# Patient Record
Sex: Female | Born: 1985 | Race: White | Hispanic: No | Marital: Married | Smoking: Current every day smoker
Health system: Southern US, Community
[De-identification: ages and names within clinical notes are randomized; demographics above are authoritative.]

## PROBLEM LIST (undated history)

## (undated) ENCOUNTER — Ambulatory Visit (HOSPITAL_COMMUNITY): Admission: EM | Payer: Medicaid Other | Source: Home / Self Care

## (undated) DIAGNOSIS — M542 Cervicalgia: Secondary | ICD-10-CM

## (undated) DIAGNOSIS — F419 Anxiety disorder, unspecified: Secondary | ICD-10-CM

## (undated) DIAGNOSIS — F429 Obsessive-compulsive disorder, unspecified: Secondary | ICD-10-CM

## (undated) DIAGNOSIS — R443 Hallucinations, unspecified: Secondary | ICD-10-CM

## (undated) DIAGNOSIS — M797 Fibromyalgia: Secondary | ICD-10-CM

## (undated) DIAGNOSIS — F329 Major depressive disorder, single episode, unspecified: Secondary | ICD-10-CM

## (undated) DIAGNOSIS — F209 Schizophrenia, unspecified: Secondary | ICD-10-CM

## (undated) DIAGNOSIS — J189 Pneumonia, unspecified organism: Secondary | ICD-10-CM

## (undated) DIAGNOSIS — T43291A Poisoning by other antidepressants, accidental (unintentional), initial encounter: Secondary | ICD-10-CM

## (undated) DIAGNOSIS — G894 Chronic pain syndrome: Secondary | ICD-10-CM

## (undated) DIAGNOSIS — G43909 Migraine, unspecified, not intractable, without status migrainosus: Secondary | ICD-10-CM

## (undated) DIAGNOSIS — R110 Nausea: Secondary | ICD-10-CM

## (undated) DIAGNOSIS — M549 Dorsalgia, unspecified: Secondary | ICD-10-CM

## (undated) DIAGNOSIS — J309 Allergic rhinitis, unspecified: Secondary | ICD-10-CM

## (undated) DIAGNOSIS — J45909 Unspecified asthma, uncomplicated: Secondary | ICD-10-CM

## (undated) DIAGNOSIS — R569 Unspecified convulsions: Secondary | ICD-10-CM

## (undated) DIAGNOSIS — F319 Bipolar disorder, unspecified: Secondary | ICD-10-CM

## (undated) DIAGNOSIS — Z9889 Other specified postprocedural states: Secondary | ICD-10-CM

## (undated) DIAGNOSIS — Z8489 Family history of other specified conditions: Secondary | ICD-10-CM

## (undated) DIAGNOSIS — F32A Depression, unspecified: Secondary | ICD-10-CM

## (undated) DIAGNOSIS — J9601 Acute respiratory failure with hypoxia: Secondary | ICD-10-CM

## (undated) DIAGNOSIS — J339 Nasal polyp, unspecified: Secondary | ICD-10-CM

## (undated) DIAGNOSIS — N83209 Unspecified ovarian cyst, unspecified side: Secondary | ICD-10-CM

## (undated) DIAGNOSIS — F603 Borderline personality disorder: Secondary | ICD-10-CM

## (undated) DIAGNOSIS — G8929 Other chronic pain: Secondary | ICD-10-CM

## (undated) DIAGNOSIS — M1990 Unspecified osteoarthritis, unspecified site: Secondary | ICD-10-CM

## (undated) DIAGNOSIS — R112 Nausea with vomiting, unspecified: Secondary | ICD-10-CM

## (undated) HISTORY — DX: Allergic rhinitis, unspecified: J30.9

## (undated) HISTORY — DX: Migraine, unspecified, not intractable, without status migrainosus: G43.909

## (undated) HISTORY — PX: TUBAL LIGATION: SHX77

## (undated) HISTORY — DX: Poisoning by other antidepressants, accidental (unintentional), initial encounter: T43.291A

## (undated) HISTORY — DX: Acute respiratory failure with hypoxia: J96.01

## (undated) HISTORY — PX: ABDOMINAL HYSTERECTOMY: SHX81

## (undated) HISTORY — PX: WISDOM TOOTH EXTRACTION: SHX21

## (undated) HISTORY — DX: Nasal polyp, unspecified: J33.9

## (undated) HISTORY — DX: Dorsalgia, unspecified: M54.9

## (undated) HISTORY — PX: NASAL SINUS SURGERY: SHX719

## (undated) HISTORY — DX: Unspecified ovarian cyst, unspecified side: N83.209

## (undated) HISTORY — DX: Other chronic pain: G89.29

## (undated) HISTORY — DX: Cervicalgia: M54.2

## (undated) HISTORY — DX: Obsessive-compulsive disorder, unspecified: F42.9

## (undated) HISTORY — PX: OTHER SURGICAL HISTORY: SHX169

---
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
## 2004-11-11 ENCOUNTER — Emergency Department (HOSPITAL_COMMUNITY): Admission: EM | Admit: 2004-11-11 | Discharge: 2004-11-11 | Payer: Self-pay | Admitting: Family Medicine

## 2007-09-07 ENCOUNTER — Emergency Department (HOSPITAL_COMMUNITY): Admission: EM | Admit: 2007-09-07 | Discharge: 2007-09-07 | Payer: Self-pay | Admitting: Emergency Medicine

## 2007-09-07 ENCOUNTER — Ambulatory Visit: Payer: Self-pay | Admitting: *Deleted

## 2007-09-07 ENCOUNTER — Inpatient Hospital Stay (HOSPITAL_COMMUNITY): Admission: AD | Admit: 2007-09-07 | Discharge: 2007-09-08 | Payer: Self-pay | Admitting: *Deleted

## 2010-07-07 NOTE — Discharge Summary (Signed)
Carmen, Daniels NO.:  [id number]   MEDICAL RECORD NO.:  [id number]          PATIENT TYPE:  IPS   LOCATION:  0606                          FACILITY:  [facility]   PHYSICIAN:  Jasmine Pang, M.D. DATE OF BIRTH:  1985/09/17   DATE OF ADMISSION:  09/07/2007  DATE OF DISCHARGE:  09/08/2007                               DISCHARGE SUMMARY   IDENTIFYING INFORMATION:  A 25 year old Caucasian female, single.  This  is a voluntary admission.   HISTORY OF PRESENT ILLNESS:  First [facility] admission for Carmen Daniels, who  presented after cutting her left forearm with an Exacto knife deep  enough to require suturing in the emergency room.  She denies that there  was any particular triggering stressor causing her to cut at this time  and denies that she had suicidal intent, but she does endorse a history  of mood swings and says she is not exactly sure what triggered the  cutting this time.  She reports that she has been cutting on herself  about one to two times a month ever since she was about 25 years old.  Family was unaware of it because she usually wears long sleeves and only  cuts on her left forearm.  She reports a history of depression with mood  swings, a lot of low energy, and typically will sleep 12 hours per day.  If she gets less than 10 hours of sleep per day she says she feels  terrible.  Even with 10-12 hours per day she still has problems with  lethargy and low energy.  At the same time she experiences quite a bit  of anxiety, has difficulty going out into public, also difficulty  leaving the home, getting very anxious.  Has been working with Dr. Tiajuana Amass, MD, on medications and he wanted to start Lamictal and  Depakote but she was afraid of the side effects.  He has recently  prescribed Xanax 1 mg b.i.d. p.r.n. for anxiety, which she has been  taking.  She drank two beers prior to the laceration and says she  typically drinks one to two beers about twice a  month.  Denies any other  current or past history of substance abuse.  She denies any suicidal  intent.   PAST PSYCHIATRIC HISTORY:  Currently followed as an outpatient by Dr.  Tiajuana Amass and has been previously diagnosed with bipolar disorder  by Dr. Tomasa Rand along with social anxiety and OCD.  She does validate  that she has mood swings with occasional episodes of irritability that  come out of the blue but mostly a lot of lethargy, anhedonia, low  energy.  Was previously on Prozac for a 87-month trial but says that it  did not help her so it was discontinued and at that time she was  prescribed Xanax 1 mg b.i.d. p.r.n., which she is currently taking.  In  the past she has also had trials of Wellbutrin, which made her feel like  a zombie, Lexapro, which did nothing for her, and Zoloft, which made her  sleepy.  She denies any  prior suicide attempts.  Denies suicidal intent.  She has been in counseling in the distant past for several months but is  not currently in a counseling relationship.  She denies any history of  sexual or physical abuse.   SOCIAL HISTORY:  Made good grades in school.  No history of learning  disability.  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  Currently in a supportive relationship with her  boyfriend, who plans to enter the [employer].  She has a two siblings, a  younger sister who is a Consulting civil engineer at [employer] in an older sister who is  graduating from med school at [employer].  Her father is a  retired professor Licensed conveyancer.  Both parents live here in [place].   FAMILY HISTORY:  Father with history of severe mood swings.  Mother with  history of anxiety issues.   MEDICAL HISTORY:  No regular primary care [facility].   CURRENT MEDICAL PROBLEMS:  Laceration of the left arm.   CURRENT MEDICATIONS:  Xanax 1 mg b.i.d. p.r.n. for anxiety, which she  is  using typically once a day.   DRUG ALLERGIES:  CEPHALOSPORINS, which cause anaphylaxis.   PHYSICAL EXAMINATION:  PE done in the emergency room.  The patient had a  for a 4-1/2-inch laceration to her left arm closed with Prolene sutures.  Physical exam is generally normal, a healthy but pale-appearing  Caucasian female in no distress with flat affect.  Normal vital signs.   Diagnostic studies were remarkable for a urine drug screen positive for  benzodiazepines and an alcohol level of 26.   MENTAL STATUS EXAM:  Fully alert female, pleasant, cooperative.  Blunted  affect.  Normal motor.  Speech is soft in tone but otherwise normal.  Gives a coherent history.  Is articulate.  Insight is good.  Gives a  pretty clear description of her symptoms.  Mood is depressed.  Thought  process is logical, coherent.  Insight seems good.  She has been quite  concerned about looking at the side effects of the various medications  and has been talking to her sister, who is in med school, about this.  She denies she ever had suicidal intent before or after cutting and has  no suicidal intent today really to harm herself but admits that she has  had significant episodes of depressed mood.  Her cognition is fully  intact.  No delusional statements made, no signs of internal distraction  or evidence of psychosis.  Denying any substance abuse.   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  AXIS II:  Deferred.  AXIS III: Laceration, left arm, sutured.  AXIS IV: Severe issues with unemployment.  AXIS V:  Current 40, past year not known.   PLAN:  To discharge her today.  Our case manager has talked with her  parents and they are planning to take her home with them for the next  couple of weeks.  We are getting her a counseling appointment and she  will follow up with Dr. Tiajuana Amass also this week.  In addition to  that, after discussing the risks and benefits of the various medications   she has agreed to start Lamictal 25 mg today and she will get her first  dose here today and will take a prescription for 30 mg home.  She can  continue the Xanax but no  prescription was written today.  She will be  discharged today to go home with her parents.      Margaret A. Lorin Picket, N.P.      Jasmine Pang, M.D.  Electronically Signed    MAS/MEDQ  D:  09/08/2007  T:  09/08/2007  Job:  [id number]

## 2010-11-20 LAB — RAPID URINE DRUG SCREEN, HOSP PERFORMED
Amphetamines: NOT DETECTED
Barbiturates: NOT DETECTED
Benzodiazepines: POSITIVE — AB
Cocaine: NOT DETECTED
Opiates: NOT DETECTED
Tetrahydrocannabinol: NOT DETECTED

## 2010-11-20 LAB — URINALYSIS, ROUTINE W REFLEX MICROSCOPIC
Bilirubin Urine: NEGATIVE
Glucose, UA: NEGATIVE
Hgb urine dipstick: NEGATIVE
Ketones, ur: NEGATIVE
Nitrite: NEGATIVE
Protein, ur: NEGATIVE
Specific Gravity, Urine: 1.006
Urobilinogen, UA: 0.2
pH: 6.5

## 2010-11-20 LAB — POCT I-STAT, CHEM 8
BUN: 5 — ABNORMAL LOW
Calcium, Ion: 1.26
Chloride: 104
Creatinine, Ser: 0.8
Glucose, Bld: 69 — ABNORMAL LOW
HCT: 44
Hemoglobin: 15
Potassium: 3.6
Sodium: 142
TCO2: 27

## 2010-11-20 LAB — CBC
HCT: 42.3
Hemoglobin: 14.8
MCV: 89.6
RBC: 4.73
WBC: 6.4

## 2010-11-20 LAB — POCT PREGNANCY, URINE
Operator id: 24446
Preg Test, Ur: NEGATIVE

## 2010-11-20 LAB — DIFFERENTIAL
Eosinophils Absolute: 0.1
Eosinophils Relative: 2
Lymphs Abs: 1.2
Monocytes Relative: 4

## 2010-11-20 LAB — ETHANOL: Alcohol, Ethyl (B): 26 — ABNORMAL HIGH

## 2013-04-04 ENCOUNTER — Other Ambulatory Visit: Payer: Self-pay | Admitting: Neurology

## 2013-04-04 DIAGNOSIS — R519 Headache, unspecified: Secondary | ICD-10-CM

## 2013-04-04 DIAGNOSIS — R51 Headache: Principal | ICD-10-CM

## 2013-04-12 ENCOUNTER — Ambulatory Visit
Admission: RE | Admit: 2013-04-12 | Discharge: 2013-04-12 | Disposition: A | Discharge status: Home / Self Care | Payer: No Typology Code available for payment source | Source: Ambulatory Visit | Attending: Neurology | Admitting: Neurology

## 2013-04-12 ENCOUNTER — Other Ambulatory Visit: Payer: Self-pay | Admitting: Neurology

## 2013-04-12 ENCOUNTER — Other Ambulatory Visit: Payer: Self-pay

## 2013-04-12 DIAGNOSIS — R51 Headache: Principal | ICD-10-CM

## 2013-04-12 DIAGNOSIS — R519 Headache, unspecified: Secondary | ICD-10-CM

## 2013-05-01 ENCOUNTER — Encounter (HOSPITAL_BASED_OUTPATIENT_CLINIC_OR_DEPARTMENT_OTHER): Payer: Self-pay | Admitting: Emergency Medicine

## 2013-05-01 ENCOUNTER — Emergency Department (HOSPITAL_BASED_OUTPATIENT_CLINIC_OR_DEPARTMENT_OTHER)
Admission: EM | Admit: 2013-05-01 | Discharge: 2013-05-02 | Disposition: A | Discharge status: Home / Self Care | Payer: BC Managed Care – PPO | Attending: Emergency Medicine | Admitting: Emergency Medicine

## 2013-05-01 DIAGNOSIS — R197 Diarrhea, unspecified: Secondary | ICD-10-CM | POA: Insufficient documentation

## 2013-05-01 DIAGNOSIS — R109 Unspecified abdominal pain: Secondary | ICD-10-CM | POA: Insufficient documentation

## 2013-05-01 DIAGNOSIS — F411 Generalized anxiety disorder: Secondary | ICD-10-CM | POA: Insufficient documentation

## 2013-05-01 DIAGNOSIS — F319 Bipolar disorder, unspecified: Secondary | ICD-10-CM | POA: Insufficient documentation

## 2013-05-01 DIAGNOSIS — R112 Nausea with vomiting, unspecified: Secondary | ICD-10-CM | POA: Insufficient documentation

## 2013-05-01 DIAGNOSIS — F17200 Nicotine dependence, unspecified, uncomplicated: Secondary | ICD-10-CM | POA: Insufficient documentation

## 2013-05-01 DIAGNOSIS — R1110 Vomiting, unspecified: Secondary | ICD-10-CM

## 2013-05-01 DIAGNOSIS — Z3202 Encounter for pregnancy test, result negative: Secondary | ICD-10-CM | POA: Insufficient documentation

## 2013-05-01 HISTORY — DX: Bipolar disorder, unspecified: F31.9

## 2013-05-01 HISTORY — DX: Nausea: R11.0

## 2013-05-01 HISTORY — DX: Anxiety disorder, unspecified: F41.9

## 2013-05-01 LAB — BASIC METABOLIC PANEL
BUN: 13 mg/dL (ref 6–23)
CO2: 20 mEq/L (ref 19–32)
CREATININE: 0.8 mg/dL (ref 0.50–1.10)
Calcium: 9.2 mg/dL (ref 8.4–10.5)
Chloride: 99 mEq/L (ref 96–112)
Glucose, Bld: 135 mg/dL — ABNORMAL HIGH (ref 70–99)
POTASSIUM: 3.5 meq/L — AB (ref 3.7–5.3)
Sodium: 136 mEq/L — ABNORMAL LOW (ref 137–147)

## 2013-05-01 MED ORDER — DICYCLOMINE HCL 10 MG/ML IM SOLN
20.0000 mg | Freq: Once | INTRAMUSCULAR | Status: AC
  Administered 2013-05-02: 20 mg via INTRAMUSCULAR
  Filled 2013-05-01: qty 2

## 2013-05-01 MED ORDER — SODIUM CHLORIDE 0.9 % IV SOLN: Freq: Once | INTRAVENOUS | Status: DC

## 2013-05-01 MED ORDER — ONDANSETRON HCL 4 MG/2ML IJ SOLN
4.0000 mg | Freq: Once | INTRAMUSCULAR | Status: AC
  Administered 2013-05-01: 4 mg via INTRAVENOUS
  Filled 2013-05-01: qty 2

## 2013-05-01 MED ORDER — PROMETHAZINE HCL 25 MG/ML IJ SOLN
25.0000 mg | Freq: Once | INTRAMUSCULAR | Status: AC
  Administered 2013-05-01: 25 mg via INTRAVENOUS
  Filled 2013-05-01: qty 1

## 2013-05-01 MED ORDER — KETOROLAC TROMETHAMINE 30 MG/ML IJ SOLN
30.0000 mg | Freq: Once | INTRAMUSCULAR | Status: AC
  Administered 2013-05-02: 30 mg via INTRAVENOUS
  Filled 2013-05-01: qty 2

## 2013-05-01 MED ORDER — SODIUM CHLORIDE 0.9 % IV SOLN
Freq: Once | INTRAVENOUS | Status: AC
  Administered 2013-05-01: 23:00:00 via INTRAVENOUS

## 2013-05-01 MED ORDER — KETOROLAC TROMETHAMINE 15 MG/ML IJ SOLN
INTRAMUSCULAR | Status: AC
  Administered 2013-05-01: 30 mg
  Filled 2013-05-01: qty 1

## 2013-05-01 MED ORDER — SODIUM CHLORIDE 0.9 % IV SOLN
Freq: Once | INTRAVENOUS | Status: AC
  Administered 2013-05-01: 1000 mL via INTRAVENOUS

## 2013-05-01 NOTE — ED Notes (Addendum)
n/v/d/ since 6am-generalized pain-was taking flagyl thru last night for BV

## 2013-05-01 NOTE — ED Notes (Signed)
Pt went to bathroom to collect urine specimen but she was not able to void.  Is now having a "panic attack" and is requesting Xanax, which she sts is what she usually takes.

## 2013-05-01 NOTE — ED Notes (Signed)
Pt denies urge to void at this time-triage delayed due to pt in ED [facility]-states she had episode diarrhea

## 2013-05-01 NOTE — ED Provider Notes (Signed)
CSN: [id number]     Arrival date & time 05/01/13  2157 History  This chart was scribed for Murna Backer Alfonso Patten, MD by Maree Erie, ED Scribe. The patient was seen in room MH12/MH12. Patient's care was started at 11:33 PM.    Chief Complaint  Patient presents with  . Emesis     Patient is a 28 y.o. female presenting with vomiting. The history is provided by the patient. No language interpreter was used.  Emesis Duration:  1 day Timing:  Constant Number of daily episodes:  5+ Quality:  Stomach contents Progression:  Improving Chronicity:  New Relieved by:  Antiemetics Associated symptoms: abdominal pain and diarrhea   Diarrhea:    Quality:  Watery   Number of occurrences:  5+   Severity:  Severe   Timing:  Constant   Progression:  Unchanged Risk factors: no suspect food intake     HPI Comments: ARLIS EVERLY is a 28 y.o. female who presents to the Emergency Department complaining of constant emesis that began this morning. She has associated diarrhea and abdominal pain. She states the episodes of each were "too numerous to count" but reports it was at least over five for each. She reports relief from emesis with the Zofran given in the ED. She denies fever. She denies sick contacts at home. Patient is requesting medication for her anxiety.   Past Medical History  Diagnosis Date  . Bipolar disorder   . Anxiety   . Nausea    Past Surgical History  Procedure Laterality Date  . Nasal sinus surgery     No family history on file. History  Substance Use Topics  . Smoking status: Current Every Day Smoker  . Smokeless tobacco: Not on file  . Alcohol Use: Yes   OB History   Grav Para Term Preterm Abortions TAB SAB Ect Mult Living                 Review of Systems  Constitutional: Negative for fever.  Gastrointestinal: Positive for nausea, vomiting, abdominal pain and diarrhea.  Genitourinary: Negative for dysuria.  Psychiatric/Behavioral: The patient is  nervous/anxious.   All other systems reviewed and are negative.      Allergies  Flagyl and Keflex  Home Medications   Current Outpatient Rx  Name  Route  Sig  Dispense  Refill  . ALPRAZOLAM PO   Oral   Take by mouth.         . ARIPiprazole (ABILIFY PO)   Oral   Take by mouth.         Marland Kitchen HYDROcodone-acetaminophen (NORCO) 10-325 MG per tablet   Oral   Take 1 tablet by mouth every 6 (six) hours as needed.         Marland Kitchen HYDROXYZINE HCL PO   Oral   Take by mouth.         Marland Kitchen PROMETHAZINE HCL PO   Oral   Take by mouth.         . SUMATRIPTAN SUCCINATE PO   Oral   Take by mouth.         . Topiramate (TOPAMAX PO)   Oral   Take by mouth.          Triage Vitals: BP 114/61  Pulse 114  Temp(Src) 98.9 F (37.2 C) (Oral)  Resp 18  Ht 5\' 11"  (1.803 m)  Wt 160 lb (72.576 kg)  BMI 22.33 kg/m2  SpO2 100%  LMP 04/12/2013  Physical Exam  Nursing  note and vitals reviewed. Constitutional: She is oriented to person, place, and time. She appears well-developed and well-nourished. No distress.  HENT:  Head: Normocephalic and atraumatic.  Mouth/Throat: Oropharynx is clear and moist. No oropharyngeal exudate.  Eyes: EOM are normal. Pupils are equal, round, and reactive to light.  Neck: Normal range of motion. Neck supple.  Cardiovascular: Normal rate and regular rhythm.   Pulmonary/Chest: Effort normal and breath sounds normal. No respiratory distress. She has no wheezes. She has no rales.  Abdominal: Soft. She exhibits no distension. Bowel sounds are increased. There is no tenderness. There is no rebound and no guarding.  Musculoskeletal: Normal range of motion. She exhibits no edema.  Neurological: She is alert and oriented to person, place, and time.  Skin: Skin is warm and dry.  Psychiatric: Her mood appears anxious.    ED Course  Procedures (including critical care time)  DIAGNOSTIC STUDIES: Oxygen Saturation is 100% on room air, normal by my interpretation.     COORDINATION OF CARE: 11:36 PM - Patient verbalizes understanding and agrees with treatment plan.    Labs Review Labs Reviewed  URINALYSIS, ROUTINE W REFLEX MICROSCOPIC  PREGNANCY, URINE   Imaging Review No results found.   EKG Interpretation None      MDM   Final diagnoses:  None  Exam and vitals and labs consistent with viral n/v/d.  No indication for imaging at this time  Vomiting has stopped have advised diet for diarrhea.    I personally performed the services described in this documentation, which was scribed in my presence. The recorded information has been reviewed and is accurate.     Carlisle Beers, MD 05/02/13 [phone]

## 2013-05-01 NOTE — ED Notes (Signed)
N/v diarrhea onset this am,  abd pain, denies burning w urination,  Denies vag discharge,  Generalized body aches

## 2013-05-02 LAB — URINALYSIS, ROUTINE W REFLEX MICROSCOPIC
Bilirubin Urine: NEGATIVE
Glucose, UA: NEGATIVE mg/dL
Hgb urine dipstick: NEGATIVE
KETONES UR: 15 mg/dL — AB
NITRITE: NEGATIVE
PH: 6 (ref 5.0–8.0)
Protein, ur: NEGATIVE mg/dL
Specific Gravity, Urine: 1.022 (ref 1.005–1.030)
Urobilinogen, UA: 0.2 mg/dL (ref 0.0–1.0)

## 2013-05-02 LAB — URINE MICROSCOPIC-ADD ON

## 2013-05-02 LAB — PREGNANCY, URINE: PREG TEST UR: NEGATIVE

## 2013-05-02 MED ORDER — ONDANSETRON 8 MG PO TBDP: ORAL_TABLET | ORAL | Status: DC

## 2013-06-14 ENCOUNTER — Emergency Department (HOSPITAL_BASED_OUTPATIENT_CLINIC_OR_DEPARTMENT_OTHER)
Admission: EM | Admit: 2013-06-14 | Discharge: 2013-06-14 | Disposition: A | Discharge status: Home / Self Care | Payer: BC Managed Care – PPO | Attending: Emergency Medicine | Admitting: Emergency Medicine

## 2013-06-14 ENCOUNTER — Encounter (HOSPITAL_BASED_OUTPATIENT_CLINIC_OR_DEPARTMENT_OTHER): Payer: Self-pay | Admitting: Emergency Medicine

## 2013-06-14 DIAGNOSIS — R110 Nausea: Secondary | ICD-10-CM | POA: Insufficient documentation

## 2013-06-14 DIAGNOSIS — F319 Bipolar disorder, unspecified: Secondary | ICD-10-CM | POA: Insufficient documentation

## 2013-06-14 DIAGNOSIS — F17200 Nicotine dependence, unspecified, uncomplicated: Secondary | ICD-10-CM | POA: Insufficient documentation

## 2013-06-14 DIAGNOSIS — Z3202 Encounter for pregnancy test, result negative: Secondary | ICD-10-CM | POA: Insufficient documentation

## 2013-06-14 DIAGNOSIS — J029 Acute pharyngitis, unspecified: Secondary | ICD-10-CM | POA: Insufficient documentation

## 2013-06-14 DIAGNOSIS — F411 Generalized anxiety disorder: Secondary | ICD-10-CM | POA: Insufficient documentation

## 2013-06-14 LAB — CBC WITH DIFFERENTIAL/PLATELET
Basophils Absolute: 0 10*3/uL (ref 0.0–0.1)
Basophils Relative: 0 % (ref 0–1)
EOS PCT: 1 % (ref 0–5)
Eosinophils Absolute: 0.1 10*3/uL (ref 0.0–0.7)
HEMATOCRIT: 38 % (ref 36.0–46.0)
HEMOGLOBIN: 13.3 g/dL (ref 12.0–15.0)
LYMPHS ABS: 1.2 10*3/uL (ref 0.7–4.0)
LYMPHS PCT: 12 % (ref 12–46)
MCH: 31.8 pg (ref 26.0–34.0)
MCHC: 35 g/dL (ref 30.0–36.0)
MCV: 90.9 fL (ref 78.0–100.0)
MONO ABS: 0.6 10*3/uL (ref 0.1–1.0)
MONOS PCT: 6 % (ref 3–12)
Neutro Abs: 7.8 10*3/uL — ABNORMAL HIGH (ref 1.7–7.7)
Neutrophils Relative %: 81 % — ABNORMAL HIGH (ref 43–77)
Platelets: 227 10*3/uL (ref 150–400)
RBC: 4.18 MIL/uL (ref 3.87–5.11)
RDW: 12.6 % (ref 11.5–15.5)
WBC: 9.6 10*3/uL (ref 4.0–10.5)

## 2013-06-14 LAB — COMPREHENSIVE METABOLIC PANEL
ALT: 22 U/L (ref 0–35)
AST: 25 U/L (ref 0–37)
Albumin: 4.5 g/dL (ref 3.5–5.2)
Alkaline Phosphatase: 54 U/L (ref 39–117)
BUN: 9 mg/dL (ref 6–23)
CALCIUM: 9.4 mg/dL (ref 8.4–10.5)
CO2: 20 meq/L (ref 19–32)
CREATININE: 0.7 mg/dL (ref 0.50–1.10)
Chloride: 105 mEq/L (ref 96–112)
GLUCOSE: 75 mg/dL (ref 70–99)
Potassium: 4 mEq/L (ref 3.7–5.3)
SODIUM: 139 meq/L (ref 137–147)
Total Bilirubin: 1.2 mg/dL (ref 0.3–1.2)
Total Protein: 7.4 g/dL (ref 6.0–8.3)

## 2013-06-14 LAB — URINALYSIS, ROUTINE W REFLEX MICROSCOPIC
BILIRUBIN URINE: NEGATIVE
Glucose, UA: NEGATIVE mg/dL
HGB URINE DIPSTICK: NEGATIVE
Ketones, ur: NEGATIVE mg/dL
Leukocytes, UA: NEGATIVE
Nitrite: NEGATIVE
PH: 7.5 (ref 5.0–8.0)
Protein, ur: NEGATIVE mg/dL
SPECIFIC GRAVITY, URINE: 1.003 — AB (ref 1.005–1.030)
Urobilinogen, UA: 0.2 mg/dL (ref 0.0–1.0)

## 2013-06-14 LAB — PREGNANCY, URINE: Preg Test, Ur: NEGATIVE

## 2013-06-14 LAB — RAPID STREP SCREEN (MED CTR MEBANE ONLY): Streptococcus, Group A Screen (Direct): NEGATIVE

## 2013-06-14 LAB — ACETAMINOPHEN LEVEL: Acetaminophen (Tylenol), Serum: <15 ug/mL (ref 10–30)

## 2013-06-14 LAB — MONONUCLEOSIS SCREEN: Mono Screen: NEGATIVE

## 2013-06-14 MED ORDER — HYDROCODONE-HOMATROPINE 5-1.5 MG/5ML PO SYRP: 5.0000 mL | ORAL_SOLUTION | Freq: Four times a day (QID) | ORAL | Status: DC | PRN

## 2013-06-14 MED ORDER — SODIUM CHLORIDE 0.9 % IV BOLUS (SEPSIS)
1000.0000 mL | Freq: Once | INTRAVENOUS | Status: AC
  Administered 2013-06-14: 1000 mL via INTRAVENOUS

## 2013-06-14 MED ORDER — ONDANSETRON HCL 4 MG/2ML IJ SOLN
4.0000 mg | Freq: Once | INTRAMUSCULAR | Status: AC
  Administered 2013-06-14: 4 mg via INTRAVENOUS
  Filled 2013-06-14: qty 2

## 2013-06-14 NOTE — Discharge Instructions (Signed)
Pharyngitis °Pharyngitis is redness, pain, and swelling (inflammation) of your pharynx.  °CAUSES  °Pharyngitis is usually caused by infection. Most of the time, these infections are from viruses (viral) and are part of a cold. However, sometimes pharyngitis is caused by bacteria (bacterial). Pharyngitis can also be caused by allergies. Viral pharyngitis may be spread from person to person by coughing, sneezing, and personal items or utensils (cups, forks, spoons, toothbrushes). Bacterial pharyngitis may be spread from person to person by more intimate contact, such as kissing.  °SIGNS AND SYMPTOMS  °Symptoms of pharyngitis include:   °· Sore throat.   °· Tiredness (fatigue).   °· Low-grade fever.   °· Headache. °· Joint pain and muscle aches. °· Skin rashes. °· Swollen lymph nodes. °· Plaque-like film on throat or tonsils (often seen with bacterial pharyngitis). °DIAGNOSIS  °Your health care provider will ask you questions about your illness and your symptoms. Your medical history, along with a physical exam, is often all that is needed to diagnose pharyngitis. Sometimes, a rapid strep test is done. Other lab tests may also be done, depending on the suspected cause.  °TREATMENT  °Viral pharyngitis will usually get better in 3 4 days without the use of medicine. Bacterial pharyngitis is treated with medicines that kill germs (antibiotics).  °HOME CARE INSTRUCTIONS  °· Drink enough water and fluids to keep your urine clear or pale yellow.   °· Only take over-the-counter or prescription medicines as directed by your health care provider:   °· If you are prescribed antibiotics, make sure you finish them even if you start to feel better.   °· Do not take aspirin.   °· Get lots of rest.   °· Gargle with 8 oz of salt water (½ tsp of salt per 1 qt of water) as often as every 1 2 hours to soothe your throat.   °· Throat lozenges (if you are not at risk for choking) or sprays may be used to soothe your throat. °SEEK MEDICAL  CARE IF:  °· You have large, tender lumps in your neck. °· You have a rash. °· You cough up green, yellow-brown, or bloody spit. °SEEK IMMEDIATE MEDICAL CARE IF:  °· Your neck becomes stiff. °· You drool or are unable to swallow liquids. °· You vomit or are unable to keep medicines or liquids down. °· You have severe pain that does not go away with the use of recommended medicines. °· You have trouble breathing (not caused by a stuffy nose). °MAKE SURE YOU:  °· Understand these instructions. °· Will watch your condition. °· Will get help right away if you are not doing well or get worse. °Document Released: 02/08/2005 Document Revised: 11/29/2012 Document Reviewed: 10/16/2012 °[employer]® Patient Information ©2014 [employer]. ° °

## 2013-06-14 NOTE — ED Notes (Addendum)
Nausea and vomiting last night dizziness this am. Sore throat x 3 days. States she has been taking Tylenol x 4 po every 6-8 hours for the past 3 days.

## 2013-06-14 NOTE — ED Notes (Signed)
Pt concerned for tylenol poisoning due to taking 4 extra strength tylenol every 4-6 hours x 3 days.

## 2013-06-14 NOTE — ED Provider Notes (Signed)
CSN: [id number]     Arrival date & time 06/14/13  1503 History   First MD Initiated Contact with Patient 06/14/13 1518     Chief Complaint  Patient presents with  . Dizziness  . Nausea     (Consider location/radiation/quality/duration/timing/severity/associated sxs/prior Treatment) Patient is a 28 y.o. female presenting with dizziness.  Dizziness  Pt reports she has had a 'severe' sore throat for the last 3 days, she began to feel nauseated and dizzy yesterday which is not unusual for her. She vomited once last night but none today. She has been taking 2g APAP every 4-6 hours since the sore throat started and now she is concerned about APAP toxicity. She looked up symptoms on WebMD and she 'has all of them'.   Past Medical History  Diagnosis Date  . Bipolar disorder   . Anxiety   . Nausea    Past Surgical History  Procedure Laterality Date  . Nasal sinus surgery     No family history on file. History  Substance Use Topics  . Smoking status: Current Every Day Smoker  . Smokeless tobacco: Not on file  . Alcohol Use: Yes   OB History   Grav Para Term Preterm Abortions TAB SAB Ect Mult Living                 Review of Systems  Neurological: Positive for dizziness.   All other systems reviewed and are negative except as noted in HPI.     Allergies  Flagyl and Keflex  Home Medications   Prior to Admission medications   Medication Sig Start Date End Date Taking? Authorizing Provider  ALPRAZOLAM PO Take by mouth.    Historical Provider, MD  ARIPiprazole (ABILIFY PO) Take by mouth.    Historical Provider, MD  HYDROcodone-acetaminophen (NORCO) 10-325 MG per tablet Take 1 tablet by mouth every 6 (six) hours as needed.    Historical Provider, MD  HYDROXYZINE HCL PO Take by mouth.    Historical Provider, MD  ondansetron (ZOFRAN ODT) 8 MG disintegrating tablet 8mg  ODT q8 hours prn nausea 05/02/13   April K Palumbo-Rasch, MD  PROMETHAZINE HCL PO Take by mouth.    Historical  Provider, MD  SUMATRIPTAN SUCCINATE PO Take by mouth.    Historical Provider, MD  Topiramate (TOPAMAX PO) Take by mouth.    Historical Provider, MD   BP 118/69  Pulse 110  Temp(Src) 98.1 F (36.7 C) (Oral)  Resp 18  Ht 5\' 11"  (1.803 m)  Wt 155 lb (70.308 kg)  BMI 21.63 kg/m2  SpO2 96%  LMP 06/13/2013 Physical Exam  Nursing note and vitals reviewed. Constitutional: She is oriented to person, place, and time. She appears well-developed and well-nourished.  HENT:  Head: Normocephalic and atraumatic.  Mouth/Throat: Oropharyngeal exudate present.  Posterior pharynx red swollen  Eyes: EOM are normal. Pupils are equal, round, and reactive to light. No scleral icterus.  Neck: Normal range of motion. Neck supple.  Cardiovascular: Normal rate, normal heart sounds and intact distal pulses.   Pulmonary/Chest: Effort normal and breath sounds normal.  Abdominal: Bowel sounds are normal. She exhibits no distension. There is no tenderness.  Musculoskeletal: Normal range of motion. She exhibits no edema and no tenderness.  Lymphadenopathy:    She has no cervical adenopathy.  Neurological: She is alert and oriented to person, place, and time. She has normal strength. No cranial nerve deficit or sensory deficit.  Skin: Skin is warm and dry. No rash noted.  Psychiatric: She  has a normal mood and affect.    ED Course  Procedures (including critical care time) Labs Review Labs Reviewed  URINALYSIS, ROUTINE W REFLEX MICROSCOPIC - Abnormal; Notable for the following:    Specific Gravity, Urine 1.003 (*)    All other components within normal limits  CBC WITH DIFFERENTIAL - Abnormal; Notable for the following:    Neutrophils Relative % 81 (*)    Neutro Abs 7.8 (*)    All other components within normal limits  RAPID STREP SCREEN  CULTURE, GROUP A STREP  PREGNANCY, URINE  COMPREHENSIVE METABOLIC PANEL  ACETAMINOPHEN LEVEL  MONONUCLEOSIS SCREEN    Imaging Review No results found.   EKG  Interpretation None      MDM   Final diagnoses:  Pharyngitis    Labs reviewed and neg. No evidence of APAP toxicity. Advised to avoid taking more than recommended dosage. Will d/c with hydrocodone syrup for sore throat. PCP followup.    Charles B. Karle Starch, MD 06/14/13 1701

## 2013-06-16 LAB — CULTURE, GROUP A STREP

## 2014-09-26 ENCOUNTER — Encounter: Payer: Self-pay | Admitting: *Deleted

## 2014-09-26 ENCOUNTER — Encounter: Payer: Self-pay | Admitting: Neurology

## 2014-09-26 ENCOUNTER — Ambulatory Visit (INDEPENDENT_AMBULATORY_CARE_PROVIDER_SITE_OTHER): Payer: BLUE CROSS/BLUE SHIELD | Admitting: Neurology

## 2014-09-26 VITALS — BP 119/67 | HR 77 | Ht 71.0 in | Wt 187.0 lb

## 2014-09-26 DIAGNOSIS — E237 Disorder of pituitary gland, unspecified: Secondary | ICD-10-CM

## 2014-09-26 DIAGNOSIS — G43119 Migraine with aura, intractable, without status migrainosus: Secondary | ICD-10-CM | POA: Diagnosis not present

## 2014-09-26 MED ORDER — SUMATRIPTAN SUCCINATE 100 MG PO TABS: 100.0000 mg | ORAL_TABLET | Freq: Once | ORAL | Status: DC

## 2014-09-26 NOTE — Progress Notes (Signed)
PATIENT: Carmen Daniels DOB: November 09, 1985  Chief Complaint  Patient presents with  . Migraine    She is here with her mother, Manuela Schwartz, to discuss migraine treatment. Reports getting approximately 4 migraines per week.  Her migraines are accompanied by nausea, vomiting, blurred vision and dizziness.  She uses Topamax 175mg  qhs and sumatriptan prn.  The sumatriptan is helpful but she runs out of the 9 tablets per month.     HISTORICAL  Carmen Daniels is a 29 years old right-handed female, accompanied by her mother,, seen in refer by her pain management doctor Dr. Nelva Bush for evaluation of migraines  I have reviewed most recent office note in April 2016, she has past medical history of bipolar disorder, complains of frequent headaches, diffuse body achy pain UDS was negative,   She complains of migraine headaches since 2011, her typical migraine started with tunnel vision, lasting for 10-15 minutes, followed by severe pounding headache with associated light noise sensitivity, sumatriptan 100 mg as needed has been very helpful, but sometimes she require repeat dosage. She is now having 4-5 typical prolonged migraines each week, lasting for 1 day,  Trigger for her migraines are stress, sleep deprivation,  She has tried over-the-counter aspirin, Tylenol, NSAIDs Excedrin Migraine, without helping her headaches, she has seen different neurologists in the past, has tried different preventive medications, including beta blocker, calcium channel blocker without helping, currently she is taking Topamax 25 mg 7 to 8 tablets every night, which is adjusted by her psychologist Dr. 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 No white matter disease or large vessel/lacunar  infarc  REVIEW OF SYSTEMS: Full 14 system review of systems performed and notable only for fatigue, blurred vision, headaches, numbness, weakness, dizziness, insomnia, sleepiness, snoring,  ALLERGIES: Allergies  Allergen Reactions  . Keflex [Cephalexin] Anaphylaxis  . Flagyl [Metronidazole]     HOME MEDICATIONS: Current Outpatient Prescriptions  Medication Sig Dispense Refill  . ALPRAZolam (XANAX) 1 MG tablet Take 1 mg by mouth 4 (four) times daily.  2  . ARIPiprazole (ABILIFY) 30 MG tablet Take 30 mg by mouth daily.  3  . Eszopiclone 3 MG TABS Take 3 mg by mouth at bedtime.  3  . HYDROcodone-acetaminophen (NORCO) 10-325 MG per tablet Take 1 tablet by mouth 2 (two) times daily as needed. for pain  0  . LATUDA 40 MG TABS tablet TAKE 1 TABLET EVERY EVENING WITH FOOD  1  . promethazine (PHENERGAN) 25 MG tablet Take 25 mg by mouth 3 (three) times daily as needed.  0  . QUEtiapine (SEROQUEL) 25 MG tablet Take 25 mg by mouth at bedtime.  2  . SUMAtriptan (IMITREX) 100 MG tablet TAKE 1 TABLET BY MOUTH. MAY TAKE REPEAT WITH 1 TABLET IN TWO HOURS IF NEEDED  5  . topiramate (TOPAMAX) 25 MG tablet TAKE 7-8 TABLETS AT BEDTIME  0     PAST MEDICAL HISTORY: Past Medical History  Diagnosis Date  . Bipolar disorder   . Anxiety   . Nausea   . Neck pain   . Migraines   . Chronic pain   . Back pain   . OCD (obsessive compulsive disorder)     PAST SURGICAL HISTORY: Past Surgical History  Procedure Laterality Date  . Nasal sinus surgery    .  Wisdom tooth extraction      FAMILY HISTORY: Family History  Problem Relation Age of Onset  . Arthritis Maternal Grandmother   . Arthritis Maternal Grandfather   . Depression Paternal Grandmother   . Healthy Father   . Healthy Mother     SOCIAL HISTORY:  History   Social History  . Marital Status: Married    Spouse Name: N/A  . Number of Children: 0  . Years of Education: 14   Occupational History  . Unemployed    Social History  Main Topics  . Smoking status: Current Every Day Smoker -- 0.50 packs/day    Types: Cigarettes  . Smokeless tobacco: Not on file  . Alcohol Use: 0.0 oz/week    0 Standard drinks or equivalent per week     Comment: Occasional alcohol use  . Drug Use: No  . Sexual Activity: Not on file   Other Topics Concern  . Not on file   Social History Narrative   Lives at home alone.   Right-handed.   3 glasses of green tea per day.       PHYSICAL EXAM   Filed Vitals:   09/26/14 1404  BP: 119/67  Pulse: 77  Height: 5\' 11"  (1.803 m)  Weight: 187 lb (84.823 kg)    Not recorded      Body mass index is 26.09 kg/(m^2).  PHYSICAL EXAMNIATION:  Gen: NAD, conversant, well nourised, obese, well groomed                     Cardiovascular: Regular rate rhythm, no peripheral edema, warm, nontender. Eyes: Conjunctivae clear without exudates or hemorrhage Neck: Supple, no carotid bruise. Pulmonary: Clear to auscultation bilaterally   NEUROLOGICAL EXAM:  MENTAL STATUS: Speech:    Speech is normal; fluent and spontaneous with normal comprehension.  Cognition:     Orientation to time, place and person     Normal recent and remote memory     Normal Attention span and concentration     Normal Language, naming, repeating,spontaneous speech     Fund of knowledge   CRANIAL NERVES: CN II: Visual fields are full to confrontation. Fundoscopic exam is normal with sharp discs and no vascular changes. Pupils are round equal and briskly reactive to light. CN III, IV, VI: extraocular movement are normal. No ptosis. CN V: Facial sensation is intact to pinprick in all 3 divisions bilaterally. Corneal responses are intact.  CN VII: Face is symmetric with normal eye closure and smile. CN VIII: Hearing is normal to rubbing fingers CN IX, X: Palate elevates symmetrically. Phonation is normal. CN XI: Head turning and shoulder shrug are intact CN XII: Tongue is midline with normal movements and no  atrophy.  MOTOR: There is no pronator drift of out-stretched arms. Muscle bulk and tone are normal. Muscle strength is normal.  REFLEXES: Reflexes are 2+ and symmetric at the biceps, triceps, knees, and ankles. Plantar responses are flexor.  SENSORY: Intact to light touch, pinprick, position sense, and vibration sense are intact in fingers and toes.  COORDINATION: Rapid alternating movements and fine finger movements are intact. There is no dysmetria on finger-to-nose and heel-knee-shin.    GAIT/STANCE: Posture is normal. Gait is steady with normal steps, base, arm swing, and turning. Heel and toe walking are normal. Tandem gait is normal.  Romberg is absent.   DIAGNOSTIC DATA (LABS, IMAGING, TESTING) - I reviewed patient records, labs, notes, testing and imaging myself where available.   ASSESSMENT  AND PLAN  Carmen Daniels is a 29 y.o. female    Chronic migraine headaches with aura  She has tried and failed multiple different preventive medications in the past, currently is taking Topamax, previously has failed beta blocker, calcium channel blocker  She is on polypharmacy treatment  Preauthorization for Botox as migraine prevention  Sumatriptan as needed Bipolar disorder    Marcial Pacas, M.D. Ph.D.  [facility] [address], [place], [place] [address] Ph: [phone] Fax: [phone]  CC: To Dr. [name], Dr. Nelva Bush, Delfino Lovett

## 2014-10-14 ENCOUNTER — Telehealth: Payer: Self-pay | Admitting: Neurology

## 2014-10-14 NOTE — Telephone Encounter (Signed)
error 

## 2014-10-18 ENCOUNTER — Telehealth: Payer: Self-pay | Admitting: Neurology

## 2014-10-18 NOTE — Telephone Encounter (Signed)
Called patient and to set up botox injection. Left a VM asking her to return my call.

## 2014-11-22 NOTE — Telephone Encounter (Signed)
Called patient again to setup botox apt. Left VM asking her to return call if she would like to schedule.

## 2014-11-24 ENCOUNTER — Emergency Department (HOSPITAL_BASED_OUTPATIENT_CLINIC_OR_DEPARTMENT_OTHER): Payer: BLUE CROSS/BLUE SHIELD

## 2014-11-24 ENCOUNTER — Encounter (HOSPITAL_BASED_OUTPATIENT_CLINIC_OR_DEPARTMENT_OTHER): Payer: Self-pay | Admitting: *Deleted

## 2014-11-24 ENCOUNTER — Emergency Department (HOSPITAL_BASED_OUTPATIENT_CLINIC_OR_DEPARTMENT_OTHER)
Admission: EM | Admit: 2014-11-24 | Discharge: 2014-11-24 | Disposition: A | Discharge status: Home / Self Care | Payer: BLUE CROSS/BLUE SHIELD | Attending: Physician Assistant | Admitting: Physician Assistant

## 2014-11-24 DIAGNOSIS — B370 Candidal stomatitis: Secondary | ICD-10-CM | POA: Diagnosis not present

## 2014-11-24 DIAGNOSIS — G8929 Other chronic pain: Secondary | ICD-10-CM | POA: Insufficient documentation

## 2014-11-24 DIAGNOSIS — Z79899 Other long term (current) drug therapy: Secondary | ICD-10-CM | POA: Insufficient documentation

## 2014-11-24 DIAGNOSIS — G43909 Migraine, unspecified, not intractable, without status migrainosus: Secondary | ICD-10-CM | POA: Diagnosis not present

## 2014-11-24 DIAGNOSIS — R0602 Shortness of breath: Secondary | ICD-10-CM | POA: Diagnosis present

## 2014-11-24 DIAGNOSIS — F419 Anxiety disorder, unspecified: Secondary | ICD-10-CM | POA: Insufficient documentation

## 2014-11-24 DIAGNOSIS — R0789 Other chest pain: Secondary | ICD-10-CM | POA: Diagnosis not present

## 2014-11-24 DIAGNOSIS — J45909 Unspecified asthma, uncomplicated: Secondary | ICD-10-CM | POA: Insufficient documentation

## 2014-11-24 DIAGNOSIS — F319 Bipolar disorder, unspecified: Secondary | ICD-10-CM | POA: Diagnosis not present

## 2014-11-24 DIAGNOSIS — Z720 Tobacco use: Secondary | ICD-10-CM | POA: Diagnosis not present

## 2014-11-24 HISTORY — DX: Unspecified asthma, uncomplicated: J45.909

## 2014-11-24 MED ORDER — IBUPROFEN 800 MG PO TABS
800.0000 mg | ORAL_TABLET | Freq: Once | ORAL | Status: AC
  Administered 2014-11-24: 800 mg via ORAL
  Filled 2014-11-24: qty 1

## 2014-11-24 MED ORDER — NYSTATIN 100000 UNIT/ML MT SUSP: 5.0000 mL | Freq: Four times a day (QID) | OROMUCOSAL | Status: AC

## 2014-11-24 NOTE — ED Provider Notes (Signed)
CSN: [id number]     Arrival date & time 11/24/14  1744 History   First MD Initiated Contact with Patient 11/24/14 2017     Chief Complaint  Patient presents with  . Shortness of Breath  . Chest Pain     (Consider location/radiation/quality/duration/timing/severity/associated sxs/prior Treatment) HPI Comments: Pt is a 29 yo female who presents to the ED with complaint of SOB and chest tightness. Pt reports she was at a trampoline park 3 days ago and fell multiple times, denies LOC or head injury. She states she felt a little sore with some chest tightness the next day. She then reports going to a haunted house 2 days ago and notes breathing in lots of smoke from the fog machines which caused her chest tightness to worsen and she began to feel SOB. She notes seh was seen at [facility] P[facility] and was given a neb tx which she notes did not help at all. Denies fever, chills, headache, cough, sore throat, wheezing, palpitations, abdominal pain, N/V/D, numbness, tingling, weakness. She reports the chest tightness worsened with deep breaths which has resulted in her taking quick small breaths and making her feel SOB.    Past Medical History  Diagnosis Date  . Bipolar disorder ([place])   . Anxiety   . Nausea   . Neck pain   . Migraines   . Chronic pain   . Back pain   . OCD (obsessive compulsive disorder)   . Asthma    Past Surgical History  Procedure Laterality Date  . Nasal sinus surgery    . Wisdom tooth extraction     Family History  Problem Relation Age of Onset  . Arthritis Maternal Grandmother   . Arthritis Maternal Grandfather   . Depression Paternal Grandmother   . Healthy Father   . Healthy Mother    Social History  Substance Use Topics  . Smoking status: Current Every Day Smoker -- 0.50 packs/day    Types: Cigarettes  . Smokeless tobacco: None  . Alcohol Use: 0.0 oz/week    0 Standard drinks or equivalent per week     Comment: Occasional alcohol use   OB History    No data  available     Review of Systems  Constitutional: Positive for diaphoresis.  Respiratory: Positive for chest tightness and shortness of breath.   All other systems reviewed and are negative.     Allergies  Keflex and Flagyl  Home Medications   Prior to Admission medications   Medication Sig Start Date End Date Taking? Authorizing Provider  ALPRAZolam Duanne Moron) 1 MG tablet Take 1 mg by mouth 4 (four) times daily. 09/16/14  Yes Historical Provider, MD  ARIPiprazole (ABILIFY) 30 MG tablet Take 30 mg by mouth daily. 08/27/14  Yes Historical Provider, MD  Eszopiclone 3 MG TABS Take 3 mg by mouth at bedtime. 08/24/14  Yes Historical Provider, MD  HYDROcodone-acetaminophen (NORCO) 10-325 MG per tablet Take 1 tablet by mouth 2 (two) times daily as needed. for pain 09/16/14  Yes Historical Provider, MD  LATUDA 40 MG TABS tablet TAKE 1 TABLET EVERY EVENING WITH FOOD 09/12/14  Yes Historical Provider, MD  promethazine (PHENERGAN) 25 MG tablet Take 25 mg by mouth 3 (three) times daily as needed. 09/16/14  Yes Historical Provider, MD  QUEtiapine (SEROQUEL) 25 MG tablet Take 25 mg by mouth at bedtime. 09/12/14  Yes Historical Provider, MD  SUMAtriptan (IMITREX) 100 MG tablet Take 1 tablet (100 mg total) by mouth once. May repeat in  2 hours if headache persists or recurs. 09/26/14  Yes Marcial Pacas, MD  topiramate (TOPAMAX) 25 MG tablet TAKE 7-8 TABLETS AT BEDTIME 09/12/14  Yes Historical Provider, MD   BP 130/91 mmHg  Pulse 68  Temp(Src) 98.2 F (36.8 C) (Oral)  Resp 20  Ht 5\' 11"  (1.803 m)  Wt 175 lb (79.379 kg)  BMI 24.42 kg/m2  SpO2 100%  LMP 11/14/2014 (Within Days) Physical Exam  Constitutional: She is oriented to person, place, and time. She appears well-developed and well-nourished. No distress.  HENT:  Head: Normocephalic and atraumatic.  Mouth/Throat: Oropharynx is clear and moist.  Few small white lesions noted to soft palate.   Eyes: Conjunctivae and EOM are normal. Right eye exhibits no  discharge. Left eye exhibits no discharge. No scleral icterus.  Neck: Normal range of motion. Neck supple.  Cardiovascular: Normal rate, regular rhythm, normal heart sounds and intact distal pulses.   Pulmonary/Chest: Effort normal and breath sounds normal. No respiratory distress. She has no wheezes. She has no rales. She exhibits tenderness (left chest wall and sterum TTP).  Abdominal: Soft. Bowel sounds are normal. She exhibits no distension and no mass. There is no tenderness. There is no rebound and no guarding.  Musculoskeletal: Normal range of motion. She exhibits no edema or tenderness.  Lymphadenopathy:    She has no cervical adenopathy.  Neurological: She is alert and oriented to person, place, and time.  Skin: Skin is warm and dry. She is not diaphoretic.  Nursing note and vitals reviewed.   ED Course  Procedures (including critical care time) Labs Review Labs Reviewed - No data to display  Imaging Review No results found. I have personally reviewed and evaluated these images and lab results as part of my medical decision-making.   EKG Interpretation   Date/Time:  Sunday November 24 2014 17:56:44 EDT Ventricular Rate:  113 PR Interval:  116 QRS Duration: 76 QT Interval:  346 QTC Calculation: 474 R Axis:   52 Text Interpretation:  Sinus tachycardia Minimal voltage criteria for LVH,  may be normal variant Borderline ECG no acute ischemia Sinus tachycardia  Confirmed by Thomasene Lot, [place] ([id number]) on 11/24/2014 6:02:08 PM     Filed Vitals:   11/24/14 2228  BP: 131/78  Pulse: 78  Temp:   Resp: 18   Meds given in ED:  Medications  ibuprofen (ADVIL,MOTRIN) tablet 800 mg (800 mg Oral Given 11/24/14 2108)    Discharge Medication List as of 11/24/2014 10:27 PM    START taking these medications   Details  nystatin (MYCOSTATIN) 100000 UNIT/ML suspension Take 5 mLs (500,000 Units total) by mouth 4 (four) times daily., Starting 11/24/2014, Until Sun 12/08/14, Print         MDM   Final diagnoses:  Musculoskeletal chest pain  Thrush    Pt presents with chest tightness and SOB. Reports going to trampoline house 3 days ago, beginning to have chest tightness and then breathing in smoke from fog machine at haunted house yesterday which worsened her chest tightness and SOB. No associated sxs. No relief with [facility] tx at [facility]. VSS. Lungs CTAB, left chest wall TTP. White plaque lesions noted on soft palate. CXR negative. EKG showed sinus tachycardia. I suspect that sxs are likely due to musculoskeletal origin and I do not feel that further workup or imaging is warranted at this time. Plan to d/c pt home. Advised pt to follow up with PCP and to take ibuprofen for pain relief. Pt given nystatin for  oral thrush.   Evaluation does not show pathology requring ongoing emergent intervention or admission. Pt is hemodynamically stable and mentating appropriately. Discussed findings/results and plan with patient/guardian, who agrees with plan. All questions answered. Return precautions discussed and outpatient follow up given.      Chesley Noon Rogers, [place] 11/25/14 [place], MD 11/28/14 1110

## 2014-11-24 NOTE — ED Notes (Signed)
Pt reports SOB and chest pain starting after going to [employer] last night- Hx of astha and anxiety. Pt seen [facility] and given neb tx. Pt also reports she was at a trampoline park 3 days ago and ?pulled muscle

## 2014-11-24 NOTE — Discharge Instructions (Signed)
Please take your prescription as prescribed. You may also take Ibuprofen for your chest pain. Please follow up with your primary care provider in 3-4 days. Please return to the Emergency Department if symptoms worsen.

## 2014-12-05 DIAGNOSIS — Z0271 Encounter for disability determination: Secondary | ICD-10-CM

## 2014-12-16 ENCOUNTER — Ambulatory Visit: Payer: BLUE CROSS/BLUE SHIELD | Admitting: Pediatrics

## 2014-12-30 ENCOUNTER — Encounter: Payer: Self-pay | Admitting: Pediatrics

## 2014-12-30 ENCOUNTER — Ambulatory Visit (INDEPENDENT_AMBULATORY_CARE_PROVIDER_SITE_OTHER): Payer: BLUE CROSS/BLUE SHIELD | Admitting: Pediatrics

## 2014-12-30 ENCOUNTER — Encounter (INDEPENDENT_AMBULATORY_CARE_PROVIDER_SITE_OTHER): Payer: Self-pay

## 2014-12-30 VITALS — BP 114/76 | HR 76 | Resp 16

## 2014-12-30 DIAGNOSIS — J339 Nasal polyp, unspecified: Secondary | ICD-10-CM

## 2014-12-30 DIAGNOSIS — J310 Chronic rhinitis: Secondary | ICD-10-CM | POA: Diagnosis not present

## 2014-12-30 DIAGNOSIS — T485X5A Adverse effect of other anti-common-cold drugs, initial encounter: Secondary | ICD-10-CM

## 2014-12-30 DIAGNOSIS — T485X1A Poisoning by other anti-common-cold drugs, accidental (unintentional), initial encounter: Secondary | ICD-10-CM | POA: Diagnosis not present

## 2014-12-30 DIAGNOSIS — J301 Allergic rhinitis due to pollen: Secondary | ICD-10-CM

## 2014-12-30 DIAGNOSIS — J4530 Mild persistent asthma, uncomplicated: Secondary | ICD-10-CM | POA: Insufficient documentation

## 2014-12-30 NOTE — Patient Instructions (Addendum)
Add prednisone 10 mg twice a day for 4 days ,10 mg on Day 5 Stop Afrin nasal spray  Dymista 1 spray per nostril twice a day Qvar 40 one puff twice a day to prevent cough or wheeze Ventolin 2 puffs every 4 hours if needed for wheezing or coughing spells Call me if her asthma is not well controlled or if she cannot get off the Afrin nasal spray

## 2014-12-30 NOTE — Progress Notes (Signed)
  [address] [place] [address] Dept: [phone]  FOLLOW UP NOTE  Patient ID: Carmen Daniels, female    DOB: 02-08-1986  Age: 29 y.o. MRN: [id number] Date of Office Visit: 12/30/2014  Assessment Chief Complaint: Follow-up and Nasal Congestion  HPI [name] presents for follow-up of her nasal congestion. She has been using Afrin nasal spray for a few weeks. Her asthma is well controlled. She is not having headaches She has a history of nasal polyps which required surgery in 2002  She is on Qvar 40-1 puff twice a day, Dymista 1 spray per nostril twice a day, Ventolin 2 puffs every 4 hours if needed. Her other medications are outlined in the chart   Drug Allergies:  Allergies  Allergen Reactions  . Keflex [Cephalexin] Anaphylaxis  . Flagyl [Metronidazole]     Physical Exam: BP 114/76 mmHg  Pulse 76  Resp 16   Physical Exam  Constitutional: She is oriented to person, place, and time. She appears well-developed and well-nourished.  HENT:  Eyes normal. Ears normal. Nose normal. Pharynx normal.  Neck: Neck supple.  Cardiovascular:  S1 and S2 normal no murmurs  Pulmonary/Chest:  Clear to percussion and auscultation  Lymphadenopathy:    She has no cervical adenopathy.  Neurological: She is alert and oriented to person, place, and time.  Vitals reviewed.   Diagnostics:  FVC 4.99 L FEV1 3.60 L. Predicted FVC 4.26 L predicted FEV1 3.55 L-shows a mild reduction in the FEV1 percent  Assessment and Plan: 1. Mild persistent asthma, uncomplicated   2. Allergic rhinitis due to pollen   3. Rhinitis medicamentosa   4. Nasal polyposis     No orders of the defined types were placed in this encounter.    Patient Instructions  Add prednisone 10 mg twice a day for 4 days ,10 mg on Day 5 Stop Afrin nasal spray  Dymista 1 spray per nostril twice a day Qvar 40 one puff twice a day to prevent cough or wheeze Ventolin 2 puffs every 4 hours if needed for wheezing or  coughing spells Call me if her asthma is not well controlled or if she cannot get off the Afrin nasal spray    Return in about 6 months (around 06/29/2015).    Thank you for the opportunity to care for this patient.  Please do not hesitate to contact me with questions.  Penne Lash, M.D.  [facility] [address] [place], [place] [address] [phone]

## 2014-12-31 ENCOUNTER — Encounter: Payer: Self-pay | Admitting: Pediatrics

## 2015-01-09 ENCOUNTER — Other Ambulatory Visit: Payer: Self-pay | Admitting: Pediatrics

## 2015-03-06 ENCOUNTER — Telehealth: Payer: Self-pay | Admitting: Neurology

## 2015-03-06 NOTE — Telephone Encounter (Signed)
Spoke with the patient. She is going to stop by my office on the way out after her apt on 02/02 to give me her new insurance information and schedule botox apt.

## 2015-03-06 NOTE — Telephone Encounter (Signed)
I called the patient. She has had a migraine everyday for the past couple of weeks. She states the Imitrex helps but she has ran out of it. She would like to know if there is anything else she can have for the migraine. She would like a refill for Imitrex if Dr. Krista Blue is going to keep her on it. Dr. Krista Blue tried to initiate Botox for the patient. Danielle tried to call her several times in August, but the patient never returned her call to schedule. The patient stated that she was waiting for her Medicaid. She now has Medicaid and would like to speak to [facility] about Botox. I advised that I would let Andee Poles know and after speaking to Dr. Krista Blue about immediate treatment I would call her back.

## 2015-03-06 NOTE — Telephone Encounter (Signed)
Pt's mother called to schedule appt for pt . She is having a migraine everyday for the last 2 day. Please call and [phone]

## 2015-03-07 NOTE — Telephone Encounter (Signed)
Reviewed, agree with BOTOX as preventive medication

## 2015-03-27 ENCOUNTER — Ambulatory Visit (INDEPENDENT_AMBULATORY_CARE_PROVIDER_SITE_OTHER): Payer: BLUE CROSS/BLUE SHIELD | Admitting: Neurology

## 2015-03-27 ENCOUNTER — Encounter: Payer: Self-pay | Admitting: Neurology

## 2015-03-27 VITALS — BP 100/69 | HR 114 | Ht 71.0 in | Wt 166.0 lb

## 2015-03-27 DIAGNOSIS — G43709 Chronic migraine without aura, not intractable, without status migrainosus: Secondary | ICD-10-CM | POA: Insufficient documentation

## 2015-03-27 DIAGNOSIS — G43119 Migraine with aura, intractable, without status migrainosus: Secondary | ICD-10-CM

## 2015-03-27 MED ORDER — SUMATRIPTAN SUCCINATE 100 MG PO TABS: 100.0000 mg | ORAL_TABLET | Freq: Once | ORAL | Status: DC

## 2015-03-27 NOTE — Progress Notes (Signed)
Chief Complaint  Patient presents with  . Migraine    She is here with her mother, Manuela Schwartz to discuss her increase in migraines.  She is taking Topamax 200mg  at bedtime.  Sumatriptan works well as a rescue medication. She would like to proceed with Botox treatment.      PATIENT: Carmen Daniels DOB: 06-03-1985  Chief Complaint  Patient presents with  . Migraine    She is here with her mother, Manuela Schwartz to discuss her increase in migraines.  She is taking Topamax 200mg  at bedtime.  Sumatriptan works well as a rescue medication. She would like to proceed with Botox treatment.     HISTORICAL  Carmen Daniels is a 30 years old right-handed female, accompanied by her mother,, seen in refer by her pain management doctor Dr. Nelva Bush for evaluation of migraines  I have reviewed most recent office note in April 2016, she has past medical history of bipolar disorder, complains of frequent headaches, diffuse body achy pain UDS was negative,   She complains of migraine headaches since 2011, her typical migraine started with tunnel vision, lasting for 10-15 minutes, followed by severe pounding headache with associated light noise sensitivity, sumatriptan 100 mg as needed has been very helpful, but sometimes she require repeat dosage. She is now having 4-5 typical prolonged migraines each week, lasting for 1 day,  Trigger for her migraines are stress, sleep deprivation,  She has tried over-the-counter aspirin, Tylenol, NSAIDs Excedrin Migraine, without helping her headaches, she has seen different neurologists in the past, has tried different preventive medications, including beta blocker, calcium channel blocker without helping, currently she is taking Topamax 25 mg 7 to 8 tablets every night, which is adjusted by her psychologist Dr. 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 No white matter disease or large vessel/lacunar infarction.  UPDATE Mar 27 2015: She is now taking Topamax 25 mg tablets 8 tablets every night, still has frequent migraine headaches, also consider Botox injection as migraine prevention, she has variable frequency of migraine, few weeks ago, she had 3 weeks of daily headaches, Imitrex as needed is helpful in 60 minutes, she still has dull achy pain, but no longer has the intense headaches.  used up all 9 tablets of allowed Imitrex each month. She usually has visual aura proceeding each migraine headaches,  She is on polypharmacy for her poor disorder, currently taking Topamax, latuda, seroquel, Xanax, Abilify  REVIEW OF SYSTEMS: Full 14 system review of systems performed and notable only for unexpected weight change, hearing loss, ear pain, ringing ears, runny nose, cough, wheezing, shortness of breath, chest tightness, heat intolerance, diarrhea, nausea, vomiting, insomnia, snoring, frequent infection, joint pain, back pain, achy muscles, neck pain, neck stiffness, bruise easily, dizziness, headaches, numbness, agitations  ALLERGIES: Allergies  Allergen Reactions  . Keflex [Cephalexin] Anaphylaxis  . Flagyl [Metronidazole]     HOME MEDICATIONS: Current Outpatient Prescriptions  Medication Sig Dispense Refill  . ALPRAZolam (XANAX) 1 MG tablet Take 1 mg by mouth 4 (four) times daily.  2  . ARIPiprazole (ABILIFY) 30 MG tablet Take 30 mg by mouth daily.  3  . Eszopiclone 3 MG TABS Take 3 mg by mouth at bedtime.  3  . HYDROcodone-acetaminophen (NORCO) 10-325 MG per tablet Take 1 tablet by mouth 2 (two)  times daily as needed. for pain  0  . LATUDA 40 MG TABS tablet TAKE 1 TABLET EVERY EVENING WITH FOOD  1  . promethazine (PHENERGAN) 25 MG tablet Take 25 mg by mouth 3 (three) times daily as needed.  0  . QUEtiapine (SEROQUEL) 25 MG tablet Take  25 mg by mouth at bedtime.  2  . SUMAtriptan (IMITREX) 100 MG tablet TAKE 1 TABLET BY MOUTH. MAY TAKE REPEAT WITH 1 TABLET IN TWO HOURS IF NEEDED  5  . topiramate (TOPAMAX) 25 MG tablet TAKE 7-8 TABLETS AT BEDTIME  0     PAST MEDICAL HISTORY: Past Medical History  Diagnosis Date  . Bipolar disorder ([place])   . Anxiety   . Nausea   . Neck pain   . Migraines   . Chronic pain   . Back pain   . OCD (obsessive compulsive disorder)   . Asthma   . Nasal polyps   . Allergic rhinitis     PAST SURGICAL HISTORY: Past Surgical History  Procedure Laterality Date  . Nasal sinus surgery    . Wisdom tooth extraction      FAMILY HISTORY: Family History  Problem Relation Age of Onset  . Arthritis Maternal Grandmother   . Arthritis Maternal Grandfather   . Depression Paternal Grandmother   . Healthy Father   . Healthy Mother     SOCIAL HISTORY:  Social History   Social History  . Marital Status: Married    Spouse Name: N/A  . Number of Children: 0  . Years of Education: 14   Occupational History  . Unemployed    Social History Main Topics  . Smoking status: Current Every Day Smoker -- 0.50 packs/day    Types: Cigarettes  . Smokeless tobacco: Not on file  . Alcohol Use: 0.0 oz/week    0 Standard drinks or equivalent per week     Comment: Occasional alcohol use  . Drug Use: No  . Sexual Activity: Yes    Birth Control/ Protection: Condom   Other Topics Concern  . Not on file   Social History Narrative   Lives at home alone.   Right-handed.   3 glasses of green tea per day.       PHYSICAL EXAM   Filed Vitals:   03/27/15 0904  BP: 100/69  Pulse: 114  Height: 5\' 11"  (1.803 m)  Weight: 166 lb (75.297 kg)    Not recorded      Body mass index is 23.16 kg/(m^2).  PHYSICAL EXAMNIATION:  Gen: NAD, conversant, well nourised, obese, well groomed                     Cardiovascular: Regular rate rhythm, no peripheral edema, warm, nontender. Eyes: Conjunctivae  clear without exudates or hemorrhage Neck: Supple, no carotid bruise. Pulmonary: Clear to auscultation bilaterally   NEUROLOGICAL EXAM:  MENTAL STATUS: Speech:    Speech is normal; fluent and spontaneous with normal comprehension.  Cognition:     Orientation to time, place and person     Normal recent and remote memory     Normal Attention span and concentration     Normal Language, naming, repeating,spontaneous speech     Fund of knowledge   CRANIAL NERVES: CN II: Visual fields are full to confrontation. Fundoscopic exam is normal with sharp discs and no vascular changes. Pupils are round equal and briskly reactive to light. CN III, IV, VI: extraocular movement are normal. No ptosis. CN V:  Facial sensation is intact to pinprick in all 3 divisions bilaterally. Corneal responses are intact.  CN VII: Face is symmetric with normal eye closure and smile. CN VIII: Hearing is normal to rubbing fingers CN IX, X: Palate elevates symmetrically. Phonation is normal. CN XI: Head turning and shoulder shrug are intact CN XII: Tongue is midline with normal movements and no atrophy.  MOTOR: There is no pronator drift of out-stretched arms. Muscle bulk and tone are normal. Muscle strength is normal.  REFLEXES: Reflexes are 2+ and symmetric at the biceps, triceps, knees, and ankles. Plantar responses are flexor.  SENSORY: Intact to light touch, pinprick, position sense, and vibration sense are intact in fingers and toes.  COORDINATION: Rapid alternating movements and fine finger movements are intact. There is no dysmetria on finger-to-nose and heel-knee-shin.    GAIT/STANCE: Posture is normal. Gait is steady with normal steps, base, arm swing, and turning. Heel and toe walking are normal. Tandem gait is normal.  Romberg is absent.  DIAGNOSTIC DATA (LABS, IMAGING, TESTING) - I reviewed patient records, labs, notes, testing and imaging myself where available.   ASSESSMENT AND  PLAN  Carmen Daniels is a 30 y.o. female    Chronic migraine headaches with aura  She has tried and failed multiple different preventive medications in the past, currently is taking Topamax, previously has failed beta blocker, calcium channel blocker  She is on polypharmacy treatment latuda, and Abilify, Xanax, seroquel, escopiclone  Preauthorization for Botox as migraine prevention  Sumatriptan as needed Bipolar disorder Neck pain  Most consistent with musculoskeletal etiology, I have advised her neck stretching exercise when necessary and says, heating pad, Botox injection likely will help too.    Marcial Pacas, M.D. Ph.D.  [facility] [address] [place],  [address] Ph: [phone] Fax: [phone]  CC: To Dr. [name], Dr. Nelva Bush, Delfino Lovett

## 2015-04-01 ENCOUNTER — Telehealth: Payer: Self-pay | Admitting: Neurology

## 2015-04-03 NOTE — Telephone Encounter (Signed)
Error

## 2015-04-29 ENCOUNTER — Telehealth: Payer: Self-pay | Admitting: Neurology

## 2015-04-29 NOTE — Telephone Encounter (Signed)
Rx sent in 03/27/15 with 11 additional refills - spoke to patient - she will contact her pharmacy.

## 2015-04-29 NOTE — Telephone Encounter (Signed)
Patient called to request refill of SUMAtriptan (IMITREX) 100 MG tablet, [facility] states patient is out of refills and needs to be renewed.

## 2015-05-05 ENCOUNTER — Ambulatory Visit (INDEPENDENT_AMBULATORY_CARE_PROVIDER_SITE_OTHER): Payer: BLUE CROSS/BLUE SHIELD | Admitting: Neurology

## 2015-05-05 ENCOUNTER — Encounter: Payer: Self-pay | Admitting: Neurology

## 2015-05-05 ENCOUNTER — Other Ambulatory Visit: Payer: Self-pay | Admitting: *Deleted

## 2015-05-05 VITALS — BP 137/90 | HR 102 | Ht 71.0 in | Wt 166.0 lb

## 2015-05-05 DIAGNOSIS — G43719 Chronic migraine without aura, intractable, without status migrainosus: Secondary | ICD-10-CM | POA: Diagnosis not present

## 2015-05-05 MED ORDER — SUMATRIPTAN SUCCINATE 6 MG/0.5ML ~~LOC~~ SOLN: 6.0000 mg | SUBCUTANEOUS | Status: DC | PRN

## 2015-05-05 NOTE — Progress Notes (Signed)
Chief Complaint  Patient presents with  . Migraine    Botox 200 units - specialty pharmacy      PATIENT: Carmen Daniels DOB: 09-26-1985  Chief Complaint  Patient presents with  . Migraine    Botox 200 units - specialty pharmacy     HISTORICAL  Carmen Daniels is a 30 years old right-handed female, accompanied by her mother,, seen in refer by her pain management doctor Dr. Nelva Bush for evaluation of migraines  I have reviewed most recent office note in April 2016, she has past medical history of bipolar disorder, complains of frequent headaches, diffuse body achy pain UDS was negative,   She complains of migraine headaches since 2011, her typical migraine started with tunnel vision, lasting for 10-15 minutes, followed by severe pounding headache with associated light noise sensitivity, sumatriptan 100 mg as needed has been very helpful, but sometimes she require repeat dosage. She is now having 4-5 typical prolonged migraines each week, lasting for 1 day,  Trigger for her migraines are stress, sleep deprivation,  She has tried over-the-counter aspirin, Tylenol, NSAIDs Excedrin Migraine, without helping her headaches, she has seen different neurologists in the past, has tried different preventive medications, including beta blocker, calcium channel blocker without helping, currently she is taking Topamax 25 mg 7 to 8 tablets every night, which is adjusted by her psychologist Dr. 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 No white matter disease or large vessel/lacunar infarction.  UPDATE Mar 27 2015: She is now taking Topamax 25 mg tablets 8 tablets every night, still has frequent migraine headaches, also consider Botox injection as migraine prevention,  she has variable frequency of migraine, few weeks ago, she had 3 weeks of daily headaches, Imitrex as needed is helpful in 60 minutes, she still has dull achy pain, but no longer has the intense headaches.  used up all 9 tablets of allowed Imitrex each month. She usually has visual aura proceeding each migraine headaches,  She is on polypharmacy for her mood disorder, currently taking Topamax, latuda, seroquel, Xanax, Abilify  UPDATE May 05 2015:   REVIEW OF SYSTEMS: Full 14 system review of systems performed and notable only for unexpected weight change, hearing loss, ear pain, ringing ears, runny nose, cough, wheezing, shortness of breath, chest tightness, heat intolerance, diarrhea, nausea, vomiting, insomnia, snoring, frequent infection, joint pain, back pain, achy muscles, neck pain, neck stiffness, bruise easily, dizziness, headaches, numbness, agitations  ALLERGIES: Allergies  Allergen Reactions  . Keflex [Cephalexin] Anaphylaxis  . Flagyl [Metronidazole]     HOME MEDICATIONS: Current Outpatient Prescriptions  Medication Sig Dispense Refill  . ALPRAZolam (XANAX) 1 MG tablet Take 1 mg by mouth 4 (four) times daily.  2  . ARIPiprazole (ABILIFY) 30 MG tablet Take 30 mg by mouth daily.  3  . Eszopiclone 3 MG TABS Take 3 mg by mouth at bedtime.  3  . HYDROcodone-acetaminophen (NORCO) 10-325 MG per tablet Take 1 tablet by mouth 2 (two) times daily as needed. for pain  0  . LATUDA 40 MG TABS tablet TAKE 1 TABLET EVERY EVENING WITH FOOD  1  . promethazine (PHENERGAN) 25 MG tablet Take 25 mg by mouth 3 (three) times daily as needed.  0  . QUEtiapine (SEROQUEL) 25 MG tablet Take 25 mg by mouth  at bedtime.  2  . SUMAtriptan (IMITREX) 100 MG tablet TAKE 1 TABLET BY MOUTH. MAY TAKE REPEAT WITH 1 TABLET IN TWO HOURS IF NEEDED  5  . topiramate (TOPAMAX) 25 MG tablet TAKE 7-8 TABLETS AT BEDTIME  0     PAST MEDICAL HISTORY: Past Medical History  Diagnosis Date  . Bipolar disorder ([place])   .  Anxiety   . Nausea   . Neck pain   . Migraines   . Chronic pain   . Back pain   . OCD (obsessive compulsive disorder)   . Asthma   . Nasal polyps   . Allergic rhinitis     PAST SURGICAL HISTORY: Past Surgical History  Procedure Laterality Date  . Nasal sinus surgery    . Wisdom tooth extraction      FAMILY HISTORY: Family History  Problem Relation Age of Onset  . Arthritis Maternal Grandmother   . Arthritis Maternal Grandfather   . Depression Paternal Grandmother   . Healthy Father   . Healthy Mother     SOCIAL HISTORY:  Social History   Social History  . Marital Status: Married    Spouse Name: N/A  . Number of Children: 0  . Years of Education: 14   Occupational History  . Unemployed    Social History Main Topics  . Smoking status: Current Every Day Smoker -- 0.50 packs/day    Types: Cigarettes  . Smokeless tobacco: Not on file  . Alcohol Use: 0.0 oz/week    0 Standard drinks or equivalent per week     Comment: Occasional alcohol use  . Drug Use: No  . Sexual Activity: Yes    Birth Control/ Protection: Condom   Other Topics Concern  . Not on file   Social History Narrative   Lives at home alone.   Right-handed.   3 glasses of green tea per day.       PHYSICAL EXAM   Filed Vitals:   05/05/15 1205  BP: 137/90  Pulse: 102  Height: 5\' 11"  (1.803 m)  Weight: 166 lb (75.297 kg)    Not recorded      Body mass index is 23.16 kg/(m^2).  PHYSICAL EXAMNIATION:  Gen: NAD, conversant, well nourised, obese, well groomed                     Cardiovascular: Regular rate rhythm, no peripheral edema, warm, nontender. Eyes: Conjunctivae clear without exudates or hemorrhage Neck: Supple, no carotid bruise. Pulmonary: Clear to auscultation bilaterally   NEUROLOGICAL EXAM:  MENTAL STATUS: Speech:    Speech is normal; fluent and spontaneous with normal comprehension.  Cognition:     Orientation to time, place and person     Normal recent and  remote memory     Normal Attention span and concentration     Normal Language, naming, repeating,spontaneous speech     Fund of knowledge   CRANIAL NERVES: CN II: Visual fields are full to confrontation. Fundoscopic exam is normal with sharp discs and no vascular changes. Pupils are round equal and briskly reactive to light. CN III, IV, VI: extraocular movement are normal. No ptosis. CN V: Facial sensation is intact to pinprick in all 3 divisions bilaterally. Corneal responses are intact.  CN VII: Face is symmetric with normal eye closure and smile. CN VIII: Hearing is normal to rubbing fingers CN IX, X: Palate elevates symmetrically. Phonation is normal. CN XI: Head turning and shoulder shrug are intact CN XII: Tongue  is midline with normal movements and no atrophy.  MOTOR: There is no pronator drift of out-stretched arms. Muscle bulk and tone are normal. Muscle strength is normal.  REFLEXES: Reflexes are 2+ and symmetric at the biceps, triceps, knees, and ankles. Plantar responses are flexor.  SENSORY: Intact to light touch, pinprick, position sense, and vibration sense are intact in fingers and toes.  COORDINATION: Rapid alternating movements and fine finger movements are intact. There is no dysmetria on finger-to-nose and heel-knee-shin.    GAIT/STANCE: Posture is normal. Gait is steady with normal steps, base, arm swing, and turning. Heel and toe walking are normal. Tandem gait is normal.  Romberg is absent.  DIAGNOSTIC DATA (LABS, IMAGING, TESTING) - I reviewed patient records, labs, notes, testing and imaging myself where available.   ASSESSMENT AND PLAN  ANAISABEL CALLIES is a 30 y.o. female    Chronic migraine headaches with aura  She has tried and failed multiple different preventive medications in the past, currently is taking Topamax, previously has failed beta blocker, calcium channel blocker  She is on polypharmacy treatment latuda, and Abilify, Xanax, seroquel,  escopiclone  Imitrex tablet 100 mg as needed was helpful but often she has to take multiple dose  I wrote Imitrex injection today  This is her first Botox injection as migraine prevention, potential side effect and procedures procedure was explained, consent form was signed.  BOTOX injection was performed according to protocol by [facility].     Corrugator 2 sites, 10 units Procerus 1 site, 5 unit Frontalis 4 sites,  20 units, Temporalis 8 sites,  40 units  Occipitalis 6 sites, 30 units Cervical Paraspinal, 4 sites, 20 units Trapezius, 6 sites, 30 units Extra 45 units was injected along bilateral cervical paraspinal muscles  Patient tolerate the injection well. Will return for repeat injection in 3 months.  Marcial Pacas, M.D. Ph.D.  [facility] [address], [place], [place] [address] Ph: [phone] Fax: [phone]  CC: To Dr. [name], Dr. Nelva Bush, Delfino Lovett

## 2015-05-05 NOTE — Progress Notes (Signed)
**  Botox 100 units x 2 vials, Lot [id number], Exp 10/2017, [place] [id number], specialty pharmacy**mck,rn.

## 2015-05-08 ENCOUNTER — Other Ambulatory Visit: Payer: Self-pay | Admitting: *Deleted

## 2015-05-08 ENCOUNTER — Telehealth: Payer: Self-pay | Admitting: Neurology

## 2015-05-08 MED ORDER — SUMATRIPTAN SUCCINATE 6 MG/0.5ML ~~LOC~~ SOAJ: SUBCUTANEOUS | Status: DC

## 2015-05-08 NOTE — Telephone Encounter (Signed)
Rx at pharmacy changed to pre-filled auto injections at the pharmacy.  Patient aware.

## 2015-05-08 NOTE — Telephone Encounter (Signed)
Pt is requesting the rx for SUMAtriptan 6 MG/0.5ML SOAJ but needs the injection in prefilled syringes. Pt seemed a little confused about taking the medication and how much is to be in each cartridge. While on the phone she received [facility] from [facility] telling her the medication was ready for pick up. I asked her to call them to make sure it is for the prefilled syringes.

## 2015-05-12 ENCOUNTER — Encounter: Payer: Self-pay | Admitting: *Deleted

## 2015-05-12 ENCOUNTER — Telehealth: Payer: Self-pay | Admitting: Neurology

## 2015-05-12 NOTE — Telephone Encounter (Signed)
Pt called sts she had botox inj 05/05/15. Said she's had migraine everyday since having botox injection. She said Friday (3/17) and Saturday (3/18) night she took SUMAtriptan 6 MG/0.5ML SOAJ . The HA was dull after that.  On Sunday she had bad HA and took SUMAtriptan (IMITREX) 100 MG tablet she took 1 tab about noon but HA didn't get better. She also took 2 aspirin and laid down. Two hours later she took another sumatriptan tablet. She got a nosebleed from 1 nostril but doesn't remember how long after taking the medication. This morning she was making her bed and she had a severe sharp pain in her head, she laid on the floor for about 1-2 minutes, then she got up and her nose was bleeding from both nostrils. The HA is still severe. She has taken SUMAtriptan (IMITREX) 100 MG tablet 1 tab about 5:30am and promethazine for nausea. The HA is dull now but it has not stopped.

## 2015-05-12 NOTE — Telephone Encounter (Signed)
Per Dr. Krista Blue, offer her an infusion of VPA 1 gram.  Spoke to patient - her allergies have been confirmed in her chart and she states she is not pregnant.  She would like to discuss this medication with her mother and will call back if she would like to come in for treatment.

## 2015-05-13 NOTE — Telephone Encounter (Signed)
Spoke to her mother - she will need the appt for Wednesday (pt does not have car or a ride today) - will be here at 2pm on 05/14/14.  Tina ([facility]) aware.

## 2015-05-13 NOTE — Telephone Encounter (Signed)
Mom called to advise daughter would like to schedule VPA infusion. Please call [phone].

## 2015-05-13 NOTE — Telephone Encounter (Signed)
Called to check on her - left message - if she decides she would like the IV, the offer is there for her and is she is feeling better, she does not have to call me back.

## 2015-05-14 ENCOUNTER — Telehealth: Payer: Self-pay | Admitting: Neurology

## 2015-05-14 NOTE — Telephone Encounter (Signed)
Called patient to schedule next injection, left a VM asking her to return my call.

## 2015-05-15 ENCOUNTER — Encounter: Payer: Self-pay | Admitting: *Deleted

## 2015-05-15 ENCOUNTER — Ambulatory Visit (INDEPENDENT_AMBULATORY_CARE_PROVIDER_SITE_OTHER): Payer: BLUE CROSS/BLUE SHIELD | Admitting: *Deleted

## 2015-05-15 ENCOUNTER — Other Ambulatory Visit: Payer: Self-pay | Admitting: *Deleted

## 2015-05-15 ENCOUNTER — Telehealth (INDEPENDENT_AMBULATORY_CARE_PROVIDER_SITE_OTHER): Payer: BLUE CROSS/BLUE SHIELD | Admitting: Neurology

## 2015-05-15 DIAGNOSIS — G43719 Chronic migraine without aura, intractable, without status migrainosus: Secondary | ICD-10-CM | POA: Diagnosis not present

## 2015-05-15 DIAGNOSIS — G43809 Other migraine, not intractable, without status migrainosus: Secondary | ICD-10-CM | POA: Diagnosis not present

## 2015-05-15 MED ORDER — KETOROLAC TROMETHAMINE 60 MG/2ML IM SOLN
60.0000 mg | Freq: Once | INTRAMUSCULAR | Status: AC
  Administered 2015-05-15: 60 mg via INTRAMUSCULAR

## 2015-05-15 MED ORDER — PROMETHAZINE HCL 25 MG RE SUPP: 25.0000 mg | Freq: Every day | RECTAL | Status: DC

## 2015-05-15 NOTE — Telephone Encounter (Signed)
Patient has had a persistent headache since 05/12/15 that is not responding well to sumatriptan.  She came to our office to for a VPA 1gram infusion on 05/14/15 that resolved her migraine completely that day.  However, she woke up with another severe migraine this morning.  Again, it is not responding to sumatriptan.  She has promethazine tablets at home but is unable to keep them down (vomiting).  She has had some relief in the past with Toradol injections. Her confirmed allergies are Keflex and Flagyl.

## 2015-05-15 NOTE — Telephone Encounter (Signed)
Ok per vo by Dr. Erlinda Hong, give Toradol 60mg  IM.  Dr. Felecia Shelling has approved promethazine 25mg  suppositories, 1qd prn for nausea, #12 x 1.  If migraine does not resolve, pt's mother plans to take her the ED for a stronger IV infusion.

## 2015-05-15 NOTE — Telephone Encounter (Signed)
Pt called in crying with a migraine. She took her SUMAtriptan (IMITREX) 100 MG tablet about 30 min ago. She says before she called she threw up. She states her migraine is starting to work but is concerned about the infusion not working. She would like a call from the nurse to discuss. While talking with her was very up set crying. Please call and advise [phone]

## 2015-05-15 NOTE — Telephone Encounter (Signed)
Returned call to patient left message for a return call °

## 2015-06-17 ENCOUNTER — Telehealth: Payer: Self-pay | Admitting: Neurology

## 2015-06-17 ENCOUNTER — Encounter: Payer: Self-pay | Admitting: *Deleted

## 2015-06-17 NOTE — Telephone Encounter (Signed)
She is requesting a letter stating she needs to be out completely for the four month period of time noted below, due to migraines.  This is to excuse her from her required community service.  She is aware that Dr. Krista Blue is unable to provide this letter.  A letter stating her migraine frequency and treatment plan has been faxed to [employer], per her request.

## 2015-06-17 NOTE — Telephone Encounter (Signed)
Patient called to request that a note be faxed to [employer] [phone] regarding unable to work 11/15-3/15.

## 2015-08-04 ENCOUNTER — Telehealth: Payer: Self-pay | Admitting: Neurology

## 2015-08-04 NOTE — Telephone Encounter (Addendum)
Mother called to ask when next BOTOX appointment would be due, states no one has called to schedule. Is aware Dr. Krista Blue is out of the office until tomorrow. Mother also request copy of letter that was faxed to [employer] regarding community service that she was unable to do, due to migraine situation.

## 2015-08-04 NOTE — Telephone Encounter (Signed)
She would like a copy of the letter written for [employer] , on 06/17/15,mailed to her home address.  Andee Poles will check on her Botox appt.

## 2015-08-05 NOTE — Telephone Encounter (Signed)
Patient returned Carmen Daniels's call, would like to schedule BOTOX appointment. Also is checking status of letter to [employer]. Needs ASAP.

## 2015-08-05 NOTE — Telephone Encounter (Signed)
Called patient to schedule botox she did not answer so I left a VM asking her to return my call.

## 2015-08-11 NOTE — Telephone Encounter (Signed)
Called to schedule botox injection but did not receive an answer. Left the patient a VM.

## 2015-09-04 ENCOUNTER — Encounter: Payer: Self-pay | Admitting: Neurology

## 2015-09-04 ENCOUNTER — Ambulatory Visit (INDEPENDENT_AMBULATORY_CARE_PROVIDER_SITE_OTHER): Payer: BLUE CROSS/BLUE SHIELD | Admitting: Neurology

## 2015-09-04 VITALS — BP 123/75 | HR 75 | Ht 71.0 in | Wt 165.2 lb

## 2015-09-04 DIAGNOSIS — G43719 Chronic migraine without aura, intractable, without status migrainosus: Secondary | ICD-10-CM | POA: Diagnosis not present

## 2015-09-04 MED ORDER — ONDANSETRON 4 MG PO TBDP: 4.0000 mg | ORAL_TABLET | Freq: Three times a day (TID) | ORAL | Status: DC | PRN

## 2015-09-04 NOTE — Progress Notes (Signed)
**  Botox 100 units x 2 vials, Lot [id number], Exp 02/2018, [place] [id number], specialty pharmacy.**mck,rn.

## 2015-09-04 NOTE — Progress Notes (Signed)
Chief Complaint  Patient presents with  . Migraine    Botox 200 units - specialty pharmacy.      PATIENT: Carmen Daniels DOB: 07/25/1985  Chief Complaint  Patient presents with  . Migraine    Botox 200 units - specialty pharmacy.     HISTORICAL  Carmen Daniels is a 30 years old right-handed female, accompanied by her mother,, seen in refer by her pain management doctor Dr. Nelva Bush for evaluation of migraines  I have reviewed most recent office note in April 2016, she has past medical history of bipolar disorder, complains of frequent headaches, diffuse body achy pain UDS was negative,   She complains of migraine headaches since 2011, her typical migraine started with tunnel vision, lasting for 10-15 minutes, followed by severe pounding headache with associated light noise sensitivity, sumatriptan 100 mg as needed has been very helpful, but sometimes she require repeat dosage. She is now having 4-5 typical prolonged migraines each week, lasting for 1 day,  Trigger for her migraines are stress, sleep deprivation,  She has tried over-the-counter aspirin, Tylenol, NSAIDs Excedrin Migraine, without helping her headaches, she has seen different neurologists in the past, has tried different preventive medications, including beta blocker, calcium channel blocker without helping, currently she is taking Topamax 25 mg 7 to 8 tablets every night, which is adjusted by her psychologist Dr. 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 No white matter disease or large vessel/lacunar infarction.  UPDATE Mar 27 2015: She is now taking Topamax 25 mg tablets 8 tablets every night, still has frequent migraine headaches, also consider Botox injection as migraine prevention,  she has variable frequency of migraine, few weeks ago, she had 3 weeks of daily headaches, Imitrex as needed is helpful in 60 minutes, she still has dull achy pain, but no longer has the intense headaches.  used up all 9 tablets of allowed Imitrex each month. She usually has visual aura proceeding each migraine headaches,  She is on polypharmacy for her mood disorder, currently taking Topamax, latuda, seroquel, Xanax, Abilify  UPDATE September 04 2015: She had her first Botox injection as migraine prevention in May 05 2015, she had frequent headache postinjection for 2 weeks, eventually she noticed a benefit, she has much less, less severe migraine, but even with the injection she has 2-3 migraines each week, noticed wearing off at the end of 3 months.   REVIEW OF SYSTEMS: Full 14 system review of systems performed and notable only for unexpected weight change, hearing loss, ear pain, ringing ears, runny nose, cough, wheezing, shortness of breath, chest tightness, heat intolerance, diarrhea, nausea, vomiting, insomnia, snoring, frequent infection, joint pain, back pain, achy muscles, neck pain, neck stiffness, bruise easily, dizziness, headaches, numbness, agitations  ALLERGIES: Allergies  Allergen Reactions  . Keflex [Cephalexin] Anaphylaxis  . Flagyl [Metronidazole]     HOME MEDICATIONS: Current Outpatient Prescriptions  Medication Sig Dispense Refill  . ALPRAZolam (XANAX) 1 MG tablet Take 1 mg by mouth 4 (four) times daily.  2  . ARIPiprazole (ABILIFY) 30 MG tablet Take 30 mg by mouth daily.  3  . Eszopiclone 3 MG TABS Take 3 mg by mouth at bedtime.  3  . HYDROcodone-acetaminophen (NORCO) 10-325 MG per tablet Take 1 tablet by mouth 2 (two) times daily as  needed. for pain  0  . LATUDA 40 MG TABS tablet TAKE 1 TABLET EVERY EVENING WITH FOOD  1  . promethazine (PHENERGAN) 25 MG tablet Take 25 mg by mouth 3 (three) times daily as needed.  0  . QUEtiapine (SEROQUEL) 25 MG tablet Take 25 mg by  mouth at bedtime.  2  . SUMAtriptan (IMITREX) 100 MG tablet TAKE 1 TABLET BY MOUTH. MAY TAKE REPEAT WITH 1 TABLET IN TWO HOURS IF NEEDED  5  . topiramate (TOPAMAX) 25 MG tablet TAKE 7-8 TABLETS AT BEDTIME  0     PAST MEDICAL HISTORY: Past Medical History  Diagnosis Date  . Bipolar disorder ([place])   . Anxiety   . Nausea   . Neck pain   . Migraines   . Chronic pain   . Back pain   . OCD (obsessive compulsive disorder)   . Asthma   . Nasal polyps   . Allergic rhinitis     PAST SURGICAL HISTORY: Past Surgical History  Procedure Laterality Date  . Nasal sinus surgery    . Wisdom tooth extraction      FAMILY HISTORY: Family History  Problem Relation Age of Onset  . Arthritis Maternal Grandmother   . Arthritis Maternal Grandfather   . Depression Paternal Grandmother   . Healthy Father   . Healthy Mother     SOCIAL HISTORY:  Social History   Social History  . Marital Status: Married    Spouse Name: N/A  . Number of Children: 0  . Years of Education: 14   Occupational History  . Unemployed    Social History Main Topics  . Smoking status: Current Every Day Smoker -- 0.50 packs/day    Types: Cigarettes  . Smokeless tobacco: Not on file  . Alcohol Use: 0.0 oz/week    0 Standard drinks or equivalent per week     Comment: Occasional alcohol use  . Drug Use: No  . Sexual Activity: Yes    Birth Control/ Protection: Condom   Other Topics Concern  . Not on file   Social History Narrative   Lives at home alone.   Right-handed.   3 glasses of green tea per day.       PHYSICAL EXAM   Filed Vitals:   09/04/15 1310  BP: 123/75  Pulse: 75  Height: 5\' 11"  (1.803 m)  Weight: 165 lb 4 oz (74.957 kg)    Not recorded      Body mass index is 23.06 kg/(m^2).  PHYSICAL EXAMNIATION:  Gen: NAD, conversant, well nourised, obese, well groomed                     Cardiovascular: Regular rate rhythm, no peripheral edema, warm, nontender. Eyes: Conjunctivae clear  without exudates or hemorrhage Neck: Supple, no carotid bruise. Pulmonary: Clear to auscultation bilaterally   NEUROLOGICAL EXAM:  MENTAL STATUS: Speech:    Speech is normal; fluent and spontaneous with normal comprehension.  Cognition:     Orientation to time, place and person     Normal recent and remote memory     Normal Attention span and concentration     Normal Language, naming, repeating,spontaneous speech     Fund of knowledge   CRANIAL NERVES: CN II: Visual fields are full to confrontation. Fundoscopic exam is normal with sharp discs and no vascular changes. Pupils are round equal and briskly reactive to light. CN III, IV, VI: extraocular movement are normal. No ptosis. CN V: Facial  sensation is intact to pinprick in all 3 divisions bilaterally. Corneal responses are intact.  CN VII: Face is symmetric with normal eye closure and smile. CN VIII: Hearing is normal to rubbing fingers CN IX, X: Palate elevates symmetrically. Phonation is normal. CN XI: Head turning and shoulder shrug are intact CN XII: Tongue is midline with normal movements and no atrophy.  MOTOR: There is no pronator drift of out-stretched arms. Muscle bulk and tone are normal. Muscle strength is normal.  REFLEXES: Reflexes are 2+ and symmetric at the biceps, triceps, knees, and ankles. Plantar responses are flexor.  SENSORY: Intact to light touch, pinprick, position sense, and vibration sense are intact in fingers and toes.  COORDINATION: Rapid alternating movements and fine finger movements are intact. There is no dysmetria on finger-to-nose and heel-knee-shin.    GAIT/STANCE: Posture is normal. Gait is steady with normal steps, base, arm swing, and turning. Heel and toe walking are normal. Tandem gait is normal.  Romberg is absent.  DIAGNOSTIC DATA (LABS, IMAGING, TESTING) - I reviewed patient records, labs, notes, testing and imaging myself where available.   ASSESSMENT AND PLAN  SYMARIA SINDONI is a 30 y.o. female    Chronic migraine headaches with aura  She has tried and failed multiple different preventive medications in the past, currently is taking Topamax, previously has failed beta blocker, calcium channel blocker  She is on polypharmacy treatment latuda, and Abilify, Xanax, seroquel, escopiclone  Imitrex tablet 100 mg as needed was helpful but often she has to take multiple dose  I wrote Imitrex injection today  This is her first Botox injection as migraine prevention, potential side effect and procedures procedure was explained, consent form was signed.  BOTOX injection was performed according to protocol by [facility].     Corrugator 2 sites, 10 units Procerus 1 site, 5 unit Frontalis 4 sites,  20 units, Temporalis 8 sites,  40 units  Occipitalis 6 sites, 30 units Cervical Paraspinal, 4 sites, 20 units Trapezius, 6 sites, 30 units  Extra 45 units was injected along bilateral masseters 10 units each, 10 units at right parietal, 15 units at left parietal region,  Patient tolerate the injection well. Will return for repeat injection in 3 months.  Marcial Pacas, M.D. Ph.D.  [facility] [address] [place], [place] [address] Ph: [phone] Fax: [phone]  CC: To Dr. [name], Dr. Nelva Bush, Delfino Lovett

## 2015-11-24 ENCOUNTER — Encounter (HOSPITAL_COMMUNITY): Payer: Self-pay | Admitting: *Deleted

## 2015-11-24 ENCOUNTER — Telehealth: Payer: Self-pay | Admitting: Neurology

## 2015-11-24 ENCOUNTER — Emergency Department (HOSPITAL_COMMUNITY)
Admission: EM | Admit: 2015-11-24 | Discharge: 2015-11-25 | Disposition: A | Discharge status: Home / Self Care | Payer: BLUE CROSS/BLUE SHIELD | Attending: Emergency Medicine | Admitting: Emergency Medicine

## 2015-11-24 DIAGNOSIS — G43719 Chronic migraine without aura, intractable, without status migrainosus: Secondary | ICD-10-CM

## 2015-11-24 DIAGNOSIS — Z79899 Other long term (current) drug therapy: Secondary | ICD-10-CM | POA: Insufficient documentation

## 2015-11-24 DIAGNOSIS — T43211A Poisoning by selective serotonin and norepinephrine reuptake inhibitors, accidental (unintentional), initial encounter: Secondary | ICD-10-CM | POA: Insufficient documentation

## 2015-11-24 DIAGNOSIS — F17210 Nicotine dependence, cigarettes, uncomplicated: Secondary | ICD-10-CM | POA: Insufficient documentation

## 2015-11-24 DIAGNOSIS — T50995A Adverse effect of other drugs, medicaments and biological substances, initial encounter: Secondary | ICD-10-CM | POA: Diagnosis not present

## 2015-11-24 DIAGNOSIS — F1990 Other psychoactive substance use, unspecified, uncomplicated: Secondary | ICD-10-CM

## 2015-11-24 DIAGNOSIS — J45909 Unspecified asthma, uncomplicated: Secondary | ICD-10-CM | POA: Diagnosis not present

## 2015-11-24 DIAGNOSIS — R519 Headache, unspecified: Secondary | ICD-10-CM

## 2015-11-24 DIAGNOSIS — G8929 Other chronic pain: Secondary | ICD-10-CM

## 2015-11-24 DIAGNOSIS — Y829 Unspecified medical devices associated with adverse incidents: Secondary | ICD-10-CM | POA: Insufficient documentation

## 2015-11-24 DIAGNOSIS — T887XXA Unspecified adverse effect of drug or medicament, initial encounter: Secondary | ICD-10-CM | POA: Insufficient documentation

## 2015-11-24 DIAGNOSIS — R51 Headache: Secondary | ICD-10-CM

## 2015-11-24 DIAGNOSIS — F1910 Other psychoactive substance abuse, uncomplicated: Secondary | ICD-10-CM

## 2015-11-24 LAB — CBC
HEMATOCRIT: 39.7 % (ref 36.0–46.0)
HEMOGLOBIN: 13.2 g/dL (ref 12.0–15.0)
MCH: 30.1 pg (ref 26.0–34.0)
MCHC: 33.2 g/dL (ref 30.0–36.0)
MCV: 90.6 fL (ref 78.0–100.0)
Platelets: 185 10*3/uL (ref 150–400)
RBC: 4.38 MIL/uL (ref 3.87–5.11)
RDW: 12.5 % (ref 11.5–15.5)
WBC: 5.3 10*3/uL (ref 4.0–10.5)

## 2015-11-24 LAB — COMPREHENSIVE METABOLIC PANEL
ALBUMIN: 4.2 g/dL (ref 3.5–5.0)
ALT: 12 U/L — ABNORMAL LOW (ref 14–54)
AST: 18 U/L (ref 15–41)
Alkaline Phosphatase: 49 U/L (ref 38–126)
Anion gap: 5 (ref 5–15)
BILIRUBIN TOTAL: 1 mg/dL (ref 0.3–1.2)
BUN: 13 mg/dL (ref 6–20)
CHLORIDE: 104 mmol/L (ref 101–111)
CO2: 28 mmol/L (ref 22–32)
Calcium: 9.1 mg/dL (ref 8.9–10.3)
Creatinine, Ser: 0.73 mg/dL (ref 0.44–1.00)
GFR calc Af Amer: >60 mL/min (ref >60)
GFR calc non Af Amer: >60 mL/min (ref >60)
GLUCOSE: 100 mg/dL — AB (ref 65–99)
POTASSIUM: 3.8 mmol/L (ref 3.5–5.1)
SODIUM: 137 mmol/L (ref 135–145)
TOTAL PROTEIN: 7 g/dL (ref 6.5–8.1)

## 2015-11-24 LAB — ACETAMINOPHEN LEVEL

## 2015-11-24 LAB — CBG MONITORING, ED: GLUCOSE-CAPILLARY: 88 mg/dL (ref 65–99)

## 2015-11-24 LAB — RAPID URINE DRUG SCREEN, HOSP PERFORMED
AMPHETAMINES: POSITIVE — AB
BARBITURATES: NOT DETECTED
BENZODIAZEPINES: POSITIVE — AB
COCAINE: NOT DETECTED
Opiates: POSITIVE — AB
Tetrahydrocannabinol: NOT DETECTED

## 2015-11-24 LAB — SALICYLATE LEVEL: Salicylate Lvl: <4 mg/dL (ref 2.8–30.0)

## 2015-11-24 LAB — ETHANOL: Alcohol, Ethyl (B): <5 mg/dL (ref <5)

## 2015-11-24 MED ORDER — KETOROLAC TROMETHAMINE 30 MG/ML IJ SOLN
30.0000 mg | Freq: Once | INTRAMUSCULAR | Status: AC
  Administered 2015-11-24: 30 mg via INTRAVENOUS
  Filled 2015-11-24: qty 1

## 2015-11-24 MED ORDER — DIPHENHYDRAMINE HCL 50 MG/ML IJ SOLN
12.5000 mg | Freq: Once | INTRAMUSCULAR | Status: AC
  Administered 2015-11-24: 12.5 mg via INTRAVENOUS
  Filled 2015-11-24: qty 1

## 2015-11-24 MED ORDER — PROCHLORPERAZINE EDISYLATE 5 MG/ML IJ SOLN
10.0000 mg | Freq: Once | INTRAMUSCULAR | Status: AC
Start: 2015-11-24 — End: 2015-11-24
  Administered 2015-11-24: 10 mg via INTRAVENOUS
  Filled 2015-11-24: qty 2

## 2015-11-24 NOTE — ED Notes (Signed)
Bed: RESB Expected date:  Expected time:  Means of arrival:  Comments: EMS 30 yo female intention overdose

## 2015-11-24 NOTE — ED Notes (Signed)
TTS at bedside for bedside

## 2015-11-24 NOTE — BH Assessment (Signed)
Tele Assessment Note   Carmen Daniels is an 30 y.o. female who presents to the ED by [facility]. Pt reports to taking an OD on medication today however she states it was not a suicide attempt and she did it because she wanted to sleep. Per mother pt was sending text message to mother, friends, and ex husband stating, "She will see them in the after life and that she can't handle life any more." Per EMS pt states she has been depressed about divorce. Admitted to taking 4 Seroquel 25 mg, 4 Lunesta, 4 trazadone, 7 topamax 25 mg. Pt reports she has constant migraines and the doctors have not been able to figure out what is causing the migraines. Pt reports she becomes overwhelmed with pain and anxiety due to the migraines and sometimes she has nosebleeds, loses her vision, and passes out due to the migraines. Pt denies taking the pills to kill herself and states she has attempted suicide in the past in 2009 in which she slit her wrist however she reports this was not a suicide attempt. Pt reports she "just wanted to sleep."  Pt reports she has a history of depression, bipolar disorder, and anxiety. Pt reports she sees a psychiatrist every 3 months and last saw a therapist in 2016. Pt denies H/I and reports she is not experiencing A/V hallucinations. Pt reports recent stressors in her life include her constant migranes and her divorce that took place several months ago.  Pt reports she has anxiety attacks at least weekly and reports they are triggered by outside factors but also her migraines. Pt endorses prior self-harming behaviors including cutting her arms but states she has not done that in several years. Pt reports she has seen several doctors and they have been unable to assist with her migraines which causes a significant amount of pain and discomfort so she wanted to take  The pills "just to get some sleep."  Per Carmen Clan, PA pt meets inpt criteria. [facility] bed placement is pending per Carmen Daniels, Therapist, sports . Charge nurse  Terri, RN has been notified of the recommended disposition.   Diagnosis: Bipolar Disorder   Past Medical History:  Past Medical History:  Diagnosis Date   Allergic rhinitis    Anxiety    Asthma    Back pain    Bipolar disorder ([facility])    Chronic pain    Migraines    Nasal polyps    Nausea    Neck pain    OCD (obsessive compulsive disorder)     Past Surgical History:  Procedure Laterality Date   NASAL SINUS SURGERY     WISDOM TOOTH EXTRACTION      Family History:  Family History  Problem Relation Age of Onset   Healthy Father    Healthy Mother    Arthritis Maternal Grandmother    Arthritis Maternal Grandfather    Depression Paternal Grandmother     Social History:  reports that she has been smoking Cigarettes.  She has been smoking about 0.50 packs per day. She has never used smokeless tobacco. She reports that she drinks alcohol. She reports that she does not use drugs.  Additional Social History:  Alcohol / Drug Use Pain Medications: pt denies abuse  Prescriptions: pt admits to taking medication to help with sleep but states she was not attempting suicide  Over the Counter: pt admits to taking medication to help with sleep but states she was not attempting suicide  History of alcohol / drug  use?: No history of alcohol / drug abuse  CIWA: CIWA-Ar BP: 96/56 Pulse Rate: 77 COWS:    PATIENT STRENGTHS: (choose at least two) Average or above average intelligence Communication skills Motivation for treatment/growth Supportive family/friends  Allergies:  Allergies  Allergen Reactions   Keflex [Cephalexin] Anaphylaxis   Flagyl [Metronidazole]     Home Medications:  (Not in a hospital admission)  OB/GYN Status:  No LMP recorded.  General Assessment Data Location of Assessment: [facility] ED TTS Assessment: In system Is this a Tele or Face-to-Face Assessment?: Face-to-Face Is this an Initial Assessment or a Re-assessment for this encounter?:  Initial Assessment Marital status: Divorced Carmen Daniels name: Carmen Daniels Is patient pregnant?: No Pregnancy Status: No Living Arrangements: Alone Can pt return to current living arrangement?: Yes Admission Status: Voluntary Is patient capable of signing voluntary admission?: Yes Referral Source: Self/Family/Friend Insurance type: Building services engineer Care Plan Living Arrangements: Alone Name of Psychiatrist: Pollie Daniels Name of Therapist: Girard Cooter A. Daniels, Carmen Daniels  Education Status Is patient currently in school?: No Highest grade of school patient has completed: Associates  Risk to self with the past 6 months Suicidal Ideation: No Has patient been a risk to self within the past 6 months prior to admission? : No Suicidal Intent: No Has patient had any suicidal intent within the past 6 months prior to admission? : No Is patient at risk for suicide?: No Suicidal Plan?: No Has patient had any suicidal plan within the past 6 months prior to admission? : No Access to Means: No What has been your use of drugs/alcohol within the last 12 months?: denies Previous Attempts/Gestures: Yes How many times?: 1 Triggers for Past Attempts: Unknown Intentional Self Injurious Behavior: Cutting Comment - Self Injurious Behavior: pt reports she used to cut herself but has not done this in years Family Suicide History: No Recent stressful life event(s): Divorce, Other (Comment) (migranes, anxiety) Persecutory voices/beliefs?: No Depression: Yes Depression Symptoms: Fatigue, Loss of interest in usual pleasures, Isolating Substance abuse history and/or treatment for substance abuse?: No Suicide prevention information given to non-admitted patients: Not applicable  Risk to Others within the past 6 months Homicidal Ideation: No Does patient have any lifetime risk of violence toward others beyond the six months prior to admission? : No Thoughts of Harm to Others: No Current Homicidal Intent:  No Current Homicidal Plan: No Access to Homicidal Means: No History of harm to others?: No Assessment of Violence: None Noted Does patient have access to weapons?: No Criminal Charges Pending?: No Does patient have a court date: No Is patient on probation?: No  Psychosis Hallucinations: None noted Delusions: None noted  Mental Status Report Appearance/Hygiene: In scrubs, Unremarkable Eye Contact: Good Motor Activity: Freedom of movement Speech: Logical/coherent Level of Consciousness: Drowsy Mood: Anxious Affect: Anxious, Flat Anxiety Level: Panic Attacks Most recent panic attack: pt reports the past week Thought Processes: Coherent, Relevant Judgement: Partial Orientation: Place, Time, Person, Situation, Appropriate for developmental age Obsessive Compulsive Thoughts/Behaviors: None  Cognitive Functioning Concentration: Normal Memory: Recent Intact, Remote Impaired IQ: Average Insight: Fair Impulse Control: Poor Appetite: Good Sleep: Decreased Total Hours of Sleep: 4 Vegetative Symptoms: Staying in bed  ADLScreening [facility] Assessment Services) Patient's cognitive ability adequate to safely complete daily activities?: Yes Patient able to express need for assistance with ADLs?: Yes Independently performs ADLs?: Yes (appropriate for developmental age)  Prior Inpatient Therapy Prior Inpatient Therapy: Yes Prior Therapy Dates: 2009 Prior Therapy Facilty[facility]Provider[facility] Reason for Treatment: suicide attempt  Prior Outpatient Therapy Prior Outpatient Therapy: Yes Prior Therapy Dates: current Prior Therapy Facilty/Provider(s): [facility], Carmen Daniels Reason for Treatment: anxiety, bipolar, trauma Does patient have an ACCT team?: No Does patient have Intensive In-House Services?  : No Does patient have [facility] services? : No Does patient have P4CC services?: No  ADL Screening (condition at time of admission) Patient's cognitive ability adequate to safely complete daily  activities?: Yes Is the patient deaf or have difficulty hearing?: No Does the patient have difficulty seeing, even when wearing glasses/contacts?: No Does the patient have difficulty concentrating, remembering, or making decisions?: No Patient able to express need for assistance with ADLs?: Yes Does the patient have difficulty dressing or bathing?: No Independently performs ADLs?: Yes (appropriate for developmental age) Does the patient have difficulty walking or climbing stairs?: No Weakness of Legs: None Weakness of Arms/Hands: None  Home Assistive Devices/Equipment Home Assistive Devices/Equipment: None    Abuse/Neglect Assessment (Assessment to be complete while patient is alone) Physical Abuse: Denies Verbal Abuse: Denies Sexual Abuse: Denies Exploitation of patient/patient's resources: Denies Self-Neglect: Denies     Regulatory affairs officer (For Healthcare) Does patient have an advance directive?: No Would patient like information on creating an advanced directive?: No - patient declined information    Additional Information 1:1 In Past 12 Months?: No CIRT Risk: No Elopement Risk: No Does patient have medical clearance?: Yes     Disposition:  Disposition Initial Assessment Completed for this Encounter: Yes  Lyanne Co 11/24/2015 11:30 PM

## 2015-11-24 NOTE — ED Notes (Addendum)
Family at bedside: mother and father

## 2015-11-24 NOTE — Addendum Note (Signed)
Addended by: Marcial Pacas on: 11/24/2015 12:55 PM   Modules accepted: Orders

## 2015-11-24 NOTE — Telephone Encounter (Signed)
Please let patient know, I have reordered MRI of the brain with and without contrast,

## 2015-11-24 NOTE — ED Notes (Addendum)
Ebony Hail from poison control called for pt. Status and condition. Notified that no charcoal was administered. Recommend EKG.

## 2015-11-24 NOTE — ED Notes (Signed)
Spoke with Carmen Daniels from [employer]: The medications the patient took are not overdose: Medical clearance labs 12 lead to see if QTc is prolonged

## 2015-11-24 NOTE — ED Notes (Signed)
Clerance [id number] [phone] (father)

## 2015-11-24 NOTE — Telephone Encounter (Signed)
Mom called to advise, last night around 8 or 9pm patient had sudden blinding headache, both sides of nose started bleeding, fainted, hit head on bathroom floor, states this has happened 2 or 3 times over the past 4-5 months.

## 2015-11-24 NOTE — Telephone Encounter (Signed)
Left message for a return call

## 2015-11-24 NOTE — Telephone Encounter (Signed)
Patient aware to expect call for scheduling.

## 2015-11-24 NOTE — Telephone Encounter (Addendum)
Spoke to patient - headache has improved - she denies symptoms of confusion, memory loss, ringing ears, increased drowsiness, nausea, vomiting, or lack of coordination.  Educated her on concussion signs and symptoms.  She has a pending appt on 12/04/15 for Botox (migraines).  She would like to now proceed with MRI brain w/wo, ordered by Dr. Krista Blue last year.

## 2015-11-24 NOTE — ED Provider Notes (Signed)
[facility] DEPT Provider Note   CSN: [id number] Arrival date & time: 11/24/15  2005 By signing my name below, I, Dyke Brackett, attest that this documentation has been prepared under the direction and in the presence of non-physician practitioner, Antonietta Breach, PA-C Electronically Signed: Dyke Brackett, Scribe. 11/24/2015. 9:00 PM.    History   Chief Complaint Chief Complaint  Patient presents with  . Drug Overdose   HPI Carmen Daniels is a 30 y.o. female with hx of bipolar disorder, anxiety, OCD, migraines, and chronic pain brought in by ambulance who presents to the Emergency Department s/p drug overdose tonight PTA. Pt took 4 seroquel 25 mg, 4 Lunesta, 4 Trazadone, and 7 Topamax 25 mg tonight around 7 pm. She states she took these medications to alleviate her headache pain. 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 Pt states she has had a constant migraine today which she rates 8/10 in severity. She denies any drug or alcohol use. Pt denies any suicidal or homicidal ideation. She was hospitalized about 10 years ago for a suicide attempt.   The history is provided by the patient and medical records. No language interpreter was used.    Past Medical History:  Diagnosis Date  . Allergic rhinitis   . Anxiety   . Asthma   . Back pain   . Bipolar disorder ([place])   . Chronic pain   . Migraines   . Nasal polyps   . Nausea   . Neck pain   . OCD (obsessive compulsive disorder)     Patient Active Problem List   Diagnosis Date Noted  . Chronic migraine without aura 03/27/2015  . Mild persistent asthma 12/30/2014  . Allergic rhinitis due to pollen 12/30/2014  . 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 Nasal polyposis 12/30/2014    Past Surgical History:  Procedure Laterality Date  . NASAL SINUS SURGERY    . WISDOM TOOTH EXTRACTION      OB  History    No data available      Home Medications    Prior to Admission medications   Medication Sig Start Date End Date Taking? Authorizing Provider  ALPRAZolam Duanne Moron) 1 MG tablet Take 1 mg by mouth 4 (four) times daily. 09/16/14  Yes Historical Provider, MD  ARIPiprazole (ABILIFY) 30 MG tablet Take 30 mg by mouth daily. 08/27/14  Yes Historical Provider, MD  Azelastine-Fluticasone (DYMISTA) 137-50 MCG/ACT SUSP Place 1 spray into both nostrils 2 (two) times daily.   Yes Historical Provider, MD  beclomethasone (QVAR) 40 MCG/ACT inhaler Inhale 1 puff into the lungs 2 (two) times daily.   Yes Historical Provider, MD  Eszopiclone 3 MG TABS Take 3 mg by mouth at bedtime. 08/24/14  Yes Historical Provider, MD  lurasidone (LATUDA) 40 MG TABS tablet Take 40 mg by mouth every evening.   Yes Historical Provider, MD  ondansetron (ZOFRAN ODT) 4 MG disintegrating tablet Take 1 tablet (4 mg total) by mouth every 8 (eight) hours as needed for nausea or vomiting. 09/04/15  Yes Marcial Pacas, MD  QUEtiapine (SEROQUEL) 25 MG tablet Take 25 mg by mouth at bedtime. 09/12/14  Yes Historical Provider, MD  SUMAtriptan (IMITREX) 100 MG tablet Take 1 tablet (100 mg total) by mouth once. May repeat in 2 hours if headache persists or recurs. Patient taking differently: Take 100 mg by mouth  every 2 (two) hours as needed for migraine or headache. May repeat in 2 hours if headache persists or recurs. 03/27/15  Yes Marcial Pacas, MD  topiramate (TOPAMAX) 25 MG tablet TAKE 7-8 TABLETS AT BEDTIME 09/12/14  Yes Historical Provider, MD  VENTOLIN HFA 108 (90 BASE) MCG/ACT inhaler INHALE 2 PUFFS EVERY 4-6HRS AS NEEDED FOR COUGH/WHEEZE MAY USE 2PUFFS 10-20MINS PRIOR TO EXERCISE 01/09/15  Yes Charlies Silvers, MD  HYDROcodone-acetaminophen (NORCO) 10-325 MG per tablet Take 1 tablet by mouth 2 (two) times daily as needed. for pain 09/16/14   Historical Provider, MD  promethazine (PHENERGAN) 25 MG tablet Take 25 mg by mouth 3 (three) times daily as  needed. 09/16/14   Historical Provider, MD  SUMAtriptan 6 MG/0.5ML SOAJ Inject 6mg  at onset of migraine. May repeat in 2 hours, if needed.  Max of 2 injections in 24 hours. 05/08/15   Marcial Pacas, MD    Family History Family History  Problem Relation Age of Onset  . Healthy Father   . Healthy Mother   . Arthritis Maternal Grandmother   . Arthritis Maternal Grandfather   . Depression Paternal Grandmother     Social History Social History  Substance Use Topics  . Smoking status: Current Every Day Smoker    Packs/day: 0.50    Types: Cigarettes  . Smokeless tobacco: Never Used  . Alcohol use 0.0 oz/week     Comment: Occasional alcohol use     Allergies   Keflex [cephalexin] and Flagyl [metronidazole]   Review of Systems Review of Systems 10 systems reviewed and all are negative for acute change except as noted in the HPI.   Physical Exam Updated Vital Signs BP (!) 92/54   Pulse 63   Temp 97.7 F (36.5 C) (Oral)   Resp 13   Ht 5\' 11"  (1.803 m)   Wt 72.6 kg   SpO2 94%   BMI 22.32 kg/m   Physical Exam  Constitutional: She is oriented to person, place, and time. She appears well-developed and well-nourished. No distress.  Nontoxic appearing and in no distress.  HENT:  Head: Normocephalic and atraumatic.  Mouth/Throat: Oropharynx is clear and moist.  Eyes: Conjunctivae and EOM are normal. Pupils are equal, round, and reactive to light. No scleral icterus.  Neck: Normal range of motion.  No nuchal rigidity or meningismus  Cardiovascular: Normal rate, regular rhythm and intact distal pulses.   Pulmonary/Chest: Effort normal. No respiratory distress. She has no wheezes. She has no rales.  Musculoskeletal: Normal range of motion.  Neurological: She is alert and oriented to person, place, and time. No cranial nerve deficit. She exhibits normal muscle tone. Coordination normal.  GCS 15.  Speech is goal oriented.  No cranial nerve deficits appreciated; symmetric eyebrow  raise, no facial drooping, tongue midline.  Patient has equal grip strength bilaterally at 5/5 strength against resistance in all major muscle bilaterally.  Sensation to light touch intact.  Patient moves extremities without ataxia.  Skin: Skin is warm and dry. No rash noted. She is not diaphoretic. No erythema. No pallor.  Psychiatric: She has a normal mood and affect. Her behavior is normal.  Nursing note and vitals reviewed.    ED Treatments / Results  DIAGNOSTIC STUDIES:  Oxygen Saturation is 99% on RA, normal by my interpretation.    COORDINATION OF CARE:  8:53 PM Discussed treatment plan with pt at bedside and pt agreed to plan.   Labs (all labs ordered are listed, but only abnormal results are  displayed) Labs Reviewed  COMPREHENSIVE METABOLIC PANEL - Abnormal; Notable for the following:       Result Value   Glucose, Bld 100 (*)    ALT 12 (*)    All other components within normal limits  ACETAMINOPHEN LEVEL - Abnormal; Notable for the following:    Acetaminophen (Tylenol), Serum <10 (*)    All other components within normal limits  URINE RAPID DRUG SCREEN, HOSP PERFORMED - Abnormal; Notable for the following:    Opiates POSITIVE (*)    Benzodiazepines POSITIVE (*)    Amphetamines POSITIVE (*)    All other components within normal limits  ETHANOL  SALICYLATE LEVEL  CBC  CBG MONITORING, ED    EKG  EKG Interpretation  Date/Time:  Monday November 24 2015 20:19:53 EDT Ventricular Rate:  78 PR Interval:    QRS Duration: 89 QT Interval:  411 QTC Calculation: 469 R Axis:   47 Text Interpretation:  Sinus rhythm Borderline short PR interval Since last tracing rate slower Confirmed by Winfred Leeds  MD, SAM [id number]) on 11/24/2015 8:35:56 PM       Radiology No results found.  Procedures Procedures (including critical care time)  Medications Ordered in ED Medications  ketorolac (TORADOL) 30 MG/ML injection 30 mg (30 mg Intravenous Given 11/24/15 2131)  prochlorperazine  (COMPAZINE) injection 10 mg (10 mg Intravenous Given 11/24/15 2131)  diphenhydrAMINE (BENADRYL) injection 12.5 mg (12.5 mg Intravenous Given 11/24/15 2131)     Initial Impression / Assessment and Plan / ED Course  I have reviewed the triage vital signs and the nursing notes.  Pertinent labs & imaging results that were available during my care of the patient were reviewed by me and considered in my medical decision making (see chart for details).  Clinical Course    11:35 PM Labs reviewed. Patient medically cleared. She reports significant improvement in her headache. She is resting comfortably, in NAD. Pending TTS recommendations.  3:02 AM Patient seen by TTS. TTS initially recommending inpatient treatment; however, patient does not wish to stay inpatient. She continues to report that her improper use of medication tonight was to alleviate her pain; she continues to deny SI. She is calm and cooperative. From my encounters with the patient, I do not believe she is a harm to herself or others. I do not see indication for IVC and believe she would be appropriate for discharge following the signing of a no harm contract. This has been discussed with TTS and Patriciaann Clan, midlevel, as well who are agreeable to plan. Patient with stable vitals signs; discharged in satisfactory condition.   Final Clinical Impressions(s) / ED Diagnoses   Final diagnoses:  Chronic nonintractable headache, unspecified headache type  Misuse of prescription only drugs    New Prescriptions New Prescriptions   No medications on file    I personally performed the services described in this documentation, which was scribed in my presence. The recorded information has been reviewed and is accurate.      Antonietta Breach, PA-C 11/25/15 [id number]    Daleen Bo, MD 11/25/15 2123

## 2015-11-24 NOTE — ED Triage Notes (Addendum)
Pt was BIB EMS for OD. Per mother she was sending text message to mother, friends, and ex husband stating, "She will see them in the after life and that she can't handle life any more." She has chronic pain and migraines, h/o Suicide attempt, cutting, and depression. Per EMS pt states she has been depressed about divorce. Admitted to taking 4 Seroquel 25 mg, 4 Lunesta, 4 trazadone,  7 topamax 25 mg. She states her physician allows her to take up to 7 Topamax. She took these medications within 45 minutes of each other around 1900 In route, pt was given Zofran 4 mg for nausea.

## 2015-11-25 NOTE — BH Assessment (Signed)
Assessor spoke with Antonietta Breach, PA-C who advised she would d/c pt due to pt completing no-harm contract.  Lind Covert, MSW, Latanya Presser

## 2015-11-25 NOTE — Discharge Instructions (Signed)
Taking you medications as they are prescribed to you.  Do not take improper or increased doses of these medicines as it may be harmful to your body. Follow up with your neurologist. If you are having suicidal thoughts or thoughts of harming yourself, return to the ED immediately.

## 2015-11-25 NOTE — ED Notes (Signed)
Sleeping soundly, awakens easily to name called-no distress. MP SR with rate 62 and O2 sat 100% RA

## 2015-11-25 NOTE — ED Notes (Signed)
Patient given written and verbal discharge instructions, to call psychiatrist today to make follow-up appointment. Do not mix medications and take only as prescribed, patient has also signed a Theatre manager. Father to take patient home.

## 2015-11-25 NOTE — BH Assessment (Addendum)
Assessor spoke with pt who refused to sign the voluntary admission and consent for treatment and requested to be d/c. Pt reports she will be staying with her parents and does not feel she is a danger to herself. Spoke with Antonietta Breach, PA-C who was in agreement that the pt can be d/c with "no-harm contract" and follow up with her psychiatrist. Assessor spoke with Antonietta Breach, PA-C and provided the pt with no-harm contract for completion and review.   Lind Covert, MSW, Latanya Presser

## 2015-12-03 ENCOUNTER — Telehealth: Payer: Self-pay | Admitting: Neurology

## 2015-12-03 NOTE — Telephone Encounter (Signed)
Patient's Mother called to cancel appointment for Botox for the patient tomorrow. She states the patient tried to commit suicide the other day and is in an inpatient place at [facility]. She says when the patient is better will call back to reschedule. A returned call is not needed.

## 2015-12-04 ENCOUNTER — Ambulatory Visit: Payer: BLUE CROSS/BLUE SHIELD | Admitting: Neurology

## 2015-12-10 NOTE — Telephone Encounter (Signed)
Pt's mother called and r/s botox appt- 11/8 @ 3:30

## 2015-12-12 NOTE — Telephone Encounter (Signed)
Returned call and left VM.

## 2015-12-15 ENCOUNTER — Telehealth: Payer: Self-pay | Admitting: Neurology

## 2015-12-15 NOTE — Telephone Encounter (Signed)
[place]/[facility] [phone] called needing insurance information. She said there was a verbal but needs insurance. Please call asap so she can start a clm for botox

## 2015-12-16 ENCOUNTER — Telehealth: Payer: Self-pay | Admitting: Neurology

## 2015-12-16 NOTE — Telephone Encounter (Signed)
Pt returned Danielle's call. Confirmed 11/8 botox appt

## 2015-12-23 NOTE — Telephone Encounter (Signed)
Called [facility] and gave insurance information on Botox they needed. Process has started.

## 2015-12-26 NOTE — Telephone Encounter (Signed)
Patient called, was advised by [place] to contact our office to advise that PA for BOTOX has expired, new PA is needed. Patient advised, call was received earlier from [place], message was sent to [facility].

## 2015-12-26 NOTE — Telephone Encounter (Signed)
[place] [phone] called to request PA for BOTOX 100 units SDV, states previous PA expired August 11th. Please call [phone].

## 2015-12-28 ENCOUNTER — Inpatient Hospital Stay: Admission: RE | Admit: 2015-12-28 | Payer: No Typology Code available for payment source | Source: Ambulatory Visit

## 2015-12-29 ENCOUNTER — Telehealth: Payer: Self-pay | Admitting: Neurology

## 2015-12-29 NOTE — Telephone Encounter (Signed)
[place], and will confirm how she is taking Imitrex.  She should have r/f thru 03/2016/fim

## 2015-12-29 NOTE — Telephone Encounter (Signed)
Patient is returning your call.  

## 2015-12-29 NOTE — Telephone Encounter (Signed)
Patient called to request refill of SUMAtriptan 6 MG/0.5ML SOAJ, states she only has (1) shot left.

## 2015-12-29 NOTE — Telephone Encounter (Signed)
I have spoken with Carmen Daniels this afternoon.  She sts. she has frequent migraines, so is taking Imitrex tablets 3-4 times per week, and also taking Imitrex SQ inj.  3-4 times per week.  We have discussed safe use of triptans and reviewed cardiac implications/rebound h/a's associated with overuse.  She verbalized understanding of same.  Will check with YY  when she returns to the office tomorrow am, to see if it is ok to r/f triptans, or if there are other options available to pt./fim

## 2015-12-30 ENCOUNTER — Telehealth: Payer: Self-pay | Admitting: Neurology

## 2015-12-30 NOTE — Telephone Encounter (Signed)
Patient called to confirm, she got the message regarding CX of her Botox appt.

## 2015-12-30 NOTE — Telephone Encounter (Signed)
Noted Carmen Daniels  °

## 2015-12-30 NOTE — Telephone Encounter (Signed)
Called and left her a message relaying I would have to CX her Botox Apt. Because her medication approval was still Pending. Relayed on Message to call  Andee Poles / Hinton Dyer back and let us to no she received message.

## 2015-12-30 NOTE — Telephone Encounter (Signed)
PA has been submitted and is still pending.

## 2015-12-31 ENCOUNTER — Ambulatory Visit: Payer: Self-pay | Admitting: Neurology

## 2015-12-31 ENCOUNTER — Ambulatory Visit: Payer: BLUE CROSS/BLUE SHIELD | Admitting: Neurology

## 2016-01-05 NOTE — Telephone Encounter (Signed)
Carmen Daniels [id number] [phone] ( Mother) called to schedule botox appt.

## 2016-01-06 ENCOUNTER — Telehealth: Payer: Self-pay | Admitting: Neurology

## 2016-01-06 NOTE — Telephone Encounter (Signed)
This patient's medication will be here tomorrow, is there any where for her to go on the schedule this week since Dr. Krista Blue will be out next week?

## 2016-01-06 NOTE — Telephone Encounter (Signed)
Patient is calling to schedule Botox. She says she has called several times and would like a call back.

## 2016-01-07 NOTE — Telephone Encounter (Signed)
Pt returned Danielle's call. I place pt on hold to [place] and when I went to get pt back on the phone she was not there.

## 2016-01-07 NOTE — Telephone Encounter (Signed)
Carmen Daniels , Dr. Krista Blue Patient is wanting to come this Thursday sometime . Patient relayed her Parents are going out of town and she want's to come before Lunch. I relayed to patient Dr. Krista Blue Has a full schedule and Carmen Daniels and Dr. Krista Blue could not promise anything But I would relay message. Patient said she is having headaches daily.

## 2016-01-07 NOTE — Telephone Encounter (Signed)
Returned patients call, Carmen Daniels spoke with her this morning and scheduled her apt.

## 2016-01-07 NOTE — Telephone Encounter (Signed)
I offered her an appt at 10:30am on 01/08/16 and she declined.  She has been scheduled at 8:30am on 01/20/16.

## 2016-01-13 ENCOUNTER — Telehealth: Payer: Self-pay | Admitting: Neurology

## 2016-01-13 NOTE — Telephone Encounter (Signed)
Called and spoke with pharmacy who stated they were waiting on patients consent. They tried to call her while I was on the phone but she did not answer so they left a message. I called the patient and spoke with her. Gave her the number to the pharmacy.

## 2016-01-20 ENCOUNTER — Encounter: Payer: Self-pay | Admitting: Neurology

## 2016-01-20 ENCOUNTER — Telehealth: Payer: Self-pay | Admitting: Neurology

## 2016-01-20 ENCOUNTER — Encounter: Payer: Self-pay | Admitting: *Deleted

## 2016-01-20 ENCOUNTER — Ambulatory Visit (INDEPENDENT_AMBULATORY_CARE_PROVIDER_SITE_OTHER): Payer: BLUE CROSS/BLUE SHIELD | Admitting: Neurology

## 2016-01-20 VITALS — BP 132/82 | HR 94 | Ht 71.0 in | Wt 162.0 lb

## 2016-01-20 DIAGNOSIS — G43719 Chronic migraine without aura, intractable, without status migrainosus: Secondary | ICD-10-CM

## 2016-01-20 MED ORDER — SUMATRIPTAN SUCCINATE 6 MG/0.5ML ~~LOC~~ SOAJ: SUBCUTANEOUS | 11 refills | Status: DC

## 2016-01-20 MED ORDER — DILTIAZEM HCL ER COATED BEADS 120 MG PO CP24: 120.0000 mg | ORAL_CAPSULE | Freq: Every day | ORAL | 11 refills | Status: DC

## 2016-01-20 NOTE — Progress Notes (Signed)
**  Botox 100 units x 2 vials, Lot [id number], Exp 06/2018, [place] [id number], specialty pharmacy.//mck,rn**

## 2016-01-20 NOTE — Progress Notes (Signed)
Chief Complaint  Patient presents with  . Migraine    Botox 200 units - specialty pharmacy      PATIENT: Carmen Daniels DOB: [birth year]  Chief Complaint  Patient presents with  . Migraine    Botox 200 units - specialty pharmacy     HISTORICAL  Carmen Daniels is a 30 years old right-handed female, accompanied by her mother,, seen in refer by her pain management doctor Dr. Nelva Bush for evaluation of migraines  I have reviewed most recent office note in April 2016, she has past medical history of bipolar disorder, complains of frequent headaches, diffuse body achy pain UDS was negative,   She complains of migraine headaches since 2011, her typical migraine started with tunnel vision, lasting for 10-15 minutes, followed by severe pounding headache with associated light noise sensitivity, sumatriptan 100 mg as needed has been very helpful, but sometimes she require repeat dosage. She is now having 4-5 typical prolonged migraines each week, lasting for 1 day,  Trigger for her migraines are stress, sleep deprivation,  She has tried over-the-counter aspirin, Tylenol, NSAIDs Excedrin Migraine, without helping her headaches, she has seen different neurologists in the past, has tried different preventive medications, including beta blocker, calcium channel blocker without helping, currently she is taking Topamax 25 mg 7 to 8 tablets every night, which is adjusted by her psychologist Dr. 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 No white matter disease or large vessel/lacunar infarction.  UPDATE Mar 27 2015: She is now taking Topamax 25 mg tablets 8 tablets every night, still has frequent migraine headaches, also consider Botox injection as migraine prevention,  she has variable frequency of migraine, few weeks ago, she had 3 weeks of daily headaches, Imitrex as needed is helpful in 60 minutes, she still has dull achy pain, but no longer has the intense headaches.  used up all 9 tablets of allowed Imitrex each month. She usually has visual aura proceeding each migraine headaches,  She is on polypharmacy for her mood disorder, currently taking Topamax, latuda, seroquel, Xanax, Abilify  UPDATE September 04 2015: She had her first Botox injection as migraine prevention in May 05 2015, she had frequent headache postinjection for 2 weeks, eventually she noticed a benefit, she has much less, less severe migraine, but even with the injection she has 2-3 migraines each week, noticed wearing off at the end of 3 months.  UPDATE Nov 28th 2017: She is having frequent headaches, couple times a week, reported 50% improvement with Botox injection, has much less frequent and less severe headaches. Imitrex injection works well    She continued to suffer significant depression anxiety, under close supervision of psychologist,  REVIEW OF SYSTEMS: Full 14 system review of systems performed and notable only for as above  ALLERGIES: Allergies  Allergen Reactions  . Keflex [Cephalexin] Anaphylaxis  . Flagyl [Metronidazole]     HOME MEDICATIONS: Current Outpatient Prescriptions  Medication Sig Dispense Refill  . ALPRAZolam (XANAX) 1 MG tablet Take 1 mg by mouth 4 (four) times daily.  2  . ARIPiprazole (ABILIFY) 30 MG tablet Take 30 mg by mouth daily.  3  . Eszopiclone 3 MG TABS Take 3 mg by mouth at bedtime.  3  . HYDROcodone-acetaminophen (NORCO) 10-325 MG per tablet Take 1 tablet by mouth  2 (two) times daily as needed. for pain  0  . LATUDA 40 MG TABS tablet TAKE 1 TABLET EVERY EVENING WITH FOOD  1  . promethazine (PHENERGAN) 25 MG tablet Take 25 mg by mouth 3 (three) times daily as needed.  0  . QUEtiapine (SEROQUEL) 25 MG tablet Take 25 mg by mouth at bedtime.  2  .  SUMAtriptan (IMITREX) 100 MG tablet TAKE 1 TABLET BY MOUTH. MAY TAKE REPEAT WITH 1 TABLET IN TWO HOURS IF NEEDED  5  . topiramate (TOPAMAX) 25 MG tablet TAKE 7-8 TABLETS AT BEDTIME  0     PAST MEDICAL HISTORY: Past Medical History:  Diagnosis Date  . Allergic rhinitis   . Anxiety   . Asthma   . Back pain   . Bipolar disorder ([place])   . Chronic pain   . Migraines   . Nasal polyps   . Nausea   . Neck pain   . OCD (obsessive compulsive disorder)     PAST SURGICAL HISTORY: Past Surgical History:  Procedure Laterality Date  . NASAL SINUS SURGERY    . WISDOM TOOTH EXTRACTION      FAMILY HISTORY: Family History  Problem Relation Age of Onset  . Healthy Father   . Healthy Mother   . Arthritis Maternal Grandmother   . Arthritis Maternal Grandfather   . Depression Paternal Grandmother     SOCIAL HISTORY:  Social History   Social History  . Marital status: Married    Spouse name: N/A  . Number of children: 0  . Years of education: 14   Occupational History  . Unemployed    Social History Main Topics  . Smoking status: Current Every Day Smoker    Packs/day: 0.50    Types: Cigarettes  . Smokeless tobacco: Never Used  . Alcohol use 0.0 oz/week     Comment: Occasional alcohol use  . Drug use: No  . Sexual activity: Yes    Birth control/ protection: Condom   Other Topics Concern  . Not on file   Social History Narrative   Lives at home alone.   Right-handed.   3 glasses of green tea per day.       PHYSICAL EXAM   Vitals:   01/20/16 0830  BP: 132/82  Pulse: 94  Weight: 162 lb (73.5 kg)  Height: 5\' 11"  (1.803 m)    Not recorded      Body mass index is 22.59 kg/m.  PHYSICAL EXAMNIATION:  Gen: NAD, conversant, well nourised, obese, well groomed                     Cardiovascular: Regular rate rhythm, no peripheral edema, warm, nontender. Eyes: Conjunctivae clear without exudates or hemorrhage Neck: Supple, no carotid bruise. Pulmonary: Clear to  auscultation bilaterally   NEUROLOGICAL EXAM:  MENTAL STATUS: Speech:    Speech is normal; fluent and spontaneous with normal comprehension.  Cognition:     Orientation to time, place and person     Normal recent and remote memory     Normal Attention span and concentration     Normal Language, naming, repeating,spontaneous speech     Fund of knowledge   CRANIAL NERVES: CN II: Visual fields are full to confrontation. Fundoscopic exam is normal with sharp discs and no vascular changes. Pupils are round equal and briskly reactive to light. CN III, IV, VI: extraocular movement are normal. No ptosis. CN V: Facial sensation is intact to pinprick in all  3 divisions bilaterally. Corneal responses are intact.  CN VII: Face is symmetric with normal eye closure and smile. CN VIII: Hearing is normal to rubbing fingers CN IX, X: Palate elevates symmetrically. Phonation is normal. CN XI: Head turning and shoulder shrug are intact CN XII: Tongue is midline with normal movements and no atrophy.  MOTOR: There is no pronator drift of out-stretched arms. Muscle bulk and tone are normal. Muscle strength is normal.  REFLEXES: Reflexes are 2+ and symmetric at the biceps, triceps, knees, and ankles. Plantar responses are flexor.  SENSORY: Intact to light touch, pinprick, position sense, and vibration sense are intact in fingers and toes.  COORDINATION: Rapid alternating movements and fine finger movements are intact. There is no dysmetria on finger-to-nose and heel-knee-shin.    GAIT/STANCE: Posture is normal. Gait is steady with normal steps, base, arm swing, and turning. Heel and toe walking are normal. Tandem gait is normal.  Romberg is absent.  DIAGNOSTIC DATA (LABS, IMAGING, TESTING) - I reviewed patient records, labs, notes, testing and imaging myself where available.   ASSESSMENT AND PLAN  Carmen Daniels is a 30 y.o. female    Chronic migraine headaches with aura  She has tried and  failed multiple different preventive medications in the past, currently is taking Topamax, previously has failed beta blocker, calcium channel blocker  She is on polypharmacy treatment latuda, and Abilify, Xanax, seroquel, escopiclone  Imitrex tablet 100 mg as needed was helpful but often she has to take multiple dose  refilled Imitrex injection which works well for her   Also add on Cardizem CR 120 mg every night   BOTOX injection was performed according to protocol by [facility].     Corrugator 2 sites, 10 units Procerus 1 site, 5 unit Frontalis 4 sites,  20 units, Temporalis 8 sites,  40 units  Occipitalis 6 sites, 30 units Cervical Paraspinal, 4 sites, 20 units Trapezius, 6 sites, 30 units  Extra 45 units was injected along bilateral masseters 10 units each, 10 units at right parietal, 15 units at left parietal region,  Patient tolerate the injection well.  Will return for repeat injection in 3 months.  Marcial Pacas, M.D. Ph.D.  [facility] [address], [place], [place] [address] Ph: [phone] Fax: [phone]  CC: To Dr. [name], Dr. Nelva Bush, Delfino Lovett

## 2016-01-20 NOTE — Telephone Encounter (Signed)
Pt needs 90 day botox appt scheduled

## 2016-03-28 ENCOUNTER — Other Ambulatory Visit: Payer: Self-pay | Admitting: Neurology

## 2016-04-01 ENCOUNTER — Other Ambulatory Visit: Payer: Self-pay | Admitting: Pediatrics

## 2016-04-21 ENCOUNTER — Encounter: Payer: Self-pay | Admitting: Neurology

## 2016-04-21 ENCOUNTER — Ambulatory Visit (INDEPENDENT_AMBULATORY_CARE_PROVIDER_SITE_OTHER): Payer: Medicaid Other | Admitting: Neurology

## 2016-04-21 VITALS — BP 115/73 | HR 83 | Ht 71.0 in | Wt 176.0 lb

## 2016-04-21 DIAGNOSIS — R930 Abnormal findings on diagnostic imaging of skull and head, not elsewhere classified: Secondary | ICD-10-CM | POA: Diagnosis not present

## 2016-04-21 DIAGNOSIS — G43719 Chronic migraine without aura, intractable, without status migrainosus: Secondary | ICD-10-CM | POA: Diagnosis not present

## 2016-04-21 NOTE — Progress Notes (Signed)
**  Botox 100 units x 2 vials, NDC 0023-1145-01, Lot [id number], Exp 10/2018, specialty pharmacy.//mck,rn** 

## 2016-04-21 NOTE — Progress Notes (Signed)
Chief Complaint  Patient presents with  . Migraines    Botox 100 units x 2 vials - specialty pharmacy.      PATIENT: Carmen Daniels DOB: 1985/10/07  Chief Complaint  Patient presents with  . Migraines    Botox 100 units x 2 vials - specialty pharmacy.     HISTORICAL  Carmen Daniels is a 31 years old right-handed female, accompanied by her mother,, seen in refer by her pain management doctor Dr. Nelva Bush for evaluation of migraines  I have reviewed most recent office note in April 2016, she has past medical history of bipolar disorder, complains of frequent headaches, diffuse body achy pain UDS was negative,   She complains of migraine headaches since 2011, her typical migraine started with tunnel vision, lasting for 10-15 minutes, followed by severe pounding headache with associated light noise sensitivity, sumatriptan 100 mg as needed has been very helpful, but sometimes she require repeat dosage. She is now having 4-5 typical prolonged migraines each week, lasting for 1 day,  Trigger for her migraines are stress, sleep deprivation,  She has tried over-the-counter aspirin, Tylenol, NSAIDs Excedrin Migraine, without helping her headaches, she has seen different neurologists in the past, has tried different preventive medications, including beta blocker, calcium channel blocker without helping, currently she is taking Topamax 25 mg 7 to 8 tablets every night, which is adjusted by her psychologist Dr. 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 No white matter disease or large vessel/lacunar infarction.  UPDATE Mar 27 2015: She is now taking Topamax 25 mg tablets 8 tablets every night, still has frequent migraine headaches, also consider Botox injection as  migraine prevention, she has variable frequency of migraine, few weeks ago, she had 3 weeks of daily headaches, Imitrex as needed is helpful in 60 minutes, she still has dull achy pain, but no longer has the intense headaches.  used up all 9 tablets of allowed Imitrex each month. She usually has visual aura proceeding each migraine headaches,  She is on polypharmacy for her mood disorder, currently taking Topamax, latuda, seroquel, Xanax, Abilify  UPDATE September 04 2015: She had her first Botox injection as migraine prevention in May 05 2015, she had frequent headache postinjection for 2 weeks, eventually she noticed a benefit, she has much less, less severe migraine, but even with the injection she has 2-3 migraines each week, noticed wearing off at the end of 3 months.  UPDATE Nov 28th 2017: She is having frequent headaches, couple times a week, reported 50% improvement with Botox injection, has much less frequent and less severe headaches. Imitrex injection works well    She continued to suffer significant depression anxiety, under close supervision of psychologist,  Update April 21 2016: She did respond to previous injection on January 20 2016, on average she has migraines 3-4 headaches each week, especially at the end of the injection cycle, at the peak of the benefit she has less, also less intense,  We have personally reviewed previous MRI brain in 2015, there was evidence of bilateral temporal pole arachnoid cyst, right worse than left, also intermediate cyst at the pituitary stalk,  She complains of worsening depression  REVIEW OF SYSTEMS: Full 14 system review of systems performed and notable only for as above  ALLERGIES: Allergies  Allergen Reactions  . Keflex [Cephalexin] Anaphylaxis  . Flagyl [Metronidazole]     HOME MEDICATIONS: Current Outpatient Prescriptions  Medication Sig Dispense Refill  . ALPRAZolam (XANAX) 1 MG tablet Take 1 mg by mouth 4 (four) times daily.  2   . ARIPiprazole (ABILIFY) 30 MG tablet Take 30 mg by mouth daily.  3  . Eszopiclone 3 MG TABS Take 3 mg by mouth at bedtime.  3  . HYDROcodone-acetaminophen (NORCO) 10-325 MG per tablet Take 1 tablet by mouth 2 (two) times daily as needed. for pain  0  . LATUDA 40 MG TABS tablet TAKE 1 TABLET EVERY EVENING WITH FOOD  1  . promethazine (PHENERGAN) 25 MG tablet Take 25 mg by mouth 3 (three) times daily as needed.  0  . QUEtiapine (SEROQUEL) 25 MG tablet Take 25 mg by mouth at bedtime.  2  . SUMAtriptan (IMITREX) 100 MG tablet TAKE 1 TABLET BY MOUTH. MAY TAKE REPEAT WITH 1 TABLET IN TWO HOURS IF NEEDED  5  . topiramate (TOPAMAX) 25 MG tablet TAKE 7-8 TABLETS AT BEDTIME  0     PAST MEDICAL HISTORY: Past Medical History:  Diagnosis Date  . Allergic rhinitis   . Anxiety   . Asthma   . Back pain   . Bipolar disorder ([place])   . Chronic pain   . Migraines   . Nasal polyps   . Nausea   . Neck pain   . OCD (obsessive compulsive disorder)     PAST SURGICAL HISTORY: Past Surgical History:  Procedure Laterality Date  . NASAL SINUS SURGERY    . WISDOM TOOTH EXTRACTION      FAMILY HISTORY: Family History  Problem Relation Age of Onset  . Healthy Father   . Healthy Mother   . Arthritis Maternal Grandmother   . Arthritis Maternal Grandfather   . Depression Paternal Grandmother     SOCIAL HISTORY:  Social History   Social History  . Marital status: Married    Spouse name: N/A  . Number of children: 0  . Years of education: 14   Occupational History  . Unemployed    Social History Main Topics  . Smoking status: Current Every Day Smoker    Packs/day: 0.50    Types: Cigarettes  . Smokeless tobacco: Never Used  . Alcohol use 0.0 oz/week     Comment: Occasional alcohol use  . Drug use: No  . Sexual activity: Yes    Birth control/ protection: Condom   Other Topics Concern  . Not on file   Social History Narrative   Lives at home alone.   Right-handed.   3 glasses of  green tea per day.       PHYSICAL EXAM   Vitals:   04/21/16 1423  BP: 115/73  Pulse: 83  Weight: 176 lb (79.8 kg)  Height: 5\' 11"  (1.803 m)    Not recorded      Body mass index is 24.55 kg/m.  PHYSICAL EXAMNIATION:  Gen: NAD, conversant, well nourised, obese, well groomed                     Cardiovascular: Regular rate rhythm, no peripheral edema, warm, nontender. Eyes: Conjunctivae clear without exudates or hemorrhage Neck: Supple, no carotid bruise. Pulmonary: Clear to auscultation bilaterally   NEUROLOGICAL EXAM:  MENTAL STATUS: Speech:    Speech is normal; fluent and spontaneous with normal comprehension.  Cognition:     Orientation to time, place and person  Normal recent and remote memory     Normal Attention span and concentration     Normal Language, naming, repeating,spontaneous speech     Fund of knowledge   CRANIAL NERVES: CN II: Visual fields are full to confrontation. Fundoscopic exam is normal with sharp discs and no vascular changes. Pupils are round equal and briskly reactive to light. CN III, IV, VI: extraocular movement are normal. No ptosis. CN V: Facial sensation is intact to pinprick in all 3 divisions bilaterally. Corneal responses are intact.  CN VII: Face is symmetric with normal eye closure and smile. CN VIII: Hearing is normal to rubbing fingers CN IX, X: Palate elevates symmetrically. Phonation is normal. CN XI: Head turning and shoulder shrug are intact CN XII: Tongue is midline with normal movements and no atrophy.  MOTOR: There is no pronator drift of out-stretched arms. Muscle bulk and tone are normal. Muscle strength is normal.  REFLEXES: Reflexes are 2+ and symmetric at the biceps, triceps, knees, and ankles. Plantar responses are flexor.  SENSORY: Intact to light touch, pinprick, position sense, and vibration sense are intact in fingers and toes.  COORDINATION: Rapid alternating movements and fine finger movements are  intact. There is no dysmetria on finger-to-nose and heel-knee-shin.    GAIT/STANCE: Posture is normal. Gait is steady with normal steps, base, arm swing, and turning. Heel and toe walking are normal. Tandem gait is normal.  Romberg is absent.  DIAGNOSTIC DATA (LABS, IMAGING, TESTING) - I reviewed patient records, labs, notes, testing and imaging myself where available.   ASSESSMENT AND PLAN  AALEIGHA MERRELL is a 31 y.o. female    Chronic migraine headaches with aura  She has tried and failed multiple different preventive medications in the past, currently is taking Topamax, previously has failed beta blocker, calcium channel blocker  She is on polypharmacy treatment latuda, and Abilify, Xanax, seroquel, escopiclone  Imitrex tablet 100 mg as needed was helpful but often she has to take multiple dose  refilled Imitrex injection which works well for her   Also add on Cardizem CR 120 mg every night   BOTOX injection was performed according to protocol by [facility].     Corrugator 2 sites, 10 units Procerus 1 site, 5 unit Frontalis 4 sites,  20 units, Temporalis 8 sites,  40 units  Occipitalis 6 sites, 30 units Cervical Paraspinal, 4 sites, 20 units Trapezius, 6 sites, 30 units  Extra 45 units was injected along bilateral masseters 15 units each, 15 units at bilateral cervical paraspinal muscles  Will return for repeat injection in 3 months.  Abnormal MRI of the brain, Depression  We will repeat MRI of the brain with and without contrast with thin cuts of pituitary gland  Marcial Pacas, M.D. Ph.D.  [facility] [address] [place], [place] [address] Ph: [phone] Fax: [phone]  CC: To Dr. [name], Dr. Nelva Bush, Delfino Lovett

## 2016-05-18 ENCOUNTER — Telehealth: Payer: Self-pay | Admitting: Neurology

## 2016-05-18 NOTE — Telephone Encounter (Signed)
Patient called [facility] on [address] to get a refill for SUMAtriptan 6 MG/0.5ML SOAJ. She was told Medicaid will not cover. Patient says she has to have this medication for her migraines. Please call and discuss.

## 2016-05-18 NOTE — Telephone Encounter (Signed)
Sumatriptan does not require a PA through [facility] - they will cover one triptan prescription per month.  Spoke to [place] at the pharmacy who says it looks as if the patient only has family planning coverage now.  If this is the case, Medicaid will only cover birth control and no other medications.  Called the patient and she feels this is incorrect.  She is going to call Medicaid to get the problem resolved.

## 2016-05-20 NOTE — Telephone Encounter (Addendum)
Checked [email or web address] - she is will be able to get sumatriptan 100mg , #9 tablets for $11.09 at [facility].  Pt aware and appreciative of information.

## 2016-05-20 NOTE — Telephone Encounter (Signed)
Patient called office in reference to Sumatriptan.  Pharmacy advised patient medication will be over $100 for 9 pills.  Please call

## 2016-07-15 ENCOUNTER — Ambulatory Visit (INDEPENDENT_AMBULATORY_CARE_PROVIDER_SITE_OTHER): Payer: Medicaid Other | Admitting: *Deleted

## 2016-07-15 ENCOUNTER — Telehealth: Payer: Self-pay | Admitting: Neurology

## 2016-07-15 DIAGNOSIS — G43611 Persistent migraine aura with cerebral infarction, intractable, with status migrainosus: Secondary | ICD-10-CM

## 2016-07-15 DIAGNOSIS — I639 Cerebral infarction, unspecified: Principal | ICD-10-CM

## 2016-07-15 MED ORDER — KETOROLAC TROMETHAMINE 60 MG/2ML IM SOLN
60.0000 mg | Freq: Once | INTRAMUSCULAR | Status: AC
  Administered 2016-07-15: 60 mg via INTRAMUSCULAR

## 2016-07-15 NOTE — Telephone Encounter (Signed)
Spoke to patient - migraine has failed to respond to home medications. Per vo by Dr. Felecia Shelling, IV of VPA 1gram, Compazine 10mg , Toradol 30mg  offered to patient.   She declined offer for IV medications and stated she would rather just have an injection of Toradol.  Ok, per vo by Dr. Felecia Shelling, to proceed with IM injection of Toradol 60mg .

## 2016-07-15 NOTE — Telephone Encounter (Signed)
Patient called office in reference to having a migraine for 4 days in a row.  She took a sumatriptan with no relief.  Patient would like to know if she can come in today to have a shot to help with the migraine. Patient is very upset/crying due to being in pain.

## 2016-07-20 ENCOUNTER — Telehealth: Payer: Self-pay | Admitting: Neurology

## 2016-07-20 NOTE — Telephone Encounter (Signed)
Patient has a Botox appointment tomorrow with Dr. Krista Blue and she wants to be sure Medicaid will cover it. Please call because if she cancels she wants to give 24 hours notice.

## 2016-07-20 NOTE — Telephone Encounter (Signed)
Returned the patients call and spoke with her regarding the injection. We discussed that she would have a copay per Debbie in billing.

## 2016-07-21 ENCOUNTER — Ambulatory Visit (INDEPENDENT_AMBULATORY_CARE_PROVIDER_SITE_OTHER): Payer: Medicaid Other | Admitting: Neurology

## 2016-07-21 ENCOUNTER — Encounter (INDEPENDENT_AMBULATORY_CARE_PROVIDER_SITE_OTHER): Payer: Self-pay

## 2016-07-21 ENCOUNTER — Encounter: Payer: Self-pay | Admitting: Neurology

## 2016-07-21 VITALS — Ht 71.0 in

## 2016-07-21 DIAGNOSIS — G43719 Chronic migraine without aura, intractable, without status migrainosus: Secondary | ICD-10-CM | POA: Diagnosis not present

## 2016-07-21 MED ORDER — KETOROLAC TROMETHAMINE 10 MG PO TABS: 10.0000 mg | ORAL_TABLET | Freq: Four times a day (QID) | ORAL | 6 refills | Status: DC | PRN

## 2016-07-21 NOTE — Progress Notes (Signed)
**  Botox 100 units x 2 vials, NDC 8343-7357-89, Lot [id number], Exp 12/2018, specialty pharmacy.//mck,rn**

## 2016-07-21 NOTE — Progress Notes (Signed)
Chief Complaint  Patient presents with  . Migraine    Botox 200 units - specialty pharmacy      PATIENT: Carmen Daniels DOB: [birth year]  Chief Complaint  Patient presents with  . Migraine    Botox 200 units - specialty pharmacy     HISTORICAL  Carmen Daniels is a 31 years old right-handed female, accompanied by her mother,, seen in refer by her pain management doctor Dr. Nelva Bush for evaluation of migraines  I have reviewed most recent office note in April 2016, she has past medical history of bipolar disorder, complains of frequent headaches, diffuse body achy pain UDS was negative,   She complains of migraine headaches since 2011, her typical migraine started with tunnel vision, lasting for 10-15 minutes, followed by severe pounding headache with associated light noise sensitivity, sumatriptan 100 mg as needed has been very helpful, but sometimes she require repeat dosage. She is now having 4-5 typical prolonged migraines each week, lasting for 1 day,  Trigger for her migraines are stress, sleep deprivation,  She has tried over-the-counter aspirin, Tylenol, NSAIDs Excedrin Migraine, without helping her headaches, she has seen different neurologists in the past, has tried different preventive medications, including beta blocker, calcium channel blocker without helping, currently she is taking Topamax 25 mg 7 to 8 tablets every night, which is adjusted by her psychologist Dr. 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 No white matter disease or large vessel/lacunar infarction.  UPDATE Mar 27 2015: She is now taking Topamax 25 mg tablets 8 tablets every night, still has frequent migraine headaches, also consider Botox injection as migraine prevention,  she has variable frequency of migraine, few weeks ago, she had 3 weeks of daily headaches, Imitrex as needed is helpful in 60 minutes, she still has dull achy pain, but no longer has the intense headaches.  used up all 9 tablets of allowed Imitrex each month. She usually has visual aura proceeding each migraine headaches,  She is on polypharmacy for her mood disorder, currently taking Topamax, latuda, seroquel, Xanax, Abilify  UPDATE September 04 2015: She had her first Botox injection as migraine prevention in May 05 2015, she had frequent headache postinjection for 2 weeks, eventually she noticed a benefit, she has much less, less severe migraine, but even with the injection she has 2-3 migraines each week, noticed wearing off at the end of 3 months.  UPDATE Nov 28th 2017: She is having frequent headaches, couple times a week, reported 50% improvement with Botox injection, has much less frequent and less severe headaches. 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   UPDATE Jul 21 2016: She responded well to previous injection on April 21 2016, she did not notice significant side effect, she reported less headache, headache  has also become less intense,  She also tried Toradol IM injection for one prolonged headaches, which helps well for her, she used to her monthly supply of Imitrex tablets,  REVIEW OF SYSTEMS: Full 14 system review of systems performed and  notable only for as above  ALLERGIES: Allergies  Allergen Reactions  . Keflex [Cephalexin] Anaphylaxis  . Flagyl [Metronidazole]     HOME MEDICATIONS: Current Outpatient Prescriptions  Medication Sig Dispense Refill  . ALPRAZolam (XANAX) 1 MG tablet Take 1 mg by mouth 4 (four) times daily.  2  . ARIPiprazole (ABILIFY) 30 MG tablet Take 30 mg by mouth daily.  3  . Eszopiclone 3 MG TABS Take 3 mg by mouth at bedtime.  3  . HYDROcodone-acetaminophen (NORCO) 10-325 MG per tablet Take 1 tablet by mouth 2 (two) times daily as needed. for pain  0  . LATUDA 40 MG TABS tablet TAKE 1 TABLET EVERY EVENING WITH FOOD  1  . promethazine (PHENERGAN) 25 MG tablet Take 25 mg by mouth 3 (three) times daily as needed.  0  . QUEtiapine (SEROQUEL) 25 MG tablet Take 25 mg by mouth at bedtime.  2  . SUMAtriptan (IMITREX) 100 MG tablet TAKE 1 TABLET BY MOUTH. MAY TAKE REPEAT WITH 1 TABLET IN TWO HOURS IF NEEDED  5  . topiramate (TOPAMAX) 25 MG tablet TAKE 7-8 TABLETS AT BEDTIME  0     PAST MEDICAL HISTORY: Past Medical History:  Diagnosis Date  . Allergic rhinitis   . Anxiety   . Asthma   . Back pain   . Bipolar disorder ([place])   . Chronic pain   . Migraines   . Nasal polyps   . Nausea   . Neck pain   . OCD (obsessive compulsive disorder)     PAST SURGICAL HISTORY: Past Surgical History:  Procedure Laterality Date  . NASAL SINUS SURGERY    . WISDOM TOOTH EXTRACTION      FAMILY HISTORY: Family History  Problem Relation Age of Onset  . Healthy Father   . Healthy Mother   . Arthritis Maternal Grandmother   . Arthritis Maternal Grandfather   . Depression Paternal Grandmother     SOCIAL HISTORY:  Social History   Social History  . Marital status: Married    Spouse name: N/A  . Number of children: 0  . Years of education: 14   Occupational History  . Unemployed    Social History Main Topics  . Smoking status: Current Every Day Smoker    Packs/day: 0.50    Types: Cigarettes    . Smokeless tobacco: Never Used  . Alcohol use 0.0 oz/week     Comment: Occasional alcohol use  . Drug use: No  . Sexual activity: Yes    Birth control/ protection: Condom   Other Topics Concern  . Not on file   Social History Narrative   Lives at home alone.   Right-handed.   3 glasses of green tea per day.       PHYSICAL EXAM   Vitals:   07/21/16 1434  Height: 5\' 11"  (1.803 m)    Not recorded      There is no height or weight on file to calculate BMI.  PHYSICAL EXAMNIATION:  Gen: NAD, conversant, well nourised, obese, well groomed                     Cardiovascular: Regular rate rhythm, no peripheral edema, warm, nontender. Eyes: Conjunctivae clear without  exudates or hemorrhage Neck: Supple, no carotid bruise. Pulmonary: Clear to auscultation bilaterally   NEUROLOGICAL EXAM:  MENTAL STATUS: Speech:    Speech is normal; fluent and spontaneous with normal comprehension.  Cognition:     Orientation to time, place and person     Normal recent and remote memory     Normal Attention span and concentration     Normal Language, naming, repeating,spontaneous speech     Fund of knowledge   CRANIAL NERVES: CN II: Visual fields are full to confrontation. Fundoscopic exam is normal with sharp discs and no vascular changes. Pupils are round equal and briskly reactive to light. CN III, IV, VI: extraocular movement are normal. No ptosis. CN V: Facial sensation is intact to pinprick in all 3 divisions bilaterally. Corneal responses are intact.  CN VII: Face is symmetric with normal eye closure and smile. CN VIII: Hearing is normal to rubbing fingers CN IX, X: Palate elevates symmetrically. Phonation is normal. CN XI: Head turning and shoulder shrug are intact CN XII: Tongue is midline with normal movements and no atrophy.  MOTOR: There is no pronator drift of out-stretched arms. Muscle bulk and tone are normal. Muscle strength is normal.  REFLEXES: Reflexes are 2+  and symmetric at the biceps, triceps, knees, and ankles. Plantar responses are flexor.  SENSORY: Intact to light touch, pinprick, position sense, and vibration sense are intact in fingers and toes.  COORDINATION: Rapid alternating movements and fine finger movements are intact. There is no dysmetria on finger-to-nose and heel-knee-shin.    GAIT/STANCE: Posture is normal. Gait is steady with normal steps, base, arm swing, and turning. Heel and toe walking are normal. Tandem gait is normal.  Romberg is absent.  DIAGNOSTIC DATA (LABS, IMAGING, TESTING) - I reviewed patient records, labs, notes, testing and imaging myself where available.   ASSESSMENT AND PLAN  Carmen Daniels is a 31 y.o. female    Chronic migraine headaches with aura  She has tried and failed multiple different preventive medications in the past, currently is taking Topamax, previously has failed beta blocker, calcium channel blocker  She is on polypharmacy treatment latuda, and Abilify, Xanax, seroquel, escopiclone  Imitrex tablet 100 mg as needed was helpful but often she has to take multiple dose  refilled Imitrex injection which works well for her   Also add on Cardizem CR 120 mg every night   BOTOX injection was performed according to protocol by [facility].     Corrugator 2 sites, 10 units Procerus 1 site, 5 unit Frontalis 4 sites,  20 units, Temporalis 8 sites,  40 units  Occipitalis 6 sites, 30 units Cervical Paraspinal, 4 sites, 20 units Trapezius, 6 sites, 30 units  Extra 45 units was injected along bilateral masseters 15 units each, 15 units at bilateral cervical paraspinal muscles  Will return for repeat injection in 3 months for repeat injection.   Carmen Daniels, M.D. Ph.D.  [facility] [address] [place], [place] [address] Ph: [phone] Fax: [phone]  CC: To Dr. [name], Dr. Nelva Bush, Delfino Lovett

## 2016-07-23 ENCOUNTER — Telehealth: Payer: Self-pay | Admitting: *Deleted

## 2016-07-23 NOTE — Telephone Encounter (Signed)
Called and spoke with Butch Penny from [place]. Initiated PA sumatriptan inj. PA sent to pharmacy for review. KM#[id number]. Interaction ID number for call: [id number] Awaiting response.

## 2016-08-20 ENCOUNTER — Other Ambulatory Visit: Payer: Self-pay | Admitting: Pediatrics

## 2016-08-27 ENCOUNTER — Other Ambulatory Visit: Payer: Self-pay | Admitting: Pediatrics

## 2016-10-03 ENCOUNTER — Emergency Department (HOSPITAL_COMMUNITY): Payer: Medicaid Other

## 2016-10-03 ENCOUNTER — Emergency Department (HOSPITAL_COMMUNITY)
Admission: EM | Admit: 2016-10-03 | Discharge: 2016-10-03 | Disposition: A | Discharge status: Home / Self Care | Payer: Medicaid Other | Attending: Emergency Medicine | Admitting: Emergency Medicine

## 2016-10-03 ENCOUNTER — Encounter (HOSPITAL_COMMUNITY): Payer: Self-pay

## 2016-10-03 DIAGNOSIS — X58XXXA Exposure to other specified factors, initial encounter: Secondary | ICD-10-CM | POA: Insufficient documentation

## 2016-10-03 DIAGNOSIS — S060X9A Concussion with loss of consciousness of unspecified duration, initial encounter: Secondary | ICD-10-CM | POA: Diagnosis not present

## 2016-10-03 DIAGNOSIS — Y929 Unspecified place or not applicable: Secondary | ICD-10-CM | POA: Diagnosis not present

## 2016-10-03 DIAGNOSIS — Y999 Unspecified external cause status: Secondary | ICD-10-CM | POA: Diagnosis not present

## 2016-10-03 DIAGNOSIS — J45909 Unspecified asthma, uncomplicated: Secondary | ICD-10-CM | POA: Diagnosis not present

## 2016-10-03 DIAGNOSIS — Y9389 Activity, other specified: Secondary | ICD-10-CM | POA: Diagnosis not present

## 2016-10-03 DIAGNOSIS — Z79899 Other long term (current) drug therapy: Secondary | ICD-10-CM | POA: Diagnosis not present

## 2016-10-03 DIAGNOSIS — Z7983 Long term (current) use of bisphosphonates: Secondary | ICD-10-CM | POA: Diagnosis not present

## 2016-10-03 DIAGNOSIS — F17210 Nicotine dependence, cigarettes, uncomplicated: Secondary | ICD-10-CM | POA: Diagnosis not present

## 2016-10-03 DIAGNOSIS — S6010XA Contusion of unspecified finger with damage to nail, initial encounter: Secondary | ICD-10-CM

## 2016-10-03 DIAGNOSIS — W19XXXA Unspecified fall, initial encounter: Secondary | ICD-10-CM

## 2016-10-03 DIAGNOSIS — S0083XA Contusion of other part of head, initial encounter: Secondary | ICD-10-CM

## 2016-10-03 DIAGNOSIS — S99922A Unspecified injury of left foot, initial encounter: Secondary | ICD-10-CM | POA: Insufficient documentation

## 2016-10-03 DIAGNOSIS — S0990XA Unspecified injury of head, initial encounter: Secondary | ICD-10-CM | POA: Diagnosis present

## 2016-10-03 MED ORDER — ONDANSETRON 4 MG PO TBDP
4.0000 mg | ORAL_TABLET | Freq: Once | ORAL | Status: AC
  Administered 2016-10-03: 4 mg via ORAL
  Filled 2016-10-03: qty 1

## 2016-10-03 MED ORDER — NAPROXEN 500 MG PO TABS: 500.0000 mg | ORAL_TABLET | Freq: Two times a day (BID) | ORAL | 0 refills | Status: DC

## 2016-10-03 MED ORDER — OXYCODONE-ACETAMINOPHEN 5-325 MG PO TABS
1.0000 | ORAL_TABLET | Freq: Once | ORAL | Status: AC
  Administered 2016-10-03: 1 via ORAL
  Filled 2016-10-03: qty 1

## 2016-10-03 MED ORDER — KETOROLAC TROMETHAMINE 30 MG/ML IJ SOLN
30.0000 mg | Freq: Once | INTRAMUSCULAR | Status: AC
  Administered 2016-10-03: 30 mg via INTRAVENOUS
  Filled 2016-10-03: qty 1

## 2016-10-03 MED ORDER — ONDANSETRON 4 MG PO TBDP: 4.0000 mg | ORAL_TABLET | Freq: Three times a day (TID) | ORAL | 0 refills | Status: DC | PRN

## 2016-10-03 NOTE — ED Provider Notes (Signed)
[facility] DEPT Provider Note   CSN: [id number] Arrival date & time: 10/03/16  5361     History   Chief Complaint Chief Complaint  Patient presents with  . Fall    HPI Carmen Daniels is a 31 y.o. female.  HPI  This is a 31 year old female who presents following a fall. Patient reports slipping and falling forward feeding her chickens at 1 AM. She reports hitting her face. She is unsure about loss of consciousness. She reports 3 episodes of nausea and vomiting, nonbilious, nonbloody. She also reports blurry vision. She reports right-sided facial pain and left great toe pain. She has been ambulatory. She reports that she took a Vicodin at home with minimal relief. Currently her pain is 10 out of 10. She does not take blood thinners.    Past Medical History:  Diagnosis Date  . Allergic rhinitis   . Anxiety   . Asthma   . Back pain   . Bipolar disorder ([place])   . Chronic pain   . Migraines   . Nasal polyps   . Nausea   . Neck pain   . OCD (obsessive compulsive disorder)     Patient Active Problem List   Diagnosis Date Noted  . Abnormal MRI of head 04/21/2016  . Chronic migraine without aura 03/27/2015  . Mild persistent asthma 12/30/2014  . Allergic rhinitis due to pollen 12/30/2014  . 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 Nasal polyposis 12/30/2014    Past Surgical History:  Procedure Laterality Date  . NASAL SINUS SURGERY    . WISDOM TOOTH EXTRACTION      OB History    No data available       Home Medications    Prior to Admission medications   Medication Sig Start Date End Date Taking? Authorizing Provider  ALPRAZolam Duanne Moron) 1 MG tablet Take 1 mg by mouth 4 (four) times daily. 09/16/14   [provider]  Azelastine-Fluticasone (DYMISTA) 137-50 MCG/ACT SUSP Place 1 spray into both nostrils 2 (two) times daily.    [provider]  beclomethasone (QVAR) 40 MCG/ACT inhaler Inhale 1 puff into the lungs 2 (two) times daily.    [provider]  HYDROcodone-acetaminophen (NORCO) 10-325 MG per tablet Take 1 tablet by mouth 2 (two) times daily as needed. for pain 09/16/14   [provider]  ketorolac (TORADOL) 10 MG tablet Take 1 tablet (10 mg total) by mouth every 6 (six) hours as needed. 07/21/16   Marcial Pacas, MD  lurasidone (LATUDA) 40 MG TABS tablet Take 40 mg by mouth every evening.    [provider]  naproxen (NAPROSYN) 500 MG tablet Take 1 tablet (500 mg total) by mouth 2 (two) times daily. 10/03/16   Horton, Barbette Hair, MD  ondansetron (ZOFRAN ODT) 4 MG disintegrating tablet Take 1 tablet (4 mg total) by mouth every 8 (eight) hours as needed for nausea or vomiting. 10/03/16   Horton, Barbette Hair, MD  promethazine (PHENERGAN) 25 MG tablet Take 25 mg by mouth 3 (three) times daily as needed. 09/16/14   [provider]  QUEtiapine (SEROQUEL) 25 MG tablet Take 25 mg by mouth at bedtime. 09/12/14   [provider]  SUMAtriptan (IMITREX) 100 MG tablet TAKE 1 TABLET (100 MG TOTAL) BY MOUTH ONCE. MAY REPEAT IN 2 HOURS IF HEADACHE PERSISTS OR RECURS. 03/30/16 03/30/16  Marcial Pacas, MD  SUMAtriptan 6 MG/0.5ML SOAJ Inject 6mg  at onset of migraine. May repeat in 2 hours, if needed.  Max  of 2 injections in 24 hours. 01/20/16   Marcial Pacas, MD  VENTOLIN HFA 108 (90 BASE) MCG/ACT inhaler INHALE 2 PUFFS EVERY 4-6HRS AS NEEDED FOR COUGH/WHEEZE MAY USE 2PUFFS 10-20MINS PRIOR TO EXERCISE 01/09/15   Charlies Silvers, MD    Family History Family History  Problem Relation Age of Onset  . Healthy Father   . Healthy Mother   . Arthritis Maternal Grandmother   . Arthritis Maternal Grandfather   . Depression Paternal Grandmother     Social History Social History  Substance Use Topics  . Smoking status: Current Every Day Smoker    Packs/day: 0.50    Types: Cigarettes  . Smokeless tobacco: Never Used  . Alcohol use 0.0 oz/week     Comment: Occasional alcohol use     Allergies   Keflex [cephalexin] and  Flagyl [metronidazole]   Review of Systems Review of Systems  Constitutional: Negative for fever.  HENT: Positive for facial swelling.   Eyes: Positive for visual disturbance.  Respiratory: Negative for shortness of breath.   Cardiovascular: Negative for chest pain.  Gastrointestinal: Positive for nausea and vomiting. Negative for abdominal pain.  Musculoskeletal: Negative for neck pain.  Skin: Positive for wound.  Neurological: Positive for headaches. Negative for dizziness and seizures.  All other systems reviewed and are negative.    Physical Exam Updated Vital Signs BP 134/78 (BP Location: Right Arm)   Pulse 77   Temp 98.5 F (36.9 C) (Oral)   Resp 16   Ht 5\' 10"  (1.778 m)   Wt 68 kg (150 lb)   LMP 09/28/2016   SpO2 100%   BMI 21.52 kg/m   Physical Exam  Constitutional: She is oriented to person, place, and time. She appears well-developed and well-nourished. No distress.  HENT:  Head: Normocephalic and atraumatic.  Multiple facial piercings, no obvious midface instability, dentition appears intact, tenderness to palpation of the right lower mandible  Eyes: Pupils are equal, round, and reactive to light.  Neck: Normal range of motion. Neck supple.  Cardiovascular: Normal rate and regular rhythm.   Pulmonary/Chest: Effort normal. No respiratory distress.  Abdominal: Soft. There is no tenderness.  Musculoskeletal: Normal range of motion. She exhibits no edema or deformity.  Normal range of motion bilateral hips and knees, tenderness palpation of the lateral malleolus, subungual hematoma left great toe  Neurological: She is alert and oriented to person, place, and time.  Cranial nerves II through XII intact, 5 out of 5 strength in all 4 extremities  Skin: Skin is warm and dry.  Psychiatric: She has a normal mood and affect.  Nursing note and vitals reviewed.    ED Treatments / Results  Labs (all labs ordered are listed, but only abnormal results are  displayed) Labs Reviewed - No data to display  EKG  EKG Interpretation None       Radiology Dg Ankle Complete Left  Result Date: 10/03/2016 CLINICAL DATA:  31 y/o  F; fall with pain. EXAM: LEFT ANKLE COMPLETE - 3+ VIEW COMPARISON:  None. FINDINGS: No acute fracture or dislocation identified. Talar dome is intact. Ankle mortise is symmetric on these nonstress views. IMPRESSION: No acute fracture or dislocation identified. Electronically Signed   By: Kristine Garbe M.D.   On: 10/03/2016 05:48   Ct Head Wo Contrast  Result Date: 10/03/2016 CLINICAL DATA:  Status post fall. Hit face. Posterior neck pain and right cheek and jaw pain. Frontal headache. Initial encounter. EXAM: CT HEAD WITHOUT CONTRAST CT MAXILLOFACIAL WITHOUT  CONTRAST CT CERVICAL SPINE WITHOUT CONTRAST TECHNIQUE: Multidetector CT imaging of the head, cervical spine, and maxillofacial structures were performed using the standard protocol without intravenous contrast. Multiplanar CT image reconstructions of the cervical spine and maxillofacial structures were also generated. COMPARISON:  MRI of the brain performed 04/12/2013 FINDINGS: CT HEAD FINDINGS Brain: No evidence of acute infarction, hemorrhage, hydrocephalus, extra-axial collection or mass lesion/mass effect. The posterior fossa, including the cerebellum, brainstem and fourth ventricle, is within normal limits. The third and lateral ventricles, and basal ganglia are unremarkable in appearance. The cerebral hemispheres are symmetric in appearance, with normal gray-white differentiation. No mass effect or midline shift is seen. Vascular: No hyperdense vessel or unexpected calcification. Skull: There is no evidence of fracture; visualized osseous structures are unremarkable in appearance. Other: No significant soft tissue abnormalities are seen. CT MAXILLOFACIAL FINDINGS Osseous: There is no evidence of fracture or dislocation. The maxilla and mandible appear intact. The  nasal bone is unremarkable in appearance. The visualized dentition demonstrates no acute abnormality. Orbits: The orbits are intact bilaterally. Sinuses: Mucus retention cysts or polyps are noted at the maxillary sinuses bilaterally. The remaining visualized paranasal sinuses and mastoid air cells are well-aerated. Soft tissues: No significant soft tissue abnormalities are seen. Multiple metallic piercings are noted. The parapharyngeal fat planes are preserved. The nasopharynx, oropharynx and hypopharynx are unremarkable in appearance. The visualized portions of the valleculae and piriform sinuses are grossly unremarkable. The parotid and submandibular glands are within normal limits. No cervical lymphadenopathy is seen. CT CERVICAL SPINE FINDINGS Alignment: There is slight reversal of the normal lordotic curvature of the cervical spine. Skull base and vertebrae: No acute fracture. No primary bone lesion or focal pathologic process. Soft tissues and spinal canal: No prevertebral fluid or swelling. No visible canal hematoma. Disc levels: Intervertebral disc spaces are grossly preserved. Small anterior and posterior disc osteophyte complexes are noted at C3-C4. Upper chest: The visualized lung apices are clear. The thyroid gland is unremarkable. Other: No additional soft tissue abnormalities are seen. IMPRESSION: 1. No evidence of traumatic intracranial injury or fracture. 2. No evidence of fracture or dislocation with regard to the maxillofacial structures. 3. No evidence of fracture or subluxation along the cervical spine. 4. Mucus retention cysts or polyps at the maxillary sinuses bilaterally. Electronically Signed   By: Garald Balding M.D.   On: 10/03/2016 05:51   Ct Cervical Spine Wo Contrast  Result Date: 10/03/2016 CLINICAL DATA:  Status post fall. Hit face. Posterior neck pain and right cheek and jaw pain. Frontal headache. Initial encounter. EXAM: CT HEAD WITHOUT CONTRAST CT MAXILLOFACIAL WITHOUT CONTRAST  CT CERVICAL SPINE WITHOUT CONTRAST TECHNIQUE: Multidetector CT imaging of the head, cervical spine, and maxillofacial structures were performed using the standard protocol without intravenous contrast. Multiplanar CT image reconstructions of the cervical spine and maxillofacial structures were also generated. COMPARISON:  MRI of the brain performed 04/12/2013 FINDINGS: CT HEAD FINDINGS Brain: No evidence of acute infarction, hemorrhage, hydrocephalus, extra-axial collection or mass lesion/mass effect. The posterior fossa, including the cerebellum, brainstem and fourth ventricle, is within normal limits. The third and lateral ventricles, and basal ganglia are unremarkable in appearance. The cerebral hemispheres are symmetric in appearance, with normal gray-white differentiation. No mass effect or midline shift is seen. Vascular: No hyperdense vessel or unexpected calcification. Skull: There is no evidence of fracture; visualized osseous structures are unremarkable in appearance. Other: No significant soft tissue abnormalities are seen. CT MAXILLOFACIAL FINDINGS Osseous: There is no evidence of fracture or dislocation. The  maxilla and mandible appear intact. The nasal bone is unremarkable in appearance. The visualized dentition demonstrates no acute abnormality. Orbits: The orbits are intact bilaterally. Sinuses: Mucus retention cysts or polyps are noted at the maxillary sinuses bilaterally. The remaining visualized paranasal sinuses and mastoid air cells are well-aerated. Soft tissues: No significant soft tissue abnormalities are seen. Multiple metallic piercings are noted. The parapharyngeal fat planes are preserved. The nasopharynx, oropharynx and hypopharynx are unremarkable in appearance. The visualized portions of the valleculae and piriform sinuses are grossly unremarkable. The parotid and submandibular glands are within normal limits. No cervical lymphadenopathy is seen. CT CERVICAL SPINE FINDINGS Alignment:  There is slight reversal of the normal lordotic curvature of the cervical spine. Skull base and vertebrae: No acute fracture. No primary bone lesion or focal pathologic process. Soft tissues and spinal canal: No prevertebral fluid or swelling. No visible canal hematoma. Disc levels: Intervertebral disc spaces are grossly preserved. Small anterior and posterior disc osteophyte complexes are noted at C3-C4. Upper chest: The visualized lung apices are clear. The thyroid gland is unremarkable. Other: No additional soft tissue abnormalities are seen. IMPRESSION: 1. No evidence of traumatic intracranial injury or fracture. 2. No evidence of fracture or dislocation with regard to the maxillofacial structures. 3. No evidence of fracture or subluxation along the cervical spine. 4. Mucus retention cysts or polyps at the maxillary sinuses bilaterally. Electronically Signed   By: Garald Balding M.D.   On: 10/03/2016 05:51   Dg Toe Great Left  Result Date: 10/03/2016 CLINICAL DATA:  31 y/o  F; fall with left big toe pain. EXAM: LEFT GREAT TOE COMPARISON:  None. FINDINGS: There is no evidence of fracture or dislocation. There is no evidence of arthropathy or other focal bone abnormality. Soft tissues are unremarkable. IMPRESSION: Negative. Electronically Signed   By: Kristine Garbe M.D.   On: 10/03/2016 05:46   Ct Maxillofacial Wo Contrast  Result Date: 10/03/2016 CLINICAL DATA:  Status post fall. Hit face. Posterior neck pain and right cheek and jaw pain. Frontal headache. Initial encounter. EXAM: CT HEAD WITHOUT CONTRAST CT MAXILLOFACIAL WITHOUT CONTRAST CT CERVICAL SPINE WITHOUT CONTRAST TECHNIQUE: Multidetector CT imaging of the head, cervical spine, and maxillofacial structures were performed using the standard protocol without intravenous contrast. Multiplanar CT image reconstructions of the cervical spine and maxillofacial structures were also generated. COMPARISON:  MRI of the brain performed 04/12/2013  FINDINGS: CT HEAD FINDINGS Brain: No evidence of acute infarction, hemorrhage, hydrocephalus, extra-axial collection or mass lesion/mass effect. The posterior fossa, including the cerebellum, brainstem and fourth ventricle, is within normal limits. The third and lateral ventricles, and basal ganglia are unremarkable in appearance. The cerebral hemispheres are symmetric in appearance, with normal gray-white differentiation. No mass effect or midline shift is seen. Vascular: No hyperdense vessel or unexpected calcification. Skull: There is no evidence of fracture; visualized osseous structures are unremarkable in appearance. Other: No significant soft tissue abnormalities are seen. CT MAXILLOFACIAL FINDINGS Osseous: There is no evidence of fracture or dislocation. The maxilla and mandible appear intact. The nasal bone is unremarkable in appearance. The visualized dentition demonstrates no acute abnormality. Orbits: The orbits are intact bilaterally. Sinuses: Mucus retention cysts or polyps are noted at the maxillary sinuses bilaterally. The remaining visualized paranasal sinuses and mastoid air cells are well-aerated. Soft tissues: No significant soft tissue abnormalities are seen. Multiple metallic piercings are noted. The parapharyngeal fat planes are preserved. The nasopharynx, oropharynx and hypopharynx are unremarkable in appearance. The visualized portions of the valleculae and  piriform sinuses are grossly unremarkable. The parotid and submandibular glands are within normal limits. No cervical lymphadenopathy is seen. CT CERVICAL SPINE FINDINGS Alignment: There is slight reversal of the normal lordotic curvature of the cervical spine. Skull base and vertebrae: No acute fracture. No primary bone lesion or focal pathologic process. Soft tissues and spinal canal: No prevertebral fluid or swelling. No visible canal hematoma. Disc levels: Intervertebral disc spaces are grossly preserved. Small anterior and posterior  disc osteophyte complexes are noted at C3-C4. Upper chest: The visualized lung apices are clear. The thyroid gland is unremarkable. Other: No additional soft tissue abnormalities are seen. IMPRESSION: 1. No evidence of traumatic intracranial injury or fracture. 2. No evidence of fracture or dislocation with regard to the maxillofacial structures. 3. No evidence of fracture or subluxation along the cervical spine. 4. Mucus retention cysts or polyps at the maxillary sinuses bilaterally. Electronically Signed   By: Garald Balding M.D.   On: 10/03/2016 05:51    Procedures Procedures (including critical care time)  Medications Ordered in ED Medications  ondansetron (ZOFRAN-ODT) disintegrating tablet 4 mg (4 mg Oral Given 10/03/16 0620)  oxyCODONE-acetaminophen (PERCOCET/ROXICET) 5-325 MG per tablet 1 tablet (1 tablet Oral Given 10/03/16 0620)  ketorolac (TORADOL) 30 MG/ML injection 30 mg (30 mg Intravenous Given 10/03/16 0710)     Initial Impression / Assessment and Plan / ED Course  I have reviewed the triage vital signs and the nursing notes.  Pertinent labs & imaging results that were available during my care of the patient were reviewed by me and considered in my medical decision making (see chart for details).     Patient presents following a fall. She is nontoxic on exam. ABCs intact. She reports multiple episodes of nausea and vomiting. While no obvious traumatic injury to the head, she is high risk given [place] CT head rules. CT head, neck, max face ordered. Additionally x-rays of the left ankle and left great toe. Patient was given pain medication. Patient adamantly refused to remove facial piercings for imaging. I discussed with her that this would decrease the accuracy of imaging. Patient stated understanding but continues to refuse. Imaging is largely unremarkable. Feel patient is likely concussed. Patient is requesting pain medication as Vicodin did not help her home. I have reviewed in  [place] narcotic database. She has active prescriptions for hydrocodone, tramadol, and Xanax which she receives monthly. I discussed with the patient that she could add anti-inflammatories to include naproxen for pain. I have also offered to drain her subungual hematoma. However, patient states that she is deathly afraid of needles and has refused.  After history, exam, and medical workup I feel the patient has been appropriately medically screened and is safe for discharge home. Pertinent diagnoses were discussed with the patient. Patient was given return precautions.   Final Clinical Impressions(s) / ED Diagnoses   Final diagnoses:  Fall  Fall, initial encounter  Concussion with loss of consciousness, initial encounter  Contusion of face, initial encounter  Subungual hematoma of digit of hand, initial encounter    New Prescriptions New Prescriptions   NAPROXEN (NAPROSYN) 500 MG TABLET    Take 1 tablet (500 mg total) by mouth 2 (two) times daily.   ONDANSETRON (ZOFRAN ODT) 4 MG DISINTEGRATING TABLET    Take 1 tablet (4 mg total) by mouth every 8 (eight) hours as needed for nausea or vomiting.     Merryl Hacker, MD 10/03/16 [id number]

## 2016-10-03 NOTE — ED Triage Notes (Addendum)
Stated fell while taking care of chickens about 0100 am this morning and fell and hit face states pain and left great toe pain and vomited. Clear speech noted moves all extremities alert and oriented x 3.

## 2016-10-03 NOTE — ED Notes (Signed)
Pt stated she fell while cleaning a chicken coop wearing sandals, and c/o pain in her head (unable to remember where she hit her head), left big toe pain, and lip pain. Pt reports n/v x3 and blurred vision. Drowsy in triage.

## 2016-10-03 NOTE — Discharge Instructions (Signed)
You were seen today following a fall. You likely have suffered a concussion. Your imaging is reassuring. You need to make sure to rest and avoid stimulus. At at naproxen to pain regimen. Follow-up with her primary physician for recheck. No contact sports.  You have a subungual hematoma of your toe.  You elected not to have a strain. You can soak in warm water. If you develop any new or worsening symptoms she should be reevaluated.

## 2016-10-05 ENCOUNTER — Ambulatory Visit (INDEPENDENT_AMBULATORY_CARE_PROVIDER_SITE_OTHER): Payer: Medicaid Other | Admitting: Neurology

## 2016-10-05 ENCOUNTER — Encounter: Payer: Self-pay | Admitting: Neurology

## 2016-10-05 VITALS — BP 134/83 | HR 80 | Ht 70.0 in | Wt 174.0 lb

## 2016-10-05 DIAGNOSIS — G44329 Chronic post-traumatic headache, not intractable: Secondary | ICD-10-CM | POA: Diagnosis not present

## 2016-10-05 MED ORDER — MELOXICAM 15 MG PO TABS: 15.0000 mg | ORAL_TABLET | Freq: Every day | ORAL | 6 refills | Status: DC | PRN

## 2016-10-05 MED ORDER — KETOROLAC TROMETHAMINE 60 MG/2ML IM SOLN: 60.0000 mg | Freq: Once | INTRAMUSCULAR | Status: AC

## 2016-10-05 NOTE — Progress Notes (Addendum)
Chief Complaint  Patient presents with  . Post-concussion    From ED notes on 10/03/16: Patient reports slipping and falling forward while feeding her chickens at 1 AM. She reports hitting her face. She is unsure about loss of consciousness. She reports 3 episodes of nausea and vomiting.  She also reports blurry vision.   Marland Kitchen Post-concussion    MMSE 26/30 - animals. She has continued to have headaches, confusion, blurred vision, nausea, vomiting, dizziness, and light/noise sensitivity         PATIENT: Carmen Daniels DOB: January 27, 1986  Chief Complaint  Patient presents with  . Post-concussion    From ED notes on 10/03/16: Patient reports slipping and falling forward while feeding her chickens at 1 AM. She reports hitting her face. She is unsure about loss of consciousness. She reports 3 episodes of nausea and vomiting.  She also reports blurry vision.   Marland Kitchen Post-concussion    MMSE 26/30 - animals. She has continued to have headaches, confusion, blurred vision, nausea, vomiting, dizziness, and light/noise sensitivity        HISTORICAL  Carmen Daniels is a 31 years old right-handed female, accompanied by her mother,, seen in refer by her pain management doctor Dr. Nelva Bush for evaluation of migraines  I have reviewed most recent office note in April 2016, she has past medical history of bipolar disorder, complains of frequent headaches, diffuse body achy pain, UDS was negative,   She complains of migraine headaches since 2011, her typical migraine started with tunnel vision, lasting for 10-15 minutes, followed by severe pounding headache with associated light noise sensitivity, sumatriptan 100 mg as needed has been very helpful, but sometimes she require repeat dosage. She is now having 4-5 typical prolonged migraines each week, lasting for 1 day,  Trigger for her migraines are stress, sleep deprivation,  She has tried over-the-counter aspirin, Tylenol, NSAIDs Excedrin Migraine, without helping her  headaches, she has seen different neurologists in the past, has tried different preventive medications, including beta blocker, calcium channel blocker without helping, currently she is taking Topamax 25 mg 7 to 8 tablets every night, which is adjusted by her psychologist Dr. 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 No white matter disease or large vessel/lacunar infarction.  UPDATE Mar 27 2015: She is now taking Topamax 25 mg tablets 8 tablets every night, still has frequent migraine headaches, also consider Botox injection as migraine prevention, she has variable frequency of migraine, few weeks ago, she had 3 weeks of daily headaches, Imitrex as needed is helpful in 60 minutes, she still has dull achy pain, but no longer has the intense headaches.  used up all 9 tablets of allowed Imitrex each month. She usually has visual aura proceeding each migraine headaches,  She is on polypharmacy for her mood disorder, currently taking Topamax, latuda, seroquel, Xanax, Abilify  UPDATE September 04 2015: She had her first Botox injection as migraine prevention in May 05 2015, she had frequent headache postinjection for 2 weeks, eventually she noticed a benefit, she has much less, less severe migraine, but even with the injection she has 2-3 migraines each week, noticed wearing off at the end of 3 months.  UPDATE Nov 28th 2017: She is having frequent headaches, couple times a week, reported 50% improvement with Botox injection,  has much less frequent and less severe headaches. 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   UPDATE Jul 21 2016: She responded well to previous injection on April 21 2016, she did not notice significant side effect, she reported less headache, headache has also become less intense,  She also tried Toradol IM injection for one prolonged headaches, which helps well for her, she used to her monthly supply of Imitrex tablets,  UPDATE October 05 2016: I reviewed her [place] controlled substance registry, in July 2018, she got tramadol 60 tablets, hydrocodone 10/Tylenol 60 tablets, Xanax 1 mg 120 tablets, tramadol again 60 tablets,   In June 2018, hydrocodone/Tylenol 10/325 mg 60 tablets, Xanax 1 mg 120 tablets, tramadol 30 tablet hydrocodone syrup 112, tramadol 30 tablets  I also reviewed her ED presentation of August 12th 2018, she slipped and fell forward face down, mild transient loss of consciousness, she had persistent headache, neck pain,nausea vomiting, blurry vision since the injury,  She also hurt her left great toe, with a black and  bruise, complains of pain,  I personally reviewed CT head without contrast August twelfth 2018, which showed no significant abnormality, CT cervical and facial/sinus showed no evidence of fracture  REVIEW OF SYSTEMS: Full 14 system review of systems performed and notable only for as above  ALLERGIES: Allergies  Allergen Reactions  . Keflex [Cephalexin] Anaphylaxis  . Flagyl [Metronidazole]     HOME MEDICATIONS: Current Outpatient Prescriptions  Medication Sig Dispense Refill  . ALPRAZolam (XANAX) 1 MG tablet Take 1 mg by mouth 4 (four) times  daily.  2  . ARIPiprazole (ABILIFY) 30 MG tablet Take 30 mg by mouth daily.  3  . Eszopiclone 3 MG TABS Take 3 mg by mouth at bedtime.  3  . HYDROcodone-acetaminophen (NORCO) 10-325 MG per tablet Take 1 tablet by mouth 2 (two) times daily as needed. for pain  0  . LATUDA 40 MG TABS tablet TAKE 1 TABLET EVERY EVENING WITH FOOD  1  . promethazine (PHENERGAN) 25 MG tablet Take 25 mg by mouth 3 (three) times daily as needed.  0  . QUEtiapine (SEROQUEL) 25 MG tablet Take 25 mg by mouth at bedtime.  2  . SUMAtriptan (IMITREX) 100 MG tablet TAKE 1 TABLET BY MOUTH. MAY TAKE REPEAT WITH 1 TABLET IN TWO HOURS IF NEEDED  5  . topiramate (TOPAMAX) 25 MG tablet TAKE 7-8 TABLETS AT BEDTIME  0     PAST MEDICAL HISTORY: Past Medical History:  Diagnosis Date  . Allergic rhinitis   . Anxiety   . Asthma   . Back pain   . Bipolar disorder ([place])   . Chronic pain   . Migraines   . Nasal polyps   . Nausea   . Neck pain   . OCD (obsessive compulsive disorder)     PAST SURGICAL HISTORY: Past Surgical History:  Procedure Laterality Date  . NASAL SINUS SURGERY    . WISDOM TOOTH  EXTRACTION      FAMILY HISTORY: Family History  Problem Relation Age of Onset  . Healthy Father   . Healthy Mother   . Arthritis Maternal Grandmother   . Arthritis Maternal Grandfather   . Depression Paternal Grandmother     SOCIAL HISTORY:  Social History   Social History  . Marital status: Married    Spouse name: N/A  . Number of children: 0  . Years of education: 14   Occupational History  . Unemployed    Social History Main Topics  . Smoking status: Current Every Day Smoker    Packs/day: 0.50    Types: Cigarettes  . Smokeless tobacco: Never Used  . Alcohol use 0.0 oz/week     Comment: Occasional alcohol use  . Drug use: No  . Sexual activity: Yes    Birth control/ protection: Condom   Other Topics Concern  . Not on file   Social History Narrative   Lives at home alone.   Right-handed.   3  glasses of green tea per day.       PHYSICAL EXAM   Vitals:   10/05/16 1602  BP: 134/83  Pulse: 80  Weight: 174 lb (78.9 kg)  Height: 5\' 10"  (1.778 m)    Not recorded      Body mass index is 24.97 kg/m.  PHYSICAL EXAMNIATION:  Gen: NAD, conversant, well nourised, obese, well groomed                     Cardiovascular: Regular rate rhythm, no peripheral edema, warm, nontender. Eyes: Conjunctivae clear without exudates or hemorrhage Neck: Supple, no carotid bruise. Pulmonary: Clear to auscultation bilaterally   NEUROLOGICAL EXAM:  MMSE - Mini Mental State Exam 10/05/2016  Orientation to time 4  Orientation to Place 5  Registration 3  Attention/ Calculation 5  Recall 1  Language- name 2 objects 2  Language- repeat 1  Language- follow 3 step command 3  Language- read & follow direction 1  Write a sentence 1  Copy design 0  Total score 26  animal naming 6.   CRANIAL NERVES: CN II: Visual fields are full to confrontation. Fundoscopic exam is normal with sharp discs and no vascular changes. Pupils are round equal and briskly reactive to light. CN III, IV, VI: extraocular movement are normal. No ptosis. CN V: Facial sensation is intact to pinprick in all 3 divisions bilaterally. Corneal responses are intact.  CN VII: Face is symmetric with normal eye closure and smile. CN VIII: Hearing is normal to rubbing fingers CN IX, X: Palate elevates symmetrically. Phonation is normal. CN XI: Head turning and shoulder shrug are intact CN XII: Tongue is midline with normal movements and no atrophy.  MOTOR: There is no pronator drift of out-stretched arms. Muscle bulk and tone are normal. Muscle strength is normal.  REFLEXES: Reflexes are 2+ and symmetric at the biceps, triceps, knees, and ankles. Plantar responses are flexor.  SENSORY: Intact to light touch, pinprick, position sense, and vibration sense are intact in fingers and toes.  COORDINATION: Rapid alternating  movements and fine finger movements are intact. There is no dysmetria on finger-to-nose and heel-knee-shin.    GAIT/STANCE: Antalgic, left big toe subinguinal hematoma     DIAGNOSTIC DATA (LABS, IMAGING, TESTING) - I reviewed patient records, labs, notes, testing and imaging myself where available.   ASSESSMENT AND PLAN  Carmen Daniels is a 31 y.o. female    Chronic migraine headaches with aura,  s/p concussion on October 03 2016.   toradol injection today.  Mobic prn, keep her botox injection on Sept 5th 2018  Marcial Pacas, M.D. Ph.D.  [facility] [address], [place], [place] [address] Ph: [phone] Fax: [phone]  CC: To Dr. [name], Dr. Nelva Bush, Delfino Lovett

## 2016-10-20 ENCOUNTER — Other Ambulatory Visit: Payer: Self-pay | Admitting: Neurology

## 2016-10-27 ENCOUNTER — Encounter: Payer: Self-pay | Admitting: Neurology

## 2016-10-27 ENCOUNTER — Ambulatory Visit (INDEPENDENT_AMBULATORY_CARE_PROVIDER_SITE_OTHER): Payer: Medicaid Other | Admitting: Neurology

## 2016-10-27 VITALS — BP 133/95 | HR 104 | Ht 70.0 in | Wt 170.0 lb

## 2016-10-27 DIAGNOSIS — G43719 Chronic migraine without aura, intractable, without status migrainosus: Secondary | ICD-10-CM

## 2016-10-27 DIAGNOSIS — R930 Abnormal findings on diagnostic imaging of skull and head, not elsewhere classified: Secondary | ICD-10-CM

## 2016-10-27 MED ORDER — KETOROLAC TROMETHAMINE 10 MG PO TABS: 10.0000 mg | ORAL_TABLET | Freq: Four times a day (QID) | ORAL | 6 refills | Status: DC | PRN

## 2016-10-27 NOTE — Progress Notes (Signed)
**  Botox 100 units x 2 vials, NDC 6924-9324-19, Lot [id number], Exp 04/2019, specialty pharmacy.//mck,rn**

## 2016-10-27 NOTE — Progress Notes (Signed)
Chief Complaint  Patient presents with  . Migraine    Botox 100 units x 2 vials - specialty pharmacy.      PATIENT: Carmen Daniels DOB: 01/18/1986  Chief Complaint  Patient presents with  . Migraine    Botox 100 units x 2 vials - specialty pharmacy.     HISTORICAL  Carmen Daniels is a 31 years old right-handed female, accompanied by her mother,, seen in refer by her pain management doctor Dr. Nelva Bush for evaluation of migraines  I have reviewed most recent office note in April 2016, she has past medical history of bipolar disorder, complains of frequent headaches, diffuse body achy pain, UDS was negative,   She complains of migraine headaches since 2011, her typical migraine started with tunnel vision, lasting for 10-15 minutes, followed by severe pounding headache with associated light noise sensitivity, sumatriptan 100 mg as needed has been very helpful, but sometimes she require repeat dosage. She is now having 4-5 typical prolonged migraines each week, lasting for 1 day,  Trigger for her migraines are stress, sleep deprivation,  She has tried over-the-counter aspirin, Tylenol, NSAIDs Excedrin Migraine, without helping her headaches, she has seen different neurologists in the past, has tried different preventive medications, including beta blocker, calcium channel blocker without helping, currently she is taking Topamax 25 mg 7 to 8 tablets every night, which is adjusted by her psychologist Dr. 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 No white matter disease or large vessel/lacunar infarction.  UPDATE Mar 27 2015: She is now taking Topamax 25 mg tablets 8 tablets every night, still has frequent migraine headaches, also consider Botox injection as  migraine prevention, she has variable frequency of migraine, few weeks ago, she had 3 weeks of daily headaches, Imitrex as needed is helpful in 60 minutes, she still has dull achy pain, but no longer has the intense headaches.  used up all 9 tablets of allowed Imitrex each month. She usually has visual aura proceeding each migraine headaches,  She is on polypharmacy for her mood disorder, currently taking Topamax, latuda, seroquel, Xanax, Abilify  UPDATE September 04 2015: She had her first Botox injection as migraine prevention in May 05 2015, she had frequent headache postinjection for 2 weeks, eventually she noticed a benefit, she has much less, less severe migraine, but even with the injection she has 2-3 migraines each week, noticed wearing off at the end of 3 months.  UPDATE Nov 28th 2017: She is having frequent headaches, couple times a week, reported 50% improvement with Botox injection, has much less frequent and less severe headaches. 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   UPDATE Jul 21 2016: She responded well to previous injection on April 21 2016, she did not notice significant side  effect, she reported less headache, headache has also become less intense,  She also tried Toradol IM injection for one prolonged headaches, which helps well for her, she used to her monthly supply of Imitrex tablets,  UPDATE October 05 2016: I reviewed her [place] controlled substance registry, in July 2018, she got tramadol 60 tablets, hydrocodone 10/Tylenol 60 tablets, Xanax 1 mg 120 tablets, tramadol again 60 tablets,   In June 2018, hydrocodone/Tylenol 10/325 mg 60 tablets, Xanax 1 mg 120 tablets, tramadol 30 tablet hydrocodone syrup 112, tramadol 30 tablets  I also reviewed her ED presentation of August 12th 2018, she slipped and fell forward face down, mild transient loss of consciousness, she had persistent headache, neck pain,nausea vomiting, blurry vision since the injury,  She also hurt her left great toe, with a black and  bruise, complains of pain,  I personally reviewed CT head without contrast August twelfth 2018, which showed no significant abnormality, CT cervical and facial/sinus showed no evidence of fracture  UPDATE Sept 5 2018: She went well with previous Botox injection, did has frequent headaches during her concussion October 03 2016.  REVIEW OF SYSTEMS: Full 14 system review of systems performed and notable only for as above  ALLERGIES: Allergies  Allergen Reactions  . Keflex [Cephalexin] Anaphylaxis  . Flagyl [Metronidazole]     HOME MEDICATIONS: Current Outpatient Prescriptions  Medication Sig Dispense Refill  . ALPRAZolam (XANAX) 1 MG tablet Take 1 mg by mouth 4 (four) times daily.  2  . ARIPiprazole (ABILIFY) 30 MG tablet Take 30 mg by mouth daily.  3  . Eszopiclone 3 MG TABS Take 3 mg by mouth at bedtime.  3  . HYDROcodone-acetaminophen (NORCO) 10-325 MG per tablet Take 1 tablet by mouth 2 (two) times daily as needed. for pain  0  . LATUDA 40 MG TABS tablet TAKE 1 TABLET EVERY EVENING WITH FOOD  1  . promethazine (PHENERGAN) 25 MG tablet Take 25 mg by mouth 3 (three) times daily as needed.  0  . QUEtiapine (SEROQUEL) 25 MG tablet Take 25 mg by mouth at bedtime.  2  . SUMAtriptan (IMITREX) 100 MG tablet TAKE 1 TABLET BY MOUTH. MAY TAKE REPEAT WITH 1 TABLET IN TWO HOURS IF NEEDED  5  . topiramate (TOPAMAX) 25  MG tablet TAKE 7-8 TABLETS AT BEDTIME  0     PAST MEDICAL HISTORY: Past Medical History:  Diagnosis Date  . Allergic rhinitis   . Anxiety   . Asthma   . Back pain   . Bipolar disorder ([place])   . Chronic pain   . Migraines   . Nasal polyps   . Nausea   . Neck pain   . OCD (obsessive compulsive disorder)     PAST SURGICAL HISTORY: Past Surgical History:  Procedure Laterality Date  . NASAL SINUS SURGERY    . WISDOM TOOTH EXTRACTION      FAMILY HISTORY: Family History  Problem Relation Age of Onset  . Healthy Father   . Healthy Mother   . Arthritis Maternal Grandmother   . Arthritis Maternal Grandfather   . Depression Paternal Grandmother     SOCIAL HISTORY:  Social History   Social History  . Marital status: Married    Spouse name: N/A  . Number of children: 0  . Years of education: 14   Occupational History  . Unemployed    Social History Main Topics  . Smoking status: Current Every Day Smoker    Packs/day: 0.50  Types: Cigarettes  . Smokeless tobacco: Never Used  . Alcohol use 0.0 oz/week     Comment: Occasional alcohol use  . Drug use: No  . Sexual activity: Yes    Birth control/ protection: Condom   Other Topics Concern  . Not on file   Social History Narrative   Lives at home alone.   Right-handed.   3 glasses of green tea per day.       PHYSICAL EXAM   Vitals:   10/27/16 1436  BP: (!) 133/95  Pulse: (!) 104  Weight: 170 lb (77.1 kg)  Height: 5\' 10"  (1.778 m)    Not recorded      Body mass index is 24.39 kg/m.  PHYSICAL EXAMNIATION:  Gen: NAD, conversant, well nourised, obese, well groomed                     Cardiovascular: Regular rate rhythm, no peripheral edema, warm, nontender. Eyes: Conjunctivae clear without exudates or hemorrhage Neck: Supple, no carotid bruise. Pulmonary: Clear to auscultation bilaterally   NEUROLOGICAL EXAM:  MMSE - Mini Mental State Exam 10/05/2016  Orientation to time 4  Orientation to  Place 5  Registration 3  Attention/ Calculation 5  Recall 1  Language- name 2 objects 2  Language- repeat 1  Language- follow 3 step command 3  Language- read & follow direction 1  Write a sentence 1  Copy design 0  Total score 26  animal naming 6.   CRANIAL NERVES: CN II: Visual fields are full to confrontation. Fundoscopic exam is normal with sharp discs and no vascular changes. Pupils are round equal and briskly reactive to light. CN III, IV, VI: extraocular movement are normal. No ptosis. CN V: Facial sensation is intact to pinprick in all 3 divisions bilaterally. Corneal responses are intact.  CN VII: Face is symmetric with normal eye closure and smile. CN VIII: Hearing is normal to rubbing fingers CN IX, X: Palate elevates symmetrically. Phonation is normal. CN XI: Head turning and shoulder shrug are intact CN XII: Tongue is midline with normal movements and no atrophy.  MOTOR: There is no pronator drift of out-stretched arms. Muscle bulk and tone are normal. Muscle strength is normal.  REFLEXES: Reflexes are 2+ and symmetric at the biceps, triceps, knees, and ankles. Plantar responses are flexor.  SENSORY: Intact to light touch, pinprick, position sense, and vibration sense are intact in fingers and toes.  COORDINATION: Rapid alternating movements and fine finger movements are intact. There is no dysmetria on finger-to-nose and heel-knee-shin.    GAIT/STANCE: Antalgic, left big toe subinguinal hematoma     DIAGNOSTIC DATA (LABS, IMAGING, TESTING) - I reviewed patient records, labs, notes, testing and imaging myself where available.   ASSESSMENT AND PLAN  ANYI FELS is a 31 y.o. female    Chronic migraine headaches with aura, s/p concussion on October 03 2016.   BOTOX injection was performed according to protocol by [employer]. 100 units of BOTOX was dissolved into 2 cc NS.      Extra 45 units were injected into her bilateral masseters muscle   Patient  tolerate the injection well. Will return for repeat injection in 3 months.  Abnormal MRI brain in 2015  Evidence of bilateral anterior middle fossa arachnoid cyst,  Patient's and family wants to repeat MRI for comparison orders placed  Marcial Pacas, M.D. Ph.D.  [facility] [address], [place], [place] [address] Ph: [phone] Fax: [phone]  CC: To Dr. [name], Dr. Nelva Bush, Delfino Lovett

## 2016-11-07 ENCOUNTER — Other Ambulatory Visit: Payer: No Typology Code available for payment source

## 2016-11-10 ENCOUNTER — Telehealth: Payer: Self-pay | Admitting: Neurology

## 2016-11-10 NOTE — Telephone Encounter (Signed)
Patient is calling to see if Dr. Krista Blue will call in a Rx for Tussionex. She has a bad sinus infection, bronchitis and coughing real bad. She says her PCP is on vacation so she saw another physician who diagnosed her with bronchitis. I asked why she did not call  that physician since he is treating her but she wanted to ask Dr. Krista Blue. [facility] uses [facility] on [address]

## 2016-11-10 NOTE — Telephone Encounter (Signed)
Spoke to patient - she will contact the physician who has evaluated and diagnosed her infection.

## 2016-11-16 ENCOUNTER — Telehealth: Payer: Self-pay | Admitting: Neurology

## 2016-11-16 ENCOUNTER — Encounter: Payer: Self-pay | Admitting: *Deleted

## 2016-11-16 NOTE — Telephone Encounter (Signed)
[facility] [phone] called in concerned about over use with ketorolac (TORADOL) 10 MG tablet . She said this medication is to be used only for 5 days but the pt has been getting refills every 5 days this month, she did take a break last month but June and July she was getting refills every 5 days. She is concerned with liver function and risk of bleeding as she does take other ansaids. Please call to discuss

## 2016-11-16 NOTE — Telephone Encounter (Signed)
Returned call to pharmacy - the patient has filled her ketorolac on the following dates: 10/27/16, 11/01/16, 11/05/16 (each time for #30).  The refills remaining at the pharmacy have been voided.  The patient has been called to discuss appropriate usage.  She verbalized understanding that the prescription for 30 tablets should be used sparingly and last for one month.  States she has all the medication at home with the exception of a few tablets.  She is aware that her next refill will be around 02/04/17.  She will call our office to request the refill.  When the medication is refilled, a note will be added to the instructions stating it is a 30-day supply.

## 2017-01-10 ENCOUNTER — Other Ambulatory Visit: Payer: Self-pay | Admitting: Neurology

## 2017-01-23 ENCOUNTER — Other Ambulatory Visit: Payer: Self-pay | Admitting: Neurology

## 2017-02-02 ENCOUNTER — Ambulatory Visit: Payer: Medicaid Other | Admitting: Neurology

## 2017-02-03 ENCOUNTER — Ambulatory Visit: Payer: Self-pay | Admitting: Neurology

## 2017-02-09 ENCOUNTER — Other Ambulatory Visit: Payer: Self-pay | Admitting: Neurology

## 2017-02-10 ENCOUNTER — Ambulatory Visit (INDEPENDENT_AMBULATORY_CARE_PROVIDER_SITE_OTHER): Payer: Medicaid Other | Admitting: Neurology

## 2017-02-10 ENCOUNTER — Telehealth: Payer: Self-pay | Admitting: Neurology

## 2017-02-10 ENCOUNTER — Other Ambulatory Visit: Payer: Self-pay | Admitting: *Deleted

## 2017-02-10 ENCOUNTER — Encounter (INDEPENDENT_AMBULATORY_CARE_PROVIDER_SITE_OTHER): Payer: Self-pay

## 2017-02-10 ENCOUNTER — Encounter: Payer: Self-pay | Admitting: Neurology

## 2017-02-10 VITALS — BP 137/84 | HR 100 | Ht 70.0 in | Wt 175.5 lb

## 2017-02-10 DIAGNOSIS — G43719 Chronic migraine without aura, intractable, without status migrainosus: Secondary | ICD-10-CM | POA: Diagnosis not present

## 2017-02-10 MED ORDER — SUMATRIPTAN SUCCINATE 6 MG/0.5ML ~~LOC~~ SOAJ: SUBCUTANEOUS | 3 refills | Status: DC

## 2017-02-10 MED ORDER — SUMATRIPTAN SUCCINATE 100 MG PO TABS: ORAL_TABLET | ORAL | 3 refills | Status: DC

## 2017-02-10 NOTE — Progress Notes (Signed)
**  Botox 100 units x 2 vials, NDC 0122-2411-46, Lot [id number], Exp 04/2019, specialty pharmacy.//mck,rn**

## 2017-02-10 NOTE — Progress Notes (Signed)
Chief Complaint  Patient presents with  . Migraine    Botox 100 units x 2 vials - specialty pharmacy.      PATIENT: Carmen Daniels DOB: 01/18/1986  Chief Complaint  Patient presents with  . Migraine    Botox 100 units x 2 vials - specialty pharmacy.     HISTORICAL  Carmen Daniels is a 31 years old right-handed female, accompanied by her mother,, seen in refer by her pain management doctor Dr. Nelva Bush for evaluation of migraines  I have reviewed most recent office note in April 2016, she has past medical history of bipolar disorder, complains of frequent headaches, diffuse body achy pain, UDS was negative,   She complains of migraine headaches since 2011, her typical migraine started with tunnel vision, lasting for 10-15 minutes, followed by severe pounding headache with associated light noise sensitivity, sumatriptan 100 mg as needed has been very helpful, but sometimes she require repeat dosage. She is now having 4-5 typical prolonged migraines each week, lasting for 1 day,  Trigger for her migraines are stress, sleep deprivation,  She has tried over-the-counter aspirin, Tylenol, NSAIDs Excedrin Migraine, without helping her headaches, she has seen different neurologists in the past, has tried different preventive medications, including beta blocker, calcium channel blocker without helping, currently she is taking Topamax 25 mg 7 to 8 tablets every night, which is adjusted by her psychologist Dr. 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 No white matter disease or large vessel/lacunar infarction.  UPDATE Mar 27 2015: She is now taking Topamax 25 mg tablets 8 tablets every night, still has frequent migraine headaches, also consider Botox injection as  migraine prevention, she has variable frequency of migraine, few weeks ago, she had 3 weeks of daily headaches, Imitrex as needed is helpful in 60 minutes, she still has dull achy pain, but no longer has the intense headaches.  used up all 9 tablets of allowed Imitrex each month. She usually has visual aura proceeding each migraine headaches,  She is on polypharmacy for her mood disorder, currently taking Topamax, latuda, seroquel, Xanax, Abilify  UPDATE September 04 2015: She had her first Botox injection as migraine prevention in May 05 2015, she had frequent headache postinjection for 2 weeks, eventually she noticed a benefit, she has much less, less severe migraine, but even with the injection she has 2-3 migraines each week, noticed wearing off at the end of 3 months.  UPDATE Nov 28th 2017: She is having frequent headaches, couple times a week, reported 50% improvement with Botox injection, has much less frequent and less severe headaches. 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   UPDATE Jul 21 2016: She responded well to previous injection on April 21 2016, she did not notice significant side  effect, she reported less headache, headache has also become less intense,  She also tried Toradol IM injection for one prolonged headaches, which helps well for her, she used to her monthly supply of Imitrex tablets,  UPDATE October 05 2016: I reviewed her [place] controlled substance registry, in July 2018, she got tramadol 60 tablets, hydrocodone 10/Tylenol 60 tablets, Xanax 1 mg 120 tablets, tramadol again 60 tablets,   In June 2018, hydrocodone/Tylenol 10/325 mg 60 tablets, Xanax 1 mg 120 tablets, tramadol 30 tablet hydrocodone syrup 112, tramadol 30 tablets  I also reviewed her ED presentation of August 12th 2018, she slipped and fell forward face down, mild transient loss of consciousness, she had persistent headache, neck pain,nausea vomiting, blurry vision since the injury,  She also hurt her left great toe, with a black and  bruise, complains of pain,  I personally reviewed CT head without contrast August twelfth 2018, which showed no significant abnormality, CT cervical and facial/sinus showed no evidence of fracture  UPDATE Sept 5 2018: She went well with previous Botox injection, did has frequent headaches during her concussion October 03 2016.  UPDATE Feb 10 2017:  She responded very well to Botox injection as migraine prevention  REVIEW OF SYSTEMS: Full 14 system review of systems performed and notable only for as above  ALLERGIES: Allergies  Allergen Reactions  . Keflex [Cephalexin] Anaphylaxis  . Flagyl [Metronidazole]     HOME MEDICATIONS: Current Outpatient Prescriptions  Medication Sig Dispense Refill  . ALPRAZolam (XANAX) 1 MG tablet Take 1 mg by mouth 4 (four) times daily.  2  . ARIPiprazole (ABILIFY) 30 MG tablet Take 30 mg by mouth daily.  3  . Eszopiclone 3 MG TABS Take 3 mg by mouth at bedtime.  3  . HYDROcodone-acetaminophen (NORCO) 10-325 MG per tablet Take 1 tablet by mouth 2 (two) times daily as needed. for pain  0  . LATUDA 40 MG TABS tablet TAKE 1 TABLET EVERY EVENING WITH FOOD  1  . promethazine (PHENERGAN) 25 MG tablet Take 25 mg by mouth 3 (three) times daily as needed.  0  . QUEtiapine (SEROQUEL) 25 MG tablet Take 25 mg by mouth at bedtime.  2  . SUMAtriptan (IMITREX) 100 MG tablet TAKE 1 TABLET BY  MOUTH. MAY TAKE REPEAT WITH 1 TABLET IN TWO HOURS IF NEEDED  5  . topiramate (TOPAMAX) 25 MG tablet TAKE 7-8 TABLETS AT BEDTIME  0     PAST MEDICAL HISTORY: Past Medical History:  Diagnosis Date  . Allergic rhinitis   . Anxiety   . Asthma   . Back pain   . Bipolar disorder ([place])   . Chronic pain   . Migraines   . Nasal polyps   . Nausea   . Neck pain   . OCD (obsessive compulsive disorder)     PAST SURGICAL HISTORY: Past Surgical History:  Procedure Laterality Date  . NASAL SINUS SURGERY    . WISDOM TOOTH EXTRACTION      FAMILY HISTORY: Family History  Problem Relation Age of Onset  . Healthy Father   . Healthy Mother   . Arthritis Maternal Grandmother   . Arthritis Maternal Grandfather   . Depression Paternal Grandmother     SOCIAL HISTORY:  Social History   Socioeconomic History  . Marital status: Married    Spouse name: Not on file  . Number of children: 0  . Years of education: 25  . Highest education level: Not on file  Social Needs  . Financial resource strain: Not on file  . Food insecurity - worry: Not on file  . Food insecurity - inability: Not on file  . Transportation needs - medical: Not on file  . Transportation needs - non-medical: Not on file  Occupational History  . Occupation: Unemployed  Tobacco Use  . Smoking status: Current Every Day Smoker    Packs/day: 0.50    Types: Cigarettes  . Smokeless tobacco: Never Used  Substance and Sexual Activity  . Alcohol use: Yes    Alcohol/week: 0.0 oz    Comment: Occasional alcohol use  . Drug use: No  . Sexual activity: Yes    Birth control/protection: Condom  Other Topics Concern  . Not on file  Social History Narrative   Lives at home alone.   Right-handed.   3 glasses of green tea per day.    PHYSICAL EXAM   Vitals:   02/10/17 1550  BP: 137/84  Pulse: 100  Weight: 175 lb 8 oz (79.6 kg)  Height: 5\' 10"  (1.778 m)    Not recorded      Body mass index is 25.18  kg/m.  PHYSICAL EXAMNIATION:  Gen: NAD, conversant, well nourised, obese, well groomed                     Cardiovascular: Regular rate rhythm, no peripheral edema, warm, nontender. Eyes: Conjunctivae clear without exudates or hemorrhage Neck: Supple, no carotid bruise. Pulmonary: Clear to auscultation bilaterally   NEUROLOGICAL EXAM:  MMSE - Mini Mental State Exam 10/05/2016  Orientation to time 4  Orientation to Place 5  Registration 3  Attention/ Calculation 5  Recall 1  Language- name 2 objects 2  Language- repeat 1  Language- follow 3 step command 3  Language- read & follow direction 1  Write a sentence 1  Copy design 0  Total score 26  animal naming 6.   CRANIAL NERVES: CN II: Visual fields are full to confrontation. Fundoscopic exam is normal with sharp discs and no vascular changes. Pupils are round equal and briskly reactive to light. CN III, IV, VI: extraocular movement are normal. No ptosis. CN V: Facial sensation is intact to pinprick in all 3 divisions bilaterally. Corneal responses are intact.  CN VII: Face is symmetric with normal eye closure and smile. CN VIII: Hearing is normal to rubbing fingers CN IX, X: Palate elevates symmetrically. Phonation is normal. CN XI: Head turning and shoulder shrug are intact CN XII: Tongue is midline with normal movements and no atrophy.  MOTOR: There is no pronator drift of out-stretched arms. Muscle bulk and tone are normal. Muscle strength is normal.  REFLEXES: Reflexes are 2+ and symmetric at the biceps, triceps, knees, and ankles. Plantar responses are flexor.  SENSORY: Intact to light touch, pinprick, position sense, and vibration sense are intact in fingers and toes.  COORDINATION: Rapid alternating movements and fine finger movements are intact. There is no dysmetria on finger-to-nose and heel-knee-shin.    GAIT/STANCE: Antalgic, left big toe subinguinal hematoma     DIAGNOSTIC DATA (LABS, IMAGING,  TESTING) - I reviewed patient records, labs, notes, testing and imaging myself where available.   ASSESSMENT AND PLAN  Carmen Daniels is a 31 y.o. female    Chronic migraine headaches with aura, s/p concussion on October 03 2016.   BOTOX injection was performed according to protocol by [employer]. 100 units of BOTOX was dissolved into 2 cc NS.  BOTOX injection was performed  according to protocol by [employer]. 100 units of BOTOX was dissolved into 2 cc NS.  BOTOX injection was performed according to protocol by [employer]. 100 units of BOTOX was dissolved into 2 cc NS.       We have skipped the injection to bilateral frontalis. Exta 65 units were injected into bilateral platysmus, masseters  Patient tolerate the injection well. Will return for repeat injection in 3 months.    Abnormal MRI brain in 2015  Evidence of bilateral anterior middle fossa arachnoid cyst,  Patient's and family wants to repeat MRI for comparison orders placed  Marcial Pacas, M.D. Ph.D.  [facility] [address] [place], [place] [address] Ph: [phone] Fax: [phone]  CC: To Dr. [name], Dr. Nelva Bush, Delfino Lovett

## 2017-02-10 NOTE — Telephone Encounter (Signed)
Pt. Needs botox in 3 months.

## 2017-02-11 NOTE — Telephone Encounter (Signed)
I called to schedule the patient for her next injection. She did not answer so I left a VM asking her to call me back.

## 2017-02-13 ENCOUNTER — Other Ambulatory Visit: Payer: Self-pay | Admitting: Neurology

## 2017-04-09 ENCOUNTER — Emergency Department (HOSPITAL_COMMUNITY)
Admission: EM | Admit: 2017-04-09 | Discharge: 2017-04-10 | Disposition: A | Discharge status: Other Acute Inpatient Hospital | Payer: Medicaid Other | Attending: Emergency Medicine | Admitting: Emergency Medicine

## 2017-04-09 ENCOUNTER — Other Ambulatory Visit: Payer: Self-pay

## 2017-04-09 ENCOUNTER — Encounter (HOSPITAL_COMMUNITY): Payer: Self-pay | Admitting: Emergency Medicine

## 2017-04-09 DIAGNOSIS — F312 Bipolar disorder, current episode manic severe with psychotic features: Secondary | ICD-10-CM | POA: Insufficient documentation

## 2017-04-09 DIAGNOSIS — Z79899 Other long term (current) drug therapy: Secondary | ICD-10-CM | POA: Insufficient documentation

## 2017-04-09 DIAGNOSIS — F17210 Nicotine dependence, cigarettes, uncomplicated: Secondary | ICD-10-CM | POA: Insufficient documentation

## 2017-04-09 DIAGNOSIS — Z046 Encounter for general psychiatric examination, requested by authority: Secondary | ICD-10-CM | POA: Diagnosis present

## 2017-04-09 LAB — CBC
HCT: 37.3 % (ref 36.0–46.0)
Hemoglobin: 12.8 g/dL (ref 12.0–15.0)
MCH: 31.1 pg (ref 26.0–34.0)
MCHC: 34.3 g/dL (ref 30.0–36.0)
MCV: 90.5 fL (ref 78.0–100.0)
PLATELETS: 284 10*3/uL (ref 150–400)
RBC: 4.12 MIL/uL (ref 3.87–5.11)
RDW: 12.1 % (ref 11.5–15.5)
WBC: 8.4 10*3/uL (ref 4.0–10.5)

## 2017-04-09 LAB — I-STAT BETA HCG BLOOD, ED (MC, WL, AP ONLY)

## 2017-04-09 MED ORDER — NICOTINE 21 MG/24HR TD PT24
21.0000 mg | MEDICATED_PATCH | Freq: Every day | TRANSDERMAL | Status: DC
  Administered 2017-04-10: 21 mg via TRANSDERMAL
  Filled 2017-04-09: qty 1

## 2017-04-09 MED ORDER — BECLOMETHASONE DIPROPIONATE 40 MCG/ACT IN AERS: 1.0000 | INHALATION_SPRAY | Freq: Two times a day (BID) | RESPIRATORY_TRACT | Status: DC | PRN

## 2017-04-09 MED ORDER — ONDANSETRON HCL 4 MG PO TABS: 4.0000 mg | ORAL_TABLET | Freq: Three times a day (TID) | ORAL | Status: DC | PRN

## 2017-04-09 MED ORDER — ZOLPIDEM TARTRATE 5 MG PO TABS: 5.0000 mg | ORAL_TABLET | Freq: Every evening | ORAL | Status: DC | PRN

## 2017-04-09 MED ORDER — ALUM & MAG HYDROXIDE-SIMETH 200-200-20 MG/5ML PO SUSP: 30.0000 mL | Freq: Four times a day (QID) | ORAL | Status: DC | PRN

## 2017-04-09 MED ORDER — ALBUTEROL SULFATE HFA 108 (90 BASE) MCG/ACT IN AERS: 1.0000 | INHALATION_SPRAY | RESPIRATORY_TRACT | Status: DC | PRN

## 2017-04-09 MED ORDER — IBUPROFEN 200 MG PO TABS
600.0000 mg | ORAL_TABLET | Freq: Three times a day (TID) | ORAL | Status: DC | PRN
  Administered 2017-04-10: 600 mg via ORAL
  Filled 2017-04-09: qty 3

## 2017-04-09 MED ORDER — BUDESONIDE 0.25 MG/2ML IN SUSP
0.2500 mg | Freq: Two times a day (BID) | RESPIRATORY_TRACT | Status: DC
  Filled 2017-04-09: qty 2

## 2017-04-09 NOTE — ED Provider Notes (Signed)
[facility] DEPT Provider Note   CSN: [id number] Arrival date & time: 04/09/17  2132     History   Chief Complaint Chief Complaint  Patient presents with  . IVC  . Medical Clearance    HPI Carmen Daniels is a 32 y.o. female who presents emergency department under involuntary commitment.  She is a past medical history of borderline personality disorder chronic pain, chronic pain, previous hospitalizations for psychiatric disorder, suicide attempts and self-harm.  She was committed by her parents.  Paperwork states that the patient cut herself multiple times and was found in her bathroom full of blood.  She has not been taking her medications and has told her parents multiple times that she is better off dead.  They feel she is a harm to herself.  States that she is "in immense pain."  She appears very sleepy if not intoxicated.  She does admit to taking her bipolar medications and states that she took 1 Percocet which she was prescribed by an orthopedist for herself lacerations.  She states that she takes 1 a day.  However patient then backtracked and said she did not take Percocet today.  She denies suicidal ideation, homicide visual  HPI  Past Medical History:  Diagnosis Date  . Allergic rhinitis   . Anxiety   . Asthma   . Back pain   . Bipolar disorder ([place])   . Chronic pain   . Migraines   . Nasal polyps   . Nausea   . Neck pain   . OCD (obsessive compulsive disorder)     Patient Active Problem List   Diagnosis Date Noted  . Abnormal MRI of head 04/21/2016  . Chronic migraine without aura 03/27/2015  . Mild persistent asthma 12/30/2014  . Allergic rhinitis due to pollen 12/30/2014  . 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 Nasal polyposis 12/30/2014    Past Surgical History:  Procedure Laterality Date  . NASAL SINUS SURGERY    . WISDOM TOOTH EXTRACTION      OB History    No data available       Home Medications    Prior to  Admission medications   Medication Sig Start Date End Date Taking? Authorizing Provider  beclomethasone (QVAR) 40 MCG/ACT inhaler Inhale 1 puff into the lungs 2 (two) times daily as needed (asthma).    Yes [provider]  buPROPion (WELLBUTRIN SR) 150 MG 12 hr tablet Take 150 mg by mouth daily.    Yes [provider]  clonazePAM (KLONOPIN) 1 MG tablet Take 1 mg by mouth 4 (four) times daily as needed for anxiety.  03/28/17  Yes [provider]  cyclobenzaprine (FLEXERIL) 10 MG tablet Take 5 mg by mouth every 6 (six) hours as needed for muscle spasms.  03/21/17  Yes [provider]  gabapentin (NEURONTIN) 600 MG tablet Take 600 mg by mouth 3 (three) times daily.   Yes [provider]  HYDROcodone-acetaminophen (NORCO) 10-325 MG per tablet Take 1 tablet by mouth 2 (two) times daily as needed for moderate pain or severe pain.  09/16/14  Yes [provider]  metroNIDAZOLE (FLAGYL) 500 MG tablet Take 500 mg by mouth 2 (two) times daily. 04/05/17  Yes [provider]  promethazine (PHENERGAN) 25 MG tablet Take 25 mg by mouth 3 (three) times daily as needed for nausea or vomiting.  09/16/14  Yes [provider]  SUMAtriptan (IMITREX) 100 MG tablet Take 1 tab at onset of migraine.  May repeat in 2 hrs, if needed.  Max dose: 2 tabs/day. This is a 30 day prescription. 02/10/17  Yes Marcial Pacas, MD  traMADol (ULTRAM) 50 MG tablet Take 50 mg by mouth 2 (two) times daily as needed for moderate pain or severe pain.    Yes [provider]  VENTOLIN HFA 108 (90 BASE) MCG/ACT inhaler INHALE 2 PUFFS EVERY 4-6HRS AS NEEDED FOR COUGH/WHEEZE MAY USE 2PUFFS 10-20MINS PRIOR TO EXERCISE 01/09/15  Yes Bardelas, Jose A, MD  ketorolac (TORADOL) 10 MG tablet Take 1 tablet (10 mg total) by mouth every 6 (six) hours as needed. Patient not taking: Reported on 04/09/2017 10/27/16   Marcial Pacas, MD  naproxen (NAPROSYN) 500 MG tablet Take 1 tablet (500 mg total) by  mouth 2 (two) times daily. Patient not taking: Reported on 04/09/2017 10/03/16   Horton, Barbette Hair, MD  ondansetron (ZOFRAN ODT) 4 MG disintegrating tablet Take 1 tablet (4 mg total) by mouth every 8 (eight) hours as needed for nausea or vomiting. Patient not taking: Reported on 04/09/2017 10/03/16   Horton, Barbette Hair, MD  SUMAtriptan 6 MG/0.5ML SOAJ Inject 6mg  at onset of migraine. May repeat in 2 hours, if needed.  Max of 2 injections in 24 hours. Patient not taking: Reported on 04/09/2017 02/10/17   Marcial Pacas, MD    Family History Family History  Problem Relation Age of Onset  . Healthy Father   . Healthy Mother   . Arthritis Maternal Grandmother   . Arthritis Maternal Grandfather   . Depression Paternal Grandmother     Social History Social History   Tobacco Use  . Smoking status: Current Every Day Smoker    Packs/day: 0.50    Types: Cigarettes  . Smokeless tobacco: Never Used  Substance Use Topics  . Alcohol use: Yes    Alcohol/week: 0.0 oz    Comment: Occasional alcohol use  . Drug use: No     Allergies   Keflex [cephalexin] and Flagyl [metronidazole]   Review of Systems Review of Systems Ten systems reviewed and are negative for acute change, except as noted in the HPI.    Physical Exam Updated Vital Signs BP 115/67 (BP Location: Left Arm)   Pulse 79   Temp 97.9 F (36.6 C) (Oral)   Resp 18   Ht 5\' 11"  (1.803 m)   Wt 74.8 kg (165 lb)   LMP 03/19/2017   SpO2 100%   BMI 23.01 kg/m   Physical Exam  Constitutional: She is oriented to person, place, and time. She appears well-developed and well-nourished. No distress.  HENT:  Head: Normocephalic and atraumatic.  Eyes: Conjunctivae are normal. No scleral icterus.  Neck: Normal range of motion.  Cardiovascular: Normal rate, regular rhythm and normal heart sounds. Exam reveals no gallop and no friction rub.  No murmur heard. Pulmonary/Chest: Effort normal and breath sounds normal. No respiratory distress.   Abdominal: Soft. Bowel sounds are normal. She exhibits no distension and no mass. There is no tenderness. There is no guarding.  Neurological: She is alert and oriented to person, place, and time.  Skin: Skin is warm and dry. She is not diaphoretic.  Multiple well- healing self Inflicted lacerations of the left forearm;  Psychiatric:  Manipulative behavior Somnolent   Nursing note and vitals reviewed.    ED Treatments / Results  Labs (all labs ordered are listed, but only abnormal results are displayed) Labs Reviewed  ACETAMINOPHEN LEVEL - Abnormal; Notable for the following components:  Result Value   Acetaminophen (Tylenol), Serum 39 (*)    All other components within normal limits  RAPID URINE DRUG SCREEN, HOSP PERFORMED - Abnormal; Notable for the following components:   Opiates POSITIVE (*)    Benzodiazepines POSITIVE (*)    All other components within normal limits  COMPREHENSIVE METABOLIC PANEL  ETHANOL  SALICYLATE LEVEL  CBC  I-STAT BETA HCG BLOOD, ED ([facility], [facility], [facility] ONLY)    EKG  EKG Interpretation None       Radiology No results found.  Procedures Procedures (including critical care time)  Medications Ordered in ED Medications  zolpidem (AMBIEN) tablet 5 mg (not administered)  ondansetron (ZOFRAN) tablet 4 mg (not administered)  alum & mag hydroxide-simeth (MAALOX/MYLANTA) 200-200-20 MG/5ML suspension 30 mL (not administered)  nicotine (NICODERM CQ - dosed in mg/24 hours) patch 21 mg (not administered)  ibuprofen (ADVIL,MOTRIN) tablet 600 mg (600 mg Oral Given 04/10/17 0437)  albuterol (PROVENTIL HFA;VENTOLIN HFA) 108 (90 Base) MCG/ACT inhaler 1-2 puff (not administered)  budesonide (PULMICORT) nebulizer solution 0.25 mg (0.25 mg Nebulization Not Given 04/10/17 0047)  metroNIDAZOLE (FLAGYL) tablet 500 mg (500 mg Oral Given 04/10/17 0121)     Initial Impression / Assessment and Plan / ED Course  I have reviewed the triage vital signs and the nursing  notes.  Pertinent labs & imaging results that were available during my care of the patient were reviewed by me and considered in my medical decision making (see chart for details).       Final Clinical Impressions(s) / ED Diagnoses   Final diagnoses:  Involuntary commitment    ED Discharge Orders    None       Margarita Mail, PA-C 27/06/23 [id number]    Delora Fuel, MD 31/51/76 [phone]

## 2017-04-09 NOTE — ED Notes (Signed)
Bed: WTR7 Expected date:  Expected time:  Means of arrival:  Comments: 

## 2017-04-09 NOTE — ED Triage Notes (Signed)
Pt presents by [facility] Department under IVC by parents. Pt currently denies any SI at this time. Pt reports to being a cutter to left arm. Pt reports that she had agreed with her parents to follow up for outpatient care. IVC forms state that the pt had voiced SI ideation.

## 2017-04-09 NOTE — ED Notes (Signed)
Pt has one white pt belongings bag that is kept at the nurse's station in triage.

## 2017-04-09 NOTE — ED Notes (Signed)
Pt reports having a relapse of self-harming which caused her parents to IVC her. She denies SI.

## 2017-04-10 ENCOUNTER — Inpatient Hospital Stay (HOSPITAL_COMMUNITY)
Admission: AD | Admit: 2017-04-10 | Discharge: 2017-04-15 | DRG: 885 | Disposition: A | Discharge status: Home / Self Care | Payer: Medicaid Other | Source: Intra-hospital | Attending: Psychiatry | Admitting: Psychiatry

## 2017-04-10 ENCOUNTER — Encounter (HOSPITAL_COMMUNITY): Payer: Self-pay

## 2017-04-10 DIAGNOSIS — Z818 Family history of other mental and behavioral disorders: Secondary | ICD-10-CM | POA: Diagnosis not present

## 2017-04-10 DIAGNOSIS — F17210 Nicotine dependence, cigarettes, uncomplicated: Secondary | ICD-10-CM | POA: Diagnosis not present

## 2017-04-10 DIAGNOSIS — G894 Chronic pain syndrome: Secondary | ICD-10-CM | POA: Diagnosis not present

## 2017-04-10 DIAGNOSIS — X789XXA Intentional self-harm by unspecified sharp object, initial encounter: Secondary | ICD-10-CM | POA: Diagnosis not present

## 2017-04-10 DIAGNOSIS — F429 Obsessive-compulsive disorder, unspecified: Secondary | ICD-10-CM | POA: Diagnosis present

## 2017-04-10 DIAGNOSIS — F3189 Other bipolar disorder: Secondary | ICD-10-CM | POA: Diagnosis present

## 2017-04-10 DIAGNOSIS — Z630 Problems in relationship with spouse or partner: Secondary | ICD-10-CM | POA: Diagnosis not present

## 2017-04-10 DIAGNOSIS — R0981 Nasal congestion: Secondary | ICD-10-CM

## 2017-04-10 DIAGNOSIS — Z560 Unemployment, unspecified: Secondary | ICD-10-CM | POA: Diagnosis not present

## 2017-04-10 DIAGNOSIS — F419 Anxiety disorder, unspecified: Secondary | ICD-10-CM | POA: Diagnosis present

## 2017-04-10 DIAGNOSIS — G8929 Other chronic pain: Secondary | ICD-10-CM | POA: Diagnosis present

## 2017-04-10 DIAGNOSIS — R450 Nervousness: Secondary | ICD-10-CM | POA: Diagnosis not present

## 2017-04-10 DIAGNOSIS — Z915 Personal history of self-harm: Secondary | ICD-10-CM | POA: Diagnosis not present

## 2017-04-10 DIAGNOSIS — F39 Unspecified mood [affective] disorder: Secondary | ICD-10-CM | POA: Diagnosis not present

## 2017-04-10 DIAGNOSIS — F603 Borderline personality disorder: Secondary | ICD-10-CM

## 2017-04-10 DIAGNOSIS — F312 Bipolar disorder, current episode manic severe with psychotic features: Principal | ICD-10-CM | POA: Diagnosis present

## 2017-04-10 DIAGNOSIS — J45909 Unspecified asthma, uncomplicated: Secondary | ICD-10-CM | POA: Diagnosis present

## 2017-04-10 DIAGNOSIS — G43909 Migraine, unspecified, not intractable, without status migrainosus: Secondary | ICD-10-CM | POA: Diagnosis present

## 2017-04-10 DIAGNOSIS — G4700 Insomnia, unspecified: Secondary | ICD-10-CM | POA: Diagnosis not present

## 2017-04-10 DIAGNOSIS — Z23 Encounter for immunization: Secondary | ICD-10-CM

## 2017-04-10 DIAGNOSIS — F319 Bipolar disorder, unspecified: Secondary | ICD-10-CM | POA: Diagnosis present

## 2017-04-10 LAB — COMPREHENSIVE METABOLIC PANEL
ALT: 14 U/L (ref 14–54)
AST: 18 U/L (ref 15–41)
Albumin: 4 g/dL (ref 3.5–5.0)
Alkaline Phosphatase: 60 U/L (ref 38–126)
Anion gap: 9 (ref 5–15)
BILIRUBIN TOTAL: 0.6 mg/dL (ref 0.3–1.2)
BUN: 10 mg/dL (ref 6–20)
CO2: 24 mmol/L (ref 22–32)
Calcium: 8.9 mg/dL (ref 8.9–10.3)
Chloride: 104 mmol/L (ref 101–111)
Creatinine, Ser: 0.77 mg/dL (ref 0.44–1.00)
Glucose, Bld: 95 mg/dL (ref 65–99)
POTASSIUM: 3.8 mmol/L (ref 3.5–5.1)
Sodium: 137 mmol/L (ref 135–145)
TOTAL PROTEIN: 6.6 g/dL (ref 6.5–8.1)

## 2017-04-10 LAB — RAPID URINE DRUG SCREEN, HOSP PERFORMED
Amphetamines: NOT DETECTED
BARBITURATES: NOT DETECTED
Benzodiazepines: POSITIVE — AB
Cocaine: NOT DETECTED
OPIATES: POSITIVE — AB
TETRAHYDROCANNABINOL: NOT DETECTED

## 2017-04-10 LAB — ACETAMINOPHEN LEVEL: Acetaminophen (Tylenol), Serum: 39 ug/mL — ABNORMAL HIGH (ref 10–30)

## 2017-04-10 LAB — ETHANOL

## 2017-04-10 LAB — SALICYLATE LEVEL

## 2017-04-10 MED ORDER — TRAZODONE HCL 50 MG PO TABS: 150.0000 mg | ORAL_TABLET | Freq: Every evening | ORAL | Status: DC | PRN

## 2017-04-10 MED ORDER — ALUM & MAG HYDROXIDE-SIMETH 200-200-20 MG/5ML PO SUSP
30.0000 mL | Freq: Four times a day (QID) | ORAL | Status: DC | PRN
  Administered 2017-04-12: 30 mL via ORAL
  Filled 2017-04-10: qty 30

## 2017-04-10 MED ORDER — RISPERIDONE 1 MG PO TABS
1.0000 mg | ORAL_TABLET | Freq: Once | ORAL | Status: AC
  Administered 2017-04-10: 1 mg via ORAL
  Filled 2017-04-10 (×2): qty 1

## 2017-04-10 MED ORDER — METRONIDAZOLE 500 MG PO TABS
500.0000 mg | ORAL_TABLET | Freq: Two times a day (BID) | ORAL | Status: DC
  Administered 2017-04-10 (×2): 500 mg via ORAL
  Filled 2017-04-10 (×2): qty 1

## 2017-04-10 MED ORDER — NAPROXEN 500 MG PO TABS
500.0000 mg | ORAL_TABLET | Freq: Two times a day (BID) | ORAL | Status: DC
  Administered 2017-04-10 – 2017-04-15 (×9): 500 mg via ORAL
  Filled 2017-04-10 (×16): qty 1

## 2017-04-10 MED ORDER — BUPROPION HCL ER (SR) 150 MG PO TB12
150.0000 mg | ORAL_TABLET | Freq: Every day | ORAL | Status: DC
  Administered 2017-04-11 – 2017-04-13 (×3): 150 mg via ORAL
  Filled 2017-04-10 (×4): qty 1

## 2017-04-10 MED ORDER — PNEUMOCOCCAL VAC POLYVALENT 25 MCG/0.5ML IJ INJ
0.5000 mL | INJECTION | INTRAMUSCULAR | Status: AC
  Administered 2017-04-11: 0.5 mL via INTRAMUSCULAR

## 2017-04-10 MED ORDER — BECLOMETHASONE DIPROPIONATE 40 MCG/ACT IN AERS: 1.0000 | INHALATION_SPRAY | Freq: Two times a day (BID) | RESPIRATORY_TRACT | Status: DC | PRN

## 2017-04-10 MED ORDER — ONDANSETRON HCL 4 MG PO TABS: 4.0000 mg | ORAL_TABLET | Freq: Three times a day (TID) | ORAL | Status: DC | PRN

## 2017-04-10 MED ORDER — GABAPENTIN 600 MG PO TABS: 600.0000 mg | ORAL_TABLET | Freq: Three times a day (TID) | ORAL | Status: DC

## 2017-04-10 MED ORDER — TRAZODONE HCL 150 MG PO TABS: 150.0000 mg | ORAL_TABLET | Freq: Every evening | ORAL | Status: DC | PRN

## 2017-04-10 MED ORDER — GABAPENTIN 400 MG PO CAPS
800.0000 mg | ORAL_CAPSULE | Freq: Three times a day (TID) | ORAL | Status: DC
  Administered 2017-04-10: 800 mg via ORAL
  Filled 2017-04-10: qty 2

## 2017-04-10 MED ORDER — BECLOMETHASONE DIPROP HFA 40 MCG/ACT IN AERB: 1.0000 | INHALATION_SPRAY | Freq: Two times a day (BID) | RESPIRATORY_TRACT | Status: DC | PRN

## 2017-04-10 MED ORDER — ALBUTEROL SULFATE HFA 108 (90 BASE) MCG/ACT IN AERS
1.0000 | INHALATION_SPRAY | RESPIRATORY_TRACT | Status: DC | PRN
  Administered 2017-04-13: 2 via RESPIRATORY_TRACT
  Filled 2017-04-10: qty 6.7

## 2017-04-10 MED ORDER — NAPROXEN 500 MG PO TABS
500.0000 mg | ORAL_TABLET | Freq: Two times a day (BID) | ORAL | Status: DC
  Administered 2017-04-10: 500 mg via ORAL
  Filled 2017-04-10: qty 1

## 2017-04-10 MED ORDER — CYCLOBENZAPRINE HCL 10 MG PO TABS: 5.0000 mg | ORAL_TABLET | Freq: Four times a day (QID) | ORAL | Status: DC | PRN

## 2017-04-10 MED ORDER — TRAMADOL HCL 50 MG PO TABS
50.0000 mg | ORAL_TABLET | Freq: Once | ORAL | Status: AC
  Administered 2017-04-10: 50 mg via ORAL
  Filled 2017-04-10: qty 1

## 2017-04-10 MED ORDER — LORAZEPAM 1 MG PO TABS
1.0000 mg | ORAL_TABLET | Freq: Four times a day (QID) | ORAL | Status: DC | PRN
  Administered 2017-04-10: 1 mg via ORAL
  Filled 2017-04-10: qty 1

## 2017-04-10 MED ORDER — BUPROPION HCL ER (SR) 150 MG PO TB12
150.0000 mg | ORAL_TABLET | Freq: Every day | ORAL | Status: DC
  Administered 2017-04-10: 150 mg via ORAL
  Filled 2017-04-10: qty 1

## 2017-04-10 MED ORDER — NICOTINE 21 MG/24HR TD PT24
21.0000 mg | MEDICATED_PATCH | Freq: Every day | TRANSDERMAL | Status: DC
  Administered 2017-04-11: 21 mg via TRANSDERMAL
  Filled 2017-04-10 (×3): qty 1

## 2017-04-10 MED ORDER — HYDROXYZINE HCL 25 MG PO TABS
25.0000 mg | ORAL_TABLET | Freq: Three times a day (TID) | ORAL | Status: DC | PRN
  Administered 2017-04-10 – 2017-04-11 (×2): 25 mg via ORAL
  Filled 2017-04-10 (×2): qty 1

## 2017-04-10 MED ORDER — GABAPENTIN 400 MG PO CAPS
800.0000 mg | ORAL_CAPSULE | Freq: Three times a day (TID) | ORAL | Status: DC
  Administered 2017-04-10 – 2017-04-14 (×12): 800 mg via ORAL
  Filled 2017-04-10 (×15): qty 2

## 2017-04-10 MED ORDER — MAGNESIUM HYDROXIDE 400 MG/5ML PO SUSP: 30.0000 mL | Freq: Every day | ORAL | Status: DC | PRN

## 2017-04-10 MED ORDER — CYCLOBENZAPRINE HCL 10 MG PO TABS
5.0000 mg | ORAL_TABLET | Freq: Four times a day (QID) | ORAL | Status: DC | PRN
  Administered 2017-04-11 – 2017-04-13 (×2): 5 mg via ORAL
  Filled 2017-04-10 (×2): qty 1

## 2017-04-10 NOTE — ED Notes (Signed)
[facility] on unit to transfer pt to [facility] per MD order. Personal property given to [employer] for transfer. Pt ambulatory off unit in police custody.

## 2017-04-10 NOTE — BH Assessment (Signed)
[facility] Assessment Progress Note    Spoke with patient's father Clerance Lav) in the ED who indicated that he feels like his daughter's behavior is out of control and he states that she has been threatening suicide and he states that they are also concerned about her abuse of prescription medications.  He states that he and his wife has been enabling her by providing her with a home to live in and he states that they pay all of her bills because she is not working.  Father reports that he and his wife have decided that they can no longer enable her and plan to provide her with a contingency plan.  If she wants to continue to have their support, she is going to have to agree to get the help that she needs or they are going to take her home away from her and make her responsible for herself. Father states that his daughter is very manipulative and that she knows all the right things to say in order to get out of things and he is concerned that she will get herself out of this situation without getting the help that she needs. He states that he and his wife would like to be involved in her treatment process, if possible.

## 2017-04-10 NOTE — BH Assessment (Signed)
[facility] Assessment Progress Note      Dr. Darleene Cleaver recommends inpatient placement.

## 2017-04-10 NOTE — Progress Notes (Signed)
Adult Psychoeducational Group Note  Date:  04/10/2017 Time:  10:30 PM  Group Topic/Focus:  Wrap-Up Group:   The focus of this group is to help patients review their daily goal of treatment and discuss progress on daily workbooks.  Participation Level:  Active  Participation Quality:  Appropriate and Sharing  Affect:  Appropriate  Cognitive:  Alert, Appropriate and Oriented  Insight: Good  Engagement in Group:  Engaged  Modes of Intervention:  Discussion  Additional Comments:  Pt stated she did not have a goal, since she arrived earlier. Her goal is to get some rest and to come up with healthier coping methods, rather than cutting. Pt rated her day a 1/10 because she was forced to come here by her parents, who she is upset with.   [place] 04/10/2017, 10:30 PM

## 2017-04-10 NOTE — BH Assessment (Signed)
[facility] Assessment Progress Note     Patient has been accepted to [facility] pending bed availability once discharges have been completed and rooms cleaned.
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
## 2017-04-10 NOTE — Progress Notes (Signed)
Plan for patient to be accepted to [facility] today. Awaiting bed availability. Expected admission to unit later today.  Clayborne Dana, RN

## 2017-04-10 NOTE — ED Notes (Signed)
SBAR Report received from previous nurse. Pt received calm and visible on unit. Pt denies current SI/ HI, A/V H, depression, anxiety, or pain at this time, and appears otherwise stable and free of distress. Pt reminded of camera surveillance, q 15 min rounds, and rules of the milieu. Will continue to assess. 

## 2017-04-10 NOTE — Progress Notes (Signed)
Per Corene Cornea berry, NP pt is recommended for overnight observation and to be reassessed in the AM by psychiatry. EDP Margarita Mail, PA-C and pt's nurse Bethena Roys, RN have been advised of the disposition.   Lind Covert, MSW, LCSW Therapeutic Triage Specialist  [phone]
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
## 2017-04-10 NOTE — ED Notes (Addendum)
Pt anxious, defensive, tearful, argumentative. Pt becoming increasingly agitated. Hyper focused on "Xanax"  Encouragement and support provided. Special checks q 15 mins in place for safety,Video monitoring in place. Will continue to monitor.

## 2017-04-10 NOTE — ED Notes (Signed)
Pt c/o pain at left arm, demanding percocet, reports prn Motrin does not work. Informed pt percocet is not ordered at this time, pt reports she will wait  to see doctor this morning to express concerns.  Encouragement and support provided. Will continue to monitor.

## 2017-04-10 NOTE — Tx Team (Signed)
Initial Treatment Plan 04/10/2017 7:41 PM SRI CLEGG VPX:[id number]    PATIENT STRESSORS: Marital or family conflict Medication change or noncompliance   PATIENT STRENGTHS: Average or above average intelligence Capable of independent living Communication skills Motivation for treatment/growth Supportive family/friends   PATIENT IDENTIFIED PROBLEMS:   "I guess I need a healthier way to cope"                   DISCHARGE CRITERIA:  Improved stabilization in mood, thinking, and/or behavior Reduction of life-threatening or endangering symptoms to within safe limits Verbal commitment to aftercare and medication compliance  PRELIMINARY DISCHARGE PLAN: Attend aftercare/continuing care group Outpatient therapy Return to previous living arrangement  PATIENT/FAMILY INVOLVEMENT: This treatment plan has been presented to and reviewed with the patient, Carmen Daniels, [place] patient has been given the opportunity to ask questions and make suggestions.  Waymond Cera, RN 04/10/2017, 7:41 PM

## 2017-04-11 DIAGNOSIS — Z630 Problems in relationship with spouse or partner: Secondary | ICD-10-CM

## 2017-04-11 DIAGNOSIS — G43909 Migraine, unspecified, not intractable, without status migrainosus: Secondary | ICD-10-CM

## 2017-04-11 DIAGNOSIS — X789XXA Intentional self-harm by unspecified sharp object, initial encounter: Secondary | ICD-10-CM

## 2017-04-11 DIAGNOSIS — Z818 Family history of other mental and behavioral disorders: Secondary | ICD-10-CM

## 2017-04-11 DIAGNOSIS — F312 Bipolar disorder, current episode manic severe with psychotic features: Principal | ICD-10-CM

## 2017-04-11 DIAGNOSIS — G894 Chronic pain syndrome: Secondary | ICD-10-CM

## 2017-04-11 DIAGNOSIS — F603 Borderline personality disorder: Secondary | ICD-10-CM

## 2017-04-11 DIAGNOSIS — F17210 Nicotine dependence, cigarettes, uncomplicated: Secondary | ICD-10-CM

## 2017-04-11 MED ORDER — HYDROXYZINE HCL 50 MG PO TABS
50.0000 mg | ORAL_TABLET | Freq: Three times a day (TID) | ORAL | Status: DC | PRN
  Administered 2017-04-12 – 2017-04-14 (×4): 50 mg via ORAL
  Filled 2017-04-11 (×5): qty 1

## 2017-04-11 MED ORDER — TRAMADOL HCL 50 MG PO TABS
50.0000 mg | ORAL_TABLET | Freq: Once | ORAL | Status: AC
  Administered 2017-04-11: 50 mg via ORAL
  Filled 2017-04-11: qty 1

## 2017-04-11 MED ORDER — NICOTINE POLACRILEX 2 MG MT GUM
2.0000 mg | CHEWING_GUM | OROMUCOSAL | Status: DC | PRN
  Administered 2017-04-11 – 2017-04-12 (×2): 2 mg via ORAL
  Filled 2017-04-11: qty 1

## 2017-04-11 MED ORDER — METRONIDAZOLE 500 MG PO TABS
500.0000 mg | ORAL_TABLET | Freq: Three times a day (TID) | ORAL | Status: AC
  Administered 2017-04-11 – 2017-04-15 (×12): 500 mg via ORAL
  Filled 2017-04-11 (×7): qty 1
  Filled 2017-04-11: qty 2
  Filled 2017-04-11 (×5): qty 1

## 2017-04-11 MED ORDER — CLONAZEPAM 0.5 MG PO TABS
0.5000 mg | ORAL_TABLET | Freq: Two times a day (BID) | ORAL | Status: AC
  Administered 2017-04-11 – 2017-04-12 (×2): 0.5 mg via ORAL
  Filled 2017-04-11 (×2): qty 1

## 2017-04-11 MED ORDER — RISPERIDONE 1 MG PO TABS
1.0000 mg | ORAL_TABLET | Freq: Every day | ORAL | Status: DC
  Administered 2017-04-11 – 2017-04-12 (×2): 1 mg via ORAL
  Filled 2017-04-11 (×4): qty 1

## 2017-04-11 NOTE — [facility] Group Notes (Signed)
[facility] LCSW Group Therapy Note  Date/Time: 04/11/17, 1315  Type of Therapy and Topic:  Group Therapy:  Overcoming Obstacles  Participation Level:  active  Description of Group:    In this group patients will be encouraged to explore what they see as obstacles to their own wellness and recovery. They will be guided to discuss their thoughts, feelings, and behaviors related to these obstacles. The group will process together ways to cope with barriers, with attention given to specific choices patients can make. Each patient will be challenged to identify changes they are motivated to make in order to overcome their obstacles. This group will be process-oriented, with patients participating in exploration of their own experiences as well as giving and receiving support and challenge from other group members.  Therapeutic Goals: 1. Patient will identify personal and current obstacles as they relate to admission. 2. Patient will identify barriers that currently interfere with their wellness or overcoming obstacles.  3. Patient will identify feelings, thought process and behaviors related to these barriers. 4. Patient will identify two changes they are willing to make to overcome these obstacles:    Summary of Patient Progress: Pt identified her bipolar diagnosis and other mental health dx (borderline) as the main obstacles to her life currently.  Pt was active in group discussion regarding ways to overcome obstacles.      Therapeutic Modalities:   Cognitive Behavioral Therapy Solution Focused Therapy Motivational Interviewing Relapse Prevention Therapy  Lurline Idol, LCSW
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
## 2017-04-11 NOTE — Progress Notes (Signed)
Writer spoke with patient 1:1 she was requesting medication for pain. When writer informed her of what she had available she became even more concerned and focused on her medications. Writer informed her that her pain medications would have to be approved by her doctor. She received medications available but continued to request other medications. She has been observed up in the dayroom interacting with peers after reporting pain at a 25. She requested her arm be bandaged for the night. Support given and safety maintained on unit with 15 min checks.

## 2017-04-11 NOTE — Plan of Care (Signed)
Nurse discussed medications, anxiety and coping skills with patient.

## 2017-04-11 NOTE — Progress Notes (Signed)
D:  Patient's self inventory sheet, patient sleeps good, sleep medication helpful.  Good appetite, low-normal energy level, good concentration.  Rated depression 1, zero hopeless, anxiety 10.  Denied withdrawals.  Denied SI.  Physical problems, lightheaded, pain, dizziness, upset stomach!  Physical pain, arm, neck, back, general fibromyalgia.  Goal is get pain under control!  Plans to ask MD for prescribed medications.  Having diarrhea.  Need help to bathe, need pain medicine. A:  Medications administered per MD orders.  Emotional support and encouragement given patient. R:  Denied SI and HI, contracts for safety.  Denied A/V hallucinations.  Safety maintained with 15 minute checks. Patient did not want to take her morning meds today.  Stated those medicines would not help her.  That she needs her prescribed meds Xanax and Percocet.  Nurse encouraged patient to take scheduled meds and also talk to MD about home medications this morning.  Patient finally did take her scheduled meds and took nap.   Patient also stated that she takes flagyl for bacterial vaginal infection which she will also discuss with MD. Patient removed bandage on L arm.  No signs of redness, swelling, drainage. Patient stated she will take shower after recreation this morning.

## 2017-04-11 NOTE — Tx Team (Signed)
Interdisciplinary Treatment and Diagnostic Plan Update  04/11/2017 Time of Session: 0737 [name] MRN: [id number]  Principal Diagnosis: Borderline personality disorder [facility])  Secondary Diagnoses: Principal Problem:   Borderline personality disorder ([name]) Active Problems:   Bipolar disorder ([name])   Current Medications:  Current Facility-Administered Medications  Medication Dose Route Frequency Provider Last Rate Last Dose  . albuterol (PROVENTIL HFA;VENTOLIN HFA) 108 (90 Base) MCG/ACT inhaler 1-2 puff  1-2 puff Inhalation Q4H PRN [name], [name], NP      . alum & mag hydroxide-simeth (MAALOX/MYLANTA) 200-200-20 MG/5ML suspension 30 mL  30 mL Oral Q6H PRN [name], [name], NP      . beclomethasone (QVAR) 40 MCG/ACT inhaler 1 puff  1 puff Inhalation BID PRN [name], [name], NP      . buPROPion (WELLBUTRIN SR) 12 hr tablet 150 mg  150 mg Oral Daily [name], [name], NP   150 mg at 04/11/17 0811  . clonazePAM (KLONOPIN) tablet 0.5 mg  0.5 mg Oral BID [name], [name], MD      . cyclobenzaprine (FLEXERIL) tablet 5 mg  5 mg Oral Q6H PRN [name], [name], NP   5 mg at 04/11/17 1007  . gabapentin (NEURONTIN) capsule 800 mg  800 mg Oral TID Lu Duffel, [name], NP   800 mg at 04/11/17 1206  . hydrOXYzine (ATARAX/VISTARIL) tablet 50 mg  50 mg Oral TID PRN [name], [name], MD      . magnesium hydroxide (MILK OF MAGNESIA) suspension 30 mL  30 mL Oral Daily PRN [name], [name], NP      . metroNIDAZOLE (FLAGYL) tablet 500 mg  500 mg Oral Q8H [name], [name], MD   500 mg at 04/11/17 1414  . naproxen (NAPROSYN) tablet 500 mg  500 mg Oral BID [name], [name], NP   500 mg at 04/11/17 0812  . nicotine (NICODERM CQ - dosed in mg/24 hours) patch 21 mg  21 mg Transdermal Daily [name], [name], NP   21 mg at 04/11/17 4627  . ondansetron (ZOFRAN) tablet 4 mg  4 mg Oral Q8H PRN [name], [name], NP      . risperiDONE (RISPERDAL) tablet 1 mg  1 mg Oral QHS [name], [name], MD       PTA Medications: Medications Prior to Admission  Medication Sig Dispense Refill Last Dose  . beclomethasone (QVAR) 40 MCG/ACT inhaler Inhale 1 puff into the lungs 2 (two) times daily as needed (asthma).    unk  . buPROPion (WELLBUTRIN SR) 150 MG 12 hr tablet Take 150 mg by mouth daily.    Past Week at Unknown time  . clonazePAM (KLONOPIN) 1 MG tablet Take 1 mg by mouth 4 (four) times daily as needed for anxiety.   0 04/09/2017 at Unknown time  . cyclobenzaprine (FLEXERIL) 10 MG tablet Take 5 mg by mouth every 6 (six) hours as needed for muscle spasms.   2 04/09/2017 at Unknown time  . gabapentin (NEURONTIN) 600 MG tablet Take 600 mg by mouth 3 (three) times daily.   Past Week at Unknown time  . HYDROcodone-acetaminophen (NORCO) 10-325 MG per tablet Take 1 tablet by mouth 2 (two) times daily as needed for moderate pain or severe pain.   0 04/09/2017 at 1200  . ketorolac (TORADOL) 10 MG tablet Take 1 tablet (10 mg total) by mouth every 6 (six) hours as needed. (Patient not taking: Reported on 04/09/2017) 30 tablet 6 Not Taking at Unknown time  .  metroNIDAZOLE (FLAGYL) 500 MG tablet Take 500 mg by mouth 2 (two) times daily.  0 04/09/2017 at 1200  . naproxen (NAPROSYN) 500 MG tablet Take 1 tablet (500 mg total) by mouth 2 (two) times daily. (Patient not taking: Reported on 04/09/2017) 30 tablet 0 Not Taking at Unknown time  . ondansetron (ZOFRAN ODT) 4 MG disintegrating tablet Take 1 tablet (4 mg total) by mouth every 8 (eight) hours as needed for nausea or vomiting. (Patient not taking: Reported on 04/09/2017) 20 tablet 0 Completed Course at Unknown time  . promethazine (PHENERGAN) 25 MG tablet Take 25 mg by mouth 3 (three) times daily as needed for nausea or vomiting.   0 04/09/2017 at Unknown time  . SUMAtriptan (IMITREX) 100 MG tablet Take 1 tab at onset of migraine.  May repeat in 2 hrs, if needed.  Max dose: 2 tabs/day. This is a 30 day prescription. 36 tablet 3 04/09/2017 at Unknown time  .  SUMAtriptan 6 MG/0.5ML SOAJ Inject 60m at onset of migraine. May repeat in 2 hours, if needed.  Max of 2 injections in 24 hours. (Patient not taking: Reported on 04/09/2017) 36 Cartridge 3 Not Taking at Unknown time  . traMADol (ULTRAM) 50 MG tablet Take 50 mg by mouth 2 (two) times daily as needed for moderate pain or severe pain.    Past Week at Unknown time  . VENTOLIN HFA 108 (90 BASE) MCG/ACT inhaler INHALE 2 PUFFS EVERY 4-6HRS AS NEEDED FOR COUGH/WHEEZE MAY USE 2PUFFS 10-20MINS PRIOR TO EXERCISE 18 Inhaler 0 unk    Patient Stressors: Marital or family conflict Medication change or noncompliance  Patient Strengths: Average or above average intelligence Capable of independent living Communication skills Motivation for treatment/growth Supportive family/friends  Treatment Modalities: Medication Management, Group therapy, Case management,  1 to 1 session with clinician, Psychoeducation, Recreational therapy.   Physician Treatment Plan for Primary Diagnosis: Borderline personality disorder ([place] Long Term Goal(s): Improvement in symptoms so as ready for discharge Improvement in symptoms so as ready for discharge   Short Term Goals: Ability to identify changes in lifestyle to reduce recurrence of condition will improve Ability to verbalize feelings will improve Ability to disclose and discuss suicidal ideas Ability to demonstrate self-control will improve Ability to identify and develop effective coping behaviors will improve Ability to maintain clinical measurements within normal limits will improve Compliance with prescribed medications will improve Ability to identify triggers associated with substance abuse/mental health issues will improve Ability to identify changes in lifestyle to reduce recurrence of condition will improve Ability to verbalize feelings will improve Ability to disclose and discuss suicidal ideas Ability to demonstrate self-control will improve Ability to  identify and develop effective coping behaviors will improve Ability to maintain clinical measurements within normal limits will improve Compliance with prescribed medications will improve Ability to identify triggers associated with substance abuse/mental health issues will improve  Medication Management: Evaluate patient's response, side effects, and tolerance of medication regimen.  Therapeutic Interventions: 1 to 1 sessions, Unit Group sessions and Medication administration.  Evaluation of Outcomes: Not Met  Physician Treatment Plan for Secondary Diagnosis: Principal Problem:   Borderline personality disorder ([place] Active Problems:   Bipolar disorder ([place]  Long Term Goal(s): Improvement in symptoms so as ready for discharge Improvement in symptoms so as ready for discharge   Short Term Goals: Ability to identify changes in lifestyle to reduce recurrence of condition will improve Ability to verbalize feelings will improve Ability to disclose and discuss suicidal ideas Ability to  demonstrate self-control will improve Ability to identify and develop effective coping behaviors will improve Ability to maintain clinical measurements within normal limits will improve Compliance with prescribed medications will improve Ability to identify triggers associated with substance abuse/mental health issues will improve Ability to identify changes in lifestyle to reduce recurrence of condition will improve Ability to verbalize feelings will improve Ability to disclose and discuss suicidal ideas Ability to demonstrate self-control will improve Ability to identify and develop effective coping behaviors will improve Ability to maintain clinical measurements within normal limits will improve Compliance with prescribed medications will improve Ability to identify triggers associated with substance abuse/mental health issues will improve     Medication Management: Evaluate patient's response, side  effects, and tolerance of medication regimen.  Therapeutic Interventions: 1 to 1 sessions, Unit Group sessions and Medication administration.  Evaluation of Outcomes: Not Met   RN Treatment Plan for Primary Diagnosis: Borderline personality disorder ([place]) Long Term Goal(s): Knowledge of disease and therapeutic regimen to maintain health will improve  Short Term Goals: Ability to remain free from injury will improve, Ability to identify and develop effective coping behaviors will improve and Compliance with prescribed medications will improve  Medication Management: RN will administer medications as ordered by provider, will assess and evaluate patient's response and provide education to patient for prescribed medication. RN will report any adverse and/or side effects to prescribing provider.  Therapeutic Interventions: 1 on 1 counseling sessions, Psychoeducation, Medication administration, Evaluate responses to treatment, Monitor vital signs and CBGs as ordered, Perform/monitor CIWA, COWS, AIMS and Fall Risk screenings as ordered, Perform wound care treatments as ordered.  Evaluation of Outcomes: Not Met   LCSW Treatment Plan for Primary Diagnosis: Borderline personality disorder ([place]) Long Term Goal(s): Safe transition to appropriate next level of care at discharge, Engage patient in therapeutic group addressing interpersonal concerns.  Short Term Goals: Engage patient in aftercare planning with referrals and resources, Increase social support and Increase skills for wellness and recovery  Therapeutic Interventions: Assess for all discharge needs, 1 to 1 time with Social worker, Explore available resources and support systems, Assess for adequacy in community support network, Educate family and significant other(s) on suicide prevention, Complete Psychosocial Assessment, Interpersonal group therapy.  Evaluation of Outcomes: Not Met   Progress in Treatment: Attending groups:  Yes. Participating in groups: Yes. Taking medication as prescribed: Yes. Toleration medication: Yes. Family/Significant other contact made: No, will contact:  when given permission Patient understands diagnosis: Yes. Discussing patient identified problems/goals with staff: Yes. Medical problems stabilized or resolved: Yes. Denies suicidal/homicidal ideation: Yes. Issues/concerns per patient self-inventory: No. Other: none  New problem(s) identified: No, Describe:  none  New Short Term/Long Term Goal(s): Pt goal: to learn better coping skills for anxiety besides cutting.  Discharge Plan or Barriers:   Reason for Continuation of Hospitalization: Depression Medication stabilization  Estimated Length of Stay: 3-5 days.  Attendees: Patient:Carmen Daniels 04/11/2017   Physician: Dr Sanjuana Letters, MD 04/11/2017   Nursing: Mayra Neer, RN 04/11/2017   RN Care Manager: 04/11/2017   Social Worker: Lurline Idol, LCSW 04/11/2017   Recreational Therapist:  04/11/2017   Other:  04/11/2017   Other:  04/11/2017   Other: 04/11/2017        Scribe for Treatment Team: Joanne Chars, [place] 04/11/2017 2:54 PM

## 2017-04-11 NOTE — BHH Group Notes (Signed)
Group was facilitated and Probation officer discussed rules and regulations of the unit, explained the schedule for the rest of the evening(phone times, med times, vitals and breakfast in the am.) Writer ask each patient how their day was on a scale of 1-10. Pt stated her day was a 9. Pt stated she had a much better day but was still upset with parents for bringing her here.Pt stated she spoke with MD, meds adjusted and RN's were really nice. Writer spoke with patients about taking care of themselves and replacing negative thoughts with positive thoughts. Writer encourage patients to write down their accomplishments and celebrate them as they complete each one no matter how big or small. Writer spoke about setting boundaries and not feeling guilty for doing what is best for them.

## 2017-04-11 NOTE — Progress Notes (Signed)
Recreation Therapy Notes  Date: 04/10/17 Time: 0930 Location: [address] Dayroom  Group Topic: Stress Management  Goal Area(s) Addresses:  Patient will verbalize importance of using healthy stress management.  Patient will identify positive emotions associated with healthy stress management.   Intervention: Stress Management  Activity :  [employer].  LRT played a meditation on the power of mountains to be resilient in the face of change.  Patients were to follow along with the meditation as it played.  Education: Stress Management, Discharge Planning.   Education Outcome: Acknowledges edcuation/In group clarification offered/Needs additional education  Clinical Observations/Feedback: Pt did not attend group.     Victorino Sparrow, LRT/CTRS         Ria Comment, Ryen Heitmeyer A 04/11/2017 12:12 PM

## 2017-04-11 NOTE — BHH Suicide Risk Assessment (Signed)
[facility] Admission Suicide Risk Assessment   Nursing information obtained from:    Demographic factors:    Current Mental Status:    Loss Factors:    Historical Factors:    Risk Reduction Factors:     Total Time spent with patient: 45 minutes Principal Problem: Borderline personality disorder ([place]) Diagnosis:   Patient Active Problem List   Diagnosis Date Noted  . Borderline personality disorder ([place]) [F60.3] 04/11/2017  . Bipolar I disorder, current or most recent episode manic, with psychotic features ([place]) [F31.2] 04/10/2017  . Bipolar disorder ([place]) [F31.9] 04/10/2017  . Abnormal MRI of head [R93.0] 04/21/2016  . Chronic migraine without aura [G43.709] 03/27/2015  . Mild persistent asthma [J45.30] 12/30/2014  . Allergic rhinitis due to pollen [J30.1] 12/30/2014  . 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 Nasal polyposis [J33.9] 12/30/2014   Subjective Data:  32 y.o Caucasian female, divorced but still lives with her ex-husband, unemployed, on [place]. Background history of Bipolar Disorder Unspecified, [employer] and chronic pain. Presented to the ER via the police. Her family involuntarily committed her. She inflicted multiple lacerations to own forearm while at her house. She just had an argument with her ex-husband. She is reported to be having a lot of conflict with her ex-husband. Reported to be aggressive towards him. Patient has been off her medications since December. She reported seeing shadows. She reported hearing voices that make derogatory comments about her. Patient had 32 stitches.  Routine labs are WNL. Toxicology is negative. UDS is positive for opiates and benzo's. No alcohol. marked history of cutting and impulsivity. Patient is emotionally unstable. No past suicidal behavior, no family history of suicide, no evidence of psychosis. No evidence of mania. No cognitive impairment. No access to weapons. She is cooperative with care. She has agreed to treatment  recommendations. She has agreed to communicate suicidal thoughts to staff if the thoughts becomes overwhelming.       Continued Clinical Symptoms:    The "Alcohol Use Disorders Identification Test", Guidelines for Use in Primary Care, Second Edition.  World Pharmacologist [facility]). Score between 0-7:  no or low risk or alcohol related problems. Score between 8-15:  moderate risk of alcohol related problems. Score between 16-19:  high risk of alcohol related problems. Score 20 or above:  warrants further diagnostic evaluation for alcohol dependence and treatment.   CLINICAL FACTORS:   Personality Disorders:   Cluster B   Musculoskeletal: Strength & Muscle Tone: within normal limits Gait & Station: normal Patient leans: N/A  Psychiatric Specialty Exam: Physical Exam  ROS  Blood pressure 134/87, pulse 97, temperature 98.1 F (36.7 C), temperature source Oral, resp. rate 18, last menstrual period 03/19/2017, SpO2 100 %.There is no height or weight on file to calculate BMI.  General Appearance: As in H&P  Eye Contact:    Speech:    Volume:    Mood:    Affect:    Thought Process:    Orientation:    Thought Content:    Suicidal Thoughts:    Homicidal Thoughts:  As in H&P  Memory:    Judgement:    Insight:    Psychomotor Activity:    Concentration:    Recall:    Fund of Knowledge:    Language:    Akathisia:    Handed:    AIMS (if indicated):     Assets:    ADL's:    Cognition:  As in H&P  Sleep:  Number of Hours: 5.75  COGNITIVE FEATURES THAT CONTRIBUTE TO RISK:  Thought constriction (tunnel vision)    SUICIDE RISK:   Moderate:  Frequent suicidal ideation with limited intensity, and duration, some specificity in terms of plans, no associated intent, good self-control, limited dysphoria/symptomatology, some risk factors present, and identifiable protective factors, including available and accessible social support.  PLAN OF CARE:  As in H&P  I certify  that inpatient services furnished can reasonably be expected to improve the patient's condition.   [place], MD 04/11/2017, 11:32 AM

## 2017-04-12 DIAGNOSIS — F39 Unspecified mood [affective] disorder: Secondary | ICD-10-CM

## 2017-04-12 DIAGNOSIS — F419 Anxiety disorder, unspecified: Secondary | ICD-10-CM

## 2017-04-12 DIAGNOSIS — Z560 Unemployment, unspecified: Secondary | ICD-10-CM

## 2017-04-12 MED ORDER — TRAMADOL HCL 50 MG PO TABS
50.0000 mg | ORAL_TABLET | Freq: Once | ORAL | Status: AC
  Administered 2017-04-12: 50 mg via ORAL
  Filled 2017-04-12: qty 1

## 2017-04-12 MED ORDER — NICOTINE 21 MG/24HR TD PT24
21.0000 mg | MEDICATED_PATCH | Freq: Every day | TRANSDERMAL | Status: DC
  Administered 2017-04-12 – 2017-04-13 (×2): 21 mg via TRANSDERMAL
  Filled 2017-04-12 (×4): qty 1

## 2017-04-12 MED ORDER — CLONAZEPAM 0.5 MG PO TABS
0.5000 mg | ORAL_TABLET | Freq: Two times a day (BID) | ORAL | Status: DC | PRN
  Administered 2017-04-12 – 2017-04-13 (×4): 0.5 mg via ORAL
  Filled 2017-04-12 (×4): qty 1

## 2017-04-12 MED ORDER — TRAZODONE HCL 50 MG PO TABS
50.0000 mg | ORAL_TABLET | Freq: Every evening | ORAL | Status: DC | PRN
  Administered 2017-04-12 – 2017-04-14 (×5): 50 mg via ORAL
  Filled 2017-04-12 (×4): qty 1

## 2017-04-12 NOTE — Progress Notes (Signed)
Adult Psychoeducational Group Note  Date:  04/12/2017 Time:  1615 Group Topic/Focus:  Overcoming Stress:   The focus of this group is to define stress and help patients assess their triggers.  Participation Level:  Minimal  Participation Quality:  Inattentive  Affect:  Resistant  Cognitive:  Disorganized  Insight: Limited  Engagement in Group:  Engaged  Modes of Intervention:  Discussion, Education and Support  Additional Comments:  Group focus on grounding exercises  Carmen Daniels 04/12/2017, 5:05 PM
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
## 2017-04-12 NOTE — Progress Notes (Signed)
Adult Psychoeducational Group Note  Date:  04/12/2017 Time:  1515 Group Topic/Focus:  Relaxation activities.   The purpose of this group is to help patients identify activities for coping with anxiety and depression.   Participation Level:  Active  Participation Quality:  Appropriate  Affect:  Appropriate  Cognitive:  Appropriate  Insight: Appropriate  Engagement in Group:  Engaged  Modes of Intervention:  Activity  Additional Comments:    Ghina Bittinger L 04/12/2017

## 2017-04-12 NOTE — BHH Suicide Risk Assessment (Signed)
[place] INPATIENT:  Family/Significant Other Suicide Prevention Education  Suicide Prevention Education:  Education Completed; Arlie Solomons, mother, [phone], has been identified by the patient as the family member/significant other with whom the patient will be residing, and identified as the person(s) who will aid the patient in the event of a mental health crisis (suicidal ideations/suicide attempt).  With written consent from the patient, the family member/significant other has been provided the following suicide prevention education, prior to the and/or following the discharge of the patient.  The suicide prevention education provided includes the following:  Suicide risk factors  Suicide prevention and interventions  National Suicide Hotline telephone number  [facility] assessment telephone number  [place] Day Emergency Assistance [place] and/or Residential Mobile Crisis Unit telephone number  Request made of family/significant other to:  Remove weapons (e.g., guns, rifles, knives), all items previously/currently identified as safety concern.  Manuela Schwartz reports pt does not have any guns that she is aware of.  Remove drugs/medications (over-the-counter, prescriptions, illicit drugs), all items previously/currently identified as a safety concern.  The family member/significant other verbalizes understanding of the suicide prevention education information provided.  The family member/significant other agrees to remove the items of safety concern listed above.  Manuela Schwartz reports that she and her husband are very concerned about pt due to both mental health and substance use issues.  Pt has multiple MDs that she goes to and is able to get opiates, tramadol, gabapentin, which they believe she is abusing.  They are also concerned because she does have migraine headaches that cause problems regularly.  They would like to arrange some sort of residential program--CSW  talked with them about [facility].  She was at an IOP in [facility] before but she already contacted them and they cannot talk [facility].  Joanne Chars. LCSW 04/12/2017, 2:37 PM

## 2017-04-12 NOTE — Progress Notes (Signed)
Adult Psychoeducational Group Note  Date:  04/12/2017 Time:  10:09 PM  Group Topic/Focus:  Wrap-Up Group:   The focus of this group is to help patients review their daily goal of treatment and discuss progress on daily workbooks.  Participation Level:  Active  Participation Quality:  Appropriate  Affect:  Appropriate  Cognitive:  Alert  Insight: Appropriate  Engagement in Group:  Engaged  Modes of Intervention:  Discussion  Additional Comments:  Patient stated having a good day. Patient's goal for today was to try to think of coping skills for anxiety.   [name] 04/12/2017, 10:09 PM

## 2017-04-12 NOTE — Progress Notes (Signed)
D: Patient observed up and restless on the hall. Near constant complaints which are mostly somatic in nature. Frequently requesting tramadol (not on chart), klonopin, ice packs for L arm wound, dressing changes, new socks, that her temp be taken, etc. Patient staff splits and also states, "the doctor told me to tell you that you can give me the tramadol. I just talked to him. You probably saw me." Patient complaining of bilat swelling in hands and feet and R foot does look somewhat puffy however no pitting edema present. Patient states her pain is unbearable at a 10/10 however patient observed socializing at ease with peers in the dayroom. Asked for tramaol all day and close to dinner time came to NS and said, "my pain is so bad I just threw up" (not observed by staff. Patient's affect flat, mood preoccupied, somatically focused. Patient's speech slurred at times, memory and judgement limited-poor. Patient requires information be repeated several times. "I know I have trouble remembering. I've been that way for awhile." Per self inventory and discussions with writer, rates depression at a 0/10, hopelessness at a 0/10 and anxiety at an 8/10. Rates sleep as good, appetite as good, energy as normal and concentration as good.  States goal for today is to "get my personal belongings from my mom."   A: Medicated per orders, prn klonopin, maalox and one time tramadol given for complaints. Level III obs in place for safety. Emotional support offered and self inventory reviewed. Encouraged completion of Suicide Safety Plan and programming participation. Discussed POC with MD, SW.  Fall prevention plan in place and reviewed with patient as pt is a high fall risk due to hx of frequent falls prior to admit.   R: Patient verbalizes understanding of POC, falls prevention education. On reassess, patient reports decrease in anxiety and pain rated at a 7/10. Patient denies SI/HI/AVH and remains safe on level III obs. Will  continue to monitor closely and make verbal contact frequently.

## 2017-04-12 NOTE — Progress Notes (Signed)
Adult Psychoeducational Group Note  Date:  04/12/2017 Time:  0830 Group Topic/Focus:  Goals Group:   The focus of this group is to help patients establish daily goals to achieve during treatment and discuss how the patient can incorporate goal setting into their daily lives to aide in recovery.  Participation Level:  Active  Participation Quality:  Appropriate and Sharing  Affect:  Appropriate  Cognitive:  Appropriate  Insight: Appropriate  Engagement in Group:  Engaged  Modes of Intervention:  Discussion and Orientation  Additional Comments:    [name] 04/12/2017, 10:37 AM

## 2017-04-12 NOTE — BHH Group Notes (Signed)
[place] LCSW Group Therapy Note  Date/Time: 04/12/17, 1315  Type of Therapy/Topic:  Group Therapy:  Feelings about Diagnosis  Participation Level:  Active   Mood:pleasant   Description of Group:    This group will allow patients to explore their thoughts and feelings about diagnoses they have received. Patients will be guided to explore their level of understanding and acceptance of these diagnoses. Facilitator will encourage patients to process their thoughts and feelings about the reactions of others to their diagnosis, and will guide patients in identifying ways to discuss their diagnosis with significant others in their lives. This group will be process-oriented, with patients participating in exploration of their own experiences as well as giving and receiving support and challenge from other group members.   Therapeutic Goals: 1. Patient will demonstrate understanding of diagnosis as evidence by identifying two or more symptoms of the disorder:  2. Patient will be able to express two feelings regarding the diagnosis 3. Patient will demonstrate ability to communicate their needs through discussion and/or role plays  Summary of Patient Progress:Pt very active in group discussion today regarding diagnoses, symptoms, and mental health stigma.  Good participation.          Therapeutic Modalities:   Cognitive Behavioral Therapy Brief Therapy Feelings Identification   Lurline Idol, LCSW

## 2017-04-12 NOTE — Progress Notes (Signed)
Recreation Therapy Notes  Date: 2.19.19 Time: 2:45 pm  Location: [address]   AAA/T Program Assumption of Risk Form signed by Patient/ or Parent Legal Guardian Yes  Patient is free of allergies or sever asthma Yes  Patient reports no fear of animals Yes  Patient reports no history of cruelty to animals Yes  Patient understands his/her participation is voluntary Yes  Patient washes hands before animal contact Yes  Patient washes hands after animal contact Yes  Behavioral Response: Engaged   Education:Hand Washing, Appropriate Animal Interaction   Education Outcome: Acknowledges education.   Clinical Observations/Feedback: Patient attended session and interacted appropriately with therapy dog and peers. Patient asked appropriate questions about therapy dog and his training. Patient shared stories about their pets at home with group.   Ranell Patrick, Recreation Therapy Intern   Ranell Patrick 04/12/2017 3:07 PM

## 2017-04-12 NOTE — Plan of Care (Signed)
Patient displays limited judgement, mild confusion. Repeats conversations frequently.  Patient verbalizes understanding of information, education provided.

## 2017-04-12 NOTE — Progress Notes (Signed)
D: Pt denies SI/HI/AV hallucinations.  Pt goal for today is to work on staying positive and coping skills. A: Pt was offered support and encouragement. Pt was given scheduled medications. Pt was encourage to attend groups. Q 15 minute checks were done for safety.  R:Pt attends groups and interacts well with peers and staff. Pt is taking medication. Pt has no complaints.Pt receptive to treatment and safety maintained on unit.

## 2017-04-12 NOTE — Plan of Care (Signed)
Patient denies SI/HI. Patient remains free of self harm and injury. Monitoring continues.

## 2017-04-12 NOTE — Progress Notes (Signed)
  DATA ACTION RESPONSE  Objective- Pt. is visible in the dayroom, seen interacting with peers and playing cards.Presents with a labile affect and mood. Pt appears somatically focused with medication seeking behaviors. Pt had a plethora of c/o's this evening. Provider on call notified and new orders obtained. See MAR. Left forearm suture site cleanse and dressed per Pt request.  Subjective- Denies having any SI/HI/AVH/Pain at this time.Is cooperative and remains safe on the unit.  1:1 interaction in private to establish rapport. Encouragement, education, & support given from staff.  PRN vistaril, trazodone, and klonopin requested and will re-eval accordingly.   Safety maintained with Q 15 checks. Continue with POC.

## 2017-04-12 NOTE — Progress Notes (Signed)
[facility] MD Progress Note  04/12/2017 10:28 AM Carmen Daniels  MRN:  [id number] Subjective:  Patient reports she is feeling better . Denies suicidal or self injurious ideations at this time. Presents with somatic concerns, and reports history of chronic pain, migraines, back pain.  Currently does not endorse medication side effects. Objective: I have discussed case with treatment team and have met with patient . 32 year old female, lives with husband. Admitted under IVC following an episode of severe self cutting which required multiple staples . States this was impulsive, unplanned and not suicidal in nature , and triggered by an argument with her husband. Reports long history of anxiety, brief mood swings,mood instability, states she has been diagnosed with Bipolar Disorder in the past. Reports history of multiple prior instances of self cutting, but states she had not engaged in self injurious behaviors for a long period of time until recently. Visible on unit, going to groups. Denies medication side effects at this time. Denies suicidal ideations or self injurious ideations at this time. Reports she has a long history of being on BZDs for anxiety and opiates for pain. States she had stopped these and all other medications several days prior to admission. Admission UDS (+) for BZDs and Opiates ). Currently not presenting with any symptoms of WDL- no tremors, no diaphoresis, no psychomotor restlessness, vitals stable.  Principal Problem: Borderline personality disorder ([place]) Diagnosis:   Patient Active Problem List   Diagnosis Date Noted  . Borderline personality disorder ([place]) [F60.3] 04/11/2017  . Bipolar I disorder, current or most recent episode manic, with psychotic features ([place]) [F31.2] 04/10/2017  . Bipolar disorder ([place]) [F31.9] 04/10/2017  . Abnormal MRI of head [R93.0] 04/21/2016  . Chronic migraine without aura [G43.709] 03/27/2015  . Mild persistent asthma [J45.30] 12/30/2014  .  Allergic rhinitis due to pollen [J30.1] 12/30/2014  . 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 Nasal polyposis [J33.9] 12/30/2014   Total Time spent with patient: 20 minutes  Past Medical History:  Past Medical History:  Diagnosis Date  . Allergic rhinitis   . Anxiety   . Asthma   . Back pain   . Bipolar disorder ([place])   . Chronic pain   . Migraines   . Nasal polyps   . Nausea   . Neck pain   . OCD (obsessive compulsive disorder)     Past Surgical History:  Procedure Laterality Date  . NASAL SINUS SURGERY    . WISDOM TOOTH EXTRACTION     Family History:   Social History:  Social History   Substance and Sexual Activity  Alcohol Use Yes  . Alcohol/week: 0.0 oz   Comment: Occasional alcohol use     Social History   Substance and Sexual Activity  Drug Use No    Social History   Socioeconomic History  . Marital status: Married    Spouse name: None  . Number of children: 0  . Years of education: 23  . Highest education level: None  Social Needs  . Financial resource strain: None  . Food insecurity - worry: None  . Food insecurity - inability: None  . Transportation needs - medical: None  . Transportation needs - non-medical: None  Occupational History  . Occupation: Unemployed  Tobacco Use  . Smoking status: Current Every Day Smoker    Packs/day: 0.50    Types: Cigarettes  . Smokeless tobacco: Never Used  Substance and Sexual Activity  . Alcohol use: Yes    Alcohol/week:  0.0 oz    Comment: Occasional alcohol use  . Drug use: No  . Sexual activity: Yes    Birth control/protection: Condom  Other Topics Concern  . None  Social History Narrative   Lives at home alone.   Right-handed.   3 glasses of green tea per day.   Additional Social History:   Sleep: improving   Appetite:  Fair  Current Medications: Current Facility-Administered Medications  Medication Dose Route Frequency Provider Last Rate Last Dose  . albuterol (PROVENTIL  HFA;VENTOLIN HFA) 108 (90 Base) MCG/ACT inhaler 1-2 puff  1-2 puff Inhalation Q4H PRN [name], [name], NP      . alum & mag hydroxide-simeth (MAALOX/MYLANTA) 200-200-20 MG/5ML suspension 30 mL  30 mL Oral Q6H PRN [name], [name], NP   30 mL at 04/12/17 0953  . beclomethasone (QVAR) 40 MCG/ACT inhaler 1 puff  1 puff Inhalation BID PRN [name], [name], NP      . buPROPion (WELLBUTRIN SR) 12 hr tablet 150 mg  150 mg Oral Daily [name], [name], NP   150 mg at 04/12/17 0751  . cyclobenzaprine (FLEXERIL) tablet 5 mg  5 mg Oral Q6H PRN [name], [name], NP   5 mg at 04/11/17 1007  . gabapentin (NEURONTIN) capsule 800 mg  800 mg Oral TID Lu Duffel, [name], NP   800 mg at 04/12/17 0751  . hydrOXYzine (ATARAX/VISTARIL) tablet 50 mg  50 mg Oral TID PRN [name], [name], MD      . magnesium hydroxide (MILK OF MAGNESIA) suspension 30 mL  30 mL Oral Daily PRN [name], [name], NP      . metroNIDAZOLE (FLAGYL) tablet 500 mg  500 mg Oral Q8H [name], [name], MD   500 mg at 04/12/17 0643  . naproxen (NAPROSYN) tablet 500 mg  500 mg Oral BID [name], [name], NP   500 mg at 04/12/17 0751  . nicotine (NICODERM CQ - dosed in mg/24 hours) patch 21 mg  21 mg Transdermal Daily Yan Pankratz A, MD      . ondansetron (ZOFRAN) tablet 4 mg  4 mg Oral Q8H PRN [name], [name], NP      . risperiDONE (RISPERDAL) tablet 1 mg  1 mg Oral QHS [name], [name], MD   1 mg at 04/11/17 2137    Lab Results: No results found for this or any previous visit (from the past 48 hour(s)).  Blood Alcohol level:  Lab Results  Component Value Date   ETH <10 04/09/2017   ETH <5 84/69/6295    Metabolic Disorder Labs: No results found for: HGBA1C, MPG No results found for: PROLACTIN No results found for: CHOL, TRIG, HDL, CHOLHDL, VLDL, LDLCALC  Physical Findings: AIMS: Facial and Oral Movements Muscles of Facial Expression: None, normal Lips and Perioral Area: None, normal Jaw: None, normal Tongue:  None, normal,Extremity Movements Upper (arms, wrists, hands, fingers): None, normal Lower (legs, knees, ankles, toes): None, normal, Trunk Movements Neck, shoulders, hips: None, normal, Overall Severity Severity of abnormal movements (highest score from questions above): None, normal Incapacitation due to abnormal movements: None, normal Patient's awareness of abnormal movements (rate only patient's report): No Awareness, Dental Status Current problems with teeth and/or dentures?: No Does patient usually wear dentures?: No  CIWA:  CIWA-Ar Total: 1 COWS:  COWS Total Score: 2  Musculoskeletal: Strength & Muscle Tone: within normal limits no tremors, no diaphoresis, no psychomotor restlessness  Gait & Station: normal Patient leans: N/A  Psychiatric Specialty Exam:  Physical Exam  ROS reports history of headaches, chronic pain. No bleeding or exudate from self inflicted forearm wound . No fever, no chills   Blood pressure 132/70, pulse 68, temperature 98.1 F (36.7 C), temperature source Oral, resp. rate 18, last menstrual period 03/19/2017, SpO2 100 %.There is no height or weight on file to calculate BMI.  General Appearance: Fairly Groomed  Eye Contact:  Good  Speech:  Normal Rate  Volume:  Normal  Mood:  reports she is feeling better, minimizes depression at this time  Affect:  anxious, but tends to improve during session  Thought Process:  Linear and Descriptions of Associations: Intact  Orientation:  Other:  fully  alert and attentive   Thought Content:  denies hallucinations, no delusions, not internally preoccupied   Suicidal Thoughts:  No denies suicidal ideations, denies self injurious ideations, contracts for safety on unit, denies HI  Homicidal Thoughts:  No  Memory:  recent and remote grossly intact   Judgement:  Fair  Insight:  Fair  Psychomotor Activity:  Normal- no restlessness or psychomotor agitation   Concentration:  Concentration: Good and Attention Span: Good   Recall:  Good  Fund of Knowledge:  Good  Language:  Good  Akathisia:  Negative  Handed:  Right  AIMS (if indicated):     Assets:  Communication Skills Desire for Improvement Resilience  ADL's:  Intact  Cognition:  WNL  Sleep:  Number of Hours: 6   Assessment - 32 year old female, status post self inflicted cut to forearm, which required multiple staples. Reports history of self cutting, and states intention was not suicidal/ event triggered by martial argument. At this time minimizes depression, denies SI,  presents anxious, medication focused . No current opiate or BZD withdrawal symptoms noted or reported and vitals stable .   Treatment Plan Summary: Daily contact with patient to assess and evaluate symptoms and progress in treatment, Medication management, Plan inpatient treatment  and medications as below Encourage group and milieu participation to work on coping skills and symptom reduction Treatment team working on disposition planning options  Continue Risperidone 1 mgr QHS for mood disorder , psychosis Continue Wellbutrin SR 150 mgrs QAM for depression Continue Neurontin 800 mgrs TID for anxiety, pain   Jenne Campus, MD 04/12/2017, 10:28 AM

## 2017-04-13 DIAGNOSIS — R0981 Nasal congestion: Secondary | ICD-10-CM

## 2017-04-13 DIAGNOSIS — G4700 Insomnia, unspecified: Secondary | ICD-10-CM

## 2017-04-13 DIAGNOSIS — R450 Nervousness: Secondary | ICD-10-CM

## 2017-04-13 MED ORDER — BUPROPION HCL ER (XL) 150 MG PO TB24
150.0000 mg | ORAL_TABLET | Freq: Every day | ORAL | Status: DC
  Administered 2017-04-14 – 2017-04-15 (×2): 150 mg via ORAL
  Filled 2017-04-13 (×4): qty 1

## 2017-04-13 MED ORDER — QUETIAPINE FUMARATE 50 MG PO TABS
50.0000 mg | ORAL_TABLET | Freq: Every evening | ORAL | Status: DC | PRN
  Administered 2017-04-13 – 2017-04-14 (×2): 50 mg via ORAL
  Filled 2017-04-13 (×6): qty 1

## 2017-04-13 MED ORDER — NICOTINE POLACRILEX 2 MG MT GUM
2.0000 mg | CHEWING_GUM | OROMUCOSAL | Status: DC | PRN
  Administered 2017-04-13 – 2017-04-15 (×5): 2 mg via ORAL
  Filled 2017-04-13 (×2): qty 1

## 2017-04-13 MED ORDER — FLUTICASONE PROPIONATE 50 MCG/ACT NA SUSP
1.0000 | Freq: Every day | NASAL | Status: DC
  Administered 2017-04-13 – 2017-04-15 (×3): 1 via NASAL
  Filled 2017-04-13 (×2): qty 16

## 2017-04-13 MED ORDER — BUPROPION HCL ER (XL) 150 MG PO TB24: 150.0000 mg | ORAL_TABLET | Freq: Every day | ORAL | Status: DC

## 2017-04-13 MED ORDER — TRAMADOL HCL 50 MG PO TABS
50.0000 mg | ORAL_TABLET | Freq: Once | ORAL | Status: AC
  Administered 2017-04-13: 50 mg via ORAL
  Filled 2017-04-13: qty 1

## 2017-04-13 NOTE — Progress Notes (Addendum)
Patient ID: Carmen Daniels, female   DOB: 1985-04-26, 32 y.o.   MRN: [id number]  Pt currently presents with a flat affect and impulsive, hyperactive behavior. Speech is pressured and rapid. Pt main complaints are somatic, reports ongoing pain in her head, left arm and feet. Itching also reported in patients arm. Some swelling noted in her lower legs and feet bilaterally. Assesment finds non pitting edema, capillary refill less than 3 seconds. Pt reports pain, pt seen sitting on the floor in the hallway. Pt reports to Probation officer that their goal is to "go home after a phone conversation with my mom." Pt reports good sleep with current medication regimen.   Pt provided with scheduled and as needed medications per providers orders. Pt educated on pain management and medication alternatives, needs reinforcement. Pt encouraged to use ice to reduce symptoms in arm. Encouraged to seek council from PCP about swelling in lower extremities, elevate legs currently and lower salt intake with meals. Pt's labs and vitals were monitored throughout the night. Pt given a 1:1 about emotional and mental status. Pt supported and encouraged to express concerns and questions. Pt educated on medication Vistaril and Nicorette gum. Pt requests to take a bath and shave, pt allowed while MHT monitors. Writer applies dry bandage to wound on arm. No erythema or drainage noted.   Pt's safety ensured with 15 minute and environmental checks. Pt currently denies SI/HI and A/V hallucinations. Pt verbally agrees to seek staff if SI/HI or A/VH occurs and to consult with staff before acting on any harmful thoughts. Needs interventions often to help maintain control. Pt is currently verbally redirectable. Will continue POC.

## 2017-04-13 NOTE — Progress Notes (Signed)
D: Patient observed up and social in the dayroom. Patient states her L arm pain is excruciating and her anxiety is "through the roof" however behavior is incongruent with these statements. Patient continues to have very frequent needs of staff. Somatically focused. Also continues to ask for tramadol as she states the scheduled naproxen is "not touching the pain." Patient's affect animated, mood pleasant but becomes irritable if needs are not immediately met. Per self inventory and discussions with writer, rates depression at a 0/10, hopelessness at a 0/10 and anxiety at a 10/10 "always." Rates sleep as good, appetite as good, energy as normal and concentration as good.  States goal for today is to "not listen to my partner when he tells me I don't need my meds. Also work on better coping skills instead of cutting. Talk with my husband, Meditate. Breathing exercised."  Continues to state L arm pain is a 10/10 and states, "I don't know why I did it. I'm not suicidal and I never have been." Patient also complains of SOB and requests inhaler. At times complains of stomach discomfort. Somatic complaints are frequently vague and in contradiction as are her other statements.   A: Medicated per orders, prn IH, klonopin given for complaints. Level III obs in place for safety. Emotional support offered and self inventory reviewed. Encouraged completion of Suicide Safety Plan and programming participation. Discussed POC with MD, SW.  Fall prevention plan in place and reviewed with patient as pt is a high fall risk due to hx of falls PTA.   R: Patient verbalizes understanding of POC, falls prevention education. On reassess, patient states pain remains a 10/10 but anxiety and SOB did decrease. Patient denies SI/HI/AVH and remains safe on level III obs. Will continue to monitor closely and make verbal contact frequently.

## 2017-04-13 NOTE — B[facility] Group Notes (Signed)
[facility] Mental Health Association Group Therapy                                                             04/13/2017  2:55 PM  Type of Therapy: Mental Health Association Presentation  Participation Level: Active   Participation Quality: Attentive  Affect: Appropriate  Cognitive: Oriented  Insight: Developing/Improving  Engagement in Therapy: Engaged  Modes of Intervention: Discussion, Education and Socialization  Summary of Progress/Problems: Pateint was attentive and involved with the group  Verdis Frederickson, SW Intern

## 2017-04-13 NOTE — Progress Notes (Signed)
Recreation Therapy Notes  Date: 04/13/17 Time: 0930 Location: [address] Dayroom  Group Topic: Stress Management  Goal Area(s) Addresses:  Patient will verbalize importance of using healthy stress management.  Patient will identify positive emotions associated with healthy stress management.   Intervention: Stress Management  Activity :  [facility].  LRT introduced the stress management technique of guided imagery.  LRT read a script to allow patients to visualize the sights and sounds of being in the forest.  Patients were to follow along as the script was read to participate in activity.  Education: Stress Management, Discharge Planning.   Education Outcome: Acknowledges edcuation/In group clarification offered/Needs additional education  Clinical Observations/Feedback: Pt did not attend group.    Victorino Sparrow, LRT/CTRS         Ria Comment, Katherene Dinino A 04/13/2017 1:08 PM

## 2017-04-13 NOTE — Progress Notes (Signed)
Adult Psychoeducational Group Note  Date:  04/13/2017 Time:  0830 Group Topic/Focus:  Goals Group:   The focus of this group is to help patients establish daily goals to achieve during treatment and discuss how the patient can incorporate goal setting into their daily lives to aide in recovery.  Participation Level:  Active  Participation Quality:  Appropriate  Affect:  Appropriate  Cognitive:  Appropriate  Insight: Appropriate  Engagement in Group:  Engaged  Modes of Intervention:  Discussion and Orientation  Additional Comments:   Pt attended orientation/goal group Carmen Daniels 04/13/2017, 10:44 AM

## 2017-04-13 NOTE — Progress Notes (Signed)
Adult Psychoeducational Group Note  Date:  04/13/2017 Time:  10:30 PM  Group Topic/Focus:  Wrap-Up Group:   The focus of this group is to help patients review their daily goal of treatment and discuss progress on daily workbooks.  Participation Level:  Active  Participation Quality:  Appropriate  Affect:  Appropriate  Cognitive:  Alert  Insight: Appropriate  Engagement in Group:  Engaged  Modes of Intervention:  Discussion  Additional Comments:  Patient stated having a good day. Patient's goal for today was to think of a discharge plan. Patient stated she was having a parent meeting tomorrow with Dr. Parke Poisson.   Carmen Daniels [name] 04/13/2017, 10:30 PM

## 2017-04-13 NOTE — Progress Notes (Addendum)
[facility] MD Progress Note  04/13/2017 2:59 PM Carmen Daniels  MRN:  [id number]   Subjective:  Patient reports that she is doing good mentally. She only complains about the pain in her arm where she cut herself and she is requesting more pain medication , but states "I was already told no on the Percocet." She also reports that her anxiety is high and she wants to have more Klonopin or get her Xanax back. She reports that she plans to discharge to her house with her husband and they always have fights and she will kick him out and then he will come back. "Well my dad bought me the house, so it's not his house to stay in if I tell him to get out." She denies nay depression, or SI/HI/AVH and contracts for safety. She denies any medication side effects.   Objective: Patient's chart and findings reviewed and discussed with treatment team. Patient presents in the day room interacting with peers and staff appropriately. She has been attending groups.She has kept here wound on her left forearm covered. She will continue her current medications, except for the Wellbutrin and she will be changed to XL from SR. Patient appears to be psychiatrically cleared and should be discharged tomorrow, as she now uses her time here for medication seeking .   Principal Problem: Borderline personality disorder ([place]) Diagnosis:   Patient Active Problem List   Diagnosis Date Noted  . Borderline personality disorder ([place]) [F60.3] 04/11/2017  . Bipolar I disorder, current or most recent episode manic, with psychotic features ([place]) [F31.2] 04/10/2017  . Bipolar disorder ([place]) [F31.9] 04/10/2017  . Abnormal MRI of head [R93.0] 04/21/2016  . Chronic migraine without aura [G43.709] 03/27/2015  . Mild persistent asthma [J45.30] 12/30/2014  . Allergic rhinitis due to pollen [J30.1] 12/30/2014  . 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 Nasal polyposis [J33.9] 12/30/2014   Total Time spent with patient: 25  minutes  Past Psychiatric History: See H&P  Past Medical History:  Past Medical History:  Diagnosis Date  . Allergic rhinitis   . Anxiety   . Asthma   . Back pain   . Bipolar disorder ([place])   . Chronic pain   . Migraines   . Nasal polyps   . Nausea   . Neck pain   . OCD (obsessive compulsive disorder)     Past Surgical History:  Procedure Laterality Date  . NASAL SINUS SURGERY    . WISDOM TOOTH EXTRACTION     Family History:  Family History  Problem Relation Age of Onset  . Healthy Father   . Healthy Mother   . Arthritis Maternal Grandmother   . Arthritis Maternal Grandfather   . Depression Paternal Grandmother    Family Psychiatric  History: See H&P Social History:  Social History   Substance and Sexual Activity  Alcohol Use Yes  . Alcohol/week: 0.0 oz   Comment: Occasional alcohol use     Social History   Substance and Sexual Activity  Drug Use No    Social History   Socioeconomic History  . Marital status: Married    Spouse name: None  . Number of children: 0  . Years of education: 50  . Highest education level: None  Social Needs  . Financial resource strain: None  . Food insecurity - worry: None  . Food insecurity - inability: None  . Transportation needs - medical: None  . Transportation needs - non-medical: None  Occupational History  . Occupation:  Unemployed  Tobacco Use  . Smoking status: Current Every Day Smoker    Packs/day: 0.50    Types: Cigarettes  . Smokeless tobacco: Never Used  Substance and Sexual Activity  . Alcohol use: Yes    Alcohol/week: 0.0 oz    Comment: Occasional alcohol use  . Drug use: No  . Sexual activity: Yes    Birth control/protection: Condom  Other Topics Concern  . None  Social History Narrative   Lives at home alone.   Right-handed.   3 glasses of green tea per day.   Additional Social History:                         Sleep: Good  Appetite:  Good  Current Medications: Current  Facility-Administered Medications  Medication Dose Route Frequency Provider Last Rate Last Dose  . albuterol (PROVENTIL HFA;VENTOLIN HFA) 108 (90 Base) MCG/ACT inhaler 1-2 puff  1-2 puff Inhalation Q4H PRN Lu Duffel, [name], NP   2 puff at 04/13/17 0927  . alum & mag hydroxide-simeth (MAALOX/MYLANTA) 200-200-20 MG/5ML suspension 30 mL  30 mL Oral Q6H PRN [name], [name], NP   30 mL at 04/12/17 0953  . beclomethasone (QVAR) 40 MCG/ACT inhaler 1 puff  1 puff Inhalation BID PRN Lu Duffel, [name], NP      . [START ON 04/14/2017] buPROPion (WELLBUTRIN XL) 24 hr tablet 150 mg  150 mg Oral Daily Money, Lowry Ram, FNP      . clonazePAM (KLONOPIN) tablet 0.5 mg  0.5 mg Oral BID PRN Marigrace Mccole, Myer Peer, MD   0.5 mg at 04/13/17 0753  . cyclobenzaprine (FLEXERIL) tablet 5 mg  5 mg Oral Q6H PRN [name], [name], NP   5 mg at 04/13/17 1308  . fluticasone (FLONASE) 50 MCG/ACT nasal spray 1 spray  1 spray Each Nare Daily Money, Lowry Ram, FNP   1 spray at 04/13/17 1456  . gabapentin (NEURONTIN) capsule 800 mg  800 mg Oral TID Lu Duffel, [name], NP   800 mg at 04/13/17 1207  . hydrOXYzine (ATARAX/VISTARIL) tablet 50 mg  50 mg Oral TID PRN Artist Beach, MD   50 mg at 04/12/17 2205  . magnesium hydroxide (MILK OF MAGNESIA) suspension 30 mL  30 mL Oral Daily PRN [name], [name], NP      . metroNIDAZOLE (FLAGYL) tablet 500 mg  500 mg Oral Q8H [name], [name], MD   500 mg at 04/13/17 1456  . naproxen (NAPROSYN) tablet 500 mg  500 mg Oral BID [name], [name], NP   500 mg at 04/13/17 0753  . nicotine (NICODERM CQ - dosed in mg/24 hours) patch 21 mg  21 mg Transdermal Daily Aldair Rickel, Myer Peer, MD   21 mg at 04/13/17 0753  . ondansetron (ZOFRAN) tablet 4 mg  4 mg Oral Q8H PRN [name], [name], NP      . risperiDONE (RISPERDAL) tablet 1 mg  1 mg Oral QHS [name], [name], MD   1 mg at 04/12/17 2205  . traZODone (DESYREL) tablet 50 mg  50 mg Oral QHS PRN,MR X 1 Laverle Hobby, PA-C   50 mg at 04/12/17  2308    Lab Results: No results found for this or any previous visit (from the past 66 hour(s)).  Blood Alcohol level:  Lab Results  Component Value Date   ETH <10 04/09/2017   ETH <5 62/83/1517    Metabolic Disorder Labs: No results found for: HGBA1C, MPG  No results found for: PROLACTIN No results found for: CHOL, TRIG, HDL, CHOLHDL, VLDL, LDLCALC  Physical Findings: AIMS: Facial and Oral Movements Muscles of Facial Expression: None, normal Lips and Perioral Area: None, normal Jaw: None, normal Tongue: None, normal,Extremity Movements Upper (arms, wrists, hands, fingers): None, normal Lower (legs, knees, ankles, toes): None, normal, Trunk Movements Neck, shoulders, hips: None, normal, Overall Severity Severity of abnormal movements (highest score from questions above): None, normal Incapacitation due to abnormal movements: None, normal Patient's awareness of abnormal movements (rate only patient's report): No Awareness, Dental Status Current problems with teeth and/or dentures?: No Does patient usually wear dentures?: No  CIWA:  CIWA-Ar Total: 1 COWS:  COWS Total Score: 2  Musculoskeletal: Strength & Muscle Tone: within normal limits Gait & Station: normal Patient leans: N/A  Psychiatric Specialty Exam: Physical Exam  Nursing note and vitals reviewed. Constitutional: She is oriented to person, place, and time. She appears well-developed and well-nourished.  Respiratory: Effort normal.  Musculoskeletal: Normal range of motion.  Neurological: She is alert and oriented to person, place, and time.  Skin: Skin is warm.    Review of Systems  Constitutional: Negative.   HENT: Negative.   Eyes: Negative.   Respiratory: Negative.   Cardiovascular: Negative.   Gastrointestinal: Negative.   Genitourinary: Negative.   Musculoskeletal: Negative.   Skin: Negative.   Neurological: Negative.   Endo/Heme/Allergies: Negative.   Psychiatric/Behavioral: Negative for  depression, hallucinations and suicidal ideas. The patient is nervous/anxious.     Blood pressure 120/73, pulse (!) 105, temperature 97.8 F (36.6 C), temperature source Oral, resp. rate 16, last menstrual period 03/19/2017, SpO2 100 %.There is no height or weight on file to calculate BMI.  General Appearance: Casual  Eye Contact:  Good  Speech:  Clear and Coherent and Normal Rate  Volume:  Normal  Mood:  Euthymic  Affect:  Congruent  Thought Process:  Goal Directed and Descriptions of Associations: Intact  Orientation:  Full (Time, Place, and Person)  Thought Content:  WDL  Suicidal Thoughts:  No  Homicidal Thoughts:  No  Memory:  Immediate;   Good Recent;   Good Remote;   Good  Judgement:  Good  Insight:  Good  Psychomotor Activity:  Normal  Concentration:  Concentration: Good and Attention Span: Good  Recall:  Good  Fund of Knowledge:  Good  Language:  Good  Akathisia:  No  Handed:  Right  AIMS (if indicated):     Assets:  Communication Skills Desire for Improvement Financial Resources/Insurance Housing Physical Health Social Support Transportation  ADL's:  Intact  Cognition:  WNL  Sleep:  Number of Hours: 5.75   Problems Addressed: Bipolar Disorder Borderline Personality Disorder Sinus congestion  Treatment Plan Summary: Daily contact with patient to assess and evaluate symptoms and progress in treatment, Medication management and Plan is to:  -Stop Wellbutrin SR -Start Wellbutrin XL 150 mg PO Daily for mood stability -Continue Risperidone 1 mg PO QHS for mood stability -Continue Neurontin 800 mg PO TID for neuropathic pain -Continue Klonopin 0.5 mg PO BID PRN for anxiety -Start Flonase 1 spray each nostril daily for sinus congestion -Continue Trazodone 50 mh PO QHS for insomnia -Encourage group therapy participation  Lewis Shock, FNP 04/13/2017, 2:59 PM   Have reviewed NP Progress Note and have met with patient. She reports she is feeling better and  hoping for discharge soon. As discussed with CSW, mother has expressed concerns about disposition planning options. With patient's express consent and in  her presence , I contacted mother via phone . Mother states that she is worried patient may continue to cut and wants to insure that there is adequate disposition planning. Patient states she would be willing to go to an IOP setting, particularly one at [facility] in [place], which she attended before and which she states was very beneficial. Mother states she wants patient to go live with her after discharge, to which patient reports some ambivalence but tentative agreement . They both expressed interest in a family meeting prior to discharge, which we have scheduled for tomorrow at 2 PM. Gabriel Earing MD

## 2017-04-13 NOTE — Progress Notes (Addendum)
1600: Patient continues to split staff. Voices persistent requests and complaints. Asking for tramadol, increase in her klonopin. Continues to state, "the doctor told me he'd send in the tramadol for me. I mean he just told me. You can go ahead and give it to me." Upset that straps to her ice pack were not provided. "Well, I had them yesterday." Unit policy has been explained to patient and reminded patient of her hx of self harm. Patient complaining that her feet are swelling bilat. Assessed by writer and feet do not appear outside of baseline. Again encouraged patient to wear her Uggs (boots) for soft but firm support. Reminded patient to elevate feet if she is uncomfortable. Also reminded patient of earlier suggestion to write down list of concerns to present to provider when seen. Reminded patient that this writer is unable to fulfill many of her demands as this Probation officer does not prescribe or diagnose. Patient verbalizes minimal understanding and remains somatically focused and is medication seeking in her behavior. Will continue to set kind but enforceable limits and boundaries.  1700: Received call from patient's mother who states she will only attend family session tomorrow at 2pm if husband is present. "Deneise Lever, that's what we call her, likes to play [place] off of one another but her dad and I are 100% on the same page. I won't come unless he is there." Dr. Parke Poisson updated and VM left for [place], [place]. Verbal order given for tramadol 50mg  x 1. Patient was medicated and ice pack refilled. Patient then complained, "I'm having auras. I get these when I get migraines. I will start puking my guts up. I have to have my imitrex. The tramadol won't help my migraine."  Dr. Parke Poisson notified however given patient just received klonopin and tramadol, no new orders received. Patient found sitting in dayroom socializing with peers under the flurescent lighting. Informed patient that imitrex would not be ordered and suggested patient go  lie down in her room to decrease stimuli. Additional ice pack provided to patient for her head. "So if I throw up you'll give me my phenergan?" Informed patient zofran would be given if vomiting occurred and that she would need to leave emesis for staff to observe. Patient irritable, dismissive of Probation officer and resumed conversation with female peer about her physical complaints.    1800: Patient went to dinner despite advisement by staff to lie down given patient's "rapidly oncoming migraine." Patient observed laughing and joking with peers, consumed full meal and was last patient to leave the cafeteria.   1900: Patient splitting staff by asking nurse on hall to switch her back to nicorette from patch. Per chart this is the 4th time she has switched between patch and gum during her admit. States, "oh, I forgot you told me yesterday I couldn't switch back and forth. But the other patient said it was helping her." Staff continues to attempt boundary setting however patient continues to have near constant needs, complaints. Received call from Dr. Parke Poisson. Reviewed amount of medications patient is currently taking. Verbal order to discontinue flexeril prn and klonopin prn which was completed.

## 2017-04-13 NOTE — [facility] Group Notes (Signed)
[facility] Group Notes:  (Nursing/MHT/Case Management/Adjunct)  Date:  04/13/2017  Time:  2:53 PM  Type of Therapy:  Psychoeducational Skills  Participation Level:  Active  Participation Quality:  Appropriate  Affect:  Appropriate  Cognitive:  Appropriate  Insight:  Appropriate  Engagement in Group:  Engaged  Modes of Intervention:  Education  Summary of Progress/Problems:  Patient was attentive and involved with therapeutic relaxation group.   Cammy Copa 04/13/2017, 2:53 PM

## 2017-04-13 NOTE — Plan of Care (Signed)
Patient attending groups, observed to be social in the dayroom with peers.  Patient has not engaged in self harm. No outbursts and while she voices very frequent needs, she has maintained her self control.

## 2017-04-13 NOTE — Progress Notes (Signed)
Adult Psychoeducational Group Note  Date:  04/13/2017 Time: 1600 Group Topic/Focus:  Healthy Communication:   The focus of this group is to discuss communication, barriers to communication, as well as healthy ways to communicate with others. Identifying Needs:   The focus of this group is to help patients identify their personal needs that have been historically problematic and identify healthy behaviors to address their needs.  Participation Level:  Minimal  Participation Quality:  Appropriate  Affect:  Appropriate  Cognitive:  Appropriate  Insight: Appropriate  Engagement in Group:  Engaged  Modes of Intervention:  Discussion, Role-play and Support  Additional Comments:  Pt needed to be redirected during group.   Larkin Ina Patience 04/13/2017, 5:05 PM

## 2017-04-14 MED ORDER — CLONAZEPAM 0.5 MG PO TABS
0.5000 mg | ORAL_TABLET | Freq: Once | ORAL | Status: AC
  Administered 2017-04-14: 0.5 mg via ORAL

## 2017-04-14 MED ORDER — BACITRACIN-NEOMYCIN-POLYMYXIN 400-5-5000 EX OINT
TOPICAL_OINTMENT | CUTANEOUS | Status: DC | PRN
  Administered 2017-04-14: 19:00:00 via TOPICAL
  Administered 2017-04-15: 1 via TOPICAL
  Filled 2017-04-14 (×4): qty 1

## 2017-04-14 MED ORDER — GABAPENTIN 300 MG PO CAPS
300.0000 mg | ORAL_CAPSULE | Freq: Three times a day (TID) | ORAL | Status: DC
  Administered 2017-04-14 – 2017-04-15 (×3): 300 mg via ORAL
  Filled 2017-04-14 (×8): qty 1

## 2017-04-14 MED ORDER — ARIPIPRAZOLE 5 MG PO TABS
5.0000 mg | ORAL_TABLET | Freq: Every day | ORAL | Status: DC
  Administered 2017-04-14 – 2017-04-15 (×2): 5 mg via ORAL
  Filled 2017-04-14 (×4): qty 1

## 2017-04-14 MED ORDER — CLONAZEPAM 0.5 MG PO TABS
ORAL_TABLET | ORAL | Status: AC
  Filled 2017-04-14: qty 1

## 2017-04-14 NOTE — Progress Notes (Signed)
Adult Psychoeducational Group Note  Date:  04/14/2017 Time:  10:06 PM  Group Topic/Focus:  Wrap-Up Group:   The focus of this group is to help patients review their daily goal of treatment and discuss progress on daily workbooks.  Participation Level:  Active  Participation Quality:  Appropriate and Drowsy  Affect:  Appropriate  Cognitive:  Alert and Oriented  Insight: Good  Engagement in Group:  Engaged  Modes of Intervention:  Discussion  Additional Comments:  Pt stated her day was good until her family visited, which she stated she doesn't get along with her father. Pt rated her day a 9/10 and stated she has a plan and is ready for discharge tomorrow. Pt stated she is afraid of the new facility she will be going to after discharge because she doesn't know what it will be like and she will be away from her pets.  Summers 04/14/2017, 10:06 PM

## 2017-04-14 NOTE — Progress Notes (Signed)
Patient ID: ZAIAH CREDEUR, female   DOB: 12/23/1985, 32 y.o.   MRN: [id number] D: Patient irritable earlier in shift, and stated that she was anxious about her family session.  Pt denies SI/HI/AVH, states that she had cut herself at home because she "wanted to release the stress, but I cut too much and hit the fat".  Pt educated on better stress management skills, and verbalized understanding.  Left forearm with staples OTA, CDI, no redness/drainage/edema at site.  A: Q 15 minute checks being maintained for safety, pt given all meds as scheduled, and medicated with a one time dose of Clonazepam 0.5mg  prior to her family session due to worsening anxiety per orders from Dr. Parke Poisson.    R: Pt is visible in the milieu, and appears calmer.  Will continue to monitor.

## 2017-04-14 NOTE — Progress Notes (Signed)
Adult Psychoeducational Group Note  Date:  04/14/2017 Time:  0900 Group Topic/Focus:  Goals Group:   The focus of this group is to help patients establish daily goals to achieve during treatment and discuss how the patient can incorporate goal setting into their daily lives to aide in recovery.  Participation Level:  Minimal  Participation Quality:  Inattentive  Affect:  Appropriate  Cognitive:  Disorganized   Insight: Limited  Engagement in Group:  None  Modes of Intervention:  Discussion, Orientation and Support  Additional Comments:    [name] 04/14/2017, 4:43 PM

## 2017-04-14 NOTE — Progress Notes (Signed)
[facility] MD Progress Note  04/14/2017 12:57 PM Carmen Daniels  MRN:  [id number]   Subjective:  Patient reports significant anxiety related to scheduled family meeting with her parents  ( scheduled for later today). States she has a strained relationship with her father, but today states " I know they love me and they have my best interest at heart", so states she will allow father to participate in family therapy session. Remains somatically focused, reporting pain,mainly on self inflicted injury site, but also lower back , legs. However, less focused on pain and medication issues today as compared to earlier this week. Denies suicidal or self injurious ideations.  She states she is hopeful for discharge today or soon.  Objective: I have discussed case with treatment team and have met with patient. Patient reports ongoing anxiety/sense of apprehension, and today worries about upcoming family meeting with her parents . Parents are involved in her care and have expressed desire for patient to return to live with them after discharge rather than return to her home ( which reportedly is owned by parents ) . She expresses some ambivalence about this . Denies suicidal ideations, and states recent self inflicted laceration was not suicidal in intent. 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 We reviewed medication history- states she has been on several different medications in the past, and reports that Wellbutrin has been effective . She states Geodon was poorly tolerated. Has been on Seroquel but developed excessive appetite and expresses concern about medications often associated with weight gain. Reports history of short lived mood swings, usually lasting minutes or hours . States she remembers she was on  Abilify in the past without side effects and thinks it might have helped . We have also discussed rationale to minimize long term management with BZDs, due to abuse potential and other long term potential side effects. She states she does not think Neurontin ( which is new medication trial for her) is helping significantly, and it may be temporally related to lower extremity edema, which is mild but which she notices on feet bilaterally She is visible on unit, interacting appropriately with peers, noted to be animated, laughing with peers in day room  Of note, L forearm laceration appears clean, without significant inflammation, no active bleeding and no exudate .  Principal Problem: Borderline personality disorder ([place]) Diagnosis:   Patient Active Problem List   Diagnosis Date Noted  . Sinus congestion [R09.81] 04/13/2017  . Borderline personality disorder ([place]) [F60.3] 04/11/2017  . Bipolar I disorder, current or most recent episode manic, with psychotic features ([place]) [F31.2] 04/10/2017  . Bipolar disorder ([place]) [F31.9] 04/10/2017  . Abnormal MRI of head [R93.0] 04/21/2016  . Chronic migraine without aura [G43.709] 03/27/2015  . Mild persistent asthma [J45.30] 12/30/2014  . Allergic rhinitis due to pollen [J30.1] 12/30/2014  . 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 Nasal polyposis [J33.9] 12/30/2014   Total Time spent with patient: 20 minutes  Past Psychiatric History: See H&P  Past Medical History:  Past Medical History:  Diagnosis Date  . Allergic rhinitis   . Anxiety   . Asthma   . Back pain   . Bipolar disorder ([place])   . Chronic pain   . Migraines   . Nasal  polyps   . Nausea   . Neck pain   . OCD (obsessive compulsive disorder)     Past Surgical History:  Procedure Laterality Date  . NASAL SINUS SURGERY    . WISDOM TOOTH EXTRACTION     Family History:  Family History  Problem Relation Age of Onset  . Healthy Father   . Healthy Mother   .  Arthritis Maternal Grandmother   . Arthritis Maternal Grandfather   . Depression Paternal Grandmother    Family Psychiatric  History: See H&P Social History:  Social History   Substance and Sexual Activity  Alcohol Use Yes  . Alcohol/week: 0.0 oz   Comment: Occasional alcohol use     Social History   Substance and Sexual Activity  Drug Use No    Social History   Socioeconomic History  . Marital status: Married    Spouse name: None  . Number of children: 0  . Years of education: 6  . Highest education level: None  Social Needs  . Financial resource strain: None  . Food insecurity - worry: None  . Food insecurity - inability: None  . Transportation needs - medical: None  . Transportation needs - non-medical: None  Occupational History  . Occupation: Unemployed  Tobacco Use  . Smoking status: Current Every Day Smoker    Packs/day: 0.50    Types: Cigarettes  . Smokeless tobacco: Never Used  Substance and Sexual Activity  . Alcohol use: Yes    Alcohol/week: 0.0 oz    Comment: Occasional alcohol use  . Drug use: No  . Sexual activity: Yes    Birth control/protection: Condom  Other Topics Concern  . None  Social History Narrative   Lives at home alone.   Right-handed.   3 glasses of green tea per day.   Additional Social History:   Sleep: Fair  Appetite:  Good  Current Medications: Current Facility-Administered Medications  Medication Dose Route Frequency Provider Last Rate Last Dose  . albuterol (PROVENTIL HFA;VENTOLIN HFA) 108 (90 Base) MCG/ACT inhaler 1-2 puff  1-2 puff Inhalation Q4H PRN Lu Duffel, [name], NP   2 puff at 04/13/17 0927  . alum & mag hydroxide-simeth (MAALOX/MYLANTA) 200-200-20 MG/5ML suspension 30 mL  30 mL Oral Q6H PRN [name], [name], NP   30 mL at 04/12/17 0953  . beclomethasone (QVAR) 40 MCG/ACT inhaler 1 puff  1 puff Inhalation BID PRN [name], [name], NP      . buPROPion (WELLBUTRIN XL) 24 hr tablet 150 mg  150 mg Oral Daily  Money, Travis B, FNP   150 mg at 04/14/17 0740  . fluticasone (FLONASE) 50 MCG/ACT nasal spray 1 spray  1 spray Each Nare Daily Money, Lowry Ram, FNP   1 spray at 04/14/17 0741  . gabapentin (NEURONTIN) capsule 800 mg  800 mg Oral TID Lu Duffel, [name], NP   800 mg at 04/14/17 0740  . hydrOXYzine (ATARAX/VISTARIL) tablet 50 mg  50 mg Oral TID PRN Artist Beach, MD   50 mg at 04/13/17 2313  . magnesium hydroxide (MILK OF MAGNESIA) suspension 30 mL  30 mL Oral Daily PRN [name], [name], NP      . metroNIDAZOLE (FLAGYL) tablet 500 mg  500 mg Oral Q8H [name], [name], MD   500 mg at 04/14/17 0641  . naproxen (NAPROSYN) tablet 500 mg  500 mg Oral BID [name], [name], NP   500 mg at 04/14/17 0740  . nicotine polacrilex (NICORETTE) gum 2  mg  2 mg Oral PRN Zan Triska, Myer Peer, MD   2 mg at 04/14/17 0743  . ondansetron (ZOFRAN) tablet 4 mg  4 mg Oral Q8H PRN [name], [name], NP      . QUEtiapine (SEROQUEL) tablet 50 mg  50 mg Oral QHS,MR X 1 Simon, Spencer E, PA-C   50 mg at 04/14/17 0000  . traZODone (DESYREL) tablet 50 mg  50 mg Oral QHS PRN,MR X 1 Simon, Spencer E, PA-C   50 mg at 04/14/17 0001    Lab Results: No results found for this or any previous visit (from the past 29 hour(s)).  Blood Alcohol level:  Lab Results  Component Value Date   ETH <10 04/09/2017   ETH <5 42/39/5320    Metabolic Disorder Labs: No results found for: HGBA1C, MPG No results found for: PROLACTIN No results found for: CHOL, TRIG, HDL, CHOLHDL, VLDL, LDLCALC  Physical Findings: AIMS: Facial and Oral Movements Muscles of Facial Expression: None, normal Lips and Perioral Area: None, normal Jaw: None, normal Tongue: None, normal,Extremity Movements Upper (arms, wrists, hands, fingers): None, normal Lower (legs, knees, ankles, toes): None, normal, Trunk Movements Neck, shoulders, hips: None, normal, Overall Severity Severity of abnormal movements (highest score from questions above): None,  normal Incapacitation due to abnormal movements: None, normal Patient's awareness of abnormal movements (rate only patient's report): No Awareness, Dental Status Current problems with teeth and/or dentures?: No Does patient usually wear dentures?: No  CIWA:  CIWA-Ar Total: 1 COWS:  COWS Total Score: 2  Musculoskeletal: Strength & Muscle Tone: within normal limits Gait & Station: normal Patient leans: N/A  Psychiatric Specialty Exam: Physical Exam  Nursing note and vitals reviewed. Constitutional: She is oriented to person, place, and time. She appears well-developed and well-nourished.  Respiratory: Effort normal.  Musculoskeletal: Normal range of motion.  Neurological: She is alert and oriented to person, place, and time.  Skin: Skin is warm.    Review of Systems  Constitutional: Negative.   HENT: Negative.   Eyes: Negative.   Respiratory: Negative.   Cardiovascular: Negative.   Gastrointestinal: Negative.   Genitourinary: Negative.   Musculoskeletal: Negative.   Skin: Negative.   Neurological: Negative.   Endo/Heme/Allergies: Negative.   Psychiatric/Behavioral: Negative for depression, hallucinations and suicidal ideas. The patient is nervous/anxious.   no chest pain, no dyspnea, reports pain on legs and on wound site . Reports peripheral edema on feet bilaterally.  Blood pressure 107/90, pulse (!) 116, temperature (!) 97.4 F (36.3 C), temperature source Oral, resp. rate 18, last menstrual period 03/19/2017, SpO2 100 %.There is no height or weight on file to calculate BMI.  General Appearance: improved grooming   Eye Contact:  Good  Speech:  Normal Rate  Volume:  Normal  Mood:  minimizes depression, reports mood "OK" reports anxiety  Affect:  anxious, reactive, smiles at times appropriately   Thought Process:  Linear and Descriptions of Associations: Intact  Orientation:  Other:  fully alert and attentive   Thought Content:  no hallucinations, no delusions, not  internally preoccupied   Suicidal Thoughts:  No denies any suicidal or self injurious ideations, denies homicidal or violent ideations, contracts for safety on unit   Homicidal Thoughts:  No  Memory:  recent and remote grossly intact   Judgement:  Fair- improving   Insight:  Fair  Psychomotor Activity:  Normal  Concentration:  Concentration: Good and Attention Span: Good  Recall:  Good  Fund of Knowledge:  Good  Language:  Good  Akathisia:  No  Handed:  Right  AIMS (if indicated):     Assets:  Communication Skills Desire for Improvement Financial Resources/Insurance Housing Physical Health Social Support Transportation  ADL's:  Intact  Cognition:  WNL  Sleep:  Number of Hours: 5.75   Assessment - patient reports ongoing anxiety, and today is focused on family meeting with parents, which is scheduled for later today. She denies /minimizes depression, endorses anxiety as her major issue. Denies SI or self injurious ideations, and is hopeful for discharge soon. States her parents want her to go live with them , but she is ambivalent about this. Remains medication focused, but to lesser degree. Today reports Abilify has been helpful and well tolerated in the past . States she feels Wellbutrin has been a good medication for her as well . Does not feel Neurontin is helping and may be associated with peripheral edema . Treatment Plan Summary: Treatment Plan reviewed as below today 2/21 Daily contact with patient to assess and evaluate symptoms and progress in treatment, Medication management and Plan is to: -Continue to encourage group and milieu participation to work on coping skills and symptom reduction -Start Abilify 5 mgrs QDAY for mood disorder, augmentation -Continue  Wellbutrin XL 150 mg QDAY for mood disorder , depression  -Initiate taper- decrease  Neurontin 300  mg PO TID - see abov -Continue Trazodone 50 mgrs QHS PRN  for insomnia as needed  -Treatment team working on  disposition planning options as above   Jenne Campus, MD 04/14/2017, 12:57 PM   Patient ID: Carmen Daniels, female   DOB: 01-14-1986, 32 y.o.   MRN: [id number]

## 2017-04-14 NOTE — Progress Notes (Signed)
Patient ID: Carmen Daniels, female   DOB: 1985-06-16, 32 y.o.   MRN: [id number]  Pt currently presents with an appropriate affect and hyperactive behavior. Pt reports to writer that their goal is to "go to this treatment center in [place]." Pt reports ongoing 10/10 pain in multiple places including her feet, legs, abdomen, arm, neck and head. Some swelling still noted in extremities, pt wearing shoes on the unit tonight. Pt states "I'm just always in pain." Continues to ask for further pain relief medications. Pt reports good sleep with medication regimen last night.   Pt provided with medications per providers orders. Pt's labs and vitals were monitored throughout the night. Pt given a 1:1 about emotional and mental status. Pt supported and encouraged to express concerns and questions. Pt educated on medications, alternative pain relief measures and assertiveness techniques. Pt wound assessed, no drainage or erythema noted. Cover with clean, dry dressing.   Pt's safety ensured with 15 minute and environmental checks. Pt currently denies SI/HI and A/V hallucinations. Pt verbally agrees to seek staff if SI/HI or A/VH occurs and to consult with staff before acting on any harmful thoughts. Pt frequently asks for interventions and medications. Behavior is still slightly impulsive and intrusive. Asks multiple questions about her roommates POC. Pt mood is more stable and she is able to maintain appropriate boundaries. Will continue POC.

## 2017-04-14 NOTE — Progress Notes (Signed)
Adult Services Patient-Family Contact/Session  Attendees:  Dr Parke Poisson, Lurline Idol, parents: Randalyn Rhea, pt.  Goal(s):  Discuss plans for discharge.  Safety Concerns:    Narrative:  Parents discussed their desire for pt to go to [facility] residential program in [place].  Discussed concerns for pt cutting, volatile relationship with exhusband, drug seeking/benzo/opiate use.  Pt initially resistant but eventually agreed that the residential program would be beneficial.  Had some questions about her exhusband being allowed to continue living in the house, about being able to see her dog.  Possible admission early March.  Pt will stay with parents during the interim, will attend therapy with [facility].  Will not see Dr Toy Care as everyone would like pt to remain on currents meds prior to admission at [facility].    Barrier(s):    Interventions:    Recommendation(s):  Will discharge tomorrow.  Follow-up Required:  No  Explanation:    Joanne Chars, LCSW 04/14/2017, 3:27 PM

## 2017-04-14 NOTE — Progress Notes (Signed)
Psychoeducational Group Note  Date:  04/14/2017 Time1000 Group Topic/Focus:  Crisis Planning:   The purpose of this group is to help patients create a crisis plan for use upon discharge or in the future, as needed.  Participation Level: Did Not Attend  Participation Quality:  Not Applicable  Affect:  Not Applicable  Cognitive:  Not Applicable  Insight:  Not Applicable  Engagement in Group: Not Applicable  Additional Comments:    [name] 04/14/2017, 2:50 PM

## 2017-04-15 MED ORDER — ARIPIPRAZOLE 5 MG PO TABS: 5.0000 mg | ORAL_TABLET | Freq: Every day | ORAL | 0 refills | Status: DC

## 2017-04-15 MED ORDER — ALBUTEROL SULFATE HFA 108 (90 BASE) MCG/ACT IN AERS
1.0000 | INHALATION_SPRAY | RESPIRATORY_TRACT | 0 refills | Status: DC | PRN
Start: 2017-04-15 — End: 2019-07-31

## 2017-04-15 MED ORDER — HYDROXYZINE HCL 50 MG PO TABS: 50.0000 mg | ORAL_TABLET | Freq: Three times a day (TID) | ORAL | 0 refills | Status: DC | PRN

## 2017-04-15 MED ORDER — TRAZODONE HCL 50 MG PO TABS: 50.0000 mg | ORAL_TABLET | Freq: Every evening | ORAL | 0 refills | Status: DC | PRN

## 2017-04-15 MED ORDER — BUPROPION HCL ER (XL) 150 MG PO TB24: 150.0000 mg | ORAL_TABLET | Freq: Every day | ORAL | 0 refills | Status: DC

## 2017-04-15 MED ORDER — GABAPENTIN 300 MG PO CAPS: 300.0000 mg | ORAL_CAPSULE | Freq: Three times a day (TID) | ORAL | 0 refills | Status: DC

## 2017-04-15 NOTE — Progress Notes (Addendum)
D: Pt A & O 4. Denies SI, HI, AVh and pain at time. Presents animated but anxious. Cooperative with unit routines. Attended unit groups as scheduled and was engaged. Compliant with medications when offered. Pt D/C home as ordered. Picked up by her mother in the lobby.  A: D/C instructions reviewed with pt including prescriptions and follow up appointments. All belongings in locker 16 returned to pt at time of departure. Compliance with d/c instructions encouraged. Safety checks maintained till time of d/c without self harm gestures.  R: Pt receptive to care. Verbalized understanding related to d/c instructions. Signed belonging sheet in agreement with items received.  Ambulatory with a steady gait. Appears to be in no physical distress at time of departure from facility.

## 2017-04-15 NOTE — Progress Notes (Signed)
  [facility] Adult Case Management Discharge Plan :  Will you be returning to the same living situation after discharge:  No. Will be staying with mother. At discharge, do you have transportation home?: Yes,  mother Do you have the ability to pay for your medications: Yes,  medicaid  Release of information consent forms completed and in the chart;  Patient's signature needed at discharge.  Patient to Follow up at: Follow-up Information    [name], MD Follow up on 04/22/2017.   Specialty:  Internal Medicine Why:  Follow up appointment is at [facility] [place], [address] [place], [address].  The appointment is scheduled for March 1st, 2019 at 2:15 with Dr. Mikeal Hawthorne. Contact information: [place] [address] [phone]        Richard Ramos Follow up.   Contact information: [address]  [place], [place] [address] Phone: [phone] Fax: [phone]       [facility]ora Piccolo. Go on 04/21/2017.   Why:  Please attend your therapy appt on Thursday, 04/21/17, at 2:00pm. Contact information: [address] [place]  [place] , [place] [address] [phone] F: [phone]       [facility]. Call.   Why:  Please follow up with [facility] admissions regarding an admission date there. Contact information: [place], [place] [address] P: [phone] F: [phone]          Next level of care provider has access to [place] and Suicide Prevention discussed: Yes,  with mother  Have you used any form of tobacco in the last 30 days? (Cigarettes, Smokeless Tobacco, Cigars, and/or Pipes): Yes  Has patient been referred to the Quitline?: Patient refused referral  Patient has been referred for addiction treatment: Yes  Joanne Chars, [place] 04/15/2017, 11:50 AM

## 2017-04-15 NOTE — [facility] Group Notes (Signed)
[facility] LCSW Group Therapy Note  Date/Time: 04/14/17, 1315  Type of Therapy/Topic:  Group Therapy:  Balance in Life  Participation Level:  active  Description of Group:    This group will address the concept of balance and how it feels and looks when one is unbalanced. Patients will be encouraged to process areas in their lives that are out of balance, and identify reasons for remaining unbalanced. Facilitators will guide patients utilizing problem- solving interventions to address and correct the stressor making their life unbalanced. Understanding and applying boundaries will be explored and addressed for obtaining  and maintaining a balanced life. Patients will be encouraged to explore ways to assertively make their unbalanced needs known to significant others in their lives, using other group members and facilitator for support and feedback.  Therapeutic Goals: 1. Patient will identify two or more emotions or situations they have that consume much of in their lives. 2. Patient will identify signs/triggers that life has become out of balance:  3. Patient will identify two ways to set boundaries in order to achieve balance in their lives:  4. Patient will demonstrate ability to communicate their needs through discussion and/or role plays  Summary of Patient Progress:Pt was gone for part of group but did make several comments during group discussion regarding ways to address these areas in life.            Therapeutic Modalities:   Cognitive Behavioral Therapy Solution-Focused Therapy Assertiveness Training  Lurline Idol, [place]

## 2017-04-15 NOTE — [facility] Suicide Risk Assessment (Signed)
[facility] Discharge Suicide Risk Assessment   Principal Problem: Borderline personality disorder [facility]) Discharge Diagnoses:  Patient Active Problem List   Diagnosis Date Noted  . Sinus congestion [R09.81] 04/13/2017  . Borderline personality disorder ([place]) [F60.3] 04/11/2017  . Bipolar I disorder, current or most recent episode manic, with psychotic features ([place]) [F31.2] 04/10/2017  . Bipolar disorder ([place]) [F31.9] 04/10/2017  . Abnormal MRI of head [R93.0] 04/21/2016  . Chronic migraine without aura [G43.709] 03/27/2015  . Mild persistent asthma [J45.30] 12/30/2014  . Allergic rhinitis due to pollen [J30.1] 12/30/2014  . 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 Nasal polyposis [J33.9] 12/30/2014    Total Time spent with patient: 30 minutes  Musculoskeletal: Strength & Muscle Tone: within normal limits Gait & Station: normal Patient leans: N/A  Psychiatric Specialty Exam: ROS reports mild headache, no chest pain, no shortness of breath, no vomiting, no diarrhea, no rash, no fever, no chills   Blood pressure (!) 141/62, pulse 91, temperature (!) 97.4 F (36.3 C), temperature source Oral, resp. rate 18, last menstrual period 03/19/2017, SpO2 100 %.There is no height or weight on file to calculate BMI.  General Appearance: improved grooming  Eye Contact::  Good  Speech:  Normal Rate409  Volume:  Normal  Mood:  reports " my mood is a lot better", and denies depression, characterizes mood as 10/10 today  Affect:  Appropriate and more reactive , less anxious  Thought Process:  Linear and Descriptions of Associations: Intact  Orientation:  Other:  fully alert and attentive  Thought Content:  no hallucinations, no delusions, not internally preoccupied   Suicidal Thoughts:  No denies any suicidal or self injurious ideations, denies any homicidal or violent ideations, and is future oriented   Homicidal Thoughts:  No  Memory:  recent and remote grossly intact   Judgement:   Other:  fair- improving   Insight:  Fair and improving   Psychomotor Activity:  Normal  Concentration:  Good  Recall:  Good  Fund of Knowledge:Good  Language: Good  Akathisia:  No  Handed:  Right  AIMS (if indicated):   no abnormal or involuntary movements noted or reported   Assets:  Communication Skills Desire for Improvement Resilience  Sleep:  Number of Hours: 6.25  Cognition: WNL  ADL's:  Intact   Mental Status Per Nursing Assessment::   On Admission:     Demographic Factors:  32 year old female, no children,  unemployed, lives with husband   Loss Factors: Relationship/marital stressors , reports she was off medications prior to admission  Historical Factors: Prior diagnoses of Bipolar Disorder and of Borderline Personality Disorder, history of self cutting   Risk Reduction Factors:   Sense of responsibility to family, Living with another person, especially a relative, Positive social support and Positive coping skills or problem solving skills  Continued Clinical Symptoms:  At this time patient is alert, attentive, reports feeling significantly better compared to admission, and denies feeling depressed, affect is more reactive, less anxious, not irritable today, no thought disorder, no suicidal or self injurious ideations, denies any current thoughts of cutting or self injury, no hallucinations, no delusions, future oriented.  Reports she is sleeping well, appetite is good. States " my energy level feels normal". Denies medication side effects other than mild distal edema on feet bilaterally and which may be related to Gabapentin, due to which Gabapentin dose has been decreased .  We have reviewed side effect profiles, to include risk of movement or metabolic disorders  on Abilify. We had a family meeting with patient, her parents, CSW, Probation officer yesterday. Parents are supportive, and corroborated that she has improved. Meeting was focused on disposition plans , which patient  agreed to: at this time plans to return to live with her parents for added support, and then plans to go to [facility] ( bed availability pending) which is a residential setting of about one month duration. After discharge is planning to return to Dr. Toy Care and to outpatient therapy.   Cognitive Features That Contribute To Risk:  No gross cognitive deficits noted upon discharge. Is alert , attentive, and oriented x 3   Suicide Risk:  Mild:  Suicidal ideation of limited frequency, intensity, duration, and specificity.  There are no identifiable plans, no associated intent, mild dysphoria and related symptoms, good self-control (both objective and subjective assessment), few other risk factors, and identifiable protective factors, including available and accessible social support.  Follow-up Information    [name], MD Follow up on 04/22/2017.   Specialty:  Internal Medicine Why:  Follow up appointment is at [facility], [address], [place], [address].  The appointment is scheduled for March 1st, 2019 at 2:15 with Dr. Mikeal Hawthorne. Contact information: [place] [address] [phone]        Richard Ramos Follow up.   Contact information: [address]  [place], [place] [address] Phone: [phone] Fax: [phone]       Valora Piccolo. Go on 04/21/2017.   Why:  Please attend your therapy appt on Thursday, 04/21/17, at 2:00pm. Contact information: [address] [place]  [place] , [place] [address] [phone] F: [phone]          Plan Of Care/Follow-up recommendations:  Activity:  as tolerated  Diet:  regular Tests:  NA Other:  See below  Patient is leaving unit in good spirits  Plans to go live with her parents, mother to pick her up later today Plans to follow up as delineated above, and plans to go to [facility] residential program within the next few days depending on bed availability.   Jenne Campus,  MD 04/15/2017, 9:31 AM

## 2017-04-15 NOTE — Progress Notes (Signed)
Recreation Therapy Notes  Date: 04/15/17 Time: 0930 Location: 300 Hall Dayroom  Group Topic: Stress Management  Goal Area(s) Addresses:  Patient will verbalize importance of using healthy stress management.  Patient will identify positive emotions associated with healthy stress management.   Intervention: Stress Management  Activity :  Meditation.  LRT introduced the stress management technique of meditation.  LRT played a meditation from the Calm app the focused on gratitude.  Patients were to listen and follow along with the meditation as it played.  Education:  Stress Management, Discharge Planning.   Education Outcome: Acknowledges edcuation/In group clarification offered/Needs additional education  Clinical Observations/Feedback: Pt did not attend group.    Victorino Sparrow, LRT/CTRS         Ria Comment, Matias Thurman A 04/15/2017 11:26 AM

## 2017-04-15 NOTE — Discharge Summary (Addendum)
Physician Discharge Summary Note  Patient:  Carmen Daniels is an 32 y.o., female MRN:  [id number] DOB:  12-09-1985 Patient phone:  [phone] (home)  Patient address:   [place] [address],  Total Time spent with patient: 20 minutes  Date of Admission:  04/10/2017 Date of Discharge: 04/15/17   Reason for Admission:  Worsening depression with SI  Principal Problem: Borderline personality disorder [facility]) Discharge Diagnoses: Patient Active Problem List   Diagnosis Date Noted  . Sinus congestion [R09.81] 04/13/2017  . Borderline personality disorder ([place]) [F60.3] 04/11/2017  . Bipolar I disorder, current or most recent episode manic, with psychotic features ([place]) [F31.2] 04/10/2017  . Bipolar disorder ([place]) [F31.9] 04/10/2017  . Abnormal MRI of head [R93.0] 04/21/2016  . Chronic migraine without aura [G43.709] 03/27/2015  . Mild persistent asthma [J45.30] 12/30/2014  . Allergic rhinitis due to pollen [J30.1] 12/30/2014  . 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 Nasal polyposis [J33.9] 12/30/2014    Past Psychiatric History: Long history of self mutilation. She has scars in her upper and lower extremities. She did well after a DBT program. She currently has a psychiatrist and a therapist. She has been tried on multiple medications over the years. Her psychiatrist just started her on Risperidone and Bupropion. She has been on Benzodiazepines chronically.  She has been hospitalized twice on account of self harming behavior.  No past history of frank mania. Short lived psychosis while under stress. No past history of suicidal attempt. History of violent outburst   Past Medical History:  Past Medical History:  Diagnosis Date  . Allergic rhinitis   . Anxiety   . Asthma   . Back pain   . Bipolar disorder ([place])   . Chronic pain   . Migraines   . Nasal polyps   . Nausea   . Neck pain   . OCD (obsessive compulsive disorder)     Past Surgical  History:  Procedure Laterality Date  . NASAL SINUS SURGERY    . WISDOM TOOTH EXTRACTION     Family History:  Family History  Problem Relation Age of Onset  . Healthy Father   . Healthy Mother   . Arthritis Maternal Grandmother   . Arthritis Maternal Grandfather   . Depression Paternal Grandmother    Family Psychiatric  History: Reports afmily history of Bipolar Disorder in her paternal family. No family history of suicide  Social History:  Social History   Substance and Sexual Activity  Alcohol Use Yes  . Alcohol/week: 0.0 oz   Comment: Occasional alcohol use     Social History   Substance and Sexual Activity  Drug Use No    Social History   Socioeconomic History  . Marital status: Married    Spouse name: None  . Number of children: 0  . Years of education: 62  . Highest education level: None  Social Needs  . Financial resource strain: None  . Food insecurity - worry: None  . Food insecurity - inability: None  . Transportation needs - medical: None  . Transportation needs - non-medical: None  Occupational History  . Occupation: Unemployed  Tobacco Use  . Smoking status: Current Every Day Smoker    Packs/day: 0.50    Types: Cigarettes  . Smokeless tobacco: Never Used  Substance and Sexual Activity  . Alcohol use: Yes    Alcohol/week: 0.0 oz    Comment: Occasional alcohol use  . Drug use: No  . Sexual activity: Yes  Birth control/protection: Condom  Other Topics Concern  . None  Social History Narrative   Lives at home alone.   Right-handed.   3 glasses of green tea per day.    Hospital Course:   04/11/17 [facility] MD Assessment: 32 y.o Caucasian female, divorced but still lives with her ex-husband, unemployed, on [place]. Background history of Bipolar Disorder Unspecified, [employer] and chronic pain. Presented to the ER via the police. Her family involuntarily committed her. She inflicted multiple lacerations to own forearm while at her house. She just had an  argument with her ex-husband. She is reported to be having a lot of conflict with her ex-husband. Reported to be aggressive towards him. Patient has been off her medications since December. She reported seeing shadows. She reported hearing voices that make derogatory comments about her. Patient had 32 stitches.  Routine labs are WNL. Toxicology is negative. UDS is positive for opiates and benzo's. No alcohol.  Patient reports long history of cutting. She has multiple old scars at various stages of healing. She had been in a DBT program and that helped deal with cutting behavior. Patient says she does not cut to kill herself. Says she cuts to ease emotional pain. Her husband works night shift. Says he wanted to sleep and complained about the TV being loud. Patient says he then unplugged the cable from the input outside. Says when she went to reconnect it, he locked her outside. Patient says she finally came into the house and an argument ensured. Says he became upset and drove in is car. Patient says she became upset and cut self to ease emotional pain. Says she was alone then. She called her mother after cutting self. Says she was not suicidal. She is disappointed her family committed her. Conflicting report about when she stopped taking her medications.  Says she has not had reported hallucinations in the past month. Currently no hallucination in any modality. No other form of perceptual abnormality. No feelings of things being unreal. No feeling of impending doom. Not expressing any delusional statement. No suicidal thoughts. No homicidal thoughts. No thoughts of violence.  Denies any other stressors . No financial constraints. No legal issues.  Has good relationship with her therapist. No evidence of mania. No overwhelming anxiety. No evidence of PTSD.   No thoughts of harming others.  No access to weapons. She is very focused on controlled medications.  Patient remained on the [facility] unit for 4 days and  stabilized with medication and therapy. Patient was started on Abilify 5 mg Daily, continued on Wellbutrin XL 150 mg Daily, Neurontin 300 mg TID, and used Vistaril 50 mg TID PRN, and Trazodone 50 mg QHS PRN. Patient showed improvement with improved mood, affect, sleep, appetite, and interaction. Patient was seen in the day room interacting with peers and staff appropriately. Patient has been attending group sand participating. A family meeting was held and patient was allowed to return home with her mother. Patient denies any SI/HI/AVH and contracts for safety. Patient agrees to follow up with Suella Broad and Valora Piccolo. Patient is provided with prescriptions for her medications upon discharge.      Physical Findings: AIMS: Facial and Oral Movements Muscles of Facial Expression: None, normal Lips and Perioral Area: None, normal Jaw: None, normal Tongue: None, normal,Extremity Movements Upper (arms, wrists, hands, fingers): None, normal Lower (legs, knees, ankles, toes): None, normal, Trunk Movements Neck, shoulders, hips: None, normal, Overall Severity Severity of abnormal movements (highest score from questions above): None,  normal Incapacitation due to abnormal movements: None, normal Patient's awareness of abnormal movements (rate only patient's report): No Awareness, Dental Status Current problems with teeth and/or dentures?: No Does patient usually wear dentures?: No  CIWA:  CIWA-Ar Total: 1 COWS:  COWS Total Score: 2  Musculoskeletal: Strength & Muscle Tone: within normal limits Gait & Station: normal Patient leans: N/A  Psychiatric Specialty Exam: Physical Exam  Nursing note and vitals reviewed. Constitutional: She is oriented to person, place, and time. She appears well-developed and well-nourished.  Cardiovascular: Normal rate.  Respiratory: Effort normal.  Musculoskeletal: Normal range of motion.  Neurological: She is alert and oriented to person, place, and time.   Skin: Skin is warm.    Review of Systems  Constitutional: Negative.   HENT: Negative.   Eyes: Negative.   Respiratory: Negative.   Cardiovascular: Negative.   Gastrointestinal: Negative.   Genitourinary: Negative.   Musculoskeletal: Negative.   Skin: Negative.   Neurological: Negative.   Endo/Heme/Allergies: Negative.   Psychiatric/Behavioral: Negative.     Blood pressure (!) 141/62, pulse 91, temperature (!) 97.4 F (36.3 C), temperature source Oral, resp. rate 18, last menstrual period 03/19/2017, SpO2 100 %.There is no height or weight on file to calculate BMI.  General Appearance: Casual  Eye Contact:  Good  Speech:  Clear and Coherent and Normal Rate  Volume:  Normal  Mood:  Euthymic  Affect:  Congruent  Thought Process:  Goal Directed and Descriptions of Associations: Intact  Orientation:  Full (Time, Place, and Person)  Thought Content:  WDL  Suicidal Thoughts:  No  Homicidal Thoughts:  No  Memory:  Immediate;   Good Recent;   Good Remote;   Good  Judgement:  Good  Insight:  Good  Psychomotor Activity:  Normal  Concentration:  Concentration: Good and Attention Span: Good  Recall:  Good  Fund of Knowledge:  Good  Language:  Good  Akathisia:  No  Handed:  Right  AIMS (if indicated):     Assets:  Communication Skills Desire for Improvement Financial Resources/Insurance Housing Social Support  ADL's:  Intact  Cognition:  WNL  Sleep:  Number of Hours: 6.25     Have you used any form of tobacco in the last 30 days? (Cigarettes, Smokeless Tobacco, Cigars, and/or Pipes): Yes  Has this patient used any form of tobacco in the last 30 days? (Cigarettes, Smokeless Tobacco, Cigars, and/or Pipes) Yes, Yes, A prescription for an FDA-approved tobacco cessation medication was offered at discharge and the patient refused  Blood Alcohol level:  Lab Results  Component Value Date   ETH <10 04/09/2017   ETH <5 64/33/2951    Metabolic Disorder Labs:  No results found  for: HGBA1C, MPG No results found for: PROLACTIN No results found for: CHOL, TRIG, HDL, CHOLHDL, VLDL, LDLCALC  See Psychiatric Specialty Exam and Suicide Risk Assessment completed by Attending Physician prior to discharge.  Discharge destination:  Home  Is patient on multiple antipsychotic therapies at discharge:  No   Has Patient had three or more failed trials of antipsychotic monotherapy by history:  No  Recommended Plan for Multiple Antipsychotic Therapies: NA   Allergies as of 04/15/2017      Reactions   Keflex [cephalexin] Anaphylaxis   Flagyl [metronidazole] Diarrhea      Medication List    STOP taking these medications   buPROPion 150 MG 12 hr tablet Commonly known as:  WELLBUTRIN SR Replaced by:  buPROPion 150 MG 24 hr tablet   clonazePAM  1 MG tablet Commonly known as:  KLONOPIN   cyclobenzaprine 10 MG tablet Commonly known as:  FLEXERIL   gabapentin 600 MG tablet Commonly known as:  NEURONTIN Replaced by:  gabapentin 300 MG capsule   HYDROcodone-acetaminophen 10-325 MG tablet Commonly known as:  NORCO   ketorolac 10 MG tablet Commonly known as:  TORADOL   metroNIDAZOLE 500 MG tablet Commonly known as:  FLAGYL   naproxen 500 MG tablet Commonly known as:  NAPROSYN   ondansetron 4 MG disintegrating tablet Commonly known as:  ZOFRAN ODT   promethazine 25 MG tablet Commonly known as:  PHENERGAN   traMADol 50 MG tablet Commonly known as:  ULTRAM     TAKE these medications     Indication  ARIPiprazole 5 MG tablet Commonly known as:  ABILIFY Take 1 tablet (5 mg total) by mouth daily. For mood control Start taking on:  04/16/2017  Indication:  Schizophrenia, mood stability   beclomethasone 40 MCG/ACT inhaler Commonly known as:  QVAR Inhale 1 puff into the lungs 2 (two) times daily as needed (asthma).  Indication:  Asthma   buPROPion 150 MG 24 hr tablet Commonly known as:  WELLBUTRIN XL Take 1 tablet (150 mg total) by mouth daily. For mood  control Start taking on:  04/16/2017 Replaces:  buPROPion 150 MG 12 hr tablet  Indication:  mood control   gabapentin 300 MG capsule Commonly known as:  NEURONTIN Take 1 capsule (300 mg total) by mouth 3 (three) times daily. Replaces:  gabapentin 600 MG tablet  Indication:  Aggressive Behavior, Agitation, Social Anxiety Disorder   hydrOXYzine 50 MG tablet Commonly known as:  ATARAX/VISTARIL Take 1 tablet (50 mg total) by mouth 3 (three) times daily as needed for anxiety.  Indication:  Feeling Anxious   SUMAtriptan 100 MG tablet Commonly known as:  IMITREX Take 1 tab at onset of migraine.  May repeat in 2 hrs, if needed.  Max dose: 2 tabs/day. This is a 30 day prescription. What changed:  Another medication with the same name was removed. Continue taking this medication, and follow the directions you see here.  Indication:  Migraine Headache   traZODone 50 MG tablet Commonly known as:  DESYREL Take 1 tablet (50 mg total) by mouth at bedtime as needed for sleep.  Indication:  Trouble Sleeping   VENTOLIN HFA 108 (90 Base) MCG/ACT inhaler Generic drug:  albuterol INHALE 2 PUFFS EVERY 4-6HRS AS NEEDED FOR COUGH/WHEEZE MAY USE 2PUFFS 10-20MINS PRIOR TO EXERCISE What changed:  Another medication with the same name was added. Make sure you understand how and when to take each.  Indication:  Asthma   albuterol 108 (90 Base) MCG/ACT inhaler Commonly known as:  VENTOLIN HFA Inhale 1-2 puffs into the lungs every 4 (four) hours as needed for wheezing or shortness of breath. What changed:  You were already taking a medication with the same name, and this prescription was added. Make sure you understand how and when to take each.  Indication:  Asthma      Follow-up Information    [name], MD Follow up on 04/22/2017.   Specialty:  Internal Medicine Why:  Follow up appointment is at [facility], [address] [place], [address].  The appointment is scheduled for March  1st, 2019 at 2:15 with Dr. Mikeal Hawthorne. Contact information: [place] [address] [phone]        Richard Ramos Follow up.   Contact information: [address] [place], [place] [address] Phone: [phone] Fax: [phone]       Valora Piccolo. Go on 04/21/2017.   Why:  Please attend your therapy appt on Thursday, 04/21/17, at 2:00pm. Contact information: [address] [place]  [place] ,  [address] [phone] F: [phone]          Follow-up recommendations:  Continue activity as tolerated. Continue diet as recommended by your PCP. Ensure to keep all appointments with outpatient providers.  Comments:  Patient is instructed prior to discharge to: Take all medications as prescribed by his/her mental healthcare provider. Report any adverse effects and or reactions from the medicines to his/her outpatient provider promptly. Patient has been instructed & cautioned: To not engage in alcohol and or illegal drug use while on prescription medicines. In the event of worsening symptoms, patient is instructed to call the crisis hotline, 911 and or go to the nearest ED for appropriate evaluation and treatment of symptoms. To follow-up with his/her primary care provider for your other medical issues, concerns and or health care needs.    Signed: [place], FNP 04/15/2017, 8:53 AM   Patient seen, Suicide Assessment Completed.  Disposition Plan Reviewed

## 2017-04-18 ENCOUNTER — Other Ambulatory Visit: Payer: Self-pay | Admitting: *Deleted

## 2017-04-18 ENCOUNTER — Telehealth: Payer: Self-pay | Admitting: *Deleted

## 2017-04-18 MED ORDER — SUMATRIPTAN SUCCINATE 6 MG/0.5ML ~~LOC~~ SOAJ: SUBCUTANEOUS | 11 refills | Status: DC

## 2017-04-18 NOTE — Telephone Encounter (Signed)
Received refill request for sumatriptan injections.  Pt also has sumatriptan tablets.  Per vo by Dr. Krista Blue ok to fill both so she can alternate, if needed.  No more than #12 triptans per month in any combination (injections and tablets).  Rx sent to pharmacy.

## 2017-05-17 ENCOUNTER — Telehealth: Payer: Self-pay | Admitting: Neurology

## 2017-05-17 DIAGNOSIS — G4489 Other headache syndrome: Secondary | ICD-10-CM

## 2017-05-17 NOTE — Telephone Encounter (Signed)
Patient's mother is calling to discuss patient having an MRI.

## 2017-05-17 NOTE — Addendum Note (Signed)
Addended by: Noberto Retort C on: 05/17/2017 05:20 PM   Modules accepted: Orders

## 2017-05-17 NOTE — Telephone Encounter (Signed)
Spoke to patient's mother, Carmen Daniels on [place].  Reports patient had a recent manic episode and harmed herself.  She was admitted to [facility] then transferred to [facility] for 19 days.  She is now at home and is scheduled to start outpatient counseling on 05/18/17.  Her mother would like for her to complete her ordered MRI and the patient is agreeable.  Dr. Krista Blue has authorized brain w/wo and new orders have been placed in Epic.    Additionally, she needs to be scheduled for Botox injections.  Her last treatment was 02/10/17.  Since this is a new plan year, I will ask Andee Poles to research her insurance and call to schedule her, once approved.  All appointments should be scheduled through her mother: Carmen Daniels at [phone].

## 2017-05-18 ENCOUNTER — Telehealth: Payer: Self-pay | Admitting: Neurology

## 2017-05-18 NOTE — Telephone Encounter (Signed)
Noted, thank you

## 2017-05-18 NOTE — Telephone Encounter (Signed)
Medicaid order sent to they obtain the auth & will reach out to pt to schedule.

## 2017-05-19 NOTE — Telephone Encounter (Signed)
I called and spoke with Manuela Schwartz to schedule Botox injections.

## 2017-06-01 ENCOUNTER — Other Ambulatory Visit: Payer: Self-pay

## 2017-06-02 ENCOUNTER — Ambulatory Visit
Admission: RE | Admit: 2017-06-02 | Discharge: 2017-06-02 | Disposition: A | Discharge status: Home / Self Care | Payer: Medicaid Other | Source: Ambulatory Visit | Attending: Neurology | Admitting: Neurology

## 2017-06-02 DIAGNOSIS — G4489 Other headache syndrome: Secondary | ICD-10-CM | POA: Diagnosis not present

## 2017-06-02 MED ORDER — GADOBENATE DIMEGLUMINE 529 MG/ML IV SOLN
15.0000 mL | Freq: Once | INTRAVENOUS | Status: AC | PRN
  Administered 2017-06-02: 15 mL via INTRAVENOUS

## 2017-06-06 ENCOUNTER — Telehealth: Payer: Self-pay | Admitting: Neurology

## 2017-06-06 ENCOUNTER — Telehealth: Payer: Self-pay | Admitting: *Deleted

## 2017-06-06 NOTE — Telephone Encounter (Signed)
Telephone note opened in error

## 2017-06-06 NOTE — Telephone Encounter (Signed)
Spoke to patient's mother, Manuela Schwartz on HIPAA - she is aware of results and will discuss further at her 06/29/17 appt.

## 2017-06-06 NOTE — Telephone Encounter (Signed)
Please call patient, MRI brain showed small temporal arachnoid cyst, right greater than left, unchanged compare to 2015 MRI.  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   Will review MRI at her next visit   IMPRESSION: This MRI of the brain with and without contrast shows the following: 1.    Small temporal arachnoid cyst, right greater than left.  They appear unchanged when compared to the 2015 MRI.  They are unlikely to be symptomatic. 2..   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  It is unchanged when compared to the previous MRI.  This is unlikely to be symptomatic. 3.    There is a normal enhancement pattern and there are no acute findings.

## 2017-06-21 ENCOUNTER — Telehealth: Payer: Self-pay | Admitting: Neurology

## 2017-06-21 NOTE — Telephone Encounter (Signed)
Medication is here. DW  [id number] (01-22-18)

## 2017-06-29 ENCOUNTER — Ambulatory Visit: Payer: Medicaid Other | Admitting: Neurology

## 2017-06-29 ENCOUNTER — Encounter

## 2017-06-29 ENCOUNTER — Encounter: Payer: Self-pay | Admitting: Neurology

## 2017-06-29 VITALS — BP 121/76 | HR 77 | Ht 71.0 in | Wt 180.0 lb

## 2017-06-29 DIAGNOSIS — G43719 Chronic migraine without aura, intractable, without status migrainosus: Secondary | ICD-10-CM | POA: Diagnosis not present

## 2017-06-29 NOTE — Progress Notes (Signed)
Chief Complaint  Patient presents with  . Migraine    Botox 100 units x 2 vials - specialty pharmacy.        PATIENT: Carmen Daniels DOB: 08-01-1985  Chief Complaint  Patient presents with  . Migraine    Botox 100 units x 2 vials - specialty pharmacy.       HISTORICAL  Carmen Daniels is a 32 years old right-handed female, accompanied by her mother,, seen in refer by her pain management doctor Dr. Nelva Bush for evaluation of migraines  I have reviewed most recent office note in April 2016, she has past medical history of bipolar disorder, complains of frequent headaches, diffuse body achy pain, UDS was negative,   She complains of migraine headaches since 2011, her typical migraine started with tunnel vision, lasting for 10-15 minutes, followed by severe pounding headache with associated light noise sensitivity, sumatriptan 100 mg as needed has been very helpful, but sometimes she require repeat dosage. She is now having 4-5 typical prolonged migraines each week, lasting for 1 day,  Trigger for her migraines are stress, sleep deprivation,  She has tried over-the-counter aspirin, Tylenol, NSAIDs Excedrin Migraine, without helping her headaches, she has seen different neurologists in the past, has tried different preventive medications, including beta blocker, calcium channel blocker without helping, currently she is taking Topamax 25 mg 7 to 8 tablets every night, which is adjusted by her psychologist Dr. 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 No white matter disease or large vessel/lacunar infarction.  UPDATE Mar 27 2015: She is now taking Topamax 25 mg tablets 8 tablets every night, still has frequent migraine headaches, also consider Botox injection  as migraine prevention, she has variable frequency of migraine, few weeks ago, she had 3 weeks of daily headaches, Imitrex as needed is helpful in 60 minutes, she still has dull achy pain, but no longer has the intense headaches.  used up all 9 tablets of allowed Imitrex each month. She usually has visual aura proceeding each migraine headaches,  She is on polypharmacy for her mood disorder, currently taking Topamax, latuda, seroquel, Xanax, Abilify  UPDATE September 04 2015: She had her first Botox injection as migraine prevention in May 05 2015, she had frequent headache postinjection for 2 weeks, eventually she noticed a benefit, she has much less, less severe migraine, but even with the injection she has 2-3 migraines each week, noticed wearing off at the end of 3 months.  UPDATE Nov 28th 2017: She is having frequent headaches, couple times a week, reported 50% improvement with Botox injection, has much less frequent and less severe headaches. 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   UPDATE Jul 21 2016: She responded well to previous injection on April 21 2016, she did  not notice significant side effect, she reported less headache, headache has also become less intense,  She also tried Toradol IM injection for one prolonged headaches, which helps well for her, she used to her monthly supply of Imitrex tablets,  UPDATE October 05 2016: I reviewed her  [place] controlled substance registry, in July 2018, she got tramadol 60 tablets, hydrocodone 10/Tylenol 60 tablets, Xanax 1 mg 120 tablets, tramadol again 60 tablets,   In June 2018, hydrocodone/Tylenol 10/325 mg 60 tablets, Xanax 1 mg 120 tablets, tramadol 30 tablet hydrocodone syrup 112, tramadol 30 tablets  I also reviewed her ED presentation of August 12th 2018, she slipped and fell forward face down, mild transient loss of consciousness, she had persistent headache, neck pain,nausea vomiting, blurry vision since the injury,  She also hurt her left great toe, with a black and  bruise, complains of pain,  I personally reviewed CT head without contrast August twelfth 2018, which showed no significant abnormality, CT cervical and facial/sinus showed no evidence of fracture  UPDATE Sept 5 2018: She went well with previous Botox injection, did has frequent headaches during her concussion October 03 2016.  UPDATE Feb 10 2017: She responded very well to Botox injection as migraine prevention  UPDATE Jun 29 2017: She was admitted to the hospital for depression suicidal attempt, now with increased migraine headaches,  REVIEW OF SYSTEMS: Full 14 system review of systems performed and notable only for as above  ALLERGIES: Allergies  Allergen Reactions  . Keflex [Cephalexin] Anaphylaxis  . Flagyl [Metronidazole] Diarrhea    HOME MEDICATIONS: Current Outpatient Prescriptions  Medication Sig Dispense Refill  . ALPRAZolam (XANAX) 1 MG tablet Take 1 mg by mouth 4 (four) times daily.  2  . ARIPiprazole (ABILIFY) 30 MG tablet Take 30 mg by mouth daily.  3  . Eszopiclone 3 MG TABS Take 3 mg by mouth at bedtime.  3  . HYDROcodone-acetaminophen (NORCO) 10-325 MG per tablet Take 1 tablet by mouth 2 (two) times daily as needed. for pain  0  . LATUDA 40 MG TABS tablet TAKE 1 TABLET EVERY EVENING WITH FOOD  1  . promethazine (PHENERGAN) 25 MG tablet Take 25 mg by mouth 3 (three) times daily as  needed.  0  . QUEtiapine (SEROQUEL) 25 MG tablet Take 25 mg by mouth at bedtime.  2  . SUMAtriptan (IMITREX) 100 MG tablet TAKE 1 TABLET BY MOUTH. MAY TAKE REPEAT WITH 1 TABLET IN TWO HOURS IF NEEDED  5  . topiramate (TOPAMAX) 25 MG tablet TAKE 7-8 TABLETS AT BEDTIME  0     PAST MEDICAL HISTORY: Past Medical History:  Diagnosis Date  . Allergic rhinitis   . Anxiety   . Asthma   . Back pain   . Bipolar disorder ([place])   . Chronic pain   . Migraines   . Nasal polyps   . Nausea   . Neck pain   . OCD (obsessive compulsive disorder)     PAST SURGICAL HISTORY: Past Surgical History:  Procedure Laterality Date  . NASAL SINUS SURGERY    . WISDOM TOOTH EXTRACTION      FAMILY HISTORY: Family History  Problem Relation Age of Onset  . Healthy Father   . Healthy Mother   . Arthritis Maternal Grandmother   . Arthritis Maternal Grandfather   . Depression Paternal Grandmother     SOCIAL HISTORY:  Social History   Socioeconomic History  . Marital status: Married    Spouse name: Not  on file  . Number of children: 0  . Years of education: 26  . Highest education level: Not on file  Occupational History  . Occupation: Unemployed  Social Needs  . Financial resource strain: Not on file  . Food insecurity:    Worry: Not on file    Inability: Not on file  . Transportation needs:    Medical: Not on file    Non-medical: Not on file  Tobacco Use  . Smoking status: Current Every Day Smoker    Packs/day: 0.50    Types: Cigarettes  . Smokeless tobacco: Never Used  Substance and Sexual Activity  . Alcohol use: Yes    Alcohol/week: 0.0 oz    Comment: Occasional alcohol use  . Drug use: No  . Sexual activity: Yes    Birth control/protection: Condom  Lifestyle  . Physical activity:    Days per week: Not on file    Minutes per session: Not on file  . Stress: Not on file  Relationships  . Social connections:    Talks on phone: Not on file    Gets together: Not on file     Attends religious service: Not on file    Active member of club or organization: Not on file    Attends meetings of clubs or organizations: Not on file    Relationship status: Not on file  . Intimate partner violence:    Fear of current or ex partner: Not on file    Emotionally abused: Not on file    Physically abused: Not on file    Forced sexual activity: Not on file  Other Topics Concern  . Not on file  Social History Narrative   Lives at home alone.   Right-handed.   3 glasses of green tea per day.    PHYSICAL EXAM   Vitals:   06/29/17 1430  BP: 121/76  Pulse: 77  Weight: 180 lb (81.6 kg)  Height: 5\' 11"  (1.803 m)    Not recorded      Body mass index is 25.1 kg/m.  PHYSICAL EXAMNIATION:  Gen: NAD, conversant, well nourised, obese, well groomed                     Cardiovascular: Regular rate rhythm, no peripheral edema, warm, nontender. Eyes: Conjunctivae clear without exudates or hemorrhage Neck: Supple, no carotid bruise. Pulmonary: Clear to auscultation bilaterally   NEUROLOGICAL EXAM:  MMSE - Mini Mental State Exam 10/05/2016  Orientation to time 4  Orientation to Place 5  Registration 3  Attention/ Calculation 5  Recall 1  Language- name 2 objects 2  Language- repeat 1  Language- follow 3 step command 3  Language- read & follow direction 1  Write a sentence 1  Copy design 0  Total score 26  animal naming 6.   CRANIAL NERVES: CN II: Visual fields are full to confrontation. Fundoscopic exam is normal with sharp discs and no vascular changes. Pupils are round equal and briskly reactive to light. CN III, IV, VI: extraocular movement are normal. No ptosis. CN V: Facial sensation is intact to pinprick in all 3 divisions bilaterally. Corneal responses are intact.  CN VII: Face is symmetric with normal eye closure and smile. CN VIII: Hearing is normal to rubbing fingers CN IX, X: Palate elevates symmetrically. Phonation is normal. CN XI: Head turning  and shoulder shrug are intact CN XII: Tongue is midline with normal movements and no atrophy.  MOTOR: There is  no pronator drift of out-stretched arms. Muscle bulk and tone are normal. Muscle strength is normal.  REFLEXES: Reflexes are 2+ and symmetric at the biceps, triceps, knees, and ankles. Plantar responses are flexor.  SENSORY: Intact to light touch, pinprick, position sense, and vibration sense are intact in fingers and toes.  COORDINATION: Rapid alternating movements and fine finger movements are intact. There is no dysmetria on finger-to-nose and heel-knee-shin.    GAIT/STANCE: Antalgic, left big toe subinguinal hematoma     DIAGNOSTIC DATA (LABS, IMAGING, TESTING) - I reviewed patient records, labs, notes, testing and imaging myself where available.   ASSESSMENT AND PLAN  Carmen Daniels is a 32 y.o. female    Chronic migraine headaches with aura, s/p concussion on October 03 2016.   BOTOX injection was performed according to protocol by [employer]. 100 units of BOTOX was dissolved into 2 cc NS.  BOTOX injection was performed according to protocol by [employer]. 100 units of BOTOX was dissolved into 2 cc NS.  BOTOX injection was performed according to protocol by [employer]. 100 units of BOTOX was dissolved into 2 cc NS.        Exta 45 units were injected into bilateral platysmus, masseters  Patient tolerate the injection well. Will return for repeat injection in 3 months.    Abnormal MRI brain in 2015  Evidence of bilateral anterior middle fossa arachnoid cyst,  Patient's and family wants to repeat MRI for comparison orders placed  Marcial Pacas, M.D. Ph.D.  [facility] [address], [place],  [address] Ph: [phone] Fax: [phone]  CC: To Dr. [name], Dr. Nelva Bush, Delfino Lovett

## 2017-06-29 NOTE — Progress Notes (Signed)
**  Botox 100 units x 2 vials, NDC 2897-9150-41, Lot [id number], Exp 06/2019, specialty pharmacy.//mck,rn**

## 2017-08-30 ENCOUNTER — Other Ambulatory Visit: Payer: Self-pay | Admitting: Neurology

## 2017-09-07 ENCOUNTER — Other Ambulatory Visit: Payer: Self-pay | Admitting: Neurology

## 2017-09-16 ENCOUNTER — Telehealth: Payer: Self-pay | Admitting: Neurology

## 2017-09-16 DIAGNOSIS — Z79899 Other long term (current) drug therapy: Secondary | ICD-10-CM

## 2017-09-16 NOTE — Telephone Encounter (Signed)
Pt requesting refills for SUMAtriptan (IMITREX) 100 MG tablet (to [facility])stating she only has 1 tablet left. Pt unclear why the refills is too soon. Please call to advise

## 2017-09-16 NOTE — Telephone Encounter (Signed)
Spoke with Carmen Daniels and explained that she has r/f of Imitrex available, but each r/f needs to last 30 days.  This is for her safety.  It is not safe to take more Imitrex than rx'd.  She verbalized understanding of same./fim

## 2017-09-19 ENCOUNTER — Encounter: Payer: Self-pay | Admitting: *Deleted

## 2017-09-19 NOTE — Telephone Encounter (Signed)
Per [employer] - her coverage will only allow #12 triptan per month.  Sumatriptan injections now require prior authorization.  The PA has been initiated, via phone, with [place] [employer].  The case will be reviewed by the pharmacist and a decision will be given in 24 hours.

## 2017-09-19 NOTE — Telephone Encounter (Signed)
Pt has called re: her SUMAtriptan 6 MG/0.5ML SOAJ.  Pt states she is unable to control how may head aches she gets.  Pt is asking for a call to discuss whatever options are available to her for her heaaches

## 2017-09-19 NOTE — Addendum Note (Signed)
Addended by: Noberto Retort C on: 09/19/2017 05:15 PM   Modules accepted: Orders

## 2017-09-20 ENCOUNTER — Other Ambulatory Visit (INDEPENDENT_AMBULATORY_CARE_PROVIDER_SITE_OTHER): Payer: Self-pay

## 2017-09-20 DIAGNOSIS — Z79899 Other long term (current) drug therapy: Secondary | ICD-10-CM

## 2017-09-20 DIAGNOSIS — Z0289 Encounter for other administrative examinations: Secondary | ICD-10-CM

## 2017-09-20 MED ORDER — SUMATRIPTAN SUCCINATE 100 MG PO TABS: ORAL_TABLET | ORAL | 11 refills | Status: DC

## 2017-09-20 NOTE — Addendum Note (Signed)
Addended by: Desmond Lope on: 09/20/2017 08:29 AM   Modules accepted: Orders

## 2017-09-20 NOTE — Telephone Encounter (Signed)
I spoke with her mother, Carmen Daniels (on [place]) - states Carmen Daniels is sometimes taking 20+ doses of sumatriptan per month (combination of tablets and injections).  All prescriptions at both [facility] and [facility] have been voided with the exception of one:  [facility] - Sum[facility]riptan 100mg  tablets - Take 1 tab at onset of migraine.  May repeat in 2 hrs, if needed.  Max dose: 2 tabs/day. This is a 30 day prescription.  No early refills. #12/30.  Per vo by Dr. Krista Blue, start her on Ajovy 225mg , one injection per month.  She has to have a negative pregnancy test prior to [employer] Medicaid paying for the prescription.  The patient will come to our office today for the the test.  Once a negative result is received, we can send in the prescription and submit it to [facility] for approval.

## 2017-09-20 NOTE — Telephone Encounter (Signed)
New rx for sumatriptan printed, signed and faxed to [facility] at [phone] (instructions too long - causing rx to print).

## 2017-09-21 LAB — PREGNANCY, URINE: Preg Test, Ur: NEGATIVE

## 2017-09-21 NOTE — Telephone Encounter (Signed)
Patient had negative pregnancy test.  PA for Ajovy has been faxed to [facility] Medicaid for review.

## 2017-09-21 NOTE — Telephone Encounter (Signed)
PA approved - valid through 09/15/2018.  SQ#[id number] F.

## 2017-10-12 ENCOUNTER — Other Ambulatory Visit: Payer: Self-pay | Admitting: Neurology

## 2017-10-12 ENCOUNTER — Other Ambulatory Visit: Payer: Self-pay | Admitting: *Deleted

## 2017-10-12 ENCOUNTER — Encounter: Payer: Self-pay | Admitting: Neurology

## 2017-10-12 ENCOUNTER — Ambulatory Visit: Payer: Medicaid Other | Admitting: Neurology

## 2017-10-12 VITALS — BP 124/69 | HR 74 | Ht 71.0 in | Wt 188.0 lb

## 2017-10-12 DIAGNOSIS — G43719 Chronic migraine without aura, intractable, without status migrainosus: Secondary | ICD-10-CM

## 2017-10-12 MED ORDER — CYCLOBENZAPRINE HCL 5 MG PO TABS: 5.0000 mg | ORAL_TABLET | Freq: Three times a day (TID) | ORAL | 3 refills | Status: DC | PRN

## 2017-10-12 MED ORDER — ONDANSETRON 4 MG PO TBDP: 4.0000 mg | ORAL_TABLET | Freq: Three times a day (TID) | ORAL | 6 refills | Status: DC | PRN

## 2017-10-12 MED ORDER — FREMANEZUMAB-VFRM 225 MG/1.5ML ~~LOC~~ SOSY: 225.0000 mg | PREFILLED_SYRINGE | SUBCUTANEOUS | 11 refills | Status: DC

## 2017-10-12 NOTE — Progress Notes (Signed)
Chief Complaint  Patient presents with  . Migraine    Botox 100 units x 2 vials - specialty pharmacy.  She is here with her mother, Arlie Solomons.  She has not started the Ajovy yet.  She had a negative pregnancy test and it has been approved by [facility] Medicaid.  She will pick it up today.      PATIENT: Carmen Daniels DOB: Oct 24, 1985  Chief Complaint  Patient presents with  . Migraine    Botox 100 units x 2 vials - specialty pharmacy.  She is here with her mother, Arlie Solomons.  She has not started the Ajovy yet.  She had a negative pregnancy test and it has been approved by [facility] Medicaid.  She will pick it up today.     HISTORICAL  Carmen Daniels is a 32 years old right-handed female, accompanied by her mother,, seen in refer by her pain management doctor Dr. Nelva Bush for evaluation of migraines  I have reviewed most recent office note in April 2016, she has past medical history of bipolar disorder, complains of frequent headaches, diffuse body achy pain, UDS was negative,   She complains of migraine headaches since 2011, her typical migraine started with tunnel vision, lasting for 10-15 minutes, followed by severe pounding headache with associated light noise sensitivity, sumatriptan 100 mg as needed has been very helpful, but sometimes she require repeat dosage. She is now having 4-5 typical prolonged migraines each week, lasting for 1 day,  Trigger for her migraines are stress, sleep deprivation,  She has tried over-the-counter aspirin, Tylenol, NSAIDs Excedrin Migraine, without helping her headaches, she has seen different neurologists in the past, has tried different preventive medications, including beta blocker, calcium channel blocker without helping, currently she is taking Topamax 25 mg 7 to 8 tablets every night, which is adjusted by her psychologist Dr. 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 No white matter disease or large vessel/lacunar infarction.  UPDATE Mar 27 2015: She is now taking Topamax 25 mg tablets 8 tablets every night, still has frequent migraine headaches, also consider Botox injection as migraine prevention, she has variable frequency of migraine, few weeks ago, she had 3 weeks of daily headaches, Imitrex as needed is helpful in 60 minutes, she still has dull achy pain, but no longer has the intense headaches.  used up all 9 tablets of allowed Imitrex each month. She usually has visual aura proceeding each migraine headaches,  She is on polypharmacy for her mood disorder, currently taking Topamax, latuda, seroquel, Xanax, Abilify  UPDATE September 04 2015: She had her first Botox injection as migraine prevention in May 05 2015, she had frequent headache postinjection for 2 weeks, eventually she noticed a benefit, she has much less, less severe migraine, but even with the injection she has 2-3 migraines each week, noticed wearing off at the end of 3 months.  UPDATE Nov 28th 2017: She is having frequent headaches, couple times a week, reported 50% improvement with Botox injection, has much less frequent and less severe headaches. 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   UPDATE Jul 21 2016: She responded well to previous injection on April 21 2016, she did not notice significant side effect, she reported less headache, headache has also become less intense,  She also tried Toradol IM injection for one prolonged headaches, which helps well for her, she used to her monthly supply of Imitrex tablets,  UPDATE October 05 2016: I reviewed her [place] controlled substance registry, in July 2018, she got tramadol 60 tablets, hydrocodone 10/Tylenol 60 tablets, Xanax 1 mg 120 tablets, tramadol again 60 tablets,   In June 2018, hydrocodone/Tylenol 10/325 mg 60 tablets, Xanax 1 mg 120 tablets, tramadol 30 tablet hydrocodone syrup 112, tramadol 30 tablets  I also reviewed her ED presentation of August 12th 2018, she slipped and fell forward face down, mild transient loss of consciousness, she had persistent headache, neck pain,nausea vomiting, blurry vision since the injury,  She also hurt her left great toe, with a black and  bruise, complains of pain,  I personally reviewed CT head without contrast August twelfth 2018, which showed no significant abnormality, CT cervical and facial/sinus showed no evidence of fracture  UPDATE Sept 5 2018: She went well with previous Botox injection, did has frequent headaches during her concussion October 03 2016.  UPDATE Feb 10 2017: She responded very well to Botox injection as migraine prevention  UPDATE Jun 29 2017: She was admitted to the hospital for depression suicidal attempt, now with increased migraine headaches,  UPDATE October 12 2017: She is now back home with her parents, used up all her allowed imitrex each month.  Insurance company also approved Ajovy for her  REVIEW OF SYSTEMS: Full 14 system review of systems performed and notable only for as above  ALLERGIES: Allergies  Allergen Reactions  . Keflex [Cephalexin] Anaphylaxis  . Flagyl [Metronidazole] Diarrhea    HOME MEDICATIONS: Current Outpatient  Prescriptions  Medication Sig Dispense Refill  . ALPRAZolam (XANAX) 1 MG tablet Take 1 mg by mouth 4 (four) times daily.  2  . ARIPiprazole (ABILIFY) 30 MG tablet Take 30 mg by mouth daily.  3  . Eszopiclone 3 MG TABS Take 3 mg by mouth at bedtime.  3  . HYDROcodone-acetaminophen (NORCO) 10-325 MG per tablet Take 1 tablet by mouth 2 (two) times daily as needed. for pain  0  . LATUDA 40 MG TABS tablet TAKE 1 TABLET EVERY EVENING WITH FOOD  1  . promethazine (PHENERGAN) 25 MG tablet Take 25 mg by mouth 3 (three) times daily as needed.  0  . QUEtiapine (SEROQUEL) 25 MG tablet Take 25 mg by mouth at bedtime.  2  . SUMAtriptan (IMITREX) 100 MG tablet TAKE 1 TABLET BY MOUTH. MAY TAKE REPEAT WITH 1 TABLET IN TWO HOURS IF NEEDED  5  . topiramate (TOPAMAX) 25 MG tablet TAKE 7-8 TABLETS AT BEDTIME  0     PAST MEDICAL HISTORY: Past Medical History:  Diagnosis Date  . Allergic rhinitis   . Anxiety   . Asthma   . Back pain   . Bipolar disorder ([place])   . Chronic pain   . Migraines   . Nasal polyps   . Nausea   .  Neck pain   . OCD (obsessive compulsive disorder)     PAST SURGICAL HISTORY: Past Surgical History:  Procedure Laterality Date  . NASAL SINUS SURGERY    . WISDOM TOOTH EXTRACTION      FAMILY HISTORY: Family History  Problem Relation Age of Onset  . Healthy Father   . Healthy Mother   . Arthritis Maternal Grandmother   . Arthritis Maternal Grandfather   . Depression Paternal Grandmother     SOCIAL HISTORY:  Social History   Socioeconomic History  . Marital status: Married    Spouse name: Not on file  . Number of children: 0  . Years of education: 75  . Highest education level: Not on file  Occupational History  . Occupation: Unemployed  Social Needs  . Financial resource strain: Not on file  . Food insecurity:    Worry: Not on file    Inability: Not on file  . Transportation needs:    Medical: Not on file    Non-medical: Not on file  Tobacco Use  . Smoking  status: Current Every Day Smoker    Packs/day: 0.50    Types: Cigarettes  . Smokeless tobacco: Never Used  Substance and Sexual Activity  . Alcohol use: Yes    Alcohol/week: 0.0 standard drinks    Comment: Occasional alcohol use  . Drug use: No  . Sexual activity: Yes    Birth control/protection: Condom  Lifestyle  . Physical activity:    Days per week: Not on file    Minutes per session: Not on file  . Stress: Not on file  Relationships  . Social connections:    Talks on phone: Not on file    Gets together: Not on file    Attends religious service: Not on file    Active member of club or organization: Not on file    Attends meetings of clubs or organizations: Not on file    Relationship status: Not on file  . Intimate partner violence:    Fear of current or ex partner: Not on file    Emotionally abused: Not on file    Physically abused: Not on file    Forced sexual activity: Not on file  Other Topics Concern  . Not on file  Social History Narrative   Lives at home alone.   Right-handed.   3 glasses of green tea per day.    PHYSICAL EXAM   Vitals:   10/12/17 1415  BP: 124/69  Pulse: 74  Weight: 188 lb (85.3 kg)  Height: 5\' 11"  (1.803 m)    Not recorded      Body mass index is 26.22 kg/m.  PHYSICAL EXAMNIATION:  Gen: NAD, conversant, well nourised, obese, well groomed                     Cardiovascular: Regular rate rhythm, no peripheral edema, warm, nontender. Eyes: Conjunctivae clear without exudates or hemorrhage Neck: Supple, no carotid bruise. Pulmonary: Clear to auscultation bilaterally   NEUROLOGICAL EXAM:  MMSE - Mini Mental State Exam 10/05/2016  Orientation to time 4  Orientation to Place 5  Registration 3  Attention/ Calculation 5  Recall 1  Language- name 2 objects 2  Language- repeat 1  Language- follow 3 step command 3  Language- read & follow direction 1  Write a sentence 1  Copy design 0  Total score 26  animal naming 6.     CRANIAL NERVES: CN II: Visual fields are  full to confrontation. Fundoscopic exam is normal with sharp discs and no vascular changes. Pupils are round equal and briskly reactive to light. CN III, IV, VI: extraocular movement are normal. No ptosis. CN V: Facial sensation is intact to pinprick in all 3 divisions bilaterally. Corneal responses are intact.  CN VII: Face is symmetric with normal eye closure and smile. CN VIII: Hearing is normal to rubbing fingers CN IX, X: Palate elevates symmetrically. Phonation is normal. CN XI: Head turning and shoulder shrug are intact CN XII: Tongue is midline with normal movements and no atrophy.  MOTOR: There is no pronator drift of out-stretched arms. Muscle bulk and tone are normal. Muscle strength is normal.  REFLEXES: Reflexes are 2+ and symmetric at the biceps, triceps, knees, and ankles. Plantar responses are flexor.  SENSORY: Intact to light touch, pinprick, position sense, and vibration sense are intact in fingers and toes.  COORDINATION: Rapid alternating movements and fine finger movements are intact. There is no dysmetria on finger-to-nose and heel-knee-shin.    GAIT/STANCE: Antalgic, left big toe subinguinal hematoma     DIAGNOSTIC DATA (LABS, IMAGING, TESTING) - I reviewed patient records, labs, notes, testing and imaging myself where available.   ASSESSMENT AND PLAN  Carmen Daniels is a 32 y.o. female    Chronic migraine headaches with aura, s/p concussion on October 03 2016.   BOTOX injection was performed according to protocol by [employer]. 100 units of BOTOX was dissolved into 2 cc NS.  BOTOX injection was performed according to protocol by [employer]. 100 units of BOTOX was dissolved into 2 cc NS.  BOTOX injection was performed according to protocol by [employer]. 100 units of BOTOX was dissolved into 2 cc NS.       Skip injection at bilateral corrugate and procereus.  Exta 60 units were injected into bilateral upper  cervical region, bilateral parietal, and the masseters muscles,  Patient tolerate the injection well. Will return for repeat injection in 3 months.    Abnormal MRI brain in 2015  Evidence of bilateral anterior middle fossa arachnoid cyst,  Patient's and family wants to repeat MRI for comparison orders placed  Marcial Pacas, M.D. Ph.D.  [facility] [address] [place], [place] [address] Ph: [phone] Fax: [phone]  CC: To Dr. [name], Dr. Nelva Bush, Delfino Lovett

## 2017-10-12 NOTE — Progress Notes (Signed)
**  Botox 100 units x 2 vials, NDC 3594-0905-02, Lot [id number], Exp 03/2020, specialty pharmacy.//mck,rn**

## 2018-01-11 ENCOUNTER — Encounter: Payer: Self-pay | Admitting: Neurology

## 2018-01-11 ENCOUNTER — Ambulatory Visit (INDEPENDENT_AMBULATORY_CARE_PROVIDER_SITE_OTHER): Payer: Medicaid Other | Admitting: Neurology

## 2018-01-11 VITALS — BP 118/68 | HR 72 | Ht 71.0 in | Wt 186.0 lb

## 2018-01-11 DIAGNOSIS — G43719 Chronic migraine without aura, intractable, without status migrainosus: Secondary | ICD-10-CM

## 2018-01-11 NOTE — Progress Notes (Addendum)
Chief Complaint  Patient presents with  . Migraine    Botox 100 units x 2 vials - specialty pharmacy      PATIENT: Carmen Daniels DOB: [birth year]  Chief Complaint  Patient presents with  . Migraine    Botox 100 units x 2 vials - specialty pharmacy     HISTORICAL  Carmen Daniels is a 32 years old right-handed female, accompanied by her mother,, seen in refer by her pain management doctor Dr. Nelva Bush for evaluation of migraines  I have reviewed most recent office note in April 2016, she has past medical history of bipolar disorder, complains of frequent headaches, diffuse body achy pain, UDS was negative,   She complains of migraine headaches since 2011, her typical migraine started with tunnel vision, lasting for 10-15 minutes, followed by severe pounding headache with associated light noise sensitivity, sumatriptan 100 mg as needed has been very helpful, but sometimes she require repeat dosage. She is now having 4-5 typical prolonged migraines each week, lasting for 1 day,  Trigger for her migraines are stress, sleep deprivation,  She has tried over-the-counter aspirin, Tylenol, NSAIDs Excedrin Migraine, without helping her headaches, she has seen different neurologists in the past, has tried different preventive medications, including beta blocker, calcium channel blocker without helping, currently she is taking Topamax 25 mg 7 to 8 tablets every night, which is adjusted by her psychologist Dr. 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 No white matter disease or large vessel/lacunar infarction.  UPDATE Mar 27 2015: She is now taking Topamax 25 mg tablets 8 tablets every night, still has frequent migraine headaches, also consider Botox injection as  migraine prevention, she has variable frequency of migraine, few weeks ago, she had 3 weeks of daily headaches, Imitrex as needed is helpful in 60 minutes, she still has dull achy pain, but no longer has the intense headaches.  used up all 9 tablets of allowed Imitrex each month. She usually has visual aura proceeding each migraine headaches,  She is on polypharmacy for her mood disorder, currently taking Topamax, latuda, seroquel, Xanax, Abilify  UPDATE September 04 2015: She had her first Botox injection as migraine prevention in May 05 2015, she had frequent headache postinjection for 2 weeks, eventually she noticed a benefit, she has much less, less severe migraine, but even with the injection she has 2-3 migraines each week, noticed wearing off at the end of 3 months.  UPDATE Nov 28th 2017: She is having frequent headaches, couple times a week, reported 50% improvement with Botox injection, has much less frequent and less severe headaches. 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   UPDATE Jul 21 2016: She responded well to previous injection on April 21 2016, she did not notice significant side  effect, she reported less headache, headache has also become less intense,  She also tried Toradol IM injection for one prolonged headaches, which helps well for her, she used to her monthly supply of Imitrex tablets,  UPDATE October 05 2016: I reviewed her [place] controlled substance registry, in July 2018, she got tramadol 60 tablets, hydrocodone 10/Tylenol 60 tablets, Xanax 1 mg 120 tablets, tramadol again 60 tablets,   In June 2018, hydrocodone/Tylenol 10/325 mg 60 tablets, Xanax 1 mg 120 tablets, tramadol 30 tablet hydrocodone syrup 112, tramadol 30 tablets  I also reviewed her ED presentation of August 12th 2018, she slipped and fell forward face down, mild transient loss of consciousness, she had persistent headache, neck pain,nausea vomiting, blurry vision since the injury,  She also hurt her left great toe, with a black and  bruise, complains of pain,  I personally reviewed CT head without contrast August twelfth 2018, which showed no significant abnormality, CT cervical and facial/sinus showed no evidence of fracture  UPDATE Sept 5 2018: She went well with previous Botox injection, did has frequent headaches during her concussion October 03 2016.  UPDATE Feb 10 2017: She responded very well to Botox injection as migraine prevention  UPDATE Jun 29 2017: She was admitted to the hospital for depression suicidal attempt, now with increased migraine headaches,  UPDATE October 12 2017: She is now back home with her parents, used up all her allowed imitrex each month.  Insurance company also approved Ajovy for her  UPDATE Jan 11 2018: She is doing well with Ajovy and BOTOX, has migraine once a week, well controlled by imitrex  REVIEW OF SYSTEMS: Full 14 system review of systems performed and notable only for as above  ALLERGIES: Allergies  Allergen Reactions  . Keflex [Cephalexin] Anaphylaxis  . Flagyl [Metronidazole] Diarrhea    HOME MEDICATIONS: Current Outpatient Prescriptions  Medication Sig Dispense Refill  . ALPRAZolam (XANAX) 1 MG tablet Take 1 mg by mouth 4 (four) times daily.  2  . ARIPiprazole (ABILIFY) 30 MG tablet Take 30 mg by mouth daily.  3  . Eszopiclone 3 MG TABS Take 3 mg by mouth at bedtime.  3  .  HYDROcodone-acetaminophen (NORCO) 10-325 MG per tablet Take 1 tablet by mouth 2 (two) times daily as needed. for pain  0  . LATUDA 40 MG TABS tablet TAKE 1 TABLET EVERY EVENING WITH FOOD  1  . promethazine (PHENERGAN) 25 MG tablet Take 25 mg by mouth 3 (three) times daily as needed.  0  . QUEtiapine (SEROQUEL) 25 MG tablet Take 25 mg by mouth at bedtime.  2  . SUMAtriptan (IMITREX) 100 MG tablet TAKE 1 TABLET BY MOUTH. MAY TAKE REPEAT WITH 1 TABLET IN TWO HOURS IF NEEDED  5  . topiramate (TOPAMAX) 25 MG tablet TAKE 7-8 TABLETS AT BEDTIME  0     PAST MEDICAL HISTORY: Past Medical History:  Diagnosis Date  . Allergic rhinitis   . Anxiety   . Asthma   . Back pain   . Bipolar disorder ([place])   . Chronic pain   . Migraines   . Nasal polyps   . Nausea   . Neck pain   . OCD (obsessive compulsive disorder)     PAST SURGICAL HISTORY: Past Surgical History:  Procedure Laterality Date  . NASAL SINUS SURGERY    . WISDOM TOOTH EXTRACTION      FAMILY HISTORY: Family History  Problem Relation Age of Onset  . Healthy Father   .  Healthy Mother   . Arthritis Maternal Grandmother   . Arthritis Maternal Grandfather   . Depression Paternal Grandmother     SOCIAL HISTORY:  Social History   Socioeconomic History  . Marital status: Married    Spouse name: Not on file  . Number of children: 0  . Years of education: 71  . Highest education level: Not on file  Occupational History  . Occupation: Unemployed  Social Needs  . Financial resource strain: Not on file  . Food insecurity:    Worry: Not on file    Inability: Not on file  . Transportation needs:    Medical: Not on file    Non-medical: Not on file  Tobacco Use  . Smoking status: Current Every Day Smoker    Packs/day: 0.50    Types: Cigarettes  . Smokeless tobacco: Never Used  Substance and Sexual Activity  . Alcohol use: Yes    Alcohol/week: 0.0 standard drinks    Comment: Occasional alcohol use  . Drug use: No  .  Sexual activity: Yes    Birth control/protection: Condom  Lifestyle  . Physical activity:    Days per week: Not on file    Minutes per session: Not on file  . Stress: Not on file  Relationships  . Social connections:    Talks on phone: Not on file    Gets together: Not on file    Attends religious service: Not on file    Active member of club or organization: Not on file    Attends meetings of clubs or organizations: Not on file    Relationship status: Not on file  . Intimate partner violence:    Fear of current or ex partner: Not on file    Emotionally abused: Not on file    Physically abused: Not on file    Forced sexual activity: Not on file  Other Topics Concern  . Not on file  Social History Narrative   Lives at home alone.   Right-handed.   3 glasses of green tea per day.    PHYSICAL EXAM   Vitals:   01/11/18 1442  BP: 118/68  Pulse: 72  Weight: 186 lb (84.4 kg)  Height: 5\' 11"  (1.803 m)    Not recorded      Body mass index is 25.94 kg/m.  PHYSICAL EXAMNIATION:  Gen: NAD, conversant, well nourised, obese, well groomed                     Cardiovascular: Regular rate rhythm, no peripheral edema, warm, nontender. Eyes: Conjunctivae clear without exudates or hemorrhage Neck: Supple, no carotid bruise. Pulmonary: Clear to auscultation bilaterally   NEUROLOGICAL EXAM:  MMSE - Mini Mental State Exam 10/05/2016  Orientation to time 4  Orientation to Place 5  Registration 3  Attention/ Calculation 5  Recall 1  Language- name 2 objects 2  Language- repeat 1  Language- follow 3 step command 3  Language- read & follow direction 1  Write a sentence 1  Copy design 0  Total score 26  animal naming 6.   CRANIAL NERVES: CN II: Visual fields are full to confrontation. Fundoscopic exam is normal with sharp discs and no vascular changes. Pupils are round equal and briskly reactive to light. CN III, IV, VI: extraocular movement are normal. No ptosis. CN V:  Facial sensation is intact to pinprick in all 3 divisions bilaterally. Corneal responses are intact.  CN VII: Face is symmetric with normal  eye closure and smile. CN VIII: Hearing is normal to rubbing fingers CN IX, X: Palate elevates symmetrically. Phonation is normal. CN XI: Head turning and shoulder shrug are intact CN XII: Tongue is midline with normal movements and no atrophy.  MOTOR: There is no pronator drift of out-stretched arms. Muscle bulk and tone are normal. Muscle strength is normal.  REFLEXES: Reflexes are 2+ and symmetric at the biceps, triceps, knees, and ankles. Plantar responses are flexor.  SENSORY: Intact to light touch, pinprick, position sense, and vibration sense are intact in fingers and toes.  COORDINATION: Rapid alternating movements and fine finger movements are intact. There is no dysmetria on finger-to-nose and heel-knee-shin.    GAIT/STANCE: Antalgic, left big toe subinguinal hematoma     DIAGNOSTIC DATA (LABS, IMAGING, TESTING) - I reviewed patient records, labs, notes, testing and imaging myself where available.   ASSESSMENT AND PLAN  DONNETTE MACMULLEN is a 32 y.o. female    Chronic migraine headaches with aura, s/p concussion on October 03 2016.  Patient has been treated with Botox injection as migraine prevention for extended period of time, which definitely has helped her migraine headaches, without Botox injection, she would have prolonged, very frequent migraines, difficult to control with home medications,         Exta 45 units were injected into bilateral masseters muscles,  Patient tolerate the injection well. Will return for repeat injection in 3 months. Continue Ajovy SQ    Marcial Pacas, M.D. Ph.D.  [facility] [address] [place], [place] [address] Ph: [phone] Fax: [phone]  CC: To Dr. [name], Dr. Nelva Bush, Delfino Lovett

## 2018-01-11 NOTE — Progress Notes (Signed)
**  Botox 100 units x 2 vials, NDC 7654-6503-54, Lot [id number], Exp 05/2020, specialty pharmacy.//mck,rn**

## 2018-01-16 ENCOUNTER — Telehealth: Payer: Self-pay | Admitting: *Deleted

## 2018-01-16 NOTE — Telephone Encounter (Signed)
Faxed completed/signed PA Ajovy to [facility] at [phone]. Received fax confirmation. Waiting on determination.

## 2018-01-17 NOTE — Telephone Encounter (Signed)
Called [facility] earlier to check status of determination.  States they did not receive the fax.  The PA was resent with a confirmation received.  I waited several hours prior to calling back again and they told me they still had not received anything from our office.  I completed the PA over the phone and the decision is pending.  CH#[id number].

## 2018-01-18 NOTE — Telephone Encounter (Signed)
PA approved through 01/12/2019.

## 2018-01-25 ENCOUNTER — Ambulatory Visit: Payer: Self-pay | Admitting: Psychiatry

## 2018-01-25 ENCOUNTER — Encounter

## 2018-01-27 ENCOUNTER — Ambulatory Visit: Payer: Self-pay | Admitting: Psychiatry

## 2018-02-09 ENCOUNTER — Ambulatory Visit: Payer: Medicaid Other | Admitting: Psychiatry

## 2018-04-05 ENCOUNTER — Telehealth: Payer: Self-pay | Admitting: Neurology

## 2018-04-05 NOTE — Telephone Encounter (Signed)
I called [facility] at [phone] to request a refill on the patients Botox medication. I spoke with Joseph Art who stated it needs a PA. We went ahead and scheduled delivery for 04/11/18. It will ship out on Monday as long as the PA is completed before then. I called [facility] at [phone] to start a PA request. I could not complete the PA request but I did not have an evaluation of how the patient is responding to Botox injections. There is no clinical documentation in her previous injections notes that she has responded favorably. Please advise.

## 2018-04-05 NOTE — Telephone Encounter (Signed)
Dr. Krista Blue has added the following to the patient's last clinical note:  Patient has been treated with Botox injection as migraine prevention for extended period of time, which definitely has helped her migraine headaches, without Botox injection, she would have prolonged, very frequent migraines, difficult to control with home medications.  She would like to proceed with Botox approval.

## 2018-04-06 NOTE — Telephone Encounter (Signed)
[id number] pending authorization approval. DW

## 2018-04-12 ENCOUNTER — Encounter: Payer: Self-pay | Admitting: Neurology

## 2018-04-12 ENCOUNTER — Ambulatory Visit: Payer: Medicaid Other | Admitting: Neurology

## 2018-04-12 VITALS — BP 121/82 | HR 61 | Ht 71.0 in | Wt 189.5 lb

## 2018-04-12 DIAGNOSIS — G43719 Chronic migraine without aura, intractable, without status migrainosus: Secondary | ICD-10-CM | POA: Diagnosis not present

## 2018-04-12 NOTE — Progress Notes (Signed)
**  Botox 100 units x 2 vials, NDC 4099-2780-04, Lot [id number], Exp 07/2020, specialty pharmacy.//mck,rn**

## 2018-04-12 NOTE — Progress Notes (Signed)
Chief Complaint  Patient presents with  . Migraine    Botox 100 units x 2 vials - specialty pharmacy      PATIENT: Carmen Daniels DOB: [birth year]  Chief Complaint  Patient presents with  . Migraine    Botox 100 units x 2 vials - specialty pharmacy     HISTORICAL  Carmen Daniels is a 33 years old right-handed female, accompanied by her mother,, seen in refer by her pain management doctor Dr. Nelva Bush for evaluation of migraines  I have reviewed most recent office note in April 2016, she has past medical history of bipolar disorder, complains of frequent headaches, diffuse body achy pain, UDS was negative,   She complains of migraine headaches since 2011, her typical migraine started with tunnel vision, lasting for 10-15 minutes, followed by severe pounding headache with associated light noise sensitivity, sumatriptan 100 mg as needed has been very helpful, but sometimes she require repeat dosage. She is now having 4-5 typical prolonged migraines each week, lasting for 1 day,  Trigger for her migraines are stress, sleep deprivation,  She has tried over-the-counter aspirin, Tylenol, NSAIDs Excedrin Migraine, without helping her headaches, she has seen different neurologists in the past, has tried different preventive medications, including beta blocker, calcium channel blocker without helping, currently she is taking Topamax 25 mg 7 to 8 tablets every night, which is adjusted by her psychologist Dr. 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 No white matter disease or large vessel/lacunar infarction.  UPDATE Mar 27 2015: She is now taking Topamax 25 mg tablets 8 tablets every night, still has frequent migraine headaches, also consider Botox injection as  migraine prevention, she has variable frequency of migraine, few weeks ago, she had 3 weeks of daily headaches, Imitrex as needed is helpful in 60 minutes, she still has dull achy pain, but no longer has the intense headaches.  used up all 9 tablets of allowed Imitrex each month. She usually has visual aura proceeding each migraine headaches,  She is on polypharmacy for her mood disorder, currently taking Topamax, latuda, seroquel, Xanax, Abilify  UPDATE September 04 2015: She had her first Botox injection as migraine prevention in May 05 2015, she had frequent headache postinjection for 2 weeks, eventually she noticed a benefit, she has much less, less severe migraine, but even with the injection she has 2-3 migraines each week, noticed wearing off at the end of 3 months.  UPDATE Nov 28th 2017: She is having frequent headaches, couple times a week, reported 50% improvement with Botox injection, has much less frequent and less severe headaches. 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   UPDATE Jul 21 2016: She responded well to previous injection on April 21 2016, she did not notice significant side  effect, she reported less headache, headache has also become less intense,  She also tried Toradol IM injection for one prolonged headaches, which helps well for her, she used to her monthly supply of Imitrex tablets,  UPDATE October 05 2016: I reviewed her [place] controlled substance registry, in July 2018, she got tramadol 60 tablets, hydrocodone 10/Tylenol 60 tablets, Xanax 1 mg 120 tablets, tramadol again 60 tablets,   In June 2018, hydrocodone/Tylenol 10/325 mg 60 tablets, Xanax 1 mg 120 tablets, tramadol 30 tablet hydrocodone syrup 112, tramadol 30 tablets  I also reviewed her ED presentation of August 12th 2018, she slipped and fell forward face down, mild transient loss of consciousness, she had persistent headache, neck pain,nausea vomiting, blurry vision since the injury,  She also hurt her left great toe, with a black and  bruise, complains of pain,  I personally reviewed CT head without contrast August twelfth 2018, which showed no significant abnormality, CT cervical and facial/sinus showed no evidence of fracture  UPDATE Sept 5 2018: She went well with previous Botox injection, did has frequent headaches during her concussion October 03 2016.  UPDATE Feb 10 2017: She responded very well to Botox injection as migraine prevention  UPDATE Jun 29 2017: She was admitted to the hospital for depression suicidal attempt, now with increased migraine headaches,  UPDATE October 12 2017: She is now back home with her parents, used up all her allowed imitrex each month.  Insurance company also approved Ajovy for her  UPDATE Jan 11 2018: She is doing well with Ajovy and BOTOX, has migraine once a week, well controlled by imitrex  UPDATE Apr 12 2018: She responded well to the combination therapy of Ajovy and Botox as migraine prevention,  REVIEW OF SYSTEMS: Full 14 system review of systems performed and notable only for as above  ALLERGIES: Allergies  Allergen Reactions  . Keflex [Cephalexin] Anaphylaxis  . Flagyl [Metronidazole] Diarrhea    HOME MEDICATIONS: Current Outpatient Prescriptions  Medication Sig Dispense Refill  . ALPRAZolam (XANAX) 1 MG tablet Take 1 mg by mouth 4 (four) times daily.  2  . ARIPiprazole (ABILIFY) 30 MG  tablet Take 30 mg by mouth daily.  3  . Eszopiclone 3 MG TABS Take 3 mg by mouth at bedtime.  3  . HYDROcodone-acetaminophen (NORCO) 10-325 MG per tablet Take 1 tablet by mouth 2 (two) times daily as needed. for pain  0  . LATUDA 40 MG TABS tablet TAKE 1 TABLET EVERY EVENING WITH FOOD  1  . promethazine (PHENERGAN) 25 MG tablet Take 25 mg by mouth 3 (three) times daily as needed.  0  . QUEtiapine (SEROQUEL) 25 MG tablet Take 25 mg by mouth at bedtime.  2  . SUMAtriptan (IMITREX) 100 MG tablet TAKE 1 TABLET BY MOUTH. MAY TAKE REPEAT WITH 1 TABLET IN TWO HOURS IF NEEDED  5  . topiramate (TOPAMAX) 25 MG tablet TAKE 7-8 TABLETS AT BEDTIME  0     PAST MEDICAL HISTORY: Past Medical History:  Diagnosis Date  . Allergic rhinitis   . Anxiety   . Asthma   . Back pain   . Bipolar disorder ([place])   . Chronic pain   . Migraines   . Nasal polyps   . Nausea   . Neck pain   . OCD (obsessive compulsive disorder)     PAST SURGICAL HISTORY: Past Surgical History:  Procedure Laterality Date  . NASAL SINUS SURGERY    . WISDOM TOOTH EXTRACTION  FAMILY HISTORY: Family History  Problem Relation Age of Onset  . Healthy Father   . Healthy Mother   . Arthritis Maternal Grandmother   . Arthritis Maternal Grandfather   . Depression Paternal Grandmother     SOCIAL HISTORY:  Social History   Socioeconomic History  . Marital status: Married    Spouse name: Not on file  . Number of children: 0  . Years of education: 41  . Highest education level: Not on file  Occupational History  . Occupation: Unemployed  Social Needs  . Financial resource strain: Not on file  . Food insecurity:    Worry: Not on file    Inability: Not on file  . Transportation needs:    Medical: Not on file    Non-medical: Not on file  Tobacco Use  . Smoking status: Current Every Day Smoker    Packs/day: 0.50    Types: Cigarettes  . Smokeless tobacco: Never Used  Substance and Sexual Activity  . Alcohol use:  Yes    Alcohol/week: 0.0 standard drinks    Comment: Occasional alcohol use  . Drug use: No  . Sexual activity: Yes    Birth control/protection: Condom  Lifestyle  . Physical activity:    Days per week: Not on file    Minutes per session: Not on file  . Stress: Not on file  Relationships  . Social connections:    Talks on phone: Not on file    Gets together: Not on file    Attends religious service: Not on file    Active member of club or organization: Not on file    Attends meetings of clubs or organizations: Not on file    Relationship status: Not on file  . Intimate partner violence:    Fear of current or ex partner: Not on file    Emotionally abused: Not on file    Physically abused: Not on file    Forced sexual activity: Not on file  Other Topics Concern  . Not on file  Social History Narrative   Lives at home alone.   Right-handed.   3 glasses of green tea per day.    PHYSICAL EXAM   Vitals:   04/12/18 1451  BP: 121/82  Pulse: 61  Weight: 189 lb 8 oz (86 kg)  Height: 5\' 11"  (1.803 m)    Not recorded      Body mass index is 26.43 kg/m.  PHYSICAL EXAMNIATION: External tinnitus radiating to the NEUROLOGICAL EXAM:  MMSE - Mini Mental State Exam 10/05/2016  Orientation to time 4  Orientation to Place 5  Registration 3  Attention/ Calculation 5  Recall 1  Language- name 2 objects 2  Language- repeat 1  Language- follow 3 step command 3  Language- read & follow direction 1  Write a sentence 1  Copy design 0  Total score 26  animal naming 6.   CRANIAL NERVES: CN II: Visual fields are full to confrontation. During the conversation continue atenolol pupils are round equal and briskly reactive to light. CN III, IV, VI: extraocular movement are normal. No ptosis. CN V: Facial sensation is intact to pinprick in all 3 divisions bilaterally. Corneal responses are intact.  CN VII: Face is symmetric with normal eye closure and smile. CN VIII: Hearing is  normal to rubbing fingers CN IX, X: Palate elevates symmetrically. Phonation is normal. CN XI: Head turning and shoulder shrug are intact CN XII: Tongue is midline with normal movements and  no atrophy.  MOTOR: Moves 4 extremities without difficulty  COORDINATION: There is no dysmetria noted  GAIT/STANCE: Normal study    DIAGNOSTIC DATA (LABS, IMAGING, TESTING) - I reviewed patient records, labs, notes, testing and imaging myself where available.   ASSESSMENT AND PLAN  Carmen Daniels is a 33 y.o. female    Chronic migraine headaches with aura, s/p concussion on October 03 2016.  Patient has been treated with Botox injection as migraine prevention for extended period of time, which definitely has helped her migraine headaches, without Botox injection, she would have prolonged, very frequent migraines, difficult to control with home medications,         Exta 45 units: 10 units into eac masseters muscles, Extra 25 units were injected into bilateral cervical paraspinals.  Patient tolerate the injection well. Will return for repeat injection in 3 months. Continue Ajovy SQ    Marcial Pacas, M.D. Ph.D.  [facility] [address] [place], [place] [address] Ph: [phone] Fax: [phone]  CC: To Dr. [name], Dr. Nelva Bush, Delfino Lovett

## 2018-04-20 ENCOUNTER — Encounter: Payer: Self-pay | Admitting: Psychiatry

## 2018-04-20 ENCOUNTER — Ambulatory Visit (INDEPENDENT_AMBULATORY_CARE_PROVIDER_SITE_OTHER): Payer: Self-pay | Admitting: Psychiatry

## 2018-04-20 DIAGNOSIS — F422 Mixed obsessional thoughts and acts: Secondary | ICD-10-CM

## 2018-04-20 DIAGNOSIS — F319 Bipolar disorder, unspecified: Secondary | ICD-10-CM

## 2018-04-20 DIAGNOSIS — F4010 Social phobia, unspecified: Secondary | ICD-10-CM

## 2018-04-20 DIAGNOSIS — F4310 Post-traumatic stress disorder, unspecified: Secondary | ICD-10-CM
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
## 2018-04-21 ENCOUNTER — Other Ambulatory Visit: Payer: Self-pay | Admitting: Neurology

## 2018-06-26 ENCOUNTER — Ambulatory Visit: Payer: Medicaid Other | Admitting: Psychiatry

## 2018-06-26 ENCOUNTER — Other Ambulatory Visit: Payer: Self-pay

## 2018-07-02 ENCOUNTER — Other Ambulatory Visit: Payer: Self-pay

## 2018-07-02 ENCOUNTER — Observation Stay (HOSPITAL_COMMUNITY): Payer: Medicaid Other

## 2018-07-02 ENCOUNTER — Encounter (HOSPITAL_COMMUNITY): Payer: Self-pay | Admitting: Emergency Medicine

## 2018-07-02 ENCOUNTER — Emergency Department (HOSPITAL_COMMUNITY): Payer: Medicaid Other

## 2018-07-02 ENCOUNTER — Observation Stay (HOSPITAL_COMMUNITY)
Admission: EM | Admit: 2018-07-02 | Discharge: 2018-07-04 | Disposition: A | Discharge status: Home / Self Care | Payer: Medicaid Other | Attending: Internal Medicine | Admitting: Internal Medicine

## 2018-07-02 DIAGNOSIS — I491 Atrial premature depolarization: Secondary | ICD-10-CM | POA: Insufficient documentation

## 2018-07-02 DIAGNOSIS — F410 Panic disorder [episodic paroxysmal anxiety] without agoraphobia: Secondary | ICD-10-CM | POA: Diagnosis not present

## 2018-07-02 DIAGNOSIS — F17210 Nicotine dependence, cigarettes, uncomplicated: Secondary | ICD-10-CM | POA: Insufficient documentation

## 2018-07-02 DIAGNOSIS — R4182 Altered mental status, unspecified: Secondary | ICD-10-CM | POA: Diagnosis not present

## 2018-07-02 DIAGNOSIS — F319 Bipolar disorder, unspecified: Secondary | ICD-10-CM | POA: Diagnosis not present

## 2018-07-02 DIAGNOSIS — F429 Obsessive-compulsive disorder, unspecified: Secondary | ICD-10-CM | POA: Insufficient documentation

## 2018-07-02 DIAGNOSIS — R000 Tachycardia, unspecified: Secondary | ICD-10-CM | POA: Insufficient documentation

## 2018-07-02 DIAGNOSIS — J019 Acute sinusitis, unspecified: Secondary | ICD-10-CM | POA: Diagnosis not present

## 2018-07-02 DIAGNOSIS — R55 Syncope and collapse: Secondary | ICD-10-CM | POA: Diagnosis not present

## 2018-07-02 DIAGNOSIS — G8929 Other chronic pain: Secondary | ICD-10-CM | POA: Insufficient documentation

## 2018-07-02 DIAGNOSIS — R9431 Abnormal electrocardiogram [ECG] [EKG]: Secondary | ICD-10-CM | POA: Diagnosis present

## 2018-07-02 DIAGNOSIS — F603 Borderline personality disorder: Secondary | ICD-10-CM | POA: Insufficient documentation

## 2018-07-02 DIAGNOSIS — Z7951 Long term (current) use of inhaled steroids: Secondary | ICD-10-CM | POA: Insufficient documentation

## 2018-07-02 DIAGNOSIS — J4530 Mild persistent asthma, uncomplicated: Secondary | ICD-10-CM | POA: Insufficient documentation

## 2018-07-02 DIAGNOSIS — R17 Unspecified jaundice: Secondary | ICD-10-CM

## 2018-07-02 DIAGNOSIS — G43709 Chronic migraine without aura, not intractable, without status migrainosus: Secondary | ICD-10-CM | POA: Insufficient documentation

## 2018-07-02 DIAGNOSIS — Z1159 Encounter for screening for other viral diseases: Secondary | ICD-10-CM | POA: Insufficient documentation

## 2018-07-02 DIAGNOSIS — Z79899 Other long term (current) drug therapy: Secondary | ICD-10-CM | POA: Insufficient documentation

## 2018-07-02 DIAGNOSIS — F419 Anxiety disorder, unspecified: Secondary | ICD-10-CM | POA: Diagnosis not present

## 2018-07-02 DIAGNOSIS — B370 Candidal stomatitis: Secondary | ICD-10-CM | POA: Insufficient documentation

## 2018-07-02 LAB — URINALYSIS, ROUTINE W REFLEX MICROSCOPIC
Bilirubin Urine: NEGATIVE
Glucose, UA: NEGATIVE mg/dL
Hgb urine dipstick: NEGATIVE
Ketones, ur: 5 mg/dL — AB
Leukocytes,Ua: NEGATIVE
Nitrite: NEGATIVE
Protein, ur: NEGATIVE mg/dL
Specific Gravity, Urine: 1.004 — ABNORMAL LOW (ref 1.005–1.030)
pH: 7 (ref 5.0–8.0)

## 2018-07-02 LAB — CBC WITH DIFFERENTIAL/PLATELET
Abs Immature Granulocytes: 0.03 10*3/uL (ref 0.00–0.07)
Basophils Absolute: 0 10*3/uL (ref 0.0–0.1)
Basophils Relative: 0 %
Eosinophils Absolute: 0 10*3/uL (ref 0.0–0.5)
Eosinophils Relative: 0 %
HCT: 39.1 % (ref 36.0–46.0)
Hemoglobin: 13.3 g/dL (ref 12.0–15.0)
Immature Granulocytes: 0 %
Lymphocytes Relative: 8 %
Lymphs Abs: 0.6 10*3/uL — ABNORMAL LOW (ref 0.7–4.0)
MCH: 30 pg (ref 26.0–34.0)
MCHC: 34 g/dL (ref 30.0–36.0)
MCV: 88.1 fL (ref 80.0–100.0)
Monocytes Absolute: 0.3 10*3/uL (ref 0.1–1.0)
Monocytes Relative: 4 %
Neutro Abs: 6.6 10*3/uL (ref 1.7–7.7)
Neutrophils Relative %: 88 %
Platelets: 235 10*3/uL (ref 150–400)
RBC: 4.44 MIL/uL (ref 3.87–5.11)
RDW: 11.8 % (ref 11.5–15.5)
WBC: 7.6 10*3/uL (ref 4.0–10.5)
nRBC: 0 % (ref 0.0–0.2)

## 2018-07-02 LAB — COMPREHENSIVE METABOLIC PANEL
ALT: 29 U/L (ref 0–44)
AST: 19 U/L (ref 15–41)
Albumin: 4.5 g/dL (ref 3.5–5.0)
Alkaline Phosphatase: 53 U/L (ref 38–126)
Anion gap: 10 (ref 5–15)
BUN: 12 mg/dL (ref 6–20)
CO2: 24 mmol/L (ref 22–32)
Calcium: 9.7 mg/dL (ref 8.9–10.3)
Chloride: 105 mmol/L (ref 98–111)
Creatinine, Ser: 0.89 mg/dL (ref 0.44–1.00)
GFR calc Af Amer: >60 mL/min (ref >60)
GFR calc non Af Amer: >60 mL/min (ref >60)
Glucose, Bld: 115 mg/dL — ABNORMAL HIGH (ref 70–99)
Potassium: 4.1 mmol/L (ref 3.5–5.1)
Sodium: 139 mmol/L (ref 135–145)
Total Bilirubin: 3.3 mg/dL — ABNORMAL HIGH (ref 0.3–1.2)
Total Protein: 7.4 g/dL (ref 6.5–8.1)

## 2018-07-02 LAB — RAPID URINE DRUG SCREEN, HOSP PERFORMED
Amphetamines: NOT DETECTED
Barbiturates: NOT DETECTED
Benzodiazepines: NOT DETECTED
Cocaine: NOT DETECTED
Opiates: NOT DETECTED
Tetrahydrocannabinol: POSITIVE — AB

## 2018-07-02 LAB — SALICYLATE LEVEL: Salicylate Lvl: <7 mg/dL (ref 2.8–30.0)

## 2018-07-02 LAB — ACETAMINOPHEN LEVEL: Acetaminophen (Tylenol), Serum: 14 ug/mL (ref 10–30)

## 2018-07-02 LAB — ETHANOL: Alcohol, Ethyl (B): <10 mg/dL (ref <10)

## 2018-07-02 LAB — SARS CORONAVIRUS 2 BY RT PCR (HOSPITAL ORDER, PERFORMED IN ~~LOC~~ HOSPITAL LAB): SARS Coronavirus 2: NEGATIVE

## 2018-07-02 MED ORDER — SODIUM CHLORIDE 0.9 % IV BOLUS
1000.0000 mL | Freq: Once | INTRAVENOUS | Status: AC
  Administered 2018-07-02: 1000 mL via INTRAVENOUS

## 2018-07-02 NOTE — ED Triage Notes (Signed)
[facility]- pt here for anxiety attack. Pt states she has been stressed out due to ex-husband moving back in with her. Pt takes meds for anxiety. EMS reports possibly taking too much meds.  VSS stable

## 2018-07-02 NOTE — Progress Notes (Signed)
Attempted to call for pt to come down for ED. Unable to reach RN. @ 23:23

## 2018-07-02 NOTE — H&P (Addendum)
History and Physical    PATRICK SOHM XIP:[id number] DOB: 09/08/1985 DOA: 07/02/2018  PCP: [name], MD Patient coming from: Home  Chief Complaint: Anxiety, altered mental status  HPI: Carmen Daniels is a 33 y.o. female with medical history significant of anxiety, bipolar disorder, OCD, asthma, allergic rhinitis, chronic back pain, migraines presenting to the hospital complaining of anxiety and altered mental status.  When asked what brought patient to the hospital she replied " I don't know, I think I had a concussion."  States yesterday she passed out 3 times as she was feeling so lightheaded.  She is not sure if she was having any chest pain.  She thinks she may have been short of breath as her "breathing was a little funny."  She thinks she was having heart palpitations.  Denies family history of sudden cardiac death.  States she takes CBD oil for insomnia and chronic pain but does not smoke marijuana.  States yesterday she had a panic attack and took a much higher dose of CBD oil.  She also used her inhalers at that time.  States she is having pain and pressure in her sinuses and has a history of sinusitis and previous sinus surgery.  She is having green-colored nasal discharge.  States her psychiatrist Dr. Toy Care increased the dose of lithium a few weeks ago and since then she has been feeling lightheaded.  States she stopped taking Abilify and trazodone a year ago.  She takes Flexeril for chronic back and neck pain.  Patient seemed forgetful and a few times stated " I'm sorry what did you say a minute ago?" and "can you please repeat what you just said."  No additional history could be obtained at this time.  Per ED provider conversation with patient's sister: "I spoke with her sister, Dr. Emeline Darling who is an IM physician at [facility]. She states that he just spoke with her sister and this is very unusual behavior from her.  She states that she is incoherent and not making sense.  This is not  her baseline mental status. She is concerned because patient stated that she had four syncopal episodes today. She does report that patient has been having cough, sore throat for a few days now.  She is unsure if this is why she was using her inhaler or not.  She wanted to inform us that she was having URI symptoms in case there were certain precautions that needed to be taken given coronavirus pandemic.  She reports that her sister did have a fever a few days ago, but not yesterday or today.  She also notes that her sister has been using CBD oil.  She is unsure if maybe she took too much or if this is relevant, but wanted to let us know this information and further history."  ED Course: Afebrile.  Tachycardic on arrival but subsequent heart rate readings normal.  Not tachypneic.  Blood pressure stable.  Blood glucose 115.  No leukocytosis.  COVID-19 rapid test negative.  T bili 3.3, remainder of LFTs normal.  Blood ethanol level less than 10.  Salicylate level negative.  Acetaminophen level 14.  UDS pending.  UA not suggestive of infection.  Chest x-ray not suggestive of pneumonia.  Head CT negative for acute finding.  EKG showing QTc prolongation.  Review of Systems: As per HPI otherwise 10 point review of systems negative.  Past Medical History:  Diagnosis Date  . Allergic rhinitis   . Anxiety   .  Asthma   . Back pain   . Bipolar disorder ([place])   . Chronic pain   . Migraines   . Nasal polyps   . Nausea   . Neck pain   . OCD (obsessive compulsive disorder)     Past Surgical History:  Procedure Laterality Date  . NASAL SINUS SURGERY    . WISDOM TOOTH EXTRACTION       reports that she has been smoking cigarettes. She has been smoking about 0.50 packs per day. She has never used smokeless tobacco. She reports current alcohol use. She reports that she does not use drugs.  Allergies  Allergen Reactions  . Keflex [Cephalexin] Anaphylaxis  . Flagyl [Metronidazole] Diarrhea    Family  History  Problem Relation Age of Onset  . Healthy Father   . Healthy Mother   . Arthritis Maternal Grandmother   . Arthritis Maternal Grandfather   . Depression Paternal Grandmother     Prior to Admission medications   Medication Sig Start Date End Date Taking? Authorizing Provider  albuterol (VENTOLIN HFA) 108 (90 Base) MCG/ACT inhaler Inhale 1-2 puffs into the lungs every 4 (four) hours as needed for wheezing or shortness of breath. 04/15/17   Money, Lowry Ram, FNP  ARIPiprazole (ABILIFY) 5 MG tablet Take 1 tablet (5 mg total) by mouth daily. For mood control 04/16/17   Money, Lowry Ram, FNP  beclomethasone (QVAR) 40 MCG/ACT inhaler Inhale 1 puff into the lungs 2 (two) times daily as needed (asthma).     [provider]  BOTOX 100 units SOLR injection MD TO INJECT 155 UNITS INTRAMUSCULARLY INTO HEAD AND NECK MUSCLES EVERY 3 MONTHS BY PROVIDER 04/21/18   Marcial Pacas, MD  cyclobenzaprine (FLEXERIL) 5 MG tablet Take 1 tablet (5 mg total) by mouth every 8 (eight) hours as needed for muscle spasms. 10/12/17   Marcial Pacas, MD  Fremanezumab-vfrm (AJOVY) 225 MG/1.5ML SOSY Inject 225 mg into the skin every 30 (thirty) days. 10/12/17   Marcial Pacas, MD  gabapentin (NEURONTIN) 300 MG capsule Take 1 capsule (300 mg total) by mouth 3 (three) times daily. 04/15/17   Money, Lowry Ram, FNP  hydrOXYzine (ATARAX/VISTARIL) 50 MG tablet Take 1 tablet (50 mg total) by mouth 3 (three) times daily as needed for anxiety. 04/15/17   Money, Lowry Ram, FNP  ondansetron (ZOFRAN ODT) 4 MG disintegrating tablet Take 1 tablet (4 mg total) by mouth every 8 (eight) hours as needed. 10/12/17   Marcial Pacas, MD  SUMAtriptan (IMITREX) 100 MG tablet Take 1 tab at onset of migraine.  May repeat in 2 hrs, if needed.  Max dose: 2 tabs/day or 3 per week. This is a 30 day prescription. No early refills. 09/20/17   Marcial Pacas, MD  traZODone (DESYREL) 50 MG tablet Take 1 tablet (50 mg total) by mouth at bedtime as needed for sleep. 04/15/17    Money, Lowry Ram, FNP  VENTOLIN HFA 108 (90 BASE) MCG/ACT inhaler INHALE 2 PUFFS EVERY 4-6HRS AS NEEDED FOR COUGH/WHEEZE MAY USE 2PUFFS 10-20MINS PRIOR TO EXERCISE 01/09/15   Charlies Silvers, MD    Physical Exam: Vitals:   07/03/18 0051 07/03/18 0100 07/03/18 0130 07/03/18 0133  BP: 125/72 100/79    Pulse:  (!) 110 67   Resp:  18  18  Temp:    97.7 F (36.5 C)  TempSrc:    Oral  SpO2:  95% 100% 95%  Weight:    79.3 kg  Height:    5\' 11"  (1.803  m)    Physical Exam  Constitutional: She is oriented to person, place, and time. She appears well-developed and well-nourished. No distress.  HENT:  Head: Normocephalic.  Moist mucous membranes Sinuses mildly tender to palpation  Eyes: Right eye exhibits no discharge. Left eye exhibits no discharge.  Neck: Neck supple.  Cardiovascular: Normal rate, regular rhythm and intact distal pulses.  Pulmonary/Chest: Effort normal and breath sounds normal. No respiratory distress. She has no wheezes. She has no rales.  Abdominal: Soft. Bowel sounds are normal. She exhibits no distension. There is no abdominal tenderness. There is no guarding.  Musculoskeletal:        General: No edema.  Neurological: She is alert and oriented to person, place, and time. No cranial nerve deficit.  Strength 5 out of 5 in bilateral upper and lower extremities. Sensation to light touch intact throughout.  Skin: Skin is warm and dry. She is not diaphoretic.  Psychiatric:  Appears forgetful     Labs on Admission: I have personally reviewed following labs and imaging studies  CBC: Recent Labs  Lab 07/02/18 2110  WBC 7.6  NEUTROABS 6.6  HGB 13.3  HCT 39.1  MCV 88.1  PLT 268   Basic Metabolic Panel: Recent Labs  Lab 07/02/18 2110  NA 139  K 4.1  CL 105  CO2 24  GLUCOSE 115*  BUN 12  CREATININE 0.89  CALCIUM 9.7   GFR: Estimated Creatinine Clearance: 99.5 mL/min (by C-G formula based on SCr of 0.89 mg/dL). Liver Function Tests: Recent Labs  Lab  07/02/18 2110  AST 19  ALT 29  ALKPHOS 53  BILITOT 3.3*  PROT 7.4  ALBUMIN 4.5   No results for input(s): LIPASE, AMYLASE in the last 168 hours. No results for input(s): AMMONIA in the last 168 hours. Coagulation Profile: No results for input(s): INR, PROTIME in the last 168 hours. Cardiac Enzymes: No results for input(s): CKTOTAL, CKMB, CKMBINDEX, TROPONINI in the last 168 hours. BNP (last 3 results) No results for input(s): PROBNP in the last 8760 hours. HbA1C: No results for input(s): HGBA1C in the last 72 hours. CBG: No results for input(s): GLUCAP in the last 168 hours. Lipid Profile: No results for input(s): CHOL, HDL, LDLCALC, TRIG, CHOLHDL, LDLDIRECT in the last 72 hours. Thyroid Function Tests: No results for input(s): TSH, T4TOTAL, FREET4, T3FREE, THYROIDAB in the last 72 hours. Anemia Panel: No results for input(s): VITAMINB12, FOLATE, FERRITIN, TIBC, IRON, RETICCTPCT in the last 72 hours. Urine analysis:    Component Value Date/Time   COLORURINE STRAW (A) 07/02/2018 2302   APPEARANCEUR CLEAR 07/02/2018 2302   LABSPEC 1.004 (L) 07/02/2018 2302   PHURINE 7.0 07/02/2018 2302   GLUCOSEU NEGATIVE 07/02/2018 2302   HGBUR NEGATIVE 07/02/2018 2302   BILIRUBINUR NEGATIVE 07/02/2018 2302   KETONESUR 5 (A) 07/02/2018 2302   PROTEINUR NEGATIVE 07/02/2018 2302   UROBILINOGEN 0.2 06/14/2013 1513   NITRITE NEGATIVE 07/02/2018 2302   LEUKOCYTESUR NEGATIVE 07/02/2018 2302    Radiological Exams on Admission: Ct Head Wo Contrast  Result Date: 07/02/2018 CLINICAL DATA:  Confusion, anxiety EXAM: CT HEAD WITHOUT CONTRAST TECHNIQUE: Contiguous axial images were obtained from the base of the skull through the vertex without intravenous contrast. COMPARISON:  10/03/2016 FINDINGS: Brain: No evidence of acute infarction, hemorrhage, hydrocephalus, extra-axial collection or mass lesion/mass effect. Vascular: No hyperdense vessel or unexpected calcification. Skull: Normal. Negative for  fracture or focal lesion. Sinuses/Orbits: No acute finding. Other: None. IMPRESSION: No acute intracranial pathology. Electronically Signed   By:  Eddie Candle M.D.   On: 07/02/2018 20:05   Mr Brain Wo Contrast  Result Date: 07/03/2018 CLINICAL DATA:  Anxiety and confusion EXAM: MRI HEAD WITHOUT CONTRAST TECHNIQUE: Multiplanar, multiecho pulse sequences of the brain and surrounding structures were obtained without intravenous contrast. COMPARISON:  06/02/2017 brain MRI FINDINGS: BRAIN: There is no acute infarct, acute hemorrhage or extra-axial collection. The midline structures are normal. No midline shift or other mass effect. The white matter signal is normal for the patient's age. The cerebral and cerebellar volume are age-appropriate. No hydrocephalus. Susceptibility-sensitive sequences show no chronic microhemorrhage or superficial siderosis. No mass lesion. VASCULAR: The major intracranial arterial and venous sinus flow voids are normal. SKULL AND UPPER CERVICAL SPINE: Calvarial bone marrow signal is normal. There is no skull base mass. Visualized upper cervical spine and soft tissues are normal. SINUSES/ORBITS: No fluid levels or advanced mucosal thickening. No mastoid or middle ear effusion. The orbits are normal. IMPRESSION: Normal brain MRI. Electronically Signed   By: Ulyses Jarred M.D.   On: 07/03/2018 00:36   Dg Chest Portable 1 View  Result Date: 07/02/2018 CLINICAL DATA:  33 year old female with confusion. EXAM: PORTABLE CHEST 1 VIEW COMPARISON:  Chest radiograph dated 11/24/2014 FINDINGS: The heart size and mediastinal contours are within normal limits. Both lungs are clear. The visualized skeletal structures are unremarkable. IMPRESSION: No active disease. Electronically Signed   By: Anner Crete M.D.   On: 07/02/2018 22:36    EKG: Independently reviewed.  Sinus tachycardia (heart rate 116), QTc 605.  Assessment/Plan Principal Problem:   Syncope Active Problems:   QT prolongation    AMS (altered mental status)   Serum total bilirubin elevated   Acute rhinosinusitis   High risk syncope, prolonged QTc Patient had 4 episodes of syncope today.  EKG showing sinus rhythm and QTC 605.  Suspect QTC prolongation is due to lithium use.  Patient states her psychiatrist had increased the dose of lithium a few weeks ago.  No infectious signs or symptoms. -Cardiac monitoring -Echocardiogram  -Keep potassium greater than 4 and magnesium greater than 2 -Continue to monitor potassium and magnesium levels -Check orthostatics -Consider EEG if other work-up negative -Carotid stenosis less likely given patient's young age and lack of significant risk factors. -Hold QT prolonging drugs  Altered mental status Patient is not somnolent or disoriented.  Appears forgetful on exam.  Suspect secondary to high-dose CBD oil use.  Flexeril could also possibly be contributing.  UDS positive for THC.  Afebrile and no leukocytosis.  Not hypoglycemic. Blood ethanol level less than 10.  Salicylate level negative.  Acetaminophen level 14 (within normal range).  No focal neuro deficit on exam.  Head CT negative for acute finding.  Brain MRI normal. -Continue to monitor mental status  Elevated T bili T bili 3.3, remainder of LFTs normal.  T bili was normal on previous labs from a year ago.  Abdominal exam benign. -Fractionated bilirubin  Acute rhinosinusitis Patient is complaining of pain/pressure in her sinuses and purulent nasal discharge.  Sinuses mildly tender to palpation on exam.  Afebrile and no leukocytosis.  CT head and brain MRI showing no acute finding of sinuses.  -Symptomatic management: Flonase nasal spray, nasal saline irrigation, Tylenol PRN  Asthma Stable.  No bronchospasm. -Hold albuterol at this time given QT prolongation  Bipolar disorder Patient is currently on lithium and the dose was increased by her psychiatrist a few weeks ago.  Now has significant QT prolongation. -Hold  lithium -Please discuss with  her psychiatrist Dr. Toy Care in the morning.  DVT prophylaxis: Lovenox Code Status: Full code Disposition Plan: Anticipate discharge after clinical improvement. Consults called: None Admission status: Observation, telemetry  This chart was dictated using voice recognition software.  Despite best efforts to proofread, errors can occur which can change the documentation meaning.  Carmen Leff MD [facility] Pager [phone]  If 7PM-7AM, please contact night-coverage [email or web address] Password [facility]  07/03/2018, 2:34 AM

## 2018-07-02 NOTE — ED Notes (Signed)
Sister called and said that patient might be a Covid patient. Emeline Darling MD wants to speak to someone to make them aware. [phone]

## 2018-07-02 NOTE — ED Provider Notes (Signed)
[facility] EMERGENCY DEPARTMENT Provider Note   CSN: [id number] Arrival date & time: 07/02/18  1906    History   Chief Complaint No chief complaint on file.   HPI Carmen Daniels is a 33 y.o. female.     The history is provided by the patient, medical records and a relative. No language interpreter was used.   Carmen Daniels is a 33 y.o. female  with a PMH as listed below who presents to the Emergency Department for evaluation of anxiety and mental status change.  Patient is not the greatest historian.  When asked wh while she was in the emergency department today, she states "I took too much of my puffer.  I used for puffs of my inhaler instead of just 1" I asked her why she used her inhaler and she responded "I am sorry her words are not making sense to me.  I just cannot put them together.  I do not understand."  She will frequently begin to answer my questions and then stop, stating "what were you asking?"  or " what was a question?"  She states that she did feel like she was having an anxiety attack which is why she used her inhaler in the first place.  She had some nausea with this.  She states that her whole body "feels like Jell-O".  She reports falling 3 times today and believes that she hit her head.  Unsure about LOC.  Denies any illicit drug use.  Took her home medications as directed today other than increasing her inhaler amount.  I spoke with her sister, Dr. Emeline Darling who is an IM physician at [facility]. She states that he just spoke with her sister and this is very unusual behavior from her.  She states that she is incoherent and not making sense.  This is not her baseline mental status. She is concerned because patient stated that she had four syncopal episodes today. She does report that patient has been having cough, sore throat for a few days now.  She is unsure if this is why she was using her inhaler or not.  She wanted to inform us that she was  having URI symptoms in case there were certain precautions that needed to be taken given coronavirus pandemic.  She reports that her sister did have a fever a few days ago, but not yesterday or today.  She also notes that her sister has been using CBD oil.  She is unsure if maybe she took too much or if this is relevant, but wanted to let us know this information and further history.  Level V caveat applies 2/2 mental status change.    Past Medical History:  Diagnosis Date  . Allergic rhinitis   . Anxiety   . Asthma   . Back pain   . Bipolar disorder ([place])   . Chronic pain   . Migraines   . Nasal polyps   . Nausea   . Neck pain   . OCD (obsessive compulsive disorder)     Patient Active Problem List   Diagnosis Date Noted  . Syncope 07/02/2018  . Sinus congestion 04/13/2017  . Borderline personality disorder ([place]) 04/11/2017  . Bipolar I disorder, current or most recent episode manic, with psychotic features ([place]) 04/10/2017  . Bipolar disorder ([place]) 04/10/2017  . Abnormal MRI of head 04/21/2016  . Chronic migraine without aura 03/27/2015  . Mild persistent asthma 12/30/2014  . Allergic rhinitis  due to pollen 12/30/2014  . 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 Nasal polyposis 12/30/2014    Past Surgical History:  Procedure Laterality Date  . NASAL SINUS SURGERY    . WISDOM TOOTH EXTRACTION       OB History   No obstetric history on file.      Home Medications    Prior to Admission medications   Medication Sig Start Date End Date Taking? Authorizing Provider  albuterol (VENTOLIN HFA) 108 (90 Base) MCG/ACT inhaler Inhale 1-2 puffs into the lungs every 4 (four) hours as needed for wheezing or shortness of breath. 04/15/17   Money, Lowry Ram, FNP  ARIPiprazole (ABILIFY) 5 MG tablet Take 1 tablet (5 mg total) by mouth daily. For mood control 04/16/17   Money, Lowry Ram, FNP  beclomethasone (QVAR) 40 MCG/ACT inhaler Inhale 1 puff into the lungs 2 (two) times daily as needed  (asthma).     [provider]  BOTOX 100 units SOLR injection MD TO INJECT 155 UNITS INTRAMUSCULARLY INTO HEAD AND NECK MUSCLES EVERY 3 MONTHS BY PROVIDER 04/21/18   Marcial Pacas, MD  cyclobenzaprine (FLEXERIL) 5 MG tablet Take 1 tablet (5 mg total) by mouth every 8 (eight) hours as needed for muscle spasms. 10/12/17   Marcial Pacas, MD  Fremanezumab-vfrm (AJOVY) 225 MG/1.5ML SOSY Inject 225 mg into the skin every 30 (thirty) days. 10/12/17   Marcial Pacas, MD  gabapentin (NEURONTIN) 300 MG capsule Take 1 capsule (300 mg total) by mouth 3 (three) times daily. 04/15/17   Money, Lowry Ram, FNP  hydrOXYzine (ATARAX/VISTARIL) 50 MG tablet Take 1 tablet (50 mg total) by mouth 3 (three) times daily as needed for anxiety. 04/15/17   Money, Lowry Ram, FNP  ondansetron (ZOFRAN ODT) 4 MG disintegrating tablet Take 1 tablet (4 mg total) by mouth every 8 (eight) hours as needed. 10/12/17   Marcial Pacas, MD  SUMAtriptan (IMITREX) 100 MG tablet Take 1 tab at onset of migraine.  May repeat in 2 hrs, if needed.  Max dose: 2 tabs/day or 3 per week. This is a 30 day prescription. No early refills. 09/20/17   Marcial Pacas, MD  traZODone (DESYREL) 50 MG tablet Take 1 tablet (50 mg total) by mouth at bedtime as needed for sleep. 04/15/17   Money, Lowry Ram, FNP  VENTOLIN HFA 108 (90 BASE) MCG/ACT inhaler INHALE 2 PUFFS EVERY 4-6HRS AS NEEDED FOR COUGH/WHEEZE MAY USE 2PUFFS 10-20MINS PRIOR TO EXERCISE 01/09/15   Charlies Silvers, MD    Family History Family History  Problem Relation Age of Onset  . Healthy Father   . Healthy Mother   . Arthritis Maternal Grandmother   . Arthritis Maternal Grandfather   . Depression Paternal Grandmother     Social History Social History   Tobacco Use  . Smoking status: Current Every Day Smoker    Packs/day: 0.50    Types: Cigarettes  . Smokeless tobacco: Never Used  Substance Use Topics  . Alcohol use: Yes    Alcohol/week: 0.0 standard drinks    Comment: Occasional alcohol use  . Drug  use: No     Allergies   Keflex [cephalexin] and Flagyl [metronidazole]   Review of Systems Review of Systems  Unable to perform ROS: Mental status change  HENT: Positive for sore throat.   Respiratory: Positive for cough and shortness of breath.   Gastrointestinal: Positive for nausea.  Neurological: Positive for syncope and weakness.     Physical Exam Updated Vital Signs BP 109/73  Pulse 73   Temp 98.2 F (36.8 C) (Oral)   Resp 13   Ht 5\' 11"  (1.803 m)   Wt 77.1 kg   SpO2 100%   BMI 23.71 kg/m   Physical Exam Vitals signs and nursing note reviewed.  Constitutional:      General: She is not in acute distress.    Appearance: She is well-developed.  HENT:     Head: Normocephalic and atraumatic.  Neck:     Musculoskeletal: Neck supple.  Cardiovascular:     Heart sounds: Normal heart sounds. No murmur.     Comments: Tachycardic, but regular. Pulmonary:     Effort: Pulmonary effort is normal. No respiratory distress.     Breath sounds: Normal breath sounds.  Abdominal:     General: There is no distension.     Palpations: Abdomen is soft.     Tenderness: There is no abdominal tenderness.  Skin:    General: Skin is warm and dry.  Neurological:     Mental Status: She is alert and oriented to person, place, and time.     Comments: Alert, oriented. Unable to give me a coherent history of today's events. Frequently losses her train of thought and appears very confused. Will be in the middle of answering a question, then stop and ask what the question was.   Cranial Nerves:  II:  Peripheral visual fields grossly normal, pupils equal, round, reactive to light III, IV, VI: EOM intact bilaterally, ptosis not present V,VII: smile symmetric, eyes kept closed tightly against resistance, facial light touch sensation equal VIII: hearing grossly normal IX, X: symmetric soft palate movement, uvula elevates symmetrically  XI: bilateral shoulder shrug symmetric and strong XII:  midline tongue extension  5/5 muscle strength in upper and lower extremities bilaterally including strong and equal grip strength and dorsiflexion/plantar flexion  Sensory to light touch normal in all four extremities.       ED Treatments / Results  Labs (all labs ordered are listed, but only abnormal results are displayed) Labs Reviewed  COMPREHENSIVE METABOLIC PANEL - Abnormal; Notable for the following components:      Result Value   Glucose, Bld 115 (*)    Total Bilirubin 3.3 (*)    All other components within normal limits  CBC WITH DIFFERENTIAL/PLATELET - Abnormal; Notable for the following components:   Lymphs Abs 0.6 (*)    All other components within normal limits  RAPID URINE DRUG SCREEN, HOSP PERFORMED - Abnormal; Notable for the following components:   Tetrahydrocannabinol POSITIVE (*)    All other components within normal limits  URINALYSIS, ROUTINE W REFLEX MICROSCOPIC - Abnormal; Notable for the following components:   Color, Urine STRAW (*)    Specific Gravity, Urine 1.004 (*)    Ketones, ur 5 (*)    All other components within normal limits  SARS CORONAVIRUS 2 (HOSPITAL ORDER, [facility])  ETHANOL  SALICYLATE LEVEL  ACETAMINOPHEN LEVEL    EKG EKG Interpretation  Date/Time:  Sunday Jul 02 2018 19:21:19 EDT Ventricular Rate:  116 PR Interval:    QRS Duration: 73 QT Interval:  435 QTC Calculation: 605 R Axis:   45 Text Interpretation:  Sinus tachycardia Atrial premature complexes Probable left atrial enlargement Borderline repolarization abnormality Prolonged QT interval When compared to prior, prolonged QTc.  No STEMI Confirmed by Antony Blackbird [phone]) on 07/02/2018 8:45:05 PM   Radiology Ct Head Wo Contrast  Result Date: 07/02/2018 CLINICAL DATA:  Confusion, anxiety EXAM:  CT HEAD WITHOUT CONTRAST TECHNIQUE: Contiguous axial images were obtained from the base of the skull through the vertex without intravenous contrast.  COMPARISON:  10/03/2016 FINDINGS: Brain: No evidence of acute infarction, hemorrhage, hydrocephalus, extra-axial collection or mass lesion/mass effect. Vascular: No hyperdense vessel or unexpected calcification. Skull: Normal. Negative for fracture or focal lesion. Sinuses/Orbits: No acute finding. Other: None. IMPRESSION: No acute intracranial pathology. Electronically Signed   By: Eddie Candle M.D.   On: 07/02/2018 20:05   Dg Chest Portable 1 View  Result Date: 07/02/2018 CLINICAL DATA:  33 year old female with confusion. EXAM: PORTABLE CHEST 1 VIEW COMPARISON:  Chest radiograph dated 11/24/2014 FINDINGS: The heart size and mediastinal contours are within normal limits. Both lungs are clear. The visualized skeletal structures are unremarkable. IMPRESSION: No active disease. Electronically Signed   By: Anner Crete M.D.   On: 07/02/2018 22:36    Procedures Procedures (including critical care time)  Medications Ordered in ED Medications  sodium chloride 0.9 % bolus 1,000 mL (0 mLs Intravenous Stopped 07/02/18 2356)     Initial Impression / Assessment and Plan / ED Course  I have reviewed the triage vital signs and the nursing notes.  Pertinent labs & imaging results that were available during my care of the patient were reviewed by me and considered in my medical decision making (see chart for details).       Carmen Daniels is a 33 y.o. female who presents to ED for evaluation after four syncopal episodes today. Patient is very poor historian and it is difficult to gather today's events. Patient states that she continued to pass out several times. Does report head injury. At some point, she felt as if she was going to have a panic attach (short of breath and nauseous). She took 4 puffs of her inhaler which she states was way to much. She is concerned that could have made her feel poorly. Throughout several evaluations, I am concerned about her mentation. She frequently will stop mid  sentence and asked me to repeat question as she had forgotten.  At 1 point, she may be repeat my question 2 or 3 times, stating that she could hear me, but was not able to put the words together and did not understand the question.  She is following simple commands well.  I do not appreciate any cranial nerve deficits.  I spoke with her sister, Dr. Emeline Darling who also had spoken with her sister over the phone and felt like this was not her baseline mental status as well.  She describes her conversation by saying " she was incoherent and not making any sense".  Patient does have behavioral health history and is on multiple medications.  This could be a contributing factor.   Initially tachycardic, which did improve with fluids. CT head negative. Labs reviewed notable for T bili of 3.3. No complaints of abdominal pain. No abdominal tenderness on exam. Tylenol level of 14.  Patient denies taking any Tylenol or any other substances, specifically no over-the-counter cold medications.  Denies illicit drug use.  Per sister, however allergy symptoms throughout the last week.  Chest x-ray independently reviewed by me without acute findings.  No pneumonia.  COVID test negative.  I spoke with neurology, Dr. Rory Percy, in regards to patient's mental status.  He does recommend obtaining MRI brain without.  Reconsult neurology for abnormal findings.  Believes medication induced confusion given the number of sedating /psychiatric medications could be culprit.   EKG with a  QTC that is very prolonged at 605.  Given her multiple syncopal episodes today with prolonged QTC, feel that patient would benefit from admission.  Consulted hospitalist who will admit.    Patient discussed with Dr. Sherry Ruffing who agrees with treatment plan.    Patient's sister, Dr. Emeline Darling, can be reached at [phone].  She has been updated on patient condition and work up thus far. She would like to be contacted if any deterioration in  condition or in the morning after rounds.   Final Clinical Impressions(s) / ED Diagnoses   Final diagnoses:  Syncope, unspecified syncope type  QT prolongation    ED Discharge Orders    None       Keshan Reha, Ozella Almond, PA-C 07/03/18 0019    Tegeler, Gwenyth Allegra, MD 07/03/18 0020

## 2018-07-02 NOTE — ED Notes (Signed)
Patient transported to MRI 

## 2018-07-02 NOTE — ED Notes (Signed)
Nurse collected labs. 

## 2018-07-02 NOTE — ED Notes (Signed)
Pt will not keep anything on her as far as cuff or cardiac monitor. She takes gown off and puts clothes on.

## 2018-07-03 ENCOUNTER — Observation Stay (HOSPITAL_BASED_OUTPATIENT_CLINIC_OR_DEPARTMENT_OTHER): Payer: Medicaid Other

## 2018-07-03 DIAGNOSIS — R9431 Abnormal electrocardiogram [ECG] [EKG]: Secondary | ICD-10-CM

## 2018-07-03 DIAGNOSIS — R55 Syncope and collapse: Secondary | ICD-10-CM | POA: Diagnosis not present

## 2018-07-03 DIAGNOSIS — I491 Atrial premature depolarization: Secondary | ICD-10-CM | POA: Diagnosis not present

## 2018-07-03 DIAGNOSIS — J019 Acute sinusitis, unspecified: Secondary | ICD-10-CM

## 2018-07-03 DIAGNOSIS — R4182 Altered mental status, unspecified: Secondary | ICD-10-CM

## 2018-07-03 DIAGNOSIS — Z1159 Encounter for screening for other viral diseases: Secondary | ICD-10-CM | POA: Diagnosis not present

## 2018-07-03 DIAGNOSIS — R17 Unspecified jaundice: Secondary | ICD-10-CM

## 2018-07-03 DIAGNOSIS — F319 Bipolar disorder, unspecified: Secondary | ICD-10-CM | POA: Diagnosis not present

## 2018-07-03 HISTORY — DX: Acute sinusitis, unspecified: J01.90

## 2018-07-03 LAB — ECHOCARDIOGRAM COMPLETE
Height: 71 in
Weight: 2797.2 oz

## 2018-07-03 LAB — HEPATIC FUNCTION PANEL
ALT: 24 U/L (ref 0–44)
AST: 15 U/L (ref 15–41)
Albumin: 3.7 g/dL (ref 3.5–5.0)
Alkaline Phosphatase: 50 U/L (ref 38–126)
Bilirubin, Direct: 0.2 mg/dL (ref 0.0–0.2)
Indirect Bilirubin: 3.4 mg/dL — ABNORMAL HIGH (ref 0.3–0.9)
Total Bilirubin: 3.6 mg/dL — ABNORMAL HIGH (ref 0.3–1.2)
Total Protein: 6.5 g/dL (ref 6.5–8.1)

## 2018-07-03 LAB — BASIC METABOLIC PANEL
Anion gap: 8 (ref 5–15)
BUN: 11 mg/dL (ref 6–20)
CO2: 22 mmol/L (ref 22–32)
Calcium: 9 mg/dL (ref 8.9–10.3)
Chloride: 111 mmol/L (ref 98–111)
Creatinine, Ser: 0.78 mg/dL (ref 0.44–1.00)
GFR calc Af Amer: >60 mL/min (ref >60)
GFR calc non Af Amer: >60 mL/min (ref >60)
Glucose, Bld: 115 mg/dL — ABNORMAL HIGH (ref 70–99)
Potassium: 3.5 mmol/L (ref 3.5–5.1)
Sodium: 141 mmol/L (ref 135–145)

## 2018-07-03 LAB — HIV ANTIBODY (ROUTINE TESTING W REFLEX): HIV Screen 4th Generation wRfx: NONREACTIVE

## 2018-07-03 LAB — LITHIUM LEVEL: Lithium Lvl: <0.06 mmol/L — ABNORMAL LOW (ref 0.60–1.20)

## 2018-07-03 LAB — AMMONIA: Ammonia: 30 umol/L (ref 9–35)

## 2018-07-03 LAB — MAGNESIUM: Magnesium: 2 mg/dL (ref 1.7–2.4)

## 2018-07-03 LAB — PROTIME-INR
INR: 1.1 (ref 0.8–1.2)
Prothrombin Time: 13.7 seconds (ref 11.4–15.2)

## 2018-07-03 MED ORDER — ZOLPIDEM TARTRATE 5 MG PO TABS: 5.0000 mg | ORAL_TABLET | Freq: Every evening | ORAL | Status: DC | PRN

## 2018-07-03 MED ORDER — NICOTINE 7 MG/24HR TD PT24
7.0000 mg | MEDICATED_PATCH | Freq: Every day | TRANSDERMAL | Status: DC
  Administered 2018-07-03 – 2018-07-04 (×2): 7 mg via TRANSDERMAL
  Filled 2018-07-03 (×3): qty 1

## 2018-07-03 MED ORDER — LORAZEPAM 0.5 MG PO TABS
0.5000 mg | ORAL_TABLET | Freq: Three times a day (TID) | ORAL | Status: DC | PRN
  Administered 2018-07-03 – 2018-07-04 (×4): 0.5 mg via ORAL
  Filled 2018-07-03 (×4): qty 1

## 2018-07-03 MED ORDER — ENOXAPARIN SODIUM 40 MG/0.4ML ~~LOC~~ SOLN
40.0000 mg | Freq: Every day | SUBCUTANEOUS | Status: DC
  Administered 2018-07-03: 40 mg via SUBCUTANEOUS
  Filled 2018-07-03: qty 0.4

## 2018-07-03 MED ORDER — SUMATRIPTAN SUCCINATE 100 MG PO TABS
100.0000 mg | ORAL_TABLET | Freq: Once | ORAL | Status: AC
  Administered 2018-07-03: 100 mg via ORAL
  Filled 2018-07-03: qty 1

## 2018-07-03 MED ORDER — ALBUTEROL SULFATE (2.5 MG/3ML) 0.083% IN NEBU: 2.5000 mg | INHALATION_SOLUTION | Freq: Four times a day (QID) | RESPIRATORY_TRACT | Status: DC | PRN

## 2018-07-03 MED ORDER — IBUPROFEN 200 MG PO TABS
400.0000 mg | ORAL_TABLET | Freq: Four times a day (QID) | ORAL | Status: DC | PRN
  Administered 2018-07-03 – 2018-07-04 (×3): 400 mg via ORAL
  Filled 2018-07-03 (×3): qty 2

## 2018-07-03 MED ORDER — FLUTICASONE PROPIONATE 50 MCG/ACT NA SUSP
1.0000 | Freq: Every day | NASAL | Status: DC
  Filled 2018-07-03 (×3): qty 16

## 2018-07-03 MED ORDER — DOXYCYCLINE HYCLATE 100 MG PO TABS: 100.0000 mg | ORAL_TABLET | Freq: Two times a day (BID) | ORAL | Status: DC

## 2018-07-03 MED ORDER — SODIUM CHLORIDE 0.9 % IV SOLN
INTRAVENOUS | Status: DC
  Administered 2018-07-03: 13:00:00 via INTRAVENOUS

## 2018-07-03 MED ORDER — ACETAMINOPHEN 325 MG PO TABS
650.0000 mg | ORAL_TABLET | Freq: Four times a day (QID) | ORAL | Status: DC | PRN
  Administered 2018-07-03: 650 mg via ORAL
  Filled 2018-07-03: qty 2

## 2018-07-03 MED ORDER — ACETAMINOPHEN 650 MG RE SUPP: 650.0000 mg | Freq: Four times a day (QID) | RECTAL | Status: DC | PRN

## 2018-07-03 MED ORDER — NYSTATIN 100000 UNIT/ML MT SUSP
5.0000 mL | Freq: Four times a day (QID) | OROMUCOSAL | Status: DC
  Administered 2018-07-03 – 2018-07-04 (×3): 500000 [IU] via ORAL
  Filled 2018-07-03 (×3): qty 5

## 2018-07-03 NOTE — Progress Notes (Signed)
  Echocardiogram 2D Echocardiogram has been performed.  Carmen Daniels 07/03/2018, 9:17 AM

## 2018-07-03 NOTE — Progress Notes (Signed)
Progress Note    Carmen Daniels  FGH:[id number] DOB: 1985/10/04  DOA: 07/02/2018 PCP: [name], MD    Brief Narrative:     Medical records reviewed and are as summarized below:  Carmen Daniels is an 33 y.o. female with medical history significant of anxiety, bipolar disorder, OCD, asthma, allergic rhinitis, chronic back pain, migraines presenting to the hospital complaining of anxiety and altered mental status.  When asked what brought patient to the hospital she replied " I don't know, I think I had a concussion."  States yesterday she passed out 3 times as she was feeling so lightheaded.    Assessment/Plan:   Principal Problem:   Syncope Active Problems:   QT prolongation   AMS (altered mental status)   Serum total bilirubin elevated   Acute rhinosinusitis  High risk syncope, prolonged QTc and orthostatic positive Patient had 4 episodes of syncope today.  EKG showing sinus rhythm and QTC 605.  Suspect QTC prolongation is due to lithium use.  Patient states her psychiatrist had increased the dose of lithium a few weeks ago.  No infectious signs or symptoms. -IVF and recheck orthostatic vital signs -Echocardiogram: normal --Hold QT prolonging drugs-- on multiple   Altered mental status Patient is not somnolent or disoriented.  Appears forgetful on exam.  -spoke with sister-- not back to her baseline  Elevated indirect bili -check ammonia level -? Etiology -last check was normal in 2019   Acute rhinosinusitis Patient is complaining of pain/pressure in her sinuses and purulent nasal discharge.  Sinuses mildly tender to palpation on exam.  Afebrile and no leukocytosis.  CT head and brain MRI showing no acute finding of sinuses.  -Symptomatic management: Flonase nasal spray, nasal saline irrigation, Tylenol PRN  Asthma Stable.  No bronchospasm. -Hold albuterol at this time given QT prolongation  Bipolar disorder Patient is currently on lithium and the dose was  increased by her psychiatrist a few weeks ago.  Now has significant QT prolongation. -Hold lithium -check level  Home medications need to be reviewed by pharmacy  Family Communication/Anticipated D/C date and plan/Code Status   DVT prophylaxis: Lovenox ordered. Code Status: Full Code.  Family Communication: spoke with sister on phone (MD in [place]) Disposition Plan: home in AM vs PM pending orthostatics and lab results   Medical Consultants:    None.    Subjective:   " I feel better"  Objective:    Vitals:   07/03/18 0100 07/03/18 0130 07/03/18 0133 07/03/18 0545  BP: 100/79   108/66  Pulse: (!) 110 67  65  Resp: 18  18   Temp:   97.7 F (36.5 C) 99.3 F (37.4 C)  TempSrc:   Oral Oral  SpO2: 95% 100% 95% 100%  Weight:   79.3 kg   Height:   5\' 11"  (1.803 m)     Intake/Output Summary (Last 24 hours) at 07/03/2018 1305 Last data filed at 07/02/2018 2356 Gross per 24 hour  Intake 2081.25 ml  Output --  Net 2081.25 ml   Filed Weights   07/02/18 1913 07/03/18 0133  Weight: 77.1 kg 79.3 kg    Exam: In bed, NAD Pleasant but guarded rrr No increased work of breathing tattoos on neck with body modification  Data Reviewed:   I have personally reviewed following labs and imaging studies:  Labs: Labs show the following:   Basic Metabolic Panel: Recent Labs  Lab 07/02/18 2110 07/03/18 0338  NA 139 141  K  4.1 3.5  CL 105 111  CO2 24 22  GLUCOSE 115* 115*  BUN 12 11  CREATININE 0.89 0.78  CALCIUM 9.7 9.0  MG  --  2.0   GFR Estimated Creatinine Clearance: 110.7 mL/min (by C-G formula based on SCr of 0.78 mg/dL). Liver Function Tests: Recent Labs  Lab 07/02/18 2110 07/03/18 0338  AST 19 15  ALT 29 24  ALKPHOS 53 50  BILITOT 3.3* 3.6*  PROT 7.4 6.5  ALBUMIN 4.5 3.7   No results for input(s): LIPASE, AMYLASE in the last 168 hours. No results for input(s): AMMONIA in the last 168 hours. Coagulation profile No results for input(s): INR,  PROTIME in the last 168 hours.  CBC: Recent Labs  Lab 07/02/18 2110  WBC 7.6  NEUTROABS 6.6  HGB 13.3  HCT 39.1  MCV 88.1  PLT 235   Cardiac Enzymes: No results for input(s): CKTOTAL, CKMB, CKMBINDEX, TROPONINI in the last 168 hours. BNP (last 3 results) No results for input(s): PROBNP in the last 8760 hours. CBG: No results for input(s): GLUCAP in the last 168 hours. D-Dimer: No results for input(s): DDIMER in the last 72 hours. Hgb A1c: No results for input(s): HGBA1C in the last 72 hours. Lipid Profile: No results for input(s): CHOL, HDL, LDLCALC, TRIG, CHOLHDL, LDLDIRECT in the last 72 hours. Thyroid function studies: No results for input(s): TSH, T4TOTAL, T3FREE, THYROIDAB in the last 72 hours.  Invalid input(s): FREET3 Anemia work up: No results for input(s): VITAMINB12, FOLATE, FERRITIN, TIBC, IRON, RETICCTPCT in the last 72 hours. Sepsis Labs: Recent Labs  Lab 07/02/18 2110  WBC 7.6    Microbiology Recent Results (from the past 240 hour(s))  SARS Coronavirus 2 (CEPHEID- Performed in [place] hospital lab), Hosp Order     Status: None   Collection Time: 07/02/18  9:03 PM  Result Value Ref Range Status   SARS Coronavirus 2 NEGATIVE NEGATIVE Final    Comment: (NOTE) If result is NEGATIVE SARS-CoV-2 target nucleic acids are NOT DETECTED. The SARS-CoV-2 RNA is generally detectable in upper and lower  respiratory specimens during the acute phase of infection. The lowest  concentration of SARS-CoV-2 viral copies this assay can detect is 250  copies / mL. A negative result does not preclude SARS-CoV-2 infection  and should not be used as the sole basis for treatment or other  patient management decisions.  A negative result may occur with  improper specimen collection / handling, submission of specimen other  than nasopharyngeal swab, presence of viral mutation(s) within the  areas targeted by this assay, and inadequate number of viral copies  (<250 copies /  mL). A negative result must be combined with clinical  observations, patient history, and epidemiological information. If result is POSITIVE SARS-CoV-2 target nucleic acids are DETECTED. The SARS-CoV-2 RNA is generally detectable in upper and lower  respiratory specimens dur ing the acute phase of infection.  Positive  results are indicative of active infection with SARS-CoV-2.  Clinical  correlation with patient history and other diagnostic information is  necessary to determine patient infection status.  Positive results do  not rule out bacterial infection or co-infection with other viruses. If result is PRESUMPTIVE POSTIVE SARS-CoV-2 nucleic acids MAY BE PRESENT.   A presumptive positive result was obtained on the submitted specimen  and confirmed on repeat testing.  While 2019 novel coronavirus  (SARS-CoV-2) nucleic acids may be present in the submitted sample  additional confirmatory testing may be necessary for epidemiological  and /  or clinical management purposes  to differentiate between  SARS-CoV-2 and other Sarbecovirus currently known to infect humans.  If clinically indicated additional testing with an alternate test  methodology [id number]) is advised. The SARS-CoV-2 RNA is generally  detectable in upper and lower respiratory sp ecimens during the acute  phase of infection. The expected result is Negative. Fact Sheet for Patients:  StrictlyIdeas.no Fact Sheet for Healthcare Providers: BankingDealers.co.za This test is not yet approved or cleared by the [place] FDA and has been authorized for detection and/or diagnosis of SARS-CoV-2 by FDA under an Emergency Use Authorization (EUA).  This EUA will remain in effect (meaning this test can be used) for the duration of the COVID-19 declaration under Section 564(b)(1) of the Act, 21 U.S.C. section 360bbb-3(b)(1), unless the authorization is terminated or revoked  sooner. Performed at [facility], [place] [address] [place], [place] [address]     Procedures and diagnostic studies:  Ct Head Wo Contrast  Result Date: 07/02/2018 CLINICAL DATA:  Confusion, anxiety EXAM: CT HEAD WITHOUT CONTRAST TECHNIQUE: Contiguous axial images were obtained from the base of the skull through the vertex without intravenous contrast. COMPARISON:  10/03/2016 FINDINGS: Brain: No evidence of acute infarction, hemorrhage, hydrocephalus, extra-axial collection or mass lesion/mass effect. Vascular: No hyperdense vessel or unexpected calcification. Skull: Normal. Negative for fracture or focal lesion. Sinuses/Orbits: No acute finding. Other: None. IMPRESSION: No acute intracranial pathology. Electronically Signed   By: Eddie Candle M.D.   On: 07/02/2018 20:05   Mr Brain Wo Contrast  Result Date: 07/03/2018 CLINICAL DATA:  Anxiety and confusion EXAM: MRI HEAD WITHOUT CONTRAST TECHNIQUE: Multiplanar, multiecho pulse sequences of the brain and surrounding structures were obtained without intravenous contrast. COMPARISON:  06/02/2017 brain MRI FINDINGS: BRAIN: There is no acute infarct, acute hemorrhage or extra-axial collection. The midline structures are normal. No midline shift or other mass effect. The white matter signal is normal for the patient's age. The cerebral and cerebellar volume are age-appropriate. No hydrocephalus. Susceptibility-sensitive sequences show no chronic microhemorrhage or superficial siderosis. No mass lesion. VASCULAR: The major intracranial arterial and venous sinus flow voids are normal. SKULL AND UPPER CERVICAL SPINE: Calvarial bone marrow signal is normal. There is no skull base mass. Visualized upper cervical spine and soft tissues are normal. SINUSES/ORBITS: No fluid levels or advanced mucosal thickening. No mastoid or middle ear effusion. The orbits are normal. IMPRESSION: Normal brain MRI. Electronically Signed   By: Ulyses Jarred M.D.   On: 07/03/2018  00:36   Dg Chest Portable 1 View  Result Date: 07/02/2018 CLINICAL DATA:  33 year old female with confusion. EXAM: PORTABLE CHEST 1 VIEW COMPARISON:  Chest radiograph dated 11/24/2014 FINDINGS: The heart size and mediastinal contours are within normal limits. Both lungs are clear. The visualized skeletal structures are unremarkable. IMPRESSION: No active disease. Electronically Signed   By: Anner Crete M.D.   On: 07/02/2018 22:36    Medications:    enoxaparin (LOVENOX) injection  40 mg Subcutaneous Daily   fluticasone  1 spray Each Nare Daily   Continuous Infusions:  sodium chloride       LOS: 0 days   Geradine Girt  [facility]   How to contact the [facility] Attending or Consulting provider [place] or covering provider during after hours [place], for this patient?  1. Check the care team in [facility] and look for a) attending/consulting [facility] provider listed and b) the [facility] team listed 2. Log into [email or web address] and use [place]'s  universal password to access. If you do not have the password, please contact the hospital operator. 3. Locate the [facility] provider you are looking for under [facility] and page to a number that you can be directly reached. 4. If you still have difficulty reaching the provider, please page the [facility] (Director on Call) for the Hospitalists listed on amion for assistance.  07/03/2018, 1:05 PM

## 2018-07-03 NOTE — Progress Notes (Signed)
Patient is very challenging to care for.  Very anxious, at times doesn't seem rational.  Mood swings. Maturity that does not reflect chronological age.  Intervened throughout day in response to multiple patient needs.  Threatened to leave AMA at one point, removing electrodes and pulling out IV line from arm, because she "wasn't getting any information" and "you aren't doing anything for me or explaining what the plan is."  This despite ongoing dialog from myself, Dr. Eliseo Squires, and patient's sister, an MD, who had spoken to Dr. Eliseo Squires.

## 2018-07-03 NOTE — Progress Notes (Addendum)
Pt is requesting none of her family members (Mom, Dad, & 2 sisters) will be given any information about her treatment while she is in the hospital.

## 2018-07-03 NOTE — ED Notes (Signed)
ED TO INPATIENT HANDOFF REPORT  ED Nurse Name and Phone #:  Callie Fielding [phone]  S Name/Age/Gender Carmen Daniels 33 y.o. female Room/Bed: 026C/026C  Code Status   Code Status: Prior  Home/SNF/Other Home Patient oriented to: self, place, time and situation Is this baseline? Yes   Triage Complete: Triage complete  Chief Complaint Anxiety  Triage Note [facility]- pt here for anxiety attack. Pt states she has been stressed out due to ex-husband moving back in with her. Pt takes meds for anxiety. EMS reports possibly taking too much meds.  VSS stable   Allergies Allergies  Allergen Reactions  . Keflex [Cephalexin] Anaphylaxis  . Flagyl [Metronidazole] Diarrhea    Level of Care/Admitting Diagnosis ED Disposition    ED Disposition Condition [facility]: [place] [[id number]]  Level of Care: Telemetry Cardiac [103]  I expect the patient will be discharged within 24 hours: Yes  LOW acuity---Tx typically complete <24 hrs---ACUTE conditions typically can be evaluated <24 hours---LABS likely to return to acceptable levels <24 hours---IS near functional baseline---EXPECTED to return to current living arrangement---NOT newly hypoxic: Meets criteria for 5C-Observation unit  Covid Evaluation: N/A  Diagnosis: Syncope [206001]  Admitting Physician: Shela Leff [[id number]]  Attending Physician: Shela Leff [[id number]]  PT Class (Do Not Modify): Observation [104]  PT Acc Code (Do Not Modify): Observation [10022]       B Medical/Surgery History Past Medical History:  Diagnosis Date  . Allergic rhinitis   . Anxiety   . Asthma   . Back pain   . Bipolar disorder ([place])   . Chronic pain   . Migraines   . Nasal polyps   . Nausea   . Neck pain   . OCD (obsessive compulsive disorder)    Past Surgical History:  Procedure Laterality Date  . NASAL SINUS SURGERY    . WISDOM TOOTH EXTRACTION       A IV Location/Drains/Wounds Patient  Lines/Drains/Airways Status   Active Line/Drains/Airways    Name:   Placement date:   Placement time:   Site:   Days:   Peripheral IV 07/02/18 Left Hand   07/02/18    2150    Hand   1          Intake/Output Last 24 hours  Intake/Output Summary (Last 24 hours) at 07/03/2018 0043 Last data filed at 07/02/2018 2356 Gross per 24 hour  Intake 2081.25 ml  Output -  Net 2081.25 ml    Labs/Imaging Results for orders placed or performed during the hospital encounter of 07/02/18 (from the past 48 hour(s))  SARS Coronavirus 2 (CEPHEID- Performed in [facility] hospital lab), Hosp Order     Status: None   Collection Time: 07/02/18  9:03 PM  Result Value Ref Range   SARS Coronavirus 2 NEGATIVE NEGATIVE    Comment: (NOTE) If result is NEGATIVE SARS-CoV-2 target nucleic acids are NOT DETECTED. The SARS-CoV-2 RNA is generally detectable in upper and lower  respiratory specimens during the acute phase of infection. The lowest  concentration of SARS-CoV-2 viral copies this assay can detect is 250  copies / mL. A negative result does not preclude SARS-CoV-2 infection  and should not be used as the sole basis for treatment or other  patient management decisions.  A negative result may occur with  improper specimen collection / handling, submission of specimen other  than nasopharyngeal swab, presence of viral mutation(s) within the  areas targeted by this assay, and inadequate  number of viral copies  (<250 copies / mL). A negative result must be combined with clinical  observations, patient history, and epidemiological information. If result is POSITIVE SARS-CoV-2 target nucleic acids are DETECTED. The SARS-CoV-2 RNA is generally detectable in upper and lower  respiratory specimens dur ing the acute phase of infection.  Positive  results are indicative of active infection with SARS-CoV-2.  Clinical  correlation with patient history and other diagnostic information is  necessary to determine  patient infection status.  Positive results do  not rule out bacterial infection or co-infection with other viruses. If result is PRESUMPTIVE POSTIVE SARS-CoV-2 nucleic acids MAY BE PRESENT.   A presumptive positive result was obtained on the submitted specimen  and confirmed on repeat testing.  While 2019 novel coronavirus  (SARS-CoV-2) nucleic acids may be present in the submitted sample  additional confirmatory testing may be necessary for epidemiological  and / or clinical management purposes  to differentiate between  SARS-CoV-2 and other Sarbecovirus currently known to infect humans.  If clinically indicated additional testing with an alternate test  methodology [phone]) is advised. The SARS-CoV-2 RNA is generally  detectable in upper and lower respiratory sp ecimens during the acute  phase of infection. The expected result is Negative. Fact Sheet for Patients:  StrictlyIdeas.no Fact Sheet for Healthcare Providers: BankingDealers.co.za This test is not yet approved or cleared by the [place] FDA and has been authorized for detection and/or diagnosis of SARS-CoV-2 by FDA under an Emergency Use Authorization (EUA).  This EUA will remain in effect (meaning this test can be used) for the duration of the COVID-19 declaration under Section 564(b)(1) of the Act, 21 U.S.C. section 360bbb-3(b)(1), unless the authorization is terminated or revoked sooner. Performed at [facility], [place] [address], [place], [place] [address]   Comprehensive metabolic panel     Status: Abnormal   Collection Time: 07/02/18  9:10 PM  Result Value Ref Range   Sodium 139 135 - 145 mmol/L   Potassium 4.1 3.5 - 5.1 mmol/L   Chloride 105 98 - 111 mmol/L   CO2 24 22 - 32 mmol/L   Glucose, Bld 115 (H) 70 - 99 mg/dL   BUN 12 6 - 20 mg/dL   Creatinine, Ser 0.89 0.44 - 1.00 mg/dL   Calcium 9.7 8.9 - 10.3 mg/dL   Total Protein 7.4 6.5 - 8.1 g/dL    Albumin 4.5 3.5 - 5.0 g/dL   AST 19 15 - 41 U/L   ALT 29 0 - 44 U/L   Alkaline Phosphatase 53 38 - 126 U/L   Total Bilirubin 3.3 (H) 0.3 - 1.2 mg/dL   GFR calc non Af Amer >60 >60 mL/min   GFR calc Af Amer >60 >60 mL/min   Anion gap 10 5 - 15    Comment: Performed at [facility] [address], [place], [place] [address]  CBC with Differential     Status: Abnormal   Collection Time: 07/02/18  9:10 PM  Result Value Ref Range   WBC 7.6 4.0 - 10.5 K/uL   RBC 4.44 3.87 - 5.11 MIL/uL   Hemoglobin 13.3 12.0 - 15.0 g/dL   HCT 39.1 36.0 - 46.0 %   MCV 88.1 80.0 - 100.0 fL   MCH 30.0 26.0 - 34.0 pg   MCHC 34.0 30.0 - 36.0 g/dL   RDW 11.8 11.5 - 15.5 %   Platelets 235 150 - 400 K/uL   nRBC 0.0 0.0 - 0.2 %  Neutrophils Relative % 88 %   Neutro Abs 6.6 1.7 - 7.7 K/uL   Lymphocytes Relative 8 %   Lymphs Abs 0.6 (L) 0.7 - 4.0 K/uL   Monocytes Relative 4 %   Monocytes Absolute 0.3 0.1 - 1.0 K/uL   Eosinophils Relative 0 %   Eosinophils Absolute 0.0 0.0 - 0.5 K/uL   Basophils Relative 0 %   Basophils Absolute 0.0 0.0 - 0.1 K/uL   Immature Granulocytes 0 %   Abs Immature Granulocytes 0.03 0.00 - 0.07 K/uL    Comment: Performed at [facility] [address] [place], [place] [address]  Ethanol     Status: None   Collection Time: 07/02/18  9:12 PM  Result Value Ref Range   Alcohol, Ethyl (B) <10 <10 mg/dL    Comment: (NOTE) Lowest detectable limit for serum alcohol is 10 mg/dL. For medical purposes only. Performed at [facility], [place] [address], [place], [place] [address]   Salicylate level     Status: None   Collection Time: 07/02/18  9:12 PM  Result Value Ref Range   Salicylate Lvl <1.7 2.8 - 30.0 mg/dL    Comment: Performed at [facility] [address], [place], [place] [address]  Acetaminophen level     Status: None   Collection Time: 07/02/18  9:12 PM  Result Value Ref Range   Acetaminophen (Tylenol), Serum 14 10 - 30 ug/mL    Comment:  (NOTE) Therapeutic concentrations vary significantly. A range of 10-30 ug/mL  may be an effective concentration for many patients. However, some  are best treated at concentrations outside of this range. Acetaminophen concentrations >150 ug/mL at 4 hours after ingestion  and >50 ug/mL at 12 hours after ingestion are often associated with  toxic reactions. Performed at [facility], [place] [address] [place], [place] [address]   Urine rapid drug screen (hosp performed)     Status: Abnormal   Collection Time: 07/02/18 11:02 PM  Result Value Ref Range   Opiates NONE DETECTED NONE DETECTED   Cocaine NONE DETECTED NONE DETECTED   Benzodiazepines NONE DETECTED NONE DETECTED   Amphetamines NONE DETECTED NONE DETECTED   Tetrahydrocannabinol POSITIVE (A) NONE DETECTED   Barbiturates NONE DETECTED NONE DETECTED    Comment: (NOTE) DRUG SCREEN FOR MEDICAL PURPOSES ONLY.  IF CONFIRMATION IS NEEDED FOR ANY PURPOSE, NOTIFY LAB WITHIN 5 DAYS. LOWEST DETECTABLE LIMITS FOR URINE DRUG SCREEN Drug Class                     Cutoff (ng/mL) Amphetamine and metabolites    1000 Barbiturate and metabolites    200 Benzodiazepine                 824 Tricyclics and metabolites     300 Opiates and metabolites        300 Cocaine and metabolites        300 THC                            50 Performed at [facility], [place] [address], [place],  [address]   Urinalysis, Routine w reflex microscopic     Status: Abnormal   Collection Time: 07/02/18 11:02 PM  Result Value Ref Range   Color, Urine STRAW (A) YELLOW   APPearance CLEAR CLEAR   Specific Gravity, Urine 1.004 (L) 1.005 - 1.030   pH  7.0 5.0 - 8.0   Glucose, UA NEGATIVE NEGATIVE mg/dL   Hgb urine dipstick NEGATIVE NEGATIVE   Bilirubin Urine NEGATIVE NEGATIVE   Ketones, ur 5 (A) NEGATIVE mg/dL   Protein, ur NEGATIVE NEGATIVE mg/dL   Nitrite NEGATIVE NEGATIVE   Leukocytes,Ua NEGATIVE NEGATIVE    Comment: Performed at [facility] [address], [place],  [address]   Ct Head Wo Contrast  Result Date: 07/02/2018 CLINICAL DATA:  Confusion, anxiety EXAM: CT HEAD WITHOUT CONTRAST TECHNIQUE: Contiguous axial images were obtained from the base of the skull through the vertex without intravenous contrast. COMPARISON:  10/03/2016 FINDINGS: Brain: No evidence of acute infarction, hemorrhage, hydrocephalus, extra-axial collection or mass lesion/mass effect. Vascular: No hyperdense vessel or unexpected calcification. Skull: Normal. Negative for fracture or focal lesion. Sinuses/Orbits: No acute finding. Other: None. IMPRESSION: No acute intracranial pathology. Electronically Signed   By: Eddie Candle M.D.   On: 07/02/2018 20:05   Mr Brain Wo Contrast  Result Date: 07/03/2018 CLINICAL DATA:  Anxiety and confusion EXAM: MRI HEAD WITHOUT CONTRAST TECHNIQUE: Multiplanar, multiecho pulse sequences of the brain and surrounding structures were obtained without intravenous contrast. COMPARISON:  06/02/2017 brain MRI FINDINGS: BRAIN: There is no acute infarct, acute hemorrhage or extra-axial collection. The midline structures are normal. No midline shift or other mass effect. The white matter signal is normal for the patient's age. The cerebral and cerebellar volume are age-appropriate. No hydrocephalus. Susceptibility-sensitive sequences show no chronic microhemorrhage or superficial siderosis. No mass lesion. VASCULAR: The major intracranial arterial and venous sinus flow voids are normal. SKULL AND UPPER CERVICAL SPINE: Calvarial bone marrow signal is normal. There is no skull base mass. Visualized upper cervical spine and soft tissues are normal. SINUSES/ORBITS: No fluid levels or advanced mucosal thickening. No mastoid or middle ear effusion. The orbits are normal. IMPRESSION: Normal brain MRI. Electronically Signed   By: Ulyses Jarred M.D.   On: 07/03/2018 00:36   Dg Chest Portable 1 View  Result Date: 07/02/2018 CLINICAL DATA:   33 year old female with confusion. EXAM: PORTABLE CHEST 1 VIEW COMPARISON:  Chest radiograph dated 11/24/2014 FINDINGS: The heart size and mediastinal contours are within normal limits. Both lungs are clear. The visualized skeletal structures are unremarkable. IMPRESSION: No active disease. Electronically Signed   By: Anner Crete M.D.   On: 07/02/2018 22:36    Pending Labs Unresulted Labs (From admission, onward)   None      Vitals/Pain Today's Vitals   07/02/18 1918 07/02/18 2152 07/02/18 2200 07/02/18 2304  BP: (!) 116/101 112/68 118/71 109/73  Pulse: (!) 131 83 85 73  Resp: 18 17 16 13   Temp: 98.2 F (36.8 C)     TempSrc: Oral     SpO2: 100% 100% 100% 100%  Weight:      Height:      PainSc:        Isolation Precautions Droplet and Contact precautions  Medications Medications  sodium chloride 0.9 % bolus 1,000 mL (0 mLs Intravenous Stopped 07/02/18 2356)    Mobility walks Low fall risk   Focused Assessments Skin   R Recommendations: See Admitting Provider Note  Report given to:   Additional Notes:  Will not keep anything on her as far as cuff or cardiac leads.

## 2018-07-04 DIAGNOSIS — R9431 Abnormal electrocardiogram [ECG] [EKG]: Secondary | ICD-10-CM | POA: Diagnosis not present

## 2018-07-04 DIAGNOSIS — F319 Bipolar disorder, unspecified: Secondary | ICD-10-CM | POA: Diagnosis not present

## 2018-07-04 DIAGNOSIS — Z1159 Encounter for screening for other viral diseases: Secondary | ICD-10-CM | POA: Diagnosis not present

## 2018-07-04 DIAGNOSIS — R17 Unspecified jaundice: Secondary | ICD-10-CM | POA: Diagnosis not present

## 2018-07-04 DIAGNOSIS — R55 Syncope and collapse: Secondary | ICD-10-CM | POA: Diagnosis not present

## 2018-07-04 DIAGNOSIS — I491 Atrial premature depolarization: Secondary | ICD-10-CM | POA: Diagnosis not present

## 2018-07-04 LAB — COMPREHENSIVE METABOLIC PANEL
ALT: 19 U/L (ref 0–44)
AST: 15 U/L (ref 15–41)
Albumin: 3.6 g/dL (ref 3.5–5.0)
Alkaline Phosphatase: 47 U/L (ref 38–126)
Anion gap: 7 (ref 5–15)
BUN: 9 mg/dL (ref 6–20)
CO2: 21 mmol/L — ABNORMAL LOW (ref 22–32)
Calcium: 8.9 mg/dL (ref 8.9–10.3)
Chloride: 113 mmol/L — ABNORMAL HIGH (ref 98–111)
Creatinine, Ser: 0.83 mg/dL (ref 0.44–1.00)
GFR calc Af Amer: >60 mL/min (ref >60)
GFR calc non Af Amer: >60 mL/min (ref >60)
Glucose, Bld: 97 mg/dL (ref 70–99)
Potassium: 3.5 mmol/L (ref 3.5–5.1)
Sodium: 141 mmol/L (ref 135–145)
Total Bilirubin: 1.6 mg/dL — ABNORMAL HIGH (ref 0.3–1.2)
Total Protein: 6.1 g/dL — ABNORMAL LOW (ref 6.5–8.1)

## 2018-07-04 MED ORDER — LITHIUM CARBONATE ER 450 MG PO TBCR
900.0000 mg | EXTENDED_RELEASE_TABLET | Freq: Every day | ORAL | Status: DC
  Filled 2018-07-04: qty 2

## 2018-07-04 MED ORDER — ARIPIPRAZOLE 5 MG PO TABS: 20.0000 mg | ORAL_TABLET | Freq: Every day | ORAL | Status: DC

## 2018-07-04 MED ORDER — NYSTATIN 100000 UNIT/ML MT SUSP: 5.0000 mL | Freq: Four times a day (QID) | OROMUCOSAL | 0 refills | Status: DC

## 2018-07-04 MED ORDER — HYDROXYZINE HCL 50 MG PO TABS: 50.0000 mg | ORAL_TABLET | Freq: Two times a day (BID) | ORAL | 0 refills | Status: DC | PRN

## 2018-07-04 NOTE — Discharge Instructions (Signed)

## 2018-07-04 NOTE — Progress Notes (Signed)
Discharge order received.  Discharge instructions reviewed with and given to patient.  Patient's home medications returned to her from main pharmacy.

## 2018-07-04 NOTE — Discharge Summary (Signed)
Physician Discharge Summary  Carmen Daniels QMG:[id number] DOB: 1985/09/18 DOA: 07/02/2018  PCP: [name], MD  Admit date: 07/02/2018 Discharge date: 07/04/2018  Admitted From: home Discharge disposition: home   Recommendations for Outpatient Follow-Up:   1. EKG 1 week regarding Qtc 2. Patient with polypharmacy-- ativan and klonopin, d/c'd flexeril and propanolol d/c'd 3. Advised CBD oil d/c 4. Limit Qtc prolonging medications   Discharge Diagnosis:   Principal Problem:   Syncope Active Problems:   QT prolongation   AMS (altered mental status)   Serum total bilirubin elevated   Acute rhinosinusitis    Discharge Condition: Improved.  Diet recommendation:  Regular.  Wound care: None.  Code status: Full.   History of Present Illness:   Carmen Daniels is a 33 y.o. female with medical history significant of anxiety, bipolar disorder, OCD, asthma, allergic rhinitis, chronic back pain, migraines presenting to the hospital complaining of anxiety and altered mental status.  When asked what brought patient to the hospital she replied " I don't know, I think I had a concussion."  States yesterday she passed out 3 times as she was feeling so lightheaded.  She is not sure if she was having any chest pain.  She thinks she may have been short of breath as her "breathing was a little funny."  She thinks she was having heart palpitations.  Denies family history of sudden cardiac death.  States she takes CBD oil for insomnia and chronic pain but does not smoke marijuana.  States yesterday she had a panic attack and took a much higher dose of CBD oil.  She also used her inhalers at that time.  States she is having pain and pressure in her sinuses and has a history of sinusitis and previous sinus surgery.  She is having green-colored nasal discharge.  States her psychiatrist Dr. Toy Care increased the dose of lithium a few weeks ago and since then she has been feeling lightheaded.  States she  stopped taking Abilify and trazodone a year ago.  She takes Flexeril for chronic back and neck pain.  Patient seemed forgetful and a few times stated " I'm sorry what did you say a minute ago?" and "can you please repeat what you just said."  No additional history could be obtained at this time.   Hospital Course by Problem:   High risk syncope, prolonged QTc and orthostatic positive Patient had 4 episodes of syncope prior to admission.  -EKG showing sinus rhythm and QTC 605. Suspect QTC prolongation is due to medications-- no clearly lithium as was not taking consistently.  -No infectious signs or symptoms. -s/p IVF and improvement in symptoms -Echocardiogram: normal --limit QT prolonging drugs-- resolved here-- EKG as an outpatient in 1 week  Altered mental status -resolved  Elevated indirect bili -? Etiology -trending down  Acuterhinosinusitis Patient is complaining of pain/pressure in her sinuses and purulent nasal discharge. Sinuses mildly tender to palpation on exam. Afebrile and no leukocytosis. CT head and brain MRI showing no acute finding of sinuses. -Symptomatic management: Flonase nasal spray, nasal saline irrigation, Tylenol PRN  Asthma Stable. No bronchospasm. -encouraged smoking cessation  Tobacco abuse -encourage smoking cessation  Bipolar disorder Patient is currently on lithium and the dose was increased by her psychiatrist a few weeks ago.Now has significantQT prolongation. -lithium level low-- patient says she has not been taking  Thrush -nystatin swish and swallow   Medical Consultants:      Discharge Exam:   Vitals:  07/03/18 2130 07/04/18 0500  BP: 114/67 (!) 128/49  Pulse: 62 64  Resp: 18 18  Temp: 98.7 F (37.1 C) 98.2 F (36.8 C)  SpO2: 100% 100%   Vitals:   07/03/18 1528 07/03/18 2000 07/03/18 2130 07/04/18 0500  BP: 118/65  114/67 (!) 128/49  Pulse: 72 63 62 64  Resp: 18  18 18   Temp: 98.7 F (37.1 C)  98.7  F (37.1 C) 98.2 F (36.8 C)  TempSrc: Oral  Oral Oral  SpO2: 100% 99% 100% 100%  Weight:    76.8 kg  Height:        General exam: Appears calm and comfortable. Feels much better and would like to go home  The results of significant diagnostics from this hospitalization (including imaging, microbiology, ancillary and laboratory) are listed below for reference.     Procedures and Diagnostic Studies:   Ct Head Wo Contrast  Result Date: 07/02/2018 CLINICAL DATA:  Confusion, anxiety EXAM: CT HEAD WITHOUT CONTRAST TECHNIQUE: Contiguous axial images were obtained from the base of the skull through the vertex without intravenous contrast. COMPARISON:  10/03/2016 FINDINGS: Brain: No evidence of acute infarction, hemorrhage, hydrocephalus, extra-axial collection or mass lesion/mass effect. Vascular: No hyperdense vessel or unexpected calcification. Skull: Normal. Negative for fracture or focal lesion. Sinuses/Orbits: No acute finding. Other: None. IMPRESSION: No acute intracranial pathology. Electronically Signed   By: Eddie Candle M.D.   On: 07/02/2018 20:05   Mr Brain Wo Contrast  Result Date: 07/03/2018 CLINICAL DATA:  Anxiety and confusion EXAM: MRI HEAD WITHOUT CONTRAST TECHNIQUE: Multiplanar, multiecho pulse sequences of the brain and surrounding structures were obtained without intravenous contrast. COMPARISON:  06/02/2017 brain MRI FINDINGS: BRAIN: There is no acute infarct, acute hemorrhage or extra-axial collection. The midline structures are normal. No midline shift or other mass effect. The white matter signal is normal for the patient's age. The cerebral and cerebellar volume are age-appropriate. No hydrocephalus. Susceptibility-sensitive sequences show no chronic microhemorrhage or superficial siderosis. No mass lesion. VASCULAR: The major intracranial arterial and venous sinus flow voids are normal. SKULL AND UPPER CERVICAL SPINE: Calvarial bone marrow signal is normal. There is no skull  base mass. Visualized upper cervical spine and soft tissues are normal. SINUSES/ORBITS: No fluid levels or advanced mucosal thickening. No mastoid or middle ear effusion. The orbits are normal. IMPRESSION: Normal brain MRI. Electronically Signed   By: Ulyses Jarred M.D.   On: 07/03/2018 00:36   Dg Chest Portable 1 View  Result Date: 07/02/2018 CLINICAL DATA:  33 year old female with confusion. EXAM: PORTABLE CHEST 1 VIEW COMPARISON:  Chest radiograph dated 11/24/2014 FINDINGS: The heart size and mediastinal contours are within normal limits. Both lungs are clear. The visualized skeletal structures are unremarkable. IMPRESSION: No active disease. Electronically Signed   By: Anner Crete M.D.   On: 07/02/2018 22:36     Labs:   Basic Metabolic Panel: Recent Labs  Lab 07/02/18 2110 07/03/18 0338 07/04/18 0347  NA 139 141 141  K 4.1 3.5 3.5  CL 105 111 113*  CO2 24 22 21*  GLUCOSE 115* 115* 97  Daniels 12 11 9   CREATININE 0.89 0.78 0.83  CALCIUM 9.7 9.0 8.9  MG  --  2.0  --    GFR Estimated Creatinine Clearance: 106.7 mL/min (by C-G formula based on SCr of 0.83 mg/dL). Liver Function Tests: Recent Labs  Lab 07/02/18 2110 07/03/18 0338 07/04/18 0347  AST 19 15 15   ALT 29 24 19   ALKPHOS 53 50 47  BILITOT 3.3* 3.6* 1.6*  PROT 7.4 6.5 6.1*  ALBUMIN 4.5 3.7 3.6   No results for input(s): LIPASE, AMYLASE in the last 168 hours. Recent Labs  Lab 07/03/18 1300  AMMONIA 30   Coagulation profile Recent Labs  Lab 07/03/18 1300  INR 1.1    CBC: Recent Labs  Lab 07/02/18 2110  WBC 7.6  NEUTROABS 6.6  HGB 13.3  HCT 39.1  MCV 88.1  PLT 235   Cardiac Enzymes: No results for input(s): CKTOTAL, CKMB, CKMBINDEX, TROPONINI in the last 168 hours. BNP: Invalid input(s): POCBNP CBG: No results for input(s): GLUCAP in the last 168 hours. D-Dimer No results for input(s): DDIMER in the last 72 hours. Hgb A1c No results for input(s): HGBA1C in the last 72 hours. Lipid  Profile No results for input(s): CHOL, HDL, LDLCALC, TRIG, CHOLHDL, LDLDIRECT in the last 72 hours. Thyroid function studies No results for input(s): TSH, T4TOTAL, T3FREE, THYROIDAB in the last 72 hours.  Invalid input(s): FREET3 Anemia work up No results for input(s): VITAMINB12, FOLATE, FERRITIN, TIBC, IRON, RETICCTPCT in the last 72 hours. Microbiology Recent Results (from the past 240 hour(s))  SARS Coronavirus 2 (CEPHEID- Performed in [place] hospital lab), Hosp Order     Status: None   Collection Time: 07/02/18  9:03 PM  Result Value Ref Range Status   SARS Coronavirus 2 NEGATIVE NEGATIVE Final    Comment: (NOTE) If result is NEGATIVE SARS-CoV-2 target nucleic acids are NOT DETECTED. The SARS-CoV-2 RNA is generally detectable in upper and lower  respiratory specimens during the acute phase of infection. The lowest  concentration of SARS-CoV-2 viral copies this assay can detect is 250  copies / mL. A negative result does not preclude SARS-CoV-2 infection  and should not be used as the sole basis for treatment or other  patient management decisions.  A negative result may occur with  improper specimen collection / handling, submission of specimen other  than nasopharyngeal swab, presence of viral mutation(s) within the  areas targeted by this assay, and inadequate number of viral copies  (<250 copies / mL). A negative result must be combined with clinical  observations, patient history, and epidemiological information. If result is POSITIVE SARS-CoV-2 target nucleic acids are DETECTED. The SARS-CoV-2 RNA is generally detectable in upper and lower  respiratory specimens dur ing the acute phase of infection.  Positive  results are indicative of active infection with SARS-CoV-2.  Clinical  correlation with patient history and other diagnostic information is  necessary to determine patient infection status.  Positive results do  not rule out bacterial infection or co-infection  with other viruses. If result is PRESUMPTIVE POSTIVE SARS-CoV-2 nucleic acids MAY BE PRESENT.   A presumptive positive result was obtained on the submitted specimen  and confirmed on repeat testing.  While 2019 novel coronavirus  (SARS-CoV-2) nucleic acids may be present in the submitted sample  additional confirmatory testing may be necessary for epidemiological  and / or clinical management purposes  to differentiate between  SARS-CoV-2 and other Sarbecovirus currently known to infect humans.  If clinically indicated additional testing with an alternate test  methodology [id number]) is advised. The SARS-CoV-2 RNA is generally  detectable in upper and lower respiratory sp ecimens during the acute  phase of infection. The expected result is Negative. Fact Sheet for Patients:  StrictlyIdeas.no Fact Sheet for Healthcare Providers: BankingDealers.co.za This test is not yet approved or cleared by the [place] FDA and has been authorized for detection and/or diagnosis  of SARS-CoV-2 by FDA under an Emergency Use Authorization (EUA).  This EUA will remain in effect (meaning this test can be used) for the duration of the COVID-19 declaration under Section 564(b)(1) of the Act, 21 U.S.C. section 360bbb-3(b)(1), unless the authorization is terminated or revoked sooner. Performed at [facility], [place] [address], [place], [place] [address]      Discharge Instructions:   Discharge Instructions    Diet general   Complete by:  As directed    Increase activity slowly   Complete by:  As directed      Allergies as of 07/04/2018      Reactions   Keflex [cephalexin] Anaphylaxis   Flagyl [metronidazole] Diarrhea      Medication List    STOP taking these medications   cyclobenzaprine 5 MG tablet Commonly known as:  FLEXERIL   gabapentin 300 MG capsule Commonly known as:  NEURONTIN   promethazine 25 MG tablet Commonly known as:   PHENERGAN   propranolol 20 MG tablet Commonly known as:  INDERAL   traZODone 50 MG tablet Commonly known as:  DESYREL     TAKE these medications   albuterol 108 (90 Base) MCG/ACT inhaler Commonly known as:  Ventolin HFA Inhale 1-2 puffs into the lungs every 4 (four) hours as needed for wheezing or shortness of breath. What changed:  Another medication with the same name was removed. Continue taking this medication, and follow the directions you see here.   ARIPiprazole 5 MG tablet Commonly known as:  ABILIFY Take 4 tablets (20 mg total) by mouth at bedtime. For mood control   beclomethasone 40 MCG/ACT inhaler Commonly known as:  QVAR Inhale 1 puff into the lungs 2 (two) times daily as needed (asthma).   Botox 100 units Solr injection Generic drug:  botulinum toxin Type A MD TO INJECT 155 UNITS INTRAMUSCULARLY INTO HEAD AND NECK MUSCLES EVERY 3 MONTHS BY PROVIDER What changed:  See the new instructions.   Fremanezumab-vfrm 225 MG/1.5ML Sosy Commonly known as:  Ajovy Inject 225 mg into the skin every 30 (thirty) days.   HYDROcodone-acetaminophen 10-325 MG tablet Commonly known as:  NORCO Take 1 tablet by mouth 2 (two) times daily as needed for moderate pain.   hydrOXYzine 50 MG tablet Commonly known as:  ATARAX/VISTARIL Take 1 tablet (50 mg total) by mouth 2 (two) times daily as needed for anxiety. What changed:  when to take this   lithium carbonate 450 MG CR tablet Commonly known as:  ESKALITH Take 900 mg by mouth at bedtime.   LORazepam 0.5 MG tablet Commonly known as:  ATIVAN Take 0.5 mg by mouth every 8 (eight) hours.   nystatin 100000 UNIT/ML suspension Commonly known as:  MYCOSTATIN Take 5 mLs (500,000 Units total) by mouth 4 (four) times daily.   SUMAtriptan 100 MG tablet Commonly known as:  IMITREX Take 1 tab at onset of migraine.  May repeat in 2 hrs, if needed.  Max dose: 2 tabs/day or 3 per week. This is a 30 day prescription. No early refills. What  changed:    how much to take  how to take this  when to take this  reasons to take this  additional instructions   zolpidem 10 MG tablet Commonly known as:  AMBIEN Take 10 mg by mouth at bedtime.      Follow-up Information    [name], MD Follow up in 1 week(s).   Specialty:  Internal Medicine Why:  needs EKG Contact  information: [place] [address] [phone]            Time coordinating discharge: 25 min  Signed:  Geradine Girt DO  [facility] 07/04/2018, 11:34 AM

## 2018-07-11 ENCOUNTER — Telehealth: Payer: Self-pay | Admitting: Neurology

## 2018-07-11 NOTE — Telephone Encounter (Signed)
I called to rs left a VM asking her to call us back. If she calls back please make her aware Dr. Krista Blue is out of office Thursday.

## 2018-07-12 ENCOUNTER — Telehealth: Payer: Self-pay | Admitting: Neurology

## 2018-07-12 NOTE — Telephone Encounter (Signed)
Called patient and left her a message asking her to call back about Botox apt. I relayed on message that we would CX her apt for Thursday and when she calls back we will get her another injection.

## 2018-07-13 ENCOUNTER — Ambulatory Visit: Payer: Self-pay | Admitting: Neurology

## 2018-07-16 ENCOUNTER — Emergency Department (HOSPITAL_COMMUNITY): Payer: No Typology Code available for payment source

## 2018-07-16 ENCOUNTER — Encounter (HOSPITAL_COMMUNITY): Payer: Self-pay

## 2018-07-16 ENCOUNTER — Other Ambulatory Visit: Payer: Self-pay

## 2018-07-16 ENCOUNTER — Emergency Department (HOSPITAL_COMMUNITY)
Admission: EM | Admit: 2018-07-16 | Discharge: 2018-07-16 | Disposition: A | Discharge status: Home / Self Care | Payer: No Typology Code available for payment source | Attending: Emergency Medicine | Admitting: Emergency Medicine

## 2018-07-16 DIAGNOSIS — J45909 Unspecified asthma, uncomplicated: Secondary | ICD-10-CM | POA: Diagnosis not present

## 2018-07-16 DIAGNOSIS — Z79899 Other long term (current) drug therapy: Secondary | ICD-10-CM | POA: Diagnosis not present

## 2018-07-16 DIAGNOSIS — Y9389 Activity, other specified: Secondary | ICD-10-CM | POA: Insufficient documentation

## 2018-07-16 DIAGNOSIS — S0990XA Unspecified injury of head, initial encounter: Secondary | ICD-10-CM | POA: Diagnosis present

## 2018-07-16 DIAGNOSIS — F17210 Nicotine dependence, cigarettes, uncomplicated: Secondary | ICD-10-CM | POA: Insufficient documentation

## 2018-07-16 DIAGNOSIS — Y92410 Unspecified street and highway as the place of occurrence of the external cause: Secondary | ICD-10-CM | POA: Insufficient documentation

## 2018-07-16 DIAGNOSIS — Y999 Unspecified external cause status: Secondary | ICD-10-CM | POA: Insufficient documentation

## 2018-07-16 DIAGNOSIS — S161XXA Strain of muscle, fascia and tendon at neck level, initial encounter: Secondary | ICD-10-CM | POA: Diagnosis not present

## 2018-07-16 LAB — I-STAT BETA HCG BLOOD, ED (MC, WL, AP ONLY): I-stat hCG, quantitative: <5 m[IU]/mL (ref <5)

## 2018-07-16 MED ORDER — FENTANYL CITRATE (PF) 100 MCG/2ML IJ SOLN
50.0000 ug | Freq: Once | INTRAMUSCULAR | Status: AC
  Administered 2018-07-16: 50 ug via INTRAVENOUS
  Filled 2018-07-16: qty 2

## 2018-07-16 MED ORDER — ONDANSETRON HCL 4 MG/2ML IJ SOLN
4.0000 mg | Freq: Once | INTRAMUSCULAR | Status: AC
  Administered 2018-07-16: 17:00:00 4 mg via INTRAVENOUS
  Filled 2018-07-16: qty 2

## 2018-07-16 MED ORDER — KETOROLAC TROMETHAMINE 30 MG/ML IJ SOLN
30.0000 mg | Freq: Once | INTRAMUSCULAR | Status: AC
  Administered 2018-07-16: 30 mg via INTRAVENOUS
  Filled 2018-07-16: qty 1

## 2018-07-16 MED ORDER — SODIUM CHLORIDE 0.9 % IV BOLUS
1000.0000 mL | Freq: Once | INTRAVENOUS | Status: AC
  Administered 2018-07-16: 1000 mL via INTRAVENOUS
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
## 2018-07-18 NOTE — Telephone Encounter (Signed)
Noted, thank you. DW  °

## 2018-07-18 NOTE — Telephone Encounter (Signed)
I scheduled the patients apt and called to make her aware but she did not answer. If she calls back please confirm this apt works. DW

## 2018-07-31 ENCOUNTER — Encounter: Payer: Self-pay | Admitting: Neurology

## 2018-07-31 ENCOUNTER — Telehealth: Payer: Self-pay | Admitting: Neurology

## 2018-07-31 ENCOUNTER — Other Ambulatory Visit: Payer: Self-pay

## 2018-07-31 ENCOUNTER — Ambulatory Visit: Payer: Medicaid Other | Admitting: Neurology

## 2018-07-31 VITALS — BP 104/78 | HR 74 | Temp 96.6°F | Ht 71.0 in | Wt 175.3 lb

## 2018-07-31 DIAGNOSIS — G43719 Chronic migraine without aura, intractable, without status migrainosus: Secondary | ICD-10-CM | POA: Diagnosis not present

## 2018-07-31 NOTE — Telephone Encounter (Signed)
3 mo botox inj

## 2018-07-31 NOTE — Progress Notes (Signed)
Botox- 100 units x 2 vials Lot: [id number] Expiration: 01/2021 NDC: 9093-1121-62  Bacteriostatic 0.9% Sodium Chloride- 53mL total S/P

## 2018-07-31 NOTE — Progress Notes (Signed)
Chief Complaint  Patient presents with  . Botulinum Toxin Injection      PATIENT: Carmen Daniels DOB: Oct 15, 1985  Chief Complaint  Patient presents with  . Botulinum Toxin Injection     HISTORICAL  Carmen Daniels is a 33 years old right-handed female, accompanied by her mother,, seen in refer by her pain management doctor Dr. Nelva Bush for evaluation of migraines  I have reviewed most recent office note in April 2016, she has past medical history of bipolar disorder, complains of frequent headaches, diffuse body achy pain, UDS was negative,   She complains of migraine headaches since 2011, her typical migraine started with tunnel vision, lasting for 10-15 minutes, followed by severe pounding headache with associated light noise sensitivity, sumatriptan 100 mg as needed has been very helpful, but sometimes she require repeat dosage. She is now having 4-5 typical prolonged migraines each week, lasting for 1 day,  Trigger for her migraines are stress, sleep deprivation,  She has tried over-the-counter aspirin, Tylenol, NSAIDs Excedrin Migraine, without helping her headaches, she has seen different neurologists in the past, has tried different preventive medications, including beta blocker, calcium channel blocker without helping, currently she is taking Topamax 25 mg 7 to 8 tablets every night, which is adjusted by her psychologist Dr. 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 No white matter disease or large vessel/lacunar infarction.  UPDATE Mar 27 2015: She is now taking Topamax 25 mg tablets 8 tablets every night, still has frequent migraine headaches, also consider Botox injection as migraine prevention, she has variable frequency of migraine, few weeks  ago, she had 3 weeks of daily headaches, Imitrex as needed is helpful in 60 minutes, she still has dull achy pain, but no longer has the intense headaches.  used up all 9 tablets of allowed Imitrex each month. She usually has visual aura proceeding each migraine headaches,  She is on polypharmacy for her mood disorder, currently taking Topamax, latuda, seroquel, Xanax, Abilify  UPDATE September 04 2015: She had her first Botox injection as migraine prevention in May 05 2015, she had frequent headache postinjection for 2 weeks, eventually she noticed a benefit, she has much less, less severe migraine, but even with the injection she has 2-3 migraines each week, noticed wearing off at the end of 3 months.  UPDATE Nov 28th 2017: She is having frequent headaches, couple times a week, reported 50% improvement with Botox injection, has much less frequent and less severe headaches. 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   UPDATE Jul 21 2016: She responded well to previous injection on April 21 2016, she did not notice significant side effect, she reported less headache, headache has also become less intense,  She also tried Toradol IM injection for one  prolonged headaches, which helps well for her, she used to her monthly supply of Imitrex tablets,  UPDATE October 05 2016: I reviewed her [place] controlled substance registry, in July 2018, she got tramadol  60 tablets, hydrocodone 10/Tylenol 60 tablets, Xanax 1 mg 120 tablets, tramadol again 60 tablets,   In June 2018, hydrocodone/Tylenol 10/325 mg 60 tablets, Xanax 1 mg 120 tablets, tramadol 30 tablet hydrocodone syrup 112, tramadol 30 tablets  I also reviewed her ED presentation of August 12th 2018, she slipped and fell forward face down, mild transient loss of consciousness, she had persistent headache, neck pain,nausea vomiting, blurry vision since the injury,  She also hurt her left great toe, with a black and  bruise, complains of pain,  I personally reviewed CT head without contrast August twelfth 2018, which showed no significant abnormality, CT cervical and facial/sinus showed no evidence of fracture  UPDATE Sept 5 2018: She went well with previous Botox injection, did has frequent headaches during her concussion October 03 2016.  UPDATE Feb 10 2017: She responded very well to Botox injection as migraine prevention  UPDATE Jun 29 2017: She was admitted to the hospital for depression suicidal attempt, now with increased migraine headaches,  UPDATE October 12 2017: She is now back home with her parents, used up all her allowed imitrex each month.  Insurance company also approved Ajovy for her  UPDATE Jan 11 2018: She is doing well with Ajovy and BOTOX, has migraine once a week, well controlled by imitrex  UPDATE Apr 12 2018: She responded well to the combination therapy of Ajovy and Botox as migraine prevention,  UPDATE June 8th 2020: She responded very well to previous injection, there was no significant side effect noted  REVIEW OF SYSTEMS: Full 14 system review of systems performed and notable only for as above  ALLERGIES: Allergies  Allergen Reactions  . Keflex [Cephalexin] Anaphylaxis  . Flagyl [Metronidazole] Diarrhea    HOME MEDICATIONS: Current Outpatient Prescriptions  Medication Sig Dispense Refill  . ALPRAZolam (XANAX) 1 MG tablet Take 1 mg by mouth 4 (four)  times daily.  2  . ARIPiprazole (ABILIFY) 30 MG tablet Take 30 mg by mouth daily.  3  . Eszopiclone 3 MG TABS Take 3 mg by mouth at bedtime.  3  . HYDROcodone-acetaminophen (NORCO) 10-325 MG per tablet Take 1 tablet by mouth 2 (two) times daily as needed. for pain  0  . LATUDA 40 MG TABS tablet TAKE 1 TABLET EVERY EVENING WITH FOOD  1  . promethazine (PHENERGAN) 25 MG tablet Take 25 mg by mouth 3 (three) times daily as needed.  0  . QUEtiapine (SEROQUEL) 25 MG tablet Take 25 mg by mouth at bedtime.  2  . SUMAtriptan (IMITREX) 100 MG tablet TAKE 1 TABLET BY MOUTH. MAY TAKE REPEAT WITH 1 TABLET IN TWO HOURS IF NEEDED  5  . topiramate (TOPAMAX) 25 MG tablet TAKE 7-8 TABLETS AT BEDTIME  0     PAST MEDICAL HISTORY: Past Medical History:  Diagnosis Date  . Allergic rhinitis   . Anxiety   . Asthma   . Back pain   . Bipolar disorder ([place])   . Chronic pain   . Migraines   . Nasal polyps   . Nausea   . Neck pain   . OCD (obsessive compulsive disorder)     PAST SURGICAL HISTORY: Past Surgical History:  Procedure Laterality Date  . NASAL SINUS SURGERY    . WISDOM TOOTH EXTRACTION  FAMILY HISTORY: Family History  Problem Relation Age of Onset  . Healthy Father   . Healthy Mother   . Arthritis Maternal Grandmother   . Arthritis Maternal Grandfather   . Depression Paternal Grandmother     SOCIAL HISTORY:  Social History   Socioeconomic History  . Marital status: Married    Spouse name: Not on file  . Number of children: 0  . Years of education: 54  . Highest education level: Not on file  Occupational History  . Occupation: Unemployed  Social Needs  . Financial resource strain: Not on file  . Food insecurity:    Worry: Not on file    Inability: Not on file  . Transportation needs:    Medical: Not on file    Non-medical: Not on file  Tobacco Use  . Smoking status: Current Every Day Smoker    Packs/day: 0.50    Types: Cigarettes  . Smokeless tobacco: Never Used   Substance and Sexual Activity  . Alcohol use: Yes    Alcohol/week: 0.0 standard drinks    Comment: Occasional alcohol use  . Drug use: No  . Sexual activity: Yes    Birth control/protection: Condom  Lifestyle  . Physical activity:    Days per week: Not on file    Minutes per session: Not on file  . Stress: Not on file  Relationships  . Social connections:    Talks on phone: Not on file    Gets together: Not on file    Attends religious service: Not on file    Active member of club or organization: Not on file    Attends meetings of clubs or organizations: Not on file    Relationship status: Not on file  . Intimate partner violence:    Fear of current or ex partner: Not on file    Emotionally abused: Not on file    Physically abused: Not on file    Forced sexual activity: Not on file  Other Topics Concern  . Not on file  Social History Narrative   Lives at home alone.   Right-handed.   3 glasses of green tea per day.    PHYSICAL EXAM   There were no vitals filed for this visit.  Not recorded      There is no height or weight on file to calculate BMI.  PHYSICAL EXAMNIATION: External tinnitus radiating to the NEUROLOGICAL EXAM:  MMSE - Mini Mental State Exam 10/05/2016  Orientation to time 4  Orientation to Place 5  Registration 3  Attention/ Calculation 5  Recall 1  Language- name 2 objects 2  Language- repeat 1  Language- follow 3 step command 3  Language- read & follow direction 1  Write a sentence 1  Copy design 0  Total score 26  animal naming 6.   CRANIAL NERVES: CN II: Visual fields are full to confrontation. During the conversation continue atenolol pupils are round equal and briskly reactive to light. CN III, IV, VI: extraocular movement are normal. No ptosis. CN V: Facial sensation is intact to pinprick in all 3 divisions bilaterally. Corneal responses are intact.  CN VII: Face is symmetric with normal eye closure and smile. CN VIII: Hearing  is normal to rubbing fingers CN IX, X: Palate elevates symmetrically. Phonation is normal. CN XI: Head turning and shoulder shrug are intact CN XII: Tongue is midline with normal movements and no atrophy.  MOTOR: Moves 4 extremities without difficulty  COORDINATION: There is no  dysmetria noted  GAIT/STANCE: Normal study    DIAGNOSTIC DATA (LABS, IMAGING, TESTING) - I reviewed patient records, labs, notes, testing and imaging myself where available.   ASSESSMENT AND PLAN  AKEIA PEROT is a 33 y.o. female    Chronic migraine headaches with aura, s/p concussion on October 03 2016.  Patient has been treated with Botox injection as migraine prevention for extended period of time, which definitely has helped her migraine headaches, without Botox injection, she would have prolonged, very frequent migraines, difficult to control with home medications,         Exta 45 units: 10 units into eac masseters muscles, Extra 25 units were injected into bilateral cervical paraspinals.  Her migraine headache overall has much improved with combination of Ajovy, and Botox injection   Marcial Pacas, M.D. Ph.D.  [facility] [address], [place], [place] [address] Ph: [phone] Fax: [phone]  CC: To Dr. [name], Dr. Nelva Bush, Delfino Lovett

## 2018-08-01 NOTE — Telephone Encounter (Signed)
I called to schedule the patient for her 3 month injection after September 8th. She did not answer so I left a VM asking her to call back. DW

## 2018-09-19 ENCOUNTER — Telehealth: Payer: Self-pay | Admitting: *Deleted

## 2018-09-19 NOTE — Telephone Encounter (Signed)
PA for Ajovy 225mg  submitted through [facility]. Pt VO#[phone] R.  KB#[id number] W.  Decision pending.

## 2018-09-19 NOTE — Telephone Encounter (Signed)
Approved through 09/14/2019.

## 2018-10-22 ENCOUNTER — Other Ambulatory Visit: Payer: Self-pay | Admitting: Neurology

## 2018-11-08 ENCOUNTER — Emergency Department (HOSPITAL_COMMUNITY)
Admission: EM | Admit: 2018-11-08 | Discharge: 2018-11-09 | Disposition: A | Discharge status: Home / Self Care | Payer: Medicaid Other | Attending: Emergency Medicine | Admitting: Emergency Medicine

## 2018-11-08 ENCOUNTER — Other Ambulatory Visit: Payer: Self-pay

## 2018-11-08 DIAGNOSIS — J45909 Unspecified asthma, uncomplicated: Secondary | ICD-10-CM | POA: Diagnosis not present

## 2018-11-08 DIAGNOSIS — R441 Visual hallucinations: Secondary | ICD-10-CM | POA: Diagnosis not present

## 2018-11-08 DIAGNOSIS — Z79899 Other long term (current) drug therapy: Secondary | ICD-10-CM | POA: Insufficient documentation

## 2018-11-08 DIAGNOSIS — F17210 Nicotine dependence, cigarettes, uncomplicated: Secondary | ICD-10-CM | POA: Diagnosis not present

## 2018-11-08 DIAGNOSIS — F315 Bipolar disorder, current episode depressed, severe, with psychotic features: Secondary | ICD-10-CM | POA: Diagnosis not present

## 2018-11-08 DIAGNOSIS — R443 Hallucinations, unspecified: Secondary | ICD-10-CM

## 2018-11-08 DIAGNOSIS — Z046 Encounter for general psychiatric examination, requested by authority: Secondary | ICD-10-CM | POA: Diagnosis present

## 2018-11-08 LAB — I-STAT BETA HCG BLOOD, ED (MC, WL, AP ONLY): I-stat hCG, quantitative: <5 m[IU]/mL (ref <5)

## 2018-11-09 LAB — ETHANOL: Alcohol, Ethyl (B): <10 mg/dL (ref <10)

## 2018-11-09 LAB — CBC
HCT: 46.2 % — ABNORMAL HIGH (ref 36.0–46.0)
Hemoglobin: 15.4 g/dL — ABNORMAL HIGH (ref 12.0–15.0)
MCH: 31.2 pg (ref 26.0–34.0)
MCHC: 33.3 g/dL (ref 30.0–36.0)
MCV: 93.5 fL (ref 80.0–100.0)
Platelets: 337 10*3/uL (ref 150–400)
RBC: 4.94 MIL/uL (ref 3.87–5.11)
RDW: 12.4 % (ref 11.5–15.5)
WBC: 11.2 10*3/uL — ABNORMAL HIGH (ref 4.0–10.5)
nRBC: 0 % (ref 0.0–0.2)

## 2018-11-09 LAB — COMPREHENSIVE METABOLIC PANEL
ALT: 21 U/L (ref 0–44)
AST: 21 U/L (ref 15–41)
Albumin: 4.7 g/dL (ref 3.5–5.0)
Alkaline Phosphatase: 64 U/L (ref 38–126)
Anion gap: 12 (ref 5–15)
BUN: 16 mg/dL (ref 6–20)
CO2: 22 mmol/L (ref 22–32)
Calcium: 9.7 mg/dL (ref 8.9–10.3)
Chloride: 101 mmol/L (ref 98–111)
Creatinine, Ser: 0.75 mg/dL (ref 0.44–1.00)
GFR calc Af Amer: >60 mL/min (ref >60)
GFR calc non Af Amer: >60 mL/min (ref >60)
Glucose, Bld: 100 mg/dL — ABNORMAL HIGH (ref 70–99)
Potassium: 3.8 mmol/L (ref 3.5–5.1)
Sodium: 135 mmol/L (ref 135–145)
Total Bilirubin: 1.6 mg/dL — ABNORMAL HIGH (ref 0.3–1.2)
Total Protein: 8.4 g/dL — ABNORMAL HIGH (ref 6.5–8.1)

## 2018-11-09 LAB — RAPID URINE DRUG SCREEN, HOSP PERFORMED
Amphetamines: NOT DETECTED
Barbiturates: NOT DETECTED
Benzodiazepines: POSITIVE — AB
Cocaine: NOT DETECTED
Opiates: NOT DETECTED
Tetrahydrocannabinol: NOT DETECTED

## 2018-11-09 LAB — LITHIUM LEVEL: Lithium Lvl: 1.07 mmol/L (ref 0.60–1.20)

## 2018-11-09 NOTE — ED Provider Notes (Signed)
[facility] DEPT Provider Note   CSN: [id number] Arrival date & time: 11/08/18  2107     History   Chief Complaint Chief Complaint  Patient presents with  . Psychiatric Evaluation    HPI Carmen Daniels is a 33 y.o. female with history of bipolar disorder, OCD, migraines, chronic pain syndrome, borderline personality disorder presents for evaluation of intermittent hallucinations.  She reports they have been ongoing for years.  She describes seeing "shadow people" that occur intermittently, most recently experienced for a few hours earlier today but has since resolved.  She states that she is primarily in the ED to see if she may need to adjust any of her medications specifically her lithium.  She reports medication compliance with all of her medicines.  She denies suicidal ideation or homicidal ideation.  She appears quite calm and cooperative on my assessment.  She reports that she lives with her ex-husband but has a good relationship with him and lives across the street from her parents so that "when I have a bad day, I can go to them".  She reports feeling safe at home and has good social support.  She notes that she has very good access to her PCP who she sees regularly who prescribes her lithium.  She denies any medical complaints at this time including fever, cough, shortness of breath, chest pain, or abdominal pain.  She reports that she is chronically tachycardic "because I am always anxious ".     The history is provided by the patient.    Past Medical History:  Diagnosis Date  . Allergic rhinitis   . Anxiety   . Asthma   . Back pain   . Bipolar disorder ([place])   . Chronic pain   . Migraines   . Nasal polyps   . Nausea   . Neck pain   . OCD (obsessive compulsive disorder)     Patient Active Problem List   Diagnosis Date Noted  . QT prolongation 07/03/2018  . AMS (altered mental status) 07/03/2018  . Serum total bilirubin elevated  07/03/2018  . Acute rhinosinusitis 07/03/2018  . Syncope 07/02/2018  . Sinus congestion 04/13/2017  . Borderline personality disorder ([place]) 04/11/2017  . Bipolar I disorder, current or most recent episode manic, with psychotic features ([place]) 04/10/2017  . Bipolar disorder ([place]) 04/10/2017  . Abnormal MRI of head 04/21/2016  . Chronic migraine without aura 03/27/2015  . Mild persistent asthma 12/30/2014  . Allergic rhinitis due to pollen 12/30/2014  . 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 Nasal polyposis 12/30/2014    Past Surgical History:  Procedure Laterality Date  . NASAL SINUS SURGERY    . WISDOM TOOTH EXTRACTION       OB History   No obstetric history on file.      Home Medications    Prior to Admission medications   Medication Sig Start Date End Date Taking? Authorizing Provider  AJOVY 225 MG/1.5ML SOSY INJECT 225 MG INTO THE SKIN EVERY 30 (THIRTY) DAYS. Patient taking differently: Inject 225 mg into the skin every 30 (thirty) days.  10/23/18  Yes Marcial Pacas, MD  albuterol (VENTOLIN HFA) 108 (90 Base) MCG/ACT inhaler Inhale 1-2 puffs into the lungs every 4 (four) hours as needed for wheezing or shortness of breath. 04/15/17  Yes Money, Lowry Ram, FNP  beclomethasone (QVAR) 40 MCG/ACT inhaler Inhale 1 puff into the lungs 2 (two) times daily as needed (asthma).    Yes [provider]  BOTOX 100 units SOLR injection MD TO INJECT 155 UNITS INTRAMUSCULARLY INTO HEAD AND NECK MUSCLES EVERY 3 MONTHS BY PROVIDER Patient taking differently: Inject 155 Units into the muscle every 3 (three) months. Head and neck muscles for migraine prevention 04/21/18  Yes Marcial Pacas, MD  clonazePAM (KLONOPIN) 1 MG tablet Take 1 mg by mouth 4 (four) times daily.   Yes [provider]  HYDROcodone-acetaminophen (NORCO) 10-325 MG tablet Take 1 tablet by mouth 2 (two) times daily as needed for moderate pain.   Yes [provider]  lithium carbonate (ESKALITH) 450 MG CR tablet  Take 900 mg by mouth at bedtime.   Yes [provider]  SUMAtriptan (IMITREX) 100 MG tablet TAKE 1 TABLET AT ONSET MAY REPEAT IN 2 HOURS IF NEEDED MAY DOSE 2 PER DAY OR 3 Patient taking differently: Take 100 mg by mouth every 2 (two) hours as needed for migraine. MAY DOSE 2 PER DAY OR 3 10/23/18  Yes Marcial Pacas, MD  zolpidem (AMBIEN) 10 MG tablet Take 10 mg by mouth at bedtime as needed for sleep.    Yes [provider]  ARIPiprazole (ABILIFY) 5 MG tablet Take 4 tablets (20 mg total) by mouth at bedtime. For mood control Patient not taking: Reported on 11/08/2018 07/04/18   Geradine Girt, DO  hydrOXYzine (ATARAX/VISTARIL) 50 MG tablet Take 1 tablet (50 mg total) by mouth 2 (two) times daily as needed for anxiety. Patient not taking: Reported on 11/08/2018 07/04/18   Geradine Girt, DO  nystatin (MYCOSTATIN) 100000 UNIT/ML suspension Take 5 mLs (500,000 Units total) by mouth 4 (four) times daily. Patient not taking: Reported on 11/08/2018 07/04/18   Geradine Girt, DO    Family History Family History  Problem Relation Age of Onset  . Healthy Father   . Healthy Mother   . Arthritis Maternal Grandmother   . Arthritis Maternal Grandfather   . Depression Paternal Grandmother     Social History Social History   Tobacco Use  . Smoking status: Current Every Day Smoker    Packs/day: 0.50    Types: Cigarettes  . Smokeless tobacco: Never Used  Substance Use Topics  . Alcohol use: Yes    Alcohol/week: 0.0 standard drinks    Comment: Occasional alcohol use  . Drug use: No     Allergies   Keflex [cephalexin] and Flagyl [metronidazole]   Review of Systems Review of Systems  Constitutional: Negative for chills and fever.  Respiratory: Negative for shortness of breath.   Cardiovascular: Negative for chest pain.  Gastrointestinal: Negative for abdominal pain.  Psychiatric/Behavioral: Positive for hallucinations. Negative for suicidal ideas.  All other systems reviewed  and are negative.    Physical Exam Updated Vital Signs BP 116/76 (BP Location: Left Arm)   Pulse (!) 103   Temp 99.9 F (37.7 C) (Oral)   Resp 16   Ht 5\' 11"  (1.803 m)   Wt 81.6 kg   LMP 10/12/2018   SpO2 99%   BMI 25.10 kg/m   Physical Exam Vitals signs and nursing note reviewed.  Constitutional:      General: She is not in acute distress.    Appearance: She is well-developed.     Comments: Dressed in purple scrubs, hair and nails are clean and well groomed.  Resting comfortably in bed, pleasant affect, makes good eye contact  HENT:     Head: Normocephalic and atraumatic.  Eyes:     General:  Right eye: No discharge.        Left eye: No discharge.     Conjunctiva/sclera: Conjunctivae normal.  Neck:     Vascular: No JVD.     Trachea: No tracheal deviation.  Cardiovascular:     Rate and Rhythm: Tachycardia present.     Comments: Mildly tachycardic Pulmonary:     Effort: Pulmonary effort is normal.     Breath sounds: Normal breath sounds.  Abdominal:     General: Bowel sounds are normal. There is no distension.     Palpations: Abdomen is soft.     Tenderness: There is no abdominal tenderness. There is no guarding.  Skin:    General: Skin is warm and dry.     Findings: No erythema.  Neurological:     Mental Status: She is alert.  Psychiatric:        Attention and Perception: Attention and perception normal.        Mood and Affect: Mood normal.        Behavior: Behavior normal. Behavior is cooperative.        Thought Content: Thought content does not include homicidal or suicidal ideation. Thought content does not include homicidal or suicidal plan.        Cognition and Memory: Cognition normal.     Comments: Does not appear to be responding to internal stimuli at this time.      ED Treatments / Results  Labs (all labs ordered are listed, but only abnormal results are displayed) Labs Reviewed  COMPREHENSIVE METABOLIC PANEL - Abnormal; Notable for the  following components:      Result Value   Glucose, Bld 100 (*)    Total Protein 8.4 (*)    Total Bilirubin 1.6 (*)    All other components within normal limits  CBC - Abnormal; Notable for the following components:   WBC 11.2 (*)    Hemoglobin 15.4 (*)    HCT 46.2 (*)    All other components within normal limits  RAPID URINE DRUG SCREEN, HOSP PERFORMED - Abnormal; Notable for the following components:   Benzodiazepines POSITIVE (*)    All other components within normal limits  ETHANOL  LITHIUM LEVEL  I-STAT BETA HCG BLOOD, ED ([facility], [facility], [facility] ONLY)    EKG None  Radiology No results found.  Procedures Procedures (including critical care time)  Medications Ordered in ED Medications - No data to display   Initial Impression / Assessment and Plan / ED Course  I have reviewed the triage vital signs and the nursing notes.  Pertinent labs & imaging results that were available during my care of the patient were reviewed by me and considered in my medical decision making (see chart for details).        Patient presents requesting medication management related to her intermittent visual hallucinations.  She is afebrile, mildly tachycardic but reports this is baseline for her.  She is overall quite well-appearing, physical examination is reassuring.  She denies suicidal ideation or homicidal ideation or associated plans.  She appears to have good interoceptive awareness and does not appear to be acutely manic at this time.  In fact from a psychiatric perspective she is asymptomatic presently.  Labs reviewed by me show mild nonspecific leukocytosis but her hemoglobin hematocrit are also elevated so she could be somewhat dehydrated.  No renal insufficiency, no metabolic derangements.  UDS is positive for benzodiazepines but she is prescribed Klonopin.  Her lithium level is within the therapeutic  range.  She does not appear to be an imminent threat to herself or others at this time and I think  that she is stable for discharge home with close follow-up with her PCP she reports she can get in to be seen very quickly.  She has good social support and feels safe at home. She will call her PCP tomorrow for further recommendations.  Discussed strict ED return precautions. Patient verbalized understanding of and agreement with plan and is safe for discharge home at this time.   Final Clinical Impressions(s) / ED Diagnoses   Final diagnoses:  Hallucinations    ED Discharge Orders    None       Debroah Baller 11/09/18 0103    Palumbo, April, MD 11/09/18 0111

## 2018-11-09 NOTE — ED Triage Notes (Signed)
Pt reports intermittent hallucinations described as "Shadow People" pt states "I know they're not there but still they're not the type you want to be alone with." pt reports compliance with all meds. Pt denies any AH/VH at this time

## 2018-11-09 NOTE — BH Assessment (Signed)
Tele Assessment Note   Patient Name: Carmen Daniels MRN: [id number] Referring Physician: Rodell Perna, PA Location of Patient: [facility] Location of Provider: [facility] is an 33 y.o. female.  -Clinician reviewed note by Rodell Perna, PA.  Carmen Daniels is a 33 y.o. female with history of bipolar disorder, OCD, migraines, chronic pain syndrome, borderline personality disorder presents for evaluation of intermittent hallucinations.  She reports they have been ongoing for years.  She describes seeing "shadow people" that occur intermittently, most recently experienced for a few hours earlier today but has since resolved.  She states that she is primarily in the ED to see if she may need to adjust any of her medications specifically her lithium.  She reports medication compliance with all of her medicines.  She denies suicidal ideation or homicidal ideation.  She appears quite calm and cooperative on my assessment.  She reports that she lives with her ex-husband but has a good relationship with him and lives across the street from her parents so that "when I have a bad day, I can go to them".  She reports feeling safe at home and has good social support.  She notes that she has very good access to her PCP who she sees regularly who prescribes her lithium.    Patient reports that earlier in the evening she was seeing "shadow people."  This happens intermittently for her.  She said it has been awhile since she had this hallucination.  Patient knows that these visual hallucinations are not real.  She is not currently seeing things.  Patient had her mother to bring her to the ED.  Patient denies any SI, HI or auditory hallucinations.  Patient is pleasant, cooperative.  Eye contact is good.  She reports some ongoing anxiety which she says "I have all the time."  Patient mood is calm.  Her thought patterns are logical and coherent.  Patient lives with her ex-husband and her  parents live across the street.  Pt has a lot of support from parents and ex-husband.  Patient has her medications prescribed & managed by her PCP.  She is fine with talking to him tomorrow about adjustments to meds if needed.  Patient is seen for therapy by Josph Macho May.  She was at [facility] in 03/2017.    -Clinician discussed patient care with Anette Riedel, NP.  She said patient does not meet inpatient criteria.  Clinician informed PA Mia of disposition.  Pt will be discharged home with parent to pick her up.  Diagnosis: F31.5 Bipolar 1 d/o w/ psychotic features  Past Medical History:  Past Medical History:  Diagnosis Date  . Allergic rhinitis   . Anxiety   . Asthma   . Back pain   . Bipolar disorder ([place])   . Chronic pain   . Migraines   . Nasal polyps   . Nausea   . Neck pain   . OCD (obsessive compulsive disorder)     Past Surgical History:  Procedure Laterality Date  . NASAL SINUS SURGERY    . WISDOM TOOTH EXTRACTION      Family History:  Family History  Problem Relation Age of Onset  . Healthy Father   . Healthy Mother   . Arthritis Maternal Grandmother   . Arthritis Maternal Grandfather   . Depression Paternal Grandmother     Social History:  reports that she has been smoking cigarettes. She has been smoking about 0.50 packs per  day. She has never used smokeless tobacco. She reports current alcohol use. She reports that she does not use drugs.  Additional Social History:  Alcohol / Drug Use Pain Medications: See PTA medication list Prescriptions: See PTA medication list Over the Counter: See PTA medication list History of alcohol / drug use?: No history of alcohol / drug abuse(+ for benzos.  Prescribed clonopin.)  CIWA: CIWA-Ar BP: 116/76 Pulse Rate: (!) 103 COWS:    Allergies:  Allergies  Allergen Reactions  . Keflex [Cephalexin] Anaphylaxis  . Flagyl [Metronidazole] Diarrhea    Home Medications: (Not in a hospital admission)   OB/GYN Status:  Patient's  last menstrual period was 10/12/2018.  General Assessment Data Location of Assessment: [facility] TTS Assessment: In system Is this a Tele or Face-to-Face Assessment?: Tele Assessment Is this an Initial Assessment or a Re-assessment for this encounter?: Initial Assessment Patient Accompanied by:: N/A Language Other than English: No Living Arrangements: Other (Comment)(Living w/ ex-husband) What gender do you identify as?: Female Marital status: Divorced Maiden name: [name] Pregnancy Status: No Living Arrangements: Spouse/significant other Can pt return to current living arrangement?: Yes Admission Status: Voluntary Is patient capable of signing voluntary admission?: Yes Referral Source: Self/Family/Friend Insurance type: MCD     Crisis Care Plan Living Arrangements: Spouse/significant other Name of Psychiatrist: None Name of Therapist: Josph Macho May  Education Status Is patient currently in school?: No Is the patient employed, unemployed or receiving disability?: Receiving disability income  Risk to self with the past 6 months Suicidal Ideation: No Has patient been a risk to self within the past 6 months prior to admission? : No Suicidal Intent: No Has patient had any suicidal intent within the past 6 months prior to admission? : No Is patient at risk for suicide?: No Suicidal Plan?: No Has patient had any suicidal plan within the past 6 months prior to admission? : No Access to Means: No What has been your use of drugs/alcohol within the last 12 months?: None Previous Attempts/Gestures: No How many times?: 0 Other Self Harm Risks: None Triggers for Past Attempts: None known Intentional Self Injurious Behavior: None Family Suicide History: No Recent stressful life event(s): Other (Comment)(Migraine headache) Persecutory voices/beliefs?: No Depression: No Depression Symptoms: (No depressive symptoms) Substance abuse history and/or treatment for substance abuse?: No Suicide  prevention information given to non-admitted patients: Not applicable  Risk to Others within the past 6 months Homicidal Ideation: No Does patient have any lifetime risk of violence toward others beyond the six months prior to admission? : No Thoughts of Harm to Others: No Current Homicidal Intent: No Current Homicidal Plan: No Access to Homicidal Means: No Identified Victim: No one History of harm to others?: No Assessment of Violence: None Noted Violent Behavior Description: None reported Does patient have access to weapons?: No Criminal Charges Pending?: No Does patient have a court date: No Is patient on probation?: No  Psychosis Hallucinations: Visual(Will see shadow people) Delusions: None noted  Mental Status Report Appearance/Hygiene: In scrubs, Unremarkable Eye Contact: Good Motor Activity: Freedom of movement, Unremarkable Speech: Logical/coherent Level of Consciousness: Alert Mood: Pleasant Affect: Anxious Anxiety Level: Minimal Thought Processes: Coherent, Relevant Judgement: Unimpaired Orientation: Person, Place, Situation Obsessive Compulsive Thoughts/Behaviors: Minimal  Cognitive Functioning Concentration: Normal Memory: Recent Intact, Remote Intact Is patient IDD: No Insight: Good Impulse Control: Good Appetite: Good Have you had any weight changes? : No Change Sleep: No Change Total Hours of Sleep: 8 Vegetative Symptoms: None  ADLScreening [facility] Assessment Services) Patient's cognitive  ability adequate to safely complete daily activities?: Yes Patient able to express need for assistance with ADLs?: Yes Independently performs ADLs?: Yes (appropriate for developmental age)  Prior Inpatient Therapy Prior Inpatient Therapy: Yes Prior Therapy Dates: 03/2017 Prior Therapy Facilty/Provider(s): [facility] Reason for Treatment: self harm  Prior Outpatient Therapy Prior Outpatient Therapy: Yes Prior Therapy Dates: ongoing Prior Therapy Facilty/Provider(s):  [facility] Reason for Treatment: therapy Does patient have an ACCT team?: No Does patient have Intensive In-House Services?  : No Does patient have [facility] services? : No Does patient have P4CC services?: No  ADL Screening (condition at time of admission) Patient's cognitive ability adequate to safely complete daily activities?: Yes Is the patient deaf or have difficulty hearing?: No Does the patient have difficulty seeing, even when wearing glasses/contacts?: No Does the patient have difficulty concentrating, remembering, or making decisions?: No Patient able to express need for assistance with ADLs?: Yes Does the patient have difficulty dressing or bathing?: No Independently performs ADLs?: Yes (appropriate for developmental age) Does the patient have difficulty walking or climbing stairs?: No Weakness of Legs: None Weakness of Arms/Hands: None       Abuse/Neglect Assessment (Assessment to be complete while patient is alone) Abuse/Neglect Assessment Can Be Completed: Yes Physical Abuse: Denies Verbal Abuse: Denies Sexual Abuse: Denies Exploitation of patient/patient's resources: Denies Self-Neglect: Denies     Regulatory affairs officer (For Healthcare) Does Patient Have a Medical Advance Directive?: No Would patient like information on creating a medical advance directive?: No - Patient declined          Disposition:  Disposition Initial Assessment Completed for this Encounter: Yes Patient referred to: Other (Comment)(Pt psych cleared)  This service was provided via telemedicine using a 2-way, interactive audio and video technology.  Names of all persons participating in this telemedicine service and their role in this encounter. Name: Carmen Daniels Role: patient  Name: Curlene Dolphin, M.S. LCAS QP Role: clinician  Name:  Role:   Name:  Role:     Raymondo Band 11/09/2018 1:28 AM

## 2018-11-09 NOTE — Discharge Instructions (Signed)
Your labs today are reassuring.  They do suggest you may be mildly dehydrated to make sure to drink plenty of water and get plenty of rest.  Please call your PCP and/or your psychiatrist first thing in the morning to set up follow-up appointment for possible medication adjustments.  Your lithium levels today were normal but you may need different medications or other resources for management of your hallucinations.  Return to the emergency department immediately if any concerning signs or symptoms develop.

## 2018-11-30 ENCOUNTER — Emergency Department
Admission: EM | Admit: 2018-11-30 | Discharge: 2018-11-30 | Disposition: A | Discharge status: Home / Self Care | Payer: Medicaid Other | Attending: Emergency Medicine | Admitting: Emergency Medicine

## 2018-11-30 ENCOUNTER — Other Ambulatory Visit: Payer: Self-pay

## 2018-11-30 ENCOUNTER — Encounter: Payer: Self-pay | Admitting: Emergency Medicine

## 2018-11-30 ENCOUNTER — Emergency Department: Payer: Medicaid Other

## 2018-11-30 DIAGNOSIS — B9689 Other specified bacterial agents as the cause of diseases classified elsewhere: Secondary | ICD-10-CM | POA: Insufficient documentation

## 2018-11-30 DIAGNOSIS — Z79899 Other long term (current) drug therapy: Secondary | ICD-10-CM | POA: Insufficient documentation

## 2018-11-30 DIAGNOSIS — O99511 Diseases of the respiratory system complicating pregnancy, first trimester: Secondary | ICD-10-CM | POA: Insufficient documentation

## 2018-11-30 DIAGNOSIS — Z3491 Encounter for supervision of normal pregnancy, unspecified, first trimester: Secondary | ICD-10-CM

## 2018-11-30 DIAGNOSIS — O23591 Infection of other part of genital tract in pregnancy, first trimester: Secondary | ICD-10-CM | POA: Diagnosis not present

## 2018-11-30 DIAGNOSIS — O99331 Smoking (tobacco) complicating pregnancy, first trimester: Secondary | ICD-10-CM | POA: Insufficient documentation

## 2018-11-30 DIAGNOSIS — N760 Acute vaginitis: Secondary | ICD-10-CM

## 2018-11-30 DIAGNOSIS — Z113 Encounter for screening for infections with a predominantly sexual mode of transmission: Secondary | ICD-10-CM | POA: Insufficient documentation

## 2018-11-30 DIAGNOSIS — O209 Hemorrhage in early pregnancy, unspecified: Secondary | ICD-10-CM | POA: Diagnosis present

## 2018-11-30 DIAGNOSIS — F17210 Nicotine dependence, cigarettes, uncomplicated: Secondary | ICD-10-CM | POA: Diagnosis not present

## 2018-11-30 DIAGNOSIS — J45909 Unspecified asthma, uncomplicated: Secondary | ICD-10-CM | POA: Diagnosis not present

## 2018-11-30 DIAGNOSIS — R1030 Lower abdominal pain, unspecified: Secondary | ICD-10-CM

## 2018-11-30 DIAGNOSIS — Z3A01 Less than 8 weeks gestation of pregnancy: Secondary | ICD-10-CM | POA: Insufficient documentation

## 2018-11-30 DIAGNOSIS — O200 Threatened abortion: Secondary | ICD-10-CM | POA: Diagnosis not present

## 2018-11-30 LAB — WET PREP, GENITAL
Sperm: NONE SEEN
Trich, Wet Prep: NONE SEEN
Yeast Wet Prep HPF POC: NONE SEEN

## 2018-11-30 LAB — CBC
HCT: 36.4 % (ref 36.0–46.0)
Hemoglobin: 11.8 g/dL — ABNORMAL LOW (ref 12.0–15.0)
MCH: 30.4 pg (ref 26.0–34.0)
MCHC: 32.4 g/dL (ref 30.0–36.0)
MCV: 93.8 fL (ref 80.0–100.0)
Platelets: 305 10*3/uL (ref 150–400)
RBC: 3.88 MIL/uL (ref 3.87–5.11)
RDW: 12.4 % (ref 11.5–15.5)
WBC: 8.3 10*3/uL (ref 4.0–10.5)
nRBC: 0 % (ref 0.0–0.2)

## 2018-11-30 LAB — HCG, QUANTITATIVE, PREGNANCY: hCG, Beta Chain, Quant, S: 16862 m[IU]/mL — ABNORMAL HIGH (ref <5)

## 2018-11-30 LAB — URINALYSIS, COMPLETE (UACMP) WITH MICROSCOPIC
Bacteria, UA: NONE SEEN
Bilirubin Urine: NEGATIVE
Glucose, UA: NEGATIVE mg/dL
Hgb urine dipstick: NEGATIVE
Ketones, ur: NEGATIVE mg/dL
Leukocytes,Ua: NEGATIVE
Nitrite: NEGATIVE
Protein, ur: NEGATIVE mg/dL
Specific Gravity, Urine: 1.009 (ref 1.005–1.030)
pH: 5 (ref 5.0–8.0)

## 2018-11-30 LAB — BASIC METABOLIC PANEL
Anion gap: 8 (ref 5–15)
BUN: 11 mg/dL (ref 6–20)
CO2: 24 mmol/L (ref 22–32)
Calcium: 9.2 mg/dL (ref 8.9–10.3)
Chloride: 104 mmol/L (ref 98–111)
Creatinine, Ser: 0.69 mg/dL (ref 0.44–1.00)
GFR calc Af Amer: >60 mL/min (ref >60)
GFR calc non Af Amer: >60 mL/min (ref >60)
Glucose, Bld: 97 mg/dL (ref 70–99)
Potassium: 3.8 mmol/L (ref 3.5–5.1)
Sodium: 136 mmol/L (ref 135–145)

## 2018-11-30 LAB — ANTIBODY SCREEN: Antibody Screen: NEGATIVE

## 2018-11-30 LAB — ABO/RH: ABO/RH(D): B NEG

## 2018-11-30 MED ORDER — CLINDAMYCIN HCL 300 MG PO CAPS: 300.0000 mg | ORAL_CAPSULE | Freq: Two times a day (BID) | ORAL | 0 refills | Status: AC

## 2018-11-30 MED ORDER — ACETAMINOPHEN 500 MG PO TABS
ORAL_TABLET | ORAL | Status: AC
  Administered 2018-11-30: 1000 mg via ORAL
  Filled 2018-11-30: qty 2

## 2018-11-30 MED ORDER — ACETAMINOPHEN 500 MG PO TABS
1000.0000 mg | ORAL_TABLET | Freq: Once | ORAL | Status: AC
  Administered 2018-11-30: 18:00:00 1000 mg via ORAL

## 2018-11-30 MED ORDER — RHO D IMMUNE GLOBULIN 1500 UNIT/2ML IJ SOSY
300.0000 ug | PREFILLED_SYRINGE | Freq: Once | INTRAMUSCULAR | Status: AC
  Administered 2018-11-30: 300 ug via INTRAMUSCULAR
  Filled 2018-11-30: qty 2

## 2018-11-30 MED ORDER — CLINDAMYCIN HCL 300 MG PO CAPS: 300.0000 mg | ORAL_CAPSULE | Freq: Two times a day (BID) | ORAL | 0 refills | Status: DC

## 2018-11-30 NOTE — ED Notes (Signed)
Pt alert and oriented X 4, stable for discharge. RR even and unlabored, color WNL. Discussed discharge instructions and follow up when appropriate. Instructed to follow up with ER for any life threatening symptoms or concerns that patient or family of patient may have  

## 2018-11-30 NOTE — ED Provider Notes (Signed)
[facility] Emergency Department Provider Note  ____________________________________________   First MD Initiated Contact with Patient 11/30/18 1426     (approximate)  I have reviewed the triage vital signs and the nursing notes.   HISTORY  Chief Complaint Possible Pregnancy, Abdominal Cramping, and Vaginal Bleeding    HPI Carmen Daniels is a 33 y.o. female here with reported vaginal bleeding.  The patient states that over the last week, she has felt like she has had fevers.  She has had temperature up to 101.  She states she is had some mild nasal congestion and was recently seen and treated for possible sinus infection.  She attributes her symptoms to this.  However, she also began to have some lower abdominal pain and cramping.  This is intermittent and random.  It does not seem to be associated with anything.  She states it started approximately 2 days ago, and she has had intermittent episodes in which she has had bright red blood passed from her vagina.  She took a home pregnancy test which was read as positive.  She does not use birth control but is not currently interested in getting pregnant.  She states that she has had some moderate lower abdominal cramping associated with the bleeding.        Past Medical History:  Diagnosis Date   Allergic rhinitis    Anxiety    Asthma    Back pain    Bipolar disorder (HCC)    Chronic pain    Migraines    Nasal polyps    Nausea    Neck pain    OCD (obsessive compulsive disorder)     Patient Active Problem List   Diagnosis Date Noted   QT prolongation 07/03/2018   AMS (altered mental status) 07/03/2018   Serum total bilirubin elevated 07/03/2018   Acute rhinosinusitis 07/03/2018   Syncope 07/02/2018   Sinus congestion 04/13/2017   Borderline personality disorder ([place]) 04/11/2017   Bipolar I disorder, current or most recent episode manic, with psychotic features ([place]) 04/10/2017     Bipolar disorder ([place]) 04/10/2017   Abnormal MRI of head 04/21/2016   Chronic migraine without aura 03/27/2015   Mild persistent asthma 12/30/2014   Allergic rhinitis due to pollen 12/30/2014   Rhinitis medicamentosa 12/30/2014   Nasal polyposis 12/30/2014    Past Surgical History:  Procedure Laterality Date   NASAL SINUS SURGERY     WISDOM TOOTH EXTRACTION      Prior to Admission medications   Medication Sig Start Date End Date Taking? Authorizing Provider  AJOVY 225 MG/1.5ML SOSY INJECT 225 MG INTO THE SKIN EVERY 30 (THIRTY) DAYS. Patient taking differently: Inject 225 mg into the skin every 30 (thirty) days.  10/23/18   Marcial Pacas, MD  albuterol (VENTOLIN HFA) 108 (90 Base) MCG/ACT inhaler Inhale 1-2 puffs into the lungs every 4 (four) hours as needed for wheezing or shortness of breath. 04/15/17   Money, Lowry Ram, FNP  ARIPiprazole (ABILIFY) 5 MG tablet Take 4 tablets (20 mg total) by mouth at bedtime. For mood control Patient not taking: Reported on 11/08/2018 07/04/18   Geradine Girt, DO  beclomethasone (QVAR) 40 MCG/ACT inhaler Inhale 1 puff into the lungs 2 (two) times daily as needed (asthma).     [provider]  BOTOX 100 units SOLR injection MD TO INJECT 155 UNITS INTRAMUSCULARLY INTO HEAD AND NECK MUSCLES EVERY 3 MONTHS BY PROVIDER Patient taking differently: Inject 155 Units into the muscle  every 3 (three) months. Head and neck muscles for migraine prevention 04/21/18   Marcial Pacas, MD  clindamycin (CLEOCIN) 300 MG capsule Take 1 capsule (300 mg total) by mouth 2 (two) times daily for 7 days. 11/30/18 12/07/18  Duffy Bruce, MD  clonazePAM (KLONOPIN) 1 MG tablet Take 1 mg by mouth 4 (four) times daily.    [provider]  HYDROcodone-acetaminophen (NORCO) 10-325 MG tablet Take 1 tablet by mouth 2 (two) times daily as needed for moderate pain.    [provider]  hydrOXYzine (ATARAX/VISTARIL) 50 MG tablet Take 1 tablet (50 mg total) by  mouth 2 (two) times daily as needed for anxiety. Patient not taking: Reported on 11/08/2018 07/04/18   Geradine Girt, DO  lithium carbonate (ESKALITH) 450 MG CR tablet Take 900 mg by mouth at bedtime.    [provider]  nystatin (MYCOSTATIN) 100000 UNIT/ML suspension Take 5 mLs (500,000 Units total) by mouth 4 (four) times daily. Patient not taking: Reported on 11/08/2018 07/04/18   Geradine Girt, DO  SUMAtriptan (IMITREX) 100 MG tablet TAKE 1 TABLET AT ONSET MAY REPEAT IN 2 HOURS IF NEEDED MAY DOSE 2 PER DAY OR 3 Patient taking differently: Take 100 mg by mouth every 2 (two) hours as needed for migraine. MAY DOSE 2 PER DAY OR 3 10/23/18   Marcial Pacas, MD  zolpidem (AMBIEN) 10 MG tablet Take 10 mg by mouth at bedtime as needed for sleep.     [provider]    Allergies Keflex [cephalexin] and Flagyl [metronidazole]  Family History  Problem Relation Age of Onset   Healthy Father    Healthy Mother    Arthritis Maternal Grandmother    Arthritis Maternal Grandfather    Depression Paternal Grandmother     Social History Social History   Tobacco Use   Smoking status: Current Every Day Smoker    Packs/day: 0.50    Types: Cigarettes   Smokeless tobacco: Never Used  Substance Use Topics   Alcohol use: Yes    Alcohol/week: 0.0 standard drinks    Comment: Occasional alcohol use   Drug use: No    Review of Systems  Review of Systems  Constitutional: Positive for fatigue. Negative for fever.  HENT: Negative for congestion and sore throat.   Eyes: Negative for visual disturbance.  Respiratory: Negative for cough and shortness of breath.   Cardiovascular: Negative for chest pain.  Gastrointestinal: Positive for nausea. Negative for abdominal pain, diarrhea and vomiting.  Genitourinary: Positive for pelvic pain and vaginal bleeding. Negative for flank pain.  Musculoskeletal: Negative for back pain and neck pain.  Skin: Negative for rash and wound.    Neurological: Negative for weakness.  All other systems reviewed and are negative.    ____________________________________________  PHYSICAL EXAM:      VITAL SIGNS: ED Triage Vitals  Enc Vitals Group     BP 11/30/18 1242 (!) 125/57     Pulse Rate 11/30/18 1242 86     Resp 11/30/18 1242 20     Temp 11/30/18 1242 99.1 F (37.3 C)     Temp Source 11/30/18 1242 Oral     SpO2 11/30/18 1242 100 %     Weight 11/30/18 1243 170 lb (77.1 kg)     Height 11/30/18 1243 5\' 11"  (1.803 m)     Head Circumference --      Peak Flow --      Pain Score 11/30/18 1242 6     Pain Loc --  Pain Edu? --      Excl. in [place]? --      Physical Exam Vitals signs and nursing note reviewed.  Constitutional:      General: She is not in acute distress.    Appearance: She is well-developed.  HENT:     Head: Normocephalic and atraumatic.     Nose: Congestion present.     Mouth/Throat:     Pharynx: Posterior oropharyngeal erythema present.  Eyes:     Conjunctiva/sclera: Conjunctivae normal.  Neck:     Musculoskeletal: Neck supple.  Cardiovascular:     Rate and Rhythm: Normal rate and regular rhythm.     Heart sounds: Normal heart sounds. No murmur. No friction rub.  Pulmonary:     Effort: Pulmonary effort is normal. No respiratory distress.     Breath sounds: Normal breath sounds. No wheezing or rales.  Abdominal:     General: There is no distension.     Palpations: Abdomen is soft.     Tenderness: There is abdominal tenderness (minimal).  Genitourinary:    Comments: Moderate white vaginal discharge, os is closed. No CMT. No blood noted in vaginal vault. Uterus non-tender. No adnexal fullness or tenderness. Skin:    General: Skin is warm.     Capillary Refill: Capillary refill takes less than 2 seconds.  Neurological:     Mental Status: She is alert and oriented to person, place, and time.     Motor: No abnormal muscle tone.       ____________________________________________    LABS (all labs ordered are listed, but only abnormal results are displayed)  Labs Reviewed  WET PREP, GENITAL - Abnormal; Notable for the following components:      Result Value   Clue Cells Wet Prep HPF POC PRESENT (*)    WBC, Wet Prep HPF POC MODERATE (*)    All other components within normal limits  CBC - Abnormal; Notable for the following components:   Hemoglobin 11.8 (*)    All other components within normal limits  HCG, QUANTITATIVE, PREGNANCY - Abnormal; Notable for the following components:   hCG, Beta Chain, Quant, S 16,862 (*)    All other components within normal limits  URINALYSIS, COMPLETE (UACMP) WITH MICROSCOPIC - Abnormal; Notable for the following components:   Color, Urine YELLOW (*)    APPearance CLEAR (*)    All other components within normal limits  GC/CHLAMYDIA PROBE AMP  BASIC METABOLIC PANEL  ABO/RH  ANTIBODY SCREEN  RHOGAM INJECTION    ____________________________________________  EKG: None ________________________________________  RADIOLOGY All imaging, including plain films, CT scans, and ultrasounds, independently reviewed by me, and interpretations confirmed via formal radiology reads.  ED MD interpretation:   U/S: Intrauterine gestational sac with yolk sac, approximately 6 weeks, no signs of complication, no free fluid  Official radiology report(s): US Ob Less Than 14 Weeks With Ob Transvaginal  Result Date: 11/30/2018 CLINICAL DATA:  Pregnant with vaginal bleeding and pelvic cramping. EXAM: OBSTETRIC <14 WK [place] AND TRANSVAGINAL OB US TECHNIQUE: Both transabdominal and transvaginal ultrasound examinations were performed for complete evaluation of the gestation as well as the maternal uterus, adnexal regions, and pelvic cul-de-sac. Transvaginal technique was performed to assess early pregnancy. COMPARISON:  None. FINDINGS: Intrauterine gestational sac: Single Yolk sac:  Present Embryo:  Present Cardiac Activity: None Heart Rate: N/A MSD: 12.4 mm    6 w   0 d Possible small embryonic pole estimated at 1.7 mm. This is too small today. Subchorionic hemorrhage:  None visualized. Maternal uterus/adnexae: Both ovaries appear normal. There is a 3.2 cm para ovarian cyst on the left. IMPRESSION: Intrauterine gestational sac with yolk sac and possible small embryonic pole. Mean sac diameter suggest 6 weeks and 0 days gestation. No fetal heart tones. Too early to assess for viability. Recommend follow-up ultrasound in 10-14 days. Electronically Signed   By: Marijo Sanes M.D.   On: 11/30/2018 16:17    ____________________________________________  PROCEDURES   Procedure(s) performed (including Critical Care):  Procedures  ____________________________________________  INITIAL IMPRESSION / MDM / [place] / ED COURSE  As part of my medical decision making, I reviewed the following data within the [place] was evaluated in Emergency Department on 11/30/2018 for the symptoms described in the history of present illness. She was evaluated in the context of the global COVID-19 pandemic, which necessitated consideration that the patient might be at risk for infection with the SARS-CoV-2 virus that causes COVID-19. Institutional protocols and algorithms that pertain to the evaluation of patients at risk for COVID-19 are in a state of rapid change based on information released by regulatory bodies including the CDC and federal and state organizations. These policies and algorithms were followed during the patient's care in the ED.  Some ED evaluations and interventions may be delayed as a result of limited staffing during the pandemic.*      Medical Decision Making: 33 year old female here with reported vaginal bleeding in the setting of very early first trimester pregnancy.  Patient also reported subjective fevers.  On exam, patient is very well-appearing.  Regarding her bleeding, no significant bleeding  noted on exam.  Ultrasound confirms intrauterine pregnancy.  Hemoglobin is stable.  She is Rh-, so will give RhoGam.  Discussed possible threatened miscarriage with her, and she will follow-up with an OB.  Incidental BV noted and pt has h/o same, with GI upset from flagyl - clinda given. She does report some mild vaginal discharge/irritation. She states that she plans to have medical abortion.  Regarding her reported fevers, I suspect likely URI.  She was just tested for COVID.  She has no evidence to suggest septic abortion.  She has no fever, no tachycardia, no hypotension, or signs of sepsis.  No leukocytosis.  Urinalysis is without UTI.  Her exam is not consistent with PID.  Lung sounds are clear with no evidence of pneumonia.  Supportive care encouraged.  ____________________________________________  FINAL CLINICAL IMPRESSION(S) / ED DIAGNOSES  Final diagnoses:  BV (bacterial vaginosis)  Threatened miscarriage  First trimester pregnancy     MEDICATIONS GIVEN DURING THIS VISIT:  Medications  rho (d) immune globulin (RHIG/RHOPHYLAC) injection 300 mcg (has no administration in time range)     ED Discharge Orders         Ordered    clindamycin (CLEOCIN) 300 MG capsule  2 times daily     11/30/18 1634           Note:  This document was prepared using Dragon voice recognition software and may include unintentional dictation errors.   Duffy Bruce, MD 11/30/18 [phone]

## 2018-11-30 NOTE — ED Notes (Signed)
Mild vaginal bleeding over last few days. No NVD. States vaginal bleeding has stopped at this time.

## 2018-11-30 NOTE — ED Notes (Signed)
Patient transported to Ultrasound 

## 2018-11-30 NOTE — Discharge Instructions (Addendum)
As we discussed, your work-up today showed a very early, 6-week pregnancy.  There were no signs of complications.  Your bleeding could be due to normal pregnancy, but could also be an early miscarriage.  It is important that you follow-up in 48 hours for repeat checkup.  Call [facility].  If this is not a desired pregnancy, the earlier you seek treatment, the safer your treatment options will be.  You also had bacterial vaginosis, for which I have prescribed clindamycin.  I recommend taking a prenatal vitamin.

## 2018-11-30 NOTE — ED Notes (Signed)
Lab states that will call when Rhogam is ready for pickup.

## 2018-11-30 NOTE — ED Triage Notes (Signed)
Pt reports she took 2 pregnancy test at home 2 days ago and they were positive and she took another yesterday and it was positive. Pt reports after she started with vaginal bleeding and abdominal cramping. Pt reports the bleeding started as brownish discharge and then it was bright red drips and now she is not bleeding. Pt reports her abd cramps intermittently get worse.

## 2018-11-30 NOTE — ED Notes (Signed)
Called lab to add on sent urine.

## 2018-11-30 NOTE — ED Notes (Signed)
ED Provider at bedside. 

## 2018-12-01 LAB — RHOGAM INJECTION: Unit division: 0

## 2018-12-28 ENCOUNTER — Ambulatory Visit: Payer: Medicaid Other | Admitting: Obstetrics and Gynecology

## 2018-12-28 ENCOUNTER — Encounter: Payer: Self-pay | Admitting: Obstetrics and Gynecology

## 2018-12-28 ENCOUNTER — Other Ambulatory Visit: Payer: Self-pay

## 2018-12-28 DIAGNOSIS — Z3009 Encounter for other general counseling and advice on contraception: Secondary | ICD-10-CM | POA: Insufficient documentation

## 2018-12-28 DIAGNOSIS — Z9889 Other specified postprocedural states: Secondary | ICD-10-CM | POA: Insufficient documentation

## 2018-12-28 NOTE — Patient Instructions (Signed)
Postpartum Tubal Ligation, Care After This sheet gives you information about how to care for yourself after your procedure. Your health care provider may also give you more specific instructions. If you have problems or questions, contact your health care provider. What can I expect after the procedure? After the procedure, you may have:  A sore throat.  Bruising or pain in your back.  Nausea or vomiting.  Dizziness.  Mild abdominal discomfort or pain, such as cramping, gas pain, or feeling bloated.  Soreness around the incision area.  Tiredness.  Pain in your shoulders. Follow these instructions at home: Medicines  Ask your health care provider if the medicine prescribed to you: ? Requires you to avoid driving or using heavy machinery. ? Can cause constipation. You may need to take actions to prevent or treat constipation, such as:  Drink enough fluid to keep your urine pale yellow.  Take over-the-counter or prescription medicines.  Eat foods that are high in fiber, such as beans, whole grains, and fresh fruits and vegetables.  Limit foods that are high in fat and processed sugars, such as fried or sweet foods.  Do not take aspirin because it can cause bleeding. Activity  Rest for the remainder of the day.  Return to your normal activities as told by your health care provider. Ask your health care provider what activities are safe for you.  Do not have sex, douche, or put a tampon or anything else in your vagina for 6 weeks or as long as told by your health care provider.  Do not lift anything that is heavier than your baby for 2 weeks, or the limit that you are told, until your health care provider says that it is safe. Incision care      Follow instructions from your health care provider about how to take care of your incision. Make sure you: ? Wash your hands with soap and water before and after you change your bandage (dressing). If soap and water are not  available, use hand sanitizer. ? Change your dressing as told by your health care provider. ? Leave stitches (sutures), skin glue, or adhesive strips in place. These skin closures may need to stay in place for 2 weeks or longer. If adhesive strip edges start to loosen and curl up, you may trim the loose edges. Do not remove adhesive strips completely unless your health care provider tells you to do that.  Check your incision area every day for signs of infection. Check for: ? Redness, swelling, or pain. ? Fluid or blood. ? Warmth. ? Pus or a bad smell. Other Instructions  Do not take baths, swim, or use a hot tub until your health care provider approves. Ask your health care provider if you may take showers. You may only be allowed to take sponge baths.  Keep all follow-up visits as told by your health care provider. This is important. Contact a health care provider if:  You have redness, swelling, or pain around your incision.  Your incision feels warm to the touch.  Your pain does not improve after 2-3 days.  You have a rash.  You repeatedly become dizzy or lightheaded.  Your pain medicine is not helping.  You are constipated. Get help right away if you:  Have a fever.  Faint.  Have pain in your abdomen that gets worse.  Have fluid or blood coming from your incision.  You have pus or a bad smell coming from your incision.  The  edges of your incision break open after the sutures have been removed.  Have shortness of breath or trouble breathing.  Have chest pain or leg pain.  Have ongoing nausea or diarrhea. Summary  Mild abdominal discomfort is common after this procedure.  Contact your health care provider if you experience problems or have concerns.  Do not lift anything that is heavier than your baby for 2 weeks, or the limit that you are told, until your health care provider says that it is safe.  Keep all follow-up visits as told by your health care  provider. This is important. This information is not intended to replace advice given to you by your health care provider. Make sure you discuss any questions you have with your health care provider. Document Released: 08/10/2011 Document Revised: 12/29/2017 Document Reviewed: 12/29/2017 Elsevier Patient Education  2020 [employer].

## 2018-12-29 ENCOUNTER — Telehealth: Payer: Self-pay

## 2018-12-29 LAB — BETA HCG QUANT (REF LAB): hCG Quant: 36 m[IU]/mL

## 2018-12-29 NOTE — Telephone Encounter (Signed)
Attemoted to contact pt to notify her that she needs to come back to the office to refill out a tubal form.  Pt vm was full. -EH/RMA

## 2019-01-01 ENCOUNTER — Telehealth: Payer: Self-pay

## 2019-01-01 NOTE — Progress Notes (Signed)
Carmen Daniels presents with her mother with several complaints.  1) She is s/p a recently medically induced AB at [facility] on 12/07/18.' She continues to have some bleeding not not heavy. She desire tubal ligation due to not desire to have children and history of mental disorders requiring several meds. She can  Not take OCP's d/t H/O migraine HA with aura.  2) She also reports heavy cycles prior to pregnancy. Has cramps and clots. Cycles last 7 days. She is interested in endometrial ablation.  3) She also reports a h/o ovarian cyst and noted again on recent OB U/S.  Sexual active without contraception. Last pap earlier this yr. Reports as normal.  Medical problems and medications as listed in chart.'  PE  AF VSS Lungs clear Heart RRR Abd soft + BS GU deferred  A/P  Recent EAB         Heavy cycles         Ovarian cyst  Reviewed EAB with pt and mother. Will check BHCG today. Discussed need to follow to 0. Will have pt sign BTL papers today. Pt and mother aware of 30 day wait before tubal can be completed. Discussed Tx options for heavy cycles besides endometrial ablation. Pt is interested in IUD and +/- Lysteda. Will plan to repeat U/S in 6-8 weeks for follow up of ovarian cyst. F/U as per test results.

## 2019-01-01 NOTE — Telephone Encounter (Signed)
TC to pt regarding results pt states she has been made aware already  Pt aware of need for repeat HCG.

## 2019-01-08 ENCOUNTER — Encounter: Payer: Self-pay | Admitting: Neurology

## 2019-01-08 ENCOUNTER — Ambulatory Visit: Payer: Self-pay | Admitting: Neurology

## 2019-01-08 ENCOUNTER — Telehealth: Payer: Self-pay | Admitting: *Deleted

## 2019-01-08 NOTE — Telephone Encounter (Signed)
Cancelled appt same day due to sickness.

## 2019-01-09 ENCOUNTER — Ambulatory Visit: Payer: Medicaid Other | Admitting: Nurse Practitioner

## 2019-01-30 ENCOUNTER — Encounter: Payer: Self-pay | Admitting: Neurology

## 2019-01-30 ENCOUNTER — Other Ambulatory Visit: Payer: Self-pay

## 2019-01-30 ENCOUNTER — Ambulatory Visit: Payer: Medicaid Other | Admitting: Neurology

## 2019-01-30 VITALS — BP 136/82 | HR 78 | Temp 98.3°F | Ht 71.0 in | Wt 188.5 lb

## 2019-01-30 DIAGNOSIS — G43719 Chronic migraine without aura, intractable, without status migrainosus: Secondary | ICD-10-CM | POA: Diagnosis not present

## 2019-01-30 NOTE — Progress Notes (Signed)
**  Botox 100 units x 2 vials, NDC [id number], Lot [id number], Exp 09/2021, specialty pharmacy.//mck,rn**

## 2019-01-31 NOTE — Progress Notes (Signed)
Chief Complaint  Patient presents with  . Migraine    Botox 100 units x 2 vials - specialty pharmacy.      PATIENT: Carmen Daniels DOB: 01/18/1986  Chief Complaint  Patient presents with  . Migraine    Botox 100 units x 2 vials - specialty pharmacy.     HISTORICAL  Carmen Daniels is a 33 years old right-handed female, accompanied by her mother,, seen in refer by her pain management doctor Dr. Nelva Bush for evaluation of migraines  I have reviewed most recent office note in April 2016, she has past medical history of bipolar disorder, complains of frequent headaches, diffuse body achy pain, UDS was negative,   She complains of migraine headaches since 2011, her typical migraine started with tunnel vision, lasting for 10-15 minutes, followed by severe pounding headache with associated light noise sensitivity, sumatriptan 100 mg as needed has been very helpful, but sometimes she require repeat dosage. She is now having 4-5 typical prolonged migraines each week, lasting for 1 day,  Trigger for her migraines are stress, sleep deprivation,  She has tried over-the-counter aspirin, Tylenol, NSAIDs Excedrin Migraine, without helping her headaches, she has seen different neurologists in the past, has tried different preventive medications, including beta blocker, calcium channel blocker without helping, currently she is taking Topamax 25 mg 7 to 8 tablets every night, which is adjusted by her psychologist Dr. 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 No white matter disease or large vessel/lacunar infarction.  UPDATE Mar 27 2015: She is now taking Topamax 25 mg tablets 8 tablets every night, still has frequent migraine headaches, also consider Botox injection as  migraine prevention, she has variable frequency of migraine, few weeks ago, she had 3 weeks of daily headaches, Imitrex as needed is helpful in 60 minutes, she still has dull achy pain, but no longer has the intense headaches.  used up all 9 tablets of allowed Imitrex each month. She usually has visual aura proceeding each migraine headaches,  She is on polypharmacy for her mood disorder, currently taking Topamax, latuda, seroquel, Xanax, Abilify  UPDATE September 04 2015: She had her first Botox injection as migraine prevention in May 05 2015, she had frequent headache postinjection for 2 weeks, eventually she noticed a benefit, she has much less, less severe migraine, but even with the injection she has 2-3 migraines each week, noticed wearing off at the end of 3 months.  UPDATE Nov 28th 2017: She is having frequent headaches, couple times a week, reported 50% improvement with Botox injection, has much less frequent and less severe headaches. 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   UPDATE Jul 21 2016: She responded well to previous injection on April 21 2016, she did not notice significant side  effect, she reported less headache, headache has also become less intense,  She also tried Toradol IM injection for one prolonged headaches, which helps well for her, she used to her monthly supply of Imitrex tablets,  UPDATE October 05 2016: I reviewed her [place] controlled substance registry, in July 2018, she got tramadol 60 tablets, hydrocodone 10/Tylenol 60 tablets, Xanax 1 mg 120 tablets, tramadol again 60 tablets,   In June 2018, hydrocodone/Tylenol 10/325 mg 60 tablets, Xanax 1 mg 120 tablets, tramadol 30 tablet hydrocodone syrup 112, tramadol 30 tablets  I also reviewed her ED presentation of August 12th 2018, she slipped and fell forward face down, mild transient loss of consciousness, she had persistent headache, neck pain,nausea vomiting, blurry vision since the injury,  She also hurt her left great toe, with a black and  bruise, complains of pain,  I personally reviewed CT head without contrast August twelfth 2018, which showed no significant abnormality, CT cervical and facial/sinus showed no evidence of fracture  UPDATE Sept 5 2018: She went well with previous Botox injection, did has frequent headaches during her concussion October 03 2016.  UPDATE Feb 10 2017: She responded very well to Botox injection as migraine prevention  UPDATE Jun 29 2017: She was admitted to the hospital for depression suicidal attempt, now with increased migraine headaches,  UPDATE October 12 2017: She is now back home with her parents, used up all her allowed imitrex each month.  Insurance company also approved Ajovy for her  UPDATE Jan 11 2018: She is doing well with Ajovy and BOTOX, has migraine once a week, well controlled by imitrex  UPDATE Apr 12 2018: She responded well to the combination therapy of Ajovy and Botox as migraine prevention,  UPDATE June 8th 2020: She responded very well to previous injection, there was no significant side effect noted  UPDATE Jan 31 2019: She is tearful during today's visit. She has lost her dog  REVIEW OF SYSTEMS: Full 14 system review of systems performed and notable only for as above  ALLERGIES: Allergies  Allergen Reactions  . Keflex [Cephalexin] Anaphylaxis  . Flagyl [Metronidazole] Diarrhea     HOME MEDICATIONS: Current Outpatient Prescriptions  Medication Sig Dispense Refill  . ALPRAZolam (XANAX) 1 MG tablet Take 1 mg by mouth 4 (four) times daily.  2  . ARIPiprazole (ABILIFY) 30 MG tablet Take 30 mg by mouth daily.  3  . Eszopiclone 3 MG TABS Take 3 mg by mouth at bedtime.  3  . HYDROcodone-acetaminophen (NORCO) 10-325 MG per tablet Take 1 tablet by mouth 2 (two) times daily as needed. for pain  0  . LATUDA 40 MG TABS tablet TAKE 1 TABLET EVERY EVENING WITH FOOD  1  . promethazine (PHENERGAN) 25 MG tablet Take 25 mg by mouth 3 (three) times daily as needed.  0  . QUEtiapine (SEROQUEL) 25 MG tablet Take 25 mg by mouth at bedtime.  2  . SUMAtriptan (IMITREX) 100 MG tablet TAKE 1 TABLET BY MOUTH. MAY TAKE REPEAT WITH 1 TABLET IN TWO HOURS IF NEEDED  5  . topiramate (TOPAMAX) 25 MG tablet TAKE 7-8 TABLETS AT BEDTIME  0     PAST MEDICAL HISTORY: Past Medical History:  Diagnosis Date  . Allergic rhinitis   . Anxiety   . Asthma   . Back pain   . Bipolar disorder ([place])   . Chronic pain   . Migraines   . Nasal polyps   . Nausea   .  Neck pain   . OCD (obsessive compulsive disorder)   . Ovarian cyst    left    PAST SURGICAL HISTORY: Past Surgical History:  Procedure Laterality Date  . NASAL SINUS SURGERY    . WISDOM TOOTH EXTRACTION      FAMILY HISTORY: Family History  Problem Relation Age of Onset  . Healthy Father   . Healthy Mother   . Arthritis Maternal Grandmother   . Arthritis Maternal Grandfather   . Depression Paternal Grandmother     SOCIAL HISTORY:  Social History   Socioeconomic History  . Marital status: Married    Spouse name: Not on file  . Number of children: 0  . Years of education: 62  . Highest education level: Not on file  Occupational History  . Occupation: Unemployed  Social Needs  . Financial resource strain: Not on file  . Food insecurity    Worry: Not on file    Inability: Not on file  . Transportation needs     Medical: Not on file    Non-medical: Not on file  Tobacco Use  . Smoking status: Current Every Day Smoker    Packs/day: 0.50    Types: Cigarettes  . Smokeless tobacco: Never Used  Substance and Sexual Activity  . Alcohol use: Yes    Alcohol/week: 0.0 standard drinks    Comment: Occasional alcohol use  . Drug use: No  . Sexual activity: Yes    Birth control/protection: Condom  Lifestyle  . Physical activity    Days per week: Not on file    Minutes per session: Not on file  . Stress: Not on file  Relationships  . Social Herbalist on phone: Not on file    Gets together: Not on file    Attends religious service: Not on file    Active member of club or organization: Not on file    Attends meetings of clubs or organizations: Not on file    Relationship status: Not on file  . Intimate partner violence    Fear of current or ex partner: Not on file    Emotionally abused: Not on file    Physically abused: Not on file    Forced sexual activity: Not on file  Other Topics Concern  . Not on file  Social History Narrative   Lives at home alone.   Right-handed.   3 glasses of green tea per day.    PHYSICAL EXAM   Vitals:   01/30/19 1548  BP: 136/82  Pulse: 78  Temp: 98.3 F (36.8 C)  Weight: 188 lb 8 oz (85.5 kg)  Height: 5\' 11"  (1.803 m)    Not recorded      Body mass index is 26.29 kg/m.  PHYSICAL EXAMNIATION: External tinnitus radiating to the NEUROLOGICAL EXAM:  MMSE - Mini Mental State Exam 10/05/2016  Orientation to time 4  Orientation to Place 5  Registration 3  Attention/ Calculation 5  Recall 1  Language- name 2 objects 2  Language- repeat 1  Language- follow 3 step command 3  Language- read & follow direction 1  Write a sentence 1  Copy design 0  Total score 26  animal naming 6.   CRANIAL NERVES: CN II: Visual fields are full to confrontation. During the conversation continue atenolol pupils are round equal and briskly reactive to  light. CN III, IV, VI: extraocular movement are normal. No ptosis. CN V: Facial sensation is intact to pinprick in all  3 divisions bilaterally. Corneal responses are intact.  CN VII: Face is symmetric with normal eye closure and smile. CN VIII: Hearing is normal to rubbing fingers CN IX, X: Palate elevates symmetrically. Phonation is normal. CN XI: Head turning and shoulder shrug are intact CN XII: Tongue is midline with normal movements and no atrophy.  MOTOR: Moves 4 extremities without difficulty  COORDINATION: There is no dysmetria noted  GAIT/STANCE: Normal study    DIAGNOSTIC DATA (LABS, IMAGING, TESTING) - I reviewed patient records, labs, notes, testing and imaging myself where available.   ASSESSMENT AND PLAN  Carmen Daniels is a 33 y.o. female    Chronic migraine headaches with aura, s/p concussion on October 03 2016.  Patient has been treated with Botox injection as migraine prevention for extended period of time, which definitely has helped her migraine headaches, without Botox injection, she would have prolonged, very frequent migraines, difficult to control with home medications,         Exta 45 units: 10 units into eac masseters muscles, Extra 25 units were injected into bilateral cervical paraspinals.  Her migraine headache overall has much improved with combination of Ajovy, and Botox injection   Marcial Pacas, M.D. Ph.D.  [facility] [address], [place], [place] [address] Ph: [phone] Fax: [phone]  CC: To Dr. [name], Dr. Nelva Bush, Delfino Lovett

## 2019-02-08 DIAGNOSIS — G43719 Chronic migraine without aura, intractable, without status migrainosus: Secondary | ICD-10-CM | POA: Insufficient documentation

## 2019-02-13 ENCOUNTER — Other Ambulatory Visit: Payer: Self-pay

## 2019-02-13 ENCOUNTER — Encounter: Payer: Self-pay | Admitting: Advanced Practice Midwife

## 2019-02-13 ENCOUNTER — Ambulatory Visit: Payer: Medicaid Other | Admitting: Advanced Practice Midwife

## 2019-02-13 VITALS — BP 140/88 | HR 111 | Wt 181.0 lb

## 2019-02-13 DIAGNOSIS — Z30430 Encounter for insertion of intrauterine contraceptive device: Secondary | ICD-10-CM

## 2019-02-13 DIAGNOSIS — Z3009 Encounter for other general counseling and advice on contraception: Secondary | ICD-10-CM | POA: Diagnosis not present

## 2019-02-13 DIAGNOSIS — Z3202 Encounter for pregnancy test, result negative: Secondary | ICD-10-CM

## 2019-02-13 LAB — POCT URINE PREGNANCY: Preg Test, Ur: NEGATIVE

## 2019-02-13 NOTE — Patient Instructions (Signed)

## 2019-02-13 NOTE — Progress Notes (Signed)
   GYNECOLOGY PROGRESS NOTE  History:  33 y.o. presents to [place] office today for contraceptive visit. She reports unwanted fertility and heavy menstrual periods. She desires BTL but discussed options with Dr Rip Harbour at previous visit and will try IUD first.  She denies h/a, dizziness, shortness of breath, n/v, or fever/chills.    The following portions of the patient's history were reviewed and updated as appropriate: allergies, current medications, past family history, past medical history, past social history, past surgical history and problem list.   Review of Systems:  Pertinent items are noted in HPI.   Objective:  Physical Exam Blood pressure 140/88, pulse (!) 111, weight 181 lb (82.1 kg). VS reviewed, nursing note reviewed,  Constitutional: well developed, well nourished, no distress HEENT: normocephalic CV: normal rate Pulm/chest wall: normal effort Breast Exam: deferred Abdomen: soft Neuro: alert and oriented x 3 Skin: warm, dry Psych: affect normal Pelvic exam: Cervix pink, visually closed, without lesion, scant white creamy discharge, vaginal walls and external genitalia normal  IUD Procedure Note Patient identified, informed consent performed.  Discussed risks of irregular bleeding, cramping, infection, malpositioning or misplacement of the IUD outside the uterus which may require further procedures. Time out was performed.  Urine pregnancy test negative.  Speculum placed in the vagina.  Cervix visualized.  Cleaned with Betadine x 2.  Grasped anteriorly with a single tooth tenaculum.  Uterus sounded to 8 cm.  Mirena IUD placed per manufacturer's recommendations.  Strings trimmed to 3 cm. Tenaculum was removed, good hemostasis noted.  Patient tolerated procedure well.   Patient was given post-procedure instructions and the Mirena care card with expiration date.  Patient was also asked to check IUD strings periodically and follow up in 4-6 weeks for IUD  check.  Assessment & Plan:  1. Encounter for IUD insertion --IUD inserted without difficulty.  Pt tolerated well.  See procedure note. - POCT urine pregnancy. UPT negative.  Fatima Blank, CNM 3:43 PM

## 2019-02-18 ENCOUNTER — Emergency Department (HOSPITAL_COMMUNITY): Payer: Medicaid Other

## 2019-02-18 ENCOUNTER — Other Ambulatory Visit: Payer: Self-pay

## 2019-02-18 ENCOUNTER — Emergency Department (HOSPITAL_COMMUNITY)
Admission: EM | Admit: 2019-02-18 | Discharge: 2019-02-18 | Disposition: A | Discharge status: Home / Self Care | Payer: Medicaid Other | Attending: Emergency Medicine | Admitting: Emergency Medicine

## 2019-02-18 ENCOUNTER — Encounter (HOSPITAL_COMMUNITY): Payer: Self-pay | Admitting: Emergency Medicine

## 2019-02-18 DIAGNOSIS — R102 Pelvic and perineal pain: Secondary | ICD-10-CM | POA: Insufficient documentation

## 2019-02-18 DIAGNOSIS — F17210 Nicotine dependence, cigarettes, uncomplicated: Secondary | ICD-10-CM | POA: Insufficient documentation

## 2019-02-18 DIAGNOSIS — N939 Abnormal uterine and vaginal bleeding, unspecified: Secondary | ICD-10-CM | POA: Diagnosis present

## 2019-02-18 DIAGNOSIS — Z79899 Other long term (current) drug therapy: Secondary | ICD-10-CM | POA: Diagnosis not present

## 2019-02-18 DIAGNOSIS — Z30432 Encounter for removal of intrauterine contraceptive device: Secondary | ICD-10-CM | POA: Insufficient documentation

## 2019-02-18 DIAGNOSIS — J45909 Unspecified asthma, uncomplicated: Secondary | ICD-10-CM | POA: Insufficient documentation

## 2019-02-18 MED ORDER — KETOROLAC TROMETHAMINE 60 MG/2ML IM SOLN: 30.0000 mg | Freq: Once | INTRAMUSCULAR | Status: DC

## 2019-02-18 MED ORDER — MORPHINE SULFATE (PF) 4 MG/ML IV SOLN
4.0000 mg | Freq: Once | INTRAVENOUS | Status: AC
  Administered 2019-02-18: 4 mg via INTRAMUSCULAR
  Filled 2019-02-18: qty 1

## 2019-02-18 MED ORDER — OXYCODONE-ACETAMINOPHEN 5-325 MG PO TABS
1.0000 | ORAL_TABLET | Freq: Once | ORAL | Status: AC
  Administered 2019-02-18: 1 via ORAL
  Filled 2019-02-18: qty 1

## 2019-02-18 NOTE — Discharge Instructions (Signed)
Please follow up with [facility]

## 2019-02-18 NOTE — ED Notes (Signed)
The pt  Returned from [place]

## 2019-02-18 NOTE — ED Notes (Signed)
To ultrasound

## 2019-02-18 NOTE — ED Triage Notes (Addendum)
Pt had IUD placed 1 week ago.  Woke up today with vaginal bleeding, vomiting, and abd cramping. Pt refused labwork at triage.

## 2019-02-18 NOTE — ED Provider Notes (Addendum)
[place] EMERGENCY DEPARTMENT Provider Note   CSN: [id number] Arrival date & time: 02/18/19  1235     History Chief Complaint  Patient presents with  . Vaginal Bleeding  . Abdominal Pain    Carmen Daniels is a 33 y.o. female with history of bipolar disorder presents with pelvic cramping and vaginal bleeding. She states she had an IUD placed at [facility] on 12/22. She states that initially the provider put in a Liletta but "messed up" and then put in a Mirena instead. She was told that she would have cramping and spotting for several days which she did. This morning she started to have severe pelvic cramping and has been passing multiple clots. This prompted her to come to the ED and she is requesting removal of the IUD today.  HPI     Past Medical History:  Diagnosis Date  . Allergic rhinitis   . Anxiety   . Asthma   . Back pain   . Bipolar disorder ([place])   . Chronic pain   . Migraines   . Nasal polyps   . Nausea   . Neck pain   . OCD (obsessive compulsive disorder)   . Ovarian cyst    left    Patient Active Problem List   Diagnosis Date Noted  . Intractable chronic 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 status migrainosus 02/08/2019  . History of elective abortion 12/28/2018  . Unwanted fertility 12/28/2018  . QT prolongation 07/03/2018  . AMS (altered mental status) 07/03/2018  . Serum total bilirubin elevated 07/03/2018  . Acute rhinosinusitis 07/03/2018  . Syncope 07/02/2018  . Sinus congestion 04/13/2017  . Borderline personality disorder ([place]) 04/11/2017  . Bipolar I disorder, current or most recent episode manic, with psychotic features ([place]) 04/10/2017  . Bipolar disorder ([place]) 04/10/2017  . Abnormal MRI of head 04/21/2016  . Chronic migraine without aura 03/27/2015  . Mild persistent asthma 12/30/2014  . Allergic rhinitis due to pollen 12/30/2014  . 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 Nasal polyposis 12/30/2014    Past Surgical  History:  Procedure Laterality Date  . NASAL SINUS SURGERY    . WISDOM TOOTH EXTRACTION       OB History   No obstetric history on file.     Family History  Problem Relation Age of Onset  . Healthy Father   . Healthy Mother   . Arthritis Maternal Grandmother   . Arthritis Maternal Grandfather   . Depression Paternal Grandmother     Social History   Tobacco Use  . Smoking status: Current Every Day Smoker    Packs/day: 0.50    Types: Cigarettes  . Smokeless tobacco: Never Used  Substance Use Topics  . Alcohol use: Yes    Alcohol/week: 0.0 standard drinks    Comment: Occasional alcohol use  . Drug use: No    Home Medications Prior to Admission medications   Medication Sig Start Date End Date Taking? Authorizing Provider  AJOVY 225 MG/1.5ML SOSY INJECT 225 MG INTO THE SKIN EVERY 30 (THIRTY) DAYS. Patient taking differently: Inject 225 mg into the skin every 30 (thirty) days.  10/23/18   Marcial Pacas, MD  albuterol (VENTOLIN HFA) 108 (90 Base) MCG/ACT inhaler Inhale 1-2 puffs into the lungs every 4 (four) hours as needed for wheezing or shortness of breath. 04/15/17   Money, Lowry Ram, FNP  beclomethasone (QVAR) 40 MCG/ACT inhaler Inhale 1 puff into the lungs 2 (two) times daily as needed (asthma).  [provider]  BOTOX 100 units SOLR injection MD TO INJECT 155 UNITS INTRAMUSCULARLY INTO HEAD AND NECK MUSCLES EVERY 3 MONTHS BY PROVIDER Patient taking differently: Inject 155 Units into the muscle every 3 (three) months. Head and neck muscles for migraine prevention 04/21/18   Marcial Pacas, MD  buPROPion (WELLBUTRIN XL) 150 MG 24 hr tablet Take by mouth.    [provider]  clonazePAM (KLONOPIN) 1 MG tablet Take 1 mg by mouth 4 (four) times daily.    [provider]  HYDROcodone-acetaminophen (NORCO) 10-325 MG tablet Take 1 tablet by mouth 2 (two) times daily as needed for moderate pain.    [provider]  lithium carbonate (ESKALITH) 450 MG CR  tablet Take 900 mg by mouth at bedtime.    [provider]  SUMAtriptan (IMITREX) 100 MG tablet TAKE 1 TABLET AT ONSET MAY REPEAT IN 2 HOURS IF NEEDED MAY DOSE 2 PER DAY OR 3 Patient taking differently: Take 100 mg by mouth every 2 (two) hours as needed for migraine. MAY DOSE 2 PER DAY OR 3 10/23/18   Marcial Pacas, MD  zolpidem (AMBIEN) 10 MG tablet Take 10 mg by mouth at bedtime as needed for sleep.     [provider]    Allergies    Keflex [cephalexin] and Flagyl [metronidazole]  Review of Systems   Review of Systems  Constitutional: Negative for fever.  Genitourinary: Positive for pelvic pain and vaginal bleeding. Negative for vaginal discharge.  Neurological: Negative for light-headedness.    Physical Exam Updated Vital Signs BP 118/75 (BP Location: Left Arm)   Pulse 86   Temp 98.8 F (37.1 C) (Oral)   Resp 16   SpO2 100%   Physical Exam Vitals and nursing note reviewed.  Constitutional:      General: She is not in acute distress.    Appearance: She is well-developed. She is not ill-appearing.  HENT:     Head: Normocephalic and atraumatic.  Eyes:     General: No scleral icterus.       Right eye: No discharge.        Left eye: No discharge.     Conjunctiva/sclera: Conjunctivae normal.     Pupils: Pupils are equal, round, and reactive to light.  Cardiovascular:     Rate and Rhythm: Normal rate.  Pulmonary:     Effort: Pulmonary effort is normal. No respiratory distress.  Abdominal:     General: Abdomen is flat. Bowel sounds are normal. There is no distension.     Palpations: Abdomen is soft.     Tenderness: There is no abdominal tenderness.  Genitourinary:    Comments: Pelvic: There is a moderate amount of bleeding in the vagina and coming from the cervical os. No clots. IUD strings are visible. Chaperone present during exam.  Musculoskeletal:     Cervical back: Normal range of motion.  Skin:    General: Skin is warm and dry.  Neurological:      Mental Status: She is alert and oriented to person, place, and time.  Psychiatric:        Behavior: Behavior normal.     ED Results / Procedures / Treatments   Labs (all labs ordered are listed, but only abnormal results are displayed) Labs Reviewed - No data to display  EKG None  Radiology US PELVIC COMPLETE W TRANSVAGINAL AND TORSION R/O  Result Date: 02/18/2019 CLINICAL DATA:  Vaginal bleeding for 1 week, heavy bleeding with clots today, IUD placed  1 week ago EXAM: TRANSABDOMINAL AND TRANSVAGINAL ULTRASOUND OF PELVIS DOPPLER ULTRASOUND OF OVARIES TECHNIQUE: Both transabdominal and transvaginal ultrasound examinations of the pelvis were performed. Transabdominal technique was performed for global imaging of the pelvis including uterus, ovaries, adnexal regions, and pelvic cul-de-sac. It was necessary to proceed with endovaginal exam following the transabdominal exam to visualize the uterus, endometrium, ovaries, and IUD. Color and duplex Doppler ultrasound was utilized to evaluate blood flow to the ovaries. COMPARISON:  None FINDINGS: Uterus Measurements: 7.4 x 3.4 x 5.0 cm = volume: 66 mL. Anteverted. Nabothian cyst at cervix. Normal morphology without mass Endometrium Thickness: 10 mm. IUD located at the upper uterine segment endometrial canal. No definite endometrial fluid or mass. Right ovary Measurements: 3.4 x 1.6 x 1.9 cm = volume: 5.5 mL. Normal morphology without mass. Left ovary Measurements: 3.7 x 2.0 x 2.6 cm = volume: 9.9 mL. Normal morphology without mass Pulsed Doppler evaluation of both ovaries demonstrates low resistance arterial and venous waveforms in both ovaries. Other findings No free pelvic fluid or adnexal masses. Imaging the pelvis limited by bowel gas. IMPRESSION: IUD located at the upper uterine segment endometrial canal. Otherwise negative exam. Electronically Signed   By: Lavonia Dana M.D.   On: 02/18/2019 16:46    Procedures .Foreign Body Removal  Date/Time:  02/18/2019 5:53 PM Performed by: Recardo Evangelist, PA-C Authorized by: Recardo Evangelist, PA-C  Consent: Verbal consent obtained. Risks and benefits: risks, benefits and alternatives were discussed Consent given by: patient Patient understanding: patient states understanding of the procedure being performed Patient consent: the patient's understanding of the procedure matches consent given Patient identity confirmed: verbally with patient and arm band Body area: vagina  Sedation: Patient sedated: no  Patient restrained: no Patient cooperative: yes Localization method: visualized Removal mechanism: forceps Complexity: simple 1 objects recovered. Objects recovered: IUD Post-procedure assessment: foreign body removed Patient tolerance: patient tolerated the procedure well with no immediate complications   (including critical care time)  Medications Ordered in ED Medications  ketorolac (TORADOL) injection 30 mg (has no administration in time range)  morphine 4 MG/ML injection 4 mg (4 mg Intramuscular Given 02/18/19 1513)  oxyCODONE-acetaminophen (PERCOCET/ROXICET) 5-325 MG per tablet 1 tablet (1 tablet Oral Given 02/18/19 1548)    ED Course  I have reviewed the triage vital signs and the nursing notes.  Pertinent labs & imaging results that were available during my care of the patient were reviewed by me and considered in my medical decision making (see chart for details).  33 year old female presents with pelvic cramping and heavy vaginal bleeding with nausea since this morning after IUD placement last week. Vitals are normal. Abdomen is soft and non-tender. Will obtain US to eval position. She has taking NSAIDs and Tylenol at home with no relief. IM Morphine given.  US shows IUD located in the upper uterine segment endometrial canal. No perforation or significant malposition.   Pelvic performed and IUD removed without difficulty. Advised OTC meds for pain. Encouraged pt f/u  with [facility].  MDM Rules/Calculators/A&P                       Final Clinical Impression(s) / ED Diagnoses Final diagnoses:  Vaginal bleeding  Pelvic cramping    Rx / DC Orders ED Discharge Orders    None       Recardo Evangelist, PA-C 02/18/19 1752    Recardo Evangelist, PA-C 02/18/19 1754    Lucrezia Starch,  MD 02/19/19 1638

## 2019-02-20 MED ORDER — LEVONORGESTREL 20 MCG/24HR IU IUD: INTRAUTERINE_SYSTEM | Freq: Once | INTRAUTERINE | Status: DC

## 2019-02-20 NOTE — Addendum Note (Signed)
Addended by: Delrae Alfred on: 02/20/2019 04:55 PM   Modules accepted: Orders

## 2019-02-21 ENCOUNTER — Encounter (HOSPITAL_COMMUNITY): Payer: Self-pay | Admitting: Emergency Medicine

## 2019-02-21 ENCOUNTER — Emergency Department (HOSPITAL_COMMUNITY)
Admission: EM | Admit: 2019-02-21 | Discharge: 2019-02-21 | Disposition: A | Discharge status: Home / Self Care | Payer: Medicaid Other | Attending: Emergency Medicine | Admitting: Emergency Medicine

## 2019-02-21 ENCOUNTER — Other Ambulatory Visit: Payer: Self-pay

## 2019-02-21 DIAGNOSIS — Z79899 Other long term (current) drug therapy: Secondary | ICD-10-CM | POA: Diagnosis not present

## 2019-02-21 DIAGNOSIS — N939 Abnormal uterine and vaginal bleeding, unspecified: Secondary | ICD-10-CM | POA: Insufficient documentation

## 2019-02-21 DIAGNOSIS — F319 Bipolar disorder, unspecified: Secondary | ICD-10-CM | POA: Insufficient documentation

## 2019-02-21 DIAGNOSIS — R102 Pelvic and perineal pain: Secondary | ICD-10-CM | POA: Diagnosis not present

## 2019-02-21 DIAGNOSIS — F17210 Nicotine dependence, cigarettes, uncomplicated: Secondary | ICD-10-CM | POA: Diagnosis not present

## 2019-02-21 LAB — COMPREHENSIVE METABOLIC PANEL
ALT: 12 U/L (ref 0–44)
AST: 17 U/L (ref 15–41)
Albumin: 3.7 g/dL (ref 3.5–5.0)
Alkaline Phosphatase: 44 U/L (ref 38–126)
Anion gap: 8 (ref 5–15)
BUN: 8 mg/dL (ref 6–20)
CO2: 21 mmol/L — ABNORMAL LOW (ref 22–32)
Calcium: 9 mg/dL (ref 8.9–10.3)
Chloride: 111 mmol/L (ref 98–111)
Creatinine, Ser: 0.74 mg/dL (ref 0.44–1.00)
GFR calc Af Amer: >60 mL/min (ref >60)
GFR calc non Af Amer: >60 mL/min (ref >60)
Glucose, Bld: 105 mg/dL — ABNORMAL HIGH (ref 70–99)
Potassium: 4 mmol/L (ref 3.5–5.1)
Sodium: 140 mmol/L (ref 135–145)
Total Bilirubin: 0.7 mg/dL (ref 0.3–1.2)
Total Protein: 6.3 g/dL — ABNORMAL LOW (ref 6.5–8.1)

## 2019-02-21 LAB — CBC
HCT: 36.8 % (ref 36.0–46.0)
Hemoglobin: 12.1 g/dL (ref 12.0–15.0)
MCH: 30.2 pg (ref 26.0–34.0)
MCHC: 32.9 g/dL (ref 30.0–36.0)
MCV: 91.8 fL (ref 80.0–100.0)
Platelets: 252 10*3/uL (ref 150–400)
RBC: 4.01 MIL/uL (ref 3.87–5.11)
RDW: 12.1 % (ref 11.5–15.5)
WBC: 5.6 10*3/uL (ref 4.0–10.5)
nRBC: 0 % (ref 0.0–0.2)

## 2019-02-21 LAB — URINALYSIS, ROUTINE W REFLEX MICROSCOPIC
Bilirubin Urine: NEGATIVE
Glucose, UA: NEGATIVE mg/dL
Hgb urine dipstick: NEGATIVE
Ketones, ur: NEGATIVE mg/dL
Leukocytes,Ua: NEGATIVE
Nitrite: NEGATIVE
Protein, ur: NEGATIVE mg/dL
Specific Gravity, Urine: 1.027 (ref 1.005–1.030)
pH: 5 (ref 5.0–8.0)

## 2019-02-21 LAB — I-STAT BETA HCG BLOOD, ED (MC, WL, AP ONLY): I-stat hCG, quantitative: <5 m[IU]/mL (ref <5)

## 2019-02-21 LAB — LIPASE, BLOOD: Lipase: 42 U/L (ref 11–51)

## 2019-02-21 MED ORDER — KETOROLAC TROMETHAMINE 60 MG/2ML IM SOLN
60.0000 mg | Freq: Once | INTRAMUSCULAR | Status: AC
  Administered 2019-02-21: 60 mg via INTRAMUSCULAR
  Filled 2019-02-21: qty 2

## 2019-02-21 MED ORDER — NAPROXEN 500 MG PO TABS: 500.0000 mg | ORAL_TABLET | Freq: Two times a day (BID) | ORAL | 0 refills | Status: DC

## 2019-02-21 NOTE — Discharge Instructions (Signed)
You can take the naprosyn for pain.  You may also take your percocet OR acetaminophen with the naprosyn.  Eat a meal when you take the naprosyn.  Do not take ibuprofen, aleve, or goody powders.  You can apply heating pads to your abdomen for pain control as well.  Please call [facility] for close follow up.

## 2019-02-21 NOTE — ED Triage Notes (Signed)
Pt reports being seen here for IUD complications and had it removed. Told by MD to come here if bleeding gets worse. Endorses heavy bleeding and abd cramping

## 2019-02-21 NOTE — ED Notes (Signed)
Pt verbalized understanding of discharge instructions. Prescriptions and follow up care reviewed. Pt brought to lobby via wheelchair to wait for ride.

## 2019-02-21 NOTE — ED Provider Notes (Signed)
[place] EMERGENCY DEPARTMENT Provider Note   CSN: [id number] Arrival date & time: 02/21/19  1113     History Chief Complaint  Patient presents with  . Vaginal Bleeding  . Abdominal Pain    Carmen Daniels is a 33 y.o. female.  The history is provided by the patient and medical records.  Vaginal Bleeding Associated symptoms: abdominal pain   Abdominal Pain Associated symptoms: vaginal bleeding    SHADON Daniels is a 33 y.o. female who presents to the Emergency Department complaining of vaginal bleeding.  She had a mirena IUD placed a little over a week ago at her OBGYN office.  After the IUD was placed she exerpienced heavy vaginal bleeding, abdominal cramping, dizziness as well as syncopal episodes.  She sees Dr. Geralyn Flash at [facility].  She was seen in the ED on 12/27 for similar sxs and had the IUD removed.  She is using period panties and cannot quantity how much bleeding she is having.  She is passing clots about the size of a quarter. Denies fevers.  She is having diarrhea, vomiting, dizziness, dysuria.  Not currently sexually active.      Past Medical History:  Diagnosis Date  . Allergic rhinitis   . Anxiety   . Asthma   . Back pain   . Bipolar disorder ([place])   . Chronic pain   . Migraines   . Nasal polyps   . Nausea   . Neck pain   . OCD (obsessive compulsive disorder)   . Ovarian cyst    left    Patient Active Problem List   Diagnosis Date Noted  . Intractable chronic 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 status migrainosus 02/08/2019  . History of elective abortion 12/28/2018  . Unwanted fertility 12/28/2018  . QT prolongation 07/03/2018  . AMS (altered mental status) 07/03/2018  . Serum total bilirubin elevated 07/03/2018  . Acute rhinosinusitis 07/03/2018  . Syncope 07/02/2018  . Sinus congestion 04/13/2017  . Borderline personality disorder ([place]) 04/11/2017  . Bipolar I disorder, current or most recent episode manic, with psychotic  features ([place]) 04/10/2017  . Bipolar disorder ([place]) 04/10/2017  . Abnormal MRI of head 04/21/2016  . Chronic migraine without aura 03/27/2015  . Mild persistent asthma 12/30/2014  . Allergic rhinitis due to pollen 12/30/2014  . 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 Nasal polyposis 12/30/2014    Past Surgical History:  Procedure Laterality Date  . NASAL SINUS SURGERY    . WISDOM TOOTH EXTRACTION       OB History   No obstetric history on file.     Family History  Problem Relation Age of Onset  . Healthy Father   . Healthy Mother   . Arthritis Maternal Grandmother   . Arthritis Maternal Grandfather   . Depression Paternal Grandmother     Social History   Tobacco Use  . Smoking status: Current Every Day Smoker    Packs/day: 0.50    Types: Cigarettes  . Smokeless tobacco: Never Used  Substance Use Topics  . Alcohol use: Yes    Alcohol/week: 0.0 standard drinks    Comment: Occasional alcohol use  . Drug use: No    Home Medications Prior to Admission medications   Medication Sig Start Date End Date Taking? Authorizing Provider  AJOVY 225 MG/1.5ML SOSY INJECT 225 MG INTO THE SKIN EVERY 30 (THIRTY) DAYS. Patient taking differently: Inject 225 mg into the skin every 30 (thirty) days.  10/23/18   Marcial Pacas, MD  albuterol (VENTOLIN HFA) 108 (90 Base) MCG/ACT inhaler Inhale 1-2 puffs into the lungs every 4 (four) hours as needed for wheezing or shortness of breath. 04/15/17   Money, Lowry Ram, FNP  beclomethasone (QVAR) 40 MCG/ACT inhaler Inhale 1 puff into the lungs 2 (two) times daily as needed (asthma).     [provider]  BOTOX 100 units SOLR injection MD TO INJECT 155 UNITS INTRAMUSCULARLY INTO HEAD AND NECK MUSCLES EVERY 3 MONTHS BY PROVIDER Patient taking differently: Inject 155 Units into the muscle every 3 (three) months. Head and neck muscles for migraine prevention 04/21/18   Marcial Pacas, MD  buPROPion (WELLBUTRIN XL) 300 MG 24 hr tablet Take 300 mg by mouth  daily. 12/20/18   [provider]  clonazePAM (KLONOPIN) 1 MG tablet Take 1 mg by mouth 4 (four) times daily.    [provider]  cyclobenzaprine (FLEXERIL) 10 MG tablet Take 10 mg by mouth 3 (three) times daily as needed for muscle spasms.  01/17/19   [provider]  HYDROcodone-acetaminophen (NORCO) 10-325 MG tablet Take 1 tablet by mouth 2 (two) times daily as needed for moderate pain.    [provider]  lithium carbonate (ESKALITH) 450 MG CR tablet Take 900 mg by mouth at bedtime.    [provider]  naproxen (NAPROSYN) 500 MG tablet Take 1 tablet (500 mg total) by mouth 2 (two) times daily with a meal. 02/21/19   Quintella Reichert, MD  promethazine (PHENERGAN) 25 MG tablet Take 25 mg by mouth 3 (three) times daily as needed for nausea.  10/20/18   [provider]  propranolol (INDERAL) 10 MG tablet Take 10 mg by mouth 3 (three) times daily as needed (Anxiety).  11/05/18   [provider]  SUMAtriptan (IMITREX) 100 MG tablet TAKE 1 TABLET AT ONSET MAY REPEAT IN 2 HOURS IF NEEDED MAY DOSE 2 PER DAY OR 3 Patient taking differently: Take 100 mg by mouth every 2 (two) hours as needed for migraine. MAY DOSE 2 PER DAY OR 3 10/23/18   Marcial Pacas, MD  zolpidem (AMBIEN) 10 MG tablet Take 10 mg by mouth at bedtime.     [provider]    Allergies    Keflex [cephalexin] and Flagyl [metronidazole]  Review of Systems   Review of Systems  Gastrointestinal: Positive for abdominal pain.  Genitourinary: Positive for vaginal bleeding.  All other systems reviewed and are negative.   Physical Exam Updated Vital Signs BP 137/86   Pulse 87   Temp 98.9 F (37.2 C) (Oral)   Resp 16   SpO2 100%   Physical Exam Vitals and nursing note reviewed.  Constitutional:      Appearance: She is well-developed.  HENT:     Head: Normocephalic and atraumatic.  Cardiovascular:     Rate and Rhythm: Regular rhythm. Tachycardia present.     Heart  sounds: No murmur.  Pulmonary:     Effort: Pulmonary effort is normal. No respiratory distress.     Breath sounds: Normal breath sounds.  Abdominal:     Palpations: Abdomen is soft.     Tenderness: There is no guarding or rebound.     Comments: Mild lower abdominal tenderness  Genitourinary:    Comments: Moderate vaginal bleeding with dark red blood.  Os open.  No vaginal discharge.  No tissue in os.   Musculoskeletal:        General: No swelling or tenderness.  Skin:    General: Skin is warm  and dry.  Neurological:     Mental Status: She is alert and oriented to person, place, and time.  Psychiatric:     Comments: Anxious appearing     ED Results / Procedures / Treatments   Labs (all labs ordered are listed, but only abnormal results are displayed) Labs Reviewed  COMPREHENSIVE METABOLIC PANEL - Abnormal; Notable for the following components:      Result Value   CO2 21 (*)    Glucose, Bld 105 (*)    Total Protein 6.3 (*)    All other components within normal limits  LIPASE, BLOOD  CBC  URINALYSIS, ROUTINE W REFLEX MICROSCOPIC  I-STAT BETA HCG BLOOD, ED ([facility], [facility], [facility] ONLY)  TYPE AND SCREEN    EKG None  Radiology No results found.  Procedures Procedures (including critical care time)  Medications Ordered in ED Medications  ketorolac (TORADOL) injection 60 mg (60 mg Intramuscular Given 02/21/19 1643)    ED Course  I have reviewed the triage vital signs and the nursing notes.  Pertinent labs & imaging results that were available during my care of the patient were reviewed by me and considered in my medical decision making (see chart for details).    MDM Rules/Calculators/A&P                     Patient here for evaluation of abdominal pain, vaginally bleeding after IUD placement and removal. She does have bleeding on examination with no evidence of acute infection, perforation, hemorrhage. Labs with normal hemoglobin, UA not consistent with UTI. Treated with  Toradol for pain in the emergency department. Attempted to contact on-call physician with [facility], with no answer. Discussed with patient home care for vaginal bleeding and abdominal cramping. Discussed anti-inflammatory medication for pain control, will prescribe naproxen. Patient was just prescribed Percocet on December 28. Discussed that she may use the Percocet or over-the-counter Tylenol in addition to naproxen for pain management. Discussed application of heating pads to the abdominal wall as well. Discussed importance of OB/GYN follow-up for further evaluation and management of her symptoms.  Final Clinical Impression(s) / ED Diagnoses Final diagnoses:  Vaginal bleeding    Rx / DC Orders ED Discharge Orders         Ordered    naproxen (NAPROSYN) 500 MG tablet  2 times daily with meals     02/21/19 1617           Quintella Reichert, MD 02/21/19 Bosie Helper

## 2019-02-23 LAB — BPAM RBC
Blood Product Expiration Date: 202101162359
Blood Product Expiration Date: 202101182359
Unit Type and Rh: 1700
Unit Type and Rh: 1700

## 2019-02-23 LAB — TYPE AND SCREEN
ABO/RH(D): B NEG
Antibody Screen: POSITIVE
Unit division: 0
Unit division: 0

## 2019-02-27 ENCOUNTER — Institutional Professional Consult (permissible substitution): Payer: Medicaid Other | Admitting: Obstetrics & Gynecology

## 2019-02-28 ENCOUNTER — Ambulatory Visit: Payer: Medicaid Other | Admitting: Obstetrics and Gynecology

## 2019-02-28 ENCOUNTER — Other Ambulatory Visit: Payer: Self-pay

## 2019-02-28 ENCOUNTER — Encounter: Payer: Self-pay | Admitting: Obstetrics and Gynecology

## 2019-02-28 VITALS — BP 119/79 | HR 103 | Temp 98.7°F | Ht 71.0 in | Wt 191.6 lb

## 2019-02-28 DIAGNOSIS — N920 Excessive and frequent menstruation with regular cycle: Secondary | ICD-10-CM

## 2019-02-28 DIAGNOSIS — Z3009 Encounter for other general counseling and advice on contraception: Secondary | ICD-10-CM

## 2019-02-28 MED ORDER — TRANEXAMIC ACID 650 MG PO TABS: 1300.0000 mg | ORAL_TABLET | Freq: Three times a day (TID) | ORAL | 2 refills | Status: DC

## 2019-02-28 NOTE — Progress Notes (Signed)
GYN presents for Tubal Consult.

## 2019-02-28 NOTE — Progress Notes (Signed)
Patient ID: Carmen Daniels, female   DOB: [birth year], 34 y.o.   MRN: [id number] [name] returns with her mother to discuss BTL. She had an IUD placed on 02/13/19. However she presented to the ER on 02/18/19 with c/o painful cramps and bleeding. U/S was unremarkable. Pt elected to have IUD removed. This was accomplished while she was in the ER. She has been seen in the ER one more time and by her PCP for cramps and bleeding. Today she reports that her cramps are improving. Still some spotting.  PE AF VSS Lungs clear Heart RRR Abd soft + BS  A/P Menorrhagia        Dysmenorrhea        Unwanted fertility  Unable to located [place] papers from 12/28/18 visit. Resigned today. Pt reassured in regrads to the bleeding. Discussed Tx options for bleeding. Will try Lystedia. U/R/B reviewed. Pt desires endometrial ablation. I do not feel the pt is a good candidate for this due to her recent response to IUD placement.  Pt to consider options. Will proceed with scheduling BTL. For now. Information provided to pt. R/B/Failur rate and post op care reviewed with pt. She will follow up post op or PRN

## 2019-02-28 NOTE — Patient Instructions (Signed)
Laparoscopic Tubal Ligation, Care After This sheet gives you information about how to care for yourself after your procedure. Your health care provider may also give you more specific instructions. If you have problems or questions, contact your health care provider. What can I expect after the procedure? After the procedure, it is common to have:  A sore throat.  Discomfort in your shoulder.  Mild discomfort or cramping in your abdomen.  Gas pains.  Pain or soreness in the area where the surgical incision was made.  A bloated feeling.  Tiredness.  Nausea.  Vomiting. Follow these instructions at home: Medicines  Take over-the-counter and prescription medicines only as told by your health care provider.  Do not take aspirin because it can cause bleeding.  Ask your health care provider if the medicine prescribed to you: ? Requires you to avoid driving or using heavy machinery. ? Can cause constipation. You may need to take actions to prevent or treat constipation, such as:  Drink enough fluid to keep your urine pale yellow.  Take over-the-counter or prescription medicines.  Eat foods that are high in fiber, such as beans, whole grains, and fresh fruits and vegetables.  Limit foods that are high in fat and processed sugars, such as fried or sweet foods. Incision care      Follow instructions from your health care provider about how to take care of your incision. Make sure you: ? Wash your hands with soap and water before and after you change your bandage (dressing). If soap and water are not available, use hand sanitizer. ? Change your dressing as told by your health care provider. ? Leave stitches (sutures), skin glue, or adhesive strips in place. These skin closures may need to stay in place for 2 weeks or longer. If adhesive strip edges start to loosen and curl up, you may trim the loose edges. Do not remove adhesive strips completely unless your health care provider  tells you to do that.  Check your incision area every day for signs of infection. Check for: ? Redness, swelling, or pain. ? Fluid or blood. ? Warmth. ? Pus or a bad smell. Activity  Rest as told by your health care provider.  Avoid sitting for a long time without moving. Get up to take short walks every 1-2 hours. This is important to improve blood flow and breathing. Ask for help if you feel weak or unsteady.  Return to your normal activities as told by your health care provider. Ask your health care provider what activities are safe for you. General instructions  Do not take baths, swim, or use a hot tub until your health care provider approves. Ask your health care provider if you may take showers. You may only be allowed to take sponge baths.  Have someone help you with your daily household tasks for the first few days.  Keep all follow-up visits as told by your health care provider. This is important. Contact a health care provider if:  You have redness, swelling, or pain around your incision.  Your incision feels warm to the touch.  You have pus or a bad smell coming from your incision.  The edges of your incision break open after the sutures have been removed.  Your pain does not improve after 2-3 days.  You have a rash.  You repeatedly become dizzy or light-headed.  Your pain medicine is not helping. Get help right away if you:  Have a fever.  Faint.  Have increasing   pain in your abdomen.  Have severe pain in one or both of your shoulders.  Have fluid or blood coming from your sutures or from your vagina.  Have shortness of breath or difficulty breathing.  Have chest pain or leg pain.  Have ongoing nausea, vomiting, or diarrhea. Summary  After the procedure, it is common to have mild discomfort or cramping in your abdomen.  Take over-the-counter and prescription medicines only as told by your health care provider.  Watch for symptoms that should  prompt you to call your health care provider.  Keep all follow-up visits as told by your health care provider. This is important. This information is not intended to replace advice given to you by your health care provider. Make sure you discuss any questions you have with your health care provider. Document Revised: 07/18/2018 Document Reviewed: 01/03/2018 Elsevier Patient Education  2020 [employer]  

## 2019-03-13 ENCOUNTER — Ambulatory Visit: Payer: Medicaid Other | Admitting: Advanced Practice Midwife

## 2019-03-19 ENCOUNTER — Other Ambulatory Visit: Payer: Self-pay | Admitting: *Deleted

## 2019-03-19 ENCOUNTER — Telehealth: Payer: Self-pay | Admitting: Neurology

## 2019-03-19 MED ORDER — AJOVY 225 MG/1.5ML ~~LOC~~ SOAJ: 225.0000 mg | SUBCUTANEOUS | 11 refills | Status: DC

## 2019-03-19 NOTE — Telephone Encounter (Signed)
Pt is asking if she can get the auto injector for her AJOVY 225 MG/1.5ML SOSY, please call to discuss

## 2019-03-19 NOTE — Telephone Encounter (Addendum)
Per vo by Dr. Krista Blue, okay to provide this prescription. It has been sent to the pharmacy. I left the patient a message letting her know the new rx has been sent.

## 2019-03-28 ENCOUNTER — Inpatient Hospital Stay (HOSPITAL_COMMUNITY)
Admission: EM | Admit: 2019-03-28 | Discharge: 2019-04-10 | DRG: 917 | Disposition: A | Discharge status: Other Acute Inpatient Hospital | Payer: Medicaid Other | Attending: Internal Medicine | Admitting: Internal Medicine

## 2019-03-28 ENCOUNTER — Encounter (HOSPITAL_BASED_OUTPATIENT_CLINIC_OR_DEPARTMENT_OTHER): Payer: Self-pay | Admitting: Obstetrics and Gynecology

## 2019-03-28 ENCOUNTER — Encounter (HOSPITAL_COMMUNITY): Payer: Self-pay

## 2019-03-28 ENCOUNTER — Other Ambulatory Visit: Payer: Self-pay

## 2019-03-28 ENCOUNTER — Emergency Department (HOSPITAL_COMMUNITY): Payer: Medicaid Other

## 2019-03-28 DIAGNOSIS — R17 Unspecified jaundice: Secondary | ICD-10-CM | POA: Diagnosis not present

## 2019-03-28 DIAGNOSIS — L03113 Cellulitis of right upper limb: Secondary | ICD-10-CM | POA: Diagnosis present

## 2019-03-28 DIAGNOSIS — Z915 Personal history of self-harm: Secondary | ICD-10-CM

## 2019-03-28 DIAGNOSIS — Z8261 Family history of arthritis: Secondary | ICD-10-CM

## 2019-03-28 DIAGNOSIS — E876 Hypokalemia: Secondary | ICD-10-CM | POA: Diagnosis not present

## 2019-03-28 DIAGNOSIS — D649 Anemia, unspecified: Secondary | ICD-10-CM | POA: Diagnosis not present

## 2019-03-28 DIAGNOSIS — T43291A Poisoning by other antidepressants, accidental (unintentional), initial encounter: Secondary | ICD-10-CM

## 2019-03-28 DIAGNOSIS — Z818 Family history of other mental and behavioral disorders: Secondary | ICD-10-CM

## 2019-03-28 DIAGNOSIS — T50902A Poisoning by unspecified drugs, medicaments and biological substances, intentional self-harm, initial encounter: Secondary | ICD-10-CM

## 2019-03-28 DIAGNOSIS — F603 Borderline personality disorder: Secondary | ICD-10-CM | POA: Diagnosis present

## 2019-03-28 DIAGNOSIS — R0902 Hypoxemia: Secondary | ICD-10-CM

## 2019-03-28 DIAGNOSIS — Z888 Allergy status to other drugs, medicaments and biological substances status: Secondary | ICD-10-CM

## 2019-03-28 DIAGNOSIS — G43909 Migraine, unspecified, not intractable, without status migrainosus: Secondary | ICD-10-CM | POA: Diagnosis present

## 2019-03-28 DIAGNOSIS — G894 Chronic pain syndrome: Secondary | ICD-10-CM | POA: Diagnosis present

## 2019-03-28 DIAGNOSIS — R9431 Abnormal electrocardiogram [ECG] [EKG]: Secondary | ICD-10-CM

## 2019-03-28 DIAGNOSIS — I952 Hypotension due to drugs: Secondary | ICD-10-CM | POA: Diagnosis not present

## 2019-03-28 DIAGNOSIS — F429 Obsessive-compulsive disorder, unspecified: Secondary | ICD-10-CM | POA: Diagnosis present

## 2019-03-28 DIAGNOSIS — J9600 Acute respiratory failure, unspecified whether with hypoxia or hypercapnia: Secondary | ICD-10-CM

## 2019-03-28 DIAGNOSIS — G92 Toxic encephalopathy: Secondary | ICD-10-CM | POA: Diagnosis not present

## 2019-03-28 DIAGNOSIS — F17210 Nicotine dependence, cigarettes, uncomplicated: Secondary | ICD-10-CM | POA: Diagnosis present

## 2019-03-28 DIAGNOSIS — Z4659 Encounter for fitting and adjustment of other gastrointestinal appliance and device: Secondary | ICD-10-CM

## 2019-03-28 DIAGNOSIS — T424X2A Poisoning by benzodiazepines, intentional self-harm, initial encounter: Principal | ICD-10-CM | POA: Diagnosis present

## 2019-03-28 DIAGNOSIS — Z978 Presence of other specified devices: Secondary | ICD-10-CM

## 2019-03-28 DIAGNOSIS — I10 Essential (primary) hypertension: Secondary | ICD-10-CM | POA: Diagnosis present

## 2019-03-28 DIAGNOSIS — F319 Bipolar disorder, unspecified: Secondary | ICD-10-CM | POA: Diagnosis present

## 2019-03-28 DIAGNOSIS — J80 Acute respiratory distress syndrome: Secondary | ICD-10-CM | POA: Diagnosis not present

## 2019-03-28 DIAGNOSIS — T4275XA Adverse effect of unspecified antiepileptic and sedative-hypnotic drugs, initial encounter: Secondary | ICD-10-CM | POA: Diagnosis not present

## 2019-03-28 DIAGNOSIS — Z881 Allergy status to other antibiotic agents status: Secondary | ICD-10-CM

## 2019-03-28 DIAGNOSIS — B373 Candidiasis of vulva and vagina: Secondary | ICD-10-CM | POA: Diagnosis not present

## 2019-03-28 DIAGNOSIS — Z20822 Contact with and (suspected) exposure to covid-19: Secondary | ICD-10-CM | POA: Diagnosis present

## 2019-03-28 DIAGNOSIS — J690 Pneumonitis due to inhalation of food and vomit: Secondary | ICD-10-CM | POA: Diagnosis not present

## 2019-03-28 DIAGNOSIS — J9690 Respiratory failure, unspecified, unspecified whether with hypoxia or hypercapnia: Secondary | ICD-10-CM

## 2019-03-28 DIAGNOSIS — F419 Anxiety disorder, unspecified: Secondary | ICD-10-CM | POA: Diagnosis present

## 2019-03-28 DIAGNOSIS — G40901 Epilepsy, unspecified, not intractable, with status epilepticus: Secondary | ICD-10-CM | POA: Diagnosis not present

## 2019-03-28 DIAGNOSIS — J9601 Acute respiratory failure with hypoxia: Secondary | ICD-10-CM

## 2019-03-28 DIAGNOSIS — T82594A Other mechanical complication of infusion catheter, initial encounter: Secondary | ICD-10-CM

## 2019-03-28 LAB — COMPREHENSIVE METABOLIC PANEL
ALT: 15 U/L (ref 0–44)
AST: 25 U/L (ref 15–41)
Albumin: 4.1 g/dL (ref 3.5–5.0)
Alkaline Phosphatase: 55 U/L (ref 38–126)
Anion gap: 14 (ref 5–15)
BUN: 15 mg/dL (ref 6–20)
CO2: 24 mmol/L (ref 22–32)
Calcium: 9.8 mg/dL (ref 8.9–10.3)
Chloride: 102 mmol/L (ref 98–111)
Creatinine, Ser: 0.81 mg/dL (ref 0.44–1.00)
GFR calc Af Amer: >60 mL/min (ref >60)
GFR calc non Af Amer: >60 mL/min (ref >60)
Glucose, Bld: 111 mg/dL — ABNORMAL HIGH (ref 70–99)
Potassium: 4.1 mmol/L (ref 3.5–5.1)
Sodium: 140 mmol/L (ref 135–145)
Total Bilirubin: 1.1 mg/dL (ref 0.3–1.2)
Total Protein: 7.4 g/dL (ref 6.5–8.1)

## 2019-03-28 LAB — RAPID URINE DRUG SCREEN, HOSP PERFORMED
Amphetamines: NOT DETECTED
Barbiturates: NOT DETECTED
Benzodiazepines: POSITIVE — AB
Cocaine: NOT DETECTED
Opiates: NOT DETECTED
Tetrahydrocannabinol: NOT DETECTED

## 2019-03-28 LAB — CBC
HCT: 44.6 % (ref 36.0–46.0)
Hemoglobin: 14.3 g/dL (ref 12.0–15.0)
MCH: 30.1 pg (ref 26.0–34.0)
MCHC: 32.1 g/dL (ref 30.0–36.0)
MCV: 93.9 fL (ref 80.0–100.0)
Platelets: 305 10*3/uL (ref 150–400)
RBC: 4.75 MIL/uL (ref 3.87–5.11)
RDW: 12.2 % (ref 11.5–15.5)
WBC: 21 10*3/uL — ABNORMAL HIGH (ref 4.0–10.5)
nRBC: 0 % (ref 0.0–0.2)

## 2019-03-28 LAB — ETHANOL: Alcohol, Ethyl (B): <10 mg/dL (ref <10)

## 2019-03-28 LAB — CBG MONITORING, ED: Glucose-Capillary: 118 mg/dL — ABNORMAL HIGH (ref 70–99)

## 2019-03-28 LAB — PREGNANCY, URINE: Preg Test, Ur: NEGATIVE

## 2019-03-28 LAB — ACETAMINOPHEN LEVEL: Acetaminophen (Tylenol), Serum: <10 ug/mL — ABNORMAL LOW (ref 10–30)

## 2019-03-28 LAB — SALICYLATE LEVEL: Salicylate Lvl: <7 mg/dL — ABNORMAL LOW (ref 7.0–30.0)

## 2019-03-28 NOTE — ED Provider Notes (Signed)
[facility] DEPT Provider Note   CSN: [id number] Arrival date & time: 03/28/19  2105     History Chief Complaint  Patient presents with  . Drug Overdose    Carmen Daniels is a 34 y.o. female.  Patient arrives by ambulance after being called for altered mental status overdose. Level 5 caveat. Apparently there were prescriptions for lithium Ambien Flexeril Wellbutrin and pills were strewn about the room.  History of crack use.  History of suicidal attempts.  Per EMS she was conscious and able to answer with head shaking but was not speaking.  She was given Narcan for depressed respiratory rate.  She is not answering any questions here to me.  Eyes open, but not interacting.  The history is provided by the EMS personnel.  Drug Overdose This is a new problem. Episode onset: unknown. The problem has not changed since onset.Nothing aggravates the symptoms. Nothing relieves the symptoms. She has tried nothing for the symptoms. The treatment provided no relief.       Past Medical History:  Diagnosis Date  . Acute rhinosinusitis 07/03/2018  . Allergic rhinitis   . Anxiety   . Asthma   . Back pain   . Bipolar disorder ([place])   . Borderline personality disorder ([place])   . Chronic pain   . Chronic pain syndrome   . Depression   . Hallucinations   . Migraines   . Nasal polyps   . Nausea   . Neck pain   . OCD (obsessive compulsive disorder)   . OCD (obsessive compulsive disorder)   . Ovarian cyst    left    Patient Active Problem List   Diagnosis Date Noted  . Menorrhagia 02/28/2019  . Intractable chronic 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 status migrainosus 02/08/2019  . History of elective abortion 12/28/2018  . Unwanted fertility 12/28/2018  . QT prolongation 07/03/2018  . AMS (altered mental status) 07/03/2018  . Serum total bilirubin elevated 07/03/2018  . Syncope 07/02/2018  . Sinus congestion 04/13/2017  . Borderline personality disorder  ([place]) 04/11/2017  . Bipolar I disorder, current or most recent episode manic, with psychotic features ([place]) 04/10/2017  . Bipolar disorder ([place]) 04/10/2017  . Abnormal MRI of head 04/21/2016  . Chronic migraine without aura 03/27/2015  . Mild persistent asthma 12/30/2014  . Allergic rhinitis due to pollen 12/30/2014  . 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 Nasal polyposis 12/30/2014    Past Surgical History:  Procedure Laterality Date  . NASAL SINUS SURGERY    . WISDOM TOOTH EXTRACTION       OB History   No obstetric history on file.     Family History  Problem Relation Age of Onset  . Healthy Father   . Healthy Mother   . Arthritis Maternal Grandmother   . Arthritis Maternal Grandfather   . Depression Paternal Grandmother     Social History   Tobacco Use  . Smoking status: Current Every Day Smoker    Packs/day: 0.50    Types: Cigarettes  . Smokeless tobacco: Never Used  Substance Use Topics  . Alcohol use: Yes    Alcohol/week: 0.0 standard drinks    Comment: Occasional alcohol use  . Drug use: No    Home Medications Prior to Admission medications   Medication Sig Start Date End Date Taking? Authorizing Provider  albuterol (VENTOLIN HFA) 108 (90 Base) MCG/ACT inhaler Inhale 1-2 puffs into the lungs every 4 (four) hours as needed for wheezing or shortness  of breath. 04/15/17   Money, Lowry Ram, FNP  beclomethasone (QVAR) 40 MCG/ACT inhaler Inhale 1 puff into the lungs 2 (two) times daily as needed (asthma).     [provider]  BOTOX 100 units SOLR injection MD TO INJECT 155 UNITS INTRAMUSCULARLY INTO HEAD AND NECK MUSCLES EVERY 3 MONTHS BY PROVIDER Patient taking differently: Inject 155 Units into the muscle every 3 (three) months. Head and neck muscles for migraine prevention 04/21/18   Marcial Pacas, MD  buPROPion (WELLBUTRIN XL) 300 MG 24 hr tablet Take 300 mg by mouth daily. 12/20/18   [provider]  clonazePAM (KLONOPIN) 1 MG tablet Take 1 mg by  mouth 4 (four) times daily.    [provider]  cyclobenzaprine (FLEXERIL) 10 MG tablet Take 10 mg by mouth 3 (three) times daily as needed for muscle spasms.  01/17/19   [provider]  Fremanezumab-vfrm (AJOVY) 225 MG/1.5ML SOAJ Inject 225 mg into the skin every 30 (thirty) days. 03/19/19   Marcial Pacas, MD  HYDROcodone-acetaminophen (NORCO) 10-325 MG tablet Take 1 tablet by mouth 2 (two) times daily as needed for moderate pain.    [provider]  lithium carbonate (ESKALITH) 450 MG CR tablet Take 900 mg by mouth at bedtime.    [provider]  promethazine (PHENERGAN) 25 MG tablet Take 25 mg by mouth 3 (three) times daily as needed for nausea.  10/20/18   [provider]  propranolol (INDERAL) 10 MG tablet Take 10 mg by mouth 3 (three) times daily as needed (Anxiety).  11/05/18   [provider]  SUMAtriptan (IMITREX) 100 MG tablet TAKE 1 TABLET AT ONSET MAY REPEAT IN 2 HOURS IF NEEDED MAY DOSE 2 PER DAY OR 3 Patient taking differently: Take 100 mg by mouth every 2 (two) hours as needed for migraine. MAY DOSE 2 PER DAY OR 3 10/23/18   Marcial Pacas, MD  tranexamic acid (LYSTEDA) 650 MG TABS tablet Take 2 tablets (1,300 mg total) by mouth 3 (three) times daily. Take during menses for a maximum of five days 02/28/19   Chancy Milroy, MD  zolpidem (AMBIEN) 10 MG tablet Take 10 mg by mouth at bedtime.     [provider]    Allergies    Keflex [cephalexin] and Flagyl [metronidazole]  Review of Systems   Review of Systems  Unable to perform ROS: Mental status change    Physical Exam Updated Vital Signs BP 132/80   Pulse (!) 101   Temp (!) 97.5 F (36.4 C) (Oral)   Resp (!) 26   LMP  (LMP Unknown)   SpO2 100%   Physical Exam Vitals and nursing note reviewed.  Constitutional:      General: She is not in acute distress.    Appearance: She is well-developed.  HENT:     Head: Normocephalic and atraumatic.  Eyes:     Extraocular  Movements: Extraocular movements intact.     Conjunctiva/sclera: Conjunctivae normal.     Pupils: Pupils are equal, round, and reactive to light.     Comments: Pupils 8 mm bilaterally and reactive.  Cardiovascular:     Rate and Rhythm: Normal rate and regular rhythm.     Heart sounds: No murmur.  Pulmonary:     Effort: Pulmonary effort is normal. No respiratory distress.     Breath sounds: Normal breath sounds.  Abdominal:     Palpations: Abdomen is soft.     Tenderness: There is no abdominal tenderness.  Musculoskeletal:        General: No deformity or signs of injury. Normal range of motion.     Cervical back: Neck supple.  Skin:    General: Skin is warm and dry.     Capillary Refill: Capillary refill takes less than 2 seconds.  Neurological:     Mental Status: She is alert.     Comments: Patient with eyes open staring intermittently twitching but not seizure activity.  Not answering any questions.  No focal deficits.     ED Results / Procedures / Treatments   Labs (all labs ordered are listed, but only abnormal results are displayed) Labs Reviewed  COMPREHENSIVE METABOLIC PANEL - Abnormal; Notable for the following components:      Result Value   Glucose, Bld 111 (*)    All other components within normal limits  SALICYLATE LEVEL - Abnormal; Notable for the following components:   Salicylate Lvl [id number] (*)    All other components within normal limits  ACETAMINOPHEN LEVEL - Abnormal; Notable for the following components:   Acetaminophen (Tylenol), Serum <10 (*)    All other components within normal limits  CBC - Abnormal; Notable for the following components:   WBC 21.0 (*)    All other components within normal limits  RAPID URINE DRUG SCREEN, HOSP PERFORMED - Abnormal; Notable for the following components:   Benzodiazepines POSITIVE (*)    All other components within normal limits  URINALYSIS, ROUTINE W REFLEX MICROSCOPIC - Abnormal; Notable for the following components:     APPearance HAZY (*)    All other components within normal limits  LITHIUM LEVEL - Abnormal; Notable for the following components:   Lithium Lvl 0.11 (*)    All other components within normal limits  BLOOD GAS, ARTERIAL - Abnormal; Notable for the following components:   pO2, Arterial 116 (*)    All other components within normal limits  CBG MONITORING, ED - Abnormal; Notable for the following components:   Glucose-Capillary 118 (*)    All other components within normal limits  RESPIRATORY PANEL BY RT PCR (FLU A&B, COVID)  ETHANOL  PREGNANCY, URINE  CREATININE, SERUM    EKG EKG Interpretation  Date/Time:  Wednesday March 28 2019 21:47:47 EST Ventricular Rate:  102 PR Interval:    QRS Duration: 100 QT Interval:  388 QTC Calculation: 506 R Axis:   48 Text Interpretation: Sinus tachycardia Probable left atrial enlargement RSR' in V1 or V2, probably normal variant Prolonged QT interval new QTc prolong since prior 5/20 Confirmed by Aletta Edouard [phone]) on 03/28/2019 9:57:21 PM   Radiology CT Head Wo Contrast  Result Date: 03/28/2019 CLINICAL DATA:  Change in mental status EXAM: CT HEAD WITHOUT CONTRAST TECHNIQUE: Contiguous axial images were obtained from the base of the skull through the vertex without intravenous contrast. COMPARISON:  Jul 16, 2018 FINDINGS: Brain: No evidence of acute territorial infarction, hemorrhage, hydrocephalus,extra-axial collection or mass lesion/mass effect. Normal gray-white differentiation. Ventricles are normal in size and contour. Vascular: No hyperdense vessel or unexpected calcification. Skull: The skull is intact. No fracture or focal lesion identified. Sinuses/Orbits: The visualized paranasal sinuses and mastoid air cells are clear. The orbits and globes intact. Other: None IMPRESSION: No acute intracranial abnormality. Electronically Signed   By: Prudencio Pair M.D.   On: 03/28/2019 23:24   DG Chest Port 1 View  Result Date: 03/29/2019 CLINICAL  DATA:  Leukocytosis EXAM: PORTABLE CHEST 1 VIEW COMPARISON:  07/02/2018 FINDINGS: The heart size and mediastinal contours  are within normal limits. Both lungs are clear. The visualized skeletal structures are unremarkable. IMPRESSION: No active disease. Electronically Signed   By: Ulyses Jarred M.D.   On: 03/29/2019 00:36    Procedures .Critical Care Performed by: Hayden Rasmussen, MD Authorized by: Hayden Rasmussen, MD   Critical care provider statement:    Critical care time (minutes):  45   Critical care time was exclusive of:  Separately billable procedures and treating other patients   Critical care was necessary to treat or prevent imminent or life-threatening deterioration of the following conditions:  CNS failure or compromise and cardiac failure   Critical care was time spent personally by me on the following activities:  Evaluation of patient's response to treatment, examination of patient, ordering and performing treatments and interventions, ordering and review of laboratory studies, ordering and review of radiographic studies, pulse oximetry, re-evaluation of patient's condition, obtaining history from patient or surrogate and review of old charts   I assumed direction of critical care for this patient from another provider in my specialty: no     (including critical care time)  Medications Ordered in ED Medications  magnesium sulfate IVPB 2 g 50 mL (has no administration in time range)  sodium bicarbonate 150 mEq in dextrose 5% 1000 mL infusion (has no administration in time range)    ED Course  I have reviewed the triage vital signs and the nursing notes.  Pertinent labs & imaging results that were available during my care of the patient were reviewed by me and considered in my medical decision making (see chart for details).  Clinical Course as of Mar 29 47  Wed Mar 28, 2019  2302 Differential diagnosis includes polypharmacy overdose, psychiatric disorder, CNS bleed  or mass.   [MB]  2303 Patient's CBC shows an elevated white count of 21 although no objective evidence of any infectious symptoms.   [MB]  2322 Tried to reinterview patient.  She makes eye contact but will not speak or nod any answers.   [MB]  Thu Mar 29, 2019  0036 Patient placed on an IVC for possible suicide attempt.  Signed out to Dr. Leonette Monarch with lithium level urinalysis still pending.  At this point patient not responding and so would continue to observe to see if her mental status clears.   [MB]  [id number] QTC is gone from 506 to 524.  We will give magnesium bicarb and continue cardiac monitoring.   [MB]    Clinical Course User Index [MB] Hayden Rasmussen, MD   MDM Rules/Calculators/A&P                      Final Clinical Impression(s) / ED Diagnoses Final diagnoses:  Intentional drug overdose, initial encounter ([place])  QT prolongation    Rx / DC Orders ED Discharge Orders    None       Hayden Rasmussen, MD 03/29/19 [phone]

## 2019-03-28 NOTE — ED Triage Notes (Signed)
Pt BIBA from home. Pt was called out for AMS, secondary to OD. Pt has rx for lithium, ambien, flexeeril, wellbutrin, pills were around room. Pt has hx of crack use as well. Possible SI attempt, has hx of same in similar manner.  Upon fire arrival, pt was conscious and able to answer with head shakes and nods. 2 mg narcan given by PD for decreased RR.

## 2019-03-29 ENCOUNTER — Inpatient Hospital Stay (HOSPITAL_COMMUNITY): Payer: Medicaid Other

## 2019-03-29 ENCOUNTER — Emergency Department (HOSPITAL_COMMUNITY): Payer: Medicaid Other

## 2019-03-29 ENCOUNTER — Inpatient Hospital Stay (HOSPITAL_COMMUNITY)
Admit: 2019-03-29 | Discharge: 2019-03-29 | Disposition: A | Discharge status: Home / Self Care | Payer: Medicaid Other | Attending: Pulmonary Disease | Admitting: Pulmonary Disease

## 2019-03-29 DIAGNOSIS — F17210 Nicotine dependence, cigarettes, uncomplicated: Secondary | ICD-10-CM | POA: Diagnosis present

## 2019-03-29 DIAGNOSIS — L03113 Cellulitis of right upper limb: Secondary | ICD-10-CM | POA: Diagnosis not present

## 2019-03-29 DIAGNOSIS — T4275XA Adverse effect of unspecified antiepileptic and sedative-hypnotic drugs, initial encounter: Secondary | ICD-10-CM | POA: Diagnosis not present

## 2019-03-29 DIAGNOSIS — F419 Anxiety disorder, unspecified: Secondary | ICD-10-CM | POA: Diagnosis not present

## 2019-03-29 DIAGNOSIS — Z8261 Family history of arthritis: Secondary | ICD-10-CM | POA: Diagnosis not present

## 2019-03-29 DIAGNOSIS — J9601 Acute respiratory failure with hypoxia: Secondary | ICD-10-CM | POA: Diagnosis not present

## 2019-03-29 DIAGNOSIS — R578 Other shock: Secondary | ICD-10-CM | POA: Diagnosis not present

## 2019-03-29 DIAGNOSIS — J80 Acute respiratory distress syndrome: Secondary | ICD-10-CM | POA: Diagnosis not present

## 2019-03-29 DIAGNOSIS — R4182 Altered mental status, unspecified: Secondary | ICD-10-CM | POA: Diagnosis present

## 2019-03-29 DIAGNOSIS — J988 Other specified respiratory disorders: Secondary | ICD-10-CM | POA: Diagnosis not present

## 2019-03-29 DIAGNOSIS — T43292A Poisoning by other antidepressants, intentional self-harm, initial encounter: Secondary | ICD-10-CM | POA: Diagnosis not present

## 2019-03-29 DIAGNOSIS — Z888 Allergy status to other drugs, medicaments and biological substances status: Secondary | ICD-10-CM | POA: Diagnosis not present

## 2019-03-29 DIAGNOSIS — I952 Hypotension due to drugs: Secondary | ICD-10-CM | POA: Diagnosis not present

## 2019-03-29 DIAGNOSIS — Z915 Personal history of self-harm: Secondary | ICD-10-CM | POA: Diagnosis not present

## 2019-03-29 DIAGNOSIS — G894 Chronic pain syndrome: Secondary | ICD-10-CM | POA: Diagnosis not present

## 2019-03-29 DIAGNOSIS — G92 Toxic encephalopathy: Secondary | ICD-10-CM | POA: Diagnosis not present

## 2019-03-29 DIAGNOSIS — T424X2A Poisoning by benzodiazepines, intentional self-harm, initial encounter: Secondary | ICD-10-CM | POA: Diagnosis not present

## 2019-03-29 DIAGNOSIS — F429 Obsessive-compulsive disorder, unspecified: Secondary | ICD-10-CM | POA: Diagnosis present

## 2019-03-29 DIAGNOSIS — F603 Borderline personality disorder: Secondary | ICD-10-CM | POA: Diagnosis not present

## 2019-03-29 DIAGNOSIS — G40901 Epilepsy, unspecified, not intractable, with status epilepticus: Secondary | ICD-10-CM | POA: Diagnosis not present

## 2019-03-29 DIAGNOSIS — Z818 Family history of other mental and behavioral disorders: Secondary | ICD-10-CM | POA: Diagnosis not present

## 2019-03-29 DIAGNOSIS — I10 Essential (primary) hypertension: Secondary | ICD-10-CM | POA: Diagnosis present

## 2019-03-29 DIAGNOSIS — J690 Pneumonitis due to inhalation of food and vomit: Secondary | ICD-10-CM | POA: Diagnosis not present

## 2019-03-29 DIAGNOSIS — B373 Candidiasis of vulva and vagina: Secondary | ICD-10-CM | POA: Diagnosis not present

## 2019-03-29 DIAGNOSIS — G43909 Migraine, unspecified, not intractable, without status migrainosus: Secondary | ICD-10-CM | POA: Diagnosis present

## 2019-03-29 DIAGNOSIS — R17 Unspecified jaundice: Secondary | ICD-10-CM | POA: Diagnosis not present

## 2019-03-29 DIAGNOSIS — Z881 Allergy status to other antibiotic agents status: Secondary | ICD-10-CM | POA: Diagnosis not present

## 2019-03-29 DIAGNOSIS — Z20822 Contact with and (suspected) exposure to covid-19: Secondary | ICD-10-CM | POA: Diagnosis not present

## 2019-03-29 DIAGNOSIS — F319 Bipolar disorder, unspecified: Secondary | ICD-10-CM | POA: Diagnosis not present

## 2019-03-29 DIAGNOSIS — R569 Unspecified convulsions: Secondary | ICD-10-CM | POA: Diagnosis not present

## 2019-03-29 DIAGNOSIS — T43292D Poisoning by other antidepressants, intentional self-harm, subsequent encounter: Secondary | ICD-10-CM | POA: Diagnosis not present

## 2019-03-29 DIAGNOSIS — T424X2D Poisoning by benzodiazepines, intentional self-harm, subsequent encounter: Secondary | ICD-10-CM | POA: Diagnosis not present

## 2019-03-29 LAB — BLOOD GAS, ARTERIAL
Acid-Base Excess: 0.5 mmol/L (ref 0.0–2.0)
Bicarbonate: 25.2 mmol/L (ref 20.0–28.0)
Bicarbonate: 31.8 mmol/L — ABNORMAL HIGH (ref 20.0–28.0)
FIO2: 0.21
FIO2: 80
O2 Saturation: 97.6 %
Patient temperature: 98.6
Patient temperature: 98.6
pCO2 arterial: 43.2 mmHg (ref 32.0–48.0)
pCO2 arterial: 51.9 mmHg — ABNORMAL HIGH (ref 32.0–48.0)
pH, Arterial: 7.384 (ref 7.350–7.450)
pH, Arterial: 7.404 (ref 7.350–7.450)
pO2, Arterial: 116 mmHg — ABNORMAL HIGH (ref 83.0–108.0)
pO2, Arterial: 136 mmHg — ABNORMAL HIGH (ref 83.0–108.0)

## 2019-03-29 LAB — CBC WITH DIFFERENTIAL/PLATELET
Abs Immature Granulocytes: 0.03 10*3/uL (ref 0.00–0.07)
Basophils Absolute: 0 10*3/uL (ref 0.0–0.1)
Basophils Relative: 0 %
Eosinophils Absolute: 0 10*3/uL (ref 0.0–0.5)
Eosinophils Relative: 0 %
HCT: 37.1 % (ref 36.0–46.0)
Hemoglobin: 12.2 g/dL (ref 12.0–15.0)
Immature Granulocytes: 0 %
Lymphocytes Relative: 5 %
Lymphs Abs: 0.5 10*3/uL — ABNORMAL LOW (ref 0.7–4.0)
MCH: 30.3 pg (ref 26.0–34.0)
MCHC: 32.9 g/dL (ref 30.0–36.0)
MCV: 92.3 fL (ref 80.0–100.0)
Monocytes Absolute: 0.5 10*3/uL (ref 0.1–1.0)
Monocytes Relative: 5 %
Neutro Abs: 8.2 10*3/uL — ABNORMAL HIGH (ref 1.7–7.7)
Neutrophils Relative %: 90 %
Platelets: 228 10*3/uL (ref 150–400)
RBC: 4.02 MIL/uL (ref 3.87–5.11)
RDW: 12 % (ref 11.5–15.5)
WBC: 9.1 10*3/uL (ref 4.0–10.5)
nRBC: 0 % (ref 0.0–0.2)

## 2019-03-29 LAB — URINALYSIS, ROUTINE W REFLEX MICROSCOPIC
Bilirubin Urine: NEGATIVE
Glucose, UA: NEGATIVE mg/dL
Hgb urine dipstick: NEGATIVE
Ketones, ur: NEGATIVE mg/dL
Leukocytes,Ua: NEGATIVE
Nitrite: NEGATIVE
Protein, ur: NEGATIVE mg/dL
Specific Gravity, Urine: 1.023 (ref 1.005–1.030)
pH: 7 (ref 5.0–8.0)

## 2019-03-29 LAB — MRSA PCR SCREENING: MRSA by PCR: NEGATIVE

## 2019-03-29 LAB — LACTIC ACID, PLASMA
Lactic Acid, Venous: 4.1 mmol/L (ref 0.5–1.9)
Lactic Acid, Venous: 3.7 mmol/L (ref 0.5–1.9)

## 2019-03-29 LAB — POCT I-STAT 7, (LYTES, BLD GAS, ICA,H+H)
Acid-Base Excess: 7 mmol/L — ABNORMAL HIGH (ref 0.0–2.0)
Bicarbonate: 34 mmol/L — ABNORMAL HIGH (ref 20.0–28.0)
Calcium, Ion: 1.09 mmol/L — ABNORMAL LOW (ref 1.15–1.40)
HCT: 35 % — ABNORMAL LOW (ref 36.0–46.0)
Hemoglobin: 11.9 g/dL — ABNORMAL LOW (ref 12.0–15.0)
O2 Saturation: 100 %
Patient temperature: 103.2
Potassium: 3.6 mmol/L (ref 3.5–5.1)
Sodium: 152 mmol/L — ABNORMAL HIGH (ref 135–145)
TCO2: 36 mmol/L — ABNORMAL HIGH (ref 22–32)
pCO2 arterial: 69.5 mmHg (ref 32.0–48.0)
pH, Arterial: 7.309 — ABNORMAL LOW (ref 7.350–7.450)
pO2, Arterial: 203 mmHg — ABNORMAL HIGH (ref 83.0–108.0)

## 2019-03-29 LAB — BASIC METABOLIC PANEL
Anion gap: 8 (ref 5–15)
BUN: 11 mg/dL (ref 6–20)
CO2: 29 mmol/L (ref 22–32)
Calcium: 8.2 mg/dL — ABNORMAL LOW (ref 8.9–10.3)
Chloride: 105 mmol/L (ref 98–111)
Creatinine, Ser: 0.65 mg/dL (ref 0.44–1.00)
GFR calc Af Amer: >60 mL/min (ref >60)
GFR calc non Af Amer: >60 mL/min (ref >60)
Glucose, Bld: 110 mg/dL — ABNORMAL HIGH (ref 70–99)
Potassium: 3.1 mmol/L — ABNORMAL LOW (ref 3.5–5.1)
Sodium: 142 mmol/L (ref 135–145)

## 2019-03-29 LAB — LITHIUM LEVEL
Lithium Lvl: 0.11 mmol/L — ABNORMAL LOW (ref 0.60–1.20)
Lithium Lvl: <0.06 mmol/L — ABNORMAL LOW (ref 0.60–1.20)

## 2019-03-29 LAB — CREATININE, SERUM
Creatinine, Ser: 0.76 mg/dL (ref 0.44–1.00)
GFR calc Af Amer: >60 mL/min (ref >60)
GFR calc non Af Amer: >60 mL/min (ref >60)

## 2019-03-29 LAB — PHENYTOIN LEVEL, TOTAL: Phenytoin Lvl: 21.3 ug/mL — ABNORMAL HIGH (ref 10.0–20.0)

## 2019-03-29 LAB — RESPIRATORY PANEL BY RT PCR (FLU A&B, COVID)
Influenza A by PCR: NEGATIVE
Influenza B by PCR: NEGATIVE
SARS Coronavirus 2 by RT PCR: NEGATIVE

## 2019-03-29 LAB — AMMONIA: Ammonia: 31 umol/L (ref 9–35)

## 2019-03-29 LAB — TRIGLYCERIDES: Triglycerides: 144 mg/dL (ref <150)

## 2019-03-29 LAB — PROCALCITONIN: Procalcitonin: <0.1 ng/mL

## 2019-03-29 MED ORDER — ENOXAPARIN SODIUM 40 MG/0.4ML ~~LOC~~ SOLN
40.0000 mg | Freq: Every day | SUBCUTANEOUS | Status: DC
  Administered 2019-03-29 – 2019-04-09 (×12): 40 mg via SUBCUTANEOUS
  Filled 2019-03-29 (×12): qty 0.4

## 2019-03-29 MED ORDER — MIDAZOLAM HCL 2 MG/2ML IJ SOLN
INTRAMUSCULAR | Status: AC
  Administered 2019-03-29: 19:00:00 2 mg via INTRAVENOUS
  Filled 2019-03-29: qty 2

## 2019-03-29 MED ORDER — FAT EMULSION PLANT BASED 20 % IV EMUL
75.0000 mL | Freq: Once | INTRAVENOUS | Status: AC
  Administered 2019-03-29: 75 mL via INTRAVENOUS
  Filled 2019-03-29: qty 100

## 2019-03-29 MED ORDER — SODIUM CHLORIDE 0.9 % IV SOLN
250.0000 mL | INTRAVENOUS | Status: DC
  Administered 2019-04-03 – 2019-04-05 (×2): 250 mL via INTRAVENOUS

## 2019-03-29 MED ORDER — FAT EMULSION PLANT BASED 20 % IV EMUL
125.0000 mL | Freq: Once | INTRAVENOUS | Status: AC
  Administered 2019-03-29: 125 mL via INTRAVENOUS
  Filled 2019-03-29: qty 200

## 2019-03-29 MED ORDER — DEXMEDETOMIDINE HCL IN NACL 200 MCG/50ML IV SOLN: 0.4000 ug/kg/h | INTRAVENOUS | Status: DC

## 2019-03-29 MED ORDER — FENTANYL CITRATE (PF) 100 MCG/2ML IJ SOLN: 50.0000 ug | Freq: Once | INTRAMUSCULAR | Status: AC

## 2019-03-29 MED ORDER — MIDAZOLAM HCL 2 MG/2ML IJ SOLN
INTRAMUSCULAR | Status: AC
  Filled 2019-03-29: qty 14

## 2019-03-29 MED ORDER — MIDAZOLAM 50MG/50ML (1MG/ML) PREMIX INFUSION
30.0000 mg/h | INTRAVENOUS | Status: DC
  Filled 2019-03-29: qty 50

## 2019-03-29 MED ORDER — MIDAZOLAM HCL 2 MG/2ML IJ SOLN
2.0000 mg | INTRAMUSCULAR | Status: DC | PRN
  Administered 2019-03-29 – 2019-04-01 (×5): 2 mg via INTRAVENOUS
  Filled 2019-03-29 (×6): qty 2

## 2019-03-29 MED ORDER — NOREPINEPHRINE 4 MG/250ML-% IV SOLN
INTRAVENOUS | Status: AC
  Filled 2019-03-29: qty 250

## 2019-03-29 MED ORDER — CHLORHEXIDINE GLUCONATE CLOTH 2 % EX PADS
6.0000 | MEDICATED_PAD | Freq: Every day | CUTANEOUS | Status: DC
  Administered 2019-03-29 – 2019-04-06 (×6): 6 via TOPICAL

## 2019-03-29 MED ORDER — PHENYLEPHRINE 40 MCG/ML (10ML) SYRINGE FOR IV PUSH (FOR BLOOD PRESSURE SUPPORT)
100.0000 ug | PREFILLED_SYRINGE | Freq: Once | INTRAVENOUS | Status: AC
  Administered 2019-03-29: 100 ug via INTRAVENOUS

## 2019-03-29 MED ORDER — NOREPINEPHRINE 16 MG/250ML-% IV SOLN
0.0000 ug/min | INTRAVENOUS | Status: DC
  Administered 2019-03-29: 15 ug/min via INTRAVENOUS
  Filled 2019-03-29: qty 250

## 2019-03-29 MED ORDER — MIDAZOLAM HCL 2 MG/2ML IJ SOLN: 2.0000 mg | Freq: Once | INTRAMUSCULAR | Status: DC

## 2019-03-29 MED ORDER — DEXMEDETOMIDINE HCL IN NACL 200 MCG/50ML IV SOLN
0.4000 ug/kg/h | INTRAVENOUS | Status: DC
  Administered 2019-03-29: 0.6 ug/kg/h via INTRAVENOUS
  Filled 2019-03-29 (×2): qty 50

## 2019-03-29 MED ORDER — MIDAZOLAM BOLUS VIA INFUSION
15.0000 mg | Freq: Once | INTRAVENOUS | Status: AC
  Administered 2019-03-29: 15 mg via INTRAVENOUS
  Filled 2019-03-29: qty 15

## 2019-03-29 MED ORDER — SODIUM CHLORIDE 0.9 % IV BOLUS (SEPSIS)
1000.0000 mL | Freq: Once | INTRAVENOUS | Status: AC
  Administered 2019-03-29: 1000 mL via INTRAVENOUS

## 2019-03-29 MED ORDER — SODIUM CHLORIDE 0.9 % IV SOLN
2000.0000 mg | INTRAVENOUS | Status: AC
  Administered 2019-03-29: 2000 mg via INTRAVENOUS
  Filled 2019-03-29: qty 20

## 2019-03-29 MED ORDER — PEG 3350-KCL-NA BICARB-NACL 420 G PO SOLR
4000.0000 mL | Freq: Once | ORAL | Status: AC
  Administered 2019-03-29: 4000 mL via ORAL
  Filled 2019-03-29: qty 4000

## 2019-03-29 MED ORDER — LACTATED RINGERS IV BOLUS
1000.0000 mL | Freq: Once | INTRAVENOUS | Status: AC
  Administered 2019-03-29: 1000 mL via INTRAVENOUS

## 2019-03-29 MED ORDER — SODIUM CHLORIDE 0.9 % IV SOLN
15.0000 mg/kg | INTRAVENOUS | Status: AC
  Administered 2019-03-29: 1249.5 mg via INTRAVENOUS
  Filled 2019-03-29: qty 24.99

## 2019-03-29 MED ORDER — MIDAZOLAM HCL 2 MG/2ML IJ SOLN
10.0000 mg | Freq: Once | INTRAMUSCULAR | Status: DC
  Filled 2019-03-29: qty 10

## 2019-03-29 MED ORDER — MIDAZOLAM BOLUS VIA INFUSION
10.0000 mg | Freq: Once | INTRAVENOUS | Status: AC
  Administered 2019-03-29: 10 mg via INTRAVENOUS
  Filled 2019-03-29: qty 10

## 2019-03-29 MED ORDER — FENTANYL CITRATE (PF) 100 MCG/2ML IJ SOLN
INTRAMUSCULAR | Status: AC
  Administered 2019-03-29: 50 ug via INTRAVENOUS
  Filled 2019-03-29: qty 2

## 2019-03-29 MED ORDER — MIDAZOLAM 50MG/50ML (1MG/ML) PREMIX INFUSION
10.0000 mg/h | INTRAVENOUS | Status: DC
  Administered 2019-03-29 (×2): 10 mg/h via INTRAVENOUS
  Filled 2019-03-29 (×2): qty 50

## 2019-03-29 MED ORDER — SODIUM BICARBONATE-DEXTROSE 150-5 MEQ/L-% IV SOLN
150.0000 meq | INTRAVENOUS | Status: DC
  Administered 2019-03-29: 150 meq via INTRAVENOUS
  Filled 2019-03-29 (×3): qty 1000

## 2019-03-29 MED ORDER — PROPOFOL 1000 MG/100ML IV EMUL
50.0000 ug/kg/min | INTRAVENOUS | Status: DC
  Administered 2019-03-29: 30 ug/kg/min via INTRAVENOUS
  Administered 2019-03-30: 50 ug/kg/min via INTRAVENOUS
  Administered 2019-03-30: 40 ug/kg/min via INTRAVENOUS
  Administered 2019-03-30 (×4): 50 ug/kg/min via INTRAVENOUS
  Administered 2019-03-31: 45 ug/kg/min via INTRAVENOUS
  Administered 2019-03-31 (×2): 50 ug/kg/min via INTRAVENOUS
  Administered 2019-03-31: 17:00:00 30 ug/kg/min via INTRAVENOUS
  Administered 2019-04-01: 10 ug/kg/min via INTRAVENOUS
  Administered 2019-04-01: 40 ug/kg/min via INTRAVENOUS
  Filled 2019-03-29 (×2): qty 100
  Filled 2019-03-29: qty 200
  Filled 2019-03-29 (×8): qty 100

## 2019-03-29 MED ORDER — FLUCONAZOLE IN SODIUM CHLORIDE 200-0.9 MG/100ML-% IV SOLN
200.0000 mg | Freq: Once | INTRAVENOUS | Status: AC
  Administered 2019-03-29: 200 mg via INTRAVENOUS
  Filled 2019-03-29: qty 100

## 2019-03-29 MED ORDER — PHENYLEPHRINE HCL-NACL 10-0.9 MG/250ML-% IV SOLN
INTRAVENOUS | Status: AC
  Filled 2019-03-29: qty 250

## 2019-03-29 MED ORDER — CLINDAMYCIN PHOSPHATE 600 MG/50ML IV SOLN
600.0000 mg | Freq: Three times a day (TID) | INTRAVENOUS | Status: DC
  Administered 2019-03-29 – 2019-04-02 (×12): 600 mg via INTRAVENOUS
  Filled 2019-03-29 (×13): qty 50

## 2019-03-29 MED ORDER — NOREPINEPHRINE 4 MG/250ML-% IV SOLN
2.0000 ug/min | INTRAVENOUS | Status: DC
  Administered 2019-03-29: 20 ug/min via INTRAVENOUS

## 2019-03-29 MED ORDER — LEVETIRACETAM IN NACL 1000 MG/100ML IV SOLN
1000.0000 mg | Freq: Once | INTRAVENOUS | Status: AC
  Administered 2019-03-29: 1000 mg via INTRAVENOUS
  Filled 2019-03-29: qty 100

## 2019-03-29 MED ORDER — DEXMEDETOMIDINE HCL IN NACL 400 MCG/100ML IV SOLN: 0.4000 ug/kg/h | INTRAVENOUS | Status: DC

## 2019-03-29 MED ORDER — MAGNESIUM SULFATE 2 GM/50ML IV SOLN
2.0000 g | Freq: Once | INTRAVENOUS | Status: AC
  Administered 2019-03-29: 2 g via INTRAVENOUS
  Filled 2019-03-29: qty 50

## 2019-03-29 MED ORDER — FAT EMULSION PLANT BASED 20 % IV EMUL
250.0000 mL | INTRAVENOUS | Status: DC
  Administered 2019-03-30: 250 mL via INTRAVENOUS
  Filled 2019-03-29: qty 250

## 2019-03-29 MED ORDER — SODIUM CHLORIDE 0.9 % IV SOLN
10.0000 mg/h | INTRAVENOUS | Status: DC
  Filled 2019-03-29: qty 20

## 2019-03-29 MED ORDER — LORAZEPAM 2 MG/ML IJ SOLN
INTRAMUSCULAR | Status: AC
  Administered 2019-03-29: 19:00:00 1 mg via INTRAVENOUS
  Filled 2019-03-29: qty 1

## 2019-03-29 MED ORDER — PEG 3350-KCL-NA BICARB-NACL 420 G PO SOLR
4000.0000 mL | Freq: Once | ORAL | Status: DC
  Filled 2019-03-29: qty 4000

## 2019-03-29 MED ORDER — PROPOFOL 1000 MG/100ML IV EMUL
INTRAVENOUS | Status: AC
  Filled 2019-03-29: qty 100

## 2019-03-29 MED ORDER — SODIUM CHLORIDE 0.9 % IV SOLN
1000.0000 mL | INTRAVENOUS | Status: DC
  Administered 2019-03-29 – 2019-03-30 (×6): 1000 mL via INTRAVENOUS

## 2019-03-29 MED ORDER — LORAZEPAM 2 MG/ML IJ SOLN
1.0000 mg | Freq: Once | INTRAMUSCULAR | Status: AC
  Administered 2019-03-29: 03:00:00 1 mg via INTRAVENOUS
  Filled 2019-03-29: qty 1

## 2019-03-29 MED ORDER — LORAZEPAM 2 MG/ML IJ SOLN: 1.0000 mg | Freq: Once | INTRAMUSCULAR | Status: AC

## 2019-03-29 MED ORDER — LEVETIRACETAM IN NACL 1000 MG/100ML IV SOLN
1000.0000 mg | Freq: Once | INTRAVENOUS | Status: DC
  Administered 2019-03-29: 1000 mg via INTRAVENOUS

## 2019-03-29 MED ORDER — MIDAZOLAM HCL 2 MG/2ML IJ SOLN: 2.0000 mg | Freq: Once | INTRAMUSCULAR | Status: AC

## 2019-03-29 MED ORDER — MIDAZOLAM HCL 2 MG/2ML IJ SOLN
10.0000 mg | Freq: Once | INTRAMUSCULAR | Status: DC
  Administered 2019-03-29: 10 mg via INTRAVENOUS
  Filled 2019-03-29: qty 10

## 2019-03-29 MED ORDER — MIDAZOLAM HCL 2 MG/2ML IJ SOLN: 10.0000 mg | Freq: Once | INTRAMUSCULAR | Status: DC

## 2019-03-29 MED ORDER — MIDAZOLAM BOLUS VIA INFUSION
10.0000 mg | INTRAVENOUS | Status: DC
  Administered 2019-03-29: 10 mg via INTRAVENOUS
  Filled 2019-03-29: qty 10

## 2019-03-29 NOTE — Progress Notes (Signed)
Pharmacy Antibiotic Note  Carmen Daniels is a 34 y.o. female admitted on 03/28/2019 with drug overdose.  Pharmacy has been consulted for fluconzole dosing for vulvovaginal yeast infection.    Plan: Unable to take PO currently Fluconazole 200 mg IV x 1 dose Pharmacy to sign off   Height: 6' (182.9 cm) Weight: 183 lb 10.3 oz (83.3 kg) IBW/kg (Calculated) : 73.1  Temp (24hrs), Avg:97.5 F (36.4 C), Min:97.5 F (36.4 C), Max:97.5 F (36.4 C)  Recent Labs  Lab 03/28/19 2125 03/29/19 0257 03/29/19 1206  WBC 21.0*  --   --   CREATININE 0.81 0.76  --   LATICACIDVEN  --   --  4.1*    Estimated Creatinine Clearance: 114.3 mL/min (by C-G formula based on SCr of 0.76 mg/dL).    Allergies  Allergen Reactions  . Keflex [Cephalexin] Anaphylaxis  . Flagyl [Metronidazole] Diarrhea    Thank you for allowing pharmacy to be a part of this patient's care.  Eudelia Bunch, Pharm.D [phone] 03/29/2019 2:49 PM

## 2019-03-29 NOTE — Progress Notes (Signed)
EEG complete - results pending 

## 2019-03-29 NOTE — Progress Notes (Signed)
vLTM started  Neurology notified   Event button tested 

## 2019-03-29 NOTE — Progress Notes (Signed)
On initial assessment to the ICU, patient has old scarring from cutting wrists on both arms and legs. Patient also has new tattoos. One large tattoo on the right hand with redness surrounding it. This was marked on the patients skin, as the NP thinks she has cellulitis. There is also two new tattoos on two of her fingers on the left hand. Patient is still not alert, pupils are 8 and are not reactive to light. Patient will respond to pain and twitches her arms at times. EEG ongoing in room at this time.

## 2019-03-29 NOTE — ED Notes (Addendum)
Carmen Daniels (sister) [phone]

## 2019-03-29 NOTE — Progress Notes (Addendum)
Patient had 3 seizures  2 seizures prior to being intubated and 1 immediately afterwards  Intubated with a 7.5 endotracheal tube uneventfully  Dried caked secretions start the back of her throat , airway was very anterior   Updated patient's sister whose number is on the record-physician about the events  Left a message for mother to call back-mobile phone went to voicemail   Patient will be transferred to [facility] [address]. for neuro monitoring

## 2019-03-29 NOTE — Progress Notes (Addendum)
Patients mother reports that patient had her IUD removed 2-3  Weeks ago in the ED due to bleeding and discomfort. Mother also reports the patient had an abortion October/november. Patient is not currently on birth control.

## 2019-03-29 NOTE — Progress Notes (Signed)
eLink Physician-Brief Progress Note Patient Name: Carmen Daniels DOB: [birth year] MRN: [id number]   Date of Service  03/29/2019  HPI/Events of Note  Seizures - Patient has prolonged QTc, therefore, will avoid Propofol and Keppra.   eICU Interventions  Will order: 1. Fosphenytoin 15 mg/kg loading dose. 2. Versed 2 mg IV Q 1 hour PRN seizures.     Intervention Category Major Interventions: Seizures - evaluation and management  Arsal Tappan Cornelia Copa 03/29/2019, 8:19 PM

## 2019-03-29 NOTE — Progress Notes (Signed)
PULMONARY / CRITICAL CARE MEDICINE   NAME:  Carmen Daniels, MRN:  [id number], DOB:  1985/02/25, LOS: 0 ADMISSION DATE:  03/28/2019, CONSULTATION DATE:  03/29/2019  REFERRING MD:  Leonette Monarch, ED, CHIEF COMPLAINT: Altered mental status  BRIEF HISTORY:    34 year old woman with a history of bipolar disorder brought in by ambulance for altered mental status.  Possible overdose with numerous pills found around the room.  Mental status worsened in the ER with possible seizures, given Ativan UDS positive for benzodiazepines   HISTORY OF PRESENT ILLNESS   History is obtained after speaking to EDP, reviewing chart and speaking to mom Manuela Schwartz She was involuntarily committed earlier in the spring, has previous history of drug overdose and self-mutilation.  She apparently has an online friend to whom she said last week that she would be attempting suicide after her mother's birthday.  Mom called ambulance today, found with altered mental status, mom felt lithium bottle was empty other pills Ambien, Flexeril and Wellbutrin found around the room.  She may have a history of crack use.  On EMS arrival she was conscious, given 2 mg of Narcan brought to the ED, where her mental status got worse and became increasingly obtunded.  Head CT was negative.  Per nurse and EDP she had intermittent whole body tremors/shaking, was given Ativan just before my exam UDS positive for benzodiazepines   Since admission patient has had multiple seizures requiring intubation and progressed to status epilepticus. Transferred to [facility] where patient noted to still be in status. Thus has been bolused with versed x2 and has also been started on propofol.    SIGNIFICANT PAST MEDICAL HISTORY   Bipolar, anxiety, chronic pain syndrome, personality disorder, previous OD, was involuntarily  committed in the spring  SIGNIFICANT EVENTS:  Intubated on eve of 03/28/18 LTM notable for SE Bolused fosphenytoin for concern for prolonged QTC Versed  given as well  STUDIES:   Head CT 2/3 neg CULTURES:  Pending  ANTIBIOTICS:  Was on clindamycin  LINES/TUBES:  Central line placed on 2/4  CONSULTANTS:  Neurology consulted    SUBJECTIVE:    CONSTITUTIONAL: BP 93/72   Pulse 78   Temp 99.3 F (37.4 C) (Oral)   Resp 20   Ht 5\' 10"  (1.778 m) Comment: measured pt x3  Wt 83.3 kg   LMP  (LMP Unknown)   SpO2 100%   Breastfeeding No   BMI 26.35 kg/m   I/O last 3 completed shifts: In: 3198.7 [I.V.:3027.4; IV Piggyback:171.3] Out: 3200 [Urine:3200]     Vent Mode: PRVC FiO2 (%):  [80 %-100 %] 80 % Set Rate:  [12 bmp-22 bmp] 12 bmp Vt Set:  [540 mL-580 mL] 540 mL PEEP:  [5 cmH20] 5 cmH20 Plateau Pressure:  [13 cmH20-15 cmH20] 13 cmH20  PHYSICAL EXAM: General: Young woman, unresponsive, supine in bed Neuro: Unresponsive. Will intermittently have clonic jerks of upper extremities and head downgoing, no rigidity or hyperreflexia HEENT: No JVD, no pallor or icterus Cardiovascular: S1-S2 normal Lungs: Shallow respirations, decreased breath sounds bilateral Abdomen: Soft nontender Musculoskeletal: Normal bulk and tone, no rigidity Skin: Numerous tattoos  Chest x-ray personally reviewed-no infiltrates  RESOLVED PROBLEM LIST   ASSESSMENT AND PLAN   This is a 34 year old with history of suicide attempt who presents for overdose. Found to have lithium, wellbutrin, ambien and flexeril about the room. Patient was noted to progress from intermittent seizures to SE.   Status epilepticus-Noted at [facility]. Continued here despite 2 boluses  of 20 mg versed and pervious fosphenytoin load. The presumptove diagnosis is wellbutrin overdose given that she has a prolonged QTC (653) QRS (136) and status epilepticus.  -Intralipid infusion for now -Whole bowel irrigation with Go lytely -Propofol infusion and Keppra for seizures -CT head after. May warrant LP as well given seizures and limited exam -Less likely lithium given that  she has normal levels. However will repeat in AM. Also generous fluid hydration in case she has taken extended release lithium and levels have not peaked yet.  -EKG hourly -If cardiovascular collapse worsens (ie prolonging QTC and worsneing pressor requirements) will call ECMO team here for consideration.  -Echo in AM -Repeat tylenol level before given anything for fever    Leukocytosis -no evidence of aspiration or UTI, hold off antibiotics and follow   SUMMARY OF TODAY'S PLAN:  Monitor for signs of cardiovascular collapse and consider ecmo if needed,   Best Practice / Goals of Care / Disposition.   DVT PROPHYLAXIS:lovenox SUP:NA NUTRITION:npo MOBILITY:BR GOALS OF CARE:NA FAMILY DISCUSSIONS: None available DISPOSITION ICU  LABS  Glucose Recent Labs  Lab 03/28/19 2126  GLUCAP 118*    BMET Recent Labs  Lab 03/28/19 2125 03/29/19 0257 03/29/19 1816 03/29/19 2151  NA 140  --  142 152*  K 4.1  --  3.1* 3.6  CL 102  --  105  --   CO2 24  --  29  --   BUN 15  --  11  --   CREATININE 0.81 0.76 0.65  --   GLUCOSE 111*  --  110*  --     Liver Enzymes Recent Labs  Lab 03/28/19 2125  AST 25  ALT 15  ALKPHOS 55  BILITOT 1.1  ALBUMIN 4.1    Electrolytes Recent Labs  Lab 03/28/19 2125 03/29/19 1816  CALCIUM 9.8 8.2*    CBC Recent Labs  Lab 03/28/19 2125 03/29/19 1816 03/29/19 2151  WBC 21.0* 9.1  --   HGB 14.3 12.2 11.9*  HCT 44.6 37.1 35.0*  PLT 305 228  --     ABG Recent Labs  Lab 03/29/19 0500 03/29/19 2003 03/29/19 2151  PHART 7.384 7.404 7.309*  PCO2ART 43.2 51.9* 69.5*  PO2ART 116* 136* 203.0*    Coag's No results for input(s): APTT, INR in the last 168 hours.  Sepsis Markers Recent Labs  Lab 03/29/19 1206 03/29/19 1816  LATICACIDVEN 4.1* 3.7*  PROCALCITON <0.10  --     Cardiac Enzymes No results for input(s): TROPONINI, PROBNP in the last 168 hours.  PAST MEDICAL HISTORY :   She  has a past medical history of Acute  rhinosinusitis (07/03/2018), Allergic rhinitis, Anxiety, Asthma, Back pain, Bipolar disorder ([place]), Borderline personality disorder ([place]), Chronic pain, Chronic pain syndrome, Depression, Hallucinations, Migraines, Nasal polyps, Nausea, Neck pain, OCD (obsessive compulsive disorder), OCD (obsessive compulsive disorder), and Ovarian cyst.  PAST SURGICAL HISTORY:  She  has a past surgical history that includes Nasal sinus surgery and Wisdom tooth extraction.  Allergies  Allergen Reactions  . Keflex [Cephalexin] Anaphylaxis  . Flagyl [Metronidazole] Diarrhea    No current facility-administered medications on file prior to encounter.   Current Outpatient Medications on File Prior to Encounter  Medication Sig  . albuterol (VENTOLIN HFA) 108 (90 Base) MCG/ACT inhaler Inhale 1-2 puffs into the lungs every 4 (four) hours as needed for wheezing or shortness of breath.  . beclomethasone (QVAR) 40 MCG/ACT inhaler Inhale 1 puff into the lungs 2 (two) times daily as needed (  asthma).   . BOTOX 100 units SOLR injection MD TO INJECT 155 UNITS INTRAMUSCULARLY INTO HEAD AND NECK MUSCLES EVERY 3 MONTHS BY PROVIDER (Patient taking differently: Inject 155 Units into the muscle every 3 (three) months. Head and neck muscles for migraine prevention)  . buPROPion (WELLBUTRIN XL) 300 MG 24 hr tablet Take 300 mg by mouth daily.  . clonazePAM (KLONOPIN) 1 MG tablet Take 1 mg by mouth 4 (four) times daily.  . cyclobenzaprine (FLEXERIL) 10 MG tablet Take 10 mg by mouth 3 (three) times daily as needed for muscle spasms.   . Fremanezumab-vfrm (AJOVY) 225 MG/1.5ML SOAJ Inject 225 mg into the skin every 30 (thirty) days.  Marland Kitchen gabapentin (NEURONTIN) 100 MG capsule Take 100 mg by mouth 3 (three) times daily.  Marland Kitchen HYDROcodone-acetaminophen (NORCO) 10-325 MG tablet Take 1 tablet by mouth 2 (two) times daily as needed for moderate pain.  Marland Kitchen lithium carbonate (ESKALITH) 450 MG CR tablet Take 900 mg by mouth at bedtime.  . promethazine  (PHENERGAN) 25 MG tablet Take 25 mg by mouth 3 (three) times daily as needed for nausea.   . propranolol (INDERAL) 10 MG tablet Take 10 mg by mouth 3 (three) times daily as needed (Anxiety).   . SUMAtriptan (IMITREX) 100 MG tablet TAKE 1 TABLET AT ONSET MAY REPEAT IN 2 HOURS IF NEEDED MAY DOSE 2 PER DAY OR 3 (Patient taking differently: Take 100 mg by mouth every 2 (two) hours as needed for migraine. MAY DOSE 2 PER DAY OR 3)  . tranexamic acid (LYSTEDA) 650 MG TABS tablet Take 2 tablets (1,300 mg total) by mouth 3 (three) times daily. Take during menses for a maximum of five days  . zolpidem (AMBIEN) 10 MG tablet Take 10 mg by mouth at bedtime.   Marland Kitchen lithium carbonate 300 MG capsule Take 600 mg by mouth daily.    FAMILY HISTORY:   Her family history includes Arthritis in her maternal grandfather and maternal grandmother; Depression in her paternal grandmother; Healthy in her father and mother.  SOCIAL HISTORY:  She  reports that she has been smoking cigarettes. She has been smoking about 0.50 packs per day. She has never used smokeless tobacco. She reports current alcohol use. She reports that she does not use drugs.  REVIEW OF SYSTEMS:    Unable to obtain since unresponsive  The patient is critically ill with multiple organ systems failure and requires high complexity decision making for assessment and support, frequent evaluation and titration of therapies, application of advanced monitoring technologies and extensive interpretation of multiple databases. Critical Care Time devoted to patient care services described in this note independent of APP/resident  time is 45 minutes.      03/29/2019

## 2019-03-29 NOTE — Procedures (Signed)
Central Venous Catheter Insertion Procedure Note Carmen Daniels [id number] 07/02/1985  Procedure: Insertion of Central Venous Catheter Indications: Assessment of intravascular volume, Drug and/or fluid administration and Frequent blood sampling  Procedure Details Consent: Unable to obtain consent because of emergent medical necessity. Time Out: Verified patient identification, verified procedure, site/side was marked, verified correct patient position, special equipment/implants available, medications/allergies/relevent history reviewed, required imaging and test results available.  Performed  Maximum sterile technique was used including antiseptics, cap, gloves, gown, hand hygiene, mask and sheet. Skin prep: Chlorhexidine; local anesthetic administered A antimicrobial bonded/coated triple lumen catheter was placed in the high right internal jugular vein using the Seldinger technique to 19cm.  Line sutured, biopatch placed, and sterile dressing applied.   Evaluation Blood flow good Complications: No apparent complications Patient did tolerate procedure well. Chest X-ray ordered to verify placement.  CXR: pending.  Procedure performed with ultrasound guidance for real time vessel cannulation.      Kennieth Rad, MSN, AGACNP-BC [facility] 03/29/2019, 10:20 PM

## 2019-03-29 NOTE — Progress Notes (Signed)
PCCM F/u:  Pt arrived ICU. Protecting airway.  Moving spontaneously, withdraws from tactile stimuli. Pupils dilated, minimally reactive.   R hand with relatively new tattoo, tegaderm over same. Noted erythema and swelling to hand extending beyond tattoo. Afebrile. She does have leukocytosis which may be reactionary.  ABG WNL.  Will check procal, blood cx, lactic acid. Clindamycin for probable R hand cellulitis Close monitoring for respiratory decompensation and need for intubation.  Qtc monitoring.   Place additional PIV.   I personally spent 32 minutes providing critical care services including personally reviewing test results, discussing care with nursing staff/other physicians and completing orders pertaining to this patient.  Time was exclusive to the patient and does not include time spent teaching or in procedures.  Voice recognition software was used in the production of this record.  Errors in interpretation may have been inadvertently missed during review.  Francine Graven, MSN, AGACNP  [facility]

## 2019-03-29 NOTE — Progress Notes (Signed)
CRITICAL VALUE ALERT  Critical Value: Lactic Acid 4.1   Date & Time Notied:  03/29/19, 1248  Provider Notified: Ander Slade, MD   Orders Received/Actions taken: Awaiting further orders

## 2019-03-29 NOTE — Procedures (Signed)
Intubation Procedure Note Carmen Daniels [id number] [birth year]  Procedure: Intubation Indications: Respiratory failure  Patient received 1 mg Ativan, 50 mcg of fentanyl for sedation  Procedure Details Consent: Unable to obtain consent because of altered level of consciousness. Time Out: Verified patient identification, verified procedure, site/side was marked, verified correct patient position, special equipment/implants available, medications/allergies/relevent history reviewed, required imaging and test results available.  Performed  Maximum sterile technique was used including gloves, gown, hand hygiene and mask.  MAC and 4    Evaluation Hemodynamic Status: BP stable throughout; O2 sats: stable throughout Patient's Current Condition: stable Complications: No apparent complications Patient did tolerate procedure well. Chest X-ray ordered to verify placement.  CXR: pending.   Carmen Daniels A Kristoffer Bala 03/29/2019

## 2019-03-29 NOTE — ED Notes (Signed)
Manuela Schwartz (Mother) [id number]

## 2019-03-29 NOTE — Progress Notes (Signed)
Spoke with patient mother patient in [facility] with possible overmedication and awaiting icu bed now, mother Arlie Solomons to call dr Rip Harbour scheduler to cancel surgery.

## 2019-03-29 NOTE — Procedures (Signed)
Patient Name: Carmen Daniels  MRN: [id number]  Epilepsy Attending: Lora Havens  Referring Physician/Provider: Dr Kara Mead Date: 03/29/2019  Duration: 25.04 mins  Patient history: 34 yo F who presented with ams. Likely suicidal overdose with dilated pupils and intermittent myoclonus-like activity.  EEG to evaluate for seizures.  Level of alertness: Lethargic  AEDs during EEG study: Lorazepam  Technical aspects: This EEG study was done with scalp electrodes positioned according to the 10-20 International system of electrode placement. Electrical activity was acquired at a sampling rate of 500Hz  and reviewed with a high frequency filter of 70Hz  and a low frequency filter of 1Hz . EEG data were recorded continuously and digitally stored.   Description: No clear posterior dominant rhythm was seen.  EEG showed continuous generalized mixed frequencies including 3 to 6 Hz theta-delta slowing as well as 8 to 9 Hz generalized alpha activity and intermittent 13 to 15 Hz generalized beta activity, maximal frontocentral. EEG was reactive to noxious stimulation.  Hyperventilation photic summation were not performed due to AMS.  Abnormality -Continuous slow, generalized  IMPRESSION: This study is suggestive of severe diffuse encephalopathy, nonspecific to etiology. No seizures or epileptiform discharges were seen throughout the recording.    Carmen Daniels [name]

## 2019-03-29 NOTE — Progress Notes (Signed)
RT obtained post intubation ABG on pt with the following results and changes. CCM MD Orpah Melter aware of critical PCO2. RR increased from 12 to 16 per MD. RT will continue to monitor.   Results for Carmen Daniels, Carmen Daniels (MRN [id number]) as of 03/29/2019 22:18  Ref. Range 03/29/2019 21:51  pH, Arterial Latest Ref Range: 7.350 - 7.450  7.309 (L)  pCO2 arterial Latest Ref Range: 32.0 - 48.0 mmHg 69.5 (HH)  pO2, Arterial Latest Ref Range: 83.0 - 108.0 mmHg 203.0 (H)  TCO2 Latest Ref Range: 22 - 32 mmol/L 36 (H)  Acid-Base Excess Latest Ref Range: 0.0 - 2.0 mmol/L 7.0 (H)  Bicarbonate Latest Ref Range: 20.0 - 28.0 mmol/L 34.0 (H)  O2 Saturation Latest Units: % 100.0  Patient temperature Unknown 103.2 F  Collection site Unknown RADIAL, ALLEN'S TEST ACCEPTABLE

## 2019-03-29 NOTE — Procedures (Signed)
Arterial Catheter Insertion Procedure Note SHIVAUN AVRIL [id number] [birth year]  Procedure: Insertion of Arterial Catheter  Indications: Blood pressure monitoring and Frequent blood sampling  Procedure Details Consent: Unable to obtain consent because of emergent medical necessity. Time Out: Verified patient identification, verified procedure, site/side was marked, verified correct patient position, special equipment/implants available, medications/allergies/relevent history reviewed, required imaging and test results available.  Performed  Maximum sterile technique was used including antiseptics, cap, gloves, gown, hand hygiene, mask and sheet. Skin prep: Chlorhexidine; local anesthetic administered 20 gauge catheter was inserted into left radial artery using the Seldinger technique. ULTRASOUND GUIDANCE USED: YES Evaluation Blood flow good; BP tracing good. Complications: No apparent complications.    Kennieth Rad, MSN, AGACNP-BC [facility] 03/29/2019, 11:31 PM

## 2019-03-29 NOTE — ED Provider Notes (Signed)
I assumed care of this patient from Dr. Melina Copa.  Please see their note for further details of Hx, PE.  Briefly patient is a 34 y.o. female who presented after reported suicide attempt.    1:35 AM Repeat EKG with prolonging QTC at 548. Exam not consistent with serotonin syndrome (no hyperthermia, tachycardia, hyperreflexia, or clonus).   EKG Interpretation  Date/Time:  Thursday March 29 2019 01:31:57 EST Ventricular Rate:  79 PR Interval:    QRS Duration: 122 QT Interval:  478 QTC Calculation: 548 R Axis:   43 Text Interpretation: Sinus rhythm Probable left ventricular hypertrophy Nonspecific T abnrm, anterolateral leads ST elev, probable normal early repol pattern Prolonged QT interval Confirmed by Addison Lank [phone]) on 03/29/2019 5:17:04 AM       1:51 AM Called lab to follow-up on lithium level & had not been received. They will be running it now.  Still not responding. With brief intermittent seizure like activity. No dysrhythmias noted on tele. Renal function intact on labs. Still maintaining airway. Ativan for possible seizures.   3:12 AM QTc now 574. Will call CC for admission. Lithium level had to be redrawn.  EKG Interpretation  Date/Time:  Thursday March 29 2019 03:09:03 EST Ventricular Rate:  73 PR Interval:    QRS Duration: 126 QT Interval:  520 QTC Calculation: 574 R Axis:   40 Text Interpretation: Sinus rhythm Probable left ventricular hypertrophy Nonspecific T abnrm, anterolateral leads Prolonged QT interval Confirmed by Addison Lank [phone]) on 03/29/2019 5:17:37 AM       4:30 AM CC evaluated patient at bedside. Plan to admit. Would like to follow up lithium level and ABG. If lithium high or if hypercarbic, they request patient be intubated. I will follow this up while she is in the ED.  4:52 AM Lithium low.  5:16 AM ABG WNL.   .Critical Care Performed by: Fatima Blank, MD Authorized by: Fatima Blank, MD     CRITICAL  CARE Performed by: Grayce Sessions [place] Luppino Total critical care time: 75 minutes Critical care time was exclusive of separately billable procedures and treating other patients. Critical care was necessary to treat or prevent imminent or life-threatening deterioration. Critical care was time spent personally by me on the following activities: development of treatment plan with patient and/or surrogate as well as nursing, discussions with consultants, evaluation of patient's response to treatment, examination of patient, obtaining history from patient or surrogate, ordering and performing treatments and interventions, ordering and review of laboratory studies, ordering and review of radiographic studies, pulse oximetry and re-evaluation of patient's condition.     Fatima Blank, MD 03/29/19 [phone]

## 2019-03-29 NOTE — H&P (Addendum)
PULMONARY / CRITICAL CARE MEDICINE   NAME:  Carmen Daniels, MRN:  [id number], DOB:  1985/10/26, LOS: 0 ADMISSION DATE:  03/28/2019, CONSULTATION DATE:  03/29/2019  REFERRING MD:  Leonette Monarch, ED, CHIEF COMPLAINT: Altered mental status  BRIEF HISTORY:    34 year old woman with a history of bipolar disorder brought in by ambulance for altered mental status.  Possible overdose with numerous pills found around the room.  Mental status worsened in the ER with possible seizures, given Ativan UDS positive for benzodiazepines   HISTORY OF PRESENT ILLNESS   History is obtained after speaking to EDP, reviewing chart and speaking to mom Manuela Schwartz She was involuntarily committed earlier in the spring, has previous history of drug overdose and self-mutilation.  She apparently has an online friend to whom she said last week that she would be attempting suicide after her mother's birthday.  Mom called ambulance today, found with altered mental status, mom felt lithium bottle was empty other pills Ambien, Flexeril and Wellbutrin found around the room.  She may have a history of crack use.  On EMS arrival she was conscious, given 2 mg of Narcan brought to the ED, where her mental status got worse and became increasingly obtunded.  Head CT was negative.  Per nurse and EDP she had intermittent whole body tremors/shaking, was given Ativan just before my exam UDS positive for benzodiazepines   SIGNIFICANT PAST MEDICAL HISTORY   Bipolar, anxiety, chronic pain syndrome, personality disorder, previous OD, was involuntarily  committed in the spring  SIGNIFICANT EVENTS:   STUDIES:   Head CT 2/3 neg CULTURES:    ANTIBIOTICS:    LINES/TUBES:    CONSULTANTS:   SUBJECTIVE:    CONSTITUTIONAL: BP 99/72   Pulse 71   Temp (!) 97.5 F (36.4 C) (Oral)   Resp 20   LMP  (LMP Unknown)   SpO2 99%   No intake/output data recorded.        PHYSICAL EXAM: General: Young woman, unresponsive, supine in bed Neuro:  Unresponsive, Ativan just given, pupils fully dilated not reactive to light, doll's eye present, GCS 3, shallow respirations, plantars downgoing, no rigidity or hyperreflexia HEENT: No JVD, no pallor or icterus Cardiovascular: S1-S2 normal Lungs: Shallow respirations, decreased breath sounds bilateral Abdomen: Soft nontender Musculoskeletal: Normal bulk and tone, no rigidity Skin: Numerous tattoos  Chest x-ray personally reviewed-no infiltrates  RESOLVED PROBLEM LIST   ASSESSMENT AND PLAN   Appears to be suicidal overdose with poor mental status and dilated pupils and intermittent myoclonus-like activity Prolonged QT interval -this could be related to lithium or Flexeril overdose Other home medication such as Ambien, gabapentin and bupropion could also have similar effects, hence difficult to tease out clinically. She has just received Ativan so neuro exam is not helpful except for dilated pupils, she does not have any other evidence of serotonin syndrome such as hyperthermia hypertension (bupropion).  Head CT negative is reassuring Lithium level resulted as 0.1.  UDS positive for benzodiazepines  She will be admitted to ICU for close monitoring while awaiting lithium levels Acute metabolic encephalopathy -monitor, obtain EEG, avoid further doses of Ativan unless obvious seizures Agree with aggressive hydration and IV bicarbonate Follow Qtc q4h  Obtain ABG, if hypercarbic or remains obtunded then will need intubation for airway protection  Leukocytosis -no evidence of aspiration or UTI, hold off antibiotics and follow   SUMMARY OF TODAY'S PLAN:  Monitor closely and may need intubation for airway protection  Best Practice / Goals of Care /  Disposition.   DVT PROPHYLAXIS:lovenox SUP:NA NUTRITION:npo MOBILITY:BR GOALS OF CARE:NA FAMILY DISCUSSIONS: None available DISPOSITION ICU  LABS  Glucose Recent Labs  Lab 03/28/19 2126  GLUCAP 118*    BMET Recent Labs  Lab  03/28/19 2125  NA 140  K 4.1  CL 102  CO2 24  BUN 15  CREATININE 0.81  GLUCOSE 111*    Liver Enzymes Recent Labs  Lab 03/28/19 2125  AST 25  ALT 15  ALKPHOS 55  BILITOT 1.1  ALBUMIN 4.1    Electrolytes Recent Labs  Lab 03/28/19 2125  CALCIUM 9.8    CBC Recent Labs  Lab 03/28/19 2125  WBC 21.0*  HGB 14.3  HCT 44.6  PLT 305    ABG No results for input(s): PHART, PCO2ART, PO2ART in the last 168 hours.  Coag's No results for input(s): APTT, INR in the last 168 hours.  Sepsis Markers No results for input(s): LATICACIDVEN, PROCALCITON, O2SATVEN in the last 168 hours.  Cardiac Enzymes No results for input(s): TROPONINI, PROBNP in the last 168 hours.  PAST MEDICAL HISTORY :   She  has a past medical history of Acute rhinosinusitis (07/03/2018), Allergic rhinitis, Anxiety, Asthma, Back pain, Bipolar disorder ([place]), Borderline personality disorder ([place]), Chronic pain, Chronic pain syndrome, Depression, Hallucinations, Migraines, Nasal polyps, Nausea, Neck pain, OCD (obsessive compulsive disorder), OCD (obsessive compulsive disorder), and Ovarian cyst.  PAST SURGICAL HISTORY:  She  has a past surgical history that includes Nasal sinus surgery and Wisdom tooth extraction.  Allergies  Allergen Reactions  . Keflex [Cephalexin] Anaphylaxis  . Flagyl [Metronidazole] Diarrhea    No current facility-administered medications on file prior to encounter.   Current Outpatient Medications on File Prior to Encounter  Medication Sig  . albuterol (VENTOLIN HFA) 108 (90 Base) MCG/ACT inhaler Inhale 1-2 puffs into the lungs every 4 (four) hours as needed for wheezing or shortness of breath.  . beclomethasone (QVAR) 40 MCG/ACT inhaler Inhale 1 puff into the lungs 2 (two) times daily as needed (asthma).   . BOTOX 100 units SOLR injection MD TO INJECT 155 UNITS INTRAMUSCULARLY INTO HEAD AND NECK MUSCLES EVERY 3 MONTHS BY PROVIDER (Patient taking differently: Inject 155 Units into  the muscle every 3 (three) months. Head and neck muscles for migraine prevention)  . buPROPion (WELLBUTRIN XL) 300 MG 24 hr tablet Take 300 mg by mouth daily.  . clonazePAM (KLONOPIN) 1 MG tablet Take 1 mg by mouth 4 (four) times daily.  . cyclobenzaprine (FLEXERIL) 10 MG tablet Take 10 mg by mouth 3 (three) times daily as needed for muscle spasms.   . Fremanezumab-vfrm (AJOVY) 225 MG/1.5ML SOAJ Inject 225 mg into the skin every 30 (thirty) days.  Marland Kitchen gabapentin (NEURONTIN) 100 MG capsule Take 100 mg by mouth 3 (three) times daily.  Marland Kitchen HYDROcodone-acetaminophen (NORCO) 10-325 MG tablet Take 1 tablet by mouth 2 (two) times daily as needed for moderate pain.  Marland Kitchen lithium carbonate (ESKALITH) 450 MG CR tablet Take 900 mg by mouth at bedtime.  . promethazine (PHENERGAN) 25 MG tablet Take 25 mg by mouth 3 (three) times daily as needed for nausea.   . propranolol (INDERAL) 10 MG tablet Take 10 mg by mouth 3 (three) times daily as needed (Anxiety).   . SUMAtriptan (IMITREX) 100 MG tablet TAKE 1 TABLET AT ONSET MAY REPEAT IN 2 HOURS IF NEEDED MAY DOSE 2 PER DAY OR 3 (Patient taking differently: Take 100 mg by mouth every 2 (two) hours as needed for migraine. MAY DOSE 2  PER DAY OR 3)  . tranexamic acid (LYSTEDA) 650 MG TABS tablet Take 2 tablets (1,300 mg total) by mouth 3 (three) times daily. Take during menses for a maximum of five days  . zolpidem (AMBIEN) 10 MG tablet Take 10 mg by mouth at bedtime.   Marland Kitchen lithium carbonate 300 MG capsule Take 600 mg by mouth daily.    FAMILY HISTORY:   Her family history includes Arthritis in her maternal grandfather and maternal grandmother; Depression in her paternal grandmother; Healthy in her father and mother.  SOCIAL HISTORY:  She  reports that she has been smoking cigarettes. She has been smoking about 0.50 packs per day. She has never used smokeless tobacco. She reports current alcohol use. She reports that she does not use drugs.  REVIEW OF SYSTEMS:    Unable to  obtain since unresponsive  The patient is critically ill with multiple organ systems failure and requires high complexity decision making for assessment and support, frequent evaluation and titration of therapies, application of advanced monitoring technologies and extensive interpretation of multiple databases. Critical Care Time devoted to patient care services described in this note independent of APP/resident  time is 45 minutes.    Kara Mead MD. [facility]. [facility]  If no response to pager , please call [phone]   03/29/2019

## 2019-03-29 NOTE — Procedures (Signed)
Intubation Procedure Note Carmen Daniels [id number] 06/13/1985  Procedure: Intubation Indications: Airway protection and maintenance  Procedure Details Consent: Risks of procedure as well as the alternatives and risks of each were explained to the (patient/caregiver).  Consent for procedure obtained. Time Out: Verified patient identification, verified procedure, site/side was marked, verified correct patient position, special equipment/implants available, medications/allergies/relevent history reviewed, required imaging and test results available.  Performed  Maximum sterile technique was used including gloves, gown and hand hygiene.  MAC and 4    Evaluation Hemodynamic Status: BP stable throughout; O2 sats: stable throughout Patient's Current Condition: stable Complications: No apparent complications Patient did tolerate procedure well. Chest X-ray ordered to verify placement.  CXR: pending.   Carmen Daniels 03/29/2019

## 2019-03-29 NOTE — Progress Notes (Signed)
Updated patient's sister about current condition

## 2019-03-29 NOTE — TOC Progression Note (Addendum)
Transition of Care [facility]) - Progression Note    Patient Details  Name: Carmen Daniels MRN: [id number] Date of Birth: 10-Dec-1985  Transition of Care [facility]) CM/SW Contact  Purcell Mouton, RN Phone Number: 03/29/2019, 11:11 AM  Clinical Narrative:     TOC will continue to follow pt for discharge plan. Pt lives with her mother.        Expected Discharge Plan and Services                                                 Social Determinants of Health (SDOH) Interventions    Readmission Risk Interventions No flowsheet data found.

## 2019-03-29 NOTE — Progress Notes (Signed)
ABG drawn on room air. Sample sent to lab for analysis.

## 2019-03-29 NOTE — Consult Note (Signed)
Neurology Consultation Reason for Consult: Status Epilepticus Referring Physician: Levada Schilling  CC: Status epilepticus  History is obtained from: Chart, mother  HPI: Carmen Daniels is a 34 y.o. female with a history of bipolar disorder, borderline personality disorder, migraines who presented to the hospital on 2/3 of with altered mental status yesterday.   Speaking with her mother, she apparently bought some drugs presumed to be Klonopin, Xanax, hydrocodone recently with the intention of overdosing and killing herself.  She confided this to a friend.  She apparently has been acting very intoxicated over the past few days.  She then was found on 2/3 by her mother appearing to have overdosed.  She apparently was still able to respond to EMS somewhat.  Mother states that she found multiple medications, but Wellbutrin was clearly opened and she found some Wellbutrin tablets scattered around.  There is also Ambien and Flexeril, though it is unclear how much of this she took, also unclear what other substances she could have used.  Her mental status continued to worsen over the course of the day.  It was noted that she had open "intermittent whole body shaking/tremors" and an EEG was performed this afternoon which did not show any ongoing seizure activity.  She began having seizure-like episodes which progressively became more frequent.  For this reason she was intubated around 7 PM and transferred to [facility] for continuous EEG.  She was given Versed and loaded with Dilantin, as she was being transferred the seizures became much more frequent.  On arrival, she would have 10 to 15 seconds of seizure activity followed by 10 to 15 seconds of quiescence.  At this point, she had received less than 10 mg of midazolam as well as a fosphenytoin load.  She was loaded with 4 g of IV Keppra and was given 10 mg IV midazolam.  Unfortunately due to hypotension, dosing of midazolam could not be done as quickly as  desired.  CCM was notified of her state and they responded promptly and started a central line.  Including initial 10 mg, she was given a total of 65 mg of midazolam as initial bolus divided into 10 mg doses separated by at least 5 minutes.  She was also started on 30 mcg/kg/min of propofol right about the same time that cessation of clinical seizure activity was observed.  She then had a few more clinical seizures and this was increased to 40 mcg/kg/min with quiescence of both seizure activity and bi-PLEDs.   ROS: Unable to obtain due to altered mental status.   Past Medical History:  Diagnosis Date  . Acute rhinosinusitis 07/03/2018  . Allergic rhinitis   . Anxiety   . Asthma   . Back pain   . Bipolar disorder ([place])   . Borderline personality disorder ([place])   . Chronic pain   . Chronic pain syndrome   . Depression   . Hallucinations   . Migraines   . Nasal polyps   . Nausea   . Neck pain   . OCD (obsessive compulsive disorder)   . OCD (obsessive compulsive disorder)   . Ovarian cyst    left     Family History  Problem Relation Age of Onset  . Healthy Father   . Healthy Mother   . Arthritis Maternal Grandmother   . Arthritis Maternal Grandfather   . Depression Paternal Grandmother      Social History:  reports that she has been smoking cigarettes. She has been smoking  about 0.50 packs per day. She has never used smokeless tobacco. She reports current alcohol use. She reports that she does not use drugs.   Exam: Current vital signs: BP 93/72   Pulse 78   Temp 99.3 F (37.4 C) (Oral)   Resp 20   Ht 5\' 10"  (1.778 m) Comment: measured pt x3  Wt 83.3 kg   LMP  (LMP Unknown)   SpO2 100%   Breastfeeding No   BMI 26.35 kg/m  Vital signs in last 24 hours: Temp:  [89.9 F (32.2 C)-99.3 F (37.4 C)] 99.3 F (37.4 C) (02/04 1935) Pulse Rate:  [63-101] 78 (02/04 2125) Resp:  [11-28] 20 (02/04 2125) BP: (85-136)/(40-96) 93/72 (02/04 2125) SpO2:  [93 %-100 %] 100 %  (02/04 2125) FiO2 (%):  [80 %-100 %] 80 % (02/04 2122) Weight:  [83.3 kg] 83.3 kg (02/04 1300)   Physical Exam  Constitutional: Appears well-developed and well-nourished.  Psych: Affect appropriate to situation Eyes: No scleral injection HENT: Intubated MSK: no joint deformities.  Cardiovascular: Normal rate and regular rhythm.  Respiratory: Effort normal, non-labored breathing GI: Soft.  No distension. There is no tenderness.  Skin: WDI  Neuro: Mental Status: Patient is comatose, eyes open only associate with seizure activity Cranial Nerves: II: Does not blink threat pupils are 7 mm equal, round, and sluggish but reactive III,IV, VI: Doll's eye absent V: VII: Corneals absent X: Cough absent Motor: No response to noxious stimulation, intermittent clonic activity is present  sensory: Does not respond noxious stimulation  deep Tendon Reflexes: Reflexes are depressed throughout  Cerebellar: Does not perform  I have reviewed labs in epic and the results pertinent to this consultation are: Ammonia 31 Lithium level, 0.11 Procalcitonin-negative UDS positive for benzodiazepines only  I have reviewed the images obtained: Initial CT head-negative  Impression: 34 year old female with status epilepticus likely secondary to Wellbutrin overdose.  Flexeril can also cause seizures, but I think that given that her Wellbutrin bottle was open with pills being dropped, that this is the more likely culprit.  This would also account for her prolonged QTC and the XL formulation could account for the more prolonged time course.  At this time, I suspect that her fever is explained by her prolonged status epilepticus, given the clear intent, preparation, and situation that she was found, I think that drug overdose adequately explains her clinical situation.  Given that this is a provoked seizure, I do not think she needs to be on long-term antiepileptics.  If she does need a second antiepileptic  in addition to Keppra, I would favor using Depakote rather than Dilantin.   Though her exam could be explained by prolonged status epilepticus, given her dismal current exam I would favor repeating a head CT at this time.  Recommendations: 1) LTM EEG with titration of sedative medications for seizure suppression 2) continue Keppra 1.5 g twice daily 3) head CT  4) continue management of nonneurological complications of Wellbutrin overdose per CCM  This patient is critically ill and at significant risk of neurological worsening, death and care requires constant monitoring of vital signs, hemodynamics,respiratory and cardiac monitoring, neurological assessment, discussion with family, other specialists and medical decision making of high complexity. I spent 90 minutes of neurocritical care time  in the care of  this patient. This was time spent independent of any time provided by nurse practitioner or PA.  Roland Rack, MD [facility] [phone]  If 7pm- 7am, please page neurology on call as listed in  [facility]. 03/30/2019  2:14 AM

## 2019-03-30 ENCOUNTER — Inpatient Hospital Stay (HOSPITAL_COMMUNITY): Payer: Medicaid Other

## 2019-03-30 ENCOUNTER — Other Ambulatory Visit (HOSPITAL_COMMUNITY): Payer: Medicaid Other

## 2019-03-30 DIAGNOSIS — R578 Other shock: Secondary | ICD-10-CM

## 2019-03-30 DIAGNOSIS — G40901 Epilepsy, unspecified, not intractable, with status epilepticus: Secondary | ICD-10-CM

## 2019-03-30 DIAGNOSIS — J988 Other specified respiratory disorders: Secondary | ICD-10-CM

## 2019-03-30 LAB — PHOSPHORUS
Phosphorus: 1.9 mg/dL — ABNORMAL LOW (ref 2.5–4.6)
Phosphorus: 2.9 mg/dL (ref 2.5–4.6)

## 2019-03-30 LAB — COMPREHENSIVE METABOLIC PANEL
ALT: 136 U/L — ABNORMAL HIGH (ref 0–44)
AST: 197 U/L — ABNORMAL HIGH (ref 15–41)
Albumin: 2.7 g/dL — ABNORMAL LOW (ref 3.5–5.0)
Alkaline Phosphatase: 56 U/L (ref 38–126)
Anion gap: 7 (ref 5–15)
BUN: 8 mg/dL (ref 6–20)
CO2: 24 mmol/L (ref 22–32)
Calcium: 7.3 mg/dL — ABNORMAL LOW (ref 8.9–10.3)
Chloride: 112 mmol/L — ABNORMAL HIGH (ref 98–111)
Creatinine, Ser: 0.72 mg/dL (ref 0.44–1.00)
GFR calc Af Amer: >60 mL/min (ref >60)
GFR calc non Af Amer: >60 mL/min (ref >60)
Glucose, Bld: 110 mg/dL — ABNORMAL HIGH (ref 70–99)
Potassium: 4.2 mmol/L (ref 3.5–5.1)
Sodium: 143 mmol/L (ref 135–145)
Total Bilirubin: 1.1 mg/dL (ref 0.3–1.2)
Total Protein: 4.7 g/dL — ABNORMAL LOW (ref 6.5–8.1)

## 2019-03-30 LAB — ACETAMINOPHEN LEVEL
Acetaminophen (Tylenol), Serum: <10 ug/mL — ABNORMAL LOW (ref 10–30)
Acetaminophen (Tylenol), Serum: <10 ug/mL — ABNORMAL LOW (ref 10–30)

## 2019-03-30 LAB — LACTIC ACID, PLASMA: Lactic Acid, Venous: 4.2 mmol/L (ref 0.5–1.9)

## 2019-03-30 LAB — POCT I-STAT 7, (LYTES, BLD GAS, ICA,H+H)
Acid-Base Excess: 1 mmol/L (ref 0.0–2.0)
Bicarbonate: 25.5 mmol/L (ref 20.0–28.0)
Calcium, Ion: 1.05 mmol/L — ABNORMAL LOW (ref 1.15–1.40)
HCT: 32 % — ABNORMAL LOW (ref 36.0–46.0)
Hemoglobin: 10.9 g/dL — ABNORMAL LOW (ref 12.0–15.0)
O2 Saturation: 100 %
Patient temperature: 37
Potassium: 3.2 mmol/L — ABNORMAL LOW (ref 3.5–5.1)
Sodium: 147 mmol/L — ABNORMAL HIGH (ref 135–145)
TCO2: 27 mmol/L (ref 22–32)
pCO2 arterial: 39.7 mmHg (ref 32.0–48.0)
pH, Arterial: 7.416 (ref 7.350–7.450)
pO2, Arterial: 188 mmHg — ABNORMAL HIGH (ref 83.0–108.0)

## 2019-03-30 LAB — GLUCOSE, CAPILLARY
Glucose-Capillary: 92 mg/dL (ref 70–99)
Glucose-Capillary: 78 mg/dL (ref 70–99)
Glucose-Capillary: 68 mg/dL — ABNORMAL LOW (ref 70–99)
Glucose-Capillary: 84 mg/dL (ref 70–99)
Glucose-Capillary: 80 mg/dL (ref 70–99)

## 2019-03-30 LAB — HEPATIC FUNCTION PANEL
ALT: 150 U/L — ABNORMAL HIGH (ref 0–44)
AST: 248 U/L — ABNORMAL HIGH (ref 15–41)
Albumin: 2.8 g/dL — ABNORMAL LOW (ref 3.5–5.0)
Alkaline Phosphatase: 56 U/L (ref 38–126)
Bilirubin, Direct: 0.7 mg/dL — ABNORMAL HIGH (ref 0.0–0.2)
Indirect Bilirubin: 0.5 mg/dL (ref 0.3–0.9)
Total Bilirubin: 1.2 mg/dL (ref 0.3–1.2)
Total Protein: 5.1 g/dL — ABNORMAL LOW (ref 6.5–8.1)

## 2019-03-30 LAB — LITHIUM LEVEL
Lithium Lvl: 0.14 mmol/L — ABNORMAL LOW (ref 0.60–1.20)
Lithium Lvl: <0.06 mmol/L — ABNORMAL LOW (ref 0.60–1.20)

## 2019-03-30 LAB — MAGNESIUM
Magnesium: 1.6 mg/dL — ABNORMAL LOW (ref 1.7–2.4)
Magnesium: 2.1 mg/dL (ref 1.7–2.4)

## 2019-03-30 LAB — ECHOCARDIOGRAM COMPLETE
Height: 70 in
Weight: 2994.73 oz

## 2019-03-30 LAB — CBC
HCT: 32.7 % — ABNORMAL LOW (ref 36.0–46.0)
Hemoglobin: 11.9 g/dL — ABNORMAL LOW (ref 12.0–15.0)
MCH: 34.2 pg — ABNORMAL HIGH (ref 26.0–34.0)
MCHC: 36.4 g/dL — ABNORMAL HIGH (ref 30.0–36.0)
MCV: 94 fL (ref 80.0–100.0)
Platelets: 269 10*3/uL (ref 150–400)
RBC: 3.48 MIL/uL — ABNORMAL LOW (ref 3.87–5.11)
RDW: 12.9 % (ref 11.5–15.5)
WBC: 10.5 10*3/uL (ref 4.0–10.5)
nRBC: 0 % (ref 0.0–0.2)

## 2019-03-30 LAB — CK: Total CK: 413 U/L — ABNORMAL HIGH (ref 38–234)

## 2019-03-30 MED ORDER — PEG 3350-KCL-NA BICARB-NACL 420 G PO SOLR
4000.0000 mL | Freq: Once | ORAL | Status: AC
  Administered 2019-03-30: 4000 mL via ORAL
  Filled 2019-03-30: qty 4000

## 2019-03-30 MED ORDER — DEXTROSE 50 % IV SOLN
1.0000 | Freq: Once | INTRAVENOUS | Status: DC
  Filled 2019-03-30: qty 50

## 2019-03-30 MED ORDER — MAGNESIUM SULFATE 2 GM/50ML IV SOLN
2.0000 g | Freq: Once | INTRAVENOUS | Status: AC
  Administered 2019-03-30: 2 g via INTRAVENOUS
  Filled 2019-03-30: qty 50

## 2019-03-30 MED ORDER — DEXTROSE 50 % IV SOLN
INTRAVENOUS | Status: AC
  Administered 2019-03-30: 12.5 g via INTRAVENOUS
  Filled 2019-03-30: qty 50

## 2019-03-30 MED ORDER — VITAL HIGH PROTEIN PO LIQD: 1000.0000 mL | ORAL | Status: DC

## 2019-03-30 MED ORDER — POTASSIUM CHLORIDE 20 MEQ/15ML (10%) PO SOLN
40.0000 meq | ORAL | Status: AC
  Administered 2019-03-30: 40 meq
  Filled 2019-03-30: qty 30

## 2019-03-30 MED ORDER — CHLORHEXIDINE GLUCONATE 0.12% ORAL RINSE (MEDLINE KIT)
15.0000 mL | Freq: Two times a day (BID) | OROMUCOSAL | Status: DC
  Administered 2019-03-29 – 2019-04-03 (×11): 15 mL via OROMUCOSAL

## 2019-03-30 MED ORDER — PANTOPRAZOLE SODIUM 40 MG PO PACK
40.0000 mg | PACK | ORAL | Status: DC
  Administered 2019-03-30 – 2019-04-01 (×3): 40 mg
  Filled 2019-03-30 (×3): qty 20

## 2019-03-30 MED ORDER — VITAL AF 1.2 CAL PO LIQD
1000.0000 mL | ORAL | Status: DC
  Administered 2019-03-30 – 2019-03-31 (×2): 1000 mL

## 2019-03-30 MED ORDER — LEVETIRACETAM IN NACL 1000 MG/100ML IV SOLN: 1000.0000 mg | Freq: Two times a day (BID) | INTRAVENOUS | Status: DC

## 2019-03-30 MED ORDER — SODIUM CHLORIDE 0.9 % IV SOLN
30.0000 mg/h | INTRAVENOUS | Status: DC
  Administered 2019-03-30 – 2019-03-31 (×9): 30 mg/h via INTRAVENOUS
  Administered 2019-03-31: 15 mg/h via INTRAVENOUS
  Filled 2019-03-30 (×3): qty 20
  Filled 2019-03-30: qty 10
  Filled 2019-03-30 (×6): qty 20
  Filled 2019-03-30: qty 10
  Filled 2019-03-30 (×7): qty 20

## 2019-03-30 MED ORDER — PRO-STAT SUGAR FREE PO LIQD
30.0000 mL | Freq: Two times a day (BID) | ORAL | Status: DC
  Administered 2019-03-30 – 2019-04-01 (×5): 30 mL
  Filled 2019-03-30 (×5): qty 30

## 2019-03-30 MED ORDER — SODIUM PHOSPHATES 45 MMOLE/15ML IV SOLN
20.0000 mmol | Freq: Once | INTRAVENOUS | Status: AC
  Administered 2019-03-30: 20 mmol via INTRAVENOUS
  Filled 2019-03-30: qty 6.67

## 2019-03-30 MED ORDER — DEXTROSE 50 % IV SOLN: 12.5000 g | Freq: Once | INTRAVENOUS | Status: AC

## 2019-03-30 MED ORDER — FAT EMULSION PLANT BASED 20 % IV EMUL
250.0000 mL | INTRAVENOUS | Status: DC
  Administered 2019-03-30: 250 mL via INTRAVENOUS
  Filled 2019-03-30: qty 250

## 2019-03-30 MED ORDER — SODIUM CHLORIDE 0.9% FLUSH
10.0000 mL | Freq: Two times a day (BID) | INTRAVENOUS | Status: DC
  Administered 2019-03-30 – 2019-04-09 (×18): 10 mL

## 2019-03-30 MED ORDER — LEVETIRACETAM IN NACL 1500 MG/100ML IV SOLN
1500.0000 mg | Freq: Two times a day (BID) | INTRAVENOUS | Status: DC
  Administered 2019-03-30 – 2019-04-01 (×6): 1500 mg via INTRAVENOUS
  Filled 2019-03-30 (×7): qty 100

## 2019-03-30 MED ORDER — ORAL CARE MOUTH RINSE
15.0000 mL | OROMUCOSAL | Status: DC
  Administered 2019-03-30 – 2019-04-01 (×27): 15 mL via OROMUCOSAL

## 2019-03-30 MED ORDER — POTASSIUM CHLORIDE 20 MEQ/15ML (10%) PO SOLN
40.0000 meq | ORAL | Status: DC
  Administered 2019-03-30: 40 meq via ORAL
  Filled 2019-03-30: qty 30

## 2019-03-30 MED ORDER — SODIUM CHLORIDE 0.9% FLUSH
10.0000 mL | INTRAVENOUS | Status: DC | PRN
  Administered 2019-04-07: 10 mL

## 2019-03-30 NOTE — Progress Notes (Signed)
Transported patient to CT and back to 4N20 without event.

## 2019-03-30 NOTE — Procedures (Addendum)
Patient Name: Carmen Daniels  MRN: [id number]  Epilepsy Attending: Lora Havens  Referring Physician/Provider: Dr. Kathrynn Speed Duration: 03/29/2019 2315  To 03/30/2019 2315  Patient history: 34 yo F who presented with ams. Likely suicidal overdose with dilated pupils and intermittent myoclonus-like activity.  EEG to evaluate for seizures  Level of alertness: comatose/sedated  AEDs during EEG study: propofol, versed, keppra, dilantin  Technical aspects: This EEG study was done with scalp electrodes positioned according to the 10-20 International system of electrode placement. Electrical activity was acquired at a sampling rate of 500Hz  and reviewed with a high frequency filter of 70Hz  and a low frequency filter of 1Hz . EEG data were recorded continuously and digitally stored.   Description: EEG initially showed continuous generalized low amplitude 23 Hz delta slowing with intermittent (0.25 Hz) right anterior temporal sharp waves.  Gradually after midnight eeg showed continuous generalized 2 to 3 Hz delta activity with overriding 15 to 18 Hz, 2-3 uV beta activity with irregular morphology distributed symmetrically and diffusely.   Hyperventilation and photic stimulation were not performed.  Abnormality -Sharp wave,  Right anterior temporal region -Continuous slow, generalized -Excessive beta, generalized  IMPRESSION: This study initially showed evidence of potential epileptogenicity from the right anterior temporal region.  After around midnight, EEG showed profound diffuse encephalopathy, nonspecific to etiology but most likely secondary to sedation. The excessive beta activity seen in the background is most likely due to the effect of benzodiazepine and is a benign EEG pattern. No seizures were seen throughout the recording.  EEG appears significantly improved compared to routine EEG done prior to the study.    Aniello Christopoulos [name]

## 2019-03-30 NOTE — Progress Notes (Signed)
NAME:  Carmen Daniels, MRN:  [id number], DOB:  Jun 24, 1985, LOS: 1 ADMISSION DATE:  03/28/2019, CONSULTATION DATE: 03/29/2019 REFERRING MD:  Dr. Leonette Monarch, ED, CHIEF COMPLAINT:  AMS   Brief History   34 yo female smoker with hx of Bipolar and OCD found with altered mental status from presumed intentional overdose.  Empty bottles of lithium, ambien, flexeril and wellbutrin found. Developed status epilepticus.  Required intubation for airway protection and started on burst suppression.  Past Medical History  Bipolar, depression, OCD, Cocaine abuse  Significant Hospital Events   2/04 Admit 2/05 start burst suppression  Consults:  Neurology  Procedures:  ETT 2/04 >> Rt IJ CVL 2/04 >>   Significant Diagnostic Tests:  CT head 2/05 >> no acute findings Echo 2/05 >>   Micro Data:  SARS CoV2 PCR 2/03 >> negative Influenza PCR 2/03 >> negative Blood 2/04 >>  Antimicrobials:  Clindamycin 2/04 >>  Diflucan 2/04 >>   Interim history/subjective:    Objective   Blood pressure 136/76, pulse 74, temperature 98.1 F (36.7 C), resp. rate 17, height 5\' 10"  (1.778 m), weight 84.9 kg, SpO2 100 %, not currently breastfeeding.    Vent Mode: PRVC FiO2 (%):  [40 %-100 %] 40 % Set Rate:  [12 bmp-22 bmp] 16 bmp Vt Set:  [540 mL-580 mL] 540 mL PEEP:  [5 cmH20] 5 cmH20 Plateau Pressure:  [13 cmH20-17 cmH20] 17 cmH20   Intake/Output Summary (Last 24 hours) at 03/30/2019 0944 Last data filed at 03/30/2019 0900 Gross per 24 hour  Intake 16235.03 ml  Output 5450 ml  Net 10785.03 ml   Filed Weights   03/29/19 1300 03/30/19 0500  Weight: 83.3 kg 84.9 kg    Examination:  General - sedated Eyes - pupils dilated ENT - ETT in place Cardiac - regular rate/rhythm, no murmur Chest - equal breath sounds b/l, no wheezing or rales Abdomen - soft, non tender, + bowel sounds Extremities - no cyanosis, clubbing, or edema Skin - no rashes Neuro - RASS -4  Resolved Hospital Problem list     Assessment  & Plan:   Acute hypoxic respiratory failure with compromised airway. - full vent support  Acute metabolic encephalopathy in setting of intentional overdose. Status epilepticus. - burst suppression  Hypotension from sedation. - continue IV fluids - pressors to keep MAP > 65  Bipolar, depression, OCD. - will need psych assessment if she improves medically  Rt hand cellulitis. - day 2 of clindamycin  Best practice:  Diet: tube feeds DVT prophylaxis: lovenox GI prophylaxis: protonix Mobility: bed rest Code Status: full code Disposition: ICU  Labs    CMP Latest Ref Rng & Units 03/30/2019 03/29/2019 03/29/2019  Glucose 70 - 99 mg/dL - - 110(H)  BUN 6 - 20 mg/dL - - 11  Creatinine 0.44 - 1.00 mg/dL - - 0.65  Sodium 135 - 145 mmol/L 147(H) 152(H) 142  Potassium 3.5 - 5.1 mmol/L 3.2(L) 3.6 3.1(L)  Chloride 98 - 111 mmol/L - - 105  CO2 22 - 32 mmol/L - - 29  Calcium 8.9 - 10.3 mg/dL - - 8.2(L)  Total Protein 6.5 - 8.1 g/dL - - -  Total Bilirubin 0.3 - 1.2 mg/dL - - -  Alkaline Phos 38 - 126 U/L - - -  AST 15 - 41 U/L - - -  ALT 0 - 44 U/L - - -    CBC Latest Ref Rng & Units 03/30/2019 03/29/2019 03/29/2019  WBC 4.0 - 10.5 K/uL - -  9.1  Hemoglobin 12.0 - 15.0 g/dL 10.9(L) 11.9(L) 12.2  Hematocrit 36.0 - 46.0 % 32.0(L) 35.0(L) 37.1  Platelets 150 - 400 K/uL - - 228    ABG    Component Value Date/Time   PHART 7.416 03/30/2019 0625   PCO2ART 39.7 03/30/2019 0625   PO2ART 188.0 (H) 03/30/2019 0625   HCO3 25.5 03/30/2019 0625   TCO2 27 03/30/2019 0625   O2SAT 100.0 03/30/2019 0625    CBG (last 3)  Recent Labs    03/28/19 2126  GLUCAP 118*    CC time 39 minutes  Chesley Mires, MD [facility] 03/30/2019, 10:05 AM

## 2019-03-30 NOTE — Progress Notes (Signed)
LTM maint complete - no skin breakdown under:  Pz, o2, fp1

## 2019-03-30 NOTE — Progress Notes (Signed)
Spoke with pt's sister who is IR physician at [facility].  Updated about current status and treatment plan.  Explained main issue going ahead with be neuro status at what happens when sedation starts to be lightened.   Chesley Mires, MD [facility]/Critical Care 03/30/2019, 11:28 AM

## 2019-03-30 NOTE — Progress Notes (Signed)
Subjective: Patient was noted to be in status epilepticus overnight.  Was started on Versed and propofol, no further seizures since then.  [id number] to obtain due to poor mental status  Examination  Vital signs in last 24 hours: Temp:  [89.9 F (32.2 C)-103.4 F (39.7 C)] 97.9 F (36.6 C) (02/05 1000) Pulse Rate:  [63-102] 74 (02/05 1000) Resp:  [0-29] 21 (02/05 1000) BP: (84-136)/(31-101) 128/71 (02/05 1000) SpO2:  [93 %-100 %] 100 % (02/05 1000) Arterial Line BP: (90-134)/(55-88) 90/76 (02/05 1000) FiO2 (%):  [40 %-100 %] 40 % (02/05 0800) Weight:  [83.3 kg-84.9 kg] 84.9 kg (02/05 0500)  General: lying in bed, not in apparent distress CVS: pulse-normal rate and rhythm RS: breathing comfortably, intubated Extremities: normal, warm  Neuro: On Versed and propofol  MS: Comatose, does not open eyes to noxious stimuli CN: Bilateral pupils 4 mm dilated and not reacting to light, corneal reflex absent, gag reflex absent, unable to assess rest of the cranial nerves due to intubation. Motor: Does not withdraw to noxious stimuli in bilateral upper extremities, withdraws to noxious stimuli in bilateral lower extremities with 2/5 strength  Reflexes: 1+ bilaterally over patella, biceps  Basic Metabolic Panel: Recent Labs  Lab 03/28/19 2125 03/29/19 0257 03/29/19 1816 03/29/19 2151 03/30/19 0625  NA 140  --  142 152* 147*  K 4.1  --  3.1* 3.6 3.2*  CL 102  --  105  --   --   CO2 24  --  29  --   --   GLUCOSE 111*  --  110*  --   --   BUN 15  --  11  --   --   CREATININE 0.81 0.76 0.65  --   --   CALCIUM 9.8  --  8.2*  --   --     CBC: Recent Labs  Lab 03/28/19 2125 03/29/19 1816 03/29/19 2151 03/30/19 0625 03/30/19 1000  WBC 21.0* 9.1  --   --  10.5  NEUTROABS  --  8.2*  --   --   --   HGB 14.3 12.2 11.9* 10.9* 11.9*  HCT 44.6 37.1 35.0* 32.0* 32.7*  MCV 93.9 92.3  --   --  94.0  PLT 305 228  --   --  269     Coagulation Studies: No results for input(s): LABPROT,  INR in the last 72 hours.  Imaging CT head without contrast 03/30/2019: No acute abnormality.  ASSESSMENT AND PLAN: 34 year old female who presented after suicidal overdose on Wellbutrin.  Was noted to be in status epilepticus overnight and started on Versed, propofol.  Status epilepticus (resolved) with provoked seizures Suicidal overdose Acute toxic metabolic encephalopathy Acute respiratory failure Bipolar disorder Right hand cellulitis Hypertension Prolonged QT interval Anemia Hypoalbuminemia Transaminitis Hyperbilirubinemia Elevated CK -Status epilepticus in the setting of Wellbutrin overdose, no further seizures since starting Versed and propofol.  Recommendations -Continue current dose of Versed and propofol infusion for 24 hours.  Plan to wean sedation tomorrow if patient continues to be seizure-free today. - Continue Keppra 1500 mg twice daily.  If patient has any further seizures, consider adding valproic acid. -Continue LTM EEG to monitor for any seizure activity. -We will obtain MRI brain without contrast once EEG is disconnected, likely on Monday -Anticipate patient will not require long-term antiepileptics as these are likely provoked seizures in setting of overdose. -Continue seizure precautions -As needed IV midazolam 5 mg bolus for clinical seizure-like activity -Management of other comorbidities per primary  team  CRITICAL CARE Performed by: Lora Havens   Total critical care time: 37minutes  Critical care time was exclusive of separately billable procedures and treating other patients.  Critical care was necessary to treat or prevent imminent or life-threatening deterioration.  Critical care was time spent personally by me on the following activities: development of treatment plan with patient and/or surrogate as well as nursing, discussions with consultants, evaluation of patient's response to treatment, examination of patient, obtaining history from  patient or surrogate, ordering and performing treatments and interventions, ordering and review of laboratory studies, ordering and review of radiographic studies, pulse oximetry and re-evaluation of patient's condition.

## 2019-03-30 NOTE — Progress Notes (Signed)
Initial Nutrition Assessment  DOCUMENTATION CODES:   Not applicable  INTERVENTION:   Initiate Vital AF 1.2@ 45 ml/hr (1080 ml/day) via OG tube 30 ml Prostat BID  Provides: 1496 kcal, 111 grams of protein, and 875 ml free water.  TF regimen and propofol at current rate providing 2156 total kcal/day    NUTRITION DIAGNOSIS:   Inadequate oral intake related to inability to eat as evidenced by NPO status.  GOAL:   Patient will meet greater than or equal to 90% of their needs  MONITOR:   TF tolerance, Vent status  REASON FOR ASSESSMENT:   Consult, Ventilator Enteral/tube feeding initiation and management  ASSESSMENT:   Pt with PMH of bipolar, OCD, depression, and cocaine abuse admitted 2/4 after OD.    Pt in ICU, intubated on burst suppression.  Pt discussed during ICU rounds and with RN. Per RN pt received liters of Golytely which has been discontinued.    Patient is currently intubated on ventilator support MV: 8.3 L/min Temp (24hrs), Avg:98.6 F (37 C), Min:89.9 F (32.2 C), Max:103.4 F (39.7 C)  Propofol: 25 ml/hr (50 mcg) provides: 660 kcal  Medications reviewed and include:  Levo @ 4 mcg  Labs reviewed: AST: 248 (H), 150 (H)    NUTRITION - FOCUSED PHYSICAL EXAM:    Most Recent Value  Orbital Region  No depletion  Upper Arm Region  No depletion  Thoracic and Lumbar Region  No depletion  Buccal Region  Unable to assess  Temple Region  No depletion  Clavicle Bone Region  No depletion  Clavicle and Acromion Bone Region  No depletion  Scapular Bone Region  Unable to assess  Dorsal Hand  No depletion  Patellar Region  No depletion  Anterior Thigh Region  No depletion  Posterior Calf Region  No depletion  Edema (RD Assessment)  None  Hair  Reviewed  Eyes  Unable to assess  Mouth  Unable to assess  Skin  Reviewed  Nails  Reviewed       Diet Order:   Diet Order            Diet NPO time specified  Diet effective now              EDUCATION  NEEDS:   No education needs have been identified at this time  Skin:  Skin Assessment: (cellulitis R hand)  Last BM:  2 L via rectal tube  Height:   Ht Readings from Last 1 Encounters:  03/29/19 5\' 10"  (1.778 m)    Weight:   Wt Readings from Last 1 Encounters:  03/30/19 84.9 kg    Ideal Body Weight:  68.1 kg  BMI:  Body mass index is 26.86 kg/m.  Estimated Nutritional Needs:   Kcal:  2241  Protein:  110-130 grams  Fluid:  2 L/day  Lockie Pares., RD, LDN, CNSC See AMiON for contact information

## 2019-03-30 NOTE — Progress Notes (Signed)
  Echocardiogram 2D Echocardiogram has been performed.  Carmen Daniels 03/30/2019, 9:53 AM

## 2019-03-30 NOTE — Progress Notes (Signed)
CRITICAL VALUE ALERT  Critical Value:  Lactic acid 4.2   Date & Time Notied:  0630 03/30/2019   Provider Notified: Dr Orpah Melter  Orders Received/Actions taken: no new orders

## 2019-03-30 NOTE — Procedures (Signed)
Patient Name: Carmen Daniels  MRN: [id number]  EEG Attending: Roland Rack  Referring Physician/Provider: Roland Rack Date: 03/29/2019 Duration: 20 minutes  Patient history: 34 year old female with suspected Wellbutrin overdose.  She was having active clinical convulsions until just a few minutes prior to beginning of the recording.  Level of alertness: Sedated  AEDs during EEG study: Keppra, Dilantin, Versed at 30 mg/h, recently bolused as well, propofol 30 mcg/kg/min  Technical aspects: This EEG study was done with scalp electrodes positioned according to the 10-20 International system of electrode placement. Electrical activity was acquired at a sampling rate of 500Hz  and reviewed with a high frequency filter of 70Hz  and a low frequency filter of 1Hz . EEG data were recorded continuously and digitally stored.   BACKGROUND ACTIVITY: There is a burst suppression pattern with bursts lasting 2 to 3 seconds and consisting of nonepileptiform-appearing brief run of beta followed by admixed theta and delta.  The interburst interval is 4 to 5 seconds.  Following cessation of her seizures  EPILEPTIFORM ACTIVITY: There are bilateral independent periodic discharges which are maximal in the frontotemporal regions(Bi-PLEDs) with a frequency of 1 to 2 Hz, no evolution, though they do cease completely at times, and are less frequent over the course of the recording.  There is no associated fast activity or other concerning features to the periodic discharges.  Following the initial burst suppression appearance, the burst become more epileptiform appearing and then seizures are observed lasting 5 to 10 seconds consisting of high-voltage generalized polyspike involving the polyspike and wave discharges.  Though the video did not capture these events, there is EKG artifact towards the end of the event consistent with clinical seizure and clinical seizure was reported by the tech.  The last seizure  occurred at 22:59:49  Following the last seizure, by PLEDs continue to be seen without ictal activity.  OTHER EVENTS: None  SLEEP RECORDINGS:  No clear sleep structures were seen  ACTIVATION PROCEDURES:  Hyperventilation and photic stimulation were not performed.  IMPRESSION: This study recorded multiple brief generalized seizures with last seizure recorded just prior to 11 PM.  Given the lack of return to baseline interictally, there is consistent with status epilepticus with cessation over the course of this recording.  The periodic discharges which are subsequently seen are on the ictal-interictal continuum, but are likely postictal in this case.  Roland Rack, MD [facility] [phone]  If 7pm- 7am, please page neurology on call as listed in [place].

## 2019-03-31 ENCOUNTER — Inpatient Hospital Stay (HOSPITAL_COMMUNITY): Payer: Medicaid Other

## 2019-03-31 DIAGNOSIS — R569 Unspecified convulsions: Secondary | ICD-10-CM

## 2019-03-31 LAB — CBC
HCT: 31.6 % — ABNORMAL LOW (ref 36.0–46.0)
Hemoglobin: 10.2 g/dL — ABNORMAL LOW (ref 12.0–15.0)
MCH: 30 pg (ref 26.0–34.0)
MCHC: 32.3 g/dL (ref 30.0–36.0)
MCV: 92.9 fL (ref 80.0–100.0)
Platelets: 169 10*3/uL (ref 150–400)
RBC: 3.4 MIL/uL — ABNORMAL LOW (ref 3.87–5.11)
RDW: 13.1 % (ref 11.5–15.5)
WBC: 11.9 10*3/uL — ABNORMAL HIGH (ref 4.0–10.5)
nRBC: 0 % (ref 0.0–0.2)

## 2019-03-31 LAB — GLUCOSE, CAPILLARY
Glucose-Capillary: 81 mg/dL (ref 70–99)
Glucose-Capillary: 85 mg/dL (ref 70–99)
Glucose-Capillary: 105 mg/dL — ABNORMAL HIGH (ref 70–99)
Glucose-Capillary: 106 mg/dL — ABNORMAL HIGH (ref 70–99)
Glucose-Capillary: 99 mg/dL (ref 70–99)
Glucose-Capillary: 115 mg/dL — ABNORMAL HIGH (ref 70–99)

## 2019-03-31 LAB — BASIC METABOLIC PANEL
Anion gap: 8 (ref 5–15)
BUN: 10 mg/dL (ref 6–20)
CO2: 23 mmol/L (ref 22–32)
Calcium: 8.2 mg/dL — ABNORMAL LOW (ref 8.9–10.3)
Chloride: 110 mmol/L (ref 98–111)
Creatinine, Ser: 0.52 mg/dL (ref 0.44–1.00)
GFR calc Af Amer: >60 mL/min (ref >60)
GFR calc non Af Amer: >60 mL/min (ref >60)
Glucose, Bld: 105 mg/dL — ABNORMAL HIGH (ref 70–99)
Potassium: 3.8 mmol/L (ref 3.5–5.1)
Sodium: 141 mmol/L (ref 135–145)

## 2019-03-31 LAB — PHOSPHORUS
Phosphorus: 2.3 mg/dL — ABNORMAL LOW (ref 2.5–4.6)
Phosphorus: 1.9 mg/dL — ABNORMAL LOW (ref 2.5–4.6)

## 2019-03-31 LAB — LACTIC ACID, PLASMA: Lactic Acid, Venous: 1.4 mmol/L (ref 0.5–1.9)

## 2019-03-31 LAB — MAGNESIUM
Magnesium: 1.9 mg/dL (ref 1.7–2.4)
Magnesium: 1.9 mg/dL (ref 1.7–2.4)

## 2019-03-31 MED ORDER — DEXTROSE IN LACTATED RINGERS 5 % IV SOLN: INTRAVENOUS | Status: DC

## 2019-03-31 NOTE — Progress Notes (Addendum)
Subjective: Versed at a rate of 30 mg/hr, propofol at a rate of 50 mcg/kg/min  Objective: Current vital signs: BP 115/60   Pulse 84   Temp 98.6 F (37 C)   Resp (!) 21   Ht _0  (1.778 m) Comment: measured pt x3  Wt 84.7 kg   SpO2 100%   Breastfeeding No   BMI 26.79 kg/m  Vital signs in last 24 hours: Temp:  [97.3 F (36.3 C)-98.6 F (37 C)] 98.6 F (37 C) (02/06 0500) Pulse Rate:  [73-89] 84 (02/06 0700) Resp:  [0-37] 21 (02/06 0700) BP: (101-136)/(37-76) 115/60 (02/06 0700) SpO2:  [99 %-100 %] 100 % (02/06 0700) Arterial Line BP: (90-187)/(59-184) 114/75 (02/06 0700) FiO2 (%):  [40 %] 40 % (02/06 0103) Weight:  [84.7 kg] 84.7 kg (02/06 0500)  Intake/Output from previous day: 02/05 0701 - 02/06 0700 In: 5400.5 [I.V.:3150.8; NG/GT:1627; IV Piggyback:622.7] Out: 5350 [Urine:3450; Stool:1900] Intake/Output this shift: No intake/output data recorded. Nutritional status:  Diet Order            Diet NPO time specified  Diet effective now             HEENT: [place]/AT Lungs: Intubated Skin: Healed cut marks on lateral right thigh.   Ext: Noncyanotic  Neuro: Exam performed while sedated on Versed and propofol  MS: Deep sedation. Does not open eyes to loud clap or noxious stimuli. No response to voice. CN: Bilateral pupils 5 mm dilated and sluggishly reactive. Face flaccidly symmetric (on Versed and propofol)  Motor/Sensory: Flaccid x 4. No movement to any stimuli.  Reflexes: 0 in upper and lower extremities.   Lab Results: Results for orders placed or performed during the hospital encounter of 03/28/19 (from the past 48 hour(s))  MRSA PCR Screening     Status: None   Collection Time: 03/29/19 10:36 AM   Specimen: Nasal Mucosa; Nasopharyngeal  Result Value Ref Range   MRSA by PCR NEGATIVE NEGATIVE    Comment:        The GeneXpert MRSA Assay (FDA approved for NASAL specimens only), is one component of a comprehensive MRSA colonization surveillance program. It is  not intended to diagnose MRSA infection nor to guide or monitor treatment for MRSA infections. Performed at [facility], [place] [address], [place], [place] [address]   Culture, blood (single)     Status: None (Preliminary result)   Collection Time: 03/29/19 12:06 PM   Specimen: BLOOD  Result Value Ref Range   Specimen Description      BLOOD RIGHT ARM Performed at [facility] [address], [place], [place] [address]    Special Requests      BOTTLES DRAWN AEROBIC AND ANAEROBIC Blood Culture adequate volume Performed at [facility] [address], [place], [place] [address]    Culture      NO GROWTH 2 DAYS Performed at [facility], [place] [address], [place], [place] [address]    Report Status PENDING   Procalcitonin - Baseline     Status: None   Collection Time: 03/29/19 12:06 PM  Result Value Ref Range   Procalcitonin <0.10 ng/mL    Comment:        Interpretation: PCT (Procalcitonin) <= 0.5 ng/mL: Systemic infection (sepsis) is not likely. Local bacterial infection is possible. (NOTE)       Sepsis PCT Algorithm           Lower Respiratory Tract  Infection PCT Algorithm    ----------------------------     ----------------------------         PCT < 0.25 ng/mL                PCT < 0.10 ng/mL         Strongly encourage             Strongly discourage   discontinuation of antibiotics    initiation of antibiotics    ----------------------------     -----------------------------       PCT 0.25 - 0.50 ng/mL            PCT 0.10 - 0.25 ng/mL               OR       >80% decrease in PCT            Discourage initiation of                                            antibiotics      Encourage discontinuation           of antibiotics    ----------------------------     -----------------------------         PCT >= 0.50 ng/mL              PCT 0.26 - 0.50 ng/mL               AND         <80% decrease in PCT             Encourage initiation of                                             antibiotics       Encourage continuation           of antibiotics    ----------------------------     -----------------------------        PCT >= 0.50 ng/mL                  PCT > 0.50 ng/mL               AND         increase in PCT                  Strongly encourage                                      initiation of antibiotics    Strongly encourage escalation           of antibiotics                                     -----------------------------                                           PCT <= 0.25 ng/mL  OR                                        > 80% decrease in PCT                                     Discontinue / Do not initiate                                             antibiotics Performed at [facility] [address], [place], [place] [address]   Lactic acid, plasma     Status: Abnormal   Collection Time: 03/29/19 12:06 PM  Result Value Ref Range   Lactic Acid, Venous 4.1 (HH) 0.5 - 1.9 mmol/L    Comment: CRITICAL RESULT CALLED TO, READ BACK BY AND VERIFIED WITH: MATHAI,E. RN AT 1247 03/29/19 MULLINS,T Performed at [facility], [place] [address] [place], [place] [address]   Basic metabolic panel     Status: Abnormal   Collection Time: 03/29/19  6:16 PM  Result Value Ref Range   Sodium 142 135 - 145 mmol/L   Potassium 3.1 (L) 3.5 - 5.1 mmol/L    Comment: DELTA CHECK NOTED   Chloride 105 98 - 111 mmol/L   CO2 29 22 - 32 mmol/L   Glucose, Bld 110 (H) 70 - 99 mg/dL   BUN 11 6 - 20 mg/dL   Creatinine, Ser 0.65 0.44 - 1.00 mg/dL   Calcium 8.2 (L) 8.9 - 10.3 mg/dL   GFR calc non Af Amer >60 >60 mL/min   GFR calc Af Amer >60 >60 mL/min   Anion gap 8 5 - 15    Comment: Performed at [facility], [place] [address], [place], [place] [address]  CBC with  Differential/Platelet     Status: Abnormal   Collection Time: 03/29/19  6:16 PM  Result Value Ref Range   WBC 9.1 4.0 - 10.5 K/uL   RBC 4.02 3.87 - 5.11 MIL/uL   Hemoglobin 12.2 12.0 - 15.0 g/dL   HCT 37.1 36.0 - 46.0 %   MCV 92.3 80.0 - 100.0 fL   MCH 30.3 26.0 - 34.0 pg   MCHC 32.9 30.0 - 36.0 g/dL   RDW 12.0 11.5 - 15.5 %   Platelets 228 150 - 400 K/uL   nRBC 0.0 0.0 - 0.2 %   Neutrophils Relative % 90 %   Neutro Abs 8.2 (H) 1.7 - 7.7 K/uL   Lymphocytes Relative 5 %   Lymphs Abs 0.5 (L) 0.7 - 4.0 K/uL   Monocytes Relative 5 %   Monocytes Absolute 0.5 0.1 - 1.0 K/uL   Eosinophils Relative 0 %   Eosinophils Absolute 0.0 0.0 - 0.5 K/uL   Basophils Relative 0 %   Basophils Absolute 0.0 0.0 - 0.1 K/uL   Immature Granulocytes 0 %   Abs Immature Granulocytes 0.03 0.00 - 0.07 K/uL    Comment: Performed at [facility], [place] [address], [place], [place] [address]  Lactic acid, plasma     Status: Abnormal   Collection Time: 03/29/19  6:16 PM  Result Value Ref Range   Lactic Acid, Venous 3.7 (HH)  0.5 - 1.9 mmol/L    Comment: CRITICAL VALUE NOTED.  VALUE IS CONSISTENT WITH PREVIOUSLY REPORTED AND CALLED VALUE. Performed at [facility], [place] [address], [place], [place] [address]   Blood gas, arterial     Status: Abnormal   Collection Time: 03/29/19  8:03 PM  Result Value Ref Range   FIO2 80.00    pH, Arterial 7.404 7.350 - 7.450   pCO2 arterial 51.9 (H) 32.0 - 48.0 mmHg   pO2, Arterial 136 (H) 83.0 - 108.0 mmHg   Bicarbonate 31.8 (H) 20.0 - 28.0 mmol/L   Patient temperature 98.6    Allens test (pass/fail) PASS PASS    Comment: Performed at [facility], [place] [address] [place], [place] [address]  Dilantin (Phenytoin) level     Status: Abnormal   Collection Time: 03/29/19  9:45 PM  Result Value Ref Range   Phenytoin Lvl 21.3 (H) 10.0 - 20.0 ug/mL    Comment: Performed at [facility] [address] [place],  [place] [address]  Lithium level     Status: Abnormal   Collection Time: 03/29/19  9:45 PM  Result Value Ref Range   Lithium Lvl <0.06 (L) 0.60 - 1.20 mmol/L    Comment: Performed at [facility] [address] [place], [place] [address]  Ammonia     Status: None   Collection Time: 03/29/19  9:45 PM  Result Value Ref Range   Ammonia 31 9 - 35 umol/L    Comment: Performed at [facility], [place] [address], [place], [place] [address]  I-STAT 7, (LYTES, BLD GAS, ICA, H+H)     Status: Abnormal   Collection Time: 03/29/19  9:51 PM  Result Value Ref Range   pH, Arterial 7.309 (L) 7.350 - 7.450   pCO2 arterial 69.5 (HH) 32.0 - 48.0 mmHg   pO2, Arterial 203.0 (H) 83.0 - 108.0 mmHg   Bicarbonate 34.0 (H) 20.0 - 28.0 mmol/L   TCO2 36 (H) 22 - 32 mmol/L   O2 Saturation 100.0 %   Acid-Base Excess 7.0 (H) 0.0 - 2.0 mmol/L   Sodium 152 (H) 135 - 145 mmol/L   Potassium 3.6 3.5 - 5.1 mmol/L   Calcium, Ion 1.09 (L) 1.15 - 1.40 mmol/L   HCT 35.0 (L) 36.0 - 46.0 %   Hemoglobin 11.9 (L) 12.0 - 15.0 g/dL   Patient temperature 103.2 F    Collection site RADIAL, ALLEN'S TEST ACCEPTABLE    Drawn by RT    Sample type ARTERIAL    Comment NOTIFIED PHYSICIAN   Triglycerides     Status: None   Collection Time: 03/29/19  9:56 PM  Result Value Ref Range   Triglycerides 144 <150 mg/dL    Comment: Performed at [facility], [place] [address], [place], [place] [address]  Acetaminophen level     Status: Abnormal   Collection Time: 03/29/19 11:30 PM  Result Value Ref Range   Acetaminophen (Tylenol), Serum <10 (L) 10 - 30 ug/mL    Comment: (NOTE) Therapeutic concentrations vary significantly. A range of 10-30 ug/mL  may be an effective concentration for many patients. However, some  are best treated at concentrations outside of this range. Acetaminophen concentrations >150 ug/mL at 4 hours after ingestion  and >50 ug/mL at 12 hours after ingestion are often associated with  toxic reactions. Performed  at [facility], [place] [address] [place], [place] [address]   Lithium level  Status: Abnormal   Collection Time: 03/30/19  5:24 AM  Result Value Ref Range   Lithium Lvl 0.14 (L) 0.60 - 1.20 mmol/L    Comment: Performed at [facility] [address], [place], [place] [address]  Lactic acid, plasma     Status: Abnormal   Collection Time: 03/30/19  5:24 AM  Result Value Ref Range   Lactic Acid, Venous 4.2 (HH) 0.5 - 1.9 mmol/L    Comment: CRITICAL RESULT CALLED TO, READ BACK BY AND VERIFIED WITH: RN Sandrea Matte _0  03/30/19 BY S GEZAHEGN Performed at [facility], [place] [address] [place], [place] [address]   I-STAT 7, (LYTES, BLD GAS, ICA, H+H)     Status: Abnormal   Collection Time: 03/30/19  6:25 AM  Result Value Ref Range   pH, Arterial 7.416 7.350 - 7.450   pCO2 arterial 39.7 32.0 - 48.0 mmHg   pO2, Arterial 188.0 (H) 83.0 - 108.0 mmHg   Bicarbonate 25.5 20.0 - 28.0 mmol/L   TCO2 27 22 - 32 mmol/L   O2 Saturation 100.0 %   Acid-Base Excess 1.0 0.0 - 2.0 mmol/L   Sodium 147 (H) 135 - 145 mmol/L   Potassium 3.2 (L) 3.5 - 5.1 mmol/L   Calcium, Ion 1.05 (L) 1.15 - 1.40 mmol/L   HCT 32.0 (L) 36.0 - 46.0 %   Hemoglobin 10.9 (L) 12.0 - 15.0 g/dL   Patient temperature 37.0 C    Collection site ARTERIAL LINE    Drawn by Operator    Sample type ARTERIAL   CK     Status: Abnormal   Collection Time: 03/30/19  8:52 AM  Result Value Ref Range   Total CK 413 (H) 38 - 234 U/L    Comment: Performed at [facility], [address] [place], [place] [address]  Hepatic function panel     Status: Abnormal   Collection Time: 03/30/19  8:52 AM  Result Value Ref Range   Total Protein 5.1 (L) 6.5 - 8.1 g/dL    Comment: POST-ULTRACENTRIFUGATION   Albumin 2.8 (L) 3.5 - 5.0 g/dL    Comment: POST-ULTRACENTRIFUGATION   AST 248 (H) 15 - 41 U/L    Comment: POST-ULTRACENTRIFUGATION   ALT 150 (H) 0 - 44 U/L    Comment: POST-ULTRACENTRIFUGATION   Alkaline Phosphatase 56 38  - 126 U/L   Total Bilirubin 1.2 0.3 - 1.2 mg/dL    Comment: POST-ULTRACENTRIFUGATION   Bilirubin, Direct 0.7 (H) 0.0 - 0.2 mg/dL    Comment: POST-ULTRACENTRIFUGATION   Indirect Bilirubin 0.5 0.3 - 0.9 mg/dL    Comment: Performed at [facility] [address] [place], [place] [address]  Comprehensive metabolic panel     Status: Abnormal   Collection Time: 03/30/19 10:00 AM  Result Value Ref Range   Sodium 143 135 - 145 mmol/L    Comment: POST-ULTRACENTRIFUGATION   Potassium 4.2 3.5 - 5.1 mmol/L    Comment: HEMOLYSIS AT THIS LEVEL MAY AFFECT RESULT   Chloride 112 (H) 98 - 111 mmol/L   CO2 24 22 - 32 mmol/L   Glucose, Bld 110 (H) 70 - 99 mg/dL   BUN 8 6 - 20 mg/dL   Creatinine, Ser 0.72 0.44 - 1.00 mg/dL   Calcium 7.3 (L) 8.9 - 10.3 mg/dL   Total Protein 4.7 (L) 6.5 - 8.1 g/dL   Albumin 2.7 (L) 3.5 - 5.0 g/dL   AST 197 (H) 15 - 41 U/L   ALT  136 (H) 0 - 44 U/L   Alkaline Phosphatase 56 38 - 126 U/L   Total Bilirubin 1.1 0.3 - 1.2 mg/dL   GFR calc non Af Amer >60 >60 mL/min   GFR calc Af Amer >60 >60 mL/min   Anion gap 7 5 - 15    Comment: Performed at [facility] [address], [place], [place] [address]  CBC     Status: Abnormal   Collection Time: 03/30/19 10:00 AM  Result Value Ref Range   WBC 10.5 4.0 - 10.5 K/uL   RBC 3.48 (L) 3.87 - 5.11 MIL/uL   Hemoglobin 11.9 (L) 12.0 - 15.0 g/dL   HCT 32.7 (L) 36.0 - 46.0 %   MCV 94.0 80.0 - 100.0 fL   MCH 34.2 (H) 26.0 - 34.0 pg   MCHC 36.4 (H) 30.0 - 36.0 g/dL   RDW 12.9 11.5 - 15.5 %   Platelets 269 150 - 400 K/uL   nRBC 0.0 0.0 - 0.2 %    Comment: Performed at [facility] [address] [place], [place] [address]  Magnesium     Status: Abnormal   Collection Time: 03/30/19 10:04 AM  Result Value Ref Range   Magnesium 1.6 (L) 1.7 - 2.4 mg/dL    Comment: HEMOLYSIS AT THIS LEVEL MAY AFFECT RESULT Performed at [facility] [address] [place], [place] [address]   Phosphorus     Status:  Abnormal   Collection Time: 03/30/19 10:04 AM  Result Value Ref Range   Phosphorus 1.9 (L) 2.5 - 4.6 mg/dL    Comment: POST-ULTRACENTRIFUGATION Performed at [facility] [address] [place], [place] [address]   Lithium level     Status: Abnormal   Collection Time: 03/30/19  1:00 PM  Result Value Ref Range   Lithium Lvl <0.06 (L) 0.60 - 1.20 mmol/L    Comment: LIPEMIC SPECIMEN Performed at [facility] [address] [place], [place] [address]   Acetaminophen level     Status: Abnormal   Collection Time: 03/30/19  1:00 PM  Result Value Ref Range   Acetaminophen (Tylenol), Serum <10 (L) 10 - 30 ug/mL    Comment: (NOTE) Therapeutic concentrations vary significantly. A range of 10-30 ug/mL  may be an effective concentration for many patients. However, some  are best treated at concentrations outside of this range. Acetaminophen concentrations >150 ug/mL at 4 hours after ingestion  and >50 ug/mL at 12 hours after ingestion are often associated with  toxic reactions. Performed at [facility], [place] [address] [place], [place] [address]   Glucose, capillary     Status: None   Collection Time: 03/30/19  1:54 PM  Result Value Ref Range   Glucose-Capillary 92 70 - 99 mg/dL  Glucose, capillary     Status: None   Collection Time: 03/30/19  4:30 PM  Result Value Ref Range   Glucose-Capillary 78 70 - 99 mg/dL  Glucose, capillary     Status: Abnormal   Collection Time: 03/30/19  7:19 PM  Result Value Ref Range   Glucose-Capillary 68 (L) 70 - 99 mg/dL  Glucose, capillary     Status: None   Collection Time: 03/30/19  7:43 PM  Result Value Ref Range   Glucose-Capillary 84 70 - 99 mg/dL  Magnesium     Status: None   Collection Time: 03/30/19  9:20 PM  Result Value Ref Range   Magnesium 2.1 1.7 -  2.4 mg/dL    Comment: Performed at [facility], [place] [address], [place], [place] [address]  Phosphorus     Status: None   Collection Time: 03/30/19  9:20 PM   Result Value Ref Range   Phosphorus 2.9 2.5 - 4.6 mg/dL    Comment: Performed at [facility] [address] [place], [place] [address]  Glucose, capillary     Status: None   Collection Time: 03/30/19 11:10 PM  Result Value Ref Range   Glucose-Capillary 80 70 - 99 mg/dL  Glucose, capillary     Status: None   Collection Time: 03/31/19  3:28 AM  Result Value Ref Range   Glucose-Capillary 81 70 - 99 mg/dL  Basic metabolic panel     Status: Abnormal   Collection Time: 03/31/19  4:44 AM  Result Value Ref Range   Sodium 141 135 - 145 mmol/L   Potassium 3.8 3.5 - 5.1 mmol/L   Chloride 110 98 - 111 mmol/L   CO2 23 22 - 32 mmol/L   Glucose, Bld 105 (H) 70 - 99 mg/dL   BUN 10 6 - 20 mg/dL   Creatinine, Ser 0.52 0.44 - 1.00 mg/dL   Calcium 8.2 (L) 8.9 - 10.3 mg/dL   GFR calc non Af Amer >60 >60 mL/min   GFR calc Af Amer >60 >60 mL/min   Anion gap 8 5 - 15    Comment: Performed at [facility], [place] [address], [place], [place] [address]  CBC     Status: Abnormal   Collection Time: 03/31/19  4:44 AM  Result Value Ref Range   WBC 11.9 (H) 4.0 - 10.5 K/uL   RBC 3.40 (L) 3.87 - 5.11 MIL/uL   Hemoglobin 10.2 (L) 12.0 - 15.0 g/dL   HCT 31.6 (L) 36.0 - 46.0 %   MCV 92.9 80.0 - 100.0 fL   MCH 30.0 26.0 - 34.0 pg   MCHC 32.3 30.0 - 36.0 g/dL   RDW 13.1 11.5 - 15.5 %   Platelets 169 150 - 400 K/uL   nRBC 0.0 0.0 - 0.2 %    Comment: Performed at [facility], [place] [address] [place], [place] [address]  Magnesium     Status: None   Collection Time: 03/31/19  4:44 AM  Result Value Ref Range   Magnesium 1.9 1.7 - 2.4 mg/dL    Comment: Performed at [facility] [address], [place], [place] [address]  Phosphorus     Status: Abnormal   Collection Time: 03/31/19  4:44 AM  Result Value Ref Range   Phosphorus 2.3 (L) 2.5 - 4.6 mg/dL    Comment: Performed at [facility] [address] [place], [place] [address]    Recent Results (from the past 240 hour(s))   Respiratory Panel by RT PCR (Flu A&B, Covid) - Nasopharyngeal Swab     Status: None   Collection Time: 03/28/19 10:42 PM   Specimen: Nasopharyngeal Swab  Result Value Ref Range Status   SARS Coronavirus 2 by RT PCR NEGATIVE NEGATIVE Final    Comment: (NOTE) SARS-CoV-2 target nucleic acids are NOT DETECTED. The SARS-CoV-2 RNA is generally detectable in upper respiratoy specimens during the acute phase of infection. The lowest concentration of SARS-CoV-2 viral copies this assay can detect is 131 copies/mL. A negative result does not preclude SARS-Cov-2 infection and should not be used as the sole basis for treatment or other patient management decisions. A  negative result may occur with  improper specimen collection/handling, submission of specimen other than nasopharyngeal swab, presence of viral mutation(s) within the areas targeted by this assay, and inadequate number of viral copies (<131 copies/mL). A negative result must be combined with clinical observations, patient history, and epidemiological information. The expected result is Negative. Fact Sheet for Patients:  PinkCheek.be Fact Sheet for Healthcare Providers:  GravelBags.it This test is not yet ap proved or cleared by the [place] FDA and  has been authorized for detection and/or diagnosis of SARS-CoV-2 by FDA under an Emergency Use Authorization (EUA). This EUA will remain  in effect (meaning this test can be used) for the duration of the COVID-19 declaration under Section 564(b)(1) of the Act, 21 U.S.C. section 360bbb-3(b)(1), unless the authorization is terminated or revoked sooner.    Influenza A by PCR NEGATIVE NEGATIVE Final   Influenza B by PCR NEGATIVE NEGATIVE Final    Comment: (NOTE) The Xpert Xpress SARS-CoV-2/FLU/RSV assay is intended as an aid in  the diagnosis of influenza from Nasopharyngeal swab specimens and  should not be used as a sole  basis for treatment. Nasal washings and  aspirates are unacceptable for Xpert Xpress SARS-CoV-2/FLU/RSV  testing. Fact Sheet for Patients: PinkCheek.be Fact Sheet for Healthcare Providers: GravelBags.it This test is not yet approved or cleared by the [place] FDA and  has been authorized for detection and/or diagnosis of SARS-CoV-2 by  FDA under an Emergency Use Authorization (EUA). This EUA will remain  in effect (meaning this test can be used) for the duration of the  Covid-19 declaration under Section 564(b)(1) of the Act, 21  U.S.C. section 360bbb-3(b)(1), unless the authorization is  terminated or revoked. Performed at [facility], [place] [address], [place], [place] [address]   MRSA PCR Screening     Status: None   Collection Time: 03/29/19 10:36 AM   Specimen: Nasal Mucosa; Nasopharyngeal  Result Value Ref Range Status   MRSA by PCR NEGATIVE NEGATIVE Final    Comment:        The GeneXpert MRSA Assay (FDA approved for NASAL specimens only), is one component of a comprehensive MRSA colonization surveillance program. It is not intended to diagnose MRSA infection nor to guide or monitor treatment for MRSA infections. Performed at [facility], [place] [address], [place], [place] [address]   Culture, blood (single)     Status: None (Preliminary result)   Collection Time: 03/29/19 12:06 PM   Specimen: BLOOD  Result Value Ref Range Status   Specimen Description   Final    BLOOD RIGHT ARM Performed at [facility] [address], [place], [place] [address]    Special Requests   Final    BOTTLES DRAWN AEROBIC AND ANAEROBIC Blood Culture adequate volume Performed at [facility] [address] [place], [place] [address]    Culture   Final    NO GROWTH 2 DAYS Performed at [facility] [address], [place], [place] [address]     Report Status PENDING  Incomplete    Lipid Panel Recent Labs    03/29/19 2156  TRIG 144    Studies/Results: DG Abd 1 View  Result Date: 03/29/2019 CLINICAL DATA:  Evaluate enteric tube placement. EXAM: ABDOMEN - 1 VIEW COMPARISON:  None FINDINGS: Interval placement of enteric tube. The tip and side port of the tube are both well below the GE junction. Tip of the tube projects over the expected  location of the gastric body. IMPRESSION: 1. Satisfactory position of enteric tube. Electronically Signed   By: Kerby Moors M.D.   On: 03/29/2019 19:20   CT HEAD WO CONTRAST  Result Date: 03/30/2019 CLINICAL DATA:  Seizure EXAM: CT HEAD WITHOUT CONTRAST TECHNIQUE: Contiguous axial images were obtained from the base of the skull through the vertex without intravenous contrast. COMPARISON:  03/28/2019 FINDINGS: Brain: Extensive streak artifact from EEG electrodes. There is no mass, hemorrhage or extra-axial collection. The size and configuration of the ventricles and extra-axial CSF spaces are normal. The brain parenchyma is normal, without acute or chronic infarction. Vascular: No abnormal hyperdensity of the major intracranial arteries or dural venous sinuses. No intracranial atherosclerosis. Skull: The visualized skull base, calvarium and extracranial soft tissues are normal. Sinuses/Orbits: No fluid levels or advanced mucosal thickening of the visualized paranasal sinuses. No mastoid or middle ear effusion. The orbits are normal. IMPRESSION: Extensive streak artifact from EEG electrodes. No visible acute intracranial abnormality. Electronically Signed   By: Ulyses Jarred M.D.   On: 03/30/2019 02:04   DG CHEST PORT 1 VIEW  Result Date: 03/29/2019 CLINICAL DATA:  Clotted central line. EXAM: PORTABLE CHEST 1 VIEW COMPARISON:  March 29, 2019 FINDINGS: There is stable endotracheal tube and nasogastric tube positioning. Since the prior study there is been interval placement of a right internal jugular venous  catheter with its distal tip seen just beyond the junction of the superior vena cava and right atrium. Mild atelectasis and/or infiltrate is seen within the right lung base. This represents a new finding when compared to the prior study. There is no evidence of a pleural effusion or pneumothorax. The heart size and mediastinal contours are within normal limits. The visualized skeletal structures are unremarkable. IMPRESSION: 1. Interval right internal jugular venous catheter placement and positioning, as described above, when compared to the prior chest plain film dated March 29, 2019 (acquired at 6:35 p.m.). 2. Mild right basilar atelectasis and/or infiltrate which represents a new finding when compared to the prior exam. Electronically Signed   By: Virgina Norfolk M.D.   On: 03/29/2019 23:37   Portable Chest x-ray  Result Date: 03/29/2019 CLINICAL DATA:  Encounter for ET tube and OG tube placement EXAM: PORTABLE CHEST 1 VIEW COMPARISON:  Earlier today FINDINGS: Interval placement of ET tube with tip above carina. There is an enteric tube with tip and side port well below the level of the GE junction. The heart size appears within normal limits. No pleural effusion or edema. No airspace densities. IMPRESSION: 1. Satisfactory position of ET tube and enteric tube. 2. Lungs clear. Electronically Signed   By: Kerby Moors M.D.   On: 03/29/2019 19:18   EEG adult  Result Date: 03/29/2019 Lora Havens, MD     03/29/2019  1:28 PM Patient Name: Carmen Daniels MRN: [id number] Epilepsy Attending: Lora Havens Referring Physician/Provider: Dr Kara Mead Date: 03/29/2019 Duration: 25.04 mins Patient history: 34 yo F who presented with ams. Likely suicidal overdose with dilated pupils and intermittent myoclonus-like activity.  EEG to evaluate for seizures. Level of alertness: Lethargic AEDs during EEG study: Lorazepam Technical aspects: This EEG study was done with scalp electrodes positioned according to the  10-20 International system of electrode placement. Electrical activity was acquired at a sampling rate of _0  and reviewed with a high frequency filter of _1  and a low frequency filter of _2 . EEG data were recorded continuously and digitally stored. Description: No clear posterior dominant rhythm was  seen.  EEG showed continuous generalized mixed frequencies including 3 to 6 Hz theta-delta slowing as well as 8 to 9 Hz generalized alpha activity and intermittent 13 to 15 Hz generalized beta activity, maximal frontocentral. EEG was reactive to noxious stimulation.  Hyperventilation photic summation were not performed due to AMS. Abnormality -Continuous slow, generalized IMPRESSION: This study is suggestive of severe diffuse encephalopathy, nonspecific to etiology. No seizures or epileptiform discharges were seen throughout the recording. Priyanka [name]   Overnight EEG with video  Result Date: 03/30/2019 Lora Havens, MD     03/30/2019  9:04 AM Patient Name: Carmen Daniels MRN: [id number] Epilepsy Attending: Lora Havens Referring Physician/Provider: Dr. Kathrynn Speed Duration: 03/29/2019 2315  To 03/30/2019 0900 Patient history: 34 yo F who presented with ams. Likely suicidal overdose with dilated pupils and intermittent myoclonus-like activity.  EEG to evaluate for seizures Level of alertness: comatose/sedated AEDs during EEG study: propofol, versed, keppra, dilantin Technical aspects: This EEG study was done with scalp electrodes positioned according to the 10-20 International system of electrode placement. Electrical activity was acquired at a sampling rate of '500Hz'$  and reviewed with a high frequency filter of '70Hz'$  and a low frequency filter of '1Hz'$ . EEG data were recorded continuously and digitally stored. Description: EEG initially showed continuous generalized low amplitude 23 Hz delta slowing with intermittent (0.25 Hz) right anterior temporal sharp waves.  Gradually after midnight eeg showed  continuous generalized 2 to 3 Hz delta activity with overriding 15 to 18 Hz, 2-3 uV beta activity with irregular morphology distributed symmetrically and diffusely.   Hyperventilation and photic stimulation were not performed. Abnormality -Sharp wave,  Right anterior temporal region -Continuous slow, generalized -Excessive beta, generalized IMPRESSION: This study initially showed evidence of potential epileptogenicity from the right anterior temporal region.  After around midnight, EEG showed profound diffuse encephalopathy, nonspecific to etiology but most likely secondary to sedation. The excessive beta activity seen in the background is most likely due to the effect of benzodiazepine and is a benign EEG pattern. No seizures were seen throughout the recording. EEG appears significantly improved compared to routine EEG done prior to the study. Lora Havens   ECHOCARDIOGRAM COMPLETE  Result Date: 03/30/2019   ECHOCARDIOGRAM REPORT   Patient Name:   Carmen Daniels Date of Exam: 03/30/2019 Medical Rec #:  [id number]      Height:       70.0 in Accession #:    [id number]     Weight:       187.2 lb Date of Birth:  1986/01/08       BSA:          2.03 m Patient Age:    33 years       BP:           113/51 mmHg Patient Gender: F              HR:           74 bpm. Exam Location:  Inpatient Procedure: 2D Echo Indications:    shock  History:        Patient has prior history of Echocardiogram examinations, most                 recent 07/03/2018. Risk Factors:overdose.  Sonographer:    Johny Chess Referring Phys: [id number] MANUEL E IZQUIERDO  Sonographer Comments: Echo performed with patient supine and on artificial respirator. Image acquisition challenging due to respiratory motion. IMPRESSIONS  1. Left ventricular  ejection fraction, by visual estimation, is 55 to 60%. The left ventricle has normal function. There is no left ventricular hypertrophy. Normal diastolic function  2. Global right ventricle has normal systolic  function.The right ventricular size is normal.  3. Left atrial size was normal.  4. Right atrial size was normal.  5. The mitral valve is normal in structure. No evidence of mitral valve regurgitation.  6. The tricuspid valve is normal in structure. Tricuspid valve regurgitation is trivial.  7. The aortic valve is tricuspid. Aortic valve regurgitation is not visualized. No evidence of aortic valve sclerosis or stenosis.  8. The pulmonic valve was not well visualized. Pulmonic valve regurgitation is not visualized.  9. Normal pulmonary artery systolic pressure. FINDINGS  Left Ventricle: Left ventricular ejection fraction, by visual estimation, is 55 to 60%. The left ventricle has normal function. The left ventricle has no regional wall motion abnormalities. The left ventricular internal cavity size was the left ventricle is normal in size. There is no left ventricular hypertrophy. Left ventricular diastolic parameters were normal. Right Ventricle: The right ventricular size is normal. No increase in right ventricular wall thickness. Global RV systolic function is has normal systolic function. The tricuspid regurgitant velocity is 2.02 m/s, and with an assumed right atrial pressure  of 3 mmHg, the estimated right ventricular systolic pressure is normal at 19.3 mmHg. Left Atrium: Left atrial size was normal in size. Right Atrium: Right atrial size was normal in size Pericardium: There is no evidence of pericardial effusion. Mitral Valve: The mitral valve is normal in structure. No evidence of mitral valve regurgitation. Tricuspid Valve: The tricuspid valve is normal in structure. Tricuspid valve regurgitation is trivial. Aortic Valve: The aortic valve is tricuspid. Aortic valve regurgitation is not visualized. The aortic valve is structurally normal, with no evidence of sclerosis or stenosis. Pulmonic Valve: The pulmonic valve was not well visualized. Pulmonic valve regurgitation is not visualized. Pulmonic  regurgitation is not visualized. Aorta: The aortic root is normal in size and structure. Venous: IVC assessment for right atrial pressure unable to be performed due to mechanical ventilation. IAS/Shunts: No atrial level shunt detected by color flow Doppler.  LEFT VENTRICLE PLAX 2D LVIDd:         4.50 cm  Diastology LVIDs:         3.30 cm  LV e' lateral:   20.70 cm/s LV PW:         0.90 cm  LV E/e' lateral: 5.7 LV IVS:        0.80 cm  LV e' medial:    13.90 cm/s LVOT diam:     2.10 cm  LV E/e' medial:  8.6 LV SV:         48 ml LV SV Index:   23.43 LVOT Area:     3.46 cm  RIGHT VENTRICLE RV S prime:     15.70 cm/s TAPSE (M-mode): 1.6 cm LEFT ATRIUM             Index       RIGHT ATRIUM           Index LA diam:        2.50 cm 1.23 cm/m  RA Area:     11.60 cm LA Vol (A2C):   42.0 ml 20.70 ml/m RA Volume:   24.70 ml  12.17 ml/m LA Vol (A4C):   30.9 ml 15.23 ml/m LA Biplane Vol: 37.9 ml 18.68 ml/m  AORTIC VALVE LVOT Vmax:   105.00 cm/s  LVOT Vmean:  69.300 cm/s LVOT VTI:    0.207 m  AORTA Ao Root diam: 3.00 cm MITRAL VALVE                         TRICUSPID VALVE MV Area (PHT): 3.91 cm              TR Peak grad:   16.3 mmHg MV PHT:        56.26 msec            TR Vmax:        202.00 cm/s MV Decel Time: 194 msec MV E velocity: 119.00 cm/s 103 cm/s  SHUNTS MV A velocity: 61.50 cm/s  70.3 cm/s Systemic VTI:  0.21 m MV E/A ratio:  1.93        1.5       Systemic Diam: 2.10 cm  Oswaldo Milian MD Electronically signed by Oswaldo Milian MD Signature Date/Time: 03/30/2019/12:59:56 PM    Final     Medications:  Scheduled: . chlorhexidine gluconate (MEDLINE KIT)  15 mL Mouth Rinse BID  . Chlorhexidine Gluconate Cloth  6 each Topical Daily  . enoxaparin (LOVENOX) injection  40 mg Subcutaneous Daily  . feeding supplement (PRO-STAT SUGAR FREE 64)  30 mL Per Tube BID  . mouth rinse  15 mL Mouth Rinse 10 times per day  . pantoprazole sodium  40 mg Per Tube Q24H  . sodium chloride flush  10-40 mL Intracatheter  Q12H   Continuous: . sodium chloride    . clindamycin (CLEOCIN) IV 100 mL/hr at 03/31/19 0603  . dexmedetomidine (PRECEDEX) IV infusion    . dextrose 5% lactated ringers 100 mL/hr at 03/31/19 0603  . feeding supplement (VITAL AF 1.2 CAL) 45 mL/hr at 03/31/19 0500  . levETIRAcetam Stopped (03/30/19 2146)  . midazolam 30 mg/hr (03/31/19 0603)  . norepinephrine (LEVOPHED) Adult infusion 1 mcg/min (03/31/19 0603)  . propofol (DIPRIVAN) infusion 50 mcg/kg/min (03/31/19 0603)  . sodium bicarbonate 150 mEq in dextrose 5% 1000 mL Stopped (03/29/19 2155)    CT head without contrast 03/30/2019: No acute abnormality.  LTM EEG report for this morning: Findings: Continuous slow, generalized. Excessive beta, generalized Impression: This study showed profound diffuse encephalopathy, nonspecific to etiology but most likely secondary to sedation. The excessive beta activity seen in the background is most likely due to the effect of benzodiazepine and is a benign EEG pattern. No seizures were seen throughout the recording.  Assessment: 34 year old female who presented after suicidal overdose on Wellbutrin.  Was noted to be in status epilepticus overnight and started on Versed and propofol. Status epilepticus (resolved) with provoked seizures Suicidal overdose Acute toxic metabolic encephalopathy Acute respiratory failure Bipolar disorder Right hand cellulitis Hypertension Prolonged QT interval Anemia Hypoalbuminemia Transaminitis Hyperbilirubinemia Elevated CK -Status epilepticus in the setting of Wellbutrin overdose, no further seizures since starting Versed and propofol. -LTM EEG showed no seizures overnight, with findings consistent with sedation.   Recommendations -She has been on sedation for > 24 hours, with no electrographic seizures seen on LTM EEG. Midazolam has been running since 2147 on 2/4. Starting to wean Versed and propofol sedation today by 5 mg/hr and 5 mcg/kg/hr, respectively.  Neurology team will continue to monitor LTM EEG.    - Continue Keppra 1500 mg twice daily.  If patient has any further seizures, consider adding valproic acid. - Continue LTM EEG  -We will obtain MRI brain without contrast once EEG is disconnected, likely on  Monday -Anticipate patient will not require long-term antiepileptics as these are likely provoked seizures in setting of overdose. -Continue seizure precautions -As needed IV midazolam 5 mg bolus for clinical seizure-like activity -Management of other comorbidities per primary team  40 minutes spent in the neurological evaluation and management of this critically ill patient. Time spent included review of EEG and coordination of care.    LOS: 2 days   _0  signed: Dr. Kerney Elbe 03/31/2019  7:37 AM

## 2019-03-31 NOTE — Progress Notes (Signed)
[place] Progress Note Patient Name: Carmen Daniels DOB: 03-25-1985 MRN: [id number]   Date of Service  03/31/2019  HPI/Events of Note  RN requests restraints due to patient reaching for ETT in setting of weaning of sedation.   eICU Interventions  Restraints (soft b/l wrist) ordered to meet patient's safety goal.     Intervention Category Minor Interventions: Agitation / anxiety - evaluation and management  Marily Lente Leeum Sankey 03/31/2019, 8:26 PM

## 2019-03-31 NOTE — Progress Notes (Addendum)
NAME:  Carmen Daniels, MRN:  [id number], DOB:  05-16-1985, LOS: 2 ADMISSION DATE:  03/28/2019, CONSULTATION DATE: 03/29/2019 REFERRING MD:  Dr. Leonette Monarch, ED, CHIEF COMPLAINT:  AMS   Brief History   34 yo female smoker with hx of Bipolar and OCD found with altered mental status from presumed intentional overdose.  Empty bottles of lithium, ambien, flexeril and wellbutrin found. Developed status epilepticus.  Required intubation for airway protection and treatment of status.   Past Medical History  Bipolar, depression, OCD, Cocaine abuse  Significant Hospital Events   2/04 Admit 2/05 start burst suppression 2/6; 5.4/5.3  Consults:  Neurology  Procedures:  ETT 2/04 >> Rt IJ CVL 2/04 >>   Significant Diagnostic Tests:  CT head 2/05 >> no acute findings Echo 2/05 >>   Micro Data:  SARS CoV2 PCR 2/03 >> negative Influenza PCR 2/03 >> negative Blood 2/04 >>  Antimicrobials:  Clindamycin 2/04 >>  Diflucan 2/04 >> 2/4  Interim history/subjective:    Objective   Blood pressure 115/60, pulse 84, temperature 98.6 F (37 C), resp. rate (!) 21, height 5\' 10"  (1.778 m), weight 84.7 kg, SpO2 100 %, not currently breastfeeding.    Vent Mode: PRVC FiO2 (%):  [40 %] 40 % Set Rate:  [16 bmp] 16 bmp Vt Set:  [540 mL] 540 mL PEEP:  [5 cmH20] 5 cmH20 Plateau Pressure:  [16 cmH20-18 cmH20] 16 cmH20   Intake/Output Summary (Last 24 hours) at 03/31/2019 0734 Last data filed at 03/31/2019 [id number] Gross per 24 hour  Intake 5400.46 ml  Output 5350 ml  Net 50.46 ml   Filed Weights   03/29/19 1300 03/30/19 0500 03/31/19 0500  Weight: 83.3 kg 84.9 kg 84.7 kg    Examination:  General - sedated Eyes - pupils dilated, reactive and symmetric ENT - ETT in place Cardiac - regular rate/rhythm, no murmur Chest - equal breath sounds b/l, no wheezing or rales Abdomen - soft, non tender, + bowel sounds Extremities - no cyanosis, clubbing, or edema Skin - no rashes Neuro - RASS -4  Resolved  Hospital Problem list     Assessment & Plan:    Acute metabolic encephalopathy in setting of intentional overdose. Status epilepticus. Intubated for airway protection On Keppra BID Now starting to wean down propofol and versed for status epilepticus per neurology.   Hypotension from sedation. - continue IV fluids - pressors to keep MAP > 65 Currently on levophed 89mcg   Bipolar, depression, OCD. - will need psych assessment when she improves medically  Rt hand cellulitis. - day 3 of clindamycin Cellulitis looks resolved, will do short course Abtx.   Best practice:  Diet: tube feeds DVT prophylaxis: lovenox daily GI prophylaxis: protonix daily Mobility: bed rest Code Status: full code Disposition: ICU  Labs    CMP Latest Ref Rng & Units 03/31/2019 03/30/2019 03/30/2019  Glucose 70 - 99 mg/dL 105(H) 110(H) -  BUN 6 - 20 mg/dL 10 8 -  Creatinine 0.44 - 1.00 mg/dL 0.52 0.72 -  Sodium 135 - 145 mmol/L 141 143 -  Potassium 3.5 - 5.1 mmol/L 3.8 4.2 -  Chloride 98 - 111 mmol/L 110 112(H) -  CO2 22 - 32 mmol/L 23 24 -  Calcium 8.9 - 10.3 mg/dL 8.2(L) 7.3(L) -  Total Protein 6.5 - 8.1 g/dL - 4.7(L) 5.1(L)  Total Bilirubin 0.3 - 1.2 mg/dL - 1.1 1.2  Alkaline Phos 38 - 126 U/L - 56 56  AST 15 - 41 U/L - 197(H)  248(H)  ALT 0 - 44 U/L - 136(H) 150(H)    CBC Latest Ref Rng & Units 03/31/2019 03/30/2019 03/30/2019  WBC 4.0 - 10.5 K/uL 11.9(H) 10.5 -  Hemoglobin 12.0 - 15.0 g/dL 10.2(L) 11.9(L) 10.9(L)  Hematocrit 36.0 - 46.0 % 31.6(L) 32.7(L) 32.0(L)  Platelets 150 - 400 K/uL 169 269 -    ABG    Component Value Date/Time   PHART 7.416 03/30/2019 0625   PCO2ART 39.7 03/30/2019 0625   PO2ART 188.0 (H) 03/30/2019 0625   HCO3 25.5 03/30/2019 0625   TCO2 27 03/30/2019 0625   O2SAT 100.0 03/30/2019 0625    CBG (last 3)  Recent Labs    03/30/19 1943 03/30/19 2310 03/31/19 0328  GLUCAP 84 80 81    CC time 45 min

## 2019-03-31 NOTE — Progress Notes (Signed)
eLink Physician-Brief Progress Note Patient Name: Carmen Daniels DOB: 1985-04-22 MRN: [id number]   Date of Service  03/31/2019  HPI/Events of Note  Notified of Accuchecks in the 80s despite tube feeding  eICU Interventions  Shifted NS to D5LR     Intervention Category Intermediate Interventions: Other:  Judd Lien 03/31/2019, 1:14 AM

## 2019-03-31 NOTE — Progress Notes (Signed)
LTM maint complete - no skin breakdown under:  Fp1, Fp2, CZ, F7

## 2019-03-31 NOTE — Progress Notes (Signed)
2000 - Called Dr. Leonel Ramsay to let him know Patient is following commands and moving purposefully in all extremities while on 20 mcg/hr of propofol. Asked did he still want to wean propofol. He said to hold off until 0300 tomorrow morning then wean by 5 mcg every hour until it is completely off at 7. Will continue to monitor patient.

## 2019-03-31 NOTE — Procedures (Addendum)
Patient Name: Carmen Daniels  MRN: [id number]  Epilepsy Attending: Lora Havens  Referring Physician/Provider: Dr. Kathrynn Speed Duration: 03/30/2019 2315  To 03/31/2019 2315  Patient history: 35 yo F who presented with ams. Likelysuicidal overdose with dilated pupils and intermittent myoclonus-like activity.EEG to evaluate for seizures  Level of alertness: comatose/sedated  AEDs during EEG study: propofol, versed, keppra  Technical aspects: This EEG study was done with scalp electrodes positioned according to the 10-20 International system of electrode placement. Electrical activity was acquired at a sampling rate of 500Hz  and reviewed with a high frequency filter of 70Hz  and a low frequency filter of 1Hz . EEG data were recorded continuously and digitally stored.   Description: EEG showed continuous generalized 2 to 3 Hz delta activity with overriding 15 to 18 Hz, 2-3 uV beta activity with irregular morphology distributed symmetrically and diffusely.   Hyperventilation and photic stimulation were not performed.  Abnormality -Continuous slow, generalized -Excessive beta, generalized  IMPRESSION: This study showed profound diffuse encephalopathy, nonspecific to etiology but most likely secondary to sedation. The excessive beta activity seen in the background is most likely due to the effect of benzodiazepine and is a benign EEG pattern. No seizures were seen throughout the recording.  Darshawn Boateng [name]

## 2019-03-31 NOTE — Progress Notes (Signed)
Wean versed 5mg /hr and propofol  5 mcg/hr/kg per Dr.Lindzen

## 2019-04-01 ENCOUNTER — Inpatient Hospital Stay (HOSPITAL_COMMUNITY): Payer: Medicaid Other

## 2019-04-01 LAB — GLUCOSE, CAPILLARY
Glucose-Capillary: 109 mg/dL — ABNORMAL HIGH (ref 70–99)
Glucose-Capillary: 106 mg/dL — ABNORMAL HIGH (ref 70–99)
Glucose-Capillary: 149 mg/dL — ABNORMAL HIGH (ref 70–99)
Glucose-Capillary: 109 mg/dL — ABNORMAL HIGH (ref 70–99)
Glucose-Capillary: 122 mg/dL — ABNORMAL HIGH (ref 70–99)

## 2019-04-01 LAB — POCT I-STAT 7, (LYTES, BLD GAS, ICA,H+H)
Acid-base deficit: 2 mmol/L (ref 0.0–2.0)
Bicarbonate: 21.7 mmol/L (ref 20.0–28.0)
Calcium, Ion: 1.23 mmol/L (ref 1.15–1.40)
HCT: 30 % — ABNORMAL LOW (ref 36.0–46.0)
Hemoglobin: 10.2 g/dL — ABNORMAL LOW (ref 12.0–15.0)
O2 Saturation: 83 %
Patient temperature: 98.6
Potassium: 3.2 mmol/L — ABNORMAL LOW (ref 3.5–5.1)
Sodium: 148 mmol/L — ABNORMAL HIGH (ref 135–145)
TCO2: 23 mmol/L (ref 22–32)
pCO2 arterial: 31.3 mmHg — ABNORMAL LOW (ref 32.0–48.0)
pH, Arterial: 7.449 (ref 7.350–7.450)
pO2, Arterial: 45 mmHg — ABNORMAL LOW (ref 83.0–108.0)

## 2019-04-01 MED ORDER — DEXMEDETOMIDINE HCL IN NACL 400 MCG/100ML IV SOLN
0.4000 ug/kg/h | INTRAVENOUS | Status: DC
  Administered 2019-04-01: 0.6 ug/kg/h via INTRAVENOUS
  Administered 2019-04-01: 0.4 ug/kg/h via INTRAVENOUS
  Administered 2019-04-02: 0.6 ug/kg/h via INTRAVENOUS
  Filled 2019-04-01 (×3): qty 100

## 2019-04-01 MED ORDER — ONDANSETRON HCL 4 MG/2ML IJ SOLN
4.0000 mg | Freq: Four times a day (QID) | INTRAMUSCULAR | Status: DC
  Administered 2019-04-02 (×2): 4 mg via INTRAVENOUS
  Filled 2019-04-01 (×2): qty 2

## 2019-04-01 MED ORDER — ACETAMINOPHEN 325 MG PO TABS
650.0000 mg | ORAL_TABLET | Freq: Four times a day (QID) | ORAL | Status: DC | PRN
  Administered 2019-04-01 – 2019-04-06 (×3): 650 mg via ORAL
  Filled 2019-04-01 (×3): qty 2

## 2019-04-01 MED ORDER — IPRATROPIUM-ALBUTEROL 0.5-2.5 (3) MG/3ML IN SOLN
3.0000 mL | RESPIRATORY_TRACT | Status: DC | PRN
  Administered 2019-04-01: 23:00:00 3 mL via RESPIRATORY_TRACT
  Filled 2019-04-01: qty 3

## 2019-04-01 MED ORDER — MIDAZOLAM HCL 2 MG/2ML IJ SOLN: 2.0000 mg | INTRAMUSCULAR | Status: DC | PRN

## 2019-04-01 NOTE — Progress Notes (Addendum)
Subjective: Agitated off all sedation. Her Versed was tapered off fully by yesterday evening. Propofol taper continued overnight, with infusion stopped at 0800. She is now agitated, writhing in bed and biting ET tube, but opens eyes and follows simple commands.   Objective: Current vital signs: BP 95/80   Pulse (!) 107   Temp 99.9 F (37.7 C) (Oral)   Resp (!) 33   Ht 5' 10"  (1.778 m) Comment: measured pt x3  Wt 84.7 kg   SpO2 97%   Breastfeeding No   BMI 26.79 kg/m  Vital signs in last 24 hours: Temp:  [98.4 F (36.9 C)-100.5 F (38.1 C)] 99.9 F (37.7 C) (02/07 0400) Pulse Rate:  [83-107] 107 (02/07 0700) Resp:  [7-36] 33 (02/07 0700) BP: (95-145)/(47-80) 95/80 (02/07 0700) SpO2:  [97 %-100 %] 97 % (02/07 0751) Arterial Line BP: (87-142)/(63-94) 131/81 (02/07 0700) FiO2 (%):  [30 %-40 %] 30 % (02/07 0751)  Intake/Output from previous day: 02/06 0701 - 02/07 0700 In: 4694.9 [I.V.:3173.8; NG/GT:1125; IV Piggyback:396.2] Out: 3925 [Urine:3925] Intake/Output this shift: No intake/output data recorded. Nutritional status:  Diet Order            Diet NPO time specified  Diet effective now             Examination:  General: HEENT: [place]/AT Lungs: Intubated. Biting ET tube. Skin: Healed cut marks on lateral right thigh.   Ext: Noncyanotic  Neuro: GN:OIBBCWUG, writhing in bed. Opens eyes to voice and follows basic commands. Cannot answer complex questions with yes/no answers when asked to do so by lifting right/left arm.  QB:VQXIHWTUU pupils 5 mm and reactive. Opens eyes to verbal. Eyes conjugate with no forced deviation or nystagmus. Face symmetric.  Motor/Sensory:Moves all 4 extremities equally while agitated.    Lab Results: Results for orders placed or performed during the hospital encounter of 03/28/19 (from the past 48 hour(s))  CK     Status: Abnormal   Collection Time: 03/30/19  8:52 AM  Result Value Ref Range   Total CK 413 (H) 38 - 234 U/L     Comment: Performed at [name], [place] [address], [place], [place] [address]  Hepatic function panel     Status: Abnormal   Collection Time: 03/30/19  8:52 AM  Result Value Ref Range   Total Protein 5.1 (L) 6.5 - 8.1 g/dL    Comment: POST-ULTRACENTRIFUGATION   Albumin 2.8 (L) 3.5 - 5.0 g/dL    Comment: POST-ULTRACENTRIFUGATION   AST 248 (H) 15 - 41 U/L    Comment: POST-ULTRACENTRIFUGATION   ALT 150 (H) 0 - 44 U/L    Comment: POST-ULTRACENTRIFUGATION   Alkaline Phosphatase 56 38 - 126 U/L   Total Bilirubin 1.2 0.3 - 1.2 mg/dL    Comment: POST-ULTRACENTRIFUGATION   Bilirubin, Direct 0.7 (H) 0.0 - 0.2 mg/dL    Comment: POST-ULTRACENTRIFUGATION   Indirect Bilirubin 0.5 0.3 - 0.9 mg/dL    Comment: Performed at [facility] [address] [place], [place] [address]  Comprehensive metabolic panel     Status: Abnormal   Collection Time: 03/30/19 10:00 AM  Result Value Ref Range   Sodium 143 135 - 145 mmol/L    Comment: POST-ULTRACENTRIFUGATION   Potassium 4.2 3.5 - 5.1 mmol/L    Comment: HEMOLYSIS AT THIS LEVEL MAY AFFECT RESULT   Chloride 112 (H) 98 - 111 mmol/L   CO2 24 22 - 32 mmol/L   Glucose, Bld 110 (H) 70 - 99 mg/dL  BUN 8 6 - 20 mg/dL   Creatinine, Ser 0.72 0.44 - 1.00 mg/dL   Calcium 7.3 (L) 8.9 - 10.3 mg/dL   Total Protein 4.7 (L) 6.5 - 8.1 g/dL   Albumin 2.7 (L) 3.5 - 5.0 g/dL   AST 197 (H) 15 - 41 U/L   ALT 136 (H) 0 - 44 U/L   Alkaline Phosphatase 56 38 - 126 U/L   Total Bilirubin 1.1 0.3 - 1.2 mg/dL   GFR calc non Af Amer >60 >60 mL/min   GFR calc Af Amer >60 >60 mL/min   Anion gap 7 5 - 15    Comment: Performed at [facility] [address] [place], [place] [address]  CBC     Status: Abnormal   Collection Time: 03/30/19 10:00 AM  Result Value Ref Range   WBC 10.5 4.0 - 10.5 K/uL   RBC 3.48 (L) 3.87 - 5.11 MIL/uL   Hemoglobin 11.9 (L) 12.0 - 15.0 g/dL   HCT 32.7 (L) 36.0 - 46.0 %   MCV 94.0 80.0 - 100.0 fL   MCH 34.2 (H) 26.0 - 34.0 pg    MCHC 36.4 (H) 30.0 - 36.0 g/dL   RDW 12.9 11.5 - 15.5 %   Platelets 269 150 - 400 K/uL   nRBC 0.0 0.0 - 0.2 %    Comment: Performed at [facility] [address], [place], [place] [address]  Magnesium     Status: Abnormal   Collection Time: 03/30/19 10:04 AM  Result Value Ref Range   Magnesium 1.6 (L) 1.7 - 2.4 mg/dL    Comment: HEMOLYSIS AT THIS LEVEL MAY AFFECT RESULT Performed at [facility] [address], [place],  [address]   Phosphorus     Status: Abnormal   Collection Time: 03/30/19 10:04 AM  Result Value Ref Range   Phosphorus 1.9 (L) 2.5 - 4.6 mg/dL    Comment: POST-ULTRACENTRIFUGATION Performed at [facility] [address], [place],  [address]   Lithium level     Status: Abnormal   Collection Time: 03/30/19  1:00 PM  Result Value Ref Range   Lithium Lvl <0.06 (L) 0.60 - 1.20 mmol/L    Comment: LIPEMIC SPECIMEN Performed at [facility] [address] [place], [place] [address]   Acetaminophen level     Status: Abnormal   Collection Time: 03/30/19  1:00 PM  Result Value Ref Range   Acetaminophen (Tylenol), Serum <10 (L) 10 - 30 ug/mL    Comment: (NOTE) Therapeutic concentrations vary significantly. A range of 10-30 ug/mL  may be an effective concentration for many patients. However, some  are best treated at concentrations outside of this range. Acetaminophen concentrations >150 ug/mL at 4 hours after ingestion  and >50 ug/mL at 12 hours after ingestion are often associated with  toxic reactions. Performed at [facility], [place] [address], [place], [place] [address]   Glucose, capillary     Status: None   Collection Time: 03/30/19  1:54 PM  Result Value Ref Range   Glucose-Capillary 92 70 - 99 mg/dL  Glucose, capillary     Status: None   Collection Time: 03/30/19  4:30 PM  Result Value Ref Range   Glucose-Capillary 78 70 - 99 mg/dL  Glucose, capillary     Status: Abnormal   Collection Time: 03/30/19   7:19 PM  Result Value Ref Range   Glucose-Capillary 68 (L) 70 - 99 mg/dL  Glucose, capillary     Status: None   Collection Time: 03/30/19  7:43 PM  Result Value Ref Range   Glucose-Capillary 84 70 - 99 mg/dL  Magnesium     Status: None   Collection Time: 03/30/19  9:20 PM  Result Value Ref Range   Magnesium 2.1 1.7 - 2.4 mg/dL    Comment: Performed at [facility] [address] [place], [place] [address]  Phosphorus     Status: None   Collection Time: 03/30/19  9:20 PM  Result Value Ref Range   Phosphorus 2.9 2.5 - 4.6 mg/dL    Comment: Performed at [facility] [address], [place], [place] [address]  Glucose, capillary     Status: None   Collection Time: 03/30/19 11:10 PM  Result Value Ref Range   Glucose-Capillary 80 70 - 99 mg/dL  Glucose, capillary     Status: None   Collection Time: 03/31/19  3:28 AM  Result Value Ref Range   Glucose-Capillary 81 70 - 99 mg/dL  Basic metabolic panel     Status: Abnormal   Collection Time: 03/31/19  4:44 AM  Result Value Ref Range   Sodium 141 135 - 145 mmol/L   Potassium 3.8 3.5 - 5.1 mmol/L   Chloride 110 98 - 111 mmol/L   CO2 23 22 - 32 mmol/L   Glucose, Bld 105 (H) 70 - 99 mg/dL   BUN 10 6 - 20 mg/dL   Creatinine, Ser 0.52 0.44 - 1.00 mg/dL   Calcium 8.2 (L) 8.9 - 10.3 mg/dL   GFR calc non Af Amer >60 >60 mL/min   GFR calc Af Amer >60 >60 mL/min   Anion gap 8 5 - 15    Comment: Performed at [facility], [place] [address] [place], [place] [address]  CBC     Status: Abnormal   Collection Time: 03/31/19  4:44 AM  Result Value Ref Range   WBC 11.9 (H) 4.0 - 10.5 K/uL   RBC 3.40 (L) 3.87 - 5.11 MIL/uL   Hemoglobin 10.2 (L) 12.0 - 15.0 g/dL   HCT 31.6 (L) 36.0 - 46.0 %   MCV 92.9 80.0 - 100.0 fL   MCH 30.0 26.0 - 34.0 pg   MCHC 32.3 30.0 - 36.0 g/dL   RDW 13.1 11.5 - 15.5 %   Platelets 169 150 - 400 K/uL   nRBC 0.0 0.0 - 0.2 %    Comment: Performed at [facility], [place] [address], [place],  [place] [address]  Magnesium     Status: None   Collection Time: 03/31/19  4:44 AM  Result Value Ref Range   Magnesium 1.9 1.7 - 2.4 mg/dL    Comment: Performed at [facility] [address], [place], [place] [address]  Phosphorus     Status: Abnormal   Collection Time: 03/31/19  4:44 AM  Result Value Ref Range   Phosphorus 2.3 (L) 2.5 - 4.6 mg/dL    Comment: Performed at [facility] [address], [place], [place] [address]  Glucose, capillary     Status: None   Collection Time: 03/31/19  7:42 AM  Result Value Ref Range   Glucose-Capillary 85 70 - 99 mg/dL   Comment 1 Notify RN    Comment 2 Document in Chart   Lactic acid, plasma     Status: None   Collection Time: 03/31/19 10:27 AM  Result Value Ref Range  Lactic Acid, Venous 1.4 0.5 - 1.9 mmol/L    Comment: Performed at [facility], [place] [address] [place], [place] [address]  Glucose, capillary     Status: Abnormal   Collection Time: 03/31/19 11:52 AM  Result Value Ref Range   Glucose-Capillary 105 (H) 70 - 99 mg/dL   Comment 1 Notify RN    Comment 2 Document in Chart   Glucose, capillary     Status: Abnormal   Collection Time: 03/31/19  3:56 PM  Result Value Ref Range   Glucose-Capillary 106 (H) 70 - 99 mg/dL   Comment 1 Notify RN    Comment 2 Document in Chart   Magnesium     Status: None   Collection Time: 03/31/19  6:30 PM  Result Value Ref Range   Magnesium 1.9 1.7 - 2.4 mg/dL    Comment: Performed at [facility], [place] [address], [place], [place] [address]  Phosphorus     Status: Abnormal   Collection Time: 03/31/19  6:30 PM  Result Value Ref Range   Phosphorus 1.9 (L) 2.5 - 4.6 mg/dL    Comment: Performed at [facility] [address] [place], [place] [address]  Glucose, capillary     Status: None   Collection Time: 03/31/19  7:33 PM  Result Value Ref Range   Glucose-Capillary 99 70 - 99 mg/dL  Glucose, capillary     Status: Abnormal   Collection Time: 03/31/19 11:15 PM   Result Value Ref Range   Glucose-Capillary 115 (H) 70 - 99 mg/dL  Glucose, capillary     Status: Abnormal   Collection Time: 04/01/19  3:16 AM  Result Value Ref Range   Glucose-Capillary 109 (H) 70 - 99 mg/dL  Glucose, capillary     Status: Abnormal   Collection Time: 04/01/19  7:46 AM  Result Value Ref Range   Glucose-Capillary 106 (H) 70 - 99 mg/dL   Comment 1 Notify RN    Comment 2 Document in Chart     Recent Results (from the past 240 hour(s))  Respiratory Panel by RT PCR (Flu A&B, Covid) - Nasopharyngeal Swab     Status: None   Collection Time: 03/28/19 10:42 PM   Specimen: Nasopharyngeal Swab  Result Value Ref Range Status   SARS Coronavirus 2 by RT PCR NEGATIVE NEGATIVE Final    Comment: (NOTE) SARS-CoV-2 target nucleic acids are NOT DETECTED. The SARS-CoV-2 RNA is generally detectable in upper respiratoy specimens during the acute phase of infection. The lowest concentration of SARS-CoV-2 viral copies this assay can detect is 131 copies/mL. A negative result does not preclude SARS-Cov-2 infection and should not be used as the sole basis for treatment or other patient management decisions. A negative result may occur with  improper specimen collection/handling, submission of specimen other than nasopharyngeal swab, presence of viral mutation(s) within the areas targeted by this assay, and inadequate number of viral copies (<131 copies/mL). A negative result must be combined with clinical observations, patient history, and epidemiological information. The expected result is Negative. Fact Sheet for Patients:  PinkCheek.be Fact Sheet for Healthcare Providers:  GravelBags.it This test is not yet ap proved or cleared by the [place] FDA and  has been authorized for detection and/or diagnosis of SARS-CoV-2 by FDA under an Emergency Use Authorization (EUA). This EUA will remain  in effect (meaning this test can  be used) for the duration of the COVID-19 declaration under Section 564(b)(1) of the Act, 21 U.S.C.  section 360bbb-3(b)(1), unless the authorization is terminated or revoked sooner.    Influenza A by PCR NEGATIVE NEGATIVE Final   Influenza B by PCR NEGATIVE NEGATIVE Final    Comment: (NOTE) The Xpert Xpress SARS-CoV-2/FLU/RSV assay is intended as an aid in  the diagnosis of influenza from Nasopharyngeal swab specimens and  should not be used as a sole basis for treatment. Nasal washings and  aspirates are unacceptable for Xpert Xpress SARS-CoV-2/FLU/RSV  testing. Fact Sheet for Patients: PinkCheek.be Fact Sheet for Healthcare Providers: GravelBags.it This test is not yet approved or cleared by the [place] FDA and  has been authorized for detection and/or diagnosis of SARS-CoV-2 by  FDA under an Emergency Use Authorization (EUA). This EUA will remain  in effect (meaning this test can be used) for the duration of the  Covid-19 declaration under Section 564(b)(1) of the Act, 21  U.S.C. section 360bbb-3(b)(1), unless the authorization is  terminated or revoked. Performed at [facility], [place] [address], [place], [place] [address]   MRSA PCR Screening     Status: None   Collection Time: 03/29/19 10:36 AM   Specimen: Nasal Mucosa; Nasopharyngeal  Result Value Ref Range Status   MRSA by PCR NEGATIVE NEGATIVE Final    Comment:        The GeneXpert MRSA Assay (FDA approved for NASAL specimens only), is one component of a comprehensive MRSA colonization surveillance program. It is not intended to diagnose MRSA infection nor to guide or monitor treatment for MRSA infections. Performed at [facility], [place] [address] [place], [place] [address]   Culture, blood (single)     Status: None (Preliminary result)   Collection Time: 03/29/19 12:06 PM   Specimen: BLOOD  Result Value Ref  Range Status   Specimen Description   Final    BLOOD RIGHT ARM Performed at [facility] [address] [place], [place] [address]    Special Requests   Final    BOTTLES DRAWN AEROBIC AND ANAEROBIC Blood Culture adequate volume Performed at [facility] [address] [place], [place] [address]    Culture   Final    NO GROWTH 3 DAYS Performed at [facility], [place] [address] [place], [place] [address]    Report Status PENDING  Incomplete    Lipid Panel Recent Labs    03/29/19 2156  TRIG 144    Studies/Results: DG Chest Port 1 View  Result Date: 03/31/2019 CLINICAL DATA:  Ventilator dependent respiratory failure. Follow-up RIGHT basilar atelectasis and/or pneumonia. EXAM: PORTABLE CHEST 1 VIEW COMPARISON:  03/29/2019 and earlier. FINDINGS: Endotracheal tube tip in satisfactory position approximately 5 cm above the carina. RIGHT jugular central venous catheter tip projects over the LOWER SVC, unchanged. Nasogastric tube courses below the diaphragm into the stomach. Cardiac silhouette normal in size, unchanged. Worsening atelectasis involving the RIGHT MIDDLE LOBE and RIGHT LOWER LOBE since the examination 2 days ago. RIGHT UPPER LOBE and LEFT lung remain clear. IMPRESSION: 1. Support apparatus satisfactory. 2. Worsening atelectasis involving the RIGHT MIDDLE LOBE and RIGHT LOWER LOBE since the examination 2 days ago, query mucous plugging in the bronchus intermedius. 3. No new abnormalities. Electronically Signed   By: Evangeline Dakin M.D.   On: 03/31/2019 09:49   Overnight EEG with video  Result Date: 03/30/2019 Lora Havens, MD     03/31/2019  9:09 AM Patient Name: Carmen Daniels MRN: [id number] Epilepsy Attending: Lora Havens Referring Physician/Provider:  Dr. Kathrynn Speed Duration: 03/29/2019 2315  To 03/30/2019 2315 Patient history: 34 yo F who presented with ams. Likely suicidal overdose with dilated pupils and intermittent  myoclonus-like activity.  EEG to evaluate for seizures Level of alertness: comatose/sedated AEDs during EEG study: propofol, versed, keppra, dilantin Technical aspects: This EEG study was done with scalp electrodes positioned according to the 10-20 International system of electrode placement. Electrical activity was acquired at a sampling rate of 500Hz  and reviewed with a high frequency filter of 70Hz  and a low frequency filter of 1Hz . EEG data were recorded continuously and digitally stored. Description: EEG initially showed continuous generalized low amplitude 23 Hz delta slowing with intermittent (0.25 Hz) right anterior temporal sharp waves.  Gradually after midnight eeg showed continuous generalized 2 to 3 Hz delta activity with overriding 15 to 18 Hz, 2-3 uV beta activity with irregular morphology distributed symmetrically and diffusely.   Hyperventilation and photic stimulation were not performed. Abnormality -Sharp wave,  Right anterior temporal region -Continuous slow, generalized -Excessive beta, generalized IMPRESSION: This study initially showed evidence of potential epileptogenicity from the right anterior temporal region.  After around midnight, EEG showed profound diffuse encephalopathy, nonspecific to etiology but most likely secondary to sedation. The excessive beta activity seen in the background is most likely due to the effect of benzodiazepine and is a benign EEG pattern. No seizures were seen throughout the recording. EEG appears significantly improved compared to routine EEG done prior to the study. Lora Havens   ECHOCARDIOGRAM COMPLETE  Result Date: 03/30/2019   ECHOCARDIOGRAM REPORT   Patient Name:   Carmen Daniels Date of Exam: 03/30/2019 Medical Rec #:  [id number]      Height:       70.0 in Accession #:    [id number]     Weight:       187.2 lb Date of Birth:  26-Oct-1985       BSA:          2.03 m Patient Age:    33 years       BP:           113/51 mmHg Patient Gender: F               HR:           74 bpm. Exam Location:  Inpatient Procedure: 2D Echo Indications:    shock  History:        Patient has prior history of Echocardiogram examinations, most                 recent 07/03/2018. Risk Factors:overdose.  Sonographer:    Johny Chess Referring Phys: [id number] MANUEL E IZQUIERDO  Sonographer Comments: Echo performed with patient supine and on artificial respirator. Image acquisition challenging due to respiratory motion. IMPRESSIONS  1. Left ventricular ejection fraction, by visual estimation, is 55 to 60%. The left ventricle has normal function. There is no left ventricular hypertrophy. Normal diastolic function  2. Global right ventricle has normal systolic function.The right ventricular size is normal.  3. Left atrial size was normal.  4. Right atrial size was normal.  5. The mitral valve is normal in structure. No evidence of mitral valve regurgitation.  6. The tricuspid valve is normal in structure. Tricuspid valve regurgitation is trivial.  7. The aortic valve is tricuspid. Aortic valve regurgitation is not visualized. No evidence of aortic valve sclerosis or stenosis.  8. The pulmonic valve was not well visualized. Pulmonic valve regurgitation is not  visualized.  9. Normal pulmonary artery systolic pressure. FINDINGS  Left Ventricle: Left ventricular ejection fraction, by visual estimation, is 55 to 60%. The left ventricle has normal function. The left ventricle has no regional wall motion abnormalities. The left ventricular internal cavity size was the left ventricle is normal in size. There is no left ventricular hypertrophy. Left ventricular diastolic parameters were normal. Right Ventricle: The right ventricular size is normal. No increase in right ventricular wall thickness. Global RV systolic function is has normal systolic function. The tricuspid regurgitant velocity is 2.02 m/s, and with an assumed right atrial pressure  of 3 mmHg, the estimated right ventricular systolic  pressure is normal at 19.3 mmHg. Left Atrium: Left atrial size was normal in size. Right Atrium: Right atrial size was normal in size Pericardium: There is no evidence of pericardial effusion. Mitral Valve: The mitral valve is normal in structure. No evidence of mitral valve regurgitation. Tricuspid Valve: The tricuspid valve is normal in structure. Tricuspid valve regurgitation is trivial. Aortic Valve: The aortic valve is tricuspid. Aortic valve regurgitation is not visualized. The aortic valve is structurally normal, with no evidence of sclerosis or stenosis. Pulmonic Valve: The pulmonic valve was not well visualized. Pulmonic valve regurgitation is not visualized. Pulmonic regurgitation is not visualized. Aorta: The aortic root is normal in size and structure. Venous: IVC assessment for right atrial pressure unable to be performed due to mechanical ventilation. IAS/Shunts: No atrial level shunt detected by color flow Doppler.  LEFT VENTRICLE PLAX 2D LVIDd:         4.50 cm  Diastology LVIDs:         3.30 cm  LV e' lateral:   20.70 cm/s LV PW:         0.90 cm  LV E/e' lateral: 5.7 LV IVS:        0.80 cm  LV e' medial:    13.90 cm/s LVOT diam:     2.10 cm  LV E/e' medial:  8.6 LV SV:         48 ml LV SV Index:   23.43 LVOT Area:     3.46 cm  RIGHT VENTRICLE RV S prime:     15.70 cm/s TAPSE (M-mode): 1.6 cm LEFT ATRIUM             Index       RIGHT ATRIUM           Index LA diam:        2.50 cm 1.23 cm/m  RA Area:     11.60 cm LA Vol (A2C):   42.0 ml 20.70 ml/m RA Volume:   24.70 ml  12.17 ml/m LA Vol (A4C):   30.9 ml 15.23 ml/m LA Biplane Vol: 37.9 ml 18.68 ml/m  AORTIC VALVE LVOT Vmax:   105.00 cm/s LVOT Vmean:  69.300 cm/s LVOT VTI:    0.207 m  AORTA Ao Root diam: 3.00 cm MITRAL VALVE                         TRICUSPID VALVE MV Area (PHT): 3.91 cm              TR Peak grad:   16.3 mmHg MV PHT:        56.26 msec            TR Vmax:        202.00 cm/s MV Decel Time: 194 msec MV E velocity: 119.00 cm/s 103  cm/s  SHUNTS MV A velocity: 61.50 cm/s  70.3 cm/s Systemic VTI:  0.21 m MV E/A ratio:  1.93        1.5       Systemic Diam: 2.10 cm  Oswaldo Milian MD Electronically signed by Oswaldo Milian MD Signature Date/Time: 03/30/2019/12:59:56 PM    Final     Medications:  Scheduled: . chlorhexidine gluconate (MEDLINE KIT)  15 mL Mouth Rinse BID  . Chlorhexidine Gluconate Cloth  6 each Topical Daily  . enoxaparin (LOVENOX) injection  40 mg Subcutaneous Daily  . feeding supplement (PRO-STAT SUGAR FREE 64)  30 mL Per Tube BID  . mouth rinse  15 mL Mouth Rinse 10 times per day  . pantoprazole sodium  40 mg Per Tube Q24H  . sodium chloride flush  10-40 mL Intracatheter Q12H   Continuous: . sodium chloride    . clindamycin (CLEOCIN) IV 100 mL/hr at 04/01/19 0700  . dexmedetomidine (PRECEDEX) IV infusion    . dextrose 5% lactated ringers 100 mL/hr at 04/01/19 0700  . feeding supplement (VITAL AF 1.2 CAL) 45 mL/hr at 04/01/19 0700  . levETIRAcetam Stopped (03/31/19 2146)  . midazolam Stopped (03/31/19 1850)  . norepinephrine (LEVOPHED) Adult infusion Stopped (03/31/19 1813)  . propofol (DIPRIVAN) infusion 5 mcg/kg/min (04/01/19 0700)  . sodium bicarbonate 150 mEq in dextrose 5% 1000 mL Stopped (03/29/19 2155)    LTM EEG report for this morning:  This study showed severe diffuse encephalopathy, nonspecific to etiology but most likely secondary to sedation. The excessive beta activity seen in the background is most likely due to the effect of benzodiazepine and is a benign EEG pattern. No seizures were seen throughout the recording.  Assessment:34 year old female who presented after suicidal overdose on Wellbutrin. Was noted to be in status epilepticus overnight and started on Versed and propofol. Status epilepticus (resolved) with provoked seizures Suicidal overdose Acute toxic metabolic encephalopathy Acute respiratory failure Bipolar disorder Right hand  cellulitis Hypertension Prolonged QT interval Anemia Hypoalbuminemia Transaminitis Hyperbilirubinemia Elevated CK -Status epilepticus in the setting of Wellbutrin overdose, no further seizures since starting Versed and propofol. -LTM EEG for this AM: Consistent with severe diffuse encephalopathy. No seizures seen.   Recommendations - On sedation for > 24 hours as of Saturday AM, with no electrographic seizures seen on LTM EEG (midazolam had been running since 2147 on 2/4). Versed and propofol weaning were begun yesterday (Saturday) by 5 mg/hr and 5 mcg/kg/hr, respectively, starting in the AM. Versed was tapered off fully by yesterday evening. Propofol taper was continued overnight, with infusion stopped at 0800.  -- She is now agitated off IV sedation. Versed 4 mg IV q1h PRN agitation has been ordered. RN is calling ICU team for possible Precedex gtt to manage agitation while intubated. This medication is the best choice for agitation from a Neurological standpoint as it does not have an anticonvulsant effect. Best option is to rely currently only on her Keppra for seizure prevention. - Continue to monitor with LTM and move towards vent weaning with extubation.      -Continue Keppra 1500 mg twice daily. If patient has any further seizures, consider adding valproic acid. -We will obtain MRI brain without contrast once EEGisdisconnected,likely on Monday -Anticipate patient will not require long-term antiepileptics as these are likely provoked seizures in the setting of overdose. -Continue seizure precautions -Management of other comorbidities per primary team  40 minutes spent in the neurological evaluation and management of this critically ill patient.    LOS:  3 days   @Electronically  signed: Dr. Kerney Elbe 04/01/2019  8:04 AM

## 2019-04-01 NOTE — Progress Notes (Signed)
Patient extubated per MD's order placed on 3LNC, no stridor heard, SATS 95%, patient is able to vocalize, will continue to monitor patient.

## 2019-04-01 NOTE — Progress Notes (Signed)
[facility] Progress Note Patient Name: Carmen Daniels DOB: Sep 27, 1985 MRN: [id number]   Date of Service  04/01/2019  HPI/Events of Note  ABG: 7.45/31/45/21.7  Patient was changed from 6L [place] to [place] HFNC and is satting 90% though frequently coughing.    eICU Interventions  NPO. Ordered STAT CXR. [place] HFNC. Will ask bedside team to assess her as well.     Intervention Category Major Interventions: Respiratory failure - evaluation and management  Marily Lente Shemar Plemmons 04/01/2019, 11:41 PM

## 2019-04-01 NOTE — Progress Notes (Signed)
Placed patient on 5/5 30% to start weaning. MD aware.

## 2019-04-01 NOTE — Progress Notes (Signed)
LTM maint complete - no skin breakdown under:  F8, M2, C4

## 2019-04-01 NOTE — Procedures (Addendum)
Patient Name:Carmen Daniels [id number] Epilepsy Attending:[place]an[place] Fitting [name] Referring Physician/Provider:Dr. Kathrynn Speed Duration:03/31/2019 2315to2/08/2019 2315  Patient history:34 yo F who presented with ams. Likelysuicidal overdose with dilated pupils and intermittent myoclonus-like activity.EEG to evaluate for seizures  Level of alertness:awake, asleep  AEDs during EEG study: keppra  Technical aspects: This EEG study was done with scalp electrodes positioned according to the 10-20 International system of electrode placement. Electrical activity was acquired at a sampling rate of 500Hz  and reviewed with a high frequency filter of 70Hz  and a low frequency filter of 1Hz . EEG data were recorded continuously and digitally stored.  Description: During awake state, no clear posterior dominant rhythm was seen. Sleep was characterized by vertex waves, sleep spindles (12-14hz ), maximal frontocentral, REM sleep. EEG showed continuous generalized 2 to 3 Hz delta activity with overriding15 to 18 Hz, 2-3 uV beta activity with irregular morphology distributed symmetrically and diffusely. Hyperventilation and photic stimulation were not performed.  Abnormality - Continuous slow, generalized - Excessive beta, generalized  IMPRESSION: This study showed mild to moderate diffuse encephalopathy, nonspecific to etiology but most likely secondary to sedation. The excessive beta activity seen in the background is most likely due to the effect of benzodiazepine and is a benign EEG pattern. No seizures were seen throughout the recording.  Riki Gehring [name]

## 2019-04-01 NOTE — Progress Notes (Signed)
eLink Physician-Brief Progress Note Patient Name: Carmen Daniels DOB: [birth year] MRN: [id number]   Date of Service  04/01/2019  HPI/Events of Note  Patient was recently extubated. Checked in on her to ensure respiratory status remains stable.  She is in no distress, however she is still quite hypoxemic (satting 91% on 6L [place]).  CXR shows lobar collapse at right lung base. Suspect endobronchial plugging. Not currently on Carmen respiratory clearance therapy.   eICU Interventions  Ordered Q4H flutter valve while awake.     Intervention Category Major Interventions: Respiratory failure - evaluation and management  Marily Lente Elisha Cooksey 04/01/2019, 10:12 PM

## 2019-04-01 NOTE — Procedures (Signed)
Extubation Procedure Note  Patient Details:   Name: LABREESKA FINBERG DOB: 31-May-1985 MRN: [id number]   Airway Documentation:  Airway 7.5 mm (Active)  Secured at (cm) 24 cm 04/01/19 0751  Measured From Lips 04/01/19 [place] 04/01/19 0751  Secured By [employer] 04/01/19 0751  Tube Holder Repositioned Yes 04/01/19 0751  Cuff Pressure (cm H2O) 28 cm H2O 04/01/19 0751  Site Condition Dry 04/01/19 0751   Vent end date: (not recorded) Vent end time: (not recorded)   Evaluation  O2 sats: stable throughout Complications: No apparent complications Patient did tolerate procedure well. Bilateral Breath Sounds: Rhonchi   Yes  Janett Labella 04/01/2019, 10:30 AM

## 2019-04-01 NOTE — Progress Notes (Signed)
NAME:  Carmen Daniels, MRN:  [id number], DOB:  September 23, 1985, LOS: 3 ADMISSION DATE:  03/28/2019, CONSULTATION DATE: 03/29/2019 REFERRING MD:  Dr. Leonette Monarch, ED, CHIEF COMPLAINT:  AMS   Brief History   34 yo female smoker with hx of Bipolar and OCD found with altered mental status from presumed intentional overdose.  Empty bottles of lithium, ambien, flexeril and wellbutrin found. Developed status epilepticus.  Required intubation for airway protection and treatment of status.   Past Medical History  Bipolar, depression, OCD, Cocaine abuse  Significant Hospital Events   2/04 Admit 2/05 start burst suppression 2/6; stable, awake and alert today.   Consults:  Neurology  Procedures:  ETT 2/04 >> Rt IJ CVL 2/04 >>   Significant Diagnostic Tests:  CT head 2/05 >> no acute findings Echo 2/05 >>   Micro Data:  SARS CoV2 PCR 2/03 >> negative Influenza PCR 2/03 >> negative Blood 2/04 >>  Antimicrobials:  Clindamycin 2/04 >>  Diflucan 2/04 >> 2/4  Interim history/subjective:    Objective   Blood pressure 95/80, pulse (!) 107, temperature 99.9 F (37.7 C), temperature source Oral, resp. rate (!) 33, height 5\' 10"  (1.778 m), weight 84.7 kg, SpO2 98 %, not currently breastfeeding.    Vent Mode: PRVC FiO2 (%):  [40 %] 40 % Set Rate:  [16 bmp] 16 bmp Vt Set:  [540 mL] 540 mL PEEP:  [5 cmH20] 5 cmH20 Plateau Pressure:  [10 cmH20-15 cmH20] 13 cmH20   Intake/Output Summary (Last 24 hours) at 04/01/2019 0736 Last data filed at 04/01/2019 0700 Gross per 24 hour  Intake 4694.93 ml  Output 3925 ml  Net 769.93 ml   Filed Weights   03/29/19 1300 03/30/19 0500 03/31/19 0500  Weight: 83.3 kg 84.9 kg 84.7 kg    Examination:  General - awake following commands lifting head.  Eyes - pupils dilated, reactive and symmetric ENT - ETT in place Cardiac - regular rate/rhythm, no murmur Chest - equal breath sounds b/l, no wheezing or rales Abdomen - soft, non tender, + bowel sounds Extremities -  no cyanosis, clubbing, or edema Skin - no rashes Neuro - RASS -4  Resolved Hospital Problem list     Assessment & Plan:   Acute metabolic encephalopathy in setting of intentional overdose. Status epilepticus. Intubated for airway protection On Keppra BID Off versed and propofol.  Awake  Will extubate today.   Hypotension from sedation. Resolved  Bipolar, depression, OCD. - will need psych assessment when she improves medically  Rt hand cellulitis. - day 4  of clindamycin Cellulitis looks resolved, will do short course Abtx.   Best practice:  Diet: swallow eval when appropriate DVT prophylaxis: lovenox daily GI prophylaxis: protonix daily Mobility: bed rest Code Status: full code Disposition: ICU  Labs    CMP Latest Ref Rng & Units 03/31/2019 03/30/2019 03/30/2019  Glucose 70 - 99 mg/dL 105(H) 110(H) -  BUN 6 - 20 mg/dL 10 8 -  Creatinine 0.44 - 1.00 mg/dL 0.52 0.72 -  Sodium 135 - 145 mmol/L 141 143 -  Potassium 3.5 - 5.1 mmol/L 3.8 4.2 -  Chloride 98 - 111 mmol/L 110 112(H) -  CO2 22 - 32 mmol/L 23 24 -  Calcium 8.9 - 10.3 mg/dL 8.2(L) 7.3(L) -  Total Protein 6.5 - 8.1 g/dL - 4.7(L) 5.1(L)  Total Bilirubin 0.3 - 1.2 mg/dL - 1.1 1.2  Alkaline Phos 38 - 126 U/L - 56 56  AST 15 - 41 U/L - 197(H) 248(H)  ALT 0 - 44 U/L - 136(H) 150(H)    CBC Latest Ref Rng & Units 03/31/2019 03/30/2019 03/30/2019  WBC 4.0 - 10.5 K/uL 11.9(H) 10.5 -  Hemoglobin 12.0 - 15.0 g/dL 10.2(L) 11.9(L) 10.9(L)  Hematocrit 36.0 - 46.0 % 31.6(L) 32.7(L) 32.0(L)  Platelets 150 - 400 K/uL 169 269 -    ABG    Component Value Date/Time   PHART 7.416 03/30/2019 0625   PCO2ART 39.7 03/30/2019 0625   PO2ART 188.0 (H) 03/30/2019 0625   HCO3 25.5 03/30/2019 0625   TCO2 27 03/30/2019 0625   O2SAT 100.0 03/30/2019 0625    CBG (last 3)  Recent Labs    03/31/19 1933 03/31/19 2315 04/01/19 0316  GLUCAP 99 115* 109*   Collier Bullock   CC time 45 min

## 2019-04-02 ENCOUNTER — Inpatient Hospital Stay (HOSPITAL_COMMUNITY): Payer: Medicaid Other

## 2019-04-02 DIAGNOSIS — T43292A Poisoning by other antidepressants, intentional self-harm, initial encounter: Secondary | ICD-10-CM

## 2019-04-02 DIAGNOSIS — J9601 Acute respiratory failure with hypoxia: Secondary | ICD-10-CM

## 2019-04-02 DIAGNOSIS — T43291A Poisoning by other antidepressants, accidental (unintentional), initial encounter: Secondary | ICD-10-CM

## 2019-04-02 LAB — RENAL FUNCTION PANEL
Albumin: 2.5 g/dL — ABNORMAL LOW (ref 3.5–5.0)
Anion gap: 13 (ref 5–15)
BUN: 8 mg/dL (ref 6–20)
CO2: 29 mmol/L (ref 22–32)
Calcium: 8.4 mg/dL — ABNORMAL LOW (ref 8.9–10.3)
Chloride: 100 mmol/L (ref 98–111)
Creatinine, Ser: 0.74 mg/dL (ref 0.44–1.00)
GFR calc Af Amer: >60 mL/min (ref >60)
GFR calc non Af Amer: >60 mL/min (ref >60)
Glucose, Bld: 154 mg/dL — ABNORMAL HIGH (ref 70–99)
Phosphorus: 4.1 mg/dL (ref 2.5–4.6)
Potassium: 3.4 mmol/L — ABNORMAL LOW (ref 3.5–5.1)
Sodium: 142 mmol/L (ref 135–145)

## 2019-04-02 LAB — POCT I-STAT 7, (LYTES, BLD GAS, ICA,H+H)
Acid-Base Excess: 4 mmol/L — ABNORMAL HIGH (ref 0.0–2.0)
Acid-Base Excess: 7 mmol/L — ABNORMAL HIGH (ref 0.0–2.0)
Bicarbonate: 27.7 mmol/L (ref 20.0–28.0)
Bicarbonate: 32.2 mmol/L — ABNORMAL HIGH (ref 20.0–28.0)
Calcium, Ion: 1.13 mmol/L — ABNORMAL LOW (ref 1.15–1.40)
Calcium, Ion: 1.13 mmol/L — ABNORMAL LOW (ref 1.15–1.40)
HCT: 32 % — ABNORMAL LOW (ref 36.0–46.0)
HCT: 32 % — ABNORMAL LOW (ref 36.0–46.0)
Hemoglobin: 10.9 g/dL — ABNORMAL LOW (ref 12.0–15.0)
Hemoglobin: 10.9 g/dL — ABNORMAL LOW (ref 12.0–15.0)
O2 Saturation: 97 %
O2 Saturation: 100 %
Patient temperature: 100.4
Potassium: 3.2 mmol/L — ABNORMAL LOW (ref 3.5–5.1)
Potassium: 3.6 mmol/L (ref 3.5–5.1)
Sodium: 145 mmol/L (ref 135–145)
Sodium: 142 mmol/L (ref 135–145)
TCO2: 29 mmol/L (ref 22–32)
TCO2: 34 mmol/L — ABNORMAL HIGH (ref 22–32)
pCO2 arterial: 40.1 mmHg (ref 32.0–48.0)
pCO2 arterial: 46.2 mmHg (ref 32.0–48.0)
pH, Arterial: 7.451 — ABNORMAL HIGH (ref 7.350–7.450)
pH, Arterial: 7.451 — ABNORMAL HIGH (ref 7.350–7.450)
pO2, Arterial: 87 mmHg (ref 83.0–108.0)
pO2, Arterial: 324 mmHg — ABNORMAL HIGH (ref 83.0–108.0)

## 2019-04-02 LAB — GLUCOSE, CAPILLARY
Glucose-Capillary: 120 mg/dL — ABNORMAL HIGH (ref 70–99)
Glucose-Capillary: 160 mg/dL — ABNORMAL HIGH (ref 70–99)
Glucose-Capillary: 136 mg/dL — ABNORMAL HIGH (ref 70–99)
Glucose-Capillary: 122 mg/dL — ABNORMAL HIGH (ref 70–99)
Glucose-Capillary: 97 mg/dL (ref 70–99)
Glucose-Capillary: 118 mg/dL — ABNORMAL HIGH (ref 70–99)

## 2019-04-02 LAB — PHOSPHORUS: Phosphorus: 2.8 mg/dL (ref 2.5–4.6)

## 2019-04-02 LAB — CBC
HCT: 34.2 % — ABNORMAL LOW (ref 36.0–46.0)
Hemoglobin: 11 g/dL — ABNORMAL LOW (ref 12.0–15.0)
MCH: 29.5 pg (ref 26.0–34.0)
MCHC: 32.2 g/dL (ref 30.0–36.0)
MCV: 91.7 fL (ref 80.0–100.0)
Platelets: 205 10*3/uL (ref 150–400)
RBC: 3.73 MIL/uL — ABNORMAL LOW (ref 3.87–5.11)
RDW: 13.2 % (ref 11.5–15.5)
WBC: 18.8 10*3/uL — ABNORMAL HIGH (ref 4.0–10.5)
nRBC: 0 % (ref 0.0–0.2)

## 2019-04-02 MED ORDER — MIDAZOLAM HCL 2 MG/2ML IJ SOLN: 2.0000 mg | Freq: Once | INTRAMUSCULAR | Status: AC

## 2019-04-02 MED ORDER — POTASSIUM CHLORIDE 10 MEQ/100ML IV SOLN
10.0000 meq | INTRAVENOUS | Status: AC
  Administered 2019-04-02 (×4): 10 meq via INTRAVENOUS
  Filled 2019-04-02 (×4): qty 100

## 2019-04-02 MED ORDER — MIDAZOLAM HCL 2 MG/2ML IJ SOLN
INTRAMUSCULAR | Status: AC
  Filled 2019-04-02: qty 2

## 2019-04-02 MED ORDER — PANTOPRAZOLE SODIUM 40 MG IV SOLR
40.0000 mg | INTRAVENOUS | Status: DC
  Administered 2019-04-02 – 2019-04-04 (×3): 40 mg via INTRAVENOUS
  Filled 2019-04-02 (×3): qty 40

## 2019-04-02 MED ORDER — METHYLPREDNISOLONE SODIUM SUCC 125 MG IJ SOLR
125.0000 mg | Freq: Once | INTRAMUSCULAR | Status: AC
  Administered 2019-04-02: 125 mg via INTRAVENOUS
  Filled 2019-04-02: qty 2

## 2019-04-02 MED ORDER — FENTANYL 2500MCG IN NS 250ML (10MCG/ML) PREMIX INFUSION
0.0000 ug/h | INTRAVENOUS | Status: DC
  Administered 2019-04-02: 200 ug/h via INTRAVENOUS
  Administered 2019-04-02: 350 ug/h via INTRAVENOUS
  Administered 2019-04-03: 200 ug/h via INTRAVENOUS
  Administered 2019-04-03: 350 ug/h via INTRAVENOUS
  Administered 2019-04-03: 11:00:00 100 ug/h via INTRAVENOUS
  Filled 2019-04-02 (×5): qty 250

## 2019-04-02 MED ORDER — ORAL CARE MOUTH RINSE
15.0000 mL | OROMUCOSAL | Status: DC
  Administered 2019-04-02 – 2019-04-04 (×18): 15 mL via OROMUCOSAL

## 2019-04-02 MED ORDER — FENTANYL CITRATE (PF) 100 MCG/2ML IJ SOLN
INTRAMUSCULAR | Status: AC
  Filled 2019-04-02: qty 2

## 2019-04-02 MED ORDER — ETOMIDATE 2 MG/ML IV SOLN: 10.0000 mg | Freq: Once | INTRAVENOUS | Status: AC

## 2019-04-02 MED ORDER — LEVOFLOXACIN IN D5W 750 MG/150ML IV SOLN
750.0000 mg | INTRAVENOUS | Status: AC
  Administered 2019-04-02 – 2019-04-06 (×5): 750 mg via INTRAVENOUS
  Filled 2019-04-02 (×5): qty 150

## 2019-04-02 MED ORDER — FENTANYL CITRATE (PF) 100 MCG/2ML IJ SOLN
50.0000 ug | INTRAMUSCULAR | Status: DC | PRN
  Administered 2019-04-03: 100 ug via INTRAVENOUS
  Administered 2019-04-03: 200 ug via INTRAVENOUS
  Administered 2019-04-03 (×3): 100 ug via INTRAVENOUS
  Administered 2019-04-03: 200 ug via INTRAVENOUS

## 2019-04-02 MED ORDER — LORAZEPAM 2 MG/ML IJ SOLN: 2.0000 mg | INTRAMUSCULAR | Status: DC | PRN

## 2019-04-02 MED ORDER — FENTANYL CITRATE (PF) 100 MCG/2ML IJ SOLN
100.0000 ug | Freq: Once | INTRAMUSCULAR | Status: AC
  Administered 2019-04-02: 100 ug via INTRAVENOUS

## 2019-04-02 MED ORDER — PROPOFOL 1000 MG/100ML IV EMUL
0.0000 ug/kg/min | INTRAVENOUS | Status: DC
  Administered 2019-04-02: 45 ug/kg/min via INTRAVENOUS
  Administered 2019-04-02: 50 ug/kg/min via INTRAVENOUS
  Administered 2019-04-02: 20 ug/kg/min via INTRAVENOUS
  Administered 2019-04-03: 50 ug/kg/min via INTRAVENOUS
  Administered 2019-04-03: 45 ug/kg/min via INTRAVENOUS
  Administered 2019-04-03 (×3): 50 ug/kg/min via INTRAVENOUS
  Filled 2019-04-02 (×8): qty 100

## 2019-04-02 MED ORDER — FENTANYL CITRATE (PF) 100 MCG/2ML IJ SOLN: 100.0000 ug | Freq: Once | INTRAMUSCULAR | Status: AC

## 2019-04-02 MED ORDER — FUROSEMIDE 10 MG/ML IJ SOLN
40.0000 mg | Freq: Once | INTRAMUSCULAR | Status: AC
  Administered 2019-04-02: 40 mg via INTRAVENOUS
  Filled 2019-04-02: qty 4

## 2019-04-02 MED ORDER — ONDANSETRON HCL 4 MG/2ML IJ SOLN
4.0000 mg | Freq: Four times a day (QID) | INTRAMUSCULAR | Status: DC | PRN
  Administered 2019-04-04 – 2019-04-05 (×2): 4 mg via INTRAVENOUS
  Filled 2019-04-02 (×2): qty 2

## 2019-04-02 MED ORDER — VITAL HIGH PROTEIN PO LIQD
1000.0000 mL | ORAL | Status: DC
  Administered 2019-04-02: 1000 mL
  Filled 2019-04-02: qty 1000

## 2019-04-02 MED ORDER — POTASSIUM CHLORIDE 20 MEQ/15ML (10%) PO SOLN
40.0000 meq | Freq: Once | ORAL | Status: AC
  Administered 2019-04-02: 40 meq
  Filled 2019-04-02: qty 30

## 2019-04-02 MED ORDER — MIDAZOLAM HCL 2 MG/2ML IJ SOLN
1.0000 mg | INTRAMUSCULAR | Status: DC | PRN
  Administered 2019-04-02 – 2019-04-04 (×8): 2 mg via INTRAVENOUS
  Filled 2019-04-02 (×7): qty 2

## 2019-04-02 MED ORDER — ETOMIDATE 2 MG/ML IV SOLN
20.0000 mg | Freq: Once | INTRAVENOUS | Status: AC
  Administered 2019-04-02: 20 mg via INTRAVENOUS

## 2019-04-02 MED ORDER — MIDAZOLAM HCL 2 MG/2ML IJ SOLN
2.0000 mg | Freq: Once | INTRAMUSCULAR | Status: AC
  Administered 2019-04-02: 2 mg via INTRAVENOUS

## 2019-04-02 MED ORDER — ROCURONIUM BROMIDE 50 MG/5ML IV SOLN
50.0000 mg | Freq: Once | INTRAVENOUS | Status: AC
  Administered 2019-04-02: 50 mg via INTRAVENOUS

## 2019-04-02 MED ORDER — METHYLPREDNISOLONE SODIUM SUCC 40 MG IJ SOLR
40.0000 mg | Freq: Three times a day (TID) | INTRAMUSCULAR | Status: DC
  Administered 2019-04-02 – 2019-04-03 (×3): 40 mg via INTRAVENOUS
  Filled 2019-04-02 (×3): qty 1

## 2019-04-02 MED ORDER — METHYLPREDNISOLONE SODIUM SUCC 125 MG IJ SOLR
60.0000 mg | Freq: Four times a day (QID) | INTRAMUSCULAR | Status: DC
  Administered 2019-04-02: 60 mg via INTRAVENOUS
  Filled 2019-04-02: qty 2

## 2019-04-02 MED ORDER — LEVETIRACETAM IN NACL 1000 MG/100ML IV SOLN
1000.0000 mg | Freq: Two times a day (BID) | INTRAVENOUS | Status: DC
  Administered 2019-04-02 – 2019-04-06 (×8): 1000 mg via INTRAVENOUS
  Filled 2019-04-02 (×9): qty 100

## 2019-04-02 MED ORDER — ORAL CARE MOUTH RINSE
15.0000 mL | Freq: Two times a day (BID) | OROMUCOSAL | Status: DC
  Administered 2019-04-02: 15 mL via OROMUCOSAL

## 2019-04-02 MED ORDER — METRONIDAZOLE IN NACL 5-0.79 MG/ML-% IV SOLN
500.0000 mg | Freq: Three times a day (TID) | INTRAVENOUS | Status: DC
  Administered 2019-04-02 – 2019-04-05 (×9): 500 mg via INTRAVENOUS
  Filled 2019-04-02 (×9): qty 100

## 2019-04-02 MED ORDER — FENTANYL CITRATE (PF) 100 MCG/2ML IJ SOLN
50.0000 ug | INTRAMUSCULAR | Status: DC | PRN
  Administered 2019-04-02: 50 ug via INTRAVENOUS
  Filled 2019-04-02: qty 2

## 2019-04-02 MED ORDER — CLONAZEPAM 0.5 MG PO TBDP
1.0000 mg | ORAL_TABLET | Freq: Three times a day (TID) | ORAL | Status: DC
  Administered 2019-04-02 – 2019-04-04 (×6): 1 mg
  Filled 2019-04-02 (×6): qty 2

## 2019-04-02 NOTE — Progress Notes (Signed)
RT NOTE: RT placed patient on [place] per NP. Patient tolerating 20L 80% at this time. RT will continue to monitor.

## 2019-04-02 NOTE — Progress Notes (Signed)
RT obtained ABG on pt with the following results. No changes at this time. PT was on NRB 100% when ABG was obtained. RT will continue to monitor.   Results for Carmen Daniels, Carmen Daniels (MRN [id number]) as of 04/02/2019 05:54  Ref. Range 04/02/2019 05:37  Sample type Unknown ARTERIAL  pH, Arterial Latest Ref Range: 7.350 - 7.450  7.451 (H)  pCO2 arterial Latest Ref Range: 32.0 - 48.0 mmHg 40.1  pO2, Arterial Latest Ref Range: 83.0 - 108.0 mmHg 87.0  TCO2 Latest Ref Range: 22 - 32 mmol/L 29  Acid-Base Excess Latest Ref Range: 0.0 - 2.0 mmol/L 4.0 (H)  Bicarbonate Latest Ref Range: 20.0 - 28.0 mmol/L 27.7  O2 Saturation Latest Units: % 97.0  Patient temperature Unknown 100.4 F  Collection site Unknown RADIAL, ALLEN'S TEST ACCEPTABLE

## 2019-04-02 NOTE — Progress Notes (Signed)
Subjective: Patient was extubated yesterday.  No clinical seizure-like episodes overnight.  This morning patient denies any headache or any other concerns.  ROS: negative except above  Examination  Vital signs in last 24 hours: Temp:  [98.1 F (36.7 C)-99.6 F (37.6 C)] 99.6 F (37.6 C) (02/08 0800) Pulse Rate:  [81-115] 88 (02/08 1000) Resp:  [29-54] 51 (02/08 1000) BP: (79-153)/(59-93) 136/77 (02/08 1000) SpO2:  [86 %-99 %] 94 % (02/08 1000) Arterial Line BP: (96-148)/(76-109) 110/95 (02/08 0500) FiO2 (%):  [80 %-100 %] 80 % (02/08 0900)  General: lying in bed, not in apparent distress CVS: pulse-normal rate and rhythm RS: breathing comfortably Extremities: normal, warm  Neuro: MS: Alert, oriented to self only, follows commands CN: pupils equal and reactive,  EOMI, face symmetric, tongue midline, normal sensation over face, Motor: 5/5 strength in all 4 extremities  Basic Metabolic Panel: Recent Labs  Lab 03/28/19 2125 03/28/19 2125 03/29/19 0257 03/29/19 1816 03/29/19 2151 03/30/19 1000 03/30/19 1004 03/30/19 2120 03/31/19 0444 03/31/19 1830 04/01/19 2317 04/02/19 0537 04/02/19 0900  NA 140   < >  --  142   < > 143  --   --  141  --  148* 145 142  K 4.1   < >  --  3.1*   < > 4.2  --   --  3.8  --  3.2* 3.2* 3.4*  CL 102  --   --  105  --  112*  --   --  110  --   --   --  100  CO2 24  --   --  29  --  24  --   --  23  --   --   --  29  GLUCOSE 111*  --   --  110*  --  110*  --   --  105*  --   --   --  154*  BUN 15  --   --  11  --  8  --   --  10  --   --   --  8  CREATININE 0.81   < > 0.76 0.65  --  0.72  --   --  0.52  --   --   --  0.74  CALCIUM 9.8   < >  --  8.2*   < > 7.3*  --   --  8.2*  --   --   --  8.4*  MG  --   --   --   --   --   --  1.6* 2.1 1.9 1.9  --   --   --   PHOS  --   --   --   --   --   --  1.9* 2.9 2.3* 1.9*  --   --  4.1   < > = values in this interval not displayed.    CBC: Recent Labs  Lab 03/28/19 2125 03/28/19 2125  03/29/19 1816 03/29/19 2151 03/30/19 1000 03/31/19 0444 04/01/19 2317 04/02/19 0537 04/02/19 0900  WBC 21.0*  --  9.1  --  10.5 11.9*  --   --  18.8*  NEUTROABS  --   --  8.2*  --   --   --   --   --   --   HGB 14.3   < > 12.2   < > 11.9* 10.2* 10.2* 10.9* 11.0*  HCT 44.6   < > 37.1   < >  32.7* 31.6* 30.0* 32.0* 34.2*  MCV 93.9  --  92.3  --  94.0 92.9  --   --  91.7  PLT 305  --  228  --  269 169  --   --  205   < > = values in this interval not displayed.     Coagulation Studies: No results for input(s): LABPROT, INR in the last 72 hours.  Imaging CT head without contrast 03/30/2019: No acute abnormality.  ASSESSMENT AND PLAN: 34 year old female who presented after suicidal overdose on Wellbutrin.  Was noted to be in status epilepticus and started on Versed, propofol.  Status epilepticus has since resolved, patient currently on Keppra.  Status epilepticus (resolved) with provoked seizures Suicidal overdose Acute toxic metabolic encephalopathy (improving) Acute respiratory failure (resolved) Bipolar disorder -Status epilepticus in the setting of Wellbutrin overdose, no further seizures.  Of sedation.  Recommendations - Will reduce Keppra to 1000 mg twice daily.  Her seizures were likely provoked in the setting of Wellbutrin overdose, patient will most likely not require long-term antiepileptics. -We will discontinue LTM EEG as patient continues to be seizure-free -We will obtain MRI brain without contrast today to look for any intracranial abnormalities. -Continue seizure precautions.  Patient cannot drive for 6 months/until cleared by physician after discharge. -As needed IV Ativan 2 mg for generalized tonic-clonic seizure lasting more than 2 minutes, focal seizure lasting more than 5 minutes -Management of other comorbidities per primary team -Follow-up with neurology in 8 to 12 weeks, at that point can consider weaning off Ledbetter.   I have spent a total of  35 minutes  with the patient reviewing hospital notes,  test results, labs and examining the patient as well as establishing an assessment and plan that was discussed personally with the patient.  > 50% of time was spent in direct patient care.    Carmen Daniels [name]

## 2019-04-02 NOTE — Progress Notes (Signed)
EEG LTM complete. Skin redness located at T3 F7 F8

## 2019-04-02 NOTE — Progress Notes (Signed)
Nutrition Follow-up  DOCUMENTATION CODES:   Not applicable  INTERVENTION:   Monitor for diet advancement vs re-intubation and support as appropriate.    NUTRITION DIAGNOSIS:   Inadequate oral intake related to inability to eat as evidenced by NPO status. Ongoing  GOAL:   Patient will meet greater than or equal to 90% of their needs Progressing.   MONITOR:   TF tolerance, Vent status  REASON FOR ASSESSMENT:   Consult, Ventilator Enteral/tube feeding initiation and management  ASSESSMENT:   Pt with PMH of bipolar, OCD, depression, and cocaine abuse admitted 2/4 after OD.    Pt discussed during ICU rounds and with RN.   2/7 extubated 2/8 pt with worsening hypoxia overnight with worsening bilateral interstitial and airspace opacities in both lungs, likely from aspiration. Per CCM may still need re-intubation.  Remains NPO due to respiratory status.   Medications reviewed and include: solumedrol  Labs reviewed: K+ 3.4 (L)      Diet Order:   Diet Order            Diet NPO time specified  Diet effective now              EDUCATION NEEDS:   No education needs have been identified at this time  Skin:  Skin Assessment: (cellulitis R hand)  Last BM:  0 output via rectal tube last 48 hrs  Height:   Ht Readings from Last 1 Encounters:  03/29/19 5\' 10"  (1.778 m)    Weight:   Wt Readings from Last 1 Encounters:  03/31/19 84.7 kg    Ideal Body Weight:  68.1 kg  BMI:  Body mass index is 26.79 kg/m.  Estimated Nutritional Needs:   Kcal:  2000-2200  Protein:  110-130 grams  Fluid:  2 L/day  Lockie Pares., RD, LDN, CNSC See AMiON for contact information

## 2019-04-02 NOTE — Progress Notes (Signed)
PCCM interval progress note:  Called to the bedside with Dr. Gilford Raid to evaluate respiratory status.  Pt was extubated yesterday now with increasing oxygen demand worsening infiltrates on CXR.  On evaluation pt is awake and not in distress with decreased air movement in the bilateral bases but no wheezing.  Sats increased to mid-90%'s on 6L HF, no heated high flow units available per RT.  Pt is slightly agitated, though answering questions and following commands.      Plan (discussed with Dr. Gilford Raid) -no emergent need for re-intubation at this time -? 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 Volume overload vs. Possible DAH with rapid onset new infiltrates in the setting of crack use -RVP and Covid-19 negative on admission, covered with Clindamycin for aspiration -Make NPO and start aspiration precautions  -start Solumedrol 125mg  x1 then 60mg  q6hrs,  -Lasix 40mg  x1 -steroids may possibly exacerbate some of her underlying psych issues, however pt remains on precedex gtt for mitigation and 1:1 sitter.  Otilio Carpen Nethra Mehlberg, PA-C

## 2019-04-02 NOTE — Progress Notes (Signed)
[facility] Progress Note Patient Name: Carmen Daniels DOB: 1985/03/20 MRN: [id number]   Date of Service  04/02/2019  HPI/Events of Note  K 3.2  eICU Interventions  Replete with 40 mEq KCl IV.     Intervention Category Minor Interventions: Electrolytes abnormality - evaluation and management  Marily Lente Aracely Rickett 04/02/2019, 7:07 AM

## 2019-04-02 NOTE — Procedures (Signed)
Patient Name:Carmen Daniels [id number] Epilepsy Attending:[name] [name] Referring Physician/Provider:Dr. Kathrynn Speed Duration:04/01/2019 2315to2/09/2019 2315  Patient history:35 yo F who presented with ams. Likelysuicidal overdose with dilated pupils and intermittent myoclonus-like activity.EEG to evaluate for seizures  Level of alertness:awake, asleep  AEDs during EEG study: keppra  Technical aspects: This EEG study was done with scalp electrodes positioned according to the 10-20 International system of electrode placement. Electrical activity was acquired at a sampling rate of 500Hz  and reviewed with a high frequency filter of 70Hz  and a low frequency filter of 1Hz . EEG data were recorded continuously and digitally stored.  Description: During awake state, no clear posterior dominant rhythm was seen. Sleep was characterized by vertex waves, sleep spindles (12-14hz ), maximal frontocentral, REM sleep. EEG showed continuous generalized 2 to 3 Hz delta activity with overriding15 to 18 Hz, 2-3 uV beta activity with irregular morphology distributed symmetrically and diffusely. Hyperventilation and photic stimulation were not performed.  After around 3 AM, significant electrode artifact was seen making it difficult interpret the study.  Abnormality - Continuous slow, generalized - Excessive beta, generalized  IMPRESSION: This study showed mild to moderate diffuse encephalopathy, nonspecific to etiology but most likely secondary to sedation. The excessive beta activity seen in the background is most likely due to the effect of benzodiazepine and is a benign EEG pattern. No seizures were seen throughout the recording.  Rowen Hur [name]

## 2019-04-02 NOTE — Progress Notes (Signed)
Updated mom  & sister  Persistent high WOB on HFNC & bipap reintubated Vent & sedation orders give CXR & ABG pending  Addn cc time x 30 m independent of procedure  Zoee Heeney V. Elsworth Soho MD

## 2019-04-02 NOTE — Progress Notes (Signed)
PT Cancellation Note  Patient Details Name: JEYLI POPPA MRN: [id number] DOB: April 04, 1985   Cancelled Treatment:    Reason Eval/Treat Not Completed: Medical issues which prohibited therapy; RN reports on Bipap and may need re-intubation.  Will check back another day.    Reginia Naas 04/02/2019, 12:59 PM  Magda Kiel, [place] [phone] 04/02/2019

## 2019-04-02 NOTE — Progress Notes (Signed)
Pharmacy Antibiotic Note  Carmen Daniels is a 34 y.o. female admitted on 03/28/2019 with pneumonia.  Pharmacy has been consulted for Levaquin dosing. Also started on Flagyl per MD. Noted "anaphylaxis" allergy documented with Keflex and no history of beta lactam usage noted in chart. Flagyl allergy is "diarrhea." Patient previously on [place] this admit. SCr 0.74 stable. QTC 410.  Plan: Levaquin 750mg  IV q24h Flagyl 500mg  IV q8h per MD Monitor clinical progress, c/s, renal function F/u de-escalation plan/LOT  Height: 5\' 10"  (177.8 cm)(measured pt x3) Weight: 186 lb 11.7 oz (84.7 kg) IBW/kg (Calculated) : 68.5  Temp (24hrs), Avg:98.9 F (37.2 C), Min:98.1 F (36.7 C), Max:99.6 F (37.6 C)  Recent Labs  Lab 03/28/19 2125 03/28/19 2125 03/29/19 0257 03/29/19 1206 03/29/19 1816 03/30/19 0524 03/30/19 1000 03/31/19 0444 03/31/19 1027 04/02/19 0900  WBC 21.0*  --   --   --  9.1  --  10.5 11.9*  --  18.8*  CREATININE 0.81   < > 0.76  --  0.65  --  0.72 0.52  --  0.74  LATICACIDVEN  --   --   --  4.1* 3.7* 4.2*  --   --  1.4  --    < > = values in this interval not displayed.    Estimated Creatinine Clearance: 117.3 mL/min (by C-G formula based on SCr of 0.74 mg/dL).    Allergies  Allergen Reactions  . Keflex [Cephalexin] Anaphylaxis  . Flagyl [Metronidazole] Diarrhea    Elicia Lamp, PharmD, BCPS Please check AMION for all [facility] contact numbers Clinical Pharmacist 04/02/2019 11:30 AM

## 2019-04-02 NOTE — Progress Notes (Signed)
RT obtained ABG on pt with the following results. Pt placed on HFNC [place] 15 Lpm. PaO2 on ABG was 45. ABG drawn from pts [place] while on [place] 6 Lpm. RT will continue to monitor.    Results for Carmen Daniels, Carmen Daniels (MRN [id number]) as of 04/02/2019 00:08  Ref. Range 04/01/2019 23:17  Sample type Unknown ARTERIAL  pH, Arterial Latest Ref Range: 7.350 - 7.450  7.449  pCO2 arterial Latest Ref Range: 32.0 - 48.0 mmHg 31.3 (L)  pO2, Arterial Latest Ref Range: 83.0 - 108.0 mmHg 45.0 (L)  TCO2 Latest Ref Range: 22 - 32 mmol/L 23  Acid-base deficit Latest Ref Range: 0.0 - 2.0 mmol/L 2.0  Bicarbonate Latest Ref Range: 20.0 - 28.0 mmol/L 21.7  O2 Saturation Latest Units: % 83.0  Patient temperature Unknown 98.6 F  Collection site Unknown ARTERIAL LINE

## 2019-04-02 NOTE — Progress Notes (Addendum)
NAME:  Carmen Daniels, MRN:  [id number], DOB:  07-26-1985, LOS: 4 ADMISSION DATE:  03/28/2019, CONSULTATION DATE: 03/29/2019 REFERRING MD:  Dr. Leonette Monarch, ED, CHIEF COMPLAINT:  AMS   Brief History   34 yo female smoker with hx of Bipolar and OCD found with altered mental status from presumed intentional overdose.  Empty bottles of lithium, ambien, flexeril and wellbutrin found. Developed status epilepticus.  Required intubation for airway protection and treatment of status. Extubated 2/7.  Past Medical History  Bipolar, depression, OCD, SI, ? Cocaine abuse  Significant Hospital Events   2/04 Admit 2/05 start burst suppression 2/6; stable, awake and alert today 2/7 Extubated   Consults:  Neurology  Procedures:  ETT 2/04 >> 2/7 Rt IJ CVL 2/04 >>   Significant Diagnostic Tests:  CT head 2/05 >> no acute findings Echo 2/05 >>  1. Left ventricular ejection fraction, by visual estimation, is 55 to 60%. The left ventricle has normal function. There is no left ventricular hypertrophy. Normal diastolic function  2. Global right ventricle has normal systolic function.The right ventricular size is normal.  3. Left atrial size was normal.  4. Right atrial size was normal.  5. The mitral valve is normal in structure. No evidence of mitral valve regurgitation.  6. The tricuspid valve is normal in structure. Tricuspid valve regurgitation is trivial.  7. The aortic valve is tricuspid. Aortic valve regurgitation is not visualized. No evidence of aortic valve sclerosis or stenosis.  8. The pulmonic valve was not well visualized. Pulmonic valve regurgitation is not visualized.  9. Normal pulmonary artery systolic pressure.   Micro Data:  SARS CoV2 PCR 2/03 >> negative Influenza PCR 2/03 >> negative Blood 2/04 x1 >> Sputum 2/8 >>  Antimicrobials:  Clindamycin 2/04 >>  Diflucan 2/04 >> 2/4  Interim history/subjective:  Extubated yesterday, but worsening hypoxia overnight with tenuous  respiratory status, CXR with worsening bilateral opacities - started on solumedrol and diureses started  tmax 98.6 Remains on NRB Ongoing LTM Diuresing well  Barely slept last night Remains on precedex 0.6  Objective   Blood pressure (!) 153/85, pulse (!) 108, temperature 98.6 F (37 C), temperature source Oral, resp. rate (!) 42, height 5\' 10"  (1.778 m), weight 84.7 kg, SpO2 91 %, not currently breastfeeding.    Vent Mode: PSV FiO2 (%):  [30 %-100 %] 100 % PEEP:  [5 cmH20] 5 cmH20 Pressure Support:  [5 cmH20] 5 cmH20   Intake/Output Summary (Last 24 hours) at 04/02/2019 0750 Last data filed at 04/02/2019 0700 Gross per 24 hour  Intake 2313.5 ml  Output 8550 ml  Net -6236.5 ml   Filed Weights   03/29/19 1300 03/30/19 0500 03/31/19 0500  Weight: 83.3 kg 84.9 kg 84.7 kg    Examination: General:  Ill appearing young female lying in bed, mild distress HEENT: MM pink/moist, pupils 5/reactive Neuro: Easily awakens, oriented to self/ place, MAE CV: RR, no mumur PULM:  tachypneic in the 30-40's, shallow, lungs clear anteriorly, diminished in bases GI: soft, bs+, non tender, foley   Extremities: warm/dry, no LE edema, right hand without erythema/ warmth  Skin: no rashes, numerous tattoos   Resolved Hospital Problem list   Hypotension related to sedation Prolonged QTc Right hand cellulitis    Assessment & Plan:   Acute respiratory insufficiency in the setting of SE Hypoxic respiratory failure  - worsening bilateral infiltrates - possibly pulmonary edema vs infiltrates vs pneumonitis (likely aspiration), possibly developing ARDS  tenuous airway, high risk for re-intubation/  aspiration Trail of HFNC- but she is a mouth breather, changed to BiPAP  Goal FiO2 > 92% Prn ABG- reassuring thus far, resp alkalosis with hypoxia  CXR in am  S/p lasix 40 x 2 Strict I/Os Continue solumedrol 60mg  x 6 duonebs prn  Aspiration precautions  continue clindamycin  CBC/ renal panel/ mag  now  Status epilepticus in the setting of overdose- Wellbutrin Per Neurology  Ongoing LTM AEDs per neuro - Keppra Versed prn seizure  Suicidal overdose on Wellbutrin, also noted for possible  Toxic/ metabolic encephalopathy- improving Bipolar, depression, OCD Continue to wean precedex as able for ongoing agitation  Stop Bicarb gtt  Continue bedside safety sitter Will need psych assessment when medically stable   Rt hand cellulitis- improving  Started on clindamycin 2/4  Best practice:  Diet: NPO, will need SLP when stable  DVT prophylaxis: lovenox daily GI prophylaxis: protonix daily Mobility: bed rest for now Code Status: full code Disposition: ICU  Labs    CMP Latest Ref Rng & Units 04/02/2019 04/01/2019 03/31/2019  Glucose 70 - 99 mg/dL - - 105(H)  BUN 6 - 20 mg/dL - - 10  Creatinine 0.44 - 1.00 mg/dL - - 0.52  Sodium 135 - 145 mmol/L 145 148(H) 141  Potassium 3.5 - 5.1 mmol/L 3.2(L) 3.2(L) 3.8  Chloride 98 - 111 mmol/L - - 110  CO2 22 - 32 mmol/L - - 23  Calcium 8.9 - 10.3 mg/dL - - 8.2(L)  Total Protein 6.5 - 8.1 g/dL - - -  Total Bilirubin 0.3 - 1.2 mg/dL - - -  Alkaline Phos 38 - 126 U/L - - -  AST 15 - 41 U/L - - -  ALT 0 - 44 U/L - - -    CBC Latest Ref Rng & Units 04/02/2019 04/01/2019 03/31/2019  WBC 4.0 - 10.5 K/uL - - 11.9(H)  Hemoglobin 12.0 - 15.0 g/dL 10.9(L) 10.2(L) 10.2(L)  Hematocrit 36.0 - 46.0 % 32.0(L) 30.0(L) 31.6(L)  Platelets 150 - 400 K/uL - - 169    ABG    Component Value Date/Time   PHART 7.451 (H) 04/02/2019 0537   PCO2ART 40.1 04/02/2019 0537   PO2ART 87.0 04/02/2019 0537   HCO3 27.7 04/02/2019 0537   TCO2 29 04/02/2019 0537   ACIDBASEDEF 2.0 04/01/2019 2317   O2SAT 97.0 04/02/2019 0537    CBG (last 3)  Recent Labs    04/01/19 1558 04/01/19 2007 04/02/19 0320  GLUCAP 109* 122* 120*   CCT Gulf, MSN, AGACNP-BC [facility] 04/02/2019, 9:21 AM

## 2019-04-02 NOTE — Procedures (Signed)
Intubation Procedure Note TRINA ASCH [id number] 1985-07-01  Procedure: Intubation Indications: Respiratory insufficiency  Procedure Details Consent: Risks of procedure as well as the alternatives and risks of each were explained to the (patient/caregiver).  Consent for procedure obtained. Time Out: Verified patient identification, verified procedure, site/side was marked, verified correct patient position, special equipment/implants available, medications/allergies/relevent history reviewed, required imaging and test results available.  Performed  Pre-sedated with versed 77m, fentanyl 100 mcg, etomidate 233m rocuronium 50 mg  Maximum sterile technique was used including antiseptics, cap, gloves, hand hygiene and mask.  MAC and 3 glidescope Grade 1 view, airway anterior, no secretions ETT 7.5 placed to 24 at lip and secured.  OGT 1636faced with glidescope after intubation.    Evaluation Hemodynamic Status: BP stable throughout; O2 sats: transiently fell during during procedure back to normal  Patient's Current Condition: stable Complications: No apparent complications Patient did tolerate procedure well. Chest X-ray ordered to verify placement.  CXR: pending.  Procedure performed under direct supervision of Dr. AlvElsworth Soho bedside.   BroKennieth RadSN, AGACNP-BC [facility] 04/02/2019, 12:43 PM

## 2019-04-03 ENCOUNTER — Ambulatory Visit (HOSPITAL_BASED_OUTPATIENT_CLINIC_OR_DEPARTMENT_OTHER)
Admission: RE | Admit: 2019-04-03 | Payer: Medicaid Other | Source: Ambulatory Visit | Admitting: Obstetrics and Gynecology

## 2019-04-03 ENCOUNTER — Inpatient Hospital Stay (HOSPITAL_COMMUNITY): Payer: Medicaid Other

## 2019-04-03 HISTORY — DX: Borderline personality disorder: F60.3

## 2019-04-03 HISTORY — DX: Depression, unspecified: F32.A

## 2019-04-03 HISTORY — DX: Chronic pain syndrome: G89.4

## 2019-04-03 HISTORY — DX: Hallucinations, unspecified: R44.3

## 2019-04-03 LAB — COMPREHENSIVE METABOLIC PANEL
ALT: 38 U/L (ref 0–44)
AST: 18 U/L (ref 15–41)
Albumin: 2.1 g/dL — ABNORMAL LOW (ref 3.5–5.0)
Alkaline Phosphatase: 87 U/L (ref 38–126)
Anion gap: 7 (ref 5–15)
BUN: 21 mg/dL — ABNORMAL HIGH (ref 6–20)
CO2: 29 mmol/L (ref 22–32)
Calcium: 8.5 mg/dL — ABNORMAL LOW (ref 8.9–10.3)
Chloride: 102 mmol/L (ref 98–111)
Creatinine, Ser: 0.58 mg/dL (ref 0.44–1.00)
GFR calc Af Amer: >60 mL/min (ref >60)
GFR calc non Af Amer: >60 mL/min (ref >60)
Glucose, Bld: 147 mg/dL — ABNORMAL HIGH (ref 70–99)
Potassium: 4 mmol/L (ref 3.5–5.1)
Sodium: 138 mmol/L (ref 135–145)
Total Bilirubin: 0.4 mg/dL (ref 0.3–1.2)
Total Protein: 5.6 g/dL — ABNORMAL LOW (ref 6.5–8.1)

## 2019-04-03 LAB — PHOSPHORUS: Phosphorus: 2.1 mg/dL — ABNORMAL LOW (ref 2.5–4.6)

## 2019-04-03 LAB — CBC
HCT: 29.5 % — ABNORMAL LOW (ref 36.0–46.0)
Hemoglobin: 9.5 g/dL — ABNORMAL LOW (ref 12.0–15.0)
MCH: 29.9 pg (ref 26.0–34.0)
MCHC: 32.2 g/dL (ref 30.0–36.0)
MCV: 92.8 fL (ref 80.0–100.0)
Platelets: 186 10*3/uL (ref 150–400)
RBC: 3.18 MIL/uL — ABNORMAL LOW (ref 3.87–5.11)
RDW: 13.5 % (ref 11.5–15.5)
WBC: 13.6 10*3/uL — ABNORMAL HIGH (ref 4.0–10.5)
nRBC: 0 % (ref 0.0–0.2)

## 2019-04-03 LAB — CULTURE, BLOOD (SINGLE)
Culture: NO GROWTH
Special Requests: ADEQUATE

## 2019-04-03 LAB — GLUCOSE, CAPILLARY
Glucose-Capillary: 164 mg/dL — ABNORMAL HIGH (ref 70–99)
Glucose-Capillary: 127 mg/dL — ABNORMAL HIGH (ref 70–99)
Glucose-Capillary: 127 mg/dL — ABNORMAL HIGH (ref 70–99)
Glucose-Capillary: 100 mg/dL — ABNORMAL HIGH (ref 70–99)
Glucose-Capillary: 86 mg/dL (ref 70–99)
Glucose-Capillary: 87 mg/dL (ref 70–99)

## 2019-04-03 LAB — TRIGLYCERIDES: Triglycerides: 341 mg/dL — ABNORMAL HIGH (ref <150)

## 2019-04-03 LAB — MAGNESIUM: Magnesium: 2.3 mg/dL (ref 1.7–2.4)

## 2019-04-03 SURGERY — LIGATION, FALLOPIAN TUBE, LAPAROSCOPIC
Anesthesia: Choice | Laterality: Bilateral

## 2019-04-03 MED ORDER — FUROSEMIDE 10 MG/ML IJ SOLN
40.0000 mg | Freq: Once | INTRAMUSCULAR | Status: AC
  Administered 2019-04-03: 40 mg via INTRAVENOUS
  Filled 2019-04-03: qty 4

## 2019-04-03 MED ORDER — CALCIUM GLUCONATE-NACL 1-0.675 GM/50ML-% IV SOLN
1.0000 g | Freq: Once | INTRAVENOUS | Status: AC
  Administered 2019-04-03: 1000 mg via INTRAVENOUS
  Filled 2019-04-03: qty 50

## 2019-04-03 MED ORDER — METHYLPREDNISOLONE SODIUM SUCC 40 MG IJ SOLR
40.0000 mg | Freq: Every day | INTRAMUSCULAR | Status: DC
  Administered 2019-04-04: 40 mg via INTRAVENOUS
  Filled 2019-04-03: qty 1

## 2019-04-03 MED ORDER — POTASSIUM & SODIUM PHOSPHATES 280-160-250 MG PO PACK
2.0000 | PACK | Freq: Three times a day (TID) | ORAL | Status: DC
  Filled 2019-04-03 (×3): qty 2

## 2019-04-03 MED ORDER — POTASSIUM & SODIUM PHOSPHATES 280-160-250 MG PO PACK
2.0000 | PACK | Freq: Three times a day (TID) | ORAL | Status: AC
  Administered 2019-04-03 (×2): 2
  Filled 2019-04-03: qty 2

## 2019-04-03 NOTE — Progress Notes (Signed)
PT Cancellation Note  Patient Details Name: Carmen Daniels MRN: [id number] DOB: March 20, 1985   Cancelled Treatment:    Reason Eval/Treat Not Completed: Patient not medically ready.  Pt agitated and intubated.  Will see as able 2/10. 04/03/2019  Ginger Carne., PT Acute Rehabilitation Services [phone]  (pager) [phone]  (office)   Tessie Fass Adolf [place] 04/03/2019, 5:00 PM

## 2019-04-03 NOTE — Progress Notes (Signed)
OT Cancellation Note  Patient Details Name: DERIANNA GOLDSMITH MRN: [id number] DOB: 03-21-1985   Cancelled Treatment:    Reason Eval/Treat Not Completed: Patient not medically ready(Pt with agitation today and intubated.)  OT to continue to follow-up until medically appropriate.  Jefferey Pica, OTR/L Acute Rehabilitation Services Pager: [phone] Office: [place] 04/03/2019, 11:51 AM

## 2019-04-03 NOTE — Progress Notes (Signed)
NAME:  Carmen Daniels, MRN:  [id number], DOB:  [birth year], LOS: 5 ADMISSION DATE:  03/28/2019, CONSULTATION DATE: 03/29/2019 REFERRING MD:  Dr. Leonette Monarch, ED, CHIEF COMPLAINT:  AMS   Brief History   34 yo female smoker with hx of Bipolar and OCD found with altered mental status from presumed intentional overdose.  Empty bottles of lithium, ambien, flexeril and wellbutrin found. Developed status epilepticus with prolonged QTC, hence presumed culprit was Wellbutrin She required intubation for airway protection and treatment of status. Extubated 2/7.  Past Medical History  Bipolar, depression, OCD, SI, ? Cocaine abuse  Significant Hospital Events   2/04 Admit 2/05 start burst suppression 2/6; stable, awake and alert today 2/7 Extubated  2/8 reintubated for severe hypoxia  Consults:  Neurology  Procedures:  ETT 2/04 >> 2/7 , 2/8 >> Rt IJ CVL 2/04 >>   Significant Diagnostic Tests:  CT head 2/05 >> no acute findings Echo 2/05 >>  1. Left ventricular ejection fraction, by visual estimation, is 55 to 60%. The left ventricle has normal function. There is no left ventricular hypertrophy. Normal diastolic function  2. Global right ventricle has normal systolic function.The right ventricular size is normal.  3. Left atrial size was normal.  4. Right atrial size was normal.  5. The mitral valve is normal in structure. No evidence of mitral valve regurgitation.  6. The tricuspid valve is normal in structure. Tricuspid valve regurgitation is trivial.  7. The aortic valve is tricuspid. Aortic valve regurgitation is not visualized. No evidence of aortic valve sclerosis or stenosis.  8. The pulmonic valve was not well visualized. Pulmonic valve regurgitation is not visualized.  9. Normal pulmonary artery systolic pressure.   Micro Data:  SARS CoV2 PCR 2/03 >> negative Influenza PCR 2/03 >> negative Blood 2/04 x1 >>ng resp 2/8 >>  Antimicrobials:  Clindamycin 2/04 >>  Diflucan 2/04 >>  2/4  Interim history/subjective:   Intubated for hypoxia and work of breathing yesterday Required propofol and fentanyl for sedation and Versed for breakthrough Afebrile last 24 hours Good urine output with Lasix  Objective   Blood pressure 117/70, pulse 73, temperature 97.9 F (36.6 C), temperature source Axillary, resp. rate (!) 21, height 5\' 10"  (1.778 m), weight 84.7 kg, SpO2 99 %, not currently breastfeeding.    Vent Mode: PRVC FiO2 (%):  [40 %-100 %] 40 % Set Rate:  [18 bmp] 18 bmp Vt Set:  [510 mL-540 mL] 540 mL PEEP:  [5 cmH20-8 cmH20] 5 cmH20 Plateau Pressure:  [8 Y026551 cmH20] 19 cmH20   Intake/Output Summary (Last 24 hours) at 04/03/2019 0931 Last data filed at 04/03/2019 0600 Gross per 24 hour  Intake 1776.84 ml  Output 1050 ml  Net 726.84 ml   Filed Weights   03/29/19 1300 03/30/19 0500 03/31/19 0500  Weight: 83.3 kg 84.9 kg 84.7 kg    Examination: General:  Ill appearing young female lying in bed, intubated, no distress HEENT: MM pink/moist, mild pallor, no icterus, no JVD Neuro: Sedated, RASS -2 CV: RR, no mumur PULM: Bilateral ventilated breath sounds, no rhonchi GI: soft, bs+, non tender, foley   Extremities: warm/dry, no LE edema, right hand without erythema/ warmth  Skin: no rashes, numerous tattoos   Chest x-ray 2/9 personally reviewed which shows ET tube in position, bilateral interstitial infiltrates with some consolidation left lower lobe, slight worse than 2/8  Labs show mild hypophosphatemia, decreasing leukocytosis, drop in hemoglobin from 11-9.5  Resolved Hospital Problem list   Hypotension related to  sedation Prolonged QTc Right hand cellulitis    Assessment & Plan:   Acute Hypoxic respiratory failure  - worsening bilateral infiltrates - possibly pulmonary edema vs infiltrates vs pneumonitis (likely aspiration), mild ARDS   -Improved hypoxia although chest x-ray looks slightly worse, decrease PEEP to 5 -Start spontaneous breathing  trials  Status epilepticus in the setting of overdose- Wellbutrin Continue Keppra per neurology  MRI at some point to decide on duration of antiepileptics  Suicidal overdose on Wellbutrin, also noted for possible  Toxic/ metabolic encephalopathy- improved Bipolar, depression, OCD Propofol and fentanyl with goal RASS 0 to -1 Continue bedside safety sitter Will need psych assessment when medically stable   Aspiration pneumonia Rt hand cellulitis- improving  -Levaquin plus Flagyl since 2/8  Hypophosphatemia will be repleted  Best practice:  Diet: NPO, will need SLP when stable  DVT prophylaxis: lovenox daily GI prophylaxis: protonix daily Mobility: bed rest for now Code Status: full code Disposition: ICU Family-sister who is IR doc at [facility] and mom updated  The patient is critically ill with multiple organ systems failure and requires high complexity decision making for assessment and support, frequent evaluation and titration of therapies, application of advanced monitoring technologies and extensive interpretation of multiple databases. Critical Care Time devoted to patient care services described in this note independent of APP/resident  time is 35 minutes.   Kara Mead MD. Shade Flood. [facility]  If no response to pager , please call [phone]   04/03/2019

## 2019-04-04 ENCOUNTER — Inpatient Hospital Stay (HOSPITAL_COMMUNITY): Payer: Medicaid Other

## 2019-04-04 LAB — CBC
HCT: 30.3 % — ABNORMAL LOW (ref 36.0–46.0)
Hemoglobin: 9.7 g/dL — ABNORMAL LOW (ref 12.0–15.0)
MCH: 30.1 pg (ref 26.0–34.0)
MCHC: 32 g/dL (ref 30.0–36.0)
MCV: 94.1 fL (ref 80.0–100.0)
Platelets: 154 10*3/uL (ref 150–400)
RBC: 3.22 MIL/uL — ABNORMAL LOW (ref 3.87–5.11)
RDW: 13.5 % (ref 11.5–15.5)
WBC: 9.2 10*3/uL (ref 4.0–10.5)
nRBC: 0 % (ref 0.0–0.2)

## 2019-04-04 LAB — MAGNESIUM: Magnesium: 1.9 mg/dL (ref 1.7–2.4)

## 2019-04-04 LAB — GLUCOSE, CAPILLARY
Glucose-Capillary: 81 mg/dL (ref 70–99)
Glucose-Capillary: 94 mg/dL (ref 70–99)
Glucose-Capillary: 113 mg/dL — ABNORMAL HIGH (ref 70–99)
Glucose-Capillary: 101 mg/dL — ABNORMAL HIGH (ref 70–99)

## 2019-04-04 LAB — BASIC METABOLIC PANEL
Anion gap: 10 (ref 5–15)
BUN: 22 mg/dL — ABNORMAL HIGH (ref 6–20)
CO2: 28 mmol/L (ref 22–32)
Calcium: 8.3 mg/dL — ABNORMAL LOW (ref 8.9–10.3)
Chloride: 103 mmol/L (ref 98–111)
Creatinine, Ser: 0.63 mg/dL (ref 0.44–1.00)
GFR calc Af Amer: >60 mL/min (ref >60)
GFR calc non Af Amer: >60 mL/min (ref >60)
Glucose, Bld: 99 mg/dL (ref 70–99)
Potassium: 3.1 mmol/L — ABNORMAL LOW (ref 3.5–5.1)
Sodium: 141 mmol/L (ref 135–145)

## 2019-04-04 LAB — PHOSPHORUS: Phosphorus: 2.8 mg/dL (ref 2.5–4.6)

## 2019-04-04 MED ORDER — POTASSIUM CHLORIDE 10 MEQ/50ML IV SOLN
10.0000 meq | INTRAVENOUS | Status: AC
  Administered 2019-04-04 (×3): 10 meq via INTRAVENOUS
  Filled 2019-04-04 (×3): qty 50

## 2019-04-04 MED ORDER — CLONAZEPAM 0.25 MG PO TBDP
1.0000 mg | ORAL_TABLET | Freq: Three times a day (TID) | ORAL | Status: DC
  Administered 2019-04-04 – 2019-04-10 (×17): 1 mg via ORAL
  Filled 2019-04-04: qty 4
  Filled 2019-04-04: qty 2
  Filled 2019-04-04: qty 4
  Filled 2019-04-04: qty 2
  Filled 2019-04-04 (×4): qty 4
  Filled 2019-04-04: qty 2
  Filled 2019-04-04 (×3): qty 4
  Filled 2019-04-04 (×3): qty 2
  Filled 2019-04-04 (×2): qty 4

## 2019-04-04 MED ORDER — FUROSEMIDE 10 MG/ML IJ SOLN
20.0000 mg | Freq: Once | INTRAMUSCULAR | Status: AC
  Administered 2019-04-04: 20 mg via INTRAVENOUS
  Filled 2019-04-04: qty 2

## 2019-04-04 MED ORDER — DEXMEDETOMIDINE HCL IN NACL 400 MCG/100ML IV SOLN
0.4000 ug/kg/h | INTRAVENOUS | Status: DC
  Administered 2019-04-04: 1 ug/kg/h via INTRAVENOUS
  Administered 2019-04-04: 0.6 ug/kg/h via INTRAVENOUS
  Filled 2019-04-04 (×2): qty 100

## 2019-04-04 MED ORDER — POTASSIUM CHLORIDE 20 MEQ/15ML (10%) PO SOLN
40.0000 meq | Freq: Once | ORAL | Status: AC
  Administered 2019-04-04: 40 meq
  Filled 2019-04-04: qty 30

## 2019-04-04 NOTE — Evaluation (Signed)
Occupational Therapy Evaluation Patient Details Name: Carmen Daniels MRN: [id number] DOB: 1986-01-09 Today's Date: 04/04/2019    History of Present Illness Pt is a 34 yo female with  PMHx of Bipolar and OCD found with altered mental status from presumed intentional overdose.   Clinical Impression   Pt PTA: Pt living with family, no longer with spouse. Pt was mostly independent with ADL and mobility.Pt currently  limited by decreased awareness of deficits and safety, decreased activity tolerance, decreased mobility and decreased cognitive ability for tasks. Pt with delayed processing and decreased initiation requiring multimodal cues to follow commands. Pt very weak and unsteady in standing. BP dropped to 83/53 (64), 100% O2 on 3L [place] when standing and returned to sitting and lying down. Pt maxA +2 for bed mobility and modA to maxA+2 for sit to stand with no steps taken. Pt requiring maxA overall for ADL tasks. Pt would greatly benefit from continued OT skilled services. OT following acutely.     Follow Up Recommendations  CIR;Supervision/Assistance - 24 hour    Equipment Recommendations  Other (comment)(TBD)    Recommendations for Other Services Rehab consult     Precautions / Restrictions Precautions Precautions: Fall;Other (comment) Precaution Comments: watch O2, watch BP Restrictions Weight Bearing Restrictions: No      Mobility Bed Mobility Overal bed mobility: Needs Assistance Bed Mobility: Rolling;Supine to Sit;Sit to Supine Rolling: Max assist   Supine to sit: Max assist;+2 for physical assistance Sit to supine: Max assist;+2 for physical assistance   General bed mobility comments: Pt requiring assist for stability and initiating movement  Transfers Overall transfer level: Needs assistance Equipment used: 2 person hand held assist Transfers: Sit to/from Stand Sit to Stand: Max assist;+2 physical assistance         General transfer comment: requires assist for  stability and pt unable to take steps. No safe steps taken. Pt immediately putting head down on IV pole to rest so pt returned to supine.    Balance Overall balance assessment: Needs assistance   Sitting balance-Leahy Scale: Poor     Standing balance support: Bilateral upper extremity supported Standing balance-Leahy Scale: Poor                             ADL either performed or assessed with clinical judgement   ADL Overall ADL's : Needs assistance/impaired Eating/Feeding: NPO   Grooming: Maximal assistance;Sitting;Bed level   Upper Body Bathing: Maximal assistance;Sitting;Bed level   Lower Body Bathing: Maximal assistance;+2 for physical assistance;+2 for safety/equipment;Cueing for safety;Cueing for sequencing;Sitting/lateral leans;Sit to/from stand;Bed level   Upper Body Dressing : Maximal assistance;Sitting;Bed level;Cueing for safety;Cueing for sequencing   Lower Body Dressing: Maximal assistance;+2 for physical assistance;+2 for safety/equipment;Cueing for sequencing;Cueing for safety;Sitting/lateral leans;Sit to/from stand   Toilet Transfer: Maximal assistance;+2 for physical assistance;+2 for safety/equipment;Stand-pivot;BSC   Toileting- Clothing Manipulation and Hygiene: Maximal assistance;+2 for physical assistance;+2 for safety/equipment;Cueing for safety;Sitting/lateral lean;Sit to/from stand       Functional mobility during ADLs: Maximal assistance;+2 for physical assistance;+2 for safety/equipment;Cueing for safety;Cueing for sequencing General ADL Comments: Pt limited by decreased awareness of deficits and safety, decreased activity tolerance, decreased mobility and decreased cognitive tasks.      Vision Baseline Vision/History: No visual deficits Patient Visual Report: No change from baseline Vision Assessment?: No apparent visual deficits     Perception     Praxis      Pertinent Vitals/Pain Pain Assessment: No/denies pain  Hand  Dominance Right   Extremity/Trunk Assessment Upper Extremity Assessment Upper Extremity Assessment: Generalized weakness;RUE deficits/detail;LUE deficits/detail RUE Deficits / Details: 2/5 MM grade weakness RUE Coordination: decreased fine motor;decreased gross motor LUE Deficits / Details: 2/5 MM grade  LUE Coordination: decreased fine motor;decreased gross motor   Lower Extremity Assessment Lower Extremity Assessment: Defer to PT evaluation;Generalized weakness   Cervical / Trunk Assessment Cervical / Trunk Assessment: Normal   Communication Communication Communication: Expressive difficulties;Other (comment)(slow to respond and coughing frequently)   Cognition Arousal/Alertness: Lethargic Behavior During Therapy: WFL for tasks assessed/performed;Flat affect Overall Cognitive Status: Impaired/Different from baseline Area of Impairment: Safety/judgement;Awareness;Attention;Problem solving                   Current Attention Level: Sustained     Safety/Judgement: Decreased awareness of safety;Decreased awareness of deficits Awareness: Intellectual Problem Solving: Slow processing;Decreased initiation;Difficulty sequencing General Comments: Pt unable to sequence to stand    General Comments  BP dropped to 83/53 (64), 100% O2 on 3L [place]. Pt on 3L O2 and >85% O2 with varying wave forms.    Exercises     Shoulder Instructions      Home Living Family/patient expects to be discharged to:: Private residence Living Arrangements: Parent Available Help at Discharge: Family;Available 24 hours/day Type of Home: House Home Access: Stairs to enter [employer] of Steps: "a few"   Home Layout: One level     Bathroom Shower/Tub: Occupational psychologist: Handicapped height                Prior Functioning/Environment Level of Independence: Independent        Comments: not driving        OT Problem List: Decreased strength;Decreased activity  tolerance;Impaired balance (sitting and/or standing);Decreased safety awareness;Impaired UE functional use;Increased edema;Decreased coordination      OT Treatment/Interventions: Self-care/ADL training;Therapeutic exercise;Energy conservation;DME and/or AE instruction;Therapeutic activities;Patient/family education;Balance training;Cognitive remediation/compensation;Visual/perceptual remediation/compensation    OT Goals(Current goals can be found in the care plan section) Acute Rehab OT Goals Patient Stated Goal: to go home OT Goal Formulation: With patient Time For Goal Achievement: 04/18/19 Potential to Achieve Goals: Good ADL Goals Pt Will Perform Lower Body Dressing: with min guard assist;sit to/from stand Pt Will Transfer to Toilet: with min guard assist;ambulating;bedside commode Pt/caregiver will Perform Home Exercise Program: Increased strength;Both right and left upper extremity Additional ADL Goal #1: Pt will perform multi-step commands in 3/5 trials with 100% accuracy.  OT Frequency: Min 2X/week   Barriers to D/C:            Co-evaluation              AM-PAC OT "6 Clicks" Daily Activity     Outcome Measure Help from another person eating meals?: Total Help from another person taking care of personal grooming?: A Lot Help from another person toileting, which includes using toliet, bedpan, or urinal?: Total Help from another person bathing (including washing, rinsing, drying)?: A Lot Help from another person to put on and taking off regular upper body clothing?: A Lot Help from another person to put on and taking off regular lower body clothing?: A Lot 6 Click Score: 10   End of Session Nurse Communication: Mobility status  Activity Tolerance: Patient tolerated treatment well;Patient limited by lethargy Patient left: in bed;with call bell/phone within reach;with bed alarm set  OT Visit Diagnosis: Unsteadiness on feet (R26.81);Muscle weakness (generalized) (M62.81)  Time: [id number] OT Time Calculation (min): 37 min Charges:  OT General Charges $OT Visit: 1 Visit OT Evaluation $OT Eval Moderate Complexity: 1 Mod  Jefferey Pica, OTR/L Acute Rehabilitation Services Pager: [phone] Office: [phone]   Audie Pinto 04/04/2019, 5:00 PM

## 2019-04-04 NOTE — Progress Notes (Signed)
Nutrition Follow-up  RD working remotely.   DOCUMENTATION CODES:   Not applicable  INTERVENTION:  Monitor for diet advancement and order supplements as appropriate.    NUTRITION DIAGNOSIS:   Inadequate oral intake related to inability to eat as evidenced by NPO status.  Ongoing.  GOAL:   Patient will meet greater than or equal to 90% of their needs  Progressing.  MONITOR:   TF tolerance, Vent status  REASON FOR ASSESSMENT:   Consult, Ventilator Enteral/tube feeding initiation and management  ASSESSMENT:   Pt with PMH of bipolar, OCD, depression, and cocaine abuse admitted 2/4 after OD.  2/7 extubated 2/8 re-intubated for severe hypoxia  Discussed pt with RN. Pt was switched to intermittent suction last night and plan is to extubate pt today and to have a speech evaluation.  Medications reviewed and include: lasix, solu-medrol  Labs reviewed: K+ 3.1 (L)  UOP: 2,49ml x24 hours I/O: +3,295.38ml since admit  Patient is currently intubated on ventilator support MV: 6 L/min Temp (24hrs), Avg:98.6 F (37 C), Min:98.1 F (36.7 C), Max:100 F (37.8 C)  Propofol: 25.4 ml/hr, providing 670 kcals.   Diet Order:   Diet Order            Diet NPO time specified  Diet effective now              EDUCATION NEEDS:   No education needs have been identified at this time  Skin:  Skin Assessment: (cellulitis R hand)  Last BM:  04/02/19  Height:   Ht Readings from Last 1 Encounters:  03/29/19 5\' 10"  (1.778 m)    Weight:   Wt Readings from Last 1 Encounters:  03/31/19 84.7 kg    BMI:  Body mass index is 26.79 kg/m.  Estimated Nutritional Needs:   Kcal:  2000-2200  Protein:  110-130 grams  Fluid:  2 L/day   Larkin Ina, MS, RD, LDN RD pager number and weekend/on-call pager number located in [place].

## 2019-04-04 NOTE — Progress Notes (Signed)
NAME:  Carmen Daniels, MRN:  [id number], DOB:  1985/06/08, LOS: 6 ADMISSION DATE:  03/28/2019, CONSULTATION DATE: 03/29/2019 REFERRING MD:  Dr. Leonette Monarch, ED, CHIEF COMPLAINT:  AMS   Brief History   34 yo female smoker with hx of Bipolar and OCD found with altered mental status from presumed intentional overdose.  Empty bottles of lithium, ambien, flexeril and wellbutrin found. Developed status epilepticus with prolonged QTC, hence presumed culprit was Wellbutrin She required intubation for airway protection and treatment of status. Extubated 2/7.  Past Medical History  Bipolar, depression, OCD, SI, ? Cocaine abuse  Significant Hospital Events   2/04 Admit 2/05 start burst suppression 2/6; stable, awake and alert today 2/7 Extubated  2/8 reintubated for severe hypoxia  Consults:  Neurology  Procedures:  ETT 2/04 >> 2/7 , 2/8 >> Rt IJ CVL 2/04 >>   Significant Diagnostic Tests:  CT head 2/05 >> no acute findings Echo 2/05 >>  1. Left ventricular ejection fraction, by visual estimation, is 55 to 60%. The left ventricle has normal function. There is no left ventricular hypertrophy. Normal diastolic function  2. Global right ventricle has normal systolic function.The right ventricular size is normal.  3. Left atrial size was normal.  4. Right atrial size was normal.  5. The mitral valve is normal in structure. No evidence of mitral valve regurgitation.  6. The tricuspid valve is normal in structure. Tricuspid valve regurgitation is trivial.  7. The aortic valve is tricuspid. Aortic valve regurgitation is not visualized. No evidence of aortic valve sclerosis or stenosis.  8. The pulmonic valve was not well visualized. Pulmonic valve regurgitation is not visualized.  9. Normal pulmonary artery systolic pressure.   Micro Data:  SARS CoV2 PCR 2/03 >> negative Influenza PCR 2/03 >> negative Blood 2/04 x1 >>ng resp 2/8 >>  Antimicrobials:  Clindamycin 2/04 >>  Diflucan 2/04 >>  2/4 levaquin 2/8 >> Flagyl 2/8 >>  Interim history/subjective:   Critically ill, intubated, wide-awake on propofol and fentanyl Good urine output with Lasix Low-grade fevers Minimal secretions  Objective   Blood pressure 120/62, pulse 75, temperature 100 F (37.8 C), temperature source Axillary, resp. rate 10, height 5\' 10"  (1.778 m), weight 84.7 kg, SpO2 100 %, not currently breastfeeding.    Vent Mode: PSV;CPAP FiO2 (%):  [40 %] 40 % Set Rate:  [18 bmp] 18 bmp Vt Set:  [540 mL] 540 mL PEEP:  [5 cmH20] 5 cmH20 Pressure Support:  [10 cmH20] 10 cmH20 Plateau Pressure:  [17 cmH20-20 cmH20] 20 cmH20   Intake/Output Summary (Last 24 hours) at 04/04/2019 0859 Last data filed at 04/04/2019 0800 Gross per 24 hour  Intake 2736.81 ml  Output 2725 ml  Net 11.81 ml   Filed Weights   03/29/19 1300 03/30/19 0500 03/31/19 0500  Weight: 83.3 kg 84.9 kg 84.7 kg    Examination: General: Acutely ill appearing young female lying in bed, intubated, no distress HEENT: MM pink/moist, mild pallor, no icterus, no JVD Neuro: Sedated, RASS 0 on fentanyl and propofol CV: RR, no mumur PULM: Bilateral ventilated breath sounds, no rhonchi GI: soft, bs+, non tender, foley   Extremities: warm/dry, no LE edema, right hand erythema has resolved Skin: no rashes, numerous tattoos   Chest x-ray 2/10 personally reviewed which shows much improved bilateral interstitial infiltrates  Labs show hypokalemia, no leukocytosis, stable anemia  Resolved Hospital Problem list   Hypotension related to sedation Prolonged QTc Right hand cellulitis    Assessment & Plan:  Acute Hypoxic respiratory failure  -Improved bilateral infiltrates -suggestive of pneumonitis (likely aspiration), less likely pulmonary edema  -Improved chest x-ray -Start spontaneous breathing trials with goal extubation  Status epilepticus in the setting of overdose- Wellbutrin Continue Keppra per neurology  MRI at some point to decide  on duration of antiepileptics  Suicidal overdose on Wellbutrin, also noted for possible  Toxic/ metabolic encephalopathy- improved Bipolar, depression, OCD Precedex and fentanyl with goal RASS 0 to -1 -can DC once extubated Continue bedside safety sitter Will need psych assessment when medically stable   Aspiration pneumonia Rt hand cellulitis- improving  -Levaquin plus Flagyl x 5 days  Hypokalemia will be repleted aggressively, repeat dose of 20 mg Lasix  Best practice:  Diet: NPO, will need SLP when stable  DVT prophylaxis: lovenox daily GI prophylaxis: protonix daily Mobility: bed rest for now Code Status: full code Disposition: ICU Family-sister who is IR doc at [facility] and mom updated  The patient is critically ill with multiple organ systems failure and requires high complexity decision making for assessment and support, frequent evaluation and titration of therapies, application of advanced monitoring technologies and extensive interpretation of multiple databases. Critical Care Time devoted to patient care services described in this note independent of APP/resident  time is 35 minutes.    Kara Mead MD. Shade Flood. [facility]  If no response to pager , please call [phone]   04/04/2019

## 2019-04-04 NOTE — Procedures (Signed)
Extubation Procedure Note  Patient Details:   Name: Carmen Daniels DOB: [birth year] MRN: [id number]   Airway Documentation:    Vent end date: 04/04/19 Vent end time: 1155   Evaluation  O2 sats: stable throughout Complications: No apparent complications Patient did tolerate procedure well. Bilateral Breath Sounds: Clear   Yes   Patient extubated to 6L nasal cannula per MD order.  Positive cuff leak noted.  No evidence of stridor.  Patient able to speak post extubation.  Sats and vitals currently stable.  No complications noted at this time.  Will continue to monitor.   Judith Part 04/04/2019, 12:29 PM

## 2019-04-04 NOTE — Evaluation (Signed)
Physical Therapy Evaluation Patient Details Name: Carmen Daniels MRN: [id number] DOB: 01/25/1986 Today's Date: 04/04/2019   History of Present Illness  Pt is a 34 yo female with  PMHx of Bipolar and OCD found with altered mental status from presumed intentional overdose.  Clinical Impression  Pt admitted with/for overdose.  Pt suffered seizure on admission.  Today, pt needed max assist of 1-2 persons for basic mobility, but hopefully will do better when BP's are better managed..  Pt currently limited functionally due to the problems listed. ( See problems list.)   Pt will benefit from PT to maximize function and safety in order to get ready for next venue listed below.     Follow Up Recommendations CIR;Supervision/Assistance - 24 hour    Equipment Recommendations  Other (comment)(TBA)    Recommendations for Other Services Rehab consult     Precautions / Restrictions Precautions Precautions: Fall;Other (comment) Precaution Comments: watch O2, watch BP Restrictions Weight Bearing Restrictions: No      Mobility  Bed Mobility Overal bed mobility: Needs Assistance Bed Mobility: Rolling;Supine to Sit;Sit to Supine Rolling: Max assist   Supine to sit: Max assist;+2 for physical assistance Sit to supine: Max assist;+2 for physical assistance   General bed mobility comments: Pt requiring assist for stability and initiating movement  Transfers Overall transfer level: Needs assistance Equipment used: 2 person hand held assist Transfers: Sit to/from Stand Sit to Stand: Max assist;+2 physical assistance         General transfer comment: requires assist for stability and pt unable to take steps. No safe steps taken. Pt immediately putting head down on IV pole to rest so pt returned to supine.  Ambulation/Gait             General Gait Details: pt unable to take steps, due to general weakness and plunging BP's  Stairs            Wheelchair Mobility    Modified  Rankin (Stroke Patients Only)       Balance Overall balance assessment: Needs assistance Sitting-balance support: (needing UE's) Sitting balance-Leahy Scale: Poor     Standing balance support: Bilateral upper extremity supported Standing balance-Leahy Scale: Poor                               Pertinent Vitals/Pain Pain Assessment: No/denies pain    Home Living Family/patient expects to be discharged to:: Private residence Living Arrangements: Parent Available Help at Discharge: Family;Available 24 hours/day Type of Home: House Home Access: Stairs to enter   [employer] of Steps: "a few" Home Layout: One level        Prior Function Level of Independence: Independent         Comments: not driving     Hand Dominance   Dominant Hand: Right    Extremity/Trunk Assessment   Upper Extremity Assessment Upper Extremity Assessment: Generalized weakness RUE Deficits / Details: 2/5 MM grade weakness RUE Coordination: decreased fine motor;decreased gross motor LUE Deficits / Details: 2/5 MM grade  LUE Coordination: decreased fine motor;decreased gross motor    Lower Extremity Assessment Lower Extremity Assessment: Generalized weakness;RLE deficits/detail;LLE deficits/detail RLE Deficits / Details: grossly >3+/5, able to stand, but weak kneed LLE Deficits / Details: same as R LE    Cervical / Trunk Assessment Cervical / Trunk Assessment: Normal  Communication   Communication: Expressive difficulties;Other (comment)(slow to respond and coughing frequently)  Cognition Arousal/Alertness: Lethargic Behavior During  Therapy: WFL for tasks assessed/performed;Flat affect Overall Cognitive Status: Impaired/Different from baseline Area of Impairment: Safety/judgement;Awareness;Attention;Problem solving                   Current Attention Level: Sustained     Safety/Judgement: Decreased awareness of safety;Decreased awareness of  deficits Awareness: Intellectual Problem Solving: Slow processing;Decreased initiation;Difficulty sequencing General Comments: Pt unable to sequence to stand       General Comments General comments (skin integrity, edema, etc.): BP dropped to 83/53 (64), 100% O2 on 3L [place]. Pt on 3L O2 and >85% O2 with varying wave forms.    Exercises     Assessment/Plan    PT Assessment Patient needs continued PT services  PT Problem List Decreased strength;Decreased activity tolerance;Decreased balance;Decreased mobility;Decreased coordination;Decreased knowledge of use of DME       PT Treatment Interventions DME instruction;Gait training;Stair training;Functional mobility training;Therapeutic activities;Balance training;Patient/family education    PT Goals (Current goals can be found in the Care Plan section)  Acute Rehab PT Goals Patient Stated Goal: to go home PT Goal Formulation: With patient Time For Goal Achievement: 04/18/19 Potential to Achieve Goals: Good    Frequency Min 3X/week   Barriers to discharge        Co-evaluation               AM-PAC PT "6 Clicks" Mobility  Outcome Measure Help needed turning from your back to your side while in a flat bed without using bedrails?: A Lot Help needed moving from lying on your back to sitting on the side of a flat bed without using bedrails?: Total Help needed moving to and from a bed to a chair (including a wheelchair)?: Total Help needed standing up from a chair using your arms (e.g., wheelchair or bedside chair)?: Total Help needed to walk in hospital room?: Total Help needed climbing 3-5 steps with a railing? : Total 6 Click Score: 7    End of Session   Activity Tolerance: Patient tolerated treatment well Patient left: in bed;with call bell/phone within reach;with family/visitor present Nurse Communication: Mobility status PT Visit Diagnosis: Unsteadiness on feet (R26.81);Other abnormalities of gait and mobility  (R26.89);Muscle weakness (generalized) (M62.81)    Time: [id number] PT Time Calculation (min) (ACUTE ONLY): 36 min   Charges:   PT Evaluation $PT Eval Moderate Complexity: 1 Mod          04/04/2019  Ginger Carne., PT Acute Rehabilitation Services [phone]  (pager) [phone]  (office)  Tessie Fass Santanna Whitford 04/04/2019, 5:26 PM

## 2019-04-04 NOTE — Progress Notes (Signed)
Report given to [place], [place]. Patient in MRI. Will assess upon return to unit. Thayer Ohm D

## 2019-04-04 NOTE — Progress Notes (Signed)
eLink Physician-Brief Progress Note Patient Name: Carmen Daniels DOB: Jul 10, 1985 MRN: [id number]   Date of Service  04/04/2019  HPI/Events of Note  Inadequate sedation on Propofol and Fentanyl because of concerns about interference with planned a.m. extubation. PT recently re-intubated.  eICU Interventions  Discontinue Propofol, Precedex infusion as overnight bridge to planned extubation.        Kerry Kass Asmara Backs 04/04/2019, 1:09 AM

## 2019-04-04 NOTE — Consult Note (Signed)
  Spoke with patients' [place], RN informed that patient back from procedure not feeling well, woozy, sore throat, and doesn't want to do tele psych at this time.  Informed that would attempt to do tele psych tomorrow.

## 2019-04-04 NOTE — Progress Notes (Addendum)
Rehab Admissions Coordinator Note:  Per OT recommendation, this patient was screened by Raechel Ache for appropriateness for an Inpatient Acute Rehab Consult.  At this time, we are recommending an Inpatient Rehab consult. AC will contact MD to request consult order.   Raechel Ache 04/04/2019, 5:03 PM  I can be reached at [phone].  Addendum 5:17PM: Per Dr. Elsworth Soho, pt is awaiting a psych evaluation which may determine her dispo.   Will follow at a distance to determine if CIR consult needed.   Raechel Ache, OTR/L  Rehab Admissions Coordinator  [phone] 04/04/2019 5:17 PM

## 2019-04-04 NOTE — Progress Notes (Signed)
RT note: patient placed on CPAP/PSV of 10/5 at 0820.  Currently tolerating well.  Will continue to monitor.

## 2019-04-05 ENCOUNTER — Inpatient Hospital Stay (HOSPITAL_COMMUNITY): Payer: Medicaid Other

## 2019-04-05 LAB — BASIC METABOLIC PANEL
Anion gap: 13 (ref 5–15)
BUN: 20 mg/dL (ref 6–20)
CO2: 27 mmol/L (ref 22–32)
Calcium: 8.8 mg/dL — ABNORMAL LOW (ref 8.9–10.3)
Chloride: 97 mmol/L — ABNORMAL LOW (ref 98–111)
Creatinine, Ser: 0.63 mg/dL (ref 0.44–1.00)
GFR calc Af Amer: >60 mL/min (ref >60)
GFR calc non Af Amer: >60 mL/min (ref >60)
Glucose, Bld: 72 mg/dL (ref 70–99)
Potassium: 3.4 mmol/L — ABNORMAL LOW (ref 3.5–5.1)
Sodium: 137 mmol/L (ref 135–145)

## 2019-04-05 LAB — CBC
HCT: 32.1 % — ABNORMAL LOW (ref 36.0–46.0)
Hemoglobin: 10.4 g/dL — ABNORMAL LOW (ref 12.0–15.0)
MCH: 30 pg (ref 26.0–34.0)
MCHC: 32.4 g/dL (ref 30.0–36.0)
MCV: 92.5 fL (ref 80.0–100.0)
Platelets: 173 10*3/uL (ref 150–400)
RBC: 3.47 MIL/uL — ABNORMAL LOW (ref 3.87–5.11)
RDW: 13.1 % (ref 11.5–15.5)
WBC: 11.7 10*3/uL — ABNORMAL HIGH (ref 4.0–10.5)
nRBC: 0 % (ref 0.0–0.2)

## 2019-04-05 LAB — MAGNESIUM: Magnesium: 2 mg/dL (ref 1.7–2.4)

## 2019-04-05 LAB — PHOSPHORUS: Phosphorus: 4.1 mg/dL (ref 2.5–4.6)

## 2019-04-05 MED ORDER — SUMATRIPTAN SUCCINATE 100 MG PO TABS
100.0000 mg | ORAL_TABLET | ORAL | Status: DC | PRN
  Administered 2019-04-05 – 2019-04-09 (×4): 100 mg via ORAL
  Filled 2019-04-05 (×6): qty 1

## 2019-04-05 MED ORDER — POTASSIUM CHLORIDE CRYS ER 20 MEQ PO TBCR
20.0000 meq | EXTENDED_RELEASE_TABLET | Freq: Once | ORAL | Status: AC
  Administered 2019-04-05: 20 meq via ORAL
  Filled 2019-04-05: qty 1

## 2019-04-05 NOTE — Progress Notes (Signed)
NAME:  Carmen Daniels, MRN:  [id number], DOB:  04-19-1985, LOS: 7 ADMISSION DATE:  03/28/2019, CONSULTATION DATE: 03/29/2019 REFERRING MD:  Dr. Leonette Monarch, ED, CHIEF COMPLAINT:  AMS   Brief History   34 yo female smoker with hx of Bipolar and OCD found with altered mental status from presumed intentional overdose.  Empty bottles of lithium, ambien, flexeril and wellbutrin found. Developed status epilepticus with prolonged QTC, hence presumed culprit was Wellbutrin She required intubation for airway protection and treatment of status. Extubated 2/7 but required re-intubation 2/8 for increased WOB.  Extubated 2/10 and tolerated well.  Past Medical History  Bipolar, depression, OCD, SI, ? Cocaine abuse  Significant Hospital Events   2/04 Admit 2/05 start burst suppression 2/6; stable, awake and alert today 2/7 Extubated  2/8 reintubated for severe hypoxia 2/10 extubated  Consults:  Neurology  Procedures:  ETT 2/04 >> 2/7 , 2/8 >> 2/10 Rt IJ CVL 2/04 >>   Significant Diagnostic Tests:  CT head 2/05 >> no acute findings Echo 2/05 >> EF 55-60%. MRI brain 2/10 > no acute abnormalities.  Chronic arachnoid cyst at the anterior middle cranial fossa on the right.  Micro Data:  SARS CoV2 PCR 2/03 >> negative Influenza PCR 2/03 >> negative Blood 2/04 x1 >>ng resp 2/8 >>neg  Antimicrobials:  Clindamycin 2/04 >> 2/7 Diflucan 2/04 >> 2/4 levaquin 2/8 >> Flagyl 2/8 >>  Interim history/subjective:  Tolerated extubation well.  OT / PT recommending CIR but reportedly pt doesn't qualify and [facility] has been recommended.  Objective   Blood pressure 115/64, pulse (!) 106, temperature 98.4 F (36.9 C), resp. rate (!) 28, height 5\' 10"  (1.778 m), weight 84.7 kg, SpO2 96 %, not currently breastfeeding.    Vent Mode: PSV;CPAP FiO2 (%):  [40 %] 40 % PEEP:  [5 cmH20] 5 cmH20 Pressure Support:  [5 cmH20] 5 cmH20   Intake/Output Summary (Last 24 hours) at 04/05/2019 0909 Last data filed at 04/05/2019  0800 Gross per 24 hour  Intake 1010.18 ml  Output 2800 ml  Net -1789.82 ml   Filed Weights   03/29/19 1300 03/30/19 0500 03/31/19 0500  Weight: 83.3 kg 84.9 kg 84.7 kg    Examination: General: Adult female, resting in bed, in NAD. Neuro: A&O x 3, no deficits. HEENT: [place]/AT. Sclerae anicteric. EOMI. Cardiovascular: RRR, no M/R/G.  Lungs: Respirations even and unlabored.  CTA bilaterally, No W/R/R. Abdomen: BS x 4, soft, NT/ND.  Musculoskeletal: No gross deformities, no edema.  Skin: Numerous tattoos.  Intact, warm, no rashes.  Resolved Hospital Problem list   Hypotension related to sedation Prolonged QTc Right hand cellulitis    Assessment & Plan:   Acute hypoxic respiratory failure - 2/2 aspiration + AMS 2/2 status.  Required intubation x 2 (initially extubated 2/7 and re-intubated 2/8, then extubated 2/10 and has tolerated well). - Continue abx as below.  - No further interventions required.  Status epilepticus in the setting of wellbutrin overdose. - Continue Keppra per neurology.  - No driving for at least 6 months and until cleared by neuro after discharge. - F/u with neuro as outpatient.  Suicidal overdose (Wellbutrin). Toxic/ metabolic encephalopathy - gradually resolving. Bipolar, depression, OCD. - Continue bedside sitter / suicide precautions. - Behavioral health assessment completed today, awaiting recs and bed placement.  Aspiration pneumonia - improving. Rt hand cellulitis - improving.  - Continue Levaquin plus Flagyl through 2/12.  Hypokalemia. - 40 mEq K now. - Follow BMP.  Best practice:  Diet: Regular DVT  prophylaxis: lovenox daily GI prophylaxis: protonix daily Mobility: bed rest for now Code Status: full code Disposition: Transfer to med surg today.  Have asked [facility] to assume care in AM 2/12 and PCCM off at that time. Family updates:  Will call mom.   Montey Hora, [facility] 04/05/2019, 9:31  AM

## 2019-04-05 NOTE — Progress Notes (Signed)
[facility] hospitalist brief update note  34 y/o F with PMH bipolar disorder presented with altered mental status found to be in status epilepticus secondary to presumed intentional wellbutrin overdose. Required intubation for airway protection on admission, extubated 2/7; re-intubated 2/8 due to hypoxia thought to be due to aspiration event. Subsequently re-extubated 2/10 and tolerating well. Neurology placed on burst suppression, now on keppra. On levoquin and flagyl for aspiration pneumonia and right hand cellulitis. Per primary team evaluated by psychiatry and will need behavioral health admission. PT recommended CIR who is following but final disposition pending psych evaluation.   [facility] will assume care on 04/06/2019 AM.   Budd Palmer, MD Internal Medicine  Hospitalist

## 2019-04-05 NOTE — Progress Notes (Signed)
Subjective: No acute events overnight.  Patient was extubated.  Denies any concerns at this point.  ROS: negative except above  Examination  Vital signs in last 24 hours: Temp:  [98.1 F (36.7 C)-98.9 F (37.2 C)] 98.8 F (37.1 C) (02/11 1518) Pulse Rate:  [98-120] 99 (02/11 1518) Resp:  [25-36] 28 (02/11 1518) BP: (102-134)/(56-83) 130/80 (02/11 1518) SpO2:  [88 %-100 %] 100 % (02/11 1518)  General: lying in bed, not in apparent distress  CVS: pulse-normal rate and rhythm RS: breathing comfortably, CTAB Extremities: normal, warm  Neuro: MS: Alert, oriented, follows commands CN: pupils equal and reactive,  EOMI, face symmetric, tongue midline, normal sensation over face, Motor: 5/5 strength in all 4 extremities Reflexes: 2+ bilaterally over patella, biceps, plantars: flexor  Basic Metabolic Panel: Recent Labs  Lab 03/31/19 0444 03/31/19 0444 03/31/19 1830 04/01/19 2317 04/02/19 0900 04/02/19 0900 04/02/19 1536 04/02/19 1644 04/03/19 0433 04/04/19 0500 04/05/19 0404  NA 141  --   --    < > 142  --  142  --  138 141 137  K 3.8  --   --    < > 3.4*  --  3.6  --  4.0 3.1* 3.4*  CL 110  --   --   --  100  --   --   --  102 103 97*  CO2 23  --   --   --  29  --   --   --  29 28 27   GLUCOSE 105*  --   --   --  154*  --   --   --  147* 99 72  BUN 10  --   --   --  8  --   --   --  21* 22* 20  CREATININE 0.52  --   --   --  0.74  --   --   --  0.58 0.63 0.63  CALCIUM 8.2*   < >  --   --  8.4*   < >  --   --  8.5* 8.3* 8.8*  MG 1.9  --  1.9  --   --   --   --   --  2.3 1.9 2.0  PHOS 2.3*   < > 1.9*  --  4.1  --   --  2.8 2.1* 2.8 4.1   < > = values in this interval not displayed.    CBC: Recent Labs  Lab 03/29/19 1816 03/29/19 2151 03/31/19 0444 04/01/19 2317 04/02/19 0900 04/02/19 1536 04/03/19 0433 04/04/19 0500 04/05/19 0404  WBC 9.1   < > 11.9*  --  18.8*  --  13.6* 9.2 11.7*  NEUTROABS 8.2*  --   --   --   --   --   --   --   --   HGB 12.2   < > 10.2*    < > 11.0* 10.9* 9.5* 9.7* 10.4*  HCT 37.1   < > 31.6*   < > 34.2* 32.0* 29.5* 30.3* 32.1*  MCV 92.3   < > 92.9  --  91.7  --  92.8 94.1 92.5  PLT 228   < > 169  --  205  --  186 154 173   < > = values in this interval not displayed.     Coagulation Studies: No results for input(s): LABPROT, INR in the last 72 hours.  Imaging MRI brain without contrast 04/04/2019: No abnormality of the  brain parenchyma identified to explain the clinical presentation.  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 I do not see evidence an encephalocele.   ASSESSMENT AND PLAN: 34 year old female who presented after suicidal overdose on Wellbutrin. Was noted to be in status epilepticus and started on Versed, propofol.  Status epilepticus has since resolved, patient currently on Keppra.  Status epilepticus (resolved) with provoked seizures Suicidal overdose Acute toxic metabolic encephalopathy (resolved) Acute respiratory failure (resolved) Bipolar disorder -Status epilepticus in the setting of Wellbutrin overdose, no further seizures off sedation.  Recommendations -  Her seizures were likely provoked in the setting of Wellbutrin overdose, patient will most likely not require long-term antiepileptics.  MRI brain did not show any acute abnormality.  Arachnoid cyst is chronic and is most likely not contributing to seizures at this point. -Recommend weaning off Keppra as follows   Date Keppra AM Keppra PM  04/06/2019- 04/12/2019 750mg   750mg    04/13/2019- 04/19/2019 500mg   500mg    04/20/2019- 04/26/2019 500mg   STOP  04/27/2019 STOP STOP   -Discussed diagnosis, management and weaning off of Keppra with patient's mother -Continue seizure precautions.  Patient cannot drive for 6 months/until cleared by physician after discharge. -As needed IV Ativan 2 mg for generalized tonic-clonic seizure lasting more than 2 minutes, focal seizure lasting  more than 5 minutes while inpatient -Management of other comorbidities per primary team  Thank you for allowing [place] to participate in the care of this patient.  Neurology will sign off.  Please page neuro hospitalist for any further questions after 5 PM.  I have spent a total of 35 minuteswith the patient reviewing hospitalnotes,  test results, labs and examining the patient as well as establishing an assessment and plan that was discussed personally with the patient, her mother and her rest of the critical care team.>50% of time was spent in direct patient care.   Carmen Daniels

## 2019-04-05 NOTE — Progress Notes (Signed)
Assisted tele visit to patient with provider.  Hercules Hasler R, RN  

## 2019-04-05 NOTE — Consult Note (Signed)
Telepsych Consultation   Reason for Consult: Overdose Referring Physician:  Internal Medicine  Location of Patient:  [facility]  Location of Provider: [facility]  Patient Identification: Carmen Daniels MRN:  [id number] Principal Diagnosis: <principal problem not specified> Diagnosis:  Active Problems:   Intentional benzodiazepine overdose ([place])   Bupropion overdose   Acute respiratory failure with hypoxia ([place])   Total Time spent with patient: 15 minutes  Subjective:   Carmen Daniels is a 34 y.o. female was evaluated via teleassessment.  She is awake, alert and oriented x3. " I do not remember what happened."  Currently denying suicidal or homicidal ideations.  Denies auditory or visual hallucinations.  Reports she was followed by a psychiatrist in the past.  Reports a history of self injurious behaviors by cutting.  Reports previous inpatient admissions for suicide attempts.  Patient reports she resides with her parents.  States she has been off her Wellbutrin " I dont remember how long it been".  Patient appears guarded, flat and depressed during this assessment.  Cites mental health history with bipolar and schizoaffective disorder.  NP will recommend inpatient admission once medically cleared. Reported history with substance abuse however did not elaborate.  Case staffed with attending psychiatrist MD Dwyane Dee.  Support encouragement reassurance was provided.  HPI: per admission assessment note: Carmen Daniels is a 33 y.o. female.  Patient arrives by ambulance after being called for altered mental status overdose. Level 5 caveat. Apparently there were prescriptions for lithium Ambien Flexeril Wellbutrin and pills were strewn about the room.  History of crack use.  History of suicidal attempts.  Per EMS she was conscious and able to answer with head shaking but was not speaking.  She was given Narcan for depressed respiratory rate.  She is not answering any questions here to me.   Eyes open, but not interacting  Past Psychiatric History:   Risk to Self:   Risk to Others:   Prior Inpatient Therapy:   Prior Outpatient Therapy:    Past Medical History:  Past Medical History:  Diagnosis Date  . Acute rhinosinusitis 07/03/2018  . Allergic rhinitis   . Anxiety   . Asthma   . Back pain   . Bipolar disorder ([place])   . Borderline personality disorder ([place])   . Chronic pain   . Chronic pain syndrome   . Depression   . Hallucinations   . Migraines   . Nasal polyps   . Nausea   . Neck pain   . OCD (obsessive compulsive disorder)   . OCD (obsessive compulsive disorder)   . Ovarian cyst    left    Past Surgical History:  Procedure Laterality Date  . NASAL SINUS SURGERY    . WISDOM TOOTH EXTRACTION     Family History:  Family History  Problem Relation Age of Onset  . Healthy Father   . Healthy Mother   . Arthritis Maternal Grandmother   . Arthritis Maternal Grandfather   . Depression Paternal Grandmother    Family Psychiatric  History:  Social History:  Social History   Substance and Sexual Activity  Alcohol Use Yes  . Alcohol/week: 0.0 standard drinks   Comment: Occasional alcohol use     Social History   Substance and Sexual Activity  Drug Use No    Social History   Socioeconomic History  . Marital status: Married    Spouse name: Not on file  . Number of children: 0  . Years of  education: 14  . Highest education level: Not on file  Occupational History  . Occupation: Unemployed  Tobacco Use  . Smoking status: Current Every Day Smoker    Packs/day: 0.50    Types: Cigarettes  . Smokeless tobacco: Never Used  Substance and Sexual Activity  . Alcohol use: Yes    Alcohol/week: 0.0 standard drinks    Comment: Occasional alcohol use  . Drug use: No  . Sexual activity: Yes    Birth control/protection: Condom  Other Topics Concern  . Not on file  Social History Narrative   Lives at home alone.   Right-handed.   3 glasses of green  tea per day.   Social Determinants of Health   Financial Resource Strain:   . Difficulty of Paying Living Expenses: Not on file  Food Insecurity:   . Worried About Charity fundraiser in the Last Year: Not on file  . Ran Out of Food in the Last Year: Not on file  Transportation Needs:   . Lack of Transportation (Medical): Not on file  . Lack of Transportation (Non-Medical): Not on file  Physical Activity:   . Days of Exercise per Week: Not on file  . Minutes of Exercise per Session: Not on file  Stress:   . Feeling of Stress : Not on file  Social Connections:   . Frequency of Communication with Friends and Family: Not on file  . Frequency of Social Gatherings with Friends and Family: Not on file  . Attends Religious Services: Not on file  . Active Member of Clubs or Organizations: Not on file  . Attends Archivist Meetings: Not on file  . Marital Status: Not on file   Additional Social History:    Allergies:   Allergies  Allergen Reactions  . Keflex [Cephalexin] Anaphylaxis  . Flagyl [Metronidazole] Diarrhea    Labs:  Results for orders placed or performed during the hospital encounter of 03/28/19 (from the past 48 hour(s))  Glucose, capillary     Status: Abnormal   Collection Time: 04/03/19 12:07 PM  Result Value Ref Range   Glucose-Capillary 127 (H) 70 - 99 mg/dL   Comment 1 Notify RN    Comment 2 Document in Chart   Glucose, capillary     Status: Abnormal   Collection Time: 04/03/19  3:48 PM  Result Value Ref Range   Glucose-Capillary 100 (H) 70 - 99 mg/dL   Comment 1 Notify RN    Comment 2 Document in Chart   Glucose, capillary     Status: None   Collection Time: 04/03/19  7:37 PM  Result Value Ref Range   Glucose-Capillary 86 70 - 99 mg/dL  Glucose, capillary     Status: None   Collection Time: 04/03/19 11:18 PM  Result Value Ref Range   Glucose-Capillary 87 70 - 99 mg/dL  Glucose, capillary     Status: None   Collection Time: 04/04/19  3:11 AM   Result Value Ref Range   Glucose-Capillary 81 70 - 99 mg/dL  Phosphorus     Status: None   Collection Time: 04/04/19  5:00 AM  Result Value Ref Range   Phosphorus 2.8 2.5 - 4.6 mg/dL    Comment: Performed at [facility], [place] [address] [place], [place] [address]  CBC     Status: Abnormal   Collection Time: 04/04/19  5:00 AM  Result Value Ref Range   WBC 9.2 4.0 - 10.5 K/uL   RBC 3.22 (  L) 3.87 - 5.11 MIL/uL   Hemoglobin 9.7 (L) 12.0 - 15.0 g/dL   HCT 30.3 (L) 36.0 - 46.0 %   MCV 94.1 80.0 - 100.0 fL   MCH 30.1 26.0 - 34.0 pg   MCHC 32.0 30.0 - 36.0 g/dL   RDW 13.5 11.5 - 15.5 %   Platelets 154 150 - 400 K/uL   nRBC 0.0 0.0 - 0.2 %    Comment: Performed at [facility] [address] [place], [place] [id number]  Basic metabolic panel     Status: Abnormal   Collection Time: 04/04/19  5:00 AM  Result Value Ref Range   Sodium 141 135 - 145 mmol/L   Potassium 3.1 (L) 3.5 - 5.1 mmol/L   Chloride 103 98 - 111 mmol/L   CO2 28 22 - 32 mmol/L   Glucose, Bld 99 70 - 99 mg/dL   BUN 22 (H) 6 - 20 mg/dL   Creatinine, Ser 0.63 0.44 - 1.00 mg/dL   Calcium 8.3 (L) 8.9 - 10.3 mg/dL   GFR calc non Af Amer >60 >60 mL/min   GFR calc Af Amer >60 >60 mL/min   Anion gap 10 5 - 15    Comment: Performed at [facility], [place] [address], [place], [place] [address]  Magnesium     Status: None   Collection Time: 04/04/19  5:00 AM  Result Value Ref Range   Magnesium 1.9 1.7 - 2.4 mg/dL    Comment: Performed at [facility], [place] [address], [place], [place] [address]  Glucose, capillary     Status: None   Collection Time: 04/04/19  7:50 AM  Result Value Ref Range   Glucose-Capillary 94 70 - 99 mg/dL   Comment 1 Notify RN    Comment 2 Document in Chart   Glucose, capillary     Status: Abnormal   Collection Time: 04/04/19 11:56 AM  Result Value Ref Range   Glucose-Capillary 113 (H) 70 - 99 mg/dL   Comment 1 Notify RN    Comment 2 Document in Chart   Glucose, capillary      Status: Abnormal   Collection Time: 04/04/19  3:19 PM  Result Value Ref Range   Glucose-Capillary 101 (H) 70 - 99 mg/dL   Comment 1 Notify RN    Comment 2 Document in Chart   Phosphorus     Status: None   Collection Time: 04/05/19  4:04 AM  Result Value Ref Range   Phosphorus 4.1 2.5 - 4.6 mg/dL    Comment: Performed at [facility], [place] [address] [place], [place] [id number]  Basic metabolic panel     Status: Abnormal   Collection Time: 04/05/19  4:04 AM  Result Value Ref Range   Sodium 137 135 - 145 mmol/L   Potassium 3.4 (L) 3.5 - 5.1 mmol/L   Chloride 97 (L) 98 - 111 mmol/L   CO2 27 22 - 32 mmol/L   Glucose, Bld 72 70 - 99 mg/dL   BUN 20 6 - 20 mg/dL   Creatinine, Ser 0.63 0.44 - 1.00 mg/dL   Calcium 8.8 (L) 8.9 - 10.3 mg/dL   GFR calc non Af Amer >60 >60 mL/min   GFR calc Af Amer >60 >60 mL/min   Anion gap 13 5 - 15    Comment: Performed at [facility], [place] [address] [place], [place] [address]  Magnesium     Status: None   Collection  Time: 04/05/19  4:04 AM  Result Value Ref Range   Magnesium 2.0 1.7 - 2.4 mg/dL    Comment: Performed at [facility] [address], [place], [place] [address]  CBC     Status: Abnormal   Collection Time: 04/05/19  4:04 AM  Result Value Ref Range   WBC 11.7 (H) 4.0 - 10.5 K/uL   RBC 3.47 (L) 3.87 - 5.11 MIL/uL   Hemoglobin 10.4 (L) 12.0 - 15.0 g/dL   HCT 32.1 (L) 36.0 - 46.0 %   MCV 92.5 80.0 - 100.0 fL   MCH 30.0 26.0 - 34.0 pg   MCHC 32.4 30.0 - 36.0 g/dL   RDW 13.1 11.5 - 15.5 %   Platelets 173 150 - 400 K/uL   nRBC 0.0 0.0 - 0.2 %    Comment: Performed at [facility], [place] [address], [place], [place] [address]    Medications:  Current Facility-Administered Medications  Medication Dose Route Frequency Provider Last Rate Last Admin  . 0.9 %  sodium chloride infusion  250 mL Intravenous Continuous Anders Simmonds, MD   Stopped at 04/04/19 1528  . acetaminophen (TYLENOL) tablet 650 mg  650 mg Oral  Q6H PRN Collier Bullock, MD   650 mg at 04/01/19 1557  . Chlorhexidine Gluconate Cloth 2 % PADS 6 each  6 each Topical Daily Rigoberto Noel, MD   6 each at 04/03/19 0215  . clonazePAM (KLONOPIN) disintegrating tablet 1 mg  1 mg Oral TID Rigoberto Noel, MD   1 mg at 04/04/19 2243  . enoxaparin (LOVENOX) injection 40 mg  40 mg Subcutaneous Daily Rigoberto Noel, MD   40 mg at 04/04/19 0912  . ipratropium-albuterol (DUONEB) 0.5-2.5 (3) MG/3ML nebulizer solution 3 mL  3 mL Nebulization Q4H PRN Chesley Mires, MD   3 mL at 04/01/19 2327  . levETIRAcetam (KEPPRA) IVPB 1000 mg/100 mL premix  1,000 mg Intravenous Q12H Lora Havens, MD   Stopped at 04/04/19 2259  . levofloxacin (LEVAQUIN) IVPB 750 mg  750 mg Intravenous Q24H Rigoberto Noel, MD   Stopped at 04/04/19 1310  . LORazepam (ATIVAN) injection 2 mg  2 mg Intravenous PRN Lora Havens, MD      . metroNIDAZOLE (FLAGYL) IVPB 500 mg  500 mg Intravenous Q8H Rigoberto Noel, MD   Stopped at 04/05/19 [phone]  . ondansetron (ZOFRAN) injection 4 mg  4 mg Intravenous Q6H PRN Jennelle Human B, NP   4 mg at 04/04/19 0006  . pantoprazole (PROTONIX) injection 40 mg  40 mg Intravenous Q24H Jennelle Human B, NP   40 mg at 04/04/19 1042  . sodium chloride flush (NS) 0.9 % injection 10-40 mL  10-40 mL Intracatheter Q12H Jennelle Human B, NP   10 mL at 04/04/19 2023  . sodium chloride flush (NS) 0.9 % injection 10-40 mL  10-40 mL Intracatheter PRN Arnell Asal, NP        Musculoskeletal:   Psychiatric Specialty Exam: Physical Exam  Nursing note and vitals reviewed. Psychiatric: She has a normal mood and affect. Her behavior is normal.    Review of Systems  Blood pressure 122/73, pulse (!) 104, temperature 98.4 F (36.9 C), resp. rate (!) 28, height 5\' 10"  (1.778 m), weight 84.7 kg, SpO2 97 %, not currently breastfeeding.Body mass index is 26.79 kg/m.  General Appearance: Casual  Eye Contact:  Fair  Speech:  Clear and Coherent  Volume:  Normal   Mood:  Anxious and Depressed  Affect:  Congruent  Thought Process:  Coherent  Orientation:  Full (Time, Place, and Person)  Thought Content:  Logical  Suicidal Thoughts:  No  Homicidal Thoughts:  No  Memory:  Immediate;   Fair Recent;   Fair  Judgement:  Fair  Insight:  Fair  Psychomotor Activity:  Normal  Concentration:  Concentration: Fair  Recall:  [employer] of Knowledge:  Fair  Language:  Fair  Akathisia:  No  Handed:  Right  AIMS (if indicated):     Assets:  Communication Skills Desire for Improvement Resilience Social Support  ADL's:  Intact  Cognition:  WNL  Sleep:        Disposition: Recommend psychiatric Inpatient admission when medically cleared.  This service was provided via telemedicine using a 2-way, interactive audio and video technology.  Names of all persons participating in this telemedicine service and their role in this encounter. Name: Jayden Wiggers Role: Patient   Name: T.Sadi Arave Role: NP          Derrill [place], NP 04/05/2019 9:32 AM

## 2019-04-05 NOTE — Progress Notes (Signed)
Physical Therapy Treatment Patient Details Name: Carmen Daniels MRN: [id number] DOB: 1985-11-19 Today's Date: 04/05/2019    History of Present Illness Pt is a 34 yo female with  PMHx of Bipolar and OCD found with altered mental status from presumed intentional overdose.    PT Comments    Pt flat with low interaction, but not resistive to mobility.  Emphasis on transition, sit to stand, standing balance and gait stability.  Pt needed more assist or and AD for improved safety today.    Follow Up Recommendations  CIR;Supervision/Assistance - 24 hour     Equipment Recommendations  Other (comment)(TBA)    Recommendations for Other Services Rehab consult     Precautions / Restrictions Precautions Precautions: Fall;Other (comment) Precaution Comments: watch O2, watch BP Restrictions Weight Bearing Restrictions: No    Mobility  Bed Mobility Overal bed mobility: Needs Assistance Bed Mobility: Supine to Sit;Sit to Sidelying     Supine to sit: Min assist Sit to supine: Min guard   General bed mobility comments: Extra time to move.  Transfers Overall transfer level: Needs assistance   Transfers: Sit to/from Stand Sit to Stand: Mod assist         General transfer comment: cues for hand placement, direction and both stability and boost assist  Ambulation/Gait Ambulation/Gait assistance: Mod assist;+2 safety/equipment(2 person assist or AD needed for safety next session) Gait Distance (Feet): 80 Feet Assistive device: 1 person hand held assist Gait Pattern/deviations: Step-through pattern Gait velocity: slower Gait velocity interpretation: 1.31 - 2.62 ft/sec, indicative of limited community ambulator General Gait Details: pt's gait with uncoordinated, scissoring and mildly ataxic.  Pt's trunk was lower tone and pt needed some assist to stay upright.  Next session will have 2 person assist and or AD.   Stairs             Wheelchair Mobility    Modified Rankin  (Stroke Patients Only)       Balance Overall balance assessment: Needs assistance Sitting-balance support: No upper extremity supported(needing UE's) Sitting balance-Leahy Scale: Fair Sitting balance - Comments: slumped but steady   Standing balance support: Bilateral upper extremity supported;Single extremity supported Standing balance-Leahy Scale: Poor                              Cognition Arousal/Alertness: Lethargic Behavior During Therapy: WFL for tasks assessed/performed;Flat affect Overall Cognitive Status: Impaired/Different from baseline Area of Impairment: Safety/judgement;Awareness;Attention;Problem solving                   Current Attention Level: Sustained     Safety/Judgement: Decreased awareness of safety;Decreased awareness of deficits Awareness: Intellectual Problem Solving: Slow processing;Decreased initiation;Difficulty sequencing General Comments: pt very flat, apathetic and with low interaction      Exercises      General Comments General comments (skin integrity, edema, etc.): BP sitting after ambulation 112/73, sats on RA mid 90's      Pertinent Vitals/Pain Pain Assessment: No/denies pain    Home Living                      Prior Function            PT Goals (current goals can now be found in the care plan section) Acute Rehab PT Goals Patient Stated Goal: to go home PT Goal Formulation: With patient Time For Goal Achievement: 04/18/19 Potential to Achieve Goals: Good Progress towards PT goals: Progressing  toward goals    Frequency    Min 3X/week      PT Plan Current plan remains appropriate    Co-evaluation              AM-PAC PT "6 Clicks" Mobility   Outcome Measure  Help needed turning from your back to your side while in a flat bed without using bedrails?: A Lot Help needed moving from lying on your back to sitting on the side of a flat bed without using bedrails?: A Little Help  needed moving to and from a bed to a chair (including a wheelchair)?: A Lot Help needed standing up from a chair using your arms (e.g., wheelchair or bedside chair)?: A Lot Help needed to walk in hospital room?: A Lot Help needed climbing 3-5 steps with a railing? : Total 6 Click Score: 12    End of Session   Activity Tolerance: Patient tolerated treatment well Patient left: in bed;with call bell/phone within reach;with family/visitor present Nurse Communication: Mobility status PT Visit Diagnosis: Unsteadiness on feet (R26.81);Other abnormalities of gait and mobility (R26.89);Muscle weakness (generalized) (M62.81)     Time: [id number] PT Time Calculation (min) (ACUTE ONLY): 12 min  Charges:  $Gait Training: 8-22 mins                     04/05/2019  Ginger Carne., PT Acute Rehabilitation Services [phone]  (pager) [phone]  (office)   Carmen Daniels 04/05/2019, 5:26 PM

## 2019-04-05 NOTE — Progress Notes (Signed)
Nutrition Follow-up  DOCUMENTATION CODES:   Not applicable  INTERVENTION:   Magic cup TID with meals, each supplement provides 290 kcal and 9 grams of protein  Encourage PO intake.   NUTRITION DIAGNOSIS:   Inadequate oral intake related to decreased appetite as evidenced by meal completion < 50%.  Ongoing.  GOAL:   Patient will meet greater than or equal to 90% of their needs  Progressing.  MONITOR:   PO intake, Supplement acceptance  REASON FOR ASSESSMENT:   Consult, Ventilator Enteral/tube feeding initiation and management  ASSESSMENT:   Pt with PMH of bipolar, OCD, depression, and cocaine abuse admitted 2/4 after OD.  Psych eval pending to help determine d/c plan. CIR following.   2/7 extubated 2/8 re-intubated for severe hypoxia 2/10 extubated, started on Regular diet   Diet Order:   Diet Order            Diet regular Room service appropriate? Yes; Fluid consistency: Thin  Diet effective now              EDUCATION NEEDS:   No education needs have been identified at this time  Skin:  Skin Assessment: (cellulitis R hand)  Last BM:  04/02/19  Height:   Ht Readings from Last 1 Encounters:  03/29/19 5\' 10"  (1.778 m)    Weight:   Wt Readings from Last 1 Encounters:  03/31/19 84.7 kg    BMI:  Body mass index is 26.79 kg/m.  Estimated Nutritional Needs:   Kcal:  2000-2200  Protein:  110-130 grams  Fluid:  2 L/day  Lockie Pares., RD, LDN, CNSC See AMiON for contact information

## 2019-04-06 DIAGNOSIS — T43292D Poisoning by other antidepressants, intentional self-harm, subsequent encounter: Secondary | ICD-10-CM

## 2019-04-06 LAB — BASIC METABOLIC PANEL
Anion gap: 11 (ref 5–15)
BUN: 17 mg/dL (ref 6–20)
CO2: 28 mmol/L (ref 22–32)
Calcium: 8.8 mg/dL — ABNORMAL LOW (ref 8.9–10.3)
Chloride: 100 mmol/L (ref 98–111)
Creatinine, Ser: 0.68 mg/dL (ref 0.44–1.00)
GFR calc Af Amer: >60 mL/min (ref >60)
GFR calc non Af Amer: >60 mL/min (ref >60)
Glucose, Bld: 117 mg/dL — ABNORMAL HIGH (ref 70–99)
Potassium: 3.5 mmol/L (ref 3.5–5.1)
Sodium: 139 mmol/L (ref 135–145)

## 2019-04-06 LAB — CBC
HCT: 34.4 % — ABNORMAL LOW (ref 36.0–46.0)
Hemoglobin: 11.3 g/dL — ABNORMAL LOW (ref 12.0–15.0)
MCH: 29.4 pg (ref 26.0–34.0)
MCHC: 32.8 g/dL (ref 30.0–36.0)
MCV: 89.4 fL (ref 80.0–100.0)
Platelets: 201 10*3/uL (ref 150–400)
RBC: 3.85 MIL/uL — ABNORMAL LOW (ref 3.87–5.11)
RDW: 12.7 % (ref 11.5–15.5)
WBC: 8.4 10*3/uL (ref 4.0–10.5)
nRBC: 0 % (ref 0.0–0.2)

## 2019-04-06 LAB — MAGNESIUM: Magnesium: 1.9 mg/dL (ref 1.7–2.4)

## 2019-04-06 LAB — PHOSPHORUS: Phosphorus: 3 mg/dL (ref 2.5–4.6)

## 2019-04-06 MED ORDER — LEVETIRACETAM 750 MG PO TABS
750.0000 mg | ORAL_TABLET | Freq: Two times a day (BID) | ORAL | Status: DC
  Administered 2019-04-06 – 2019-04-10 (×8): 750 mg via ORAL
  Filled 2019-04-06 (×8): qty 1

## 2019-04-06 MED ORDER — LEVETIRACETAM 500 MG PO TABS: 500.0000 mg | ORAL_TABLET | Freq: Two times a day (BID) | ORAL | Status: DC

## 2019-04-06 MED ORDER — GUAIFENESIN-DM 100-10 MG/5ML PO SYRP
5.0000 mL | ORAL_SOLUTION | ORAL | Status: DC | PRN
  Administered 2019-04-06 – 2019-04-08 (×4): 5 mL via ORAL
  Filled 2019-04-06 (×4): qty 10

## 2019-04-06 MED ORDER — LEVETIRACETAM 500 MG PO TABS: 500.0000 mg | ORAL_TABLET | Freq: Every day | ORAL | Status: DC

## 2019-04-06 NOTE — Progress Notes (Signed)
Physical Therapy Treatment Patient Details Name: Carmen Daniels MRN: [id number] DOB: April 03, 1985 Today's Date: 04/06/2019    History of Present Illness Pt is a 34 yo female with  PMHx of Bipolar and OCD found with altered mental status from presumed intentional overdose.    PT Comments    Much improved today.  Flat, but interactive and verbal.  Gait not steady, but pt able to react to deviation to self correct most of the LOB.     Follow Up Recommendations  Other (comment);CIR(may be able to make it straight to a behavioral health ctr)     Equipment Recommendations  Other (comment)(TBA)    Recommendations for Other Services       Precautions / Restrictions Precautions Precautions: Fall Restrictions Weight Bearing Restrictions: No    Mobility  Bed Mobility Overal bed mobility: Needs Assistance Bed Mobility: Supine to Sit;Sit to Supine     Supine to sit: Supervision Sit to supine: Supervision   General bed mobility comments: S for safety, no physical assist  Transfers Overall transfer level: Needs assistance Equipment used: 1 person hand held assist Transfers: Sit to/from Stand Sit to Stand: Supervision         General transfer comment: overall supervision, occasional periods of min guard/min A to maintain balance  Ambulation/Gait Ambulation/Gait assistance: Min assist;Min guard Gait Distance (Feet): 150 Feet(then additional 120) Assistive device: None Gait Pattern/deviations: Step-through pattern Gait velocity: slower to moderate Gait velocity interpretation: 1.31 - 2.62 ft/sec, indicative of limited community ambulator General Gait Details: pt still with mildly uncoordinated, scossored, wandering gait, but significantly improved to a point where she can ambulate without physical assist for periods of time, needing min for deviation due to loss of attention or an intentional scan of her environment or an abrupt directional change.   Stairs              Wheelchair Mobility    Modified Rankin (Stroke Patients Only)       Balance Overall balance assessment: Needs assistance Sitting-balance support: Feet supported Sitting balance-Leahy Scale: Good     Standing balance support: Single extremity supported;No upper extremity supported Standing balance-Leahy Scale: Fair Standing balance comment: some dynamic LOB with increased distances                            Cognition Arousal/Alertness: Awake/alert Behavior During Therapy: Flat affect Overall Cognitive Status: Impaired/Different from baseline Area of Impairment: Safety/judgement                         Safety/Judgement: Decreased awareness of deficits     General Comments: some decreased awareness of deficits, but suspect majority of presentation to be result of mental health diagnoses      Exercises      General Comments        Pertinent Vitals/Pain Pain Assessment: No/denies pain    Home Living                      Prior Function            PT Goals (current goals can now be found in the care plan section) Acute Rehab PT Goals Patient Stated Goal: to go home PT Goal Formulation: With patient Time For Goal Achievement: 04/18/19 Potential to Achieve Goals: Good Progress towards PT goals: Progressing toward goals    Frequency    Min 3X/week  PT Plan Current plan remains appropriate    Co-evaluation PT/OT/SLP Co-Evaluation/Treatment: Yes Reason for Co-Treatment: To address functional/ADL transfers PT goals addressed during session: Mobility/safety with mobility        AM-PAC PT "6 Clicks" Mobility   Outcome Measure  Help needed turning from your back to your side while in a flat bed without using bedrails?: None Help needed moving from lying on your back to sitting on the side of a flat bed without using bedrails?: None Help needed moving to and from a bed to a chair (including a wheelchair)?: A  Little Help needed standing up from a chair using your arms (e.g., wheelchair or bedside chair)?: None Help needed to walk in hospital room?: A Little Help needed climbing 3-5 steps with a railing? : A Little 6 Click Score: 21    End of Session   Activity Tolerance: Patient tolerated treatment well Patient left: in bed;with call bell/phone within reach;with family/visitor present Nurse Communication: Mobility status PT Visit Diagnosis: Unsteadiness on feet (R26.81);Other abnormalities of gait and mobility (R26.89);Muscle weakness (generalized) (M62.81)     Time: [id number] PT Time Calculation (min) (ACUTE ONLY): 23 min  Charges:  $Gait Training: 8-22 mins                     04/06/2019  Ginger Carne., PT Acute Rehabilitation Services [phone]  (pager) [phone]  (office)   Tessie Fass Marquitta Persichetti 04/06/2019, 5:15 PM

## 2019-04-06 NOTE — Progress Notes (Signed)
PROGRESS NOTE   Carmen Daniels  [id number]    DOB: 07-Jan-1986    DOA: 03/28/2019  PCP: [name], MD   I have briefly reviewed patients previous medical records in [facility].  Chief Complaint:   Chief Complaint  Patient presents with  . Drug Overdose    Brief Narrative:  PCCM > TRH transfer 34/60: 34 year old female, lives with her "family" and independent, PMH of bipolar disorder and OCD was found with altered mental status from presumed intentional polysubstance overdose.  Empty bottles of lithium, Ambien, Flexeril and Wellbutrin were found.  She developed status epilepticus with prolonged QTC hence culprit was presumed to be her Wellbutrin.  She required intubation for airway protection and treatment of status epilepticus.  Extubated 2/7 but required reintubation 2/8 for increased work of breathing.  Extubated 2/10, stabilized and transferred to [facility] 2/12.  Neurology signed off 2/11.  Psychiatry consulted and recommend inpatient [facility] when medically cleared.  Patient is medically stable for discharge to [facility] pending acceptance and bed availability.  TOC team involved.   Assessment & Plan:  Active Problems:   Intentional benzodiazepine overdose (HCC)   Bupropion overdose   Acute respiratory failure with hypoxia (HCC)   Acute respiratory failure with hypoxia: Secondary to aspiration pneumonia and acute metabolic encephalopathy.  S/p intubation and extubation x2, last extubated 2/11.  Resolved. Saturating in the 90s on room air this morning.  Encouraged aggressive incentive spirometry.  Mobilize.  Aspiration pneumonia: Secondary to mental status changes.  Complete course of levofloxacin and Flagyl through 2/12.  Recommend follow-up chest x-ray in 4 weeks to ensure resolution of pneumonia findings.  Acute toxic metabolic encephalopathy: Secondary to polysubstance overdose.  Appears to have almost resolved.  Coherent this morning.  Status epilepticus: Neurology signed off 2/11  appreciated.  Status felt to be provoked in the setting of Wellbutrin overdose.  Neurology indicated that she most likely does not require long-term AEDs.  MRI did not show any acute abnormality.  Arachnoid cyst is chronic and most likely not contributing to seizures at this point.  Please see Dr. Hortense Ramal note from 2/11 regarding Keppra dose tapering.  Patient has been counseled that she cannot drive for 6 months or until cleared by physician after discharge and she verbalizes understanding.  As needed IV Ativan for seizures in the hospital.  Outpatient follow-up with neurology.  Suicide attempt/bipolar disorder/depression/OCD: Psychiatry has evaluated and recommend inpatient [facility] admission when medically cleared.  Patient is medically cleared for discharge when bed available at [facility].  As per discussion with clinical social work, no bed available today.  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  Patient willing to voluntarily go to [facility] and hence no IVC required.  Patient reports that she follows up with Dr. Toy Care, Psychiatry as outpatient.  Right hand cellulitis: Appears to have resolved.  Complete course of levofloxacin and Flagyl.  Hypokalemia: Resolved.  Anemia: Stable.    Body mass index is 24.42 kg/m.  Nutritional Status Nutrition Problem: Inadequate oral intake Etiology: decreased appetite Signs/Symptoms: meal completion < 50% Interventions: Magic cup  DVT prophylaxis: Lovenox Code Status: Full Family Communication: None at bedside. Disposition:  . Patient came from: Home           . Anticipated d/c place: [facility] when bed available . Barriers to d/c: Patient is medically cleared for discharge to [facility] when a bed is available.   Consultants:   PCCM/neurology signed off. Psychiatry  Procedures:   Extubated 2/10.  Antimicrobials:  Levofloxacin 2/8-2/12 Flagyl 2/8-2/12 Clindamycin 2/4-2/8 Fluconazole x1 dose on 2/4.   Subjective:  Patient interviewed and examined along  with her female nurse and female safety sitter at bedside.  Alert and oriented.  States that she has no recollection of overdose or suicide attempt.  Has intermittent dry cough and some throat irritation attributed to recent intubation.  Denies dyspnea or pain.  Objective:   Vitals:   04/06/19 0800 04/06/19 0819 04/06/19 1205 04/06/19 1223  BP:  107/72  124/83  Pulse: (!) 105  92   Resp: (!) 32  18   Temp:  98.7 F (37.1 C)  98.8 F (37.1 C)  TempSrc:  Axillary  Oral  SpO2: 93%  91%   Weight:      Height:        General exam: Pleasant young female, moderately built and nourished lying comfortably propped up in bed without distress.  Oral mucosa moist. Respiratory system: Occasional basal crackles but otherwise clear to auscultation without wheezing, rhonchi or crackles.  Saturating in the 90s on room air. Cardiovascular system: S1 & S2 heard, RRR. No JVD, murmurs, rubs, gallops or clicks. No pedal edema.  Telemetry personally reviewed: Sinus rhythm.  Occasional mild sinus tachycardia in the 100s.  QTc 0.44. Gastrointestinal system: Abdomen is nondistended, soft and nontender. No organomegaly or masses felt. Normal bowel sounds heard. Central nervous system: Alert and oriented to person, place, able to look at the wall calendar and tell the date. No focal neurological deficits. Extremities: Symmetric 5 x 5 power. Skin: No rashes, lesions or ulcers.  Multiple tattoos. Psychiatry: Judgement and insight, unable to assess. Mood & affect pleasant & appropriate.     Data Reviewed:   I have personally reviewed following labs and imaging studies   CBC: Recent Labs  Lab 04/04/19 0500 04/05/19 0404 04/06/19 0238  WBC 9.2 11.7* 8.4  HGB 9.7* 10.4* 11.3*  HCT 30.3* 32.1* 34.4*  MCV 94.1 92.5 89.4  PLT 154 173 [id number]    Basic Metabolic Panel: Recent Labs  Lab 04/04/19 0500 04/05/19 0404 04/06/19 0238  NA 141 137 139  K 3.1* 3.4* 3.5  CL 103 97* 100  CO2 28 27 28   GLUCOSE 99 72  117*  BUN 22* 20 17  CREATININE 0.63 0.63 0.68  CALCIUM 8.3* 8.8* 8.8*  MG 1.9 2.0 1.9  PHOS 2.8 4.1 3.0    Liver Function Tests: Recent Labs  Lab 04/02/19 0900 04/03/19 0433  AST  --  18  ALT  --  38  ALKPHOS  --  87  BILITOT  --  0.4  PROT  --  5.6*  ALBUMIN 2.5* 2.1*    CBG: Recent Labs  Lab 04/04/19 0750 04/04/19 1156 04/04/19 1519  GLUCAP 94 113* 101*    Microbiology Studies:   Recent Results (from the past 240 hour(s))  Respiratory Panel by RT PCR (Flu A&B, Covid) - Nasopharyngeal Swab     Status: None   Collection Time: 03/28/19 10:42 PM   Specimen: Nasopharyngeal Swab  Result Value Ref Range Status   SARS Coronavirus 2 by RT PCR NEGATIVE NEGATIVE Final    Comment: (NOTE) SARS-CoV-2 target nucleic acids are NOT DETECTED. The SARS-CoV-2 RNA is generally detectable in upper respiratoy specimens during the acute phase of infection. The lowest concentration of SARS-CoV-2 viral copies this assay can detect is 131 copies/mL. A negative result does not preclude SARS-Cov-2 infection and should not be used as the sole basis for treatment or other patient  management decisions. A negative result may occur with  improper specimen collection/handling, submission of specimen other than nasopharyngeal swab, presence of viral mutation(s) within the areas targeted by this assay, and inadequate number of viral copies (<131 copies/mL). A negative result must be combined with clinical observations, patient history, and epidemiological information. The expected result is Negative. Fact Sheet for Patients:  PinkCheek.be Fact Sheet for Healthcare Providers:  GravelBags.it This test is not yet ap proved or cleared by the [place] FDA and  has been authorized for detection and/or diagnosis of SARS-CoV-2 by FDA under an Emergency Use Authorization (EUA). This EUA will remain  in effect (meaning this test can be  used) for the duration of the COVID-19 declaration under Section 564(b)(1) of the Act, 21 U.S.C. section 360bbb-3(b)(1), unless the authorization is terminated or revoked sooner.    Influenza A by PCR NEGATIVE NEGATIVE Final   Influenza B by PCR NEGATIVE NEGATIVE Final    Comment: (NOTE) The Xpert Xpress SARS-CoV-2/FLU/RSV assay is intended as an aid in  the diagnosis of influenza from Nasopharyngeal swab specimens and  should not be used as a sole basis for treatment. Nasal washings and  aspirates are unacceptable for Xpert Xpress SARS-CoV-2/FLU/RSV  testing. Fact Sheet for Patients: PinkCheek.be Fact Sheet for Healthcare Providers: GravelBags.it This test is not yet approved or cleared by the [place] FDA and  has been authorized for detection and/or diagnosis of SARS-CoV-2 by  FDA under an Emergency Use Authorization (EUA). This EUA will remain  in effect (meaning this test can be used) for the duration of the  Covid-19 declaration under Section 564(b)(1) of the Act, 21  U.S.C. section 360bbb-3(b)(1), unless the authorization is  terminated or revoked. Performed at [facility], [place] [address], [place], [place] [address]   MRSA PCR Screening     Status: None   Collection Time: 03/29/19 10:36 AM   Specimen: Nasal Mucosa; Nasopharyngeal  Result Value Ref Range Status   MRSA by PCR NEGATIVE NEGATIVE Final    Comment:        The GeneXpert MRSA Assay (FDA approved for NASAL specimens only), is one component of a comprehensive MRSA colonization surveillance program. It is not intended to diagnose MRSA infection nor to guide or monitor treatment for MRSA infections. Performed at [facility], [place] [address] [place], [place] [address]   Culture, blood (single)     Status: None   Collection Time: 03/29/19 12:06 PM   Specimen: BLOOD  Result Value Ref Range Status   Specimen  Description   Final    BLOOD RIGHT ARM Performed at [place] [address], [place], [place] [address]    Special Requests   Final    BOTTLES DRAWN AEROBIC AND ANAEROBIC Blood Culture adequate volume Performed at [place] [address], [place], [place] [address]    Culture   Final    NO GROWTH 5 DAYS Performed at [facility], [place] [address] [place], [place] [address]    Report Status 04/03/2019 FINAL  Final     Radiology Studies:  No results found.   Scheduled Meds:   . Chlorhexidine Gluconate Cloth  6 each Topical Daily  . clonazepam  1 mg Oral TID  . enoxaparin (LOVENOX) injection  40 mg Subcutaneous Daily  . sodium chloride flush  10-40 mL Intracatheter Q12H    Continuous Infusions:   . sodium chloride 10 mL/hr at 04/05/19 1300  .  levETIRAcetam 1,000 mg (04/06/19 0947)     LOS: 8 days     Vernell Leep, MD, [place], [facility]. [facility]    To contact the attending provider between 7A-7P or the covering provider during after hours 7P-7A, please log into the web site [email or web address] and access using universal [place] password for that web site. If you do not have the password, please call the hospital operator.  04/06/2019, 1:23 PM

## 2019-04-06 NOTE — Progress Notes (Signed)
1730 Received pt from 4N via bed. Pt is sleepy. 1:1 suicide sitter present. Pt has no complaints. 1900 Pt is more awake now, had dinner, pleasant and cooperative.

## 2019-04-06 NOTE — Plan of Care (Signed)

## 2019-04-06 NOTE — TOC Progression Note (Signed)
Transition of Care [facility]) - Progression Note    Patient Details  Name: Carmen Daniels MRN: [id number] Date of Birth: 06-11-1985  Transition of Care [facility]) CM/SW [place], [place] Phone Number: 04/06/2019, 3:55 PM  Clinical Narrative:     CSW made referral to [facility] CSW was informed they will review her information but they have no availability at this time.    CSW will continue to follow and assist with discharge planning.   Thurmond Butts, MSW, [place] Clinical Social Worker   Expected Discharge Plan: Psychiatric Hospital Barriers to Discharge: Continued Medical Work up  Expected Discharge Plan and Services Expected Discharge Plan: [facility] In-house Referral: Clinical Social Work                                             Social Determinants of Health (SDOH) Interventions    Readmission Risk Interventions No flowsheet data found.

## 2019-04-06 NOTE — Progress Notes (Signed)
Occupational Therapy Treatment Patient Details Name: Carmen Daniels MRN: [id number] DOB: 15-Mar-1985 Today's Date: 04/06/2019    History of present illness Pt is a 34 yo female with  PMHx of Bipolar and OCD found with altered mental status from presumed intentional overdose.   OT comments  Pt making excellent progress toward OT goals this date. Session focused on OOB BADL and cognition. Pt completed bed mobility at supervision level of assist and transfers at supervision level. She required min guard to min A for standing balance with functional mobility. Pt able to complete functional mobility beyond household distance without AD. She accurately completed trail making task. Suspect majority of her presentation is attributed to her psychiatric diagnoses and result of body coming off of overdose. Recommend pt d/c to psych facility with continued acute OT services there. Will continue to follow.   Follow Up Recommendations  Other (comment)([facility] d/c with continued OT acutely there if necessary)    Equipment Recommendations  None recommended by OT    Recommendations for Other Services      Precautions / Restrictions Precautions Precautions: Fall;Other (comment) Restrictions Weight Bearing Restrictions: No       Mobility Bed Mobility Overal bed mobility: Needs Assistance Bed Mobility: Supine to Sit       Sit to supine: Supervision   General bed mobility comments: S for safety, no physical assist  Transfers Overall transfer level: Needs assistance Equipment used: 1 person hand held assist Transfers: Sit to/from Stand Sit to Stand: Supervision         General transfer comment: overall supervision, occasional periods of min guard/min A to maintain balance    Balance Overall balance assessment: Needs assistance Sitting-balance support: Feet supported Sitting balance-Leahy Scale: Good     Standing balance support: Single extremity supported;During functional  activity Standing balance-Leahy Scale: Fair Standing balance comment: some dynamic LOB with increased distances                           ADL either performed or assessed with clinical judgement   ADL Overall ADL's : Needs assistance/impaired     Grooming: Min guard;Standing   Upper Body Bathing: Minimal assistance;Sitting;Standing   Lower Body Bathing: Minimal assistance;Sit to/from stand   Upper Body Dressing : Minimal assistance;Sitting;Standing   Lower Body Dressing: Minimal assistance;Sit to/from stand;Sitting/lateral leans   Toilet Transfer: Min guard           Functional mobility during ADLs: Min guard;Minimal assistance;Cueing for safety General ADL Comments: pt with improvement from eval date, able to complete functional mobility beyond household distance     Vision       Perception     Praxis      Cognition Arousal/Alertness: Awake/alert Behavior During Therapy: Flat affect Overall Cognitive Status: Impaired/Different from baseline Area of Impairment: Safety/judgement                         Safety/Judgement: Decreased awareness of deficits     General Comments: some decreased awareness of deficits, but suspect majority of presentation to be result of mental health diagnoses        Exercises     Shoulder Instructions       General Comments      Pertinent Vitals/ Pain       Pain Assessment: No/denies pain  Home Living  Prior Functioning/Environment              Frequency  Min 2X/week        Progress Toward Goals  OT Goals(current goals can now be found in the care plan section)  Progress towards OT goals: Progressing toward goals  Acute Rehab OT Goals Patient Stated Goal: to go home OT Goal Formulation: With patient Time For Goal Achievement: 04/18/19 Potential to Achieve Goals: Good  Plan Discharge plan needs to be updated     Co-evaluation                 AM-PAC OT "6 Clicks" Daily Activity     Outcome Measure   Help from another person eating meals?: None Help from another person taking care of personal grooming?: None Help from another person toileting, which includes using toliet, bedpan, or urinal?: A Little Help from another person bathing (including washing, rinsing, drying)?: A Little Help from another person to put on and taking off regular upper body clothing?: None Help from another person to put on and taking off regular lower body clothing?: None 6 Click Score: 22    End of Session    OT Visit Diagnosis: Unsteadiness on feet (R26.81);Muscle weakness (generalized) (M62.81)   Activity Tolerance Patient tolerated treatment well   Patient Left in bed;with call bell/phone within reach;with bed alarm set;with family/visitor present   Nurse Communication Mobility status        Time: [id number] OT Time Calculation (min): 21 min  Charges: OT General Charges $OT Visit: 1 Visit OT Treatments $Self Care/Home Management : 8-22 mins  Zenovia Jarred, MSOT, OTR/L Acute Rehabilitation Services [facility] Office Number: [phone]   Zenovia Jarred 04/06/2019, 5:01 PM

## 2019-04-06 NOTE — Progress Notes (Signed)
Pharmacy Antibiotic Note  Carmen Daniels is a 34 y.o. female admitted on 03/28/2019 with pneumonia.  Pharmacy has been consulted for Levaquin dosing. Also started on Flagyl per MD. Noted "anaphylaxis" allergy documented with Keflex and no history of beta lactam usage noted in chart. Flagyl allergy is "diarrhea." Patient previously on Clinda this admit. SCr 0.74 stable. QTC 410.  WBC wnl.   Plan: Levaquin 750mg  IV q24h ending today   Height: 5\' 10"  (177.8 cm)(measured pt x3) Weight: 170 lb 3.1 oz (77.2 kg) IBW/kg (Calculated) : 68.5  Temp (24hrs), Avg:98.9 F (37.2 C), Min:98.7 F (37.1 C), Max:99 F (37.2 C)  Recent Labs  Lab 03/31/19 0444 03/31/19 1027 04/02/19 0900 04/03/19 0433 04/04/19 0500 04/05/19 0404 04/06/19 0238  WBC   < >  --  18.8* 13.6* 9.2 11.7* 8.4  CREATININE   < >  --  0.74 0.58 0.63 0.63 0.68  LATICACIDVEN  --  1.4  --   --   --   --   --    < > = values in this interval not displayed.    Estimated Creatinine Clearance: 107.2 mL/min (by C-G formula based on SCr of 0.68 mg/dL).    Allergies  Allergen Reactions  . Keflex [Cephalexin] Anaphylaxis  . Flagyl [Metronidazole] Diarrhea   Antibiotics: 2/8 Levoflox >>2/12 2/8 Flagyl >> 2/11 2/4 clinda>>2/8 2/4 fluconazole 200 IV x 1 dose for vaginal infxn  Microbiology:  2/3 Flu A/B neg, Covid neg 2/4 MRSA PCR neg 2/4 BCx2: ngtd  Benetta Spar, PharmD, BCPS, BCCP Clinical Pharmacist  Please check AMION for all [place] phone numbers After 10:00 PM, call [place] [phone]

## 2019-04-06 NOTE — TOC Initial Note (Signed)
Transition of Care [facility]) - Initial/Assessment Note    Patient Details  Name: Carmen Daniels MRN: [id number] Date of Birth: 1986-01-12  [facility] CM/SW Contact:    Vinie Sill, [place] Phone Number: 04/06/2019, 12:44 PM  Clinical Narrative:                  CSW visit with the patient and her mother at bedside. CSW introduced self and explained role. CSW discussed psychiatry's evaluation of inpatient [facility] once medically stable. Patient states she understands and is willing to go on her own. Patient signed voluntary admissions and consent for treatment form (copy placed in chart).  CSW will continue to follow and assist with discharge planning.  Thurmond Butts, MSW, [place] Clinical Social Worker   Expected Discharge Plan: Psychiatric Hospital Barriers to Discharge: Continued Medical Work up   Patient Goals and CMS Choice        Expected Discharge Plan and Services Expected Discharge Plan: [facility] In-house Referral: Clinical Social Work                                            Prior Living Arrangements/Services              Need for Family Participation in Patient Care: Yes (Comment) Care giver support system in place?: Yes (comment)      Activities of Daily Living Home Assistive Devices/Equipment: None ADL Screening (condition at time of admission) Patient's cognitive ability adequate to safely complete daily activities?: No(patient very lethargic and non verbal) Is the patient deaf or have difficulty hearing?: No Does the patient have difficulty seeing, even when wearing glasses/contacts?: No Does the patient have difficulty concentrating, remembering, or making decisions?: Yes(patient is lethargic and non verbal) Patient able to express need for assistance with ADLs?: No Does the patient have difficulty dressing or bathing?: Yes Independently performs ADLs?: No Communication: Needs assistance(patient lethargic and non  verbal) Is this a change from baseline?: Change from baseline, expected to last <3 days Dressing (OT): Needs assistance Is this a change from baseline?: Change from baseline, expected to last >3 days Grooming: Needs assistance Is this a change from baseline?: Change from baseline, expected to last >3 days Feeding: Needs assistance Is this a change from baseline?: Change from baseline, expected to last >3 days Bathing: Needs assistance Is this a change from baseline?: Change from baseline, expected to last >3 days Toileting: Needs assistance Is this a change from baseline?: Change from baseline, expected to last >3days In/Out Bed: Needs assistance Is this a change from baseline?: Change from baseline, expected to last >3 days Walks in Home: Needs assistance Is this a change from baseline?: Change from baseline, expected to last >3 days Does the patient have difficulty walking or climbing stairs?: Yes(secondary to lethargy) Weakness of Legs: None Weakness of Arms/Hands: None  Permission Sought/Granted Permission sought to share information with : Family Supports Permission granted to share information with : Yes, Verbal Permission Granted  Share Information with NAME: Arlie Solomons     Permission granted to share info w Relationship: mother  Permission granted to share info w Contact Information: [phone]  Emotional Assessment Appearance:: Appears stated age     Orientation: : Oriented to Self, Oriented to Place, Oriented to  Time, Oriented to Situation Alcohol / Substance Use: Not Applicable Psych Involvement: Yes (comment)  Admission diagnosis:  QT prolongation [R94.31] Intentional drug overdose, initial encounter ([place]) [T50.902A] Intentional benzodiazepine overdose, initial encounter [facility]) [T42.4X2A] Patient Active Problem List   Diagnosis Date Noted  . Bupropion overdose   . Acute respiratory failure with hypoxia ([place])   . Intentional benzodiazepine overdose ([place])  03/29/2019  . Menorrhagia 02/28/2019  . Intractable chronic 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 status migrainosus 02/08/2019  . History of elective abortion 12/28/2018  . Unwanted fertility 12/28/2018  . QT prolongation 07/03/2018  . AMS (altered mental status) 07/03/2018  . Serum total bilirubin elevated 07/03/2018  . Syncope 07/02/2018  . Sinus congestion 04/13/2017  . Borderline personality disorder ([place]) 04/11/2017  . Bipolar I disorder, current or most recent episode manic, with psychotic features ([place]) 04/10/2017  . Bipolar disorder ([place]) 04/10/2017  . Abnormal MRI of head 04/21/2016  . Chronic migraine without aura 03/27/2015  . Mild persistent asthma 12/30/2014  . Allergic rhinitis due to pollen 12/30/2014  . 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 Nasal polyposis 12/30/2014   PCP:  [name], MD Pharmacy:   [id number] - [place], [place] [address] Phone: [phone] Fax: [phone]     Social Determinants of Health (SDOH) Interventions    Readmission Risk Interventions No flowsheet data found.

## 2019-04-07 NOTE — Progress Notes (Signed)
Patient meets criteria for inpatient treatment per Ricky Ala, NP. No appropriate beds at [facility] currently. CSW faxed referrals to the following facilities for review:  [facility]  [facility]   [place] MSW, [place] Worker Disposition  [facility] Ph: [phone] Fax: [phone] 04/07/2019 12:54 PM

## 2019-04-07 NOTE — Progress Notes (Signed)
OT Cancellation Note  Patient Details Name: Carmen Daniels MRN: [id number] DOB: [birth year]   Cancelled Treatment:    Reason Eval/Treat Not Completed: Fatigue/lethargy limiting ability to participate. Pt sleeping heavily. Will try again next available date/time  Britt Bottom 04/07/2019, 1:20 PM

## 2019-04-07 NOTE — Progress Notes (Signed)
PROGRESS NOTE   Carmen Daniels  [id number]    DOB: 10-Dec-1985    DOA: 03/28/2019  PCP: [name], MD   I have briefly reviewed patients previous medical records in [place].  Chief Complaint:   Chief Complaint  Patient presents with  . Drug Overdose    Brief Narrative:  PCCM > TRH transfer 57/56: 34 year old female, lives with her "family" and independent, PMH of bipolar disorder and OCD was found with altered mental status from presumed intentional polysubstance overdose.  Empty bottles of lithium, Ambien, Flexeril and Wellbutrin were found.  She developed status epilepticus with prolonged QTC hence culprit was presumed to be her Wellbutrin.  She required intubation for airway protection and treatment of status epilepticus.  Extubated 2/7 but required reintubation 2/8 for increased work of breathing.  Extubated 2/10, stabilized and transferred to [facility] 2/12.  Neurology signed off 2/11.  Psychiatry consulted and recommend inpatient [facility] when medically cleared.  Patient remains medically stable for DC to [facility] pending bed availability.  TOC team to follow-up.   Assessment & Plan:  Active Problems:   Intentional benzodiazepine overdose (HCC)   Bupropion overdose   Acute respiratory failure with hypoxia (HCC)   Acute respiratory failure with hypoxia: Secondary to aspiration pneumonia and acute metabolic encephalopathy.  S/p intubation and extubation x2, last extubated 2/11.  Resolved.  Saturating in the mid 90s on room air.  Encouraged incentive spirometry use.  Aspiration pneumonia: Secondary to mental status changes.  Completed course of levofloxacin and Flagyl through 2/12.  Recommend follow-up chest x-ray in 4 weeks to ensure resolution of pneumonia findings.  Acute toxic metabolic encephalopathy: Secondary to polysubstance overdose.  Resolved.  Status epilepticus: Neurology sign off 2/11 appreciated.  Status epilepticus felt to be provoked in the setting of Wellbutrin overdose.   Neurology indicated that she most likely does not require long-term AEDs.  MRI did not show any acute abnormality.  Arachnoid cyst is chronic and most likely not contributing to seizures at this point.  Please see Dr. Hortense Ramal note from 2/11 regarding Keppra dose tapering.  Patient has been counseled that she cannot drive for 6 months or until cleared by physician after discharge and she verbalizes understanding.  As needed IV Ativan for seizures in the hospital.  Outpatient follow-up with Neurology.  No further seizures reported.  Suicide attempt/bipolar disorder/depression/OCD: Psychiatry has evaluated and recommend inpatient [facility] admission when medically cleared.  Patient is medically cleared for discharge when bed available at [facility].  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  Patient willing to voluntarily go to [facility] and hence no IVC required.  Patient reports that she follows up with Dr. Toy Care, Psychiatry as outpatient.  Awaiting follow-up from [facility] team regarding discharge.  Right hand cellulitis: Appears to have resolved.  Completed course of levofloxacin and Flagyl.  Hypokalemia: Resolved.  Anemia: Stable.    Body mass index is 24.42 kg/m.  Nutritional Status Nutrition Problem: Inadequate oral intake Etiology: decreased appetite Signs/Symptoms: meal completion < 50% Interventions: Magic cup  DVT prophylaxis: Lovenox Code Status: Full Family Communication: None at bedside. Disposition:  . Patient came from: Home           . Anticipated d/c place: [facility] when bed available . Barriers to d/c: Patient is medically cleared for discharge to [facility] when a bed is available.   Consultants:   PCCM/neurology signed off. Psychiatry  Procedures:   Extubated 2/10.  Antimicrobials:   Levofloxacin 2/8-2/12 Flagyl 2/8-2/12 Clindamycin 2/4-2/8 Fluconazole x1 dose  on 2/4.   Subjective:  Patient interviewed and examined along with female safety sitter at bedside.  Reports feeling well.   Cough has improved.  States that she has ambulated in the room to the bathroom without difficulty.  Confirmed with safety sitter and patient's RN that patient is ambulating steadily.  No other acute issues reported.  Patient denies suicidal ideations  Objective:   Vitals:   04/06/19 1737 04/06/19 1936 04/06/19 2008 04/07/19 0624  BP: 133/84 (!) 145/83  129/81  Pulse: 82 74  92  Resp: 18 18  16   Temp: (!) 100.6 F (38.1 C)  100.3 F (37.9 C) 98.9 F (37.2 C)  TempSrc: Oral  Oral Oral  SpO2:   99% 95%  Weight:      Height:        General exam: Pleasant young female, moderately built and nourished lying comfortably propped up in bed without distress.  Oral mucosa moist. Respiratory system: Clear to auscultation.  No increased work of breathing. Cardiovascular system: S1 and S2 heard, RRR.  No JVD, murmurs or pedal edema. Gastrointestinal system: Abdomen is nondistended, soft and nontender. No organomegaly or masses felt. Normal bowel sounds heard. Central nervous system: Alert and oriented x3. No focal neurological deficits. Extremities: Symmetric 5 x 5 power. Skin: No rashes, lesions or ulcers.  Multiple tattoos. Psychiatry: Judgement and insight appear to be intact mood & affect pleasant & appropriate.     Data Reviewed:   I have personally reviewed following labs and imaging studies   CBC: Recent Labs  Lab 04/04/19 0500 04/05/19 0404 04/06/19 0238  WBC 9.2 11.7* 8.4  HGB 9.7* 10.4* 11.3*  HCT 30.3* 32.1* 34.4*  MCV 94.1 92.5 89.4  PLT 154 173 [id number]    Basic Metabolic Panel: Recent Labs  Lab 04/04/19 0500 04/05/19 0404 04/06/19 0238  NA 141 137 139  K 3.1* 3.4* 3.5  CL 103 97* 100  CO2 28 27 28   GLUCOSE 99 72 117*  BUN 22* 20 17  CREATININE 0.63 0.63 0.68  CALCIUM 8.3* 8.8* 8.8*  MG 1.9 2.0 1.9  PHOS 2.8 4.1 3.0    Liver Function Tests: Recent Labs  Lab 04/02/19 0900 04/03/19 0433  AST  --  18  ALT  --  38  ALKPHOS  --  87  BILITOT  --  0.4  PROT   --  5.6*  ALBUMIN 2.5* 2.1*    CBG: Recent Labs  Lab 04/04/19 0750 04/04/19 1156 04/04/19 1519  GLUCAP 94 113* 101*    Microbiology Studies:   Recent Results (from the past 240 hour(s))  Respiratory Panel by RT PCR (Flu A&B, Covid) - Nasopharyngeal Swab     Status: None   Collection Time: 03/28/19 10:42 PM   Specimen: Nasopharyngeal Swab  Result Value Ref Range Status   SARS Coronavirus 2 by RT PCR NEGATIVE NEGATIVE Final    Comment: (NOTE) SARS-CoV-2 target nucleic acids are NOT DETECTED. The SARS-CoV-2 RNA is generally detectable in upper respiratoy specimens during the acute phase of infection. The lowest concentration of SARS-CoV-2 viral copies this assay can detect is 131 copies/mL. A negative result does not preclude SARS-Cov-2 infection and should not be used as the sole basis for treatment or other patient management decisions. A negative result may occur with  improper specimen collection/handling, submission of specimen other than nasopharyngeal swab, presence of viral mutation(s) within the areas targeted by this assay, and inadequate number of viral copies (<131 copies/mL). A negative result must  be combined with clinical observations, patient history, and epidemiological information. The expected result is Negative. Fact Sheet for Patients:  PinkCheek.be Fact Sheet for Healthcare Providers:  GravelBags.it This test is not yet ap proved or cleared by the [place] FDA and  has been authorized for detection and/or diagnosis of SARS-CoV-2 by FDA under an Emergency Use Authorization (EUA). This EUA will remain  in effect (meaning this test can be used) for the duration of the COVID-19 declaration under Section 564(b)(1) of the Act, 21 U.S.C. section 360bbb-3(b)(1), unless the authorization is terminated or revoked sooner.    Influenza A by PCR NEGATIVE NEGATIVE Final   Influenza B by PCR NEGATIVE  NEGATIVE Final    Comment: (NOTE) The Xpert Xpress SARS-CoV-2/FLU/RSV assay is intended as an aid in  the diagnosis of influenza from Nasopharyngeal swab specimens and  should not be used as a sole basis for treatment. Nasal washings and  aspirates are unacceptable for Xpert Xpress SARS-CoV-2/FLU/RSV  testing. Fact Sheet for Patients: PinkCheek.be Fact Sheet for Healthcare Providers: GravelBags.it This test is not yet approved or cleared by the [place] FDA and  has been authorized for detection and/or diagnosis of SARS-CoV-2 by  FDA under an Emergency Use Authorization (EUA). This EUA will remain  in effect (meaning this test can be used) for the duration of the  Covid-19 declaration under Section 564(b)(1) of the Act, 21  U.S.C. section 360bbb-3(b)(1), unless the authorization is  terminated or revoked. Performed at [facility], [place] [address], [place], [place] [address]   MRSA PCR Screening     Status: None   Collection Time: 03/29/19 10:36 AM   Specimen: Nasal Mucosa; Nasopharyngeal  Result Value Ref Range Status   MRSA by PCR NEGATIVE NEGATIVE Final    Comment:        The GeneXpert MRSA Assay (FDA approved for NASAL specimens only), is one component of a comprehensive MRSA colonization surveillance program. It is not intended to diagnose MRSA infection nor to guide or monitor treatment for MRSA infections. Performed at [facility], [place] [address], [place], [place] [address]   Culture, blood (single)     Status: None   Collection Time: 03/29/19 12:06 PM   Specimen: BLOOD  Result Value Ref Range Status   Specimen Description   Final    BLOOD RIGHT ARM Performed at [place] [address] [place], [place] [address]    Special Requests   Final    BOTTLES DRAWN AEROBIC AND ANAEROBIC Blood Culture adequate volume Performed at [place] [address], [place], [place] [address]    Culture   Final    NO GROWTH 5 DAYS Performed at [facility], [place] [address] [place], [place] [address]    Report Status 04/03/2019 FINAL  Final     Radiology Studies:  No results found.   Scheduled Meds:   . Chlorhexidine Gluconate Cloth  6 each Topical Daily  . clonazepam  1 mg Oral TID  . enoxaparin (LOVENOX) injection  40 mg Subcutaneous Daily  . levETIRAcetam  750 mg Oral BID   Followed by  . [START ON 04/13/2019] levETIRAcetam  500 mg Oral BID   Followed by  . [START ON 04/20/2019] levETIRAcetam  500 mg Oral Daily  . sodium chloride flush  10-40 mL Intracatheter Q12H    Continuous Infusions:   . sodium chloride 10 mL/hr at 04/05/19 1300     LOS:  9 days     Vernell Leep, MD, [place], [facility]. [facility]    To contact the attending provider between 7A-7P or the covering provider during after hours 7P-7A, please log into the web site [email or web address] and access using universal [place] password for that web site. If you do not have the password, please call the hospital operator.  04/07/2019, 11:58 AM

## 2019-04-08 MED ORDER — PHENOL 1.4 % MT LIQD
1.0000 | OROMUCOSAL | Status: DC | PRN
  Administered 2019-04-08: 1 via OROMUCOSAL
  Filled 2019-04-08: qty 177

## 2019-04-08 NOTE — Progress Notes (Signed)
PROGRESS NOTE   Carmen Daniels  [id number]    DOB: Apr 04, 1985    DOA: 03/28/2019  PCP: [name], MD   I have briefly reviewed patients previous medical records in [facility]  Chief Complaint:   Chief Complaint  Patient presents with  . Drug Overdose    Brief Narrative:  PCCM > TRH transfer 48/35: 34 year old female, lives with her "family" and independent, PMH of bipolar disorder and OCD was found with altered mental status from presumed intentional polysubstance overdose.  Empty bottles of lithium, Ambien, Flexeril and Wellbutrin were found.  She developed status epilepticus with prolonged QTC hence culprit was presumed to be her Wellbutrin.  She required intubation for airway protection and treatment of status epilepticus.  Extubated 2/7 but required reintubation 2/8 for increased work of breathing.  Extubated 2/10, stabilized and transferred to [facility] 2/12.  Neurology signed off 2/11.  Psychiatry consulted and recommend inpatient [facility] when medically cleared.  Patient remains medically stable for DC to [facility] pending bed availability.  TOC team to follow-up.   Assessment & Plan:  Active Problems:   Intentional benzodiazepine overdose (HCC)   Bupropion overdose   Acute respiratory failure with hypoxia (HCC)   Acute respiratory failure with hypoxia: Secondary to aspiration pneumonia and acute metabolic encephalopathy.  S/p intubation and extubation x2, last extubated 2/11.  Resolved.  Saturating in the mid 90s on room air.  Encouraged incentive spirometry use.  Aspiration pneumonia: Secondary to mental status changes.  Completed course of levofloxacin and Flagyl through 2/12.  Recommend follow-up chest x-ray in 4 weeks to ensure resolution of pneumonia findings.  Acute toxic metabolic encephalopathy: Secondary to polysubstance overdose.  Resolved.  Status epilepticus: Neurology sign off 2/11 appreciated.  Status epilepticus felt to be provoked in the setting of Wellbutrin overdose.   Neurology indicated that she most likely does not require long-term AEDs.  MRI did not show any acute abnormality.  Arachnoid cyst is chronic and most likely not contributing to seizures at this point.  Please see Dr. Hortense Ramal note from 2/11 regarding Keppra dose tapering.  Patient has been counseled that she cannot drive for 6 months or until cleared by physician after discharge and she verbalizes understanding.  As needed IV Ativan for seizures in the hospital.  Outpatient follow-up with Neurology.  No further seizures reported.  Suicide attempt/bipolar disorder/depression/OCD: Psychiatry has evaluated and recommend inpatient [facility] admission when medically cleared.  Patient is medically cleared for discharge when bed available at [facility].  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  Patient willing to voluntarily go to [facility] and hence no IVC required.  Patient reports that she follows up with Dr. Toy Care, Psychiatry as outpatient.  Awaiting follow-up from [facility] team regarding discharge.  As per update, no beds available at [facility] or other behavioral health facilities.  Right hand cellulitis: Resolved.  Patient cannot tell where she had the cellulitis.  It may have been in the left forearm.  Completed course of levofloxacin and Flagyl.  Hypokalemia: Resolved.  Anemia: Stable.    Body mass index is 24.42 kg/m.  Nutritional Status Nutrition Problem: Inadequate oral intake Etiology: decreased appetite Signs/Symptoms: meal completion < 50% Interventions: Magic cup  DVT prophylaxis: Lovenox Code Status: Full Family Communication: None at bedside. Disposition:  . Patient came from: Home           . Anticipated d/c place: [facility] when bed available . Barriers to d/c: Patient is medically cleared for discharge to [facility] when a bed is  available.   Consultants:   PCCM/neurology signed off. Psychiatry  Procedures:   Extubated 2/10.  Antimicrobials:   Levofloxacin 2/8-2/12 Flagyl 2/8-2/12 Clindamycin  2/4-2/8 Fluconazole x1 dose on 2/4.   Subjective:  Patient interviewed and examined along with her female RN and female safety sitter at bedside.  Was sleeping, aroused.  Denies complaints.  Cough improved.  Unable to say where she had cellulitis.  Has some old self-inflicted wound scars on left ventral forearm which may have been the site of cellulitis that has now resolved.  No suicidal ideations reported.  Objective:   Vitals:   04/07/19 0624 04/07/19 1401 04/07/19 2012 04/08/19 0421  BP: 129/81 131/86 138/79 (!) 106/96  Pulse: 92 88 92 88  Resp: 16 18 17 18   Temp: 98.9 F (37.2 C) 99.6 F (37.6 C) 98.7 F (37.1 C) 99.2 F (37.3 C)  TempSrc: Oral Oral Oral Oral  SpO2: 95% 98% 100% 98%  Weight:      Height:        General exam: Pleasant young female, moderately built and nourished lying comfortably propped up in bed without distress.  Oral mucosa moist. Respiratory system: Clear to auscultation.  No increased work of breathing. Cardiovascular system: S1 and S2 heard, RRR.  No JVD, murmurs or pedal edema. Gastrointestinal system: Abdomen is nondistended, soft and nontender. No organomegaly or masses felt. Normal bowel sounds heard. Central nervous system: Alert and oriented x3. No focal neurological deficits. Extremities: Symmetric 5 x 5 power. Skin: Multiple tattoos.  Old self-inflicted wound scars on left ventral forearm.  No features of acute cellulitis Psychiatry: Judgement and insight appear to be intact mood & affect pleasant & appropriate.     Data Reviewed:   I have personally reviewed following labs and imaging studies   CBC: Recent Labs  Lab 04/04/19 0500 04/05/19 0404 04/06/19 0238  WBC 9.2 11.7* 8.4  HGB 9.7* 10.4* 11.3*  HCT 30.3* 32.1* 34.4*  MCV 94.1 92.5 89.4  PLT 154 173 [id number]    Basic Metabolic Panel: Recent Labs  Lab 04/04/19 0500 04/05/19 0404 04/06/19 0238  NA 141 137 139  K 3.1* 3.4* 3.5  CL 103 97* 100  CO2 28 27 28   GLUCOSE 99 72  117*  BUN 22* 20 17  CREATININE 0.63 0.63 0.68  CALCIUM 8.3* 8.8* 8.8*  MG 1.9 2.0 1.9  PHOS 2.8 4.1 3.0    Liver Function Tests: Recent Labs  Lab 04/02/19 0900 04/03/19 0433  AST  --  18  ALT  --  38  ALKPHOS  --  87  BILITOT  --  0.4  PROT  --  5.6*  ALBUMIN 2.5* 2.1*    CBG: Recent Labs  Lab 04/04/19 0750 04/04/19 1156 04/04/19 1519  GLUCAP 94 113* 101*    Microbiology Studies:   No results found for this or any previous visit (from the past 240 hour(s)).   Radiology Studies:  No results found.   Scheduled Meds:   . Chlorhexidine Gluconate Cloth  6 each Topical Daily  . clonazepam  1 mg Oral TID  . enoxaparin (LOVENOX) injection  40 mg Subcutaneous Daily  . levETIRAcetam  750 mg Oral BID   Followed by  . [START ON 04/13/2019] levETIRAcetam  500 mg Oral BID   Followed by  . [START ON 04/20/2019] levETIRAcetam  500 mg Oral Daily  . sodium chloride flush  10-40 mL Intracatheter Q12H    Continuous Infusions:   . sodium chloride 10 mL/hr at  04/05/19 1300     LOS: 10 days     Vernell Leep, MD, [place], [facility]. [facility]    To contact the attending provider between 7A-7P or the covering provider during after hours 7P-7A, please log into the web site [email or web address] and access using universal [place] password for that web site. If you do not have the password, please call the hospital operator.  04/08/2019, 1:03 PM

## 2019-04-08 NOTE — Progress Notes (Addendum)
CSW received an update form [facility] and they still do not have a bed for patient and follow up will be completed with the referrals that have been sent out.

## 2019-04-08 NOTE — Plan of Care (Signed)

## 2019-04-09 MED ORDER — LORAZEPAM 0.5 MG PO TABS
0.5000 mg | ORAL_TABLET | Freq: Three times a day (TID) | ORAL | Status: DC | PRN
  Administered 2019-04-09: 0.5 mg via ORAL
  Filled 2019-04-09: qty 1

## 2019-04-09 NOTE — Progress Notes (Signed)
PROGRESS NOTE   Carmen Daniels  [id number]    DOB: Jun 25, 1985    DOA: 03/28/2019  PCP: [name], MD   I have briefly reviewed patients previous medical records in [facility].  Chief Complaint:   Chief Complaint  Patient presents with  . Drug Overdose    Brief Narrative:  PCCM > TRH transfer 31/31: 34 year old female, lives with her "family" and independent, PMH of bipolar disorder and OCD was found with altered mental status from presumed intentional polysubstance overdose.  Empty bottles of lithium, Ambien, Flexeril and Wellbutrin were found.  She developed status epilepticus with prolonged QTC hence culprit was presumed to be her Wellbutrin.  She required intubation for airway protection and treatment of status epilepticus.  Extubated 2/7 but required reintubation 2/8 for increased work of breathing.  Extubated 2/10, stabilized and transferred to [facility] 2/12.  Neurology signed off 2/11.  Psychiatry consulted and recommend inpatient [facility] when medically cleared.  Patient remains medically stable for DC to [facility] pending bed availability.  Was advised late this evening that patient will be accepted to [facility] tomorrow.  DC summary to be completed tomorrow.   Assessment & Plan:  Active Problems:   Intentional benzodiazepine overdose (HCC)   Bupropion overdose   Acute respiratory failure with hypoxia (HCC)   Acute respiratory failure with hypoxia: Secondary to aspiration pneumonia and acute metabolic encephalopathy.  S/p intubation and extubation x2, last extubated 2/11.  Resolved.  Saturating in the mid 90s on room air.  Encouraged incentive spirometry use.  Aspiration pneumonia: Secondary to mental status changes.  Completed course of levofloxacin and Flagyl through 2/12.  Recommend follow-up chest x-ray in 4 weeks to ensure resolution of pneumonia findings.  Acute toxic metabolic encephalopathy: Secondary to polysubstance overdose.  Resolved.  Status epilepticus: Neurology sign off 2/11  appreciated.  Status epilepticus felt to be provoked in the setting of Wellbutrin overdose.  Neurology indicated that she most likely does not require long-term AEDs.  MRI did not show any acute abnormality.  Arachnoid cyst is chronic and most likely not contributing to seizures at this point.  Please see Dr. Hortense Ramal note from 2/11 regarding Keppra dose tapering.  Patient has been counseled that she cannot drive for 6 months or until cleared by physician after discharge and she verbalizes understanding.  As needed IV Ativan for seizures in the hospital.  Outpatient follow-up with Neurology.  No further seizures reported.  Suicide attempt/bipolar disorder/depression/OCD: Psychiatry has evaluated and recommend inpatient [facility] admission when medically cleared.  Patient is medically cleared for discharge when bed available at [facility]  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  Patient willing to voluntarily go to [facility] and hence no IVC required.  Patient reports that she follows up with Dr. Toy Care, Psychiatry as outpatient.  Awaiting follow-up from [facility] team regarding discharge.  As per update by [facility] team late this evening, patient will be accepted to [facility] tomorrow.  Patient reportedly got anxious after hearing the news.  Placed on as needed Ativan for anxiety.  IV line removed due to pain and no need to replace IV line.  Right hand cellulitis: Resolved.  Patient cannot tell where she had the cellulitis.  It may have been in the left forearm.  Completed course of levofloxacin and Flagyl.  Hypokalemia: Resolved.  Anemia: Stable.    Body mass index is 24.42 kg/m.  Nutritional Status Nutrition Problem: Inadequate oral intake Etiology: decreased appetite Signs/Symptoms: meal completion < 50% Interventions: Magic cup  DVT prophylaxis:  Lovenox Code Status: Full Family Communication: None at bedside. Disposition:  . Patient came from: Home           . Anticipated d/c place: [facility] possibly 2/16. . Barriers to  d/c: Patient is medically cleared for discharge to [facility] 2/16   Consultants:   PCCM/neurology signed off. Psychiatry  Procedures:   Extubated 2/10.  Antimicrobials:   Levofloxacin 2/8-2/12 Flagyl 2/8-2/12 Clindamycin 2/4-2/8 Fluconazole x1 dose on 2/4.   Subjective:  Patient seen this morning along with safety sitter at bedside.  Complained of pain at left cubital fossa IV site.  At that time denied any complaints including anxiety.  Was aware that we were waiting for a bed at [facility].  Objective:   Vitals:   04/08/19 1909 04/09/19 0640 04/09/19 0901 04/09/19 1515  BP: 127/62 (P) 113/76 (!) 98/33 127/83  Pulse: 93 (P) 83 98 77  Resp: 16 (P) 15 19 18   Temp: 98.8 F (37.1 C) (P) 98.7 F (37.1 C) 99 F (37.2 C) 99.5 F (37.5 C)  TempSrc: Oral (P) Oral Oral Oral  SpO2: 100% (P) 98% 100% 100%  Weight:      Height:        General exam: Pleasant young female, moderately built and nourished lying comfortably propped up in bed without distress.  Oral mucosa moist. Respiratory system: Clear to auscultation.  No increased work of breathing. Cardiovascular system: S1 and S2 heard, RRR.  No JVD, murmurs or pedal edema. Gastrointestinal system: Abdomen is nondistended, soft and nontender. No organomegaly or masses felt. Normal bowel sounds heard. Central nervous system: Alert and oriented x3. No focal neurological deficits. Extremities: Symmetric 5 x 5 power. Skin: Multiple tattoos.  Old self-inflicted wound scars on left ventral forearm.  No features of acute cellulitis.  Left cubital fossa PIV site without acute findings. Psychiatry: Judgement and insight appear to be intact mood & affect pleasant & appropriate and not anxious appearing.     Data Reviewed:   I have personally reviewed following labs and imaging studies   CBC: Recent Labs  Lab 04/04/19 0500 04/05/19 0404 04/06/19 0238  WBC 9.2 11.7* 8.4  HGB 9.7* 10.4* 11.3*  HCT 30.3* 32.1* 34.4*  MCV 94.1 92.5 89.4  PLT  154 173 [id number]    Basic Metabolic Panel: Recent Labs  Lab 04/04/19 0500 04/05/19 0404 04/06/19 0238  NA 141 137 139  K 3.1* 3.4* 3.5  CL 103 97* 100  CO2 28 27 28   GLUCOSE 99 72 117*  BUN 22* 20 17  CREATININE 0.63 0.63 0.68  CALCIUM 8.3* 8.8* 8.8*  MG 1.9 2.0 1.9  PHOS 2.8 4.1 3.0    Liver Function Tests: Recent Labs  Lab 04/03/19 0433  AST 18  ALT 38  ALKPHOS 87  BILITOT 0.4  PROT 5.6*  ALBUMIN 2.1*    CBG: Recent Labs  Lab 04/04/19 0750 04/04/19 1156 04/04/19 1519  GLUCAP 94 113* 101*    Microbiology Studies:   No results found for this or any previous visit (from the past 240 hour(s)).   Radiology Studies:  No results found.   Scheduled Meds:   . Chlorhexidine Gluconate Cloth  6 each Topical Daily  . clonazepam  1 mg Oral TID  . enoxaparin (LOVENOX) injection  40 mg Subcutaneous Daily  . levETIRAcetam  750 mg Oral BID   Followed by  . [START ON 04/13/2019] levETIRAcetam  500 mg Oral BID   Followed by  . [START ON 04/20/2019] levETIRAcetam  500  mg Oral Daily  . sodium chloride flush  10-40 mL Intracatheter Q12H    Continuous Infusions:   . sodium chloride 10 mL/hr at 04/05/19 1300     LOS: 11 days     Vernell Leep, MD, [place], [facility]. [facility]    To contact the attending provider between 7A-7P or the covering provider during after hours 7P-7A, please log into the web site [email or web address] and access using universal [place] password for that web site. If you do not have the password, please call the hospital operator.  04/09/2019, 6:58 PM

## 2019-04-09 NOTE — Progress Notes (Signed)
Physical Therapy Treatment Patient Details Name: Carmen Daniels MRN: [id number] DOB: 14-Nov-1985 Today's Date: 04/09/2019    History of Present Illness Pt is a 34 yo female with  PMHx of Bipolar and OCD found with altered mental status from presumed intentional overdose.    PT Comments    Continuing work on functional mobility and activity tolerance;  Significant improvements in gait distance and activity tolerance, as well as overall balance;  Goals met and updated  Follow Up Recommendations  Other (comment)(Noted plans for [facility])     Equipment Recommendations  None recommended by PT    Recommendations for Other Services       Precautions / Restrictions Precautions Precautions: Fall Precaution Comments: Fall risk is improving    Mobility  Bed Mobility Overal bed mobility: Independent       Supine to sit: Independent     General bed mobility comments: No difficulty  Transfers Overall transfer level: Modified independent   Transfers: Sit to/from Stand Sit to Stand: Modified independent (Device/Increase time)         General transfer comment: Overall no difficulty; noted tends to hold to foot of bed at initial stand  Ambulation/Gait Ambulation/Gait assistance: Min guard;Supervision Gait Distance (Feet): 300 Feet Assistive device: None Gait Pattern/deviations: Step-through pattern     General Gait Details: Mildly uncorrodinated, with variable step length; no overt loss of balance; no scissoring noted   Stairs             Wheelchair Mobility    Modified Rankin (Stroke Patients Only)       Balance     Sitting balance-Leahy Scale: Good       Standing balance-Leahy Scale: Fair(approaching Good)                              Cognition Arousal/Alertness: Awake/alert Behavior During Therapy: WFL for tasks assessed/performed Overall Cognitive Status: (for simple mobiltiy tasks)                                 General  Comments: some decreased awareness of deficits, but suspect majority of presentation to be result of mental health diagnoses      Exercises      General Comments        Pertinent Vitals/Pain Pain Assessment: Faces Faces Pain Scale: Hurts a little bit Pain Location: IV site at elbow Pain Descriptors / Indicators: Discomfort Pain Intervention(s): Monitored during session    Home Living                      Prior Function            PT Goals (current goals can now be found in the care plan section) Acute Rehab PT Goals Patient Stated Goal: to go home PT Goal Formulation: With patient Time For Goal Achievement: 04/18/19 Potential to Achieve Goals: Good Additional Goals Additional Goal #1: Pt will score greater than 20/24 on the DGI to demonstrate decr fall risk Progress towards PT goals: Goals met and updated - see care plan    Frequency    Min 3X/week      PT Plan Current plan remains appropriate    Co-evaluation              AM-PAC PT "6 Clicks" Mobility   Outcome Measure  Help needed turning from your back to  your side while in a flat bed without using bedrails?: None Help needed moving from lying on your back to sitting on the side of a flat bed without using bedrails?: None Help needed moving to and from a bed to a chair (including a wheelchair)?: None Help needed standing up from a chair using your arms (e.g., wheelchair or bedside chair)?: None Help needed to walk in hospital room?: None Help needed climbing 3-5 steps with a railing? : A Little 6 Click Score: 23    End of Session Equipment Utilized During Treatment: Gait belt Activity Tolerance: Patient tolerated treatment well Patient left: Other (comment)(Managing with Supervision in her room) Nurse Communication: Mobility status PT Visit Diagnosis: Unsteadiness on feet (R26.81);Other abnormalities of gait and mobility (R26.89);Muscle weakness (generalized) (M62.81)     Time:  0165-5374 PT Time Calculation (min) (ACUTE ONLY): 11 min  Charges:  $Gait Training: 8-22 mins                     Roney Marion, [place]  Acute Rehabilitation Services Pager [phone] Office [phone]    Carmen Daniels 04/09/2019, 12:03 PM

## 2019-04-09 NOTE — TOC Progression Note (Addendum)
Transition of Care [facility]) - Progression Note    Patient Details  Name: Carmen Daniels MRN: [id number] Date of Birth: [birth year]  Transition of Care [facility]) CM/SW Contact  Bartholomew Crews, RN Phone Number: [phone] 04/09/2019, 5:24 PM  Clinical Narrative:    Received call from [facility] in response to faxed referral. [facility] can accept patient after 8am tomorrow. No covid is needed.   The accepting MD is Dr. Jonelle Sports. The nurse may call report to [phone].  Patient to Horticulturist, commercial.   Spoke with patient at the bedside to advise of bed availability at [facility] tomorrow morning. Patient pleasant and agreeable to transition.   TOC following for transition needs.    Expected Discharge Plan: Psychiatric Hospital Barriers to Discharge: Continued Medical Work up  Expected Discharge Plan and Services Expected Discharge Plan: [facility] In-house Referral: Clinical Social Work                                             Social Determinants of Health (SDOH) Interventions    Readmission Risk Interventions No flowsheet data found.

## 2019-04-09 NOTE — Progress Notes (Signed)
Occupational Therapy Treatment Patient Details Name: Carmen Daniels MRN: [id number] DOB: 1985-09-10 Today's Date: 04/09/2019    History of present illness Pt is a 34 yo female with  PMHx of Bipolar and OCD found with altered mental status from presumed intentional overdose.   OT comments  Pt has met all OT goals at this time. Pt could benefit from continued outpatient OT at behavioral health. All goals met at this time.    Follow Up Recommendations  Other (comment)([facility] d/c with continued OT acutely there if necessary)    Equipment Recommendations  None recommended by OT    Recommendations for Other Services      Precautions / Restrictions Precautions Precautions: Fall Restrictions Weight Bearing Restrictions: No       Mobility Bed Mobility Overal bed mobility: Independent                Transfers Overall transfer level: Modified independent                    Balance   Sitting-balance support: Feet supported Sitting balance-Leahy Scale: Good     Standing balance support: No upper extremity supported;During functional activity Standing balance-Leahy Scale: Good                             ADL either performed or assessed with clinical judgement   ADL Overall ADL's : Needs assistance/impaired Eating/Feeding: Modified independent   Grooming: Standing;Wash/dry face;Modified independent   Upper Body Bathing: Standing;Modified independent   Lower Body Bathing: Sit to/from stand;Modified independent   Upper Body Dressing : Standing;Modified independent   Lower Body Dressing: Sit to/from stand;Sitting/lateral leans;Modified independent(able to don doff socks)   Toilet Transfer: Modified Independent           Functional mobility during ADLs: Modified independent General ADL Comments: pt demonstrates ability to close eyes and static standing reaching outside base of support, pt able to complete grooming task at sink reaching over a  chair so increasing BOS . Pt close to baseline     Vision Baseline Vision/History: No visual deficits Patient Visual Report: No change from baseline Vision Assessment?: No apparent visual deficits   Perception     Praxis      Cognition Arousal/Alertness: Awake/alert Behavior During Therapy: WFL for tasks assessed/performed Overall Cognitive Status: Within Functional Limits for tasks assessed(for simple mobiltiy tasks)                                 General Comments: pt watching TV and able to recap the program in detail         Exercises     Shoulder Instructions       General Comments      Pertinent Vitals/ Pain       Pain Assessment: No/denies pain  Home Living Family/patient expects to be discharged to:: Private residence Living Arrangements: Parent Available Help at Discharge: Family;Available 24 hours/day Type of Home: House Home Access: Stairs to enter [employer] of Steps: "a few"   Home Layout: One level     Bathroom Shower/Tub: Occupational psychologist: Handicapped height         Additional Comments: reports mom brings her snacks daily      Prior Functioning/Environment Level of Independence: Independent        Comments: not driving   Frequency  Min 2X/week  Progress Toward Goals  OT Goals(current goals can now be found in the care plan section)  Progress towards OT goals: Goals met/education completed, patient discharged from OT  Acute Rehab OT Goals Patient Stated Goal: to go home OT Goal Formulation: With patient Time For Goal Achievement: 04/18/19 Potential to Achieve Goals: Good ADL Goals Pt Will Perform Lower Body Dressing: with min guard assist;sit to/from stand Pt Will Transfer to Toilet: with min guard assist;ambulating;bedside commode Pt/caregiver will Perform Home Exercise Program: Increased strength;Both right and left upper extremity Additional ADL Goal #1: Pt will perform  multi-step commands in 3/5 trials with 100% accuracy.  Plan Discharge plan remains appropriate    Co-evaluation                 AM-PAC OT "6 Clicks" Daily Activity     Outcome Measure   Help from another person eating meals?: None Help from another person taking care of personal grooming?: None Help from another person toileting, which includes using toliet, bedpan, or urinal?: None Help from another person bathing (including washing, rinsing, drying)?: None Help from another person to put on and taking off regular upper body clothing?: None Help from another person to put on and taking off regular lower body clothing?: None 6 Click Score: 24    End of Session    OT Visit Diagnosis: Unsteadiness on feet (R26.81);Muscle weakness (generalized) (M62.81)   Activity Tolerance Patient tolerated treatment well   Patient Left in bed;with call bell/phone within reach;with bed alarm set;with nursing/sitter in room   Nurse Communication Mobility status        Time: 1429-1440 OT Time Calculation (min): 11 min  Charges: OT General Charges $OT Visit: 1 Visit OT Treatments $Self Care/Home Management : 8-22 mins   Brynn, OTR/L  Acute Rehabilitation Services Pager: [phone] Office: [phone] .    Jeri Modena 04/09/2019, 3:28 PM

## 2019-04-09 NOTE — BH Assessment (Signed)
Per Jonelle Sidle, patient accepted to [facility] for acceptance 04/10/19 (anytime after 8am). The accepting provider is Dr. Jonelle Sports. Nurse report [phone]. Patient's nurse Louie Casa) made aware of the updated disposition. Manya Silvas (case manager) [phone] also made aware of the updated disposition.

## 2019-04-09 NOTE — TOC Progression Note (Addendum)
Transition of Care [facility]) - Progression Note    Patient Details  Name: Carmen Daniels MRN: [id number] Date of Birth: Aug 15, 1985  Transition of Care [facility]) CM/SW Contact  Bartholomew Crews, RN Phone Number: [phone] 04/09/2019, 3:50 PM  Clinical Narrative:    NCM working on finding inpatient behavior health bed. Voicemail left for [facility]. Spoke with intake at [facility] - currently reviewing case and will advise of bed availability. Refaxed referral to [facility].   Update: Spoke with [facility]. Requesting new psych consult to reassess continued need for inpatient psych. MD paged.   TOC following for transition needs.   Expected Discharge Plan: Psychiatric Hospital Barriers to Discharge: Continued Medical Work up  Expected Discharge Plan and Services Expected Discharge Plan: [facility] In-house Referral: Clinical Social Work                                             Social Determinants of Health (SDOH) Interventions    Readmission Risk Interventions No flowsheet data found.

## 2019-04-10 DIAGNOSIS — T424X2D Poisoning by benzodiazepines, intentional self-harm, subsequent encounter: Secondary | ICD-10-CM

## 2019-04-10 MED ORDER — SACCHAROMYCES BOULARDII 250 MG PO CAPS
250.0000 mg | ORAL_CAPSULE | Freq: Two times a day (BID) | ORAL | Status: DC
  Administered 2019-04-10 (×2): 250 mg via ORAL
  Filled 2019-04-10 (×2): qty 1

## 2019-04-10 MED ORDER — SACCHAROMYCES BOULARDII 250 MG PO CAPS: 250.0000 mg | ORAL_CAPSULE | Freq: Two times a day (BID) | ORAL | 0 refills | Status: AC

## 2019-04-10 MED ORDER — LEVETIRACETAM 250 MG PO TABS: ORAL_TABLET | ORAL | 0 refills | Status: DC

## 2019-04-10 NOTE — Discharge Instructions (Signed)
I discussed the underwritten in detail with him.  He verbalized complete understanding.  Per [facility] statutes, patients with seizures are not allowed to drive until they have been seizure-free for six months.   Use caution when using heavy equipment or power tools. Avoid working on ladders or at heights. Take showers instead of baths. Ensure the water temperature is not too high on the home water heater. Do not go swimming alone. Do not lock yourself in a room alone (i.e. bathroom). When caring for infants or small children, sit down when holding, feeding, or changing them to minimize risk of injury to the child in the event you have a seizure. Maintain good sleep hygiene. Avoid alcohol.   If patienthas another seizure, call 911 and bring them back to the ED if: A. The seizure lasts longer than 5 minutes.  B. The patient doesn't wake shortly after the seizure or has new problems such as difficulty seeing, speaking or moving following the seizure C. The patient was injured during the seizure D. The patient has a temperature over 102 F (39C) E. The patient vomited during the seizure and now is having trouble breathing     Seizure, Adult A seizure is a sudden burst of abnormal electrical activity in the brain. Seizures usually last from 30 seconds to 2 minutes. The abnormal activity temporarily interrupts normal brain function. A seizure can cause many different symptoms depending on where in the brain it starts. What are the causes? Common causes of this condition include:  Fever or infection.  Brain abnormality, injury, bleeding, or tumor.  Low blood sugar.  Metabolic disorders or other conditions that are passed from parent to child (are inherited).  Reaction to a substance, such as a drug or a medicine, or suddenly stopping the use of a substance (withdrawal).  Stroke.  Developmental disorders such as autism or cerebral palsy. In some cases, the cause of  this condition may not be known. Some people who have a seizure never have another one. Seizures usually do not cause brain damage or permanent problems unless they are prolonged. A person who has repeated seizures over time without a clear cause has a condition called epilepsy. What increases the risk? You are more likely to develop this condition if you have:  A family history of epilepsy.  Had a tonic-clonic seizure in the past. This is a type of seizure that involves whole-body contraction of muscles and a loss of consciousness.  Autism, cerebral palsy, or other brain disorders.  A history of head trauma, lack of oxygen at birth, or strokes. What are the signs or symptoms? There are many different types of seizures. The symptoms of a seizure vary depending on the type of seizure you have. Examples of symptoms during a seizure include:  Uncontrollable shaking (convulsions).  Stiffening of the body.  Loss of consciousness.  Head nodding.  Staring.  Not responding to sound or touch.  Loss of bladder or bowel control. Some people have symptoms right before a seizure happens (aura) and right after a seizure happens (postictal). Symptoms before a seizure may include:  Fear or anxiety.  Nausea.  Feeling like the room is spinning (vertigo).  A feeling of having seen or heard something before (dj vu).  Odd tastes or smells.  Changes in vision, such as seeing flashing lights or spots. Symptoms after a seizure may include:  Confusion.  Sleepiness.  Headache.  Weakness on one side of the body. How is this diagnosed?  This condition may be diagnosed based on:  A description of your symptoms. Video of your seizures can be helpful.  Your medical history.  A physical exam. You may also have tests, including:  Blood tests.  CT scan.  MRI.  Electroencephalogram (EEG). This test measures electrical activity in the brain. An EEG can predict whether seizures will  return (recur).  A spinal tap (also called a lumbar puncture). This is the removal and testing of fluid that surrounds the brain and spinal cord. How is this treated? Most seizures will stop on their own in under 5 minutes, and no treatment is needed. Seizures that last longer than 5 minutes will usually need treatment. Treatment can include:  Medicines given through an IV.  Avoiding known triggers, such as medicines that you take for another condition.  Medicines to treat epilepsy (antiepileptics), if epilepsy caused your seizures.  Surgery to stop seizures, if you have epilepsy that does not respond to medicines. Follow these instructions at home: Medicines  Take over-the-counter and prescription medicines only as told by your health care provider.  Avoid any substances that may prevent your medicine from working properly, such as alcohol. Activity  Do not drive, swim, or do any other activities that would be dangerous if you had another seizure. Wait until your health care provider says it is safe to do them.  If you live in the [place] check with your local DMV (department of motor vehicles) to find out about local driving laws. Each state has specific rules about when you can legally return to driving.  Get enough rest. Lack of sleep can make seizures more likely to occur. Educating others Teach friends and family what to do if you have a seizure. They should:  Lay you on the ground to prevent a fall.  Cushion your head and body.  Loosen any tight clothing around your neck.  Turn you on your side. If vomiting occurs, this helps keep your airway clear.  Not hold you down. Holding you down will not stop the seizure.  Not put anything into your mouth.  Know whether or not you need emergency care. For example, they should get help right away if you have a seizure that lasts longer than 5 minutes or have several seizures in a row.  Stay with you until you recover.  General  instructions  Contact your health care provider each time you have a seizure.  Avoid anything that has ever triggered a seizure for you.  Keep a seizure diary. Record what you remember about each seizure, especially anything that might have triggered the seizure.  Keep all follow-up visits as told by your health care provider. This is important. Contact a health care provider if:  You have another seizure.  You have seizures more often.  Your seizure symptoms change.  You continue to have seizures with treatment.  You have symptoms of an infection or illness. This might increase your risk of having a seizure. Get help right away if:  You have a seizure that: ? Lasts longer than 5 minutes. ? Is different than previous seizures. ? Leaves you unable to speak or use a part of your body. ? Makes it harder to breathe.  You have: ? A seizure after a head injury. ? Multiple seizures in a row. ? Confusion or a severe headache right after a seizure.  You do not wake up immediately after a seizure.  You injure yourself during a seizure. These symptoms may represent  a serious problem that is an emergency. Do not wait to see if the symptoms will go away. Get medical help right away. Call your local emergency services (911 in the [place] Do not drive yourself to the hospital. Summary  Seizures are caused by abnormal electrical activity in the brain. The activity disrupts normal brain function and can cause various symptoms, such as convulsions, abnormal movements, or a change in consciousness.  There are many causes of seizures, including illnesses, medicines, genetic conditions, head injuries, strokes, tumors, substance abuse, or substance withdrawal.  Most seizures will stop on their own in under 5 minutes. Seizures that last longer than 5 minutes are a medical emergency and require immediate treatment.  Many medicines are used to treat seizures. Take over-the-counter and prescription  medicines only as told by your health care provider. This information is not intended to replace advice given to you by your health care provider. Make sure you discuss any questions you have with your health care provider. Document Revised: 04/28/2018 Document Reviewed: 04/28/2018 Elsevier Patient Education  [place].

## 2019-04-10 NOTE — Progress Notes (Signed)
Pt is A&O x4. Calm and cooperative. Discharged pt to [facility] via [facility]. Report was given to [employer] at [facility]

## 2019-04-10 NOTE — TOC Transition Note (Signed)
Transition of Care [facility]) - CM/SW Discharge Note   Patient Details  Name: Carmen Daniels MRN: [id number] Date of Birth: [birth year]  Transition [facility]) CM/SW Contact:  Bartholomew Crews, RN Phone Number: [phone] 04/10/2019, 9:12 AM   Clinical Narrative:    Patient to transition to [facility] today. Discharge order in. Notified MD of preparing transportation. RN made aware and advised to call report. [facility] notified. Mom contacted by phone to advise of transition. No further TOC needs identified.    [facility] Barriers to Discharge: No Barriers Identified   Patient Goals and CMS Choice        Discharge Placement                  Name of family member notified: Manuela Schwartz (mom) Patient and family notified of of transfer: 04/10/19  Discharge Plan and Services In-house Referral: Clinical Social Work                                   Social Determinants of Health (SDOH) Interventions     Readmission Risk Interventions No flowsheet data found.

## 2019-04-10 NOTE — Discharge Summary (Signed)
Physician Discharge Summary  [name] [id number] DOB: 1986-01-19 DOA: 03/28/2019  PCP: [name], MD  Admit date: 03/28/2019 Discharge date: 04/10/2019  Admitted From: Home Disposition: Behavioral health  Recommendations for Outpatient Follow-up:  1. Follow up with PCP in 1-2 weeks 2. Please obtain BMP/CBC in one week your next doctors visit.  3. Discontinue Wellbutrin.  Keppra prescribed with taper dose over next several days.  Refer to discharge medications for this. 4. No driving for at least 6 months. 5. Psychiatry medication she is currently [facility] per behavioral health team.  Discharge Condition: Stable CODE STATUS: Full Diet recommendation: Regular  Brief/Interim Summary: 34 year old with history of bipolar disorder/OCD presented with change in mental status concerns of having seizures secondary to polysubstance overdose including lithium, Ambien, Flexeril and Wellbutrin.  She was in status epilepticus with prolonged QTC.  Suspected culprit was Wellbutrin.  Initially intubated for airway protection in the setting of status epilepticus, eventually extubated 2/10.  Seen by neurology team, MRI brain was negative.  Neurology recommended taper course of Keppra.  Seen by behavioral health due to concerns for overdose/suicide attempt.  She was transitioned to behavioral health.  Hospital course was also complicated by aspiration pneumonia and possible right hand cellulitis.  Completed course of Levaquin and Flagyl. Medically stable to be discharged.  Rest of the details for her care is in the chart.   Discharge Diagnoses:  Active Problems:   Intentional benzodiazepine overdose (HCC)   Bupropion overdose   Acute respiratory failure with hypoxia (HCC)  Acute hypoxic respiratory failure requiring intubation secondary to metabolic encephalopathy and aspiration pneumonia -Patient was finally extubated on 2/11.  Hospital course was complicated by reintubation due to hypoxic  respiratory failure but doing well now.  Bronchodilators as needed.  Incentive spirometer and flutter valve. -Completed course of Levaquin and Flagyl.  Seizures/status epilepticus -Secondary to polypharmacy/drug overdose.  Likely culprit is Wellbutrin.  Currently has been stopped.  MRI brain is negative.  Seen by neurology recommending taper course of Keppra which has been prescribed.  Follow-up outpatient neurology in about 4 weeks.  In the meantime would recommend against driving for at least 6 months or until cleared by neurology team.  History of OCD/bipolar disorder Suicide attempt -Seen by psychiatry, transition to behavioral health.  Further medication adjustment per psychiatry team  Right upper extremity cellulitis -This is per history, appears to have resolved.  Completed course of Levaquin and Flagyl.  Consultations:  Neurology  Subjective: No acute events overnight.  Discharge Exam: Vitals:   04/09/19 1515 04/09/19 1945  BP: 127/83 116/70  Pulse: 77 83  Resp: 18 17  Temp: 99.5 F (37.5 C) 99.2 F (37.3 C)  SpO2: 100% 100%   Vitals:   04/09/19 0640 04/09/19 0901 04/09/19 1515 04/09/19 1945  BP: 113/76 (!) 98/33 127/83 116/70  Pulse: 83 98 77 83  Resp: 15 19 18 17   Temp: 98.7 F (37.1 C) 99 F (37.2 C) 99.5 F (37.5 C) 99.2 F (37.3 C)  TempSrc: Oral Oral Oral Oral  SpO2: 98% 100% 100% 100%  Weight:      Height:        General: Pt is alert, awake, not in acute distress Cardiovascular: RRR, S1/S2 +, no rubs, no gallops Respiratory: CTA bilaterally, no wheezing, no rhonchi Abdominal: Soft, NT, ND, bowel sounds + Extremities: no edema, no cyanosis  Discharge Instructions  Discharge Instructions    Diet - low sodium heart healthy   Complete by: As directed  Discharge instructions   Complete by: As directed    You were cared for by a hospitalist during your hospital stay. If you have any questions about your discharge medications or the care you  received while you were in the hospital after you are discharged, you can call the unit and asked to speak with the hospitalist on call if the hospitalist that took care of you is not available. Once you are discharged, your primary care physician will handle any further medical issues. Please note that NO REFILLS for any discharge medications will be authorized once you are discharged, as it is imperative that you return to your primary care physician (or establish a relationship with a primary care physician if you do not have one) for your aftercare needs so that they can reassess your need for medications and monitor your lab values.  Please request your Prim.MD to go over all Hospital Tests and Procedure/Radiological results at the follow up, please get all Hospital records sent to your Prim MD by signing hospital release before you go home.  Get CBC, CMP, 2 view Chest X ray checked  by Primary MD during your next visit or SNF MD in 5-7 days ( we routinely change or add medications that can affect your baseline labs and fluid status, therefore we recommend that you get the mentioned basic workup next visit with your PCP, your PCP may decide not to get them or add new tests based on their clinical decision)  On your next visit with your primary care physician please Get Medicines reviewed and adjusted.  If you experience worsening of your admission symptoms, develop shortness of breath, life threatening emergency, suicidal or homicidal thoughts you must seek medical attention immediately by calling 911 or calling your MD immediately  if symptoms less severe.  You Must read complete instructions/literature along with all the possible adverse reactions/side effects for all the Medicines you take and that have been prescribed to you. Take any new Medicines after you have completely understood and accpet all the possible adverse reactions/side effects.   Do not drive, operate heavy machinery, perform  activities at heights, swimming or participation in water activities or provide baby sitting services if your were admitted for syncope or siezures until you have seen by Primary MD or a Neurologist and advised to do so again.  Do not drive when taking Pain medications.   Driving Restrictions   Complete by: As directed    No driving for atleast 6 months otherwise cleared by Neurology.   Increase activity slowly   Complete by: As directed      Allergies as of 04/10/2019      Reactions   Keflex [cephalexin] Anaphylaxis   Flagyl [metronidazole] Diarrhea      Medication List    STOP taking these medications   buPROPion 300 MG 24 hr tablet Commonly known as: WELLBUTRIN XL     TAKE these medications   Ajovy 225 MG/1.5ML Soaj Generic drug: Fremanezumab-vfrm Inject 225 mg into the skin every 30 (thirty) days.   albuterol 108 (90 Base) MCG/ACT inhaler Commonly known as: Ventolin HFA Inhale 1-2 puffs into the lungs every 4 (four) hours as needed for wheezing or shortness of breath.   beclomethasone 40 MCG/ACT inhaler Commonly known as: QVAR Inhale 1 puff into the lungs 2 (two) times daily as needed (asthma).   Botox 100 units Solr injection Generic drug: botulinum toxin Type A MD TO INJECT 155 UNITS INTRAMUSCULARLY INTO HEAD AND NECK MUSCLES  EVERY 3 MONTHS BY PROVIDER What changed: See the new instructions.   clonazePAM 1 MG tablet Commonly known as: KLONOPIN Take 1 mg by mouth 4 (four) times daily.   cyclobenzaprine 10 MG tablet Commonly known as: FLEXERIL Take 10 mg by mouth 3 (three) times daily as needed for muscle spasms.   gabapentin 100 MG capsule Commonly known as: NEURONTIN Take 100 mg by mouth 3 (three) times daily.   HYDROcodone-acetaminophen 10-325 MG tablet Commonly known as: NORCO Take 1 tablet by mouth 2 (two) times daily as needed for moderate pain.   levETIRAcetam 250 MG tablet Commonly known as: Keppra Take 3 tablets (750 mg total) by mouth 2 (two)  times daily for 3 days, THEN 2 tablets (500 mg total) 2 (two) times daily for 7 days, THEN 2 tablets (500 mg total) daily for 7 days. Start taking on: April 10, 2019   lithium carbonate 450 MG CR tablet Commonly known as: ESKALITH Take 900 mg by mouth at bedtime.   lithium carbonate 300 MG capsule Take 600 mg by mouth daily.   promethazine 25 MG tablet Commonly known as: PHENERGAN Take 25 mg by mouth 3 (three) times daily as needed for nausea.   propranolol 10 MG tablet Commonly known as: INDERAL Take 10 mg by mouth 3 (three) times daily as needed (Anxiety).   saccharomyces boulardii 250 MG capsule Commonly known as: FLORASTOR Take 1 capsule (250 mg total) by mouth 2 (two) times daily for 7 days.   SUMAtriptan 100 MG tablet Commonly known as: IMITREX TAKE 1 TABLET AT ONSET MAY REPEAT IN 2 HOURS IF NEEDED MAY DOSE 2 PER DAY OR 3 What changed:   how much to take  how to take this  when to take this  reasons to take this  additional instructions   tranexamic acid 650 MG Tabs tablet Commonly known as: LYSTEDA Take 2 tablets (1,300 mg total) by mouth 3 (three) times daily. Take during menses for a maximum of five days   zolpidem 10 MG tablet Commonly known as: AMBIEN Take 10 mg by mouth at bedtime.      Follow-up Information    [name], MD. Schedule an appointment as soon as possible for a visit in 2 week(s).   Specialty: Internal Medicine Contact information: [address] [place] [address] [phone]          Allergies  Allergen Reactions  . Keflex [Cephalexin] Anaphylaxis  . Flagyl [Metronidazole] Diarrhea    You were cared for by a hospitalist during your hospital stay. If you have any questions about your discharge medications or the care you received while you were in the hospital after you are discharged, you can call the unit and asked to speak with the hospitalist on call if the hospitalist that took care of you is not available. Once  you are discharged, your primary care physician will handle any further medical issues. Please note that no refills for any discharge medications will be authorized once you are discharged, as it is imperative that you return to your primary care physician (or establish a relationship with a primary care physician if you do not have one) for your aftercare needs so that they can reassess your need for medications and monitor your lab values.   Procedures/Studies: DG Abd 1 View  Result Date: 03/29/2019 CLINICAL DATA:  Evaluate enteric tube placement. EXAM: ABDOMEN - 1 VIEW COMPARISON:  None FINDINGS: Interval placement of enteric tube. The tip and side  port of the tube are both well below the GE junction. Tip of the tube projects over the expected location of the gastric body. IMPRESSION: 1. Satisfactory position of enteric tube. Electronically Signed   By: Kerby Moors M.D.   On: 03/29/2019 19:20   CT HEAD WO CONTRAST  Result Date: 03/30/2019 CLINICAL DATA:  Seizure EXAM: CT HEAD WITHOUT CONTRAST TECHNIQUE: Contiguous axial images were obtained from the base of the skull through the vertex without intravenous contrast. COMPARISON:  03/28/2019 FINDINGS: Brain: Extensive streak artifact from EEG electrodes. There is no mass, hemorrhage or extra-axial collection. The size and configuration of the ventricles and extra-axial CSF spaces are normal. The brain parenchyma is normal, without acute or chronic infarction. Vascular: No abnormal hyperdensity of the major intracranial arteries or dural venous sinuses. No intracranial atherosclerosis. Skull: The visualized skull base, calvarium and extracranial soft tissues are normal. Sinuses/Orbits: No fluid levels or advanced mucosal thickening of the visualized paranasal sinuses. No mastoid or middle ear effusion. The orbits are normal. IMPRESSION: Extensive streak artifact from EEG electrodes. No visible acute intracranial abnormality. Electronically Signed   By:  Ulyses Jarred M.D.   On: 03/30/2019 02:04   CT Head Wo Contrast  Result Date: 03/28/2019 CLINICAL DATA:  Change in mental status EXAM: CT HEAD WITHOUT CONTRAST TECHNIQUE: Contiguous axial images were obtained from the base of the skull through the vertex without intravenous contrast. COMPARISON:  Jul 16, 2018 FINDINGS: Brain: No evidence of acute territorial infarction, hemorrhage, hydrocephalus,extra-axial collection or mass lesion/mass effect. Normal gray-white differentiation. Ventricles are normal in size and contour. Vascular: No hyperdense vessel or unexpected calcification. Skull: The skull is intact. No fracture or focal lesion identified. Sinuses/Orbits: The visualized paranasal sinuses and mastoid air cells are clear. The orbits and globes intact. Other: None IMPRESSION: No acute intracranial abnormality. Electronically Signed   By: Prudencio Pair M.D.   On: 03/28/2019 23:24   MR BRAIN WO CONTRAST  Result Date: 04/04/2019 CLINICAL DATA:  Encephalopathy.  Seizure. EXAM: MRI HEAD WITHOUT CONTRAST TECHNIQUE: Multiplanar, multiecho pulse sequences of the brain and surrounding structures were obtained without intravenous contrast. COMPARISON:  Head CT 03/30/2019.  MRI 07/03/2018. FINDINGS: Brain: The brain has a normal appearance without evidence of malformation, atrophy, old or acute small or large vessel infarction, mass lesion, hemorrhage, hydrocephalus or extra-axial collection. Mesial temporal lobes appear symmetric and normal. Small arachnoid cyst/developmental thinning of the sphenoid bone on the right as seen previously. Vascular: Major vessels at the base of the brain show flow. Venous sinuses appear patent. Skull and upper cervical spine: Normal. Sinuses/Orbits: Clear/normal. Other: None significant. IMPRESSION: No abnormality of the brain parenchyma identified to explain the clinical presentation. 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 I do not see evidence an encephalocele. Electronically Signed   By: Nelson Chimes M.D.   On: 04/04/2019 17:35   DG Chest Port 1 View  Result Date: 04/05/2019 CLINICAL DATA:  Acute respiratory failure EXAM: PORTABLE CHEST 1 VIEW COMPARISON:  Radiograph 04/04/2019 FINDINGS: Right IJ catheter tip terminates at the right atrium. Telemetry leads overlie the chest. Lung volumes are diminished from 1 day prior with more streaky bandlike areas of opacity favoring subsegmental atelectasis. Some persistent patchy opacity is noted in the right suprahilar lung and left base. Cardiomediastinal contours are similar to prior counting for differences in inflation. No pneumothorax. No effusion. No acute osseous or soft tissue abnormality. IMPRESSION: Diminished lung volumes  with more streaky areas of opacity favoring subsegmental atelectasis. Persistent patchy opacity in the right suprahilar lung and left base, worsened in the left lung base although this may be accentuated by volume loss. Electronically Signed   By: Lovena Le M.D.   On: 04/05/2019 06:24   DG Chest Port 1 View  Result Date: 04/04/2019 CLINICAL DATA:  Update status. Intubated patient with hypoxia. EXAM: PORTABLE CHEST 1 VIEW COMPARISON:  Prior radiographs 04/02/2019 and 04/03/2019. FINDINGS: 0444 hours. The endotracheal tube, enteric tube and right IJ central venous catheter are unchanged. The heart size and mediastinal contours are stable. There are patchy bibasilar airspace opacities with interval improvement in the components at the right apex and left lung base. Overall significant improvement over the last 3 days. No pneumothorax or large pleural effusion. The bones appear unchanged. IMPRESSION: Interval improvement in bilateral airspace opacities. Stable support system. Electronically Signed   By: Richardean Sale M.D.   On: 04/04/2019 08:05   Portable Chest xray  Result Date: 04/03/2019 CLINICAL DATA:  Hypoxia EXAM: PORTABLE  CHEST 1 VIEW COMPARISON:  April 02, 2019 FINDINGS: Endotracheal tube tip is 4.5 cm above the carina. Nasogastric tube tip and side port are below the diaphragm. Central catheter tip is in the superior vena cava near the cavoatrial junction. No pneumothorax. There is airspace opacity in the right upper lobe, medial right base, and left mid to lower lung regions. There is overall increase in opacity compared to 1 day prior. Heart size and pulmonary vascularity are normal. No adenopathy. No bone lesions. IMPRESSION: Tube and catheter positions as described without pneumothorax. Multifocal infiltrate, progressed from 1 day prior. Suspect pneumonia, although a degree of aspiration could present similarly. Both entities may be present concurrently. Stable cardiac silhouette. Electronically Signed   By: Lowella Grip III M.D.   On: 04/03/2019 08:46   Portable Chest x-ray  Result Date: 04/02/2019 CLINICAL DATA:  Reintubated. Verify tube placement. EXAM: PORTABLE CHEST 1 VIEW COMPARISON:  04/01/2019 FINDINGS: Endotracheal tube has been placed, tip 6 centimeters above the carina. RIGHT IJ central line tip overlies the superior vena cava. Nasogastric tube is in place, tip overlying the level of the stomach. Stomach is now decompressed. Heart size is normal. There are patchy parenchymal opacities throughout the lungs, slightly improved compared with most recent exam. IMPRESSION: Interval placement of endotracheal tube, tip 6 centimeters above the carina. Electronically Signed   By: Nolon Nations M.D.   On: 04/02/2019 13:12   DG CHEST PORT 1 VIEW  Result Date: 04/02/2019 CLINICAL DATA:  Hypoxemia EXAM: PORTABLE CHEST 1 VIEW COMPARISON:  Hypoxemia FINDINGS: Right IJ central venous catheter tip terminates at the level of the right atrium. Telemetry leads and additional monitoring devices overlie the chest wall. There are rapidly developing bilateral patchy interstitial and airspace opacities throughout both lungs  which are new from 1 day prior. Improving aeration of the right middle and lower lobes of with dense opacity in the medial right lung base. No visible pneumothorax. Suspect small right effusion with obscuration of the hemidiaphragm. There is gaseous distention of the stomach. Portions of the cardiomediastinal contours are obscured by overlying opacity. Visualized portions are unremarkable. IMPRESSION: 1. Rapidly developing bilateral patchy interstitial and airspace opacities throughout both lungs. Could reflect edema, ARDS or infection. 2. Improving aeration of the right middle and lower lobes out although with dense residual opacity in the medial right lung base. Electronically Signed   By: Lovena Le M.D.   On: 04/02/2019 00:12  DG Chest Port 1 View  Result Date: 03/31/2019 CLINICAL DATA:  Ventilator dependent respiratory failure. Follow-up RIGHT basilar atelectasis and/or pneumonia. EXAM: PORTABLE CHEST 1 VIEW COMPARISON:  03/29/2019 and earlier. FINDINGS: Endotracheal tube tip in satisfactory position approximately 5 cm above the carina. RIGHT jugular central venous catheter tip projects over the LOWER SVC, unchanged. Nasogastric tube courses below the diaphragm into the stomach. Cardiac silhouette normal in size, unchanged. Worsening atelectasis involving the RIGHT MIDDLE LOBE and RIGHT LOWER LOBE since the examination 2 days ago. RIGHT UPPER LOBE and LEFT lung remain clear. IMPRESSION: 1. Support apparatus satisfactory. 2. Worsening atelectasis involving the RIGHT MIDDLE LOBE and RIGHT LOWER LOBE since the examination 2 days ago, query mucous plugging in the bronchus intermedius. 3. No new abnormalities. Electronically Signed   By: Evangeline Dakin M.D.   On: 03/31/2019 09:49   DG CHEST PORT 1 VIEW  Result Date: 03/29/2019 CLINICAL DATA:  Clotted central line. EXAM: PORTABLE CHEST 1 VIEW COMPARISON:  March 29, 2019 FINDINGS: There is stable endotracheal tube and nasogastric tube positioning. Since  the prior study there is been interval placement of a right internal jugular venous catheter with its distal tip seen just beyond the junction of the superior vena cava and right atrium. Mild atelectasis and/or infiltrate is seen within the right lung base. This represents a new finding when compared to the prior study. There is no evidence of a pleural effusion or pneumothorax. The heart size and mediastinal contours are within normal limits. The visualized skeletal structures are unremarkable. IMPRESSION: 1. Interval right internal jugular venous catheter placement and positioning, as described above, when compared to the prior chest plain film dated March 29, 2019 (acquired at 6:35 p.m.). 2. Mild right basilar atelectasis and/or infiltrate which represents a new finding when compared to the prior exam. Electronically Signed   By: Virgina Norfolk M.D.   On: 03/29/2019 23:37   Portable Chest x-ray  Result Date: 03/29/2019 CLINICAL DATA:  Encounter for ET tube and OG tube placement EXAM: PORTABLE CHEST 1 VIEW COMPARISON:  Earlier today FINDINGS: Interval placement of ET tube with tip above carina. There is an enteric tube with tip and side port well below the level of the GE junction. The heart size appears within normal limits. No pleural effusion or edema. No airspace densities. IMPRESSION: 1. Satisfactory position of ET tube and enteric tube. 2. Lungs clear. Electronically Signed   By: Kerby Moors M.D.   On: 03/29/2019 19:18   DG Chest Port 1 View  Result Date: 03/29/2019 CLINICAL DATA:  Leukocytosis EXAM: PORTABLE CHEST 1 VIEW COMPARISON:  07/02/2018 FINDINGS: The heart size and mediastinal contours are within normal limits. Both lungs are clear. The visualized skeletal structures are unremarkable. IMPRESSION: No active disease. Electronically Signed   By: Ulyses Jarred M.D.   On: 03/29/2019 00:36   EEG adult  Result Date: 03/29/2019 Lora Havens, MD     03/29/2019  1:28 PM Patient Name:  Carmen Daniels MRN: [id number] Epilepsy Attending: Lora Havens Referring Physician/Provider: Dr Kara Mead Date: 03/29/2019 Duration: 25.04 mins Patient history: 34 yo F who presented with ams. Likely suicidal overdose with dilated pupils and intermittent myoclonus-like activity.  EEG to evaluate for seizures. Level of alertness: Lethargic AEDs during EEG study: Lorazepam Technical aspects: This EEG study was done with scalp electrodes positioned according to the 10-20 International system of electrode placement. Electrical activity was acquired at a sampling rate of 500Hz  and reviewed with a high frequency  filter of 70Hz  and a low frequency filter of 1Hz . EEG data were recorded continuously and digitally stored. Description: No clear posterior dominant rhythm was seen.  EEG showed continuous generalized mixed frequencies including 3 to 6 Hz theta-delta slowing as well as 8 to 9 Hz generalized alpha activity and intermittent 13 to 15 Hz generalized beta activity, maximal frontocentral. EEG was reactive to noxious stimulation.  Hyperventilation photic summation were not performed due to AMS. Abnormality -Continuous slow, generalized IMPRESSION: This study is suggestive of severe diffuse encephalopathy, nonspecific to etiology. No seizures or epileptiform discharges were seen throughout the recording. Priyanka [name]   Overnight EEG with video  Result Date: 03/30/2019 Lora Havens, MD     03/31/2019  9:09 AM Patient Name: AZELIN DEVAULT MRN: [id number] Epilepsy Attending: Lora Havens Referring Physician/Provider: Dr. Kathrynn Speed Duration: 03/29/2019 2315  To 03/30/2019 2315 Patient history: 34 yo F who presented with ams. Likely suicidal overdose with dilated pupils and intermittent myoclonus-like activity.  EEG to evaluate for seizures Level of alertness: comatose/sedated AEDs during EEG study: propofol, versed, keppra, dilantin Technical aspects: This EEG study was done with scalp electrodes  positioned according to the 10-20 International system of electrode placement. Electrical activity was acquired at a sampling rate of 500Hz  and reviewed with a high frequency filter of 70Hz  and a low frequency filter of 1Hz . EEG data were recorded continuously and digitally stored. Description: EEG initially showed continuous generalized low amplitude 23 Hz delta slowing with intermittent (0.25 Hz) right anterior temporal sharp waves.  Gradually after midnight eeg showed continuous generalized 2 to 3 Hz delta activity with overriding 15 to 18 Hz, 2-3 uV beta activity with irregular morphology distributed symmetrically and diffusely.   Hyperventilation and photic stimulation were not performed. Abnormality -Sharp wave,  Right anterior temporal region -Continuous slow, generalized -Excessive beta, generalized IMPRESSION: This study initially showed evidence of potential epileptogenicity from the right anterior temporal region.  After around midnight, EEG showed profound diffuse encephalopathy, nonspecific to etiology but most likely secondary to sedation. The excessive beta activity seen in the background is most likely due to the effect of benzodiazepine and is a benign EEG pattern. No seizures were seen throughout the recording. EEG appears significantly improved compared to routine EEG done prior to the study. Lora Havens   ECHOCARDIOGRAM COMPLETE  Result Date: 03/30/2019   ECHOCARDIOGRAM REPORT   Patient Name:   Carmen Daniels Date of Exam: 03/30/2019 Medical Rec #:  [id number]      Height:       70.0 in Accession #:    [id number]     Weight:       187.2 lb Date of Birth:  September 11, 1985       BSA:          2.03 m Patient Age:    33 years       BP:           113/51 mmHg Patient Gender: F              HR:           74 bpm. Exam Location:  Inpatient Procedure: 2D Echo Indications:    shock  History:        Patient has prior history of Echocardiogram examinations, most                 recent 07/03/2018. Risk  Factors:overdose.  Sonographer:    Johny Chess Referring Phys: [id number]  E IZQUIERDO  Sonographer Comments: Echo performed with patient supine and on artificial respirator. Image acquisition challenging due to respiratory motion. IMPRESSIONS  1. Left ventricular ejection fraction, by visual estimation, is 55 to 60%. The left ventricle has normal function. There is no left ventricular hypertrophy. Normal diastolic function  2. Global right ventricle has normal systolic function.The right ventricular size is normal.  3. Left atrial size was normal.  4. Right atrial size was normal.  5. The mitral valve is normal in structure. No evidence of mitral valve regurgitation.  6. The tricuspid valve is normal in structure. Tricuspid valve regurgitation is trivial.  7. The aortic valve is tricuspid. Aortic valve regurgitation is not visualized. No evidence of aortic valve sclerosis or stenosis.  8. The pulmonic valve was not well visualized. Pulmonic valve regurgitation is not visualized.  9. Normal pulmonary artery systolic pressure. FINDINGS  Left Ventricle: Left ventricular ejection fraction, by visual estimation, is 55 to 60%. The left ventricle has normal function. The left ventricle has no regional wall motion abnormalities. The left ventricular internal cavity size was the left ventricle is normal in size. There is no left ventricular hypertrophy. Left ventricular diastolic parameters were normal. Right Ventricle: The right ventricular size is normal. No increase in right ventricular wall thickness. Global RV systolic function is has normal systolic function. The tricuspid regurgitant velocity is 2.02 m/s, and with an assumed right atrial pressure  of 3 mmHg, the estimated right ventricular systolic pressure is normal at 19.3 mmHg. Left Atrium: Left atrial size was normal in size. Right Atrium: Right atrial size was normal in size Pericardium: There is no evidence of pericardial effusion. Mitral Valve: The  mitral valve is normal in structure. No evidence of mitral valve regurgitation. Tricuspid Valve: The tricuspid valve is normal in structure. Tricuspid valve regurgitation is trivial. Aortic Valve: The aortic valve is tricuspid. Aortic valve regurgitation is not visualized. The aortic valve is structurally normal, with no evidence of sclerosis or stenosis. Pulmonic Valve: The pulmonic valve was not well visualized. Pulmonic valve regurgitation is not visualized. Pulmonic regurgitation is not visualized. Aorta: The aortic root is normal in size and structure. Venous: IVC assessment for right atrial pressure unable to be performed due to mechanical ventilation. IAS/Shunts: No atrial level shunt detected by color flow Doppler.  LEFT VENTRICLE PLAX 2D LVIDd:         4.50 cm  Diastology LVIDs:         3.30 cm  LV e' lateral:   20.70 cm/s LV PW:         0.90 cm  LV E/e' lateral: 5.7 LV IVS:        0.80 cm  LV e' medial:    13.90 cm/s LVOT diam:     2.10 cm  LV E/e' medial:  8.6 LV SV:         48 ml LV SV Index:   23.43 LVOT Area:     3.46 cm  RIGHT VENTRICLE RV S prime:     15.70 cm/s TAPSE (M-mode): 1.6 cm LEFT ATRIUM             Index       RIGHT ATRIUM           Index LA diam:        2.50 cm 1.23 cm/m  RA Area:     11.60 cm LA Vol (A2C):   42.0 ml 20.70 ml/m RA Volume:   24.70 ml  12.17 ml/m  LA Vol (A4C):   30.9 ml 15.23 ml/m LA Biplane Vol: 37.9 ml 18.68 ml/m  AORTIC VALVE LVOT Vmax:   105.00 cm/s LVOT Vmean:  69.300 cm/s LVOT VTI:    0.207 m  AORTA Ao Root diam: 3.00 cm MITRAL VALVE                         TRICUSPID VALVE MV Area (PHT): 3.91 cm              TR Peak grad:   16.3 mmHg MV PHT:        56.26 msec            TR Vmax:        202.00 cm/s MV Decel Time: 194 msec MV E velocity: 119.00 cm/s 103 cm/s  SHUNTS MV A velocity: 61.50 cm/s  70.3 cm/s Systemic VTI:  0.21 m MV E/A ratio:  1.93        1.5       Systemic Diam: 2.10 cm  Oswaldo Milian MD Electronically signed by Oswaldo Milian MD  Signature Date/Time: 03/30/2019/12:59:56 PM    Final       The results of significant diagnostics from this hospitalization (including imaging, microbiology, ancillary and laboratory) are listed below for reference.     Microbiology: No results found for this or any previous visit (from the past 240 hour(s)).   Labs: BNP (last 3 results) No results for input(s): BNP in the last 8760 hours. Basic Metabolic Panel: Recent Labs  Lab 04/04/19 0500 04/05/19 0404 04/06/19 0238  NA 141 137 139  K 3.1* 3.4* 3.5  CL 103 97* 100  CO2 28 27 28   GLUCOSE 99 72 117*  BUN 22* 20 17  CREATININE 0.63 0.63 0.68  CALCIUM 8.3* 8.8* 8.8*  MG 1.9 2.0 1.9  PHOS 2.8 4.1 3.0   Liver Function Tests: No results for input(s): AST, ALT, ALKPHOS, BILITOT, PROT, ALBUMIN in the last 168 hours. No results for input(s): LIPASE, AMYLASE in the last 168 hours. No results for input(s): AMMONIA in the last 168 hours. CBC: Recent Labs  Lab 04/04/19 0500 04/05/19 0404 04/06/19 0238  WBC 9.2 11.7* 8.4  HGB 9.7* 10.4* 11.3*  HCT 30.3* 32.1* 34.4*  MCV 94.1 92.5 89.4  PLT 154 173 201   Cardiac Enzymes: No results for input(s): CKTOTAL, CKMB, CKMBINDEX, TROPONINI in the last 168 hours. BNP: Invalid input(s): POCBNP CBG: Recent Labs  Lab 04/03/19 2318 04/04/19 0311 04/04/19 0750 04/04/19 1156 04/04/19 1519  GLUCAP 87 81 94 113* 101*   D-Dimer No results for input(s): DDIMER in the last 72 hours. Hgb A1c No results for input(s): HGBA1C in the last 72 hours. Lipid Profile No results for input(s): CHOL, HDL, LDLCALC, TRIG, CHOLHDL, LDLDIRECT in the last 72 hours. Thyroid function studies No results for input(s): TSH, T4TOTAL, T3FREE, THYROIDAB in the last 72 hours.  Invalid input(s): FREET3 Anemia work up No results for input(s): VITAMINB12, FOLATE, FERRITIN, TIBC, IRON, RETICCTPCT in the last 72 hours. Urinalysis    Component Value Date/Time   COLORURINE YELLOW 03/28/2019 2125    APPEARANCEUR HAZY (A) 03/28/2019 2125   LABSPEC 1.023 03/28/2019 2125   PHURINE 7.0 03/28/2019 2125   GLUCOSEU NEGATIVE 03/28/2019 2125   HGBUR NEGATIVE 03/28/2019 2125   BILIRUBINUR NEGATIVE 03/28/2019 2125   KETONESUR NEGATIVE 03/28/2019 2125   PROTEINUR NEGATIVE 03/28/2019 2125   UROBILINOGEN 0.2 06/14/2013 1513   NITRITE NEGATIVE 03/28/2019 2125  LEUKOCYTESUR NEGATIVE 03/28/2019 2125   Sepsis Labs Invalid input(s): PROCALCITONIN,  WBC,  LACTICIDVEN Microbiology No results found for this or any previous visit (from the past 240 hour(s)).   Time coordinating discharge:  I have spent 35 minutes face to face with the patient and on the ward discussing the patients care, assessment, plan and disposition with other care givers. >50% of the time was devoted counseling the patient about the risks and benefits of treatment/Discharge disposition and coordinating care.   SIGNED:   Damita Lack, MD  [facility] 04/10/2019, 8:07 AM   If 7PM-7AM, please contact night-coverage

## 2019-04-10 NOTE — Plan of Care (Signed)
  Problem: Nutrition: Goal: Adequate nutrition will be maintained Outcome: Progressing   Problem: Coping: Goal: Level of anxiety will decrease Outcome: Progressing   Problem: Elimination: Goal: Will not experience complications related to bowel motility Outcome: Progressing   Problem: Safety: Goal: Ability to remain free from injury will improve Outcome: Progressing   

## 2019-04-10 NOTE — Progress Notes (Signed)
Notified Sharlet Salina of [facility] that patient reqest med for diarrhea. States she had had 3 episodes per day for the past 3 days. RN will continue to monitor.

## 2019-04-11 LAB — GC/CHLAMYDIA PROBE AMP
Chlamydia trachomatis, NAA: NEGATIVE
Neisseria Gonorrhoeae by PCR: NEGATIVE

## 2019-04-17 ENCOUNTER — Telehealth: Payer: Self-pay | Admitting: Neurology

## 2019-04-17 MED ORDER — ONDANSETRON HCL 4 MG PO TABS: ORAL_TABLET | ORAL | 5 refills | Status: DC

## 2019-04-17 NOTE — Addendum Note (Signed)
Addended by: Desmond Lope on: 04/17/2019 05:25 PM   Modules accepted: Orders

## 2019-04-17 NOTE — Telephone Encounter (Signed)
I returned the call to her mother, Manuela Schwartz (on [place]). She is aware that a prescription the Ajovy auto-injector was sent 03/19/19 with 11 refills. [facility] is [facility].  Her mother reports shortly after the patient's dog passed away, she attempted suicide by overdosing with her medications. States she was in ICU then eventually transferred to an inpatient behavioral health unit in [place]. She was discharged yesterday.  They are in the process of establishing new psychiatric care. They are also attempting to locate a suboxone clinic who can help her get off her long-time use of opioids.  She is having an increase in migraines and has sumatriptan which helps. She is having a lot of nausea and her mother is asking if a prescription for ondansetron can be sent in for her. Says they gave her this medication while she was hospitalized and it was beneficial.

## 2019-04-17 NOTE — Telephone Encounter (Signed)
Patient mother called to inform patient prefers the auto injector for the ajovy she has trouble with the needle states she takes her ajovy on the first of the month and would need it for this upcoming week.

## 2019-04-17 NOTE — Telephone Encounter (Signed)
Per vo by Dr. Krista Blue, she will allow ondansetron 4mg , one tablet every 8 hrs prn for nausea, #10 per month, no early refills. The patient's mother is aware a prescription is being sent into the pharmacy.

## 2019-04-30 ENCOUNTER — Telehealth: Payer: Self-pay

## 2019-04-30 MED ORDER — BOTOX 100 UNITS IJ SOLR: 200.0000 [IU] | INTRAMUSCULAR | 3 refills | Status: DC

## 2019-04-30 NOTE — Telephone Encounter (Signed)
I have called and scheduled delivery for this patients Botox, would you send a script over to [facility] [place] for this patients Botox?

## 2019-04-30 NOTE — Telephone Encounter (Signed)
Refill sent to requested pharmacy.

## 2019-05-02 NOTE — Telephone Encounter (Signed)
Noted, thank you. DWD

## 2019-05-03 DIAGNOSIS — Z0289 Encounter for other administrative examinations: Secondary | ICD-10-CM

## 2019-05-08 ENCOUNTER — Telehealth: Payer: Self-pay | Admitting: *Deleted

## 2019-05-08 NOTE — Telephone Encounter (Signed)
I called patient to reschedule her Botox appt from 05/09/2019.  Message left that we are still waiting on the PA for the visit and to please call back so we can get the appointment rescheduled.  Appointment was canceled.

## 2019-05-09 ENCOUNTER — Telehealth: Payer: Self-pay | Admitting: Neurology

## 2019-05-09 ENCOUNTER — Ambulatory Visit: Payer: Self-pay | Admitting: Neurology

## 2019-05-09 NOTE — Telephone Encounter (Signed)
I returned the call to her mother. The patient had a recent suicide attempt and was hospitalized. She had new onset seizures due to overdose. She was placed on Keppra 750mg  BID. They need an appt to discuss this new medical concern. She was scheduled on 05/17/19.  She is waiting approval for Botox and will scheduled once the medication arrives.

## 2019-05-09 NOTE — Telephone Encounter (Signed)
Pt's mother Manuela Schwartz is wanting to discuss with RN pt's medication. Please advise.

## 2019-05-16 ENCOUNTER — Telehealth: Payer: Self-pay | Admitting: *Deleted

## 2019-05-16 NOTE — Telephone Encounter (Signed)
PA for Ajovy 225mg  submitted through [facility]. Pt [id number] R.  LS:[id number].  Approved through 05/15/2020.

## 2019-05-17 ENCOUNTER — Encounter: Payer: Self-pay | Admitting: Neurology

## 2019-05-17 ENCOUNTER — Ambulatory Visit: Payer: Medicaid Other | Admitting: Neurology

## 2019-05-17 ENCOUNTER — Other Ambulatory Visit: Payer: Self-pay

## 2019-05-17 VITALS — BP 123/78 | HR 97 | Temp 97.9°F | Ht 70.0 in | Wt 191.5 lb

## 2019-05-17 DIAGNOSIS — G43719 Chronic migraine without aura, intractable, without status migrainosus: Secondary | ICD-10-CM | POA: Diagnosis not present

## 2019-05-17 DIAGNOSIS — F319 Bipolar disorder, unspecified: Secondary | ICD-10-CM | POA: Insufficient documentation

## 2019-05-17 DIAGNOSIS — G40901 Epilepsy, unspecified, not intractable, with status epilepticus: Secondary | ICD-10-CM | POA: Diagnosis not present

## 2019-05-17 MED ORDER — LEVETIRACETAM 250 MG PO TABS: 250.0000 mg | ORAL_TABLET | Freq: Two times a day (BID) | ORAL | 1 refills | Status: DC

## 2019-05-17 NOTE — Progress Notes (Signed)
PATIENT: Carmen Daniels DOB: March 04, 1985  Chief Complaint  Patient presents with  . New seizure    EMG room 4. She is here with her mother, Carmen Daniels (temp: 97.6). New consult for seizure following overdose. She was placed on Keppra in hospital. She is currently on 750mg  po BID. She would like to discuss weaning off medication  . Migraine    Botox was cx last week. She is waiting on insurnace approval. I spoke with Carmen Daniels who states its still pending approval. She will contact pt once approved to get this rescheduled.      HISTORICAL  Carmen Daniels has of being a patient of our office for many years for chronic migraine headaches, she also has history of bipolar disorder, today she came in with her mother to follow-up her hospital discharge in February 2021 for polypharmacy overdose, status epilepticus  She was admitted to the hospital on March 28, 2019 with intentional overdose, this including Wellbutrin, Ambien, Flexeril, then she was noted to have intermittent whole body shaking, progressed to have seizure-like activity, was intubated on March 29, 2019, was loaded with Versed, Dilantin, she was noted to have 10 to 15 seconds of seizure activity followed by quiescence.  Then she was loaded with IV Keppra, midazolam, followed by propofol drip, which stopped the clinical seizure activity, later continuous EEG showed diffuse continuous slowing, system with severe encephalopathy, she was seen by epileptologist Dr. Hortense Daniels on April 05, 2019, her seizure were likely provoked in the setting of Wellbutrin overdose, most likely she will not require long-term antiemetic medications, continue seizure precaution, gradually wean off Keppra,  Her hospital course was also complicated by aspiration pneumonia, right hand cellulitis, was treated with Levaquin, Flagyl.  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,  Continuous EEG monitoring showed severe diffuse encephalopathy, continuous generalized slowing,  Laboratory evaluation on March 28, 2019 showed positive for benzodiazepine, lithium level was 0.11, plasma lactic acid was 4.1,  Most recent laboratory evaluation in February 2021, CBC showed mild anemia hemoglobin of 11.3, BMP showed no significant abnormalities  She now complains of short-term memory loss, had no recurrent seizure-like activity, has moved in with her parents, also complains of increased migraine headaches,  She began to receive Botox injection as migraine prevention since March 2017, most recent injection was in December 2020  She previously on polypharmacy for her mood disorder, now only on Zyprexa 50 mg at bedtime  REVIEW OF SYSTEMS: Full 14 system review of systems performed and notable only for as above All other review of systems were negative.  ALLERGIES: Allergies  Allergen Reactions  . Keflex [Cephalexin] Anaphylaxis  . Flagyl [Metronidazole] Diarrhea    HOME MEDICATIONS: Current Outpatient Medications  Medication Sig Dispense Refill  . albuterol (VENTOLIN HFA) 108 (90 Base) MCG/ACT inhaler Inhale 1-2 puffs into the lungs every 4 (four) hours as needed for wheezing or shortness of breath. 1 Inhaler 0  . beclomethasone (QVAR) 40 MCG/ACT inhaler Inhale 1 puff into the lungs 2 (two) times daily as needed (asthma).     . botulinum toxin Type A (BOTOX) 100 units SOLR injection Inject 200 Units into the muscle every 3 (three) months. Inject into head and neck muscles 2 each 3  . Fremanezumab-vfrm (AJOVY) 225 MG/1.5ML SOAJ Inject 225 mg into the  skin every 30 (thirty) days. 1.5 mL 11  . levETIRAcetam (KEPPRA) 750 MG tablet Take 750 mg by mouth 2 (two) times daily.    Marland Kitchen lithium 300 MG tablet Take 300 mg by mouth in the morning and at bedtime.    Marland Kitchen OLANZapine (ZYPREXA) 15 MG tablet Take 15  mg by mouth at bedtime.    . SUMAtriptan (IMITREX) 100 MG tablet TAKE 1 TABLET AT ONSET MAY REPEAT IN 2 HOURS IF NEEDED MAY DOSE 2 PER DAY OR 3 (Patient taking differently: Take 100 mg by mouth every 2 (two) hours as needed for migraine. MAY DOSE 2 PER DAY OR 3) 12 tablet 11   No current facility-administered medications for this visit.    PAST MEDICAL HISTORY: Past Medical History:  Diagnosis Date  . Acute rhinosinusitis 07/03/2018  . Allergic rhinitis   . Anxiety   . Asthma   . Back pain   . Bipolar disorder ([place])   . Borderline personality disorder ([place])   . Chronic pain   . Chronic pain syndrome   . Depression   . Hallucinations   . Migraines   . Nasal polyps   . Nausea   . Neck pain   . OCD (obsessive compulsive disorder)   . OCD (obsessive compulsive disorder)   . Ovarian cyst    left    PAST SURGICAL HISTORY: Past Surgical History:  Procedure Laterality Date  . NASAL SINUS SURGERY    . WISDOM TOOTH EXTRACTION      FAMILY HISTORY: Family History  Problem Relation Age of Onset  . Healthy Father   . Healthy Mother   . Arthritis Maternal Grandmother   . Arthritis Maternal Grandfather   . Depression Paternal Grandmother     SOCIAL HISTORY: Social History   Socioeconomic History  . Marital status: Married    Spouse name: Not on file  . Number of children: 0  . Years of education: 25  . Highest education level: Not on file  Occupational History  . Occupation: Unemployed  Tobacco Use  . Smoking status: Current Every Day Smoker    Packs/day: 0.50    Types: Cigarettes  . Smokeless tobacco: Never Used  Substance and Sexual Activity  . Alcohol use: Yes    Alcohol/week: 0.0 standard drinks    Comment: Occasional alcohol use  . Drug use: No  . Sexual activity: Yes    Birth control/protection: Condom  Other Topics Concern  . Not on file  Social History Narrative   Lives at home alone.   Right-handed.   3 glasses of green tea per day.   Social  Determinants of Health   Financial Resource Strain:   . Difficulty of Paying Living Expenses:   Food Insecurity:   . Worried About Charity fundraiser in the Last Year:   . Arboriculturist in the Last Year:   Transportation Needs:   . Film/video editor (Medical):   Marland Kitchen Lack of Transportation (Non-Medical):   Physical Activity:   . Days of Exercise per Week:   . Minutes of Exercise per Session:   Stress:   . Feeling of Stress :   Social Connections:   . Frequency of Communication with Friends and Family:   . Frequency of Social Gatherings with Friends and Family:   . Attends Religious Services:   . Active Member of Clubs or Organizations:   . Attends Archivist Meetings:   Marland Kitchen Marital Status:   Intimate Production manager  Violence:   . Fear of Current or Ex-Partner:   . Emotionally Abused:   Marland Kitchen Physically Abused:   . Sexually Abused:      PHYSICAL EXAM   Vitals:   05/17/19 1109  BP: 123/78  Pulse: 97  Temp: 97.9 F (36.6 C)  Weight: 191 lb 8 oz (86.9 kg)  Height: 5\' 10"  (1.778 m)    Not recorded      Body mass index is 27.48 kg/m.  PHYSICAL EXAMNIATION:  Gen: NAD, conversant, well nourised, well groomed                     Cardiovascular: Regular rate rhythm, no peripheral edema, warm, nontender. Eyes: Conjunctivae clear without exudates or hemorrhage Neck: Supple, no carotid bruits. Pulmonary: Clear to auscultation bilaterally   NEUROLOGICAL EXAM:  MENTAL STATUS: Speech:    Speech is normal; fluent and spontaneous with normal comprehension.  Cognition:     Orientation to time, place and person     Normal recent and remote memory     Normal Attention span and concentration     Normal Language, naming, repeating,spontaneous speech     Fund of knowledge   CRANIAL NERVES: CN II: Visual fields are full to confrontation. Pupils are round equal and briskly reactive to light. CN III, IV, VI: extraocular movement are normal. No ptosis. CN V: Facial  sensation is intact to light touch CN VII: Face is symmetric with normal eye closure  CN VIII: Hearing is normal to causal conversation. CN IX, X: Phonation is normal. CN XI: Head turning and shoulder shrug are intact  MOTOR: There is no pronator drift of out-stretched arms. Muscle bulk and tone are normal. Muscle strength is normal.  REFLEXES: Reflexes are 2+ and symmetric at the biceps, triceps, knees, and ankles. Plantar responses are flexor.  SENSORY: Intact to light touch, pinprick and vibratory sensation are intact in fingers and toes.  COORDINATION: There is no trunk or limb dysmetria noted.  GAIT/STANCE: Posture is normal. Gait is steady with normal steps, base, arm swing, and turning.    DIAGNOSTIC DATA (LABS, IMAGING, TESTING) - I reviewed patient records, labs, notes, testing and imaging myself where available.   ASSESSMENT AND PLAN  NOLA MOHS is a 34 y.o. 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 Ph.D.  [facility] [address] [place], [place] [address] Ph: [phone] Fax: [phone]  CC: Referring Provider

## 2019-05-17 NOTE — Patient Instructions (Signed)
Keppra 250mg    2 tablets twice a day for one week One table twice a day for one week,   Then stop

## 2019-05-24 ENCOUNTER — Telehealth: Payer: Self-pay | Admitting: *Deleted

## 2019-05-24 NOTE — Telephone Encounter (Signed)
I returned patients call and scheduled her appointment.

## 2019-05-24 NOTE — Telephone Encounter (Signed)
Pt 's mother Stewart,Susan(on [place]) is asking for a call from [facility] re: when pt can be scheduled for Botox

## 2019-05-24 NOTE — Telephone Encounter (Signed)
I called Medicaid to check the status of the PA for pateints Botox.  I spoke to [place] who states the request has been approved.  The PA# is [id number] Valid from 05-22-2019-05/16/2020 for 2 visits.  Ref # for this call is [id number]

## 2019-05-31 ENCOUNTER — Other Ambulatory Visit: Payer: Self-pay

## 2019-05-31 ENCOUNTER — Ambulatory Visit: Payer: Medicaid Other | Admitting: Neurology

## 2019-05-31 DIAGNOSIS — G40901 Epilepsy, unspecified, not intractable, with status epilepticus: Secondary | ICD-10-CM

## 2019-05-31 DIAGNOSIS — F319 Bipolar disorder, unspecified: Secondary | ICD-10-CM

## 2019-05-31 DIAGNOSIS — G43719 Chronic migraine without aura, intractable, without status migrainosus: Secondary | ICD-10-CM

## 2019-06-05 ENCOUNTER — Other Ambulatory Visit: Payer: Self-pay

## 2019-06-05 ENCOUNTER — Encounter: Payer: Self-pay | Admitting: Neurology

## 2019-06-05 ENCOUNTER — Ambulatory Visit: Payer: Medicaid Other | Admitting: Neurology

## 2019-06-05 VITALS — BP 123/77 | HR 93 | Temp 97.5°F | Ht 70.0 in | Wt 204.0 lb

## 2019-06-05 DIAGNOSIS — R569 Unspecified convulsions: Secondary | ICD-10-CM

## 2019-06-05 DIAGNOSIS — G43719 Chronic migraine without aura, intractable, without status migrainosus: Secondary | ICD-10-CM

## 2019-06-05 DIAGNOSIS — F319 Bipolar disorder, unspecified: Secondary | ICD-10-CM | POA: Diagnosis not present

## 2019-06-05 MED ORDER — SUMATRIPTAN SUCCINATE 100 MG PO TABS: ORAL_TABLET | ORAL | 0 refills | Status: DC

## 2019-06-05 MED ORDER — LEVETIRACETAM 250 MG PO TABS: 500.0000 mg | ORAL_TABLET | Freq: Two times a day (BID) | ORAL | 5 refills | Status: DC

## 2019-06-05 NOTE — Patient Instructions (Signed)
Keppra 250 mg  2 tablets twice a day until August 05 2019. Then one tab twice a day from June 14-October 05 2019.  Then stop

## 2019-06-05 NOTE — Progress Notes (Signed)
**  Botox 100 units x 2 vials, NDC [id number], Lot [id number], Exp 12/2021, specialty pharmacy.//mck,rn**

## 2019-06-05 NOTE — Progress Notes (Signed)
PATIENT: Carmen Daniels DOB: 12-May-1985  Chief Complaint  Patient presents with  . Migraine    Botox 100 units x 2 vials - specialty pharmacy     HISTORICAL  Carmen Daniels has of being a patient of our office for many years for chronic migraine headaches, she also has history of bipolar disorder, today she came in with her mother to follow-up her hospital discharge in February 2021 for polypharmacy overdose, status epilepticus  She was admitted to the hospital on March 28, 2019 with intentional overdose, this including Wellbutrin, Ambien, Flexeril, then she was noted to have intermittent whole body shaking, progressed to have seizure-like activity, was intubated on March 29, 2019, was loaded with Versed, Dilantin, she was noted to have 10 to 15 seconds of seizure activity followed by quiescence.  Then she was loaded with IV Keppra, midazolam, followed by propofol drip, which stopped the clinical seizure activity, later continuous EEG showed diffuse continuous slowing, system with severe encephalopathy, she was seen by epileptologist Dr. Hortense Ramal on April 05, 2019, her seizure were likely provoked in the setting of Wellbutrin overdose, most likely she will not require long-term antiemetic medications, continue seizure precaution, gradually wean off Keppra,  Her hospital course was also complicated by aspiration pneumonia, right hand cellulitis, was treated with Levaquin, Flagyl.  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,  Continuous EEG monitoring showed severe diffuse encephalopathy, continuous generalized slowing,  Laboratory evaluation on March 28, 2019 showed positive for benzodiazepine, lithium level was 0.11, plasma lactic acid was 4.1,  Most recent laboratory evaluation in February 2021, CBC showed  mild anemia hemoglobin of 11.3, BMP showed no significant abnormalities  She now complains of short-term memory loss, had no recurrent seizure-like activity, has moved in with her parents, also complains of increased migraine headaches,  She began to receive Botox injection as migraine prevention since March 2017, most recent injection was in December 2020  She previously on polypharmacy for her mood disorder, now only on Zyprexa 50 mg at bedtime  UPDATE June 08 2019: Her EEG was normal on May 31, 2019.  She does not have recurrent seizure, she did have status epilepticus during her hospital admission on March 28, 2019 due to polypharmacy overdose, she is not taking Keppra 750 mg twice a day, I have suggested her for total treatment of 6 months, stepping down to Keppra 500 mg for 2 months, then 250 mg for 2 months, then stop,  She complains of frequent headaches, used up all her monthly Imitrex supply, came in for Botox injection today  REVIEW OF SYSTEMS: Full 14 system review of systems performed and notable only for as above All other review of systems were negative.  ALLERGIES: Allergies  Allergen Reactions  . Keflex [Cephalexin] Anaphylaxis  . Flagyl [Metronidazole] Diarrhea    HOME MEDICATIONS: Current Outpatient Medications  Medication Sig Dispense Refill  . albuterol (VENTOLIN HFA) 108 (90 Base) MCG/ACT inhaler Inhale 1-2 puffs into the lungs every 4 (four) hours as needed for wheezing or shortness of breath. 1 Inhaler 0  . beclomethasone (QVAR) 40 MCG/ACT inhaler Inhale 1 puff into the lungs 2 (two) times daily as needed (asthma).     . botulinum toxin Type A (BOTOX) 100 units SOLR injection Inject 200 Units into the muscle every 3 (three)  months. Inject into head and neck muscles 2 each 3  . Fremanezumab-vfrm (AJOVY) 225 MG/1.5ML SOAJ Inject 225 mg into the skin every 30 (thirty) days. 1.5 mL 11  . levETIRAcetam (KEPPRA) 250 MG tablet Take 1 tablet (250 mg total) by mouth 2  (two) times daily. 30 tablet 1  . levETIRAcetam (KEPPRA) 750 MG tablet Take 750 mg by mouth 2 (two) times daily.    Marland Kitchen lithium 300 MG tablet Take 300 mg by mouth in the morning and at bedtime.    Marland Kitchen OLANZapine (ZYPREXA) 20 MG tablet Take 20 mg by mouth at bedtime.    . SUMAtriptan (IMITREX) 100 MG tablet TAKE 1 TABLET AT ONSET MAY REPEAT IN 2 HOURS IF NEEDED MAY DOSE 2 PER DAY OR 3 (Patient taking differently: Take 100 mg by mouth every 2 (two) hours as needed for migraine. MAY DOSE 2 PER DAY OR 3) 12 tablet 11   No current facility-administered medications for this visit.    PAST MEDICAL HISTORY: Past Medical History:  Diagnosis Date  . Acute rhinosinusitis 07/03/2018  . Allergic rhinitis   . Anxiety   . Asthma   . Back pain   . Bipolar disorder ([place])   . Borderline personality disorder ([employer])   . Chronic pain   . Chronic pain syndrome   . Depression   . Hallucinations   . Migraines   . Nasal polyps   . Nausea   . Neck pain   . OCD (obsessive compulsive disorder)   . OCD (obsessive compulsive disorder)   . Ovarian cyst    left    PAST SURGICAL HISTORY: Past Surgical History:  Procedure Laterality Date  . NASAL SINUS SURGERY    . WISDOM TOOTH EXTRACTION      FAMILY HISTORY: Family History  Problem Relation Age of Onset  . Healthy Father   . Healthy Mother   . Arthritis Maternal Grandmother   . Arthritis Maternal Grandfather   . Depression Paternal Grandmother     SOCIAL HISTORY: Social History   Socioeconomic History  . Marital status: Married    Spouse name: Not on file  . Number of children: 0  . Years of education: 51  . Highest education level: Not on file  Occupational History  . Occupation: Unemployed  Tobacco Use  . Smoking status: Current Every Day Smoker    Packs/day: 0.50    Types: Cigarettes  . Smokeless tobacco: Never Used  Substance and Sexual Activity  . Alcohol use: Yes    Alcohol/week: 0.0 standard drinks    Comment: Occasional alcohol  use  . Drug use: No  . Sexual activity: Yes    Birth control/protection: Condom  Other Topics Concern  . Not on file  Social History Narrative   Lives at home alone.   Right-handed.   3 glasses of green tea per day.   Social Determinants of Health   Financial Resource Strain:   . Difficulty of Paying Living Expenses:   Food Insecurity:   . Worried About Charity fundraiser in the Last Year:   . Arboriculturist in the Last Year:   Transportation Needs:   . Film/video editor (Medical):   Marland Kitchen Lack of Transportation (Non-Medical):   Physical Activity:   . Days of Exercise per Week:   . Minutes of Exercise per Session:   Stress:   . Feeling of Stress :   Social Connections:   . Frequency of Communication with Friends and  Family:   . Frequency of Social Gatherings with Friends and Family:   . Attends Religious Services:   . Active Member of Clubs or Organizations:   . Attends Archivist Meetings:   Marland Kitchen Marital Status:   Intimate Partner Violence:   . Fear of Current or Ex-Partner:   . Emotionally Abused:   Marland Kitchen Physically Abused:   . Sexually Abused:      PHYSICAL EXAM   Vitals:   06/05/19 1536  BP: 123/77  Pulse: 93  Temp: (!) 97.5 F (36.4 C)  Weight: 204 lb (92.5 kg)  Height: 5\' 10"  (1.778 m)    Not recorded      Body mass index is 29.27 kg/m.  PHYSICAL EXAMNIATION:  NEUROLOGICAL EXAM:  MENTAL STATUS: Speech/cognition Awake alert oriented to history taking care of conversation  CRANIAL NERVES: CN II:  Pupils are round equal and briskly reactive to light. CN III, IV, VI: extraocular movement are normal. No ptosis. CN V: Facial sensation is intact to light touch CN VII: Face is symmetric with normal eye closure  CN VIII: Hearing is normal to causal conversation. CN IX, X: Phonation is normal. CN XI: Head turning and shoulder shrug are intact  MOTOR: There is no pronator drift of out-stretched arms. Muscle bulk and tone are normal. Muscle  strength is normal.    COORDINATION: There is no trunk or limb dysmetria noted.  GAIT/STANCE: Posture is normal. Gait is steady with normal steps, base, arm swing, and turning.    DIAGNOSTIC DATA (LABS, IMAGING, TESTING) - I reviewed patient records, labs, notes, testing and imaging myself where available.   ASSESSMENT AND PLAN  MUNTAHA MARRANO is a 34 y.o. 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  Call clinic for recurrent event  Chronic migraine headaches  Imitrex 100 mg as needed, limit the use to less than 3 times each week, may combine with NSAIDs, Zofran  Botox injection for chronic migraine prevention, injection was performed according to [place] protocol,  5 units of Botox was injected into each side, for 31 injection sites, total of 155 units  Bilateral frontalis 4 injection sites Bilateral corrugate 2 injection sites Procerus 1 injection sites. Bilateral temporalis 8 injection sites Bilateral occipitalis 6 injection sites Bilateral cervical paraspinals 4 injection sites Bilateral upper trapezius 6 injection sites  Extra 45 unites were injected into bilateral temporal, masseters  Marcial Pacas, M.D. Ph.D.  [facility] [address], [place], [place] [address] Ph: [phone] Fax: [phone]  CC: Referring Provider

## 2019-06-06 ENCOUNTER — Encounter (HOSPITAL_BASED_OUTPATIENT_CLINIC_OR_DEPARTMENT_OTHER): Payer: Self-pay | Admitting: Obstetrics and Gynecology

## 2019-06-08 DIAGNOSIS — R569 Unspecified convulsions: Secondary | ICD-10-CM | POA: Insufficient documentation

## 2019-06-08 NOTE — Procedures (Signed)
   HISTORY: 34 year old female, with history of bipolar disorder, her hospital admission in February 2021 due to intentional overdose, was in status epilepticus requiring intubation, currently taking Keppra 750 mg twice a day.  TECHNIQUE:  This is a routine 16 channel EEG recording with one channel devoted to a limited EKG recording.  It was performed during wakefulness, drowsiness and asleep.  Hyperventilation and photic stimulation were performed as activating procedures.  There are minimum muscle and movement artifact noted.  Upon maximum arousal, posterior dominant waking rhythm consistent of rhythmic alpha range activity, with frequency of 9 hz. Activities are symmetric over the bilateral posterior derivations and attenuated with eye opening.  Hyperventilation produced mild/moderate buildup with higher amplitude and the slower activities noted.  Photic stimulation did not alter the tracing.  During EEG recording, patient developed drowsiness and no deeper stage of sleep was achieved During EEG recording, there was no epileptiform discharge noted.  EKG demonstrate sinus rhythm, with heart rate of 72 bpm  CONCLUSION: This is a  normal awake EEG.  There is no electrodiagnostic evidence of epileptiform discharge.  Marcial Pacas, M.D. Ph.D.  [facility] [place],  [address] Phone: [phone] Fax:      [phone]

## 2019-06-11 ENCOUNTER — Other Ambulatory Visit (HOSPITAL_COMMUNITY)
Admission: RE | Admit: 2019-06-11 | Discharge: 2019-06-11 | Disposition: A | Discharge status: Home / Self Care | Payer: Medicaid Other | Source: Ambulatory Visit | Attending: Obstetrics and Gynecology | Admitting: Obstetrics and Gynecology

## 2019-06-11 DIAGNOSIS — Z01812 Encounter for preprocedural laboratory examination: Secondary | ICD-10-CM | POA: Diagnosis present

## 2019-06-11 DIAGNOSIS — Z20822 Contact with and (suspected) exposure to covid-19: Secondary | ICD-10-CM | POA: Insufficient documentation

## 2019-06-11 LAB — SARS CORONAVIRUS 2 (TAT 6-24 HRS): SARS Coronavirus 2: NEGATIVE

## 2019-06-11 NOTE — H&P (Signed)
Carmen Daniels is an 34 y.o. female who desires permanent sterilization. BTL papers have been signed.  H/O Bipolar Disorder    Menstrual History: Menarche age: 56 Patient's last menstrual period was 05/30/2019 (exact date).    Past Medical History:  Diagnosis Date  . Acute rhinosinusitis 07/03/2018  . Allergic rhinitis   . Anxiety   . Asthma   . Back pain   . Bipolar disorder ([place])   . Borderline personality disorder ([place])   . Chronic pain   . Chronic pain syndrome   . Depression   . Hallucinations   . Migraines   . Nasal polyps   . Nausea   . Neck pain   . OCD (obsessive compulsive disorder)   . OCD (obsessive compulsive disorder)   . Ovarian cyst    left    Past Surgical History:  Procedure Laterality Date  . NASAL SINUS SURGERY    . WISDOM TOOTH EXTRACTION      Family History  Problem Relation Age of Onset  . Healthy Father   . Healthy Mother   . Arthritis Maternal Grandmother   . Arthritis Maternal Grandfather   . Depression Paternal Grandmother     Social History:  reports that she has been smoking cigarettes. She has been smoking about 0.50 packs per day. She has never used smokeless tobacco. She reports current alcohol use. She reports that she does not use drugs.  Allergies:  Allergies  Allergen Reactions  . Keflex [Cephalexin] Anaphylaxis  . Flagyl [Metronidazole] Diarrhea    No medications prior to admission.    Review of Systems  Constitutional: Negative.   Respiratory: Negative.   Cardiovascular: Negative.   Gastrointestinal: Negative.   Genitourinary: Negative.     Height 5\' 10"  (1.778 m), weight 92 kg, last menstrual period 05/30/2019. Physical Exam  Constitutional: She appears well-developed and well-nourished.  Cardiovascular: Normal rate and regular rhythm.  Respiratory: Effort normal and breath sounds normal.  GI: Soft. Bowel sounds are normal.  Genitourinary:    Genitourinary Comments: Deferred to OR     No results found  for this or any previous visit (from the past 24 hour(s)).  No results found.  Assessment/Plan: Unwanted Fertility  Pt is being admitted for Lap BTL. Pt has tried IUD in the past but was unable to tolerate. Other options of contraception have been reviewed and pt declines. R/B/Failure rate and post op care reviewed with pt. Pt desires to proceed.   Chancy Milroy 06/11/2019, 9:34 AM

## 2019-06-11 NOTE — Progress Notes (Signed)
POCT unable to obtain today,will do day of surgery. Anesthesia consult per Dr. Doroteo Glassman, will proceed with surgery as scheduled.

## 2019-06-11 NOTE — Anesthesia Preprocedure Evaluation (Addendum)
Anesthesia Evaluation  Patient identified by MRN, date of birth, ID band Patient awake    Reviewed: Allergy & Precautions, NPO status , Patient's Chart, lab work & pertinent test results  Airway Mallampati: I  TM Distance: >3 FB Neck ROM: Full    Dental  (+) Teeth Intact, Dental Advisory Given   Pulmonary asthma , Current Smoker and Patient abstained from smoking.,    Pulmonary exam normal breath sounds clear to auscultation       Cardiovascular negative cardio ROS Normal cardiovascular exam Rhythm:Regular Rate:Normal     Neuro/Psych  Headaches, Seizures -, Well Controlled,  Anxiety Depression Bipolar Disorder negative psych ROS   GI/Hepatic negative GI ROS, Neg liver ROS,   Endo/Other  negative endocrine ROS  Renal/GU negative Renal ROS  negative genitourinary   Musculoskeletal negative musculoskeletal ROS (+)   Abdominal Normal abdominal exam  (+) + obese,   Peds negative pediatric ROS (+)  Hematology negative hematology ROS (+)   Anesthesia Other Findings   Reproductive/Obstetrics negative OB ROS                            Anesthesia Physical Anesthesia Plan  ASA: III  Anesthesia Plan: General   Post-op Pain Management:    Induction: Intravenous  PONV Risk Score and Plan: 2 and Ondansetron, Midazolam and Treatment may vary due to age or medical condition  Airway Management Planned: Oral ETT  Additional Equipment:   Intra-op Plan:   Post-operative Plan: Extubation in OR  Informed Consent: I have reviewed the patients History and Physical, chart, labs and discussed the procedure including the risks, benefits and alternatives for the proposed anesthesia with the patient or authorized representative who has indicated his/her understanding and acceptance.     Dental advisory given  Plan Discussed with: CRNA  Anesthesia Plan Comments: (Recent overdose and prolonged hospital  stay at [facility]/transferred to [facility]. Intubated twice for prolonged periods of time, had an episode of what sounds like aspiration pneumonia which has resolved since then. Pt is very unhappy to be having general anesthesia with an endotracheal tube and needed a lot of reassurance about being completely asleep for the procedure. Assured the patient and mother that BIS monitor will be placed pre-induction for monitoring depth of anesthetic. She was also flagged for difficult airway during that hospital stay at [facility] long, but patient and family state they were not provided with any information regarding this flag until she was scheduled for surgery and the scheduler informed them of it. Reassuring airway, Mallampati 1 but somewhat small/receding mandible. Assured the patient and mother that we have special equipment to aid [place] in intubating in the operating room.   Intubation note from 03/29/19 does not mention number of attempts or any difficulty. States only one blade was used: Tax adviser)       Anesthesia Quick Evaluation

## 2019-06-13 ENCOUNTER — Other Ambulatory Visit: Payer: Self-pay

## 2019-06-13 ENCOUNTER — Encounter (HOSPITAL_BASED_OUTPATIENT_CLINIC_OR_DEPARTMENT_OTHER): Payer: Self-pay | Admitting: Obstetrics and Gynecology

## 2019-06-13 ENCOUNTER — Ambulatory Visit (HOSPITAL_BASED_OUTPATIENT_CLINIC_OR_DEPARTMENT_OTHER): Payer: Medicaid Other | Admitting: Anesthesiology

## 2019-06-13 ENCOUNTER — Ambulatory Visit (HOSPITAL_BASED_OUTPATIENT_CLINIC_OR_DEPARTMENT_OTHER)
Admission: RE | Admit: 2019-06-13 | Discharge: 2019-06-13 | Disposition: A | Discharge status: Home / Self Care | Payer: Medicaid Other | Source: Ambulatory Visit | Attending: Obstetrics and Gynecology | Admitting: Obstetrics and Gynecology

## 2019-06-13 ENCOUNTER — Telehealth: Payer: Self-pay

## 2019-06-13 ENCOUNTER — Encounter (HOSPITAL_BASED_OUTPATIENT_CLINIC_OR_DEPARTMENT_OTHER)
Admission: RE | Disposition: A | Discharge status: Home / Self Care | Payer: Self-pay | Source: Ambulatory Visit | Attending: Obstetrics and Gynecology

## 2019-06-13 DIAGNOSIS — F429 Obsessive-compulsive disorder, unspecified: Secondary | ICD-10-CM | POA: Diagnosis not present

## 2019-06-13 DIAGNOSIS — G43909 Migraine, unspecified, not intractable, without status migrainosus: Secondary | ICD-10-CM | POA: Insufficient documentation

## 2019-06-13 DIAGNOSIS — Z818 Family history of other mental and behavioral disorders: Secondary | ICD-10-CM | POA: Insufficient documentation

## 2019-06-13 DIAGNOSIS — Z302 Encounter for sterilization: Secondary | ICD-10-CM

## 2019-06-13 DIAGNOSIS — F419 Anxiety disorder, unspecified: Secondary | ICD-10-CM | POA: Diagnosis not present

## 2019-06-13 DIAGNOSIS — Z6830 Body mass index (BMI) 30.0-30.9, adult: Secondary | ICD-10-CM | POA: Diagnosis not present

## 2019-06-13 DIAGNOSIS — Z881 Allergy status to other antibiotic agents status: Secondary | ICD-10-CM | POA: Insufficient documentation

## 2019-06-13 DIAGNOSIS — J45909 Unspecified asthma, uncomplicated: Secondary | ICD-10-CM | POA: Diagnosis not present

## 2019-06-13 DIAGNOSIS — Z888 Allergy status to other drugs, medicaments and biological substances status: Secondary | ICD-10-CM | POA: Diagnosis not present

## 2019-06-13 DIAGNOSIS — G894 Chronic pain syndrome: Secondary | ICD-10-CM | POA: Diagnosis not present

## 2019-06-13 DIAGNOSIS — R569 Unspecified convulsions: Secondary | ICD-10-CM | POA: Diagnosis not present

## 2019-06-13 DIAGNOSIS — E669 Obesity, unspecified: Secondary | ICD-10-CM | POA: Insufficient documentation

## 2019-06-13 DIAGNOSIS — F17210 Nicotine dependence, cigarettes, uncomplicated: Secondary | ICD-10-CM | POA: Insufficient documentation

## 2019-06-13 DIAGNOSIS — Z8261 Family history of arthritis: Secondary | ICD-10-CM | POA: Diagnosis not present

## 2019-06-13 DIAGNOSIS — F17200 Nicotine dependence, unspecified, uncomplicated: Secondary | ICD-10-CM | POA: Insufficient documentation

## 2019-06-13 DIAGNOSIS — F319 Bipolar disorder, unspecified: Secondary | ICD-10-CM | POA: Insufficient documentation

## 2019-06-13 HISTORY — PX: LAPAROSCOPIC TUBAL LIGATION: SHX1937

## 2019-06-13 LAB — POCT PREGNANCY, URINE: Preg Test, Ur: NEGATIVE

## 2019-06-13 SURGERY — LIGATION, FALLOPIAN TUBE, LAPAROSCOPIC
Anesthesia: General | Site: Vagina | Laterality: Bilateral

## 2019-06-13 MED ORDER — MIDAZOLAM HCL 2 MG/2ML IJ SOLN
INTRAMUSCULAR | Status: AC
  Filled 2019-06-13: qty 2

## 2019-06-13 MED ORDER — FENTANYL CITRATE (PF) 100 MCG/2ML IJ SOLN
INTRAMUSCULAR | Status: DC | PRN
  Administered 2019-06-13 (×3): 100 ug via INTRAVENOUS

## 2019-06-13 MED ORDER — BUPIVACAINE HCL (PF) 0.5 % IJ SOLN
INTRAMUSCULAR | Status: AC
  Filled 2019-06-13: qty 30

## 2019-06-13 MED ORDER — FENTANYL CITRATE (PF) 100 MCG/2ML IJ SOLN
INTRAMUSCULAR | Status: AC
  Filled 2019-06-13: qty 2

## 2019-06-13 MED ORDER — HYDROMORPHONE HCL 1 MG/ML IJ SOLN
0.2500 mg | INTRAMUSCULAR | Status: DC | PRN
  Administered 2019-06-13: 0.5 mg via INTRAVENOUS

## 2019-06-13 MED ORDER — DEXAMETHASONE SODIUM PHOSPHATE 10 MG/ML IJ SOLN
INTRAMUSCULAR | Status: AC
  Filled 2019-06-13: qty 1

## 2019-06-13 MED ORDER — SILVER NITRATE-POT NITRATE 75-25 % EX MISC
CUTANEOUS | Status: AC
  Filled 2019-06-13: qty 10

## 2019-06-13 MED ORDER — KETOROLAC TROMETHAMINE 15 MG/ML IJ SOLN
15.0000 mg | INTRAMUSCULAR | Status: AC
  Administered 2019-06-13: 15 mg via INTRAVENOUS

## 2019-06-13 MED ORDER — ONDANSETRON HCL 4 MG/2ML IJ SOLN
INTRAMUSCULAR | Status: DC | PRN
  Administered 2019-06-13: 4 mg via INTRAVENOUS

## 2019-06-13 MED ORDER — KETOROLAC TROMETHAMINE 15 MG/ML IJ SOLN
INTRAMUSCULAR | Status: AC
  Filled 2019-06-13: qty 1

## 2019-06-13 MED ORDER — PROPOFOL 10 MG/ML IV BOLUS
INTRAVENOUS | Status: AC
  Filled 2019-06-13: qty 40

## 2019-06-13 MED ORDER — KETOROLAC TROMETHAMINE 30 MG/ML IJ SOLN
INTRAMUSCULAR | Status: DC | PRN
  Administered 2019-06-13: 15 mg via INTRAVENOUS

## 2019-06-13 MED ORDER — BUPIVACAINE HCL 0.5 % IJ SOLN
INTRAMUSCULAR | Status: DC | PRN
  Administered 2019-06-13: 30 mL

## 2019-06-13 MED ORDER — SOD CITRATE-CITRIC ACID 500-334 MG/5ML PO SOLN
30.0000 mL | ORAL | Status: AC
  Administered 2019-06-13: 30 mL via ORAL

## 2019-06-13 MED ORDER — LIDOCAINE 2% (20 MG/ML) 5 ML SYRINGE
INTRAMUSCULAR | Status: AC
  Filled 2019-06-13: qty 5

## 2019-06-13 MED ORDER — ACETAMINOPHEN 500 MG PO TABS
1000.0000 mg | ORAL_TABLET | ORAL | Status: AC
  Administered 2019-06-13: 1000 mg via ORAL

## 2019-06-13 MED ORDER — LIDOCAINE HCL (CARDIAC) PF 100 MG/5ML IV SOSY
PREFILLED_SYRINGE | INTRAVENOUS | Status: DC | PRN
  Administered 2019-06-13: 100 mg via INTRAVENOUS

## 2019-06-13 MED ORDER — LACTATED RINGERS IV SOLN: INTRAVENOUS | Status: DC

## 2019-06-13 MED ORDER — SUGAMMADEX SODIUM 500 MG/5ML IV SOLN
INTRAVENOUS | Status: AC
  Filled 2019-06-13: qty 5

## 2019-06-13 MED ORDER — IBUPROFEN 800 MG PO TABS: 800.0000 mg | ORAL_TABLET | Freq: Three times a day (TID) | ORAL | 0 refills | Status: DC | PRN

## 2019-06-13 MED ORDER — OXYCODONE HCL 5 MG/5ML PO SOLN: 5.0000 mg | Freq: Once | ORAL | Status: AC | PRN

## 2019-06-13 MED ORDER — SUGAMMADEX SODIUM 200 MG/2ML IV SOLN
INTRAVENOUS | Status: DC | PRN
  Administered 2019-06-13: 200 mg via INTRAVENOUS

## 2019-06-13 MED ORDER — HYDROMORPHONE HCL 1 MG/ML IJ SOLN
INTRAMUSCULAR | Status: AC
  Filled 2019-06-13: qty 0.5

## 2019-06-13 MED ORDER — OXYCODONE HCL 5 MG PO TABS: 5.0000 mg | ORAL_TABLET | Freq: Four times a day (QID) | ORAL | 0 refills | Status: DC | PRN

## 2019-06-13 MED ORDER — DEXAMETHASONE SODIUM PHOSPHATE 10 MG/ML IJ SOLN
INTRAMUSCULAR | Status: DC | PRN
  Administered 2019-06-13: 6 mg via INTRAVENOUS

## 2019-06-13 MED ORDER — ACETAMINOPHEN 500 MG PO TABS
ORAL_TABLET | ORAL | Status: AC
  Filled 2019-06-13: qty 2

## 2019-06-13 MED ORDER — ROCURONIUM BROMIDE 10 MG/ML (PF) SYRINGE
PREFILLED_SYRINGE | INTRAVENOUS | Status: AC
  Filled 2019-06-13: qty 10

## 2019-06-13 MED ORDER — MIDAZOLAM HCL 2 MG/2ML IJ SOLN
INTRAMUSCULAR | Status: DC | PRN
  Administered 2019-06-13 (×2): 2 mg via INTRAVENOUS

## 2019-06-13 MED ORDER — OXYCODONE HCL 5 MG PO TABS
ORAL_TABLET | ORAL | Status: AC
  Filled 2019-06-13: qty 1

## 2019-06-13 MED ORDER — OXYCODONE HCL 5 MG PO TABS
5.0000 mg | ORAL_TABLET | Freq: Once | ORAL | Status: AC | PRN
  Administered 2019-06-13: 5 mg via ORAL

## 2019-06-13 MED ORDER — ONDANSETRON HCL 4 MG/2ML IJ SOLN
INTRAMUSCULAR | Status: AC
  Filled 2019-06-13: qty 2

## 2019-06-13 MED ORDER — KETOROLAC TROMETHAMINE 30 MG/ML IJ SOLN
INTRAMUSCULAR | Status: AC
  Filled 2019-06-13: qty 1

## 2019-06-13 MED ORDER — ROCURONIUM BROMIDE 10 MG/ML (PF) SYRINGE
PREFILLED_SYRINGE | INTRAVENOUS | Status: DC | PRN
  Administered 2019-06-13: 50 mg via INTRAVENOUS

## 2019-06-13 MED ORDER — SOD CITRATE-CITRIC ACID 500-334 MG/5ML PO SOLN
ORAL | Status: AC
  Filled 2019-06-13: qty 30

## 2019-06-13 MED ORDER — MEPERIDINE HCL 25 MG/ML IJ SOLN: 6.2500 mg | INTRAMUSCULAR | Status: DC | PRN

## 2019-06-13 MED ORDER — PROPOFOL 10 MG/ML IV BOLUS
INTRAVENOUS | Status: DC | PRN
  Administered 2019-06-13: 200 mg via INTRAVENOUS

## 2019-06-13 SURGICAL SUPPLY — 32 items
ADH SKN CLS APL DERMABOND .7 (GAUZE/BANDAGES/DRESSINGS) ×1
CATH ROBINSON RED A/P 16FR (CATHETERS) IMPLANT
CLIP FILSHIE TUBAL LIGA STRL (Clip) ×2 IMPLANT
DERMABOND ADVANCED (GAUZE/BANDAGES/DRESSINGS) ×1
DERMABOND ADVANCED .7 DNX12 (GAUZE/BANDAGES/DRESSINGS) IMPLANT
DRSG OPSITE POSTOP 3X4 (GAUZE/BANDAGES/DRESSINGS) ×1 IMPLANT
DURAPREP 26ML APPLICATOR (WOUND CARE) ×2 IMPLANT
GAUZE 4X4 16PLY RFD (DISPOSABLE) ×2 IMPLANT
GLOVE BIO SURGEON STRL SZ7.5 (GLOVE) ×2 IMPLANT
GLOVE BIOGEL PI IND STRL 6.5 (GLOVE) IMPLANT
GLOVE BIOGEL PI IND STRL 7.0 (GLOVE) ×2 IMPLANT
GLOVE BIOGEL PI IND STRL 8 (GLOVE) ×1 IMPLANT
GLOVE BIOGEL PI INDICATOR 6.5 (GLOVE) ×2
GLOVE BIOGEL PI INDICATOR 7.0 (GLOVE) ×2
GLOVE BIOGEL PI INDICATOR 8 (GLOVE) ×1
GLOVE ECLIPSE 6.5 STRL STRAW (GLOVE) ×1 IMPLANT
GLOVE ECLIPSE 7.0 STRL STRAW (GLOVE) ×1 IMPLANT
GOWN STRL REUS W/TWL LRG LVL3 (GOWN DISPOSABLE) ×2 IMPLANT
GOWN STRL REUS W/TWL XL LVL3 (GOWN DISPOSABLE) ×2 IMPLANT
NS IRRIG 1000ML POUR BTL (IV SOLUTION) ×2 IMPLANT
PACK LAPAROSCOPY BASIN (CUSTOM PROCEDURE TRAY) ×2 IMPLANT
PACK TRENDGUARD 450 HYBRID PRO (MISCELLANEOUS) IMPLANT
PAD OB MATERNITY 4.3X12.25 (PERSONAL CARE ITEMS) ×2 IMPLANT
PAD PREP 24X48 CUFFED NSTRL (MISCELLANEOUS) ×2 IMPLANT
SET TUBE SMOKE EVAC HIGH FLOW (TUBING) ×2 IMPLANT
SLEEVE SCD COMPRESS KNEE MED (MISCELLANEOUS) ×2 IMPLANT
SUT MNCRL AB 4-0 PS2 18 (SUTURE) ×2 IMPLANT
SUT VICRYL 0 UR6 27IN ABS (SUTURE) ×4 IMPLANT
TOWEL GREEN STERILE FF (TOWEL DISPOSABLE) ×4 IMPLANT
TRENDGUARD 450 HYBRID PRO PACK (MISCELLANEOUS) ×2
TROCAR BALLN 12MMX100 BLUNT (TROCAR) ×2 IMPLANT
WARMER LAPAROSCOPE (MISCELLANEOUS) ×2 IMPLANT

## 2019-06-13 NOTE — Transfer of Care (Signed)
Immediate Anesthesia Transfer of Care Note  Patient: Carmen Daniels  Procedure(s) Performed: LAPAROSCOPIC TUBAL LIGATION (Bilateral Vagina )  Patient Location: PACU  Anesthesia Type:General  Level of Consciousness: awake, alert , oriented, drowsy and patient cooperative  Airway & Oxygen Therapy: Patient Spontanous Breathing and Patient connected to face mask oxygen  Post-op Assessment: Report given to RN and Post -op Vital signs reviewed and stable  Post vital signs: Reviewed and stable  Last Vitals:  Vitals Value Taken Time  BP 124/69 06/13/19 1041  Temp    Pulse 103 06/13/19 1045  Resp 11 06/13/19 1045  SpO2 100 % 06/13/19 1045  Vitals shown include unvalidated device data.  Last Pain:  Vitals:   06/13/19 0845  TempSrc: Oral  PainSc: 0-No pain         Complications: No apparent anesthesia complications

## 2019-06-13 NOTE — Telephone Encounter (Signed)
Can move appt to fit their schedule. Legrand Como

## 2019-06-13 NOTE — Telephone Encounter (Signed)
TC from pt mother with question for provider regarding pt  Pt had surgery today and family is planning [place] Trip  2 wks from today.  Pt has post op 07/10/19   Please advise if pt can move post op appt sooner or wait until scheduled appt per pt mother.

## 2019-06-13 NOTE — Discharge Instructions (Signed)
Laparoscopic Tubal Ligation, Care After This sheet gives you information about how to care for yourself after your procedure. Your health care provider may also give you more specific instructions. If you have problems or questions, contact your health care provider. What can I expect after the procedure? After the procedure, it is common to have:  A sore throat.  Discomfort in your shoulder.  Mild discomfort or cramping in your abdomen.  Gas pains.  Pain or soreness in the area where the surgical incision was made.  A bloated feeling.  Tiredness.  Nausea.  Vomiting. Follow these instructions at home: Medicines  Take over-the-counter and prescription medicines only as told by your health care provider.  Do not take aspirin because it can cause bleeding.  Ask your health care provider if the medicine prescribed to you: ? Requires you to avoid driving or using heavy machinery. ? Can cause constipation. You may need to take actions to prevent or treat constipation, such as:  Drink enough fluid to keep your urine pale yellow.  Take over-the-counter or prescription medicines.  Eat foods that are high in fiber, such as beans, whole grains, and fresh fruits and vegetables.  Limit foods that are high in fat and processed sugars, such as fried or sweet foods. Incision care      Follow instructions from your health care provider about how to take care of your incision. Make sure you: ? Wash your hands with soap and water before and after you change your bandage (dressing). If soap and water are not available, use hand sanitizer. ? Change your dressing as told by your health care provider. ? Leave stitches (sutures), skin glue, or adhesive strips in place. These skin closures may need to stay in place for 2 weeks or longer. If adhesive strip edges start to loosen and curl up, you may trim the loose edges. Do not remove adhesive strips completely unless your health care provider  tells you to do that.  Check your incision area every day for signs of infection. Check for: ? Redness, swelling, or pain. ? Fluid or blood. ? Warmth. ? Pus or a bad smell. Activity  Rest as told by your health care provider.  Avoid sitting for a long time without moving. Get up to take short walks every 1-2 hours. This is important to improve blood flow and breathing. Ask for help if you feel weak or unsteady.  Return to your normal activities as told by your health care provider. Ask your health care provider what activities are safe for you. General instructions  Do not take baths, swim, or use a hot tub until your health care provider approves. Ask your health care provider if you may take showers. You may only be allowed to take sponge baths.  Have someone help you with your daily household tasks for the first few days.  Keep all follow-up visits as told by your health care provider. This is important. Contact a health care provider if:  You have redness, swelling, or pain around your incision.  Your incision feels warm to the touch.  You have pus or a bad smell coming from your incision.  The edges of your incision break open after the sutures have been removed.  Your pain does not improve after 2-3 days.  You have a rash.  You repeatedly become dizzy or light-headed.  Your pain medicine is not helping. Get help right away if you:  Have a fever.  Faint.  Have increasing   pain in your abdomen.  Have severe pain in one or both of your shoulders.  Have fluid or blood coming from your sutures or from your vagina.  Have shortness of breath or difficulty breathing.  Have chest pain or leg pain.  Have ongoing nausea, vomiting, or diarrhea. Summary  After the procedure, it is common to have mild discomfort or cramping in your abdomen.  Take over-the-counter and prescription medicines only as told by your health care provider.  Watch for symptoms that should  prompt you to call your health care provider.  Keep all follow-up visits as told by your health care provider. This is important. This information is not intended to replace advice given to you by your health care provider. Make sure you discuss any questions you have with your health care provider. Document Revised: 07/18/2018 Document Reviewed: 01/03/2018 Elsevier Patient Education  [place] Instructions  Activity: Get plenty of rest for the remainder of the day. A responsible individual must stay with you for 24 hours following the procedure.  For the next 24 hours, DO NOT: -Drive a car -Paediatric nurse -Drink alcoholic beverages -Take any medication unless instructed by your physician -Make any legal decisions or sign important papers.  Meals: Start with liquid foods such as gelatin or soup. Progress to regular foods as tolerated. Avoid greasy, spicy, heavy foods. If nausea and/or vomiting occur, drink only clear liquids until the nausea and/or vomiting subsides. Call your physician if vomiting continues.  Special Instructions/Symptoms: Your throat may feel dry or sore from the anesthesia or the breathing tube placed in your throat during surgery. If this causes discomfort, gargle with warm salt water. The discomfort should disappear within 24 hours.  Call your surgeon if you experience:   1.  Fever over 101.0. 2.  Inability to urinate. 3.  Nausea and/or vomiting. 4.  Extreme swelling or bruising at the surgical site. 5.  Continued bleeding from the incision. 6.  Increased pain, redness or drainage from the incision. 7.  Problems related to your pain medication. 8.  Any problems and/or concerns  May have Tylenol at 3pm May have Ibuprofen at 6:30pm  May take oxycodone again at 5:30pm

## 2019-06-13 NOTE — Op Note (Signed)
Carmen Daniels 06/13/2019  PREOPERATIVE DIAGNOSIS:  Undesired fertility  POSTOPERATIVE DIAGNOSIS:  Undesired fertility  PROCEDURE:  Laparoscopic Bilateral Tubal Sterilization using Flische clips   ANESTHESIA:  General endotracheal  SURGEON: Anessia Oakland L. Rip Harbour, MD  COMPLICATIONS:  None immediate.  ESTIMATED BLOOD LOSS:  Less than 20 ml.  FLUIDS: As recorded  URINE OUTPUT:  As recorded  INDICATIONS: 34 y.o. No obstetric history on file.  with undesired fertility, desires permanent sterilization. Other reversible forms of contraception were discussed with patient; she declines all other modalities.  Risks of procedure discussed with patient including permanence of method, bleeding, infection, injury to surrounding organs and need for additional procedures including laparotomy, risk of regret.  Failure risk of 0.5-1% with increased risk of ectopic gestation if pregnancy occurs was also discussed with patient.      FINDINGS:  Normal uterus, tubes, and ovaries.  TECHNIQUE:  The patient was taken to the operating room where general anesthesia was obtained without difficulty.  She was then placed in the dorsal lithotomy position and prepared and draped in sterile fashion.  After an adequate timeout was performed, a bivalved speculum was then placed in the patient's vagina, and the anterior lip of cervix grasped with the single-tooth tenaculum.  The uterine manipulator was then advanced into the uterus.  The speculum was removed from the vagina.  Attention was then turned to the patient's abdomen where a 11-mm skin incision was made in the umbilical fold. The fascia was identified and grasped and cut. Fascial edges were secured with 2/0 Vicryl. A Hassan trocar was then easily placed and secured.   The abdomen was then insufflated with carbon dioxide gas and adequate pneumoperitoneum was obtained.  A survey of the patient's pelvis and abdomen revealed entirely normal anatomy.   The fallopian tubes were  observed and found to be normal in appearance. Flische clips were then placed bilaterally on each tube. The instruments were then removed from the patient's abdomen and the fascial incision was closed and the skin was closed with 4/0 Monocryl and Dermabond. 30 cc of 0.5% Marcaine was injected at the incision site.  Dressing was applied.  The uterine manipulator and the tenaculum were removed from the vagina without complications. The patient tolerated the procedure well.  Sponge, lap, and needle counts were correct times two.  The patient was then taken to the recovery room awake, extubated and in stable condition.

## 2019-06-13 NOTE — Anesthesia Procedure Notes (Signed)
Procedure Name: Intubation Date/Time: 06/13/2019 9:57 AM Performed by: Raenette Rover, CRNA Pre-anesthesia Checklist: Patient identified, Emergency Drugs available, Suction available and Patient being monitored Patient Re-evaluated:Patient Re-evaluated prior to induction Oxygen Delivery Method: Circle system utilized Preoxygenation: Pre-oxygenation with 100% oxygen Induction Type: IV induction Ventilation: Mask ventilation without difficulty Laryngoscope Size: Mac and 3 Grade View: Grade I Tube type: Oral Tube size: 7.0 mm Number of attempts: 1 Airway Equipment and Method: Stylet Placement Confirmation: ETT inserted through vocal cords under direct vision,  positive ETCO2 and breath sounds checked- equal and bilateral Secured at: 21 cm Tube secured with: Tape Dental Injury: Teeth and Oropharynx as per pre-operative assessment

## 2019-06-13 NOTE — Anesthesia Postprocedure Evaluation (Signed)
Anesthesia Post Note  Patient: CAMILLA HENDRIXSON  Procedure(s) Performed: LAPAROSCOPIC TUBAL LIGATION (Bilateral Vagina )     Patient location during evaluation: PACU Anesthesia Type: General Level of consciousness: awake and alert Pain management: pain level controlled Vital Signs Assessment: post-procedure vital signs reviewed and stable Respiratory status: spontaneous breathing, nonlabored ventilation, respiratory function stable and patient connected to nasal cannula oxygen Cardiovascular status: blood pressure returned to baseline and stable Postop Assessment: no apparent nausea or vomiting Anesthetic complications: no    Last Vitals:  Vitals:   06/13/19 1115 06/13/19 1150  BP: 112/78 132/80  Pulse: 99 99  Resp: 19 18  Temp:  36.7 C  SpO2: 100% 100%    Last Pain:  Vitals:   06/13/19 1150  TempSrc: Oral  PainSc: 8                  Shylah Dossantos

## 2019-06-13 NOTE — Interval H&P Note (Signed)
History and Physical Interval Note:  06/13/2019 9:54 AM  Carmen Daniels  has presented today for surgery, with the diagnosis of Undesired Fertility.  The various methods of treatment have been discussed with the patient and family. After consideration of risks, benefits and other options for treatment, the patient has consented to  Procedure(s): LAPAROSCOPIC TUBAL LIGATION (Bilateral) as a surgical intervention.  The patient's history has been reviewed, patient examined, no change in status, stable for surgery.  I have reviewed the patient's chart and labs.  Questions were answered to the patient's satisfaction.     Chancy Milroy

## 2019-06-14 ENCOUNTER — Encounter: Payer: Self-pay | Admitting: *Deleted

## 2019-06-20 ENCOUNTER — Emergency Department (HOSPITAL_COMMUNITY)
Admission: EM | Admit: 2019-06-20 | Discharge: 2019-06-20 | Disposition: A | Discharge status: Home / Self Care | Payer: Medicaid Other | Attending: Emergency Medicine | Admitting: Emergency Medicine

## 2019-06-20 ENCOUNTER — Ambulatory Visit: Payer: Self-pay | Admitting: Psychiatry

## 2019-06-20 ENCOUNTER — Encounter (HOSPITAL_COMMUNITY): Payer: Self-pay | Admitting: *Deleted

## 2019-06-20 DIAGNOSIS — Z5321 Procedure and treatment not carried out due to patient leaving prior to being seen by health care provider: Secondary | ICD-10-CM | POA: Diagnosis not present

## 2019-06-20 DIAGNOSIS — R109 Unspecified abdominal pain: Secondary | ICD-10-CM | POA: Diagnosis present

## 2019-06-20 LAB — URINALYSIS, ROUTINE W REFLEX MICROSCOPIC
Bilirubin Urine: NEGATIVE
Glucose, UA: NEGATIVE mg/dL
Hgb urine dipstick: NEGATIVE
Ketones, ur: NEGATIVE mg/dL
Leukocytes,Ua: NEGATIVE
Nitrite: NEGATIVE
Protein, ur: NEGATIVE mg/dL
Specific Gravity, Urine: 1.019 (ref 1.005–1.030)
pH: 7 (ref 5.0–8.0)

## 2019-06-20 LAB — COMPREHENSIVE METABOLIC PANEL
ALT: 42 U/L (ref 0–44)
AST: 32 U/L (ref 15–41)
Albumin: 4.2 g/dL (ref 3.5–5.0)
Alkaline Phosphatase: 63 U/L (ref 38–126)
Anion gap: 11 (ref 5–15)
BUN: 20 mg/dL (ref 6–20)
CO2: 23 mmol/L (ref 22–32)
Calcium: 9.9 mg/dL (ref 8.9–10.3)
Chloride: 101 mmol/L (ref 98–111)
Creatinine, Ser: 0.73 mg/dL (ref 0.44–1.00)
GFR calc Af Amer: >60 mL/min (ref >60)
GFR calc non Af Amer: >60 mL/min (ref >60)
Glucose, Bld: 94 mg/dL (ref 70–99)
Potassium: 4.3 mmol/L (ref 3.5–5.1)
Sodium: 135 mmol/L (ref 135–145)
Total Bilirubin: 1.2 mg/dL (ref 0.3–1.2)
Total Protein: 7.6 g/dL (ref 6.5–8.1)

## 2019-06-20 LAB — CBC
HCT: 42.5 % (ref 36.0–46.0)
Hemoglobin: 14.3 g/dL (ref 12.0–15.0)
MCH: 30.4 pg (ref 26.0–34.0)
MCHC: 33.6 g/dL (ref 30.0–36.0)
MCV: 90.4 fL (ref 80.0–100.0)
Platelets: 312 10*3/uL (ref 150–400)
RBC: 4.7 MIL/uL (ref 3.87–5.11)
RDW: 12.4 % (ref 11.5–15.5)
WBC: 7.4 10*3/uL (ref 4.0–10.5)
nRBC: 0 % (ref 0.0–0.2)

## 2019-06-20 LAB — I-STAT BETA HCG BLOOD, ED (MC, WL, AP ONLY): I-stat hCG, quantitative: <5 m[IU]/mL (ref <5)

## 2019-06-20 MED ORDER — SODIUM CHLORIDE 0.9% FLUSH: 3.0000 mL | Freq: Once | INTRAVENOUS | Status: DC

## 2019-06-20 NOTE — ED Notes (Signed)
Called pt x 3 no answer 

## 2019-06-20 NOTE — ED Triage Notes (Signed)
To ED for eval of abd pain at surgical site - pt tubes tied a week ago. States she started have stabbing pain this am. States she had some cramping yesterday. Dressing intact still. No swelling noted to site. Nausea due to pain - no vomiting. Pt states she has had BMs since since surgery. States she has hesitancy with urination. Appears in nad. Pt moves slowly due to pain.

## 2019-06-28 ENCOUNTER — Other Ambulatory Visit: Payer: Self-pay | Admitting: Neurology

## 2019-07-03 ENCOUNTER — Telehealth: Payer: Self-pay

## 2019-07-03 NOTE — Telephone Encounter (Signed)
Pt called and reports that she had a BTL on 06/13/19. Pt reports that beginning this morning she has noticed bleeding coming from her incision site and pain. Pt reports the bleeding has gotten worse. I advised pt go be evaluated at the hospital, pt voices understanding.

## 2019-07-10 ENCOUNTER — Encounter: Payer: Medicaid Other | Admitting: Obstetrics and Gynecology

## 2019-07-19 ENCOUNTER — Other Ambulatory Visit: Payer: Self-pay

## 2019-07-19 ENCOUNTER — Ambulatory Visit (HOSPITAL_BASED_OUTPATIENT_CLINIC_OR_DEPARTMENT_OTHER): Payer: Medicaid Other | Admitting: Obstetrics and Gynecology

## 2019-07-19 ENCOUNTER — Encounter: Payer: Self-pay | Admitting: Obstetrics and Gynecology

## 2019-07-19 ENCOUNTER — Ambulatory Visit: Payer: Self-pay | Admitting: Psychiatry

## 2019-07-19 VITALS — BP 135/82 | HR 98 | Wt 216.0 lb

## 2019-07-19 DIAGNOSIS — Z3009 Encounter for other general counseling and advice on contraception: Secondary | ICD-10-CM

## 2019-07-19 DIAGNOSIS — N920 Excessive and frequent menstruation with regular cycle: Secondary | ICD-10-CM

## 2019-07-19 DIAGNOSIS — Z9889 Other specified postprocedural states: Secondary | ICD-10-CM | POA: Insufficient documentation

## 2019-07-19 DIAGNOSIS — Z9851 Tubal ligation status: Secondary | ICD-10-CM

## 2019-07-19 DIAGNOSIS — Z48816 Encounter for surgical aftercare following surgery on the genitourinary system: Secondary | ICD-10-CM

## 2019-07-19 MED ORDER — TRANEXAMIC ACID 650 MG PO TABS: 1300.0000 mg | ORAL_TABLET | Freq: Three times a day (TID) | ORAL | 11 refills | Status: DC

## 2019-07-19 NOTE — Patient Instructions (Signed)
Health Maintenance, Female Adopting a healthy lifestyle and getting preventive care are important in promoting health and wellness. Ask your health care provider about:  The right schedule for you to have regular tests and exams.  Things you can do on your own to prevent diseases and keep yourself healthy. What should I know about diet, weight, and exercise? Eat a healthy diet   Eat a diet that includes plenty of vegetables, fruits, low-fat dairy products, and lean protein.  Do not eat a lot of foods that are high in solid fats, added sugars, or sodium. Maintain a healthy weight Body mass index (BMI) is used to identify weight problems. It estimates body fat based on height and weight. Your health care provider can help determine your BMI and help you achieve or maintain a healthy weight. Get regular exercise Get regular exercise. This is one of the most important things you can do for your health. Most adults should:  Exercise for at least 150 minutes each week. The exercise should increase your heart rate and make you sweat (moderate-intensity exercise).  Do strengthening exercises at least twice a week. This is in addition to the moderate-intensity exercise.  Spend less time sitting. Even light physical activity can be beneficial. Watch cholesterol and blood lipids Have your blood tested for lipids and cholesterol at 35 years of age, then have this test every 5 years. Have your cholesterol levels checked more often if:  Your lipid or cholesterol levels are high.  You are older than 35 years of age.  You are at high risk for heart disease. What should I know about cancer screening? Depending on your health history and family history, you may need to have cancer screening at various ages. This may include screening for:  Breast cancer.  Cervical cancer.  Colorectal cancer.  Skin cancer.  Lung cancer. What should I know about heart disease, diabetes, and high blood  pressure? Blood pressure and heart disease  High blood pressure causes heart disease and increases the risk of stroke. This is more likely to develop in people who have high blood pressure readings, are of African descent, or are overweight.  Have your blood pressure checked: ? Every 3-5 years if you are 18-39 years of age. ? Every year if you are 40 years old or older. Diabetes Have regular diabetes screenings. This checks your fasting blood sugar level. Have the screening done:  Once every three years after age 40 if you are at a normal weight and have a low risk for diabetes.  More often and at a younger age if you are overweight or have a high risk for diabetes. What should I know about preventing infection? Hepatitis B If you have a higher risk for hepatitis B, you should be screened for this virus. Talk with your health care provider to find out if you are at risk for hepatitis B infection. Hepatitis C Testing is recommended for:  Everyone born from 1945 through 1965.  Anyone with known risk factors for hepatitis C. Sexually transmitted infections (STIs)  Get screened for STIs, including gonorrhea and chlamydia, if: ? You are sexually active and are younger than 35 years of age. ? You are older than 35 years of age and your health care provider tells you that you are at risk for this type of infection. ? Your sexual activity has changed since you were last screened, and you are at increased risk for chlamydia or gonorrhea. Ask your health care provider if   you are at risk.  Ask your health care provider about whether you are at high risk for HIV. Your health care provider may recommend a prescription medicine to help prevent HIV infection. If you choose to take medicine to prevent HIV, you should first get tested for HIV. You should then be tested every 3 months for as long as you are taking the medicine. Pregnancy  If you are about to stop having your period (premenopausal) and  you may become pregnant, seek counseling before you get pregnant.  Take 400 to 800 micrograms (mcg) of folic acid every day if you become pregnant.  Ask for birth control (contraception) if you want to prevent pregnancy. Osteoporosis and menopause Osteoporosis is a disease in which the bones lose minerals and strength with aging. This can result in bone fractures. If you are 65 years old or older, or if you are at risk for osteoporosis and fractures, ask your health care provider if you should:  Be screened for bone loss.  Take a calcium or vitamin D supplement to lower your risk of fractures.  Be given hormone replacement therapy (HRT) to treat symptoms of menopause. Follow these instructions at home: Lifestyle  Do not use any products that contain nicotine or tobacco, such as cigarettes, e-cigarettes, and chewing tobacco. If you need help quitting, ask your health care provider.  Do not use street drugs.  Do not share needles.  Ask your health care provider for help if you need support or information about quitting drugs. Alcohol use  Do not drink alcohol if: ? Your health care provider tells you not to drink. ? You are pregnant, may be pregnant, or are planning to become pregnant.  If you drink alcohol: ? Limit how much you use to 0-1 drink a day. ? Limit intake if you are breastfeeding.  Be aware of how much alcohol is in your drink. In the [place] one drink equals one 12 oz bottle of beer (355 mL), one 5 oz glass of wine (148 mL), or one 1 oz glass of hard liquor (44 mL). General instructions  Schedule regular health, dental, and eye exams.  Stay current with your vaccines.  Tell your health care provider if: ? You often feel depressed. ? You have ever been abused or do not feel safe at home. Summary  Adopting a healthy lifestyle and getting preventive care are important in promoting health and wellness.  Follow your health care provider's instructions about healthy  diet, exercising, and getting tested or screened for diseases.  Follow your health care provider's instructions on monitoring your cholesterol and blood pressure. This information is not intended to replace advice given to you by your health care provider. Make sure you discuss any questions you have with your health care provider. Document Revised: 02/01/2018 Document Reviewed: 02/01/2018 Elsevier Patient Education  2020 Elsevier Inc.  

## 2019-07-19 NOTE — Progress Notes (Signed)
Carmen Daniels presents for post op appt from [facility] 06/13/19. She reports no problems today. Denies bowel or bladder dysfunction Has had a cycle since surgery. TXA is helping with cycles  PE AF VSS Lungs clear Heart RRR Abd soft + BS incision well healed  A/P Post OP visit s/p BTL        Menorrhagia  Return to normal ADL's. Continue with TXA. F/U in 1 yr or PRN

## 2019-07-24 ENCOUNTER — Telehealth: Payer: Self-pay | Admitting: Neurology

## 2019-07-24 NOTE — Telephone Encounter (Signed)
Notes from appt on 06/05/19:  Instructions  Keppra 250 mg  2 tablets twice a day until August 05 2019. Then one tab twice a day from June 14-October 05 2019.  Then stop       After Visit Summary (Printed 06/05/2019)  I have reviewed this information with the patient's mother. She wrote the tapering schedule down and read it back to me correctly.

## 2019-07-24 NOTE — Telephone Encounter (Signed)
Pt mother called to confirm the pts dosage for keppra states they received to medication dosages from pharmacy and wanting to confirm correct dosage.

## 2019-07-30 ENCOUNTER — Emergency Department (HOSPITAL_COMMUNITY)
Admission: EM | Admit: 2019-07-30 | Discharge: 2019-07-31 | Disposition: A | Discharge status: Home / Self Care | Payer: Medicaid Other | Attending: Emergency Medicine | Admitting: Emergency Medicine

## 2019-07-30 ENCOUNTER — Encounter (HOSPITAL_COMMUNITY): Payer: Self-pay

## 2019-07-30 ENCOUNTER — Other Ambulatory Visit: Payer: Self-pay

## 2019-07-30 DIAGNOSIS — R45851 Suicidal ideations: Secondary | ICD-10-CM | POA: Insufficient documentation

## 2019-07-30 DIAGNOSIS — F17210 Nicotine dependence, cigarettes, uncomplicated: Secondary | ICD-10-CM | POA: Diagnosis not present

## 2019-07-30 DIAGNOSIS — J45909 Unspecified asthma, uncomplicated: Secondary | ICD-10-CM | POA: Diagnosis not present

## 2019-07-30 DIAGNOSIS — F603 Borderline personality disorder: Secondary | ICD-10-CM | POA: Diagnosis not present

## 2019-07-30 DIAGNOSIS — F3189 Other bipolar disorder: Secondary | ICD-10-CM | POA: Diagnosis present

## 2019-07-30 DIAGNOSIS — Z79899 Other long term (current) drug therapy: Secondary | ICD-10-CM | POA: Diagnosis not present

## 2019-07-30 DIAGNOSIS — F419 Anxiety disorder, unspecified: Secondary | ICD-10-CM | POA: Diagnosis present

## 2019-07-30 DIAGNOSIS — F319 Bipolar disorder, unspecified: Secondary | ICD-10-CM | POA: Insufficient documentation

## 2019-07-30 DIAGNOSIS — Z881 Allergy status to other antibiotic agents status: Secondary | ICD-10-CM | POA: Insufficient documentation

## 2019-07-30 DIAGNOSIS — F4323 Adjustment disorder with mixed anxiety and depressed mood: Secondary | ICD-10-CM | POA: Insufficient documentation

## 2019-07-30 LAB — LITHIUM LEVEL: Lithium Lvl: <0.06 mmol/L — ABNORMAL LOW (ref 0.60–1.20)

## 2019-07-30 LAB — COMPREHENSIVE METABOLIC PANEL
ALT: 17 U/L (ref 0–44)
AST: 19 U/L (ref 15–41)
Albumin: 4.9 g/dL (ref 3.5–5.0)
Alkaline Phosphatase: 62 U/L (ref 38–126)
Anion gap: 10 (ref 5–15)
BUN: 9 mg/dL (ref 6–20)
CO2: 24 mmol/L (ref 22–32)
Calcium: 9.8 mg/dL (ref 8.9–10.3)
Chloride: 106 mmol/L (ref 98–111)
Creatinine, Ser: 0.79 mg/dL (ref 0.44–1.00)
GFR calc Af Amer: >60 mL/min (ref >60)
GFR calc non Af Amer: >60 mL/min (ref >60)
Glucose, Bld: 88 mg/dL (ref 70–99)
Potassium: 4.1 mmol/L (ref 3.5–5.1)
Sodium: 140 mmol/L (ref 135–145)
Total Bilirubin: 0.9 mg/dL (ref 0.3–1.2)
Total Protein: 8.3 g/dL — ABNORMAL HIGH (ref 6.5–8.1)

## 2019-07-30 LAB — I-STAT BETA HCG BLOOD, ED (MC, WL, AP ONLY): I-stat hCG, quantitative: <5 m[IU]/mL (ref <5)

## 2019-07-30 LAB — CBC
HCT: 44.9 % (ref 36.0–46.0)
Hemoglobin: 15.1 g/dL — ABNORMAL HIGH (ref 12.0–15.0)
MCH: 29.6 pg (ref 26.0–34.0)
MCHC: 33.6 g/dL (ref 30.0–36.0)
MCV: 88 fL (ref 80.0–100.0)
Platelets: 309 10*3/uL (ref 150–400)
RBC: 5.1 MIL/uL (ref 3.87–5.11)
RDW: 11.2 % — ABNORMAL LOW (ref 11.5–15.5)
WBC: 6.9 10*3/uL (ref 4.0–10.5)
nRBC: 0 % (ref 0.0–0.2)

## 2019-07-30 LAB — RAPID URINE DRUG SCREEN, HOSP PERFORMED
Amphetamines: NOT DETECTED
Barbiturates: NOT DETECTED
Benzodiazepines: POSITIVE — AB
Cocaine: NOT DETECTED
Opiates: NOT DETECTED
Tetrahydrocannabinol: NOT DETECTED

## 2019-07-30 LAB — SALICYLATE LEVEL: Salicylate Lvl: <7 mg/dL — ABNORMAL LOW (ref 7.0–30.0)

## 2019-07-30 LAB — ETHANOL: Alcohol, Ethyl (B): <10 mg/dL (ref <10)

## 2019-07-30 LAB — ACETAMINOPHEN LEVEL: Acetaminophen (Tylenol), Serum: <10 ug/mL — ABNORMAL LOW (ref 10–30)

## 2019-07-30 MED ORDER — LORAZEPAM 2 MG/ML IJ SOLN
1.0000 mg | Freq: Once | INTRAMUSCULAR | Status: AC
  Administered 2019-07-30: 1 mg via INTRAVENOUS
  Filled 2019-07-30: qty 1

## 2019-07-30 MED ORDER — NICOTINE 21 MG/24HR TD PT24
21.0000 mg | MEDICATED_PATCH | Freq: Every day | TRANSDERMAL | Status: DC
  Administered 2019-07-30: 21 mg via TRANSDERMAL
  Filled 2019-07-30: qty 1

## 2019-07-30 MED ORDER — ZOLPIDEM TARTRATE 5 MG PO TABS: 10.0000 mg | ORAL_TABLET | Freq: Every evening | ORAL | Status: DC | PRN

## 2019-07-30 MED ORDER — LEVETIRACETAM 500 MG PO TABS
500.0000 mg | ORAL_TABLET | Freq: Two times a day (BID) | ORAL | Status: DC
  Administered 2019-07-30 – 2019-07-31 (×2): 500 mg via ORAL
  Filled 2019-07-30 (×2): qty 1

## 2019-07-30 MED ORDER — DIPHENHYDRAMINE HCL 50 MG/ML IJ SOLN: 25.0000 mg | Freq: Once | INTRAMUSCULAR | Status: DC

## 2019-07-30 MED ORDER — LORAZEPAM 1 MG PO TABS
2.0000 mg | ORAL_TABLET | Freq: Four times a day (QID) | ORAL | Status: DC | PRN
  Administered 2019-07-31: 2 mg via ORAL
  Filled 2019-07-30: qty 2

## 2019-07-30 MED ORDER — OLANZAPINE 10 MG PO TABS
20.0000 mg | ORAL_TABLET | Freq: Every day | ORAL | Status: DC
  Filled 2019-07-30: qty 4

## 2019-07-30 NOTE — ED Notes (Addendum)
Provided pt w/burgandy scrubs to undress and replace personal clothing. Items collected and placed in labeled bag, including dress, coat/hoodie, sandals. Pt's phone and purse given to Mom in ED lobby. Belongings bag currently at triage nursing station. RN advised. [employer]

## 2019-07-30 NOTE — ED Notes (Signed)
Patient refused lab draw. States 'I will have to be sedated." RN made aware.

## 2019-07-30 NOTE — ED Provider Notes (Signed)
[facility] DEPT Provider Note   CSN: [id number] Arrival date & time: 07/30/19  1628     History Chief Complaint  Patient presents with  . Suicidal    Carmen Daniels is a 34 y.o. female with pertinent past medical history of bipolar disorder, borderline personality disorder, anxiety, depression that presents to the emergency department today for suicidal ideation.  Per chart review patient was seen in February of this year for overdose on lithium, Ambien, Flexeril and Wellbutrin she was in status epilepticus and was initially intubated for airway protection.  Patient states that she had some type of trigger today that caused her to have suicidal ideations.  Did not want to discuss what trigger was.  States that she only wants to talk to psychiatrist.  Patient is very anxious and agitated this time.  States that she is not having pain anywhere.  Patient states that she does not have a plan, states that it does not really matter if she does.  Patient states that she almost jumped out of her car and wants to jump out of her skin today due to said trigger.  No HI or hallucinations.  Denies any physical complaints. Has had not tried to hurt herself today, states she has taken her normal medications, did not overdose today.   HPI     Past Medical History:  Diagnosis Date  . Acute respiratory failure with hypoxia ([place])   . Acute rhinosinusitis 07/03/2018  . Allergic rhinitis   . Anxiety   . Asthma   . Back pain   . Bipolar disorder ([place])   . Borderline personality disorder ([place])   . Bupropion overdose   . Chronic pain   . Chronic pain syndrome   . Depression   . Hallucinations   . Migraines   . Nasal polyps   . Nausea   . Neck pain   . OCD (obsessive compulsive disorder)   . OCD (obsessive compulsive disorder)   . Ovarian cyst    left    Patient Active Problem List   Diagnosis Date Noted  . Post-operative state 07/19/2019  . Seizures ([place])  06/08/2019  . Bipolar 1 disorder ([place]) 05/17/2019  . Status epilepticus ([place]) 05/17/2019  . Intentional benzodiazepine overdose ([place]) 03/29/2019  . Menorrhagia 02/28/2019  . Intractable chronic 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 status migrainosus 02/08/2019  . History of elective abortion 12/28/2018  . Unwanted fertility 12/28/2018  . QT prolongation 07/03/2018  . AMS (altered mental status) 07/03/2018  . Serum total bilirubin elevated 07/03/2018  . Syncope 07/02/2018  . Sinus congestion 04/13/2017  . Borderline personality disorder ([place]) 04/11/2017  . Bipolar I disorder, current or most recent episode manic, with psychotic features ([place]) 04/10/2017  . Bipolar disorder ([place]) 04/10/2017  . Abnormal MRI of head 04/21/2016  . Chronic migraine without aura 03/27/2015  . Mild persistent asthma 12/30/2014  . Allergic rhinitis due to pollen 12/30/2014  . 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 Nasal polyposis 12/30/2014    Past Surgical History:  Procedure Laterality Date  . LAPAROSCOPIC TUBAL LIGATION Bilateral 06/13/2019   Procedure: LAPAROSCOPIC TUBAL LIGATION;  Surgeon: Chancy Milroy, MD;  Location: [facility];  Service: Gynecology;  Laterality: Bilateral;  FILSHIE CLIPS  . NASAL SINUS SURGERY    . WISDOM TOOTH EXTRACTION       OB History   No obstetric history on file.     Family History  Problem Relation Age of Onset  . Healthy  Father   . Healthy Mother   . Arthritis Maternal Grandmother   . Arthritis Maternal Grandfather   . Depression Paternal Grandmother     Social History   Tobacco Use  . Smoking status: Current Every Day Smoker    Packs/day: 0.50    Types: Cigarettes  . Smokeless tobacco: Never Used  Substance Use Topics  . Alcohol use: Never    Alcohol/week: 0.0 standard drinks  . Drug use: No    Home Medications Prior to Admission medications   Medication Sig Start Date End Date Taking? Authorizing Provider  albuterol (VENTOLIN HFA)  108 (90 Base) MCG/ACT inhaler Inhale 1-2 puffs into the lungs every 4 (four) hours as needed for wheezing or shortness of breath. 04/15/17   Money, Lowry Ram, FNP  beclomethasone (QVAR) 40 MCG/ACT inhaler Inhale 1 puff into the lungs 2 (two) times daily as needed (asthma).     [provider]  botulinum toxin Type A (BOTOX) 100 units SOLR injection Inject 200 Units into the muscle every 3 (three) months. Inject into head and neck muscles 04/30/19   Marcial Pacas, MD  Fremanezumab-vfrm (AJOVY) 225 MG/1.5ML SOAJ Inject 225 mg into the skin every 30 (thirty) days. 03/19/19   Marcial Pacas, MD  ibuprofen (ADVIL) 800 MG tablet Take 1 tablet (800 mg total) by mouth every 8 (eight) hours as needed. 06/13/19   Chancy Milroy, MD  levETIRAcetam (KEPPRA) 250 MG tablet Take 2 tablets (500 mg total) by mouth 2 (two) times daily. 06/05/19   Marcial Pacas, MD  lithium 300 MG tablet Take 300 mg by mouth in the morning and at bedtime.    [provider]  LORazepam (ATIVAN) 2 MG tablet Take 2 mg by mouth every 6 (six) hours as needed for anxiety.    [provider]  OLANZapine (ZYPREXA) 20 MG tablet Take 20 mg by mouth at bedtime.    [provider]  SUMAtriptan (IMITREX) 100 MG tablet TAKE 1 TABLET AT ONSET MAY REPEAT IN 2 HOURS IF NEEDED MAY DOSE 2 PER DAY OR 3 06/28/19   Marcial Pacas, MD  tranexamic acid (LYSTEDA) 650 MG TABS tablet Take 2 tablets (1,300 mg total) by mouth 3 (three) times daily. 07/19/19   Chancy Milroy, MD  zolpidem (AMBIEN) 5 MG tablet Take 10 mg by mouth at bedtime as needed for sleep.    [provider]    Allergies    Keflex [cephalexin] and Flagyl [metronidazole]  Review of Systems   Review of Systems  Constitutional: Negative for chills, diaphoresis, fatigue and fever.  HENT: Negative for congestion, sore throat and trouble swallowing.   Eyes: Negative for pain and visual disturbance.  Respiratory: Negative for cough, shortness of breath and wheezing.     Cardiovascular: Negative for chest pain, palpitations and leg swelling.  Gastrointestinal: Negative for abdominal distention, abdominal pain, diarrhea, nausea and vomiting.  Genitourinary: Negative for difficulty urinating.  Musculoskeletal: Negative for back pain, neck pain and neck stiffness.  Skin: Negative for pallor.  Neurological: Negative for dizziness, speech difficulty, weakness and headaches.  Psychiatric/Behavioral: Positive for agitation, behavioral problems, self-injury and suicidal ideas. Negative for confusion, decreased concentration, dysphoric mood, hallucinations and sleep disturbance. The patient is nervous/anxious. The patient is not hyperactive.     Physical Exam Updated Vital Signs BP 124/81 (BP Location: Left Arm)   Pulse (!) 58   Temp 99.1 F (37.3 C) (Oral)   Resp (!) 91   Ht 5\' 11"  (1.803 m)  Wt 98 kg   LMP 07/30/2019   SpO2 100%   BMI 30.13 kg/m   Physical Exam Constitutional:      General: She is not in acute distress.    Appearance: Normal appearance. She is not ill-appearing, toxic-appearing or diaphoretic.  HENT:     Mouth/Throat:     Mouth: Mucous membranes are moist.     Pharynx: Oropharynx is clear.  Eyes:     General: No scleral icterus.    Extraocular Movements: Extraocular movements intact.     Pupils: Pupils are equal, round, and reactive to light.  Cardiovascular:     Rate and Rhythm: Normal rate and regular rhythm.     Pulses: Normal pulses.     Heart sounds: Normal heart sounds.  Pulmonary:     Effort: Pulmonary effort is normal. No respiratory distress.     Breath sounds: Normal breath sounds. No stridor. No wheezing, rhonchi or rales.  Chest:     Chest wall: No tenderness.  Abdominal:     General: Abdomen is flat. There is no distension.     Palpations: Abdomen is soft.     Tenderness: There is no abdominal tenderness. There is no guarding or rebound.  Musculoskeletal:        General: No swelling or tenderness. Normal  range of motion.     Cervical back: Normal range of motion and neck supple. No rigidity.  Skin:    General: Skin is warm and dry.     Capillary Refill: Capillary refill takes less than 2 seconds.     Coloration: Skin is not pale.  Neurological:     General: No focal deficit present.     Mental Status: She is alert and oriented to person, place, and time. Mental status is at baseline.     Gait: Gait normal.  Psychiatric:        Mood and Affect: Mood normal.        Behavior: Behavior normal.     ED Results / Procedures / Treatments   Labs (all labs ordered are listed, but only abnormal results are displayed) Labs Reviewed  RAPID URINE DRUG SCREEN, HOSP PERFORMED - Abnormal; Notable for the following components:      Result Value   Benzodiazepines POSITIVE (*)    All other components within normal limits  COMPREHENSIVE METABOLIC PANEL  ETHANOL  SALICYLATE LEVEL  ACETAMINOPHEN LEVEL  CBC  LITHIUM LEVEL  I-STAT BETA HCG BLOOD, ED ([facility], [facility], [facility] ONLY)    EKG None  Radiology No results found.  Procedures Procedures (including critical care time)  Medications Ordered in ED Medications  LORazepam (ATIVAN) injection 1 mg (1 mg Intravenous Given 07/30/19 1822)    ED Course  I have reviewed the triage vital signs and the nursing notes.  Pertinent labs & imaging results that were available during my care of the patient were reviewed by me and considered in my medical decision making (see chart for details).    MDM Rules/Calculators/A&P                     Carmen Daniels is a 34 y.o. female with pertinent past medical history of bipolar disorder, borderline personality disorder, anxiety, depression that presents to the emergency department today for suicidal ideation.  Patient does not want blood drawn until her agitation is under control.  Patient states that she does not want to be stuck with needles and just wants to talk to psychiatrist at this  time.  We will give Benadryl and  Ativan at this time and then discuss blood.  Patient without any physical complaints.  Will IVC at this time since patient is threat to herself.  Pt care was handed off to Dr. Darl Householder at 6:23.  Complete history and physical and current plan have been communicated.  Please refer to their note for the remainder of ED care and ultimate disposition. Awaiting blood work.Not medically cleared yet.   Final Clinical Impression(s) / ED Diagnoses Final diagnoses:  Suicidal ideation    Rx / DC Orders ED Discharge Orders    None       Alfredia Client, PA-C 07/30/19 1825    Drenda Freeze, MD 07/30/19 2107

## 2019-07-30 NOTE — ED Notes (Signed)
Safety sitter outside pt room at this time for monitoring

## 2019-07-30 NOTE — BH Assessment (Addendum)
Tele Assessment Note   Patient Name: Carmen Daniels MRN: [id number] Referring Physician: Alfredia Client, PA-C Location of Patient: [facility] Location of Provider: [place] is an 34 y.o. female presenting with SI with no plan. Patient reported onset of SI was earlier today when she became upset, then patients mother called 97. Patient shared that she was no longer suicidal. Patient was not forthcoming with details from incident, stating "its private". Patient refused collateral contact for additional information. Patient reported 1x past suicide attempt in 03/2019 where she was placed inpatient. Patient refused to tell how she attempted suicide. Per chart review patient was seen in February of this year for overdose on lithium, Ambien, Flexeril and Wellbutrin she was in status epilepticus and was initially intubated for airway protection. Patient denied self-harming behaviors. When asked about sleep and appetite, patient stated "it varies", with prompts of additional information the patient refused to give additional details. Patient reported anxiety since childhood. Patient was not forthcoming with additional information.   Patient is currently seeing Dr. Stanton Kidney at [facility] for medication management. Patient is also receiving outpatient therapy from [facility]. Patient denied having access to guns.   Diagnosis: Bipolar  Past Medical History:  Past Medical History:  Diagnosis Date  . Acute respiratory failure with hypoxia ([place])   . Acute rhinosinusitis 07/03/2018  . Allergic rhinitis   . Anxiety   . Asthma   . Back pain   . Bipolar disorder ([place])   . Borderline personality disorder ([place])   . Bupropion overdose   . Chronic pain   . Chronic pain syndrome   . Depression   . Hallucinations   . Migraines   . Nasal polyps   . Nausea   . Neck pain   . OCD (obsessive compulsive disorder)   . OCD (obsessive compulsive disorder)   .  Ovarian cyst    left    Past Surgical History:  Procedure Laterality Date  . LAPAROSCOPIC TUBAL LIGATION Bilateral 06/13/2019   Procedure: LAPAROSCOPIC TUBAL LIGATION;  Surgeon: Chancy Milroy, MD;  Location: [place];  Service: Gynecology;  Laterality: Bilateral;  FILSHIE CLIPS  . NASAL SINUS SURGERY    . WISDOM TOOTH EXTRACTION      Family History:  Family History  Problem Relation Age of Onset  . Healthy Father   . Healthy Mother   . Arthritis Maternal Grandmother   . Arthritis Maternal Grandfather   . Depression Paternal Grandmother     Social History:  reports that she has been smoking cigarettes. She has been smoking about 0.50 packs per day. She has never used smokeless tobacco. She reports that she does not drink alcohol or use drugs.  Additional Social History:  Alcohol / Drug Use Pain Medications: see MAR Prescriptions: see MAR Over the Counter: see MAR  CIWA: CIWA-Ar BP: 114/63 Pulse Rate: (!) 54 COWS:    Allergies:  Allergies  Allergen Reactions  . Keflex [Cephalexin] Anaphylaxis  . Flagyl [Metronidazole] Diarrhea    Home Medications: (Not in a hospital admission)   OB/GYN Status:  Patient's last menstrual period was 07/30/2019.  General Assessment Data Location of Assessment: [facility] TTS Assessment: In system Is this a Tele or Face-to-Face Assessment?: Tele Assessment Is this an Initial Assessment or a Re-assessment for this encounter?: Initial Assessment Patient Accompanied by:: N/A Language Other than English: No Living Arrangements: (alone) What gender do you identify as?: Female Marital status: Single Pregnancy  Status: Unknown Living Arrangements: Alone Can pt return to current living arrangement?: Yes Admission Status: Voluntary Is patient capable of signing voluntary admission?: Yes Referral Source: Self/Family/Friend  Crisis Care Plan Living Arrangements: Alone Legal Guardian: (self) Name of Psychiatrist: (Dr. Stanton Kidney  ? at [facility]) Name of Therapist: ([facility]  Education Status Is patient currently in school?: No Is the patient employed, unemployed or receiving disability?: Unemployed  Risk to self with the past 6 months Suicidal Ideation: Yes-Currently Present Has patient been a risk to self within the past 6 months prior to admission? : Yes Suicidal Intent: No-Not Currently/Within Last 6 Months Has patient had any suicidal intent within the past 6 months prior to admission? : Yes Is patient at risk for suicide?: Yes Suicidal Plan?: No-Not Currently/Within Last 6 Months Has patient had any suicidal plan within the past 6 months prior to admission? : Yes Access to Means: No What has been your use of drugs/alcohol within the last 12 months?: (denied) Previous Attempts/Gestures: Yes How many times?: (1) Other Self Harm Risks: (denied) Triggers for Past Attempts: Unknown Intentional Self Injurious Behavior: None Family Suicide History: No Recent stressful life event(s): (unknown) Persecutory voices/beliefs?: No Depression: Yes Depression Symptoms: (anxiety) Substance abuse history and/or treatment for substance abuse?: No Suicide prevention information given to non-admitted patients: Not applicable  Risk to Others within the past 6 months Homicidal Ideation: No Does patient have any lifetime risk of violence toward others beyond the six months prior to admission? : No Thoughts of Harm to Others: No Current Homicidal Intent: No Current Homicidal Plan: No Access to Homicidal Means: No Identified Victim: (n/a) History of harm to others?: No Assessment of Violence: None Noted Violent Behavior Description: (none reported) Does patient have access to weapons?: No Criminal Charges Pending?: No Does patient have a court date: No Is patient on probation?: No  Psychosis Hallucinations: None noted Delusions: None noted  Mental Status Report Appearance/Hygiene:  Unremarkable Eye Contact: Fair Motor Activity: Freedom of movement Speech: Logical/coherent Level of Consciousness: Alert Mood: Anxious Affect: Anxious, Appropriate to circumstance Anxiety Level: Moderate Thought Processes: Coherent, Relevant Judgement: Partial Orientation: Person, Place, Time, Situation Obsessive Compulsive Thoughts/Behaviors: None  Cognitive Functioning Concentration: Fair Memory: Unable to Assess Is patient IDD: No Insight: Unable to Assess Impulse Control: Unable to Assess Appetite: ("varies") Have you had any weight changes? : (UTA) Sleep: ("varies") Total Hours of Sleep: ("varies") Vegetative Symptoms: Unable to Assess  ADLScreening [facility] Assessment Services) Patient's cognitive ability adequate to safely complete daily activities?: Yes Patient able to express need for assistance with ADLs?: Yes Independently performs ADLs?: Yes (appropriate for developmental age)  Prior Inpatient Therapy Prior Inpatient Therapy: Yes Prior Therapy Dates: (03/2019) Prior Therapy Facilty/Provider(s): ([facility] ) Reason for Treatment: (suicide attempt)  Prior Outpatient Therapy Prior Outpatient Therapy: Yes Prior Therapy Dates: (present) Prior Therapy Facilty/Provider(s): (Dr. Stanton Kidney ? at [facility]) Reason for Treatment: (mental illness) Does patient have an ACCT team?: No Does patient have Intensive In-House Services?  : No Does patient have [facility] services? : No Does patient have P4CC services?: No  ADL Screening (condition at time of admission) Patient's cognitive ability adequate to safely complete daily activities?: Yes Patient able to express need for assistance with ADLs?: Yes Independently performs ADLs?: Yes (appropriate for developmental age)  Advance Directives (For Healthcare) Does Patient Have a Medical Advance Directive?: No Would patient like information on creating a medical advance directive?: No - Patient declined   Disposition:   Disposition Initial  Assessment Completed for this Encounter: Yes  Lindon Romp, NP, recommends overnight observation for safety and stabilization with psych reassessment in the AM.   This service was provided via telemedicine using a 2-way, interactive audio and video technology.  Names of all persons participating in this telemedicine service and their role in this encounter. Name: Deashia Soule Role: Patient  Name: Kirtland Bouchard Role: TTS Clinician  Name: * Role:   Name:  Role:     Venora Maples 07/30/2019 10:09 PM

## 2019-07-30 NOTE — ED Triage Notes (Signed)
Patient states she is suicidal. When asked about a plan, patient stated, "It does not matter." Patient denies any HI, visual or auditory hallucinations. Patient denies any alcohol or drug use.

## 2019-07-30 NOTE — ED Notes (Signed)
Pt refused her dose of olanzapine. Pt states that she has not taken this medication in months and was switched to another med, pt states she is not sure of the pronunciation. Pt states new  medication has been causing muscle spasms but she has not been able to to make her psychiatrist aware of this side effect yet.

## 2019-07-30 NOTE — ED Notes (Signed)
Labeled pt belongings bag placed at nursing station cabinets for ED rooms 9-12. [employer]

## 2019-07-30 NOTE — ED Notes (Signed)
Patient's personal belongings, Phone, purse given to the patient's mother

## 2019-07-30 NOTE — ED Notes (Signed)
TTS machine at bedside. 

## 2019-07-30 NOTE — ED Notes (Signed)
Assessment complete.  Lindon Romp, NP, recommends overnight observation for safety and stabilization with psych reassessment in the AM.

## 2019-07-31 ENCOUNTER — Encounter (HOSPITAL_COMMUNITY): Payer: Self-pay | Admitting: Registered Nurse

## 2019-07-31 DIAGNOSIS — R45851 Suicidal ideations: Secondary | ICD-10-CM | POA: Insufficient documentation

## 2019-07-31 DIAGNOSIS — F4323 Adjustment disorder with mixed anxiety and depressed mood: Secondary | ICD-10-CM | POA: Diagnosis present

## 2019-07-31 DIAGNOSIS — F603 Borderline personality disorder: Secondary | ICD-10-CM

## 2019-07-31 MED ORDER — SUMATRIPTAN SUCCINATE 50 MG PO TABS
100.0000 mg | ORAL_TABLET | ORAL | Status: DC | PRN
  Administered 2019-07-31: 100 mg via ORAL
  Filled 2019-07-31 (×2): qty 2

## 2019-07-31 MED ORDER — OLANZAPINE 20 MG PO TABS: 20.0000 mg | ORAL_TABLET | Freq: Every day | ORAL | 0 refills | Status: DC

## 2019-07-31 NOTE — Consult Note (Signed)
[facility] Psych ED Discharge  07/31/2019 3:02 PM Carmen Daniels  MRN:  [id number] Principal Problem: Adjustment disorder with mixed anxiety and depressed mood Discharge Diagnoses: Principal Problem:   Adjustment disorder with mixed anxiety and depressed mood Active Problems:   Bipolar disorder ([place])   Borderline personality disorder ([place])   Subjective: "I had and argument with my ex-husband; it's really personal, just between me and him." Carmen Daniels, 34 y.o., female patient seen via tele psych by this provider, Dr. Dwyane Dee; and chart reviewed on 07/31/19.  On evaluation Carmen Daniels reports after an argument with her ex-husband she was feeling bad and made statements about wanting to kill herself.  States that she is feeling better since she has calmed down and that it was a statement that she did not want to kill herself.  States that she has outpatient psychiatric services with [facility].  States that she and her therapist have a good relationship.  "If I was able to talk it out yesterday none of this would have happened.  I had just had an appointment with therapist before this happened; but if would have talked things out none of this would have happened."  Patient states that she lives next door to her parents who are supportive.  Gave permission to speak to her mother for collateral information During evaluation Carmen Daniels is alert/oriented x 4; calm/cooperative; and mood is congruent with affect.  She does not appear to be responding to internal/external stimuli or delusional thoughts.  Patient denies suicidal/self-harm/homicidal ideation, psychosis, and paranoia.  Patient answered question appropriately.    Collateral Information:  Spoke to patients mother Carmen Daniels at [phone]).  Patient mother states that she would like to visit patient to assure that patient is feeling better and is in a state to come home.  States she is on her way to the hospital  now. Patients mother reported that patient is safe to come home and that she and patient would like referrals to psychiatric services for medication management don't want to go to [facility].  Informed to stay with current until appointment with new psychiatric provider given.   Total Time spent with patient: 45 minutes  Past Psychiatric History: See above  Past Medical History:  Past Medical History:  Diagnosis Date  . Acute respiratory failure with hypoxia ([place])   . Acute rhinosinusitis 07/03/2018  . Allergic rhinitis   . Anxiety   . Asthma   . Back pain   . Bipolar disorder ([place])   . Borderline personality disorder ([place])   . Bupropion overdose   . Chronic pain   . Chronic pain syndrome   . Depression   . Hallucinations   . Migraines   . Nasal polyps   . Nausea   . Neck pain   . OCD (obsessive compulsive disorder)   . OCD (obsessive compulsive disorder)   . Ovarian cyst    left    Past Surgical History:  Procedure Laterality Date  . LAPAROSCOPIC TUBAL LIGATION Bilateral 06/13/2019   Procedure: LAPAROSCOPIC TUBAL LIGATION;  Surgeon: Chancy Milroy, MD;  Location: [place];  Service: Gynecology;  Laterality: Bilateral;  FILSHIE CLIPS  . NASAL SINUS SURGERY    . WISDOM TOOTH EXTRACTION     Family History:  Family History  Problem Relation Age of Onset  . Healthy Father   . Healthy Mother   . Arthritis Maternal Grandmother   . Arthritis Maternal Grandfather   .  Depression Paternal Grandmother    Family Psychiatric  History: See above Social History:  Social History   Substance and Sexual Activity  Alcohol Use Never  . Alcohol/week: 0.0 standard drinks     Social History   Substance and Sexual Activity  Drug Use No    Social History   Socioeconomic History  . Marital status: Married    Spouse name: Not on file  . Number of children: 0  . Years of education: 8  . Highest education level: Not on file  Occupational History  .  Occupation: Unemployed  Tobacco Use  . Smoking status: Current Every Day Smoker    Packs/day: 0.50    Types: Cigarettes  . Smokeless tobacco: Never Used  Substance and Sexual Activity  . Alcohol use: Never    Alcohol/week: 0.0 standard drinks  . Drug use: No  . Sexual activity: Yes    Birth control/protection: Condom  Other Topics Concern  . Not on file  Social History Narrative   Lives at home alone.   Right-handed.   3 glasses of green tea per day.   Social Determinants of Health   Financial Resource Strain:   . Difficulty of Paying Living Expenses:   Food Insecurity:   . Worried About Charity fundraiser in the Last Year:   . Arboriculturist in the Last Year:   Transportation Needs:   . Film/video editor (Medical):   Marland Kitchen Lack of Transportation (Non-Medical):   Physical Activity:   . Days of Exercise per Week:   . Minutes of Exercise per Session:   Stress:   . Feeling of Stress :   Social Connections:   . Frequency of Communication with Friends and Family:   . Frequency of Social Gatherings with Friends and Family:   . Attends Religious Services:   . Active Member of Clubs or Organizations:   . Attends Archivist Meetings:   Marland Kitchen Marital Status:     Has this patient used any form of tobacco in the last 30 days? (Cigarettes, Smokeless Tobacco, Cigars, and/or Pipes) A prescription for an FDA-approved tobacco cessation medication was offered at discharge and the patient refused  Current Medications: Current Facility-Administered Medications  Medication Dose Route Frequency Provider Last Rate Last Admin  . levETIRAcetam (KEPPRA) tablet 500 mg  500 mg Oral BID Drenda Freeze, MD   500 mg at 07/31/19 0935  . LORazepam (ATIVAN) tablet 2 mg  2 mg Oral Q6H PRN Drenda Freeze, MD   2 mg at 07/31/19 0935  . nicotine (NICODERM CQ - dosed in mg/24 hours) patch 21 mg  21 mg Transdermal Daily Drenda Freeze, MD   21 mg at 07/30/19 2046  . OLANZapine  (ZYPREXA) tablet 20 mg  20 mg Oral QHS Drenda Freeze, MD      . SUMAtriptan [facility]) tablet 100 mg  100 mg Oral Q2H PRN Lacretia Leigh, MD   100 mg at 07/31/19 1048  . zolpidem (AMBIEN) tablet 10 mg  10 mg Oral QHS PRN Drenda Freeze, MD       Current Outpatient Medications  Medication Sig Dispense Refill  . beclomethasone (QVAR) 40 MCG/ACT inhaler Inhale 1 puff into the lungs 2 (two) times daily as needed (asthma).     . botulinum toxin Type A (BOTOX) 100 units SOLR injection Inject 200 Units into the muscle every 3 (three) months. Inject into head and neck muscles 2 each 3  . clonazePAM (  KLONOPIN) 1 MG tablet Take 1 mg by mouth 3 (three) times daily as needed for anxiety.     . diazepam (VALIUM) 5 MG tablet Take 5 mg by mouth daily.     . Fremanezumab-vfrm (AJOVY) 225 MG/1.5ML SOAJ Inject 225 mg into the skin every 30 (thirty) days. 1.5 mL 11  . ibuprofen (ADVIL) 800 MG tablet Take 1 tablet (800 mg total) by mouth every 8 (eight) hours as needed. 30 tablet 0  . levETIRAcetam (KEPPRA) 250 MG tablet Take 2 tablets (500 mg total) by mouth 2 (two) times daily. 120 tablet 5  . SUMAtriptan (IMITREX) 100 MG tablet TAKE 1 TABLET AT ONSET MAY REPEAT IN 2 HOURS IF NEEDED MAY DOSE 2 PER DAY OR 3 (Patient taking differently: Take 100 mg by mouth every 2 (two) hours as needed for migraine. MAY DOSE 2 PER DAY OR 3) 12 tablet 11  . tranexamic acid (LYSTEDA) 650 MG TABS tablet Take 2 tablets (1,300 mg total) by mouth 3 (three) times daily. 30 tablet 11  . zolpidem (AMBIEN) 5 MG tablet Take 10 mg by mouth at bedtime as needed for sleep.    Marland Kitchen OLANZapine (ZYPREXA) 20 MG tablet Take 1 tablet (20 mg total) by mouth at bedtime. 30 tablet 0   PTA Medications: (Not in a hospital admission)   Musculoskeletal: Strength & Muscle Tone: within normal limits Gait & Station: normal Patient leans: N/A  Psychiatric Specialty Exam: Physical Exam  Review of Systems  Blood pressure (!) 120/56, pulse 74,  temperature 98.3 F (36.8 C), temperature source Oral, resp. rate 20, height 5\' 11"  (1.803 m), weight 98 kg, last menstrual period 07/30/2019, SpO2 100 %.Body mass index is 30.13 kg/m.  General Appearance: Casual  Eye Contact:  Good  Speech:  Clear and Coherent and Normal Rate  Volume:  Normal  Mood:  Anxious and Depressed Stable  Affect:  Appropriate and Congruent  Thought Process:  Coherent, Goal Directed and Descriptions of Associations: Intact  Orientation:  Full (Time, Place, and Person)  Thought Content:  WDL  Suicidal Thoughts:  No  Homicidal Thoughts:  No  Memory:  Immediate;   Good Recent;   Good  Judgement:  Intact  Insight:  Present  Psychomotor Activity:  Normal  Concentration:  Concentration: Good and Attention Span: Good  Recall:  Good  Fund of Knowledge:  Good  Language:  Good  Akathisia:  No  Handed:  Right  AIMS (if indicated):     Assets:  Communication Skills Desire for Improvement Housing Resilience Social Support Transportation  ADL's:  Intact  Cognition:  WNL  Sleep:        Demographic Factors:  Caucasian  Loss Factors: NA  Historical Factors: Impulsivity  Risk Reduction Factors:   Sense of responsibility to family, Religious beliefs about death, Living with another person, especially a relative, Positive social support and Positive therapeutic relationship  Continued Clinical Symptoms:  Previous Psychiatric Diagnoses and Treatments  Cognitive Features That Contribute To Risk:  None    Suicide Risk:  Minimal: No identifiable suicidal ideation.  Patients presenting with no risk factors but with morbid ruminations; may be classified as minimal risk based on the severity of the depressive symptoms    Plan Of Care/Follow-up recommendations:  Activity:  As tolerated Diet:  Heart healthy    Discharge Instructions          [facility]      [place], [place]  [address]      [phone]  [phone]    Disposition:  Psychiatrically cleared No evidence of imminent risk to self or others at present.   Patient does not meet criteria for psychiatric inpatient admission. Supportive therapy provided about ongoing stressors. Discussed crisis plan, support from social network, calling 911, coming to the Emergency Department, and calling Suicide Hotline.    Carmen Frees, NP 07/31/2019, 3:02 PM

## 2019-07-31 NOTE — Progress Notes (Addendum)
Patient transferred from room 10 to room 31 after being wanded by security and belongings placed in locker 31.   Called patient's Mom to let her know of her move per patient's request.

## 2019-07-31 NOTE — BH Assessment (Signed)
[facility] Assessment Progress Note  Per Shuvon Rankin, FNP, this pt does not require psychiatric hospitalization at this time.  Pt presents under IVC initiated by EDP Shirlyn Goltz, MD, that has been rescinded by Hampton Abbot, MD.  Pt is to be discharged from [facility] with referrals for [facility]  At 14:55 I called them and spoke to [place].  She has scheduled pt for intake with Gara Kroner, LCSW on Thursday, 08/09/2019 at 13:00 and for psychiatry with Dr. Ronne Binning on Wednesday, 08/22/2019 at 13:00.  This information has been included in pt's discharge instructions.  Pt's nurse, Nena Jordan, has been notified.  Jalene Mullet, [place] Triage Specialist [phone]

## 2019-07-31 NOTE — Discharge Instructions (Signed)
For your behavioral health needs, you are advised to follow up with [facility].  You have an intake appointment scheduled with Gara Kroner, LCSW on Thursday, August 09, 2019 at 1:00 pm.  You also have a psychiatry appointment with Dr Ronne Binning scheduled for Wednesday, August 22, 2019 at 1:00 pm:       [facility]      [place], [place] [address]      [phone]

## 2019-08-09 ENCOUNTER — Ambulatory Visit (HOSPITAL_COMMUNITY): Payer: Medicaid Other | Admitting: Clinical

## 2019-08-15 ENCOUNTER — Ambulatory Visit: Payer: Medicaid Other | Admitting: Neurology

## 2019-08-21 ENCOUNTER — Telehealth: Payer: Self-pay | Admitting: Neurology

## 2019-08-21 NOTE — Telephone Encounter (Signed)
Patient has a Botox appointment on 7/14. She previously used [facility] as her specialty pharmacy, but they no longer fill Botox. Patient has Medicaid. I called [facility] [phone]) to see about enrolling patient in their pharmacy. I spoke with Jody who helped me set up patient's profile. I spoke with Nira Conn in the pharmacy to give them a verbal prescription. Inject 155U into head and neck muscles every 3 months. 2 refills. Heather advised me they would call patient to get consent and then call us after that to schedule delivery. I called the patient to let her know about this change and that the pharmacy would be calling her. I gave her the number to [facility]. She verbalized understanding.

## 2019-08-22 ENCOUNTER — Other Ambulatory Visit: Payer: Self-pay

## 2019-08-22 ENCOUNTER — Ambulatory Visit (INDEPENDENT_AMBULATORY_CARE_PROVIDER_SITE_OTHER): Payer: Medicaid Other | Admitting: Psychiatry

## 2019-08-22 ENCOUNTER — Encounter (HOSPITAL_COMMUNITY): Payer: Self-pay | Admitting: Psychiatry

## 2019-08-22 DIAGNOSIS — F319 Bipolar disorder, unspecified: Secondary | ICD-10-CM | POA: Diagnosis not present

## 2019-08-22 DIAGNOSIS — F4010 Social phobia, unspecified: Secondary | ICD-10-CM

## 2019-08-22 MED ORDER — OXCARBAZEPINE 600 MG PO TABS: 600.0000 mg | ORAL_TABLET | Freq: Two times a day (BID) | ORAL | 1 refills | Status: DC

## 2019-08-22 MED ORDER — ASENAPINE MALEATE 10 MG SL SUBL: 10.0000 mg | SUBLINGUAL_TABLET | Freq: Every evening | SUBLINGUAL | 1 refills | Status: DC

## 2019-08-22 MED ORDER — HYDROXYZINE HCL 10 MG PO TABS: 10.0000 mg | ORAL_TABLET | Freq: Three times a day (TID) | ORAL | 0 refills | Status: DC | PRN

## 2019-08-22 NOTE — Progress Notes (Signed)
Psychiatric Initial Adult Assessment   Patient Identification: Carmen Daniels MRN:  [id number] Date of Evaluation:  08/22/2019 Referral Source: Elvina Sidle Chief Complaint: Per Patient "Im not doing good"         Per mother "Shes anxious"    Visit Diagnosis:    ICD-10-CM   1. Bipolar 1 disorder (HCC)  F31.9 oxcarbazepine (TRILEPTAL) 600 MG tablet    asenapine (SAPHRIS) 5 MG SUBL 24 hr tablet  2. Social anxiety disorder  F40.10 hydrOXYzine (ATARAX/VISTARIL) 10 MG tablet    History of Present Illness:  34 year old female seen today for initial psychiatric evaluation. She was referred to outpatient psychiatry by Elvina Sidle for outpatient follow up. She was seen at the hospital on 07/30/2019 for SI. She has a psychiatric history of bipolar disorder, borderline personality disorder, anxiety, depression, and SI/SA. In February of 2021 patient was admitted for overdosing on lithium, Ambien, Flexeril and Wellbutrin.  She is currently being managed on Zyprexa 20 mg. She notes that she dislikes this medication and would like to stop it due to its ineffectiveness and weight gain side effect.  Today patient endorses symptoms of depression such as psychomotor agitation, feelings of guilt, impaired memory, roblems concentrating, passive SI, anxiety, compulsive behavior (counting/ doing things in threes and checking lights), fatigue, weight gain, and increased appetite.Patient informed Probation officer that she will be visiting a friend in [place] this week and is anxious about interaction in the airport. She notes that she avoids social settings however is trying to step out of her comfort zone.  She also reports that at times she is distractible, has racing thoughts, elevated and elevated moods. She endorse VAH and notes that she sees shadow people and hear negative voices which  tells her that her mother is trying to poison her as well as telling her to kill herself. Today she denies SI/HI and contracts for safety. She  was seen with her mother today. She reports that she has converted her garage into an apartment for her daughter. She also informed Probation officer that she keeps her medications locked away and monitors when she takes them.   Writer recommended patient try Arman Filter however she reported that she tried it in the past and it caused akathisia and spasms. Patient agreeable to starting Trileptal 600 mg BID to help stabilize her mood. She is also agreeable to starting Saphris 10 mg HS to help [facility] and start hydroxyzine 10 mg TID to help manage anxiety. Potential side effects of medication and risks vs benefits of treatment vs non-treatment were explained and discussed. All questions were answered.  She is currently seeing a counselor for therapy and plans to follow up soon. No other concerns noted at this time   Associated Signs/Symptoms: Depression Symptoms:  psychomotor agitation, feelings of worthlessness/guilt, difficulty concentrating, impaired memory, suicidal thoughts without plan, anxiety, panic attacks, loss of energy/fatigue, weight gain, increased appetite, (Hypo) Manic Symptoms:  Distractibility, Elevated Mood, Flight of Ideas, Hallucinations, Anxiety Symptoms:  Excessive Worry, Panic Symptoms, Obsessive Compulsive Symptoms:   Counting,, Psychotic Symptoms:  Hallucinations: Auditory Visual PTSD Symptoms: NA  Past Psychiatric History: bipolar disorder, borderline personality disorder, anxiety, depression, and SI/SA  Previous Psychotropic Medications: Trialed Vrylar (caused restlessness and spasms), zyprexa (disliked weight gain), ambien, lamictal, and wellbutrin  Substance Abuse History in the last 12 months:  No.  Consequences of Substance Abuse: NA  Past Medical History:  Past Medical History:  Diagnosis Date  . Acute respiratory failure with hypoxia ([place])   .  Acute rhinosinusitis 07/03/2018  . Allergic rhinitis   . Anxiety   . Asthma   . Back pain   . Bipolar disorder ([place])    . Borderline personality disorder ([place])   . Bupropion overdose   . Chronic pain   . Chronic pain syndrome   . Depression   . Hallucinations   . Migraines   . Nasal polyps   . Nausea   . Neck pain   . OCD (obsessive compulsive disorder)   . OCD (obsessive compulsive disorder)   . Ovarian cyst    left    Past Surgical History:  Procedure Laterality Date  . LAPAROSCOPIC TUBAL LIGATION Bilateral 06/13/2019   Procedure: LAPAROSCOPIC TUBAL LIGATION;  Surgeon: Chancy Milroy, MD;  Location: [place];  Service: Gynecology;  Laterality: Bilateral;  FILSHIE CLIPS  . NASAL SINUS SURGERY    . WISDOM TOOTH EXTRACTION      Family Psychiatric History: Paternal grandmother depression  Family History:  Family History  Problem Relation Age of Onset  . Healthy Father   . Healthy Mother   . Arthritis Maternal Grandmother   . Arthritis Maternal Grandfather   . Depression Paternal Grandmother     Social History:   Social History   Socioeconomic History  . Marital status: Married    Spouse name: Not on file  . Number of children: 0  . Years of education: 56  . Highest education level: Not on file  Occupational History  . Occupation: Unemployed  Tobacco Use  . Smoking status: Current Every Day Smoker    Packs/day: 0.50    Types: Cigarettes  . Smokeless tobacco: Never Used  Vaping Use  . Vaping Use: Every day  . Substances: Nicotine  Substance and Sexual Activity  . Alcohol use: Never    Alcohol/week: 0.0 standard drinks  . Drug use: No  . Sexual activity: Yes    Birth control/protection: Condom  Other Topics Concern  . Not on file  Social History Narrative   Lives at home alone.   Right-handed.   3 glasses of green tea per day.   Social Determinants of Health   Financial Resource Strain:   . Difficulty of Paying Living Expenses:   Food Insecurity:   . Worried About Charity fundraiser in the Last Year:   . Arboriculturist in the Last Year:    Transportation Needs:   . Film/video editor (Medical):   Marland Kitchen Lack of Transportation (Non-Medical):   Physical Activity:   . Days of Exercise per Week:   . Minutes of Exercise per Session:   Stress:   . Feeling of Stress :   Social Connections:   . Frequency of Communication with Friends and Family:   . Frequency of Social Gatherings with Friends and Family:   . Attends Religious Services:   . Active Member of Clubs or Organizations:   . Attends Archivist Meetings:   Marland Kitchen Marital Status:     Additional Social History: Patient resides with her mother. She is in a relationship however she notes that it is on and off. She is currently unemployed. She denies alcohol, tobacco, or illicit drug use.   Allergies:   Allergies  Allergen Reactions  . Keflex [Cephalexin] Anaphylaxis  . Flagyl [Metronidazole] Diarrhea    Metabolic Disorder Labs: No results found for: HGBA1C, MPG No results found for: PROLACTIN Lab Results  Component Value Date   TRIG 341 (H) 04/03/2019  No results found for: TSH  Therapeutic Level Labs: Lab Results  Component Value Date   LITHIUM <0.06 (L) 07/30/2019   No results found for: CBMZ No results found for: VALPROATE  Current Medications: Current Outpatient Medications  Medication Sig Dispense Refill  . beclomethasone (QVAR) 40 MCG/ACT inhaler Inhale 1 puff into the lungs 2 (two) times daily as needed (asthma).     . botulinum toxin Type A (BOTOX) 100 units SOLR injection Inject 200 Units into the muscle every 3 (three) months. Inject into head and neck muscles 2 each 3  . clonazePAM (KLONOPIN) 1 MG tablet Take 1 mg by mouth 3 (three) times daily as needed for anxiety.     . diazepam (VALIUM) 5 MG tablet Take 5 mg by mouth daily.     . Fremanezumab-vfrm (AJOVY) 225 MG/1.5ML SOAJ Inject 225 mg into the skin every 30 (thirty) days. 1.5 mL 11  . ibuprofen (ADVIL) 800 MG tablet Take 1 tablet (800 mg total) by mouth every 8 (eight) hours as  needed. 30 tablet 0  . levETIRAcetam (KEPPRA) 250 MG tablet Take 2 tablets (500 mg total) by mouth 2 (two) times daily. 120 tablet 5  . OLANZapine (ZYPREXA) 20 MG tablet Take 1 tablet (20 mg total) by mouth at bedtime. 30 tablet 0  . SUMAtriptan (IMITREX) 100 MG tablet TAKE 1 TABLET AT ONSET MAY REPEAT IN 2 HOURS IF NEEDED MAY DOSE 2 PER DAY OR 3 (Patient taking differently: Take 100 mg by mouth every 2 (two) hours as needed for migraine. MAY DOSE 2 PER DAY OR 3) 12 tablet 11  . tranexamic acid (LYSTEDA) 650 MG TABS tablet Take 2 tablets (1,300 mg total) by mouth 3 (three) times daily. 30 tablet 11  . zolpidem (AMBIEN) 5 MG tablet Take 10 mg by mouth at bedtime as needed for sleep.     No current facility-administered medications for this visit.    Musculoskeletal: Strength & Muscle Tone: within normal limits Gait & Station: normal Patient leans: N/A  Psychiatric Specialty Exam: Review of Systems  Last menstrual period 07/30/2019.There is no height or weight on file to calculate BMI.  General Appearance: Well Groomed  Eye Contact:  Good  Speech:  Clear and Coherent and Normal Rate  Volume:  Normal  Mood:  Anxious and Depressed  Affect:  Congruent  Thought Process:  Coherent, Goal Directed and Linear  Orientation:  Full (Time, Place, and Person)  Thought Content:  Hallucinations: Auditory Command:  Telling her to kill herself Visual  Suicidal Thoughts:  Yes.  without intent/plan  Homicidal Thoughts:  No  Memory:  Immediate;   Fair Recent;   Fair Remote;   Fair  Judgement:  Fair  Insight:  Fair  Psychomotor Activity:  Normal  Concentration:  Concentration: Good and Attention Span: Good  Recall:  [place] of Knowledge:Good  Language: Good  Akathisia:  No  Handed:  Right  AIMS (if indicated):  Not done  Assets:  Communication Skills Desire for Improvement Housing Social Support  ADL's:  Intact  Cognition: WNL  Sleep:  Good   Screenings: AIMS     Admission  (Discharged) from 04/10/2017 in [facility]  AIMS Total Score 0    Mini-Mental     Office Visit from 10/05/2016 in [facility]  Total Score (max 30 points ) 26      Assessment and Plan: Patient endorses symptoms of depression, anxiety, hypomania, and psychosis. She is agreeable to  discontinuing Zyprexa due to noted side effects and ineffectiveness. She is also agreeable to starting Saphris  10 mg HS to help symptoms of psychosis, start Trileptal 600 mg BID to help manage mood, and Hydroxyzine to help manage anxiety  1. Bipolar 1 disorder (HCC)  Start- Asenapine Maleate (SAPHRIS) 10 MG SUBL; Place 1 tablet (10 mg total) under the tongue at bedtime.  Dispense: 30 tablet; Refill: 1 Start- oxcarbazepine (TRILEPTAL) 600 MG tablet; Take 1 tablet (600 mg total) by mouth 2 (two) times daily.  Dispense: 60 tablet; Refill: 1  2. Social anxiety disorder  Start- hydrOXYzine (ATARAX/VISTARIL) 10 MG tablet; Take 1 tablet (10 mg total) by mouth 3 (three) times daily as needed.  Dispense: 30 tablet; Refill: 0  Follow up in 1 month.  Follow up with therapy.     Salley Slaughter, NP 6/30/20211:57 PM

## 2019-08-28 NOTE — Telephone Encounter (Signed)
I called [facility] [phone]) and spoke to Celine to check status. She states that [facility]'s system is telling them patient needs to use [facility]. I advised Celine that patient previously used that pharmacy, but they no longer fill Botox for most insurance plans. She states that she was not aware of this, and she states she let the [facility] benefits team know. She states that all they need to do is reach out to [facility] to confirm this.

## 2019-09-03 ENCOUNTER — Other Ambulatory Visit (HOSPITAL_COMMUNITY): Payer: Self-pay | Admitting: Psychiatry

## 2019-09-03 ENCOUNTER — Other Ambulatory Visit: Payer: Self-pay

## 2019-09-03 DIAGNOSIS — N939 Abnormal uterine and vaginal bleeding, unspecified: Secondary | ICD-10-CM

## 2019-09-03 DIAGNOSIS — F4010 Social phobia, unspecified: Secondary | ICD-10-CM

## 2019-09-03 MED ORDER — MEGESTROL ACETATE 20 MG PO TABS: 20.0000 mg | ORAL_TABLET | Freq: Every day | ORAL | 0 refills | Status: DC

## 2019-09-03 NOTE — Progress Notes (Signed)
Rx sent per Dr.Ervin to stop pt irregular bleeding Pt made aware to f/u in 4-6 wks.

## 2019-09-04 NOTE — Telephone Encounter (Signed)
On 7/8, I called Medicaid [id number]) to clarify which specialty pharmacy patient needs to use. I spoke with September who told me that patient has a managed care plan with [facility]. She gave me the number [phone] to call. Reference [id number]. I called [employer] at the number provided and I spoke with Lattie Haw who states there is no record of patient with them. Diane with [facility] called today to schedule delivery of patient's Botox. [facility] confirmed that [facility] can not fill [facility] for member's plan. I told Diane that patient's Botox appointment is tomorrow, and asked if it could be delivered by 1:00. She states that she can not have it delivered by that time. She states Botox will be here on 7/15. Patient can come in for her 7/14 Botox appointment and we will use office stock. We will replace office stock when shipment arrives on 7/15.

## 2019-09-05 ENCOUNTER — Ambulatory Visit: Payer: Medicaid Other | Admitting: Neurology

## 2019-09-05 ENCOUNTER — Encounter: Payer: Self-pay | Admitting: Neurology

## 2019-09-05 VITALS — BP 115/77 | HR 66 | Ht 71.0 in | Wt 222.0 lb

## 2019-09-05 DIAGNOSIS — G43719 Chronic migraine without aura, intractable, without status migrainosus: Secondary | ICD-10-CM | POA: Diagnosis not present

## 2019-09-05 NOTE — Progress Notes (Signed)
**  Botox 100 units x 2 vials, NDC 0023-1145-01, Lot [id number], Exp 11/2021, specialty pharmacy.//mck,rn** 

## 2019-09-05 NOTE — Progress Notes (Signed)
PATIENT: Carmen Daniels DOB: [birth year]  Chief Complaint  Patient presents with  . Migraine    Botox     HISTORICAL  ANDREAH Daniels has of being a patient of our office for many years for chronic migraine headaches, she also has history of bipolar disorder, today she came in with her mother to follow-up her hospital discharge in February 2021 for polypharmacy overdose, status epilepticus  She was admitted to the hospital on March 28, 2019 with intentional overdose, this including Wellbutrin, Ambien, Flexeril, then she was noted to have intermittent whole body shaking, progressed to have seizure-like activity, was intubated on March 29, 2019, was loaded with Versed, Dilantin, she was noted to have 10 to 15 seconds of seizure activity followed by quiescence.  Then she was loaded with IV Keppra, midazolam, followed by propofol drip, which stopped the clinical seizure activity, later continuous EEG showed diffuse continuous slowing, system with severe encephalopathy, she was seen by epileptologist Dr. Hortense Ramal on April 05, 2019, her seizure were likely provoked in the setting of Wellbutrin overdose, most likely she will not require long-term antiemetic medications, continue seizure precaution, gradually wean off Keppra,  Her hospital course was also complicated by aspiration pneumonia, right hand cellulitis, was treated with Levaquin, Flagyl.  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,  Continuous EEG monitoring showed severe diffuse encephalopathy, continuous generalized slowing,  Laboratory evaluation on March 28, 2019 showed positive for benzodiazepine, lithium level was 0.11, plasma lactic acid was 4.1,  Most recent laboratory evaluation in February 2021, CBC showed mild anemia hemoglobin of 11.3, BMP showed  no significant abnormalities  She now complains of short-term memory loss, had no recurrent seizure-like activity, has moved in with her parents, also complains of increased migraine headaches,  She began to receive Botox injection as migraine prevention since March 2017, most recent injection was in December 2020  She previously on polypharmacy for her mood disorder, now only on Zyprexa 50 mg at bedtime  UPDATE June 08 2019: Her EEG was normal on May 31, 2019.  She does not have recurrent seizure, she did have status epilepticus during her hospital admission on March 28, 2019 due to polypharmacy overdose, she is not taking Keppra 750 mg twice a day, I have suggested her for total treatment of 6 months, stepping down to Keppra 500 mg for 2 months, then 250 mg for 2 months, then stop,  She complains of frequent headaches, used up all her monthly Imitrex supply, came in for Botox injection today  UPDATE September 05 2019: She responded well to previous injection, noticed increased headache at the end of the injection cycle  REVIEW OF SYSTEMS: Full 14 system review of systems performed and notable only for as above All other review of systems were negative.  ALLERGIES: Allergies  Allergen Reactions  . Keflex [Cephalexin] Anaphylaxis  . Flagyl [Metronidazole] Diarrhea    HOME MEDICATIONS: Current Outpatient Medications  Medication Sig Dispense Refill  . Asenapine Maleate (SAPHRIS) 10 MG SUBL Place 1 tablet (10 mg total) under the tongue at bedtime. 30 tablet 1  . beclomethasone (QVAR) 40 MCG/ACT inhaler Inhale 1 puff into the lungs 2 (two) times daily as needed (asthma).     . botulinum toxin Type A (BOTOX) 100 units SOLR injection Inject 200 Units into the  muscle every 3 (three) months. Inject into head and neck muscles 2 each 3  . clonazePAM (KLONOPIN) 1 MG tablet Take 1 mg by mouth 3 (three) times daily as needed for anxiety.     . Fremanezumab-vfrm (AJOVY) 225 MG/1.5ML SOAJ Inject 225  mg into the skin every 30 (thirty) days. 1.5 mL 11  . hydrOXYzine (ATARAX/VISTARIL) 10 MG tablet TAKE 1 TABLET BY MOUTH 3 TIMES DAILY AS NEEDED. 30 tablet 0  . ibuprofen (ADVIL) 800 MG tablet Take 1 tablet (800 mg total) by mouth every 8 (eight) hours as needed. 30 tablet 0  . levETIRAcetam (KEPPRA) 250 MG tablet Take 2 tablets (500 mg total) by mouth 2 (two) times daily. 120 tablet 5  . megestrol (MEGACE) 20 MG tablet Take 1 tablet (20 mg total) by mouth daily. Take 20 mg BID x 7 days. Then 20 mg qd 30 tablet 0  . oxcarbazepine (TRILEPTAL) 600 MG tablet Take 1 tablet (600 mg total) by mouth 2 (two) times daily. 60 tablet 1  . SUMAtriptan (IMITREX) 100 MG tablet TAKE 1 TABLET AT ONSET MAY REPEAT IN 2 HOURS IF NEEDED MAY DOSE 2 PER DAY OR 3 (Patient taking differently: Take 100 mg by mouth every 2 (two) hours as needed for migraine. MAY DOSE 2 PER DAY OR 3) 12 tablet 11  . tranexamic acid (LYSTEDA) 650 MG TABS tablet Take 2 tablets (1,300 mg total) by mouth 3 (three) times daily. 30 tablet 11   No current facility-administered medications for this visit.    PAST MEDICAL HISTORY: Past Medical History:  Diagnosis Date  . Acute respiratory failure with hypoxia ([place])   . Acute rhinosinusitis 07/03/2018  . Allergic rhinitis   . Anxiety   . Asthma   . Back pain   . Bipolar disorder ([place])   . Borderline personality disorder ([name])   . Bupropion overdose   . Chronic pain   . Chronic pain syndrome   . Depression   . Hallucinations   . Migraines   . Nasal polyps   . Nausea   . Neck pain   . OCD (obsessive compulsive disorder)   . OCD (obsessive compulsive disorder)   . Ovarian cyst    left    PAST SURGICAL HISTORY: Past Surgical History:  Procedure Laterality Date  . LAPAROSCOPIC TUBAL LIGATION Bilateral 06/13/2019   Procedure: LAPAROSCOPIC TUBAL LIGATION;  Surgeon: Chancy Milroy, MD;  Location: [place];  Service: Gynecology;  Laterality: Bilateral;  FILSHIE CLIPS  .  NASAL SINUS SURGERY    . WISDOM TOOTH EXTRACTION      FAMILY HISTORY: Family History  Problem Relation Age of Onset  . Healthy Father   . Healthy Mother   . Arthritis Maternal Grandmother   . Arthritis Maternal Grandfather   . Depression Paternal Grandmother     SOCIAL HISTORY: Social History   Socioeconomic History  . Marital status: Married    Spouse name: Not on file  . Number of children: 0  . Years of education: 48  . Highest education level: Not on file  Occupational History  . Occupation: Unemployed  Tobacco Use  . Smoking status: Current Every Day Smoker    Packs/day: 0.50    Types: Cigarettes  . Smokeless tobacco: Never Used  Vaping Use  . Vaping Use: Every day  . Substances: Nicotine  Substance and Sexual Activity  . Alcohol use: Never    Alcohol/week: 0.0 standard drinks  . Drug use: No  .  Sexual activity: Yes    Birth control/protection: Condom  Other Topics Concern  . Not on file  Social History Narrative   Lives at home alone.   Right-handed.   3 glasses of green tea per day.   Social Determinants of Health   Financial Resource Strain:   . Difficulty of Paying Living Expenses:   Food Insecurity:   . Worried About Charity fundraiser in the Last Year:   . Arboriculturist in the Last Year:   Transportation Needs:   . Film/video editor (Medical):   Marland Kitchen Lack of Transportation (Non-Medical):   Physical Activity:   . Days of Exercise per Week:   . Minutes of Exercise per Session:   Stress:   . Feeling of Stress :   Social Connections:   . Frequency of Communication with Friends and Family:   . Frequency of Social Gatherings with Friends and Family:   . Attends Religious Services:   . Active Member of Clubs or Organizations:   . Attends Archivist Meetings:   Marland Kitchen Marital Status:   Intimate Partner Violence:   . Fear of Current or Ex-Partner:   . Emotionally Abused:   Marland Kitchen Physically Abused:   . Sexually Abused:      PHYSICAL  EXAM   Vitals:   09/05/19 1319  Height: 5\' 11"  (1.803 m)   Not recorded     Body mass index is 30.13 kg/m.  PHYSICAL EXAMNIATION:  Depressed looking young female, alert oriented, to history taking and care of conversation,   DIAGNOSTIC DATA (LABS, IMAGING, TESTING) - I reviewed patient records, labs, notes, testing and imaging myself where available.   ASSESSMENT AND PLAN  ALICEN DONALSON is a 33 y.o. 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  Call clinic for recurrent event  Chronic migraine headaches  Imitrex 100 mg as needed, limit the use to less than 3 times each week, may combine with NSAIDs, Zofran  Botox injection for chronic migraine prevention, injection was performed according to [place] protocol,  5 units of Botox was injected into each side, for 31 injection sites, total of 155 units  Bilateral frontalis 4 injection sites Bilateral corrugate 2 injection sites Procerus 1 injection sites. Bilateral temporalis 8 injection sites Bilateral occipitalis 6 injection sites Bilateral cervical paraspinals 4 injection sites Bilateral upper trapezius 6 injection sites  Extra 45 unites were injected into bilateral temporal, masseters  Marcial Pacas, M.D. Ph.D.  [facility] [address] [place], [place] [address] Ph: [phone] Fax: [phone]  CC: Referring Provider

## 2019-09-06 ENCOUNTER — Other Ambulatory Visit: Payer: Self-pay

## 2019-09-06 ENCOUNTER — Ambulatory Visit (HOSPITAL_COMMUNITY)
Admission: EM | Admit: 2019-09-06 | Discharge: 2019-09-06 | Disposition: A | Discharge status: Home / Self Care | Payer: Medicaid Other | Attending: Internal Medicine | Admitting: Internal Medicine

## 2019-09-06 ENCOUNTER — Ambulatory Visit (HOSPITAL_COMMUNITY)
Admission: EM | Admit: 2019-09-06 | Discharge: 2019-09-06 | Disposition: A | Discharge status: Home / Self Care | Payer: Medicaid Other | Attending: Behavioral Health | Admitting: Behavioral Health

## 2019-09-06 ENCOUNTER — Encounter (HOSPITAL_COMMUNITY): Payer: Self-pay

## 2019-09-06 DIAGNOSIS — R45851 Suicidal ideations: Secondary | ICD-10-CM | POA: Diagnosis not present

## 2019-09-06 DIAGNOSIS — F603 Borderline personality disorder: Secondary | ICD-10-CM | POA: Diagnosis not present

## 2019-09-06 DIAGNOSIS — F319 Bipolar disorder, unspecified: Secondary | ICD-10-CM | POA: Insufficient documentation

## 2019-09-06 DIAGNOSIS — X781XXA Intentional self-harm by knife, initial encounter: Secondary | ICD-10-CM | POA: Insufficient documentation

## 2019-09-06 DIAGNOSIS — Z79899 Other long term (current) drug therapy: Secondary | ICD-10-CM | POA: Insufficient documentation

## 2019-09-06 DIAGNOSIS — S51812A Laceration without foreign body of left forearm, initial encounter: Secondary | ICD-10-CM | POA: Insufficient documentation

## 2019-09-06 DIAGNOSIS — Z915 Personal history of self-harm: Secondary | ICD-10-CM | POA: Insufficient documentation

## 2019-09-06 DIAGNOSIS — F419 Anxiety disorder, unspecified: Secondary | ICD-10-CM | POA: Diagnosis not present

## 2019-09-06 DIAGNOSIS — F4320 Adjustment disorder, unspecified: Secondary | ICD-10-CM | POA: Insufficient documentation

## 2019-09-06 NOTE — ED Notes (Signed)
Pt. Items placed in locker 11

## 2019-09-06 NOTE — ED Provider Notes (Signed)
[place]    CSN: [id number] Arrival date & time: 09/06/19  [place]      History   Chief Complaint Chief Complaint  Patient presents with  . Laceration    HPI Carmen Daniels is a 34 y.o. female with a history of bipolar disorder comes to urgent care today with 4 self-inflicted laceration wounds to the left forearm.  Patient cut himself around 4 PM.  According to patient she bled quite a bit.  She has been depressed lately and wanted to get some of the pain out of his system.  She denies any suicide intent.  Patient recently connected with Dr. Roanna Epley at the behavioral [facility] on [address] and she will leave from the urgent care after laceration repair to go to the behavioral health urgent care to be evaluated.  No homicidal ideation.  Patient has a history of self-inflicted lacerations.  Patient is accompanied by mother who will drive her to the behavioral health urgent care.  Currently she denies any suicidal or homicidal ideation. HPI  Past Medical History:  Diagnosis Date  . Acute respiratory failure with hypoxia ([place])   . Acute rhinosinusitis 07/03/2018  . Allergic rhinitis   . Anxiety   . Asthma   . Back pain   . Bipolar disorder ([place])   . Borderline personality disorder ([place])   . Bupropion overdose   . Chronic pain   . Chronic pain syndrome   . Depression   . Hallucinations   . Migraines   . Nasal polyps   . Nausea   . Neck pain   . OCD (obsessive compulsive disorder)   . OCD (obsessive compulsive disorder)   . Ovarian cyst    left    Patient Active Problem List   Diagnosis Date Noted  . Adjustment disorder with mixed anxiety and depressed mood 07/31/2019  . Suicidal ideation   . Post-operative state 07/19/2019  . Seizures ([place]) 06/08/2019  . Bipolar 1 disorder ([place]) 05/17/2019  . Status epilepticus ([place]) 05/17/2019  . Intentional benzodiazepine overdose ([place]) 03/29/2019  . Menorrhagia 02/28/2019  . Intractable chronic migraine  without aura and without status migrainosus 02/08/2019  . History of elective abortion 12/28/2018  . Unwanted fertility 12/28/2018  . QT prolongation 07/03/2018  . AMS (altered mental status) 07/03/2018  . Serum total bilirubin elevated 07/03/2018  . Syncope 07/02/2018  . Sinus congestion 04/13/2017  . Borderline personality disorder ([place]) 04/11/2017  . Bipolar I disorder, current or most recent episode manic, with psychotic features ([place]) 04/10/2017  . Bipolar disorder ([place]) 04/10/2017  . Abnormal MRI of head 04/21/2016  . Chronic migraine without aura 03/27/2015  . Mild persistent asthma 12/30/2014  . Allergic rhinitis due to pollen 12/30/2014  . 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 Nasal polyposis 12/30/2014    Past Surgical History:  Procedure Laterality Date  . LAPAROSCOPIC TUBAL LIGATION Bilateral 06/13/2019   Procedure: LAPAROSCOPIC TUBAL LIGATION;  Surgeon: Chancy Milroy, MD;  Location: [facility];  Service: Gynecology;  Laterality: Bilateral;  FILSHIE CLIPS  . NASAL SINUS SURGERY    . WISDOM TOOTH EXTRACTION      OB History   No obstetric history on file.      Home Medications    Prior to Admission medications   Medication Sig Start Date End Date Taking? Authorizing Provider  AJOVY 225 MG/1.5ML SOSY SMARTSIG:1 Syringe(s) SUB-Q 08/22/19   [provider]  Asenapine Maleate (SAPHRIS) 10 MG SUBL Place 1 tablet (10  mg total) under the tongue at bedtime. 08/22/19   Salley Slaughter, NP  beclomethasone (QVAR) 40 MCG/ACT inhaler Inhale 1 puff into the lungs 2 (two) times daily as needed (asthma).     [provider]  botulinum toxin Type A (BOTOX) 100 units SOLR injection Inject 200 Units into the muscle every 3 (three) months. Inject into head and neck muscles 04/30/19   Marcial Pacas, MD  clonazePAM (KLONOPIN) 1 MG tablet Take 1 mg by mouth 4 (four) times daily.  07/14/19   [provider]  docusate sodium (COLACE) 100 MG capsule  Take 100 mg by mouth every 12 (twelve) hours. 06/21/19   [provider]  Eszopiclone (ESZOPICLONE) 3 MG TABS Take 3 mg by mouth at bedtime. Take immediately before bedtime    [provider]  Fremanezumab-vfrm (AJOVY) 225 MG/1.5ML SOAJ Inject 225 mg into the skin every 30 (thirty) days. 03/19/19   Marcial Pacas, MD  gabapentin (NEURONTIN) 300 MG capsule Take 300 mg by mouth 3 (three) times daily as needed. 08/16/19   [provider]  hydrOXYzine (ATARAX/VISTARIL) 10 MG tablet TAKE 1 TABLET BY MOUTH 3 TIMES DAILY AS NEEDED. 09/03/19   Eulis Canner E, NP  ibuprofen (ADVIL) 800 MG tablet Take 1 tablet (800 mg total) by mouth every 8 (eight) hours as needed. 06/13/19   Chancy Milroy, MD  levETIRAcetam (KEPPRA) 250 MG tablet Take 2 tablets (500 mg total) by mouth 2 (two) times daily. 06/05/19   Marcial Pacas, MD  megestrol (MEGACE) 20 MG tablet Take 1 tablet (20 mg total) by mouth daily. Take 20 mg BID x 7 days. Then 20 mg qd 09/03/19   Chancy Milroy, MD  metroNIDAZOLE (FLAGYL) 500 MG tablet Take 500 mg by mouth 2 (two) times daily. 08/16/19   [provider]  oxcarbazepine (TRILEPTAL) 600 MG tablet Take 1 tablet (600 mg total) by mouth 2 (two) times daily. 08/22/19   Salley Slaughter, NP  SUMAtriptan (IMITREX) 100 MG tablet TAKE 1 TABLET AT ONSET MAY REPEAT IN 2 HOURS IF NEEDED MAY DOSE 2 PER DAY OR 3 Patient taking differently: Take 100 mg by mouth every 2 (two) hours as needed for migraine. MAY DOSE 2 PER DAY OR 3 06/28/19   Marcial Pacas, MD  tranexamic acid (LYSTEDA) 650 MG TABS tablet Take 2 tablets (1,300 mg total) by mouth 3 (three) times daily. 07/19/19   Chancy Milroy, MD    Family History Family History  Problem Relation Age of Onset  . Healthy Father   . Healthy Mother   . Arthritis Maternal Grandmother   . Arthritis Maternal Grandfather   . Depression Paternal Grandmother     Social History Social History   Tobacco Use  . Smoking status: Current  Every Day Smoker    Packs/day: 0.50    Types: Cigarettes  . Smokeless tobacco: Never Used  Vaping Use  . Vaping Use: Every day  . Substances: Nicotine  Substance Use Topics  . Alcohol use: Never    Alcohol/week: 0.0 standard drinks  . Drug use: No     Allergies   Keflex [cephalexin] and Flagyl [metronidazole]   Review of Systems Review of Systems  Constitutional: Negative.   Respiratory: Negative.   Genitourinary: Negative.   Musculoskeletal: Negative.   Skin: Positive for color change and wound. Negative for pallor and rash.  Neurological: Negative.   Psychiatric/Behavioral: Positive for suicidal ideas. Negative for confusion, decreased concentration, hallucinations, self-injury and sleep disturbance.  Physical Exam Triage Vital Signs ED Triage Vitals  Enc Vitals Group     BP 09/06/19 1855 111/72     Pulse Rate 09/06/19 1855 91     Resp 09/06/19 1855 18     Temp 09/06/19 1855 99.1 F (37.3 C)     Temp Source 09/06/19 1855 Oral     SpO2 09/06/19 1855 100 %     Weight 09/06/19 1853 200 lb (90.7 kg)     Height --      Head Circumference --      Peak Flow --      Pain Score 09/06/19 1853 9     Pain Loc --      Pain Edu? --      Excl. in [place]? --    No data found.  Updated Vital Signs BP 111/72 (BP Location: Right Arm)   Pulse 91   Temp 99.1 F (37.3 C) (Oral)   Resp 18   Wt 90.7 kg   LMP  (LMP Unknown)   SpO2 100%   BMI 27.89 kg/m   Visual Acuity Right Eye Distance:   Left Eye Distance:   Bilateral Distance:    Right Eye Near:   Left Eye Near:    Bilateral Near:     Physical Exam Vitals and nursing note reviewed.  Constitutional:      General: She is not in acute distress.    Appearance: She is not ill-appearing.  Skin:    Comments: Multiple superficial lacerations over the left forearm.  The longest and deepest laceration is about 4 inches long.  Bleeding is controlled.  Neurological:     General: No focal deficit present.     Mental  Status: She is alert and oriented to person, place, and time.  Psychiatric:     Comments: Flat affect and depressed mood.      UC Treatments / Results  Labs (all labs ordered are listed, but only abnormal results are displayed) Labs Reviewed - No data to display  EKG   Radiology No results found.  Procedures Laceration Repair  Date/Time: 09/06/2019 7:36 PM Performed by: Chase Picket, MD Authorized by: Chase Picket, MD   Consent:    Consent obtained:  Verbal   Risks discussed:  Need for additional repair, poor cosmetic result and poor wound healing   Alternatives discussed:  No treatment and delayed treatment Anesthesia (see MAR for exact dosages):    Anesthesia method:  Local infiltration   Local anesthetic:  Lidocaine 2% WITH epi Laceration details:    Location:  Shoulder/arm   Shoulder/arm location:  L lower arm   Length (cm):  5   Depth (mm):  5 Repair type:    Repair type:  Simple Pre-procedure details:    Preparation:  Patient was prepped and draped in usual sterile fashion Exploration:    Hemostasis achieved with:  Direct pressure   Wound exploration: wound explored through full range of motion     Contaminated: no   Treatment:    Area cleansed with:  Betadine   Amount of cleaning:  Standard   Irrigation solution:  Sterile saline Skin repair:    Repair method:  Sutures   Suture size:  4-0   Suture material:  Prolene   Suture technique:  Simple interrupted   Number of sutures:  5 Approximation:    Approximation:  Close Post-procedure details:    Dressing:  Antibiotic ointment   Patient tolerance of procedure:  Tolerated well,  no immediate complications   (including critical care time)  Medications Ordered in UC Medications - No data to display  Initial Impression / Assessment and Plan / UC Course  I have reviewed the triage vital signs and the nursing notes.  Pertinent labs & imaging results that were available during my care of the  patient were reviewed by me and considered in my medical decision making (see chart for details).     1.  Laceration of the left forearm-self-inflicted: Laceration repair completed Discharge instructions given  2.  Bipolar disorder-uncontrolled Patient is going to the behavioral health urgent care to be evaluated Patient had no suicidal or homicidal ideation at the time of being seen here in the medical urgent care. Final Clinical Impressions(s) / UC Diagnoses   Final diagnoses:  Laceration of left forearm, initial encounter     Discharge Instructions     Daily wound dressing changes Keep the wound dry for today After 48 hours it is okay for mild soap and water to get on the wound Return to the urgent care in 7 to 10 days for suture removal If you notice increased redness, discharge, swelling and/or pain please return to urgent care to be reevaluated.   ED Prescriptions    None     PDMP not reviewed this encounter.   Chase Picket, MD 09/06/19 [phone]

## 2019-09-06 NOTE — ED Triage Notes (Addendum)
Pt is here with 4 self inflicted lacerations on her left inner forearm, she starting cutting herself around 4pm. Her mom found her in the bed in her apartment, mom states she lost a lot of blood and that she has been very depressed lately. Pt goes to a new psych doctor Burt Ek, DO at behavioral health on [address], she started seeing her last month. Pt is requesting help from [place].

## 2019-09-06 NOTE — Telephone Encounter (Signed)
(  1) 200U vial of Botox delivered today from [facility]. Placed in office stock.

## 2019-09-06 NOTE — BH Assessment (Signed)
Comprehensive Clinical Assessment (CCA) Note  09/06/2019 Carmen Daniels [id number]    Carmen Daniels is an 34 y.o. female presenting with SI with no plan. Patient reported onset of SI was earlier today per doctor EDP,female with a history of bipolar disorder comes to urgent care today with 4 self-inflicted laceration wounds to the left forearm.  Patient cut himself around 4 PM.  According to patient she bled quite a bit.  She has been depressed lately and wanted to get some of the pain out of his system.  She denies any suicide intent.  Patient recently connected with Dr. Roanna Epley at the behavioral [facility] on [address] [place] and she will leave from the urgent care after laceration repair to go to the behavioral health urgent care to be evaluated.  No homicidal ideation.  Patient has a history of self-inflicted lacerations.  Patient is accompanied by mother who will drive her to the behavioral health urgent care.  Currently she denies any suicidal or homicidal ideation."  Pt currently denies HI, AVH buy admits to self inflicted cuts on her arm, she states that she was indeed feeling suicidal earlier when she cut herself to relieve her stress and pain. Pt states she now feels better and is no longer SI. Pt reports history of cutting and SI attempt back in February 2021 by overdosing. Pt currently has provider through [facility] sees as well as therapy weekly through [facility] online platform. Pt reports she currently on medications denies any drug abuse. Pt reports getting only 4 hours of sleep but has a good appetite, and has gained weight recently due to medications. Pt admits to depressive symptoms: Tearfulness, guilt, feelings of worthlessness, hopelessness, isolation, irritability and anxiety. Pt denies any trauma/abuse history. Pt reports mother does have depression  No family SI attempts. Pt reports most of her stress comes from losing a pet a few months ago and her current pet is  severally sick, whom she has had for over 20 years. Pt reports no access to weapons currently, no violence or charges currently. Pt reports she can keep herslelf safe at this time and currently not SI, will go home with her mother tonight who accompanied her here.  Diagnosis: Bipolar Disorder, current episode depressed, severe Disposition: Talbot Grumbling, FNP recommends pt is psych cleared.    Visit Diagnosis:   No diagnosis found.    CCA Screening, Triage and Referral (STR)  Patient Reported Information How did you hear about [place]? Hospital Discharge  Referral name: Zacarias Pontes  Referral phone number: No data recorded  Whom do you see for routine medical problems? Primary Care  Practice/Facility Name: [facility])  Practice/Facility Phone Number: No data recorded Name of Contact: No data recorded Contact Number: No data recorded Contact Fax Number: No data recorded Prescriber Name: No data recorded Prescriber Address (if known): No data recorded  What Is the Reason for Your Visit/Call Today? SI attempt (Self Inflicted Wound/ SI attempt buy cutting)  How Long Has This Been Causing You Problems? <Week  What Do You Feel Would Help You the Most Today? Medication;Therapy;Assessment Only   Have You Recently Been in Any Inpatient Treatment (Hospital/Detox/Crisis Center/28-Day Program)? No  Name/Location of Program/Hospital:No data recorded How Long Were You There? No data recorded When Were You Discharged? No data recorded  Have You Ever Received Services From [facility] Before? Yes  Who Do You See at [facility]? No data recorded  Have You Recently Had Any Thoughts  About Hurting Yourself? Yes  Are You Planning to Commit Suicide/Harm Yourself At This time? Yes   Have you Recently Had Thoughts About Hurting Someone [place] Dawn? Yes  Explanation: No data recorded  Have You Used Any Alcohol or Drugs in the Past 24 Hours? No data recorded How Long Ago Did You Use  Drugs or Alcohol? No data recorded What Did You Use and How Much? No data recorded  Do You Currently Have a Therapist/Psychiatrist? Yes  Name of Therapist/Psychiatrist: [facility] [place])   Have You Been Recently Discharged From Any Office Practice or Programs? No  Explanation of Discharge From Practice/Program: No data recorded    CCA Screening Triage Referral Assessment Type of Contact: Face-to-Face  Is this Initial or Reassessment? No data recorded Date Telepsych consult ordered in [facility]:  No data recorded Time Telepsych consult ordered in [facility]:  No data recorded  Patient Reported Information Reviewed? Yes  Patient Left Without Being Seen? No data recorded Reason for Not Completing Assessment: No data recorded  Collateral Involvement: No data recorded  Does Patient Have a [place]? No data recorded Name and Contact of Legal Guardian: No data recorded If Minor and Not Living with Parent(s), Who has Custody? No data recorded Is CPS involved or ever been involved? Never  Is APS involved or ever been involved? Never   Patient Determined To Be At Risk for Harm To Self or Others Based on Review of Patient Reported Information or Presenting Complaint? Yes, for Self-Harm  Method: No data recorded Availability of Means: No data recorded Intent: No data recorded Notification Required: No data recorded Additional Information for Danger to Others Potential: No data recorded Additional Comments for Danger to Others Potential: No data recorded Are There Guns or Other Weapons in Your Home? No data recorded Types of Guns/Weapons: No data recorded Are These Weapons Safely Secured?                            No data recorded Who Could Verify You Are Able To Have These Secured: No data recorded Do You Have any Outstanding Charges, Pending Court Dates, Parole/Probation? No data recorded Contacted To Inform of Risk of Harm To Self or  Others: Other: Comment   Location of Assessment: [facility]   Does Patient Present under Involuntary Commitment? No  IVC Papers Initial File Date: No data recorded  [place] of Residence: [place]   Patient Currently Receiving the Following Services: Medication Management (Outpatient)   Determination of Need: No data recorded  Options For Referral: Outpatient Therapy;Medication Management   Donato Heinz, LCSWA  CCA Biopsychosocial  Intake/Chief Complaint:  CCA Intake With Chief Complaint CCA Part Two Date: 09/06/19 CCA Part Two Time: 2123 Chief Complaint/Presenting Problem:  (self inflicted wound)  Mental Health Symptoms Depression:  Depression: Hopelessness, Irritability, Sleep (too much or little), Tearfulness, Weight gain/loss, Duration of symptoms greater than two weeks, Worthlessness, Fatigue  Mania:  Mania: Irritability, Change in energy/activity, Increased Energy, Euphoria, Racing thoughts  Anxiety:   Anxiety: Irritability, Difficulty concentrating, Worrying, Sleep  Psychosis:  Psychosis: Hallucinations, Duration of symptoms greater than six months (pt has had AVH last month not presently)  Trauma:  Trauma: None  Obsessions:  Obsessions: Cause anxiety, Disrupts routine/functioning, Recurrent & persistent thoughts/impulses/images  Compulsions:  Compulsions: None, Disrupts with routine/functioning, Repeated behaviors/mental acts, "Driven" to perform behaviors/acts, Intended to reduce stress or prevent another outcome  Inattention:  Inattention:  None  Hyperactivity/Impulsivity:  Hyperactivity/Impulsivity: N/A  Oppositional/Defiant Behaviors:  Oppositional/Defiant Behaviors: None  Emotional Irregularity:  Emotional Irregularity: None  Other Mood/Personality Symptoms:      Mental Status Exam Appearance and self-care  Stature:  Stature: Average  Weight:  Weight: Average weight  Clothing:  Clothing: Casual  Grooming:  Grooming: Normal  Cosmetic use:   Cosmetic Use: Age appropriate  Posture/gait:  Posture/Gait: Normal  Motor activity:  Motor Activity:  (Normal)  Sensorium  Attention:  Attention: Normal  Concentration:     Orientation:  Orientation: Situation, Person, Object, Time  Recall/memory:  Recall/Memory: Normal  Affect and Mood  Affect:  Affect: Blunted, Depressed  Mood:  Mood: Depressed  Relating  Eye contact:  Eye Contact: Normal  Facial expression:     Attitude toward examiner:  Attitude Toward Examiner: Cooperative  Thought and Language  Speech flow: Speech Flow: Clear and Coherent  Thought content:  Thought Content: Appropriate to Mood and Circumstances  Preoccupation:  Preoccupations: None  Hallucinations:  Hallucinations:  (AVH a month ago stable on meds not right now)  Organization:     Transport planner of Knowledge:  Fund of Knowledge: Good  Intelligence:  Intelligence: Average  Abstraction:  Abstraction: Concrete  Judgement:  Judgement: Good  Reality Testing:  Reality Testing: Adequate  Insight:  Insight: Good  Decision Making:  Decision Making: Normal  Social Functioning  Social Maturity:  Social Maturity: Responsible  Social Judgement:  Social Judgement: Normal  Stress  Stressors:  Stressors: Other (Comment)  Coping Ability:     Skill Deficits:     Supports:  Supports: Family     Religion: Religion/Spirituality Are You A Religious Person?: No How Might This Affect Treatment?:  (NA)  Leisure/Recreation: Leisure / Recreation Do You Have Hobbies?: Yes (knit and swim) Leisure and Hobbies:  (swim)  Exercise/Diet: Exercise/Diet Do You Exercise?: Yes (swim) What Type of Exercise Do You Do?: Other (Comment) How Many Times a Week Do You Exercise?: Daily Have You Gained or Lost A Significant Amount of Weight in the Past Six Months?: Yes-Gained Number of Pounds Gained:  (unknown) Do You Follow a Special Diet?: Yes Type of Diet:  (Vegetarian) Do You Have Any Trouble Sleeping?:  Yes Explanation of Sleeping Difficulties:  (stress)   CCA Employment/Education  Employment/Work Situation: Employment / Work Situation Employment situation: On disability Why is patient on disability:  (Mental disability) How long has patient been on disability:  (10 years +) What is the longest time patient has a held a job?:  (NA) Where was the patient employed at that time?:  (NA) Has patient ever been in the [employer]?: No  Education: Education Is Patient Currently Attending School?: No Last Grade Completed: 12 Name of High School:  ([employer]) Did Teacher, adult education From [employer]?: Yes Did Physicist, medical?: Yes What Type of College Degree Do you Have?:  (ECPI/ Medical Adminstration) Did You Attend Graduate School?: No Did You Have An Individualized Education Program (IIEP): No Did You Have Any Difficulty At School?: No Patient's Education Has Been Impacted by Current Illness: No   CCA Family/Childhood History  Family and Relationship History: Family history Marital status: Divorced Divorced, when?:  Psychologist, forensic) What types of issues is patient dealing with in the relationship?:  Myer Haff) Additional relationship information:  ([employer]) Are you sexually active?: No What is your sexual orientation?:  (Bisexual) Has your sexual activity been affected by drugs, alcohol, medication, or emotional stress?:  (none) Does patient have children?: No  Childhood History:  Childhood History By whom was/is the patient raised?:  (NA) Additional childhood history information:  (NA) Description of patient's relationship with caregiver when they were a child:  (NA) Patient's description of current relationship with people who raised him/her:  (NA) How were you disciplined when you got in trouble as a child/adolescent?:  (NA) Does patient have siblings?: Yes Number of Siblings: 2 Description of patient's current relationship with siblings:  (Fair relationship) Did patient suffer any  verbal/emotional/physical/sexual abuse as a child?: No Did patient suffer from severe childhood neglect?: No Has patient ever been sexually abused/assaulted/raped as an adolescent or adult?: No Was the patient ever a victim of a crime or a disaster?: No Witnessed domestic violence?: No Has patient been affected by domestic violence as an adult?: No  Child/Adolescent Assessment:     CCA Substance Use  Alcohol/Drug Use: Alcohol / Drug Use History of alcohol / drug use?: No history of alcohol / drug abuse              DSM5 Diagnoses: Patient Active Problem List   Diagnosis Date Noted  . Adjustment disorder with mixed anxiety and depressed mood 07/31/2019  . Suicidal ideation   . Post-operative state 07/19/2019  . Seizures ([place]) 06/08/2019  . Bipolar 1 disorder ([place]) 05/17/2019  . Status epilepticus ([place]) 05/17/2019  . Intentional benzodiazepine overdose ([place]) 03/29/2019  . Menorrhagia 02/28/2019  . Intractable chronic 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 status migrainosus 02/08/2019  . History of elective abortion 12/28/2018  . Unwanted fertility 12/28/2018  . QT prolongation 07/03/2018  . AMS (altered mental status) 07/03/2018  . Serum total bilirubin elevated 07/03/2018  . Syncope 07/02/2018  . Sinus congestion 04/13/2017  . Borderline personality disorder ([place]) 04/11/2017  . Bipolar I disorder, current or most recent episode manic, with psychotic features ([place]) 04/10/2017  . Bipolar disorder ([place]) 04/10/2017  . Abnormal MRI of head 04/21/2016  . Chronic migraine without aura 03/27/2015  . Mild persistent asthma 12/30/2014  . Allergic rhinitis due to pollen 12/30/2014  . 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 Nasal polyposis 12/30/2014     Donato Heinz, LCSWA

## 2019-09-06 NOTE — Discharge Instructions (Addendum)
Pt will follow up with scheduled outpatient resources

## 2019-09-06 NOTE — ED Notes (Signed)
Pt not seen by nursing. TTS gave AVS to pt & walked her out.

## 2019-09-06 NOTE — ED Provider Notes (Signed)
Behavioral Health Medical Screening Exam  Carmen Daniels is a 34 y.o. female with a history of bipolar, adjustment disorder and borderline personality disorder presents to [facility] voluntarily as a walk-in accompanied by her mother. Pt reports that she has been having worsening depression for a week now due to her 4 y.o. cat Carmen Daniels being sick. Pt also reports she lost her dog in December. Today, pt reports feeling suicidal earlier with no plans but she states she cut her forearm with a knife and had to get some stitches at [facility] urgent care. Pt states she was not trying to hurt herself but was trying to relieve some pressure. Pt reports she has a history of self harm by cutting but has not cut in 6 months. Pt denies current SI, HI, and paranoia. Pt reports a history of AVH but states her medication has been effective. Pt reports a history of prior SA sometime this year by overdose. Pt denies access to guns. She denies any drug use or alcohol. She denies any history of abuse or trauma. Pt sees a therapist Carmen Daniels through through [facility] and a psychiatrist Carmen Daniels through [facility]. Pt states she sleeps 4 hours and has a good appetite. Pt states she can contract for safety and can follow up with her outpatient resources.  Total Time spent with patient: 30 minutes  Psychiatric Specialty Exam  Presentation  General Appearance:Appropriate for Environment  Eye Contact:Good  Speech:Normal Rate  Speech Volume:Normal  Handedness:Right   Mood and Affect  Mood:Anxious;Depressed  Affect:Congruent;Depressed   Thought Process  Thought Processes:Coherent  Descriptions of Associations:Intact  Orientation:Full (Time, Place and Person)  Thought Content:Logical  Hallucinations:None (Pt states her meds have been effective with her AVH)  Ideas of Reference:None  Suicidal Thoughts:No  Homicidal Thoughts:No   Sensorium  Memory:Immediate Good;Recent  Good  Judgment:Fair  Insight:Good   Executive Functions  Concentration:Good  Attention Span:Good  [place]  Language:Good   Psychomotor Activity  Psychomotor Activity:Normal   Assets  Assets:Communication Skills;Desire for Improvement;Financial Resources/Insurance;Housing;Leisure Time;Physical Health;Resilience;Social Support   Sleep  Sleep:Fair  Number of hours: 4   Physical Exam: Physical Exam ROS Blood pressure 125/77, pulse 88, temperature 98.4 F (36.9 C), temperature source Oral, resp. rate 16, SpO2 100 %. There is no height or weight on file to calculate BMI.  Musculoskeletal: Strength & Muscle Tone: within normal limits Gait & Station: normal Patient leans: N/A   Recommendations:  Based on my evaluation the patient does not appear to have an emergency medical condition.   Disposition: No evidence of imminent risk to self or others at present.   Patient does not meet criteria for psychiatric inpatient admission. Supportive therapy provided about ongoing stressors. Discussed crisis plan, support from social network, calling 911, coming to the Emergency Department, and calling Suicide Hotline.  Mliss Fritz, NP 09/06/2019, 10:20 PM

## 2019-09-06 NOTE — Discharge Instructions (Signed)
Daily wound dressing changes Keep the wound dry for today After 48 hours it is okay for mild soap and water to get on the wound Return to the urgent care in 7 to 10 days for suture removal If you notice increased redness, discharge, swelling and/or pain please return to urgent care to be reevaluated.

## 2019-09-10 ENCOUNTER — Other Ambulatory Visit (HOSPITAL_COMMUNITY): Payer: Self-pay | Admitting: Psychiatry

## 2019-09-10 DIAGNOSIS — F4010 Social phobia, unspecified: Secondary | ICD-10-CM

## 2019-09-13 ENCOUNTER — Encounter (HOSPITAL_COMMUNITY): Payer: Self-pay | Admitting: Psychiatry

## 2019-09-13 ENCOUNTER — Ambulatory Visit (INDEPENDENT_AMBULATORY_CARE_PROVIDER_SITE_OTHER): Payer: Medicaid Other | Admitting: Psychiatry

## 2019-09-13 ENCOUNTER — Other Ambulatory Visit: Payer: Self-pay

## 2019-09-13 DIAGNOSIS — F4010 Social phobia, unspecified: Secondary | ICD-10-CM | POA: Diagnosis not present

## 2019-09-13 DIAGNOSIS — F3130 Bipolar disorder, current episode depressed, mild or moderate severity, unspecified: Secondary | ICD-10-CM | POA: Diagnosis not present

## 2019-09-13 MED ORDER — HYDROXYZINE HCL 25 MG PO TABS: 25.0000 mg | ORAL_TABLET | Freq: Three times a day (TID) | ORAL | 1 refills | Status: DC | PRN

## 2019-09-13 MED ORDER — ARIPIPRAZOLE 5 MG PO TABS: 5.0000 mg | ORAL_TABLET | Freq: Every day | ORAL | 1 refills | Status: DC

## 2019-09-13 NOTE — Progress Notes (Signed)
BH MD/PA/NP OP Progress Note  09/13/2019 3:45 PM Carmen Daniels  MRN:  [id number]  Chief Complaint: "I started having dizziness, double vision, depression, and suicidal thoughts on the new medications".  HPI: 34 year old female seen today for followup psychiatric evaluation. She has a psychiatric history of bipolar disorder, borderline personality disorder, anxiety, depression, and SI/SA.  She is currently being managed on Trileptal 600 mg twice daily, hydroxyzine 10 mg 3 times daily, Saphris 10 mg at bedtime, Klonopin 1mg  four times a day (perscribed by PCP) and Gabapentin 300 mg three times daily (perscribed by PCP) .  She notes that since being on the medication her depressive symptoms has worsened, she has suicidal thoughts, has experienced double vision, nausea, and dizziness.  She informed Probation officer that she discontinued her medication 2 days ago and would like to try new medications.    Today patient endorses symptoms of depression such as psychomotor agitation, feelings of guilt, impaired memory, problems concentrating, passive SI, anxiety, compulsive behavior, and fatigue.  She informed Probation officer that she went to the ED on 09/06/2019 after she had cut her arm to relieve stress due to increasing depression and anxiety.  At that time medications were not adjusted and patient was told to follow-up with provider.  She endorse VAH and denies active SI/HI. At this time she contracts for safety. She denies any illegal substance use. She was seen with her mother today. She reports that her appetite has improved and since being off of Zyprexa and she notes that her daughter has lost weight. She notes that she is concerned about her mental state and is grateful that she was able to be seen today.   She is agreeable to discontinue Saphris and trileptal. She is also agreeable to starting Abilify 5 mg daily and vybrid 10 mg for one weeks and then increase the dose to 20 mg.  She was given a sample of Vybrid.  Patients  Hydroxyzine was increased to 25 mg TID to help manage anxiety. Potential side effects of medication and risks vs benefits of treatment vs non-treatment were explained and discussed. All questions were answered.  She will follow up with outpatient therapy for counseling. No other concerns noted at this time Visit Diagnosis:    ICD-10-CM   1. Bipolar I disorder, most recent episode depressed ([place])  F31.30 ARIPiprazole (ABILIFY) 5 MG tablet    Ambulatory referral to Social Work  2. Social anxiety disorder  F40.10 hydrOXYzine (ATARAX/VISTARIL) 25 MG tablet    Ambulatory referral to Social Work    Past Psychiatric History: bipolar disorder, borderline personality disorder, anxiety, depression, and SI/SA Past Medical History:  Past Medical History:  Diagnosis Date  . Acute respiratory failure with hypoxia ([place])   . Acute rhinosinusitis 07/03/2018  . Allergic rhinitis   . Anxiety   . Asthma   . Back pain   . Bipolar disorder ([place])   . Borderline personality disorder ([place])   . Bupropion overdose   . Chronic pain   . Chronic pain syndrome   . Depression   . Hallucinations   . Migraines   . Nasal polyps   . Nausea   . Neck pain   . OCD (obsessive compulsive disorder)   . OCD (obsessive compulsive disorder)   . Ovarian cyst    left    Past Surgical History:  Procedure Laterality Date  . LAPAROSCOPIC TUBAL LIGATION Bilateral 06/13/2019   Procedure: LAPAROSCOPIC TUBAL LIGATION;  Surgeon: Chancy Milroy, MD;  Location:  [facility];  Service: Gynecology;  Laterality: Bilateral;  FILSHIE CLIPS  . NASAL SINUS SURGERY    . WISDOM TOOTH EXTRACTION      Family Psychiatric History: Paternal grandmother depression Family History:  Family History  Problem Relation Age of Onset  . Healthy Father   . Healthy Mother   . Arthritis Maternal Grandmother   . Arthritis Maternal Grandfather   . Depression Paternal Grandmother     Social History:  Social History    Socioeconomic History  . Marital status: Married    Spouse name: Not on file  . Number of children: 0  . Years of education: 23  . Highest education level: Not on file  Occupational History  . Occupation: Unemployed  Tobacco Use  . Smoking status: Current Every Day Smoker    Packs/day: 0.50    Types: Cigarettes  . Smokeless tobacco: Never Used  Vaping Use  . Vaping Use: Every day  . Substances: Nicotine  Substance and Sexual Activity  . Alcohol use: Never    Alcohol/week: 0.0 standard drinks  . Drug use: No  . Sexual activity: Yes    Birth control/protection: Condom  Other Topics Concern  . Not on file  Social History Narrative   Lives at home alone.   Right-handed.   3 glasses of green tea per day.   Social Determinants of Health   Financial Resource Strain:   . Difficulty of Paying Living Expenses:   Food Insecurity:   . Worried About Charity fundraiser in the Last Year:   . Arboriculturist in the Last Year:   Transportation Needs:   . Film/video editor (Medical):   Marland Kitchen Lack of Transportation (Non-Medical):   Physical Activity:   . Days of Exercise per Week:   . Minutes of Exercise per Session:   Stress:   . Feeling of Stress :   Social Connections:   . Frequency of Communication with Friends and Family:   . Frequency of Social Gatherings with Friends and Family:   . Attends Religious Services:   . Active Member of Clubs or Organizations:   . Attends Archivist Meetings:   Marland Kitchen Marital Status:     Allergies:  Allergies  Allergen Reactions  . Keflex [Cephalexin] Anaphylaxis  . Flagyl [Metronidazole] Diarrhea    Metabolic Disorder Labs: No results found for: HGBA1C, MPG No results found for: PROLACTIN Lab Results  Component Value Date   TRIG 341 (H) 04/03/2019   No results found for: TSH  Therapeutic Level Labs: Lab Results  Component Value Date   LITHIUM <0.06 (L) 07/30/2019   LITHIUM <0.06 (L) 03/30/2019   No results found  for: VALPROATE No components found for:  CBMZ  Current Medications: Current Outpatient Medications  Medication Sig Dispense Refill  . AJOVY 225 MG/1.5ML SOSY SMARTSIG:1 Syringe(s) SUB-Q    . ARIPiprazole (ABILIFY) 5 MG tablet Take 1 tablet (5 mg total) by mouth daily. 30 tablet 1  . beclomethasone (QVAR) 40 MCG/ACT inhaler Inhale 1 puff into the lungs 2 (two) times daily as needed (asthma).     . botulinum toxin Type A (BOTOX) 100 units SOLR injection Inject 200 Units into the muscle every 3 (three) months. Inject into head and neck muscles 2 each 3  . clonazePAM (KLONOPIN) 1 MG tablet Take 1 mg by mouth 4 (four) times daily.     Marland Kitchen docusate sodium (COLACE) 100 MG capsule Take 100 mg by mouth every 12 (  twelve) hours.    . Eszopiclone (ESZOPICLONE) 3 MG TABS Take 3 mg by mouth at bedtime. Take immediately before bedtime    . Fremanezumab-vfrm (AJOVY) 225 MG/1.5ML SOAJ Inject 225 mg into the skin every 30 (thirty) days. 1.5 mL 11  . gabapentin (NEURONTIN) 300 MG capsule Take 300 mg by mouth 3 (three) times daily as needed.    . hydrOXYzine (ATARAX/VISTARIL) 25 MG tablet Take 1 tablet (25 mg total) by mouth 3 (three) times daily as needed. 90 tablet 1  . ibuprofen (ADVIL) 800 MG tablet Take 1 tablet (800 mg total) by mouth every 8 (eight) hours as needed. 30 tablet 0  . levETIRAcetam (KEPPRA) 250 MG tablet Take 2 tablets (500 mg total) by mouth 2 (two) times daily. 120 tablet 5  . megestrol (MEGACE) 20 MG tablet Take 1 tablet (20 mg total) by mouth daily. Take 20 mg BID x 7 days. Then 20 mg qd 30 tablet 0  . metroNIDAZOLE (FLAGYL) 500 MG tablet Take 500 mg by mouth 2 (two) times daily.    . SUMAtriptan (IMITREX) 100 MG tablet TAKE 1 TABLET AT ONSET MAY REPEAT IN 2 HOURS IF NEEDED MAY DOSE 2 PER DAY OR 3 (Patient taking differently: Take 100 mg by mouth every 2 (two) hours as needed for migraine. MAY DOSE 2 PER DAY OR 3) 12 tablet 11  . tranexamic acid (LYSTEDA) 650 MG TABS tablet Take 2 tablets  (1,300 mg total) by mouth 3 (three) times daily. 30 tablet 11   No current facility-administered medications for this visit.     Musculoskeletal: Strength & Muscle Tone: within normal limits Gait & Station: normal Patient leans: N/A  Psychiatric Specialty Exam: Review of Systems  There were no vitals taken for this visit.There is no height or weight on file to calculate BMI.  General Appearance: Well Groomed  Eye Contact:  Good  Speech:  Clear and Coherent and Normal Rate  Volume:  Normal  Mood:  Anxious and Depressed  Affect:  Congruent  Thought Process:  Coherent, Goal Directed and Linear  Orientation:  Full (Time, Place, and Person)  Thought Content: Logical and Hallucinations: Auditory Visual   Suicidal Thoughts:  No  Homicidal Thoughts:  No  Memory:  Immediate;   Good Recent;   Good Remote;   Good  Judgement:  Good  Insight:  Good  Psychomotor Activity:  Normal  Concentration:  Concentration: Good and Attention Span: Good  Recall:  Good  Fund of Knowledge: Good  Language: Good  Akathisia:  No  Handed:  Right  AIMS (if indicated):Not done  Assets:  Communication Skills Desire for Improvement Financial Resources/Insurance Housing Social Support  ADL's:  Intact  Cognition: WNL  Sleep:  Fair   Screenings: AIMS     Admission (Discharged) from 04/10/2017 in [facility]  AIMS Total Score 0    Mini-Mental     Office Visit from 10/05/2016 in [facility]  Total Score (max 30 points ) 26    PHQ2-9     ED [facility]/06/[facility] in [facility]  PHQ-2 Total Score 1       Assessment and Plan: Patient notes that she is experiencing increased depression  She also endorses visual and auditory hallucinations.  She notes that Saphris and Trileptal were ineffective in managing her psychiatric condition.  She notes that she discontinued the medications 2 days ago.  She is agreeable to starting  Abilify 5 mg daily to  help improve symptoms of psychosis.  She is also agreeable to starting Viibryd 10 mg for a week and then increasing to 20 mg following week.  Patient was given a sample of Viibryd and will follow up with provider in 2 weeks help improve depressive symptoms.  She is also agreeable to increasing hydroxyzine 25 mg 3 times a day to help with symptoms of anxiety.  1. Social anxiety disorder  Increased- hydrOXYzine (ATARAX/VISTARIL) 25 MG tablet; Take 1 tablet (25 mg total) by mouth 3 (three) times daily as needed.  Dispense: 90 tablet; Refill: 1 - Ambulatory referral to Social Work  2. Bipolar I disorder, most recent episode depressed ([place])  Start- ARIPiprazole (ABILIFY) 5 MG tablet; Take 1 tablet (5 mg total) by mouth daily.  Dispense: 30 tablet; Refill: 1 - Ambulatory referral to Social Work    Salley Slaughter, NP 09/13/2019, 3:45 PM

## 2019-09-25 ENCOUNTER — Ambulatory Visit (INDEPENDENT_AMBULATORY_CARE_PROVIDER_SITE_OTHER): Payer: Medicaid Other | Admitting: Psychiatry

## 2019-09-25 ENCOUNTER — Telehealth (HOSPITAL_COMMUNITY): Payer: Self-pay | Admitting: *Deleted

## 2019-09-25 ENCOUNTER — Encounter (HOSPITAL_COMMUNITY): Payer: Self-pay | Admitting: Psychiatry

## 2019-09-25 ENCOUNTER — Other Ambulatory Visit: Payer: Self-pay

## 2019-09-25 DIAGNOSIS — F3130 Bipolar disorder, current episode depressed, mild or moderate severity, unspecified: Secondary | ICD-10-CM

## 2019-09-25 MED ORDER — VIIBRYD 20 MG PO TABS: 20.0000 mg | ORAL_TABLET | Freq: Every day | ORAL | 2 refills | Status: DC

## 2019-09-25 MED ORDER — INGREZZA 40 MG PO CAPS: 40.0000 mg | ORAL_CAPSULE | Freq: Every day | ORAL | 2 refills | Status: DC

## 2019-09-25 MED ORDER — GABAPENTIN 300 MG PO CAPS: 300.0000 mg | ORAL_CAPSULE | Freq: Three times a day (TID) | ORAL | 2 refills | Status: DC | PRN

## 2019-09-25 MED ORDER — ARIPIPRAZOLE 5 MG PO TABS: 5.0000 mg | ORAL_TABLET | Freq: Every day | ORAL | 2 refills | Status: DC

## 2019-09-25 MED ORDER — TRAZODONE HCL 50 MG PO TABS: 50.0000 mg | ORAL_TABLET | Freq: Every evening | ORAL | 2 refills | Status: DC | PRN

## 2019-09-25 NOTE — Telephone Encounter (Signed)
Opened in error, no additional information required.

## 2019-09-25 NOTE — Progress Notes (Signed)
BH MD/PA/NP OP Progress Note  09/25/2019 1:21 PM Carmen Daniels  MRN:  [id number]  Chief Complaint: "I've been having painful spasms in my right leg."  HPI: 34 year old female seen today for follow-up psychiatric evaluation and medication management. She has a psychiatric history of bipolar disorder, borderline personality disorder, anxiety, depression, and SI/SA. She is currently managed on starting Abilify 5 mg daily, vybrid 20, and hydroxyzine 25 mg TID. She notes these medications are effective in managing her psychiatric conditions. She however notes that she is concerned about leg/arm spasms and jerking movements.    Today she reports that she has recently been experiencing painful right leg spasms and jerky movements.  She mentioned that she is concerned about signs of tardive dyskinesia noting she saw a commercial for the medication Ingrezza.  She reports that she is interested in trying to see if this Ingrezza medication could help her with her leg movements and spasms.  Provider informed patient that therapeutic dose of Ingrezza may interfere with her Abilify. Provider suggested trying Austedo. Patient endorsed understanding and agreed.  Patient also encouraged to start taking a daily multivitamin to ensure that she is receiving all of her daily nutrients as recommended.  Patient endorses depressive symptoms including a depressed mood, poor sleep, and feelings of guilt.  She made the provider aware that she had to put down her sick cat this past Monday and believes that this has contributed to her depressive symptoms.  She reports that she has racing thoughts about whether she made the right decision or not to put down her cat and she feels guilty about having to make that decision.  She reports that she hasn't slept and has only taken short naps since Monday. Her mother notes that her sleep has been poor for weeks. Patient reports that she is prescribed Lunesta 3mg  medication from her PCP, but  reports that this has not been effective in helping her to sleep.  Patient offered to start Trazodone medication at 25-50 mg as needed at bedtime to improve sleep.  Patient is agreeable to start Trazodone medication at this time.  In addition, patient and her mother mentioned interest in ECT and wanted to know if she could possibly be a candidate for this type of therapy.  Provider discussed in depth with patient about how the procedure is performed and the risk versus benefits of ECT.  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  However, patient reports that she has been taking Keppra medication as prescribed by her PCP or specialist to prevent seizures.  Provider discussed with the patient and her mother that prior to referring her to ECT more evaluation should be conducted and the option of ECT can be re-evaluated in the future based on her symptom improvement with medication use.  Patient and her mother report understanding and are agreeable with this recommendation at this time.   She reports that she is scheduled to follow-up with outpatient therapy for counseling tomorrow 09/26/19.  She is hopeful that therapy will be able to help her through processing her grief surrounding the loss of her cat.  Patient denies any recent hallucinations since starting Abilify medication at her last visit.  Patient denies any SI/HI at this time.  Patient is agreeable to continue all other medications as prescribed. No other concerns noted at this time   Visit Diagnosis:    ICD-10-CM   1.  Bipolar I disorder, most recent episode depressed ([place])  F31.30 traZODone (DESYREL) 50 MG tablet    Vilazodone HCl (VIIBRYD) 20 MG TABS    gabapentin (NEURONTIN) 300 MG capsule    ARIPiprazole (ABILIFY) 5 MG tablet    DISCONTINUED: Valbenazine Tosylate (INGREZZA) 40 MG CAPS    DISCONTINUED: Valbenazine Tosylate (INGREZZA) 40 MG CAPS    Past  Psychiatric History: bipolar disorder, borderline personality disorder, anxiety, depression, and SI/SA Past Medical History:  Past Medical History:  Diagnosis Date  . Acute respiratory failure with hypoxia ([place])   . Acute rhinosinusitis 07/03/2018  . Allergic rhinitis   . Anxiety   . Asthma   . Back pain   . Bipolar disorder ([place])   . Borderline personality disorder ([place])   . Bupropion overdose   . Chronic pain   . Chronic pain syndrome   . Depression   . Hallucinations   . Migraines   . Nasal polyps   . Nausea   . Neck pain   . OCD (obsessive compulsive disorder)   . OCD (obsessive compulsive disorder)   . Ovarian cyst    left    Past Surgical History:  Procedure Laterality Date  . LAPAROSCOPIC TUBAL LIGATION Bilateral 06/13/2019   Procedure: LAPAROSCOPIC TUBAL LIGATION;  Surgeon: Chancy Milroy, MD;  Location: [facility];  Service: Gynecology;  Laterality: Bilateral;  FILSHIE CLIPS  . NASAL SINUS SURGERY    . WISDOM TOOTH EXTRACTION      Family Psychiatric History: Paternal grandmother depression Family History:  Family History  Problem Relation Age of Onset  . Healthy Father   . Healthy Mother   . Arthritis Maternal Grandmother   . Arthritis Maternal Grandfather   . Depression Paternal Grandmother     Social History:  Social History   Socioeconomic History  . Marital status: Married    Spouse name: Not on file  . Number of children: 0  . Years of education: 34  . Highest education level: Not on file  Occupational History  . Occupation: Unemployed  Tobacco Use  . Smoking status: Current Every Day Smoker    Packs/day: 0.50    Types: Cigarettes  . Smokeless tobacco: Never Used  Vaping Use  . Vaping Use: Every day  . Substances: Nicotine  Substance and Sexual Activity  . Alcohol use: Never    Alcohol/week: 0.0 standard drinks  . Drug use: No  . Sexual activity: Yes    Birth control/protection: Condom  Other Topics Concern  . Not on  file  Social History Narrative   Lives at home alone.   Right-handed.   3 glasses of green tea per day.   Social Determinants of Health   Financial Resource Strain:   . Difficulty of Paying Living Expenses:   Food Insecurity:   . Worried About Charity fundraiser in the Last Year:   . Arboriculturist in the Last Year:   Transportation Needs:   . Film/video editor (Medical):   Marland Kitchen Lack of Transportation (Non-Medical):   Physical Activity:   . Days of Exercise per Week:   . Minutes of Exercise per Session:   Stress:   . Feeling of Stress :   Social Connections:   . Frequency of Communication with Friends and Family:   . Frequency of Social Gatherings with Friends and Family:   . Attends Religious Services:   . Active Member of Clubs or Organizations:   . Attends Archivist  Meetings:   Marland Kitchen Marital Status:     Allergies:  Allergies  Allergen Reactions  . Keflex [Cephalexin] Anaphylaxis  . Flagyl [Metronidazole] Diarrhea    Metabolic Disorder Labs: No results found for: HGBA1C, MPG No results found for: PROLACTIN Lab Results  Component Value Date   TRIG 341 (H) 04/03/2019   No results found for: TSH  Therapeutic Level Labs: Lab Results  Component Value Date   LITHIUM <0.06 (L) 07/30/2019   LITHIUM <0.06 (L) 03/30/2019   No results found for: VALPROATE No components found for:  CBMZ  Current Medications: Current Outpatient Medications  Medication Sig Dispense Refill  . AJOVY 225 MG/1.5ML SOSY SMARTSIG:1 Syringe(s) SUB-Q    . ARIPiprazole (ABILIFY) 5 MG tablet Take 1 tablet (5 mg total) by mouth daily. 30 tablet 2  . beclomethasone (QVAR) 40 MCG/ACT inhaler Inhale 1 puff into the lungs 2 (two) times daily as needed (asthma).     . botulinum toxin Type A (BOTOX) 100 units SOLR injection Inject 200 Units into the muscle every 3 (three) months. Inject into head and neck muscles 2 each 3  . clonazePAM (KLONOPIN) 1 MG tablet Take 1 mg by mouth 4 (four)  times daily.     Marland Kitchen docusate sodium (COLACE) 100 MG capsule Take 100 mg by mouth every 12 (twelve) hours.    . Eszopiclone (ESZOPICLONE) 3 MG TABS Take 3 mg by mouth at bedtime. Take immediately before bedtime    . Fremanezumab-vfrm (AJOVY) 225 MG/1.5ML SOAJ Inject 225 mg into the skin every 30 (thirty) days. 1.5 mL 11  . gabapentin (NEURONTIN) 300 MG capsule Take 1 capsule (300 mg total) by mouth 3 (three) times daily as needed. 120 capsule 2  . hydrOXYzine (ATARAX/VISTARIL) 25 MG tablet Take 1 tablet (25 mg total) by mouth 3 (three) times daily as needed. 90 tablet 1  . ibuprofen (ADVIL) 800 MG tablet Take 1 tablet (800 mg total) by mouth every 8 (eight) hours as needed. 30 tablet 0  . levETIRAcetam (KEPPRA) 250 MG tablet Take 2 tablets (500 mg total) by mouth 2 (two) times daily. 120 tablet 5  . megestrol (MEGACE) 20 MG tablet Take 1 tablet (20 mg total) by mouth daily. Take 20 mg BID x 7 days. Then 20 mg qd 30 tablet 0  . metroNIDAZOLE (FLAGYL) 500 MG tablet Take 500 mg by mouth 2 (two) times daily.    . SUMAtriptan (IMITREX) 100 MG tablet TAKE 1 TABLET AT ONSET MAY REPEAT IN 2 HOURS IF NEEDED MAY DOSE 2 PER DAY OR 3 (Patient taking differently: Take 100 mg by mouth every 2 (two) hours as needed for migraine. MAY DOSE 2 PER DAY OR 3) 12 tablet 11  . tranexamic acid (LYSTEDA) 650 MG TABS tablet Take 2 tablets (1,300 mg total) by mouth 3 (three) times daily. 30 tablet 11  . traZODone (DESYREL) 50 MG tablet Take 1 tablet (50 mg total) by mouth at bedtime as needed for sleep. 30 tablet 2  . Vilazodone HCl (VIIBRYD) 20 MG TABS Take 1 tablet (20 mg total) by mouth daily. 30 tablet 2   No current facility-administered medications for this visit.     Musculoskeletal: Strength & Muscle Tone: within normal limits Gait & Station: normal Patient leans: N/A  Psychiatric Specialty Exam: Review of Systems  There were no vitals taken for this visit.There is no height or weight on file to calculate BMI.   General Appearance: Well Groomed  Eye Contact:  Good  Speech:  Clear and Coherent and Normal Rate  Volume:  Normal  Mood:  Anxious and Depressed  Affect:  Congruent  Thought Process:  Coherent, Goal Directed and Linear  Orientation:  Full (Time, Place, and Person)  Thought Content: WDL and Logical   Suicidal Thoughts:  No  Homicidal Thoughts:  No  Memory:  Immediate;   Good Recent;   Good Remote;   Good  Judgement:  Good  Insight:  Good  Psychomotor Activity:  Normal  Concentration:  Concentration: Good and Attention Span: Good  Recall:  Good  Fund of Knowledge: Good  Language: Good  Akathisia:  No  Handed:  Right  AIMS (if indicated):Not done  Assets:  Communication Skills Desire for Improvement Financial Resources/Insurance Housing Social Support  ADL's:  Intact  Cognition: WNL  Sleep:  Fair   Screenings: AIMS     Admission (Discharged) from 04/10/2017 in [facility]  AIMS Total Score 0    Mini-Mental     Office Visit from 10/05/2016 in [facility]  Total Score (max 30 points ) 26    PHQ2-9     ED [facility] 09/06/2019 in [facility]  PHQ-2 Total Score 1       Assessment and Plan: Patient reports that she has recently been experiencing painful right leg spasms and is concerned about signs of tardive dyskinesia.  Patient is agreeable to Austedo 6 mg BID and taper dose up to 15 mg BID over the next month. Sample pack given to patient. On follow-up if tolerated the dose will be adjusted to 18 mg BID (36 mg-threaputic dose).  Patient also reports that she has been feeling depressed because she had to put down her sick cat this past Monday.  As a result, she reports that she has been experiencing poor sleep, increased guilt, and racing thoughts about her cat.  Patient is agreeable to start Trazodone medication 25-50 mg PRN at bedtime to improve sleep at this time.  Patient denies any recent  hallucinations since starting Abilify medication at her last visit.  Patient is agreeable to continue all other medications as prescribed.  1. Bipolar I disorder, most recent episode depressed ([place])  Start- traZODone (DESYREL) 50 MG tablet; Take 1 tablet (50 mg total) by mouth at bedtime as needed for sleep.  Dispense: 30 tablet; Refill: 2 Continue- Vilazodone HCl (VIIBRYD) 20 MG TABS; Take 1 tablet (20 mg total) by mouth daily.  Dispense: 30 tablet; Refill: 2 Continue- gabapentin (NEURONTIN) 300 MG capsule; Take 1 capsule (300 mg total) by mouth 3 (three) times daily as needed.  Dispense: 120 capsule; Refill: 2 Continue- ARIPiprazole (ABILIFY) 5 MG tablet; Take 1 tablet (5 mg total) by mouth daily.  Dispense: 30 tablet; Refill: 2 -Start Austedo- Starter pack given to patient   Follow-up in 2 months. Follow-up with outpatient therapy as scheduled tomorrow 09/26/19.  Salley Slaughter, NP 09/25/2019, 1:21 PM

## 2019-09-25 NOTE — Telephone Encounter (Signed)
Colletta Maryland with [facility] called to state she was not able to access Ingrezza, that it has to come from a specialty pharmacy. Writer spoke with Brittney NP re this and she is willing to provide her samples initially to see if she will respond well to it and if she does then write a RX and send her to a different pharmacy to fill it, ie [facility]. Will call Aaylah to have her return to pick up samples.

## 2019-09-26 ENCOUNTER — Ambulatory Visit (INDEPENDENT_AMBULATORY_CARE_PROVIDER_SITE_OTHER): Payer: Medicaid Other | Admitting: Clinical

## 2019-09-26 DIAGNOSIS — F3130 Bipolar disorder, current episode depressed, mild or moderate severity, unspecified: Secondary | ICD-10-CM

## 2019-09-26 NOTE — Progress Notes (Signed)
   THERAPIST PROGRESS NOTE  Session Time: 53 minutes  Participation Level: Active  Behavioral Response: CasualAlertDepressed  Type of Therapy: Individual Therapy  Treatment Goals addressed: Coping  Interventions: Supportive  Summary:  Carmen Daniels is a 34 y.o. female who presents in person for the scheduled session. Client presented oriented times five, appropriately dressed, and friendly. Client denied hallucinations and delusions. Client met with the therapist for initial session today. Client reported on today she was feeling depressed because she has to make the choice to put her cat to sleep last week. Client reported she is feeling guilt from the view of "I mudered my cat". Client reported she has not been able to sleep or eat because of it.  Client reported previous hospitalizations for suicidal ideation with intent and plan. Clients most recent due to depression and self harm in July 2021. 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 Client reported a history of self harm by cutting on her arm and leg since the age of 32. Client reported it was not meant for intent to kill herself but as a way to release anxiety. Client reported following her hospitalization July 2021 they removed all knives from her home. Client reported her current stressors are conflictual relationships with her husband and her younger sister. Client reported  She and her husband are currently separated and reports her younger sister does not like her because of the stress she allegedly puts on their parents due to her mental health. Client stated, "keep to myself its hard to do simple things by myself".  Client reported her goal for therapy is to work on impulse control, work on grieving the loss of her pets, and work on improving interpersonal relationships.        Suicidal/Homicidal:  Nowithout intent/plan  Therapist Response:  Therapist initiated the first session with the client by making introductions and discussing confidentiality. Therapist asked open ended questions about how she has been feeling since the last session.  Therapist collaborated with the client to discuss her mental history and current symptoms. Therapist asked the client to identify therapeutic goals she'd like to work on. Therapist completed the treatment plans pertaining to depression, anxiety, and impulse control. Therapist addressed questions and concerns.     Plan: Return again in 3 weeks for individual therapy.  Diagnosis: Bipolar 1 disorder, current or most recent episode, depressed    [place], LCSW 09/26/2019

## 2019-09-27 ENCOUNTER — Encounter (HOSPITAL_COMMUNITY): Payer: Medicaid Other | Admitting: Psychiatry

## 2019-10-01 ENCOUNTER — Other Ambulatory Visit: Payer: Self-pay | Admitting: Obstetrics and Gynecology

## 2019-10-01 DIAGNOSIS — N939 Abnormal uterine and vaginal bleeding, unspecified: Secondary | ICD-10-CM

## 2019-10-08 ENCOUNTER — Other Ambulatory Visit: Payer: Self-pay

## 2019-10-08 ENCOUNTER — Telehealth: Payer: Self-pay

## 2019-10-08 DIAGNOSIS — N939 Abnormal uterine and vaginal bleeding, unspecified: Secondary | ICD-10-CM

## 2019-10-08 MED ORDER — MEGESTROL ACETATE 20 MG PO TABS: 20.0000 mg | ORAL_TABLET | Freq: Every day | ORAL | 3 refills | Status: DC

## 2019-10-08 NOTE — Progress Notes (Signed)
Rx refilled as advised per Dr.Ervin.

## 2019-10-08 NOTE — Telephone Encounter (Signed)
Called patient to let her know Megace #[id number] with 3 refills has been sent to her pharmacy, she voices understanding.

## 2019-10-17 ENCOUNTER — Ambulatory Visit (INDEPENDENT_AMBULATORY_CARE_PROVIDER_SITE_OTHER): Payer: Medicaid Other | Admitting: Obstetrics and Gynecology

## 2019-10-17 ENCOUNTER — Encounter: Payer: Self-pay | Admitting: Obstetrics and Gynecology

## 2019-10-17 ENCOUNTER — Other Ambulatory Visit: Payer: Self-pay

## 2019-10-17 VITALS — BP 124/83 | HR 69 | Wt 228.8 lb

## 2019-10-17 DIAGNOSIS — N939 Abnormal uterine and vaginal bleeding, unspecified: Secondary | ICD-10-CM | POA: Diagnosis not present

## 2019-10-17 NOTE — Patient Instructions (Signed)
Vaginal Hysterectomy, Care After Refer to this sheet in the next few weeks. These instructions provide you with information about caring for yourself after your procedure. Your health care provider may also give you more specific instructions. Your treatment has been planned according to current medical practices, but problems sometimes occur. Call your health care provider if you have any problems or questions after your procedure. What can I expect after the procedure? After the procedure, it is common to have:  Pain.  Soreness and numbness in your incision areas.  Vaginal bleeding and discharge.  Constipation.  Temporary problems emptying the bladder.  Feelings of sadness or other emotions. Follow these instructions at home: Medicines  Take over-the-counter and prescription medicines only as told by your health care provider.  If you were prescribed an antibiotic medicine, take it as told by your health care provider. Do not stop taking the antibiotic even if you start to feel better.  Do not drive or operate heavy machinery while taking prescription pain medicine. Activity  Return to your normal activities as told by your health care provider. Ask your health care provider what activities are safe for you.  Get regular exercise as told by your health care provider. You may be told to take short walks every day and go farther each time.  Do not lift anything that is heavier than 10 lb (4.5 kg). General instructions   Do not put anything in your vagina for 6 weeks after your surgery or as told by your health care provider. This includes tampons and douches.  Do not have sex until your health care provider says you can.  Do not take baths, swim, or use a hot tub until your health care provider approves.  Drink enough fluid to keep your urine clear or pale yellow.  Do not drive for 24 hours if you were given a sedative.  Keep all follow-up visits as told by your health  care provider. This is important. Contact a health care provider if:  Your pain medicine is not helping.  You have a fever.  You have redness, swelling, or pain at your incision site.  You have blood, pus, or a bad-smelling discharge from your vagina.  You continue to have difficulty urinating. Get help right away if:  You have severe abdominal or back pain.  You have heavy bleeding from your vagina.  You have chest pain or shortness of breath. This information is not intended to replace advice given to you by your health care provider. Make sure you discuss any questions you have with your health care provider. Document Revised: 10/02/2015 Document Reviewed: 02/23/2015 Elsevier Patient Education  2020 [employer]. Vaginal Hysterectomy  A vaginal hysterectomy is a procedure to remove all or part of the uterus through a small incision in the vagina. In this procedure, your health care provider may remove your entire uterus, including the lower end (cervix). You may need a vaginal hysterectomy to treat:  Uterine fibroids.  A condition that causes the lining of the uterus to grow in other areas (endometriosis).  Problems with pelvic support.  Cancer of the cervix, ovaries, uterus, or tissue that lines the uterus (endometrium).  Excessive (dysfunctional) uterine bleeding. When removing your uterus, your health care provider may also remove the organs that produce eggs (ovaries) and the tubes that carry eggs to your uterus (fallopian tubes). After a vaginal hysterectomy, you will no longer be able to have a baby. You will also no longer get your   menstrual period. Tell a health care provider about:  Any allergies you have.  All medicines you are taking, including vitamins, herbs, eye drops, creams, and over-the-counter medicines.  Any problems you or family members have had with anesthetic medicines.  Any blood disorders you have.  Any surgeries you have had.  Any medical  conditions you have.  Whether you are pregnant or may be pregnant. What are the risks? Generally, this is a safe procedure. However, problems may occur, including:  Bleeding.  Infection.  A blood clot that forms in your leg and travels to your lungs (pulmonary embolism).  Damage to surrounding organs.  Pain during sex. What happens before the procedure?  Ask your health care provider what organs will be removed during surgery.  Ask your health care provider about: ? Changing or stopping your regular medicines. This is especially important if you are taking diabetes medicines or blood thinners. ? Taking medicines such as aspirin and ibuprofen. These medicines can thin your blood. Do not take these medicines before your procedure if your health care provider instructs you not to.  Follow instructions from your health care provider about eating or drinking restrictions.  Do not use any tobacco products, such as cigarettes, chewing tobacco, and e-cigarettes. If you need help quitting, ask your health care provider.  Plan to have someone take you home after discharge from the hospital. What happens during the procedure?  To reduce your risk of infection: ? Your health care team will wash or sanitize their hands. ? Your skin will be washed with soap.  An IV tube will be inserted into one of your veins.  You may be given antibiotic medicine to help prevent infection.  You will be given one or more of the following: ? A medicine to help you relax (sedative). ? A medicine to numb the area (local anesthetic). ? A medicine to make you fall asleep (general anesthetic). ? A medicine that is injected into an area of your body to numb everything beyond the injection site (regional anesthetic).  Your surgeon will make an incision in your vagina.  Your surgeon will locate and remove all or part of your uterus.  Your ovaries and fallopian tubes may be removed at the same time.  The  incision will be closed with stitches (sutures) that dissolve over time. The procedure may vary among health care providers and hospitals. What happens after the procedure?  Your blood pressure, heart rate, breathing rate, and blood oxygen level will be monitored often until the medicines you were given have worn off.  You will be encouraged to get up and walk around after a few hours to help prevent complications.  You may have IV tubes in place for a few days.  You will be given pain medicine as needed.  Do not drive for 24 hours if you were given a sedative. This information is not intended to replace advice given to you by your health care provider. Make sure you discuss any questions you have with your health care provider. Document Revised: 10/02/2015 Document Reviewed: 02/23/2015 Elsevier Patient Education  2020 Elsevier Inc.  

## 2019-10-17 NOTE — Progress Notes (Signed)
Pt is here for follow up for AUB since BTL 05/2019. Pt reports she is still bleeding unless taking Megace.

## 2019-10-17 NOTE — Progress Notes (Signed)
Patient ID: Carmen Daniels, female   DOB: 1985/07/22, 34 y.o.   MRN: [id number] [name] presents with her mom to discuss her DUB Pt has noted more problems with her DUB since her tubal dispite use of TXA and Megace. She has tried an IUD in the past without help as well. She had a normal GYN U/S 12/20 She desires definite therapy  SAB x 1  PE AF VSS Lungs clear Heart RRR Abd soft + BS  GU nl EGBUS uterus small mobile non tender no masses  A/P DUB refactory to medication  Endometrial ablation and hysterectomy reviewed with pt. Pt desires hyst TVH with possible salpingectomy discussed. Information provided. Hyst papers signed today. R/B/Post care discussed Will f/u with post op appt. Continue with Megace til surgery

## 2019-10-28 NOTE — H&P (Signed)
Carmen Daniels is an 34 [id number] female with DUB refactory to medical therapy. H/O BTL. Normal GYN U/S. Pt desires definite therpay.  SAB x 1  Menstrual History: Menarche age: 34 No LMP recorded. (Menstrual status: Irregular Periods).    Past Medical History:  Diagnosis Date  . Acute respiratory failure with hypoxia ([place])   . Acute rhinosinusitis 07/03/2018  . Allergic rhinitis   . Anxiety   . Asthma   . Back pain   . Bipolar disorder ([place])   . Borderline personality disorder ([place])   . Bupropion overdose   . Chronic pain   . Chronic pain syndrome   . Depression   . Hallucinations   . Migraines   . Nasal polyps   . Nausea   . Neck pain   . OCD (obsessive compulsive disorder)   . OCD (obsessive compulsive disorder)   . Ovarian cyst    left    Past Surgical History:  Procedure Laterality Date  . LAPAROSCOPIC TUBAL LIGATION Bilateral 06/13/2019   Procedure: LAPAROSCOPIC TUBAL LIGATION;  Surgeon: Chancy Milroy, MD;  Location: [facility];  Service: Gynecology;  Laterality: Bilateral;  FILSHIE CLIPS  . NASAL SINUS SURGERY    . WISDOM TOOTH EXTRACTION      Family History  Problem Relation Age of Onset  . Healthy Father   . Healthy Mother   . Arthritis Maternal Grandmother   . Arthritis Maternal Grandfather   . Depression Paternal Grandmother     Social History:  reports that she has been smoking cigarettes. She has been smoking about 0.50 packs per day. She has never used smokeless tobacco. She reports that she does not drink alcohol and does not use drugs.  Allergies:  Allergies  Allergen Reactions  . Keflex [Cephalexin] Anaphylaxis  . Flagyl [Metronidazole] Diarrhea    No medications prior to admission.    Review of Systems  Constitutional: Negative.   Respiratory: Negative.   Cardiovascular: Negative.   Gastrointestinal: Negative.   Genitourinary: Positive for menstrual problem.    There were no vitals taken for this  visit. Physical Exam Constitutional:      Appearance: Normal appearance.  Cardiovascular:     Rate and Rhythm: Normal rate and regular rhythm.     Heart sounds: Normal heart sounds.  Pulmonary:     Effort: Pulmonary effort is normal.     Breath sounds: Normal breath sounds.  Abdominal:     General: Bowel sounds are normal.     Palpations: Abdomen is soft.  Genitourinary:    Comments: Nl EGBUS, uterus < 8 weeks, mobile, no adnexal masses Musculoskeletal:     Cervical back: Normal range of motion and neck supple.  Neurological:     Mental Status: She is alert.     No results found for this or any previous visit (from the past 24 hour(s)).  No results found.  Assessment/Plan: DUB refactory to medical therapy  Pt desires definite therapy. TVH with possible salpingectomy reviewed with pt. R/B/Post op care discussed. Pt verbalized understanding and agrees to proceed.     Chancy Milroy 10/28/2019, 7:57 AM

## 2019-11-02 ENCOUNTER — Inpatient Hospital Stay (HOSPITAL_COMMUNITY): Admission: RE | Admit: 2019-11-02 | Payer: Medicaid Other | Source: Ambulatory Visit

## 2019-11-02 ENCOUNTER — Ambulatory Visit (HOSPITAL_COMMUNITY): Payer: Medicaid Other | Admitting: Clinical

## 2019-11-07 ENCOUNTER — Other Ambulatory Visit (HOSPITAL_COMMUNITY): Payer: Self-pay | Admitting: Psychiatry

## 2019-11-07 DIAGNOSIS — F4010 Social phobia, unspecified: Secondary | ICD-10-CM

## 2019-11-14 ENCOUNTER — Telehealth (HOSPITAL_COMMUNITY): Payer: Self-pay | Admitting: *Deleted

## 2019-11-14 ENCOUNTER — Other Ambulatory Visit (HOSPITAL_COMMUNITY): Payer: Self-pay | Admitting: Psychiatry

## 2019-11-14 DIAGNOSIS — F3130 Bipolar disorder, current episode depressed, mild or moderate severity, unspecified: Secondary | ICD-10-CM

## 2019-11-14 DIAGNOSIS — G2401 Drug induced subacute dyskinesia: Secondary | ICD-10-CM | POA: Insufficient documentation

## 2019-11-14 MED ORDER — AUSTEDO 9 MG PO TABS: 18.0000 mg | ORAL_TABLET | Freq: Two times a day (BID) | ORAL | 2 refills | Status: DC

## 2019-11-14 NOTE — Telephone Encounter (Signed)
Call from patients mom, states she has completed the sample pack of Austedo and now unsure what is next. No apparent RX. Will call her back after I have consulted with Ms Ronne Binning NP re concern.

## 2019-11-15 ENCOUNTER — Telehealth (HOSPITAL_COMMUNITY): Payer: Self-pay | Admitting: *Deleted

## 2019-11-15 NOTE — Telephone Encounter (Signed)
Completed request for Austedo for a prior authorization. Waiting now for an outcome from [facility].

## 2019-11-16 ENCOUNTER — Ambulatory Visit (HOSPITAL_COMMUNITY): Payer: Self-pay | Admitting: Clinical

## 2019-11-16 ENCOUNTER — Other Ambulatory Visit: Payer: Self-pay | Admitting: Neurology

## 2019-11-19 ENCOUNTER — Telehealth (HOSPITAL_COMMUNITY): Payer: Self-pay | Admitting: *Deleted

## 2019-11-19 NOTE — Telephone Encounter (Signed)
Trying to complete prior authorization for Austedo. Denied first time, needing the [employer] form to resubmit it

## 2019-11-21 ENCOUNTER — Telehealth (HOSPITAL_COMMUNITY): Payer: Self-pay | Admitting: *Deleted

## 2019-11-21 NOTE — Telephone Encounter (Signed)
Austedo approved, no further documentation needed.

## 2019-11-21 NOTE — Telephone Encounter (Signed)
Thank you :)

## 2019-11-21 NOTE — Telephone Encounter (Signed)
Approved for [place], [place] # [id number]. This med will require a specialty pharmacy. Will make Dr aware of drugs approval.

## 2019-11-26 ENCOUNTER — Other Ambulatory Visit: Payer: Self-pay

## 2019-11-26 ENCOUNTER — Encounter (HOSPITAL_COMMUNITY): Payer: Self-pay | Admitting: Psychiatry

## 2019-11-26 ENCOUNTER — Ambulatory Visit (INDEPENDENT_AMBULATORY_CARE_PROVIDER_SITE_OTHER): Payer: Medicaid Other | Admitting: Psychiatry

## 2019-11-26 DIAGNOSIS — F3130 Bipolar disorder, current episode depressed, mild or moderate severity, unspecified: Secondary | ICD-10-CM

## 2019-11-26 DIAGNOSIS — G2401 Drug induced subacute dyskinesia: Secondary | ICD-10-CM | POA: Diagnosis not present

## 2019-11-26 DIAGNOSIS — F4010 Social phobia, unspecified: Secondary | ICD-10-CM | POA: Diagnosis not present

## 2019-11-26 MED ORDER — HYDROXYZINE HCL 25 MG PO TABS: 25.0000 mg | ORAL_TABLET | Freq: Three times a day (TID) | ORAL | 2 refills | Status: DC | PRN

## 2019-11-26 MED ORDER — GABAPENTIN 300 MG PO CAPS: 300.0000 mg | ORAL_CAPSULE | Freq: Three times a day (TID) | ORAL | 2 refills | Status: DC | PRN

## 2019-11-26 MED ORDER — ARIPIPRAZOLE 5 MG PO TABS: 5.0000 mg | ORAL_TABLET | Freq: Every day | ORAL | 2 refills | Status: DC

## 2019-11-26 MED ORDER — AUSTEDO 9 MG PO TABS: 18.0000 mg | ORAL_TABLET | Freq: Two times a day (BID) | ORAL | 2 refills | Status: DC

## 2019-11-26 MED ORDER — VIIBRYD 20 MG PO TABS: 20.0000 mg | ORAL_TABLET | Freq: Every day | ORAL | 2 refills | Status: DC

## 2019-11-26 NOTE — Progress Notes (Signed)
BH MD/PA/NP OP Progress Note  11/26/2019 4:10 PM KIMBERLE STANFILL  MRN:  [id number]  Chief Complaint: "I'm doing good" Chief Complaint    Medication Management      HPI: 34 year old female seen today for follow-up psychiatric evaluation and medication management. She has a psychiatric history of bipolar disorder, borderline personality disorder, anxiety, depression, and SI/SA. She is currently managed on starting Abilify 5 mg daily, vybrid 20, trazodone 25 mg-50 mg as needed,and hydroxyzine 25 mg three times daily as needed. She notes these medications are effective in managing her psychiatric conditions.   Today she is well-groomed, pleasant, cooperative, engaged in conversation, and maintained eye contact.  She denies symptoms of anxiety and depression.  She notes that she still grieves her cat's [name] however reports that she is coping with her death well.  She notes that she will be moving out of her mother's home and into her own apartment.  She states that this should be beneficial for her because she will not be in the area where her cat died or she almost died.  She denies SI/HI/AVH or paranoia.  Patient informed provider that since starting Austedo her tardive dyskinesia has improved.  She notes that she still has some abnormal movements in her legs however reports that it is manageable.  Provider conducted an aims assessment and patient scored a 5.  No medication adjustments made today.  Patient is agreeable to continue medications as prescribed.  She will follow up with provider in 3 months and with outpatient therapist.  No other concerns noted at this time.  Visit Diagnosis:    ICD-10-CM   1. Bipolar I disorder, most recent episode depressed ([place])  F31.30   2. Tardive dyskinesia  G24.01   3. Social anxiety disorder  F40.10     Past Psychiatric History: bipolar disorder, borderline personality disorder, anxiety, depression, and SI/SA Past Medical History:  Past Medical History:   Diagnosis Date  . Acute respiratory failure with hypoxia ([place])   . Acute rhinosinusitis 07/03/2018  . Allergic rhinitis   . Anxiety   . Asthma   . Back pain   . Bipolar disorder ([place])   . Borderline personality disorder ([place])   . Bupropion overdose   . Chronic pain   . Chronic pain syndrome   . Depression   . Hallucinations   . Migraines   . Nasal polyps   . Nausea   . Neck pain   . OCD (obsessive compulsive disorder)   . OCD (obsessive compulsive disorder)   . Ovarian cyst    left    Past Surgical History:  Procedure Laterality Date  . LAPAROSCOPIC TUBAL LIGATION Bilateral 06/13/2019   Procedure: LAPAROSCOPIC TUBAL LIGATION;  Surgeon: Chancy Milroy, MD;  Location: [place] [facility];  Service: Gynecology;  Laterality: Bilateral;  FILSHIE CLIPS  . NASAL SINUS SURGERY    . WISDOM TOOTH EXTRACTION      Family Psychiatric History: Paternal grandmother depression Family History:  Family History  Problem Relation Age of Onset  . Healthy Father   . Healthy Mother   . Arthritis Maternal Grandmother   . Arthritis Maternal Grandfather   . Depression Paternal Grandmother     Social History:  Social History   Socioeconomic History  . Marital status: Married    Spouse name: Not on file  . Number of children: 0  . Years of education: 20  . Highest education level: Not on file  Occupational History  . Occupation: Unemployed  Tobacco Use  . Smoking status: Current Every Day Smoker    Packs/day: 0.50    Types: Cigarettes  . Smokeless tobacco: Never Used  Vaping Use  . Vaping Use: Every day  . Substances: Nicotine  Substance and Sexual Activity  . Alcohol use: Never    Alcohol/week: 0.0 standard drinks  . Drug use: No  . Sexual activity: Yes    Birth control/protection: Condom  Other Topics Concern  . Not on file  Social History Narrative   Lives at home alone.   Right-handed.   3 glasses of green tea per day.   Social Determinants of Health    Financial Resource Strain:   . Difficulty of Paying Living Expenses: Not on file  Food Insecurity:   . Worried About Charity fundraiser in the Last Year: Not on file  . Ran Out of Food in the Last Year: Not on file  Transportation Needs:   . Lack of Transportation (Medical): Not on file  . Lack of Transportation (Non-Medical): Not on file  Physical Activity:   . Days of Exercise per Week: Not on file  . Minutes of Exercise per Session: Not on file  Stress:   . Feeling of Stress : Not on file  Social Connections:   . Frequency of Communication with Friends and Family: Not on file  . Frequency of Social Gatherings with Friends and Family: Not on file  . Attends Religious Services: Not on file  . Active Member of Clubs or Organizations: Not on file  . Attends Archivist Meetings: Not on file  . Marital Status: Not on file    Allergies:  Allergies  Allergen Reactions  . Keflex [Cephalexin] Anaphylaxis  . Flagyl [Metronidazole] Diarrhea    Metabolic Disorder Labs: No results found for: HGBA1C, MPG No results found for: PROLACTIN Lab Results  Component Value Date   TRIG 341 (H) 04/03/2019   No results found for: TSH  Therapeutic Level Labs: Lab Results  Component Value Date   LITHIUM <0.06 (L) 07/30/2019   LITHIUM <0.06 (L) 03/30/2019   No results found for: VALPROATE No components found for:  CBMZ  Current Medications: Current Outpatient Medications  Medication Sig Dispense Refill  . AJOVY 225 MG/1.5ML SOSY INJECT THE CONTENTS OF 1 SYRINGE ONCE EVERY 30 DAYS 1.5 mL 11  . ARIPiprazole (ABILIFY) 5 MG tablet Take 1 tablet (5 mg total) by mouth daily. 30 tablet 2  . beclomethasone (QVAR) 40 MCG/ACT inhaler Inhale 1 puff into the lungs 2 (two) times daily as needed (asthma).     . botulinum toxin Type A (BOTOX) 100 units SOLR injection Inject 200 Units into the muscle every 3 (three) months. Inject into head and neck muscles 2 each 3  . clonazePAM (KLONOPIN)  1 MG tablet Take 1 mg by mouth 4 (four) times daily.     . Deutetrabenazine (AUSTEDO) 9 MG TABS Take 18 mg by mouth 2 (two) times daily. 60 tablet 2  . docusate sodium (COLACE) 100 MG capsule Take 100 mg by mouth every 12 (twelve) hours.    . Eszopiclone (ESZOPICLONE) 3 MG TABS Take 3 mg by mouth at bedtime. Take immediately before bedtime    . Fremanezumab-vfrm (AJOVY) 225 MG/1.5ML SOAJ Inject 225 mg into the skin every 30 (thirty) days. 1.5 mL 11  . gabapentin (NEURONTIN) 300 MG capsule Take 1 capsule (300 mg total) by mouth 3 (three) times daily as needed. 120 capsule 2  . hydrOXYzine (ATARAX/VISTARIL) 25  MG tablet TAKE 1 TABLET BY MOUTH 3 TIMES DAILY AS NEEDED. 90 tablet 1  . ibuprofen (ADVIL) 800 MG tablet Take 1 tablet (800 mg total) by mouth every 8 (eight) hours as needed. 30 tablet 0  . levETIRAcetam (KEPPRA) 250 MG tablet Take 2 tablets (500 mg total) by mouth 2 (two) times daily. (Patient not taking: Reported on 10/17/2019) 120 tablet 5  . megestrol (MEGACE) 20 MG tablet Take 1 tablet (20 mg total) by mouth daily. Take 20 mg BID x 7 days. Then 20 mg qd 60 tablet 3  . metroNIDAZOLE (FLAGYL) 500 MG tablet Take 500 mg by mouth 2 (two) times daily. (Patient not taking: Reported on 10/17/2019)    . SUMAtriptan (IMITREX) 100 MG tablet TAKE 1 TABLET AT ONSET MAY REPEAT IN 2 HOURS IF NEEDED MAY DOSE 2 PER DAY OR 3 (Patient taking differently: Take 100 mg by mouth every 2 (two) hours as needed for migraine. MAY DOSE 2 PER DAY OR 3) 12 tablet 11  . tranexamic acid (LYSTEDA) 650 MG TABS tablet Take 2 tablets (1,300 mg total) by mouth 3 (three) times daily. (Patient not taking: Reported on 10/17/2019) 30 tablet 11  . traZODone (DESYREL) 50 MG tablet Take 1 tablet (50 mg total) by mouth at bedtime as needed for sleep. 30 tablet 2  . Vilazodone HCl (VIIBRYD) 20 MG TABS Take 1 tablet (20 mg total) by mouth daily. 30 tablet 2   No current facility-administered medications for this visit.      Musculoskeletal: Strength & Muscle Tone: within normal limits Gait & Station: normal Patient leans: N/A  Psychiatric Specialty Exam: Review of Systems  Blood pressure (!) 145/83, pulse 83, temperature 98.5 F (36.9 C), temperature source Oral, height 5\' 11"  (1.803 m), weight 231 lb (104.8 kg), SpO2 100 %.Body mass index is 32.22 kg/m.  General Appearance: Well Groomed  Eye Contact:  Good  Speech:  Clear and Coherent and Normal Rate  Volume:  Normal  Mood:  Euthymic  Affect:  Congruent  Thought Process:  Coherent, Goal Directed and Linear  Orientation:  Full (Time, Place, and Person)  Thought Content: WDL and Logical   Suicidal Thoughts:  No  Homicidal Thoughts:  No  Memory:  Immediate;   Good Recent;   Good Remote;   Good  Judgement:  Good  Insight:  Good  Psychomotor Activity:  Normal  Concentration:  Concentration: Good and Attention Span: Good  Recall:  Good  Fund of Knowledge: Good  Language: Good  Akathisia:  No  Handed:  Right  AIMS (if indicated):Done  Assets:  Communication Skills Desire for Improvement Financial Resources/Insurance Housing Social Support  ADL's:  Intact  Cognition: WNL  Sleep:  Good   Screenings: AIMS     Admission (Discharged) from 04/10/2017 in [facility]  AIMS Total Score 0    Mini-Mental     Office Visit from 10/05/2016 in [facility]  Total Score (max 30 points ) 26    PHQ2-9     ED from 09/06/2019 in [facility]  PHQ-2 Total Score 1       Assessment and Plan: Patient reports that she is doing well on her current medication regimen.  No medication adjustments made today.  Patient is agreeable to continue medications as prescribed.  1. Bipolar I disorder, most recent episode depressed ([place])  - Ambulatory referral to Social Work Continue- ARIPiprazole (ABILIFY) 5 MG tablet; Take 1 tablet (5 mg  total) by mouth daily.  Dispense: 30 tablet;  Refill: 2 Continue- gabapentin (NEURONTIN) 300 MG capsule; Take 1 capsule (300 mg total) by mouth 3 (three) times daily as needed.  Dispense: 120 capsule; Refill: 2  2. Tardive dyskinesia  Continue- Deutetrabenazine (AUSTEDO) 9 MG TABS; Take 18 mg by mouth 2 (two) times daily.  Dispense: 60 tablet; Refill: 2  3. Social anxiety disorder  Continue- hydrOXYzine (ATARAX/VISTARIL) 25 MG tablet; Take 1 tablet (25 mg total) by mouth 3 (three) times daily as needed.  Dispense: 90 tablet; Refill: 2    Follow-up in 3 months. Follow-up with outpatient therapy   Salley Slaughter, NP 11/26/2019, 4:10 PM

## 2019-11-29 ENCOUNTER — Telehealth: Payer: Self-pay | Admitting: Neurology

## 2019-11-29 NOTE — Telephone Encounter (Signed)
I called [facility] to set up Botox delivery for 10/20 appointment. I spoke with Celine who was able to schedule delivery for 10/12.

## 2019-11-30 ENCOUNTER — Ambulatory Visit (HOSPITAL_COMMUNITY): Payer: Self-pay | Admitting: Clinical

## 2019-12-04 NOTE — Telephone Encounter (Signed)
(  1) 200U vial of Botox delivered today from [place].

## 2019-12-06 ENCOUNTER — Other Ambulatory Visit: Payer: Self-pay

## 2019-12-06 ENCOUNTER — Telehealth: Payer: Self-pay

## 2019-12-06 DIAGNOSIS — N939 Abnormal uterine and vaginal bleeding, unspecified: Secondary | ICD-10-CM

## 2019-12-06 MED ORDER — NORETHINDRONE ACETATE 5 MG PO TABS: 5.0000 mg | ORAL_TABLET | Freq: Every day | ORAL | 2 refills | Status: DC

## 2019-12-06 NOTE — Progress Notes (Signed)
Rx for aygestin sent to pt's pharmacy, pt made aware.

## 2019-12-06 NOTE — Telephone Encounter (Signed)
Pt's mother called and reports that the patient has been taking Megace as prescribed, however her vaginal bleeding is still occurring. Pt would like to know what to do about the bleeding until her surgery on 11/23, please advise.

## 2019-12-06 NOTE — Telephone Encounter (Signed)
Discontinue Megace and start Aygestin 5 mg po qd until surgery.  Thanks Legrand Como

## 2019-12-06 NOTE — Telephone Encounter (Signed)
S/w patient and advised her of recommendations and rx sent to pharmacy, she voices understanding.

## 2019-12-12 ENCOUNTER — Other Ambulatory Visit: Payer: Self-pay

## 2019-12-12 ENCOUNTER — Other Ambulatory Visit (HOSPITAL_COMMUNITY): Payer: Self-pay | Admitting: Psychiatry

## 2019-12-12 ENCOUNTER — Ambulatory Visit: Payer: Medicaid Other | Admitting: Neurology

## 2019-12-12 ENCOUNTER — Encounter: Payer: Self-pay | Admitting: Neurology

## 2019-12-12 VITALS — BP 128/83 | HR 91 | Ht 71.0 in | Wt 234.0 lb

## 2019-12-12 DIAGNOSIS — F3130 Bipolar disorder, current episode depressed, mild or moderate severity, unspecified: Secondary | ICD-10-CM

## 2019-12-12 DIAGNOSIS — G43719 Chronic migraine without aura, intractable, without status migrainosus: Secondary | ICD-10-CM

## 2019-12-12 MED ORDER — AJOVY 225 MG/1.5ML ~~LOC~~ SOAJ: 225.0000 mg | SUBCUTANEOUS | 11 refills | Status: DC

## 2019-12-12 MED ORDER — SUMATRIPTAN SUCCINATE 100 MG PO TABS: 100.0000 mg | ORAL_TABLET | ORAL | 11 refills | Status: DC | PRN

## 2019-12-12 NOTE — Progress Notes (Signed)
**  Botox 200 unit vial x 1, [place] 2919-1660-60, Lot [id number], Exp 05/2022, specialty pharmacy.//mck,rn**

## 2019-12-12 NOTE — Progress Notes (Signed)
PATIENT: Carmen Daniels DOB: February 14, 1986  Chief Complaint  Patient presents with  . Migraine    Botox  . Seizures    She stopped Keppra mid-August. No further seizure-like activity.     HISTORICAL  Carmen Daniels has of being a patient of our office for many years for chronic migraine headaches, she also has history of bipolar disorder, today she came in with her mother to follow-up her hospital discharge in February 2021 for polypharmacy overdose, status epilepticus  She was admitted to the hospital on March 28, 2019 with intentional overdose, this including Wellbutrin, Ambien, Flexeril, then she was noted to have intermittent whole body shaking, progressed to have seizure-like activity, was intubated on March 29, 2019, was loaded with Versed, Dilantin, she was noted to have 10 to 15 seconds of seizure activity followed by quiescence.  Then she was loaded with IV Keppra, midazolam, followed by propofol drip, which stopped the clinical seizure activity, later continuous EEG showed diffuse continuous slowing, system with severe encephalopathy, she was seen by epileptologist Dr. Hortense Ramal on April 05, 2019, her seizure were likely provoked in the setting of Wellbutrin overdose, most likely she will not require long-term antiemetic medications, continue seizure precaution, gradually wean off Keppra,  Her hospital course was also complicated by aspiration pneumonia, right hand cellulitis, was treated with Levaquin, Flagyl.  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,  Continuous EEG monitoring showed severe diffuse encephalopathy, continuous generalized slowing,  Laboratory evaluation on March 28, 2019 showed positive for benzodiazepine, lithium level was 0.11, plasma lactic acid was 4.1,  Most recent laboratory  evaluation in February 2021, CBC showed mild anemia hemoglobin of 11.3, BMP showed no significant abnormalities  She now complains of short-term memory loss, had no recurrent seizure-like activity, has moved in with her parents, also complains of increased migraine headaches,  She began to receive Botox injection as migraine prevention since March 2017, most recent injection was in December 2020  She previously on polypharmacy for her mood disorder, now only on Zyprexa 50 mg at bedtime  UPDATE June 08 2019: Her EEG was normal on May 31, 2019.  She does not have recurrent seizure, she did have status epilepticus during her hospital admission on March 28, 2019 due to polypharmacy overdose, she is not taking Keppra 750 mg twice a day, I have suggested her for total treatment of 6 months, stepping down to Keppra 500 mg for 2 months, then 250 mg for 2 months, then stop,  She complains of frequent headaches, used up all her monthly Imitrex supply, came in for Botox injection today  UPDATE September 05 2019: She responded well to previous injection, noticed increased headache at the end of the injection cycle  UPDATE Dec 12 2019:  She had no headache at the end of the injection cycle REVIEW OF SYSTEMS: Full 14 system review of systems performed and notable only for as above All other review of systems were negative.  ALLERGIES: Allergies  Allergen Reactions  . Keflex [Cephalexin] Anaphylaxis  . Flagyl [Metronidazole] Diarrhea    HOME MEDICATIONS: Current Outpatient Medications  Medication Sig Dispense Refill  . ARIPiprazole (ABILIFY) 5 MG tablet Take 1 tablet (5 mg total) by mouth daily. 30 tablet 2  . beclomethasone (QVAR) 40 MCG/ACT inhaler Inhale 1 puff into the  lungs 2 (two) times daily as needed (asthma).     . botulinum toxin Type A (BOTOX) 100 units SOLR injection Inject 200 Units into the muscle every 3 (three) months. Inject into head and neck muscles 2 each 3  . clonazePAM  (KLONOPIN) 1 MG tablet Take 1 mg by mouth 4 (four) times daily.     . Deutetrabenazine (AUSTEDO) 9 MG TABS Take 18 mg by mouth 2 (two) times daily. 60 tablet 2  . docusate sodium (COLACE) 100 MG capsule Take 100 mg by mouth every 12 (twelve) hours.    . Eszopiclone (ESZOPICLONE) 3 MG TABS Take 3 mg by mouth at bedtime. Take immediately before bedtime    . Fremanezumab-vfrm (AJOVY) 225 MG/1.5ML SOAJ Inject 225 mg into the skin every 30 (thirty) days. 1.5 mL 11  . gabapentin (NEURONTIN) 300 MG capsule Take 1 capsule (300 mg total) by mouth 3 (three) times daily as needed. 120 capsule 2  . hydrOXYzine (ATARAX/VISTARIL) 25 MG tablet Take 1 tablet (25 mg total) by mouth 3 (three) times daily as needed. 90 tablet 2  . ibuprofen (ADVIL) 800 MG tablet Take 1 tablet (800 mg total) by mouth every 8 (eight) hours as needed. 30 tablet 0  . norethindrone (AYGESTIN) 5 MG tablet Take 1 tablet (5 mg total) by mouth daily. 30 tablet 2  . SUMAtriptan (IMITREX) 100 MG tablet TAKE 1 TABLET AT ONSET MAY REPEAT IN 2 HOURS IF NEEDED MAY DOSE 2 PER DAY OR 3 (Patient taking differently: Take 100 mg by mouth every 2 (two) hours as needed for migraine. MAY DOSE 2 PER DAY OR 3) 12 tablet 11  . traZODone (DESYREL) 50 MG tablet TAKE 1 TABLET BY MOUTH AT BEDTIME AS NEEDED FOR SLEEP. 30 tablet 2  . Vilazodone HCl (VIIBRYD) 20 MG TABS Take 1 tablet (20 mg total) by mouth daily. 30 tablet 2   No current facility-administered medications for this visit.    PAST MEDICAL HISTORY: Past Medical History:  Diagnosis Date  . Acute respiratory failure with hypoxia ([place])   . Acute rhinosinusitis 07/03/2018  . Allergic rhinitis   . Anxiety   . Asthma   . Back pain   . Bipolar disorder ([place])   . Borderline personality disorder ([place])   . Bupropion overdose   . Chronic pain   . Chronic pain syndrome   . Depression   . Hallucinations   . Migraines   . Nasal polyps   . Nausea   . Neck pain   . OCD (obsessive compulsive disorder)    . OCD (obsessive compulsive disorder)   . Ovarian cyst    left    PAST SURGICAL HISTORY: Past Surgical History:  Procedure Laterality Date  . LAPAROSCOPIC TUBAL LIGATION Bilateral 06/13/2019   Procedure: LAPAROSCOPIC TUBAL LIGATION;  Surgeon: Chancy Milroy, MD;  Location: [place];  Service: Gynecology;  Laterality: Bilateral;  FILSHIE CLIPS  . NASAL SINUS SURGERY    . WISDOM TOOTH EXTRACTION      FAMILY HISTORY: Family History  Problem Relation Age of Onset  . Healthy Father   . Healthy Mother   . Arthritis Maternal Grandmother   . Arthritis Maternal Grandfather   . Depression Paternal Grandmother     SOCIAL HISTORY: Social History   Socioeconomic History  . Marital status: Married    Spouse name: Not on file  . Number of children: 0  . Years of education: 80  . Highest education level: Not on file  Occupational History  . Occupation: Unemployed  Tobacco Use  . Smoking status: Current Every Day Smoker    Packs/day: 0.50    Types: Cigarettes  . Smokeless tobacco: Never Used  Vaping Use  . Vaping Use: Every day  . Substances: Nicotine  Substance and Sexual Activity  . Alcohol use: Never    Alcohol/week: 0.0 standard drinks  . Drug use: No  . Sexual activity: Yes    Birth control/protection: Condom  Other Topics Concern  . Not on file  Social History Narrative   Lives at home alone.   Right-handed.   3 glasses of green tea per day.   Social Determinants of Health   Financial Resource Strain:   . Difficulty of Paying Living Expenses: Not on file  Food Insecurity:   . Worried About Charity fundraiser in the Last Year: Not on file  . Ran Out of Food in the Last Year: Not on file  Transportation Needs:   . Lack of Transportation (Medical): Not on file  . Lack of Transportation (Non-Medical): Not on file  Physical Activity:   . Days of Exercise per Week: Not on file  . Minutes of Exercise per Session: Not on file  Stress:   .  Feeling of Stress : Not on file  Social Connections:   . Frequency of Communication with Friends and Family: Not on file  . Frequency of Social Gatherings with Friends and Family: Not on file  . Attends Religious Services: Not on file  . Active Member of Clubs or Organizations: Not on file  . Attends Archivist Meetings: Not on file  . Marital Status: Not on file  Intimate Partner Violence:   . Fear of Current or Ex-Partner: Not on file  . Emotionally Abused: Not on file  . Physically Abused: Not on file  . Sexually Abused: Not on file     PHYSICAL EXAM   Vitals:   12/12/19 1425  BP: 128/83  Pulse: 91  Weight: 234 lb (106.1 kg)  Height: 5\' 11"  (1.803 m)   Not recorded     Body mass index is 32.64 kg/m.  PHYSICAL EXAMNIATION:  Depressed looking young female, alert oriented, to history taking and care of conversation,   DIAGNOSTIC DATA (LABS, IMAGING, TESTING) - I reviewed patient records, labs, notes, testing and imaging myself where available.   ASSESSMENT AND PLAN  Carmen Daniels is a 35 y.o. 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  Call clinic for recurrent event  Chronic migraine headaches  Imitrex 100 mg as needed, limit the use to less than 3 times each week, may combine with NSAIDs, Zofran  Botox injection for chronic migraine prevention, injection was performed according to [place] protocol,  5 units of Botox was injected into each side, for 31 injection sites, total of 155 units  Bilateral frontalis 4 injection sites Bilateral corrugate 2 injection sites Procerus 1 injection  sites. Bilateral temporalis 8 injection sites Bilateral occipitalis 6 injection sites Bilateral cervical paraspinals 4 injection sites Bilateral upper trapezius 6 injection sites  Extra 45 unites  were injected into bilateral upper cervical paraspinals, masseters  Marcial Pacas, M.D. Ph.D.  [facility] [address], [place], [place] [address] Ph: [phone] Fax: [phone]  CC: Referring Provider

## 2019-12-25 ENCOUNTER — Telehealth: Payer: Self-pay

## 2019-12-25 NOTE — Telephone Encounter (Signed)
TC from pt mother stating Rx Aygestin 5 mg is not working. Pt still bleeding everyday w/clots Pt mother states they were told before surgery is done it was recommended vaginal bleeding be stopped  Pt mother would like advice on what to do .

## 2019-12-26 ENCOUNTER — Other Ambulatory Visit: Payer: Self-pay

## 2019-12-26 ENCOUNTER — Telehealth: Payer: Self-pay

## 2019-12-26 DIAGNOSIS — N939 Abnormal uterine and vaginal bleeding, unspecified: Secondary | ICD-10-CM

## 2019-12-26 MED ORDER — NORETHINDRONE ACETATE 5 MG PO TABS: 10.0000 mg | ORAL_TABLET | Freq: Every day | ORAL | 1 refills | Status: DC

## 2019-12-26 NOTE — Telephone Encounter (Signed)
Return call to pt following triage vm left by pt mother CC: vaginal bleeding still even with current medication management. Pt made aware I consulted with Dr.Ervin provider advice below:  "Increase Aygestin to 10 mg daily Thanks" Legrand Como   Rx sent pt made aware and  voiced understanding.

## 2019-12-26 NOTE — Progress Notes (Signed)
Rx sent as advised with dosage increase For vaginal bleeding  per Dr.Ervin Pt made aware and voiced understanding.

## 2019-12-26 NOTE — Telephone Encounter (Signed)
Increase Aygestin to 10 mg daily Thanks Legrand Como

## 2020-01-01 NOTE — H&P (Signed)
Carmen Daniels is an 34 y.o. female G1P0010 with DUB refractory to medication. GYN U/S 12/20 normal. She desires definite therapy.    Menstrual History: Menarche age: 72 No LMP recorded. (Menstrual status: Irregular Periods).    Past Medical History:  Diagnosis Date  . Acute respiratory failure with hypoxia ([place])   . Acute rhinosinusitis 07/03/2018  . Allergic rhinitis   . Anxiety   . Asthma   . Back pain   . Bipolar disorder ([place])   . Borderline personality disorder ([place])   . Bupropion overdose   . Chronic pain   . Chronic pain syndrome   . Depression   . Hallucinations   . Migraines   . Nasal polyps   . Nausea   . Neck pain   . OCD (obsessive compulsive disorder)   . OCD (obsessive compulsive disorder)   . Ovarian cyst    left    Past Surgical History:  Procedure Laterality Date  . LAPAROSCOPIC TUBAL LIGATION Bilateral 06/13/2019   Procedure: LAPAROSCOPIC TUBAL LIGATION;  Surgeon: Chancy Milroy, MD;  Location: [place];  Service: Gynecology;  Laterality: Bilateral;  FILSHIE CLIPS  . NASAL SINUS SURGERY    . WISDOM TOOTH EXTRACTION      Family History  Problem Relation Age of Onset  . Healthy Father   . Healthy Mother   . Arthritis Maternal Grandmother   . Arthritis Maternal Grandfather   . Depression Paternal Grandmother     Social History:  reports that she has been smoking cigarettes. She has been smoking about 0.50 packs per day. She has never used smokeless tobacco. She reports that she does not drink alcohol and does not use drugs.  Allergies:  Allergies  Allergen Reactions  . Keflex [Cephalexin] Anaphylaxis  . Flagyl [Metronidazole] Diarrhea    No medications prior to admission.    Review of Systems  Constitutional: Negative.   Respiratory: Negative.   Cardiovascular: Negative.   Gastrointestinal: Negative.   Genitourinary: Positive for vaginal bleeding.    There were no vitals taken for this visit. Physical  Exam Constitutional:      Appearance: Normal appearance.  Cardiovascular:     Rate and Rhythm: Normal rate and regular rhythm.  Pulmonary:     Effort: Pulmonary effort is normal.     Breath sounds: Normal breath sounds.  Abdominal:     General: Bowel sounds are normal.     Palpations: Abdomen is soft.  Genitourinary:    Comments: Nl EGBUS, cervix no lesions, uterus small < 8 weeks, mobile, non tender, no masses Neurological:     Mental Status: She is alert.     No results found for this or any previous visit (from the past 24 hour(s)).  No results found.  Assessment/Plan: DUB refactory to medication  TVH with possible BS reviewed with pt. R/B/Post op care reviewed with pt. Pt desires to proceed.  Chancy Milroy 01/01/2020, 11:29 AM

## 2020-01-03 ENCOUNTER — Other Ambulatory Visit (HOSPITAL_COMMUNITY): Payer: Self-pay | Admitting: Psychiatry

## 2020-01-03 DIAGNOSIS — F4010 Social phobia, unspecified: Secondary | ICD-10-CM

## 2020-01-09 ENCOUNTER — Ambulatory Visit (HOSPITAL_COMMUNITY): Payer: Medicaid Other | Admitting: Licensed Clinical Social Worker

## 2020-01-11 ENCOUNTER — Encounter (HOSPITAL_COMMUNITY)
Admission: RE | Admit: 2020-01-11 | Discharge: 2020-01-11 | Disposition: A | Discharge status: Home / Self Care | Payer: Medicaid Other | Source: Ambulatory Visit | Attending: Obstetrics and Gynecology | Admitting: Obstetrics and Gynecology

## 2020-01-11 ENCOUNTER — Other Ambulatory Visit (HOSPITAL_COMMUNITY)
Admission: RE | Admit: 2020-01-11 | Discharge: 2020-01-11 | Disposition: A | Discharge status: Home / Self Care | Payer: Medicaid Other | Source: Ambulatory Visit | Attending: Obstetrics and Gynecology | Admitting: Obstetrics and Gynecology

## 2020-01-11 ENCOUNTER — Other Ambulatory Visit: Payer: Self-pay

## 2020-01-11 ENCOUNTER — Encounter (HOSPITAL_COMMUNITY): Payer: Self-pay

## 2020-01-11 DIAGNOSIS — Z01812 Encounter for preprocedural laboratory examination: Secondary | ICD-10-CM | POA: Insufficient documentation

## 2020-01-11 DIAGNOSIS — R42 Dizziness and giddiness: Secondary | ICD-10-CM | POA: Diagnosis not present

## 2020-01-11 DIAGNOSIS — Z20822 Contact with and (suspected) exposure to covid-19: Secondary | ICD-10-CM | POA: Insufficient documentation

## 2020-01-11 DIAGNOSIS — Z01818 Encounter for other preprocedural examination: Secondary | ICD-10-CM | POA: Insufficient documentation

## 2020-01-11 HISTORY — DX: Schizophrenia, unspecified: F20.9

## 2020-01-11 HISTORY — DX: Other specified postprocedural states: Z98.890

## 2020-01-11 HISTORY — DX: Other specified postprocedural states: R11.2

## 2020-01-11 HISTORY — DX: Pneumonia, unspecified organism: J18.9

## 2020-01-11 HISTORY — DX: Fibromyalgia: M79.7

## 2020-01-11 LAB — CBC
HCT: 46.9 % — ABNORMAL HIGH (ref 36.0–46.0)
Hemoglobin: 15.3 g/dL — ABNORMAL HIGH (ref 12.0–15.0)
MCH: 29.1 pg (ref 26.0–34.0)
MCHC: 32.6 g/dL (ref 30.0–36.0)
MCV: 89.3 fL (ref 80.0–100.0)
Platelets: 305 10*3/uL (ref 150–400)
RBC: 5.25 MIL/uL — ABNORMAL HIGH (ref 3.87–5.11)
RDW: 11.9 % (ref 11.5–15.5)
WBC: 5.1 10*3/uL (ref 4.0–10.5)
nRBC: 0 % (ref 0.0–0.2)

## 2020-01-11 LAB — BASIC METABOLIC PANEL
Anion gap: 9 (ref 5–15)
BUN: 9 mg/dL (ref 6–20)
CO2: 22 mmol/L (ref 22–32)
Calcium: 9.6 mg/dL (ref 8.9–10.3)
Chloride: 106 mmol/L (ref 98–111)
Creatinine, Ser: 0.99 mg/dL (ref 0.44–1.00)
GFR, Estimated: >60 mL/min (ref >60)
Glucose, Bld: 101 mg/dL — ABNORMAL HIGH (ref 70–99)
Potassium: 4 mmol/L (ref 3.5–5.1)
Sodium: 137 mmol/L (ref 135–145)

## 2020-01-11 LAB — POCT I-STAT, CHEM 8
BUN: 9 mg/dL (ref 6–20)
Calcium, Ion: 1.26 mmol/L (ref 1.15–1.40)
Chloride: 106 mmol/L (ref 98–111)
Creatinine, Ser: 0.9 mg/dL (ref 0.44–1.00)
Glucose, Bld: 100 mg/dL — ABNORMAL HIGH (ref 70–99)
HCT: 46 % (ref 36.0–46.0)
Hemoglobin: 15.6 g/dL — ABNORMAL HIGH (ref 12.0–15.0)
Potassium: 4.1 mmol/L (ref 3.5–5.1)
Sodium: 142 mmol/L (ref 135–145)
TCO2: 22 mmol/L (ref 22–32)

## 2020-01-11 LAB — SARS CORONAVIRUS 2 (TAT 6-24 HRS): SARS Coronavirus 2: NEGATIVE

## 2020-01-11 LAB — TYPE AND SCREEN
ABO/RH(D): B NEG
Antibody Screen: NEGATIVE

## 2020-01-11 NOTE — Progress Notes (Addendum)
PCP - [name] Cardiologist - denies  PPM/ICD - denies   Chest x-ray - n/a EKG - 01/11/2020 Stress Test - denies ECHO - 04/01/2019 Cardiac Cath - denies  Sleep Study - denies    Patient instructed to hold all Aspirin, NSAID's, herbal medications, fish oil and vitamins 7 days prior to surgery. Also instructed to remove jewelry and abstain from tobacco and vaping for 24 hours prior to surgery.    ERAS Protcol -yes PRE-SURGERY Ensure or G2- no  COVID TEST- 01/11/2020 1225  During appointment, pt was dizzy and felt like she was going to pass out. Snacks and water given to patient and Toni Arthurs was notified. Istat obtained before patient left the appointment.    Anesthesia review: yes, EKG, dizziness during PAT appointment, VSS. Records requested from [facility] with Dr. [name].  Hospital admission in February 2021 in which she was intubated.   Patient denies shortness of breath, fever, cough and chest pain at PAT appointment   All instructions explained to the patient, with a verbal understanding of the material. Patient agrees to go over the instructions while at home for a better understanding. Patient also instructed to self quarantine after being tested for COVID-19. The opportunity to ask questions was provided.

## 2020-01-11 NOTE — Anesthesia Preprocedure Evaluation (Addendum)
Anesthesia Evaluation  Patient identified by MRN, date of birth, ID band Patient awake    Reviewed: Allergy & Precautions, NPO status , Patient's Chart, lab work & pertinent test results  History of Anesthesia Complications (+) PONV  Airway Mallampati: II  TM Distance: >3 FB Neck ROM: Full    Dental no notable dental hx. (+) Teeth Intact, Dental Advisory Given   Pulmonary asthma , Current Smoker,    Pulmonary exam normal breath sounds clear to auscultation       Cardiovascular negative cardio ROS Normal cardiovascular exam Rhythm:Regular Rate:Normal  TTE 2021 1. Left ventricular ejection fraction, by visual estimation, is 55 to 60%. The left ventricle has normal function. There is no left ventricular hypertrophy. Normal diastolic function  2. Global right ventricle has normal systolic function.The right ventricular size is normal.  3. Left atrial size was normal.  4. Right atrial size was normal.  5. The mitral valve is normal in structure. No evidence of mitral valve regurgitation.  6. The tricuspid valve is normal in structure. Tricuspid valve  regurgitation is trivial.  7. The aortic valve is tricuspid. Aortic valve regurgitation is not visualized. No evidence of aortic valve sclerosis or stenosis.  8. The pulmonic valve was not well visualized. Pulmonic valve regurgitation is not visualized.  9. Normal pulmonary artery systolic pressure.   Neuro/Psych  Headaches, Seizures - (in setting of wellbutrin OD), Well Controlled,  PSYCHIATRIC DISORDERS Anxiety Depression Bipolar Disorder Schizophrenia    GI/Hepatic negative GI ROS, Neg liver ROS,   Endo/Other  negative endocrine ROS  Renal/GU negative Renal ROS  negative genitourinary   Musculoskeletal  (+) Fibromyalgia -  Abdominal   Peds  Hematology negative hematology ROS (+)   Anesthesia Other Findings   Reproductive/Obstetrics                            Anesthesia Physical Anesthesia Plan  ASA: III  Anesthesia Plan: General   Post-op Pain Management:    Induction: Intravenous  PONV Risk Score and Plan: Midazolam, Dexamethasone, Ondansetron and Scopolamine patch - Pre-op  Airway Management Planned: Oral ETT  Additional Equipment:   Intra-op Plan:   Post-operative Plan: Extubation in OR  Informed Consent: I have reviewed the patients History and Physical, chart, labs and discussed the procedure including the risks, benefits and alternatives for the proposed anesthesia with the patient or authorized representative who has indicated his/her understanding and acceptance.     Dental advisory given  Plan Discussed with: CRNA  Anesthesia Plan Comments: (PAT note written 01/11/2020 by Myra Gianotti, PA-C. )       Anesthesia Quick Evaluation

## 2020-01-11 NOTE — Progress Notes (Addendum)
Anesthesia Chart Review:  Case: [id number] Date/Time: 01/15/20 0715   Procedure: HYSTERECTOMY VAGINAL WITH POSSIBLE SALPINGECTOMY (Bilateral )   Anesthesia type: Choice   Pre-op diagnosis: DUB   Location: [facility] OR ROOM 07 / [facility]   Surgeons: Chancy Milroy, MD      DISCUSSION: Carmen Daniels is a 34 year old female scheduled for the above procedure.  History includes smoking, postoperative N/V, chronic pain syndrome, OCD, anxiety, hallucinations, schizoaffective, bipolar disorder, borderline personality disorder, fibromyalgia, intentional bupropion overdose (03/2019, critical prolonged QT, status epilepticus requiring intubation and Keppra taper), migraines (gets Botox injections), asthma, syncope (with prolonged QTc > 600 ms in the setting of polypharmacy, CBD, lithium). BMI is consistent with obesity.  - Hospitalization 03/28/19-04/10/19 for altered mental status in setting of intentional OD including lithium, Ambien, Flexeril, and Wellbutrin. 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 Suspected culprit was Wellbutrin. She was intubated 03/29/19 for airway protection and extubated 04/04/19. Brain MRI negative. Neurology recommended a tapered course of Keppra. Psychiatry consulted due to concerns of OD/suicide attempt. She was transitioned to [employer].  Hospital course was also complicated by aspiration pneumonia and possible right hand cellulitis, s/p Levaquin and Flagyl. - S/p laparoscopic bilateral tubal sterilization 06/13/19.  I evaluated Carmen Daniels during her 01/11/20 PAT visit because she reported feeling a lightheaded/dizzy on arrival to PAT. Some improvement with water and a snack. ISTAT8 done with labs to get a glucose and quicker HGB given history of UDB/menorrhagia. By ISTAT H/H 15.6/46.0 and glucose 100. EKG showed HR of 49 bpm, but upon recheck HR 60-70's. QT 432 ms, QTc 390 ms. O2 sat 100%. BP 102/67. Carmen Daniels without chest pain or SOB. Upon further questioning, she reported it is  her norm to feel a little lightheaded when she is anxious. She reported she took her usual meds, but denied any illicit drug use. By the time, I evaluated her she denied feeling like she might pass out. No nausea or diaphoresis. Her mother is with her and will be driving. She denied any known heart issues or recent syncope. No additional seizures since her OD 03/2019. She is still followed by neurology. She feels that from a mental health standpoint, she is doing well with her current regimen, denied SI. She reports her vaginal bleeding has improved on the increased Aygestin.   01/11/20 presurgical COVID-19 test is in process. Anesthesia team to evaluate on the day of surgery. She will need a urine pregnancy test.   VS: BP 102/67   Pulse 85   Temp 36.7 C   Resp 18   Ht 5\' 11"  (1.803 m)   Wt 105.8 kg   SpO2 100%   BMI 32.54 kg/m  Provider wore N95, universal face mask, face shield. Carmen Daniels wore face mask. Heart RRR, no murmur noted. Lungs clear. She did not appear in any acute distress during my evaluation. She was A&O x4 and able to walk unassisted.    PROVIDERS: [name], MD is PCP  Marcial Pacas, MD is neurologist   LABS: Labs reviewed: Acceptable for surgery. (all labs ordered are listed, but only abnormal results are displayed)  Labs Reviewed  BASIC METABOLIC PANEL - Abnormal; Notable for the following components:      Result Value   Glucose, Bld 101 (*)    All other components within normal limits  CBC - Abnormal; Notable for the following components:   RBC 5.25 (*)    Hemoglobin 15.3 (*)    HCT 46.9 (*)    All other  components within normal limits  POCT I-STAT, CHEM 8 - Abnormal; Notable for the following components:   Glucose, Bld 100 (*)    Hemoglobin 15.6 (*)    All other components within normal limits  TYPE AND SCREEN    OTHER: EEG 05/31/19: CONCLUSION: This is a  normal awake EEG.  There is no electrodiagnostic evidence of epileptiform discharge.   IMAGES: 1V  CXR 04/05/19 (in setting of OD hospitalization): IMPRESSION: - Diminished lung volumes with more streaky areas of opacity favoring subsegmental atelectasis. - Persistent patchy opacity in the right suprahilar lung and left base, worsened in the left lung base although this may be accentuated by volume loss.  MRI Brain 04/04/19: IMPRESSION: - No abnormality of the brain parenchyma identified to explain the clinical presentation. 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 I do not see evidence an encephalocele.   EKG: 01/11/20 11:05:33: Sinus bradycardia at 49 bpm Otherwise normal ECG Rate slower since 01 Apr 2019 tracing Confirmed by Dixie Dials ([id number]) on 01/11/2020 3:35:30 PM - HR rechecked after EKG and 60-70's bpm   CV: Echo 03/30/19: IMPRESSIONS  1. Left ventricular ejection fraction, by visual estimation, is 55 to  60%. The left ventricle has normal function. There is no left ventricular  hypertrophy. Normal diastolic function  2. Global right ventricle has normal systolic function.The right  ventricular size is normal.  3. Left atrial size was normal.  4. Right atrial size was normal.  5. The mitral valve is normal in structure. No evidence of mitral valve  regurgitation.  6. The tricuspid valve is normal in structure. Tricuspid valve  regurgitation is trivial.  7. The aortic valve is tricuspid. Aortic valve regurgitation is not  visualized. No evidence of aortic valve sclerosis or stenosis.  8. The pulmonic valve was not well visualized. Pulmonic valve  regurgitation is not visualized.  9. Normal pulmonary artery systolic pressure.    Past Medical History:  Diagnosis Date  . Acute respiratory failure with hypoxia ([place])   . Acute rhinosinusitis 07/03/2018  . Allergic rhinitis   . Anxiety   . Asthma   . Back pain   . Bipolar disorder ([place])   . Borderline personality disorder  ([place])   . Bupropion overdose   . Chronic pain   . Chronic pain syndrome   . Depression   . Fibromyalgia    generalized  . Hallucinations   . Migraines   . Nasal polyps   . Nausea   . Neck pain   . OCD (obsessive compulsive disorder)   . OCD (obsessive compulsive disorder)   . Ovarian cyst    left  . Pneumonia    2021  . PONV (postoperative nausea and vomiting)   . Schizophrenia ([place])    schizoaffective    Past Surgical History:  Procedure Laterality Date  . LAPAROSCOPIC TUBAL LIGATION Bilateral 06/13/2019   Procedure: LAPAROSCOPIC TUBAL LIGATION;  Surgeon: Chancy Milroy, MD;  Location: [facility];  Service: Gynecology;  Laterality: Bilateral;  FILSHIE CLIPS  . NASAL SINUS SURGERY    . TUBAL LIGATION    . tubes in ears    . WISDOM TOOTH EXTRACTION      MEDICATIONS: . ARIPiprazole (ABILIFY) 5 MG tablet  . beclomethasone (QVAR) 40 MCG/ACT inhaler  . botulinum toxin Type A (BOTOX) 100 units SOLR injection  . clonazePAM (KLONOPIN) 1 MG tablet  . Deutetrabenazine (AUSTEDO) 9 MG  TABS  . Fremanezumab-vfrm (AJOVY) 225 MG/1.5ML SOAJ  . gabapentin (NEURONTIN) 300 MG capsule  . hydrOXYzine (ATARAX/VISTARIL) 25 MG tablet  . metroNIDAZOLE (FLAGYL) 500 MG tablet  . norethindrone (AYGESTIN) 5 MG tablet  . ondansetron (ZOFRAN) 4 MG tablet  . SUMAtriptan (IMITREX) 100 MG tablet  . traMADol (ULTRAM) 50 MG tablet  . traZODone (DESYREL) 50 MG tablet  . Vilazodone HCl (VIIBRYD) 20 MG TABS  . zolpidem (AMBIEN) 10 MG tablet  . ibuprofen (ADVIL) 800 MG tablet  . norethindrone (AYGESTIN) 5 MG tablet   No current facility-administered medications for this encounter.    Myra Gianotti, PA-C Surgical Short Stay/Anesthesiology [facility] Phone [phone] [facility] Phone [phone] 01/11/2020 5:05 PM

## 2020-01-11 NOTE — Pre-Procedure Instructions (Signed)
[place], [place] [address] Phone: [phone] Fax: [phone]     Your procedure is scheduled on Tuesday November 23 at 07:30 AM.  Report to [facility] Main Entrance "A" at 05:30 A.M., and check in at the Admitting office.  Call this number if you have problems the morning of surgery:  [phone]  Call [phone] if you have any questions prior to your surgery date Monday-Friday 8am-4pm.    Remember:  Do not eat after midnight the night before your surgery.  You may drink clear liquids until 04:30 AM the morning of your surgery.   Clear liquids allowed are: Water, Non-Citrus Juices (without pulp), Carbonated Beverages, Clear Tea, Black Coffee Only, and Gatorade.    Take these medicines the morning of surgery with A SIP OF WATER:  ARIPiprazole (ABILIFY) clonazePAM (KLONOPIN) Deutetrabenazine (AUSTEDO) norethindrone (AYGESTIN) Vilazodone HCl (VIIBRYD)   IF NEEDED: beclomethasone (QVAR) inhaler gabapentin (NEURONTIN) hydrOXYzine (ATARAX/VISTARIL) SUMAtriptan (IMITREX)   As of today, STOP taking any Aspirin (unless otherwise instructed by your surgeon) Aleve, Naproxen, Ibuprofen, Motrin, Advil, Goody's, BC's, all herbal medications, fish oil, and all vitamins.        The Morning of Surgery:               Do not wear jewelry, make up, or nail polish.            Do not wear lotions, powders, perfumes, or deodorant.            Do not shave 48 hours prior to surgery.              Do not bring valuables to the hospital.            [facility] is not responsible for any belongings or valuables.  Do NOT Smoke (Tobacco/Vaping) or drink Alcohol 24 hours prior to your procedure.  If you use a CPAP at night, you may bring all equipment for your overnight stay.   Contacts, glasses, dentures or bridgework may not be worn into surgery.      For patients admitted to the hospital, discharge time will  be determined by your treatment team.   Patients discharged the day of surgery will not be allowed to drive home, and someone needs to stay with them for 24 hours.    Special instructions:   [place]- Preparing For Surgery  Before surgery, you can play an important role. Because skin is not sterile, your skin needs to be as free of germs as possible. You can reduce the number of germs on your skin by washing with CHG (chlorahexidine gluconate) Soap before surgery.  CHG is an antiseptic cleaner which kills germs and bonds with the skin to continue killing germs even after washing.    Oral Hygiene is also important to reduce your risk of infection.  Remember - BRUSH YOUR TEETH THE MORNING OF SURGERY WITH YOUR REGULAR TOOTHPASTE  Please do not use if you have an allergy to CHG or antibacterial soaps. If your skin becomes reddened/irritated stop using the CHG.  Do not shave (including legs and underarms) for at least 48 hours prior to first CHG shower. It is OK to shave your face.  Please follow these instructions carefully.   1. Shower the NIGHT BEFORE SURGERY and the MORNING OF SURGERY with CHG Soap.   2. If you chose to wash your hair, wash your hair first as usual with  your normal shampoo.  3. After you shampoo, rinse your hair and body thoroughly to remove the shampoo.  4. Use CHG as you would any other liquid soap. You can apply CHG directly to the skin and wash gently with a scrungie or a clean washcloth.   5. Apply the CHG Soap to your body ONLY FROM THE NECK DOWN.  Do not use on open wounds or open sores. Avoid contact with your eyes, ears, mouth and genitals (private parts). Wash Face and genitals (private parts)  with your normal soap.   6. Wash thoroughly, paying special attention to the area where your surgery will be performed.  7. Thoroughly rinse your body with warm water from the neck down.  8. DO NOT shower/wash with your normal soap after using and rinsing off the  CHG Soap.  9. Pat yourself dry with a CLEAN TOWEL.  10. Wear CLEAN PAJAMAS to bed the night before surgery  11. Place CLEAN SHEETS on your bed the night of your first shower and DO NOT SLEEP WITH PETS.   Day of Surgery: SHOWER Wear Clean/Comfortable clothing the morning of surgery Do not apply any deodorants/lotions.   Remember to brush your teeth WITH YOUR REGULAR TOOTHPASTE.   Please read over the following fact sheets that you were given.

## 2020-01-15 ENCOUNTER — Observation Stay (HOSPITAL_COMMUNITY): Payer: Medicaid Other | Admitting: Vascular Surgery

## 2020-01-15 ENCOUNTER — Observation Stay (HOSPITAL_COMMUNITY)
Admission: RE | Admit: 2020-01-15 | Discharge: 2020-01-16 | Disposition: A | Discharge status: Home / Self Care | Payer: Medicaid Other | Source: Ambulatory Visit | Attending: Obstetrics and Gynecology | Admitting: Obstetrics and Gynecology

## 2020-01-15 ENCOUNTER — Other Ambulatory Visit: Payer: Self-pay

## 2020-01-15 ENCOUNTER — Encounter (HOSPITAL_COMMUNITY): Payer: Self-pay | Admitting: Obstetrics and Gynecology

## 2020-01-15 ENCOUNTER — Encounter (HOSPITAL_COMMUNITY)
Admission: RE | Disposition: A | Discharge status: Home / Self Care | Payer: Self-pay | Source: Ambulatory Visit | Attending: Obstetrics and Gynecology

## 2020-01-15 DIAGNOSIS — D252 Subserosal leiomyoma of uterus: Secondary | ICD-10-CM | POA: Diagnosis not present

## 2020-01-15 DIAGNOSIS — J45909 Unspecified asthma, uncomplicated: Secondary | ICD-10-CM | POA: Insufficient documentation

## 2020-01-15 DIAGNOSIS — Z9889 Other specified postprocedural states: Secondary | ICD-10-CM

## 2020-01-15 DIAGNOSIS — N938 Other specified abnormal uterine and vaginal bleeding: Principal | ICD-10-CM

## 2020-01-15 DIAGNOSIS — F17210 Nicotine dependence, cigarettes, uncomplicated: Secondary | ICD-10-CM | POA: Insufficient documentation

## 2020-01-15 DIAGNOSIS — N939 Abnormal uterine and vaginal bleeding, unspecified: Secondary | ICD-10-CM

## 2020-01-15 HISTORY — PX: VAGINAL HYSTERECTOMY: SHX2639

## 2020-01-15 LAB — POCT PREGNANCY, URINE: Preg Test, Ur: NEGATIVE

## 2020-01-15 SURGERY — HYSTERECTOMY, VAGINAL
Anesthesia: General | Site: Vagina | Laterality: Bilateral

## 2020-01-15 MED ORDER — OXYCODONE-ACETAMINOPHEN 5-325 MG PO TABS
ORAL_TABLET | ORAL | Status: AC
  Filled 2020-01-15: qty 2

## 2020-01-15 MED ORDER — HYDROXYZINE HCL 25 MG PO TABS: 25.0000 mg | ORAL_TABLET | Freq: Three times a day (TID) | ORAL | Status: DC | PRN

## 2020-01-15 MED ORDER — DEXAMETHASONE SODIUM PHOSPHATE 10 MG/ML IJ SOLN
INTRAMUSCULAR | Status: AC
  Filled 2020-01-15: qty 1

## 2020-01-15 MED ORDER — SIMETHICONE 80 MG PO CHEW: 80.0000 mg | CHEWABLE_TABLET | Freq: Four times a day (QID) | ORAL | Status: DC | PRN

## 2020-01-15 MED ORDER — HYDROMORPHONE HCL 1 MG/ML IJ SOLN
1.0000 mg | INTRAMUSCULAR | Status: DC | PRN
  Administered 2020-01-15 – 2020-01-16 (×8): 1 mg via INTRAVENOUS
  Filled 2020-01-15 (×8): qty 1

## 2020-01-15 MED ORDER — FENTANYL CITRATE (PF) 100 MCG/2ML IJ SOLN
INTRAMUSCULAR | Status: AC
  Filled 2020-01-15: qty 2

## 2020-01-15 MED ORDER — PROPOFOL 10 MG/ML IV BOLUS
INTRAVENOUS | Status: DC | PRN
  Administered 2020-01-15: 150 mg via INTRAVENOUS

## 2020-01-15 MED ORDER — AMISULPRIDE (ANTIEMETIC) 5 MG/2ML IV SOLN
INTRAVENOUS | Status: AC
  Filled 2020-01-15: qty 4

## 2020-01-15 MED ORDER — LIDOCAINE 2% (20 MG/ML) 5 ML SYRINGE
INTRAMUSCULAR | Status: DC | PRN
  Administered 2020-01-15: 60 mg via INTRAVENOUS

## 2020-01-15 MED ORDER — EPHEDRINE SULFATE 50 MG/ML IJ SOLN
INTRAMUSCULAR | Status: DC | PRN
  Administered 2020-01-15 (×2): 5 mg via INTRAVENOUS

## 2020-01-15 MED ORDER — ORAL CARE MOUTH RINSE: 15.0000 mL | Freq: Once | OROMUCOSAL | Status: AC

## 2020-01-15 MED ORDER — ARIPIPRAZOLE 10 MG PO TABS
5.0000 mg | ORAL_TABLET | Freq: Every day | ORAL | Status: DC
  Administered 2020-01-16: 5 mg via ORAL
  Filled 2020-01-15 (×2): qty 1

## 2020-01-15 MED ORDER — FENTANYL CITRATE (PF) 100 MCG/2ML IJ SOLN
25.0000 ug | INTRAMUSCULAR | Status: DC | PRN
  Administered 2020-01-15 (×3): 50 ug via INTRAVENOUS

## 2020-01-15 MED ORDER — POVIDONE-IODINE 10 % EX SWAB: 2.0000 "application " | Freq: Once | CUTANEOUS | Status: DC

## 2020-01-15 MED ORDER — SOD CITRATE-CITRIC ACID 500-334 MG/5ML PO SOLN
30.0000 mL | ORAL | Status: AC
  Administered 2020-01-15: 30 mL via ORAL
  Filled 2020-01-15: qty 15

## 2020-01-15 MED ORDER — SENNA 8.6 MG PO TABS
1.0000 | ORAL_TABLET | Freq: Two times a day (BID) | ORAL | Status: DC
  Filled 2020-01-15: qty 1

## 2020-01-15 MED ORDER — CLINDAMYCIN PHOSPHATE 900 MG/50ML IV SOLN
900.0000 mg | INTRAVENOUS | Status: AC
  Administered 2020-01-15: 900 mg via INTRAVENOUS
  Filled 2020-01-15: qty 50

## 2020-01-15 MED ORDER — FENTANYL CITRATE (PF) 250 MCG/5ML IJ SOLN
INTRAMUSCULAR | Status: DC | PRN
  Administered 2020-01-15: 100 ug via INTRAVENOUS

## 2020-01-15 MED ORDER — MIDAZOLAM HCL 2 MG/2ML IJ SOLN
INTRAMUSCULAR | Status: AC
  Filled 2020-01-15: qty 2

## 2020-01-15 MED ORDER — DEXAMETHASONE SODIUM PHOSPHATE 10 MG/ML IJ SOLN
INTRAMUSCULAR | Status: DC | PRN
  Administered 2020-01-15: 10 mg via INTRAVENOUS

## 2020-01-15 MED ORDER — ONDANSETRON HCL 4 MG/2ML IJ SOLN
INTRAMUSCULAR | Status: DC | PRN
  Administered 2020-01-15: 4 mg via INTRAVENOUS

## 2020-01-15 MED ORDER — CLONAZEPAM 1 MG PO TABS
1.0000 mg | ORAL_TABLET | Freq: Three times a day (TID) | ORAL | Status: DC | PRN
  Administered 2020-01-15 – 2020-01-16 (×2): 1 mg via ORAL
  Filled 2020-01-15 (×2): qty 1

## 2020-01-15 MED ORDER — LACTATED RINGERS IV SOLN: INTRAVENOUS | Status: DC

## 2020-01-15 MED ORDER — MIDAZOLAM HCL 2 MG/2ML IJ SOLN
INTRAMUSCULAR | Status: DC | PRN
  Administered 2020-01-15: 2 mg via INTRAVENOUS

## 2020-01-15 MED ORDER — GABAPENTIN 600 MG PO TABS
300.0000 mg | ORAL_TABLET | Freq: Three times a day (TID) | ORAL | Status: DC | PRN
  Filled 2020-01-15: qty 1

## 2020-01-15 MED ORDER — KETOROLAC TROMETHAMINE 30 MG/ML IJ SOLN
30.0000 mg | Freq: Four times a day (QID) | INTRAMUSCULAR | Status: AC
  Administered 2020-01-15 – 2020-01-16 (×4): 30 mg via INTRAVENOUS
  Filled 2020-01-15 (×4): qty 1

## 2020-01-15 MED ORDER — ACETAMINOPHEN 500 MG PO TABS
1000.0000 mg | ORAL_TABLET | Freq: Once | ORAL | Status: DC
  Filled 2020-01-15: qty 2

## 2020-01-15 MED ORDER — FENTANYL CITRATE (PF) 250 MCG/5ML IJ SOLN
INTRAMUSCULAR | Status: AC
  Filled 2020-01-15: qty 5

## 2020-01-15 MED ORDER — NICOTINE 14 MG/24HR TD PT24
14.0000 mg | MEDICATED_PATCH | Freq: Every day | TRANSDERMAL | Status: DC
  Administered 2020-01-15: 14 mg via TRANSDERMAL
  Filled 2020-01-15: qty 1

## 2020-01-15 MED ORDER — SUGAMMADEX SODIUM 200 MG/2ML IV SOLN
INTRAVENOUS | Status: DC | PRN
  Administered 2020-01-15: 420 mg via INTRAVENOUS

## 2020-01-15 MED ORDER — IBUPROFEN 100 MG PO CHEW
800.0000 mg | CHEWABLE_TABLET | Freq: Three times a day (TID) | ORAL | Status: DC
  Filled 2020-01-15: qty 8

## 2020-01-15 MED ORDER — OXYCODONE-ACETAMINOPHEN 5-325 MG PO TABS
2.0000 | ORAL_TABLET | ORAL | Status: DC | PRN
  Administered 2020-01-15 – 2020-01-16 (×4): 2 via ORAL
  Filled 2020-01-15 (×3): qty 2

## 2020-01-15 MED ORDER — ZOLPIDEM TARTRATE 5 MG PO TABS
5.0000 mg | ORAL_TABLET | Freq: Every evening | ORAL | Status: DC | PRN
  Administered 2020-01-15: 5 mg via ORAL
  Filled 2020-01-15: qty 1

## 2020-01-15 MED ORDER — ROCURONIUM BROMIDE 10 MG/ML (PF) SYRINGE
PREFILLED_SYRINGE | INTRAVENOUS | Status: DC | PRN
  Administered 2020-01-15: 100 mg via INTRAVENOUS

## 2020-01-15 MED ORDER — ACETAMINOPHEN 500 MG PO TABS
1000.0000 mg | ORAL_TABLET | ORAL | Status: AC
  Administered 2020-01-15: 1000 mg via ORAL

## 2020-01-15 MED ORDER — TRAZODONE HCL 50 MG PO TABS
50.0000 mg | ORAL_TABLET | Freq: Every evening | ORAL | Status: DC | PRN
  Administered 2020-01-15: 50 mg via ORAL
  Filled 2020-01-15: qty 1

## 2020-01-15 MED ORDER — ONDANSETRON HCL 4 MG/2ML IJ SOLN
INTRAMUSCULAR | Status: AC
  Filled 2020-01-15: qty 2

## 2020-01-15 MED ORDER — OXYCODONE-ACETAMINOPHEN 5-325 MG PO TABS: 1.0000 | ORAL_TABLET | ORAL | Status: DC | PRN

## 2020-01-15 MED ORDER — PROPOFOL 10 MG/ML IV BOLUS
INTRAVENOUS | Status: AC
  Filled 2020-01-15: qty 20

## 2020-01-15 MED ORDER — CHLORHEXIDINE GLUCONATE 0.12 % MT SOLN
15.0000 mL | Freq: Once | OROMUCOSAL | Status: AC
  Administered 2020-01-15: 15 mL via OROMUCOSAL
  Filled 2020-01-15: qty 15

## 2020-01-15 MED ORDER — KETOROLAC TROMETHAMINE 15 MG/ML IJ SOLN
15.0000 mg | INTRAMUSCULAR | Status: AC
  Administered 2020-01-15 (×2): 15 mg via INTRAVENOUS
  Filled 2020-01-15: qty 1

## 2020-01-15 MED ORDER — GENTAMICIN SULFATE 40 MG/ML IJ SOLN
480.0000 mg | INTRAVENOUS | Status: AC
  Administered 2020-01-15: 480 mg via INTRAVENOUS
  Filled 2020-01-15: qty 12

## 2020-01-15 MED ORDER — AMISULPRIDE (ANTIEMETIC) 5 MG/2ML IV SOLN
10.0000 mg | Freq: Once | INTRAVENOUS | Status: AC
  Administered 2020-01-15: 10 mg via INTRAVENOUS

## 2020-01-15 SURGICAL SUPPLY — 27 items
BLADE SURG 10 STRL SS (BLADE) ×3 IMPLANT
CANISTER SUCT 3000ML PPV (MISCELLANEOUS) ×3 IMPLANT
COVER WAND RF STERILE (DRAPES) ×3 IMPLANT
GAUZE 4X4 16PLY RFD (DISPOSABLE) ×3 IMPLANT
GLOVE BIO SURGEON STRL SZ7.5 (GLOVE) ×3 IMPLANT
GLOVE BIOGEL PI IND STRL 6.5 (GLOVE) ×2 IMPLANT
GLOVE BIOGEL PI IND STRL 7.0 (GLOVE) ×4 IMPLANT
GLOVE BIOGEL PI IND STRL 8 (GLOVE) ×2 IMPLANT
GLOVE BIOGEL PI INDICATOR 6.5 (GLOVE) ×1
GLOVE BIOGEL PI INDICATOR 7.0 (GLOVE) ×2
GLOVE BIOGEL PI INDICATOR 8 (GLOVE) ×1
GOWN STRL REUS W/ TWL LRG LVL3 (GOWN DISPOSABLE) ×6 IMPLANT
GOWN STRL REUS W/ TWL XL LVL3 (GOWN DISPOSABLE) ×2 IMPLANT
GOWN STRL REUS W/TWL LRG LVL3 (GOWN DISPOSABLE) ×6
GOWN STRL REUS W/TWL XL LVL3 (GOWN DISPOSABLE) ×2
KIT TURNOVER KIT B (KITS) ×3 IMPLANT
NS IRRIG 1000ML POUR BTL (IV SOLUTION) ×3 IMPLANT
PACK VAGINAL WOMENS (CUSTOM PROCEDURE TRAY) ×3 IMPLANT
PAD OB MATERNITY 4.3X12.25 (PERSONAL CARE ITEMS) ×3 IMPLANT
SUT VIC AB 2-0 CT1 18 (SUTURE) ×3 IMPLANT
SUT VIC AB 2-0 CT1 27 (SUTURE) ×2
SUT VIC AB 2-0 CT1 TAPERPNT 27 (SUTURE) ×2 IMPLANT
SUT VIC AB PLUS 45CM 1-MO-4 (SUTURE) ×6 IMPLANT
SUT VICRYL 1 TIES 12X18 (SUTURE) ×3 IMPLANT
TOWEL GREEN STERILE FF (TOWEL DISPOSABLE) ×6 IMPLANT
TRAY FOLEY W/BAG SLVR 14FR (SET/KITS/TRAYS/PACK) ×3 IMPLANT
UNDERPAD 30X36 HEAVY ABSORB (UNDERPADS AND DIAPERS) ×3 IMPLANT

## 2020-01-15 NOTE — Progress Notes (Signed)
Pt admitted to 6N32 from PACU in bed.  Warm blankets to abd for comfort. Pt is alert, oriented. Foley catheter in place to be DC'd at 1500.

## 2020-01-15 NOTE — Anesthesia Procedure Notes (Signed)
Procedure Name: Intubation Date/Time: 01/15/2020 7:56 AM Performed by: Bryson Corona, CRNA Pre-anesthesia Checklist: Patient identified, Emergency Drugs available, Suction available and Patient being monitored Patient Re-evaluated:Patient Re-evaluated prior to induction Oxygen Delivery Method: Circle System Utilized Preoxygenation: Pre-oxygenation with 100% oxygen Induction Type: IV induction Ventilation: Mask ventilation without difficulty Laryngoscope Size: Mac and 3 Grade View: Grade I Tube type: Oral Tube size: 7.0 mm Number of attempts: 1 Airway Equipment and Method: Stylet and Oral airway Placement Confirmation: ETT inserted through vocal cords under direct vision,  positive ETCO2 and breath sounds checked- equal and bilateral Secured at: 22 cm Tube secured with: Tape Dental Injury: Teeth and Oropharynx as per pre-operative assessment

## 2020-01-15 NOTE — Interval H&P Note (Signed)
History and Physical Interval Note:  01/15/2020 7:14 AM  Carmen Daniels  has presented today for surgery, with the diagnosis of DUB.  The various methods of treatment have been discussed with the patient and family. After consideration of risks, benefits and other options for treatment, the patient has consented to  Procedure(s): HYSTERECTOMY VAGINAL WITH POSSIBLE SALPINGECTOMY (Bilateral) as a surgical intervention.  The patient's history has been reviewed, patient examined, no change in status, stable for surgery.  I have reviewed the patient's chart and labs.  Questions were answered to the patient's satisfaction.     Chancy Milroy

## 2020-01-15 NOTE — Op Note (Signed)
Carmen Daniels PROCEDURE DATE: 01/15/2020  PREOPERATIVE DIAGNOSIS: DUB POSTOPERATIVE DIAGNOSIS:  DUB SURGEON:   Arlina Robes, M.D. ASSISTANT: Veneda Melter, M.D.  An experienced assistant was required given the standard of surgical care given the complexity of the case.  This assistant was needed for exposure, dissection, suctioning, retraction, and for overall help during the procedure.   OPERATION:  Total Vaginal hysterectomy ANESTHESIA:  General endotracheal.  INDICATIONS: The patient is a 34 y.o. No obstetric history on file. with history of symptomatic uterine fibroids/menorrhagia. The patient made a decision to undergo definite surgical treatment. On the preoperative visit, the risks, benefits, indications, and alternatives of the procedure were reviewed with the patient.  On the day of surgery, the risks of surgery were again discussed with the patient including but not limited to: bleeding which may require transfusion or reoperation; infection which may require antibiotics; injury to bowel, bladder, ureters or other surrounding organs; need for additional procedures; thromboembolic phenomenon, incisional problems and other postoperative/anesthesia complications. Written informed consent was obtained.    OPERATIVE FINDINGS: A 6 week size uterus with normal tubes and ovaries bilaterally.  ESTIMATED BLOOD LOSS: 75 ml FLUIDS:  As recorded URINE OUTPUT: As recorded SPECIMENS:  Uterus and cervix sent to pathology COMPLICATIONS:  None immediate.  DESCRIPTION OF PROCEDURE:  The patient received intravenous antibiotics and had sequential compression devices applied to her lower extremities while in the preoperative area.  She was then taken to the operating room where general anesthesia was administered and was found to be adequate.  She was placed in the dorsal lithotomy position, and was prepped and draped in a sterile manner.  A Foley catheter was inserted into her bladder and attached to  constant drainage. After an adequate timeout was performed, attention was turned to her pelvis.  A weighted speculum was then placed in the vagina. The posterior lip of the cervix was grasped and the posterior cul-de sac was entered sharply. Long bill speculum was placed. The anterior lip of the cervix was grasped.   The anterior cul-de-sac was then entered sharply without difficulty and a retractor was placed.  The Zeplin clamp was then used to clamp the uterosacral ligaments on either side.  They were then cut and sutured ligated with 0 Vicryl.  Of note, all sutures used in this case were 0 Vicryl unless otherwise noted.   The cardinal ligaments were then clamped, cut and ligated. The uterine vessels and broad ligaments were then serially clamped with the Zeplin clamps, cut, and suture ligated on both sides.  Excellent hemostasis was noted at this point. The final pedical involving the IP was clamped, tie in a free tie fashion and then in a Blairs fashion. The specimen was then passed off for pathology. I was unable to visualized the tubes bilateral, thus they were not removed.   After completion of the hysterectomy, all pedicles from the uterosacral ligament to the cornua were examined hemostasis was confirmed. The peritoneum was then closed with 2/0 Vicryl in a purse string fashion.  The vaginal cuff was then closed with a in a running locked fashion with care given to incorporate the uterosacral pedicles bilaterally.  All instruments were then removed from the pelvis and a vaginal packing saturated with estrogen cream was placed.  The patient tolerated the procedure well.  All instruments, needles, and sponge counts were correct x 2. The patient was taken to the recovery room in stable condition.    Arlina Robes, MD, [facility] Attending Obstetrician &  Scientist, product/process development, [place]

## 2020-01-15 NOTE — Discharge Instructions (Signed)
Vaginal Hysterectomy, Care After Refer to this sheet in the next few weeks. These instructions provide you with information about caring for yourself after your procedure. Your health care provider may also give you more specific instructions. Your treatment has been planned according to current medical practices, but problems sometimes occur. Call your health care provider if you have any problems or questions after your procedure. What can I expect after the procedure? After the procedure, it is common to have:  Pain.  Soreness and numbness in your incision areas.  Vaginal bleeding and discharge.  Constipation.  Temporary problems emptying the bladder.  Feelings of sadness or other emotions. Follow these instructions at home: Medicines  Take over-the-counter and prescription medicines only as told by your health care provider.  If you were prescribed an antibiotic medicine, take it as told by your health care provider. Do not stop taking the antibiotic even if you start to feel better.  Do not drive or operate heavy machinery while taking prescription pain medicine. Activity  Return to your normal activities as told by your health care provider. Ask your health care provider what activities are safe for you.  Get regular exercise as told by your health care provider. You may be told to take short walks every day and go farther each time.  Do not lift anything that is heavier than 10 lb (4.5 kg). General instructions   Do not put anything in your vagina for 6 weeks after your surgery or as told by your health care provider. This includes tampons and douches.  Do not have sex until your health care provider says you can.  Do not take baths, swim, or use a hot tub until your health care provider approves.  Drink enough fluid to keep your urine clear or pale yellow.  Do not drive for 24 hours if you were given a sedative.  Keep all follow-up visits as told by your health  care provider. This is important. Contact a health care provider if:  Your pain medicine is not helping.  You have a fever.  You have redness, swelling, or pain at your incision site.  You have blood, pus, or a bad-smelling discharge from your vagina.  You continue to have difficulty urinating. Get help right away if:  You have severe abdominal or back pain.  You have heavy bleeding from your vagina.  You have chest pain or shortness of breath. This information is not intended to replace advice given to you by your health care provider. Make sure you discuss any questions you have with your health care provider. Document Revised: 10/02/2015 Document Reviewed: 02/23/2015 Elsevier Patient Education  2020 [employer]  

## 2020-01-15 NOTE — Plan of Care (Signed)
°  Problem: Education: Goal: Knowledge of the prescribed therapeutic regimen will improve Outcome: Progressing Goal: Understanding of sexual limitations or changes related to disease process or condition will improve Outcome: Progressing

## 2020-01-15 NOTE — Transfer of Care (Signed)
Immediate Anesthesia Transfer of Care Note  Patient: Carmen Daniels  Procedure(s) Performed: HYSTERECTOMY VAGINAL WITH POSSIBLE SALPINGECTOMY (Bilateral Vagina )  Patient Location: PACU  Anesthesia Type:General  Level of Consciousness: drowsy and patient cooperative  Airway & Oxygen Therapy: Patient Spontanous Breathing and Patient connected to face mask oxygen  Post-op Assessment: Report given to RN and Post -op Vital signs reviewed and stable  Post vital signs: Reviewed and stable  Last Vitals:  Vitals Value Taken Time  BP 125/51 01/15/20 0907  Temp 36.7 C 01/15/20 0907  Pulse 61 01/15/20 0917  Resp 16 01/15/20 0917  SpO2 100 % 01/15/20 0917  Vitals shown include unvalidated device data.  Last Pain:  Vitals:   01/15/20 0907  PainSc: 9       Patients Stated Pain Goal: 4 (59/29/24 4628)  Complications: No complications documented.

## 2020-01-16 ENCOUNTER — Encounter (HOSPITAL_COMMUNITY): Payer: Self-pay | Admitting: Obstetrics and Gynecology

## 2020-01-16 DIAGNOSIS — N938 Other specified abnormal uterine and vaginal bleeding: Secondary | ICD-10-CM | POA: Diagnosis not present

## 2020-01-16 LAB — CBC
HCT: 38.2 % (ref 36.0–46.0)
Hemoglobin: 12.9 g/dL (ref 12.0–15.0)
MCH: 29.6 pg (ref 26.0–34.0)
MCHC: 33.8 g/dL (ref 30.0–36.0)
MCV: 87.6 fL (ref 80.0–100.0)
Platelets: 274 10*3/uL (ref 150–400)
RBC: 4.36 MIL/uL (ref 3.87–5.11)
RDW: 11.9 % (ref 11.5–15.5)
WBC: 10.4 10*3/uL (ref 4.0–10.5)
nRBC: 0 % (ref 0.0–0.2)

## 2020-01-16 LAB — BASIC METABOLIC PANEL
Anion gap: 8 (ref 5–15)
BUN: 9 mg/dL (ref 6–20)
CO2: 23 mmol/L (ref 22–32)
Calcium: 8.9 mg/dL (ref 8.9–10.3)
Chloride: 105 mmol/L (ref 98–111)
Creatinine, Ser: 0.78 mg/dL (ref 0.44–1.00)
GFR, Estimated: >60 mL/min (ref >60)
Glucose, Bld: 155 mg/dL — ABNORMAL HIGH (ref 70–99)
Potassium: 4.4 mmol/L (ref 3.5–5.1)
Sodium: 136 mmol/L (ref 135–145)

## 2020-01-16 LAB — SURGICAL PATHOLOGY

## 2020-01-16 MED ORDER — IBUPROFEN 100 MG PO CHEW
800.0000 mg | CHEWABLE_TABLET | Freq: Three times a day (TID) | ORAL | 1 refills | Status: DC
Start: 2020-01-16 — End: 2020-01-26

## 2020-01-16 MED ORDER — OXYCODONE-ACETAMINOPHEN 5-325 MG PO TABS: 1.0000 | ORAL_TABLET | ORAL | 0 refills | Status: DC | PRN

## 2020-01-16 NOTE — Anesthesia Postprocedure Evaluation (Signed)
Anesthesia Post Note  Patient: Carmen Daniels  Procedure(s) Performed: HYSTERECTOMY VAGINAL (Bilateral Vagina )     Patient location during evaluation: PACU Anesthesia Type: General Level of consciousness: awake and alert Pain management: pain level controlled Vital Signs Assessment: post-procedure vital signs reviewed and stable Respiratory status: spontaneous breathing, nonlabored ventilation, respiratory function stable and patient connected to nasal cannula oxygen Cardiovascular status: blood pressure returned to baseline and stable Postop Assessment: no apparent nausea or vomiting Anesthetic complications: no   No complications documented.  Last Vitals:  Vitals:   01/15/20 1955 01/16/20 0432  BP: 127/62 (!) 115/57  Pulse: 63 72  Resp: 16 18  Temp: 36.7 C 36.6 C  SpO2: 100% 100%    Last Pain:  Vitals:   01/16/20 0855  TempSrc:   PainSc: 6                  Kelsei Defino L Tajuan Dufault

## 2020-01-16 NOTE — Progress Notes (Signed)
Education and AVS papers given to the patient. All questions are answered. IV removed and intact. VS stable.  Pt transferred off the unit

## 2020-01-16 NOTE — Discharge Summary (Signed)
Physician Discharge Summary  Patient ID: Carmen Daniels MRN: [id number] DOB/AGE: [birth year] 35 y.o.  Admit date: 01/15/2020 Discharge date: 01/16/2020  Admission Diagnoses:DUB  Discharge Diagnoses:  Active Problems:   Post-operative state   DUB (dysfunctional uterine bleeding)   Discharged Condition: good  Hospital Course: Carmen Daniels was admitted with above Dx. She underwent TVH. See OP note for additional information. Her post op course was unremarkable. She progressed to ambulating, voiding, tolerating diet and good oral pain control. Felt amendable for discharge home. Discharge instructions, medications and follow up were reviewed with pt. She verbalized understanding.   Consults: NA  Significant Diagnostic Studies: labs  Treatments: surgery: TVH  Discharge Exam: Blood pressure (!) 115/57, pulse 72, temperature 97.9 F (36.6 C), temperature source Oral, resp. rate 18, height 5\' 11"  (1.803 m), weight 105.8 kg, SpO2 100 %. Lungs clear Heart RRR Abd soft + BS GU scant bleeding Ext non tender  Disposition: Discharge disposition: 01-Home or Self Care       Discharge Instructions    Call MD for:  difficulty breathing, headache or visual disturbances   Complete by: As directed    Call MD for:  extreme fatigue   Complete by: As directed    Call MD for:  hives   Complete by: As directed    Call MD for:  persistant dizziness or light-headedness   Complete by: As directed    Call MD for:  persistant nausea and vomiting   Complete by: As directed    Call MD for:  redness, tenderness, or signs of infection (pain, swelling, redness, odor or green/yellow discharge around incision site)   Complete by: As directed    Call MD for:  severe uncontrolled pain   Complete by: As directed    Call MD for:  temperature >100.4   Complete by: As directed    Diet - low sodium heart healthy   Complete by: As directed    Increase activity slowly   Complete by: As directed    Sexual  Activity Restrictions   Complete by: As directed    Pelvic rest x 4 weeks     Allergies as of 01/16/2020      Reactions   Keflex [cephalexin] Anaphylaxis   Flagyl [metronidazole] Diarrhea      Medication List    STOP taking these medications   ibuprofen 800 MG tablet Commonly known as: ADVIL Replaced by: ibuprofen 100 MG chewable tablet   megestrol 20 MG tablet Commonly known as: MEGACE   metroNIDAZOLE 500 MG tablet Commonly known as: FLAGYL   norethindrone 5 MG tablet Commonly known as: Aygestin   ondansetron 4 MG tablet Commonly known as: ZOFRAN   traMADol 50 MG tablet Commonly known as: ULTRAM     TAKE these medications   Ajovy 225 MG/1.5ML Soaj Generic drug: Fremanezumab-vfrm Inject 225 mg into the skin every 30 (thirty) days.   ARIPiprazole 5 MG tablet Commonly known as: ABILIFY Take 1 tablet (5 mg total) by mouth daily.   Austedo 9 MG Tabs Generic drug: Deutetrabenazine Take 18 mg by mouth 2 (two) times daily.   beclomethasone 40 MCG/ACT inhaler Commonly known as: QVAR Inhale 1 puff into the lungs 2 (two) times daily as needed (asthma).   Botox 100 units Solr injection Generic drug: botulinum toxin Type A Inject 200 Units into the muscle every 3 (three) months. Inject into head and neck muscles   clonazePAM 1 MG tablet Commonly known as: KLONOPIN Take 1 mg by mouth  4 (four) times daily.   gabapentin 300 MG capsule Commonly known as: NEURONTIN Take 1 capsule (300 mg total) by mouth 3 (three) times daily as needed. What changed: reasons to take this   hydrOXYzine 25 MG tablet Commonly known as: ATARAX/VISTARIL TAKE 1 TABLET BY MOUTH 3 TIMES DAILY AS NEEDED. What changed: reasons to take this   ibuprofen 100 MG chewable tablet Commonly known as: ADVIL Chew 8 tablets (800 mg total) by mouth 3 (three) times daily. Replaces: ibuprofen 800 MG tablet   oxyCODONE-acetaminophen 5-325 MG tablet Commonly known as: PERCOCET/ROXICET Take 1 tablet by  mouth every 4 (four) hours as needed for moderate pain.   SUMAtriptan 100 MG tablet Commonly known as: IMITREX Take 1 tablet (100 mg total) by mouth every 2 (two) hours as needed for migraine. MAY DOSE 2 PER DAY OR 3   traZODone 50 MG tablet Commonly known as: DESYREL TAKE 1 TABLET BY MOUTH AT BEDTIME AS NEEDED FOR SLEEP. What changed:   reasons to take this  additional instructions   Viibryd 20 MG Tabs Generic drug: Vilazodone HCl Take 1 tablet (20 mg total) by mouth daily.   zolpidem 10 MG tablet Commonly known as: AMBIEN Take 10 mg by mouth at bedtime.       Follow-up Information    [facility]. Schedule an appointment as soon as possible for a visit in 1 month.   Why: Post op appt with Dr. Gardiner Fanti Contact information: [address] [address] [phone]              Signed: Chancy Milroy 01/16/2020, 7:42 AM

## 2020-01-20 ENCOUNTER — Emergency Department (HOSPITAL_COMMUNITY): Payer: Medicaid Other

## 2020-01-20 ENCOUNTER — Emergency Department (HOSPITAL_COMMUNITY)
Admission: EM | Admit: 2020-01-20 | Discharge: 2020-01-20 | Disposition: A | Discharge status: Home / Self Care | Payer: Medicaid Other | Attending: Emergency Medicine | Admitting: Emergency Medicine

## 2020-01-20 ENCOUNTER — Other Ambulatory Visit: Payer: Self-pay

## 2020-01-20 ENCOUNTER — Encounter (HOSPITAL_COMMUNITY): Payer: Self-pay | Admitting: Emergency Medicine

## 2020-01-20 DIAGNOSIS — R0789 Other chest pain: Secondary | ICD-10-CM | POA: Diagnosis not present

## 2020-01-20 DIAGNOSIS — F17210 Nicotine dependence, cigarettes, uncomplicated: Secondary | ICD-10-CM | POA: Insufficient documentation

## 2020-01-20 DIAGNOSIS — R001 Bradycardia, unspecified: Secondary | ICD-10-CM | POA: Insufficient documentation

## 2020-01-20 DIAGNOSIS — J45909 Unspecified asthma, uncomplicated: Secondary | ICD-10-CM | POA: Insufficient documentation

## 2020-01-20 DIAGNOSIS — R1030 Lower abdominal pain, unspecified: Secondary | ICD-10-CM | POA: Diagnosis not present

## 2020-01-20 DIAGNOSIS — R079 Chest pain, unspecified: Secondary | ICD-10-CM | POA: Diagnosis present

## 2020-01-20 LAB — HEPATIC FUNCTION PANEL
ALT: 42 U/L (ref 0–44)
AST: 26 U/L (ref 15–41)
Albumin: 3.5 g/dL (ref 3.5–5.0)
Alkaline Phosphatase: 68 U/L (ref 38–126)
Bilirubin, Direct: <0.1 mg/dL (ref 0.0–0.2)
Total Bilirubin: 0.9 mg/dL (ref 0.3–1.2)
Total Protein: 6.2 g/dL — ABNORMAL LOW (ref 6.5–8.1)

## 2020-01-20 LAB — CBC
HCT: 47.3 % — ABNORMAL HIGH (ref 36.0–46.0)
Hemoglobin: 15.8 g/dL — ABNORMAL HIGH (ref 12.0–15.0)
MCH: 29 pg (ref 26.0–34.0)
MCHC: 33.4 g/dL (ref 30.0–36.0)
MCV: 86.9 fL (ref 80.0–100.0)
Platelets: 308 10*3/uL (ref 150–400)
RBC: 5.44 MIL/uL — ABNORMAL HIGH (ref 3.87–5.11)
RDW: 11.9 % (ref 11.5–15.5)
WBC: 6.6 10*3/uL (ref 4.0–10.5)
nRBC: 0 % (ref 0.0–0.2)

## 2020-01-20 LAB — URINALYSIS, ROUTINE W REFLEX MICROSCOPIC
Bacteria, UA: NONE SEEN
Bilirubin Urine: NEGATIVE
Glucose, UA: NEGATIVE mg/dL
Ketones, ur: NEGATIVE mg/dL
Leukocytes,Ua: NEGATIVE
Nitrite: NEGATIVE
Protein, ur: NEGATIVE mg/dL
Specific Gravity, Urine: >1.046 — ABNORMAL HIGH (ref 1.005–1.030)
pH: 5 (ref 5.0–8.0)

## 2020-01-20 LAB — BASIC METABOLIC PANEL
Anion gap: 14 (ref 5–15)
BUN: 8 mg/dL (ref 6–20)
CO2: 21 mmol/L — ABNORMAL LOW (ref 22–32)
Calcium: 9.5 mg/dL (ref 8.9–10.3)
Chloride: 106 mmol/L (ref 98–111)
Creatinine, Ser: 0.79 mg/dL (ref 0.44–1.00)
GFR, Estimated: >60 mL/min (ref >60)
Glucose, Bld: 113 mg/dL — ABNORMAL HIGH (ref 70–99)
Potassium: 4.9 mmol/L (ref 3.5–5.1)
Sodium: 141 mmol/L (ref 135–145)

## 2020-01-20 LAB — LIPASE, BLOOD: Lipase: 29 U/L (ref 11–51)

## 2020-01-20 LAB — TROPONIN I (HIGH SENSITIVITY)
Troponin I (High Sensitivity): 3 ng/L (ref <18)
Troponin I (High Sensitivity): 3 ng/L (ref <18)

## 2020-01-20 MED ORDER — SODIUM CHLORIDE 0.9 % IV BOLUS
1000.0000 mL | Freq: Once | INTRAVENOUS | Status: AC
  Administered 2020-01-20: 1000 mL via INTRAVENOUS

## 2020-01-20 MED ORDER — OXYCODONE-ACETAMINOPHEN 5-325 MG PO TABS
2.0000 | ORAL_TABLET | Freq: Once | ORAL | Status: AC
  Administered 2020-01-20: 2 via ORAL
  Filled 2020-01-20: qty 2

## 2020-01-20 MED ORDER — ALBUTEROL SULFATE HFA 108 (90 BASE) MCG/ACT IN AERS
2.0000 | INHALATION_SPRAY | Freq: Once | RESPIRATORY_TRACT | Status: AC
  Administered 2020-01-20: 2 via RESPIRATORY_TRACT
  Filled 2020-01-20: qty 6.7

## 2020-01-20 MED ORDER — ONDANSETRON 4 MG PO TBDP: ORAL_TABLET | ORAL | 0 refills | Status: DC

## 2020-01-20 MED ORDER — IOHEXOL 350 MG/ML SOLN
100.0000 mL | Freq: Once | INTRAVENOUS | Status: AC | PRN
  Administered 2020-01-20: 100 mL via INTRAVENOUS

## 2020-01-20 MED ORDER — ONDANSETRON HCL 4 MG/2ML IJ SOLN
4.0000 mg | Freq: Once | INTRAMUSCULAR | Status: AC
  Administered 2020-01-20: 4 mg via INTRAVENOUS
  Filled 2020-01-20: qty 2

## 2020-01-20 MED ORDER — MORPHINE SULFATE (PF) 4 MG/ML IV SOLN
4.0000 mg | Freq: Once | INTRAVENOUS | Status: AC
  Administered 2020-01-20: 4 mg via INTRAVENOUS
  Filled 2020-01-20: qty 1

## 2020-01-20 MED ORDER — OXYCODONE-ACETAMINOPHEN 5-325 MG PO TABS
1.0000 | ORAL_TABLET | Freq: Four times a day (QID) | ORAL | 0 refills | Status: DC | PRN
Start: 2020-01-20 — End: 2020-01-21

## 2020-01-20 NOTE — ED Provider Notes (Signed)
[facility] EMERGENCY DEPARTMENT Provider Note   CSN: [id number] Arrival date & time: 01/20/20  1308     History Chief Complaint  Patient presents with  . Chest Pain  . Abdominal Pain    Carmen Daniels is a 34 y.o. female.  Carmen Daniels is a 34 y.o. female with history of fibromyalgia, chronic pain syndrome, migraines, OCD, schizophrenia, bipolar disorder, borderline personality disorder, who presents to the emergency department for evaluation of chest pain, shortness of breath and lower abdominal pain.  Patient is 5 days postop after a vaginal hysterectomy done by Dr. Laurena Bering.  Patient did not have any immediate complications after the surgery, and was having some lower abdominal pain as to be expected, but she reports that this pain has been getting worse over the past 2 days and has not been well controlled with her home 5 mg Percocets she has been taking these every 4 hours and has also been taking ibuprofen but states that it has not been improving her pain.  Yesterday she also began experiencing some central chest pains that she describes as a pressure-like sensation and also reports feeling like her chest is tight.  She also reports some shortness of breath.  She does think this sometimes gets worse with deep breath.  She does report some associated nausea but no vomiting.  No lightheadedness or syncope.  No diaphoresis.  She has no history of cardiac or lung problems aside from asthma, but states this feels somewhat different from her asthma.  Patient has not tried to use her albuterol inhaler because she states that it was expired.  She has not on any blood thinners, no history of PE or DVT, she states prior to the surgery she was on combination OCPs but those have been stopped after her hysterectomy.  She has been urinating and moving her bowels regularly.  She states that she still having some vaginal spotting that she describes as light pink, no increasing or heavy  bleeding or passing of clots.  No fevers or chills.  No low back pain.  No difficulty ambulating.  No other aggravating or alleviating factors.  Patient given aspirin by EMS prior to arrival.  She has not called her surgeon regarding worsening pain.        Past Medical History:  Diagnosis Date  . Acute respiratory failure with hypoxia ([place])   . Acute rhinosinusitis 07/03/2018  . Allergic rhinitis   . Anxiety   . Asthma   . Back pain   . Bipolar disorder ([place])   . Borderline personality disorder ([place])   . Bupropion overdose   . Chronic pain   . Chronic pain syndrome   . Depression   . Fibromyalgia    generalized  . Hallucinations   . Migraines   . Nasal polyps   . Nausea   . Neck pain   . OCD (obsessive compulsive disorder)   . OCD (obsessive compulsive disorder)   . Ovarian cyst    left  . Pneumonia    2021  . PONV (postoperative nausea and vomiting)   . Schizophrenia ([place])    schizoaffective    Patient Active Problem List   Diagnosis Date Noted  . DUB (dysfunctional uterine bleeding)   . Tardive dyskinesia 11/14/2019  . Bipolar I disorder, most recent episode depressed ([place]) 09/13/2019  . Social anxiety disorder 09/13/2019  . Adjustment disorder with mixed anxiety and depressed mood 07/31/2019  . Suicidal ideation   .  Post-operative state 07/19/2019  . Seizures ([place]) 06/08/2019  . Bipolar 1 disorder ([place]) 05/17/2019  . Status epilepticus ([place]) 05/17/2019  . Intentional benzodiazepine overdose ([place]) 03/29/2019  . Menorrhagia 02/28/2019  . Intractable chronic 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 status migrainosus 02/08/2019  . History of elective abortion 12/28/2018  . Unwanted fertility 12/28/2018  . QT prolongation 07/03/2018  . AMS (altered mental status) 07/03/2018  . Serum total bilirubin elevated 07/03/2018  . Syncope 07/02/2018  . Sinus congestion 04/13/2017  . Borderline personality disorder ([place]) 04/11/2017  . Bipolar I disorder, current or most  recent episode manic, with psychotic features ([place]) 04/10/2017  . Bipolar disorder ([place]) 04/10/2017  . Abnormal MRI of head 04/21/2016  . Chronic migraine without aura 03/27/2015  . Mild persistent asthma 12/30/2014  . Allergic rhinitis due to pollen 12/30/2014  . 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 Nasal polyposis 12/30/2014    Past Surgical History:  Procedure Laterality Date  . LAPAROSCOPIC TUBAL LIGATION Bilateral 06/13/2019   Procedure: LAPAROSCOPIC TUBAL LIGATION;  Surgeon: Chancy Milroy, MD;  Location: [facility];  Service: Gynecology;  Laterality: Bilateral;  FILSHIE CLIPS  . NASAL SINUS SURGERY    . TUBAL LIGATION    . tubes in ears    . VAGINAL HYSTERECTOMY Bilateral 01/15/2020   Procedure: HYSTERECTOMY VAGINAL;  Surgeon: Chancy Milroy, MD;  Location: [facility];  Service: Gynecology;  Laterality: Bilateral;  . WISDOM TOOTH EXTRACTION       OB History   No obstetric history on file.     Family History  Problem Relation Age of Onset  . Healthy Father   . Healthy Mother   . Arthritis Maternal Grandmother   . Arthritis Maternal Grandfather   . Depression Paternal Grandmother     Social History   Tobacco Use  . Smoking status: Current Every Day Smoker    Packs/day: 0.50    Years: 15.00    Pack years: 7.50    Types: Cigarettes  . Smokeless tobacco: Never Used  Vaping Use  . Vaping Use: Every day  . Substances: Nicotine  Substance Use Topics  . Alcohol use: Never    Alcohol/week: 0.0 standard drinks  . Drug use: No    Home Medications Prior to Admission medications   Medication Sig Start Date End Date Taking? Authorizing Provider  ARIPiprazole (ABILIFY) 5 MG tablet Take 1 tablet (5 mg total) by mouth daily. 11/26/19  Yes Eulis Canner E, NP  beclomethasone (QVAR) 40 MCG/ACT inhaler Inhale 1 puff into the lungs 2 (two) times daily as needed (asthma).    Yes [provider]  botulinum toxin Type A (BOTOX) 100 units SOLR  injection Inject 200 Units into the muscle every 3 (three) months. Inject into head and neck muscles 04/30/19  Yes Marcial Pacas, MD  clonazePAM (KLONOPIN) 1 MG tablet Take 1 mg by mouth 4 (four) times daily.  07/14/19  Yes [provider]  Deutetrabenazine (AUSTEDO) 9 MG TABS Take 18 mg by mouth 2 (two) times daily. 11/26/19  Yes Eulis Canner E, NP  Fremanezumab-vfrm (AJOVY) 225 MG/1.5ML SOAJ Inject 225 mg into the skin every 30 (thirty) days. 12/12/19  Yes Marcial Pacas, MD  gabapentin (NEURONTIN) 300 MG capsule Take 1 capsule (300 mg total) by mouth 3 (three) times daily as needed. Patient taking differently: Take 300 mg by mouth 3 (three) times daily as needed (pain).  11/26/19  Yes Eulis Canner E, NP  hydrOXYzine (ATARAX/VISTARIL) 25 MG tablet TAKE 1 TABLET  BY MOUTH 3 TIMES DAILY AS NEEDED. Patient taking differently: Take 25 mg by mouth 3 (three) times daily as needed for anxiety.  01/09/20  Yes Eulis Canner E, NP  ibuprofen (ADVIL) 100 MG chewable tablet Chew 8 tablets (800 mg total) by mouth 3 (three) times daily. 01/16/20  Yes Chancy Milroy, MD  oxyCODONE-acetaminophen (PERCOCET/ROXICET) 5-325 MG tablet Take 1 tablet by mouth every 4 (four) hours as needed for moderate pain. 01/16/20  Yes Chancy Milroy, MD  SUMAtriptan (IMITREX) 100 MG tablet Take 1 tablet (100 mg total) by mouth every 2 (two) hours as needed for migraine. MAY DOSE 2 PER DAY OR 3 12/12/19  Yes Marcial Pacas, MD  traZODone (DESYREL) 50 MG tablet TAKE 1 TABLET BY MOUTH AT BEDTIME AS NEEDED FOR SLEEP. Patient taking differently: Take 50 mg by mouth at bedtime as needed for sleep.  12/12/19  Yes Eulis Canner E, NP  Vilazodone HCl (VIIBRYD) 20 MG TABS Take 1 tablet (20 mg total) by mouth daily. 11/26/19  Yes Eulis Canner E, NP  zolpidem (AMBIEN) 10 MG tablet Take 10 mg by mouth at bedtime. 12/31/19  Yes [provider]    Allergies    Keflex [cephalexin] and Flagyl [metronidazole]  Review of  Systems   Review of Systems  Constitutional: Negative for chills and fever.  HENT: Negative.   Respiratory: Positive for chest tightness and shortness of breath. Negative for cough and wheezing.   Cardiovascular: Positive for chest pain. Negative for palpitations and leg swelling.  Gastrointestinal: Positive for abdominal pain and nausea. Negative for blood in stool, constipation, diarrhea and vomiting.  Genitourinary: Positive for pelvic pain and vaginal bleeding. Negative for dysuria, flank pain, frequency, hematuria and vaginal discharge.  Musculoskeletal: Negative for arthralgias and myalgias.  Skin: Negative for color change and rash.  Neurological: Negative for dizziness, syncope, weakness, light-headedness and numbness.  All other systems reviewed and are negative.   Physical Exam Updated Vital Signs BP 129/84   Pulse (!) 42   Temp 98.1 F (36.7 C) (Oral)   Resp 15   LMP  (LMP Unknown)   SpO2 100%   Physical Exam Vitals and nursing note reviewed.  Constitutional:      General: She is not in acute distress.    Appearance: She is well-developed. She is not diaphoretic.     Comments: Patient is alert, she appears uncomfortable but is nontoxic appearing and in no acute distress  HENT:     Head: Normocephalic and atraumatic.  Eyes:     General:        Right eye: No discharge.        Left eye: No discharge.  Cardiovascular:     Rate and Rhythm: Regular rhythm. Bradycardia present.     Pulses:          Radial pulses are 2+ on the right side and 2+ on the left side.       Dorsalis pedis pulses are 2+ on the right side and 2+ on the left side.     Heart sounds: Normal heart sounds. No murmur heard.  No friction rub. No gallop.      Comments: Bradycardia with regular rhythm Pulmonary:     Effort: Pulmonary effort is normal. No respiratory distress.     Breath sounds: Normal breath sounds. No wheezing or rales.     Comments: Respirations equal and unlabored, patient able to  speak in full sentences, lungs clear to auscultation bilaterally Chest:  Chest wall: Tenderness present.     Comments: There is some anterior chest wall tenderness noted, no overlying skin changes or palpable deformity Abdominal:     General: Bowel sounds are normal. There is no distension.     Palpations: Abdomen is soft. There is no mass.     Tenderness: There is abdominal tenderness. There is no guarding.     Comments: Abdomen is soft, nondistended, bowel sounds present throughout, patient has some generalized tenderness in all quadrants but pain most notable across the lower abdomen, she states it is worse in the suprapubic region, some voluntary guarding noted, no CVA tenderness bilaterally.  Musculoskeletal:        General: No deformity.     Cervical back: Neck supple.     Right lower leg: No tenderness. No edema.     Left lower leg: No tenderness. No edema.  Skin:    General: Skin is warm and dry.     Capillary Refill: Capillary refill takes less than 2 seconds.  Neurological:     Mental Status: She is alert.     Coordination: Coordination normal.     Comments: Speech is clear, able to follow commands Moves extremities without ataxia, coordination intact  Psychiatric:        Mood and Affect: Mood normal.        Behavior: Behavior normal.     ED Results / Procedures / Treatments   Labs (all labs ordered are listed, but only abnormal results are displayed) Labs Reviewed  BASIC METABOLIC PANEL - Abnormal; Notable for the following components:      Result Value   CO2 21 (*)    Glucose, Bld 113 (*)    All other components within normal limits  CBC - Abnormal; Notable for the following components:   RBC 5.44 (*)    Hemoglobin 15.8 (*)    HCT 47.3 (*)    All other components within normal limits  URINALYSIS, ROUTINE W REFLEX MICROSCOPIC  HEPATIC FUNCTION PANEL  LIPASE, BLOOD  TROPONIN I (HIGH SENSITIVITY)  TROPONIN I (HIGH SENSITIVITY)    EKG None  Radiology DG  Chest 2 View  Result Date: 01/20/2020 CLINICAL DATA:  Chest pain and shortness of breath. Hysterectomy 5 days ago. EXAM: CHEST - 2 VIEW COMPARISON:  04/05/2019 FINDINGS: The heart size and mediastinal contours are within normal limits. Both lungs are clear. The visualized skeletal structures are unremarkable. IMPRESSION: No active cardiopulmonary disease. Electronically Signed   By: Kerby Moors M.D.   On: 01/20/2020 14:03   CT Angio Chest PE W and/or Wo Contrast  Result Date: 01/20/2020 CLINICAL DATA:  Lower abdominal pain, chest pain/pressure for 2 days, and shortness of breath. The patient is postoperative day 5 status post hysterectomy. EXAM: CT ANGIOGRAPHY CHEST CT ABDOMEN AND PELVIS WITH CONTRAST TECHNIQUE: Multidetector CT imaging of the chest was performed using the standard protocol during bolus administration of intravenous contrast. Multiplanar CT image reconstructions and MIPs were obtained to evaluate the vascular anatomy. Multidetector CT imaging of the abdomen and pelvis was performed using the standard protocol during bolus administration of intravenous contrast. CONTRAST:  147mL OMNIPAQUE IOHEXOL 350 MG/ML SOLN COMPARISON:  Chest radiograph performed the same day, CT abdomen pelvis dated 06/21/2019. FINDINGS: CTA CHEST FINDINGS Cardiovascular: Satisfactory opacification of the pulmonary arteries to the segmental level. No evidence of pulmonary embolism. Normal heart size. No pericardial effusion. Mediastinum/Nodes: No enlarged mediastinal, hilar, or axillary lymph nodes. Thyroid gland, trachea, and esophagus demonstrate no significant findings.  Lungs/Pleura: There are small bilateral pleural effusions with associated atelectasis. There is no pneumothorax. Musculoskeletal: No chest wall abnormality. No acute or significant osseous findings. Review of the MIP images confirms the above findings. CT ABDOMEN and PELVIS FINDINGS Hepatobiliary: No focal liver abnormality is seen. No gallstones,  gallbladder wall thickening, or biliary dilatation. Pancreas: Unremarkable. No pancreatic ductal dilatation or surrounding inflammatory changes. Spleen: Normal in size without focal abnormality. Adrenals/Urinary Tract: Adrenal glands are unremarkable. Kidneys are normal, without renal calculi, focal lesion, or hydronephrosis. Bladder is unremarkable. Stomach/Bowel: Stomach is within normal limits. Appendix appears normal. A few scattered colonic diverticula are noted. No evidence of bowel wall thickening, distention, or inflammatory changes. Vascular/Lymphatic: No significant vascular findings are present. No enlarged abdominal or pelvic lymph nodes. Reproductive: The patient is status post hysterectomy. No suspicious adnexal masses are identified. A small amount of gas is seen in the vagina. No fluid collection to suggest abscess is identified in the operative bed. Other: No abdominal wall hernia or abnormality. No abdominopelvic ascites. Musculoskeletal: No acute or significant osseous findings. Review of the MIP images confirms the above findings. IMPRESSION: 1. No evidence of pulmonary embolism. 2. Small bilateral pleural effusions with associated atelectasis. 3. No acute process in the abdomen or pelvis. No findings to explain abdominal pain. Electronically Signed   By: Zerita Boers M.D.   On: 01/20/2020 19:54   CT ABDOMEN PELVIS W CONTRAST  Result Date: 01/20/2020 CLINICAL DATA:  Lower abdominal pain, chest pain/pressure for 2 days, and shortness of breath. The patient is postoperative day 5 status post hysterectomy. EXAM: CT ANGIOGRAPHY CHEST CT ABDOMEN AND PELVIS WITH CONTRAST TECHNIQUE: Multidetector CT imaging of the chest was performed using the standard protocol during bolus administration of intravenous contrast. Multiplanar CT image reconstructions and MIPs were obtained to evaluate the vascular anatomy. Multidetector CT imaging of the abdomen and pelvis was performed using the standard protocol  during bolus administration of intravenous contrast. CONTRAST:  149mL OMNIPAQUE IOHEXOL 350 MG/ML SOLN COMPARISON:  Chest radiograph performed the same day, CT abdomen pelvis dated 06/21/2019. FINDINGS: CTA CHEST FINDINGS Cardiovascular: Satisfactory opacification of the pulmonary arteries to the segmental level. No evidence of pulmonary embolism. Normal heart size. No pericardial effusion. Mediastinum/Nodes: No enlarged mediastinal, hilar, or axillary lymph nodes. Thyroid gland, trachea, and esophagus demonstrate no significant findings. Lungs/Pleura: There are small bilateral pleural effusions with associated atelectasis. There is no pneumothorax. Musculoskeletal: No chest wall abnormality. No acute or significant osseous findings. Review of the MIP images confirms the above findings. CT ABDOMEN and PELVIS FINDINGS Hepatobiliary: No focal liver abnormality is seen. No gallstones, gallbladder wall thickening, or biliary dilatation. Pancreas: Unremarkable. No pancreatic ductal dilatation or surrounding inflammatory changes. Spleen: Normal in size without focal abnormality. Adrenals/Urinary Tract: Adrenal glands are unremarkable. Kidneys are normal, without renal calculi, focal lesion, or hydronephrosis. Bladder is unremarkable. Stomach/Bowel: Stomach is within normal limits. Appendix appears normal. A few scattered colonic diverticula are noted. No evidence of bowel wall thickening, distention, or inflammatory changes. Vascular/Lymphatic: No significant vascular findings are present. No enlarged abdominal or pelvic lymph nodes. Reproductive: The patient is status post hysterectomy. No suspicious adnexal masses are identified. A small amount of gas is seen in the vagina. No fluid collection to suggest abscess is identified in the operative bed. Other: No abdominal wall hernia or abnormality. No abdominopelvic ascites. Musculoskeletal: No acute or significant osseous findings. Review of the MIP images confirms the  above findings. IMPRESSION: 1. No evidence of pulmonary embolism. 2. Small  bilateral pleural effusions with associated atelectasis. 3. No acute process in the abdomen or pelvis. No findings to explain abdominal pain. Electronically Signed   By: Zerita Boers M.D.   On: 01/20/2020 19:54    Procedures Procedures (including critical care time)  Medications Ordered in ED Medications  ondansetron (ZOFRAN) injection 4 mg (4 mg Intravenous Given 01/20/20 1752)  morphine 4 MG/ML injection 4 mg (4 mg Intravenous Given 01/20/20 1753)  sodium chloride 0.9 % bolus 1,000 mL (1,000 mLs Intravenous New Bag/Given 01/20/20 1759)    ED Course  I have reviewed the triage vital signs and the nursing notes.  Pertinent labs & imaging results that were available during my care of the patient were reviewed by me and considered in my medical decision making (see chart for details).    MDM Rules/Calculators/A&P                         34 year old female with recent vaginal hysterectomy presents with chest pain and shortness of breath as well as worsening lower abdominal pain.  On arrival vitals are normal and patient appears uncomfortable but is in no acute distress.  She is not having any increased vaginal bleeding, no fevers, no urinary symptoms.  Does not have history of heart or lung disease aside from asthma.  Given recent surgery concern for PE, will also check cardiac enzymes as well as basic labs, and patient will need CT abdomen pelvis to rule out postoperative complication such as abscess, fluid collection, vaginal cuff dehiscence.  Will treat pain while awaiting work-up.  I have independently ordered, reviewed and interpreted all labs and imaging: CBC: No leukocytosis, normal hemoglobin BMP: CO2 of 21 and glucose of 113 but no other electrolyte derangements, normal renal function LFTs: Unremarkable Lipase: WNL Urinalysis: Without signs of infection Troponin: Negative x2  EKG, sinus bradycardia, no  concerning ischemic changes  Chest x-ray: No active cardiopulmonary disease  CTA of the chest without any evidence of pulmonary embolism, there is evidence of a small bilateral pleural effusion with some associated atelectasis, but no pneumonia no other acute cardiopulmonary disease noted on CT  CT of the abdomen and pelvis without evidence of abscess or other postoperative complication.  Patient's pain has been better controlled here in the ED.  Discussed with her that she can take 1 to 2 tablets of home pain medication as needed, given additional supply of pain medication and encouraged to call her surgeon first thing for close follow-up.  Work-up overall today has been reassuring.  She was also given a new albuterol inhaler since hers at home has been expired.  Discharged home in good condition.  Final Clinical Impression(s) / ED Diagnoses Final diagnoses:  Central chest pain  Lower abdominal pain    Rx / DC Orders ED Discharge Orders         Ordered    ondansetron (ZOFRAN ODT) 4 MG disintegrating tablet        01/20/20 2222    oxyCODONE-acetaminophen (PERCOCET) 5-325 MG tablet  Every 6 hours PRN,   Status:  Discontinued        01/20/20 2222           Jacqlyn Larsen, PA-C 01/22/20 0314    Little, Wenda Overland, MD 02/10/20 [phone]

## 2020-01-20 NOTE — ED Triage Notes (Addendum)
Pt to triage via [facility] from home.  Reports 5 days post-op from hysterectomy.  C/o lower abd pain.  Also reports chest pain/pressure x 2 days that is worse with inspiration.  Also reports SOB.  ASA 324mg  given PTA by EMS.

## 2020-01-20 NOTE — ED Notes (Signed)
Discharge instructions discussed with pt and mother at bedside. Pt was able to verbalized understanding with no questions at this time. Pt to go home with parent at bedside

## 2020-01-20 NOTE — ED Notes (Signed)
Per lab d-dimer need to be redrawn d/t hemolyze. Primary Rn informed

## 2020-01-20 NOTE — Discharge Instructions (Addendum)
Your evaluation today was reassuring, we do not see signs of a blood clot or problem with your heart, we do see some atelectasis on your chest CT which is not uncommon after a surgery and recovery.  You can use albuterol inhaler as needed for chest tightness or pain.  Your abdominal imaging did not show any signs of postop complication after your hysterectomy and your lab work overall today looks good.  You can try using 10 mg instead of 5 mg of oxycodone as needed for pain at home since your pain has not been well controlled, but please call first thing tomorrow morning to discuss this pain with your surgeon for close follow-up.  Return if you develop fevers, worsening pain, difficulty breathing or any other new or concerning symptoms.

## 2020-01-21 ENCOUNTER — Other Ambulatory Visit: Payer: Self-pay | Admitting: Obstetrics and Gynecology

## 2020-01-21 MED ORDER — OXYCODONE-ACETAMINOPHEN 5-325 MG PO TABS: 1.0000 | ORAL_TABLET | ORAL | 0 refills | Status: DC | PRN

## 2020-01-21 NOTE — Progress Notes (Signed)
Rx for pain medication sent to Pharmacy. Pt is post op 6 days from [facility] with pain.

## 2020-01-25 ENCOUNTER — Other Ambulatory Visit: Payer: Self-pay

## 2020-01-25 ENCOUNTER — Emergency Department (HOSPITAL_COMMUNITY)
Admission: EM | Admit: 2020-01-25 | Discharge: 2020-01-26 | Disposition: A | Discharge status: Home / Self Care | Payer: Medicaid Other | Attending: Emergency Medicine | Admitting: Emergency Medicine

## 2020-01-25 ENCOUNTER — Emergency Department (HOSPITAL_COMMUNITY): Payer: Medicaid Other

## 2020-01-25 DIAGNOSIS — R079 Chest pain, unspecified: Secondary | ICD-10-CM | POA: Insufficient documentation

## 2020-01-25 DIAGNOSIS — J4530 Mild persistent asthma, uncomplicated: Secondary | ICD-10-CM | POA: Insufficient documentation

## 2020-01-25 DIAGNOSIS — R0602 Shortness of breath: Secondary | ICD-10-CM | POA: Diagnosis present

## 2020-01-25 DIAGNOSIS — Z7951 Long term (current) use of inhaled steroids: Secondary | ICD-10-CM | POA: Diagnosis not present

## 2020-01-25 DIAGNOSIS — F17210 Nicotine dependence, cigarettes, uncomplicated: Secondary | ICD-10-CM | POA: Insufficient documentation

## 2020-01-25 DIAGNOSIS — R1030 Lower abdominal pain, unspecified: Secondary | ICD-10-CM | POA: Diagnosis not present

## 2020-01-25 LAB — CBC
HCT: 45.1 % (ref 36.0–46.0)
Hemoglobin: 15.9 g/dL — ABNORMAL HIGH (ref 12.0–15.0)
MCH: 30.3 pg (ref 26.0–34.0)
MCHC: 35.3 g/dL (ref 30.0–36.0)
MCV: 86.1 fL (ref 80.0–100.0)
Platelets: 340 10*3/uL (ref 150–400)
RBC: 5.24 MIL/uL — ABNORMAL HIGH (ref 3.87–5.11)
RDW: 11.9 % (ref 11.5–15.5)
WBC: 8.5 10*3/uL (ref 4.0–10.5)
nRBC: 0 % (ref 0.0–0.2)

## 2020-01-25 LAB — BASIC METABOLIC PANEL
Anion gap: 12 (ref 5–15)
BUN: 7 mg/dL (ref 6–20)
CO2: 21 mmol/L — ABNORMAL LOW (ref 22–32)
Calcium: 9.5 mg/dL (ref 8.9–10.3)
Chloride: 103 mmol/L (ref 98–111)
Creatinine, Ser: 0.8 mg/dL (ref 0.44–1.00)
GFR, Estimated: >60 mL/min (ref >60)
Glucose, Bld: 102 mg/dL — ABNORMAL HIGH (ref 70–99)
Potassium: 3.8 mmol/L (ref 3.5–5.1)
Sodium: 136 mmol/L (ref 135–145)

## 2020-01-25 LAB — TROPONIN I (HIGH SENSITIVITY)
Troponin I (High Sensitivity): 3 ng/L (ref <18)
Troponin I (High Sensitivity): 3 ng/L (ref <18)

## 2020-01-25 NOTE — ED Triage Notes (Signed)
Pt reports having hysterectomy one week ago, shortly thereafter developed shob and chest pain. Was told she had a "partially collapsed lung" and was told to come back if worsening.

## 2020-01-26 ENCOUNTER — Encounter (HOSPITAL_BASED_OUTPATIENT_CLINIC_OR_DEPARTMENT_OTHER): Payer: Self-pay | Admitting: Emergency Medicine

## 2020-01-26 ENCOUNTER — Other Ambulatory Visit: Payer: Self-pay

## 2020-01-26 ENCOUNTER — Emergency Department (HOSPITAL_BASED_OUTPATIENT_CLINIC_OR_DEPARTMENT_OTHER)
Admission: EM | Admit: 2020-01-26 | Discharge: 2020-01-26 | Disposition: A | Discharge status: Home / Self Care | Payer: Medicaid Other | Source: Home / Self Care | Attending: Emergency Medicine | Admitting: Emergency Medicine

## 2020-01-26 DIAGNOSIS — F17210 Nicotine dependence, cigarettes, uncomplicated: Secondary | ICD-10-CM | POA: Insufficient documentation

## 2020-01-26 DIAGNOSIS — Z7951 Long term (current) use of inhaled steroids: Secondary | ICD-10-CM | POA: Insufficient documentation

## 2020-01-26 DIAGNOSIS — J4530 Mild persistent asthma, uncomplicated: Secondary | ICD-10-CM | POA: Insufficient documentation

## 2020-01-26 DIAGNOSIS — R1030 Lower abdominal pain, unspecified: Secondary | ICD-10-CM | POA: Insufficient documentation

## 2020-01-26 DIAGNOSIS — R109 Unspecified abdominal pain: Secondary | ICD-10-CM

## 2020-01-26 DIAGNOSIS — R079 Chest pain, unspecified: Secondary | ICD-10-CM | POA: Insufficient documentation

## 2020-01-26 DIAGNOSIS — R0602 Shortness of breath: Secondary | ICD-10-CM | POA: Insufficient documentation

## 2020-01-26 MED ORDER — HYDROMORPHONE HCL 1 MG/ML IJ SOLN
1.0000 mg | Freq: Once | INTRAMUSCULAR | Status: AC
  Administered 2020-01-26: 1 mg via INTRAMUSCULAR
  Filled 2020-01-26: qty 1

## 2020-01-26 NOTE — ED Provider Notes (Signed)
[facility] EMERGENCY DEPARTMENT Provider Note   CSN: [id number] Arrival date & time: 01/26/20  0454     History Chief Complaint  Patient presents with  . Abdominal Pain  . Chest Pain  . Shortness of Breath    Carmen Daniels is a 34 y.o. female.   Abdominal Pain Pain location:  Suprapubic Pain radiates to:  Does not radiate Pain severity:  No pain Onset quality:  Gradual Timing:  Constant Progression:  Worsening Chronicity:  New Associated symptoms: chest pain and shortness of breath   Chest Pain Associated symptoms: abdominal pain and shortness of breath   Shortness of Breath Associated symptoms: abdominal pain and chest pain        Past Medical History:  Diagnosis Date  . Acute respiratory failure with hypoxia ([place])   . Acute rhinosinusitis 07/03/2018  . Allergic rhinitis   . Anxiety   . Asthma   . Back pain   . Bipolar disorder ([place])   . Borderline personality disorder ([place])   . Bupropion overdose   . Chronic pain   . Chronic pain syndrome   . Depression   . Fibromyalgia    generalized  . Hallucinations   . Migraines   . Nasal polyps   . Nausea   . Neck pain   . OCD (obsessive compulsive disorder)   . OCD (obsessive compulsive disorder)   . Ovarian cyst    left  . Pneumonia    2021  . PONV (postoperative nausea and vomiting)   . Schizophrenia ([place])    schizoaffective    Patient Active Problem List   Diagnosis Date Noted  . DUB (dysfunctional uterine bleeding)   . Tardive dyskinesia 11/14/2019  . Bipolar I disorder, most recent episode depressed ([place]) 09/13/2019  . Social anxiety disorder 09/13/2019  . Adjustment disorder with mixed anxiety and depressed mood 07/31/2019  . Suicidal ideation   . Post-operative state 07/19/2019  . Seizures ([place]) 06/08/2019  . Bipolar 1 disorder ([place]) 05/17/2019  . Status epilepticus ([place]) 05/17/2019  . Intentional benzodiazepine overdose ([place]) 03/29/2019  . Menorrhagia 02/28/2019  .  Intractable chronic 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 status migrainosus 02/08/2019  . History of elective abortion 12/28/2018  . Unwanted fertility 12/28/2018  . QT prolongation 07/03/2018  . AMS (altered mental status) 07/03/2018  . Serum total bilirubin elevated 07/03/2018  . Syncope 07/02/2018  . Sinus congestion 04/13/2017  . Borderline personality disorder ([place]) 04/11/2017  . Bipolar I disorder, current or most recent episode manic, with psychotic features ([place]) 04/10/2017  . Bipolar disorder ([place]) 04/10/2017  . Abnormal MRI of head 04/21/2016  . Chronic migraine without aura 03/27/2015  . Mild persistent asthma 12/30/2014  . Allergic rhinitis due to pollen 12/30/2014  . 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 Nasal polyposis 12/30/2014    Past Surgical History:  Procedure Laterality Date  . LAPAROSCOPIC TUBAL LIGATION Bilateral 06/13/2019   Procedure: LAPAROSCOPIC TUBAL LIGATION;  Surgeon: Chancy Milroy, MD;  Location: [facility];  Service: Gynecology;  Laterality: Bilateral;  FILSHIE CLIPS  . NASAL SINUS SURGERY    . TUBAL LIGATION    . tubes in ears    . VAGINAL HYSTERECTOMY Bilateral 01/15/2020   Procedure: HYSTERECTOMY VAGINAL;  Surgeon: Chancy Milroy, MD;  Location: [facility];  Service: Gynecology;  Laterality: Bilateral;  . WISDOM TOOTH EXTRACTION       OB History   No obstetric history on file.     Family History  Problem  Relation Age of Onset  . Healthy Father   . Healthy Mother   . Arthritis Maternal Grandmother   . Arthritis Maternal Grandfather   . Depression Paternal Grandmother     Social History   Tobacco Use  . Smoking status: Current Every Day Smoker    Packs/day: 0.50    Years: 15.00    Pack years: 7.50    Types: Cigarettes  . Smokeless tobacco: Never Used  Vaping Use  . Vaping Use: Every day  . Substances: Nicotine  Substance Use Topics  . Alcohol use: Never    Alcohol/week: 0.0 standard drinks  . Drug use: No     Home Medications Prior to Admission medications   Medication Sig Start Date End Date Taking? Authorizing Provider  ARIPiprazole (ABILIFY) 5 MG tablet Take 1 tablet (5 mg total) by mouth daily. 11/26/19   Salley Slaughter, NP  beclomethasone (QVAR) 40 MCG/ACT inhaler Inhale 1 puff into the lungs 2 (two) times daily as needed (asthma).     [provider]  botulinum toxin Type A (BOTOX) 100 units SOLR injection Inject 200 Units into the muscle every 3 (three) months. Inject into head and neck muscles 04/30/19   Marcial Pacas, MD  clonazePAM (KLONOPIN) 1 MG tablet Take 1 mg by mouth 4 (four) times daily.  07/14/19   [provider]  Deutetrabenazine (AUSTEDO) 9 MG TABS Take 18 mg by mouth 2 (two) times daily. 11/26/19   Salley Slaughter, NP  Fremanezumab-vfrm (AJOVY) 225 MG/1.5ML SOAJ Inject 225 mg into the skin every 30 (thirty) days. 12/12/19   Marcial Pacas, MD  gabapentin (NEURONTIN) 300 MG capsule Take 1 capsule (300 mg total) by mouth 3 (three) times daily as needed. Patient taking differently: Take 300 mg by mouth 3 (three) times daily as needed (pain).  11/26/19   Eulis Canner E, NP  hydrOXYzine (ATARAX/VISTARIL) 25 MG tablet TAKE 1 TABLET BY MOUTH 3 TIMES DAILY AS NEEDED. Patient taking differently: Take 25 mg by mouth 3 (three) times daily as needed for anxiety.  01/09/20   Salley Slaughter, NP  ibuprofen (ADVIL) 200 MG tablet Take 800 mg by mouth every 6 (six) hours as needed for fever, headache or moderate pain.    [provider]  Multiple Vitamins-Calcium (ONE-A-DAY WOMENS FORMULA PO) Take 1 tablet by mouth daily.    [provider]  ondansetron (ZOFRAN ODT) 4 MG disintegrating tablet 4mg  ODT q4 hours prn nausea/vomit 01/20/20   Jacqlyn Larsen, PA-C  oxyCODONE-acetaminophen (PERCOCET/ROXICET) 5-325 MG tablet Take 1 tablet by mouth every 4 (four) hours as needed for moderate pain. 01/21/20   Chancy Milroy, MD  SUMAtriptan (IMITREX) 100 MG  tablet Take 1 tablet (100 mg total) by mouth every 2 (two) hours as needed for migraine. MAY DOSE 2 PER DAY OR 3 12/12/19   Marcial Pacas, MD  traZODone (DESYREL) 50 MG tablet TAKE 1 TABLET BY MOUTH AT BEDTIME AS NEEDED FOR SLEEP. Patient taking differently: Take 50 mg by mouth at bedtime as needed for sleep.  12/12/19   Salley Slaughter, NP  Vilazodone HCl (VIIBRYD) 20 MG TABS Take 1 tablet (20 mg total) by mouth daily. 11/26/19   Salley Slaughter, NP  zolpidem (AMBIEN) 10 MG tablet Take 10 mg by mouth at bedtime. 12/31/19   [provider]    Allergies    Keflex [cephalexin] and Flagyl [metronidazole]  Review of Systems   Review of Systems  Respiratory: Positive for shortness of  breath.   Cardiovascular: Positive for chest pain.  Gastrointestinal: Positive for abdominal pain.  All other systems reviewed and are negative.   Physical Exam Updated Vital Signs BP 127/83 (BP Location: Left Arm)   Pulse 71   Temp 98.4 F (36.9 C) (Oral)   Resp 18   Ht 5\' 11"  (1.803 m)   Wt 90.7 kg   LMP  (LMP Unknown)   SpO2 100%   BMI 27.89 kg/m   Physical Exam Vitals and nursing note reviewed.  Constitutional:      Appearance: She is well-developed.  HENT:     Head: Normocephalic and atraumatic.     Nose: No congestion or rhinorrhea.     Mouth/Throat:     Mouth: Mucous membranes are moist.  Eyes:     Pupils: Pupils are equal, round, and reactive to light.  Cardiovascular:     Rate and Rhythm: Normal rate and regular rhythm.  Pulmonary:     Effort: No respiratory distress.     Breath sounds: No stridor.  Abdominal:     General: There is no distension.  Musculoskeletal:        General: Normal range of motion.     Cervical back: Normal range of motion.  Skin:    General: Skin is warm and dry.  Neurological:     General: No focal deficit present.     Mental Status: She is alert.     ED Results / Procedures / Treatments   Labs (all labs ordered are listed, but only  abnormal results are displayed) Labs Reviewed - No data to display  EKG None  Radiology DG Chest 2 View  Result Date: 01/25/2020 CLINICAL DATA:  Chest pain, short of breath, hysterectomy EXAM: CHEST - 2 VIEW COMPARISON:  01/20/2020 FINDINGS: The heart size and mediastinal contours are within normal limits. Both lungs are clear. The visualized skeletal structures are unremarkable. IMPRESSION: No active cardiopulmonary disease. Electronically Signed   By: Randa Ngo M.D.   On: 01/25/2020 20:16    Procedures Procedures (including critical care time)  Medications Ordered in ED Medications  HYDROmorphone (DILAUDID) injection 1 mg (1 mg Intramuscular Given 01/26/20 6063)    ED Course  I have reviewed the triage vital signs and the nursing notes.  Pertinent labs & imaging results that were available during my care of the patient were reviewed by me and considered in my medical decision making (see chart for details).    MDM Rules/Calculators/A&P                          Patient was seen at [facility] earlier tonight and had 2 troponins, EKG, chest x-ray and other basic labs done.  These were reviewed with her I felt this is just of worsening of her postop pain.  I think it is low likelihood that she has a pulmonary embolus, ACS, pneumothorax, pneumonia or any other emergent condition at this time.  I cannot say exactly what it is however based on her exam I do not feel like she needs to be admitted to the hospital or have any other further work-up done at this time.  Long conversation at bedside with her and her mother.  Discussed proper use of opiates, anti-inflammatories, exercise, incentive spirometer to try to help her symptoms.  Also discussed the addictiveness of opioids and the difficulty and managing those.  Discussed with her that there may be something going on that is causing  her pain however she need to follow-up with her primary doctors to further elucidate that.  Felt the  chances of an emergent medical condition are low at this time as her surgery was a couple weeks ago and she has had multiple work-ups since that time.  Final Clinical Impression(s) / ED Diagnoses Final diagnoses:  Abdominal pain, unspecified abdominal location  Chest pain, unspecified type    Rx / DC Orders ED Discharge Orders    None       Laron Angelini, Corene Cornea, MD 01/26/20 2317

## 2020-01-26 NOTE — ED Notes (Signed)
Pt leaving AMA, advised to return if symptoms worsen. 

## 2020-01-26 NOTE — ED Triage Notes (Signed)
Pt states abdominal pain since hysterectomy on 01/15/2020. Chest pain started 01/18/2020.  C/O shortness of breath.

## 2020-01-29 ENCOUNTER — Telehealth (HOSPITAL_COMMUNITY): Payer: Self-pay | Admitting: *Deleted

## 2020-01-29 NOTE — Telephone Encounter (Signed)
Call from patients mom stating she is unable to get [facility] and wanting to know if Ms Ronne Binning NP can prescribe something else. States she got it filled initally with her Medicaid at [facility] but now pharmacy says they dont have it and not sure they will be getting it and [facility] so mom thinks this is something new and is calling for something else. [facility] called [facility], they have it and she has mcd so she can use their pharmacy. Will ask provider to move Rx there and patient also has to have an AIMS scale. Will let provider know.

## 2020-01-30 ENCOUNTER — Encounter: Payer: Self-pay | Admitting: General Practice

## 2020-01-30 ENCOUNTER — Telehealth (HOSPITAL_COMMUNITY): Payer: Self-pay | Admitting: *Deleted

## 2020-01-30 ENCOUNTER — Other Ambulatory Visit (HOSPITAL_COMMUNITY): Payer: Self-pay | Admitting: Psychiatry

## 2020-01-30 ENCOUNTER — Other Ambulatory Visit (HOSPITAL_COMMUNITY): Payer: Self-pay | Admitting: *Deleted

## 2020-01-30 DIAGNOSIS — G2401 Drug induced subacute dyskinesia: Secondary | ICD-10-CM

## 2020-01-30 MED ORDER — AUSTEDO 9 MG PO TABS: 18.0000 mg | ORAL_TABLET | Freq: Two times a day (BID) | ORAL | 2 refills | Status: DC

## 2020-01-30 NOTE — Telephone Encounter (Signed)
Medications refilled and sent to [facility].

## 2020-01-30 NOTE — Telephone Encounter (Signed)
Medications refilled and sent to [facility].

## 2020-01-30 NOTE — Telephone Encounter (Signed)
Called mom as a follow up to her call yesterday re her inability to locate [facility] to fullfill patients Rx. [facility] it, presented the idea to mom and she is good with going there to fill the [facility] Rx. Will inform Ms Ronne Binning NP to call it in to [facility] and mom will be picking it up.

## 2020-01-31 ENCOUNTER — Encounter: Payer: Medicaid Other | Admitting: Obstetrics and Gynecology

## 2020-02-01 ENCOUNTER — Other Ambulatory Visit: Payer: Self-pay

## 2020-02-01 ENCOUNTER — Encounter: Payer: Self-pay | Admitting: Obstetrics and Gynecology

## 2020-02-01 ENCOUNTER — Ambulatory Visit (INDEPENDENT_AMBULATORY_CARE_PROVIDER_SITE_OTHER): Payer: Medicaid Other | Admitting: Obstetrics and Gynecology

## 2020-02-01 VITALS — BP 119/82 | HR 94 | Wt 230.2 lb

## 2020-02-01 DIAGNOSIS — Z9889 Other specified postprocedural states: Secondary | ICD-10-CM

## 2020-02-01 NOTE — Progress Notes (Signed)
Patient ID: Carmen Daniels, female   DOB: Oct 17, 1985, 34 y.o.   MRN: [id number] [name] presents for post op visit. S/P TVH on 01/15/20. Had some problems with pain but this has resolved. She is ambulating, voiding, tolerating diet without problems.  Pathology reviewed with pt.  PE AF VSS Lungs clear  Heart  RRR Abd soft + BS GU deferred  A/P Post op TVH Doing well. Continue with pelvic rest. Increase ADL's as tolerates. F/U in 1 month

## 2020-02-01 NOTE — Progress Notes (Signed)
Pt is in the office for post op appointment, vaginal hysterectomy on 01-15-20. Pt states that pain has improved, she cannot rate it but states that it is on the lower end.

## 2020-02-12 ENCOUNTER — Telehealth: Payer: Self-pay | Admitting: Neurology

## 2020-02-12 DIAGNOSIS — G43719 Chronic migraine without aura, intractable, without status migrainosus: Secondary | ICD-10-CM

## 2020-02-12 NOTE — Telephone Encounter (Signed)
I spoke to Manuela Schwartz (mother on [place]). States Carmen Daniels has noticed an increase in migraines, especially in the last month. #12 sumatriptan tablets is not lasting the month. However, #12 is all [place] Medicaid will cover. Her mother is asking if the prescription can be increased to #15 per 30 days. She is happy to pay out-of-pocket for the 3 additional tablets, if Dr. Krista Blue approves.

## 2020-02-12 NOTE — Telephone Encounter (Signed)
Pt's mother called wanting to discuss the pt's SUMAtriptan (IMITREX) 100 MG tablet with RN. Please advise.

## 2020-02-13 MED ORDER — ONDANSETRON 4 MG PO TBDP: 4.0000 mg | ORAL_TABLET | Freq: Three times a day (TID) | ORAL | 6 refills | Status: DC | PRN

## 2020-02-13 MED ORDER — TIZANIDINE HCL 4 MG PO TABS: 4.0000 mg | ORAL_TABLET | Freq: Four times a day (QID) | ORAL | 6 refills | Status: DC | PRN

## 2020-02-13 NOTE — Telephone Encounter (Signed)
I have tried to contact her mother twice this morning. No answer and mailbox is full. I will try again this afternoon.

## 2020-02-13 NOTE — Telephone Encounter (Signed)
I spoke to the patient's mother. She verbalized understanding of the combination of medication that can be used for migraines.

## 2020-02-13 NOTE — Telephone Encounter (Signed)
Please let her mother know,  frequent Imitrex intake will induce medicine rebound headache, which is more difficult to treat, especially in Carmen Daniels's case, she is already on polypharmacy treatment.  We will keep Imitrex 12 tablet each month.  I have called in Zofran 4 mg as needed, tizanidine 4 mg as needed,  She may combine Imitrex with Aleve, Zofran, tizanidine, even Benadryl and sleep for better control of prolonged headache,

## 2020-02-13 NOTE — Addendum Note (Signed)
Addended by: Marcial Pacas on: 02/13/2020 08:52 AM   Modules accepted: Orders

## 2020-02-26 ENCOUNTER — Encounter (HOSPITAL_COMMUNITY): Payer: Medicaid Other | Admitting: Psychiatry

## 2020-03-04 ENCOUNTER — Encounter: Payer: Medicaid Other | Admitting: Obstetrics and Gynecology

## 2020-03-04 ENCOUNTER — Ambulatory Visit: Payer: Medicaid Other | Admitting: Obstetrics and Gynecology

## 2020-03-07 ENCOUNTER — Other Ambulatory Visit (HOSPITAL_COMMUNITY): Payer: Self-pay | Admitting: Psychiatry

## 2020-03-07 DIAGNOSIS — F3130 Bipolar disorder, current episode depressed, mild or moderate severity, unspecified: Secondary | ICD-10-CM

## 2020-03-11 ENCOUNTER — Encounter (HOSPITAL_COMMUNITY): Payer: Medicaid Other | Admitting: Psychiatry

## 2020-03-17 ENCOUNTER — Telehealth: Payer: Self-pay | Admitting: Neurology

## 2020-03-17 NOTE — Telephone Encounter (Signed)
Patient has a Botox appointment 2/2. I called [facility] and spoke with Tonya to check status of Botox order. She states [facility] has tried to reach the patient for consent unsuccessfully. I ended our call and called the patient. I provided her with the phone number to [facility] and advised her to call to give consent. She states she will do so.

## 2020-03-19 ENCOUNTER — Ambulatory Visit: Payer: Medicaid Other | Admitting: Obstetrics and Gynecology

## 2020-03-24 NOTE — Telephone Encounter (Signed)
Received call from [facility] to schedule Botox delivery. Botox TBD 2/1.

## 2020-03-25 NOTE — Telephone Encounter (Signed)
Received (1) 200U vial of Botox today from [facility].

## 2020-03-26 ENCOUNTER — Ambulatory Visit: Payer: Medicaid Other | Admitting: Neurology

## 2020-03-31 ENCOUNTER — Encounter (HOSPITAL_COMMUNITY): Payer: Self-pay | Admitting: Psychiatry

## 2020-03-31 ENCOUNTER — Ambulatory Visit (INDEPENDENT_AMBULATORY_CARE_PROVIDER_SITE_OTHER): Payer: Medicaid Other | Admitting: Psychiatry

## 2020-03-31 ENCOUNTER — Other Ambulatory Visit: Payer: Self-pay

## 2020-03-31 DIAGNOSIS — F3130 Bipolar disorder, current episode depressed, mild or moderate severity, unspecified: Secondary | ICD-10-CM | POA: Diagnosis not present

## 2020-03-31 MED ORDER — ARIPIPRAZOLE 5 MG PO TABS: 5.0000 mg | ORAL_TABLET | Freq: Every day | ORAL | 2 refills | Status: DC

## 2020-03-31 NOTE — Progress Notes (Signed)
[facility] MD/PA/NP OP Progress Note  03/31/2020 11:51 AM Carmen Daniels  MRN:  [id number]  Chief Complaint: "I went off all my meds to loose wait but I feel awful" Chief Complaint    Medication Management      HPI: 35 year old female seen today for follow-up psychiatric evaluation and medication management. She has a psychiatric history of bipolar disorder, borderline personality disorder, anxiety, depression, and SI/SA. She is currently managed on starting Abilify 5 mg daily, vybrid 20, trazodone 25 mg-50 mg as needed,and hydroxyzine 25 mg three times daily as needed. She notes that she doscontinued her medication because she felt like they were causing her to gain weight.  Today she is well-groomed, pleasant, cooperative, engaged in conversation, and maintained eye contact.  She informed provider that since she discontinued her medications she feels like her mood is unstable.  She endorses increased anxiety and depression.  Provider conducted a GAD-7 and patient scored an 18, provider also conducted a PHQ-9 and patient scored a 19.  Today she denies SI/HI/VAH.  He does endorse paranoia noting that with her car.  Patient endorses anhedonia noting that she stays in the bed most days.  She notes that she is unaware of how many hours a night that she gets to sleep.  Since discontinuing her medications she notes that her appetite is poor and reports that she has lost 10 pounds.    Patient informed provider that she is skeptical about restarting her medications as she believes they cause weight gain.  Provider discussed side effects of each medication and informed patient that the regimen she is currently on generally does not cause increased weight gain.  She endorsed understanding however notes that she would like to start slow with restarting medication.  She is agreeable to restarting Abilify 5 mg daily to help manage anxiety, depression, and psychosis.  She will follow-up with provider in 2 months for further  evaluation.  She will also follow-up with outpatient counseling for therapy.  No other concerns noted at this time.  Visit Diagnosis:    ICD-10-CM   1. Bipolar I disorder, most recent episode depressed (HCC)  F31.30 ARIPiprazole (ABILIFY) 5 MG tablet    Past Psychiatric History: bipolar disorder, borderline personality disorder, anxiety, depression, and SI/SA Past Medical History:  Past Medical History:  Diagnosis Date  . Acute respiratory failure with hypoxia ([place])   . Acute rhinosinusitis 07/03/2018  . Allergic rhinitis   . Anxiety   . Asthma   . Back pain   . Bipolar disorder ([place])   . Borderline personality disorder ([place])   . Bupropion overdose   . Chronic pain   . Chronic pain syndrome   . Depression   . Fibromyalgia    generalized  . Hallucinations   . Migraines   . Nasal polyps   . Nausea   . Neck pain   . OCD (obsessive compulsive disorder)   . OCD (obsessive compulsive disorder)   . Ovarian cyst    left  . Pneumonia    2021  . PONV (postoperative nausea and vomiting)   . Schizophrenia ([place])    schizoaffective    Past Surgical History:  Procedure Laterality Date  . LAPAROSCOPIC TUBAL LIGATION Bilateral 06/13/2019   Procedure: LAPAROSCOPIC TUBAL LIGATION;  Surgeon: Chancy Milroy, MD;  Location: [place] [facility];  Service: Gynecology;  Laterality: Bilateral;  FILSHIE CLIPS  . NASAL SINUS SURGERY    . TUBAL LIGATION    . tubes in ears    .  VAGINAL HYSTERECTOMY Bilateral 01/15/2020   Procedure: HYSTERECTOMY VAGINAL;  Surgeon: Chancy Milroy, MD;  Location: [place];  Service: Gynecology;  Laterality: Bilateral;  . WISDOM TOOTH EXTRACTION      Family Psychiatric History: Paternal grandmother depression Family History:  Family History  Problem Relation Age of Onset  . Healthy Father   . Healthy Mother   . Arthritis Maternal Grandmother   . Arthritis Maternal Grandfather   . Depression Paternal Grandmother     Social History:  Social History    Socioeconomic History  . Marital status: Married    Spouse name: Not on file  . Number of children: 0  . Years of education: 53  . Highest education level: Not on file  Occupational History  . Occupation: Unemployed  Tobacco Use  . Smoking status: Current Every Day Smoker    Packs/day: 0.50    Years: 15.00    Pack years: 7.50    Types: Cigarettes  . Smokeless tobacco: Never Used  Vaping Use  . Vaping Use: Every day  . Substances: Nicotine  Substance and Sexual Activity  . Alcohol use: Never    Alcohol/week: 0.0 standard drinks  . Drug use: No  . Sexual activity: Yes    Birth control/protection: Condom  Other Topics Concern  . Not on file  Social History Narrative   Lives at home alone.   Right-handed.   3 glasses of green tea per day.   Social Determinants of Health   Financial Resource Strain: Not on file  Food Insecurity: Not on file  Transportation Needs: Not on file  Physical Activity: Not on file  Stress: Not on file  Social Connections: Not on file    Allergies:  Allergies  Allergen Reactions  . Keflex [Cephalexin] Anaphylaxis    Metabolic Disorder Labs: No results found for: HGBA1C, MPG No results found for: PROLACTIN Lab Results  Component Value Date   TRIG 341 (H) 04/03/2019   No results found for: TSH  Therapeutic Level Labs: Lab Results  Component Value Date   LITHIUM <0.06 (L) 07/30/2019   LITHIUM <0.06 (L) 03/30/2019   No results found for: VALPROATE No components found for:  CBMZ  Current Medications: Current Outpatient Medications  Medication Sig Dispense Refill  . ARIPiprazole (ABILIFY) 5 MG tablet Take 1 tablet (5 mg total) by mouth daily. 30 tablet 2  . beclomethasone (QVAR) 40 MCG/ACT inhaler Inhale 1 puff into the lungs 2 (two) times daily as needed (asthma).     . botulinum toxin Type A (BOTOX) 100 units SOLR injection Inject 200 Units into the muscle every 3 (three) months. Inject into head and neck muscles 2 each 3  .  clonazePAM (KLONOPIN) 1 MG tablet Take 1 mg by mouth 4 (four) times daily.    . Deutetrabenazine (AUSTEDO) 9 MG TABS Take 18 mg by mouth 2 (two) times daily. 120 tablet 2  . Fremanezumab-vfrm (AJOVY) 225 MG/1.5ML SOAJ Inject 225 mg into the skin every 30 (thirty) days. 1.5 mL 11  . gabapentin (NEURONTIN) 300 MG capsule Take 1 capsule (300 mg total) by mouth 3 (three) times daily as needed. (Patient taking differently: Take 300 mg by mouth 3 (three) times daily as needed (pain).) 120 capsule 2  . hydrOXYzine (ATARAX/VISTARIL) 25 MG tablet TAKE 1 TABLET BY MOUTH 3 TIMES DAILY AS NEEDED. (Patient taking differently: Take 25 mg by mouth 3 (three) times daily as needed for anxiety.) 90 tablet 1  . ibuprofen (ADVIL) 200 MG tablet Take  800 mg by mouth every 6 (six) hours as needed for fever, headache or moderate pain.    . Multiple Vitamins-Calcium (ONE-A-DAY WOMENS FORMULA PO) Take 1 tablet by mouth daily.    . ondansetron (ZOFRAN ODT) 4 MG disintegrating tablet 4mg  ODT q4 hours prn nausea/vomit 10 tablet 0  . ondansetron (ZOFRAN ODT) 4 MG disintegrating tablet Take 1 tablet (4 mg total) by mouth every 8 (eight) hours as needed. 20 tablet 6  . oxyCODONE-acetaminophen (PERCOCET/ROXICET) 5-325 MG tablet Take 1 tablet by mouth every 4 (four) hours as needed for moderate pain. 30 tablet 0  . SUMAtriptan (IMITREX) 100 MG tablet Take 1 tablet (100 mg total) by mouth every 2 (two) hours as needed for migraine. MAY DOSE 2 PER DAY OR 3 12 tablet 11  . tiZANidine (ZANAFLEX) 4 MG tablet Take 1 tablet (4 mg total) by mouth every 6 (six) hours as needed. 30 tablet 6  . traZODone (DESYREL) 50 MG tablet TAKE 1 TABLET BY MOUTH AT BEDTIME AS NEEDED FOR SLEEP. 30 tablet 2  . VIIBRYD 20 MG TABS TAKE 1 TABLET (20 MG TOTAL) BY MOUTH DAILY. 30 tablet 2  . zolpidem (AMBIEN) 10 MG tablet Take 10 mg by mouth at bedtime.     No current facility-administered medications for this visit.     Musculoskeletal: Strength & Muscle  Tone: within normal limits Gait & Station: normal Patient leans: N/A  Psychiatric Specialty Exam: Review of Systems  Blood pressure 123/85, pulse 90, height 5\' 11"  (1.803 m), weight 220 lb (99.8 kg), SpO2 100 %.Body mass index is 30.68 kg/m.  General Appearance: Well Groomed  Eye Contact:  Good  Speech:  Clear and Coherent and Normal Rate  Volume:  Normal  Mood:  Anxious and Depressed  Affect:  Congruent  Thought Process:  Coherent, Goal Directed and Linear  Orientation:  Full (Time, Place, and Person)  Thought Content: WDL and Logical   Suicidal Thoughts:  No  Homicidal Thoughts:  No  Memory:  Immediate;   Good Recent;   Good Remote;   Good  Judgement:  Good  Insight:  Good  Psychomotor Activity:  Normal  Concentration:  Concentration: Good and Attention Span: Good  Recall:  Good  Fund of Knowledge: Good  Language: Good  Akathisia:  No  Handed:  Right  AIMS (if indicated):Done  Assets:  Communication Skills Desire for Improvement Financial Resources/Insurance Housing Social Support  ADL's:  Intact  Cognition: WNL  Sleep:  Good   Screenings: AIMS   Flowsheet Row Clinical Support from 11/26/2019 in [facility] Admission (Discharged) from 04/10/2017 in [facility]  AIMS Total Score 5 0    GAD-7   Flowsheet Row Clinical Support from 03/31/2020 in [facility]  Total GAD-7 Score [facility] Visit from 10/05/2016 in [facility]  Total Score (max 30 points ) 26    PHQ2-9   Flowsheet Row Clinical Support from 03/31/2020 in [facility] ED from 09/06/2019 in [facility]  PHQ-2 Total Score 6 1  PHQ-9 Total Score 19 --    [place] ED from 07/30/2019 in [facility] DEPT  C-SSRS RISK CATEGORY High Risk       Assessment and Plan: Patient reports she  discontinued her medications and is now experiencing anxiety, depression, and paranoia. She notes that she does not want to restart  all of her medications. She is agree to restarting Abilify 5 mg daily to help manage anxiety, depression, and paranoia. Patient agreeable to restarting her other medications at her next visit.    1. Bipolar I disorder, most recent episode depressed (HCC)   Continue- ARIPiprazole (ABILIFY) 5 MG tablet; Take 1 tablet (5 mg total) by mouth daily.  Dispense: 30 tablet; Refill: 2 Continue- gabapentin (NEURONTIN) 300 MG capsule; Take 1 capsule (300 mg total) by mouth 3 (three) times daily as needed.  Dispense: 120 capsule; Refill: 2     Follow-up in 2 months. Follow-up with outpatient therapy   Salley Slaughter, NP 03/31/2020, 11:51 AM

## 2020-03-31 NOTE — Telephone Encounter (Signed)
Patient missed her 2/2 appointment. I called the patient to offer tomorrow at 1:30, but patient did not answer and her VM is full. I then called patient's mother, Carmen Daniels, and she states she can bring patient for tomorrow's appointment.

## 2020-04-01 ENCOUNTER — Ambulatory Visit: Payer: Medicaid Other | Admitting: Neurology

## 2020-04-01 ENCOUNTER — Other Ambulatory Visit: Payer: Self-pay

## 2020-04-01 ENCOUNTER — Encounter: Payer: Self-pay | Admitting: Neurology

## 2020-04-01 VITALS — Ht 71.0 in

## 2020-04-01 DIAGNOSIS — G43719 Chronic migraine without aura, intractable, without status migrainosus: Secondary | ICD-10-CM | POA: Diagnosis not present

## 2020-04-01 NOTE — Progress Notes (Unsigned)
**  Botox 200 units x 1 vial, [place] 9692-4932-41, Lot [id number], Exp 11/2022, specialty pharmacy.//mck,rn**

## 2020-04-01 NOTE — Progress Notes (Signed)
PATIENT: Carmen Daniels DOB: 1985-06-18  Chief Complaint  Patient presents with  . Procedure    Migraines - Botox     HISTORICAL  Carmen Daniels has of being a patient of our office for many years for chronic migraine headaches, she also has history of bipolar disorder, today she came in with her mother to follow-up her hospital discharge in February 2021 for polypharmacy overdose, status epilepticus  She was admitted to the hospital on March 28, 2019 with intentional overdose, this including Wellbutrin, Ambien, Flexeril, then she was noted to have intermittent whole body shaking, progressed to have seizure-like activity, was intubated on March 29, 2019, was loaded with Versed, Dilantin, she was noted to have 10 to 15 seconds of seizure activity followed by quiescence.  Then she was loaded with IV Keppra, midazolam, followed by propofol drip, which stopped the clinical seizure activity, later continuous EEG showed diffuse continuous slowing, system with severe encephalopathy, she was seen by epileptologist Dr. Melynda Ripple on April 05, 2019, her seizure were likely provoked in the setting of Wellbutrin overdose, most likely she will not require long-term antiemetic medications, continue seizure precaution, gradually wean off Keppra,  Her hospital course was also complicated by aspiration pneumonia, right hand cellulitis, was treated with Levaquin, Flagyl.  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,  Continuous EEG monitoring showed severe diffuse encephalopathy, continuous generalized slowing,  Laboratory evaluation on March 28, 2019 showed positive for benzodiazepine, lithium level was 0.11, plasma lactic acid was 4.1,  Most recent laboratory evaluation in February 2021, CBC showed mild anemia hemoglobin of 11.3,  BMP showed no significant abnormalities  She now complains of short-term memory loss, had no recurrent seizure-like activity, has moved in with her parents, also complains of increased migraine headaches,  She began to receive Botox injection as migraine prevention since March 2017, most recent injection was in December 2020  She previously on polypharmacy for her mood disorder, now only on Zyprexa 50 mg at bedtime  UPDATE June 08 2019: Her EEG was normal on May 31, 2019.  She does not have recurrent seizure, she did have status epilepticus during her hospital admission on March 28, 2019 due to polypharmacy overdose, she is not taking Keppra 750 mg twice a day, I have suggested her for total treatment of 6 months, stepping down to Keppra 500 mg for 2 months, then 250 mg for 2 months, then stop,  She complains of frequent headaches, used up all her monthly Imitrex supply, came in for Botox injection today  UPDATE September 05 2019: She responded well to previous injection, noticed increased headache at the end of the injection cycle  UPDATE 04/01/2020 Her headache has been under good control over the past few months,  REVIEW OF SYSTEMS: Full 14 system review of systems performed and notable only for as above All other review of systems were negative.  ALLERGIES: Allergies  Allergen Reactions  . Keflex [Cephalexin] Anaphylaxis    HOME MEDICATIONS: Current Outpatient Medications  Medication Sig Dispense Refill  . ARIPiprazole (ABILIFY) 5 MG tablet Take 1 tablet (5 mg total) by mouth daily. 30 tablet 2  . beclomethasone (QVAR) 40 MCG/ACT inhaler Inhale 1 puff into the lungs 2 (two) times daily as needed (asthma).     . botulinum toxin Type A (BOTOX)  100 units SOLR injection Inject 200 Units into the muscle every 3 (three) months. Inject into head and neck muscles 2 each 3  . clonazePAM (KLONOPIN) 1 MG tablet Take 1 mg by mouth 4 (four) times daily.    . Fremanezumab-vfrm (AJOVY) 225  MG/1.5ML SOAJ Inject 225 mg into the skin every 30 (thirty) days. 1.5 mL 11  . Multiple Vitamins-Calcium (ONE-A-DAY WOMENS FORMULA PO) Take 1 tablet by mouth daily.    . SUMAtriptan (IMITREX) 100 MG tablet Take 1 tablet (100 mg total) by mouth every 2 (two) hours as needed for migraine. MAY DOSE 2 PER DAY OR 3 12 tablet 11  . zolpidem (AMBIEN) 10 MG tablet Take 10 mg by mouth at bedtime.     No current facility-administered medications for this visit.    PAST MEDICAL HISTORY: Past Medical History:  Diagnosis Date  . Acute respiratory failure with hypoxia (HCC)   . Acute rhinosinusitis 07/03/2018  . Allergic rhinitis   . Anxiety   . Asthma   . Back pain   . Bipolar disorder (HCC)   . Borderline personality disorder (HCC)   . Bupropion overdose   . Chronic pain   . Chronic pain syndrome   . Depression   . Fibromyalgia    generalized  . Hallucinations   . Migraines   . Nasal polyps   . Nausea   . Neck pain   . OCD (obsessive compulsive disorder)   . OCD (obsessive compulsive disorder)   . Ovarian cyst    left  . Pneumonia    2021  . PONV (postoperative nausea and vomiting)   . Schizophrenia (HCC)    schizoaffective    PAST SURGICAL HISTORY: Past Surgical History:  Procedure Laterality Date  . LAPAROSCOPIC TUBAL LIGATION Bilateral 06/13/2019   Procedure: LAPAROSCOPIC TUBAL LIGATION;  Surgeon: Hermina Staggers, MD;  Location: [facility];  Service: Gynecology;  Laterality: Bilateral;  FILSHIE CLIPS  . NASAL SINUS SURGERY    . TUBAL LIGATION    . tubes in ears    . VAGINAL HYSTERECTOMY Bilateral 01/15/2020   Procedure: HYSTERECTOMY VAGINAL;  Surgeon: Hermina Staggers, MD;  Location: [facility];  Service: Gynecology;  Laterality: Bilateral;  . WISDOM TOOTH EXTRACTION      FAMILY HISTORY: Family History  Problem Relation Age of Onset  . Healthy Father   . Healthy Mother   . Arthritis Maternal Grandmother   . Arthritis Maternal Grandfather   . Depression  Paternal Grandmother     SOCIAL HISTORY: Social History   Socioeconomic History  . Marital status: Married    Spouse name: Not on file  . Number of children: 0  . Years of education: 83  . Highest education level: Not on file  Occupational History  . Occupation: Unemployed  Tobacco Use  . Smoking status: Current Every Day Smoker    Packs/day: 0.50    Years: 15.00    Pack years: 7.50    Types: Cigarettes  . Smokeless tobacco: Never Used  Vaping Use  . Vaping Use: Every day  . Substances: Nicotine  Substance and Sexual Activity  . Alcohol use: Never    Alcohol/week: 0.0 standard drinks  . Drug use: No  . Sexual activity: Yes    Birth control/protection: Condom  Other Topics Concern  . Not on file  Social History Narrative   Lives at home alone.   Right-handed.   3 glasses of green tea per day.   Social Determinants  of Health   Financial Resource Strain: Not on file  Food Insecurity: Not on file  Transportation Needs: Not on file  Physical Activity: Not on file  Stress: Not on file  Social Connections: Not on file  Intimate Partner Violence: Not on file     PHYSICAL EXAM   Vitals:   04/01/20 1311  Height: 5\' 11"  (1.803 m)   Not recorded     Body mass index is 30.68 kg/m.  PHYSICAL EXAMNIATION:  Depressed looking young female, alert oriented, to history taking and care of conversation,   DIAGNOSTIC DATA (LABS, IMAGING, TESTING) - I reviewed patient records, labs, notes, testing and imaging myself where available.   ASSESSMENT AND PLAN  Carmen Daniels is a 35 y.o. 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  Call clinic for recurrent event  Chronic migraine headaches  Imitrex 100 mg as needed, limit the use to less than 3 times each week, may combine with NSAIDs, Zofran  Botox injection for chronic migraine prevention, injection was performed according to [place] protocol,  5 units of Botox was injected into each side, for 31 injection sites, total of 155 units  Bilateral frontalis 4 injection sites Bilateral corrugate 2 injection sites Procerus 1 injection sites. Bilateral temporalis 8 injection sites Bilateral occipitalis 6 injection sites Bilateral cervical paraspinals 4 injection sites Bilateral upper trapezius 6 injection sites  Extra 45 unites were injected into bilateral upper cervical paraspinals, masseters  Levert Feinstein, M.D. Ph.D.  [facility] [address], [address] [place], [place] [address] Ph: [phone] Fax: [phone]  CC: Referring Provider

## 2020-04-09 ENCOUNTER — Other Ambulatory Visit: Payer: Self-pay

## 2020-04-09 ENCOUNTER — Ambulatory Visit (INDEPENDENT_AMBULATORY_CARE_PROVIDER_SITE_OTHER): Payer: Medicaid Other | Admitting: Licensed Clinical Social Worker

## 2020-04-09 DIAGNOSIS — F312 Bipolar disorder, current episode manic severe with psychotic features: Secondary | ICD-10-CM | POA: Diagnosis not present

## 2020-04-09 DIAGNOSIS — F603 Borderline personality disorder: Secondary | ICD-10-CM

## 2020-04-09 DIAGNOSIS — F4010 Social phobia, unspecified: Secondary | ICD-10-CM

## 2020-04-09 NOTE — Progress Notes (Signed)
   THERAPIST PROGRESS NOTE  Session Time: 44  Therapist Response:    Subjective/Objective:  Pt was alert and oriented x 5. She was dressed casually, disheveled, and groomed. Pt came in with sweatpants and flip flops in winter. She presented with anxious and flat mood/affect.   Pt and LCSW started out session by talking about her goals and objectives of treatment. Carmen Daniels states that her mother wants her to come in for therapy. Carmen Daniels has Hx at [facility] with another LCSW, but she reports that the therapy style did not fit her needs. Carmen Daniels states that she did not really want to be here. LCSW spoke to pt about barriers to Tx and what would make her feel more comfortable moving forward, as she drove here by herself without guidance from someone else. She asked LCSW about what got him into therapy, and other professional questions.   After questions asked to LCSW pt start to open about her personal life. She has 2 sisters and a father that are all doctors. The only people that are not doctors in her immediate family are pt and her mother. Carmen Daniels reports her income comes from disability for back and neck problems. Pain and nutrition assessment was completed. No intervention needed for nutritional. For pain pt is seeing Chiropractor for neck and back adjustments.  Carmen Daniels states that she has a Hx for mental health for BPD, MDD, GAD, PTSD, and bipolar disorder. She also has Hx of 1 suicide attempt about 1 year ago where she took all her prescription medications. Her father found her on the floor of the guest house. Pt was in a coma for several days and had to rehab to learn how to walk again. Carmen Daniels reports minor brain damage from the incident causing her to be forgetful on dates/times. Carmen Daniels reports that she has an ex-spouse that she currently still talks to as a friend. She states that he is a Occupational psychologist, but he has indicated to her before that he wants to get back together. Carmen Daniels states this is not  what she wants as he has cheated and currently has a newborn with another girl.    Assessment: Pt endorses symptoms for depression and anxiety for tension, worry, irritability, mood swings, worthlessness, hopelessness, flat/mood/affect. Currently pt does meet criteria for bipolar 1 disorder. She is taking her medications as prescribed and is agreeable to see LCSW 2 x monthly.    Plan: Be able to work through the story of her pet's death from start to finish while experiencing emotion in session, Pt to walk 2 x weekly, Pt to write a letter to her dog about what she wishes she could have said before he died, Pt to write down 5 positive things about self-weekly, Pt to attempt to journal 1 x weekly.  Participation Level: Active  Behavioral Response: Casual, Disheveled and Fairly GroomedAlertAnxious and flat   Type of Therapy: Individual Therapy  Treatment Goals addressed: Diagnosis: bipolar 1 depressed  Interventions: CBT and Supportive  Summary: Carmen Daniels is a 35 y.o. female who presents with Bipolar 1 disorder .   Suicidal/Homicidal: Nowithout intent/plan    Plan: Return again in 4 weeks.     Dory Horn, LCSW 04/09/2020

## 2020-04-10 ENCOUNTER — Other Ambulatory Visit (HOSPITAL_COMMUNITY): Payer: Self-pay | Admitting: Psychiatry

## 2020-04-10 DIAGNOSIS — G2401 Drug induced subacute dyskinesia: Secondary | ICD-10-CM

## 2020-04-15 ENCOUNTER — Telehealth: Payer: Self-pay | Admitting: Neurology

## 2020-04-15 NOTE — Telephone Encounter (Signed)
Patient's next Botox appointment is 5/18. Her PA with Medicaid will expire 05/16/20. I filled out new PA form and faxed to Medicaid with notes.

## 2020-04-16 NOTE — Telephone Encounter (Signed)
Received approval. PA #[id number] (04/15/20- 04/10/21).

## 2020-05-16 ENCOUNTER — Ambulatory Visit (HOSPITAL_COMMUNITY): Payer: Medicaid Other | Admitting: Licensed Clinical Social Worker

## 2020-05-20 ENCOUNTER — Telehealth: Payer: Self-pay | Admitting: *Deleted

## 2020-05-20 NOTE — Telephone Encounter (Signed)
PA for Ajovy 225mg  started through [facility]. [place] Medicaid ID: [id number] R. Decision pending.

## 2020-05-21 NOTE — Telephone Encounter (Signed)
[facility]#[id number] approved through 05/15/2021.

## 2020-05-28 ENCOUNTER — Other Ambulatory Visit: Payer: Self-pay

## 2020-05-28 ENCOUNTER — Emergency Department (HOSPITAL_BASED_OUTPATIENT_CLINIC_OR_DEPARTMENT_OTHER)
Admission: EM | Admit: 2020-05-28 | Discharge: 2020-05-28 | Disposition: A | Discharge status: Home / Self Care | Payer: Medicaid Other | Attending: Emergency Medicine | Admitting: Emergency Medicine

## 2020-05-28 ENCOUNTER — Encounter (HOSPITAL_BASED_OUTPATIENT_CLINIC_OR_DEPARTMENT_OTHER): Payer: Self-pay

## 2020-05-28 DIAGNOSIS — F17210 Nicotine dependence, cigarettes, uncomplicated: Secondary | ICD-10-CM | POA: Diagnosis not present

## 2020-05-28 DIAGNOSIS — K0889 Other specified disorders of teeth and supporting structures: Secondary | ICD-10-CM

## 2020-05-28 DIAGNOSIS — J45909 Unspecified asthma, uncomplicated: Secondary | ICD-10-CM | POA: Diagnosis not present

## 2020-05-28 MED ORDER — KETOROLAC TROMETHAMINE 30 MG/ML IJ SOLN
30.0000 mg | Freq: Once | INTRAMUSCULAR | Status: AC
  Administered 2020-05-28: 30 mg via INTRAMUSCULAR
  Filled 2020-05-28: qty 1

## 2020-05-28 NOTE — Discharge Instructions (Addendum)
You were given Toradol while here in the ER for your pain.  Please call your PCP or dentist tomorrow for further pain medication.  You may take over-the-counter ibuprofen or Tylenol as needed for pain.  Return to the ER for any worsening symptoms.

## 2020-05-28 NOTE — ED Provider Notes (Signed)
[facility] EMERGENCY DEPARTMENT Provider Note   CSN: [id number] Arrival date & time: 05/28/20  2031     History Chief Complaint  Patient presents with  . Dental Pain    Carmen Daniels is a 35 y.o. female with a past medical history as noted below who presents to the ED due to left-sided dental pain.  Patient states she had a dental procedure done earlier today where her gums were scraped.  She had a similar procedure last week on the right side and was given oxycodone by her PCP with resolution in pain.  Patient has been taken Tylenol with no relief.  Denies changes to phonation, difficulties swallowing, shortness of breath, and trismus.  No fever or chills.  History obtained from patient and past medical records. No interpreter used during encounter.      Past Medical History:  Diagnosis Date  . Acute respiratory failure with hypoxia ([place])   . Acute rhinosinusitis 07/03/2018  . Allergic rhinitis   . Anxiety   . Asthma   . Back pain   . Bipolar disorder ([place])   . Borderline personality disorder ([place])   . Bupropion overdose   . Chronic pain   . Chronic pain syndrome   . Depression   . Fibromyalgia    generalized  . Hallucinations   . Migraines   . Nasal polyps   . Nausea   . Neck pain   . OCD (obsessive compulsive disorder)   . OCD (obsessive compulsive disorder)   . Ovarian cyst    left  . Pneumonia    2021  . PONV (postoperative nausea and vomiting)   . Schizophrenia ([place])    schizoaffective    Patient Active Problem List   Diagnosis Date Noted  . Tardive dyskinesia 11/14/2019  . Bipolar I disorder, most recent episode depressed ([place]) 09/13/2019  . Social anxiety disorder 09/13/2019  . Adjustment disorder with mixed anxiety and depressed mood 07/31/2019  . Suicidal ideation   . Post-operative state 07/19/2019  . Seizures ([place]) 06/08/2019  . Bipolar 1 disorder ([place]) 05/17/2019  . Status epilepticus ([place]) 05/17/2019  . Intentional benzodiazepine  overdose ([place]) 03/29/2019  . Intractable chronic 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 status migrainosus 02/08/2019  . History of elective abortion 12/28/2018  . QT prolongation 07/03/2018  . AMS (altered mental status) 07/03/2018  . Serum total bilirubin elevated 07/03/2018  . Syncope 07/02/2018  . Borderline personality disorder ([place]) 04/11/2017  . Bipolar I disorder, current or most recent episode manic, with psychotic features ([place]) 04/10/2017  . Bipolar disorder ([place]) 04/10/2017  . Abnormal MRI of head 04/21/2016  . Chronic migraine without aura 03/27/2015  . Mild persistent asthma 12/30/2014  . Allergic rhinitis due to pollen 12/30/2014  . 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 Nasal polyposis 12/30/2014    Past Surgical History:  Procedure Laterality Date  . ABDOMINAL HYSTERECTOMY    . LAPAROSCOPIC TUBAL LIGATION Bilateral 06/13/2019   Procedure: LAPAROSCOPIC TUBAL LIGATION;  Surgeon: Chancy Milroy, MD;  Location: [facility];  Service: Gynecology;  Laterality: Bilateral;  FILSHIE CLIPS  . NASAL SINUS SURGERY    . TUBAL LIGATION    . tubes in ears    . VAGINAL HYSTERECTOMY Bilateral 01/15/2020   Procedure: HYSTERECTOMY VAGINAL;  Surgeon: Chancy Milroy, MD;  Location: [facility];  Service: Gynecology;  Laterality: Bilateral;  . WISDOM TOOTH EXTRACTION       OB History   No obstetric history on file.  Family History  Problem Relation Age of Onset  . Healthy Father   . Healthy Mother   . Arthritis Maternal Grandmother   . Arthritis Maternal Grandfather   . Depression Paternal Grandmother     Social History   Tobacco Use  . Smoking status: Current Every Day Smoker    Packs/day: 0.50    Years: 15.00    Pack years: 7.50    Types: Cigarettes  . Smokeless tobacco: Never Used  Vaping Use  . Vaping Use: Every day  . Substances: Nicotine  Substance Use Topics  . Alcohol use: Never  . Drug use: No    Home Medications Prior to Admission medications    Medication Sig Start Date End Date Taking? Authorizing Provider  ARIPiprazole (ABILIFY) 5 MG tablet Take 1 tablet (5 mg total) by mouth daily. 03/31/20   Salley Slaughter, NP  AUSTEDO 9 MG TABS TAKE 2 TABLETS BY MOUTH TWICE A DAY 04/14/20   Eulis Canner E, NP  beclomethasone (QVAR) 40 MCG/ACT inhaler Inhale 1 puff into the lungs 2 (two) times daily as needed (asthma).     [provider]  botulinum toxin Type A (BOTOX) 100 units SOLR injection Inject 200 Units into the muscle every 3 (three) months. Inject into head and neck muscles 04/30/19   Marcial Pacas, MD  clonazePAM (KLONOPIN) 1 MG tablet Take 1 mg by mouth 4 (four) times daily. 07/14/19   [provider]  Fremanezumab-vfrm (AJOVY) 225 MG/1.5ML SOAJ Inject 225 mg into the skin every 30 (thirty) days. 12/12/19   Marcial Pacas, MD  Multiple Vitamins-Calcium (ONE-A-DAY WOMENS FORMULA PO) Take 1 tablet by mouth daily.    [provider]  SUMAtriptan (IMITREX) 100 MG tablet Take 1 tablet (100 mg total) by mouth every 2 (two) hours as needed for migraine. MAY DOSE 2 PER DAY OR 3 12/12/19   Marcial Pacas, MD  zolpidem (AMBIEN) 10 MG tablet Take 10 mg by mouth at bedtime. 12/31/19   [provider]    Allergies    Keflex [cephalexin]  Review of Systems   Review of Systems  Constitutional: Negative for chills and fever.  HENT: Positive for dental problem. Negative for trouble swallowing.     Physical Exam Updated Vital Signs BP 122/72 (BP Location: Left Arm)   Pulse 76   Temp 98.5 F (36.9 C) (Oral)   Resp 20   LMP  (LMP Unknown)   SpO2 100%   Physical Exam Vitals and nursing note reviewed.  Constitutional:      General: She is not in acute distress.    Appearance: She is not ill-appearing.  HENT:     Head: Normocephalic.     Mouth/Throat:     Comments: Good dentition throughout.  Normal phonation.  No trismus.  Patient tolerating oral secretions without difficulty.  Tongue in normal position  without protrusion.  No tenderness below tongue.  No abscess. No cheek edema.  Eyes:     Pupils: Pupils are equal, round, and reactive to light.  Cardiovascular:     Rate and Rhythm: Normal rate and regular rhythm.     Pulses: Normal pulses.     Heart sounds: Normal heart sounds. No murmur heard. No friction rub. No gallop.   Pulmonary:     Effort: Pulmonary effort is normal.     Breath sounds: Normal breath sounds.  Abdominal:     General: Abdomen is flat. There is no distension.     Palpations: Abdomen is soft.  Tenderness: There is no abdominal tenderness. There is no guarding or rebound.  Musculoskeletal:        General: Normal range of motion.     Cervical back: Neck supple.  Skin:    General: Skin is warm and dry.  Neurological:     General: No focal deficit present.     Mental Status: She is alert.  Psychiatric:        Mood and Affect: Mood normal.        Behavior: Behavior normal.     ED Results / Procedures / Treatments   Labs (all labs ordered are listed, but only abnormal results are displayed) Labs Reviewed - No data to display  EKG None  Radiology No results found.  Procedures Procedures   Medications Ordered in ED Medications  ketorolac (TORADOL) 30 MG/ML injection 30 mg (has no administration in time range)    ED Course  I have reviewed the triage vital signs and the nursing notes.  Pertinent labs & imaging results that were available during my care of the patient were reviewed by me and considered in my medical decision making (see chart for details).    MDM Rules/Calculators/A&P                         35 year old female presents to the ED due to left-sided dental pain after a procedure that was performed earlier today.  History of same last week.  Patient requesting oxycodone or Dilaudid for pain. Upon arrival, vitals all within normal limits.  Patient in no acute distress and nontoxic-appearing.  Physical exam reassuring.  Good dentition  throughout.  No abscess.  Normal phonation.  No trismus.  Patient tolerating oral secretions without difficulty. Low suspicion for Ludwig's or deep space infection. Toradol given. Advised patient to follow-up with dentist for further pain medication. Strict ED precautions discussed with patient. Patient states understanding and agrees to plan. Patient discharged home in no acute distress and stable vitals.  Final Clinical Impression(s) / ED Diagnoses Final diagnoses:  Pain, dental    Rx / DC Orders ED Discharge Orders    None       Karie Kirks 05/28/20 2209    Gareth Morgan, MD 05/29/20 1216

## 2020-05-28 NOTE — ED Notes (Signed)
Patient states descaling of left upper and lower teeth earlier today. Patient states 10/10 pain at this time.

## 2020-05-28 NOTE — ED Provider Notes (Signed)
MSE was initiated and I personally evaluated the patient and placed orders (if any) at  9:13 PM on May 28, 2020.  The patient appears stable so that the remainder of the MSE may be completed by another provider.   Today "de-scaling" of the gum for peridontal disease and large cavity. Went to PCP and given oxycodone last week which helped with pain. Left sided pain from procedure. Tylenol for pain.     Karie Kirks 05/28/20 2114    Gareth Morgan, MD 05/29/20 1216

## 2020-05-28 NOTE — ED Triage Notes (Signed)
Pt c/o pain after dental procedure today-NAD-steady gait

## 2020-05-29 ENCOUNTER — Encounter (HOSPITAL_COMMUNITY): Payer: Medicaid Other | Admitting: Psychiatry

## 2020-05-30 ENCOUNTER — Ambulatory Visit (HOSPITAL_COMMUNITY): Payer: Self-pay | Admitting: Licensed Clinical Social Worker

## 2020-06-18 NOTE — Telephone Encounter (Signed)
I called [facility] and spoke with Judson Roch to schedule Botox delivery for 5/3.

## 2020-06-19 ENCOUNTER — Other Ambulatory Visit (HOSPITAL_COMMUNITY): Payer: Self-pay | Admitting: Psychiatry

## 2020-06-19 DIAGNOSIS — F3130 Bipolar disorder, current episode depressed, mild or moderate severity, unspecified: Secondary | ICD-10-CM

## 2020-06-24 NOTE — Telephone Encounter (Signed)
Received (1) 200 unit vial of Botox today from [employer] [facility].

## 2020-06-29 ENCOUNTER — Other Ambulatory Visit: Payer: Self-pay

## 2020-06-29 ENCOUNTER — Encounter (HOSPITAL_COMMUNITY): Payer: Self-pay | Admitting: Emergency Medicine

## 2020-06-29 ENCOUNTER — Inpatient Hospital Stay (HOSPITAL_COMMUNITY)
Admission: EM | Admit: 2020-06-29 | Discharge: 2020-07-11 | DRG: 683 | Disposition: A | Discharge status: Home Health | Payer: Medicaid Other | Attending: Infectious Diseases | Admitting: Infectious Diseases

## 2020-06-29 ENCOUNTER — Emergency Department (HOSPITAL_COMMUNITY): Payer: Medicaid Other

## 2020-06-29 DIAGNOSIS — Z888 Allergy status to other drugs, medicaments and biological substances status: Secondary | ICD-10-CM

## 2020-06-29 DIAGNOSIS — G43909 Migraine, unspecified, not intractable, without status migrainosus: Secondary | ICD-10-CM | POA: Diagnosis present

## 2020-06-29 DIAGNOSIS — F429 Obsessive-compulsive disorder, unspecified: Secondary | ICD-10-CM | POA: Diagnosis present

## 2020-06-29 DIAGNOSIS — M79604 Pain in right leg: Secondary | ICD-10-CM | POA: Diagnosis present

## 2020-06-29 DIAGNOSIS — G894 Chronic pain syndrome: Secondary | ICD-10-CM | POA: Diagnosis present

## 2020-06-29 DIAGNOSIS — M797 Fibromyalgia: Secondary | ICD-10-CM | POA: Diagnosis present

## 2020-06-29 DIAGNOSIS — E860 Dehydration: Secondary | ICD-10-CM | POA: Diagnosis present

## 2020-06-29 DIAGNOSIS — Z6831 Body mass index (BMI) 31.0-31.9, adult: Secondary | ICD-10-CM

## 2020-06-29 DIAGNOSIS — Z20822 Contact with and (suspected) exposure to covid-19: Secondary | ICD-10-CM | POA: Diagnosis present

## 2020-06-29 DIAGNOSIS — F17210 Nicotine dependence, cigarettes, uncomplicated: Secondary | ICD-10-CM | POA: Diagnosis present

## 2020-06-29 DIAGNOSIS — K5900 Constipation, unspecified: Secondary | ICD-10-CM | POA: Diagnosis not present

## 2020-06-29 DIAGNOSIS — F603 Borderline personality disorder: Secondary | ICD-10-CM | POA: Diagnosis present

## 2020-06-29 DIAGNOSIS — M21371 Foot drop, right foot: Secondary | ICD-10-CM | POA: Diagnosis present

## 2020-06-29 DIAGNOSIS — Z818 Family history of other mental and behavioral disorders: Secondary | ICD-10-CM

## 2020-06-29 DIAGNOSIS — G629 Polyneuropathy, unspecified: Secondary | ICD-10-CM | POA: Diagnosis present

## 2020-06-29 DIAGNOSIS — D649 Anemia, unspecified: Secondary | ICD-10-CM | POA: Diagnosis present

## 2020-06-29 DIAGNOSIS — E440 Moderate protein-calorie malnutrition: Secondary | ICD-10-CM | POA: Insufficient documentation

## 2020-06-29 DIAGNOSIS — M79605 Pain in left leg: Secondary | ICD-10-CM

## 2020-06-29 DIAGNOSIS — J45909 Unspecified asthma, uncomplicated: Secondary | ICD-10-CM | POA: Diagnosis present

## 2020-06-29 DIAGNOSIS — N179 Acute kidney failure, unspecified: Secondary | ICD-10-CM

## 2020-06-29 DIAGNOSIS — M6282 Rhabdomyolysis: Secondary | ICD-10-CM

## 2020-06-29 DIAGNOSIS — M21372 Foot drop, left foot: Secondary | ICD-10-CM | POA: Diagnosis present

## 2020-06-29 DIAGNOSIS — I10 Essential (primary) hypertension: Secondary | ICD-10-CM | POA: Diagnosis present

## 2020-06-29 DIAGNOSIS — Z90710 Acquired absence of both cervix and uterus: Secondary | ICD-10-CM

## 2020-06-29 DIAGNOSIS — Z9151 Personal history of suicidal behavior: Secondary | ICD-10-CM

## 2020-06-29 DIAGNOSIS — R7989 Other specified abnormal findings of blood chemistry: Secondary | ICD-10-CM | POA: Diagnosis not present

## 2020-06-29 DIAGNOSIS — N170 Acute kidney failure with tubular necrosis: Principal | ICD-10-CM | POA: Diagnosis present

## 2020-06-29 DIAGNOSIS — Z79899 Other long term (current) drug therapy: Secondary | ICD-10-CM

## 2020-06-29 DIAGNOSIS — G8929 Other chronic pain: Secondary | ICD-10-CM | POA: Diagnosis present

## 2020-06-29 DIAGNOSIS — G4700 Insomnia, unspecified: Secondary | ICD-10-CM | POA: Diagnosis not present

## 2020-06-29 DIAGNOSIS — E8770 Fluid overload, unspecified: Secondary | ICD-10-CM | POA: Diagnosis present

## 2020-06-29 DIAGNOSIS — F4010 Social phobia, unspecified: Secondary | ICD-10-CM

## 2020-06-29 DIAGNOSIS — D696 Thrombocytopenia, unspecified: Secondary | ICD-10-CM | POA: Diagnosis not present

## 2020-06-29 DIAGNOSIS — Z8261 Family history of arthritis: Secondary | ICD-10-CM

## 2020-06-29 DIAGNOSIS — E871 Hypo-osmolality and hyponatremia: Secondary | ICD-10-CM | POA: Diagnosis present

## 2020-06-29 DIAGNOSIS — S36119A Unspecified injury of liver, initial encounter: Secondary | ICD-10-CM | POA: Diagnosis present

## 2020-06-29 DIAGNOSIS — F4323 Adjustment disorder with mixed anxiety and depressed mood: Secondary | ICD-10-CM | POA: Diagnosis present

## 2020-06-29 DIAGNOSIS — R5381 Other malaise: Secondary | ICD-10-CM | POA: Diagnosis present

## 2020-06-29 DIAGNOSIS — S0081XA Abrasion of other part of head, initial encounter: Secondary | ICD-10-CM | POA: Diagnosis present

## 2020-06-29 DIAGNOSIS — Z7951 Long term (current) use of inhaled steroids: Secondary | ICD-10-CM

## 2020-06-29 DIAGNOSIS — F259 Schizoaffective disorder, unspecified: Secondary | ICD-10-CM | POA: Diagnosis present

## 2020-06-29 DIAGNOSIS — E874 Mixed disorder of acid-base balance: Secondary | ICD-10-CM | POA: Diagnosis present

## 2020-06-29 DIAGNOSIS — E876 Hypokalemia: Secondary | ICD-10-CM | POA: Diagnosis present

## 2020-06-29 DIAGNOSIS — F3130 Bipolar disorder, current episode depressed, mild or moderate severity, unspecified: Secondary | ICD-10-CM | POA: Diagnosis present

## 2020-06-29 DIAGNOSIS — Z7689 Persons encountering health services in other specified circumstances: Secondary | ICD-10-CM

## 2020-06-29 DIAGNOSIS — W19XXXA Unspecified fall, initial encounter: Secondary | ICD-10-CM | POA: Diagnosis present

## 2020-06-29 DIAGNOSIS — T730XXA Starvation, initial encounter: Secondary | ICD-10-CM | POA: Diagnosis present

## 2020-06-29 LAB — CBC
HCT: 47.7 % — ABNORMAL HIGH (ref 36.0–46.0)
Hemoglobin: 15.7 g/dL — ABNORMAL HIGH (ref 12.0–15.0)
MCH: 29.2 pg (ref 26.0–34.0)
MCHC: 32.9 g/dL (ref 30.0–36.0)
MCV: 88.8 fL (ref 80.0–100.0)
Platelets: 190 10*3/uL (ref 150–400)
RBC: 5.37 MIL/uL — ABNORMAL HIGH (ref 3.87–5.11)
RDW: 13.1 % (ref 11.5–15.5)
WBC: 9.1 10*3/uL (ref 4.0–10.5)
nRBC: 0 % (ref 0.0–0.2)

## 2020-06-29 LAB — COMPREHENSIVE METABOLIC PANEL
ALT: 1510 U/L — ABNORMAL HIGH (ref 0–44)
AST: 1362 U/L — ABNORMAL HIGH (ref 15–41)
Albumin: 3.6 g/dL (ref 3.5–5.0)
Alkaline Phosphatase: 83 U/L (ref 38–126)
Anion gap: 13 (ref 5–15)
BUN: 35 mg/dL — ABNORMAL HIGH (ref 6–20)
CO2: 20 mmol/L — ABNORMAL LOW (ref 22–32)
Calcium: 9 mg/dL (ref 8.9–10.3)
Chloride: 98 mmol/L (ref 98–111)
Creatinine, Ser: 4.52 mg/dL — ABNORMAL HIGH (ref 0.44–1.00)
GFR, Estimated: 12 mL/min — ABNORMAL LOW (ref >60)
Glucose, Bld: 78 mg/dL (ref 70–99)
Potassium: 4.1 mmol/L (ref 3.5–5.1)
Sodium: 131 mmol/L — ABNORMAL LOW (ref 135–145)
Total Bilirubin: 2.4 mg/dL — ABNORMAL HIGH (ref 0.3–1.2)
Total Protein: 6.6 g/dL (ref 6.5–8.1)

## 2020-06-29 LAB — RESP PANEL BY RT-PCR (FLU A&B, COVID) ARPGX2
Influenza A by PCR: NEGATIVE
Influenza B by PCR: NEGATIVE
SARS Coronavirus 2 by RT PCR: NEGATIVE

## 2020-06-29 LAB — CK: Total CK: 26755 U/L — ABNORMAL HIGH (ref 38–234)

## 2020-06-29 LAB — I-STAT BETA HCG BLOOD, ED (MC, WL, AP ONLY): I-stat hCG, quantitative: 33.4 m[IU]/mL — ABNORMAL HIGH (ref <5)

## 2020-06-29 LAB — HCG, QUANTITATIVE, PREGNANCY: hCG, Beta Chain, Quant, S: 1 m[IU]/mL (ref <5)

## 2020-06-29 LAB — MAGNESIUM: Magnesium: 2.4 mg/dL (ref 1.7–2.4)

## 2020-06-29 LAB — CBG MONITORING, ED: Glucose-Capillary: 76 mg/dL (ref 70–99)

## 2020-06-29 MED ORDER — FENTANYL CITRATE (PF) 100 MCG/2ML IJ SOLN
50.0000 ug | Freq: Once | INTRAMUSCULAR | Status: AC
Start: 2020-06-29 — End: 2020-06-29
  Administered 2020-06-29: 50 ug via INTRAVENOUS
  Filled 2020-06-29: qty 2

## 2020-06-29 MED ORDER — LACTATED RINGERS IV BOLUS
1000.0000 mL | Freq: Once | INTRAVENOUS | Status: AC
  Administered 2020-06-29: 1000 mL via INTRAVENOUS

## 2020-06-29 MED ORDER — SODIUM CHLORIDE 0.9 % IV BOLUS
1000.0000 mL | Freq: Once | INTRAVENOUS | Status: AC
  Administered 2020-06-29: 1000 mL via INTRAVENOUS

## 2020-06-29 MED ORDER — SODIUM CHLORIDE 0.9% FLUSH
3.0000 mL | Freq: Once | INTRAVENOUS | Status: AC
  Administered 2020-06-29: 3 mL via INTRAVENOUS

## 2020-06-29 MED ORDER — METOCLOPRAMIDE HCL 5 MG/ML IJ SOLN
10.0000 mg | Freq: Once | INTRAMUSCULAR | Status: AC
  Administered 2020-06-29: 10 mg via INTRAVENOUS
  Filled 2020-06-29: qty 2

## 2020-06-29 NOTE — ED Notes (Signed)
Pt refuses blood work at this time.  States she doesn't feel well and doesn't want to have blood drawn at this time.  Explained importance of lab work and she declined at this time.

## 2020-06-29 NOTE — H&P (Incomplete)
Date: 06/29/2020               Patient Name:  Carmen Daniels MRN: [id number]  DOB: Jan 01, 1986 Age / Sex: 35 y.o., female   PCP: [name], MD         Medical Service: Internal Medicine Teaching Service         Attending Physician: Dr. Gilles Chiquito, MD    First Contact: Dr. Marland Kitchen Pager: [phone]  Second Contact: Dr. Harvie Heck, MD Pager: [phone]       After Hours (After 5p/  First Contact Pager: [phone]  weekends / holidays): Second Contact Pager: [phone]   Chief Complaint: Syncope  History of Present Illness: Carmen Daniels is a 35 year old woman with past medical history significant for anxiety, bipolar 1 disorder, borderline personality disorder, history of intentional overdoses, fibromyalgia, migraines, obsessive compulsive disorder, and schizoaffective disorder who presented to [facility] for evaluation of syncopal episode on Friday.   *** She states she hasn't been eating for a long time, she has been trying to lose weight so she hasn't eaten in about a week. She has had about of bottle of water per day during this time. Whenever she's been taking her medications lately she gets really lightheaded. She's fainted in the past before but nothing this bad. She blacked out and hit her face on something. She was about to shower and then everything went dark. She thinks she was out for a few hours because she had to crawl to the door to let her friend in when he showed up. This was two nights ago.  Her legs feel very weak, especially her right leg which is tingling and numb. She has been working them slowly to try to increase the strength. Her whole body hurts, but her right arm has been hurting a lot.  She has not urinated much since her fall. She thinks the last time she urinated was yesterday evening. She does feel like she kind of has to urinate currently. She is swallowing ok.   She used to be rx adderall. Her friend told her after she hit her head that she shouldn't fall asleep so she has  been taking that but doesn't usually take it.   She has been taking her medications except for her abilify which makes her feel like a zombie.     Passed out on Friday. Thinks she was out for hours. Progressive body aches and difficulty walking. Tingling in both legs, right greater than left. Large abrasion to right forehead. Probably rhabdo? LFTs in 1000s. History of overdose. Nothing to self harm.   She has been taking  Klonipin  ambien  Prenatal vitamin   ED Course: On arrival to the ED, patient afebrile, hemodynamically stable, saturating well on room air. Initial EKG with normal sinus rhythm. Labs revealing CMP with sodium 131, CO2 20, BUN 35, Creatinine 4.52, AST 1362, ALT 1510, Total bilirubin 2.4; CBC and Mg unremarkable; I-stat beta hCG blood elevated to 33.4; CK, quantitative hCG, and respiratory panel collected. CT Head and CT Cervical Spine unremarkable; US Abdomen Complete ordered. Patient received 10mg  IV metoclopramide, 2L boluses of fluids, and 27mcg injection of fentanyl. Patient admitted to IMTS.   Medications:  *** Aripiprazole 5mg  daily [place] (duetetrabenazine) 9mg  twice daily Beclomethasone inhaler 1 puff twice daily as needed Botulinum toxin type A, inject 200 units into head and neck muscles every three months Clonazepam 1mg  four times daily Fremanezumab-vgfm ([place]) 225mg  inject into skin every thirty  days Multivitamin daily Sumatriptan 100mg  every two hours as needed for migraine Zolpidem 10mg  nightly  Allergies: Allergies as of 06/29/2020 - Review Complete 05/28/2020  Allergen Reaction Noted  . Keflex [cephalexin] Anaphylaxis 05/01/2013   Past Medical History:  Diagnosis Date  . Acute respiratory failure with hypoxia ([place])   . Acute rhinosinusitis 07/03/2018  . Allergic rhinitis   . Anxiety   . Asthma   . Back pain   . Bipolar disorder ([place])   . Borderline personality disorder ([place])   . Bupropion overdose   . Chronic pain   . Chronic pain  syndrome   . Depression   . Fibromyalgia    generalized  . Hallucinations   . Migraines   . Nasal polyps   . Nausea   . Neck pain   . OCD (obsessive compulsive disorder)   . OCD (obsessive compulsive disorder)   . Ovarian cyst    left  . Pneumonia    2021  . PONV (postoperative nausea and vomiting)   . Schizophrenia ([place])    schizoaffective   Family History: ***  Social History: ***  She lives at an apartment at home by herself.  She only smokes a cigarette every now and then, she was vaping but it gave her gum disease.  She does not use recreational drugs.  She does not drink alcohol.   Review of Systems: A complete ROS was negative except as per HPI. ***  Physical Exam: Blood pressure 123/73, pulse 71, temperature 98.6 F (37 C), resp. rate 10, SpO2 100 %. ***  EKG: personally reviewed my interpretation is***  CXR: personally reviewed my interpretation is***  Assessment & Plan by Problem: Active Problems:   * No active hospital problems. *   Dispo: Admit patient to {STATUS:3044014::"Observation with expected length of stay less than 2 midnights.","Inpatient with expected length of stay greater than 2 midnights."}  Signed: Cato Mulligan, MD 06/29/2020, 10:56 PM  Pager: @[phone] @ After 5pm on weekdays and 1pm on weekends: On Call pager: [phone]

## 2020-06-29 NOTE — ED Provider Notes (Signed)
[facility] EMERGENCY DEPARTMENT Provider Note   CSN: [id number] Arrival date & time: 06/29/20  1650     History Chief Complaint  Patient presents with  . Loss of Consciousness    Carmen Daniels is a 35 y.o. female.  The history is provided by the patient and medical records.   Carmen Daniels is a 35 y.o. female who presents to the Emergency Department complaining of syncope. She states that on Friday night she passed out because she was dizzy. She states that she hit the floor, was unconscious for an unknown amount of time. She states that since that time that she has been having difficulty with ambulation and feeling numbness in bilateral feet. She states that she has not been eating well for the last week. She feels that she has been feeling depressed but is not suicidal, homicidal. She does have body aches, thinks it may be related to passing out. No fevers, chest pain, shortness of breath, nausea, vomiting.     Past Medical History:  Diagnosis Date  . Acute respiratory failure with hypoxia ([place])   . Acute rhinosinusitis 07/03/2018  . Allergic rhinitis   . Anxiety   . Asthma   . Back pain   . Bipolar disorder ([place])   . Borderline personality disorder ([place])   . Bupropion overdose   . Chronic pain   . Chronic pain syndrome   . Depression   . Fibromyalgia    generalized  . Hallucinations   . Migraines   . Nasal polyps   . Nausea   . Neck pain   . OCD (obsessive compulsive disorder)   . OCD (obsessive compulsive disorder)   . Ovarian cyst    left  . Pneumonia    2021  . PONV (postoperative nausea and vomiting)   . Schizophrenia ([place])    schizoaffective    Patient Active Problem List   Diagnosis Date Noted  . Tardive dyskinesia 11/14/2019  . Bipolar I disorder, most recent episode depressed ([place]) 09/13/2019  . Social anxiety disorder 09/13/2019  . Adjustment disorder with mixed anxiety and depressed mood 07/31/2019  . Suicidal ideation   .  Post-operative state 07/19/2019  . Seizures ([place]) 06/08/2019  . Bipolar 1 disorder ([place]) 05/17/2019  . Status epilepticus ([place]) 05/17/2019  . Intentional benzodiazepine overdose ([place]) 03/29/2019  . Intractable chronic 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 status migrainosus 02/08/2019  . History of elective abortion 12/28/2018  . QT prolongation 07/03/2018  . AMS (altered mental status) 07/03/2018  . Serum total bilirubin elevated 07/03/2018  . Syncope 07/02/2018  . Borderline personality disorder ([place]) 04/11/2017  . Bipolar I disorder, current or most recent episode manic, with psychotic features ([place]) 04/10/2017  . Bipolar disorder ([place]) 04/10/2017  . Abnormal MRI of head 04/21/2016  . Chronic migraine without aura 03/27/2015  . Mild persistent asthma 12/30/2014  . Allergic rhinitis due to pollen 12/30/2014  . 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 Nasal polyposis 12/30/2014    Past Surgical History:  Procedure Laterality Date  . ABDOMINAL HYSTERECTOMY    . LAPAROSCOPIC TUBAL LIGATION Bilateral 06/13/2019   Procedure: LAPAROSCOPIC TUBAL LIGATION;  Surgeon: Chancy Milroy, MD;  Location: [facility];  Service: Gynecology;  Laterality: Bilateral;  FILSHIE CLIPS  . NASAL SINUS SURGERY    . TUBAL LIGATION    . tubes in ears    . VAGINAL HYSTERECTOMY Bilateral 01/15/2020   Procedure: HYSTERECTOMY VAGINAL;  Surgeon: Chancy Milroy, MD;  Location:  [facility];  Service: Gynecology;  Laterality: Bilateral;  . WISDOM TOOTH EXTRACTION       OB History   No obstetric history on file.     Family History  Problem Relation Age of Onset  . Healthy Father   . Healthy Mother   . Arthritis Maternal Grandmother   . Arthritis Maternal Grandfather   . Depression Paternal Grandmother     Social History   Tobacco Use  . Smoking status: Current Every Day Smoker    Packs/day: 0.50    Years: 15.00    Pack years: 7.50    Types: Cigarettes  . Smokeless tobacco: Never Used  Vaping  Use  . Vaping Use: Every day  . Substances: Nicotine  Substance Use Topics  . Alcohol use: Never  . Drug use: No    Home Medications Prior to Admission medications   Medication Sig Start Date End Date Taking? Authorizing Provider  ARIPiprazole (ABILIFY) 5 MG tablet TAKE 1 TABLET (5 MG TOTAL) BY MOUTH DAILY. 06/19/20   Salley Slaughter, NP  AUSTEDO 9 MG TABS TAKE 2 TABLETS BY MOUTH TWICE A DAY 04/14/20   Eulis Canner E, NP  beclomethasone (QVAR) 40 MCG/ACT inhaler Inhale 1 puff into the lungs 2 (two) times daily as needed (asthma).     [provider]  botulinum toxin Type A (BOTOX) 100 units SOLR injection Inject 200 Units into the muscle every 3 (three) months. Inject into head and neck muscles 04/30/19   Marcial Pacas, MD  clonazePAM (KLONOPIN) 1 MG tablet Take 1 mg by mouth 4 (four) times daily. 07/14/19   [provider]  Fremanezumab-vfrm (AJOVY) 225 MG/1.5ML SOAJ Inject 225 mg into the skin every 30 (thirty) days. 12/12/19   Marcial Pacas, MD  Multiple Vitamins-Calcium (ONE-A-DAY WOMENS FORMULA PO) Take 1 tablet by mouth daily.    [provider]  SUMAtriptan (IMITREX) 100 MG tablet Take 1 tablet (100 mg total) by mouth every 2 (two) hours as needed for migraine. MAY DOSE 2 PER DAY OR 3 12/12/19   Marcial Pacas, MD  zolpidem (AMBIEN) 10 MG tablet Take 10 mg by mouth at bedtime. 12/31/19   [provider]    Allergies    Keflex [cephalexin]  Review of Systems   Review of Systems  All other systems reviewed and are negative.   Physical Exam Updated Vital Signs BP 132/79   Pulse 72   Temp 98.6 F (37 C)   Resp 10   LMP  (LMP Unknown)   SpO2 97%   Physical Exam Vitals and nursing note reviewed.  Constitutional:      Appearance: She is well-developed.  HENT:     Head: Normocephalic.     Comments: Abrasion to the right temple, right cheek Cardiovascular:     Rate and Rhythm: Normal rate and regular rhythm.  Pulmonary:     Effort:  Pulmonary effort is normal. No respiratory distress.  Abdominal:     Palpations: Abdomen is soft.     Tenderness: There is no abdominal tenderness. There is no guarding or rebound.  Musculoskeletal:        General: No tenderness.     Comments: 2+ DP pulses bilaterally  Skin:    General: Skin is warm and dry.  Neurological:     Mental Status: She is alert and oriented to person, place, and time.     Comments: Altered sensation to light tough in the right lower extremity compared to left lower extremity  for light touch. 4/5 strength in BLE, right slightly weaker than left. Five out of five strength and bilateral upper extremities.  Psychiatric:        Behavior: Behavior normal.     ED Results / Procedures / Treatments   Labs (all labs ordered are listed, but only abnormal results are displayed) Labs Reviewed  CBC - Abnormal; Notable for the following components:      Result Value   RBC 5.37 (*)    Hemoglobin 15.7 (*)    HCT 47.7 (*)    All other components within normal limits  COMPREHENSIVE METABOLIC PANEL - Abnormal; Notable for the following components:   Sodium 131 (*)    CO2 20 (*)    BUN 35 (*)    Creatinine, Ser 4.52 (*)    AST 1,362 (*)    ALT 1,510 (*)    Total Bilirubin 2.4 (*)    GFR, Estimated 12 (*)    All other components within normal limits  CK - Abnormal; Notable for the following components:   Total CK 26,755 (*)    All other components within normal limits  I-STAT BETA HCG BLOOD, ED ([facility], [facility], [facility] ONLY) - Abnormal; Notable for the following components:   I-stat hCG, quantitative 33.4 (*)    All other components within normal limits  RESP PANEL BY RT-PCR (FLU A&B, COVID) ARPGX2  MAGNESIUM  HCG, QUANTITATIVE, PREGNANCY  URINALYSIS, ROUTINE W REFLEX MICROSCOPIC  PROTIME-INR  ACETAMINOPHEN LEVEL  HCV AB W REFLEX TO QUANT PCR  HEPATITIS B SURFACE ANTIGEN  HEPATITIS B SURFACE ANTIBODY, QUANTITATIVE  HEPATITIS C ANTIBODY  SALICYLATE LEVEL  TSH  CBG  MONITORING, ED    EKG EKG Interpretation  Date/Time:  Sunday Jun 29 2020 17:13:53 EDT Ventricular Rate:  95 PR Interval:  126 QRS Duration: 78 QT Interval:  366 QTC Calculation: 459 R Axis:   52 Text Interpretation: Normal sinus rhythm Normal ECG Confirmed by Quintella Reichert [id number]) on 06/29/2020 6:46:19 PM   Radiology CT Head Wo Contrast  Result Date: 06/29/2020 CLINICAL DATA:  Recent fall 2 days ago with persistent headaches and neck pain, initial encounter EXAM: CT HEAD WITHOUT CONTRAST CT CERVICAL SPINE WITHOUT CONTRAST TECHNIQUE: Multidetector CT imaging of the head and cervical spine was performed following the standard protocol without intravenous contrast. Multiplanar CT image reconstructions of the cervical spine were also generated. COMPARISON:  CT of the head from 03/30/2019 FINDINGS: CT HEAD FINDINGS Brain: No evidence of acute infarction, hemorrhage, hydrocephalus, extra-axial collection or mass lesion/mass effect. Vascular: No hyperdense vessel or unexpected calcification. Skull: Normal. Negative for fracture or focal lesion. Sinuses/Orbits: No acute finding. Other: None. CT CERVICAL SPINE FINDINGS Alignment: Mild straightening of the normal cervical lordosis is noted which may be related to muscular spasm. Skull base and vertebrae: 7 cervical segments are well visualized. Vertebral body height is well maintained. Mild osteophytic changes are noted most prominent at C3-4 and C6-7. Mild facet hypertrophic changes are noted. No acute fracture or acute facet abnormality is seen. Soft tissues and spinal canal: Surrounding soft tissue structures are within normal limits. Upper chest: Visualized lung apices are unremarkable. Other: None IMPRESSION: CT of the head: No acute intracranial abnormality noted. CT of the cervical spine: No acute bony abnormality is noted. Mild degenerative changes are seen as well as findings suggestive of muscular spasm. Electronically Signed   By: Inez Catalina  M.D.   On: 06/29/2020 19:53   CT Cervical Spine Wo Contrast  Result Date: 06/29/2020  CLINICAL DATA:  Recent fall 2 days ago with persistent headaches and neck pain, initial encounter EXAM: CT HEAD WITHOUT CONTRAST CT CERVICAL SPINE WITHOUT CONTRAST TECHNIQUE: Multidetector CT imaging of the head and cervical spine was performed following the standard protocol without intravenous contrast. Multiplanar CT image reconstructions of the cervical spine were also generated. COMPARISON:  CT of the head from 03/30/2019 FINDINGS: CT HEAD FINDINGS Brain: No evidence of acute infarction, hemorrhage, hydrocephalus, extra-axial collection or mass lesion/mass effect. Vascular: No hyperdense vessel or unexpected calcification. Skull: Normal. Negative for fracture or focal lesion. Sinuses/Orbits: No acute finding. Other: None. CT CERVICAL SPINE FINDINGS Alignment: Mild straightening of the normal cervical lordosis is noted which may be related to muscular spasm. Skull base and vertebrae: 7 cervical segments are well visualized. Vertebral body height is well maintained. Mild osteophytic changes are noted most prominent at C3-4 and C6-7. Mild facet hypertrophic changes are noted. No acute fracture or acute facet abnormality is seen. Soft tissues and spinal canal: Surrounding soft tissue structures are within normal limits. Upper chest: Visualized lung apices are unremarkable. Other: None IMPRESSION: CT of the head: No acute intracranial abnormality noted. CT of the cervical spine: No acute bony abnormality is noted. Mild degenerative changes are seen as well as findings suggestive of muscular spasm. Electronically Signed   By: Inez Catalina M.D.   On: 06/29/2020 19:53   US Abdomen Complete  Result Date: 06/29/2020 CLINICAL DATA:  35 year old female with elevated LFTs. EXAM: ABDOMEN ULTRASOUND COMPLETE COMPARISON:  None. FINDINGS: Evaluation is limited due to overlying bowel gas and inability of the patient to cooperate with exam.  Gallbladder: No gallstones or wall thickening visualized. No sonographic Murphy sign noted by sonographer. Common bile duct: Diameter: 2 mm Liver: No focal lesion identified. Within normal limits in parenchymal echogenicity. Portal vein is patent on color Doppler imaging with normal direction of blood flow towards the liver. IVC: No abnormality visualized. Pancreas: Visualized portion unremarkable. Spleen: Size and appearance within normal limits. Right Kidney: Length: 12.1 cm. There is increased renal parenchymal echogenicity. No hydronephrosis or shadowing stone. Left Kidney: Length: 11.4 cm. There is increased renal parenchyma echogenicity. No hydronephrosis or shadowing stone. Abdominal aorta: No aneurysm visualized. Other findings: None. IMPRESSION: Echogenic kidneys may represent chronic kidney disease. No hydronephrosis or shadowing stone. Electronically Signed   By: Anner Crete M.D.   On: 06/29/2020 23:48    Procedures Procedures  CRITICAL CARE Performed by: Quintella Reichert   Total critical care time: 35 minutes  Critical care time was exclusive of separately billable procedures and treating other patients.  Critical care was necessary to treat or prevent imminent or life-threatening deterioration.  Critical care was time spent personally by me on the following activities: development of treatment plan with patient and/or surrogate as well as nursing, discussions with consultants, evaluation of patient's response to treatment, examination of patient, obtaining history from patient or surrogate, ordering and performing treatments and interventions, ordering and review of laboratory studies, ordering and review of radiographic studies, pulse oximetry and re-evaluation of patient's condition.  Medications Ordered in ED Medications  sodium chloride flush (NS) 0.9 % injection 3 mL (3 mLs Intravenous Given 06/29/20 2102)  lactated ringers bolus 1,000 mL (0 mLs Intravenous Stopped 06/29/20 2314)   metoCLOPramide (REGLAN) injection 10 mg (10 mg Intravenous Given 06/29/20 2101)  sodium chloride 0.9 % bolus 1,000 mL (1,000 mLs Intravenous New Bag/Given 06/29/20 2302)  fentaNYL (SUBLIMAZE) injection 50 mcg (50 mcg Intravenous Given 06/29/20 2304)  ED Course  I have reviewed the triage vital signs and the nursing notes.  Pertinent labs & imaging results that were available during my care of the patient were reviewed by me and considered in my medical decision making (see chart for details).    MDM Rules/Calculators/A&P                         patient here for evaluation following a syncopal event with head injury on Friday. She does have a healing abrasion on examination with generalized weakness, slightly greatest in the right lower extremity. Labs with acute renal failure, concerning for rhabdomyolysis in setting of symptoms. She was treated with aggressive fluid hydration. Patient updated findings of studies, plan to admit for ongoing treatment.  Final Clinical Impression(s) / ED Diagnoses Final diagnoses:  Acute renal failure, unspecified acute renal failure type [facility])  Non-traumatic rhabdomyolysis    Rx / DC Orders ED Discharge Orders    None       Quintella Reichert, MD 06/29/20 2357

## 2020-06-29 NOTE — ED Triage Notes (Signed)
Pt reports syncopal episode on Friday night.  States she fell and hit R side of face on concrete.  Unsure how long she was unresponsive.  Also states she is unable to walk unassisted.  Numbness/tingling to bilateral feet.  When asked about neck and back pain pt states her entire body hurts.

## 2020-06-29 NOTE — H&P (Signed)
Date: 06/30/2020               Patient Name:  Carmen Daniels MRN: [id number]  DOB: 1985/05/30 Age / Sex: 35 y.o., female   PCP: [name], MD         Medical Service: Internal Medicine Teaching Service         Attending Physician: Dr. Gilles Chiquito, MD    First Contact: Dr. [name], MD Pager: [phone]  Second Contact: Dr. Harvie Heck, MD Pager: [phone]       After Hours (After 5p/  First Contact Pager: [phone]  weekends / holidays): Second Contact Pager: [phone]   Chief Complaint: Syncope  History of Present Illness: Carmen Daniels is a 35 year old female with a documented history of anxiety, bipolar 1 disorder, borderline personality disorder, history of intentional overdose, fibromyalgia, migraines, obsessive compulsive disorder, and schizoaffective disorder who presented to [facility] for evaluation of syncopal episode on Friday.  Patient reports that approximately one week ago she discontinued her prescribed aripiprazole due to the medication making her "feel like a zombie." At the same time, she states that she completely stopped eating food in an attempt to lose weight and has not consumed any food in the past week. She has been drinking approximately a half bottle of water a day over the past week. She reports taking her clonazepam four times daily, tramadol three times daily and zolpidem nightly throughout the week without consumption of additional doses. She denies any suicidal ideation surrounding the past week. On Friday, she states that prior to taking a shower, she began to feel very dizzy, "everything went black" and she passed out. Later that evening, she woke up on the kitchen floor while her friend visiting from out of town was knocking on her front door. She was unable to get up off the ground due to bilateral lower extremity weakness and tingling, so she crawled to the front door to open it. Her friend noticed a large abrasion to her right forehead and recommended that  she take one of her old Adderall pills and helped her into her bed. She spent the remainder of Friday evening and all day Saturday laying in bed. She reports that since awakening, she has had substantial generalized weakness, worse in her bilateral lower extremities, as well as diffuse aching of her arms, abdomen and lower extremities. She reports that her weakness has been improving mildly since arriving in the emergency department.  ED Course: On arrival to the ED, patient noted to be afebrile, hemodynamically stable, saturating well on room air. Initial EKG with normal sinus rhythm. Labs revealing CK of 26,755; CMP with sodium 131, CO2 20, BUN 35, Creatinine 4.52, AST 1362, ALT 1510, Total bilirubin 2.4; CBC and Mg unremarkable; quantitative hCG and respiratory panel unremarkable. CT Head and CT Cervical Spine unremarkable; US Abdomen Complete demonstrated echogenic kidneys. Patient received 10mg  IV metoclopramide, 2L boluses of IV fluids, and 25mcg injection of fentanyl. Patient admitted to IMTS.   Medications:  Aripiprazole 5mg  daily (self discontinued approximately one week ago) Austedo (duetetrabenazine) 9mg  twice daily (not taking) Beclomethasone inhaler 1 puff twice daily as needed Botulinum toxin type A, inject 200 units into head and neck muscles every three months (for migraines) Clonazepam 1mg  four times daily Fremanezumab-vgfm ([place]) 225mg  inject into skin every thirty days (for migraines) Multivitamin daily Sumatriptan 100mg  every two hours as needed for migraine (not taking) Tramadol 50mg  three times daily as needed Zolpidem 10mg  nightly  Allergies:  Allergies as of 06/29/2020 - Review Complete 05/28/2020  Allergen Reaction Noted  . Keflex [cephalexin] Anaphylaxis 05/01/2013   Past Medical History:  Diagnosis Date  . Acute respiratory failure with hypoxia ([place])   . Acute rhinosinusitis 07/03/2018  . Allergic rhinitis   . Anxiety   . Asthma   . Back pain   . Bipolar  disorder ([place])   . Borderline personality disorder ([place])   . Bupropion overdose   . Chronic pain   . Chronic pain syndrome   . Depression   . Fibromyalgia    generalized  . Hallucinations   . Migraines   . Nasal polyps   . Nausea   . Neck pain   . OCD (obsessive compulsive disorder)   . OCD (obsessive compulsive disorder)   . Ovarian cyst    left  . Pneumonia    2021  . PONV (postoperative nausea and vomiting)   . Schizophrenia ([place])    schizoaffective   Family History:  Family History  Problem Relation Age of Onset  . Healthy Father   . Healthy Mother   . Arthritis Maternal Grandmother   . Arthritis Maternal Grandfather   . Depression Paternal Grandmother    Social History:  Patient lives in a first-floor apartment by herself. She reports that her family lives close and comes to visit her. She only smokes a cigarette every now and then, she was vaping but it gave her gum disease. She does not use recreational drugs. She does not drink alcohol.   Review of Systems: A complete ROS was negative except as per HPI.  Physical Exam: Blood pressure 132/79, pulse 72, temperature 98.6 F (37 C), resp. rate 10, SpO2 97 %. Physical Exam Vitals reviewed. Exam conducted with a chaperone present.  Constitutional:      General: She is not in acute distress.    Appearance: She is normal weight. She is ill-appearing.     Comments: Tired-appearing woman, laying supine in ED bed with minimal movement.  HENT:     Mouth/Throat:     Mouth: Mucous membranes are dry.     Pharynx: Oropharynx is clear.  Eyes:     Extraocular Movements: Extraocular movements intact.     Conjunctiva/sclera: Conjunctivae normal.     Pupils: Pupils are equal, round, and reactive to light.     Comments: Injected conjunctivae bilaterally  Cardiovascular:     Rate and Rhythm: Normal rate and regular rhythm.     Heart sounds: Normal heart sounds.     Comments: 1+ dorsalis pedis pulses bilaterally Pulmonary:      Effort: Pulmonary effort is normal. No respiratory distress.     Breath sounds: Normal breath sounds.  Abdominal:     General: Abdomen is flat. Bowel sounds are normal.     Palpations: Abdomen is soft.     Tenderness: There is abdominal tenderness.     Comments: Diffuse superficial abdominal tenderness  Musculoskeletal:     Cervical back: Normal range of motion and neck supple. No rigidity.     Right lower leg: No edema.     Left lower leg: No edema.     Comments: Diffuse tenderness of her bilateral upper and lower extremities. No tenseness of extremities. Plantar flexion of bilateral feet  Skin:    Comments: Cold feet. No discoloration of the extremities  Neurological:     Comments: Alert and oriented x3. Cranial nerves intact. 5/5 strength of bilateral upper extremities. 4/5 strength of proximal lower extremities.  2/5 strength dorsiflexion of right foot, 3/5 dorsiflexion of left foot. 4/5 plantar flexion bilaterally. Sensation to light touch intact except for below the ankles bilaterally.  Psychiatric:        Mood and Affect: Mood normal.        Behavior: Behavior normal.        Thought Content: Thought content normal.        Judgment: Judgment normal.   EKG: personally reviewed my interpretation is normal sinus rhythm with rate of 95  Assessment & Plan by Problem: Active Problems:   AKI (acute kidney injury) ([place])  Lois Slagel is a 35 year old female with a documented history of anxiety, bipolar 1 disorder, borderline personality disorder, history of intentional overdose, fibromyalgia, migraines, obsessive compulsive disorder, and schizoaffective disorder who presented to [facility] for evaluation of syncopal episode on Friday.  #Rhabdomyolysis, active Patient presenting following fasting for approximately one week resulting in significant dehydration and a syncopal episode with an unknown period of downtime. Patient endorsing significant diffuse weakness and muscle pain most  prominent in the lower extremities (right worse than left). Neurologic examination most concerning for lack of sensation of the bilateral feet and weakness with dorsiflexion. Patient does not exhibit evidence of compartment syndrome on examination. CK level resulted 26,755. Patient has associated acute kidney injury and acute liver injury. Patient would benefit from aggressive intravenous fluids. -Status post two liters of IVF in the ED -Start LR at 258mL/hr  -Adjust to maintain urine output of approximately 244mL/hr  -Strict intake and output -CK now then every eight hours -Urinalysis pending -PT eval and treat -OT eval and treat  #Acute kidney injury, active Creatinine elevated to 4.52 from baseline of 0.8. Likely secondary to significant dehydration as well as injury from rhabdomyolysis. Anticipate that this will improve with management of rhabdomyolysis as described above. -Start LR at 219mL/hr  -Adjust to maintain urine output of approximately 2109mL/hr  -Strict intake and output -Monitor renal function on repeat CMP -Urinalysis pending  #Acute hepatocellular liver injury, active Patient presenting with elevations of AST and ALT to 1362 and 1510, respectively, with normal alkaline phosphatase. Patient's acute liver injury secondary to dehydration and rhabdomyolysis. Abdominal ultrasound was unremarkable.  -Monitor AST and ALT on repeat CMP -INR pending -Acetaminophen level pending -Hepatitis panel pending  #Mental health conditions, chronic #History of intentional overdose Patient is living with multiple mental health diagnoses including anxiety, bipolar 1 disorder, borderline personality disorder, obsessive compulsive disorder and schizoaffective disorder. She reports that she discontinued her prescribed aripiprazole prior to her episode of fasting due to adverse effect of "feeling like a zombie." Patient will need to continue following closely with psychiatry to optimize her  medication regimen. -Plan to restart clonazepam 1mg  four times daily in morning -Holding home aripiprazole 5mg  daily at this time -Holding home zolpidem 10mg  nightly  #Fibromyalgia, chronic Patient reports that she takes tramadol 50mg  every three hours for fibromyalgia. Review of PDMP consistent with her description. -Restart home tramadol 50mg  at reduced frequency of twice daily  #Code status: Full code #IVF: LR at 250cc/hr #VTE ppx: Heparin 5,000 units every eight hours #Diet: Regular  Dispo: Admit patient to Inpatient with expected length of stay greater than 2 midnights.  Signed: Cato Mulligan, MD 06/30/2020, 12:48 AM  Pager: [phone] After 5pm on weekdays and 1pm on weekends: On Call pager: [phone]

## 2020-06-30 ENCOUNTER — Inpatient Hospital Stay: Payer: Self-pay

## 2020-06-30 DIAGNOSIS — Z818 Family history of other mental and behavioral disorders: Secondary | ICD-10-CM | POA: Diagnosis not present

## 2020-06-30 DIAGNOSIS — J45909 Unspecified asthma, uncomplicated: Secondary | ICD-10-CM | POA: Diagnosis present

## 2020-06-30 DIAGNOSIS — E876 Hypokalemia: Secondary | ICD-10-CM | POA: Diagnosis not present

## 2020-06-30 DIAGNOSIS — M6282 Rhabdomyolysis: Secondary | ICD-10-CM | POA: Diagnosis present

## 2020-06-30 DIAGNOSIS — G4700 Insomnia, unspecified: Secondary | ICD-10-CM | POA: Diagnosis not present

## 2020-06-30 DIAGNOSIS — F603 Borderline personality disorder: Secondary | ICD-10-CM | POA: Diagnosis not present

## 2020-06-30 DIAGNOSIS — G43909 Migraine, unspecified, not intractable, without status migrainosus: Secondary | ICD-10-CM | POA: Diagnosis present

## 2020-06-30 DIAGNOSIS — E874 Mixed disorder of acid-base balance: Secondary | ICD-10-CM | POA: Diagnosis present

## 2020-06-30 DIAGNOSIS — Z7951 Long term (current) use of inhaled steroids: Secondary | ICD-10-CM | POA: Diagnosis not present

## 2020-06-30 DIAGNOSIS — Z90710 Acquired absence of both cervix and uterus: Secondary | ICD-10-CM | POA: Diagnosis not present

## 2020-06-30 DIAGNOSIS — F17210 Nicotine dependence, cigarettes, uncomplicated: Secondary | ICD-10-CM | POA: Diagnosis present

## 2020-06-30 DIAGNOSIS — Z79899 Other long term (current) drug therapy: Secondary | ICD-10-CM | POA: Diagnosis not present

## 2020-06-30 DIAGNOSIS — E871 Hypo-osmolality and hyponatremia: Secondary | ICD-10-CM | POA: Diagnosis present

## 2020-06-30 DIAGNOSIS — Z8261 Family history of arthritis: Secondary | ICD-10-CM | POA: Diagnosis not present

## 2020-06-30 DIAGNOSIS — N179 Acute kidney failure, unspecified: Secondary | ICD-10-CM

## 2020-06-30 DIAGNOSIS — K5900 Constipation, unspecified: Secondary | ICD-10-CM | POA: Diagnosis not present

## 2020-06-30 DIAGNOSIS — E440 Moderate protein-calorie malnutrition: Secondary | ICD-10-CM | POA: Diagnosis present

## 2020-06-30 DIAGNOSIS — Z888 Allergy status to other drugs, medicaments and biological substances status: Secondary | ICD-10-CM | POA: Diagnosis not present

## 2020-06-30 DIAGNOSIS — F259 Schizoaffective disorder, unspecified: Secondary | ICD-10-CM | POA: Diagnosis present

## 2020-06-30 DIAGNOSIS — N170 Acute kidney failure with tubular necrosis: Secondary | ICD-10-CM | POA: Diagnosis present

## 2020-06-30 DIAGNOSIS — W19XXXA Unspecified fall, initial encounter: Secondary | ICD-10-CM | POA: Diagnosis present

## 2020-06-30 DIAGNOSIS — M797 Fibromyalgia: Secondary | ICD-10-CM | POA: Diagnosis present

## 2020-06-30 DIAGNOSIS — Z20822 Contact with and (suspected) exposure to covid-19: Secondary | ICD-10-CM | POA: Diagnosis present

## 2020-06-30 DIAGNOSIS — G894 Chronic pain syndrome: Secondary | ICD-10-CM | POA: Diagnosis present

## 2020-06-30 DIAGNOSIS — F3130 Bipolar disorder, current episode depressed, mild or moderate severity, unspecified: Secondary | ICD-10-CM | POA: Diagnosis present

## 2020-06-30 DIAGNOSIS — F429 Obsessive-compulsive disorder, unspecified: Secondary | ICD-10-CM | POA: Diagnosis present

## 2020-06-30 DIAGNOSIS — S36119A Unspecified injury of liver, initial encounter: Secondary | ICD-10-CM | POA: Diagnosis present

## 2020-06-30 DIAGNOSIS — R7989 Other specified abnormal findings of blood chemistry: Secondary | ICD-10-CM | POA: Insufficient documentation

## 2020-06-30 DIAGNOSIS — Z6831 Body mass index (BMI) 31.0-31.9, adult: Secondary | ICD-10-CM | POA: Diagnosis not present

## 2020-06-30 DIAGNOSIS — Z9151 Personal history of suicidal behavior: Secondary | ICD-10-CM | POA: Diagnosis not present

## 2020-06-30 DIAGNOSIS — S0081XA Abrasion of other part of head, initial encounter: Secondary | ICD-10-CM | POA: Diagnosis present

## 2020-06-30 LAB — COMPREHENSIVE METABOLIC PANEL
ALT: 987 U/L — ABNORMAL HIGH (ref 0–44)
ALT: 873 U/L — ABNORMAL HIGH (ref 0–44)
AST: 719 U/L — ABNORMAL HIGH (ref 15–41)
AST: 632 U/L — ABNORMAL HIGH (ref 15–41)
Albumin: 2.5 g/dL — ABNORMAL LOW (ref 3.5–5.0)
Albumin: 2.4 g/dL — ABNORMAL LOW (ref 3.5–5.0)
Alkaline Phosphatase: 69 U/L (ref 38–126)
Alkaline Phosphatase: 69 U/L (ref 38–126)
Anion gap: 7 (ref 5–15)
Anion gap: 9 (ref 5–15)
BUN: 35 mg/dL — ABNORMAL HIGH (ref 6–20)
BUN: 35 mg/dL — ABNORMAL HIGH (ref 6–20)
CO2: 23 mmol/L (ref 22–32)
CO2: 21 mmol/L — ABNORMAL LOW (ref 22–32)
Calcium: 8.1 mg/dL — ABNORMAL LOW (ref 8.9–10.3)
Calcium: 8 mg/dL — ABNORMAL LOW (ref 8.9–10.3)
Chloride: 102 mmol/L (ref 98–111)
Chloride: 100 mmol/L (ref 98–111)
Creatinine, Ser: 5.08 mg/dL — ABNORMAL HIGH (ref 0.44–1.00)
Creatinine, Ser: 5.36 mg/dL — ABNORMAL HIGH (ref 0.44–1.00)
GFR, Estimated: 11 mL/min — ABNORMAL LOW (ref >60)
GFR, Estimated: 10 mL/min — ABNORMAL LOW (ref >60)
Glucose, Bld: 74 mg/dL (ref 70–99)
Glucose, Bld: 63 mg/dL — ABNORMAL LOW (ref 70–99)
Potassium: 3.9 mmol/L (ref 3.5–5.1)
Potassium: 4 mmol/L (ref 3.5–5.1)
Sodium: 132 mmol/L — ABNORMAL LOW (ref 135–145)
Sodium: 130 mmol/L — ABNORMAL LOW (ref 135–145)
Total Bilirubin: 1.3 mg/dL — ABNORMAL HIGH (ref 0.3–1.2)
Total Bilirubin: 1.3 mg/dL — ABNORMAL HIGH (ref 0.3–1.2)
Total Protein: 4.8 g/dL — ABNORMAL LOW (ref 6.5–8.1)
Total Protein: 4.7 g/dL — ABNORMAL LOW (ref 6.5–8.1)

## 2020-06-30 LAB — CBC WITH DIFFERENTIAL/PLATELET
Abs Immature Granulocytes: 0.02 10*3/uL (ref 0.00–0.07)
Basophils Absolute: 0.1 10*3/uL (ref 0.0–0.1)
Basophils Relative: 1 %
Eosinophils Absolute: 0.4 10*3/uL (ref 0.0–0.5)
Eosinophils Relative: 5 %
HCT: 35.4 % — ABNORMAL LOW (ref 36.0–46.0)
Hemoglobin: 12.3 g/dL (ref 12.0–15.0)
Immature Granulocytes: 0 %
Lymphocytes Relative: 18 %
Lymphs Abs: 1.3 10*3/uL (ref 0.7–4.0)
MCH: 30 pg (ref 26.0–34.0)
MCHC: 34.7 g/dL (ref 30.0–36.0)
MCV: 86.3 fL (ref 80.0–100.0)
Monocytes Absolute: 0.8 10*3/uL (ref 0.1–1.0)
Monocytes Relative: 11 %
Neutro Abs: 4.6 10*3/uL (ref 1.7–7.7)
Neutrophils Relative %: 65 %
Platelets: 156 10*3/uL (ref 150–400)
RBC: 4.1 MIL/uL (ref 3.87–5.11)
RDW: 13 % (ref 11.5–15.5)
WBC: 7 10*3/uL (ref 4.0–10.5)
nRBC: 0 % (ref 0.0–0.2)

## 2020-06-30 LAB — HIV ANTIBODY (ROUTINE TESTING W REFLEX): HIV Screen 4th Generation wRfx: NONREACTIVE

## 2020-06-30 LAB — TSH: TSH: 4.809 u[IU]/mL — ABNORMAL HIGH (ref 0.350–4.500)

## 2020-06-30 LAB — URINALYSIS, COMPLETE (UACMP) WITH MICROSCOPIC
Bilirubin Urine: NEGATIVE
Glucose, UA: NEGATIVE mg/dL
Ketones, ur: 5 mg/dL — AB
Leukocytes,Ua: NEGATIVE
Nitrite: NEGATIVE
Protein, ur: 30 mg/dL — AB
Specific Gravity, Urine: 1.005 (ref 1.005–1.030)
pH: 6 (ref 5.0–8.0)

## 2020-06-30 LAB — CK
Total CK: 16114 U/L — ABNORMAL HIGH (ref 38–234)
Total CK: 11103 U/L — ABNORMAL HIGH (ref 38–234)
Total CK: 8114 U/L — ABNORMAL HIGH (ref 38–234)

## 2020-06-30 LAB — MAGNESIUM
Magnesium: 2.3 mg/dL (ref 1.7–2.4)
Magnesium: 2.1 mg/dL (ref 1.7–2.4)

## 2020-06-30 LAB — HEPATITIS C ANTIBODY: HCV Ab: NONREACTIVE

## 2020-06-30 LAB — MRSA PCR SCREENING: MRSA by PCR: NEGATIVE

## 2020-06-30 LAB — PROTIME-INR
INR: 1.2 (ref 0.8–1.2)
Prothrombin Time: 15.6 seconds — ABNORMAL HIGH (ref 11.4–15.2)

## 2020-06-30 LAB — FOLATE: Folate: 38.8 ng/mL (ref >5.9)

## 2020-06-30 LAB — RAPID URINE DRUG SCREEN, HOSP PERFORMED
Amphetamines: NOT DETECTED
Barbiturates: NOT DETECTED
Benzodiazepines: NOT DETECTED
Cocaine: NOT DETECTED
Opiates: NOT DETECTED
Tetrahydrocannabinol: NOT DETECTED

## 2020-06-30 LAB — ACETAMINOPHEN LEVEL: Acetaminophen (Tylenol), Serum: <10 ug/mL — ABNORMAL LOW (ref 10–30)

## 2020-06-30 LAB — PHOSPHORUS
Phosphorus: 4.6 mg/dL (ref 2.5–4.6)
Phosphorus: 5 mg/dL — ABNORMAL HIGH (ref 2.5–4.6)

## 2020-06-30 LAB — HEPATITIS B SURFACE ANTIGEN: Hepatitis B Surface Ag: NONREACTIVE

## 2020-06-30 LAB — VITAMIN B12: Vitamin B-12: 1813 pg/mL — ABNORMAL HIGH (ref 180–914)

## 2020-06-30 LAB — SALICYLATE LEVEL: Salicylate Lvl: <7 mg/dL — ABNORMAL LOW (ref 7.0–30.0)

## 2020-06-30 MED ORDER — NICOTINE 14 MG/24HR TD PT24
14.0000 mg | MEDICATED_PATCH | Freq: Every day | TRANSDERMAL | Status: DC
  Administered 2020-06-30 – 2020-07-11 (×12): 14 mg via TRANSDERMAL
  Filled 2020-06-30 (×12): qty 1

## 2020-06-30 MED ORDER — ONDANSETRON HCL 4 MG/2ML IJ SOLN
4.0000 mg | Freq: Four times a day (QID) | INTRAMUSCULAR | Status: DC | PRN
  Administered 2020-06-30 – 2020-07-02 (×2): 4 mg via INTRAVENOUS
  Filled 2020-06-30 (×3): qty 2

## 2020-06-30 MED ORDER — CLONAZEPAM 0.5 MG PO TABS
1.0000 mg | ORAL_TABLET | Freq: Four times a day (QID) | ORAL | Status: DC
  Administered 2020-06-30 – 2020-07-11 (×44): 1 mg via ORAL
  Filled 2020-06-30 (×44): qty 2

## 2020-06-30 MED ORDER — SODIUM CHLORIDE 0.9% FLUSH
10.0000 mL | INTRAVENOUS | Status: DC | PRN
  Administered 2020-07-04: 10 mL

## 2020-06-30 MED ORDER — ONDANSETRON HCL 4 MG PO TABS
4.0000 mg | ORAL_TABLET | Freq: Four times a day (QID) | ORAL | Status: DC | PRN
  Administered 2020-07-10: 4 mg via ORAL
  Filled 2020-06-30 (×2): qty 1

## 2020-06-30 MED ORDER — SODIUM CHLORIDE 0.9% FLUSH
10.0000 mL | Freq: Two times a day (BID) | INTRAVENOUS | Status: DC
  Administered 2020-06-30 – 2020-07-10 (×11): 10 mL

## 2020-06-30 MED ORDER — TRAMADOL HCL 50 MG PO TABS
50.0000 mg | ORAL_TABLET | Freq: Two times a day (BID) | ORAL | Status: DC
  Administered 2020-06-30 (×2): 50 mg via ORAL
  Filled 2020-06-30 (×2): qty 1

## 2020-06-30 MED ORDER — HEPARIN SODIUM (PORCINE) 5000 UNIT/ML IJ SOLN
5000.0000 [IU] | Freq: Three times a day (TID) | INTRAMUSCULAR | Status: DC
  Administered 2020-06-30 – 2020-07-11 (×34): 5000 [IU] via SUBCUTANEOUS
  Filled 2020-06-30 (×33): qty 1

## 2020-06-30 MED ORDER — LACTATED RINGERS IV SOLN: INTRAVENOUS | Status: DC

## 2020-06-30 MED ORDER — HYDROMORPHONE HCL 1 MG/ML IJ SOLN
1.0000 mg | INTRAMUSCULAR | Status: DC | PRN
Start: 2020-06-30 — End: 2020-07-02
  Administered 2020-06-30 – 2020-07-02 (×13): 1 mg via INTRAVENOUS
  Filled 2020-06-30 (×13): qty 1

## 2020-06-30 MED ORDER — CHLORHEXIDINE GLUCONATE CLOTH 2 % EX PADS
6.0000 | MEDICATED_PAD | Freq: Every day | CUTANEOUS | Status: DC
  Administered 2020-07-02 – 2020-07-10 (×9): 6 via TOPICAL

## 2020-06-30 NOTE — Progress Notes (Signed)
Pt continues to endorse pain of 8/10 on a 0 - 10 scale. Urine output over last two hours of barely 60cc. Pt requests her maintenance klonopine. MDs Aslam and [place] notified.

## 2020-06-30 NOTE — Progress Notes (Signed)
Peripherally Inserted Central Catheter Placement  The IV Nurse has discussed with the patient and/or persons authorized to consent for the patient, the purpose of this procedure and the potential benefits and risks involved with this procedure.  The benefits include less needle sticks, lab draws from the catheter, and the patient may be discharged home with the catheter. Risks include, but not limited to, infection, bleeding, blood clot (thrombus formation), and puncture of an artery; nerve damage and irregular heartbeat and possibility to perform a PICC exchange if needed/ordered by physician.  Alternatives to this procedure were also discussed.  Bard Power PICC patient education guide, fact sheet on infection prevention and patient information card has been provided to patient /or left at bedside.    PICC Placement Documentation  PICC Double Lumen 74/82/70 Right Basilic 39 cm 1 cm (Active)  Indication for Insertion or Continuance of Line Prolonged intravenous therapies 06/30/20 1129  Exposed Catheter (cm) 1 cm 06/30/20 1129  Site Assessment Clean;Dry;Intact 06/30/20 1129  Lumen #1 Status Flushed;Saline locked;Blood return noted 06/30/20 1129  Lumen #2 Status Flushed;Saline locked;Blood return noted 06/30/20 1129  Dressing Type Transparent;Securing device 06/30/20 1129  Dressing Status Clean;Dry;Intact 06/30/20 1129  Antimicrobial disc in place? Yes 06/30/20 1129  Dressing Intervention New dressing 06/30/20 1129  Dressing Change Due 07/07/20 06/30/20 [place], Carmen Daniels 06/30/2020, 11:31 AM

## 2020-06-30 NOTE — Progress Notes (Signed)
Subjective:  HD1  Patient evaluated at bedside this AM. Reports continued diffuse muscle aches. Slightly improved overnight with tramadol; however, persistent this morning. Notes her toes feeling "tingly/numb". No chest pain/dyspnea. Mentions she is vegetarian. No bowel movements yet. She says she has to urinate but not able to use bed pan.   Orders:  Vitamin b12/folate  Dilaudid q4h prn  Objective:  Vital signs in last 24 hours: Vitals:   06/30/20 0108 06/30/20 0115 06/30/20 0145 06/30/20 0246  BP:  137/79 135/83 134/80  Pulse:  75 72 71  Resp:  13 16 11   Temp:    97.8 F (36.6 C)  TempSrc:    Oral  SpO2:  100% 100% 100%  Weight: 88.5 kg   93.5 kg  Height: 5\' 11"  (1.803 m)   5\' 11"  (1.803 m)   Physical Exam: General: Laying in bed, no acute distress CV: Regular rate, rhythm. No m/r/g appreciated. Distal pulses 2+ bilaterally. Pulm: Normal work of breathing on room air Abdomen: Soft, mildly diffusely tender, non-distended MSK: Normal bulk, tone. No pitting edema bilaterally. Bilateral foot drop. Neuro: Awake, alert, answering questions appropriately. 3/5 dorsiflexion on L foot, 2/5 on R foot. Bilateral upper extremities 5/5.   CBC Latest Ref Rng & Units 06/30/2020 06/29/2020 01/25/2020  WBC 4.0 - 10.5 K/uL 7.0 9.1 8.5  Hemoglobin 12.0 - 15.0 g/dL 12.3 15.7(H) 15.9(H)  Hematocrit 36.0 - 46.0 % 35.4(L) 47.7(H) 45.1  Platelets 150 - 400 K/uL 156 190 340   BMP Latest Ref Rng & Units 06/30/2020 06/29/2020 01/25/2020  Glucose 70 - 99 mg/dL 74 78 102(H)  BUN 6 - 20 mg/dL 35(H) 35(H) 7  Creatinine 0.44 - 1.00 mg/dL 5.08(H) 4.52(H) 0.80  Sodium 135 - 145 mmol/L 132(L) 131(L) 136  Potassium 3.5 - 5.1 mmol/L 3.9 4.1 3.8  Chloride 98 - 111 mmol/L 102 98 103  CO2 22 - 32 mmol/L 23 20(L) 21(L)  Calcium 8.9 - 10.3 mg/dL 8.1(L) 9.0 9.5   Assessment/Plan: Carmen Daniels is 35yo person living with bipolar 1 disorder, anxiety, depression, previous overdose, fibromyalgia, migraines admitted 5/8  with rhabdomyolysis with acute kidney and liver injuries.  Active Problems:   Acute renal failure [facility])  #Rhabdomyolysis Patient arrived to ED yesterday with dehydration, syncopal episode, and at least 48 hours of inactivity. CK on arrival 26k>16k with aggressive IVF. Thus far patient has not had urine output per RN, will increase IVF to 300cc/hr. PICC placed today w/ DL given will need close laboratory monitoring and patient is a hard stick. Will continue to monitor for signs of compartment syndrome. She continues to have significant diffuse pain, will start dilaudid as needed. - LR 300cc/hr, can up titrate as needed given urine output - Goal UOP 200cc/hr - IV dilaudid 1mg  q4h PRN - F/u CK q8h  - Strict I/O - PT/OT  #Acute renal failure sCr 4.52>5.08 with IVF. Patient has no recorded UOP, no output on the floor per RN today. Likely renal function due to dehydration, possibly component of injury 2/2 rhabdo. Renal ultrasound on arrival without hydronephrosis. Bladder scan earlier this AM 200. Given will need close monitoring of urine output with aggressive IVF, will place Foley. - Foley placement - C/w aggressive IVF - F/u repeat BMP this afternoon - Daily CMP - Strict I/O - Avoid nephrotoxins  #Acute liver injury AST/ALT 1300/1500 > 700/1000 with continued IVF. Work-up thus far negative for other causes of liver injury, including hepatitis A, tylenol, salicylate. INR within normal limits. Most likely  liver injury due to significant dehydration with rhabdomyolysis. Expect continued improvement of liver function with aggressive intravenous fluids. - Daily CMP - F/u hepatitis B/C antibodies  #Bilateral foot drop Patient has bilateral foot drop with decreased sensation on exam. Unclear etiology, no signs of compartment syndrome thus far. Possible patient has peripheral nerve palsy due to external compression during the period of inactivity. Likely will benefit from PT/OT. - PT/OT    #Malnourishment  Patient reports history of decreased po intake over the last week. Reportedly she stopped eating food in an attempt to lose weight. Appears component of psychiatric illness playing a role. Will consult dietician for assistance with nutritional needs. This morning also discussed with patient easing into eating this morning as she is at high risk for refeeding syndrome.  - RD consult, appreciate assistance and recommendations - F/u Mg, phos BID - F/u folate, vitamin B12  #Psychiatric disorders There are numerous psychiatric diagnoses listed under patient's past medical history, including bipolar 1 disorder, anxiety, depression, borderline personality disorder, schizoaffective disorder, obsessive compulsive disorder. Patient would benefit from close follow-up with psychiatry for medications, as she recently stopped taking aripiprazole. Will continue home Klonipin, can re-start home Ambien if patient feels like it is needed. - C/w home clonazepam 1mg  QID - Hold home Abilify, Ambien - F/u outpatient psychiatry  #Fibromyalgia Patient reports tramadol not controlling current pain very well, will add dilaudid to regimen during acute rhabdomyolysis. - C/w home tramadol  - IV dilaudid q4h PRN  DIET: Regular IVF: LR 300cc/hr DVT PPX: heparin BOWEL: n/a CODE: FULL FAM COM: Will call patient's sister this afternoon  Prior to Admission Living Arrangement: Home Anticipated Discharge Location: pending PT/OT eval Barriers to Discharge: medical management Dispo: Anticipated discharge in approximately >2 day(s).   [name], MD 06/30/2020, 6:40 AM Pager: [phone] After 5pm on weekdays and 1pm on weekends: On Call pager [phone]

## 2020-06-30 NOTE — Evaluation (Addendum)
Occupational Therapy Evaluation Patient Details Name: Carmen Daniels MRN: [id number] DOB: [birth year] Today's Date: 06/30/2020    History of Present Illness 35 yo admitted 5/8 with reports of weakness and syncope on 5/6 with unknown period of time on the floor. Pt with acute renal failure and rhabdomyolysis. PMhx: asthma, bipolar, fibromyalgia, OCD, Schizophrenia   Clinical Impression   PTA, pt was living alone and performing ADLs and IADLs; not working or driving. Pt currently requiring Min A for UB ADLs, Max A for LB ADLs, and Mod-Max A for functional mobility with RW. Pt presenting with decreased strength, balance, and activity tolerance. Pt showing increased engagement to participate in BUE exercises and then short distance mobility despite fear of falling.  Pt would benefit from further acute OT to facilitate safe dc. Due to age, PLOF, and change in functional status, recommend dc to CIR for further OT to optimize safety, independence with ADLs, and return to PLOF.     Follow Up Recommendations  CIR (Pending tolerance; may need SNF)    Equipment Recommendations  3 in 1 bedside commode    Recommendations for Other Services PT consult     Precautions / Restrictions Precautions Precautions: Fall      Mobility Bed Mobility Overal bed mobility: Needs Assistance Bed Mobility: Supine to Sit;Sit to Supine     Supine to sit: Supervision Sit to supine: Supervision   General bed mobility comments: Supervision for safety. Increased time    Transfers Overall transfer level: Needs assistance Equipment used: Rolling walker (2 wheeled) Transfers: Sit to/from Stand Sit to Stand: Min assist         General transfer comment: Min A for power up into standing    Balance Overall balance assessment: Needs assistance Sitting-balance support: No upper extremity supported;Feet supported Sitting balance-Leahy Scale: Fair     Standing balance support: Bilateral upper extremity  supported;During functional activity Standing balance-Leahy Scale: Poor Standing balance comment: reliant on UE support                           ADL either performed or assessed with clinical judgement   ADL Overall ADL's : Needs assistance/impaired Eating/Feeding: Set up;Sitting   Grooming: Set up;Sitting   Upper Body Bathing: Minimal assistance;Sitting   Lower Body Bathing: Maximal assistance;Sit to/from stand   Upper Body Dressing : Minimal assistance;Sitting   Lower Body Dressing: Maximal assistance;Sit to/from stand   Toilet Transfer: Minimal assistance;Ambulation;RW Toilet Transfer Details (indicate cue type and reason): Min A for power up and gaining balance         Functional mobility during ADLs: Moderate assistance;Rolling walker (Mod A for maintaining balance with three steps forward and back. Max A for LOB when steping backwards.) General ADL Comments: Pt presenting with decreased cognition, balance, and safety     Vision Baseline Vision/History: Wears glasses Wears Glasses: At all times Patient Visual Report: No change from baseline       Perception     Praxis      Pertinent Vitals/Pain Pain Assessment: Faces Faces Pain Scale: Hurts a little bit Pain Location: generalized Pain Descriptors / Indicators: Discomfort Pain Intervention(s): Monitored during session     Hand Dominance Right   Extremity/Trunk Assessment Upper Extremity Assessment Upper Extremity Assessment: Overall WFL for tasks assessed   Lower Extremity Assessment Lower Extremity Assessment: Generalized weakness (Noting pt standing on the lateral sides of her feet (L>R))   Cervical / Trunk Assessment Cervical / Trunk  Assessment: Normal   Communication Communication Communication: No difficulties (Soft spoken)   Cognition Arousal/Alertness: Awake/alert;Lethargic Behavior During Therapy: Flat affect Overall Cognitive Status: Impaired/Different from baseline Area of  Impairment: Following commands;Problem solving;Awareness;Safety/judgement;Attention                   Current Attention Level: Sustained   Following Commands: Follows one step commands with increased time Safety/Judgement: Decreased awareness of safety;Decreased awareness of deficits Awareness: Intellectual Problem Solving: Slow processing;Requires verbal cues General Comments: Pt requiring increased time throughout. Also presenting with "anxious" thoughts and stating "I can't walk yet", "I dont want to fall," and "my ankles wont hold me." Needing a lot of reassurance. Poor awareness and safety. needing cues throughout   General Comments  Mother present. VSS on RA    Exercises Exercises: General Lower Extremity General Exercises - Lower Extremity Ankle Circles/Pumps: AROM;Both;10 reps;Supine Gluteal Sets: AROM;Both;5 reps;Supine Long Arc Quad: AROM;Both;10 reps;Seated Hip Flexion/Marching: AROM;Both;10 reps;Seated;Standing   Shoulder Instructions      Home Living Family/patient expects to be discharged to:: Private residence Living Arrangements: Alone Available Help at Discharge: Family;Available PRN/intermittently Type of Home: Apartment (first floor) Home Access: Level entry     Home Layout: One level     Bathroom Shower/Tub: Teacher, early years/pre: Standard     Home Equipment: None          Prior Functioning/Environment Level of Independence: Independent        Comments: ADL and IADLs. not driving and working. "I like puppies, swimming, and hanging out with friends"        OT Problem List: Decreased strength;Decreased range of motion;Decreased activity tolerance;Impaired balance (sitting and/or standing);Decreased knowledge of use of DME or AE;Decreased knowledge of precautions;Decreased safety awareness;Decreased cognition      OT Treatment/Interventions: Self-care/ADL training;Therapeutic exercise;Energy conservation;DME and/or AE  instruction;Therapeutic activities;Patient/family education    OT Goals(Current goals can be found in the care plan section) Acute Rehab OT Goals Patient Stated Goal: Get stronger and walk again OT Goal Formulation: With patient Time For Goal Achievement: 07/14/20 Potential to Achieve Goals: Good  OT Frequency: Min 2X/week   Barriers to D/C:            Co-evaluation              AM-PAC OT "6 Clicks" Daily Activity     Outcome Measure Help from another person eating meals?: None Help from another person taking care of personal grooming?: A Little Help from another person toileting, which includes using toliet, bedpan, or urinal?: A Lot Help from another person bathing (including washing, rinsing, drying)?: A Lot Help from another person to put on and taking off regular upper body clothing?: A Little Help from another person to put on and taking off regular lower body clothing?: A Lot 6 Click Score: 16   End of Session Equipment Utilized During Treatment: Rolling walker;Gait belt Nurse Communication: Mobility status  Activity Tolerance: Patient tolerated treatment well Patient left: in bed;with call bell/phone within reach;with family/visitor present  OT Visit Diagnosis: Unsteadiness on feet (R26.81);Other abnormalities of gait and mobility (R26.89);Muscle weakness (generalized) (M62.81)                Time: 7619-5093 OT Time Calculation (min): 30 min Charges:  OT General Charges $OT Visit: 1 Visit OT Evaluation $OT Eval Moderate Complexity: 1 Mod OT Treatments $Therapeutic Exercise: 8-22 mins  Gianina Olinde MSOT, OTR/L Acute Rehab Pager: [phone] Office: [place] 06/30/2020, 4:27  PM

## 2020-06-30 NOTE — Progress Notes (Signed)
PT Cancellation Note  Patient Details Name: Carmen Daniels MRN: [id number] DOB: 03-Dec-1985   Cancelled Treatment:    Reason Eval/Treat Not Completed: Pain limiting ability to participate. Pt declined as patient reports "I am in so much pain right now." RN will be giving pt pain medicine. PT to attempt to return s/p pain medicine as able.  Kittie Plater, PT, DPT Acute Rehabilitation Services Pager #: [phone] Office #: [phone]    Berline Lopes 06/30/2020, 12:04 PM

## 2020-06-30 NOTE — Progress Notes (Signed)
Creatinine clearance of 19, as per Harvie Heck MD okay to proceed in placing PICC.

## 2020-06-30 NOTE — Plan of Care (Signed)
  Problem: Clinical Measurements: Goal: Will remain free from infection Outcome: Progressing Goal: Respiratory complications will improve Outcome: Progressing Goal: Cardiovascular complication will be avoided Outcome: Progressing   Problem: Elimination: Goal: Will not experience complications related to bowel motility Outcome: Progressing Goal: Will not experience complications related to urinary retention Outcome: Progressing   Problem: Safety: Goal: Ability to remain free from injury will improve Outcome: Progressing   Problem: Skin Integrity: Goal: Risk for impaired skin integrity will decrease Outcome: Progressing   Problem: Education: Goal: Knowledge of General Education information will improve Description: Including pain rating scale, medication(s)/side effects and non-pharmacologic comfort measures Outcome: Not Progressing   Problem: Health Behavior/Discharge Planning: Goal: Ability to manage health-related needs will improve Outcome: Not Progressing   Problem: Clinical Measurements: Goal: Ability to maintain clinical measurements within normal limits will improve Outcome: Not Progressing Goal: Diagnostic test results will improve Outcome: Not Progressing   Problem: Activity: Goal: Risk for activity intolerance will decrease Outcome: Not Progressing   Problem: Nutrition: Goal: Adequate nutrition will be maintained Outcome: Not Progressing   Problem: Coping: Goal: Level of anxiety will decrease Outcome: Not Progressing   Problem: Pain Managment: Goal: General experience of comfort will improve Outcome: Not Progressing   

## 2020-06-30 NOTE — Progress Notes (Signed)
Patient is eating and drinking by mouth.  Patient has a foley due to retention and was out puting only 30 ml/hr.  350 ml output after flushing foley with 10 ml.  Will continue to monitor hourly tonight and obtain order for nicotine patch.

## 2020-06-30 NOTE — Progress Notes (Signed)
Paged by RN regarding rate of IVF and need for nicotine patch.  Evaluated patient at bedside. She reports that she is doing well, just tired. She denies any new complaints or concerns including shortness of breath, cough, or swelling of extremities. Objectively, patient's vitals are BP of 148/98, HR 79, RR 13, SpO2 99%. Slight trace pitting edema of bilateral lower extremities. Lungs clear to auscultation bilaterally. Patient is tolerating rate of 400cc/hr at this time with no signs of volume overload at this time. Will need to monitor closely as patient is having minimal urine output although RN was able to flush foley catheter and have approximately 350cc of urine output during her shift thus far. -Continue current rate of 400cc/hr -Monitor urine output -Discussed case with RN who will continueto monitor patient closely and notify team of any acute change -Nicotine patch applied

## 2020-07-01 DIAGNOSIS — E440 Moderate protein-calorie malnutrition: Secondary | ICD-10-CM | POA: Insufficient documentation

## 2020-07-01 LAB — COMPREHENSIVE METABOLIC PANEL
ALT: 615 U/L — ABNORMAL HIGH (ref 0–44)
AST: 339 U/L — ABNORMAL HIGH (ref 15–41)
Albumin: 2.2 g/dL — ABNORMAL LOW (ref 3.5–5.0)
Alkaline Phosphatase: 66 U/L (ref 38–126)
Anion gap: 7 (ref 5–15)
BUN: 33 mg/dL — ABNORMAL HIGH (ref 6–20)
CO2: 23 mmol/L (ref 22–32)
Calcium: 8 mg/dL — ABNORMAL LOW (ref 8.9–10.3)
Chloride: 102 mmol/L (ref 98–111)
Creatinine, Ser: 5.67 mg/dL — ABNORMAL HIGH (ref 0.44–1.00)
GFR, Estimated: 9 mL/min — ABNORMAL LOW (ref >60)
Glucose, Bld: 64 mg/dL — ABNORMAL LOW (ref 70–99)
Potassium: 4.4 mmol/L (ref 3.5–5.1)
Sodium: 132 mmol/L — ABNORMAL LOW (ref 135–145)
Total Bilirubin: 1.2 mg/dL (ref 0.3–1.2)
Total Protein: 4.3 g/dL — ABNORMAL LOW (ref 6.5–8.1)

## 2020-07-01 LAB — CBC
HCT: 33.7 % — ABNORMAL LOW (ref 36.0–46.0)
Hemoglobin: 11.5 g/dL — ABNORMAL LOW (ref 12.0–15.0)
MCH: 29.8 pg (ref 26.0–34.0)
MCHC: 34.1 g/dL (ref 30.0–36.0)
MCV: 87.3 fL (ref 80.0–100.0)
Platelets: 141 10*3/uL — ABNORMAL LOW (ref 150–400)
RBC: 3.86 MIL/uL — ABNORMAL LOW (ref 3.87–5.11)
RDW: 13.1 % (ref 11.5–15.5)
WBC: 5.9 10*3/uL (ref 4.0–10.5)
nRBC: 0 % (ref 0.0–0.2)

## 2020-07-01 LAB — PHOSPHORUS
Phosphorus: 4.9 mg/dL — ABNORMAL HIGH (ref 2.5–4.6)
Phosphorus: 4.8 mg/dL — ABNORMAL HIGH (ref 2.5–4.6)

## 2020-07-01 LAB — CK
Total CK: 6277 U/L — ABNORMAL HIGH (ref 38–234)
Total CK: 4925 U/L — ABNORMAL HIGH (ref 38–234)

## 2020-07-01 LAB — HCV INTERPRETATION

## 2020-07-01 LAB — HCV AB W REFLEX TO QUANT PCR: HCV Ab: <0.1 s/co ratio (ref 0.0–0.9)

## 2020-07-01 LAB — MAGNESIUM
Magnesium: 1.8 mg/dL (ref 1.7–2.4)
Magnesium: 1.7 mg/dL (ref 1.7–2.4)

## 2020-07-01 LAB — HEPATITIS B SURFACE ANTIBODY, QUANTITATIVE: Hep B S AB Quant (Post): >1000 m[IU]/mL (ref >9.9)

## 2020-07-01 MED ORDER — FUROSEMIDE 10 MG/ML IJ SOLN
40.0000 mg | Freq: Two times a day (BID) | INTRAMUSCULAR | Status: DC
  Administered 2020-07-01 – 2020-07-05 (×8): 40 mg via INTRAVENOUS
  Filled 2020-07-01 (×8): qty 4

## 2020-07-01 MED ORDER — STERILE WATER FOR INJECTION IV SOLN
INTRAVENOUS | Status: DC
  Filled 2020-07-01 (×15): qty 1000

## 2020-07-01 MED ORDER — DEXTROSE 50 % IV SOLN: 12.5000 g | INTRAVENOUS | Status: AC

## 2020-07-01 MED ORDER — ADULT MULTIVITAMIN W/MINERALS CH
1.0000 | ORAL_TABLET | Freq: Every day | ORAL | Status: DC
  Administered 2020-07-01 – 2020-07-11 (×11): 1 via ORAL
  Filled 2020-07-01 (×11): qty 1

## 2020-07-01 NOTE — Discharge Instructions (Signed)
Tips to Support Weight Loss You will have a better chance of succeeding at weight loss by changing your eating habits. Changing habits can be difficult, so this handout offers tips to help you achieve your goal.  Set Yourself up for Success Keeping a food diary to record everything you eat and drink can help you be more mindful of your food choices. Your diary is your tool, so use whatever approach you like best: smartphone app, website, or paper/pencil. Record what you eat or drink immediately after you finish eating. It might seem overwhelming to write down everything. Start with the meal or time of day which you find the greatest challenge to manage.  Additional strategies that can set you up for success:  Measure your foods to be sure they match the portions on your daily plan. Keep your measuring cups and food scale on the kitchen counter to help remember to measure.  Find a friend to support your change in habits. Be sure to tell your friend what kind of help will meet your needs. For example, do you need a walking partner, or do you want a sounding board for when the eating plan is challenging?  Believe in yourself. Changing behavior is not just about willpower. It is also about believing you can do it. Your registered dietitian nutritionist (RDN) can recommend resources to help you succeed.  Consider adopting the following strategies to help with weight loss:  Keep fresh fruit on your kitchen counter or at eye-level in the refrigerator so you are reminded it's available for a snack.  Consider using a meal delivery service for a few weeks to decrease trips to the grocery store, cut down on meal preparation effort, and to get recipe ideas. To balance the cost of this service, cut back on dining out.  Add your own vegetables to frozen meals designed for weight-loss to get extra nutrients.  Enjoy liquid meal replacements with a side dish of salad or fruit.  Cooking to Lose Weight If  you never cook for yourself, now is a great time to start! Cooking for yourself is one of the best ways to control your diet and know exactly what ingredients are in the food.  Think about how you prepare foods to figure out where you can cut some calories and fat without losing flavor. You may not even realize how many calories are adding up with certain cooking methods. Here are some ideas:  Oil   Use a nonstick skillet and use just a splash of oil (1-2 tablespoons) instead of deep frying.  Seasoning   Adding herbs and spices can help you boost flavor while cutting down on fats like butter and oil. Parsley, chive, garlic, onion powder, paprika, pepper, and ginger will help make meals more flavorful.  Dairy   Use lower-fat dairy products in place of full-fat versions. For example, use low-fat or skim [place] yogurt instead of sour cream, or skim or 1% milk instead of 2% or whole milk, or light margarine instead of solid margarine or butter.  Vegetables   Vegetables taste better when roasted or browned. Try it with your favorite vegetables. Find ways to add vegetables to your meals, like scrambled eggs with green peppers, roasted broccoli in macaroni and cheese, or finely diced mushrooms and zucchini in pasta sauce, or vegetables instead of meat on pizza.  Fruit   Find interesting ways to prepare fruits. For example, eat apple wedges with a dab of peanut butter and sprinkle of cinnamon  or add a dollop of light whipped cream and a teaspoon of granola to fresh or canned peaches.  Copyright Academy of [employer] Dietetics

## 2020-07-01 NOTE — Plan of Care (Signed)
Discussed with patient plan of care for the evening, pain management and potential need for nicotine patch with some teach back displayed.  Problem: Education: Goal: Knowledge of General Education information will improve Description: Including pain rating scale, medication(s)/side effects and non-pharmacologic comfort measures 07/01/2020 0036 by Jannette Fogo, RN Outcome: Progressing  Problem: Pain Managment: Goal: General experience of comfort will improve Outcome: Progressing

## 2020-07-01 NOTE — Progress Notes (Signed)
Orthopedic Tech Progress Note Patient Details:  Carmen Daniels 1985-10-03 [id number]  Ortho Devices Type of Ortho Device: Prafo boot/shoe Ortho Device/Splint Location: BLE Ortho Device/Splint Interventions: Ordered,Application,Adjustment   Post Interventions Patient Tolerated: Well Instructions Provided: Care of [place] 07/01/2020, 11:50 AM

## 2020-07-01 NOTE — Progress Notes (Signed)
Initial Nutrition Assessment  DOCUMENTATION CODES:  Non-severe (moderate) malnutrition in context of chronic illness  INTERVENTION:  Continue current diet order.  Add Magic cup TID with snacks, each supplement provides 290 kcal and 9 grams of protein.  Add snacks TID - RD to order.  Add MVI with minerals daily.  NUTRITION DIAGNOSIS:  Moderate Malnutrition related to chronic illness as evidenced by per patient/family report,percent weight loss,energy intake < or equal to 50% for > or equal to 5 days,mild muscle depletion,energy intake < or equal to 75% for > or equal to 1 month.  GOAL:  Patient will meet greater than or equal to 90% of their needs  MONITOR:  PO intake,Supplement acceptance,Labs,Weight trends,Skin,I & O's  REASON FOR ASSESSMENT:  Consult Assessment of nutrition requirement/status,Diet education  ASSESSMENT:  35 yo female with a PMH of anxiety, bipolar 1 disorder, fibromyalgia, migraines, and OCD who presents with Rhabdomyolysis, AKI, and elevated LFTs.  Spoke with pt at bedside. Pt reports not liking hospital food, but she has been eating food that her mother brings from outside the hospital and in-hospital [employer]. She reports that she has been trying to lose weight, but reports it is the "unhealthy way" by not eating. She reports poor PO intake the last 5 weeks and has been eating next to nothing the last week PTA.  She reports a 40-50 lb weight loss, but is unsure over how long it has been going on for. On admission, pt weighed 88.5 kg and in November 2021, she weighed 104.5 kg. This is a 35 lb weight loss and 15% loss of body weight, which is significant and severe.  On exam, pt did not have many true depletions, but RD could feel where pt had lost weight. Mild edema present in BLE on exam, this may be masking loss.  Given this, pt is moderately malnourished, at risk for severe. Also concerned regarding her weight loss method. RD explained the danger of long-term  starvation and the way to lose weight is to eat consistently. Pt understanding, but unsure if agreeable.  Recommend adding Magic Cup (orange) TID with meals and snacks TID - RD to order. In discharge instructions, will refer pt regarding weight management counseling through outpatient RD, as well as information for healthy weight loss and management.  Medications: reviewed; LR @ 400 ml/hr, Dilaudid PRN (given 3 times today) Labs: reviewed  NUTRITION - FOCUSED PHYSICAL EXAM: Flowsheet Row Most Recent Value  Orbital Region No depletion  Upper Arm Region No depletion  Thoracic and Lumbar Region No depletion  Buccal Region No depletion  Temple Region Mild depletion  Clavicle Bone Region No depletion  Clavicle and Acromion Bone Region No depletion  Scapular Bone Region No depletion  Dorsal Hand Mild depletion  Patellar Region No depletion  Anterior Thigh Region No depletion  Posterior Calf Region No depletion  Edema (RD Assessment) Mild  [BLE]  Hair Reviewed  Eyes Reviewed  Mouth Reviewed  Skin Reviewed  Nails Reviewed     Diet Order:   Diet Order            Diet regular Room service appropriate? Yes; Fluid consistency: Thin  Diet effective now                EDUCATION NEEDS:  Education needs have been addressed  Skin:  Skin Assessment: Reviewed RN Assessment (Abrasion on face, dry skin)  Last BM:  PTA/unknown  Height:  Ht Readings from Last 1 Encounters:  06/30/20 5\' 11"  (1.803  m)   Weight:  Wt Readings from Last 1 Encounters:  07/01/20 101.1 kg   Ideal Body Weight:  70.5 kg  BMI:  Body mass index is 31.09 kg/m.  Estimated Nutritional Needs: based on IBW Kcal:  1950-2150 Protein:  95-110 grams Fluid:  >2 L  Derrel Nip, RD, LDN Registered Dietitian After Hours/Weekend Pager # in [place]

## 2020-07-01 NOTE — Consult Note (Signed)
[facility] Renal Consultation Note  Requesting MD: Daryll Drown Indication for Consultation: AKI, rhabdomyolysis  HPI:  Carmen Daniels is a 35 y.o. female with past medical history significant for anxiety, bipolar disorder, OCD, migraines and fibromyalgia.  According to the chart, she self discontinued her aripiprazole.  She then in an attempt to lose weight restricted her food and liquid intake-  She became dehydrated, passed out and stayed down for 8 hours- then crawled to her bed, stayed another 2 days before presenting to hospital on 5/8 with weakness.  She had AKI and an elevated CK, crt 4.5.  Crt normal in December of 2021. She has been admitted since- given over 9 liters of volume-  1200 out-  crt has increased to 5.6 today.  CK has decreased from 8000 to 4900 today. She is not currently getting any IVF-  She is laying flat.  She complains of leg pain but working with PT and they say she has made progress.  No nausea and no overwhelming edema   Creatinine, Ser  Date/Time Value Ref Range Status  07/01/2020 05:00 AM 5.67 (H) 0.44 - 1.00 mg/dL Final  06/30/2020 02:45 PM 5.36 (H) 0.44 - 1.00 mg/dL Final  06/30/2020 08:21 AM 5.08 (H) 0.44 - 1.00 mg/dL Final  06/29/2020 08:14 PM 4.52 (H) 0.44 - 1.00 mg/dL Final  01/25/2020 07:08 PM 0.80 0.44 - 1.00 mg/dL Final  01/20/2020 01:35 PM 0.79 0.44 - 1.00 mg/dL Final  01/16/2020 12:35 AM 0.78 0.44 - 1.00 mg/dL Final  01/11/2020 11:08 AM 0.90 0.44 - 1.00 mg/dL Final  01/11/2020 10:48 AM 0.99 0.44 - 1.00 mg/dL Final  07/30/2019 06:15 PM 0.79 0.44 - 1.00 mg/dL Final  06/20/2019 12:17 PM 0.73 0.44 - 1.00 mg/dL Final  04/06/2019 02:38 AM 0.68 0.44 - 1.00 mg/dL Final  04/05/2019 04:04 AM 0.63 0.44 - 1.00 mg/dL Final  04/04/2019 05:00 AM 0.63 0.44 - 1.00 mg/dL Final  04/03/2019 04:33 AM 0.58 0.44 - 1.00 mg/dL Final  04/02/2019 09:00 AM 0.74 0.44 - 1.00 mg/dL Final  03/31/2019 04:44 AM 0.52 0.44 - 1.00 mg/dL Final  03/30/2019 10:00 AM 0.72  0.44 - 1.00 mg/dL Final  03/29/2019 06:16 PM 0.65 0.44 - 1.00 mg/dL Final  03/29/2019 02:57 AM 0.76 0.44 - 1.00 mg/dL Final  03/28/2019 09:25 PM 0.81 0.44 - 1.00 mg/dL Final  02/21/2019 11:28 AM 0.74 0.44 - 1.00 mg/dL Final  11/30/2018 12:45 PM 0.69 0.44 - 1.00 mg/dL Final  11/08/2018 11:08 PM 0.75 0.44 - 1.00 mg/dL Final  07/04/2018 03:47 AM 0.83 0.44 - 1.00 mg/dL Final  07/03/2018 03:38 AM 0.78 0.44 - 1.00 mg/dL Final  07/02/2018 09:10 PM 0.89 0.44 - 1.00 mg/dL Final  04/09/2017 11:28 PM 0.77 0.44 - 1.00 mg/dL Final  11/24/2015 08:25 PM 0.73 0.44 - 1.00 mg/dL Final  06/14/2013 03:42 PM 0.70 0.50 - 1.10 mg/dL Final  05/01/2013 10:55 PM 0.80 0.50 - 1.10 mg/dL Final  09/07/2007 02:10 AM 0.8  Final     PMHx:   Past Medical History:  Diagnosis Date  . Acute respiratory failure with hypoxia ([place])   . Acute rhinosinusitis 07/03/2018  . Allergic rhinitis   . Anxiety   . Asthma   . Back pain   . Bipolar disorder ([place])   . Borderline personality disorder ([place])   . Bupropion overdose   . Chronic pain   . Chronic pain syndrome   . Depression   . Fibromyalgia    generalized  . Hallucinations   . Migraines   .  Nasal polyps   . Nausea   . Neck pain   . OCD (obsessive compulsive disorder)   . OCD (obsessive compulsive disorder)   . Ovarian cyst    left  . Pneumonia    2021  . PONV (postoperative nausea and vomiting)   . Schizophrenia ([place])    schizoaffective    Past Surgical History:  Procedure Laterality Date  . ABDOMINAL HYSTERECTOMY    . LAPAROSCOPIC TUBAL LIGATION Bilateral 06/13/2019   Procedure: LAPAROSCOPIC TUBAL LIGATION;  Surgeon: Chancy Milroy, MD;  Location: [place];  Service: Gynecology;  Laterality: Bilateral;  FILSHIE CLIPS  . NASAL SINUS SURGERY    . TUBAL LIGATION    . tubes in ears    . VAGINAL HYSTERECTOMY Bilateral 01/15/2020   Procedure: HYSTERECTOMY VAGINAL;  Surgeon: Chancy Milroy, MD;  Location: [place];  Service: Gynecology;   Laterality: Bilateral;  . WISDOM TOOTH EXTRACTION      Family Hx:  Family History  Problem Relation Age of Onset  . Healthy Father   . Healthy Mother   . Arthritis Maternal Grandmother   . Arthritis Maternal Grandfather   . Depression Paternal Grandmother     Social History:  reports that she has been smoking cigarettes. She has a 7.50 pack-year smoking history. She has never used smokeless tobacco. She reports that she does not drink alcohol and does not use drugs.  Allergies:  Allergies  Allergen Reactions  . Keflex [Cephalexin] Anaphylaxis    Medications: Prior to Admission medications   Medication Sig Start Date End Date Taking? Authorizing Provider  ARIPiprazole (ABILIFY) 5 MG tablet TAKE 1 TABLET (5 MG TOTAL) BY MOUTH DAILY. 06/19/20  Yes Eulis Canner E, NP  beclomethasone (QVAR) 40 MCG/ACT inhaler Inhale 1 puff into the lungs 2 (two) times daily as needed (asthma).    Yes [provider]  botulinum toxin Type A (BOTOX) 100 units SOLR injection Inject 200 Units into the muscle every 3 (three) months. Inject into head and neck muscles 04/30/19  Yes Marcial Pacas, MD  clonazePAM (KLONOPIN) 1 MG tablet Take 1 mg by mouth 4 (four) times daily. 07/14/19  Yes [provider]  Fremanezumab-vfrm (AJOVY) 225 MG/1.5ML SOAJ Inject 225 mg into the skin every 30 (thirty) days. 12/12/19  Yes Marcial Pacas, MD  gabapentin (NEURONTIN) 300 MG capsule Take 1 capsule by mouth 3 (three) times daily as needed for pain. 04/18/20  Yes [provider]  ibuprofen (ADVIL) 200 MG tablet Take 400 mg by mouth every 6 (six) hours as needed for mild pain.   Yes [provider]  Multiple Vitamins-Calcium (ONE-A-DAY WOMENS FORMULA PO) Take 1 tablet by mouth daily.   Yes [provider]  SUMAtriptan (IMITREX) 100 MG tablet Take 1 tablet (100 mg total) by mouth every 2 (two) hours as needed for migraine. MAY DOSE 2 PER DAY OR 3 12/12/19  Yes Marcial Pacas, MD  traMADol  (ULTRAM) 50 MG tablet Take 50 mg by mouth 4 (four) times daily as needed for pain. 02/28/20  Yes [provider]  zolpidem (AMBIEN) 10 MG tablet Take 10 mg by mouth at bedtime. 12/31/19  Yes [provider]  AUSTEDO 9 MG TABS TAKE 2 TABLETS BY MOUTH TWICE A DAY Patient not taking: Reported on 06/30/2020 04/14/20   Salley Slaughter, NP    I have reviewed the patient's current medications.  Labs:  Results for orders placed or performed during the hospital encounter of 06/29/20 (from the  past 48 hour(s))  CBG monitoring, ED     Status: None   Collection Time: 06/29/20  7:14 PM  Result Value Ref Range   Glucose-Capillary 76 70 - 99 mg/dL    Comment: Glucose reference range applies only to samples taken after fasting for at least 8 hours.  CBC     Status: Abnormal   Collection Time: 06/29/20  8:14 PM  Result Value Ref Range   WBC 9.1 4.0 - 10.5 K/uL   RBC 5.37 (H) 3.87 - 5.11 MIL/uL   Hemoglobin 15.7 (H) 12.0 - 15.0 g/dL   HCT 51.8 (H) 84.1 - 66.0 %   MCV 88.8 80.0 - 100.0 fL   MCH 29.2 26.0 - 34.0 pg   MCHC 32.9 30.0 - 36.0 g/dL   RDW 63.0 16.0 - 10.9 %   Platelets 190 150 - 400 K/uL   nRBC 0.0 0.0 - 0.2 %    Comment: Performed at [facility], [address] [place], [place] [address]  Comprehensive metabolic panel     Status: Abnormal   Collection Time: 06/29/20  8:14 PM  Result Value Ref Range   Sodium 131 (L) 135 - 145 mmol/L   Potassium 4.1 3.5 - 5.1 mmol/L   Chloride 98 98 - 111 mmol/L   CO2 20 (L) 22 - 32 mmol/L   Glucose, Bld 78 70 - 99 mg/dL    Comment: Glucose reference range applies only to samples taken after fasting for at least 8 hours.   BUN 35 (H) 6 - 20 mg/dL   Creatinine, Ser 7.32 (H) 0.44 - 1.00 mg/dL   Calcium 9.0 8.9 - 20.2 mg/dL   Total Protein 6.6 6.5 - 8.1 g/dL   Albumin 3.6 3.5 - 5.0 g/dL   AST 5,427 (H) 15 - 41 U/L   ALT 1,510 (H) 0 - 44 U/L   Alkaline Phosphatase 83 38 - 126 U/L   Total Bilirubin 2.4 (H) 0.3 - 1.2 mg/dL   GFR,  Estimated 12 (L) >60 mL/min    Comment: (NOTE) Calculated using the CKD-EPI Creatinine Equation (2021)    Anion gap 13 5 - 15    Comment: Performed at [facility], [address], [place], [place] [address]  Magnesium     Status: None   Collection Time: 06/29/20  8:14 PM  Result Value Ref Range   Magnesium 2.4 1.7 - 2.4 mg/dL    Comment: Performed at [facility], [address] [place], [place] [address]  CK     Status: Abnormal   Collection Time: 06/29/20  8:14 PM  Result Value Ref Range   Total CK 26,755 (H) 38 - 234 U/L    Comment: RESULTS CONFIRMED BY MANUAL DILUTION Performed at [facility], [address], [place], [place] [address]   hCG, quantitative, pregnancy     Status: None   Collection Time: 06/29/20  9:00 PM  Result Value Ref Range   hCG, Beta Chain, Quant, S 1 <5 mIU/mL    Comment:          GEST. AGE      CONC.  (mIU/mL)   <=1 WEEK        5 - 50     2 WEEKS       50 - 500     3 WEEKS       100 - 10,000     4 WEEKS     1,000 - 30,000  5 WEEKS     3,500 - 115,000   6-8 WEEKS     12,000 - 270,000    12 WEEKS     15,000 - 220,000        FEMALE AND NON-PREGNANT FEMALE:     LESS THAN 5 mIU/mL Performed at [facility], [place] [address], [place], [place] [address]   I-Stat beta hCG blood, ED     Status: Abnormal   Collection Time: 06/29/20  9:09 PM  Result Value Ref Range   I-stat hCG, quantitative 33.4 (H) <5 mIU/mL   Comment 3            Comment:   GEST. AGE      CONC.  (mIU/mL)   <=1 WEEK        5 - 50     2 WEEKS       50 - 500     3 WEEKS       100 - 10,000     4 WEEKS     1,000 - 30,000        FEMALE AND NON-PREGNANT FEMALE:     LESS THAN 5 mIU/mL   Resp Panel by RT-PCR (Flu A&B, Covid) Nasopharyngeal Swab     Status: None   Collection Time: 06/29/20 10:45 PM   Specimen: Nasopharyngeal Swab; Nasopharyngeal(NP) swabs in vial transport medium  Result Value Ref Range   SARS Coronavirus 2 by RT PCR NEGATIVE NEGATIVE    Comment:  (NOTE) SARS-CoV-2 target nucleic acids are NOT DETECTED.  The SARS-CoV-2 RNA is generally detectable in upper respiratory specimens during the acute phase of infection. The lowest concentration of SARS-CoV-2 viral copies this assay can detect is 138 copies/mL. A negative result does not preclude SARS-Cov-2 infection and should not be used as the sole basis for treatment or other patient management decisions. A negative result may occur with  improper specimen collection/handling, submission of specimen other than nasopharyngeal swab, presence of viral mutation(s) within the areas targeted by this assay, and inadequate number of viral copies(<138 copies/mL). A negative result must be combined with clinical observations, patient history, and epidemiological information. The expected result is Negative.  Fact Sheet for Patients:  EntrepreneurPulse.com.au  Fact Sheet for Healthcare Providers:  IncredibleEmployment.be  This test is no t yet approved or cleared by the [place] FDA and  has been authorized for detection and/or diagnosis of SARS-CoV-2 by FDA under an Emergency Use Authorization (EUA). This EUA will remain  in effect (meaning this test can be used) for the duration of the COVID-19 declaration under Section 564(b)(1) of the Act, 21 U.S.C.section 360bbb-3(b)(1), unless the authorization is terminated  or revoked sooner.       Influenza A by PCR NEGATIVE NEGATIVE   Influenza B by PCR NEGATIVE NEGATIVE    Comment: (NOTE) The Xpert Xpress SARS-CoV-2/FLU/RSV plus assay is intended as an aid in the diagnosis of influenza from Nasopharyngeal swab specimens and should not be used as a sole basis for treatment. Nasal washings and aspirates are unacceptable for Xpert Xpress SARS-CoV-2/FLU/RSV testing.  Fact Sheet for Patients: EntrepreneurPulse.com.au  Fact Sheet for Healthcare  Providers: IncredibleEmployment.be  This test is not yet approved or cleared by the [place] FDA and has been authorized for detection and/or diagnosis of SARS-CoV-2 by FDA under an Emergency Use Authorization (EUA). This EUA will remain in effect (meaning this test can be used) for the duration of the COVID-19 declaration under Section 564(b)(1) of the Act,  21 U.S.C. section 360bbb-3(b)(1), unless the authorization is terminated or revoked.  Performed at [facility], [place] [address], [place], [place] [address]   Urine rapid drug screen (hosp performed)     Status: None   Collection Time: 06/30/20 12:05 AM  Result Value Ref Range   Opiates NONE DETECTED NONE DETECTED   Cocaine NONE DETECTED NONE DETECTED   Benzodiazepines NONE DETECTED NONE DETECTED   Amphetamines NONE DETECTED NONE DETECTED   Tetrahydrocannabinol NONE DETECTED NONE DETECTED   Barbiturates NONE DETECTED NONE DETECTED    Comment: (NOTE) DRUG SCREEN FOR MEDICAL PURPOSES ONLY.  IF CONFIRMATION IS NEEDED FOR ANY PURPOSE, NOTIFY LAB WITHIN 5 DAYS.  LOWEST DETECTABLE LIMITS FOR URINE DRUG SCREEN Drug Class                     Cutoff (ng/mL) Amphetamine and metabolites    1000 Barbiturate and metabolites    200 Benzodiazepine                 009 Tricyclics and metabolites     300 Opiates and metabolites        300 Cocaine and metabolites        300 THC                            50 Performed at [facility], [place] [address], [place], [place] [address]   Protime-INR     Status: Abnormal   Collection Time: 06/30/20 12:32 AM  Result Value Ref Range   Prothrombin Time 15.6 (H) 11.4 - 15.2 seconds   INR 1.2 0.8 - 1.2    Comment: (NOTE) INR goal varies based on device and disease states. Performed at [facility], [place] [address] [place], [place] [address]   Acetaminophen level     Status: Abnormal   Collection Time: 06/30/20 12:32 AM  Result Value Ref Range    Acetaminophen (Tylenol), Serum <10 (L) 10 - 30 ug/mL    Comment: (NOTE) Therapeutic concentrations vary significantly. A range of 10-30 ug/mL  may be an effective concentration for many patients. However, some  are best treated at concentrations outside of this range. Acetaminophen concentrations >150 ug/mL at 4 hours after ingestion  and >50 ug/mL at 12 hours after ingestion are often associated with  toxic reactions.  Performed at [facility], [place] [address], [place], [place] [address]   HCV Ab Reflex to Quant PCR     Status: None   Collection Time: 06/30/20 12:32 AM  Result Value Ref Range   HCV Ab <0.1 0.0 - 0.9 s/co ratio    Comment: (NOTE) Performed At: [facility] [place], [place] [id number] Rush Farmer MD [id number]   Hepatitis B surface antigen     Status: None   Collection Time: 06/30/20 12:32 AM  Result Value Ref Range   Hepatitis B Surface Ag NON REACTIVE NON REACTIVE    Comment: Performed at [facility], [address], [place], [place] [address]  Hepatitis B surface antibody     Status: None   Collection Time: 06/30/20 12:32 AM  Result Value Ref Range   Hepatitis B-Post >1,000.0 Immunity>9.9 mIU/mL    Comment: (NOTE)  Status of Immunity                     Anti-HBs Level  ------------------                     --------------  Inconsistent with Immunity                   0.0 - 9.9 Consistent with Immunity                          >9.9 Performed At: [facility] [place], [place] [id number] Rush Farmer MD [id number]   Hepatitis C antibody     Status: None   Collection Time: 06/30/20 12:32 AM  Result Value Ref Range   HCV Ab NON REACTIVE NON REACTIVE    Comment: (NOTE) Nonreactive HCV antibody screen is consistent with no HCV infections,  unless recent infection is suspected or other evidence exists to indicate HCV infection.  Performed at [facility], [place] [address]  [place], [place] [id number]   Salicylate level     Status: Abnormal   Collection Time: 06/30/20 12:32 AM  Result Value Ref Range   Salicylate Lvl [id number] (L) 7.0 - 30.0 mg/dL    Comment: Performed at [facility] [address], [place], [place] [address]  TSH     Status: Abnormal   Collection Time: 06/30/20 12:32 AM  Result Value Ref Range   TSH 4.809 (H) 0.350 - 4.500 uIU/mL    Comment: Performed by a 3rd Generation assay with a functional sensitivity of <=0.01 uIU/mL. Performed at [facility], [place] [address] [place], [place] [address]   HIV Antibody (routine testing w rflx)     Status: None   Collection Time: 06/30/20 12:32 AM  Result Value Ref Range   HIV Screen 4th Generation wRfx Non Reactive Non Reactive    Comment: Performed at [facility], [place] [address] [place], [place] [address]  Magnesium     Status: None   Collection Time: 06/30/20 12:32 AM  Result Value Ref Range   Magnesium 2.3 1.7 - 2.4 mg/dL    Comment: Performed at [facility], [place] [address] [place], [place] [address]  Phosphorus     Status: None   Collection Time: 06/30/20 12:32 AM  Result Value Ref Range   Phosphorus 4.6 2.5 - 4.6 mg/dL    Comment: Performed at [facility], [place] [address], [place], [place] [address]  CK     Status: Abnormal   Collection Time: 06/30/20 12:32 AM  Result Value Ref Range   Total CK 16,114 (H) 38 - 234 U/L    Comment: RESULTS CONFIRMED BY MANUAL DILUTION Performed at [facility], [place] [address], [place], [place] [address]   Interpretation:     Status: None   Collection Time: 06/30/20 12:32 AM  Result Value Ref Range   HCV Interp 1: Comment     Comment: (NOTE) Negative Not infected with HCV, unless recent infection is suspected or other evidence exists to indicate HCV infection. Performed At: [facility] [place], [place] [id number] Rush Farmer MD [id number]   MRSA PCR Screening     Status: None    Collection Time: 06/30/20  2:48 AM   Specimen: Nasopharyngeal  Result Value Ref Range   MRSA by PCR NEGATIVE NEGATIVE    Comment:        The GeneXpert MRSA Assay (FDA approved for NASAL specimens only), is one component of a comprehensive MRSA colonization surveillance program. It is not intended to diagnose MRSA infection nor to guide or monitor treatment for MRSA infections. Performed at [facility], [place] [address], [place], [place] [address]   CK     Status: Abnormal   Collection Time: 06/30/20  8:21 AM  Result Value Ref Range   Total CK 11,103 (H) 38 - 234 U/L    Comment: RESULTS CONFIRMED BY MANUAL DILUTION Performed at [facility], [place] [address] [place], [place] [address]   Comprehensive metabolic panel     Status: Abnormal   Collection Time: 06/30/20  8:21 AM  Result Value Ref Range   Sodium 132 (L) 135 - 145 mmol/L   Potassium 3.9 3.5 - 5.1 mmol/L   Chloride 102 98 - 111 mmol/L   CO2 23 22 - 32 mmol/L   Glucose, Bld 74 70 - 99 mg/dL    Comment: Glucose reference range applies only to samples taken after fasting for at least 8 hours.   BUN 35 (H) 6 - 20 mg/dL   Creatinine, Ser 5.08 (H) 0.44 - 1.00 mg/dL   Calcium 8.1 (L) 8.9 - 10.3 mg/dL   Total Protein 4.8 (L) 6.5 - 8.1 g/dL   Albumin 2.5 (L) 3.5 - 5.0 g/dL   AST 719 (H) 15 - 41 U/L   ALT 987 (H) 0 - 44 U/L   Alkaline Phosphatase 69 38 - 126 U/L   Total Bilirubin 1.3 (H) 0.3 - 1.2 mg/dL   GFR, Estimated 11 (L) >60 mL/min    Comment: (NOTE) Calculated using the CKD-EPI Creatinine Equation (2021)    Anion gap 7 5 - 15    Comment: Performed at [facility] [address], [place], [place] [address]  CBC WITH DIFFERENTIAL     Status: Abnormal   Collection Time: 06/30/20  8:21 AM  Result Value Ref Range   WBC 7.0 4.0 - 10.5 K/uL   RBC 4.10 3.87 - 5.11 MIL/uL   Hemoglobin 12.3 12.0 - 15.0 g/dL   HCT 35.4 (L) 36.0 - 46.0 %   MCV 86.3 80.0 - 100.0 fL   MCH 30.0 26.0 - 34.0 pg   MCHC 34.7  30.0 - 36.0 g/dL   RDW 13.0 11.5 - 15.5 %   Platelets 156 150 - 400 K/uL   nRBC 0.0 0.0 - 0.2 %   Neutrophils Relative % 65 %   Neutro Abs 4.6 1.7 - 7.7 K/uL   Lymphocytes Relative 18 %   Lymphs Abs 1.3 0.7 - 4.0 K/uL   Monocytes Relative 11 %   Monocytes Absolute 0.8 0.1 - 1.0 K/uL   Eosinophils Relative 5 %   Eosinophils Absolute 0.4 0.0 - 0.5 K/uL   Basophils Relative 1 %   Basophils Absolute 0.1 0.0 - 0.1 K/uL   Immature Granulocytes 0 %   Abs Immature Granulocytes 0.02 0.00 - 0.07 K/uL    Comment: Performed at [facility] [address], [place], [place] [address]  Vitamin B12     Status: Abnormal   Collection Time: 06/30/20  8:21 AM  Result Value Ref Range   Vitamin B-12 1,813 (H) 180 - 914 pg/mL    Comment: (NOTE) This assay is not validated for testing neonatal or myeloproliferative syndrome specimens for Vitamin B12 levels. Performed at [facility], [place] [address], [place], [place] [address]   Folate, serum, performed at [facility] lab     Status: None   Collection Time: 06/30/20  8:21 AM  Result Value Ref Range   Folate 38.8 >5.9 ng/mL    Comment: Performed at [facility], [place] [address], [place], [place] [address]  CK     Status: Abnormal   Collection Time: 06/30/20  2:45 PM  Result Value Ref Range   Total CK 8,114 (H) 38 - 234 U/L    Comment: RESULTS CONFIRMED BY MANUAL DILUTION Performed at [facility], [place] [address], [place], [place] [address]   Phosphorus     Status: Abnormal   Collection Time: 06/30/20  2:45 PM  Result Value Ref Range   Phosphorus 5.0 (H) 2.5 - 4.6 mg/dL    Comment: Performed at [facility] [address] [place], [place] [address]  Magnesium     Status: None   Collection Time: 06/30/20  2:45 PM  Result Value Ref Range   Magnesium 2.1 1.7 - 2.4 mg/dL    Comment: Performed at [facility], [place] [address], [place], [place] [address]  Urinalysis, Complete w Microscopic Urine, Catheterized      Status: Abnormal   Collection Time: 06/30/20  2:45 PM  Result Value Ref Range   Color, Urine STRAW (A) YELLOW   APPearance CLEAR CLEAR   Specific Gravity, Urine 1.005 1.005 - 1.030   pH 6.0 5.0 - 8.0   Glucose, UA NEGATIVE NEGATIVE mg/dL   Hgb urine dipstick LARGE (A) NEGATIVE   Bilirubin Urine NEGATIVE NEGATIVE   Ketones, ur 5 (A) NEGATIVE mg/dL   Protein, ur 30 (A) NEGATIVE mg/dL   Nitrite NEGATIVE NEGATIVE   Leukocytes,Ua NEGATIVE NEGATIVE   RBC / HPF 11-20 0 - 5 RBC/hpf   WBC, UA 0-5 0 - 5 WBC/hpf   Bacteria, UA RARE (A) NONE SEEN   Squamous Epithelial / LPF 0-5 0 - 5    Comment: Performed at [facility], [place] [address], , [place] [address]  Comprehensive metabolic panel     Status: Abnormal   Collection Time: 06/30/20  2:45 PM  Result Value Ref Range   Sodium 130 (L) 135 - 145 mmol/L   Potassium 4.0 3.5 - 5.1 mmol/L   Chloride 100 98 - 111 mmol/L   CO2 21 (L) 22 - 32 mmol/L   Glucose, Bld 63 (L) 70 - 99 mg/dL    Comment: Glucose reference range applies only to samples taken after fasting for at least 8 hours.   BUN 35 (H) 6 - 20 mg/dL   Creatinine, Ser 5.36 (H) 0.44 - 1.00 mg/dL   Calcium 8.0 (L) 8.9 - 10.3 mg/dL   Total Protein 4.7 (L) 6.5 - 8.1 g/dL   Albumin 2.4 (L) 3.5 - 5.0 g/dL   AST 632 (H) 15 - 41 U/L   ALT 873 (H) 0 - 44 U/L   Alkaline Phosphatase 69 38 - 126 U/L   Total Bilirubin 1.3 (H) 0.3 - 1.2 mg/dL   GFR, Estimated 10 (L) >60 mL/min    Comment: (NOTE) Calculated using the CKD-EPI Creatinine Equation (2021)    Anion gap 9 5 - 15    Comment: Performed at [facility] [address] [place], [place] [address]  CK     Status: Abnormal   Collection Time: 06/30/20 11:00 PM  Result Value Ref Range   Total CK 6,277 (H) 38 - 234 U/L    Comment: RESULTS CONFIRMED BY MANUAL DILUTION Performed at [facility], [place] [address], [place], [place] [address]   CBC     Status: Abnormal   Collection Time: 07/01/20  5:00 AM  Result  Value  Ref Range   WBC 5.9 4.0 - 10.5 K/uL   RBC 3.86 (L) 3.87 - 5.11 MIL/uL   Hemoglobin 11.5 (L) 12.0 - 15.0 g/dL   HCT 33.7 (L) 36.0 - 46.0 %   MCV 87.3 80.0 - 100.0 fL   MCH 29.8 26.0 - 34.0 pg   MCHC 34.1 30.0 - 36.0 g/dL   RDW 13.1 11.5 - 15.5 %   Platelets 141 (L) 150 - 400 K/uL   nRBC 0.0 0.0 - 0.2 %    Comment: Performed at [facility], [place] [address] [place], [place] [address]  Comprehensive metabolic panel     Status: Abnormal   Collection Time: 07/01/20  5:00 AM  Result Value Ref Range   Sodium 132 (L) 135 - 145 mmol/L   Potassium 4.4 3.5 - 5.1 mmol/L   Chloride 102 98 - 111 mmol/L   CO2 23 22 - 32 mmol/L   Glucose, Bld 64 (L) 70 - 99 mg/dL    Comment: Glucose reference range applies only to samples taken after fasting for at least 8 hours.   BUN 33 (H) 6 - 20 mg/dL   Creatinine, Ser 5.67 (H) 0.44 - 1.00 mg/dL   Calcium 8.0 (L) 8.9 - 10.3 mg/dL   Total Protein 4.3 (L) 6.5 - 8.1 g/dL   Albumin 2.2 (L) 3.5 - 5.0 g/dL   AST 339 (H) 15 - 41 U/L   ALT 615 (H) 0 - 44 U/L   Alkaline Phosphatase 66 38 - 126 U/L   Total Bilirubin 1.2 0.3 - 1.2 mg/dL   GFR, Estimated 9 (L) >60 mL/min    Comment: (NOTE) Calculated using the CKD-EPI Creatinine Equation (2021)    Anion gap 7 5 - 15    Comment: Performed at [facility] [address] [place], [place] [address]  Magnesium     Status: None   Collection Time: 07/01/20  5:00 AM  Result Value Ref Range   Magnesium 1.8 1.7 - 2.4 mg/dL    Comment: Performed at [facility], [place] [address] [place], [place] [address]  Phosphorus     Status: Abnormal   Collection Time: 07/01/20  5:00 AM  Result Value Ref Range   Phosphorus 4.9 (H) 2.5 - 4.6 mg/dL    Comment: Performed at [facility] [address], [place], [place] [address]  CK     Status: Abnormal   Collection Time: 07/01/20  7:00 AM  Result Value Ref Range   Total CK 4,925 (H) 38 - 234 U/L    Comment: RESULTS CONFIRMED BY MANUAL DILUTION Performed at  [facility], [place] [address], [place], [place] [address]      ROS:  A comprehensive review of systems was negative except for: Musculoskeletal: positive for myalgias, muscle weakness  Physical Exam: Vitals:   07/01/20 0600 07/01/20 0751  BP: (!) 151/95 (!) 115/104  Pulse:  93  Resp: 10 12  Temp:  97.6 F (36.4 C)  SpO2: 94% 98%     General: alert, multiple piercings and tatoos, NAD- c/o pain in legs HEENT: PERRLA, EOMI, mucous membranes moist Neck: no JVD Heart: RRR Lungs: mostly clear Abdomen: soft, non tender Extremities:only peripheral LE edema at this time Skin: warm and dry Neuro: alert, some leg weakness  Assessment/Plan: 35 year old WF with AKi due to rhabdomyolysis 1.Renal- AKI in the setting of rhabdomyolysis after fall and inactivity when volume depleted.  Is non  oliguric.   CK has not decreased significantly since here but has decreased. Needs continued aggressive bicarb based IVF and will also add lasix to assist with the glomerular flushing.  There are no indications for dialysis.  Hopefully since non oliguric we will be able to continue the flushing and achieve improvement of AKI without getting to a point where volume overload limits [place] 2. Hypertension/volume  - BP up due to volume overload.  May need to deal with it in the short term 3. Anemia  - not a major issue    Louis Meckel 07/01/2020, 11:36 AM

## 2020-07-01 NOTE — Progress Notes (Signed)
Subjective:  HD2  Patient evaluated at bedside this AM. Ms. Nagorski states she is doing okay this morning but continues to be bothered by bilateral leg pain. She says her nurse told her she has been urinating a good amount. She says she spoke with SW and came up with a plan that she would go to her parent's house on discharge.   Objective:  Vital signs in last 24 hours: Vitals:   07/01/20 0350 07/01/20 0400 07/01/20 0500 07/01/20 0600  BP:  (!) 151/96 138/89 (!) 151/95  Pulse:      Resp: 16 14 13 10   Temp:      TempSrc:      SpO2: 95% 96% 96% 94%  Weight:      Height:       Physical Exam: General: Sitting in chair, no acute distress CV: Regular rate, rhythm. No m/r/g. Distal pulses 2+ bilaterally. Pulm: Normal work of breathing. Clear to auscultation bilaterally. MSK: Non-pitting edema bilateral lower extremities. Bilateral foot drop present. Neuro: Awake, alert, answering questions appropriately. Plantarflexion 4/5 bilateral feet, 3/5 dorsiflexion on L foot, 2/5 on R.    CBC Latest Ref Rng & Units 07/01/2020 06/30/2020 06/29/2020  WBC 4.0 - 10.5 K/uL 5.9 7.0 9.1  Hemoglobin 12.0 - 15.0 g/dL 11.5(L) 12.3 15.7(H)  Hematocrit 36.0 - 46.0 % 33.7(L) 35.4(L) 47.7(H)  Platelets 150 - 400 K/uL 141(L) 156 190   BMP Latest Ref Rng & Units 07/01/2020 06/30/2020 06/30/2020  Glucose 70 - 99 mg/dL 64(L) 63(L) 74  BUN 6 - 20 mg/dL 33(H) 35(H) 35(H)  Creatinine 0.44 - 1.00 mg/dL 5.67(H) 5.36(H) 5.08(H)  Sodium 135 - 145 mmol/L 132(L) 130(L) 132(L)  Potassium 3.5 - 5.1 mmol/L 4.4 4.0 3.9  Chloride 98 - 111 mmol/L 102 100 102  CO2 22 - 32 mmol/L 23 21(L) 23  Calcium 8.9 - 10.3 mg/dL 8.0(L) 8.0(L) 8.1(L)   Assessment/Plan: Sherl Yzaguirre is 35yo person living with fibromyalgia, migraines, and multiple psychiatric disorders including bipolar 1, anxiety, depression admitted 5/8 with rhabdomyolysis with acute kidney and liver injuries with worsening renal function despite IVF.  Active Problems:    Acute renal failure (HCC)  #Rhabdomyolysis #Acute renal failure CK improving, down to 4.9k this morning (26k on arrival). Overnight she received aggressive IVF at 400cc/hr. However, as of this morning had only 1200cc urine output despite >9L IVF. Although patient was dehydrated on arrival would expect increased UOP. sCr continues to worsen, 5.6<5.3. On exam today patient appears improved, states pain has been well controlled with dilaudid. Given urine output is less than expected and renal function continues to worsen consulted nephrology earlier today. Per chart review, will give IV lasix and switch to bicarb based IVF. Appreciate nephrology assistance and recommendations. - Bicarb IVF 250cc/hr per nephro - IV lasix 40mg  BID - IV dilaudid 1mg  q4h PRN - CK qh - Daily CMP - Strict I/O - PT/OT   #Acute liver injury LFT's continue to appropriately decrease with IVF, down to AST 340 / ALT 600. Work-up negative for hepatitis panels. Will continue to follow, expect continued improvement with IVF> - Daily CMP  #Bilateral foot drop Patient reports improvement with walking with PT. Appears to be slightly improved this morning on exam. Possibly nerve palsy from external compression. Will continue to monitor, consider other diagnoses of nerve involement. - PT/OT  #Malnourishment Per RD, moderate malnourishment with risk for severe. Plan to refer patient to weight management counseling through outpatient RD. Appreciate RD assistance and recommendations. - Add  Magic Cup w/ meals - Outpatient weight management counseling  #Psychiatric disorder Will need follow-up for better control of patient's multiple psychiatric disorders. Will continue with home Klonipin. - Home clonazepam 1mg  QID - Hold home aripiprazole, Ambien - Outpatient psychiatry  DIET: Regular IVF: Bicarb 250cc/hr DVT PPX: heparin BOWEL: n/a CODE: FULL FAM COM: Discussed with patient's sister, [name], yesterday. Plan to call her  again this afternoon.  Prior to Admission Living Arrangement: Home Anticipated Discharge Location: Home Barriers to Discharge: medical management Dispo: Anticipated discharge in approximately >2 day(s).   [name], MD 07/01/2020, 7:13 AM Pager: [phone] After 5pm on weekdays and 1pm on weekends: On Call pager [phone]

## 2020-07-01 NOTE — Evaluation (Signed)
Physical Therapy Evaluation Patient Details Name: Carmen Daniels MRN: [id number] DOB: 06/15/1985 Today's Date: 07/01/2020   History of Present Illness  Pt is a 35 y.o. female admitted 06/29/20 with c/o weakness and syncope; pt reports dizziness and collapse on 5/6, was down for ~8 hrs then able to crawl to bedroom and has been in bed for 2 days prior to ED admit. Workup for ARF, rhabdomyolysis. PMH includes fibromyalgia, bipolar disorder, OCD, schizophrenia, asthma.    Clinical Impression  Pt presents with an overall decrease in functional mobility secondary to above. PTA, pt independent, lives alone. Today, pt able to initiate transfer and gait training with RW, requiring up to minA for stability; pt reports limited by BLE pain. Noted BLE deficits, including generalized weakness and R foot drop (ortho tech to deliver PRAFOs). Increased time discussing d/c planning; pt plans to d/c to parents' guest home with mother available to assist as needed. Pt would benefit from continued acute PT services to maximize functional mobility and independence prior to d/c with HHPT services (may progress to OP PT).   HR 97 SpO2 96% on RA    Follow Up Recommendations Home health PT;Supervision for mobility/OOB (vs. OP PT pending progress)    Equipment Recommendations  Rolling walker with 5" wheels    Recommendations for Other Services       Precautions / Restrictions Precautions Precautions: Fall;Other (comment) Precaution Comments: R foot drop (pt reports bilateral foot in-toeing is baseline) - RN to order PRAFOs Restrictions Weight Bearing Restrictions: No      Mobility  Bed Mobility Overal bed mobility: Modified Independent Bed Mobility: Supine to Sit           General bed mobility comments: Increased time    Transfers Overall transfer level: Needs assistance Equipment used: Rolling walker (2 wheeled) Transfers: Sit to/from Stand Sit to Stand: Min assist;Min guard         General  transfer comment: cues for hand placement with RW, pt requiring intial light minA for stability upon standing from low bed height; additional sit<>stands from recliner to RW with min guard, reliant on BUE support of armrests; cues for improved eccentric control  Ambulation/Gait Ambulation/Gait assistance: Min guard;Min assist Gait Distance (Feet): 20 Feet Assistive device: Rolling walker (2 wheeled) Gait Pattern/deviations: Step-through pattern;Decreased stride length;Decreased dorsiflexion - right;Decreased dorsiflexion - left;Antalgic;Narrow base of support Gait velocity: Decreased   General Gait Details: Initial steps to recliner with RW and min guard, additional ambulation forwards/backwards with RW and min guard, 1x minA to maintain balance with walking backwards; pt with in-toeing and foot drop noted (R foot > L foot); pt self-correcting narrow BOS and able to widen stance; heavy reliance on BUE support of RW; limited by fatigue and BLE pain  Stairs            Wheelchair Mobility    Modified Rankin (Stroke Patients Only)       Balance Overall balance assessment: Needs assistance Sitting-balance support: No upper extremity supported;Feet supported Sitting balance-Leahy Scale: Fair     Standing balance support: Bilateral upper extremity supported;During functional activity;Single extremity supported Standing balance-Leahy Scale: Poor Standing balance comment: reliant on UE support                             Pertinent Vitals/Pain Pain Assessment: Faces Faces Pain Scale: Hurts little more Pain Location: Generalized, especially BLEs Pain Descriptors / Indicators: Discomfort;Burning;Tingling Pain Intervention(s): Monitored during session;Premedicated before session  Home Living Family/patient expects to be discharged to:: Private residence Living Arrangements: Alone Available Help at Discharge: Family;Available 24 hours/day Type of Home: House Home  Access: Level entry     Home Layout: One level Home Equipment: None Additional Comments: Pt lives alone in apartment; info for parents' home where pt plans to d/c to their guest house    Prior Function Level of Independence: Independent         Comments: Indep with ambulation and ADLs/iADLs, does not work. "I like puppies, swimming, and hanging out with friends"     Hand Dominance   Dominant Hand: Right    Extremity/Trunk Assessment   Upper Extremity Assessment Upper Extremity Assessment: Overall WFL for tasks assessed    Lower Extremity Assessment Lower Extremity Assessment: Generalized weakness;RLE deficits/detail;LLE deficits/detail RLE Deficits / Details: DF strength 3/5 (limited range, may be baseline?), pt endorses baseline "in-toeing" LLE Deficits / Details: DF strength 0-1/5 (pt reports not baseline), pt endorses baseline "in-toeing"    Cervical / Trunk Assessment Cervical / Trunk Assessment: Normal  Communication   Communication: No difficulties  Cognition Arousal/Alertness: Awake/alert Behavior During Therapy: Flat affect Overall Cognitive Status: Within Functional Limits for tasks assessed                                 General Comments: Cognition WFL for simple tasks, not formally assessed. Noted improved anxiety this session, although pt still endorses fearful of falling and seeking verbal encouragement with mobility. Following simple commands appropriately; did not require increased time to process this session      General Comments General comments (skin integrity, edema, etc.): HR 97, SpO2 96% on RA. Increased time discussing d/c planning - pt able to d/c to parents' home where she will have necessary assist from mother (pt recently had to do this post C-section(?) and mother able to help with all aspects of care if needed). Pt in agreement she does not require post-acute rehab at [facility], prefers to return home; interested in [place] services to  start with progression to OP PT. Ortho tech to deliver PRAFOs for foot drop    Exercises     Assessment/Plan    PT Assessment Patient needs continued PT services  PT Problem List Decreased strength;Decreased activity tolerance;Decreased balance;Decreased mobility;Decreased knowledge of use of DME;Cardiopulmonary status limiting activity;Impaired sensation;Pain       PT Treatment Interventions DME instruction;Gait training;Stair training;Functional mobility training;Therapeutic activities;Therapeutic exercise;Balance training;Patient/family education    PT Goals (Current goals can be found in the Care Plan section)  Acute Rehab PT Goals Patient Stated Goal: "Get back to walking without a walker" PT Goal Formulation: With patient Time For Goal Achievement: 07/15/20 Potential to Achieve Goals: Good    Frequency Min 3X/week   Barriers to discharge        Co-evaluation               AM-PAC PT "6 Clicks" Mobility  Outcome Measure Help needed turning from your back to your side while in a flat bed without using bedrails?: None Help needed moving from lying on your back to sitting on the side of a flat bed without using bedrails?: None Help needed moving to and from a bed to a chair (including a wheelchair)?: A Little Help needed standing up from a chair using your arms (e.g., wheelchair or bedside chair)?: A Little Help needed to walk in hospital room?: A Little Help needed climbing  3-5 steps with a railing? : A Lot 6 Click Score: 19    End of Session Equipment Utilized During Treatment: Gait belt Activity Tolerance: Patient tolerated treatment well Patient left: in chair;with call bell/phone within reach;with chair alarm set Nurse Communication: Mobility status;Other (comment) (order PRAFOs) PT Visit Diagnosis: Other abnormalities of gait and mobility (R26.89);Muscle weakness (generalized) (M62.81)    Time: 3267-1245 PT Time Calculation (min) (ACUTE ONLY): 35  min   Charges:   PT Evaluation $PT Eval Moderate Complexity: 1 Mod PT Treatments $Gait Training: 8-22 mins   Mabeline Caras, PT, DPT Acute Rehabilitation Services  Pager [phone] Office [place] 07/01/2020, 11:22 AM

## 2020-07-02 ENCOUNTER — Ambulatory Visit: Payer: Medicaid Other | Admitting: Neurology

## 2020-07-02 LAB — CBC
HCT: 34.1 % — ABNORMAL LOW (ref 36.0–46.0)
Hemoglobin: 12 g/dL (ref 12.0–15.0)
MCH: 30.2 pg (ref 26.0–34.0)
MCHC: 35.2 g/dL (ref 30.0–36.0)
MCV: 85.7 fL (ref 80.0–100.0)
Platelets: 149 10*3/uL — ABNORMAL LOW (ref 150–400)
RBC: 3.98 MIL/uL (ref 3.87–5.11)
RDW: 12.8 % (ref 11.5–15.5)
WBC: 6.9 10*3/uL (ref 4.0–10.5)
nRBC: 0 % (ref 0.0–0.2)

## 2020-07-02 LAB — MAGNESIUM: Magnesium: 1.7 mg/dL (ref 1.7–2.4)

## 2020-07-02 LAB — COMPREHENSIVE METABOLIC PANEL
ALT: 427 U/L — ABNORMAL HIGH (ref 0–44)
AST: 214 U/L — ABNORMAL HIGH (ref 15–41)
Albumin: 2.1 g/dL — ABNORMAL LOW (ref 3.5–5.0)
Alkaline Phosphatase: 67 U/L (ref 38–126)
Anion gap: 7 (ref 5–15)
BUN: 33 mg/dL — ABNORMAL HIGH (ref 6–20)
CO2: 29 mmol/L (ref 22–32)
Calcium: 7.7 mg/dL — ABNORMAL LOW (ref 8.9–10.3)
Chloride: 93 mmol/L — ABNORMAL LOW (ref 98–111)
Creatinine, Ser: 6.37 mg/dL — ABNORMAL HIGH (ref 0.44–1.00)
GFR, Estimated: 8 mL/min — ABNORMAL LOW (ref >60)
Glucose, Bld: 73 mg/dL (ref 70–99)
Potassium: 3.9 mmol/L (ref 3.5–5.1)
Sodium: 129 mmol/L — ABNORMAL LOW (ref 135–145)
Total Bilirubin: 1.4 mg/dL — ABNORMAL HIGH (ref 0.3–1.2)
Total Protein: 4.2 g/dL — ABNORMAL LOW (ref 6.5–8.1)

## 2020-07-02 LAB — CK: Total CK: 3386 U/L — ABNORMAL HIGH (ref 38–234)

## 2020-07-02 LAB — PHOSPHORUS
Phosphorus: 4.9 mg/dL — ABNORMAL HIGH (ref 2.5–4.6)
Phosphorus: 4.8 mg/dL — ABNORMAL HIGH (ref 2.5–4.6)

## 2020-07-02 LAB — SEDIMENTATION RATE: Sed Rate: 4 mm/hr (ref 0–22)

## 2020-07-02 LAB — C-REACTIVE PROTEIN: CRP: 4 mg/dL — ABNORMAL HIGH (ref <1.0)

## 2020-07-02 MED ORDER — HYDROMORPHONE HCL 2 MG PO TABS
1.0000 mg | ORAL_TABLET | Freq: Three times a day (TID) | ORAL | Status: DC
  Administered 2020-07-02 – 2020-07-03 (×3): 1 mg via ORAL
  Filled 2020-07-02 (×3): qty 1

## 2020-07-02 MED ORDER — HYDROCORTISONE 1 % EX CREA
1.0000 "application " | TOPICAL_CREAM | Freq: Three times a day (TID) | CUTANEOUS | Status: DC | PRN
  Administered 2020-07-02: 1 via TOPICAL
  Filled 2020-07-02: qty 28

## 2020-07-02 MED ORDER — HYDROMORPHONE HCL 1 MG/ML IJ SOLN
1.0000 mg | INTRAMUSCULAR | Status: DC | PRN
  Administered 2020-07-02 – 2020-07-03 (×4): 1 mg via INTRAVENOUS
  Filled 2020-07-02 (×4): qty 1

## 2020-07-02 MED ORDER — HYDROMORPHONE HCL 1 MG/ML IJ SOLN: 1.0000 mg | Freq: Four times a day (QID) | INTRAMUSCULAR | Status: DC | PRN

## 2020-07-02 NOTE — Progress Notes (Signed)
Md on call paged that sister wants MD to call tonight with updates

## 2020-07-02 NOTE — Progress Notes (Signed)
Subjective:  Just reporting pain this AM, minimal nausea and no SOB-  Had 6 liters in but 3300 out  Objective Vital signs in last 24 hours: Vitals:   07/01/20 2316 07/02/20 0300 07/02/20 0307 07/02/20 0718  BP: (!) 138/100  (!) 142/100 136/86  Pulse:    93  Resp: 12 16 14 11   Temp: 98.6 F (37 C)  98.7 F (37.1 C) 98.6 F (37 C)  TempSrc: Oral  Oral Oral  SpO2: 98% 96% 93% 95%  Weight:    104.8 kg  Height:       Weight change:   Intake/Output Summary (Last 24 hours) at 07/02/2020 0810 Last data filed at 07/02/2020 4098 Gross per 24 hour  Intake 5687.9 ml  Output 3305 ml  Net 2382.9 ml    Assessment/Plan: 35 year old WF with AKi due to rhabdomyolysis 1.Renal- AKI in the setting of rhabdomyolysis after fall and inactivity when volume depleted.  Is non oliguric.   Needs continued aggressive bicarb based IVF and also lasix to assist with the glomerular flushing.  There are no indications for dialysis.  Hopefully since non oliguric we will be able to continue the flushing and achieve improvement of AKI without getting to a point where volume overload limits [place] or that dialysis is needed.  CK continuing to come down.  crt up but no indications for HD 2. Hypertension/volume  - BP up due to volume overload.  May need to deal with it in the short term 3. Anemia  - not a major issue    Louis Meckel    Labs: Basic Metabolic Panel: Recent Labs  Lab 06/30/20 1445 07/01/20 0500 07/01/20 2055 07/02/20 0446  NA 130* 132*  --  129*  K 4.0 4.4  --  3.9  CL 100 102  --  93*  CO2 21* 23  --  29  GLUCOSE 63* 64*  --  73  BUN 35* 33*  --  33*  CREATININE 5.36* 5.67*  --  6.37*  CALCIUM 8.0* 8.0*  --  7.7*  PHOS 5.0* 4.9* 4.8* 4.9*   Liver Function Tests: Recent Labs  Lab 06/30/20 1445 07/01/20 0500 07/02/20 0446  AST 632* 339* 214*  ALT 873* 615* 427*  ALKPHOS 69 66 67  BILITOT 1.3* 1.2 1.4*  PROT 4.7* 4.3* 4.2*  ALBUMIN 2.4* 2.2* 2.1*   No results for input(s):  LIPASE, AMYLASE in the last 168 hours. No results for input(s): AMMONIA in the last 168 hours. CBC: Recent Labs  Lab 06/29/20 2014 06/30/20 0821 07/01/20 0500 07/02/20 0446  WBC 9.1 7.0 5.9 6.9  NEUTROABS  --  4.6  --   --   HGB 15.7* 12.3 11.5* 12.0  HCT 47.7* 35.4* 33.7* 34.1*  MCV 88.8 86.3 87.3 85.7  PLT 190 156 141* 149*   Cardiac Enzymes: Recent Labs  Lab 06/30/20 0821 06/30/20 1445 06/30/20 2300 07/01/20 0700 07/02/20 0446  CKTOTAL 11,103* 8,114* 6,277* 4,925* 3,386*   CBG: Recent Labs  Lab 06/29/20 1914  GLUCAP 76    Iron Studies: No results for input(s): IRON, TIBC, TRANSFERRIN, FERRITIN in the last 72 hours. Studies/Results: No results found. Medications: Infusions: .  sodium bicarbonate (isotonic) infusion in sterile water 250 mL/hr at 07/02/20 0129    Scheduled Medications: . Chlorhexidine Gluconate Cloth  6 each Topical Daily  . clonazePAM  1 mg Oral QID  . dextrose  12.5 g Intravenous STAT  . furosemide  40 mg Intravenous Q12H  .  heparin  5,000 Units Subcutaneous Q8H  . multivitamin with minerals  1 tablet Oral Daily  . nicotine  14 mg Transdermal Daily  . sodium chloride flush  10-40 mL Intracatheter Q12H    have reviewed scheduled and prn medications.  Physical Exam: General: NAD Heart: RRR Lungs: mostly clear Abdomen: soft, non tender Extremities: some edema    07/02/2020,8:10 AM  LOS: 2 days

## 2020-07-02 NOTE — Progress Notes (Signed)
Subjective:  HD3  Carmen Daniels states she is doing "alright" this morning. She says she has been drinking well but "cannot eat hospital food". She was nauseas last night although this improved with Zofran. She attributes this to her anxiety. She says her legs continue to be painful and swollen and feel heavy. She feels her left leg is more swollen than the right. She says despite the pain medications she is on, she continues to "feel everything". She says nephrology told her yesterday that she will not likely be needing dialysis. She says PT said she was making a lot of progress yesterday and she is excited that she can move her feet much better today. She says that PTA she had not been taking any medications for weight loss, solely restrictive eating.   Objective:  Vital signs in last 24 hours: Vitals:   07/01/20 2300 07/01/20 2316 07/02/20 0300 07/02/20 0307  BP:  (!) 138/100  (!) 142/100  Pulse:      Resp: _0 Temp:  98.6 F (37 C)  98.7 F (37.1 C)  TempSrc:  Oral  Oral  SpO2: 98% 98% 96% 93%  Weight:      Height:       Physical Exam: General: Laying in bed, no acute distress CV: Regular rate, rhythm. No m/r/g. Distal pulses 2+ bilaterally Pulm: Normal work of breathing, clear to auscultation bilaterally MSK: Non-pitting edema bilaterally, L>R Neuro: Awake, alert, answering questions appropriately. Plantar flexion 4/5 bilaterally, dorsiflexion 3/5 on dorsiflexion bilaterally  CBC Latest Ref Rng & Units 07/02/2020 07/01/2020 06/30/2020  WBC 4.0 - 10.5 K/uL 6.9 5.9 7.0  Hemoglobin 12.0 - 15.0 g/dL 12.0 11.5(L) 12.3  Hematocrit 36.0 - 46.0 % 34.1(L) 33.7(L) 35.4(L)  Platelets 150 - 400 K/uL 149(L) 141(L) 156   BMP Latest Ref Rng & Units 07/02/2020 07/01/2020 06/30/2020  Glucose 70 - 99 mg/dL 73 64(L) 63(L)  BUN 6 - 20 mg/dL 33(H) 33(H) 35(H)  Creatinine 0.44 - 1.00 mg/dL 6.37(H) 5.67(H) 5.36(H)  Sodium 135 - 145 mmol/L 129(L) 132(L) 130(L)  Potassium 3.5 - 5.1 mmol/L 3.9 4.4  4.0  Chloride 98 - 111 mmol/L 93(L) 102 100  CO2 22 - 32 mmol/L 29 23 21(L)  Calcium 8.9 - 10.3 mg/dL 7.7(L) 8.0(L) 8.0(L)   Assessment/Plan: [name] is 35yo person living with fibromyalgia, migraines, and multiple psychiatric disorders including bipolar 1 disorder, anxiety, depression admitted 5/8 with rhabdomyolysis with acute kidney and liver injuries with improved urine output over the last 24h.  Active Problems:   Acute renal failure (HCC)   Malnutrition of moderate degree  #Rhabdomyolysis #Acute renal failure UOP improved over last 24h, up to 3.6L. sCr increased 6.37<5.67 with IVF. Overall patient appears clinically improved, although still having some pain. Per nephrology, will continue with alkaline diuresis. Will obtain r/p UA to further evaluate for casts. Since she also has bilateral foot drop, will obtain ANA, ANCA, inflammatory markers for evaluation for vasculitis, although less likely. Appreciate nephrology recommendations. Limited pain options given renal and hepatic impairment. - C/w bicarb IVF 250cc/hr - Lasix 22m BID - F/u UA, ANA, ANCA, CRP, ESR - po dilaudid 12mq8h - IV dilaudid 16m26m4h PRN breakthrough - Daily CMP - Strict I/O - PT/OT   #Acute liver injury LFT's continue to improve. No abdominal pain on exam. Expect continued normalization with fluids. - Daily CMP  #Bilateral foot drop Appears to be improving daily with physical therapy. Still unclear etiology, will work-up for vasculitis  given acute renal failure and polyneuropathy. Most likely 2/2 nerve palsy from external compression. - PT/OT  #Moderate malnourishment Appreciate RD assistance. Patient to follow-up with outpatient weight management counseling. - Magic Cup TID per RD - Outpatient weight management counseling  #Psychiatric disorder Will continue home Klonipin, will also need outpatient psychiatry follow-up. - Home clonazepam 8m QID - Outpatient psychiatry  DIET: Regular IVF:  Bicarb 250cc/hr DVT PPX: heparin BOWEL: n/a CODE: FULL FAM COM: Discussed with patient's sister, JEvelena Peatyesterday. Will call and update her again today.  Prior to Admission Living Arrangement: Home Anticipated Discharge Location: Home per PT/OT Barriers to Discharge: medical management Dispo: Anticipated discharge in approximately >2 day(s).   BSanjuan Dame MD 07/02/2020, 5:52 AM Pager: [phone] 5pm on weekdays and 1pm on weekends: On Call pager [phone]

## 2020-07-02 NOTE — Progress Notes (Signed)
Physical Therapy Treatment Patient Details Name: Carmen Daniels MRN: [id number] DOB: 1985/06/12 Today's Date: 07/02/2020    History of Present Illness Pt is a 35 y.o. female admitted 06/29/20 with c/o weakness and syncope; pt reports dizziness and collapse on 5/6, was down for ~8 hrs then able to crawl to bedroom and has been in bed for 2 days prior to ED admit. Workup for ARF, rhabdomyolysis. PMH includes fibromyalgia, bipolar disorder, OCD, schizophrenia, asthma.    PT Comments    Pt progressing towards goals, and able to increase ambulation distance this session. Continues to be limited secondary to BLE pain. Trialed using rollator this session as pt fatigues easily, however, pt reports feeling more comfortable with RW. Required min guard to min A for steadying. Would benefit from chair follow during next session to progress mobility. Current recommendations appropriate. Will continue to follow acutely.    Follow Up Recommendations  Home health PT;Supervision for mobility/OOB     Equipment Recommendations  Rolling walker with 5" wheels    Recommendations for Other Services       Precautions / Restrictions Precautions Precautions: Fall;Other (comment) Precaution Comments: bilateral foot drop (pt reports bilateral foot in-toeing is baseline) Restrictions Weight Bearing Restrictions: No    Mobility  Bed Mobility Overal bed mobility: Modified Independent                  Transfers Overall transfer level: Needs assistance Equipment used: 4-wheeled walker Transfers: Sit to/from Stand Sit to Stand: Min guard         General transfer comment: Min guard for safety.  Ambulation/Gait Ambulation/Gait assistance: Min guard;Min assist Gait Distance (Feet): 35 Feet Assistive device: 4-wheeled walker Gait Pattern/deviations: Step-through pattern;Decreased stride length;Decreased dorsiflexion - right;Decreased dorsiflexion - left;Antalgic;Narrow base of support Gait velocity:  Decreased   General Gait Details: Practiced using rollator this session as pt requiring seated rest, but pt reports feeling more comfortable with RW. Min guard to min A for steadying and did not require rest breaks this session. Continued to present with bilateral foot drop, but had improved from previous session. Limited secondary to BLE pain.   Stairs             Wheelchair Mobility    Modified Rankin (Stroke Patients Only)       Balance Overall balance assessment: Needs assistance Sitting-balance support: No upper extremity supported;Feet supported Sitting balance-Leahy Scale: Fair     Standing balance support: Bilateral upper extremity supported;During functional activity;Single extremity supported Standing balance-Leahy Scale: Poor Standing balance comment: reliant on UE support                            Cognition Arousal/Alertness: Awake/alert Behavior During Therapy: Flat affect Overall Cognitive Status: No family/caregiver present to determine baseline cognitive functioning                                 General Comments: Pt with less anxiety this session. Continues to benefit from verbal encouragement during mobility.      Exercises      General Comments General comments (skin integrity, edema, etc.): Pt reporting increased ankle DF bilaterally from previous session.      Pertinent Vitals/Pain Pain Assessment: Faces Faces Pain Scale: Hurts even more Pain Location: BLE Pain Descriptors / Indicators: Cramping;Grimacing;Guarding Pain Intervention(s): Limited activity within patient's tolerance;Monitored during session;Repositioned    Home Living  Prior Function            PT Goals (current goals can now be found in the care plan section) Acute Rehab PT Goals Patient Stated Goal: for pain to decrease PT Goal Formulation: With patient Time For Goal Achievement: 07/15/20 Potential to Achieve  Goals: Good Progress towards PT goals: Progressing toward goals    Frequency    Min 3X/week      PT Plan Current plan remains appropriate    Co-evaluation              AM-PAC PT "6 Clicks" Mobility   Outcome Measure  Help needed turning from your back to your side while in a flat bed without using bedrails?: None Help needed moving from lying on your back to sitting on the side of a flat bed without using bedrails?: None Help needed moving to and from a bed to a chair (including a wheelchair)?: A Little Help needed standing up from a chair using your arms (e.g., wheelchair or bedside chair)?: A Little Help needed to walk in hospital room?: A Little Help needed climbing 3-5 steps with a railing? : A Lot 6 Click Score: 19    End of Session Equipment Utilized During Treatment: Gait belt Activity Tolerance: Patient limited by pain Patient left: in bed;with call bell/phone within reach Nurse Communication: Mobility status PT Visit Diagnosis: Other abnormalities of gait and mobility (R26.89);Muscle weakness (generalized) (M62.81)     Time: 5284-1324 PT Time Calculation (min) (ACUTE ONLY): 18 min  Charges:  $Gait Training: 8-22 mins                     Carmen Daniels, PT, DPT  Acute Rehabilitation Services  Pager: [phone] Office: [phone]    Carmen Daniels 07/02/2020, 5:07 PM

## 2020-07-03 ENCOUNTER — Inpatient Hospital Stay (HOSPITAL_COMMUNITY): Payer: Medicaid Other

## 2020-07-03 DIAGNOSIS — R609 Edema, unspecified: Secondary | ICD-10-CM

## 2020-07-03 LAB — URINALYSIS, COMPLETE (UACMP) WITH MICROSCOPIC
Bilirubin Urine: NEGATIVE
Glucose, UA: NEGATIVE mg/dL
Ketones, ur: 5 mg/dL — AB
Leukocytes,Ua: NEGATIVE
Nitrite: NEGATIVE
Protein, ur: NEGATIVE mg/dL
Specific Gravity, Urine: 1.004 — ABNORMAL LOW (ref 1.005–1.030)
pH: 9 — ABNORMAL HIGH (ref 5.0–8.0)

## 2020-07-03 LAB — COMPREHENSIVE METABOLIC PANEL
ALT: 285 U/L — ABNORMAL HIGH (ref 0–44)
AST: 140 U/L — ABNORMAL HIGH (ref 15–41)
Albumin: 2.1 g/dL — ABNORMAL LOW (ref 3.5–5.0)
Alkaline Phosphatase: 58 U/L (ref 38–126)
Anion gap: 10 (ref 5–15)
BUN: 34 mg/dL — ABNORMAL HIGH (ref 6–20)
CO2: 37 mmol/L — ABNORMAL HIGH (ref 22–32)
Calcium: 7.5 mg/dL — ABNORMAL LOW (ref 8.9–10.3)
Chloride: 85 mmol/L — ABNORMAL LOW (ref 98–111)
Creatinine, Ser: 7.44 mg/dL — ABNORMAL HIGH (ref 0.44–1.00)
GFR, Estimated: 7 mL/min — ABNORMAL LOW (ref >60)
Glucose, Bld: 78 mg/dL (ref 70–99)
Potassium: 3.6 mmol/L (ref 3.5–5.1)
Sodium: 132 mmol/L — ABNORMAL LOW (ref 135–145)
Total Bilirubin: 1.4 mg/dL — ABNORMAL HIGH (ref 0.3–1.2)
Total Protein: 4.1 g/dL — ABNORMAL LOW (ref 6.5–8.1)

## 2020-07-03 LAB — CBC
HCT: 34.5 % — ABNORMAL LOW (ref 36.0–46.0)
Hemoglobin: 12 g/dL (ref 12.0–15.0)
MCH: 29.5 pg (ref 26.0–34.0)
MCHC: 34.8 g/dL (ref 30.0–36.0)
MCV: 84.8 fL (ref 80.0–100.0)
Platelets: 137 10*3/uL — ABNORMAL LOW (ref 150–400)
RBC: 4.07 MIL/uL (ref 3.87–5.11)
RDW: 12.9 % (ref 11.5–15.5)
WBC: 6.5 10*3/uL (ref 4.0–10.5)
nRBC: 0 % (ref 0.0–0.2)

## 2020-07-03 LAB — PHOSPHORUS: Phosphorus: 5.4 mg/dL — ABNORMAL HIGH (ref 2.5–4.6)

## 2020-07-03 LAB — CK: Total CK: 2203 U/L — ABNORMAL HIGH (ref 38–234)

## 2020-07-03 LAB — MAGNESIUM: Magnesium: 1.7 mg/dL (ref 1.7–2.4)

## 2020-07-03 MED ORDER — HYDROMORPHONE HCL 2 MG PO TABS
2.0000 mg | ORAL_TABLET | Freq: Three times a day (TID) | ORAL | Status: DC
  Administered 2020-07-03 – 2020-07-08 (×15): 2 mg via ORAL
  Filled 2020-07-03 (×15): qty 1

## 2020-07-03 MED ORDER — SUMATRIPTAN SUCCINATE 100 MG PO TABS
100.0000 mg | ORAL_TABLET | ORAL | Status: DC | PRN
  Administered 2020-07-03 – 2020-07-05 (×3): 100 mg via ORAL
  Filled 2020-07-03 (×4): qty 1

## 2020-07-03 MED ORDER — HYDROMORPHONE HCL 1 MG/ML IJ SOLN
0.5000 mg | Freq: Once | INTRAMUSCULAR | Status: AC
  Administered 2020-07-03: 0.5 mg via INTRAVENOUS
  Filled 2020-07-03: qty 1

## 2020-07-03 MED ORDER — HYDROMORPHONE HCL 1 MG/ML IJ SOLN
1.0000 mg | INTRAMUSCULAR | Status: DC | PRN
Start: 2020-07-03 — End: 2020-07-06
  Administered 2020-07-03 – 2020-07-06 (×18): 1 mg via INTRAVENOUS
  Filled 2020-07-03 (×18): qty 1

## 2020-07-03 MED ORDER — CYCLOBENZAPRINE HCL 5 MG PO TABS
5.0000 mg | ORAL_TABLET | Freq: Three times a day (TID) | ORAL | Status: DC | PRN
  Administered 2020-07-03 – 2020-07-07 (×3): 5 mg via ORAL
  Filled 2020-07-03 (×4): qty 1

## 2020-07-03 NOTE — Progress Notes (Signed)
Call to lab regarding consult for DBIV. Requested Mg be completed for CMP tube drawn earlier today.

## 2020-07-03 NOTE — Plan of Care (Signed)

## 2020-07-03 NOTE — Progress Notes (Signed)
Patient's sister Milas Hock has requested to speak with the MD regarding the plan of care, MD has been notified and RN will continue to monitor this patient.

## 2020-07-03 NOTE — Progress Notes (Signed)
Occupational Therapy Treatment Patient Details Name: Carmen Daniels MRN: [id number] DOB: [birth year] Today's Date: 07/03/2020    History of present illness 35 y.o. female admitted 06/29/20 with c/o weakness and syncope; pt reports dizziness and collapse on 5/6, was down for ~8 hrs then able to crawl to bedroom and has been in bed for 2 days prior to ED admit. Workup for ARF, rhabdomyolysis. PMH includes fibromyalgia, bipolar disorder, OCD, schizophrenia, asthma.   OT comments  Pt continues to present with decreased balance, strength, and activity tolerance. Pt performing sit<>stand from EOB x2 and then side steps towards HOB with Min A. Pt reporting increased pain and dizziness. BP: sitting at EOB after first sit<>stand 140/87 (103) and supine at end 153/95 (108). Pt wanting to dc to home with parents and will need HHOT. Continue to feel with pt's activity tolerance, balance, and strength, pt would benefit from post-acute rehab. Will continue to follow acutely as admitted.     Follow Up Recommendations  Home health OT (Pt wanting to go home with parents; however, continue to feel post-acute rehab would be benefiticial.)    Equipment Recommendations  3 in 1 bedside commode    Recommendations for Other Services PT consult    Precautions / Restrictions Precautions Precautions: Fall;Other (comment) Precaution Comments: bilateral foot drop (pt reports bilateral foot in-toeing is baseline) Required Braces or Orthoses: Other Brace Other Brace: PRAFO bilateral       Mobility Bed Mobility Overal bed mobility: Modified Independent;Needs Assistance;Independent Bed Mobility: Supine to Sit;Sit to Supine     Supine to sit: Supervision Sit to supine: Supervision   General bed mobility comments: Increased time    Transfers Overall transfer level: Needs assistance Equipment used: 4-wheeled walker Transfers: Sit to/from Stand Sit to Stand: Min guard         General transfer comment: Min  guard for safety.    Balance Overall balance assessment: Needs assistance Sitting-balance support: No upper extremity supported;Feet supported Sitting balance-Leahy Scale: Fair     Standing balance support: Bilateral upper extremity supported;During functional activity;Single extremity supported Standing balance-Leahy Scale: Poor Standing balance comment: reliant on UE support                           ADL either performed or assessed with clinical judgement   ADL Overall ADL's : Needs assistance/impaired                     Lower Body Dressing: Maximal assistance;Sit to/from stand Lower Body Dressing Details (indicate cue type and reason): max a to don prafos Toilet Transfer: Min guard;RW;Minimal assistance Toilet Transfer Details (indicate cue type and reason): Sit<>stand at EOB. Min guard A for safety. Poor balance and requiring Min A for gaining static standing balance         Functional mobility during ADLs: Minimal assistance;Rolling walker (side steps towards hob) General ADL Comments: Decreased balance, strength, and cognition. More painful this session     Vision       Perception     Praxis      Cognition Arousal/Alertness: Awake/alert Behavior During Therapy: Flat affect Overall Cognitive Status: No family/caregiver present to determine baseline cognitive functioning Area of Impairment: Following commands;Problem solving;Awareness;Safety/judgement;Attention                   Current Attention Level: Sustained   Following Commands: Follows one step commands with increased time Safety/Judgement: Decreased awareness of safety;Decreased awareness of  deficits Awareness: Intellectual Problem Solving: Slow processing;Requires verbal cues General Comments: More fatigued and focused on pain this session. Requiring increased encouragment. decreased processing, problem solving, and awareness.        Exercises     Shoulder Instructions        General Comments Mom and dad present. Pt reporting dizziness: BP sitting at EOB after first sit<>stand 140/87 (103) and supine at end 153/95 (108)    Pertinent Vitals/ Pain       Pain Assessment: Faces Faces Pain Scale: Hurts even more Pain Location: migraine Pain Descriptors / Indicators: Grimacing;Guarding Pain Intervention(s): Monitored during session  Home Living                                          Prior Functioning/Environment              Frequency  Min 2X/week        Progress Toward Goals  OT Goals(current goals can now be found in the care plan section)  Progress towards OT goals: Progressing toward goals  Acute Rehab OT Goals Patient Stated Goal: for pain to decrease OT Goal Formulation: With patient Time For Goal Achievement: 07/14/20 Potential to Achieve Goals: Good ADL Goals Pt Will Perform Grooming: with min assist;standing Pt Will Perform Lower Body Dressing: with min guard assist;sit to/from stand Pt Will Transfer to Toilet: with min guard assist;ambulating;bedside commode Pt Will Perform Toileting - Clothing Manipulation and hygiene: with supervision;sit to/from stand;sitting/lateral leans  Plan Discharge plan needs to be updated    Co-evaluation                 AM-PAC OT "6 Clicks" Daily Activity     Outcome Measure   Help from another person eating meals?: None Help from another person taking care of personal grooming?: A Little Help from another person toileting, which includes using toliet, bedpan, or urinal?: A Lot Help from another person bathing (including washing, rinsing, drying)?: A Lot Help from another person to put on and taking off regular upper body clothing?: A Little Help from another person to put on and taking off regular lower body clothing?: A Lot 6 Click Score: 16    End of Session Equipment Utilized During Treatment: Gait belt;Rolling walker;Other (comment) (PRAFOs)  OT Visit  Diagnosis: Unsteadiness on feet (R26.81);Other abnormalities of gait and mobility (R26.89);Muscle weakness (generalized) (M62.81)   Activity Tolerance Patient tolerated treatment well   Patient Left in bed;with call bell/phone within reach;with family/visitor present   Nurse Communication Mobility status        Time: 1610-9604 OT Time Calculation (min): 25 min  Charges: OT General Charges $OT Visit: 1 Visit OT Treatments $Therapeutic Activity: 23-37 mins  [place], OTR/L Acute Rehab Pager: [phone] Office: [place] 07/03/2020, 4:53 PM

## 2020-07-03 NOTE — Progress Notes (Signed)
Patient wore her boots from 2200 to about 0400 she stated that her legs began to hurt so they were removed by her request. Arthor Captain LPN

## 2020-07-03 NOTE — Progress Notes (Signed)
Subjective:  HD4  Carmen Daniels states that her leg pain is significantly worse today than yesterday. Her legs feel tight. She feels that the side of her face is numb, has not had this before. She says the kidney doctor had not talked to her. She is tearful and says she is in a lot of pain.   Objective:  Vital signs in last 24 hours: Vitals:   07/02/20 0718 07/02/20 1144 07/02/20 2015 07/03/20 0552  BP: 136/86 (!) 132/99 (!) 134/100 (!) 142/96  Pulse: 93 92 100 94  Resp: 11 18 18 18   Temp: 98.6 F (37 C) 98.4 F (36.9 C) 99 F (37.2 C) 99.3 F (37.4 C)  TempSrc: Oral Oral Oral Oral  SpO2: 95% 99% 98% 96%  Weight: 104.8 kg     Height:       Physical Exam: General: Laying in bed, tearful CV: Regular rate, rhythm. No m/r/g appreciated. Pulm: Normal work of breathing, clear to auscultation bilaterally MSK: Non-pitting edema bilaterally, L>R. L leg swollen, more tense than yesterday. Neuro: Awake, alert, answering questions appropriately.  CBC Latest Ref Rng & Units 07/03/2020 07/02/2020 07/01/2020  WBC 4.0 - 10.5 K/uL 6.5 6.9 5.9  Hemoglobin 12.0 - 15.0 g/dL 12.0 12.0 11.5(L)  Hematocrit 36.0 - 46.0 % 34.5(L) 34.1(L) 33.7(L)  Platelets 150 - 400 K/uL 137(L) 149(L) 141(L)   BMP Latest Ref Rng & Units 07/03/2020 07/02/2020 07/01/2020  Glucose 70 - 99 mg/dL 78 73 64(L)  BUN 6 - 20 mg/dL 34(H) 33(H) 33(H)  Creatinine 0.44 - 1.00 mg/dL 7.44(H) 6.37(H) 5.67(H)  Sodium 135 - 145 mmol/L 132(L) 129(L) 132(L)  Potassium 3.5 - 5.1 mmol/L 3.6 3.9 4.4  Chloride 98 - 111 mmol/L 85(L) 93(L) 102  CO2 22 - 32 mmol/L 37(H) 29 23  Calcium 8.9 - 10.3 mg/dL 7.5(L) 7.7(L) 8.0(L)   Assessment/Plan: Carmen Daniels is 35yo person living with fibromyalgia, migraines, and multiple psychiatric disorders including bipolar 1 disorder, anxiety, depression admitted 5/8 with rhabdomyolysis with acute kidney and liver injuries, continues to have worsening renal function although non-oliguric.   Active Problems:    Acute renal failure (HCC)   Malnutrition of moderate degree  #Rhabdomyolysis #Acute renal failure sCr 6.3>7.4, GFR 8>7, UOP similar to previous day, 3.3L. Overall looks hypervolemic on exam, having extreme pain in her bilateral legs. L leg is more tense, larger circumference than R. Will plan on LLE ultrasound to ensure no DVT, although likely fluid. R/p UA yesterday without casts. Still pending ANA, ANCA. Concern if kidneys do not improve soon will be limited by volume status given she is markedly net positive fluids since admission. Appreciate nephrology recommendations. Will discuss with them regarding switching to isotonic fluids given increase in bicarbonate on lab work this morning. Plan to increase pain medications and start flexiril for muscle cramps.  - Fluids per nephrology - Lasix 40mg  BID - po dilaudid 2mg  q8h - IV dilaudid 1mg  q2h PRN breakthrough - Flexiril 5mg  TID PRN for cramps - F/u ANA, ANCA - F/u LLE ultrasound - Daily CMP - Strict I/O - PT/OT   #Acute liver injury LFT's continue to improve with fluids. Expect normalization with continued fluids. - Daily CMP  #Bilateral foot drop Patient continues to work with physical therapy, appears to be improving daily. Will continue with vasculitis work-up to ensure no other etiology. Most likely due to nerve palsy from external compression. - PT/OT  #Moderate malnourishment Appreciate RD assistance. Will follow-up with outpatient weight management counseling. - Outpatient weight  management counseling  #Psychiatric disorder Will continue home clonazepam and needs outpatient psychiatry follow-up. - Home Klonipin 1mg  QID - Outpatient psychiatry  DIET: Regular IVF: Bicarb DVT PPX: heparin BOWEL: n/a CODE: FULL FAM COM: O/N team discussed with patient's sister, will call her this afternoon.  Prior to Admission Living Arrangement: Home Anticipated Discharge Location: Home Barriers to Discharge: medical  management Dispo: Anticipated discharge in approximately >2 day(s).   [name], MD 07/03/2020, 7:41 AM Pager: [phone] After 5pm on weekdays and 1pm on weekends: On Call pager [phone]

## 2020-07-03 NOTE — Progress Notes (Signed)
LLE venous duplex has been completed.  Preliminary results given to Joseph Art, RN at [id number].  Results can be found under chart review under CV PROC. 07/03/2020 12:21 PM Nastashia Gallo RVT, RDMS

## 2020-07-03 NOTE — Progress Notes (Addendum)
PT Cancellation Note  Patient Details Name: Carmen Daniels MRN: [id number] DOB: 08/09/1985   Cancelled Treatment:    Reason Eval/Treat Not Completed: Patient at procedure or test/unavailable. Pt at vascular lab. Will check back later time permitting  Attempted 2 additional times. Pt with migraine and sleeping refusing PT  Lyanne Co, DPT Acute Rehabilitation Services [phone]    [place] 07/03/2020, 12:05 PM   Lyanne Co, DPT Acute Rehabilitation Services [phone]

## 2020-07-03 NOTE — Progress Notes (Addendum)
Subjective: Patient reports normal respiratory status. Reports bilateral leg pain and expecting ultrasound of LLE. Also reports nausea with migraine this AM.   Objective Vital signs in last 24 hours: Vitals:   07/02/20 1144 07/02/20 2015 07/03/20 0552 07/03/20 1020  BP: (!) 132/99 (!) 134/100 (!) 142/96 127/88  Pulse: 92 100 94 99  Resp: 18 18 18 17   Temp: 98.4 F (36.9 C) 99 F (37.2 C) 99.3 F (37.4 C) 98.8 F (37.1 C)  TempSrc: Oral Oral Oral Oral  SpO2: 99% 98% 96% 100%  Weight:      Height:       Weight change:   Intake/Output Summary (Last 24 hours) at 07/03/2020 1152 Last data filed at 07/03/2020 0900 Gross per 24 hour  Intake 120 ml  Output 3350 ml  Net -3230 ml    Assessment/Plan: 35 year old WF with AKi due to rhabdomyolysis  1.Renal- AKI in the setting of rhabdomyolysis after fall and inactivity when volume depleted.  Is continuing to have adequate diuresis.  -continue  bicarb based IVF, monitoring volume status (some LE edema noted, normal pulmonary exam) and also lasix to assist with the glomerular flushing.   -remains with no indications for dialysis at this time.  - CK continuing to improve on current therapy, down from >3K to 2300 today.   -creatinine continues to trend upward, expect this will improve within next week, will continue to trend, IVFs and lasix   2. Hypertension/volume  - BP remains elevated, will monitor volume status,  3. Anemia  - Hgb stable at 12, will monitor    Carmen Daniels   Patient seen and examined, agree with above note with above modifications. CK continues to decrease.  We are accomplishing what we want to accomplish-  Glomerular flushing to abate injury caused by the heme pigment with rhabdo.  No acute indications for dialysis   Hoping to see plateau of renal function soon -  Continue current regimen Corliss Parish, MD 07/03/2020      Labs: Basic Metabolic Panel: Recent Labs  Lab 07/01/20 0500  07/01/20 2055 07/02/20 0446 07/02/20 1700 07/03/20 0340  NA 132*  --  129*  --  132*  K 4.4  --  3.9  --  3.6  CL 102  --  93*  --  85*  CO2 23  --  29  --  37*  GLUCOSE 64*  --  73  --  78  BUN 33*  --  33*  --  34*  CREATININE 5.67*  --  6.37*  --  7.44*  CALCIUM 8.0*  --  7.7*  --  7.5*  PHOS 4.9*   < > 4.9* 4.8* 5.4*   < > = values in this interval not displayed.   Liver Function Tests: Recent Labs  Lab 07/01/20 0500 07/02/20 0446 07/03/20 0340  AST 339* 214* 140*  ALT 615* 427* 285*  ALKPHOS 66 67 58  BILITOT 1.2 1.4* 1.4*  PROT 4.3* 4.2* 4.1*  ALBUMIN 2.2* 2.1* 2.1*   No results for input(s): LIPASE, AMYLASE in the last 168 hours. No results for input(s): AMMONIA in the last 168 hours. CBC: Recent Labs  Lab 06/29/20 2014 06/30/20 0821 07/01/20 0500 07/02/20 0446 07/03/20 0340  WBC 9.1 7.0 5.9 6.9 6.5  NEUTROABS  --  4.6  --   --   --   HGB 15.7* 12.3 11.5* 12.0 12.0  HCT 47.7* 35.4* 33.7* 34.1* 34.5*  MCV 88.8 86.3 87.3 85.7 84.8  PLT 190 156 141* 149* 137*   Cardiac Enzymes: Recent Labs  Lab 06/30/20 1445 06/30/20 2300 07/01/20 0700 07/02/20 0446 07/03/20 0340  CKTOTAL 8,114* 6,277* 4,925* 3,386* 2,203*   CBG: Recent Labs  Lab 06/29/20 1914  GLUCAP 76    Iron Studies: No results for input(s): IRON, TIBC, TRANSFERRIN, FERRITIN in the last 72 hours. Studies/Results: No results found. Medications: Infusions: .  sodium bicarbonate (isotonic) infusion in sterile water 250 mL/hr at 07/03/20 0347    Scheduled Medications: . Chlorhexidine Gluconate Cloth  6 each Topical Daily  . clonazePAM  1 mg Oral QID  . furosemide  40 mg Intravenous Q12H  . heparin  5,000 Units Subcutaneous Q8H  .  HYDROmorphone (DILAUDID) injection  0.5 mg Intravenous Once  . HYDROmorphone  2 mg Oral Q8H  . multivitamin with minerals  1 tablet Oral Daily  . nicotine  14 mg Transdermal Daily  . sodium chloride flush  10-40 mL Intracatheter Q12H    have reviewed  scheduled and prn medications.  Physical Exam: General: NAD, lying in darkened room with eyes closed, reporting HA  Heart: RRR Lungs: CTAB, no wheezing or crackles noted, patient with normal WOB  Abdomen: soft, non tender, nondistended  Extremities: LE edema, tender to palpation of bilateral LEs     07/03/2020,11:52 AM  LOS: 3 days

## 2020-07-04 DIAGNOSIS — E876 Hypokalemia: Secondary | ICD-10-CM

## 2020-07-04 DIAGNOSIS — M21372 Foot drop, left foot: Secondary | ICD-10-CM

## 2020-07-04 DIAGNOSIS — M21371 Foot drop, right foot: Secondary | ICD-10-CM

## 2020-07-04 DIAGNOSIS — K5900 Constipation, unspecified: Secondary | ICD-10-CM

## 2020-07-04 LAB — CBC
HCT: 33.5 % — ABNORMAL LOW (ref 36.0–46.0)
Hemoglobin: 11.5 g/dL — ABNORMAL LOW (ref 12.0–15.0)
MCH: 29.5 pg (ref 26.0–34.0)
MCHC: 34.3 g/dL (ref 30.0–36.0)
MCV: 85.9 fL (ref 80.0–100.0)
Platelets: 125 10*3/uL — ABNORMAL LOW (ref 150–400)
RBC: 3.9 MIL/uL (ref 3.87–5.11)
RDW: 12.9 % (ref 11.5–15.5)
WBC: 6.8 10*3/uL (ref 4.0–10.5)
nRBC: 0 % (ref 0.0–0.2)

## 2020-07-04 LAB — COMPREHENSIVE METABOLIC PANEL
ALT: 198 U/L — ABNORMAL HIGH (ref 0–44)
AST: 100 U/L — ABNORMAL HIGH (ref 15–41)
Albumin: 2 g/dL — ABNORMAL LOW (ref 3.5–5.0)
Alkaline Phosphatase: 51 U/L (ref 38–126)
Anion gap: 13 (ref 5–15)
BUN: 37 mg/dL — ABNORMAL HIGH (ref 6–20)
CO2: 43 mmol/L — ABNORMAL HIGH (ref 22–32)
Calcium: 7.2 mg/dL — ABNORMAL LOW (ref 8.9–10.3)
Chloride: 75 mmol/L — ABNORMAL LOW (ref 98–111)
Creatinine, Ser: 8.09 mg/dL — ABNORMAL HIGH (ref 0.44–1.00)
GFR, Estimated: 6 mL/min — ABNORMAL LOW (ref >60)
Glucose, Bld: 76 mg/dL (ref 70–99)
Potassium: 3.1 mmol/L — ABNORMAL LOW (ref 3.5–5.1)
Sodium: 131 mmol/L — ABNORMAL LOW (ref 135–145)
Total Bilirubin: 2 mg/dL — ABNORMAL HIGH (ref 0.3–1.2)
Total Protein: 4.1 g/dL — ABNORMAL LOW (ref 6.5–8.1)

## 2020-07-04 LAB — CK: Total CK: 1859 U/L — ABNORMAL HIGH (ref 38–234)

## 2020-07-04 LAB — BILIRUBIN, FRACTIONATED(TOT/DIR/INDIR)
Bilirubin, Direct: 0.2 mg/dL (ref 0.0–0.2)
Indirect Bilirubin: 1.5 mg/dL — ABNORMAL HIGH (ref 0.3–0.9)
Total Bilirubin: 1.7 mg/dL — ABNORMAL HIGH (ref 0.3–1.2)

## 2020-07-04 LAB — ANCA TITERS
Atypical P-ANCA titer: <1:20 {titer}
C-ANCA: <1:20 {titer}
P-ANCA: <1:20 {titer}

## 2020-07-04 LAB — ANA W/REFLEX IF POSITIVE: Anti Nuclear Antibody (ANA): NEGATIVE

## 2020-07-04 LAB — PHOSPHORUS: Phosphorus: 5.8 mg/dL — ABNORMAL HIGH (ref 2.5–4.6)

## 2020-07-04 LAB — MAGNESIUM: Magnesium: 1.6 mg/dL — ABNORMAL LOW (ref 1.7–2.4)

## 2020-07-04 MED ORDER — SENNOSIDES-DOCUSATE SODIUM 8.6-50 MG PO TABS
1.0000 | ORAL_TABLET | Freq: Every day | ORAL | Status: DC
  Administered 2020-07-05: 1 via ORAL
  Filled 2020-07-04 (×3): qty 1

## 2020-07-04 MED ORDER — SODIUM CHLORIDE 0.9 % IV SOLN: INTRAVENOUS | Status: DC

## 2020-07-04 MED ORDER — MAGNESIUM SULFATE IN D5W 1-5 GM/100ML-% IV SOLN
1.0000 g | Freq: Once | INTRAVENOUS | Status: AC
  Administered 2020-07-04: 1 g via INTRAVENOUS
  Filled 2020-07-04: qty 100

## 2020-07-04 MED ORDER — POTASSIUM CHLORIDE 20 MEQ PO PACK
40.0000 meq | PACK | Freq: Two times a day (BID) | ORAL | Status: DC
  Administered 2020-07-04 – 2020-07-05 (×4): 40 meq via ORAL
  Filled 2020-07-04 (×5): qty 2

## 2020-07-04 NOTE — Progress Notes (Signed)
Physical Therapy Treatment Patient Details Name: Carmen Daniels MRN: [id number] DOB: 1985/10/08 Today's Date: 07/04/2020    History of Present Illness 35 y.o. female admitted 06/29/20 with c/o weakness and syncope; pt reports dizziness and collapse on 5/6, was down for ~8 hrs then able to crawl to bedroom and has been in bed for 2 days prior to ED admit. Workup for ARF, rhabdomyolysis. PMH includes fibromyalgia, bipolar disorder, OCD, schizophrenia, asthma.    PT Comments    Pt reports migraine on entry and requests PT to return later. Assured pt that we would stop therapy if pain gets worse but educated pt she needs to move despite the pain. Pt reluctantly agrees. Pt is supervision with bed mobility, min guard for transfers and heavy min A for 2 bouts of ambulation with RW. PT recommends SNF level rehab at discharge due to decreased mobility and decreased physical assist available from family. PT will continue to follow acutely.     Follow Up Recommendations  SNF     Equipment Recommendations  Rolling walker with 5" wheels       Precautions / Restrictions Precautions Precautions: Fall;Other (comment) Precaution Comments: bilateral foot drop (pt reports bilateral foot in-toeing is baseline) Required Braces or Orthoses: Other Brace Other Brace: PRAFO bilateral Restrictions Weight Bearing Restrictions: No    Mobility  Bed Mobility Overal bed mobility: Modified Independent;Needs Assistance;Independent Bed Mobility: Supine to Sit;Sit to Supine     Supine to sit: Supervision Sit to supine: Supervision   General bed mobility comments: Increased time    Transfers Overall transfer level: Needs assistance Equipment used: Rolling walker (2 wheeled) Transfers: Sit to/from Stand Sit to Stand: Min guard         General transfer comment: Min guard for safety initially from elevated bed and then from recliner. cuing for hand and hip placement for safe power  up.  Ambulation/Gait Ambulation/Gait assistance: Min guard;Min assist Gait Distance (Feet): 25 Feet Assistive device: Rolling walker (2 wheeled) Gait Pattern/deviations: Step-through pattern Gait velocity: decreased Gait velocity interpretation: <1.31 ft/sec, indicative of household ambulator General Gait Details: 2 bouts of ambulation with min A to min guard for safety and close chair follow, cues for foot placement both widening base and improving foot placement due to decreased dorsiflexion and ankle eversion, self limited       Balance Overall balance assessment: Needs assistance Sitting-balance support: No upper extremity supported;Feet supported Sitting balance-Leahy Scale: Fair     Standing balance support: Bilateral upper extremity supported;During functional activity;Single extremity supported Standing balance-Leahy Scale: Poor Standing balance comment: reliant on UE support                            Cognition Arousal/Alertness: Awake/alert Behavior During Therapy: Flat affect Overall Cognitive Status: No family/caregiver present to determine baseline cognitive functioning Area of Impairment: Following commands;Problem solving;Awareness;Safety/judgement;Attention                   Current Attention Level: Sustained   Following Commands: Follows one step commands with increased time Safety/Judgement: Decreased awareness of safety;Decreased awareness of deficits Awareness: Intellectual Problem Solving: Slow processing;Requires verbal cues General Comments: pt continues to require increased encouragement for participation         General Comments General comments (skin integrity, edema, etc.): VSS on RA      Pertinent Vitals/Pain Pain Assessment: Faces Faces Pain Scale: Hurts even more Pain Location: migraine Pain Descriptors / Indicators: Grimacing;Guarding Pain Intervention(s):  Limited activity within patient's tolerance;Monitored during  session;Premedicated before session;Repositioned           PT Goals (current goals can now be found in the care plan section) Acute Rehab PT Goals Patient Stated Goal: for pain to decrease PT Goal Formulation: With patient Time For Goal Achievement: 07/15/20 Potential to Achieve Goals: Fair Progress towards PT goals: Progressing toward goals    Frequency    Min 3X/week      PT Plan Discharge plan needs to be updated       AM-PAC PT "6 Clicks" Mobility   Outcome Measure  Help needed turning from your back to your side while in a flat bed without using bedrails?: None Help needed moving from lying on your back to sitting on the side of a flat bed without using bedrails?: None Help needed moving to and from a bed to a chair (including a wheelchair)?: A Little Help needed standing up from a chair using your arms (e.g., wheelchair or bedside chair)?: A Little Help needed to walk in hospital room?: A Little Help needed climbing 3-5 steps with a railing? : A Lot 6 Click Score: 19    End of Session Equipment Utilized During Treatment: Gait belt Activity Tolerance: Patient limited by pain Patient left: with call bell/phone within reach;in chair Nurse Communication: Mobility status PT Visit Diagnosis: Other abnormalities of gait and mobility (R26.89);Muscle weakness (generalized) (M62.81)     Time: 7893-8101 PT Time Calculation (min) (ACUTE ONLY): 30 min  Charges:  $Gait Training: 23-37 mins                     Aybree Lanyon B. Migdalia Dk PT, DPT Acute Rehabilitation Services Pager [phone] Office [phone]    [place] 07/04/2020, 2:00 PM

## 2020-07-04 NOTE — Plan of Care (Signed)
°  Problem: Coping: °Goal: Level of anxiety will decrease °Outcome: Progressing °  °

## 2020-07-04 NOTE — Progress Notes (Addendum)
Subjective:  Carmen Daniels  Patient evaluated at bedside this AM. States her leg pain has improved some since yesterday, not feeling as tight. Otherwise denies chest pain, dyspnea. Discussed improvement with urine output and hopeful renal function improves over the next few days.  Objective:  Vital signs in last 24 hours: Vitals:   07/03/20 1020 07/03/20 1806 07/03/20 2030 07/04/20 0559  BP: 127/88 (!) 146/93 (!) 166/91 (!) 150/95  Pulse: 99 79 61 96  Resp: 17 17 18 18   Temp: 98.8 F (37.1 C) 98 F (36.7 C) 98.3 F (36.8 C) 98.5 F (36.9 C)  TempSrc: Oral Oral Oral Oral  SpO2: 100% 95% 98% 96%  Weight:      Height:       Physical Exam: General: Laying in bed in no acute distress CV: Regular rate, rhythm. No m/r/g appreciated. Pulm: Normal work of breathing without use accessory muscles MSK: Non-pitting edema bilaterally, L>R, improved from yesterday. Neuro: Awake, answering questions appropriately, strength in BLE improved.  CBC Latest Ref Rng & Units 07/04/2020 07/03/2020 07/02/2020  WBC 4.0 - 10.5 K/uL 6.8 6.5 6.9  Hemoglobin 12.0 - 15.0 g/dL 11.5(L) 12.0 12.0  Hematocrit 36.0 - 46.0 % 33.5(L) 34.5(L) 34.1(L)  Platelets 150 - 400 K/uL 125(L) 137(L) 149(L)   CMP Latest Ref Rng & Units 07/04/2020 07/03/2020 07/02/2020  Glucose 70 - 99 mg/dL 76 78 73  BUN 6 - 20 mg/dL 37(H) 34(H) 33(H)  Creatinine 0.44 - 1.00 mg/dL 8.09(H) 7.44(H) 6.37(H)  Sodium 135 - 145 mmol/L 131(L) 132(L) 129(L)  Potassium 3.5 - 5.1 mmol/L 3.1(L) 3.6 3.9  Chloride 98 - 111 mmol/L 75(L) 85(L) 93(L)  CO2 22 - 32 mmol/L 43(H) 37(H) 29  Calcium 8.9 - 10.3 mg/dL 7.2(L) 7.5(L) 7.7(L)  Total Protein 6.5 - 8.1 g/dL 4.1(L) 4.1(L) 4.2(L)  Total Bilirubin 0.3 - 1.2 mg/dL 2.0(H) 1.4(H) 1.4(H)  Alkaline Phos 38 - 126 U/L 51 58 67  AST 15 - 41 U/L 100(H) 140(H) 214(H)  ALT 0 - 44 U/L 198(H) 285(H) 427(H)   Assessment/Plan: Despina Boan is 35yo person living with fibromyalgia, migraines, and multiple psychiatric  disorders including bipolar 1 disorder, anxiety, depression admitted 5/8 with rhabdomyolysis with acute renal failure and liver injury, now with increased urine output despite continued worsening of renal function.  Active Problems:   Acute renal failure (HCC)   Malnutrition of moderate degree  #Rhabdomyolysis #Acute renal failure GFR 7>6, sCr 8.1<7.4. UOP improved over last 24h, 6L yesterday. On exam this morning appears less hypervolemic than yesterday. LLE ultrasound yesterday negative for DVT, most likely edematous 2/2 fluid overload. Appreciate nephrology recommendations. Will switch over to isotonic fluids, as bicarb has increased today 43<37. Pain also appears to have improved over the last 24 hours, likely due to increased urine output. Hopeful that this trend continues and we see improvement of renal function. Will continue with current pain regimen, expect to be able to de-escalate with continued diuresis. - Isotonic fluids per nephrology - Lasix 40mg  BID - po dilaudid 2mg  q8h - IV dilaudid 1mg  q2h PRN - Flexiril 5mg  TID PRN - Daily CMP - Strict I/O - PT/OT  #Acute liver injury LFT's continue to improve with IVF. This AM TBili elevated at 2.0. Will obtain fractionated bilirubin to further evaluate. - Daily CMP - F/u fractionated bilirubin  #Constipation Patient reportedly has not had BM over last 6 days, although she has not eaten anything in well over a week. Will start on scheduled laxative.  - Senokot-S daily  #  Hypokalemia #Hypomagensemia K 3.1, Mg 1.6 this AM. Likely from poor po intake. Will carefully replace Mg given AKI. - IV Mg 1g - Klor-Con 42mEq BID - F/u Mg, CMP in AM  #Bilateral foot drop Appears strength continues to improve. Expect as we continue to diurese that she will be able to work more with PT/OT to continue gaining strength. Patient prefers going home rather than SNF, will need HH PT/OT orders. - PT/OT  #Moderate malnourishment Patient states she  cannot eat hospital food as she is vegetarian. She is also having significant pain limiting her from eating as well. Appreciate RD assistance. Will need to follow-up with outpatient weight management counseling.  - Daily multivitamin - Outpatient weight management counseling  #Psychiatric disorder Continue with current regimen, will need outpatient psychiatry follow-up. - Home Klonipin 1mg  QID - Outpatient psychiatry  DIET: Regular IVF: NS DVT PPX: heparin BOWEL: n/a CODE: FULL FAM COM: Discussed with patient's mother and sister yesterday. Will call patient's sister later this afternoon.  Prior to Admission Living Arrangement: Home Anticipated Discharge Location: Home Barriers to Discharge: medical management Dispo: Anticipated discharge in approximately 3-4 day(s).   [name], MD 07/04/2020, 6:17 AM Pager: [phone] After 5pm on weekdays and 1pm on weekends: On Call pager [phone]

## 2020-07-04 NOTE — Progress Notes (Addendum)
Subjective: Patient reports she continues to have pain, decreased appetite secondary to pain. Spoke to sister who is an interventional radiologist  Objective Vital signs in last 24 hours: Vitals:   07/03/20 1806 07/03/20 2030 07/04/20 0559 07/04/20 0900  BP: (!) 146/93 (!) 166/91 (!) 150/95 (!) 146/95  Pulse: 79 61 96 97  Resp: 17 18 18 18   Temp: 98 F (36.7 C) 98.3 F (36.8 C) 98.5 F (36.9 C) 98.6 F (37 C)  TempSrc: Oral Oral Oral Oral  SpO2: 95% 98% 96% 98%  Weight:      Height:       Weight change:   Intake/Output Summary (Last 24 hours) at 07/04/2020 1107 Last data filed at 07/04/2020 0600 Gross per 24 hour  Intake 3450 ml  Output 6050 ml  Net -2600 ml    Assessment/Plan: 35 year old WF with AKi due to rhabdomyolysis, improving CK, worsening creatinine, adequate urine output on lasix and IVFs   1.Renal- AKI in the setting of rhabdomyolysis after fall and inactivity when volume depleted.  Is continuing to have adequate diuresis.  -discontinue bicarb based IVF and transition to NS IVF to help with hyponatremia and decrease elevated bicarbonate -remains with no indications for dialysis at this time.  -CK continuing to improve on current therapy, down from 2203 to 1859   -creatinine continues to trend upward, up from 7.4 to 8.09 today, with increased UOP, suspect that patient's renal function is reaching a pivotal point and will see plateau of creatinine with subsequent improvement   Plan today is to change IVF from bicarb to NS since she is sufficiently alkalotic-  According to family pt is really miserable with swelling so will dec rate of IVF-  Her UOP basically doubled over the last 24 hours so I think the combo of decreasing the IVF and her naturally increasing UOP will lead to improvement.  I hope we are really close here to turning the corner.  No indications for HD    2. Hypertension/volume  - BP remains elevated, will monitor volume status 3. Anemia  - Hgb drop from  12 to 11.5 4. Hypokalemia: Patient to receive 40 mill equivalents of Kdur, 2 doses 5. Hypomagnesemia: IV magnesium replacement ordered 6. Hypocalcemia: Corrects to 8.8 from 7.2 when corrected for low albumin, continue to monitor   Makiera Simmons-Robinson   Patient seen and examined, agree with above note with above modifications. Plan is to change IVF to NS and decrease rate.  Since her UOP doubled on the same dose of diuretics am really hoping that we are close to seeing this turn around  Corliss Parish, MD 07/04/2020         Labs: Basic Metabolic Panel: Recent Labs  Lab 07/02/20 0446 07/02/20 1700 07/03/20 0340 07/04/20 0425  NA 129*  --  132* 131*  K 3.9  --  3.6 3.1*  CL 93*  --  85* 75*  CO2 29  --  37* 43*  GLUCOSE 73  --  78 76  BUN 33*  --  34* 37*  CREATININE 6.37*  --  7.44* 8.09*  CALCIUM 7.7*  --  7.5* 7.2*  PHOS 4.9* 4.8* 5.4* 5.8*   Liver Function Tests: Recent Labs  Lab 07/02/20 0446 07/03/20 0340 07/04/20 0425  AST 214* 140* 100*  ALT 427* 285* 198*  ALKPHOS 67 58 51  BILITOT 1.4* 1.4* 2.0*  PROT 4.2* 4.1* 4.1*  ALBUMIN 2.1* 2.1* 2.0*   No results for input(s): LIPASE, AMYLASE  in the last 168 hours. No results for input(s): AMMONIA in the last 168 hours. CBC: Recent Labs  Lab 06/30/20 0821 07/01/20 0500 07/02/20 0446 07/03/20 0340 07/04/20 0425  WBC 7.0 5.9 6.9 6.5 6.8  NEUTROABS 4.6  --   --   --   --   HGB 12.3 11.5* 12.0 12.0 11.5*  HCT 35.4* 33.7* 34.1* 34.5* 33.5*  MCV 86.3 87.3 85.7 84.8 85.9  PLT 156 141* 149* 137* 125*   Cardiac Enzymes: Recent Labs  Lab 06/30/20 2300 07/01/20 0700 07/02/20 0446 07/03/20 0340 07/04/20 0425  CKTOTAL 6,277* 4,925* 3,386* 2,203* 1,859*   CBG: Recent Labs  Lab 06/29/20 1914  GLUCAP 76    Iron Studies: No results for input(s): IRON, TIBC, TRANSFERRIN, FERRITIN in the last 72 hours. Studies/Results: VAS [place] LOWER EXTREMITY VENOUS (DVT)  Result Date: 07/03/2020  Lower Venous  DVT Study Patient Name:  KALENE CUTLER  Date of Exam:   07/03/2020 Medical Rec #: [id number]       Accession #:    [id number] Date of Birth: 05/15/1985        Patient Gender: F Patient Age:   35Y Exam Location:  [facility] Procedure:      VAS [place] LOWER EXTREMITY VENOUS (DVT) Referring Phys: [id number] EMILY B MULLEN --------------------------------------------------------------------------------  Indications: Edema. Other Indications: Rhabdo. concerned for compartment syndrome. Comparison Study: No previous exams Performing Technologist: Rogelia Rohrer  Examination Guidelines: A complete evaluation includes B-mode imaging, spectral Doppler, color Doppler, and power Doppler as needed of all accessible portions of each vessel. Bilateral testing is considered an integral part of a complete examination. Limited examinations for reoccurring indications may be performed as noted. The reflux portion of the exam is performed with the patient in reverse Trendelenburg.  +-----+---------------+---------+-----------+----------+--------------+ RIGHTCompressibilityPhasicitySpontaneityPropertiesThrombus Aging +-----+---------------+---------+-----------+----------+--------------+ CFV  Full           Yes      Yes                                 +-----+---------------+---------+-----------+----------+--------------+   +---------+---------------+---------+-----------+----------+-------------------+ LEFT     CompressibilityPhasicitySpontaneityPropertiesThrombus Aging      +---------+---------------+---------+-----------+----------+-------------------+ CFV      Full           Yes      Yes                                      +---------+---------------+---------+-----------+----------+-------------------+ SFJ      Full                                                             +---------+---------------+---------+-----------+----------+-------------------+ FV Prox  Full           Yes      Yes                                       +---------+---------------+---------+-----------+----------+-------------------+ FV Mid   Full           Yes      Yes                                      +---------+---------------+---------+-----------+----------+-------------------+  FV DistalFull           Yes      Yes                                      +---------+---------------+---------+-----------+----------+-------------------+ PFV      Full                                                             +---------+---------------+---------+-----------+----------+-------------------+ POP      Full           Yes      Yes                                      +---------+---------------+---------+-----------+----------+-------------------+ PTV      Full                                                             +---------+---------------+---------+-----------+----------+-------------------+ PERO     Full                                         Not well visualized +---------+---------------+---------+-----------+----------+-------------------+    Summary: RIGHT: - No evidence of common femoral vein obstruction.  LEFT: - There is no evidence of deep vein thrombosis in the lower extremity. - There is no evidence of superficial venous thrombosis.  - No cystic structure found in the popliteal fossa. Subcutaneous edema from area of popliteal to ankle.  *See table(s) above for measurements and observations. Electronically signed by Jamelle Haring on 07/03/2020 at 4:27:00 PM.    Final    Medications: Infusions: .  sodium bicarbonate (isotonic) infusion in sterile water 250 mL/hr at 07/04/20 0932    Scheduled Medications: . Chlorhexidine Gluconate Cloth  6 each Topical Daily  . clonazePAM  1 mg Oral QID  . furosemide  40 mg Intravenous Q12H  . heparin  5,000 Units Subcutaneous Q8H  . HYDROmorphone  2 mg Oral Q8H  . multivitamin with minerals  1 tablet Oral Daily  . nicotine  14 mg  Transdermal Daily  . potassium chloride  40 mEq Oral BID  . sodium chloride flush  10-40 mL Intracatheter Q12H    Physical Exam: General: Female, uncomfortable appearing, lying supine in bed Heart: Regular rate and rhythm Lungs: Normal work of breathing, room air Abdomen: Soft, tenderness with light palpation, nondistended Extremities: Minimally edematous lower extremities, tender to palpation, no skin changes noted   07/04/2020,11:07 AM  LOS: 4 days

## 2020-07-05 DIAGNOSIS — D696 Thrombocytopenia, unspecified: Secondary | ICD-10-CM

## 2020-07-05 LAB — COMPREHENSIVE METABOLIC PANEL
ALT: 160 U/L — ABNORMAL HIGH (ref 0–44)
AST: 85 U/L — ABNORMAL HIGH (ref 15–41)
Albumin: 2.3 g/dL — ABNORMAL LOW (ref 3.5–5.0)
Alkaline Phosphatase: 47 U/L (ref 38–126)
Anion gap: 16 — ABNORMAL HIGH (ref 5–15)
BUN: 38 mg/dL — ABNORMAL HIGH (ref 6–20)
CO2: 42 mmol/L — ABNORMAL HIGH (ref 22–32)
Calcium: 7.6 mg/dL — ABNORMAL LOW (ref 8.9–10.3)
Chloride: 77 mmol/L — ABNORMAL LOW (ref 98–111)
Creatinine, Ser: 8.32 mg/dL — ABNORMAL HIGH (ref 0.44–1.00)
GFR, Estimated: 6 mL/min — ABNORMAL LOW (ref >60)
Glucose, Bld: 79 mg/dL (ref 70–99)
Potassium: 3.2 mmol/L — ABNORMAL LOW (ref 3.5–5.1)
Sodium: 135 mmol/L (ref 135–145)
Total Bilirubin: 2.3 mg/dL — ABNORMAL HIGH (ref 0.3–1.2)
Total Protein: 4.8 g/dL — ABNORMAL LOW (ref 6.5–8.1)

## 2020-07-05 LAB — CK: Total CK: 1362 U/L — ABNORMAL HIGH (ref 38–234)

## 2020-07-05 LAB — CBC
HCT: 36 % (ref 36.0–46.0)
Hemoglobin: 12.4 g/dL (ref 12.0–15.0)
MCH: 30 pg (ref 26.0–34.0)
MCHC: 34.4 g/dL (ref 30.0–36.0)
MCV: 87 fL (ref 80.0–100.0)
Platelets: 123 10*3/uL — ABNORMAL LOW (ref 150–400)
RBC: 4.14 MIL/uL (ref 3.87–5.11)
RDW: 13.1 % (ref 11.5–15.5)
WBC: 6.7 10*3/uL (ref 4.0–10.5)
nRBC: 0 % (ref 0.0–0.2)

## 2020-07-05 LAB — TECHNOLOGIST SMEAR REVIEW

## 2020-07-05 LAB — MAGNESIUM: Magnesium: 2 mg/dL (ref 1.7–2.4)

## 2020-07-05 LAB — SAVE SMEAR(SSMR), FOR PROVIDER SLIDE REVIEW

## 2020-07-05 LAB — PHOSPHORUS: Phosphorus: 6.7 mg/dL — ABNORMAL HIGH (ref 2.5–4.6)

## 2020-07-05 LAB — LACTATE DEHYDROGENASE: LDH: 369 U/L — ABNORMAL HIGH (ref 98–192)

## 2020-07-05 NOTE — Plan of Care (Signed)
  Problem: Education: Goal: Knowledge of General Education information will improve Description: Including pain rating scale, medication(s)/side effects and non-pharmacologic comfort measures Outcome: Progressing   Problem: Activity: Goal: Risk for activity intolerance will decrease Outcome: Progressing   Problem: Nutrition: Goal: Adequate nutrition will be maintained Outcome: Progressing   Problem: Coping: Goal: Level of anxiety will decrease Outcome: Progressing   Problem: Pain Managment: Goal: General experience of comfort will improve Outcome: Progressing   Problem: Elimination: Goal: Will not experience complications related to bowel motility Outcome: Progressing

## 2020-07-05 NOTE — Progress Notes (Signed)
Subjective:   Patient evaluated at bedside this AM. Overnight, notes her pain kept waking her up and she is endorsing generalized pain this morning as well. She does note that she was able to work with physical therapy more yesterday and her legs fel better over all. Decreased appetite because of the pain.   Objective:  Vital signs in last 24 hours: Vitals:   07/04/20 0559 07/04/20 0900 07/04/20 1712 07/05/20 0452  BP: (!) 150/95 (!) 146/95 (!) 140/92 132/84  Pulse: 96 97 93 (!) 101  Resp: 18 18 18 17   Temp: 98.5 F (36.9 C) 98.6 F (37 C) 98.6 F (37 C) 99.2 F (37.3 C)  TempSrc: Oral Oral Oral Oral  SpO2: 96% 98% 98% 94%  Weight:      Height:       Physical Exam: General: Laying in bed, no acute distress CV: Regular rate, rhythm. No m/r/g appreciated Pulm: No use of accessory muscles, normal work of breathing on room air  MSK: Non pitting edema bilaterally, L>R, improved from previous days Skin: No rashes or lesions present.  CBC Latest Ref Rng & Units 07/05/2020 07/04/2020 07/03/2020  WBC 4.0 - 10.5 K/uL 6.7 6.8 6.5  Hemoglobin 12.0 - 15.0 g/dL 12.4 11.5(L) 12.0  Hematocrit 36.0 - 46.0 % 36.0 33.5(L) 34.5(L)  Platelets 150 - 400 K/uL 123(L) 125(L) 137(L)   BMP Latest Ref Rng & Units 07/05/2020 07/04/2020 07/03/2020  Glucose 70 - 99 mg/dL 79 76 78  BUN 6 - 20 mg/dL 38(H) 37(H) 34(H)  Creatinine 0.44 - 1.00 mg/dL 8.32(H) 8.09(H) 7.44(H)  Sodium 135 - 145 mmol/L 135 131(L) 132(L)  Potassium 3.5 - 5.1 mmol/L 3.2(L) 3.1(L) 3.6  Chloride 98 - 111 mmol/L 77(L) 75(L) 85(L)  CO2 22 - 32 mmol/L 42(H) 43(H) 37(H)  Calcium 8.9 - 10.3 mg/dL 7.6(L) 7.2(L) 7.5(L)   Assessment/Plan: Carmen Daniels is 35yo person living with fibromyalgia, migraines, and multiple psychiatric disorders including bipolar 1 disorder, anxiety, depression admitted 5/8 with rhabdomyolysis with acute renal failure and live injury, continues to have good urine output and mild improvement of symptoms.  Active  Problems:   Acute renal failure (HCC)   Malnutrition of moderate degree  #Rhabdomyolysis #Acute renal failure Renal function overall stable from yesterday, sCr 8.3<8.1, GFR 6<6. Over last 24 hours has had 6.5L UOP, >12L UOP over last 48 hours. Since admission starting to become more euvolemic and not as hypervolemic, as noted on her exam this morning as well. Lab work also revealed anion gap in setting of elevated bicarbonate. Most likely explained by multiple days of bicarbonate infusions along with degree of ketoacidosis from decreased po intake. Phosphate this AM 6.7 likely 2/2 renal function. Will defer to nephrology if phosphate binder is needed. ANA and ANCA negative, less likely autoimmune or vasculitis picture. Renal function could be ATN 2/2 rhabdomyolysis causing acute renal failure. Hopeful we will start to see improvement of renal function tomorrow with continued diuresis. Also expect leg pain to continue improving so we can decrease opioid burden. - NS 75cc/hr - IV lasix 40mg  BID - Flexeril 5mg  TID PRN - IV dilaudid q2h PRN - po dilaudid q8h - Daily CMP, phos - Strict I/O - PT/OT  #Thrombocytopenia #Hyperbilirubinemia Over the last few days platelet count has steadily dropped while other cell lines have been stable with IVF. In addition, fractionated bilirubin revealed increased indirect bilirubin. Further work-up revealed elevated LDH, still pending haptoglobin. Peripheral smear without schistocytes per tech read. Patient is not anemic  this AM with Hgb 12.3. Most likely lab abnormalities due to combination of starvation and dilutional from fluids. Will continue to monitor, will further work-up if no normalization after improvement of symptoms and kidney function. - Daily CBC, CMP - Will review smear  #Hypokalemia K 3.2 this AM, Mg improved after repletion yesterday. Likely this is 2/2 decreased po intake. Will continue with oral potassium supplementation. - Klor-Con 39mEq BID -  Daily Mg, CMP  #Acute liver injury LFT's continue to decrease with IVF, low suspicion for other process occurring. Will continue to monitor. - Daily LFT's  #Moderate malnourishment Patient continues to endorse she is not having an appetite, saying it is difficult to eat due to the pain she is having. Hopeful that the pain will soon improve so she will be able to eat. Appreciate RD assistance, planning on outpatient weight management counseling. - Daily multivitamin - Outpatient weight management counseling  #Bilateral foot drop This morning patient overall feeling like her symptoms have improved, having a little more strength. Expect this continue to improve as we continue diuresis and renal function improves. Per PT/OT notes, patient has agreed to SNF placement now. Believe this would be beneficial, although she could defer to home PT/OT once she is feeling better. - PT/OT  #Constipation No BM, although she has not been able to eat much in almost two weeks now. - Daily Senokot-S  #Psychiatric disorders No changes, she is currently stable on home medication. - Home clonazepam 1mg  QID - Outpatient psychiatry follow-up  DIET: Regular IVF: NS DVT PPX: heparin BOWEL: n/a CODE: FULL FAM COM: Will call patient's sister, Carmen Daniels, this afternoon.  Prior to Admission Living Arrangement: Home Anticipated Discharge Location: SNF per PT/OT Barriers to Discharge: medical management Dispo: Anticipated discharge in approximately 3-4 day(s).   [name], MD 07/05/2020, 6:14 AM Pager: [phone] After 5pm on weekdays and 1pm on weekends: On Call pager [phone]

## 2020-07-05 NOTE — Progress Notes (Signed)
Subjective:  Made 7 liters of urine- and IVF rate was decreased so ended up 5 liters negative -  crt up but only 8.0 to 8.3.   Still in pain-  Seems more muscle pain from injury-  Now that volume is better can move legs around a little more effectively    Objective Vital signs in last 24 hours: Vitals:   07/05/20 0452 07/05/20 0813 07/05/20 0921 07/05/20 0928  BP: 132/84 (!) 128/96 (!) 141/92   Pulse: (!) 101 100 94 90  Resp: 17 20 18    Temp: 99.2 F (37.3 C) 98.8 F (37.1 C) 98.4 F (36.9 C)   TempSrc: Oral Oral Oral   SpO2: 94% (!) 85% (!) 84% 95%  Weight:      Height:       Weight change:   Intake/Output Summary (Last 24 hours) at 07/05/2020 1107 Last data filed at 07/05/2020 0272 Gross per 24 hour  Intake 1888.75 ml  Output 8550 ml  Net -6661.25 ml    Assessment/Plan: 35 year old WF with AKi due to rhabdomyolysis, improving CK, worsening creatinine, adequate urine output on lasix and IVFs   1.Renal- AKI in the setting of rhabdomyolysis after fall and inactivity when volume depleted.  Prolonged course.  I am seeing positive signs with increased UOP with same lasix dosing in addition to a slower rate of rise with that crt.  I am actually going to stop the lasix-  Feel that she will continue to aurodiurese  2. Hypertension/volume  - BP remains elevated, but with 5 liters negative is much better 3. Anemia  - Has not required any treatment 4. Hypokalemia: on 40 BID, ok to continue 5. Hypomagnesemia: better with repletion 6. Hypocalcemia: Corrects to 8.8 from 7.2 when corrected for low albumin, continue to monitor   [place]: Basic Metabolic Panel: Recent Labs  Lab 07/03/20 0340 07/04/20 0425 07/05/20 0421  NA 132* 131* 135  K 3.6 3.1* 3.2*  CL 85* 75* 77*  CO2 37* 43* 42*  GLUCOSE 78 76 79  BUN 34* 37* 38*  CREATININE 7.44* 8.09* 8.32*  CALCIUM 7.5* 7.2* 7.6*  PHOS 5.4* 5.8* 6.7*   Liver Function Tests: Recent Labs  Lab  07/03/20 0340 07/04/20 0425 07/05/20 0421  AST 140* 100* 85*  ALT 285* 198* 160*  ALKPHOS 58 51 47  BILITOT 1.4* 1.7*  2.0* 2.3*  PROT 4.1* 4.1* 4.8*  ALBUMIN 2.1* 2.0* 2.3*   No results for input(s): LIPASE, AMYLASE in the last 168 hours. No results for input(s): AMMONIA in the last 168 hours. CBC: Recent Labs  Lab 06/30/20 0821 07/01/20 0500 07/02/20 0446 07/03/20 0340 07/04/20 0425 07/05/20 0421  WBC 7.0 5.9 6.9 6.5 6.8 6.7  NEUTROABS 4.6  --   --   --   --   --   HGB 12.3 11.5* 12.0 12.0 11.5* 12.4  HCT 35.4* 33.7* 34.1* 34.5* 33.5* 36.0  MCV 86.3 87.3 85.7 84.8 85.9 87.0  PLT 156 141* 149* 137* 125* 123*   Cardiac Enzymes: Recent Labs  Lab 07/01/20 0700 07/02/20 0446 07/03/20 0340 07/04/20 0425 07/05/20 0421  CKTOTAL 4,925* 3,386* 2,203* 1,859* 1,362*   CBG: Recent Labs  Lab 06/29/20 1914  GLUCAP 76    Iron Studies: No results for input(s): IRON, TIBC, TRANSFERRIN, FERRITIN in the last 72 hours. Studies/Results: VAS [place] LOWER EXTREMITY VENOUS (DVT)  Result Date: 07/03/2020  Lower Venous DVT Study Patient  Name:  Carmen Daniels  Date of Exam:   07/03/2020 Medical Rec #: [id number]       Accession #:    [id number] Date of Birth: [birth year]        Patient Gender: F Patient Age:   27Y Exam Location:  [facility] Procedure:      VAS [place] LOWER EXTREMITY VENOUS (DVT) Referring Phys: [id number] EMILY B MULLEN --------------------------------------------------------------------------------  Indications: Edema. Other Indications: Rhabdo. concerned for compartment syndrome. Comparison Study: No previous exams Performing Technologist: Rogelia Rohrer  Examination Guidelines: A complete evaluation includes B-mode imaging, spectral Doppler, color Doppler, and power Doppler as needed of all accessible portions of each vessel. Bilateral testing is considered an integral part of a complete examination. Limited examinations for reoccurring indications may be performed as noted. The reflux  portion of the exam is performed with the patient in reverse Trendelenburg.  +-----+---------------+---------+-----------+----------+--------------+ RIGHTCompressibilityPhasicitySpontaneityPropertiesThrombus Aging +-----+---------------+---------+-----------+----------+--------------+ CFV  Full           Yes      Yes                                 +-----+---------------+---------+-----------+----------+--------------+   +---------+---------------+---------+-----------+----------+-------------------+ LEFT     CompressibilityPhasicitySpontaneityPropertiesThrombus Aging      +---------+---------------+---------+-----------+----------+-------------------+ CFV      Full           Yes      Yes                                      +---------+---------------+---------+-----------+----------+-------------------+ SFJ      Full                                                             +---------+---------------+---------+-----------+----------+-------------------+ FV Prox  Full           Yes      Yes                                      +---------+---------------+---------+-----------+----------+-------------------+ FV Mid   Full           Yes      Yes                                      +---------+---------------+---------+-----------+----------+-------------------+ FV DistalFull           Yes      Yes                                      +---------+---------------+---------+-----------+----------+-------------------+ PFV      Full                                                             +---------+---------------+---------+-----------+----------+-------------------+  POP      Full           Yes      Yes                                      +---------+---------------+---------+-----------+----------+-------------------+ PTV      Full                                                              +---------+---------------+---------+-----------+----------+-------------------+ PERO     Full                                         Not well visualized +---------+---------------+---------+-----------+----------+-------------------+    Summary: RIGHT: - No evidence of common femoral vein obstruction.  LEFT: - There is no evidence of deep vein thrombosis in the lower extremity. - There is no evidence of superficial venous thrombosis.  - No cystic structure found in the popliteal fossa. Subcutaneous edema from area of popliteal to ankle.  *See table(s) above for measurements and observations. Electronically signed by Jamelle Haring on 07/03/2020 at 4:27:00 PM.    Final    Medications: Infusions: . sodium chloride 75 mL/hr at 07/04/20 2252    Scheduled Medications: . Chlorhexidine Gluconate Cloth  6 each Topical Daily  . clonazePAM  1 mg Oral QID  . furosemide  40 mg Intravenous Q12H  . heparin  5,000 Units Subcutaneous Q8H  . HYDROmorphone  2 mg Oral Q8H  . multivitamin with minerals  1 tablet Oral Daily  . nicotine  14 mg Transdermal Daily  . potassium chloride  40 mEq Oral BID  . senna-docusate  1 tablet Oral QHS  . sodium chloride flush  10-40 mL Intracatheter Q12H    Physical Exam: General: Female, uncomfortable appearing, lying supine in bed Heart: Regular rate and rhythm Lungs: Normal work of breathing, room air Abdomen: Soft, tenderness with light palpation, nondistended Extremities: Minimally edematous lower extremities, tender to palpation, no skin changes noted   07/05/2020,11:07 AM  LOS: 5 days

## 2020-07-06 LAB — COMPREHENSIVE METABOLIC PANEL
ALT: 142 U/L — ABNORMAL HIGH (ref 0–44)
AST: 72 U/L — ABNORMAL HIGH (ref 15–41)
Albumin: 2.5 g/dL — ABNORMAL LOW (ref 3.5–5.0)
Alkaline Phosphatase: 51 U/L (ref 38–126)
Anion gap: 11 (ref 5–15)
BUN: 36 mg/dL — ABNORMAL HIGH (ref 6–20)
CO2: 44 mmol/L — ABNORMAL HIGH (ref 22–32)
Calcium: 8.5 mg/dL — ABNORMAL LOW (ref 8.9–10.3)
Chloride: 84 mmol/L — ABNORMAL LOW (ref 98–111)
Creatinine, Ser: 7.1 mg/dL — ABNORMAL HIGH (ref 0.44–1.00)
GFR, Estimated: 7 mL/min — ABNORMAL LOW (ref >60)
Glucose, Bld: 98 mg/dL (ref 70–99)
Potassium: 2.9 mmol/L — ABNORMAL LOW (ref 3.5–5.1)
Sodium: 139 mmol/L (ref 135–145)
Total Bilirubin: 1 mg/dL (ref 0.3–1.2)
Total Protein: 5.2 g/dL — ABNORMAL LOW (ref 6.5–8.1)

## 2020-07-06 LAB — CBC
HCT: 38.5 % (ref 36.0–46.0)
Hemoglobin: 12.8 g/dL (ref 12.0–15.0)
MCH: 29.6 pg (ref 26.0–34.0)
MCHC: 33.2 g/dL (ref 30.0–36.0)
MCV: 89.1 fL (ref 80.0–100.0)
Platelets: 141 10*3/uL — ABNORMAL LOW (ref 150–400)
RBC: 4.32 MIL/uL (ref 3.87–5.11)
RDW: 13.3 % (ref 11.5–15.5)
WBC: 5.8 10*3/uL (ref 4.0–10.5)
nRBC: 0 % (ref 0.0–0.2)

## 2020-07-06 LAB — PHOSPHORUS: Phosphorus: 6.5 mg/dL — ABNORMAL HIGH (ref 2.5–4.6)

## 2020-07-06 LAB — HAPTOGLOBIN: Haptoglobin: 90 mg/dL (ref 33–278)

## 2020-07-06 LAB — MAGNESIUM: Magnesium: 1.9 mg/dL (ref 1.7–2.4)

## 2020-07-06 MED ORDER — POTASSIUM CHLORIDE CRYS ER 20 MEQ PO TBCR
40.0000 meq | EXTENDED_RELEASE_TABLET | Freq: Two times a day (BID) | ORAL | Status: DC
  Administered 2020-07-06 (×2): 40 meq via ORAL
  Filled 2020-07-06 (×2): qty 2

## 2020-07-06 MED ORDER — MAGNESIUM SULFATE 2 GM/50ML IV SOLN
2.0000 g | Freq: Once | INTRAVENOUS | Status: AC
  Administered 2020-07-06: 2 g via INTRAVENOUS
  Filled 2020-07-06: qty 50

## 2020-07-06 MED ORDER — POTASSIUM CHLORIDE 20 MEQ PO PACK
60.0000 meq | PACK | Freq: Two times a day (BID) | ORAL | Status: DC
  Filled 2020-07-06: qty 3

## 2020-07-06 MED ORDER — HYDROMORPHONE HCL 1 MG/ML IJ SOLN
0.0500 mg | INTRAMUSCULAR | Status: DC | PRN
  Administered 2020-07-06 (×2): 1 mg via INTRAVENOUS
  Administered 2020-07-06: 0.5 mg via INTRAVENOUS
  Administered 2020-07-07: 1 mg via INTRAVENOUS
  Filled 2020-07-06 (×4): qty 1

## 2020-07-06 NOTE — Progress Notes (Signed)
Subjective:  Made another 7.7  liters of urine even with lasix stopped-  ended up 5 liters negative again-  crt DOWN !!.   Still in pain-  Seems more muscle pain from injury-  Now that volume is better can move legs around a little more effectively    Objective Vital signs in last 24 hours: Vitals:   07/05/20 1736 07/05/20 2155 07/06/20 0247 07/06/20 0733  BP: (!) 130/100 (!) 150/96 135/89 138/84  Pulse: 90 97 95 95  Resp: 17 16 18    Temp: 97.9 F (36.6 C) 98.3 F (36.8 C) 98.4 F (36.9 C)   TempSrc: Oral Oral Oral   SpO2: 95% 94% 95% 91%  Weight:      Height:       Weight change:   Intake/Output Summary (Last 24 hours) at 07/06/2020 1036 Last data filed at 07/06/2020 0900 Gross per 24 hour  Intake 2135.78 ml  Output 7125 ml  Net -4989.22 ml    Assessment/Plan: 35 year old WF with AKi due to rhabdomyolysis, improving CK, worsening creatinine, adequate urine output on lasix and IVFs   1.Renal- AKI in the setting of rhabdomyolysis after fall and inactivity when volume depleted.  Prolonged course. Today was the first day that the creatinine decreased !!   Feel that she will continue to aurodiurese - I think need to continue IVF to keep from getting volume depleted or hypotensive for now  2. Hypertension/volume  - BP better with 10 liters negative  3. Anemia  - Has not required any treatment 4. Hypokalemia: on 40 BID, ok to continue-  Also encouraged to eat 5. Hypomagnesemia: fine with repletion 6. Hypocalcemia: Corrects to normal 7. Dispo-  Does not need to stay in house until crt normal-  just continuing to trend right.  Depending on what her crt is at discharge  She may need one follow up appt with [place] upon discharge   [place]: Basic Metabolic Panel: Recent Labs  Lab 07/04/20 0425 07/05/20 0421 07/06/20 0338  NA 131* 135 139  K 3.1* 3.2* 2.9*  CL 75* 77* 84*  CO2 43* 42* 44*  GLUCOSE 76 79 98  BUN 37* 38* 36*  CREATININE 8.09*  8.32* 7.10*  CALCIUM 7.2* 7.6* 8.5*  PHOS 5.8* 6.7* 6.5*   Liver Function Tests: Recent Labs  Lab 07/04/20 0425 07/05/20 0421 07/06/20 0338  AST 100* 85* 72*  ALT 198* 160* 142*  ALKPHOS 51 47 51  BILITOT 1.7*  2.0* 2.3* 1.0  PROT 4.1* 4.8* 5.2*  ALBUMIN 2.0* 2.3* 2.5*   No results for input(s): LIPASE, AMYLASE in the last 168 hours. No results for input(s): AMMONIA in the last 168 hours. CBC: Recent Labs  Lab 06/30/20 0821 07/01/20 0500 07/02/20 0446 07/03/20 0340 07/04/20 0425 07/05/20 0421 07/06/20 0338  WBC 7.0   < > 6.9 6.5 6.8 6.7 5.8  NEUTROABS 4.6  --   --   --   --   --   --   HGB 12.3   < > 12.0 12.0 11.5* 12.4 12.8  HCT 35.4*   < > 34.1* 34.5* 33.5* 36.0 38.5  MCV 86.3   < > 85.7 84.8 85.9 87.0 89.1  PLT 156   < > 149* 137* 125* 123* 141*   < > = values in this interval not displayed.   Cardiac Enzymes: Recent Labs  Lab 07/01/20 0700 07/02/20 0446 07/03/20 0340 07/04/20 0425  07/05/20 0421  CKTOTAL 4,925* 3,386* 2,203* 1,859* 1,362*   CBG: Recent Labs  Lab 06/29/20 1914  GLUCAP 76    Iron Studies: No results for input(s): IRON, TIBC, TRANSFERRIN, FERRITIN in the last 72 hours. Studies/Results: No results found. Medications: Infusions: . sodium chloride 75 mL/hr at 07/06/20 0351    Scheduled Medications: . Chlorhexidine Gluconate Cloth  6 each Topical Daily  . clonazePAM  1 mg Oral QID  . heparin  5,000 Units Subcutaneous Q8H  . HYDROmorphone  2 mg Oral Q8H  . multivitamin with minerals  1 tablet Oral Daily  . nicotine  14 mg Transdermal Daily  . potassium chloride  40 mEq Oral BID  . senna-docusate  1 tablet Oral QHS  . sodium chloride flush  10-40 mL Intracatheter Q12H    Physical Exam: General: Female, uncomfortable appearing, lying supine in bed-  Is perking up a bit Heart: Regular rate and rhythm Lungs: Normal work of breathing, room air Abdomen: Soft, tenderness with light palpation, nondistended Extremities: Minimally  edematous lower extremities, tender to palpation, no skin changes noted   07/06/2020,10:36 AM  LOS: 6 days

## 2020-07-06 NOTE — Progress Notes (Signed)
Subjective: HD #6 Overnight, no acute events reported.  This morning, [name] is evaluated at bedside. She is resting comfortably in bed but does report ongoing bilateral lower extremity pain and continued low appetite.   Objective:  Vital signs in last 24 hours: Vitals:   07/05/20 2155 07/06/20 0247 07/06/20 0733 07/06/20 1206  BP: (!) 150/96 135/89 138/84 125/85  Pulse: 97 95 95 95  Resp: 16 18  19   Temp: 98.3 F (36.8 C) 98.4 F (36.9 C)    TempSrc: Oral Oral    SpO2: 94% 95% 91% 94%  Weight:      Height:       CBC Latest Ref Rng & Units 07/06/2020 07/05/2020 07/04/2020  WBC 4.0 - 10.5 K/uL 5.8 6.7 6.8  Hemoglobin 12.0 - 15.0 g/dL 12.8 12.4 11.5(L)  Hematocrit 36.0 - 46.0 % 38.5 36.0 33.5(L)  Platelets 150 - 400 K/uL 141(L) 123(L) 125(L)   CMP Latest Ref Rng & Units 07/06/2020 07/05/2020 07/04/2020  Glucose 70 - 99 mg/dL 98 79 -  BUN 6 - 20 mg/dL 36(H) 38(H) -  Creatinine 0.44 - 1.00 mg/dL 7.10(H) 8.32(H) -  Sodium 135 - 145 mmol/L 139 135 -  Potassium 3.5 - 5.1 mmol/L 2.9(L) 3.2(L) -  Chloride 98 - 111 mmol/L 84(L) 77(L) -  CO2 22 - 32 mmol/L 44(H) 42(H) -  Calcium 8.9 - 10.3 mg/dL 8.5(L) 7.6(L) -  Total Protein 6.5 - 8.1 g/dL 5.2(L) 4.8(L) -  Total Bilirubin 0.3 - 1.2 mg/dL 1.0 2.3(H) 1.7(H)  Alkaline Phos 38 - 126 U/L 51 47 -  AST 15 - 41 U/L 72(H) 85(H) -  ALT 0 - 44 U/L 142(H) 160(H) -    Intake/Output Summary (Last 24 hours) at 07/06/2020 1437 Last data filed at 07/06/2020 1423 Gross per 24 hour  Intake 2355.78 ml  Output 7075 ml  Net -4719.22 ml   Physical Exam  Constitutional: Appears well-developed and well-nourished. No distress.  Cardiovascular: Normal rate, regular rhythm, S1 and S2 present, no murmurs, rubs, gallops.  Distal pulses intact Respiratory: Effort normal on room air. Lungs are clear to auscultation bilaterally. Musculoskeletal: Normal bulk and tone.  Improved non-pitting edema of bilateral extremities; bilateral calf tenderness   Neurological: Is alert and oriented x4, no apparent focal deficits noted. Skin: Warm and dry.  No rash, erythema, lesions noted.  Assessment/Plan:  Active Problems:   Acute renal failure (HCC)   Malnutrition of moderate degree [name] is a 35 yr old female with PMHx of migraines, fibromyalgia, bipolar 1 disorder, depression/anxiety and OCD admitted with rhabdomyolysis with acute renal failure and liver injury.   Acute renal failure 2/2 rhabdomyolysis  Hypokalemia  Renal function improved today with sCr 7.1<8.3, UOP 7.7L over past 24 hours. Patient is net -5.3L from admission. She appears euvolemic on exam. Anion gap also seems to have improved and bicarb is slowly downtrending. Her phosphorous is also slightly improved, suspect this was in setting of acute renal failure and will continue to normalize as her renal function improves. She does, however, have significant hypokalemia with K 2.9 this AM. She admits to not taking her potassium packets due to distastefulness. She is agreeable to taking pills instead.  She does, however, endorse ongoing bilateral lower extremity pain and is requesting more pain medication. Discussed with RN who noted concerns for excessive drowsiness and respiratory depression yesterday. Will hold off on further increase in opiate pain medication at this time. Can consider adding tylenol as her hepatic  function improves.  - Continue IV NS 75 cc/hr - Strict I&Os - Monitor renal function - Monitor and replete electrolytes  - Avoid nephrotoxic agents as able  - Continue PT/OT eval  - Continue oral dilaudid 2mg  q8h + IV dilaudid 0.5-1mg  q2h prn  Acute liver injury  Hyperbilirubinemia  Patient's transaminitis continues to improve with fluids and hyperbilirubinemia is also resolved at this time. Initial concerns for possible hemolysis given thrombocytopenia, elevated LDH and thrombocytopenia. However, smear review without schistocytes. Haptoglobin pending. Lab  abnormalities likely from starvation and fluid dilution.  - Daily CBC and CMP - F/u pathologist review   Moderate malnourishment  Continues to endorse decreased appetite as she does not like the hospital food. She endorses that her mother is bringing her favorite vegetable soup from home. Appreciate RD assistance, plan for outpatient weight management. Would encourage against future restrictive diets.  - Daily multivitamin - Outpatient weight management   Psychiatric disorders: Continuing home clonopin 1mg  qid and recommend for outpatient psychiatry follow up.    Diet: Regular Fluids: NS  DVT Prophylaxis: Heparin  Bowel regimen: Senokot Code status: FULL  Family communication: Sister, [name] updated via telephone   Prior to Admission Living Arrangement: Home Anticipated Discharge Location: Home w/HH Barriers to Discharge: Continued medical management  Dispo: Anticipated discharge in approximately 2-3 day(s).   Harvie Heck, MD  [facility] PGY2 07/06/2020, 2:37 PM Pager: [phone] After 5pm on weekdays and 1pm on weekends: On Call pager [phone]

## 2020-07-07 LAB — COMPREHENSIVE METABOLIC PANEL
ALT: 114 U/L — ABNORMAL HIGH (ref 0–44)
AST: 53 U/L — ABNORMAL HIGH (ref 15–41)
Albumin: 2.7 g/dL — ABNORMAL LOW (ref 3.5–5.0)
Alkaline Phosphatase: 46 U/L (ref 38–126)
Anion gap: 8 (ref 5–15)
BUN: 30 mg/dL — ABNORMAL HIGH (ref 6–20)
CO2: 37 mmol/L — ABNORMAL HIGH (ref 22–32)
Calcium: 8.9 mg/dL (ref 8.9–10.3)
Chloride: 97 mmol/L — ABNORMAL LOW (ref 98–111)
Creatinine, Ser: 5.35 mg/dL — ABNORMAL HIGH (ref 0.44–1.00)
GFR, Estimated: 10 mL/min — ABNORMAL LOW (ref >60)
Glucose, Bld: 94 mg/dL (ref 70–99)
Potassium: 4 mmol/L (ref 3.5–5.1)
Sodium: 142 mmol/L (ref 135–145)
Total Bilirubin: 0.8 mg/dL (ref 0.3–1.2)
Total Protein: 5.5 g/dL — ABNORMAL LOW (ref 6.5–8.1)

## 2020-07-07 LAB — MAGNESIUM: Magnesium: 2.1 mg/dL (ref 1.7–2.4)

## 2020-07-07 LAB — PHOSPHORUS: Phosphorus: 5.5 mg/dL — ABNORMAL HIGH (ref 2.5–4.6)

## 2020-07-07 LAB — CBC
HCT: 40.9 % (ref 36.0–46.0)
Hemoglobin: 13.5 g/dL (ref 12.0–15.0)
MCH: 29.8 pg (ref 26.0–34.0)
MCHC: 33 g/dL (ref 30.0–36.0)
MCV: 90.3 fL (ref 80.0–100.0)
Platelets: 172 10*3/uL (ref 150–400)
RBC: 4.53 MIL/uL (ref 3.87–5.11)
RDW: 13.6 % (ref 11.5–15.5)
WBC: 8.3 10*3/uL (ref 4.0–10.5)
nRBC: 0 % (ref 0.0–0.2)

## 2020-07-07 MED ORDER — HYDROMORPHONE HCL 1 MG/ML IJ SOLN
0.5000 mg | INTRAMUSCULAR | Status: DC | PRN
  Administered 2020-07-07 (×2): 1 mg via INTRAVENOUS
  Filled 2020-07-07 (×2): qty 1

## 2020-07-07 MED ORDER — SENNOSIDES-DOCUSATE SODIUM 8.6-50 MG PO TABS
2.0000 | ORAL_TABLET | Freq: Every day | ORAL | Status: DC
  Administered 2020-07-07 – 2020-07-09 (×2): 2 via ORAL
  Filled 2020-07-07 (×3): qty 2

## 2020-07-07 MED ORDER — HYDROMORPHONE HCL 1 MG/ML IJ SOLN
0.5000 mg | INTRAMUSCULAR | Status: DC | PRN
Start: 2020-07-07 — End: 2020-07-08
  Administered 2020-07-07 – 2020-07-08 (×4): 0.5 mg via INTRAVENOUS
  Filled 2020-07-07 (×4): qty 1

## 2020-07-07 NOTE — Plan of Care (Signed)
  Problem: Education: Goal: Knowledge of General Education information will improve Description Including pain rating scale, medication(s)/side effects and non-pharmacologic comfort measures Outcome: Progressing   

## 2020-07-07 NOTE — Progress Notes (Signed)
Physical Therapy Treatment Patient Details Name: Carmen Daniels MRN: [id number] DOB: 05/21/1985 Today's Date: 07/07/2020    History of Present Illness 35 y.o. female admitted 06/29/20 with c/o weakness and syncope; pt reports dizziness and collapse on 5/6, was down for ~8 hrs then able to crawl to bedroom and has been in bed for 2 days prior to ED admit. Workup for ARF, rhabdomyolysis. PMH includes fibromyalgia, bipolar disorder, OCD, schizophrenia, asthma.    PT Comments    Pt is asleep on entry, wakes easily and requests pain medication. RN reports she has just had pain medication in last 30 minutes. Pt reports she has trouble keeping up when she sleeps. Pt reports she has not been out of bed all weekend. Advised pt she needs to advocate for herself for mobility. Pt with increased calf pain today due to increased plantarflexion and no attempt at exercise over weekend. Pt continues to be self limiting, and safe mobility is also limited by pain, decreased LE ROM and increasing weakness. Pt is supervision for bed mobility min guard for transfers and min A for steadying with 2 x12 feet ambulation with RW. Despite pt wishes to return to bed had her sit up in recliner and educated on calf AAROM stretching of calves with sheet. PT continues to recommend SNF level rehab to progress mobility before going home. PT will continue to follow acutely.    Follow Up Recommendations  SNF     Equipment Recommendations  Rolling walker with 5" wheels       Precautions / Restrictions Precautions Precautions: Fall;Other (comment) Precaution Comments: bilateral foot drop (pt reports bilateral foot in-toeing is baseline) Required Braces or Orthoses: Other Brace Other Brace: PRAFO bilateral Restrictions Weight Bearing Restrictions: No    Mobility  Bed Mobility Overal bed mobility: Modified Independent;Needs Assistance;Independent Bed Mobility: Supine to Sit;Sit to Supine     Supine to sit: Supervision      General bed mobility comments: Increased time and grimace    Transfers Overall transfer level: Needs assistance Equipment used: Rolling walker (2 wheeled) Transfers: Sit to/from Stand Sit to Stand: Min guard         General transfer comment: Min guard for safety initially from elevated bed, increased calf pain with standing due to achilles tightness, able to transfer to and from [facility]  Ambulation/Gait Ambulation/Gait assistance: Min guard;Min assist Gait Distance (Feet): 12 Feet (2x12) Assistive device: Rolling walker (2 wheeled) Gait Pattern/deviations: Step-through pattern Gait velocity: decreased Gait velocity interpretation: <1.31 ft/sec, indicative of household ambulator General Gait Details: 2 bouts of ambulation with min A to min guard for safety continuous cuing for sequencing and awareness of dorsiflexion and ankle eversion, continues to be self limiting         Balance Overall balance assessment: Needs assistance Sitting-balance support: No upper extremity supported;Feet supported Sitting balance-Leahy Scale: Fair     Standing balance support: Bilateral upper extremity supported;During functional activity;Single extremity supported Standing balance-Leahy Scale: Poor Standing balance comment: reliant on UE support                            Cognition Arousal/Alertness: Awake/alert Behavior During Therapy: Flat affect Overall Cognitive Status: No family/caregiver present to determine baseline cognitive functioning Area of Impairment: Following commands;Problem solving;Awareness;Safety/judgement;Attention                   Current Attention Level: Sustained   Following Commands: Follows one step commands with increased time  Safety/Judgement: Decreased awareness of safety;Decreased awareness of deficits Awareness: Intellectual Problem Solving: Slow processing;Requires verbal cues General Comments: pt did not get out of bed all weekend and has  difficulty understanding the lack of movement is detering her recovery      Exercises Other Exercises Other Exercises: sheet assisted calf stretches    General Comments General comments (skin integrity, edema, etc.): VSS on RA      Pertinent Vitals/Pain Pain Assessment: Faces Faces Pain Scale: Hurts a little bit Pain Location: bilateral calves Pain Descriptors / Indicators: Grimacing;Guarding Pain Intervention(s): Limited activity within patient's tolerance;Monitored during session;Premedicated before session;Repositioned           PT Goals (current goals can now be found in the care plan section) Acute Rehab PT Goals Patient Stated Goal: for pain to decrease PT Goal Formulation: With patient Time For Goal Achievement: 07/15/20 Potential to Achieve Goals: Fair Progress towards PT goals: Progressing toward goals    Frequency    Min 3X/week      PT Plan Discharge plan needs to be updated       AM-PAC PT "6 Clicks" Mobility   Outcome Measure  Help needed turning from your back to your side while in a flat bed without using bedrails?: None Help needed moving from lying on your back to sitting on the side of a flat bed without using bedrails?: None Help needed moving to and from a bed to a chair (including a wheelchair)?: A Little Help needed standing up from a chair using your arms (e.g., wheelchair or bedside chair)?: A Little Help needed to walk in hospital room?: A Little Help needed climbing 3-5 steps with a railing? : A Lot 6 Click Score: 19    End of Session Equipment Utilized During Treatment: Gait belt Activity Tolerance: Patient limited by pain Patient left: with call bell/phone within reach;in chair Nurse Communication: Mobility status PT Visit Diagnosis: Other abnormalities of gait and mobility (R26.89);Muscle weakness (generalized) (M62.81)     Time: 7517-0017 PT Time Calculation (min) (ACUTE ONLY): 29 min  Charges:  $Gait Training: 8-22  mins $Therapeutic Exercise: 8-22 mins                     Taleyah Hillman B. Migdalia Dk PT, DPT Acute Rehabilitation Services Pager [phone] Office [phone]    [place] 07/07/2020, 3:31 PM

## 2020-07-07 NOTE — Progress Notes (Signed)
Subjective:  HD7  Patient evaluated at bedside this AM. Reports she is doing okay, but still having significant bilateral lower extremity pain. Otherwise was able to eat some food that her mother brought her. Denies nausea, vomiting, abdominal pain. Does mention she still hasn't had a bowel movement. Discussed improved renal function, hopeful for continued diuresis.   Objective:  Vital signs in last 24 hours: Vitals:   07/06/20 2046 07/06/20 2350 07/07/20 0346 07/07/20 0500  BP: 108/85 (!) 127/92 (!) 132/98   Pulse: 96 97 96   Resp: 15 15 16    Temp: 98.1 F (36.7 C) 98.7 F (37.1 C) 98 F (36.7 C)   TempSrc: Oral Oral Oral   SpO2: 95% 97% 97%   Weight:    92.9 kg  Height:       Physical Exam: General: Laying in bed, no acute distress CV: Regular rate, rhythm. No m/r/g appreciated Pulm: Normal work of breathing on room air Abdomen: Soft, non-tender, non-distended. Decreased bowel sounds. MSK: Non-pitting edema and tenderness bilateral lower extremities.  CBC Latest Ref Rng & Units 07/07/2020 07/06/2020 07/05/2020  WBC 4.0 - 10.5 K/uL 8.3 5.8 6.7  Hemoglobin 12.0 - 15.0 g/dL 13.5 12.8 12.4  Hematocrit 36.0 - 46.0 % 40.9 38.5 36.0  Platelets 150 - 400 K/uL 172 141(L) 123(L)   BMP Latest Ref Rng & Units 07/07/2020 07/06/2020 07/05/2020  Glucose 70 - 99 mg/dL 94 98 79  BUN 6 - 20 mg/dL 30(H) 36(H) 38(H)  Creatinine 0.44 - 1.00 mg/dL 5.35(H) 7.10(H) 8.32(H)  Sodium 135 - 145 mmol/L 142 139 135  Potassium 3.5 - 5.1 mmol/L 4.0 2.9(L) 3.2(L)  Chloride 98 - 111 mmol/L 97(L) 84(L) 77(L)  CO2 22 - 32 mmol/L 37(H) 44(H) 42(H)  Calcium 8.9 - 10.3 mg/dL 8.9 8.5(L) 7.6(L)   Assessment/Plan: Carmen Daniels is 35yo person living with fibromyalgia, migraines, and multiple psychiatric disorders including bipolar 1 disorder, anxiety, depression admitted 5/8 with rhabdomyolysis with acute renal failure and liver injury, now with improved renal function.  Active Problems:   Acute renal failure  (HCC)   Malnutrition of moderate degree  #Rhabdomyolysis #Acute renal failure Renal function continues to improve with adequate UOP (4.6L over last 24h). sCr 5.35<7.1. Overall lab work improving with improved renal function. Potassium normalized this morning after significant hypokalemia previously noted yesterday. She continues to endorse bilateral lower extremity pain. Given her liver function will hold off Tylenol for now. Will need to gradually decrease opiate medication. There was a concern yesterday regarding respiratory depression and overall drowsiness d/t dilaudid, will decrease IV dosage today. - NS 75cc/hr - Flexeril 5mg  TID PRN - PO dilaudid 2mg  q8h + IV dilaudid 0.5mg  q2h PRN - Daily CMP, phos - Strict I/O - PT/OT  #Acute liver injury #Hyperbilirubinemia LFT's continue to improve with IVF. Most likely hyperbilirubinemia 2/2 starvation. Previously had thrombocytopenia, likely dilutional. No signs of hemolysis, previous lab work benign. Smear without schistocytes. Will continue to monitor LFT's. - Daily CMP   #Moderate malnourishment  Patient was able to eat some of the food brought in by her mother yesterday. Reports she has minimal appetite due to the significant amount of pain she is in. Appreciate RD assistance. Will continue to encourage po intake. Plan on consulting psychiatry as well for any additional input. - Daily multivitamin - Outpatient weight management  #Psychiatric disorders Patient previously on Abilify, but stopped this medication by herself. She then had a very restricted diet of 1/2 cup water for a restrictive diet.  While hospitalized states she has no appetite due to pain despite improvement of renal function. While she likely is still having pain, wonder if there is a psychogenic component to her decreased appetite. Will continue with home clonazepam, consult psychiatry for any additional recommendations.  - Psychiatry consult - Klonipin 1mg   QID  #Constipation Patient has not had BM since hospitalized, although has barely had any po intake. Will plan on increasing scheduled Senokot-S. - Senokot-S 2 tablets QHS  DIET: Regular IVF: NS 75/hr DVT PPX: heparin BOWEL: Senokot-S CODE: FULL FAM COM: Will discuss with sister [name]  Prior to Admission Living Arrangement:  Home Anticipated Discharge Location: SNF per PT/OT Barriers to Discharge: medical management Dispo: Anticipated discharge in approximately 3-4 day(s).   [name], MD 07/07/2020, 7:05 AM Pager: [phone] After 5pm on weekdays and 1pm on weekends: On Call pager [phone]

## 2020-07-07 NOTE — Progress Notes (Signed)
Subjective: Patient feels fine today.  Continues to have good urine output.  Encouraged her to drink fluids.   Objective Vital signs in last 24 hours: Vitals:   07/06/20 2350 07/07/20 0346 07/07/20 0500 07/07/20 0738  BP: (!) 127/92 (!) 132/98  (!) 129/96  Pulse: 97 96  96  Resp: 15 16  16   Temp: 98.7 F (37.1 C) 98 F (36.7 C)  98.9 F (37.2 C)  TempSrc: Oral Oral  Oral  SpO2: 97% 97%  96%  Weight:   92.9 kg   Height:       Weight change:   Intake/Output Summary (Last 24 hours) at 07/07/2020 1503 Last data filed at 07/07/2020 1414 Gross per 24 hour  Intake 2388.03 ml  Output 3200 ml  Net -811.97 ml    Assessment/Plan: 35 year old WF with AKi due to rhabdomyolysis, improving CK, worsening creatinine, adequate urine output on lasix and IVFs   1.Renal- AKI secondary to rhabdomyolysis.  Creatinine significantly improving down to 5.3 today.  Urine output very good.  Continue fluids for now and likely stop tomorrow.  Likely will sign off if kidney function continues to improve  2. Hypertension/volume  -blood pressure and volume both improving with auto diuresis 3.  Hyperphosphatemia: Associated with renal failure and high cellular turnover.  Improving 4. Hypokalemia: Resolved with supplementation 5. Hypomagnesemia: Resolved with supplementation 6. Hypocalcemia: Resolved with improving clinical disease 7. Dispo-follow-up creatinine tomorrow and likely able to discharge from our standpoint if still improving.  We will consider arranging follow-up based on labs tomorrow   Reesa Chew          Labs: Basic Metabolic Panel: Recent Labs  Lab 07/05/20 0421 07/06/20 0338 07/07/20 0330  NA 135 139 142  K 3.2* 2.9* 4.0  CL 77* 84* 97*  CO2 42* 44* 37*  GLUCOSE 79 98 94  BUN 38* 36* 30*  CREATININE 8.32* 7.10* 5.35*  CALCIUM 7.6* 8.5* 8.9  PHOS 6.7* 6.5* 5.5*   Liver Function Tests: Recent Labs  Lab 07/05/20 0421 07/06/20 0338 07/07/20 0330  AST 85* 72* 53*   ALT 160* 142* 114*  ALKPHOS 47 51 46  BILITOT 2.3* 1.0 0.8  PROT 4.8* 5.2* 5.5*  ALBUMIN 2.3* 2.5* 2.7*   No results for input(s): LIPASE, AMYLASE in the last 168 hours. No results for input(s): AMMONIA in the last 168 hours. CBC: Recent Labs  Lab 07/03/20 0340 07/04/20 0425 07/05/20 0421 07/06/20 0338 07/07/20 0330  WBC 6.5 6.8 6.7 5.8 8.3  HGB 12.0 11.5* 12.4 12.8 13.5  HCT 34.5* 33.5* 36.0 38.5 40.9  MCV 84.8 85.9 87.0 89.1 90.3  PLT 137* 125* 123* 141* 172   Cardiac Enzymes: Recent Labs  Lab 07/01/20 0700 07/02/20 0446 07/03/20 0340 07/04/20 0425 07/05/20 0421  CKTOTAL 4,925* 3,386* 2,203* 1,859* 1,362*   CBG: No results for input(s): GLUCAP in the last 168 hours.  Iron Studies: No results for input(s): IRON, TIBC, TRANSFERRIN, FERRITIN in the last 72 hours. Studies/Results: No results found. Medications: Infusions: . sodium chloride 75 mL/hr at 07/07/20 0542    Scheduled Medications: . Chlorhexidine Gluconate Cloth  6 each Topical Daily  . clonazePAM  1 mg Oral QID  . heparin  5,000 Units Subcutaneous Q8H  . HYDROmorphone  2 mg Oral Q8H  . multivitamin with minerals  1 tablet Oral Daily  . nicotine  14 mg Transdermal Daily  . senna-docusate  2 tablet Oral QHS  . sodium chloride flush  10-40 mL Intracatheter  Q12H    Physical Exam: General: Female, lying in bed in no distress Heart: Regular rate and rhythm Lungs: Normal work of breathing, room air Abdomen: Soft, tenderness with light palpation, nondistended Extremities: Minimally edematous lower extremities, tender to palpation, no skin changes noted   07/07/2020,3:03 PM  LOS: 7 days

## 2020-07-07 NOTE — Progress Notes (Signed)
Attempted to complete psych evaluation. Patient is working with physical therapy at this time. Will attempt to reassess tomorrow.

## 2020-07-08 LAB — COMPREHENSIVE METABOLIC PANEL
ALT: 85 U/L — ABNORMAL HIGH (ref 0–44)
AST: 36 U/L (ref 15–41)
Albumin: 2.7 g/dL — ABNORMAL LOW (ref 3.5–5.0)
Alkaline Phosphatase: 43 U/L (ref 38–126)
Anion gap: 7 (ref 5–15)
BUN: 27 mg/dL — ABNORMAL HIGH (ref 6–20)
CO2: 29 mmol/L (ref 22–32)
Calcium: 8.5 mg/dL — ABNORMAL LOW (ref 8.9–10.3)
Chloride: 101 mmol/L (ref 98–111)
Creatinine, Ser: 3.9 mg/dL — ABNORMAL HIGH (ref 0.44–1.00)
GFR, Estimated: 15 mL/min — ABNORMAL LOW (ref >60)
Glucose, Bld: 95 mg/dL (ref 70–99)
Potassium: 4.2 mmol/L (ref 3.5–5.1)
Sodium: 137 mmol/L (ref 135–145)
Total Bilirubin: 1.1 mg/dL (ref 0.3–1.2)
Total Protein: 5.6 g/dL — ABNORMAL LOW (ref 6.5–8.1)

## 2020-07-08 LAB — MAGNESIUM: Magnesium: 1.9 mg/dL (ref 1.7–2.4)

## 2020-07-08 LAB — PHOSPHORUS: Phosphorus: 4.3 mg/dL (ref 2.5–4.6)

## 2020-07-08 MED ORDER — HYDROMORPHONE HCL 2 MG PO TABS
1.0000 mg | ORAL_TABLET | Freq: Three times a day (TID) | ORAL | Status: DC
  Administered 2020-07-08 – 2020-07-09 (×3): 1 mg via ORAL
  Filled 2020-07-08 (×3): qty 1

## 2020-07-08 MED ORDER — HYDROMORPHONE HCL 1 MG/ML IJ SOLN
0.5000 mg | INTRAMUSCULAR | Status: DC | PRN
  Administered 2020-07-08: 0.5 mg via INTRAVENOUS
  Filled 2020-07-08: qty 1

## 2020-07-08 MED ORDER — HYDROMORPHONE HCL 2 MG PO TABS
1.0000 mg | ORAL_TABLET | Freq: Once | ORAL | Status: AC
Start: 2020-07-09 — End: 2020-07-08
  Administered 2020-07-08: 1 mg via ORAL
  Filled 2020-07-08: qty 1

## 2020-07-08 MED ORDER — GABAPENTIN 600 MG PO TABS
300.0000 mg | ORAL_TABLET | Freq: Three times a day (TID) | ORAL | Status: DC
  Administered 2020-07-08 – 2020-07-09 (×6): 300 mg via ORAL
  Filled 2020-07-08 (×6): qty 1

## 2020-07-08 NOTE — Progress Notes (Signed)
Overnight Note:  Paged by bedside RN for pt requesting to go back to her pain regimen from earlier today, including IV dilaudid. Explained that my day colleagues discontinued the IV dilaudid and is working on managing her pain with oral dilaudid. RN relayed this to the patient but she requested to speak to a provider.   I reviewed her medications prior to admission based on the PDMP. It appears that she has been receiving tramadol #90 per month from her provider at [facility]. Due to her kidney injury in the setting of rhabdomyolysis, this admission, tramadol was discontinued.   I evaluated the patient at bedside. On walking into the room, she was resting comfortably in bed, in no acute distress. She reports ongoing 10/10 pain in her lower extremities. It is unchanged from earlier today. Sensation is bilateral and greater in her feet than legs. When I asked her to describe the pain, she says it feels as though her legs and feet have fallen asleep and are numb. I inquired into this further, as this did not sound like a pain, but rather numbness. She was unable to characterize the pain but explicitly said that it is painful.   On exam, her she has mild non-pitting edema bilaterally. Calves are soft. LEs are warm bilaterally with intact peripheral pulses. Sensation to touch is intact as well.   Low suspicion for compartment syndrome at this time.  I suspect that, in light of her chronic pain and psychiatric illnesses, tramadol may be more effective for her. I explained to her that I will give her an extra 1mg  of dilaudid tonight.   Based on up to date recommendations, it does look like she could get started on a lower dose of IR tramadol with q12h dosing and maximum 200mg  in 24 hours. Will relay this to the day team in the morning.  Mitzi Hansen, MD Internal Medicine Resident PGY-2 [facility] Pager: [phone] 07/08/2020 10:30 PM

## 2020-07-08 NOTE — Telephone Encounter (Signed)
Patient's mother called to cancel patient's 5/18 Botox appt as she is currently in the hospital. Patient's mother advised she will call back when they are ready to r/s.

## 2020-07-08 NOTE — Plan of Care (Signed)
  Problem: Education: Goal: Knowledge of General Education information will improve Description Including pain rating scale, medication(s)/side effects and non-pharmacologic comfort measures Outcome: Progressing   

## 2020-07-08 NOTE — Progress Notes (Signed)
Subjective:  HD8  This morning reports sharp "knife-like" pains in bilateral legs from knee to feet.  Decreased appetite due to pain but is eating whatever her mother brings her. Wants to do [facility] PT/OT rather than SNF.   Objective:  Vital signs in last 24 hours: Vitals:   07/07/20 0500 07/07/20 0738 07/07/20 1700 07/07/20 2018  BP:  (!) 129/96 (!) 130/96 122/90  Pulse:  96 97 94  Resp:  16 16 16   Temp:  98.9 F (37.2 C) 98.9 F (37.2 C) 99.1 F (37.3 C)  TempSrc:  Oral Oral Oral  SpO2:  96% 97% 97%  Weight: 92.9 kg     Height:       Physical Exam: General: Laying in bed, no acute distress CV: Regular rate, rhythm. No m/r/g appreciated. Pulm: Normal work of breathing, clear to auscultation bilaterally. Abdomen: Soft, non-tender, non-distended. Normoactive bowel sounds MSK: Non-pitting edema bilaterally, improved from yesterday. Diffusely painful in BLE.   CBC Latest Ref Rng & Units 07/07/2020 07/06/2020 07/05/2020  WBC 4.0 - 10.5 K/uL 8.3 5.8 6.7  Hemoglobin 12.0 - 15.0 g/dL 13.5 12.8 12.4  Hematocrit 36.0 - 46.0 % 40.9 38.5 36.0  Platelets 150 - 400 K/uL 172 141(L) 123(L)   BMP Latest Ref Rng & Units 07/08/2020 07/07/2020 07/06/2020  Glucose 70 - 99 mg/dL 95 94 98  BUN 6 - 20 mg/dL 27(H) 30(H) 36(H)  Creatinine 0.44 - 1.00 mg/dL 3.90(H) 5.35(H) 7.10(H)  Sodium 135 - 145 mmol/L 137 142 139  Potassium 3.5 - 5.1 mmol/L 4.2 4.0 2.9(L)  Chloride 98 - 111 mmol/L 101 97(L) 84(L)  CO2 22 - 32 mmol/L 29 37(H) 44(H)  Calcium 8.9 - 10.3 mg/dL 8.5(L) 8.9 8.5(L)   Assessment/Plan: Carmen Daniels is 35yo person living with fibromyalgia, migraines, and multiple psychiatric disorders including bipolar 1 disorder, anxiety, depression admitted 5/8 with rhabdomyolysis with acute renal failure and liver injury, renal function improving.  Active Problems:   Acute renal failure (HCC)   Malnutrition of moderate degree  #Rhabdomyolysis #Acute renal failure Renal function continues to improve,  sCr 3.9<5.3. Will stop fluids today, continue to encourage po intake. Patient did have 3L UOP, looks markedly improved from a few days ago. Expect to continue to down trend as she recovers from likely ATN. Will plan for home health PT/OT. - D/c fluids, encourage po intake - D/c Foley - Daily CMP, phos - Strict I/O - PT/OT  #Neuropathic BLE pain Patient this morning describing shooting, sharp pains down her legs bilaterally. As this appears more neuropathic pain, will plan d/c IV opiate medications and start gabapentin. Will decrease po dilaudid today, hopeful to be able to stop tomorrow. - Gabapentin 300mg  TID - PO dilaudid 1mg  q8h - Flexeril 5mg  TID PRN  #Acute liver injury #Hyperbilirubinemia LFT's continuing to downtrend with IVF. Likely hyperbilirubinemia 2/2 starvation. Will continue to trend to ensure normalization as we stop fluids and encourage po intake.  - Daily CMP  #Moderate malnourishment Continue to encourage po intake. Patient does report she is a vegetarian and does not like the hospital food. Will continue to work with her regarding po intake. Likely will need further outpatient weight management counseling. - Daily multivitamin - Outpatient weight management  #Psychiatric disorders Seen by psychiatry this AM, appreciate recommendations. At this time it is not felt as the patient is exhibiting signs of intentional caloric restriction.  - F/u outpatient psychiatry - Klonipin 1mg  QID  DIET: Regular IVF: NS 75 cc/hr DVT PPX: heparin  BOWEL: Senokot-S CODE: FULL FAM COM: Will call sister [name]  Prior to Admission Living Arrangement: Home Anticipated Discharge Location: Home w/ HH Barriers to Discharge: medical management Dispo: Anticipated discharge in approximately 1-2 day(s).   [name], MD 07/08/2020, 6:12 AM Pager: [phone] After 5pm on weekdays and 1pm on weekends: On Call pager [phone]

## 2020-07-08 NOTE — Progress Notes (Signed)
Nutrition Follow-up  DOCUMENTATION CODES:   Non-severe (moderate) malnutrition in context of chronic illness  INTERVENTION:  -Continue Magic cup TID with meals, each supplement provides 290 kcal and 9 grams of protein -Continue snacks TID -Continue MVI daily  NUTRITION DIAGNOSIS:   Moderate Malnutrition related to chronic illness as evidenced by per patient/family report,percent weight loss,energy intake < or equal to 50% for > or equal to 5 days,mild muscle depletion,energy intake < or equal to 75% for > or equal to 1 month.  ongoing  GOAL:   Patient will meet greater than or equal to 90% of their needs  progressing  MONITOR:   PO intake,Supplement acceptance,Labs,Weight trends,Skin,I & O's  REASON FOR ASSESSMENT:   Consult Assessment of nutrition requirement/status,Diet education  ASSESSMENT:   35 yo female with a PMH of anxiety, bipolar 1 disorder, fibromyalgia, migraines, and OCD who presents with Rhabdomyolysis, AKI, and elevated LFTs.  MD notes rhabdo and renal function improving. Pt reports decreased appetite today 2/2 "kinfe-like" pain in her legs. Intake of hospital food remains limited, but pt continues to eat options brought in by her mother. Pt tolerating snacks/magic cup per RN. Recommend continuing current nutrition plan of care.   UOP: 3L x24 hours  Medications and labs reviewed.  Diet Order:   Diet Order            Diet regular Room service appropriate? Yes; Fluid consistency: Thin  Diet effective now                 EDUCATION NEEDS:   Education needs have been addressed  Skin:  Skin Assessment: Reviewed RN Assessment  Last BM:  5/16 type 5  Height:   Ht Readings from Last 1 Encounters:  06/30/20 5\' 11"  (2.993 m)    Weight:   Wt Readings from Last 1 Encounters:  07/07/20 92.9 kg    Ideal Body Weight:  70.5 kg  BMI:  Body mass index is 28.56 kg/m.  Estimated Nutritional Needs:   Kcal:  1950-2150  Protein:  95-110  grams  Fluid:  >2 L    Larkin Ina, MS, RD, LDN RD pager number and weekend/on-call pager number located in [place].

## 2020-07-08 NOTE — Consult Note (Signed)
[facility] Consult   Reason for Consult:  Carmen Daniels has been diagnosed with multiple psychiatric disorders in the past. She was previously on Abilify but stopped medication by herself, which likely resulted in this admission. She has had decreased po intake, previously had only been drinking 1/2 cup water daily in order to lose weight. Any input would be greatly appreciated! Referring Physician:  Dr. Daryll Drown Patient Identification: Carmen Daniels MRN:  [id number] Principal Diagnosis: Bipolar I disorder, most recent episode depressed ([place]) Diagnosis:  Principal Problem:   Bipolar I disorder, most recent episode depressed ([place]) Active Problems:   Borderline personality disorder ([place])   Adjustment disorder with mixed anxiety and depressed mood   Acute renal failure ([place])   Malnutrition of moderate degree   Total Time spent with patient: 45 minutes  Subjective:   Carmen Daniels is a 35 y.o. female patient admitted with decreased appetite and acute kideny injury.  Patient seen and assessed., chart reviewed.  SHe is pleasant, cooperative, and engaged in conversation and did not wish to maintain appropriate she was observed trying to go back to her side after providing introduced them to.  She to discontinuing her medications, and expressed much interest and starting ECT.  She reports that her mood has been unstable for quite some time, however she has had side effects to most of the medications and she does not wish to resume them at this time.  When assessing for eating disorders, patient is able to vocalize and refute any eating disorder behaviors to include intentional caloric restriction, binging, or purging, excessive exercise.  She states that while in the hospital she does not like the food, therefore her mother has been bringing her food.  She declines having a poor appetite at this time.  She also has a history of bipolar disorder, in which she currently denies, mood lability,  pressured speech, grandiose.  She also denies paranoia, psychosis, hallucinations.  She furthermore minimizes her depressive symptoms.  As previously noted she does not wish to resume her psychotropic medication as she feels her mood is stable, and she is interested and ECT.  She states she has tried and failed most psychotropic medications.  She denies suicidal ideations, homicidal ideations, and or auditory or visual hallucinations.  She answers all questions appropriately, and does not appear to be having any delusional thinking.    Patient is alert and oriented, calm and grudgingly cooperative.  Patient does not appear to be exhibiting any mood instability and emotional dysregulation at this time.  Patient does have a history of suicide attempts that have been interrupted, preparatory acts, self aborted.  She is currently receiving outpatient services through [facility], last office visit February 2022.  She is also receiving outpatient services at [facility], for migraine management.  HPI:  Carmen Daniels is a 35 year old woman with PMH of anxiety, bipolar 1 disorder, FM, migraines, OCD who presented for a syncopal episode.  She apparently self discontinued her aripiprazole due to not feeling well on it.  She has also been restricting her intake to very little fluid and almost no solid foods.  This was apparently in an attempt to lose weight.  Of Friday, she was feeling very dizzy and collapsed in her kitchen.  She was down for ~8 hours.  She was able to crawl to her bedroom and was in the bed for 2 days prior to coming into the ED.  She notes  weakness in the legs, pain all over.  She was noted to have an elevated CK, AKI consistent with rhabdomyolysis and was admitted to the hospital.    Past Psychiatric History: bipolar disorder, borderline personality disorder, anxiety, depression, and SI/SA  Risk to Self:   Risk to Others:   Prior  Inpatient Therapy:   Prior Outpatient Therapy:    Past Medical History:  Past Medical History:  Diagnosis Date  . Acute respiratory failure with hypoxia ([place])   . Acute rhinosinusitis 07/03/2018  . Allergic rhinitis   . Anxiety   . Asthma   . Back pain   . Bipolar disorder ([place])   . Borderline personality disorder ([place])   . Bupropion overdose   . Chronic pain   . Chronic pain syndrome   . Depression   . Fibromyalgia    generalized  . Hallucinations   . Migraines   . Nasal polyps   . Nausea   . Neck pain   . OCD (obsessive compulsive disorder)   . OCD (obsessive compulsive disorder)   . Ovarian cyst    left  . Pneumonia    2021  . PONV (postoperative nausea and vomiting)   . Schizophrenia ([place])    schizoaffective    Past Surgical History:  Procedure Laterality Date  . ABDOMINAL HYSTERECTOMY    . LAPAROSCOPIC TUBAL LIGATION Bilateral 06/13/2019   Procedure: LAPAROSCOPIC TUBAL LIGATION;  Surgeon: Chancy Milroy, MD;  Location: [facility];  Service: Gynecology;  Laterality: Bilateral;  FILSHIE CLIPS  . NASAL SINUS SURGERY    . TUBAL LIGATION    . tubes in ears    . VAGINAL HYSTERECTOMY Bilateral 01/15/2020   Procedure: HYSTERECTOMY VAGINAL;  Surgeon: Chancy Milroy, MD;  Location: [facility];  Service: Gynecology;  Laterality: Bilateral;  . WISDOM TOOTH EXTRACTION     Family History:  Family History  Problem Relation Age of Onset  . Healthy Father   . Healthy Mother   . Arthritis Maternal Grandmother   . Arthritis Maternal Grandfather   . Depression Paternal Grandmother    Family Psychiatric  History: Paternal grandmother depression Social History:  Social History   Substance and Sexual Activity  Alcohol Use Never     Social History   Substance and Sexual Activity  Drug Use No    Social History   Socioeconomic History  . Marital status: Married    Spouse name: Not on file  . Number of children: 0  . Years of education: 27  . Highest  education level: Not on file  Occupational History  . Occupation: Unemployed  Tobacco Use  . Smoking status: Current Every Day Smoker    Packs/day: 0.50    Years: 15.00    Pack years: 7.50    Types: Cigarettes  . Smokeless tobacco: Never Used  Vaping Use  . Vaping Use: Every day  . Substances: Nicotine  Substance and Sexual Activity  . Alcohol use: Never  . Drug use: No  . Sexual activity: Not on file  Other Topics Concern  . Not on file  Social History Narrative   Lives at home alone.   Right-handed.   3 glasses of green tea per day.   Social Determinants of Health   Financial Resource Strain: Not on file  Food Insecurity: Not on file  Transportation Needs: Not on file  Physical Activity: Not on file  Stress: Not on file  Social Connections: Not on file   Additional Social History:  Allergies:   Allergies  Allergen Reactions  . Keflex [Cephalexin] Anaphylaxis    Labs:  Results for orders placed or performed during the hospital encounter of 06/29/20 (from the past 48 hour(s))  Phosphorus     Status: Abnormal   Collection Time: 07/07/20  3:30 AM  Result Value Ref Range   Phosphorus 5.5 (H) 2.5 - 4.6 mg/dL    Comment: Performed at [facility], [place] [address] [place], [place] [address]  CBC     Status: None   Collection Time: 07/07/20  3:30 AM  Result Value Ref Range   WBC 8.3 4.0 - 10.5 K/uL   RBC 4.53 3.87 - 5.11 MIL/uL   Hemoglobin 13.5 12.0 - 15.0 g/dL   HCT 40.9 36.0 - 46.0 %   MCV 90.3 80.0 - 100.0 fL   MCH 29.8 26.0 - 34.0 pg   MCHC 33.0 30.0 - 36.0 g/dL   RDW 13.6 11.5 - 15.5 %   Platelets 172 150 - 400 K/uL   nRBC 0.0 0.0 - 0.2 %    Comment: Performed at [facility], [place] [address], [place], [place] [address]  Comprehensive metabolic panel     Status: Abnormal   Collection Time: 07/07/20  3:30 AM  Result Value Ref Range   Sodium 142 135 - 145 mmol/L   Potassium 4.0 3.5 - 5.1 mmol/L    Comment: DELTA CHECK NOTED NO VISIBLE  HEMOLYSIS    Chloride 97 (L) 98 - 111 mmol/L   CO2 37 (H) 22 - 32 mmol/L   Glucose, Bld 94 70 - 99 mg/dL    Comment: Glucose reference range applies only to samples taken after fasting for at least 8 hours.   BUN 30 (H) 6 - 20 mg/dL   Creatinine, Ser 5.35 (H) 0.44 - 1.00 mg/dL   Calcium 8.9 8.9 - 10.3 mg/dL   Total Protein 5.5 (L) 6.5 - 8.1 g/dL   Albumin 2.7 (L) 3.5 - 5.0 g/dL   AST 53 (H) 15 - 41 U/L   ALT 114 (H) 0 - 44 U/L   Alkaline Phosphatase 46 38 - 126 U/L   Total Bilirubin 0.8 0.3 - 1.2 mg/dL   GFR, Estimated 10 (L) >60 mL/min    Comment: (NOTE) Calculated using the CKD-EPI Creatinine Equation (2021)    Anion gap 8 5 - 15    Comment: Performed at [facility], [place] [address] [place], [place] [address]  Magnesium     Status: None   Collection Time: 07/07/20  3:30 AM  Result Value Ref Range   Magnesium 2.1 1.7 - 2.4 mg/dL    Comment: Performed at [facility] [address] [place], [place] [address]  Phosphorus     Status: None   Collection Time: 07/08/20  3:54 AM  Result Value Ref Range   Phosphorus 4.3 2.5 - 4.6 mg/dL    Comment: Performed at [facility] [address] [place], [place] [address]  Comprehensive metabolic panel     Status: Abnormal   Collection Time: 07/08/20  3:54 AM  Result Value Ref Range   Sodium 137 135 - 145 mmol/L   Potassium 4.2 3.5 - 5.1 mmol/L   Chloride 101 98 - 111 mmol/L   CO2 29 22 - 32 mmol/L   Glucose, Bld 95 70 - 99 mg/dL    Comment: Glucose reference range applies only to samples taken after fasting for at least 8 hours.  BUN 27 (H) 6 - 20 mg/dL   Creatinine, Ser 3.90 (H) 0.44 - 1.00 mg/dL   Calcium 8.5 (L) 8.9 - 10.3 mg/dL   Total Protein 5.6 (L) 6.5 - 8.1 g/dL   Albumin 2.7 (L) 3.5 - 5.0 g/dL   AST 36 15 - 41 U/L   ALT 85 (H) 0 - 44 U/L   Alkaline Phosphatase 43 38 - 126 U/L   Total Bilirubin 1.1 0.3 - 1.2 mg/dL   GFR, Estimated 15 (L) >60 mL/min    Comment: (NOTE) Calculated using the CKD-EPI  Creatinine Equation (2021)    Anion gap 7 5 - 15    Comment: Performed at [facility], [place] [address] [place], [place] [address]  Magnesium     Status: None   Collection Time: 07/08/20  3:54 AM  Result Value Ref Range   Magnesium 1.9 1.7 - 2.4 mg/dL    Comment: Performed at [facility] [address], [place], [place] [address]    Current Facility-Administered Medications  Medication Dose Route Frequency Provider Last Rate Last Admin  . Chlorhexidine Gluconate Cloth 2 % PADS 6 each  6 each Topical Daily Sid Falcon, MD   6 each at 07/07/20 1053  . clonazePAM (KLONOPIN) tablet 1 mg  1 mg Oral QID [name], MD   1 mg at 07/08/20 0948  . cyclobenzaprine (FLEXERIL) tablet 5 mg  5 mg Oral TID PRN [name], MD   5 mg at 07/07/20 2206  . gabapentin (NEURONTIN) tablet 300 mg  300 mg Oral TID [name], MD      . heparin injection 5,000 Units  5,000 Units Subcutaneous Q8H Seawell, Jaimie A, DO   5,000 Units at 07/08/20 0643  . hydrocortisone cream 1 % 1 application  1 application Topical TID PRN Sid Falcon, MD   1 application at [phone]  . HYDROmorphone (DILAUDID) tablet 2 mg  2 mg Oral Q8H [name], MD   2 mg at 07/08/20 [phone]  . multivitamin with minerals tablet 1 tablet  1 tablet Oral Daily Sid Falcon, MD   1 tablet at 07/08/20 0948  . nicotine (NICODERM CQ - dosed in mg/24 hours) patch 14 mg  14 mg Transdermal Daily Cato Mulligan, MD   14 mg at 07/07/20 1051  . ondansetron (ZOFRAN) tablet 4 mg  4 mg Oral Q6H PRN Seawell, Jaimie A, DO       Or  . ondansetron (ZOFRAN) injection 4 mg  4 mg Intravenous Q6H PRN Seawell, Jaimie A, DO   4 mg at 07/02/20 0105  . senna-docusate (Senokot-S) tablet 2 tablet  2 tablet Oral Andee Poles, MD   2 tablet at 07/07/20 2206  . sodium chloride flush (NS) 0.9 % injection 10-40 mL  10-40 mL Intracatheter Q12H Sid Falcon, MD   10 mL at 07/06/20 2111  . sodium chloride flush (NS)  0.9 % injection 10-40 mL  10-40 mL Intracatheter PRN Sid Falcon, MD   10 mL at 07/04/20 0431  . SUMAtriptan (IMITREX) tablet 100 mg  100 mg Oral Q2H PRN [name], MD   100 mg at 07/05/20 1840    Musculoskeletal: Strength & Muscle Tone: within normal limits Gait & Station: normal Patient leans: N/A            Psychiatric Specialty Exam:  Presentation  General Appearance: Disheveled; Appropriate for Environment  Eye Contact:Minimal  Speech:Clear and Coherent; Normal Rate  Speech  Volume:Normal  Handedness:Right   Mood and Affect  Mood:Irritable  Affect:Congruent; Blunt   Thought Process  Thought Processes:Coherent; Linear  Descriptions of Associations:Tangential  Orientation:Full (Time, Place and Person)  Thought Content:Logical  History of Schizophrenia/Schizoaffective disorder:No data recorded Duration of Psychotic Symptoms:No data recorded Hallucinations:Hallucinations: None  Ideas of Reference:None  Suicidal Thoughts:Suicidal Thoughts: No  Homicidal Thoughts:Homicidal Thoughts: No   Sensorium  Memory:Immediate Fair; Recent Fair; Remote Fair  Judgment:Fair  Insight:Shallow   Executive Functions  Concentration:Fair  Attention Span:Fair  [place]   Psychomotor Activity  Psychomotor Activity:Psychomotor Activity: Normal   Assets  Assets:Communication Skills; Physical Health; Resilience; Social Support; Housing; Catering manager; Desire for Improvement   Sleep  Sleep:Sleep: Poor   Physical Exam: Physical Exam Vitals and nursing note reviewed.  Constitutional:      Appearance: Normal appearance. She is normal weight.  HENT:     Head: Normocephalic.  Neurological:     General: No focal deficit present.     Mental Status: She is alert and oriented to person, place, and time. Mental status is at baseline.  Psychiatric:        Mood and Affect: Mood normal.         Behavior: Behavior normal.        Thought Content: Thought content normal.        Judgment: Judgment normal.    Review of Systems  Constitutional: Positive for weight loss.  Psychiatric/Behavioral: Negative for depression, hallucinations, memory loss, substance abuse and suicidal ideas. The patient is not nervous/anxious and does not have insomnia.   All other systems reviewed and are negative.  Blood pressure 127/79, pulse 96, temperature 98.2 F (36.8 C), temperature source Oral, resp. rate 18, height 5\' 11"  (1.803 m), weight 92.9 kg, SpO2 99 %. Body mass index is 28.56 kg/m.   Patient with previous psychiatric diagnosis of bipolar 1 disorder, borderline personality disorder, depression and anxiety.  She was recently started on Abilify in February for mood stabilization.  She patient reports she discontinued her medication, however states her mood is stable.  Patient has previously discontinue her medications, due to concerns regarding weight gain.  Psychotropic medication particularly antipsychotics, are associated with weight gain which appears to be her primary concern.  She denies any current anxiety, depressive symptoms, paranoia or psychosis.  She notes that she does not want to restart her psychotropic medications at this time.  She does appear to express interest in ECT, noting her multiple failed attempts and most psychotropic medications.  At the time of this evaluation patient does appear to be psychiatrically stable, chart review does not indicate any disruptive behaviors, intentional caloric restriction intake, and or suicidal thoughts or intentions.  Patient did not appear to benefit from inpatient hospitalization.   Treatment Plan Summary: Plan See below  -Patient continues to be amenable to outside food, reports that she is a vegetarian.  Will recommend continuing to encourage her to eat hospital food in order to appropriately monitor her intake and output.  May benefit  from hospital menu, in which she can choose items to her liking. -As noted above patient does not appear to be exhibiting acute psychiatric symptoms, and or evident in intentional caloric restriction which would warrant inpatient hospitalization. -Recommend referral to intensive outpatient programming for coping skills management, and behavior modifications.  -Consider referral to ECT provider, for evaluation.  -In the event patient wishes to resume psychotropic medication, consider adding metformin and or GLP-1 to counter assist  with headaches from psychotropic medication.  This may help with future compliance and adherence. Disposition: No evidence of imminent risk to self or others at present.   Patient does not meet criteria for psychiatric inpatient admission. Supportive therapy provided about ongoing stressors. Refer to IOP. Discussed crisis plan, support from social network, calling 911, coming to the Emergency Department, and calling Suicide Hotline.  Suella Broad, FNP 07/08/2020 10:06 AM

## 2020-07-08 NOTE — Progress Notes (Signed)
This patient's plan of care was discussed with the house staff. Please see their note for complete details. I concur with their findings.  Rhabdo appears to be improving. Need to get pt off IV pain rx. Consider rx for neuropathic pain (neurontin, lyrica).  Appreciate psych f/u. D/l pt restarting abilify.

## 2020-07-08 NOTE — Progress Notes (Signed)
Subjective: Patient having pain in her lower extremities.  Continues to have good urine output.  Drinking water without issue.  Creatinine improving.   Objective Vital signs in last 24 hours: Vitals:   07/07/20 1700 07/07/20 2018 07/08/20 0700 07/08/20 0946  BP: (!) 130/96 122/90 123/87 127/79  Pulse: 97 94 97 96  Resp: 16 16 16 18   Temp: 98.9 F (37.2 C) 99.1 F (37.3 C) 98.7 F (37.1 C) 98.2 F (36.8 C)  TempSrc: Oral Oral Oral Oral  SpO2: 97% 97% 93% 99%  Weight:      Height:       Weight change:   Intake/Output Summary (Last 24 hours) at 07/08/2020 1029 Last data filed at 07/08/2020 6712 Gross per 24 hour  Intake 3020.57 ml  Output 3150 ml  Net -129.43 ml    Assessment/Plan: 35 year old WF with AKi due to rhabdomyolysis, improving CK, worsening creatinine, adequate urine output on lasix and IVFs   1.Renal- AKI secondary to rhabdomyolysis.  Creatinine now significantly improved down to 3.9.  Likely will continue to improve with time down to her baseline.  Encourage oral hydration.  Can recheck labs in 3 to 5 days.  Can follow-up with primary care provider to monitor kidney function.  If creatinine fails to improve to normal we can arrange follow-up in the outpatient setting.  Given her improvement we will sign off at this time.  2. Hypertension/volume  -blood pressure and volume both improving with auto diuresis 3.  Hyperphosphatemia/hypokalemia/hypomagnesemia/hypocalcemia: All improved with improving clinical course 7. Dispo-we will sign off at this time given significant improvement.  Plans for follow-up as above   Reesa Chew          Labs: Basic Metabolic Panel: Recent Labs  Lab 07/06/20 0338 07/07/20 0330 07/08/20 0354  NA 139 142 137  K 2.9* 4.0 4.2  CL 84* 97* 101  CO2 44* 37* 29  GLUCOSE 98 94 95  BUN 36* 30* 27*  CREATININE 7.10* 5.35* 3.90*  CALCIUM 8.5* 8.9 8.5*  PHOS 6.5* 5.5* 4.3   Liver Function Tests: Recent Labs  Lab  07/06/20 0338 07/07/20 0330 07/08/20 0354  AST 72* 53* 36  ALT 142* 114* 85*  ALKPHOS 51 46 43  BILITOT 1.0 0.8 1.1  PROT 5.2* 5.5* 5.6*  ALBUMIN 2.5* 2.7* 2.7*   No results for input(s): LIPASE, AMYLASE in the last 168 hours. No results for input(s): AMMONIA in the last 168 hours. CBC: Recent Labs  Lab 07/03/20 0340 07/04/20 0425 07/05/20 0421 07/06/20 0338 07/07/20 0330  WBC 6.5 6.8 6.7 5.8 8.3  HGB 12.0 11.5* 12.4 12.8 13.5  HCT 34.5* 33.5* 36.0 38.5 40.9  MCV 84.8 85.9 87.0 89.1 90.3  PLT 137* 125* 123* 141* 172   Cardiac Enzymes: Recent Labs  Lab 07/02/20 0446 07/03/20 0340 07/04/20 0425 07/05/20 0421  CKTOTAL 3,386* 2,203* 1,859* 1,362*   CBG: No results for input(s): GLUCAP in the last 168 hours.  Iron Studies: No results for input(s): IRON, TIBC, TRANSFERRIN, FERRITIN in the last 72 hours. Studies/Results: No results found. Medications: Infusions:   Scheduled Medications: . Chlorhexidine Gluconate Cloth  6 each Topical Daily  . clonazePAM  1 mg Oral QID  . gabapentin  300 mg Oral TID  . heparin  5,000 Units Subcutaneous Q8H  . HYDROmorphone  2 mg Oral Q8H  . multivitamin with minerals  1 tablet Oral Daily  . nicotine  14 mg Transdermal Daily  . senna-docusate  2 tablet Oral  QHS  . sodium chloride flush  10-40 mL Intracatheter Q12H    Physical Exam: General: Female, lying in bed in no distress Heart: Regular rate and rhythm Lungs: Normal work of breathing, room air Abdomen: Soft, tenderness with light palpation, nondistended Extremities: Minimally edematous lower extremities, tender to palpation, no skin changes noted   07/08/2020,10:29 AM  LOS: 8 days

## 2020-07-08 NOTE — Progress Notes (Signed)
Attempted to see pt x2 today. Pt refused on both occasions despite having pain medicine saying legs hurt too much. Despite much encouragement, pt continued to decline. Will attempt back as schedule allows.  Jinger Neighbors, [place] [phone]

## 2020-07-09 ENCOUNTER — Ambulatory Visit: Payer: Medicaid Other | Admitting: Neurology

## 2020-07-09 LAB — COMPREHENSIVE METABOLIC PANEL
ALT: 68 U/L — ABNORMAL HIGH (ref 0–44)
AST: 31 U/L (ref 15–41)
Albumin: 3 g/dL — ABNORMAL LOW (ref 3.5–5.0)
Alkaline Phosphatase: 42 U/L (ref 38–126)
Anion gap: 7 (ref 5–15)
BUN: 25 mg/dL — ABNORMAL HIGH (ref 6–20)
CO2: 26 mmol/L (ref 22–32)
Calcium: 9.1 mg/dL (ref 8.9–10.3)
Chloride: 105 mmol/L (ref 98–111)
Creatinine, Ser: 2.5 mg/dL — ABNORMAL HIGH (ref 0.44–1.00)
GFR, Estimated: 25 mL/min — ABNORMAL LOW (ref >60)
Glucose, Bld: 93 mg/dL (ref 70–99)
Potassium: 3.9 mmol/L (ref 3.5–5.1)
Sodium: 138 mmol/L (ref 135–145)
Total Bilirubin: 1.1 mg/dL (ref 0.3–1.2)
Total Protein: 6.1 g/dL — ABNORMAL LOW (ref 6.5–8.1)

## 2020-07-09 LAB — MAGNESIUM: Magnesium: 1.7 mg/dL (ref 1.7–2.4)

## 2020-07-09 LAB — PHOSPHORUS: Phosphorus: 3.3 mg/dL (ref 2.5–4.6)

## 2020-07-09 MED ORDER — TRAMADOL HCL 50 MG PO TABS
50.0000 mg | ORAL_TABLET | Freq: Two times a day (BID) | ORAL | Status: DC | PRN
  Administered 2020-07-09 (×2): 50 mg via ORAL
  Filled 2020-07-09 (×3): qty 1

## 2020-07-09 MED ORDER — DULOXETINE HCL 30 MG PO CPEP
30.0000 mg | ORAL_CAPSULE | Freq: Every day | ORAL | Status: DC
  Administered 2020-07-09 – 2020-07-11 (×3): 30 mg via ORAL
  Filled 2020-07-09 (×3): qty 1

## 2020-07-09 NOTE — Progress Notes (Signed)
PT Cancellation Note  Patient Details Name: Carmen Daniels MRN: [id number] DOB: 01/19/1986   Cancelled Treatment:    Reason Eval/Treat Not Completed: Other (comment)   Politely declining, stating she felt too nauseated;  Provided pt with emesis bags;   Will continue efforts to incr mobility and activity tolerance;   Roney Marion, [place] Pager [phone] Office [phone]    Colletta [place] 07/09/2020, 1:49 PM

## 2020-07-09 NOTE — Progress Notes (Signed)
Subjective:  Carmen Daniels  This morning patient reporting continued bilateral leg pain. Mentions gabapentin yesterday did not help much. Discussed that overall she is medically stable for discharge. She states she wishes to remain until she can fully walk as her parents won't be able to help much if she falls at home. Encouraged patient that a more appropriate disposition might be SNF, patient agreeable.  Objective:  Vital signs in last 24 hours: Vitals:   07/08/20 0946 07/08/20 1706 07/08/20 2123 07/09/20 0617  BP: 127/79 124/82 135/90 136/88  Pulse: 96 100  93  Resp: 18 18 16 15   Temp: 98.2 F (36.8 C) 98.2 F (36.8 C) 98.1 F (36.7 C) 98.2 F (36.8 C)  TempSrc: Oral Oral Oral Oral  SpO2: 99%  97% 94%  Weight:   89.3 kg   Height:       Physical Exam: General: Laying in bed, no acute distress CV: Regular rate, rhythm. No murmurs, rubs, gallops appreciated Pulm: Normal work of breathing on room air. Abdomen: Soft, non-tender, non-distended. Normoactive bowel sounds. MSK: Bilateral lower extremities are non-edematous but remain tender to touch.  CBC Latest Ref Rng & Units 07/07/2020 07/06/2020 07/05/2020  WBC 4.0 - 10.5 K/uL 8.3 5.8 6.7  Hemoglobin 12.0 - 15.0 g/dL 13.5 12.8 12.4  Hematocrit 36.0 - 46.0 % 40.9 38.5 36.0  Platelets 150 - 400 K/uL 172 141(L) 123(L)   CMP Latest Ref Rng & Units 07/09/2020 07/08/2020 07/07/2020  Glucose 70 - 99 mg/dL 93 95 94  BUN 6 - 20 mg/dL 25(H) 27(H) 30(H)  Creatinine 0.44 - 1.00 mg/dL 2.50(H) 3.90(H) 5.35(H)  Sodium 135 - 145 mmol/L 138 137 142  Potassium 3.5 - 5.1 mmol/L 3.9 4.2 4.0  Chloride 98 - 111 mmol/L 105 101 97(L)  CO2 22 - 32 mmol/L 26 29 37(H)  Calcium 8.9 - 10.3 mg/dL 9.1 8.5(L) 8.9  Total Protein 6.5 - 8.1 g/dL 6.1(L) 5.6(L) 5.5(L)  Total Bilirubin 0.3 - 1.2 mg/dL 1.1 1.1 0.8  Alkaline Phos 38 - 126 U/L 42 43 46  AST 15 - 41 U/L 31 36 53(H)  ALT 0 - 44 U/L 68(H) 85(H) 114(H)   Assessment/Plan: Carmen Daniels is 35yo person living  with fibromyalgia, migraines, and multiple psychiatric disorders including bipolar 1 disorder, anxiety, depression admitted 5/8 with rhabdomyolysis causing acute renal failure and liver injury, continues to have adequate urine output and improving renal function  Principal Problem:   Bipolar I disorder, most recent episode depressed ([place]) Active Problems:   Borderline personality disorder (HCC)   Adjustment disorder with mixed anxiety and depressed mood   Acute renal failure (HCC)   Malnutrition of moderate degree  #Rhabdomyolysis #Acute renal failure #Acute liver injury Overall patient's renal function continues to improve after post-ATN diuresis. sCr 2.5<3.9 with 1L UOP yesterday. LFT's continue to downtrend. Unfortunately, patient reports minimal po intake due to pain. Will continue to monitor, might have to re-start fluids if continues to not eat. This AM discussed plan for discharge, reporting that she feels she isn't ready for discharge as she cannot walk. PT/OT had previously recommended SNF but patient declined over preference. Discussed this morning that her parents could not adequately take care of her at this juncture as she is barely able to walk. She is agreeable to SNF at this time, which I think is most appropriate. Appreciate social work in helping find placement. - Continue to encourage po intake - Daily CMP, Mg, phos - Strict I/O - PT/OT  #Neuropathic BLE  pain #Fibromyalgia Pain continues to be the limiting factor in Carmen Daniels' recovery. At this point feel her pain is nerve related, possibly in conjunction with her history of fibromyalgia. States she was previously on Lyrica, for which she had horrible side effects. Mentions at home she takes tramadol, which helps control her pain. Will start back today at tramadol 50 BID given her renal function. As her renal function is improved will be able to up-titrate this. Given that her fibromyalgia could be playing a role as well, will  add duloxetine to her regimen. Hopeful triple neuropathic pain regimen will allow her to work with physical therapy more. - Tramadol 50mg  BID - Gabapentin 300mg  TID - Duloxetine 30mg  qd - PT/OT  #Moderate malnourishment Patient reporting this morning she has not been able to eat much of her mother's food given her significant amount of pain. Psychiatric evaluation previously did not think she had intentional food restriction. Will continue with multivitamins. Hopeful new pain regimen will help her eat. Can also consider mirtazapine.  - Daily multivitamin - Outpatient weight management  #Psychiatric disorders Can discuss with patient re-starting Abilify she was previously on. Otherwise will continue with home Klonipin. - F/u outpatient psychiatry - Klonipin 1mg  QID  DIET: Regular IVF: n/a DVT PPX: heparin BOWEL: Senokot-S CODE: FULL FAM COM: Was not able to call sister yesterday, will call this afternoon.  Prior to Admission Living Arrangement: Home Anticipated Discharge Location: SNF Barriers to Discharge: medical management Dispo: Anticipated discharge in approximately 1-2 day(s).   [name], MD 07/09/2020, 6:38 AM Pager: [phone] After 5pm on weekdays and 1pm on weekends: On Call pager [phone]

## 2020-07-09 NOTE — Progress Notes (Signed)
Patient's mother Arlie Solomons has requested to speak with MD and MD has been notified, RN will continue to monitor this patient.

## 2020-07-09 NOTE — Plan of Care (Signed)
  Problem: Elimination: Goal: Will not experience complications related to bowel motility Outcome: Progressing   

## 2020-07-10 DIAGNOSIS — G4700 Insomnia, unspecified: Secondary | ICD-10-CM

## 2020-07-10 DIAGNOSIS — F3130 Bipolar disorder, current episode depressed, mild or moderate severity, unspecified: Secondary | ICD-10-CM

## 2020-07-10 DIAGNOSIS — M797 Fibromyalgia: Secondary | ICD-10-CM

## 2020-07-10 DIAGNOSIS — F603 Borderline personality disorder: Secondary | ICD-10-CM

## 2020-07-10 DIAGNOSIS — N179 Acute kidney failure, unspecified: Secondary | ICD-10-CM

## 2020-07-10 DIAGNOSIS — M6282 Rhabdomyolysis: Secondary | ICD-10-CM

## 2020-07-10 LAB — MAGNESIUM: Magnesium: 1.7 mg/dL (ref 1.7–2.4)

## 2020-07-10 LAB — COMPREHENSIVE METABOLIC PANEL
ALT: 59 U/L — ABNORMAL HIGH (ref 0–44)
AST: 26 U/L (ref 15–41)
Albumin: 3.3 g/dL — ABNORMAL LOW (ref 3.5–5.0)
Alkaline Phosphatase: 41 U/L (ref 38–126)
Anion gap: 10 (ref 5–15)
BUN: 22 mg/dL — ABNORMAL HIGH (ref 6–20)
CO2: 25 mmol/L (ref 22–32)
Calcium: 9.5 mg/dL (ref 8.9–10.3)
Chloride: 105 mmol/L (ref 98–111)
Creatinine, Ser: 1.75 mg/dL — ABNORMAL HIGH (ref 0.44–1.00)
GFR, Estimated: 38 mL/min — ABNORMAL LOW (ref >60)
Glucose, Bld: 84 mg/dL (ref 70–99)
Potassium: 3.9 mmol/L (ref 3.5–5.1)
Sodium: 140 mmol/L (ref 135–145)
Total Bilirubin: 1.1 mg/dL (ref 0.3–1.2)
Total Protein: 6.3 g/dL — ABNORMAL LOW (ref 6.5–8.1)

## 2020-07-10 LAB — PHOSPHORUS: Phosphorus: 3.7 mg/dL (ref 2.5–4.6)

## 2020-07-10 MED ORDER — CYCLOBENZAPRINE HCL 5 MG PO TABS
5.0000 mg | ORAL_TABLET | Freq: Three times a day (TID) | ORAL | Status: DC
  Administered 2020-07-10 – 2020-07-11 (×3): 5 mg via ORAL
  Filled 2020-07-10 (×3): qty 1

## 2020-07-10 MED ORDER — GABAPENTIN 400 MG PO CAPS
400.0000 mg | ORAL_CAPSULE | Freq: Three times a day (TID) | ORAL | Status: DC
  Administered 2020-07-10 – 2020-07-11 (×4): 400 mg via ORAL
  Filled 2020-07-10 (×4): qty 1

## 2020-07-10 MED ORDER — TRAMADOL HCL 50 MG PO TABS
50.0000 mg | ORAL_TABLET | Freq: Four times a day (QID) | ORAL | Status: DC | PRN
  Administered 2020-07-10 – 2020-07-11 (×4): 50 mg via ORAL
  Filled 2020-07-10 (×4): qty 1

## 2020-07-10 MED ORDER — TRAZODONE HCL 100 MG PO TABS
100.0000 mg | ORAL_TABLET | Freq: Every day | ORAL | Status: DC
  Administered 2020-07-10: 100 mg via ORAL
  Filled 2020-07-10: qty 1

## 2020-07-10 NOTE — Progress Notes (Signed)
Subjective:  HD10  Patient evaluated with her parents at bedside this AM. States she has continued bilateral leg pain, asking about dilaudid. Discussed with patient and her parents that her pain is most likely nerve pain, which can be better controlled with alternative medications outside of opiate medications. Believe continued pain control with IV dilaudid would have more risk than benefit. Discussed increasing frequency of her home tramadol and increasing gabapentin. Encouraged patient to work with PT/OT and encouraged po intake, as these will ultimately lead to her recovery.  Objective:  Vital signs in last 24 hours: Vitals:   07/09/20 1113 07/09/20 1718 07/09/20 2217 07/10/20 0500  BP: 134/82 (!) 135/59 (!) 148/56 136/62  Pulse: 79 66 67 90  Resp:  17 17   Temp: 99.3 F (37.4 C) 98.1 F (36.7 C) 99.1 F (37.3 C) 98.4 F (36.9 C)  TempSrc: Oral Oral Oral Oral  SpO2: 97% 98% 98%   Weight:    90 kg  Height:       Physical Exam: General: Laying in bed in no acute distress CV: Regular rate, rhythm. No m/r/g appreciated.  Pulm: Normal work of breathing on room air. Clear to auscultation bilaterally. Abdomen: Soft, non-tender, non-distended. Normoactive bowel sounds. MSK: Normal bulk, tone. No lower extremity edema bilaterally.  CBC Latest Ref Rng & Units 07/07/2020 07/06/2020 07/05/2020  WBC 4.0 - 10.5 K/uL 8.3 5.8 6.7  Hemoglobin 12.0 - 15.0 g/dL 13.5 12.8 12.4  Hematocrit 36.0 - 46.0 % 40.9 38.5 36.0  Platelets 150 - 400 K/uL 172 141(L) 123(L)   BMP Latest Ref Rng & Units 07/10/2020 07/09/2020 07/08/2020  Glucose 70 - 99 mg/dL 84 93 95  BUN 6 - 20 mg/dL 22(H) 25(H) 27(H)  Creatinine 0.44 - 1.00 mg/dL 1.75(H) 2.50(H) 3.90(H)  Sodium 135 - 145 mmol/L 140 138 137  Potassium 3.5 - 5.1 mmol/L 3.9 3.9 4.2  Chloride 98 - 111 mmol/L 105 105 101  CO2 22 - 32 mmol/L 25 26 29   Calcium 8.9 - 10.3 mg/dL 9.5 9.1 8.5(L)   Assessment/Plan: Carmen Daniels is 35yo person living with  fibromyalgia, migraines, and multiple psychiatric disorders including bipolar disorder, anxiety, depression admitted 5/8 with rhabdomyolysis causing acute renal failure and liver injury, with good urine output and renal function close to baseline.  Principal Problem:   Bipolar I disorder, most recent episode depressed (Carmen Daniels) Active Problems:   Borderline personality disorder ([place])   Adjustment disorder with mixed anxiety and depressed mood   Acute renal failure (HCC)   Malnutrition of moderate degree  #Rhabdomyolysis #Acute renal failure #Acute liver injury Renal function continues to recover well following post-ATN diuresis over the last few days. GFR up to 35 today. Continues to have good urine output, 1L yesterday despite minimal po intake. Unfortunately, patient's insurance will not cover for SNF placement. She will also not be appropriate for CIR given she is unable to work with PT for 3 hours daily. Therefore plan for Home Health services. Can discuss personal care services as well with her Medicaid. Appreciate social work and case management. - Encourage po intake - Daily CMP, Mg, phos - Strict I/O - PT/OT  #Neuropathic bilateral leg pain #Fibromyalgia Biggest limiting factor to patient's recovery is pain. Believe there are multiple factors playing a role, including post-rhabdomyolysis, fibromyalgia, and psychogenic. Prior to today, patient has been borderline tachycardic w/ HR 90-100. Over the last 24 hours HR has improved to 60-70's. Patient reports her pain as sharp, burning-like pain, which appears more  neuropathic than mechanical. Therefore will try to deter away from escalating any new opioid medications at this time. Will increase tramadol to her home dose. Will also increase gabapentin. In addition, per physical therapy, muscle cramping also limiting her ability to walk, will continue with Flexeril. Given her clinical picture has vastly improved since arrival, believe pain is more  of a chronic issue than a complication of rhabdomyolysis. Will need to follow-up with pain management in the outpatient setting. - Tramadol 50mg  q6h - Gabapentin 400mg  TID - Duloxetine 30mg  qd - Flexeril 5mg  TID - PT/OT - Outpatient pain management  #Moderate malnourishment Patient continues to report she is unable to eat much due to pain. Believe there is a psychogenic component to this. Will need to follow-up with outpatient weight management counseling. - Daily multivitamin - Outpatient weight management  #Psychiatric disorders Patient would likely benefit in re-starting antipsychotic, possibly Abilify (previously stopped taking about a month ago.) Otherwise will continue home Klonipin. - F/u outpatient psychiatry - Klonipin 1mg  QID  #Insomnia Patient reports minimal sleep, only 11mins last night. Hopeful increase in pain regimen will aid in sleep. Will also start trazodone. - Start trazodone 100mg  qd  DIET: Regular IVF: n/a DVT PPX: heparin BOWEL: n/a CODE: FULL FAM COM: Discussed with parents at bedside today as well as sister Carmen Daniels this morning  Prior to Admission Living Arrangement: Home Anticipated Discharge Location: Home Barriers to Discharge: medical management Dispo: Anticipated discharge in approximately 1 day(s).   [name], MD 07/10/2020, 6:35 AM Pager: [phone] After 5pm on weekdays and 1pm on weekends: On Call pager [phone]

## 2020-07-10 NOTE — Progress Notes (Signed)
Occupational Therapy Treatment Patient Details Name: Carmen Daniels MRN: [id number] DOB: 12-14-1985 Today's Date: 07/10/2020    History of present illness 35 y.o. female admitted 06/29/20 with c/o weakness and syncope; pt reports dizziness and collapse on 5/6, was down for ~8 hrs then able to crawl to bedroom and has been in bed for 2 days prior to ED admit. Workup for ARF, rhabdomyolysis. PMH includes fibromyalgia, bipolar disorder, OCD, schizophrenia, asthma.   OT comments  Pt agreeable to bed level OT session, pt declined further mobility secondary to having recently worked with PT, bilateral calf pain and cramping, pt requesting to sleep "through the pain".  Pt completed bed mobility independently. Session focused on education on compensatory strategies for LB dressing and transfers. Pt with current plan to discharge home to parent's house with [facility] and support from her parents. Pt will continue to benefit from skilled OT services to maximize safety and independence with ADL/IADL and functional mobility. Will continue to follow acutely and progress as tolerated.    Follow Up Recommendations  Home health OT (pt with current plan to d/c home with her parents)    Equipment Recommendations  3 in 1 bedside commode    Recommendations for Other Services PT consult    Precautions / Restrictions Precautions Precautions: Fall;Other (comment) Precaution Comments: bilateral foot drop (pt reports bilateral foot in-toeing is baseline) Required Braces or Orthoses: Other Brace Other Brace: PRAFO bilateral Restrictions Weight Bearing Restrictions: No       Mobility Bed Mobility Overal bed mobility: Independent                  Transfers                 General transfer comment: pt deferred further mobiltiy    Balance Overall balance assessment: Needs assistance Sitting-balance support: No upper extremity supported;Feet supported Sitting balance-Leahy Scale: Good Sitting  balance - Comments: pt sat EOB without loss of balance                                   ADL either performed or assessed with clinical judgement   ADL Overall ADL's : Needs assistance/impaired Eating/Feeding: Set up;Sitting   Grooming: Set up;Sitting               Lower Body Dressing: Set up;Sitting/lateral leans Lower Body Dressing Details (indicate cue type and reason): discussed compensatory strategies for LB dressing due to mobility limitations secondary to pain   Toilet Transfer Details (indicate cue type and reason): pt declined           General ADL Comments: pt agreeable to sit EOB, declined further mobiltiy secondary to pain in bilateral calfs and having recently worked with PT.     Vision       Perception     Praxis      Cognition Arousal/Alertness: Awake/alert Behavior During Therapy: Flat affect Overall Cognitive Status: No family/caregiver present to determine baseline cognitive functioning Area of Impairment: Following commands;Problem solving;Awareness;Safety/judgement;Attention                   Current Attention Level: Sustained   Following Commands: Follows one step commands with increased time Safety/Judgement: Decreased awareness of safety;Decreased awareness of deficits Awareness: Intellectual Problem Solving: Slow processing;Requires verbal cues General Comments: pt with self limiting behaviors, agreeable to EOB but declined further mobiltiy secondary to having just worked with PT and pt  requesting to rest.        Exercises Exercises: Other exercises Other Exercises Other Exercises: reinforced calf stretches frequently   Shoulder Instructions       General Comments vss on RA    Pertinent Vitals/ Pain       Pain Assessment: Faces Faces Pain Scale: Hurts whole lot Pain Location: bilateral calves Pain Descriptors / Indicators: Grimacing;Guarding Pain Intervention(s): Limited activity within patient's  tolerance;Monitored during session  Home Living                                          Prior Functioning/Environment              Frequency  Min 2X/week        Progress Toward Goals  OT Goals(current goals can now be found in the care plan section)  Progress towards OT goals: Progressing toward goals  Acute Rehab OT Goals Patient Stated Goal: for pain to decrease OT Goal Formulation: With patient Time For Goal Achievement: 07/14/20 Potential to Achieve Goals: Good ADL Goals Pt Will Perform Grooming: with min assist;standing Pt Will Perform Lower Body Dressing: with min guard assist;sit to/from stand Pt Will Transfer to Toilet: with min guard assist;ambulating;bedside commode Pt Will Perform Toileting - Clothing Manipulation and hygiene: with supervision;sit to/from stand;sitting/lateral leans  Plan Discharge plan remains appropriate    Co-evaluation                 AM-PAC OT "6 Clicks" Daily Activity     Outcome Measure   Help from another person eating meals?: None Help from another person taking care of personal grooming?: A Little Help from another person toileting, which includes using toliet, bedpan, or urinal?: A Lot Help from another person bathing (including washing, rinsing, drying)?: A Lot Help from another person to put on and taking off regular upper body clothing?: A Little Help from another person to put on and taking off regular lower body clothing?: A Little 6 Click Score: 17    End of Session Equipment Utilized During Treatment: Gait belt;Rolling walker;Other (comment) (PRAFOs)  OT Visit Diagnosis: Unsteadiness on feet (R26.81);Other abnormalities of gait and mobility (R26.89);Muscle weakness (generalized) (M62.81)   Activity Tolerance Patient tolerated treatment well   Patient Left in bed;with call bell/phone within reach   Nurse Communication Mobility status        Time: 1040-1050 OT Time Calculation (min):  10 min  Charges: OT General Charges $OT Visit: 1 Visit OT Treatments $Self Care/Home Management : 8-22 mins  Helene Kelp OTR/L Acute Rehabilitation Services Office: [place] 07/10/2020, 11:00 AM

## 2020-07-10 NOTE — Progress Notes (Addendum)
Physical Therapy Treatment Patient Details Name: Carmen Daniels MRN: [id number] DOB: October 20, 1985 Today's Date: 07/10/2020    History of Present Illness 35 y.o. female admitted 06/29/20 with c/o weakness and syncope; pt reports dizziness and collapse on 5/6, was down for ~8 hrs then able to crawl to bedroom and has been in bed for 2 days prior to ED admit. Workup for ARF, rhabdomyolysis. PMH includes fibromyalgia, bipolar disorder, OCD, schizophrenia, asthma.    PT Comments    Pt limited to a few steps due to cramping in bilateral calves. Doesn't require much assist just can't go far due to pain. Per case management will not get therapy at SNF due to medicaid status. Recommend home with HHPT  And below recommended equipment. Expect mobility will improve as calf cramping improves.    Follow Up Recommendations  Home health PT;Supervision for mobility/OOB     Equipment Recommendations  Rolling walker with 5" wheels;Wheelchair (measurements PT)    Recommendations for Other Services       Precautions / Restrictions Precautions Precautions: Fall;Other (comment) Precaution Comments: bilateral foot drop (pt reports bilateral foot in-toeing is baseline) Required Braces or Orthoses: Other Brace Other Brace: PRAFO bilateral Restrictions Weight Bearing Restrictions: No    Mobility  Bed Mobility Overal bed mobility: Independent                  Transfers Overall transfer level: Needs assistance Equipment used: Rolling walker (2 wheeled);4-wheeled walker Transfers: Sit to/from [employer] to Stand: Min guard Stand pivot transfers: Min guard       General transfer comment: Verbal cues for hand placement  Ambulation/Gait Ambulation/Gait assistance: Min guard Gait Distance (Feet): 3 Feet Assistive device: Rolling walker (2 wheeled) Gait Pattern/deviations: Step-through pattern;Decreased stride length Gait velocity: decr Gait velocity interpretation: <1.31  ft/sec, indicative of household ambulator General Gait Details: Pt limited by calf cramping bilaterally   Stairs             Wheelchair Mobility    Modified Rankin (Stroke Patients Only)       Balance Overall balance assessment: Needs assistance Sitting-balance support: No upper extremity supported;Feet supported Sitting balance-Leahy Scale: Good Sitting balance - Comments: pt sat EOB without loss of balance   Standing balance support: Bilateral upper extremity supported Standing balance-Leahy Scale: Poor Standing balance comment: reliant on UE support due to painful calf cramping                            Cognition Arousal/Alertness: Awake/alert Behavior During Therapy: Flat affect Overall Cognitive Status: Within Functional Limits for tasks assessed                                Exercises Other Exercises Other Exercises: reinforced calf stretches frequently    General Comments       Pertinent Vitals/Pain Pain Assessment: Faces Faces Pain Scale: Hurts whole lot Pain Location: bilateral calves Pain Descriptors / Indicators: Grimacing;Guarding Pain Intervention(s): Limited activity within patient's tolerance;Premedicated before session;Repositioned;Monitored during session    Home Living                      Prior Function            PT Goals (current goals can now be found in the care plan section) Acute Rehab PT Goals Patient Stated Goal: for pain to decrease Progress towards  PT goals: Not progressing toward goals - comment (pain)    Frequency    Min 3X/week      PT Plan Discharge plan needs to be updated    Co-evaluation              AM-PAC PT "6 Clicks" Mobility   Outcome Measure  Help needed turning from your back to your side while in a flat bed without using bedrails?: None Help needed moving from lying on your back to sitting on the side of a flat bed without using bedrails?: None Help  needed moving to and from a bed to a chair (including a wheelchair)?: A Little Help needed standing up from a chair using your arms (e.g., wheelchair or bedside chair)?: A Little Help needed to walk in hospital room?: A Little Help needed climbing 3-5 steps with a railing? : Total 6 Click Score: 18    End of Session Equipment Utilized During Treatment: Gait belt Activity Tolerance: Patient limited by pain Patient left: in bed;with call bell/phone within reach   PT Visit Diagnosis: Other abnormalities of gait and mobility (R26.89);Pain Pain - Right/Left:  (bilateral) Pain - part of body: Leg     Time: 7124-5809 PT Time Calculation (min) (ACUTE ONLY): 20 min  Charges:  $Gait Training: 8-22 mins                     [place] Pager [phone] Office [place] 07/10/2020, 11:07 AM

## 2020-07-11 ENCOUNTER — Encounter (HOSPITAL_COMMUNITY): Payer: Medicaid Other | Admitting: Psychiatry

## 2020-07-11 LAB — COMPREHENSIVE METABOLIC PANEL
ALT: 47 U/L — ABNORMAL HIGH (ref 0–44)
AST: 23 U/L (ref 15–41)
Albumin: 3.5 g/dL (ref 3.5–5.0)
Alkaline Phosphatase: 43 U/L (ref 38–126)
Anion gap: 8 (ref 5–15)
BUN: 20 mg/dL (ref 6–20)
CO2: 24 mmol/L (ref 22–32)
Calcium: 9.4 mg/dL (ref 8.9–10.3)
Chloride: 105 mmol/L (ref 98–111)
Creatinine, Ser: 1.4 mg/dL — ABNORMAL HIGH (ref 0.44–1.00)
GFR, Estimated: 50 mL/min — ABNORMAL LOW (ref >60)
Glucose, Bld: 96 mg/dL (ref 70–99)
Potassium: 3.6 mmol/L (ref 3.5–5.1)
Sodium: 137 mmol/L (ref 135–145)
Total Bilirubin: 1.2 mg/dL (ref 0.3–1.2)
Total Protein: 6.5 g/dL (ref 6.5–8.1)

## 2020-07-11 LAB — PHOSPHORUS: Phosphorus: 4 mg/dL (ref 2.5–4.6)

## 2020-07-11 LAB — MAGNESIUM: Magnesium: 1.8 mg/dL (ref 1.7–2.4)

## 2020-07-11 MED ORDER — DULOXETINE HCL 30 MG PO CPEP: 30.0000 mg | ORAL_CAPSULE | Freq: Every day | ORAL | 0 refills | Status: DC

## 2020-07-11 MED ORDER — CYCLOBENZAPRINE HCL 5 MG PO TABS: 5.0000 mg | ORAL_TABLET | Freq: Three times a day (TID) | ORAL | 0 refills | Status: AC

## 2020-07-11 MED ORDER — TRAZODONE HCL 100 MG PO TABS: 100.0000 mg | ORAL_TABLET | Freq: Every day | ORAL | 0 refills | Status: DC

## 2020-07-11 NOTE — Progress Notes (Signed)
Physical Therapy Treatment Patient Details Name: Carmen Daniels MRN: [id number] DOB: Jul 17, 1985 Today's Date: 07/11/2020    History of Present Illness 35 y.o. female admitted 06/29/20 with c/o weakness and syncope; pt reports dizziness and collapse on 5/6, was down for ~8 hrs then able to crawl to bedroom and has been in bed for 2 days prior to ED admit. Workup for ARF, rhabdomyolysis. PMH includes fibromyalgia, bipolar disorder, OCD, schizophrenia, asthma.    PT Comments    Pt lying prone in bed on entry.  Requires increased stimulation for waking up. Pt reports she does not want to work with therapy today because she is going home today and she has not had any pain medications. Educated pt that she had just had her pain medications to which she said "It was just Tramadol, that doesn't do anything." Pt reluctantly agreeable to getting up to EoB. At which point mother enters room. Pt stands with min guard but only tolerates approx 30 sec before getting back in bed. Educated pt and mother on need for moving every hour even if it is only to do some stretching. Encouraged pt to get up and walk to bathroom at home to pair movement with functional tasks. Discharge plan remains appropriate.    Follow Up Recommendations  Home health PT;Supervision for mobility/OOB     Equipment Recommendations  Rolling walker with 5" wheels;Wheelchair (measurements PT)       Precautions / Restrictions Precautions Precautions: Fall;Other (comment) Precaution Comments: bilateral foot drop (pt reports bilateral foot in-toeing is baseline) Required Braces or Orthoses: Other Brace Other Brace: PRAFO bilateral Restrictions Weight Bearing Restrictions: No    Mobility  Bed Mobility Overal bed mobility: Independent                  Transfers Overall transfer level: Needs assistance Equipment used: Rolling walker (2 wheeled);4-wheeled walker Transfers: Sit to/from [employer] to Stand:  Min guard         General transfer comment: min guard for coming to standing, able to tolerate for 20 sec before having to sit due to pain  Ambulation/Gait             General Gait Details: refused to progress ambulation as she reports going home today and saving her energy for that         Balance Overall balance assessment: Needs assistance Sitting-balance support: No upper extremity supported;Feet supported Sitting balance-Leahy Scale: Good     Standing balance support: Bilateral upper extremity supported Standing balance-Leahy Scale: Poor Standing balance comment: reliant on UE support due to painful calf cramping                            Cognition Arousal/Alertness: Awake/alert Behavior During Therapy: Flat affect Overall Cognitive Status: Within Functional Limits for tasks assessed                                        Exercises Other Exercises Other Exercises: calf, quad and hamstring stretches in bed Other Exercises: bridging x5    General Comments General comments (skin integrity, edema, etc.): VSS on RA      Pertinent Vitals/Pain Pain Assessment: Faces Faces Pain Scale: Hurts whole lot Pain Location: bilateral calves Pain Descriptors / Indicators: Grimacing;Guarding Pain Intervention(s): Limited activity within patient's tolerance;Monitored during session;Premedicated before session;Repositioned  PT Goals (current goals can now be found in the care plan section) Acute Rehab PT Goals Patient Stated Goal: for pain to decrease PT Goal Formulation: With patient Time For Goal Achievement: 07/15/20 Potential to Achieve Goals: Fair Progress towards PT goals: Not progressing toward goals - comment (self limited due to going home today)    Frequency    Min 3X/week      PT Plan Discharge plan needs to be updated       AM-PAC PT "6 Clicks" Mobility   Outcome Measure  Help needed turning from your  back to your side while in a flat bed without using bedrails?: None Help needed moving from lying on your back to sitting on the side of a flat bed without using bedrails?: None Help needed moving to and from a bed to a chair (including a wheelchair)?: A Little Help needed standing up from a chair using your arms (e.g., wheelchair or bedside chair)?: A Little Help needed to walk in hospital room?: A Little Help needed climbing 3-5 steps with a railing? : Total 6 Click Score: 18    End of Session Equipment Utilized During Treatment: Gait belt Activity Tolerance: Patient limited by pain Patient left: in bed;with call bell/phone within reach Nurse Communication: Mobility status PT Visit Diagnosis: Other abnormalities of gait and mobility (R26.89);Pain Pain - Right/Left:  (bilateral) Pain - part of body: Leg     Time: 1213-1242 PT Time Calculation (min) (ACUTE ONLY): 29 min  Charges:  $Therapeutic Exercise: 8-22 mins $Self Care/Home Management: 8-22                     Niylah Hassan B. Migdalia Dk PT, DPT Acute Rehabilitation Services Pager [phone] Office [phone]    [place] 07/11/2020, 2:01 PM

## 2020-07-11 NOTE — Progress Notes (Signed)
Subjective:  HD11  Patient evaluated at bedside this AM. States her pain is better controlled on her current regimen. Has not been eating much, but was able to work with physical therapy yesterday. Discussed plan for discharge home today. Encouraged patient to continue working with physical therapy.  Objective:  Vital signs in last 24 hours: Vitals:   07/10/20 0856 07/10/20 1613 07/10/20 2103 07/10/20 2328  BP: 139/90 138/73 (!) 145/89 140/76  Pulse: 71 (!) 59 81 64  Resp: 19 17 18 18   Temp: 98.7 F (37.1 C) 98.2 F (36.8 C) 98.6 F (37 C) 98.3 F (36.8 C)  TempSrc: Oral Oral Oral   SpO2: 99% 98% 100% 98%  Weight:      Height:       Physical Exam: General: Laying in bed comfortably, no acute distress CV: Regular rate, rhythm. No murmurs, rubs, gallops. Pulm: Normal work of breathing on room air. Clear to auscultation bilaterally. Abdomen: Soft, non-tender, non-distended. MSK: Bilateral lower extremities without pitting edema. Normal bulk, tone throughout. Neuro: Awake, answering questions appropriately.   CBC Latest Ref Rng & Units 07/07/2020 07/06/2020 07/05/2020  WBC 4.0 - 10.5 K/uL 8.3 5.8 6.7  Hemoglobin 12.0 - 15.0 g/dL 13.5 12.8 12.4  Hematocrit 36.0 - 46.0 % 40.9 38.5 36.0  Platelets 150 - 400 K/uL 172 141(L) 123(L)   CMP Latest Ref Rng & Units 07/11/2020 07/10/2020 07/09/2020  Glucose 70 - 99 mg/dL 96 84 93  BUN 6 - 20 mg/dL 20 22(H) 25(H)  Creatinine 0.44 - 1.00 mg/dL 1.40(H) 1.75(H) 2.50(H)  Sodium 135 - 145 mmol/L 137 140 138  Potassium 3.5 - 5.1 mmol/L 3.6 3.9 3.9  Chloride 98 - 111 mmol/L 105 105 105  CO2 22 - 32 mmol/L 24 25 26   Calcium 8.9 - 10.3 mg/dL 9.4 9.5 9.1  Total Protein 6.5 - 8.1 g/dL 6.5 6.3(L) 6.1(L)  Total Bilirubin 0.3 - 1.2 mg/dL 1.2 1.1 1.1  Alkaline Phos 38 - 126 U/L 43 41 42  AST 15 - 41 U/L 23 26 31   ALT 0 - 44 U/L 47(H) 59(H) 68(H)   Assessment/Plan: Carmen Daniels is 35yo person living with fibromyalgia, migraines, and multiple  psychiatric disorders including bipolar disorder, anxiety, depression admitted 5/8 with rhabdomyolysis causing acute renal failure and livery injury, now resolved with improved pain.  Principal Problem:   Bipolar I disorder, most recent episode depressed ([place]) Active Problems:   Borderline personality disorder ([place])   Adjustment disorder with mixed anxiety and depressed mood   Acute renal failure (HCC)   Malnutrition of moderate degree  #Rhabdomyolysis #Acute renal failure #Acute liver injury Renal function and LFT's continue to improve, now similar to patient's baseline. Continuing to encourage patient to have po intake. Otherwise patient is medically cleared for discharge, as she continues to be hemodynamically stable with much improved renal and liver function. Patient to follow-up with PCP within the next week. - Encourage po intake - Daily CMP, Mg, phos - Strict I/O - PT/OT  #Neuropathic bilateral leg pain #Fibromyalgia Patient was able to work more with physical therapy yesterday with adjustments to her pain regimen. This morning reports pain is more controlled. Continue to believe her pain is neuropathic, as she has seen improvement on this regimen. Per physical therapy, biggest limiting factor is pain. Now that this is better controlled with make she patient has Home Health set up with walker. Believe a wheelchair could serve as a crutch for the patient and limit her ability to improve.  Will also refer to pain clinic to help continued adjustments to her regimen.  - Tramadol 50mg  q6h - Gabapentin 300mg  TID - Duloxetine 30mg  qd - Flexeril 5mg  TID - Home Health PT/OT - Referral to pain clinic  #Moderate malnourishment Patient will continue with multivitamins upon discharge. Outpatient referral placed for dietician as well. - Daily multivitamin - Referral to outpatient nutrition  #Psychiatric disorders Patient would likely benefit from close follow-up with psychiatry, as likely  this is contributing to her recent symptoms and current complaints.  - Home clonazepam 1mg  QID - Referral to outpatient psychiatry  #Insomnia Patient did well last night with the trazodone, per patient and nursing. Will discharge patient with this dose. - Trazodone 100mg  QHS  DIET: Regular IVF: n/a DVT PPX: heparin BOWEL: n/a CODE: FULL FAM COM: Discussed with patient's parents and sister yesterday.  Prior to Admission Living Arrangement: Home Anticipated Discharge Location: Home w/ [facility] PT/OT Barriers to Discharge: n/a Dispo: Anticipated discharge in approximately 0 day(s).   [name], MD 07/11/2020, 7:02 AM Pager: [phone] After 5pm on weekdays and 1pm on weekends: On Call pager [phone]

## 2020-07-11 NOTE — Progress Notes (Signed)
DISCHARGE NOTE HOME Carmen Daniels to be discharged home per MD order. Discussed prescriptions and follow up appointments with the patient. Prescriptions given to patient; medication list explained in detail. Patient verbalized understanding.  Skin clean, dry and intact without evidence of skin break down, no evidence of skin tears noted. IV catheter discontinued intact. Site without signs and symptoms of complications. Dressing and pressure applied. Pt denies pain at the site currently. No complaints noted.  Patient free of lines, drains, and wounds.   An After Visit Summary (AVS) was printed and given to the patient. Patient escorted via wheelchair, and discharged home via private auto.  Jos Cygan S Elmar [place], RN

## 2020-07-11 NOTE — Progress Notes (Incomplete)
Made aware by CCMD patient experienced 6 PVCs. Patient assessed to be asymptomatic without additional complaints. IMTS on-call notified.

## 2020-07-11 NOTE — Discharge Summary (Signed)
Name: Carmen Daniels MRN: [id number] DOB: 1985/08/10 35 y.o. PCP: [name], MD  Date of Admission: 06/29/2020  4:57 PM Date of Discharge: 07/11/2020 Attending Physician: Campbell Riches, MD  Discharge Diagnosis: 1. Acute renal failure and liver injury 2/2 rhabdomyolysis 2. Neuropathic leg pain 3. Moderate malnourishment 4. Bilateral foot drop 5. Psychiatric disorders  Discharge Medications: Allergies as of 07/11/2020      Reactions   Keflex [cephalexin] Anaphylaxis      Medication List    STOP taking these medications   ibuprofen 200 MG tablet Commonly known as: ADVIL   zolpidem 10 MG tablet Commonly known as: AMBIEN     TAKE these medications   Ajovy 225 MG/1.5ML Soaj Generic drug: Fremanezumab-vfrm Inject 225 mg into the skin every 30 (thirty) days.   ARIPiprazole 5 MG tablet Commonly known as: ABILIFY TAKE 1 TABLET (5 MG TOTAL) BY MOUTH DAILY.   Austedo 9 MG Tabs Generic drug: Deutetrabenazine TAKE 2 TABLETS BY MOUTH TWICE A DAY   beclomethasone 40 MCG/ACT inhaler Commonly known as: QVAR Inhale 1 puff into the lungs 2 (two) times daily as needed (asthma).   Botox 100 units Solr injection Generic drug: botulinum toxin Type A Inject 200 Units into the muscle every 3 (three) months. Inject into head and neck muscles   clonazePAM 1 MG tablet Commonly known as: KLONOPIN Take 1 mg by mouth 4 (four) times daily.   cyclobenzaprine 5 MG tablet Commonly known as: FLEXERIL Take 1 tablet (5 mg total) by mouth 3 (three) times daily.   DULoxetine 30 MG capsule Commonly known as: CYMBALTA Take 1 capsule (30 mg total) by mouth daily.   gabapentin 300 MG capsule Commonly known as: NEURONTIN Take 1 capsule by mouth 3 (three) times daily as needed for pain.   ONE-A-DAY WOMENS FORMULA PO Take 1 tablet by mouth daily.   SUMAtriptan 100 MG tablet Commonly known as: IMITREX Take 1 tablet (100 mg total) by mouth every 2 (two) hours as needed for migraine. MAY DOSE  2 PER DAY OR 3   traMADol 50 MG tablet Commonly known as: ULTRAM Take 50 mg by mouth 4 (four) times daily as needed for pain.   traZODone 100 MG tablet Commonly known as: DESYREL Take 1 tablet (100 mg total) by mouth at bedtime.      Disposition and follow-up:   [name] was discharged from [facility] in Stable condition.  At the hospital follow up visit please address:  1. Acute renal failure and liver injury 2/2 rhabdomyolysis: Please ensure LFT's and renal function continue to normalize.  2. Neuropathic leg pain: Patient discharged on tramadol, duloxetine, flexeril, and gabapentin. Referral for pain clinic also placed.  3. Moderate malnourishment: Patient to follow-up with outpatient weight management.  4. Bilateral foot drop: Patient to continue work with PT/OT. Believe 2/2 compression nerve palsy.  5. Psychiatric disorders: Please make sure patient is following up with her psychiatrist, as she requires stabilizing medications.   6.  Labs / imaging needed at time of follow-up: CBC, BMP  7.  Pending labs/ test needing follow-up: n/a  Follow-up Appointments:    Follow-up Information    [name], MD. Schedule an appointment as soon as possible for a visit in 1 week(s).   Specialty: Internal Medicine Contact information: [place] [address] [place] by problem list: 1. Acute renal  failure and liver injury 2/2 rhabdomyolysis: Patient arrived 5/8 after having syncopal episode in setting of marked dehydration after 1 week of decreased po intake. Patient was found down approximately 8 hours after syncopal episode and laid in the bed for roughly 2 more days prior to arrival in ED. Work-up in ED revealed likely rhabdomyolysis with acute renal failure and liver injury. She was subsequently started on IV hydration and admitted to IMTS. After over 24 hours of aggressive IV fluid resuscitation, patient's  renal function worsened with minimal urine output while LFT's continued to improve. Nephrology was subsequently consulted. Fluids were switched to bicarbonate based in order to decrease risk of heme-related injury and lasix was added to help promote diuresis. ANA and ANCA were obtained to rule out other causes such as vasculitis, both negative. Unfortunately, renal function continued to worsen until 5/13 w/ serum creatinine of 8.3 and GFR 6. Over the next few days patient had significant amount of diuresis, net negative 17L. Renal function subsequently improved. This is most consistent with acute tubular necrosis followed by auto diuresis. Patient remained euvolemic until discharge. At that time LFT's had normalized and renal function was close to normal, sCr 1.4 with GFR 50. Expect this to continue to normalize as she goes home.  2. Neuropathic leg pain: Given patient's rhabdomyolysis patient had significant pain throughout her bilateral legs. Throughout much of patient's admission her legs were extremely tender to touch, requiring IV dilaudid to help control her pain. As her renal function improved, she continued to endorse pain. Further investigation revealed this pain appeared to be neuropathic. Unfortunately this pain severely limited her ability to participate with physical therapy, and she reported this hindered her appetite. Given she has a history of fibromyalgia, IV dilaudid was discontinued in lieu of more appropriate neuropathic medications. She was originally started on home renally dosed tramadol and gabapentin. We up-titrated these medications and added duloxetine and flexeril for muscle cramps. At the day of discharge, patient reported improvement of pain and was discharged with similar medication regimen. Referral was placed upon discharge for patient to attend pain clinic.  3. Moderate malnourishment: Prior to arrival, patient had only been drinking half a bottle of water for the last week in  order to lose weight after stopping Abilify. Psychiatry was consulted while patient was hospitalized, but did not think patient was actively avoiding food for weight loss management. RD was also consulted, recommended referral to outpatient nutrition for further weight management.  4. Bilateral foot drop: Bilateral foot drop on exam remained throughout patient's hospitalization. Most likely this was due to compression nerve palsy from being immobilized for a few days. Patient to continue working with physical therapy.   5. Psychiatric disorders: Per chart review patient has multiple psychiatric disorders, including bipolar 1 disorder, borderline personality disorder, obsessive compulsive disorder, schizoaffective disorder, and anxiety. Patient apparently has tried multiple medication regimens in the past but did not feel like these worked for her. Per patient's family, she is to follow-up with her psychiatrist and try ECT.  Discharge Exam:   BP 140/76 (BP Location: Left Arm)   Pulse 64   Temp 98.3 F (36.8 C)   Resp 18   Ht 5\' 11"  (1.803 m)   Wt 90 kg   LMP  (LMP Unknown)   SpO2 98%   BMI 27.67 kg/m  General: Laying in bed comfortably, no acute distress CV: Regular rate, rhythm. No murmurs, rubs, gallops. Warm extremities. Pulm: Normal work of breathing on room air. Clear  to auscultation bilaterally. GI: Soft, non-tender, non-distended. Normoactive bowel sounds. MSK: Bilateral lower extremities without pitting edema. Neuro: Awake, answering questions appropriately.  Pertinent Labs, Studies, and Procedures:  CBC Latest Ref Rng & Units 07/07/2020 07/06/2020 07/05/2020  WBC 4.0 - 10.5 K/uL 8.3 5.8 6.7  Hemoglobin 12.0 - 15.0 g/dL 13.5 12.8 12.4  Hematocrit 36.0 - 46.0 % 40.9 38.5 36.0  Platelets 150 - 400 K/uL 172 141(L) 123(L)   BMP Latest Ref Rng & Units 07/11/2020 07/10/2020 07/09/2020  Glucose 70 - 99 mg/dL 96 84 93  BUN 6 - 20 mg/dL 20 22(H) 25(H)  Creatinine 0.44 - 1.00 mg/dL 1.40(H)  1.75(H) 2.50(H)  Sodium 135 - 145 mmol/L 137 140 138  Potassium 3.5 - 5.1 mmol/L 3.6 3.9 3.9  Chloride 98 - 111 mmol/L 105 105 105  CO2 22 - 32 mmol/L 24 25 26   Calcium 8.9 - 10.3 mg/dL 9.4 9.5 9.1   Discharge Instructions: Discharge Instructions    Amb Referral to Nutrition and Diabetic Education   Complete by: As directed      Ms. Burdine,  I am glad you are feeling better and can be discharged from the hospital! You were admitted because you had muscle breakdown causing acute kidney failure and liver injury, called rhabdomyolysis. Thankfully your kidney and liver functions have vastly improved. We think your weakness will improve as you continue to work with physical therapy at home. Please see the following notes:   -Your pain regimen is as follows:  Tramadol 50mg  every 6 hours as needed  Flexeril 5mg  three times daily  Cymbalta 30mg  daily  Gabapentin 300mg  three times daily   In addition, we have given you trazodone to help with sleep. You will take 100mg  nightly.   We would like for you to follow up with dieticians as well as psychiatry to stabilize your medications. We have placed the referral and they will call you to set up appointments. In addition, please make sure to follow-up with your primary care doctor within the next week.   It was a pleasure to meet you, Ms. Swearingin. I wish you the best and hope you stay happy and healthy!   Thank you,  [name], MD  Signed: [name], MD 07/11/2020, 7:01 AM   Pager: [phone]

## 2020-07-11 NOTE — TOC Transition Note (Addendum)
Transition of Care [facility]) - CM/SW Discharge Note   Patient Details  Name: Carmen Daniels MRN: [id number] Date of Birth: March 23, 1985  Transition of Care [facility]) CM/SW Contact:  Verdell Carmine, RN Phone Number: 07/11/2020, 2:42 PM   Clinical Narrative:    Patient  On day 10 of hospitalization needs Home Health. [facility] and 3:1 ordered. No other DME Ordered at this time after discussion with MD and PT> Difficulty finding HH will continue to search for [facility] to meet patients needs. Patient will discharge home today [address] and Alvis Lemmings declined interim declined no availability in patients location. Spoke to [employer] nurse made aware that it may be difficult to secure [facility]  Final next level of care: [place] Barriers to Discharge: Inadequate or no insurance (difficult to find [place] due to insurance)   Patient Goals and CMS Choice        Discharge Placement             Home with [facility]          Discharge Plan and Services                DME Arranged: 3-N-1   Date DME Agency Contacted: 07/11/20 Time DME Agency Contacted: 0900 Representative spoke with at DME Agency: [place]            Social Determinants of Health ([place]) Interventions     Readmission Risk Interventions No flowsheet data found.

## 2020-07-14 ENCOUNTER — Telehealth: Payer: Self-pay

## 2020-07-14 NOTE — Telephone Encounter (Signed)
Notification about the office taking over [place] orders, patient did not answer phone and the mailbox is full

## 2020-07-14 NOTE — Telephone Encounter (Signed)
Called Dr. Santina Evans office at [facility], as unable to obtain [place] for patient as ordered from hospitalization.  Spoke to office, they will have Dr Nancy Fetter sign orders and will  and continue the search for [facility] . Patient notified.

## 2020-07-15 NOTE — Care Management (Signed)
Case management called and spoke to the patient's mother, Arlie Solomons [phone] and the patient is continuing to have increased pain and difficulty eating at home.  I updated the patient's mother that Orvan Falconer, [place] spoke with [facility] and asked that the primary care office continue to search for available home health agencies that may provide services in the home, since the hospital was unable to find an accepting home health agency.  I gave the patient's mother the number to [facility] on [address] and she was planning on speaking with the office nurse to describe her current medical problems and will most likely follow up for an appointment if needed.

## 2020-07-23 ENCOUNTER — Emergency Department: Payer: Medicaid Other

## 2020-07-23 ENCOUNTER — Other Ambulatory Visit: Payer: Self-pay

## 2020-07-23 ENCOUNTER — Emergency Department
Admission: EM | Admit: 2020-07-23 | Discharge: 2020-07-24 | Disposition: A | Discharge status: Home / Self Care | Payer: Medicaid Other | Attending: Emergency Medicine | Admitting: Emergency Medicine

## 2020-07-23 DIAGNOSIS — M79606 Pain in leg, unspecified: Secondary | ICD-10-CM | POA: Insufficient documentation

## 2020-07-23 DIAGNOSIS — F4010 Social phobia, unspecified: Secondary | ICD-10-CM | POA: Diagnosis present

## 2020-07-23 DIAGNOSIS — F319 Bipolar disorder, unspecified: Secondary | ICD-10-CM | POA: Diagnosis present

## 2020-07-23 DIAGNOSIS — E860 Dehydration: Secondary | ICD-10-CM | POA: Insufficient documentation

## 2020-07-23 DIAGNOSIS — J4530 Mild persistent asthma, uncomplicated: Secondary | ICD-10-CM | POA: Diagnosis not present

## 2020-07-23 DIAGNOSIS — F17210 Nicotine dependence, cigarettes, uncomplicated: Secondary | ICD-10-CM | POA: Diagnosis not present

## 2020-07-23 DIAGNOSIS — F603 Borderline personality disorder: Secondary | ICD-10-CM | POA: Diagnosis present

## 2020-07-23 DIAGNOSIS — Z20822 Contact with and (suspected) exposure to covid-19: Secondary | ICD-10-CM | POA: Diagnosis not present

## 2020-07-23 DIAGNOSIS — F3189 Other bipolar disorder: Secondary | ICD-10-CM | POA: Diagnosis present

## 2020-07-23 DIAGNOSIS — R531 Weakness: Secondary | ICD-10-CM | POA: Diagnosis present

## 2020-07-23 DIAGNOSIS — G894 Chronic pain syndrome: Secondary | ICD-10-CM | POA: Diagnosis not present

## 2020-07-23 DIAGNOSIS — G2401 Drug induced subacute dyskinesia: Secondary | ICD-10-CM | POA: Diagnosis present

## 2020-07-23 DIAGNOSIS — R1013 Epigastric pain: Secondary | ICD-10-CM | POA: Diagnosis not present

## 2020-07-23 DIAGNOSIS — R4182 Altered mental status, unspecified: Secondary | ICD-10-CM | POA: Diagnosis present

## 2020-07-23 DIAGNOSIS — N179 Acute kidney failure, unspecified: Secondary | ICD-10-CM | POA: Diagnosis present

## 2020-07-23 DIAGNOSIS — F312 Bipolar disorder, current episode manic severe with psychotic features: Secondary | ICD-10-CM | POA: Diagnosis present

## 2020-07-23 DIAGNOSIS — F4323 Adjustment disorder with mixed anxiety and depressed mood: Secondary | ICD-10-CM | POA: Diagnosis present

## 2020-07-23 DIAGNOSIS — F3130 Bipolar disorder, current episode depressed, mild or moderate severity, unspecified: Secondary | ICD-10-CM | POA: Diagnosis present

## 2020-07-23 LAB — BASIC METABOLIC PANEL
Anion gap: 15 (ref 5–15)
BUN: 18 mg/dL (ref 6–20)
CO2: 27 mmol/L (ref 22–32)
Calcium: 10.5 mg/dL — ABNORMAL HIGH (ref 8.9–10.3)
Chloride: 93 mmol/L — ABNORMAL LOW (ref 98–111)
Creatinine, Ser: 1.1 mg/dL — ABNORMAL HIGH (ref 0.44–1.00)
GFR, Estimated: >60 mL/min (ref >60)
Glucose, Bld: 128 mg/dL — ABNORMAL HIGH (ref 70–99)
Potassium: 3.4 mmol/L — ABNORMAL LOW (ref 3.5–5.1)
Sodium: 135 mmol/L (ref 135–145)

## 2020-07-23 LAB — CBC WITH DIFFERENTIAL/PLATELET
Abs Immature Granulocytes: 0.03 10*3/uL (ref 0.00–0.07)
Basophils Absolute: 0.1 10*3/uL (ref 0.0–0.1)
Basophils Relative: 1 %
Eosinophils Absolute: 0.1 10*3/uL (ref 0.0–0.5)
Eosinophils Relative: 1 %
HCT: 44.5 % (ref 36.0–46.0)
Hemoglobin: 15.7 g/dL — ABNORMAL HIGH (ref 12.0–15.0)
Immature Granulocytes: 0 %
Lymphocytes Relative: 21 %
Lymphs Abs: 2.3 10*3/uL (ref 0.7–4.0)
MCH: 30 pg (ref 26.0–34.0)
MCHC: 35.3 g/dL (ref 30.0–36.0)
MCV: 85.1 fL (ref 80.0–100.0)
Monocytes Absolute: 0.9 10*3/uL (ref 0.1–1.0)
Monocytes Relative: 8 %
Neutro Abs: 7.7 10*3/uL (ref 1.7–7.7)
Neutrophils Relative %: 69 %
Platelets: 421 10*3/uL — ABNORMAL HIGH (ref 150–400)
RBC: 5.23 MIL/uL — ABNORMAL HIGH (ref 3.87–5.11)
RDW: 12.2 % (ref 11.5–15.5)
WBC: 11 10*3/uL — ABNORMAL HIGH (ref 4.0–10.5)
nRBC: 0 % (ref 0.0–0.2)

## 2020-07-23 LAB — MAGNESIUM: Magnesium: 2 mg/dL (ref 1.7–2.4)

## 2020-07-23 LAB — HEPATIC FUNCTION PANEL
ALT: 67 U/L — ABNORMAL HIGH (ref 0–44)
AST: 63 U/L — ABNORMAL HIGH (ref 15–41)
Albumin: 5 g/dL (ref 3.5–5.0)
Alkaline Phosphatase: 69 U/L (ref 38–126)
Bilirubin, Direct: 0.4 mg/dL — ABNORMAL HIGH (ref 0.0–0.2)
Indirect Bilirubin: 1.6 mg/dL — ABNORMAL HIGH (ref 0.3–0.9)
Total Bilirubin: 2 mg/dL — ABNORMAL HIGH (ref 0.3–1.2)
Total Protein: 9 g/dL — ABNORMAL HIGH (ref 6.5–8.1)

## 2020-07-23 LAB — RESP PANEL BY RT-PCR (FLU A&B, COVID) ARPGX2
Influenza A by PCR: NEGATIVE
Influenza B by PCR: NEGATIVE
SARS Coronavirus 2 by RT PCR: NEGATIVE

## 2020-07-23 LAB — TSH: TSH: 4.304 u[IU]/mL (ref 0.350–4.500)

## 2020-07-23 LAB — CK: Total CK: 75 U/L (ref 38–234)

## 2020-07-23 LAB — PHOSPHORUS: Phosphorus: 3.8 mg/dL (ref 2.5–4.6)

## 2020-07-23 LAB — LIPASE, BLOOD: Lipase: 93 U/L — ABNORMAL HIGH (ref 11–51)

## 2020-07-23 MED ORDER — OXYCODONE HCL ER 10 MG PO T12A: 10.0000 mg | EXTENDED_RELEASE_TABLET | Freq: Two times a day (BID) | ORAL | Status: DC

## 2020-07-23 MED ORDER — GABAPENTIN 300 MG PO CAPS
300.0000 mg | ORAL_CAPSULE | ORAL | Status: AC
  Administered 2020-07-23: 300 mg via ORAL
  Filled 2020-07-23: qty 1

## 2020-07-23 MED ORDER — LORAZEPAM 2 MG/ML IJ SOLN: 1.0000 mg | Freq: Once | INTRAMUSCULAR | Status: DC

## 2020-07-23 MED ORDER — OXYCODONE HCL ER 10 MG PO T12A
10.0000 mg | EXTENDED_RELEASE_TABLET | Freq: Two times a day (BID) | ORAL | Status: DC
  Administered 2020-07-24: 10 mg via ORAL
  Filled 2020-07-23: qty 1

## 2020-07-23 MED ORDER — DIAZEPAM 5 MG PO TABS: 10.0000 mg | ORAL_TABLET | Freq: Two times a day (BID) | ORAL | Status: DC | PRN

## 2020-07-23 MED ORDER — GABAPENTIN 300 MG PO CAPS
300.0000 mg | ORAL_CAPSULE | Freq: Three times a day (TID) | ORAL | Status: DC
  Administered 2020-07-24: 300 mg via ORAL
  Filled 2020-07-23: qty 1

## 2020-07-23 MED ORDER — OXYCODONE-ACETAMINOPHEN 5-325 MG PO TABS
1.0000 | ORAL_TABLET | ORAL | Status: DC | PRN
  Administered 2020-07-24: 1 via ORAL
  Filled 2020-07-23: qty 1

## 2020-07-23 MED ORDER — LACTATED RINGERS IV BOLUS
1000.0000 mL | Freq: Once | INTRAVENOUS | Status: AC
  Administered 2020-07-23: 1000 mL via INTRAVENOUS

## 2020-07-23 MED ORDER — GABAPENTIN 300 MG PO CAPS: 300.0000 mg | ORAL_CAPSULE | Freq: Three times a day (TID) | ORAL | Status: DC

## 2020-07-23 MED ORDER — HYDROMORPHONE HCL 1 MG/ML IJ SOLN
1.0000 mg | Freq: Once | INTRAMUSCULAR | Status: AC
  Administered 2020-07-23: 1 mg via INTRAVENOUS
  Filled 2020-07-23: qty 1

## 2020-07-23 NOTE — ED Triage Notes (Signed)
Pt reports in the past couple weeks that she has had a fall with leg pain and diagnosed with kidney failure and severe dehydration, pt needing assistance getting out of the car, states that her legs are hurting, pt seems slow to answer. States that she feels like she may be dehydration

## 2020-07-23 NOTE — ED Notes (Signed)
md in with pt and family.   

## 2020-07-23 NOTE — ED Notes (Signed)
Pt alert.  Family with pt.  Iv in place.

## 2020-07-23 NOTE — ED Notes (Signed)
Pt states she is feeling better.  Iv in place.

## 2020-07-23 NOTE — ED Notes (Signed)
Pt alert, family with pt.

## 2020-07-23 NOTE — ED Notes (Signed)
Pt to mri 

## 2020-07-24 DIAGNOSIS — F4323 Adjustment disorder with mixed anxiety and depressed mood: Secondary | ICD-10-CM

## 2020-07-24 MED ORDER — GABAPENTIN 300 MG PO CAPS: 1.0000 | ORAL_CAPSULE | Freq: Three times a day (TID) | ORAL | 0 refills | Status: DC | PRN

## 2020-07-24 MED ORDER — OXYCODONE-ACETAMINOPHEN 5-325 MG PO TABS: 1.0000 | ORAL_TABLET | Freq: Three times a day (TID) | ORAL | 0 refills | Status: AC | PRN

## 2020-07-24 NOTE — ED Notes (Signed)
Mother at bedside and given numbers for pain clinic, ECT referral, and address to get wheelchair. Mother pulling car around and taking pt home with prescriptions and referrals.

## 2020-07-24 NOTE — ED Notes (Signed)
PT at bedside.

## 2020-07-24 NOTE — Consult Note (Signed)
Brief note Full note to follow.  Patient does not currently meet commitment criteria and is not suicidal or psychotic and does not require inpatient psychiatric treatment.  Patient may be released from the emergency room to follow up with their regular outpatient psychiatric provider

## 2020-07-24 NOTE — ED Notes (Signed)
Pt sleeping. 

## 2020-07-24 NOTE — ED Provider Notes (Signed)
Emergency Medicine Observation Re-evaluation Note  Carmen Daniels is a 35 y.o. female, seen on rounds today.  Pt initially presented to the ED for complaints of Weakness and Leg Pain Currently, the patient is resting comfortably.  Physical Exam  BP (!) 142/96   Pulse 98   Temp 98.1 F (36.7 C)   Resp 16   Ht 5\' 11"  (1.803 m)   Wt 90.7 kg   LMP  (LMP Unknown)   SpO2 98%   BMI 27.89 kg/m  Physical Exam Gen: No acute distress  Resp: Normal rise and fall of chest Neuro: Moving all four extremities Psych: Resting currently, calm and cooperative when awake    ED Course / MDM  EKG:   I have reviewed the labs performed to date as well as medications administered while in observation.  Recent changes in the last 24 hours include no acute events overnight.  Plan  Current plan is for TTS, psychiatry and palliative care consults for further disposition. Patient is not under full IVC at this time.   Sera Hitsman, [name], DO 07/24/20 [phone]

## 2020-07-24 NOTE — Progress Notes (Cosign Needed)
Patient suffers from severe deconditioning from fall which impairs their ability to perform daily activities like bathing, dressing, feeding, grooming, and toileting in the home. A walker will not resolve issue with performing activities of daily living. A wheelchair will allow patient to safely perform daily activities. Patient can safely propel the wheelchair in the home or has a caregiver who can provide assistance. Length of need 6 months .  Accessories: elevating leg rests (ELRs), wheel locks, extensions and anti-tippers.

## 2020-07-24 NOTE — Progress Notes (Signed)
Palliative medicine consult received. I have reviewed Ms. Donlan chart in detail. Request for pain management in a young woman with extensive psychiatric disease, no known life limiting illness other than being very high risk for psychiatric instability and polypharmacy. I can also find no reason for her to be on chronic opioids. She may benefit from an L4 nerve root injection if her complaints are related to back pain.Please refer to pain management specialist.  Lane Hacker, DO Palliative Medicine [phone]

## 2020-07-24 NOTE — ED Notes (Signed)
Report off to annie rn  Pt to 19h

## 2020-07-24 NOTE — ED Notes (Signed)
Pt's mother said she will be here as soon as she can. Per SW, cleared for d/c.

## 2020-07-24 NOTE — Consult Note (Signed)
[facility] Face-to-Face Psychiatry Consult   Patient Identification: Carmen Daniels MRN:  [id number] Principal Diagnosis: <principal problem not specified> Diagnosis:  Active Problems:   Mild persistent asthma   Bipolar I disorder, current or most recent episode manic, with psychotic features ([place])   Bipolar disorder ([place])   Borderline personality disorder ([place])   AMS (altered mental status)   Bipolar 1 disorder ([place])   Adjustment disorder with mixed anxiety and depressed mood   Bipolar I disorder, most recent episode depressed ([place])   Social anxiety disorder   Tardive dyskinesia   Acute renal failure ([place])   Total Time spent with patient: 20 minutes  Subjective: "I am not suicidal or homicidal.  I am here because of my [place] disease." Carmen Daniels is a 35 y.o. female patient presented to  [facility] ED via POV voluntarily. The patient family suggested that she consult with Dr. Weber Cooks to see whether she meets the criteria for ECT treatment. The patient was seen face-to-face by this provider; the chart was reviewed and consulted with Dr. Leonides Schanz on 07/24/2020 due to the patient's care. It was discussed with the EDP that the patient remained under observation overnight and will be reassessed in the a.m. to determine if she meets the criteria for psychiatric inpatient admission; she could be discharged home. On evaluation, the patient is alert and oriented x 4, calm and cooperative, and mood-congruent with affect. The patient does not appear to be responding to internal or external stimuli. Neither is the patient presenting with any delusional thinking. The patient denies auditory or visual hallucinations. The patient denies any suicidal, homicidal, or self-harm ideations. The patient is not presenting with any psychotic or paranoid behaviors. During an encounter with the patient, she was able to answer questions appropriately.  Past Psychiatric History: bipolar disorder, borderline personality disorder,  anxiety, depression, and SI/SA  Risk to Self:   Risk to Others:   Prior Inpatient Therapy:   Prior Outpatient Therapy:    Past Medical History:  Past Medical History:  Diagnosis Date  . Acute respiratory failure with hypoxia ([place])   . Acute rhinosinusitis 07/03/2018  . Allergic rhinitis   . Anxiety   . Asthma   . Back pain   . Bipolar disorder ([place])   . Borderline personality disorder ([place])   . Bupropion overdose   . Chronic pain   . Chronic pain syndrome   . Depression   . Fibromyalgia    generalized  . Hallucinations   . Migraines   . Nasal polyps   . Nausea   . Neck pain   . OCD (obsessive compulsive disorder)   . OCD (obsessive compulsive disorder)   . Ovarian cyst    left  . Pneumonia    2021  . PONV (postoperative nausea and vomiting)   . Schizophrenia ([place])    schizoaffective    Past Surgical History:  Procedure Laterality Date  . ABDOMINAL HYSTERECTOMY    . LAPAROSCOPIC TUBAL LIGATION Bilateral 06/13/2019   Procedure: LAPAROSCOPIC TUBAL LIGATION;  Surgeon: Chancy Milroy, MD;  Location: [facility];  Service: Gynecology;  Laterality: Bilateral;  FILSHIE CLIPS  . NASAL SINUS SURGERY    . TUBAL LIGATION    . tubes in ears    . VAGINAL HYSTERECTOMY Bilateral 01/15/2020   Procedure: HYSTERECTOMY VAGINAL;  Surgeon: Chancy Milroy, MD;  Location: [facility];  Service: Gynecology;  Laterality: Bilateral;  . WISDOM TOOTH EXTRACTION     Family History:  Family History  Problem Relation Age of Onset  . Healthy Father   . Healthy Mother   . Arthritis Maternal Grandmother   . Arthritis Maternal Grandfather   . Depression Paternal Grandmother    Family Psychiatric  History: Paternal grandmother depression Social History:  Social History   Substance and Sexual Activity  Alcohol Use Never     Social History   Substance and Sexual Activity  Drug Use No    Social History   Socioeconomic History  . Marital status: Married    Spouse name:  Not on file  . Number of children: 0  . Years of education: 10  . Highest education level: Not on file  Occupational History  . Occupation: Unemployed  Tobacco Use  . Smoking status: Current Every Day Smoker    Packs/day: 0.50    Years: 15.00    Pack years: 7.50    Types: Cigarettes  . Smokeless tobacco: Never Used  Vaping Use  . Vaping Use: Every day  . Substances: Nicotine  Substance and Sexual Activity  . Alcohol use: Never  . Drug use: No  . Sexual activity: Not on file  Other Topics Concern  . Not on file  Social History Narrative   Lives at home alone.   Right-handed.   3 glasses of green tea per day.   Social Determinants of Health   Financial Resource Strain: Not on file  Food Insecurity: Not on file  Transportation Needs: Not on file  Physical Activity: Not on file  Stress: Not on file  Social Connections: Not on file   Additional Social History:    Allergies:   Allergies  Allergen Reactions  . Keflex [Cephalexin] Anaphylaxis    Labs:  Results for orders placed or performed during the hospital encounter of 07/23/20 (from the past 48 hour(s))  Lipase, blood     Status: Abnormal   Collection Time: 07/23/20  5:41 PM  Result Value Ref Range   Lipase 93 (H) 11 - 51 U/L    Comment: Performed at [facility], [address] [place], [place] [address]  Basic metabolic panel     Status: Abnormal   Collection Time: 07/23/20  5:41 PM  Result Value Ref Range   Sodium 135 135 - 145 mmol/L   Potassium 3.4 (L) 3.5 - 5.1 mmol/L   Chloride 93 (L) 98 - 111 mmol/L   CO2 27 22 - 32 mmol/L   Glucose, Bld 128 (H) 70 - 99 mg/dL    Comment: Glucose reference range applies only to samples taken after fasting for at least 8 hours.   BUN 18 6 - 20 mg/dL   Creatinine, Ser 1.10 (H) 0.44 - 1.00 mg/dL   Calcium 10.5 (H) 8.9 - 10.3 mg/dL   GFR, Estimated >60 >60 mL/min    Comment: (NOTE) Calculated using the CKD-EPI Creatinine Equation (2021)    Anion gap 15 5  - 15    Comment: Performed at [facility], [address] [place], [place] [address]  Hepatic function panel     Status: Abnormal   Collection Time: 07/23/20  5:41 PM  Result Value Ref Range   Total Protein 9.0 (H) 6.5 - 8.1 g/dL   Albumin 5.0 3.5 - 5.0 g/dL   AST 63 (H) 15 - 41 U/L   ALT 67 (H) 0 - 44 U/L   Alkaline Phosphatase 69 38 - 126 U/L   Total Bilirubin 2.0 (H) 0.3 - 1.2 mg/dL   Bilirubin, Direct 0.4 (  H) 0.0 - 0.2 mg/dL   Indirect Bilirubin 1.6 (H) 0.3 - 0.9 mg/dL    Comment: Performed at [facility], [address] [place], [place] [address]  CBC with Differential     Status: Abnormal   Collection Time: 07/23/20  5:41 PM  Result Value Ref Range   WBC 11.0 (H) 4.0 - 10.5 K/uL   RBC 5.23 (H) 3.87 - 5.11 MIL/uL   Hemoglobin 15.7 (H) 12.0 - 15.0 g/dL   HCT 44.5 36.0 - 46.0 %   MCV 85.1 80.0 - 100.0 fL   MCH 30.0 26.0 - 34.0 pg   MCHC 35.3 30.0 - 36.0 g/dL   RDW 12.2 11.5 - 15.5 %   Platelets 421 (H) 150 - 400 K/uL   nRBC 0.0 0.0 - 0.2 %   Neutrophils Relative % 69 %   Neutro Abs 7.7 1.7 - 7.7 K/uL   Lymphocytes Relative 21 %   Lymphs Abs 2.3 0.7 - 4.0 K/uL   Monocytes Relative 8 %   Monocytes Absolute 0.9 0.1 - 1.0 K/uL   Eosinophils Relative 1 %   Eosinophils Absolute 0.1 0.0 - 0.5 K/uL   Basophils Relative 1 %   Basophils Absolute 0.1 0.0 - 0.1 K/uL   Immature Granulocytes 0 %   Abs Immature Granulocytes 0.03 0.00 - 0.07 K/uL    Comment: Performed at [facility], [address] [place], [place] [address]  CK     Status: None   Collection Time: 07/23/20  5:41 PM  Result Value Ref Range   Total CK 75 38 - 234 U/L    Comment: Performed at [facility], [address], [place], [place] [address]  Magnesium     Status: None   Collection Time: 07/23/20  5:41 PM  Result Value Ref Range   Magnesium 2.0 1.7 - 2.4 mg/dL    Comment: Performed at [facility], [address], [place], [place] [address]  Phosphorus      Status: None   Collection Time: 07/23/20  5:41 PM  Result Value Ref Range   Phosphorus 3.8 2.5 - 4.6 mg/dL    Comment: Performed at [facility], [address] [place], [place] [address]  TSH     Status: None   Collection Time: 07/23/20  5:41 PM  Result Value Ref Range   TSH 4.304 0.350 - 4.500 uIU/mL    Comment: Performed by a 3rd Generation assay with a functional sensitivity of <=0.01 uIU/mL. Performed at [facility] [address] [place], [place] [address]   Resp Panel by RT-PCR (Flu A&B, Covid) Nasopharyngeal Swab     Status: None   Collection Time: 07/23/20  6:26 PM   Specimen: Nasopharyngeal Swab; Nasopharyngeal(NP) swabs in vial transport medium  Result Value Ref Range   SARS Coronavirus 2 by RT PCR NEGATIVE NEGATIVE    Comment: (NOTE) SARS-CoV-2 target nucleic acids are NOT DETECTED.  The SARS-CoV-2 RNA is generally detectable in upper respiratory specimens during the acute phase of infection. The lowest concentration of SARS-CoV-2 viral copies this assay can detect is 138 copies/mL. A negative result does not preclude SARS-Cov-2 infection and should not be used as the sole basis for treatment or other patient management decisions. A negative result may occur with  improper specimen collection/handling, submission of specimen other than nasopharyngeal swab, presence of viral mutation(s) within the areas targeted by this assay, and inadequate number of viral copies(<138 copies/mL). A negative result must be combined  with clinical observations, patient history, and epidemiological information. The expected result is Negative.  Fact Sheet for Patients:  EntrepreneurPulse.com.au  Fact Sheet for Healthcare Providers:  IncredibleEmployment.be  This test is no t yet approved or cleared by the [place] FDA and  has been authorized for detection and/or diagnosis of SARS-CoV-2 by FDA under an Emergency Use  Authorization (EUA). This EUA will remain  in effect (meaning this test can be used) for the duration of the COVID-19 declaration under Section 564(b)(1) of the Act, 21 U.S.C.section 360bbb-3(b)(1), unless the authorization is terminated  or revoked sooner.       Influenza A by PCR NEGATIVE NEGATIVE   Influenza B by PCR NEGATIVE NEGATIVE    Comment: (NOTE) The Xpert Xpress SARS-CoV-2/FLU/RSV plus assay is intended as an aid in the diagnosis of influenza from Nasopharyngeal swab specimens and should not be used as a sole basis for treatment. Nasal washings and aspirates are unacceptable for Xpert Xpress SARS-CoV-2/FLU/RSV testing.  Fact Sheet for Patients: EntrepreneurPulse.com.au  Fact Sheet for Healthcare Providers: IncredibleEmployment.be  This test is not yet approved or cleared by the [place] FDA and has been authorized for detection and/or diagnosis of SARS-CoV-2 by FDA under an Emergency Use Authorization (EUA). This EUA will remain in effect (meaning this test can be used) for the duration of the COVID-19 declaration under Section 564(b)(1) of the Act, 21 U.S.C. section 360bbb-3(b)(1), unless the authorization is terminated or revoked.  Performed at [facility], [address] [place], [place] [address]     Current Facility-Administered Medications  Medication Dose Route Frequency Provider Last Rate Last Admin  . diazepam (VALIUM) tablet 10 mg  10 mg Oral Q12H PRN Carrie Mew, MD      . gabapentin (NEURONTIN) capsule 300 mg  300 mg Oral TID Carrie Mew, MD      . oxyCODONE (OXYCONTIN) 12 hr tablet 10 mg  10 mg Oral Q12H Carrie Mew, MD      . oxyCODONE-acetaminophen (PERCOCET/ROXICET) 5-325 MG per tablet 1 tablet  1 tablet Oral Q4H PRN Carrie Mew, MD   1 tablet at 07/24/20 [id number]   Current Outpatient Medications  Medication Sig Dispense Refill  . ARIPiprazole (ABILIFY) 5 MG tablet TAKE 1  TABLET (5 MG TOTAL) BY MOUTH DAILY. 30 tablet 2  . beclomethasone (QVAR) 40 MCG/ACT inhaler Inhale 1 puff into the lungs 2 (two) times daily as needed (asthma).     . botulinum toxin Type A (BOTOX) 100 units SOLR injection Inject 200 Units into the muscle every 3 (three) months. Inject into head and neck muscles 2 each 3  . clonazePAM (KLONOPIN) 1 MG tablet Take 1 mg by mouth 4 (four) times daily.    . cyclobenzaprine (FLEXERIL) 5 MG tablet Take 1 tablet (5 mg total) by mouth 3 (three) times daily. 90 tablet 0  . DULoxetine (CYMBALTA) 30 MG capsule Take 1 capsule (30 mg total) by mouth daily. 30 capsule 0  . Fremanezumab-vfrm (AJOVY) 225 MG/1.5ML SOAJ Inject 225 mg into the skin every 30 (thirty) days. 1.5 mL 11  . gabapentin (NEURONTIN) 300 MG capsule Take 1 capsule by mouth 3 (three) times daily as needed for pain.    . Multiple Vitamins-Calcium (ONE-A-DAY WOMENS FORMULA PO) Take 1 tablet by mouth daily.    . SUMAtriptan (IMITREX) 100 MG tablet Take 1 tablet (100 mg total) by mouth every 2 (two) hours as needed for migraine. MAY DOSE 2 PER DAY OR 3 12 tablet 11  .  traMADol (ULTRAM) 50 MG tablet Take 50 mg by mouth 4 (four) times daily as needed for pain.    . traZODone (DESYREL) 100 MG tablet Take 1 tablet (100 mg total) by mouth at bedtime. 30 tablet 0  . AUSTEDO 9 MG TABS TAKE 2 TABLETS BY MOUTH TWICE A DAY (Patient not taking: Reported on 06/30/2020) 120 tablet 2    Musculoskeletal: Strength & Muscle Tone: within normal limits Gait & Station: normal Patient leans: N/A   Psychiatric Specialty Exam:  Presentation  General Appearance: Disheveled; Appropriate for Environment  Eye Contact:Minimal  Speech:Clear and Coherent; Normal Rate  Speech Volume:Normal  Handedness:Right   Mood and Affect  Mood:Irritable  Affect:Congruent; Blunt   Thought Process  Thought Processes:Coherent; Linear  Descriptions of Associations:Tangential  Orientation:Full (Time, Place and  Person)  Thought Content:Logical  History of Schizophrenia/Schizoaffective disorder:No data recorded Duration of Psychotic Symptoms:No data recorded Hallucinations:No data recorded  Ideas of Reference:None  Suicidal Thoughts:No data recorded  Homicidal Thoughts:No data recorded   Sensorium  Memory:Immediate Fair; Recent Fair; Remote Fair  Judgment:Fair  Insight:Shallow   Executive Functions  Concentration:Fair  Attention Span:Fair  [place]   Psychomotor Activity  Psychomotor Activity:No data recorded   Assets  Assets:Communication Skills; Physical Health; Resilience; Social Support; Housing; Catering manager; Desire for Improvement   Sleep  Sleep:No data recorded   Physical Exam: Physical Exam Vitals and nursing note reviewed.  Constitutional:      Appearance: Normal appearance. She is normal weight.  HENT:     Head: Normocephalic.     Nose: Nose normal.  Neurological:     General: No focal deficit present.     Mental Status: She is alert and oriented to person, place, and time. Mental status is at baseline.  Psychiatric:        Attention and Perception: Attention and perception normal.        Mood and Affect: Mood is depressed. Affect is blunt.        Behavior: Behavior normal.        Thought Content: Thought content normal.        Judgment: Judgment normal.    Review of Systems  Constitutional: Positive for weight loss.  Psychiatric/Behavioral: Positive for depression. Negative for hallucinations, memory loss, substance abuse and suicidal ideas. The patient is not nervous/anxious and does not have insomnia.   All other systems reviewed and are negative.  Blood pressure (!) 142/96, pulse 98, temperature 98.1 F (36.7 C), resp. rate 16, height 5\' 11"  (1.803 m), weight 90.7 kg, SpO2 98 %. Body mass index is 27.89 kg/m.  Treatment Plan Summary: Plan See below  The patient remained under  observation overnight and will be reassessed in the a.m. to determine if she meets the criteria for psychiatric inpatient admission; she could be discharged home. -Recommend referral to intensive outpatient programming for coping skills management, and behavior modifications.  -Consider referral to ECT provider, for evaluation.   Disposition: No evidence of imminent risk to self or others at present.   Supportive therapy provided about ongoing stressors. Refer to IOP. The patient remained under observation overnight and will be reassessed in the a.m. to determine if she meets the criteria for psychiatric inpatient admission; she could be discharged home.  Caroline Sauger, NP 07/24/2020 7:16 AM

## 2020-07-24 NOTE — ED Notes (Signed)
Dr.Clapacs at bedside  

## 2020-07-24 NOTE — ED Provider Notes (Signed)
Patient suffers from severe deconditioning from fall which impairs their ability to perform daily activities like bathing, dressing, feeding, grooming, and toileting in the home. A walker will not resolve issue with performing activities of daily living. A wheelchair will allow patient to safely perform daily activities. Patient can safely propel the wheelchair in the home or has a caregiver who can provide assistance. Length of need 6 months .  Accessories: elevating leg rests (ELRs), wheel locks, extensions and anti-tippers.   Lucrezia Starch, MD 07/24/20 1201

## 2020-07-24 NOTE — ED Provider Notes (Signed)
-----------------------------------------   12:38 AM on 07/24/2020 -----------------------------------------  MRI thoracic spine interpreted per Dr. Collins Scotland: Normal thoracic spine.  MRI lumbar spine interpreted per Dr. Collins Scotland: Mild lower lumbar facet arthrosis with left foraminal annular  fissure at L4-5. This could irritate the left L4 nerve root. No  foraminal stenosis.   Abdominal ultrasound RUQ interpreted per Dr. Owens Shark:  1. Gallbladder sludge. No evidence of cholelithiasis or  cholecystitis.  2. Otherwise unremarkable exam.   Imaging studies unremarkable for acute injury.  Patient will board in the ED pending psychiatry, social work and palliative care consults.   ----------------------------------------- 4:18 AM on 07/24/2020 -----------------------------------------   Patient seen by psychiatric NP who plans to have Dr. [name] Collum reassess in the morning.   Paulette Blanch, MD 07/24/20 [phone]

## 2020-07-24 NOTE — ED Provider Notes (Signed)
Patient seen by Dr. Weber Cooks and cleared for discharge.  Patient will get an order for wheelchair assist with DME identified for social work.  Refill pain medicines prescribed.  Discharged in stable condition.   Lucrezia Starch, MD 07/24/20 1213

## 2020-07-24 NOTE — Evaluation (Signed)
Physical Therapy Evaluation Patient Details Name: Carmen Daniels MRN: [id number] DOB: [birth year] Today's Date: 07/24/2020   History of Present Illness  Pt admitted for acute renal failure. Complaints of initial fall 2 weeks ago with limited mobility in past week. History includes kidney failure, schizophrenia, bipolar, and tardive dyskinesia.  Clinical Impression  Pt is a pleasant 35 year old female who was admitted for acute renal failure. Pt performs bed mobility with independence and able to perform transfers with min assist and RW. Unable to ambulate due to pain. Would benefit from [facility] for further independence with mobility. Agreeable to perform OP PT to address deficits. Pt demonstrates deficits with strength/pain/mobility. Would benefit from skilled PT to address above deficits and promote optimal return to PLOF.     Follow Up Recommendations Supervision for mobility/OOB;Outpatient PT    Equipment Recommendations  Wheelchair (measurements PT)    Recommendations for Other Services       Precautions / Restrictions Precautions Precautions: Fall Precaution Comments: R foot drop Restrictions Weight Bearing Restrictions: No      Mobility  Bed Mobility Overal bed mobility: Independent             General bed mobility comments: safe technique, however does take increased time due to pain    Transfers Overall transfer level: Needs assistance Equipment used: Rolling walker (2 wheeled);4-wheeled walker Transfers: Sit to/from Stand Sit to Stand: Min assist         General transfer comment: 2 transfers performed with pt only able to tolerate for 10-20 seconds prior to pain and B calf cramping.  Ambulation/Gait             General Gait Details: unable to ambulate due to pain  Stairs            Wheelchair Mobility    Modified Rankin (Stroke Patients Only)       Balance Overall balance assessment: Needs assistance Sitting-balance support: No upper  extremity supported;Feet supported Sitting balance-Leahy Scale: Good     Standing balance support: Bilateral upper extremity supported Standing balance-Leahy Scale: Fair                               Pertinent Vitals/Pain Pain Assessment: Faces Faces Pain Scale: Hurts whole lot Pain Location: bilateral calves Pain Descriptors / Indicators: Grimacing;Guarding Pain Intervention(s): Limited activity within patient's tolerance;Premedicated before session    Home Living Family/patient expects to be discharged to:: Private residence Living Arrangements: Parent Available Help at Discharge: Family;Available 24 hours/day Type of Home: House Home Access: Stairs to enter Entrance Stairs-Rails: None Entrance Stairs-Number of Steps: 1 Home Layout: One level Home Equipment: Walker - 2 wheels Additional Comments: has been recently living with parents    Prior Function Level of Independence: Needs assistance   Gait / Transfers Assistance Needed: has been minimally ambulatory in last 2 weeks. Using RW, however only performing transfers. Spending majority of time in bed.     Comments: prior to fall, was indep with ambulation     Hand Dominance        Extremity/Trunk Assessment   Upper Extremity Assessment Upper Extremity Assessment: Overall WFL for tasks assessed    Lower Extremity Assessment Lower Extremity Assessment: Generalized weakness (B LE grossly 3+5; unsure of full effort given)       Communication   Communication: No difficulties  Cognition Arousal/Alertness:  (sleepy) Behavior During Therapy: Flat affect Overall Cognitive Status: Within Functional Limits for  tasks assessed                                 General Comments: sleepy, however awakens with mobility efforts      General Comments      Exercises Other Exercises Other Exercises: supine ther-ex performed x 10 reps including knee flexion and SLRs. CGA given and reports of pain    Assessment/Plan    PT Assessment Patient needs continued PT services  PT Problem List Decreased strength;Decreased activity tolerance;Decreased balance;Decreased mobility;Decreased knowledge of use of DME;Cardiopulmonary status limiting activity;Impaired sensation;Pain       PT Treatment Interventions DME instruction;Gait training;Stair training;Functional mobility training;Therapeutic activities;Therapeutic exercise;Balance training;Patient/family education    PT Goals (Current goals can be found in the Care Plan section)  Acute Rehab PT Goals Patient Stated Goal: for pain to decrease PT Goal Formulation: With patient Time For Goal Achievement: 08/07/20 Potential to Achieve Goals: Fair    Frequency Min 2X/week   Barriers to discharge        Co-evaluation               AM-PAC PT "6 Clicks" Mobility  Outcome Measure Help needed turning from your back to your side while in a flat bed without using bedrails?: None Help needed moving from lying on your back to sitting on the side of a flat bed without using bedrails?: None Help needed moving to and from a bed to a chair (including a wheelchair)?: A Little Help needed standing up from a chair using your arms (e.g., wheelchair or bedside chair)?: A Little Help needed to walk in hospital room?: A Little Help needed climbing 3-5 steps with a railing? : Total 6 Click Score: 18    End of Session Equipment Utilized During Treatment: Gait belt Activity Tolerance: Patient limited by pain Patient left: in bed Nurse Communication: Mobility status PT Visit Diagnosis: Other abnormalities of gait and mobility (R26.89);Pain Pain - Right/Left:  (bilat) Pain - part of body: Leg    Time: 8891-6945 PT Time Calculation (min) (ACUTE ONLY): 20 min   Charges:   PT Evaluation $PT Eval Low Complexity: 1 Low PT Treatments $Therapeutic Exercise: 8-22 mins        Greggory Stallion, PT, DPT [phone]   Breaunna Gottlieb 07/24/2020,  12:23 PM

## 2020-07-24 NOTE — ED Provider Notes (Signed)
[facility] Emergency Department Provider Note  ____________________________________________  Time seen: Approximately 12:23 AM  I have reviewed the triage vital signs and the nursing notes.   HISTORY  Chief Complaint Weakness and Leg Pain    HPI Carmen Daniels is a 35 y.o. female with a history of chronic pain syndrome, fibromyalgia, OCD bipolar disorder schizoaffective  who was brought to the ED due to generalized weakness, poor oral intake, feeling dehydrated.  Has a history of severe dehydration resulting in fall, prolonged downtime, severe rhabdomyolysis a few weeks ago.  After being discharged, she was given outpatient referrals to pain management, psychiatry, physical therapy, but has been unable to complete any of these due to severe pain, weakness, unable to leave the house.  Mother at bedside reports that they have not received any calls or out reach from any of these referral services.  Mom does report that patient ran out of her pain medications including gabapentin, tramadol, Percocet, Klonopin about 3 days ago due to lack of follow-up   Reviewed notes from recent hospitalization including physical therapy evaluations.  After pain control, patient's leg weakness and functional limitations were much improved.  Unlikely to be from CNS lesion such as cord compression/cauda equina.  Past Medical History:  Diagnosis Date  . Acute respiratory failure with hypoxia ([place])   . Acute rhinosinusitis 07/03/2018  . Allergic rhinitis   . Anxiety   . Asthma   . Back pain   . Bipolar disorder ([place])   . Borderline personality disorder ([place])   . Bupropion overdose   . Chronic pain   . Chronic pain syndrome   . Depression   . Fibromyalgia    generalized  . Hallucinations   . Migraines   . Nasal polyps   . Nausea   . Neck pain   . OCD (obsessive compulsive disorder)   . OCD (obsessive compulsive disorder)   . Ovarian cyst    left  . Pneumonia    2021   . PONV (postoperative nausea and vomiting)   . Schizophrenia ([place])    schizoaffective     Patient Active Problem List   Diagnosis Date Noted  . Malnutrition of moderate degree 07/01/2020  . Acute renal failure ([place]) 06/30/2020  . Non-traumatic rhabdomyolysis   . Elevated LFTs   . Tardive dyskinesia 11/14/2019  . Bipolar I disorder, most recent episode depressed ([place]) 09/13/2019  . Social anxiety disorder 09/13/2019  . Adjustment disorder with mixed anxiety and depressed mood 07/31/2019  . Suicidal ideation   . Post-operative state 07/19/2019  . Seizures ([place]) 06/08/2019  . Bipolar 1 disorder ([place]) 05/17/2019  . Status epilepticus ([place]) 05/17/2019  . Intentional benzodiazepine overdose ([place]) 03/29/2019  . Intractable chronic 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 status migrainosus 02/08/2019  . History of elective abortion 12/28/2018  . QT prolongation 07/03/2018  . AMS (altered mental status) 07/03/2018  . Serum total bilirubin elevated 07/03/2018  . Syncope 07/02/2018  . Borderline personality disorder ([place]) 04/11/2017  . Bipolar I disorder, current or most recent episode manic, with psychotic features ([place]) 04/10/2017  . Bipolar disorder ([place]) 04/10/2017  . Abnormal MRI of head 04/21/2016  . Chronic migraine without aura 03/27/2015  . Mild persistent asthma 12/30/2014  . Allergic rhinitis due to pollen 12/30/2014  . 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 Nasal polyposis 12/30/2014     Past Surgical History:  Procedure Laterality Date  . ABDOMINAL HYSTERECTOMY    . LAPAROSCOPIC TUBAL LIGATION Bilateral 06/13/2019  Procedure: LAPAROSCOPIC TUBAL LIGATION;  Surgeon: Chancy Milroy, MD;  Location: [facility];  Service: Gynecology;  Laterality: Bilateral;  FILSHIE CLIPS  . NASAL SINUS SURGERY    . TUBAL LIGATION    . tubes in ears    . VAGINAL HYSTERECTOMY Bilateral 01/15/2020   Procedure: HYSTERECTOMY VAGINAL;  Surgeon: Chancy Milroy, MD;  Location: [facility];  Service: Gynecology;  Laterality: Bilateral;  . WISDOM TOOTH EXTRACTION       Prior to Admission medications   Medication Sig Start Date End Date Taking? Authorizing Provider  ARIPiprazole (ABILIFY) 5 MG tablet TAKE 1 TABLET (5 MG TOTAL) BY MOUTH DAILY. 06/19/20   Salley Slaughter, NP  AUSTEDO 9 MG TABS TAKE 2 TABLETS BY MOUTH TWICE A DAY Patient not taking: Reported on 06/30/2020 04/14/20   Salley Slaughter, NP  beclomethasone (QVAR) 40 MCG/ACT inhaler Inhale 1 puff into the lungs 2 (two) times daily as needed (asthma).     [provider]  botulinum toxin Type A (BOTOX) 100 units SOLR injection Inject 200 Units into the muscle every 3 (three) months. Inject into head and neck muscles 04/30/19   Marcial Pacas, MD  clonazePAM (KLONOPIN) 1 MG tablet Take 1 mg by mouth 4 (four) times daily. 07/14/19   [provider]  cyclobenzaprine (FLEXERIL) 5 MG tablet Take 1 tablet (5 mg total) by mouth 3 (three) times daily. 07/11/20 08/10/20  [name], MD  DULoxetine (CYMBALTA) 30 MG capsule Take 1 capsule (30 mg total) by mouth daily. 07/11/20   [name], MD  Fremanezumab-vfrm (AJOVY) 225 MG/1.5ML SOAJ Inject 225 mg into the skin every 30 (thirty) days. 12/12/19   Marcial Pacas, MD  gabapentin (NEURONTIN) 300 MG capsule Take 1 capsule by mouth 3 (three) times daily as needed for pain. 04/18/20   [provider]  Multiple Vitamins-Calcium (ONE-A-DAY WOMENS FORMULA PO) Take 1 tablet by mouth daily.    [provider]  SUMAtriptan (IMITREX) 100 MG tablet Take 1 tablet (100 mg total) by mouth every 2 (two) hours as needed for migraine. MAY DOSE 2 PER DAY OR 3 12/12/19   Marcial Pacas, MD  traMADol (ULTRAM) 50 MG tablet Take 50 mg by mouth 4 (four) times daily as needed for pain. 02/28/20   [provider]  traZODone (DESYREL) 100 MG tablet Take 1 tablet (100 mg total) by mouth at bedtime. 07/11/20   [name], MD     Allergies Keflex  [cephalexin]   Family History  Problem Relation Age of Onset  . Healthy Father   . Healthy Mother   . Arthritis Maternal Grandmother   . Arthritis Maternal Grandfather   . Depression Paternal Grandmother     Social History Social History   Tobacco Use  . Smoking status: Current Every Day Smoker    Packs/day: 0.50    Years: 15.00    Pack years: 7.50    Types: Cigarettes  . Smokeless tobacco: Never Used  Vaping Use  . Vaping Use: Every day  . Substances: Nicotine  Substance Use Topics  . Alcohol use: Never  . Drug use: No    Review of Systems  Constitutional:   No fever or chills.  ENT:   No sore throat. No rhinorrhea. Cardiovascular:   No chest pain or syncope. Respiratory:   No dyspnea or cough. Gastrointestinal:   Negative for abdominal pain, vomiting and diarrhea.  Musculoskeletal: Chronic bilateral peripheral neuropathy All other systems reviewed and are negative except  as documented above in ROS and HPI.  ____________________________________________   PHYSICAL EXAM:  VITAL SIGNS: ED Triage Vitals  Enc Vitals Group     BP 07/23/20 1726 (!) 114/56     Pulse Rate 07/23/20 1726 (!) 145     Resp 07/23/20 1726 16     Temp 07/23/20 1726 98.1 F (36.7 C)     Temp src --      SpO2 07/23/20 1726 100 %     Weight 07/23/20 1727 200 lb (90.7 kg)     Height 07/23/20 1727 5\' 11"  (1.803 m)     Head Circumference --      Peak Flow --      Pain Score --      Pain Loc --      Pain Edu? --      Excl. in [place]? --     Vital signs reviewed, nursing assessments reviewed.   Constitutional:   Alert and oriented. Non-toxic appearance.   Eyes:   Conjunctivae are normal. EOMI. PERRL. ENT      Head:   Normocephalic and atraumatic.      Nose:   Normal.      Mouth/Throat:   Moist mucosa, normal.      Neck:   No meningismus. Full ROM. Hematological/Lymphatic/Immunilogical:   No cervical lymphadenopathy. Cardiovascular:   RRR. Symmetric bilateral radial and DP pulses.  No  murmurs. Cap refill less than 2 seconds. Respiratory:   Normal respiratory effort without tachypnea/retractions. Breath sounds are clear and equal bilaterally. No wheezes/rales/rhonchi. Gastrointestinal:   Soft with mild epigastric tenderness. Non distended. There is no CVA tenderness.  No rebound, rigidity, or guarding. Genitourinary:   deferred Musculoskeletal:   Normal range of motion in all extremities. No joint effusions.  No lower extremity tenderness.  No edema. Neurologic:   Normal speech and language. Somber flat affect.  Masklike face Motor grossly intact. No acute focal neurologic deficits are appreciated.  Skin:    Skin is warm, dry and intact. No rash noted.  No petechiae, purpura, or bullae.  ____________________________________________    LABS (pertinent positives/negatives) (all labs ordered are listed, but only abnormal results are displayed) Labs Reviewed  LIPASE, BLOOD - Abnormal; Notable for the following components:      Result Value   Lipase 93 (*)    All other components within normal limits  BASIC METABOLIC PANEL - Abnormal; Notable for the following components:   Potassium 3.4 (*)    Chloride 93 (*)    Glucose, Bld 128 (*)    Creatinine, Ser 1.10 (*)    Calcium 10.5 (*)    All other components within normal limits  HEPATIC FUNCTION PANEL - Abnormal; Notable for the following components:   Total Protein 9.0 (*)    AST 63 (*)    ALT 67 (*)    Total Bilirubin 2.0 (*)    Bilirubin, Direct 0.4 (*)    Indirect Bilirubin 1.6 (*)    All other components within normal limits  CBC WITH DIFFERENTIAL/PLATELET - Abnormal; Notable for the following components:   WBC 11.0 (*)    RBC 5.23 (*)    Hemoglobin 15.7 (*)    Platelets 421 (*)    All other components within normal limits  RESP PANEL BY RT-PCR (FLU A&B, COVID) ARPGX2  CK  MAGNESIUM  PHOSPHORUS  TSH  URINALYSIS, COMPLETE (UACMP) WITH MICROSCOPIC  ACETAMINOPHEN LEVEL  ETHANOL  SALICYLATE LEVEL  POC  URINE PREG, ED   ____________________________________________  EKG  Interpreted by me Sinus tachycardia rate 146.  Normal axis and intervals.  Normal QRS ST segments and T waves.  ____________________________________________    RADIOLOGY  MR THORACIC SPINE WO CONTRAST  Result Date: 07/23/2020 CLINICAL DATA:  Mid back pain and myelopathy EXAM: MRI THORACIC SPINE WITHOUT CONTRAST TECHNIQUE: Multiplanar, multisequence MR imaging of the thoracic spine was performed. No intravenous contrast was administered. COMPARISON:  None. FINDINGS: Alignment:  Physiologic. Vertebrae: No fracture, evidence of discitis, or bone lesion. Cord:  Normal signal and morphology. Paraspinal and other soft tissues: Negative. Disc levels: No spinal canal or neural foraminal stenosis. IMPRESSION: Normal thoracic spine. Electronically Signed   By: Ulyses Jarred M.D.   On: 07/23/2020 23:29   MR LUMBAR SPINE WO CONTRAST  Result Date: 07/23/2020 CLINICAL DATA:  Low back pain and myelopathy EXAM: MRI LUMBAR SPINE WITHOUT CONTRAST TECHNIQUE: Multiplanar, multisequence MR imaging of the lumbar spine was performed. No intravenous contrast was administered. COMPARISON:  None. FINDINGS: Segmentation:  Standard. Alignment:  Physiologic. Vertebrae:  No fracture, evidence of discitis, or bone lesion. Conus medullaris and cauda equina: Conus extends to the L1-2 level. Conus and cauda equina appear normal. Paraspinal and other soft tissues: Negative Disc levels: Mild facet hypertrophy with left foraminal annular fissure at L4-5. No stenosis. Moderate L5-S1 facet hypertrophy without stenosis. The other disc levels are normal. IMPRESSION: Mild lower lumbar facet arthrosis with left foraminal annular fissure at L4-5. This could irritate the left L4 nerve root. No foraminal stenosis. Electronically Signed   By: Ulyses Jarred M.D.   On: 07/23/2020 23:33   US ABDOMEN LIMITED RUQ (LIVER/GB)  Result Date: 07/24/2020 CLINICAL DATA:  Epigastric  pain, elevated LFTs EXAM: ULTRASOUND ABDOMEN LIMITED RIGHT UPPER QUADRANT COMPARISON:  06/29/2020 FINDINGS: Gallbladder: Sludge layers dependently within the gallbladder. No shadowing gallstones. No gallbladder wall thickening or pericholecystic fluid. Negative Murphy sign. Common bile duct: Diameter: 2 mm Liver: No focal lesion identified. Within normal limits in parenchymal echogenicity. Portal vein is patent on color Doppler imaging with normal direction of blood flow towards the liver. Other: None. IMPRESSION: 1. Gallbladder sludge. No evidence of cholelithiasis or cholecystitis. 2. Otherwise unremarkable exam. Electronically Signed   By: Randa Ngo M.D.   On: 07/24/2020 00:20    ____________________________________________   PROCEDURES Procedures  ____________________________________________  DIFFERENTIAL DIAGNOSIS   Dehydration, electrolyte abnormality, rhabdomyolysis, opioid withdrawal, decompensated psychiatric illness, cholecystitis, choledocholithiasis, myelopathy  CLINICAL IMPRESSION / ASSESSMENT AND PLAN / ED COURSE  Medications ordered in the ED: Medications  diazepam (VALIUM) tablet 10 mg (has no administration in time range)  oxyCODONE-acetaminophen (PERCOCET/ROXICET) 5-325 MG per tablet 1 tablet (has no administration in time range)  gabapentin (NEURONTIN) capsule 300 mg (has no administration in time range)  oxyCODONE (OXYCONTIN) 12 hr tablet 10 mg (has no administration in time range)  lactated ringers bolus 1,000 mL (1,000 mLs Intravenous Bolus 07/23/20 1800)  HYDROmorphone (DILAUDID) injection 1 mg (1 mg Intravenous Given 07/23/20 2133)  gabapentin (NEURONTIN) capsule 300 mg (300 mg Oral Given 07/23/20 2133)    Pertinent labs & imaging results that were available during my care of the patient were reviewed by me and considered in my medical decision making (see chart for details).  Carmen Daniels was evaluated in Emergency Department on 07/24/2020 for the symptoms  described in the history of present illness. She was evaluated in the context of the global COVID-19 pandemic, which necessitated consideration that the patient might be at risk for infection with the SARS-CoV-2 virus that  causes COVID-19. Institutional protocols and algorithms that pertain to the evaluation of patients at risk for COVID-19 are in a state of rapid change based on information released by regulatory bodies including the CDC and federal and state organizations. These policies and algorithms were followed during the patient's care in the ED.   Patient presents with persistent generalized weakness for the past several weeks.  After her recent hospitalization, she has not received any home physical therapy or other services, been unable to follow-up.  She has remained in bed, debilitated, poor oral intake.  Unclear the cause of this, but mother is having difficulty caring for the patient in the state.    Lab panel is overall unremarkable, normal creatinine and CK level, TSH normal, COVID and flu negative.  LFTs are slightly elevated, but ultrasound is unremarkable.  I viewed and interpreted the ultrasound images, shows some sludge but no evidence of cholecystitis or choledocholithiasis.  Will restart her chronic pain management regimen, add on low-dose OxyContin as well for more consistent oral medication management.  We will consult palliative care to assist with chronic pain regimen until she can set up outpatient follow-up.  Will consult psychiatry for evaluation of her symptoms. Her psychiatric conditions have been unresponsive to oral medications thus far.  Reportedly she has been recommended to pursue ECT evaluation but is unable to complete outpatient follow-up in her current condition.  Will consult physical therapy and social work team.      ____________________________________________   FINAL CLINICAL IMPRESSION(S) / ED DIAGNOSES    Final diagnoses:  Generalized  weakness  Dehydration  Chronic pain syndrome     ED Discharge Orders    None      Portions of this note were generated with dragon dictation software. Dictation errors may occur despite best attempts at proofreading.   Carrie Mew, MD 07/24/20 [phone]

## 2020-07-24 NOTE — ED Notes (Signed)
Pt is not psych hold-has own clothes and belongings at bedside. Sleeping in NAD at this time.

## 2020-07-24 NOTE — ED Notes (Signed)
Pt states that she doesn't want hospital food and that her mother is bringing her breakfast later.

## 2020-07-24 NOTE — ED Provider Notes (Signed)
3:77 AM  Dr. Weber Cooks to reassess in AM for possible ECT referral.   Carmen Daniels, [name], DO 07/24/20 1962

## 2020-07-28 ENCOUNTER — Telehealth (HOSPITAL_COMMUNITY): Payer: Self-pay | Admitting: *Deleted

## 2020-07-28 NOTE — Telephone Encounter (Signed)
Patients Mother called with concern about  The patient  &  To inform patient has been hospitalized. Patient had a fall on 06-27-20 hitting her head & was unconscious  for several hrs . Patient hasn't been eating/drinking as of late & was dehydrated which may have caused fall. Bad foot injury as result & patient continues not to eat/drink. Mom thinks patient may be depress & noted that patient stop taking her med's x 1 month ago.Mom states she wanted to speak with provider doesn't know what to do & is concerned. Call back went straight to VM. LVM

## 2020-07-28 NOTE — Telephone Encounter (Signed)
Note sent to provider about mom concern & update

## 2020-07-29 NOTE — Telephone Encounter (Signed)
Provider attempted to contact patient and her mother without success.  Provider unable to leave a voicemail because her messages were filled.  The patient nor her mother calls back that they can reschedule a sooner appointment or walk-in doing providers walk-in hours on Monday and Tuesdays between the hours of 8 AM and 10 AM.

## 2020-08-01 ENCOUNTER — Telehealth (HOSPITAL_COMMUNITY): Payer: Self-pay | Admitting: Psychiatry

## 2020-08-01 NOTE — Telephone Encounter (Signed)
Mother, Arlie Solomons, [phone], called requesting Dr. Ronne Binning call her regarding pt.

## 2020-08-05 ENCOUNTER — Other Ambulatory Visit: Payer: Self-pay

## 2020-08-05 ENCOUNTER — Telehealth (HOSPITAL_COMMUNITY): Payer: Self-pay | Admitting: *Deleted

## 2020-08-05 ENCOUNTER — Encounter (HOSPITAL_COMMUNITY): Payer: Self-pay

## 2020-08-05 ENCOUNTER — Emergency Department (HOSPITAL_COMMUNITY)
Admission: EM | Admit: 2020-08-05 | Discharge: 2020-08-06 | Disposition: A | Discharge status: Home / Self Care | Payer: Medicaid Other | Attending: Emergency Medicine | Admitting: Emergency Medicine

## 2020-08-05 DIAGNOSIS — F319 Bipolar disorder, unspecified: Secondary | ICD-10-CM

## 2020-08-05 DIAGNOSIS — Z20822 Contact with and (suspected) exposure to covid-19: Secondary | ICD-10-CM | POA: Insufficient documentation

## 2020-08-05 DIAGNOSIS — J45909 Unspecified asthma, uncomplicated: Secondary | ICD-10-CM | POA: Diagnosis not present

## 2020-08-05 DIAGNOSIS — R1032 Left lower quadrant pain: Secondary | ICD-10-CM | POA: Diagnosis not present

## 2020-08-05 DIAGNOSIS — F32A Depression, unspecified: Secondary | ICD-10-CM

## 2020-08-05 DIAGNOSIS — F17210 Nicotine dependence, cigarettes, uncomplicated: Secondary | ICD-10-CM | POA: Diagnosis not present

## 2020-08-05 DIAGNOSIS — R1031 Right lower quadrant pain: Secondary | ICD-10-CM | POA: Diagnosis not present

## 2020-08-05 DIAGNOSIS — R531 Weakness: Secondary | ICD-10-CM | POA: Diagnosis present

## 2020-08-05 DIAGNOSIS — F329 Major depressive disorder, single episode, unspecified: Secondary | ICD-10-CM | POA: Diagnosis not present

## 2020-08-05 DIAGNOSIS — E860 Dehydration: Secondary | ICD-10-CM | POA: Diagnosis not present

## 2020-08-05 LAB — CBC WITH DIFFERENTIAL/PLATELET
Abs Immature Granulocytes: 0.01 10*3/uL (ref 0.00–0.07)
Basophils Absolute: 0 10*3/uL (ref 0.0–0.1)
Basophils Relative: 1 %
Eosinophils Absolute: 0.2 10*3/uL (ref 0.0–0.5)
Eosinophils Relative: 3 %
HCT: 40.2 % (ref 36.0–46.0)
Hemoglobin: 13.6 g/dL (ref 12.0–15.0)
Immature Granulocytes: 0 %
Lymphocytes Relative: 34 %
Lymphs Abs: 1.8 10*3/uL (ref 0.7–4.0)
MCH: 29.2 pg (ref 26.0–34.0)
MCHC: 33.8 g/dL (ref 30.0–36.0)
MCV: 86.5 fL (ref 80.0–100.0)
Monocytes Absolute: 0.5 10*3/uL (ref 0.1–1.0)
Monocytes Relative: 10 %
Neutro Abs: 2.9 10*3/uL (ref 1.7–7.7)
Neutrophils Relative %: 52 %
Platelets: 307 10*3/uL (ref 150–400)
RBC: 4.65 MIL/uL (ref 3.87–5.11)
RDW: 12.7 % (ref 11.5–15.5)
WBC: 5.5 10*3/uL (ref 4.0–10.5)
nRBC: 0 % (ref 0.0–0.2)

## 2020-08-05 LAB — COMPREHENSIVE METABOLIC PANEL
ALT: 26 U/L (ref 0–44)
AST: 20 U/L (ref 15–41)
Albumin: 4.5 g/dL (ref 3.5–5.0)
Alkaline Phosphatase: 49 U/L (ref 38–126)
Anion gap: 7 (ref 5–15)
BUN: 11 mg/dL (ref 6–20)
CO2: 31 mmol/L (ref 22–32)
Calcium: 10 mg/dL (ref 8.9–10.3)
Chloride: 101 mmol/L (ref 98–111)
Creatinine, Ser: 0.87 mg/dL (ref 0.44–1.00)
GFR, Estimated: >60 mL/min (ref >60)
Glucose, Bld: 116 mg/dL — ABNORMAL HIGH (ref 70–99)
Potassium: 3.2 mmol/L — ABNORMAL LOW (ref 3.5–5.1)
Sodium: 139 mmol/L (ref 135–145)
Total Bilirubin: 0.8 mg/dL (ref 0.3–1.2)
Total Protein: 7.6 g/dL (ref 6.5–8.1)

## 2020-08-05 LAB — I-STAT BETA HCG BLOOD, ED (MC, WL, AP ONLY): I-stat hCG, quantitative: <5 m[IU]/mL (ref <5)

## 2020-08-05 NOTE — Telephone Encounter (Signed)
Provider spoke to patient's mother to inform her that she would need to be medically cleared before she was admitted.  Provider noted that once in the ED she should address her concerns so that a psych evaluation can be determined.  She notes that she has tried this in the past however we will try again.  Provider spoke to [facility] providers as well as Systems analyst to collaborate proper protocol.  Provider was told that patient should be sent to the hospital for medical clearance.

## 2020-08-05 NOTE — Telephone Encounter (Signed)
Dr Ronne Binning instructed me to call mom back to direct her to call 911 to take her to the ED to get her medically stable and providing the ED staff with her information re her mental health hx and her current status a psych eval will be done and it is our hope she would be admitted to a psych unit once stable enough to be transferred. Mom states she has done this before and feels she needs fluids a feeding tube and PT. Discussed her being stable medically before she could go to [facility] or a psych unit and the effect it has on decision making to have her present with the same thing and what was done didn't work the first time so maybe changes can be made this time to improve outcomes like admission. Mom said she would follow thru with recommendation.

## 2020-08-05 NOTE — Telephone Encounter (Signed)
Call from patients mom, Carmen Daniels. She is concerned with her daughter who has stopped her psych meds several months ago and has also stopped eating and drinking. For this reason she fell in the shower several weeks ago and per mom as gone in to [facility]. She is staying with her mom now but continues to not eat or drink or take her meds. She will not come in to see Dr Ronne Binning because she doesn't want to take meds. Spoke with Dr Ronne Binning re concern and she spoke with patients mom. Unsure what the determination was re this, as Probation officer not present for the call.

## 2020-08-05 NOTE — ED Provider Notes (Signed)
Emergency Medicine Provider Triage Evaluation Note  Carmen Daniels , a 35 y.o. female  was evaluated in triage.  Pt complains of lower abdominal pain with 1 black stool today, denies NSAID use.  Reports admitted to [name] in May after a fall resulting in rhabdo, has been unable to walk since then due to ongoing pain and weakness in her legs.  Patient has been in pain since that time and has lost appetite and is not drinking fluids.  Patient has been back to [name] once for IV fluids and to PCP for IV fluids.  Mother is requesting behavioral health evaluation, patient states that she is not suicidal she is just in pain and therefore not eating or drinking..  Review of Systems  Positive: Abdominal pain, black stool, loss of appetite Negative: Fever  Physical Exam  BP (!) 144/93   Pulse (!) 111   Temp 98.3 F (36.8 C) (Oral)   Resp 16   LMP  (LMP Unknown)   SpO2 99%  Gen:   Awake, no distress   Resp:  Normal effort  MSK:   Moves upper extremities without difficulty  Other:  No abdominal tenderness   Medical Decision Making  Medically screening exam initiated at 7:43 PM.  Appropriate orders placed.  Carmen Daniels was informed that the remainder of the evaluation will be completed by another provider, this initial triage assessment does not replace that evaluation, and the importance of remaining in the ED until their evaluation is complete.     Tacy Learn, PA-C 08/05/20 1945    [name], MD 08/06/20 0001

## 2020-08-05 NOTE — ED Triage Notes (Signed)
Pt presents from home, states she had a fall on 5/6 and has been having increased weakness since with difficulty ambulating. Pt states she has had poor oral intake due to pain and overall malaise. Denies fevers or additional falls. Reports dark stools

## 2020-08-06 ENCOUNTER — Emergency Department (HOSPITAL_COMMUNITY): Payer: Medicaid Other

## 2020-08-06 LAB — RESP PANEL BY RT-PCR (FLU A&B, COVID) ARPGX2
Influenza A by PCR: NEGATIVE
Influenza B by PCR: NEGATIVE
SARS Coronavirus 2 by RT PCR: NEGATIVE

## 2020-08-06 LAB — URINALYSIS, ROUTINE W REFLEX MICROSCOPIC
Bilirubin Urine: NEGATIVE
Glucose, UA: NEGATIVE mg/dL
Hgb urine dipstick: NEGATIVE
Ketones, ur: NEGATIVE mg/dL
Leukocytes,Ua: NEGATIVE
Nitrite: NEGATIVE
Protein, ur: NEGATIVE mg/dL
Specific Gravity, Urine: 1.014 (ref 1.005–1.030)
pH: 6 (ref 5.0–8.0)

## 2020-08-06 LAB — CK: Total CK: 18 U/L — ABNORMAL LOW (ref 38–234)

## 2020-08-06 MED ORDER — FENTANYL CITRATE (PF) 100 MCG/2ML IJ SOLN
50.0000 ug | Freq: Once | INTRAMUSCULAR | Status: AC
  Administered 2020-08-06: 50 ug via INTRAVENOUS
  Filled 2020-08-06: qty 2

## 2020-08-06 MED ORDER — ARIPIPRAZOLE 5 MG PO TABS
5.0000 mg | ORAL_TABLET | Freq: Every day | ORAL | Status: DC
  Administered 2020-08-06: 5 mg via ORAL
  Filled 2020-08-06: qty 1

## 2020-08-06 MED ORDER — CYCLOBENZAPRINE HCL 10 MG PO TABS
5.0000 mg | ORAL_TABLET | Freq: Three times a day (TID) | ORAL | Status: DC
  Administered 2020-08-06: 5 mg via ORAL
  Filled 2020-08-06: qty 1

## 2020-08-06 MED ORDER — GABAPENTIN 300 MG PO CAPS
300.0000 mg | ORAL_CAPSULE | Freq: Three times a day (TID) | ORAL | Status: DC
  Administered 2020-08-06: 300 mg via ORAL
  Filled 2020-08-06: qty 1

## 2020-08-06 MED ORDER — TRAZODONE HCL 100 MG PO TABS: 100.0000 mg | ORAL_TABLET | Freq: Every day | ORAL | Status: DC

## 2020-08-06 MED ORDER — TRAMADOL HCL 50 MG PO TABS: 50.0000 mg | ORAL_TABLET | Freq: Four times a day (QID) | ORAL | Status: DC | PRN

## 2020-08-06 MED ORDER — IOHEXOL 300 MG/ML  SOLN
100.0000 mL | Freq: Once | INTRAMUSCULAR | Status: AC | PRN
  Administered 2020-08-06: 100 mL via INTRAVENOUS

## 2020-08-06 MED ORDER — SODIUM CHLORIDE 0.9 % IV BOLUS
1000.0000 mL | Freq: Once | INTRAVENOUS | Status: AC
  Administered 2020-08-06: 1000 mL via INTRAVENOUS

## 2020-08-06 MED ORDER — DULOXETINE HCL 30 MG PO CPEP
30.0000 mg | ORAL_CAPSULE | Freq: Every day | ORAL | Status: DC
  Administered 2020-08-06: 30 mg via ORAL
  Filled 2020-08-06: qty 1

## 2020-08-06 MED ORDER — SODIUM CHLORIDE (PF) 0.9 % IJ SOLN
INTRAMUSCULAR | Status: AC
  Filled 2020-08-06: qty 50

## 2020-08-06 MED ORDER — CLONAZEPAM 0.5 MG PO TABS
1.0000 mg | ORAL_TABLET | Freq: Four times a day (QID) | ORAL | Status: DC
  Administered 2020-08-06: 1 mg via ORAL
  Filled 2020-08-06: qty 2

## 2020-08-06 MED ORDER — BECLOMETHASONE DIPROPIONATE 40 MCG/ACT IN AERS: 1.0000 | INHALATION_SPRAY | Freq: Two times a day (BID) | RESPIRATORY_TRACT | Status: DC | PRN

## 2020-08-06 MED ORDER — ACETAMINOPHEN 325 MG PO TABS
650.0000 mg | ORAL_TABLET | Freq: Once | ORAL | Status: AC
  Administered 2020-08-06: 650 mg via ORAL
  Filled 2020-08-06: qty 2

## 2020-08-06 NOTE — Discharge Instructions (Addendum)
Follow-up with your psychiatrist June 30 and follow-up with partial hospital program by calling [phone]

## 2020-08-06 NOTE — ED Notes (Signed)
TTS machine is at the bedside for [facility] consult

## 2020-08-06 NOTE — ED Notes (Signed)
Patient is quiet, appears to be resting.  Alert and cooperative.

## 2020-08-06 NOTE — Telephone Encounter (Signed)
Opened an additional time in error.

## 2020-08-06 NOTE — BH Assessment (Signed)
Comprehensive Clinical Assessment (CCA) Note  08/06/2020 Ladona Horns [id number]  DISPOSITION:  Gave clinical report to Eduard Clos, MD, who determined that Pt does not meet inpatient criteria.  It is recommended she follow-up with PHP.  The patient demonstrates the following risk factors for suicide: Chronic risk factors for suicide include: psychiatric disorder of Bipolar Disorder and two prior attempts . Acute risk factors for suicide include:  chronic pain . Protective factors for this patient include: positive social support, positive therapeutic relationship, and responsibility to others (children, family). Considering these factors, the overall suicide risk at this point appears to be low. Patient is appropriate for outpatient follow up.   [place] ED from 08/05/2020 in [place] DEPT ED from 07/23/2020 in [place] ED to Hosp-Admission (Discharged) from 06/29/2020 in [place] No Risk No Risk No Risk       Chief Complaint:  Chief Complaint  Patient presents with   Weakness   Depression    ''Down'' due to chronic pain resulting from fall in May 2022   Visit Diagnosis: Bipolar I   NARRATIVE:  Pt is a 35 year old female who presented to [facility] on a voluntary basis with complaint of poor appetite/dehydration due to chronic pain arising from a fall accident that occurred in May 2022.  Pt lives in [place] with her mother, and she is on disability due to Bipolar I Disorder.  Pt receives outpatient psychiatric treatment through [facility].  Pt stated that she is not currently compliant with psych meds as she wants to began ECT.    Pt's mother requested the TTS consult as mother is concerned that Pt is not eating or drinking (Pt was hydrated while at hospital).  Pt acknowledged that she has poor appetite and that she has lost about 40 pounds over the last three months due to chronic pain  and other concerns.  Pt stated that she feels ''down'' due to chronic pain arising from a fall she suffered in May 2022.  Pt endorsed despondency and poor appetite.  She denied suicidal ideation, homicidal ideation, substance use concerns, hallucination, and self-injurious behavior.  Pt's next psychiatric appointment is June 30th.  During assessment, Pt presented as alert and oriented.  She had good eye contact and was cooperative. Pt was gowned and appropriately groomed.  Pt's mood was sad, and affect was blunted.  Pt's speech was normal in rate, rhythm, and volume.  Thought processes were within normal range, and thought content was logical and goal-oriented.  There was no evidence of delusion.  Memory and concentration were fair.  Insight, judgment, and impulse control were good.  CCA Screening, Triage and Referral (STR)  Patient Reported Information How did you hear about [place]? Other (Comment) (Pt at [facility] due to medical issue -- pain, reduced appetite)  Referral name: Zacarias Pontes  Referral phone number: No data recorded  Whom do you see for routine medical problems? Primary Care  Practice/Facility Name: [facility])  Practice/Facility Phone Number: No data recorded Name of Contact: -- (Dr Nancy Fetter)  Contact Number: No data recorded Contact Fax Number: No data recorded Prescriber Name: -- (Dr Nancy Fetter)  Prescriber Address (if known): No data recorded  What Is the Reason for Your Visit/Call Today? SI attempt (Self Inflicted Wound/ SI attempt buy cutting)  How Long Has This Been Causing You Problems? 1 wk - 1 month  What Do You Feel Would Help  You the Most Today? Treatment for Depression or other mood problem; Medication(s)   Have You Recently Been in Any Inpatient Treatment (Hospital/Detox/Crisis Center/28-Day Program)? No  Name/Location of Program/Hospital:No data recorded How Long Were You There? No data recorded When Were You Discharged? No data recorded  Have You  Ever Received Services From [facility] Before? Yes  Who Do You See at [facility]? -- (Urgent Carev Visit)   Have You Recently Had Any Thoughts About Hurting Yourself? No  Are You Planning to Commit Suicide/Harm Yourself At This time? No   Have you Recently Had Thoughts About [place]? No  Explanation: No data recorded  Have You Used Any Alcohol or Drugs in the Past 24 Hours? No  How Long Ago Did You Use Drugs or Alcohol? No data recorded What Did You Use and How Much? No data recorded  Do You Currently Have a Therapist/Psychiatrist? Yes  Name of Therapist/Psychiatrist: Day Recently Discharged From Any Office Practice or Programs? No  Explanation of Discharge From Practice/Program: No data recorded    CCA Screening Triage Referral Assessment Type of Contact: Tele-Assessment  Is this Initial or Reassessment? Initial Assessment  Date Telepsych consult ordered in [facility]:  08/06/20  Time Telepsych consult ordered in [facility]:  No data recorded  Patient Reported Information Reviewed? Yes  Patient Left Without Being Seen? No data recorded Reason for Not Completing Assessment: No data recorded  Collateral Involvement: Pt's mother   Does Patient Have a Court Appointed Legal Guardian? No data recorded Name and Contact of Legal Guardian: No data recorded If Minor and Not Living with Parent(s), Who has Custody? -- (NA)  Is CPS involved or ever been involved? Never  Is APS involved or ever been involved? Never   Patient Determined To Be At Risk for Harm To Self or Others Based on Review of Patient Reported Information or Presenting Complaint? No  Method: No data recorded Availability of Means: No data recorded Intent: No data recorded Notification Required: No data recorded Additional Information for Danger to Others Potential: No data recorded Additional Comments for Danger to Others Potential: No data recorded Are There Guns or  Other Weapons in Your Home? No data recorded Types of Guns/Weapons: No data recorded Are These Weapons Safely Secured?                            No data recorded Who Could Verify You Are Able To Have These Secured: No data recorded Do You Have any Outstanding Charges, Pending Court Dates, Parole/Probation? No data recorded Contacted To Inform of Risk of Harm To Self or Others: Other: Comment   Location of Assessment: [facility] ED   Does Patient Present under Involuntary Commitment? No  IVC Papers Initial File Date: No data recorded  [place] of Residence: [place]   Patient Currently Receiving the Following Services: Medication Management; Individual Therapy   Determination of Need: Urgent (48 hours)   Options For Referral: Outpatient Therapy; Medication Management     CCA Biopsychosocial Intake/Chief Complaint:  -- (self inflicted wound)  Current Symptoms/Problems: No data recorded  Patient Reported Schizophrenia/Schizoaffective Diagnosis in Past: No   Strengths: Good insight, supportive family  Preferences: No data recorded Abilities: No data recorded  Type of Services Patient Feels are Needed: No data recorded  Initial Clinical Notes/Concerns: No data recorded  Mental Health Symptoms Depression:   Change in energy/activity; Fatigue; Increase/decrease in appetite; Weight gain/loss  Duration of Depressive symptoms:  Greater than two weeks   Mania:   None   Anxiety:    None   Psychosis:   None   Duration of Psychotic symptoms: No data recorded  Trauma:   None   Obsessions:   None   Compulsions:   None   Inattention:   None   Hyperactivity/Impulsivity:   None   Oppositional/Defiant Behaviors:   None   Emotional Irregularity:   None   Other Mood/Personality Symptoms:   ''Feeling down'' due to chronic pain; Pt currently not taking medication as she is trying to get into ECT; reduced appetite    Mental Status Exam Appearance and self-care   Stature:   Average   Weight:   Average weight   Clothing:   Casual   Grooming:   Normal   Cosmetic use:   None   Posture/gait:   Normal   Motor activity:   Not Remarkable   Sensorium  Attention:   Normal   Concentration:   Normal   Orientation:   X5   Recall/memory:   Normal   Affect and Mood  Affect:   Blunted   Mood:   Depressed   Relating  Eye contact:   Normal   Facial expression:   Responsive   Attitude toward examiner:   Cooperative   Thought and Language  Speech flow:  Clear and Coherent   Thought content:   Appropriate to Mood and Circumstances   Preoccupation:   None   Hallucinations:   None   Organization:  No data recorded  [employer] of Knowledge:   Good   Intelligence:   Average   Abstraction:   Normal   Judgement:   Fair   Art therapist:   Realistic   Insight:   Fair   Decision Making:   Normal   Social Functioning  Social Maturity:   Isolates   Social Judgement:   Normal   Stress  Stressors:   Other (Comment) (Accident in May; renal failure; pain)   Coping Ability:   Exhausted   Skill Deficits:   Activities of daily living   Supports:   Family     Religion: Religion/Spirituality Are You A Religious Person?: No  Leisure/Recreation: Leisure / Recreation Do You Have Hobbies?: Yes  Exercise/Diet: Exercise/Diet Do You Exercise?: Yes What Type of Exercise Do You Do?: Other (Comment) (Cannot exercise currently due to chronic pain) Have You Gained or Lost A Significant Amount of Weight in the Past Six Months?: Yes-Lost Number of Pounds Lost?: 40 (over 3 months) Do You Follow a Special Diet?: Yes Do You Have Any Trouble Sleeping?: No   CCA Employment/Education Employment/Work Situation: Employment / Work Technical sales engineer: On disability Why is Patient on Disability: Bipolar I Patient's Job has Been Impacted by Current Illness: No Has Patient ever  Been in the [employer]?: No  Education: Education Is Patient Currently Attending School?: No Last Grade Completed: 12 Did You Attend College?: Yes Did You Have An Individualized Education Program (IIEP): No Did You Have Any Difficulty At School?: No Patient's Education Has Been Impacted by Current Illness: No   CCA Family/Childhood History Family and Relationship History: Family history Marital status: Divorced Does patient have children?: No  Childhood History:  Childhood History Did patient suffer any verbal/emotional/physical/sexual abuse as a child?: No Did patient suffer from severe childhood neglect?: No Has patient ever been sexually abused/assaulted/raped as an adolescent or adult?: No Witnessed domestic violence?: No Has  patient been affected by domestic violence as an adult?: No  Child/Adolescent Assessment:     CCA Substance Use Alcohol/Drug Use: Alcohol / Drug Use Pain Medications: see MAR Prescriptions: see MAR Over the Counter: see MAR History of alcohol / drug use?: No history of alcohol / drug abuse                         ASAM's:  Six Dimensions of Multidimensional Assessment  Dimension 1:  Acute Intoxication and/or Withdrawal Potential:      Dimension 2:  Biomedical Conditions and Complications:      Dimension 3:  Emotional, Behavioral, or Cognitive Conditions and Complications:     Dimension 4:  Readiness to Change:     Dimension 5:  Relapse, Continued use, or Continued Problem Potential:     Dimension 6:  Recovery/Living Environment:     ASAM Severity Score:    ASAM Recommended Level of Treatment:     Substance use Disorder (SUD)    Recommendations for Services/Supports/Treatments:    DSM5 Diagnoses: Patient Active Problem List   Diagnosis Date Noted   Malnutrition of moderate degree 07/01/2020   Acute renal failure (HCC) 06/30/2020   Non-traumatic rhabdomyolysis    Elevated LFTs    Tardive dyskinesia 11/14/2019   Bipolar  I disorder, most recent episode depressed ([place]) 09/13/2019   Social anxiety disorder 09/13/2019   Adjustment disorder with mixed anxiety and depressed mood 07/31/2019   Suicidal ideation    Post-operative state 07/19/2019   Seizures ([place]) 06/08/2019   Bipolar 1 disorder, depressed ([place]) 05/17/2019   Status epilepticus ([place]) 05/17/2019   Intentional benzodiazepine overdose ([place]) 03/29/2019   Intractable chronic 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 status migrainosus 02/08/2019   History of elective abortion 12/28/2018   QT prolongation 07/03/2018   AMS (altered mental status) 07/03/2018   Serum total bilirubin elevated 07/03/2018   Syncope 07/02/2018   Borderline personality disorder ([place]) 04/11/2017   Bipolar I disorder, current or most recent episode manic, with psychotic features ([place]) 04/10/2017   Bipolar disorder ([place]) 04/10/2017   Abnormal MRI of head 04/21/2016   Chronic migraine without aura 03/27/2015   Mild persistent asthma 12/30/2014   Allergic rhinitis due to pollen 12/30/2014   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

## 2020-08-06 NOTE — ED Provider Notes (Signed)
[facility] DEPT Provider Note   CSN: [id number] Arrival date & time: 08/05/20  1837     History Chief Complaint  Patient presents with   Weakness    Carmen Daniels is a 35 y.o. female.  Presents to the emergency department for evaluation of generalized weakness.  Patient accompanied by her mother.  Mother does most of the speaking as patient is somewhat aloof.  Patient and mother believe that she is dehydrated.  Patient has not been eating or drinking.  She has a history of severe dehydration from poor oral intake.  She was hospitalized last month for this.  Patient reports that she is in severe pain and this keeps her from eating or drinking.  She has a history of chronic pain syndrome and generalized fibromyalgia.  She had a fall last month and has had increased leg pain since the fall.  Patient has developed lower abdominal pain today which is a new process for her.  No vomiting or diarrhea.      Past Medical History:  Diagnosis Date   Acute respiratory failure with hypoxia (HCC)    Acute rhinosinusitis 07/03/2018   Allergic rhinitis    Anxiety    Asthma    Back pain    Bipolar disorder (HCC)    Borderline personality disorder (HCC)    Bupropion overdose    Chronic pain    Chronic pain syndrome    Depression    Fibromyalgia    generalized   Hallucinations    Migraines    Nasal polyps    Nausea    Neck pain    OCD (obsessive compulsive disorder)    OCD (obsessive compulsive disorder)    Ovarian cyst    left   Pneumonia    2021   PONV (postoperative nausea and vomiting)    Schizophrenia ([place])    schizoaffective    Patient Active Problem List   Diagnosis Date Noted   Malnutrition of moderate degree 07/01/2020   Acute renal failure ([place]) 06/30/2020   Non-traumatic rhabdomyolysis    Elevated LFTs    Tardive dyskinesia 11/14/2019   Bipolar I disorder, most recent episode depressed ([place]) 09/13/2019   Social anxiety disorder  09/13/2019   Adjustment disorder with mixed anxiety and depressed mood 07/31/2019   Suicidal ideation    Post-operative state 07/19/2019   Seizures ([place]) 06/08/2019   Bipolar 1 disorder ([place]) 05/17/2019   Status epilepticus ([place]) 05/17/2019   Intentional benzodiazepine overdose ([place]) 03/29/2019   Intractable chronic 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 status migrainosus 02/08/2019   History of elective abortion 12/28/2018   QT prolongation 07/03/2018   AMS (altered mental status) 07/03/2018   Serum total bilirubin elevated 07/03/2018   Syncope 07/02/2018   Borderline personality disorder ([place]) 04/11/2017   Bipolar I disorder, current or most recent episode manic, with psychotic features ([place]) 04/10/2017   Bipolar disorder ([place]) 04/10/2017   Abnormal MRI of head 04/21/2016   Chronic migraine without aura 03/27/2015   Mild persistent asthma 12/30/2014   Allergic rhinitis due to pollen 12/30/2014   Rhinitis medicamentosa 12/30/2014   Nasal polyposis 12/30/2014    Past Surgical History:  Procedure Laterality Date   ABDOMINAL HYSTERECTOMY     LAPAROSCOPIC TUBAL LIGATION Bilateral 06/13/2019   Procedure: LAPAROSCOPIC TUBAL LIGATION;  Surgeon: Chancy Milroy, MD;  Location: [place];  Service: Gynecology;  Laterality: Bilateral;  FILSHIE CLIPS   NASAL SINUS SURGERY     TUBAL LIGATION  tubes in ears     VAGINAL HYSTERECTOMY Bilateral 01/15/2020   Procedure: HYSTERECTOMY VAGINAL;  Surgeon: Chancy Milroy, MD;  Location: [facility];  Service: Gynecology;  Laterality: Bilateral;   WISDOM TOOTH EXTRACTION       OB History   No obstetric history on file.     Family History  Problem Relation Age of Onset   Healthy Father    Healthy Mother    Arthritis Maternal Grandmother    Arthritis Maternal Grandfather    Depression Paternal Grandmother     Social History   Tobacco Use   Smoking status: Every Day    Packs/day: 0.50    Years: 15.00    Pack years: 7.50     Types: Cigarettes   Smokeless tobacco: Never  Vaping Use   Vaping Use: Every day   Substances: Nicotine  Substance Use Topics   Alcohol use: Never   Drug use: No    Home Medications Prior to Admission medications   Medication Sig Start Date End Date Taking? Authorizing Provider  ARIPiprazole (ABILIFY) 5 MG tablet TAKE 1 TABLET (5 MG TOTAL) BY MOUTH DAILY. 06/19/20   Salley Slaughter, NP  AUSTEDO 9 MG TABS TAKE 2 TABLETS BY MOUTH TWICE A DAY Patient not taking: Reported on 06/30/2020 04/14/20   Salley Slaughter, NP  beclomethasone (QVAR) 40 MCG/ACT inhaler Inhale 1 puff into the lungs 2 (two) times daily as needed (asthma).     [provider]  botulinum toxin Type A (BOTOX) 100 units SOLR injection Inject 200 Units into the muscle every 3 (three) months. Inject into head and neck muscles 04/30/19   Marcial Pacas, MD  clonazePAM (KLONOPIN) 1 MG tablet Take 1 mg by mouth 4 (four) times daily. 07/14/19   [provider]  cyclobenzaprine (FLEXERIL) 5 MG tablet Take 1 tablet (5 mg total) by mouth 3 (three) times daily. 07/11/20 08/10/20  [name], MD  DULoxetine (CYMBALTA) 30 MG capsule Take 1 capsule (30 mg total) by mouth daily. 07/11/20   [name], MD  Fremanezumab-vfrm (AJOVY) 225 MG/1.5ML SOAJ Inject 225 mg into the skin every 30 (thirty) days. 12/12/19   Marcial Pacas, MD  gabapentin (NEURONTIN) 300 MG capsule Take 1 capsule (300 mg total) by mouth 3 (three) times daily as needed. 07/24/20 08/23/20  Lucrezia Starch, MD  Multiple Vitamins-Calcium (ONE-A-DAY WOMENS FORMULA PO) Take 1 tablet by mouth daily.    [provider]  SUMAtriptan (IMITREX) 100 MG tablet Take 1 tablet (100 mg total) by mouth every 2 (two) hours as needed for migraine. MAY DOSE 2 PER DAY OR 3 12/12/19   Marcial Pacas, MD  traMADol (ULTRAM) 50 MG tablet Take 50 mg by mouth 4 (four) times daily as needed for pain. 02/28/20   [provider]  traZODone (DESYREL) 100 MG tablet Take 1  tablet (100 mg total) by mouth at bedtime. 07/11/20   [name], MD    Allergies    Keflex [cephalexin]  Review of Systems   Review of Systems  Constitutional:  Positive for activity change, appetite change and fatigue.  Gastrointestinal:  Positive for abdominal pain.  Musculoskeletal:  Positive for arthralgias.  Psychiatric/Behavioral:  Positive for dysphoric mood.   All other systems reviewed and are negative.  Physical Exam Updated Vital Signs BP 99/61   Pulse 86   Temp 98.3 F (36.8 C) (Oral)   Resp 14   LMP  (LMP Unknown)   SpO2 99%   Physical Exam  Vitals and nursing note reviewed.  Constitutional:      General: She is not in acute distress.    Appearance: Normal appearance. She is well-developed.  HENT:     Head: Normocephalic and atraumatic.     Right Ear: Hearing normal.     Left Ear: Hearing normal.     Nose: Nose normal.  Eyes:     Conjunctiva/sclera: Conjunctivae normal.     Pupils: Pupils are equal, round, and reactive to light.  Cardiovascular:     Rate and Rhythm: Regular rhythm.     Heart sounds: S1 normal and S2 normal. No murmur heard.   No friction rub. No gallop.  Pulmonary:     Effort: Pulmonary effort is normal. No respiratory distress.     Breath sounds: Normal breath sounds.  Chest:     Chest wall: No tenderness.  Abdominal:     General: Bowel sounds are normal.     Palpations: Abdomen is soft.     Tenderness: There is abdominal tenderness in the right lower quadrant, suprapubic area and left lower quadrant. There is no guarding or rebound. Negative signs include Murphy's sign and McBurney's sign.     Hernia: No hernia is present.  Musculoskeletal:        General: Normal range of motion.     Cervical back: Normal range of motion and neck supple.  Skin:    General: Skin is warm and dry.     Findings: No rash.  Neurological:     Mental Status: She is alert and oriented to person, place, and time.     GCS: GCS eye subscore is 4.  GCS verbal subscore is 5. GCS motor subscore is 6.     Cranial Nerves: No cranial nerve deficit.     Sensory: No sensory deficit.     Coordination: Coordination normal.  Psychiatric:        Speech: Speech normal.        Behavior: Behavior normal.        Thought Content: Thought content normal.    ED Results / Procedures / Treatments   Labs (all labs ordered are listed, but only abnormal results are displayed) Labs Reviewed  COMPREHENSIVE METABOLIC PANEL - Abnormal; Notable for the following components:      Result Value   Potassium 3.2 (*)    Glucose, Bld 116 (*)    All other components within normal limits  CK - Abnormal; Notable for the following components:   Total CK 18 (*)    All other components within normal limits  CBC WITH DIFFERENTIAL/PLATELET  URINALYSIS, ROUTINE W REFLEX MICROSCOPIC  I-STAT BETA HCG BLOOD, ED ([facility], [facility], [facility] ONLY)    EKG None  Radiology CT ABDOMEN PELVIS W CONTRAST  Result Date: 08/06/2020 CLINICAL DATA:  35 year old female with lower abdominal pain, weakness and difficulty walking after a fall last month. EXAM: CT ABDOMEN AND PELVIS WITH CONTRAST TECHNIQUE: Multidetector CT imaging of the abdomen and pelvis was performed using the standard protocol following bolus administration of intravenous contrast. CONTRAST:  162mL OMNIPAQUE IOHEXOL 300 MG/ML  SOLN COMPARISON:  CT Abdomen and Pelvis 01/20/2020 and earlier. FINDINGS: Lower chest: Negative. Hepatobiliary: Larger gallbladder today, but no gallbladder inflammation is evident. No gallstones are evident. Liver appears negative. No bile duct enlargement. Pancreas: Negative. Spleen: Negative. Adrenals/Urinary Tract: Negative adrenal glands. Symmetric renal enhancement and early contrast excretion from both kidneys. No hydronephrosis or hydroureter. Diminutive bladder with stable pelvic phleboliths. Stomach/Bowel: Redundant sigmoid colon with  mild retained stool. Mild retained stool also in the transverse colon.  Negative right colon and appendix (series 2, image 65). Negative terminal ileum with no dilated small bowel. Stomach and duodenum appear negative. No free air, free fluid, mesenteric inflammation. Vascular/Lymphatic: Major arterial structures in the abdomen and pelvis appear patent and within normal limits. Portal venous system is patent. No lymphadenopathy. Reproductive: Surgically absent uterus. Normal left ovary. Right ovary is diminutive or absent. Other: No pelvic free fluid. Musculoskeletal: No acute osseous abnormality identified. Mild lumbar facet hypertrophy. IMPRESSION: No acute or inflammatory process identified in the abdomen or pelvis. Normal appendix.  Surgically absent uterus. Electronically Signed   By: Genevie Ann M.D.   On: 08/06/2020 05:56    Procedures Procedures   Medications Ordered in ED Medications  sodium chloride (PF) 0.9 % injection (has no administration in time range)  sodium chloride 0.9 % bolus 1,000 mL (0 mLs Intravenous Stopped 08/06/20 0147)  fentaNYL (SUBLIMAZE) injection 50 mcg (50 mcg Intravenous Given 08/06/20 0145)  sodium chloride 0.9 % bolus 1,000 mL (0 mLs Intravenous Stopped 08/06/20 0452)  fentaNYL (SUBLIMAZE) injection 50 mcg (50 mcg Intravenous Given 08/06/20 0434)  iohexol (OMNIPAQUE) 300 MG/ML solution 100 mL (100 mLs Intravenous Contrast Given 08/06/20 0456)    ED Course  I have reviewed the triage vital signs and the nursing notes.  Pertinent labs & imaging results that were available during my care of the patient were reviewed by me and considered in my medical decision making (see chart for details).    MDM Rules/Calculators/A&P                          Patient brought to the emergency department by her mother.  Mother is concerned because the patient is not eating or drinking.  Mother did give fair amount of additional information.  Patient has been severely depressed.  Patient has a previous diagnosis of bipolar disorder.  Patient stopped taking  her bipolar medications several months ago because of weight gain.  She then became fixated on her weight and has not been eating or drinking in an effort to lose weight.  This was so severe that she was recently hospitalized for an acute kidney injury and rhabdomyolysis secondary to restricting her oral intake.  Mother feels that if the patient is discharged she will go back into her room and refused to get out of bed until she is dehydrated again.  Patient will require a psychiatric evaluation.  Her work-up has been very reassuring, she is medically clear.  Final Clinical Impression(s) / ED Diagnoses Final diagnoses:  Dehydration  Depression, unspecified depression type    Rx / DC Orders ED Discharge Orders     None        [name], MD 08/06/20 [phone]

## 2020-08-07 ENCOUNTER — Telehealth (HOSPITAL_COMMUNITY): Payer: Self-pay | Admitting: *Deleted

## 2020-08-07 ENCOUNTER — Other Ambulatory Visit (HOSPITAL_COMMUNITY): Payer: Self-pay | Admitting: Psychiatry

## 2020-08-07 MED ORDER — DULOXETINE HCL 30 MG PO CPEP: 30.0000 mg | ORAL_CAPSULE | Freq: Every day | ORAL | 0 refills | Status: DC

## 2020-08-07 NOTE — Telephone Encounter (Signed)
Thank you :)

## 2020-08-07 NOTE — Telephone Encounter (Signed)
Mom called to see if Dr Ronne Binning would write for her Cymbalta and see her sooner than her 30 th appt. Dr Ronne Binning notified and will call her med in and speak with her in a virtaul appt on Monday the 20th at 63

## 2020-08-07 NOTE — Telephone Encounter (Signed)
Unfortunately provider does not have any scheduled appointment time prior to 08/21/2020.  Patient can walk in during providers walking hours on Monday and Tuesday from 8 AM through 10 AM.

## 2020-08-07 NOTE — Telephone Encounter (Signed)
VM from patients mom stating after being overnight at the hospital she was released with recommendation for her to take Cymbalta 30 mg 1 qd. Also, asking if she can be seen prior to 6/30. Will bring these requests to Dr Ronne Binning attention.

## 2020-08-11 ENCOUNTER — Telehealth (INDEPENDENT_AMBULATORY_CARE_PROVIDER_SITE_OTHER): Payer: Medicaid Other | Admitting: Psychiatry

## 2020-08-11 ENCOUNTER — Other Ambulatory Visit: Payer: Self-pay

## 2020-08-11 ENCOUNTER — Encounter (HOSPITAL_COMMUNITY): Payer: Self-pay | Admitting: Psychiatry

## 2020-08-11 DIAGNOSIS — F319 Bipolar disorder, unspecified: Secondary | ICD-10-CM | POA: Diagnosis not present

## 2020-08-11 DIAGNOSIS — F4522 Body dysmorphic disorder: Secondary | ICD-10-CM

## 2020-08-11 MED ORDER — DULOXETINE HCL 30 MG PO CPEP: 30.0000 mg | ORAL_CAPSULE | Freq: Every day | ORAL | 2 refills | Status: DC

## 2020-08-11 NOTE — Progress Notes (Signed)
[facility] MD/PA/NP OP Progress Note Virtual Visit via Telephone Note  I connected with Carmen Daniels on 08/11/20 at  4:30 PM EDT by telephone and verified that I am speaking with the correct person using two identifiers.  Location: Patient: home Provider: Clinic   I discussed the limitations, risks, security and privacy concerns of performing an evaluation and management service by telephone and the availability of in person appointments. I also discussed with the patient that there may be a patient responsible charge related to this service. The patient expressed understanding and agreed to proceed.   I provided 30 minutes of non-face-to-face time during this encounter.  08/11/2020 5:30 PM Carmen Daniels  MRN:  [id number]  Chief Complaint: "I am still interested in ECT"    HPI: 35 year-old female seen today for follow-up psychiatric evaluation. She has a psychiatric history of bipolar disorder, borderline personality disorder, anxiety, depression, and SI/SA. She is currently managed on Cymbalta 30 mg daily.  She notes medications are somewhat effective in managing her psychiatric conditions.  Today she was unable to login virtually so her assessment was done over the phone.  During exam she was pleasant, cooperative, and engaged in conversation.  Her voice tone was decreased and her speech was slow.  She informed provider that she is still depressed and anxious most days however notes that she does not want take more medications or adjust her dose.  She informed Probation officer that she continues to be interested in ECT.  Provider informed patient that writer had spoken to Dr. Weber Cooks at [facility] about potentially administering ECT.  She endorsed understanding and notes that this is something that she is interested in.  Provider informed patient that she will be contacted regarding this procedure.  She endorsed understanding.   Today provider conducted a GAD-7 and patient scored a 13, at her last visit she  scored 18.  Provider also conducted a PHQ-9 and patient scored 15, at her last visit she scored a 19 today she denies SI/HI/VAH.  Patient notes that she continues to have a poor appetite however notes that she has managed to start eating 2 meals daily.  She notes that she is not as weak as she once was however notes that she still unable to walk.  Patient was seen with her mother who notes that she has been eating more and will be following up with her physical therapist on Thursday.  She also notes that she will be following up with her neurologist next week for assessment of her pain.  Patient notes that she has pain in her legs and feet.  She reports that gabapentin and oxycodone are somewhat effective in managing this pain.  Patient notes that she has body dysmorphia.  Does endorse problems patient with her physical appearance.  She notes that she perceives deficits in her body that she does not have.  His condition is causing distress and her social and occupational functioning.  Patient informed provider that she stopped eating because she feared that she would gain weight.  She has been to the hospital on several occasions for dehydration and rhabdo.  Per patient's mother she has lost over 40-50 pounds.  Today she notes that she does not want her medications adjusted.  She will continue Cymbalta as prescribed.  Patient notes that she will be awaiting a call from [facility] regarding her ECT treatments.  She will follow-up with provider in 2 months for further evaluation.  She will also follow-up with outpatient  counseling for therapy.  No other concerns noted at this time.  Visit Diagnosis:    ICD-10-CM   1. Bipolar 1 disorder, depressed (HCC)  F31.9 DULoxetine (CYMBALTA) 30 MG capsule    2. 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        Past Psychiatric History: bipolar disorder, borderline personality disorder, anxiety, depression, and SI/SA Past Medical History:  Past Medical History:  Diagnosis Date    Acute respiratory failure with hypoxia (HCC)    Acute rhinosinusitis 07/03/2018   Allergic rhinitis    Anxiety    Asthma    Back pain    Bipolar disorder (HCC)    Borderline personality disorder (HCC)    Bupropion overdose    Chronic pain    Chronic pain syndrome    Depression    Fibromyalgia    generalized   Hallucinations    Migraines    Nasal polyps    Nausea    Neck pain    OCD (obsessive compulsive disorder)    OCD (obsessive compulsive disorder)    Ovarian cyst    left   Pneumonia    2021   PONV (postoperative nausea and vomiting)    Schizophrenia ([place])    schizoaffective    Past Surgical History:  Procedure Laterality Date   ABDOMINAL HYSTERECTOMY     LAPAROSCOPIC TUBAL LIGATION Bilateral 06/13/2019   Procedure: LAPAROSCOPIC TUBAL LIGATION;  Surgeon: Chancy Milroy, MD;  Location: [facility];  Service: Gynecology;  Laterality: Bilateral;  FILSHIE CLIPS   NASAL SINUS SURGERY     TUBAL LIGATION     tubes in ears     VAGINAL HYSTERECTOMY Bilateral 01/15/2020   Procedure: HYSTERECTOMY VAGINAL;  Surgeon: Chancy Milroy, MD;  Location: [place];  Service: Gynecology;  Laterality: Bilateral;   WISDOM TOOTH EXTRACTION      Family Psychiatric History: Paternal grandmother depression Family History:  Family History  Problem Relation Age of Onset   Healthy Father    Healthy Mother    Arthritis Maternal Grandmother    Arthritis Maternal Grandfather    Depression Paternal Grandmother     Social History:  Social History   Socioeconomic History   Marital status: Married    Spouse name: Not on file   Number of children: 0   Years of education: 14   Highest education level: Not on file  Occupational History   Occupation: Unemployed  Tobacco Use   Smoking status: Every Day    Packs/day: 0.50    Years: 15.00    Pack years: 7.50    Types: Cigarettes   Smokeless tobacco: Never  Vaping Use   Vaping Use: Every day   Substances: Nicotine   Substance and Sexual Activity   Alcohol use: Never   Drug use: No   Sexual activity: Not on file  Other Topics Concern   Not on file  Social History Narrative   Lives at home alone.   Right-handed.   3 glasses of green tea per day.   Social Determinants of Health   Financial Resource Strain: Not on file  Food Insecurity: Not on file  Transportation Needs: Not on file  Physical Activity: Not on file  Stress: Not on file  Social Connections: Not on file    Allergies:  Allergies  Allergen Reactions   Keflex [Cephalexin] Anaphylaxis    Metabolic Disorder Labs: No results found for: HGBA1C, MPG No results found for: PROLACTIN Lab Results  Component Value Date   TRIG  341 (H) 04/03/2019   Lab Results  Component Value Date   TSH 4.304 07/23/2020   TSH 4.809 (H) 06/30/2020    Therapeutic Level Labs: Lab Results  Component Value Date   LITHIUM <0.06 (L) 07/30/2019   LITHIUM <0.06 (L) 03/30/2019   No results found for: VALPROATE No components found for:  CBMZ  Current Medications: Current Outpatient Medications  Medication Sig Dispense Refill   ARIPiprazole (ABILIFY) 5 MG tablet TAKE 1 TABLET (5 MG TOTAL) BY MOUTH DAILY. 30 tablet 2   AUSTEDO 9 MG TABS TAKE 2 TABLETS BY MOUTH TWICE A DAY (Patient not taking: Reported on 06/30/2020) 120 tablet 2   beclomethasone (QVAR) 40 MCG/ACT inhaler Inhale 1 puff into the lungs 2 (two) times daily as needed (asthma).      botulinum toxin Type A (BOTOX) 100 units SOLR injection Inject 200 Units into the muscle every 3 (three) months. Inject into head and neck muscles 2 each 3   clonazePAM (KLONOPIN) 1 MG tablet Take 1 mg by mouth 4 (four) times daily.     DULoxetine (CYMBALTA) 30 MG capsule Take 1 capsule (30 mg total) by mouth daily. 30 capsule 2   Fremanezumab-vfrm (AJOVY) 225 MG/1.5ML SOAJ Inject 225 mg into the skin every 30 (thirty) days. 1.5 mL 11   gabapentin (NEURONTIN) 300 MG capsule Take 1 capsule (300 mg total) by mouth 3  (three) times daily as needed. 90 capsule 0   Multiple Vitamins-Calcium (ONE-A-DAY WOMENS FORMULA PO) Take 1 tablet by mouth daily.     SUMAtriptan (IMITREX) 100 MG tablet Take 1 tablet (100 mg total) by mouth every 2 (two) hours as needed for migraine. MAY DOSE 2 PER DAY OR 3 12 tablet 11   traMADol (ULTRAM) 50 MG tablet Take 50 mg by mouth 4 (four) times daily as needed for pain.     No current facility-administered medications for this visit.     Musculoskeletal: Strength & Muscle Tone:  Unable to assess due to telephone visit Gait & Station:  Unable to assess due to telephone visit Patient leans: N/A  Psychiatric Specialty Exam: Review of Systems  There were no vitals taken for this visit.There is no height or weight on file to calculate BMI.  General Appearance:  Unable to assess due to telephone visit  Eye Contact:   Unable to assess due to telephone visit  Speech:  Clear and Coherent and Normal Rate  Volume:  Normal  Mood:  Anxious and Depressed  Affect:  Congruent  Thought Process:  Coherent, Goal Directed and Linear  Orientation:  Full (Time, Place, and Person)  Thought Content: WDL and Logical   Suicidal Thoughts:  No  Homicidal Thoughts:  No  Memory:  Immediate;   Good Recent;   Good Remote;   Good  Judgement:  Good  Insight:  Good  Psychomotor Activity:  Normal  Concentration:  Concentration: Good and Attention Span: Good  Recall:  Good  Fund of Knowledge: Good  Language: Good  Akathisia:  No  Handed:  Right  AIMS (if indicated):Done  Assets:  Communication Skills Desire for Improvement Financial Resources/Insurance Housing Social Support  ADL's:  Intact  Cognition: WNL  Sleep:  Good   Screenings: AIMS    Flowsheet Row Clinical Support from 11/26/2019 in [facility] Admission (Discharged) from 04/10/2017 in [facility]  AIMS Total Score 5 0      GAD-7    Flowsheet Row Video Visit from  08/11/2020 in [facility] Clinical Support from 03/31/2020 in [facility]  Total GAD-7 Score 13 [place] [facility] Visit from 10/05/2016 in [facility]  Total Score (max 30 points ) 26      PHQ2-9    Flowsheet Row Video Visit from 08/11/2020 in [facility] ED from 08/05/2020 in [place] DEPT Clinical Support from 03/31/2020 in [facility] ED from 09/06/2019 in [facility]  PHQ-2 Total Score 4 4 6 1   PHQ-9 Total Score 15 8 19  --      Flowsheet Row Video Visit from 08/11/2020 in [facility] ED from 08/05/2020 in [place] DEPT ED from 07/23/2020 in [place] No Risk No Risk No Risk        Assessment and Plan: Patient endorse anxiety and depression Today she notes that she does not want her medications adjusted.  She will continue Cymbalta as prescribed.  Patient notes that she will be awaiting a call from [facility] regarding her ECT treatments.  She will follow-up with provider in 2 months for further evaluation.   1. Bipolar 1 disorder, depressed (HCC)  Continue- DULoxetine (CYMBALTA) 30 MG capsule; Take 1 capsule (30 mg total) by mouth daily.  Dispense: 30 capsule; Refill: 2   2. Body dysmorphic disorder     Follow-up in 2 months. Follow-up with outpatient therapy   Salley Slaughter, NP 08/11/2020, 5:30 PM

## 2020-08-14 ENCOUNTER — Other Ambulatory Visit: Payer: Self-pay

## 2020-08-14 ENCOUNTER — Ambulatory Visit: Payer: Medicaid Other | Attending: Internal Medicine

## 2020-08-14 DIAGNOSIS — R2681 Unsteadiness on feet: Secondary | ICD-10-CM | POA: Diagnosis present

## 2020-08-14 DIAGNOSIS — R208 Other disturbances of skin sensation: Secondary | ICD-10-CM | POA: Diagnosis present

## 2020-08-14 DIAGNOSIS — M79671 Pain in right foot: Secondary | ICD-10-CM | POA: Insufficient documentation

## 2020-08-14 DIAGNOSIS — M79672 Pain in left foot: Secondary | ICD-10-CM

## 2020-08-14 DIAGNOSIS — M6281 Muscle weakness (generalized): Secondary | ICD-10-CM | POA: Diagnosis present

## 2020-08-14 NOTE — Patient Instructions (Signed)
   [id number]

## 2020-08-14 NOTE — Therapy (Addendum)
[place], [place], [address] Phone: [phone]   Fax:  [phone]  Physical Therapy Evaluation  Patient Details  Name: Carmen Daniels MRN: [id number] Date of Birth: 12/10/1985 Referring Provider (PT): [name]   Encounter Date: 08/14/2020   PT End of Session - 08/14/20 1749     Visit Number 1    Number of Visits 13    Date for PT Re-Evaluation 09/25/20    Authorization Type [place] [employer]    Authorization Time Period Pending authorization    PT Start Time 6962    PT Stop Time 1710    PT Time Calculation (min) 55 min    Equipment Utilized During Treatment Gait belt    Activity Tolerance Patient limited by pain;Patient tolerated treatment well    Behavior During Therapy [facility] for tasks assessed/performed             Past Medical History:  Diagnosis Date   Acute respiratory failure with hypoxia (HCC)    Acute rhinosinusitis 07/03/2018   Allergic rhinitis    Anxiety    Asthma    Back pain    Bipolar disorder (HCC)    Borderline personality disorder (HCC)    Bupropion overdose    Chronic pain    Chronic pain syndrome    Depression    Fibromyalgia    generalized   Hallucinations    Migraines    Nasal polyps    Nausea    Neck pain    OCD (obsessive compulsive disorder)    OCD (obsessive compulsive disorder)    Ovarian cyst    left   Pneumonia    2021   PONV (postoperative nausea and vomiting)    Schizophrenia ([place])    schizoaffective    Past Surgical History:  Procedure Laterality Date   ABDOMINAL HYSTERECTOMY     LAPAROSCOPIC TUBAL LIGATION Bilateral 06/13/2019   Procedure: LAPAROSCOPIC TUBAL LIGATION;  Surgeon: Chancy Milroy, MD;  Location: [facility];  Service: Gynecology;  Laterality: Bilateral;  FILSHIE CLIPS   NASAL SINUS SURGERY     TUBAL LIGATION     tubes in ears     VAGINAL HYSTERECTOMY Bilateral 01/15/2020   Procedure: HYSTERECTOMY VAGINAL;  Surgeon: Chancy Milroy, MD;  Location: [facility];  Service: Gynecology;  Laterality: Bilateral;   WISDOM TOOTH EXTRACTION      There were no vitals filed for this visit.    Subjective Assessment - 08/14/20 1606     Subjective Pt reports primary c/o of BIL LE and foot pain s/p fall after passing out and hitting her head on May 6th. She reports losing consciousness for several hours and was later diagnosed with rhabdomyolysis in BIL LE. She was also diagnosed with acute renal failure, as well as liver damage, at that time. Pt reports N/T in BIL global feet with no N/T in rest of BIL LE. She experiences temperature changes in her feet muliple times a week with them sometimes being burning hot and sometimes being freezing cold. Pt denies that her LE pain or N/T is related to LBP, although the pt does report LBP that predates her LE pain by years. Pt reports she has never had LE pain prior to her fall. The pt adds that her symptoms have not vastly changed in the past 2 months, although she notes it may have gotten very slightly better. She adds that her typical pain related to fibromyalgia does not compare to  her new pain. Pt reports not walking since May 20th after a 2 week stay at the hospital. She presents in a wheelchair accompanied by her mother. Pt has an appointment with neurology on 08/19/2020 to address her neuropathy. Red flag screening for cancer and cauda equina negative.    Patient is accompained by: Family member   Mother   Limitations Sitting;Lifting;Standing;Walking;Writing;House hold activities    How long can you sit comfortably? 10 minutes; pt stays in bed when at home for most hours during the day    How long can you stand comfortably? Unable    How long can you walk comfortably? Unable    Patient Stated Goals Pt would like to be able to walk again without pain. She would also like to get back to swimming, driving, and bike riding, as well as playing with her dogs.    Currently in Pain? Yes    Pain  Score 8     Pain Location Foot   Also BIL calf soreness   Pain Orientation Right;Left    Pain Descriptors / Indicators Stabbing    Pain Type --   Subacute   Pain Onset --   1.5 months ago   Pain Frequency Constant    Aggravating Factors  Standing, sitting, walking    Pain Relieving Factors Laying down, resting, hydrocodone    Effect of Pain on Daily Activities Pt unable to live a normal life, being confined to her bed most hours of the day.    Multiple Pain Sites No                [facility] PT Assessment - 08/14/20 0001       Assessment   Medical Diagnosis Acute kidney failure with tubular necrosis (N17.0)    Referring Provider (PT) [name]    Onset Date/Surgical Date 06/26/20    Hand Dominance Right    Next MD Visit 08/19/2020    Prior Therapy Yes, 10 years ago for neck and back pain      Precautions   Precautions None      Restrictions   Weight Bearing Restrictions No      Balance Screen   Has the patient fallen in the past 6 months Yes    How many times? 1   The fall leading to her problems related to this visit.   Has the patient had a decrease in activity level because of a fear of falling?  Yes    Is the patient reluctant to leave their home because of a fear of falling?  Yes      [place] Private residence    Living Arrangements Parent   Mother/ father   Available Help at Discharge Family    Type of [place] Access Level entry    Home Layout One level    [place] - 2 wheels;Wheelchair - Education administrator (comment)   "Potty chair"     Prior Function   Level of Independence Independent      Observation/Other Assessments   Observations Pt in wheelchair at time of eval; Pt's BIL ankles appear hypotonic when in a non-dependent position      Sensation   Light Touch Impaired Detail    Light Touch Impaired Details Impaired RLE;Impaired LLE   L4 dermatome on L diminished compared to R   Hot/Cold Impaired Detail     Hot/Cold Impaired Details --   Impaired BIL plantar feet  Proprioception Impaired Detail    Proprioception Impaired Details --   Positive findings for BIL great toe; negative findings at BIL ankle   Additional Comments Course, soft discrimination positive at BIL plantar feet      Deep Tendon Reflexes   Patella DTR 2+      Strength   Right Hip Flexion 4/5    Right Hip ABduction 4/5    Left Hip Flexion 4/5    Left Hip ABduction 4/5    Right Knee Flexion 5/5    Right Knee Extension 5/5    Left Knee Flexion 4/5    Left Knee Extension 4/5    Right Ankle Dorsiflexion 3-/5   with pain   Right Ankle Plantar Flexion 3-/5   with pain   Left Ankle Dorsiflexion 3-/5   with pain   Left Ankle Plantar Flexion 3-/5   with pain     Palpation   Palpation comment TTP to BIL dorsal and plantar foot with noted bony tenderness at BIL navicular bones and bases of 5th mets      Special Tests   Other special tests Capillary refill time <3 seconds in BIL feet and ankles, WNL      Transfers   Squat Pivot Transfers 2: [employer] risk analyst Details (indicate cue type and reason) Pt demonstrates minimal control on descent to chair                        Objective measurements completed on examination: See above findings.               PT Education - 08/14/20 1747     Education Details Pt educated about pain science and the principle of introducing noxious stimuli and deep pressure when applying gate control theory. Pt also educated on proper form when performing newly added exercises and importance of regular HEP adherence.    Person(s) Educated Patient;Parent(s)    Methods Explanation;Demonstration;Verbal cues;Handout    Comprehension Verbal cues required;Returned demonstration;Verbalized understanding              PT Short Term Goals - 08/14/20 1809       PT SHORT TERM GOAL #1   Title Pt will demonstrate understanding and report adherence to an HEP to  independently address her primary impairments in an effort to promote functional ability.    Baseline HEP given at eval    Time 3    Period Weeks    Status New    Target Date 09/04/20               PT Long Term Goals - 08/14/20 1811       PT LONG TERM GOAL #1   Title Pt will demonstrate global BIL ankle MMT of 4/5 or higher in order to promote walking ability.    Baseline Pt BIL gros ankle MMT 3-/5    Time 6    Period Weeks    Status New    Target Date 09/25/20      PT LONG TERM GOAL #2   Title Pt will demonstrate ability to stand >5 minutes in order to progress to showering and toileting independently.    Baseline Pt unable to stand due to pain.    Time 6    Period Weeks    Status New    Target Date 09/25/20      PT LONG TERM GOAL #3   Title Pt will demonstrate  ability to walk at self-selected pace for >5 minutes in order to progress to walking her dogs without limitation.    Baseline Pt unable to walk.    Time 6    Period Weeks    Status New    Target Date 09/25/20      PT LONG TERM GOAL #4   Title Pt will demonstrate intact hot/cold and course/soft discrimination in BIL feet in order to protect her feet from injury when walking.    Baseline Poor hot/cold and course/soft discrimination in BIL feet    Time 6    Period Weeks    Status New    Target Date 09/25/20      PT LONG TERM GOAL #5   Title Pt will report ability to sit EOB >30 minutes with <5/10 pain in order to sit to watch a movie with minimal limitation.    Baseline Pt experiences 8/10 pain when sitting >10 minutes.    Time 6    Period Weeks    Status New    Target Date 09/25/20                    Plan - 08/14/20 1751     Clinical Impression Statement Pt is a 35yo F who presents with primary c/o BIL foot pain with N/T s/p a fall on May 6th resulting in ARF and rhabdomyolysis. The pt reports that her ARF and rhabdo have been addressed/ managed medically, but her foot pain persists and has  not been relieved since the initial injury. Upon assessment, her primary impairments include poor hot/ cold discrimitation in BIL feet, poor course/ soft discrimination in BIL feet, poor great toe proprioception BIL, weak hip, knee, and ankle musculature BIL with severely limited ankle MMT, TTP to BIL feet with bony tenderness to BIL navicular bones and bases of 5th mets, BIL foot drop and low tone assessed in a non-dependent position, and heightened pain which limits the pt's functional ability.  The pt's sxs are indicative of chronic regional pain syndrome (CRPS). Cannot rule out BIL foot fracture according to [place] foot rules. The pt was not able to take any weight-bearing steps following her injury and presents with bony tenderness to BIL navicular bones and bases of 5th metatarsals. Recommend BIL foot X-rays to rule out suspicion of possible fracture. Also recommend nerve conduction study to rule in/ out neural contribution to pt's presentation. Pt will benefit from skilled PT to address her primary impairments and help her return to her prior level of function without limitation. Pt may also benefit from aquatic therapy to address her impairments in a gravity-eliminated environment.    Personal Factors and Comorbidities Comorbidity 3+    Comorbidities See medical history above    Examination-Activity Limitations Bathing;Locomotion Level;Transfers;Bed Mobility;Bend;Sit;Carry;Squat;Dressing;Stairs;Stand;Hygiene/Grooming;Toileting;Lift    Examination-Participation Restrictions Cleaning;Community Activity;Driving;Laundry    Stability/Clinical Decision Making Evolving/Moderate complexity    Clinical Decision Making Moderate    Rehab Potential Good    PT Frequency 2x / week    PT Duration 6 weeks    PT Treatment/Interventions Aquatic Therapy;ADLs/Self Care Home Management;Biofeedback;Cryotherapy;Electrical Stimulation;DME Instruction;Ultrasound;Moist Heat;Gait training;Stair training;Functional mobility  training;Therapeutic activities;Therapeutic exercise;Balance training;Neuromuscular re-education;Manual techniques;Patient/family education;Taping;Passive range of motion;Dry needling;Spinal Manipulations    PT Next Visit Plan Assess response to standing, progress LE strengthening exercises    PT Home Exercise Plan [id number]    Recommended Other Services Aquatic therapy, nerve conduction study for LE, X-rays for BIL feet    Consulted and Agree with Plan of  Care Patient             Patient will benefit from skilled therapeutic intervention in order to improve the following deficits and impairments:  Abnormal gait, Decreased activity tolerance, Decreased balance, Decreased mobility, Decreased strength, Impaired sensation, Postural dysfunction, Improper body mechanics, Pain, Impaired UE functional use, Difficulty walking, Decreased endurance  Visit Diagnosis: Other disturbances of skin sensation  Pain in left foot  Pain in right foot  Muscle weakness (generalized)  Unsteadiness on feet     Problem List Patient Active Problem List   Diagnosis Date Noted   Malnutrition of moderate degree 07/01/2020   Acute renal failure ([place]) 06/30/2020   Non-traumatic rhabdomyolysis    Elevated LFTs    Tardive dyskinesia 11/14/2019   Bipolar I disorder, most recent episode depressed ([place]) 09/13/2019   Social anxiety disorder 09/13/2019   Adjustment disorder with mixed anxiety and depressed mood 07/31/2019   Suicidal ideation    Post-operative state 07/19/2019   Seizures ([place]) 06/08/2019   Bipolar 1 disorder, depressed ([place]) 05/17/2019   Status epilepticus ([place]) 05/17/2019   Intentional benzodiazepine overdose ([place]) 03/29/2019   Intractable chronic 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 status migrainosus 02/08/2019   History of elective abortion 12/28/2018   QT prolongation 07/03/2018   AMS (altered mental status) 07/03/2018   Serum total bilirubin elevated 07/03/2018   Syncope 07/02/2018    Borderline personality disorder ([place]) 04/11/2017   Bipolar I disorder, current or most recent episode manic, with psychotic features ([place]) 04/10/2017   Bipolar disorder ([place]) 04/10/2017   Abnormal MRI of head 04/21/2016   Chronic migraine without aura 03/27/2015   Mild persistent asthma 12/30/2014   Allergic rhinitis due to pollen 12/30/2014   Rhinitis medicamentosa 12/30/2014   Nasal polyposis 12/30/2014    Vanessa [place], PT, DPT 08/14/20 6:20 PM   [facility] [place], [place], [address] Phone: [phone]   Fax:  [phone]  Name: MARNI FRANZONI MRN: [id number] Date of Birth: 1985/12/10  08/15/2020- Spoke with 67 nurse regarding ordering of pt's BIL foot X-rays and BLE nerve conduction study. Received verbal confirmation from nurse regarding ordering of pt's X-rays and prospectively addressing a nerve conduction study for the pt.

## 2020-08-18 NOTE — Telephone Encounter (Signed)
Spoke with Debbie in billing, it will be fine for both to be done. We are only billing the administration code, not the J code as she is enrolled in a specialty pharmacy. We already have her Botox here. I called patient's mother, Manuela Schwartz, to let her know Botox will be done tomorrow as well.

## 2020-08-18 NOTE — Telephone Encounter (Signed)
Received VM from patient's mother, Carmen Daniels, stating patient is coming in to see Dr. Krista Blue tomorrow for OV. She would like to know if Botox can be done at this appointment as well. She is overdue.

## 2020-08-19 ENCOUNTER — Ambulatory Visit: Payer: Medicaid Other | Admitting: Neurology

## 2020-08-19 ENCOUNTER — Encounter: Payer: Self-pay | Admitting: Neurology

## 2020-08-19 VITALS — BP 123/97 | HR 79

## 2020-08-19 DIAGNOSIS — G43719 Chronic migraine without aura, intractable, without status migrainosus: Secondary | ICD-10-CM | POA: Diagnosis not present

## 2020-08-19 DIAGNOSIS — R269 Unspecified abnormalities of gait and mobility: Secondary | ICD-10-CM | POA: Diagnosis not present

## 2020-08-19 DIAGNOSIS — F319 Bipolar disorder, unspecified: Secondary | ICD-10-CM

## 2020-08-19 DIAGNOSIS — R531 Weakness: Secondary | ICD-10-CM

## 2020-08-19 DIAGNOSIS — M792 Neuralgia and neuritis, unspecified: Secondary | ICD-10-CM

## 2020-08-19 MED ORDER — DULOXETINE HCL 30 MG PO CPEP: 60.0000 mg | ORAL_CAPSULE | Freq: Every day | ORAL | 11 refills | Status: DC

## 2020-08-19 MED ORDER — OXCARBAZEPINE 150 MG PO TABS: 300.0000 mg | ORAL_TABLET | Freq: Two times a day (BID) | ORAL | 11 refills | Status: DC

## 2020-08-19 MED ORDER — DULOXETINE HCL 60 MG PO CPEP: 60.0000 mg | ORAL_CAPSULE | Freq: Every day | ORAL | 3 refills | Status: DC

## 2020-08-19 NOTE — Progress Notes (Signed)
**  Botox 200 units x 1 vial, [place] 4469-5072-25, Lot [id number], Exp 02/2023, specialty pharmacy.//mck,rn**

## 2020-08-19 NOTE — Progress Notes (Signed)
Botox injection for chronic migraine prevention, injection was performed according to [place] protocol,  5 units of Botox was injected into each side, for 31 injection sites, total of 155 units  Bilateral frontalis 4 injection sites Bilateral corrugate 2 injection sites Procerus 1 injection sites. Bilateral temporalis 8 injection sites Bilateral occipitalis 6 injection sites Bilateral cervical paraspinals 4 injection sites Bilateral upper trapezius 6 injection sites  Extra 45 unites were injected into bilateral levator scapular, and upper cervical paraspinals.

## 2020-08-19 NOTE — Progress Notes (Signed)
Chief Complaint  Patient presents with   Procedure    Botox - migraines   Consult    Room 62 w/ mother, Carmen Daniels. Consult for cramping, pain and weakness in lower extremities. Also, bilateral foot drop.      ASSESSMENT AND PLAN  Carmen Daniels is a 35 y.o. female   Acute onset of bilateral distal leg weakness, neuropathic pain following her prolonged loss of consciousness, rhabdomyolysis,  Examination showed normal sensation of bilateral lower extremity, hyperreflexia, profound bilateral ankle dorsiflexion/plantarflexion, eversion, inversion weakness, barely against gravity  Potential localized to upper motor neuron track  MRI of brain, and cervical cord,  EMG nerve conduction study  Neuropathic pain  Increase Cymbalta to 60 mg daily, add on Trileptal titrating to 150 mg 2 tablets twice a day  Chronic migraine headaches  Botox injection as migraine prevention  Continue Ajovy to 25 mg every 30 days  Imitrex 100 mg as needed   DIAGNOSTIC DATA (LABS, IMAGING, TESTING) - I reviewed patient records, labs, notes, testing and imaging myself where available. MRI on July 23 2020 Mild lower lumbar facet arthrosis with left foraminal annular fissure at L4-5. This could irritate the left L4 nerve root. No foraminal stenosis.   MRI thoracic: Normal thoracic spine.  CT head and cervical on May 8th 2022: No acute intracranial abnormality noted.   CT of the cervical spine: No acute bony abnormality is noted. Mild degenerative changes are seen as well as findings suggestive of muscular spasm.    CT abdomen on August 06 2020 No acute or inflammatory process identified in the abdomen or pelvis.   Normal appendix.  Surgically absent uterus.  Laboratory: CPK upon admission was 4925, 18 on August 06, 2020, CBC, Wbc 11, Hg 15.7, Hg 13.6, CMP, creat 0.87, TSH 4.3,   HISTORICAL Carmen Daniels has of being a patient of our office for many years for chronic migraine headaches, she also has  history of bipolar disorder, suicidal attempt in the past, hospital discharge in February 2021 for polypharmacy overdose, status epilepticus   She was admitted to the hospital on March 28, 2019 with intentional overdose, this including Wellbutrin, Ambien, Flexeril, then she was noted to have intermittent whole body shaking, progressed to have seizure-like activity, was intubated on March 29, 2019, was loaded with Versed, Dilantin, she was noted to have 10 to 15 seconds of seizure activity followed by quiescence.  Then she was loaded with IV Keppra, midazolam, followed by propofol drip, which stopped the clinical seizure activity, later continuous EEG showed diffuse continuous slowing, system with severe encephalopathy, she was seen by epileptologist Dr. Hortense Ramal on April 05, 2019, her seizure were likely provoked in the setting of Wellbutrin overdose, most likely she will not require long-term antiemetic medications, continue seizure precaution, gradually wean off Keppra,   Her hospital course was also complicated by aspiration pneumonia, right hand cellulitis, was treated with Levaquin, Flagyl.  There was evidence of prolonged QTC, likely due to Wellbutrin,   MRI of the brain on April 04, 2019 showed no acute abnormality, chronic arachnoid cyst at the right cranial fossa,   Continuous EEG monitoring showed severe diffuse encephalopathy, continuous generalized slowing,   Laboratory evaluation on March 28, 2019 showed positive for benzodiazepine, lithium level was 0.11, plasma lactic acid was 4.1,   Most recent laboratory evaluation in February 2021, CBC showed mild anemia hemoglobin of 11.3, BMP showed no significant abnormalities   She previously on polypharmacy for her mood disorder, now only  on Zyprexa 50 mg at bedtime   For a while she compalins of short-term memory loss, had no recurrent seizure-like activity, has moved in with her parents, also complains of increased migraine  headaches,  She began to receive Botox injection as migraine prevention since March 2017,    Her EEG was normal on May 31, 2019.  She does not have recurrent seizure, she did have status epilepticus during her hospital admission on March 28, 2019 due to polypharmacy overdose, she is not taking Keppra 750 mg twice a day, I have suggested her for total treatment of 6 months, stepping down to Keppra 500 mg for 2 months, then 250 mg for 2 months, then stop,  UPDATE June 28th 2022: She is accompanied by her mother for evaluation of bilateral distal leg weakness, Botox injection as migraine prevention  She stopped all of her psychiatric medications before hospital admission on May 8th, 2022, she was found down at home staying on floor for about 8 hours, she was concerned about her weight gain, restricting her intake to very little fluid, almost no solid food days prior to that.  She was able to crawl to her bathroom, was in bed for 2 days, prior to coming to emergency room, noticed weakness in legs, pain all over, elevated CPK 26,755,  UDS was positive for benzodiazepine, creatinine of 4.52 from baseline 0.8, AST 1362, ALT of 1510,   She was diagnosed with acute renal failure secondary to rhabdomyolysis, and liver injury, with consistent bilateral lower extremity neuropathic pain, distal leg weakness, bilateral foot drop since then, moderate malnutrition, was discharged home to her parents  Since that event, she continues to have moderate to severe bilateral foot, distal leg pain, stabbing, burning, cannot bear weight, profound weakness, no longer ambulatory,  Also complains of worsening headaches, taking gabapentin 300 3 times daily for fibromyalgia, Percocet 5/325 mg 4 times a day for body achy pain, Imitrex about once a week as needed for headaches, denies bowel and bladder incontinence, denies upper extremity paresthesia or weakness, complains diffuse body achy pain, difficulty sleeping, taking  Ambien 10 mg every night, despite that, difficulty staying sleep    PHYSICAL EXAM:   Vitals:   Not recorded     There is no height or weight on file to calculate BMI.  PHYSICAL EXAMNIATION:  Gen: NAD, conversant, well nourised, well groomed                     Cardiovascular: Regular rate rhythm, no peripheral edema, warm, nontender. Eyes: Conjunctivae clear without exudates or hemorrhage Neck: Supple, no carotid bruits. Pulmonary: Clear to auscultation bilaterally   NEUROLOGICAL EXAM:  MENTAL STATUS: Speech:    Speech is normal; fluent and spontaneous with normal comprehension.  Cognition:     Orientation to time, place and person     Normal recent and remote memory     Normal Attention span and concentration     Normal Language, naming, repeating,spontaneous speech     Fund of knowledge   CRANIAL NERVES: CN II: Visual fields are full to confrontation. Pupils are round equal and briskly reactive to light. CN III, IV, VI: extraocular movement are normal. No ptosis. CN V: Facial sensation is intact to light touch CN VII: Face is symmetric with normal eye closure  CN VIII: Hearing is normal to causal conversation. CN IX, X: Phonation is normal. CN XI: Head turning and shoulder shrug are intact  MOTOR: Bilateral upper extremity muscle tone and  strength was normal  LE Hip Flexion Knee flexion Knee extension Ankle Dorsiflexion Eversion Ankle plantar Flexion Inversion  R 5 5 5 3 3 3 3   L 5 5 5 3 3 3 3      REFLEXES: Reflexes are 3 and symmetric at the biceps, triceps, knees, and absent at ankles. Plantar responses are  extensor bilaterally  SENSORY: Intact to light touch, pinprick and vibratory sensation are intact in fingers and toes.  COORDINATION: There is no trunk or limb dysmetria noted.  GAIT/STANCE: Deferred.  REVIEW OF SYSTEMS:  Full 14 system review of systems performed and notable only for as above All other review of systems were  negative.   ALLERGIES: Allergies  Allergen Reactions   Keflex [Cephalexin] Anaphylaxis    HOME MEDICATIONS: Current Outpatient Medications  Medication Sig Dispense Refill   beclomethasone (QVAR) 40 MCG/ACT inhaler Inhale 1 puff into the lungs 2 (two) times daily as needed (asthma).      botulinum toxin Type A (BOTOX) 100 units SOLR injection Inject 200 Units into the muscle every 3 (three) months. Inject into head and neck muscles 2 each 3   clonazePAM (KLONOPIN) 1 MG tablet Take 1 mg by mouth 4 (four) times daily.     DULoxetine (CYMBALTA) 30 MG capsule Take 1 capsule (30 mg total) by mouth daily. 30 capsule 2   Fremanezumab-vfrm (AJOVY) 225 MG/1.5ML SOAJ Inject 225 mg into the skin every 30 (thirty) days. 1.5 mL 11   gabapentin (NEURONTIN) 300 MG capsule Take 1 capsule (300 mg total) by mouth 3 (three) times daily as needed. 90 capsule 0   Multiple Vitamins-Calcium (ONE-A-DAY WOMENS FORMULA PO) Take 1 tablet by mouth daily.     oxyCODONE-acetaminophen (PERCOCET/ROXICET) 5-325 MG tablet Take 1 tablet by mouth every 6 (six) hours as needed for severe pain.     SUMAtriptan (IMITREX) 100 MG tablet Take 1 tablet (100 mg total) by mouth every 2 (two) hours as needed for migraine. MAY DOSE 2 PER DAY OR 3 12 tablet 11   zolpidem (AMBIEN) 10 MG tablet Take 10 mg by mouth at bedtime.     No current facility-administered medications for this visit.    PAST MEDICAL HISTORY: Past Medical History:  Diagnosis Date   Acute respiratory failure with hypoxia (HCC)    Acute rhinosinusitis 07/03/2018   Allergic rhinitis    Anxiety    Asthma    Back pain    Bipolar disorder (HCC)    Borderline personality disorder (HCC)    Bupropion overdose    Chronic pain    Chronic pain syndrome    Depression    Fibromyalgia    generalized   Hallucinations    Migraines    Nasal polyps    Nausea    Neck pain    OCD (obsessive compulsive disorder)    OCD (obsessive compulsive disorder)    Ovarian cyst     left   Pneumonia    2021   PONV (postoperative nausea and vomiting)    Schizophrenia ([place])    schizoaffective    PAST SURGICAL HISTORY: Past Surgical History:  Procedure Laterality Date   ABDOMINAL HYSTERECTOMY     LAPAROSCOPIC TUBAL LIGATION Bilateral 06/13/2019   Procedure: LAPAROSCOPIC TUBAL LIGATION;  Surgeon: Chancy Milroy, MD;  Location: [facility];  Service: Gynecology;  Laterality: Bilateral;  FILSHIE CLIPS   NASAL SINUS SURGERY     TUBAL LIGATION     tubes in ears     VAGINAL  HYSTERECTOMY Bilateral 01/15/2020   Procedure: HYSTERECTOMY VAGINAL;  Surgeon: Chancy Milroy, MD;  Location: [facility];  Service: Gynecology;  Laterality: Bilateral;   WISDOM TOOTH EXTRACTION      FAMILY HISTORY: Family History  Problem Relation Age of Onset   Healthy Father    Healthy Mother    Arthritis Maternal Grandmother    Arthritis Maternal Grandfather    Depression Paternal Grandmother     SOCIAL HISTORY: Social History   Socioeconomic History   Marital status: Married    Spouse name: Not on file   Number of children: 0   Years of education: 14   Highest education level: Not on file  Occupational History   Occupation: Unemployed  Tobacco Use   Smoking status: Every Day    Packs/day: 0.50    Years: 15.00    Pack years: 7.50    Types: Cigarettes   Smokeless tobacco: Never  Vaping Use   Vaping Use: Every day   Substances: Nicotine  Substance and Sexual Activity   Alcohol use: Never   Drug use: No   Sexual activity: Not on file  Other Topics Concern   Not on file  Social History Narrative   Lives at home alone.   Right-handed.   3 glasses of green tea per day.   Social Determinants of Health   Financial Resource Strain: Not on file  Food Insecurity: Not on file  Transportation Needs: Not on file  Physical Activity: Not on file  Stress: Not on file  Social Connections: Not on file  Intimate Partner Violence: Not on file     Total time  spent reviewing the chart, obtaining history, examined patient, ordering tests, documentation, consultations and family, care coordination was 79 minutes   Marcial Pacas, M.D. Ph.D.  [facility] [address] [place], [place] [address] Ph: [phone] Fax: [phone]  CC:  [name], [place],  [place] [id number]  [name], MD

## 2020-08-20 ENCOUNTER — Other Ambulatory Visit: Payer: Self-pay

## 2020-08-20 ENCOUNTER — Ambulatory Visit: Payer: Medicaid Other

## 2020-08-20 DIAGNOSIS — M79671 Pain in right foot: Secondary | ICD-10-CM

## 2020-08-20 DIAGNOSIS — M6281 Muscle weakness (generalized): Secondary | ICD-10-CM

## 2020-08-20 DIAGNOSIS — R208 Other disturbances of skin sensation: Secondary | ICD-10-CM

## 2020-08-20 DIAGNOSIS — M79672 Pain in left foot: Secondary | ICD-10-CM

## 2020-08-20 DIAGNOSIS — R2681 Unsteadiness on feet: Secondary | ICD-10-CM

## 2020-08-20 NOTE — Therapy (Signed)
[place], [place], [address] Phone: [phone]   Fax:  [phone]  Physical Therapy Treatment  Patient Details  Name: Carmen Daniels MRN: [id number] Date of Birth: 28-Jun-1985 Referring Provider (PT): [name]   Encounter Date: 08/20/2020   PT End of Session - 08/20/20 0909     Visit Number 2    Number of Visits 13    Date for PT Re-Evaluation 09/25/20    Authorization Type [place] [employer]    Authorization Time Period Pending authorization    PT Start Time 0915    PT Stop Time 0955    PT Time Calculation (min) 40 min    Equipment Utilized During Treatment Gait belt    Activity Tolerance Patient limited by pain;Patient tolerated treatment well    Behavior During Therapy [facility] for tasks assessed/performed             Past Medical History:  Diagnosis Date   Acute respiratory failure with hypoxia (HCC)    Acute rhinosinusitis 07/03/2018   Allergic rhinitis    Anxiety    Asthma    Back pain    Bipolar disorder (HCC)    Borderline personality disorder (HCC)    Bupropion overdose    Chronic pain    Chronic pain syndrome    Depression    Fibromyalgia    generalized   Hallucinations    Migraines    Nasal polyps    Nausea    Neck pain    OCD (obsessive compulsive disorder)    OCD (obsessive compulsive disorder)    Ovarian cyst    left   Pneumonia    2021   PONV (postoperative nausea and vomiting)    Schizophrenia ([place])    schizoaffective    Past Surgical History:  Procedure Laterality Date   ABDOMINAL HYSTERECTOMY     LAPAROSCOPIC TUBAL LIGATION Bilateral 06/13/2019   Procedure: LAPAROSCOPIC TUBAL LIGATION;  Surgeon: Chancy Milroy, MD;  Location: [place];  Service: Gynecology;  Laterality: Bilateral;  FILSHIE CLIPS   NASAL SINUS SURGERY     TUBAL LIGATION     tubes in ears     VAGINAL HYSTERECTOMY Bilateral 01/15/2020   Procedure: HYSTERECTOMY VAGINAL;  Surgeon: Chancy Milroy, MD;  Location: [place];  Service: Gynecology;  Laterality: Bilateral;   WISDOM TOOTH EXTRACTION      There were no vitals filed for this visit.   Subjective Assessment - 08/20/20 0910     Subjective Pt presents to PT with continued reports of bilateral foot and LE pain/paresthesia. She has not been compliant with her HEP to this point. Pt is ready to begin PT treatment at this time. Pt has NCV study on 7/6 schedule by neurology.    Patient is accompained by: Family member   pt's mother   Currently in Pain? Yes    Pain Score 9     Pain Location Foot    Pain Orientation Right;Left                               [facility] Adult PT Treatment/Exercise - 08/20/20 0001       Exercises   Exercises Knee/Hip      Knee/Hip Exercises: Stretches   Other Knee/Hip Stretches prolonged propped knee ext stretch with chair and quad sets x 2 min      Knee/Hip Exercises: Standing   Other Standing  Knee Exercises standing in // 3x30 sec    Other Standing Knee Exercises standing weight shifts in // x 5 ea      Knee/Hip Exercises: Seated   Long Arc Quad 2 sets;10 reps;Both      Knee/Hip Exercises: Supine   Bridges 2 sets;10 reps    Straight Leg Raises 10 reps;Both      Ankle Exercises: Seated   Heel Raises Limitations 2x15 ea                      PT Short Term Goals - 08/14/20 1809       PT SHORT TERM GOAL #1   Title Pt will demonstrate understanding and report adherence to an HEP to independently address her primary impairments in an effort to promote functional ability.    Baseline HEP given at eval    Time 3    Period Weeks    Status New    Target Date 09/04/20               PT Long Term Goals - 08/14/20 1811       PT LONG TERM GOAL #1   Title Pt will demonstrate global BIL ankle MMT of 4/5 or higher in order to promote walking ability.    Baseline Pt BIL gros ankle MMT 3-/5    Time 6    Period Weeks    Status New    Target Date  09/25/20      PT LONG TERM GOAL #2   Title Pt will demonstrate ability to stand >5 minutes in order to progress to showering and toileting independently.    Baseline Pt unable to stand due to pain.    Time 6    Period Weeks    Status New    Target Date 09/25/20      PT LONG TERM GOAL #3   Title Pt will demonstrate ability to walk at self-selected pace for >5 minutes in order to progress to walking her dogs without limitation.    Baseline Pt unable to walk.    Time 6    Period Weeks    Status New    Target Date 09/25/20      PT LONG TERM GOAL #4   Title Pt will demonstrate intact hot/cold and course/soft discrimination in BIL feet in order to protect her feet from injury when walking.    Baseline Poor hot/cold and course/soft discrimination in BIL feet    Time 6    Period Weeks    Status New    Target Date 09/25/20      PT LONG TERM GOAL #5   Title Pt will report ability to sit EOB >30 minutes with <5/10 pain in order to sit to watch a movie with minimal limitation.    Baseline Pt experiences 8/10 pain when sitting >10 minutes.    Time 6    Period Weeks    Status New    Target Date 09/25/20                   Plan - 08/20/20 1150     Clinical Impression Statement Pt was able to complete prescribed exercises despite continued severe bilateral foot and distal LE pain. She was able to progress to standing exercises today, with static holds and weight shifting without adverse autonomic response. She did rely heavily on UEs in parallel bars and placed weight with feet in supinated position, which could have also  been due to motor control issues. Pt continues to benefit from skilled PT services and should continue to be seen and progressed as able.    PT Treatment/Interventions Aquatic Therapy;ADLs/Self Care Home Management;Biofeedback;Cryotherapy;Electrical Stimulation;DME Instruction;Ultrasound;Moist Heat;Gait training;Stair training;Functional mobility training;Therapeutic  activities;Therapeutic exercise;Balance training;Neuromuscular re-education;Manual techniques;Patient/family education;Taping;Passive range of motion;Dry needling;Spinal Manipulations    PT Next Visit Plan progress standing weight shifts as able; progress mat table exercises, may try half kneeling to promote weight through one LE at a time    PT Home Exercise Plan [id number]             Patient will benefit from skilled therapeutic intervention in order to improve the following deficits and impairments:  Abnormal gait, Decreased activity tolerance, Decreased balance, Decreased mobility, Decreased strength, Impaired sensation, Postural dysfunction, Improper body mechanics, Pain, Impaired UE functional use, Difficulty walking, Decreased endurance  Visit Diagnosis: Other disturbances of skin sensation  Pain in left foot  Pain in right foot  Muscle weakness (generalized)  Unsteadiness on feet     Problem List Patient Active Problem List   Diagnosis Date Noted   Weakness 08/19/2020   Gait abnormality 08/19/2020   Neuropathic pain 08/19/2020   Malnutrition of moderate degree 07/01/2020   Acute renal failure ([place]) 06/30/2020   Non-traumatic rhabdomyolysis    Elevated LFTs    Tardive dyskinesia 11/14/2019   Bipolar I disorder, most recent episode depressed ([place]) 09/13/2019   Social anxiety disorder 09/13/2019   Adjustment disorder with mixed anxiety and depressed mood 07/31/2019   Suicidal ideation    Post-operative state 07/19/2019   Seizures ([place]) 06/08/2019   Bipolar 1 disorder, depressed ([place]) 05/17/2019   Status epilepticus ([place]) 05/17/2019   Intentional benzodiazepine overdose ([place]) 03/29/2019   Intractable chronic 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 status migrainosus 02/08/2019   History of elective abortion 12/28/2018   QT prolongation 07/03/2018   AMS (altered mental status) 07/03/2018   Serum total bilirubin elevated 07/03/2018   Syncope 07/02/2018   Borderline  personality disorder ([place]) 04/11/2017   Bipolar I disorder, current or most recent episode manic, with psychotic features ([place]) 04/10/2017   Bipolar disorder ([place]) 04/10/2017   Abnormal MRI of head 04/21/2016   Chronic migraine without aura 03/27/2015   Mild persistent asthma 12/30/2014   Allergic rhinitis due to pollen 12/30/2014   Rhinitis medicamentosa 12/30/2014   Nasal polyposis 12/30/2014    Ward Chatters, PT, DPT 08/20/20 11:57 AM  [facility] [address] [place], [place], [address] Phone: [phone]   Fax:  [phone]  Name: TIYAH ZELENAK MRN: [id number] Date of Birth: May 27, 1985

## 2020-08-21 ENCOUNTER — Encounter (HOSPITAL_COMMUNITY): Payer: Medicaid Other | Admitting: Psychiatry

## 2020-08-22 ENCOUNTER — Other Ambulatory Visit: Payer: Self-pay

## 2020-08-22 ENCOUNTER — Ambulatory Visit: Payer: Medicaid Other | Attending: Internal Medicine

## 2020-08-22 DIAGNOSIS — M79671 Pain in right foot: Secondary | ICD-10-CM | POA: Insufficient documentation

## 2020-08-22 DIAGNOSIS — M6281 Muscle weakness (generalized): Secondary | ICD-10-CM | POA: Insufficient documentation

## 2020-08-22 DIAGNOSIS — M79672 Pain in left foot: Secondary | ICD-10-CM | POA: Insufficient documentation

## 2020-08-22 DIAGNOSIS — R208 Other disturbances of skin sensation: Secondary | ICD-10-CM | POA: Diagnosis present

## 2020-08-22 DIAGNOSIS — R2681 Unsteadiness on feet: Secondary | ICD-10-CM | POA: Insufficient documentation

## 2020-08-22 NOTE — Patient Instructions (Signed)
   [id number]

## 2020-08-22 NOTE — Therapy (Signed)
[place], [place], [address] Phone: [phone]   Fax:  [phone]  Physical Therapy Treatment  Patient Details  Name: Carmen Daniels MRN: [id number] Date of Birth: 23-Apr-1985 Referring Provider (PT): [name]   Encounter Date: 08/22/2020   PT End of Session - 08/22/20 1310     Visit Number 3    Number of Visits 13    Date for PT Re-Evaluation 09/25/20    Authorization Type [place] [facility]    Authorization Time Period Pending authorization    PT Start Time 1050    PT Stop Time 1130    PT Time Calculation (min) 40 min    Equipment Utilized During Treatment Gait belt    Activity Tolerance Patient limited by pain;Patient tolerated treatment well;Patient limited by fatigue    Behavior During Therapy [facility] for tasks assessed/performed             Past Medical History:  Diagnosis Date   Acute respiratory failure with hypoxia (HCC)    Acute rhinosinusitis 07/03/2018   Allergic rhinitis    Anxiety    Asthma    Back pain    Bipolar disorder (HCC)    Borderline personality disorder (HCC)    Bupropion overdose    Chronic pain    Chronic pain syndrome    Depression    Fibromyalgia    generalized   Hallucinations    Migraines    Nasal polyps    Nausea    Neck pain    OCD (obsessive compulsive disorder)    OCD (obsessive compulsive disorder)    Ovarian cyst    left   Pneumonia    2021   PONV (postoperative nausea and vomiting)    Schizophrenia ([place])    schizoaffective    Past Surgical History:  Procedure Laterality Date   ABDOMINAL HYSTERECTOMY     LAPAROSCOPIC TUBAL LIGATION Bilateral 06/13/2019   Procedure: LAPAROSCOPIC TUBAL LIGATION;  Surgeon: Chancy Milroy, MD;  Location: [place];  Service: Gynecology;  Laterality: Bilateral;  FILSHIE CLIPS   NASAL SINUS SURGERY     TUBAL LIGATION     tubes in ears     VAGINAL HYSTERECTOMY Bilateral 01/15/2020   Procedure: HYSTERECTOMY  VAGINAL;  Surgeon: Chancy Milroy, MD;  Location: [place];  Service: Gynecology;  Laterality: Bilateral;   WISDOM TOOTH EXTRACTION      There were no vitals filed for this visit.   Subjective Assessment - 08/22/20 1054     Subjective Pt reports pain levels of 7/10 in BIL feet, below her initial baseline of 8/10. She reports having increased soreness the past couple of days since performing weight-bearing exercises in clinic, but this has resolved. She adds that she has decreased temperature changes in her BIL feet since starting therapy. The pt reports moderate adherence to her HEP, performing some, but not all of the exercises.    Patient is accompained by: Family member   Mother   Limitations Sitting;Lifting;Standing;Walking;Writing;House hold activities    Currently in Pain? Yes    Pain Score 7     Pain Location Foot    Pain Orientation Right;Left    Pain Descriptors / Indicators Spasm;Stabbing;Cramping                [employer] PT Assessment - 08/22/20 0001       Observation/Other Assessments   Observations Pt in wheelchair at time of treatment; Pt arrives with hips maximally flexed with  knees held at chest by hands.      Thomas Test    Findings Negative    Side Left;Right                           [facility] Adult PT Treatment/Exercise - 08/22/20 0001       Transfers   Transfers Stand Pivot Transfers;Stand to Sit;Sit to Stand    Sit to Stand 3: Mod assist   x2   Stand to Sit 4: Min assist   x2   Stand Pivot Transfers 4: Min assist   x2     Knee/Hip Exercises: Standing   Other Standing Knee Exercises Walking in // bars with UE support x2 laps    Other Standing Knee Exercises standing staggered stance weight shifts in // bars 2x30sec BIL      Knee/Hip Exercises: Seated   Clamshell with TheraBand Red   2x10   Marching Strengthening   2x10 BIL RTB                   PT Education - 08/22/20 1251     Education Details Pt educated on importance of  regular adherence to her HEP in order to return to her pre-injury baseline. Pt also instructed on proper form when performing new exercises and to have appropriate UE support and assistance when needed.    Person(s) Educated Patient;Parent(s)    Methods Explanation;Demonstration;Tactile cues;Verbal cues;Handout    Comprehension Tactile cues required;Verbal cues required;Returned demonstration;Verbalized understanding              PT Short Term Goals - 08/14/20 1809       PT SHORT TERM GOAL #1   Title Pt will demonstrate understanding and report adherence to an HEP to independently address her primary impairments in an effort to promote functional ability.    Baseline HEP given at eval    Time 3    Period Weeks    Status New    Target Date 09/04/20               PT Long Term Goals - 08/14/20 1811       PT LONG TERM GOAL #1   Title Pt will demonstrate global BIL ankle MMT of 4/5 or higher in order to promote walking ability.    Baseline Pt BIL gros ankle MMT 3-/5    Time 6    Period Weeks    Status New    Target Date 09/25/20      PT LONG TERM GOAL #2   Title Pt will demonstrate ability to stand >5 minutes in order to progress to showering and toileting independently.    Baseline Pt unable to stand due to pain.    Time 6    Period Weeks    Status New    Target Date 09/25/20      PT LONG TERM GOAL #3   Title Pt will demonstrate ability to walk at self-selected pace for >5 minutes in order to progress to walking her dogs without limitation.    Baseline Pt unable to walk.    Time 6    Period Weeks    Status New    Target Date 09/25/20      PT LONG TERM GOAL #4   Title Pt will demonstrate intact hot/cold and course/soft discrimination in BIL feet in order to protect her feet from injury when walking.    Baseline Poor hot/cold and  course/soft discrimination in BIL feet    Time 6    Period Weeks    Status New    Target Date 09/25/20      PT LONG TERM GOAL #5    Title Pt will report ability to sit EOB >30 minutes with <5/10 pain in order to sit to watch a movie with minimal limitation.    Baseline Pt experiences 8/10 pain when sitting >10 minutes.    Time 6    Period Weeks    Status New    Target Date 09/25/20                   Plan - 08/22/20 1312     Clinical Impression Statement Pt tolerated treatment well today, demonstrating the ability to take roughly 20 steps with UE support in parallel bars. She demonstrated ankle, foot, and toe muscle activation throughout weight-bearing activities, showing improved motor control. Due to the pt regularly being in a position in her wheelchair in which her hips are maximally flexed while the pt holds her knees to her chest, a Marcello Moores test was performed to rule out development of potential hip flexor contracture. This testing was negative. The pt will continue to benefit from skilled PT to address her primary impairments and help her return to her pre-injury functional level.    Personal Factors and Comorbidities Comorbidity 3+    Comorbidities See medical history above    Examination-Activity Limitations Bathing;Locomotion Level;Transfers;Bed Mobility;Bend;Sit;Carry;Squat;Dressing;Stairs;Stand;Hygiene/Grooming;Toileting;Lift    Examination-Participation Restrictions Cleaning;Community Activity;Driving;Laundry    Stability/Clinical Decision Making Evolving/Moderate complexity    Clinical Decision Making Moderate    Rehab Potential Good    PT Frequency 2x / week    PT Duration 6 weeks    PT Treatment/Interventions Aquatic Therapy;ADLs/Self Care Home Management;Biofeedback;Cryotherapy;Electrical Stimulation;DME Instruction;Ultrasound;Moist Heat;Gait training;Stair training;Functional mobility training;Therapeutic activities;Therapeutic exercise;Balance training;Neuromuscular re-education;Manual techniques;Patient/family education;Taping;Passive range of motion;Dry needling;Spinal Manipulations    PT Next  Visit Plan progress standing weight shifts/ walking as able; progress mat table exercises, may try half kneeling to promote weight through one LE at a time    PT Home Exercise Plan [id number]    Consulted and Agree with Plan of Care Patient             Patient will benefit from skilled therapeutic intervention in order to improve the following deficits and impairments:  Abnormal gait, Decreased activity tolerance, Decreased balance, Decreased mobility, Decreased strength, Impaired sensation, Postural dysfunction, Improper body mechanics, Pain, Impaired UE functional use, Difficulty walking, Decreased endurance  Visit Diagnosis: Other disturbances of skin sensation  Pain in left foot  Pain in right foot  Muscle weakness (generalized)  Unsteadiness on feet     Problem List Patient Active Problem List   Diagnosis Date Noted   Weakness 08/19/2020   Gait abnormality 08/19/2020   Neuropathic pain 08/19/2020   Malnutrition of moderate degree 07/01/2020   Acute renal failure ([place]) 06/30/2020   Non-traumatic rhabdomyolysis    Elevated LFTs    Tardive dyskinesia 11/14/2019   Bipolar I disorder, most recent episode depressed ([place]) 09/13/2019   Social anxiety disorder 09/13/2019   Adjustment disorder with mixed anxiety and depressed mood 07/31/2019   Suicidal ideation    Post-operative state 07/19/2019   Seizures ([place]) 06/08/2019   Bipolar 1 disorder, depressed ([place]) 05/17/2019   Status epilepticus ([place]) 05/17/2019   Intentional benzodiazepine overdose ([place]) 03/29/2019   Intractable chronic 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 status migrainosus 02/08/2019   History of elective abortion 12/28/2018  QT prolongation 07/03/2018   AMS (altered mental status) 07/03/2018   Serum total bilirubin elevated 07/03/2018   Syncope 07/02/2018   Borderline personality disorder ([place]) 04/11/2017   Bipolar I disorder, current or most recent episode manic, with psychotic features ([place]) 04/10/2017    Bipolar disorder ([place]) 04/10/2017   Abnormal MRI of head 04/21/2016   Chronic migraine without aura 03/27/2015   Mild persistent asthma 12/30/2014   Allergic rhinitis due to pollen 12/30/2014   Rhinitis medicamentosa 12/30/2014   Nasal polyposis 12/30/2014    Vanessa , PT, DPT 08/22/20 2:49 PM   [facility] [address] [place], [place], [address] Phone: [phone]   Fax:  [phone]  Name: JORDIS REPETTO MRN: [id number] Date of Birth: 10/17/1985

## 2020-08-27 ENCOUNTER — Encounter: Payer: Medicaid Other | Admitting: Neurology

## 2020-08-27 ENCOUNTER — Other Ambulatory Visit: Payer: Self-pay

## 2020-08-27 ENCOUNTER — Ambulatory Visit (INDEPENDENT_AMBULATORY_CARE_PROVIDER_SITE_OTHER): Payer: Medicaid Other | Admitting: Neurology

## 2020-08-27 ENCOUNTER — Ambulatory Visit: Payer: Medicaid Other

## 2020-08-27 DIAGNOSIS — G43719 Chronic migraine without aura, intractable, without status migrainosus: Secondary | ICD-10-CM

## 2020-08-27 DIAGNOSIS — R269 Unspecified abnormalities of gait and mobility: Secondary | ICD-10-CM

## 2020-08-27 DIAGNOSIS — M6281 Muscle weakness (generalized): Secondary | ICD-10-CM

## 2020-08-27 DIAGNOSIS — Z0289 Encounter for other administrative examinations: Secondary | ICD-10-CM

## 2020-08-27 DIAGNOSIS — M792 Neuralgia and neuritis, unspecified: Secondary | ICD-10-CM | POA: Diagnosis not present

## 2020-08-27 DIAGNOSIS — R208 Other disturbances of skin sensation: Secondary | ICD-10-CM

## 2020-08-27 DIAGNOSIS — F319 Bipolar disorder, unspecified: Secondary | ICD-10-CM | POA: Diagnosis not present

## 2020-08-27 DIAGNOSIS — M79671 Pain in right foot: Secondary | ICD-10-CM

## 2020-08-27 DIAGNOSIS — M79672 Pain in left foot: Secondary | ICD-10-CM

## 2020-08-27 DIAGNOSIS — R531 Weakness: Secondary | ICD-10-CM | POA: Diagnosis not present

## 2020-08-27 DIAGNOSIS — R2681 Unsteadiness on feet: Secondary | ICD-10-CM

## 2020-08-27 NOTE — Patient Instructions (Signed)
  [id number]

## 2020-08-27 NOTE — Procedures (Signed)
Full Name: Carmen Daniels Gender: Female MRN #: [id number] Date of Birth: [birth year]    Visit Date: 08/27/2020 08:45 Age: 35 Years Examining Physician: Marcial Pacas, MD  Referring Physician: Marcial Pacas, MD History: 35 year old female had a history of rhabdomyolysis on Jun 29, 2020 in the background of poor nutrition, lay on the floor for prolonged period of time, now she had a persistent below the knee muscle pain, distal leg weakness, neuropathic pain, gait abnormality  Summary of the test: Nerve conduction study: Bilateral superficial peroneal, right sural sensory responses were absent.  Left peroneal to EDB motor responses were absent. Bilateral tibial motor responses showed significantly decreased CMAP amplitude  Right ulnar sensory and motor responses were normal  Electromyography: Selected needle examinations were performed at bilateral lower extremity muscles, and right upper extremity muscles, There is evidence of profound spontaneous activities at muscles below knees, such as tibialis anterior, peroneal longus, tibialis posterior, medial gastrocnemius, right worse than left.  With voluntary movement, there was noticeable normal morphology motor unit potential, mixed with complex polyspike motor unit potential, with fairly normal recruitment patterns, occasionally decreased recruitment.  Needle examination at above-knee muscles, such as vastus lateralis, right biceps femoris long head, short head, right deltoid were all normal.  There was no spontaneous activity at right lumbosacral paraspinal muscles   Conclusion: This is an abnormal study, there is electrodiagnostic evidence of myopathic changes involving bilateral lower extremity below knee, most consistent with her history of severe rhabdomyolysis.  The abnormalities are limited to below knee muscles, she also have evidence of mild atrophy, significant myalgia at lower extremity compartment.  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  Hope she can regain significant recovery with physical therapy.    -------------------------------   Marcial Pacas, M.D. PhD  [facility] [address] [place], [place] [address] Tel: [phone] Fax: [phone]  Verbal informed consent was obtained from the patient, patient was informed of potential risk of procedure, including bruising, bleeding, hematoma formation, infection, muscle weakness, muscle pain, numbness, among others.        [place]    Nerve / Sites Muscle Latency Ref. Amplitude Ref. Rel Amp Segments Distance Velocity Ref. Area    ms ms mV mV %  cm m/s m/s mVms  R Ulnar - ADM     Wrist ADM 3.1 ?3.3 8.2 ?6.0 100 Wrist - ADM 7   42.9     B.Elbow ADM 7.0  8.5  104 B.Elbow - Wrist 22 56 ?49 44.1     A.Elbow ADM 8.8  8.7  102 A.Elbow - B.Elbow 10 56 ?49 44.6  R Peroneal - EDB     Ankle EDB 5.9 ?6.5 0.3 ?2.0 100 Ankle - EDB 9   2.2     Fib head EDB 16.3  0.3  81.4 Fib head - Ankle 31 30 ?44 1.9     Pop fossa EDB 19.6  0.3  93.7 Pop fossa - Fib head 10 30 ?44 1.8         Pop fossa - Ankle      L Peroneal - EDB     Ankle EDB NR ?6.5 NR ?2.0 NR Ankle - EDB 9   NR     Fib head EDB NR  NR  NR Fib head - Ankle 31 NR ?44 NR  Pop fossa EDB NR  NR  NR Pop fossa - Fib head 10 NR ?44 NR         Pop fossa - Ankle      R Tibial - AH     Ankle AH 5.8 ?5.8 0.1 ?4.0 100 Ankle - AH 9   1.4     Pop fossa AH 20.3  0.2  142 Pop fossa - Ankle 43 30 ?41 1.1             SNC    Nerve / Sites Rec. Site Peak Lat Ref.  Amp Ref. Segments Distance    ms ms V V  cm  R Sural - Ankle (Calf)     Calf Ankle NR ?4.4 NR ?6 Calf - Ankle 14  R Superficial peroneal - Ankle     Lat leg Ankle NR ?4.4 NR ?6 Lat leg - Ankle 14  L Superficial peroneal - Ankle     Lat leg Ankle NR ?4.4 NR ?6 Lat leg - Ankle 14   R Ulnar - Orthodromic, (Dig V, Mid palm)     Dig V Wrist 3.0 ?3.1 17 ?5 Dig V - Wrist 20             F  Wave    Nerve F Lat Ref.   ms ms  R Tibial - AH NR ?56.0  R Ulnar - ADM 31.0 ?32.0         EMG Summary Table    Spontaneous MUAP Recruitment  Muscle IA Fib PSW Fasc Other Amp Dur. Poly Pattern  R. Tibialis anterior Increased 2+ 2+ None _______ Decreased Normal 2+ Reduced  R. Tibialis posterior Increased 2+ 2+ None _______ Normal Normal 2+ Reduced  R. Peroneus longus Increased 2+ 2+ None _______ Normal Normal 2+ Reduced  R. Gastrocnemius (Medial head) Increased 2+ 2+ None _______ Normal Normal 2+ Reduced  R. Vastus lateralis Normal None None None _______ Normal Normal Normal Normal  R. Biceps femoris (short head) Normal None None None _______ Normal Normal Normal Normal  R. Biceps femoris (long head) Normal None None None _______ Normal Normal Normal Normal  R. Lumbar paraspinals (low) Normal None None None _______ Normal Normal Normal Normal  R. Lumbar paraspinals (mid) Normal None None None _______ Normal Normal Normal Normal  L. Tibialis anterior Increased 1+ 1+ None _______ Normal Normal 1+ Reduced  L. Tibialis posterior Increased 1+ 1+ None _______ Normal Normal 1+ Reduced  L. Peroneus longus Increased 1+ 1+ None _______ Normal Increased 1+ Reduced  L. Gastrocnemius (Medial head) Increased 1+ 1+ None _______ Normal Normal 1+ Reduced  L. Vastus lateralis Normal None None None _______ Normal Normal Normal Normal  R. Biceps brachii Normal None None None _______ Normal Normal Normal Normal

## 2020-08-27 NOTE — Progress Notes (Signed)
ASSESSMENT AND PLAN  Carmen Daniels is a 35 y.o. female   Acute onset of bilateral distal leg weakness, neuropathic pain following her prolonged loss of consciousness, rhabdomyolysis on May 8th 2022  This happened in the setting of few weeks history of decreased intake, concerning about weight gain, abrupt stop of her antidepression medications, also fell due to syncope, stayed on consciousness lying on the floor for 8 hours, then crawled back to bed for 2 days, without eating solid food, only drinking small amount of liquid  Upon hospital presentation, significantly elevated CPK of more than 26,000, liver and kidney failure, quick recovery with hydration, repeat laboratory evaluation in June 2022 showed no significant abnormality  EMG nerve conduction study August 27, 2020 showed myopathic findings mainly involving below knee muscles, with well-preserved motor unit potential, also evidence of mild axonal peripheral neuropathy, above findings most suggestive of rhabdomyolysis involving lower extremity muscles, likely compartment syndrome, compromise of vascular supply, distal nerve damage  Hope she continue to improve with physical therapy, emphasized importance of well nutrition, well hydration    Neuropathic pain  Keep Cymbalta to 60 mg daily, and Trileptal titrating to 150 mg 2 tablets twice a day  Chronic migraine headaches  Botox injection as migraine prevention  Continue Ajovy to 25 mg every 30 days  Imitrex 100 mg as needed   DIAGNOSTIC DATA (LABS, IMAGING, TESTING) - I reviewed patient records, labs, notes, testing and imaging myself where available. MRI on July 23 2020 Mild lower lumbar facet arthrosis with left foraminal annular fissure at L4-5. This could irritate the left L4 nerve root. No foraminal stenosis.   MRI thoracic: Normal thoracic spine.  CT head and cervical on May 8th 2022: No acute intracranial abnormality noted.   CT of the cervical spine: No acute bony  abnormality is noted. Mild degenerative changes are seen as well as findings suggestive of muscular spasm.    CT abdomen on August 06 2020 No acute or inflammatory process identified in the abdomen or pelvis.   Normal appendix.  Surgically absent uterus.  Laboratory: CPK upon admission was 4925, 18 on August 06, 2020, CBC, Wbc 11, Hg 15.7, Hg 13.6, CMP, creat 0.87, TSH 4.3,   HISTORICAL LETTICIA Daniels has of being a patient of our office for many years for chronic migraine headaches, she also has history of bipolar disorder, suicidal attempt in the past, hospital discharge in February 2021 for polypharmacy overdose, status epilepticus   She was admitted to the hospital on March 28, 2019 with intentional overdose, this including Wellbutrin, Ambien, Flexeril, then she was noted to have intermittent whole body shaking, progressed to have seizure-like activity, was intubated on March 29, 2019, was loaded with Versed, Dilantin, she was noted to have 10 to 15 seconds of seizure activity followed by quiescence.  Then she was loaded with IV Keppra, midazolam, followed by propofol drip, which stopped the clinical seizure activity, later continuous EEG showed diffuse continuous slowing, system with severe encephalopathy, she was seen by epileptologist Dr. Hortense Ramal on April 05, 2019, her seizure were likely provoked in the setting of Wellbutrin overdose, most likely she will not require long-term antiemetic medications, continue seizure precaution, gradually wean off Keppra,   Her hospital course was also complicated by aspiration pneumonia, right hand cellulitis, was treated with Levaquin, Flagyl.  There was evidence of prolonged QTC, likely due to Wellbutrin,   MRI of the brain on April 04, 2019 showed no acute abnormality, chronic arachnoid cyst at  the right cranial fossa,   Continuous EEG monitoring showed severe diffuse encephalopathy, continuous generalized slowing,   Laboratory evaluation on  March 28, 2019 showed positive for benzodiazepine, lithium level was 0.11, plasma lactic acid was 4.1,   Most recent laboratory evaluation in February 2021, CBC showed mild anemia hemoglobin of 11.3, BMP showed no significant abnormalities   She previously on polypharmacy for her mood disorder, now only on Zyprexa 50 mg at bedtime   For a while she compalins of short-term memory loss, had no recurrent seizure-like activity, has moved in with her parents, also complains of increased migraine headaches,  She began to receive Botox injection as migraine prevention since March 2017,    Her EEG was normal on May 31, 2019.  She does not have recurrent seizure, she did have status epilepticus during her hospital admission on March 28, 2019 due to polypharmacy overdose, she is not taking Keppra 750 mg twice a day, I have suggested her for total treatment of 6 months, stepping down to Keppra 500 mg for 2 months, then 250 mg for 2 months, then stop,  UPDATE June 28th 2022: She is accompanied by her mother for evaluation of bilateral distal leg weakness, Botox injection as migraine prevention  She stopped all of her psychiatric medications before hospital admission on May 8th, 2022, she was found down at home staying on floor for about 8 hours, she was concerned about her weight gain, restricting her intake to very little fluid, almost no solid food days prior to that.  She was able to crawl to her bathroom, was in bed for 2 days, prior to coming to emergency room, noticed weakness in legs, pain all over, elevated CPK 26,755,  UDS was positive for benzodiazepine, creatinine of 4.52 from baseline 0.8, AST 1362, ALT of 1510,   She was diagnosed with acute renal failure secondary to rhabdomyolysis, and liver injury, with consistent bilateral lower extremity neuropathic pain, distal leg weakness, bilateral foot drop since then, moderate malnutrition, was discharged home to her parents  Since that event,  she continues to have moderate to severe bilateral foot, distal leg pain, stabbing, burning, cannot bear weight, profound weakness, no longer ambulatory,  Also complains of worsening headaches, taking gabapentin 300 3 times daily for fibromyalgia, Percocet 5/325 mg 4 times a day for body achy pain, Imitrex about once a week as needed for headaches, denies bowel and bladder incontinence, denies upper extremity paresthesia or weakness, complains diffuse body achy pain, difficulty sleeping, taking Ambien 10 mg every night, despite that, difficulty staying sleep  UPDATE July 6th 2022:  Returns with her mother for electrodiagnostic study today, which showed evidence of myopathic changes mainly involving muscles below knee, right worse than left, there was also normal morphology and motor unit potential mixed with some complex smaller motor unit potential, with slightly decreased recruitment patterns.  Evidence of mild axonal peripheral neuropathy  Needle examination of more proximal muscles of bilateral lower extremities (above knee muscles) were essentially normal  Above findings support the diagnosis of rhabdomyolysis induced distal lower extremity muscle damage, likely compartment syndrome induced distal nerve damage with compromised blood supply,   Patient also made some progress in physical therapy, less pain, able to take few steps, she has been compliant with Cymbalta 60 mg daily   Repeat laboratory evaluation in June 2022 showed normal CPK, CBC, CMP, TSH,   PHYSICAL EXAM:   There were no vitals filed for this visit.  Not recorded  There is no height or weight on file to calculate BMI.  PHYSICAL EXAMNIATION:  Gen: NAD, conversant, well nourised, well groomed            NEUROLOGICAL EXAM:  MENTAL STATUS: Speech/cognition: Awake, alert, oriented to history and casual conversation   CRANIAL NERVES: CN II: Visual fields are full to confrontation. Pupils are round equal and  briskly reactive to light. CN III, IV, VI: extraocular movement are normal. No ptosis. CN V: Facial sensation is intact to light touch CN VII: Face is symmetric with normal eye closure  CN VIII: Hearing is normal to causal conversation. CN IX, X: Phonation is normal. CN XI: Head turning and shoulder shrug are intact  MOTOR: Bilateral upper extremity muscle tone and strength was normal  Significant tenderness at lower extremity below knee level, with evidence of muscle atrophy  LE Hip Flexion Knee flexion Knee extension Ankle Dorsiflexion Eversion Ankle plantar Flexion Inversion  R 5 5 5 3 3 3 3   L 5 5 5  3+ 3+ 3+ 3+      REFLEXES: Reflexes are 3 and symmetric at the biceps, triceps, knees, and absent at ankles. Plantar responses are flexor bilaterally  SENSORY: Intact to light touch, pinprick and vibratory sensation are intact in fingers and toes.    COORDINATION: There is no trunk or limb dysmetria noted.  GAIT/STANCE: Deferred.  REVIEW OF SYSTEMS:  Full 14 system review of systems performed and notable only for as above All other review of systems were negative.   ALLERGIES: Allergies  Allergen Reactions   Keflex [Cephalexin] Anaphylaxis    HOME MEDICATIONS: Current Outpatient Medications  Medication Sig Dispense Refill   beclomethasone (QVAR) 40 MCG/ACT inhaler Inhale 1 puff into the lungs 2 (two) times daily as needed (asthma).      botulinum toxin Type A (BOTOX) 100 units SOLR injection Inject 200 Units into the muscle every 3 (three) months. Inject into head and neck muscles 2 each 3   clonazePAM (KLONOPIN) 1 MG tablet Take 1 mg by mouth 4 (four) times daily.     DULoxetine (CYMBALTA) 60 MG capsule Take 1 capsule (60 mg total) by mouth daily. 90 capsule 3   Fremanezumab-vfrm (AJOVY) 225 MG/1.5ML SOAJ Inject 225 mg into the skin every 30 (thirty) days. 1.5 mL 11   gabapentin (NEURONTIN) 300 MG capsule Take 1 capsule (300 mg total) by mouth 3 (three) times daily as  needed. 90 capsule 0   Multiple Vitamins-Calcium (ONE-A-DAY WOMENS FORMULA PO) Take 1 tablet by mouth daily.     OXcarbazepine (TRILEPTAL) 150 MG tablet Take 2 tablets (300 mg total) by mouth 2 (two) times daily. 120 tablet 11   oxyCODONE-acetaminophen (PERCOCET/ROXICET) 5-325 MG tablet Take 1 tablet by mouth every 6 (six) hours as needed for severe pain.     SUMAtriptan (IMITREX) 100 MG tablet Take 1 tablet (100 mg total) by mouth every 2 (two) hours as needed for migraine. MAY DOSE 2 PER DAY OR 3 12 tablet 11   zolpidem (AMBIEN) 10 MG tablet Take 10 mg by mouth at bedtime.     No current facility-administered medications for this visit.    PAST MEDICAL HISTORY: Past Medical History:  Diagnosis Date   Acute respiratory failure with hypoxia (HCC)    Acute rhinosinusitis 07/03/2018   Allergic rhinitis    Anxiety    Asthma    Back pain    Bipolar disorder (HCC)    Borderline personality disorder (HCC)    Bupropion overdose  Chronic pain    Chronic pain syndrome    Depression    Fibromyalgia    generalized   Hallucinations    Migraines    Nasal polyps    Nausea    Neck pain    OCD (obsessive compulsive disorder)    OCD (obsessive compulsive disorder)    Ovarian cyst    left   Pneumonia    2021   PONV (postoperative nausea and vomiting)    Schizophrenia ([place])    schizoaffective    PAST SURGICAL HISTORY: Past Surgical History:  Procedure Laterality Date   ABDOMINAL HYSTERECTOMY     LAPAROSCOPIC TUBAL LIGATION Bilateral 06/13/2019   Procedure: LAPAROSCOPIC TUBAL LIGATION;  Surgeon: Chancy Milroy, MD;  Location: [facility];  Service: Gynecology;  Laterality: Bilateral;  FILSHIE CLIPS   NASAL SINUS SURGERY     TUBAL LIGATION     tubes in ears     VAGINAL HYSTERECTOMY Bilateral 01/15/2020   Procedure: HYSTERECTOMY VAGINAL;  Surgeon: Chancy Milroy, MD;  Location: [facility];  Service: Gynecology;  Laterality: Bilateral;   WISDOM TOOTH EXTRACTION       FAMILY HISTORY: Family History  Problem Relation Age of Onset   Healthy Father    Healthy Mother    Arthritis Maternal Grandmother    Arthritis Maternal Grandfather    Depression Paternal Grandmother     SOCIAL HISTORY: Social History   Socioeconomic History   Marital status: Married    Spouse name: Not on file   Number of children: 0   Years of education: 14   Highest education level: Not on file  Occupational History   Occupation: Unemployed  Tobacco Use   Smoking status: Every Day    Packs/day: 0.50    Years: 15.00    Pack years: 7.50    Types: Cigarettes   Smokeless tobacco: Never  Vaping Use   Vaping Use: Every day   Substances: Nicotine  Substance and Sexual Activity   Alcohol use: Never   Drug use: No   Sexual activity: Not on file  Other Topics Concern   Not on file  Social History Narrative   Lives at home alone.   Right-handed.   3 glasses of green tea per day.   Social Determinants of Health   Financial Resource Strain: Not on file  Food Insecurity: Not on file  Transportation Needs: Not on file  Physical Activity: Not on file  Stress: Not on file  Social Connections: Not on file  Intimate Partner Violence: Not on file     Marcial Pacas, M.D. Ph.D.  [facility] [address], [place], [place] [address] Ph: [phone] Fax: [phone]  CC:  [name], [place],  [place] [address]  [name], MD

## 2020-08-27 NOTE — Therapy (Signed)
[place], [place], [address] Phone: [phone]   Fax:  [phone]  Physical Therapy Treatment  Patient Details  Name: Carmen Daniels MRN: [id number] Date of Birth: 1985-03-13 Referring Provider (PT): [name]   Encounter Date: 08/27/2020   PT End of Session - 08/27/20 1311     Visit Number 4    Number of Visits 13    Date for PT Re-Evaluation 09/25/20    Authorization Type  MCD    Authorization Time Period Submitted 08/15/2020 for 3 Initial PT visits from 08/20/2020 - 09/02/2020    Authorization - Visit Number 3    Authorization - Number of Visits 3    PT Start Time 7564    PT Stop Time 1215    PT Time Calculation (min) 60 min    Equipment Utilized During Treatment Gait belt    Activity Tolerance Patient limited by pain;Patient tolerated treatment well;Patient limited by fatigue    Behavior During Therapy [facility] for tasks assessed/performed             Past Medical History:  Diagnosis Date   Acute respiratory failure with hypoxia (HCC)    Acute rhinosinusitis 07/03/2018   Allergic rhinitis    Anxiety    Asthma    Back pain    Bipolar disorder (HCC)    Borderline personality disorder (HCC)    Bupropion overdose    Chronic pain    Chronic pain syndrome    Depression    Fibromyalgia    generalized   Hallucinations    Migraines    Nasal polyps    Nausea    Neck pain    OCD (obsessive compulsive disorder)    OCD (obsessive compulsive disorder)    Ovarian cyst    left   Pneumonia    2021   PONV (postoperative nausea and vomiting)    Schizophrenia ([place])    schizoaffective    Past Surgical History:  Procedure Laterality Date   ABDOMINAL HYSTERECTOMY     LAPAROSCOPIC TUBAL LIGATION Bilateral 06/13/2019   Procedure: LAPAROSCOPIC TUBAL LIGATION;  Surgeon: Chancy Milroy, MD;  Location: [facility];  Service: Gynecology;  Laterality: Bilateral;  FILSHIE CLIPS   NASAL  SINUS SURGERY     TUBAL LIGATION     tubes in ears     VAGINAL HYSTERECTOMY Bilateral 01/15/2020   Procedure: HYSTERECTOMY VAGINAL;  Surgeon: Chancy Milroy, MD;  Location: [facility];  Service: Gynecology;  Laterality: Bilateral;   WISDOM TOOTH EXTRACTION      There were no vitals filed for this visit.   Subjective Assessment - 08/27/20 1117     Subjective Pt reports 9/10 following her EMG study this morning. She states she has had a difficult time getting comfortable in bed due to her pain. Her EMG study reveals diminished sensory and motor function below the knees BIL, results detailed below. She has been unable to regularly perform her standing home exercises due to pain when performing them in a walker. She also reports she hasn't eaten well the past few days due to pain. She requests a referral to nutrition.    Patient is accompained by: Family member   Mother   Limitations Sitting;Lifting;Standing;Walking;Writing;House hold activities    Diagnostic tests 08/27/2020 NCV study with EMG- Results: This is an abnormal study, there is electrodiagnostic evidence of myopathic changes involving bilateral lower extremity below knee, most consistent with her history of severe rhabdomyolysis.  The  abnormalities are limited to below knee muscles, she also have evidence of mild atrophy, significant myalgia at lower extremity compartment.  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    Currently in Pain? Yes    Pain Score 9     Pain Location Foot    Pain Orientation Right;Left    Pain Descriptors / Indicators Stabbing;Spasm;Cramping                               [facility] Adult PT Treatment/Exercise - 08/27/20 0001       Transfers   Transfers Stand Pivot Transfers;Stand to Sit;Sit to Stand    Sit to Stand 4: Min assist   x5 from  [facility] to walker   Stand to Sit 4: Min assist   x5 from walker to [facility]     Ambulation/Gait   Ambulation Distance (Feet) --   3x15 ft in // bars     Knee/Hip Exercises: Stretches   Other Knee/Hip Stretches --   tandem anterior/ posterior weight shifts in walker 2x10 BIL   Other Knee/Hip Stretches Side weight shifts in walker 2x10 BIL                    PT Education - 08/27/20 1308     Education Details Pt instructed to follow up with her PCP regarding a referral to Nutrition. Additional resources regarding a local nutritionist was also provided to the pt. Reinforcement about regular standing exercises was also provided to the pt. Pt also instructed on how to set up her walker to the correct height.    Person(s) Educated Patient;Parent(s)    Methods Explanation;Handout;Demonstration    Comprehension Verbalized understanding;Returned demonstration              PT Short Term Goals - 08/14/20 1809       PT SHORT TERM GOAL #1   Title Pt will demonstrate understanding and report adherence to an HEP to independently address her primary impairments in an effort to promote functional ability.    Baseline HEP given at eval    Time 3    Period Weeks    Status New    Target Date 09/04/20               PT Long Term Goals - 08/14/20 1811       PT LONG TERM GOAL #1   Title Pt will demonstrate global BIL ankle MMT of 4/5 or higher in order to promote walking ability.    Baseline Pt BIL gros ankle MMT 3-/5    Time 6    Period Weeks    Status New    Target Date 09/25/20      PT LONG TERM GOAL #2   Title Pt will demonstrate ability to stand >5 minutes in order to progress to showering and toileting independently.    Baseline Pt unable to stand due to pain.    Time 6    Period Weeks    Status New    Target Date 09/25/20      PT LONG TERM GOAL #3   Title Pt will demonstrate ability to walk at self-selected pace for >5 minutes in order to progress to walking her dogs  without limitation.    Baseline Pt unable to walk.    Time 6  Period Weeks    Status New    Target Date 09/25/20      PT LONG TERM GOAL #4   Title Pt will demonstrate intact hot/cold and course/soft discrimination in BIL feet in order to protect her feet from injury when walking.    Baseline Poor hot/cold and course/soft discrimination in BIL feet    Time 6    Period Weeks    Status New    Target Date 09/25/20      PT LONG TERM GOAL #5   Title Pt will report ability to sit EOB >30 minutes with <5/10 pain in order to sit to watch a movie with minimal limitation.    Baseline Pt experiences 8/10 pain when sitting >10 minutes.    Time 6    Period Weeks    Status New    Target Date 09/25/20                   Plan - 08/27/20 1316     Clinical Impression Statement Pt tolerated treatment well today, demonstrating the ability to take roughly 30 steps with UE support in parallel bars. She demonstrated improved ankle, foot, and toe muscle activation throughout weight-bearing activities compared to last visit, showing improved motor control. She was able to simulate proper ankle gait mechanics such as heel strike and toe off. The pt was able to perform standing weight shifts in a walker. This was done to show the pt proper height of the walker and to get used to performing weight shifts at home. The pt required minimal assistance with sit-to-stand transfers today, demonstrating improved functional ability. The pt will continue to benefit from skilled PT to address her primary impairments and help her return to her pre-injury functional level. She will benefit from aquatic therapy once she has achieved additional functional ability through traditional PT.    Personal Factors and Comorbidities Comorbidity 3+    Comorbidities See medical history above    Examination-Activity Limitations Bathing;Locomotion Level;Transfers;Bed  Mobility;Bend;Sit;Carry;Squat;Dressing;Stairs;Stand;Hygiene/Grooming;Toileting;Lift    Examination-Participation Restrictions Cleaning;Community Activity;Driving;Laundry    Stability/Clinical Decision Making Evolving/Moderate complexity    Clinical Decision Making Moderate    Rehab Potential Good    PT Frequency 2x / week    PT Duration 6 weeks    PT Treatment/Interventions Aquatic Therapy;ADLs/Self Care Home Management;Biofeedback;Cryotherapy;Electrical Stimulation;DME Instruction;Ultrasound;Moist Heat;Gait training;Stair training;Functional mobility training;Therapeutic activities;Therapeutic exercise;Balance training;Neuromuscular re-education;Manual techniques;Patient/family education;Taping;Passive range of motion;Dry needling;Spinal Manipulations    PT Next Visit Plan progress standing weight shifts/ walking as able; target lower leg musculature in weight-bearing positions as able, follow up about nutrition    PT Home Exercise Plan [id number]    Consulted and Agree with Plan of Care Patient             Patient will benefit from skilled therapeutic intervention in order to improve the following deficits and impairments:  Abnormal gait, Decreased activity tolerance, Decreased balance, Decreased mobility, Decreased strength, Impaired sensation, Postural dysfunction, Improper body mechanics, Pain, Impaired UE functional use, Difficulty walking, Decreased endurance  Visit Diagnosis: Other disturbances of skin sensation  Pain in left foot  Pain in right foot  Muscle weakness (generalized)  Unsteadiness on feet     Problem List Patient Active Problem List   Diagnosis Date Noted   Weakness 08/19/2020   Gait abnormality 08/19/2020   Neuropathic pain 08/19/2020   Malnutrition of moderate degree 07/01/2020   Acute renal failure ([place]) 06/30/2020   Non-traumatic rhabdomyolysis    Elevated LFTs    Tardive  dyskinesia 11/14/2019   Bipolar I disorder, most recent episode depressed  ([place]) 09/13/2019   Social anxiety disorder 09/13/2019   Adjustment disorder with mixed anxiety and depressed mood 07/31/2019   Suicidal ideation    Post-operative state 07/19/2019   Seizures ([place]) 06/08/2019   Bipolar 1 disorder, depressed ([place]) 05/17/2019   Status epilepticus ([place]) 05/17/2019   Intentional benzodiazepine overdose ([place]) 03/29/2019   Intractable chronic 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 status migrainosus 02/08/2019   History of elective abortion 12/28/2018   QT prolongation 07/03/2018   AMS (altered mental status) 07/03/2018   Serum total bilirubin elevated 07/03/2018   Syncope 07/02/2018   Borderline personality disorder ([place]) 04/11/2017   Bipolar I disorder, current or most recent episode manic, with psychotic features ([place]) 04/10/2017   Bipolar disorder ([place]) 04/10/2017   Abnormal MRI of head 04/21/2016   Chronic migraine without aura 03/27/2015   Mild persistent asthma 12/30/2014   Allergic rhinitis due to pollen 12/30/2014   Rhinitis medicamentosa 12/30/2014   Nasal polyposis 12/30/2014    Vanessa [place], PT, DPT 08/27/20 1:21 PM   [facility] [address] [place], [place], [address] Phone: [phone]   Fax:  [phone]  Name: Carmen Daniels MRN: [id number] Date of Birth: Oct 30, 1985

## 2020-08-29 ENCOUNTER — Ambulatory Visit: Payer: Medicaid Other

## 2020-08-29 ENCOUNTER — Other Ambulatory Visit: Payer: Self-pay

## 2020-08-29 DIAGNOSIS — M79671 Pain in right foot: Secondary | ICD-10-CM

## 2020-08-29 DIAGNOSIS — M6281 Muscle weakness (generalized): Secondary | ICD-10-CM

## 2020-08-29 DIAGNOSIS — R208 Other disturbances of skin sensation: Secondary | ICD-10-CM | POA: Diagnosis not present

## 2020-08-29 DIAGNOSIS — R2681 Unsteadiness on feet: Secondary | ICD-10-CM

## 2020-08-29 DIAGNOSIS — M79672 Pain in left foot: Secondary | ICD-10-CM

## 2020-08-29 NOTE — Patient Instructions (Signed)
  Word documented provided to pt due to [place] not working.

## 2020-08-29 NOTE — Therapy (Signed)
[place], [place], [address] Phone: [phone]   Fax:  [phone]  Physical Therapy Treatment  Patient Details  Name: Carmen Daniels MRN: [id number] Date of Birth: 31-Oct-1985 Referring Provider (PT): [name]   Encounter Date: 08/29/2020   PT End of Session - 08/29/20 1022     Visit Number 5    Number of Visits 13    Date for PT Re-Evaluation 09/25/20    Authorization Type [place] MCD    Authorization Time Period CCME submitted 08/28/2020 for 12 PT visits from 08/29/2020 - 10/09/2020    Authorization - Visit Number 4    Authorization - Number of Visits 12    PT Start Time 0930    PT Stop Time 1015    PT Time Calculation (min) 45 min    Equipment Utilized During Treatment Gait belt    Activity Tolerance Patient limited by pain;Patient tolerated treatment well;Patient limited by fatigue    Behavior During Therapy [facility] for tasks assessed/performed             Past Medical History:  Diagnosis Date   Acute respiratory failure with hypoxia (HCC)    Acute rhinosinusitis 07/03/2018   Allergic rhinitis    Anxiety    Asthma    Back pain    Bipolar disorder (HCC)    Borderline personality disorder (HCC)    Bupropion overdose    Chronic pain    Chronic pain syndrome    Depression    Fibromyalgia    generalized   Hallucinations    Migraines    Nasal polyps    Nausea    Neck pain    OCD (obsessive compulsive disorder)    OCD (obsessive compulsive disorder)    Ovarian cyst    left   Pneumonia    2021   PONV (postoperative nausea and vomiting)    Schizophrenia ([place])    schizoaffective    Past Surgical History:  Procedure Laterality Date   ABDOMINAL HYSTERECTOMY     LAPAROSCOPIC TUBAL LIGATION Bilateral 06/13/2019   Procedure: LAPAROSCOPIC TUBAL LIGATION;  Surgeon: Chancy Milroy, MD;  Location: [place];  Service: Gynecology;  Laterality: Bilateral;  FILSHIE CLIPS   NASAL SINUS  SURGERY     TUBAL LIGATION     tubes in ears     VAGINAL HYSTERECTOMY Bilateral 01/15/2020   Procedure: HYSTERECTOMY VAGINAL;  Surgeon: Chancy Milroy, MD;  Location: [place];  Service: Gynecology;  Laterality: Bilateral;   WISDOM TOOTH EXTRACTION      There were no vitals filed for this visit.   Subjective Assessment - 08/29/20 0944     Subjective Pt reports decreased foot pain today (6/10). She adds that she has been adherent only with her non-weight-bearing exercises due to not knowing what settings to use for her walker. She brings her walker in today to be adjusted. She adds that she followed up with her PCM about a nutrition referral, which has been placed for the pt.    Patient is accompained by: Family member    Currently in Pain? Yes    Pain Score 6     Pain Location Ankle    Pain Orientation Right;Left                [facility] PT Assessment - 08/29/20 0001       Transfers   Transfers Stand to Sit;Sit to Stand    Sit to Stand 4: Min  guard    Stand to Sit 4: Min guard      Ambulation/Gait   Ambulation Distance (Feet) 75 Feet                           [facility] Adult PT Treatment/Exercise - 08/29/20 0001       Knee/Hip Exercises: Standing   Other Standing Knee Exercises Standing lateral weight shifts in FWW 2x10 BIL    Other Standing Knee Exercises Tandem stance forward/ backward weight shifts 2x10 BIL      Ankle Exercises: Seated   Other Seated Ankle Exercises Handhold resistance heel raises 2x10    Other Seated Ankle Exercises Resisted toe raises with RTB 2x10 BIL                    PT Education - 08/29/20 1022     Education Details Pt educated about importance of regular weight-bearing exercises, as well as proper form when performing new exercises.    Person(s) Educated Patient    Methods Explanation;Handout;Demonstration;Tactile cues;Verbal cues    Comprehension Verbal cues required;Tactile cues required;Returned  demonstration;Verbalized understanding              PT Short Term Goals - 08/14/20 1809       PT SHORT TERM GOAL #1   Title Pt will demonstrate understanding and report adherence to an HEP to independently address her primary impairments in an effort to promote functional ability.    Baseline HEP given at eval    Time 3    Period Weeks    Status New    Target Date 09/04/20               PT Long Term Goals - 08/14/20 1811       PT LONG TERM GOAL #1   Title Pt will demonstrate global BIL ankle MMT of 4/5 or higher in order to promote walking ability.    Baseline Pt BIL gros ankle MMT 3-/5    Time 6    Period Weeks    Status New    Target Date 09/25/20      PT LONG TERM GOAL #2   Title Pt will demonstrate ability to stand >5 minutes in order to progress to showering and toileting independently.    Baseline Pt unable to stand due to pain.    Time 6    Period Weeks    Status New    Target Date 09/25/20      PT LONG TERM GOAL #3   Title Pt will demonstrate ability to walk at self-selected pace for >5 minutes in order to progress to walking her dogs without limitation.    Baseline Pt unable to walk.    Time 6    Period Weeks    Status New    Target Date 09/25/20      PT LONG TERM GOAL #4   Title Pt will demonstrate intact hot/cold and course/soft discrimination in BIL feet in order to protect her feet from injury when walking.    Baseline Poor hot/cold and course/soft discrimination in BIL feet    Time 6    Period Weeks    Status New    Target Date 09/25/20      PT LONG TERM GOAL #5   Title Pt will report ability to sit EOB >30 minutes with <5/10 pain in order to sit to watch a movie with minimal limitation.  Baseline Pt experiences 8/10 pain when sitting >10 minutes.    Time 6    Period Weeks    Status New    Target Date 09/25/20                   Plan - 08/29/20 1023     Clinical Impression Statement Pt responded well to all activity with  minimal increase in pain and intermittent dizziness during standing exercises that was improved with seated rests. She will continue to benefit from skilled PT to address her primary impairments and return to her prior level of function without limitation.    Personal Factors and Comorbidities Comorbidity 3+    Comorbidities See medical history above    Examination-Activity Limitations Bathing;Locomotion Level;Transfers;Bed Mobility;Bend;Sit;Carry;Squat;Dressing;Stairs;Stand;Hygiene/Grooming;Toileting;Lift    Examination-Participation Restrictions Cleaning;Community Activity;Driving;Laundry    Stability/Clinical Decision Making Evolving/Moderate complexity    Clinical Decision Making Moderate    Rehab Potential Good    PT Frequency 2x / week    PT Duration 6 weeks    PT Treatment/Interventions Aquatic Therapy;ADLs/Self Care Home Management;Biofeedback;Cryotherapy;Electrical Stimulation;DME Instruction;Ultrasound;Moist Heat;Gait training;Stair training;Functional mobility training;Therapeutic activities;Therapeutic exercise;Balance training;Neuromuscular re-education;Manual techniques;Patient/family education;Taping;Passive range of motion;Dry needling;Spinal Manipulations    PT Next Visit Plan progress standing weight shifts/ walking as able; target lower leg musculature in weight-bearing positions as able    PT Home Exercise Plan [id number]    Consulted and Agree with Plan of Care Patient             Patient will benefit from skilled therapeutic intervention in order to improve the following deficits and impairments:  Abnormal gait, Decreased activity tolerance, Decreased balance, Decreased mobility, Decreased strength, Impaired sensation, Postural dysfunction, Improper body mechanics, Pain, Impaired UE functional use, Difficulty walking, Decreased endurance  Visit Diagnosis: Other disturbances of skin sensation  Pain in left foot  Pain in right foot  Muscle weakness  (generalized)  Unsteadiness on feet     Problem List Patient Active Problem List   Diagnosis Date Noted   Weakness 08/19/2020   Gait abnormality 08/19/2020   Neuropathic pain 08/19/2020   Malnutrition of moderate degree 07/01/2020   Acute renal failure ([place]) 06/30/2020   Non-traumatic rhabdomyolysis    Elevated LFTs    Tardive dyskinesia 11/14/2019   Bipolar I disorder, most recent episode depressed ([place]) 09/13/2019   Social anxiety disorder 09/13/2019   Adjustment disorder with mixed anxiety and depressed mood 07/31/2019   Suicidal ideation    Post-operative state 07/19/2019   Seizures ([place]) 06/08/2019   Bipolar 1 disorder, depressed ([place]) 05/17/2019   Status epilepticus ([place]) 05/17/2019   Intentional benzodiazepine overdose ([place]) 03/29/2019   Intractable chronic 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 status migrainosus 02/08/2019   History of elective abortion 12/28/2018   QT prolongation 07/03/2018   AMS (altered mental status) 07/03/2018   Serum total bilirubin elevated 07/03/2018   Syncope 07/02/2018   Borderline personality disorder ([place]) 04/11/2017   Bipolar I disorder, current or most recent episode manic, with psychotic features ([place]) 04/10/2017   Bipolar disorder ([place]) 04/10/2017   Abnormal MRI of head 04/21/2016   Chronic migraine without aura 03/27/2015   Mild persistent asthma 12/30/2014   Allergic rhinitis due to pollen 12/30/2014   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    

## 2020-09-01 ENCOUNTER — Ambulatory Visit: Payer: Medicaid Other

## 2020-09-03 ENCOUNTER — Ambulatory Visit: Payer: Medicaid Other

## 2020-09-03 ENCOUNTER — Other Ambulatory Visit: Payer: Self-pay

## 2020-09-03 DIAGNOSIS — M79671 Pain in right foot: Secondary | ICD-10-CM

## 2020-09-03 DIAGNOSIS — M6281 Muscle weakness (generalized): Secondary | ICD-10-CM

## 2020-09-03 DIAGNOSIS — R2681 Unsteadiness on feet: Secondary | ICD-10-CM

## 2020-09-03 DIAGNOSIS — R208 Other disturbances of skin sensation: Secondary | ICD-10-CM | POA: Diagnosis not present

## 2020-09-03 DIAGNOSIS — M79672 Pain in left foot: Secondary | ICD-10-CM

## 2020-09-03 NOTE — Therapy (Signed)
[place], [place], [address] Phone: [phone]   Fax:  [phone]  Physical Therapy Treatment  Patient Details  Name: Carmen Daniels MRN: [id number] Date of Birth: 1985-10-13 Referring Provider (PT): [name]   Encounter Date: 09/03/2020   PT End of Session - 09/03/20 0905     Visit Number 6    Number of Visits 13    Date for PT Re-Evaluation 09/25/20    Authorization Type [place] MCD    Authorization Time Period CCME Approved 12 PT visits from 09/03/2020 - 10/14/2020    Authorization - Visit Number 1    Authorization - Number of Visits 12    PT Start Time 0910    PT Stop Time 1000    PT Time Calculation (min) 50 min    Equipment Utilized During Treatment Gait belt    Activity Tolerance Patient limited by pain;Patient tolerated treatment well;Patient limited by fatigue    Behavior During Therapy [facility] for tasks assessed/performed             Past Medical History:  Diagnosis Date   Acute respiratory failure with hypoxia (HCC)    Acute rhinosinusitis 07/03/2018   Allergic rhinitis    Anxiety    Asthma    Back pain    Bipolar disorder (HCC)    Borderline personality disorder (HCC)    Bupropion overdose    Chronic pain    Chronic pain syndrome    Depression    Fibromyalgia    generalized   Hallucinations    Migraines    Nasal polyps    Nausea    Neck pain    OCD (obsessive compulsive disorder)    OCD (obsessive compulsive disorder)    Ovarian cyst    left   Pneumonia    2021   PONV (postoperative nausea and vomiting)    Schizophrenia ([place])    schizoaffective    Past Surgical History:  Procedure Laterality Date   ABDOMINAL HYSTERECTOMY     LAPAROSCOPIC TUBAL LIGATION Bilateral 06/13/2019   Procedure: LAPAROSCOPIC TUBAL LIGATION;  Surgeon: Chancy Milroy, MD;  Location: [place];  Service: Gynecology;  Laterality: Bilateral;  FILSHIE CLIPS   NASAL SINUS SURGERY      TUBAL LIGATION     tubes in ears     VAGINAL HYSTERECTOMY Bilateral 01/15/2020   Procedure: HYSTERECTOMY VAGINAL;  Surgeon: Chancy Milroy, MD;  Location: [place];  Service: Gynecology;  Laterality: Bilateral;   WISDOM TOOTH EXTRACTION      There were no vitals filed for this visit.   Subjective Assessment - 09/03/20 0906     Subjective Pt presents to PT with continued reports of bil ankle/foot pain. She had a mirgraine the previous session day and had to cancel that appt d/t nausea. Pt has been fairly compliant with HEP. She is ready to begin PT treatment at this time    Currently in Pain? Yes    Pain Score 7     Pain Location Foot    Pain Orientation Right;Left                               [facility] Adult PT Treatment/Exercise - 09/03/20 0001       Self-Care   Self-Care Other Self-Care Comments    Other Self-Care Comments  educated pt and pt's mother on sleeping in boots 3x/wk for low load  long duration stretch to bilat calf, reducing PF force      Knee/Hip Exercises: Stretches   Other Knee/Hip Stretches manual stretching to bilateral calves 2x30 sec ea      Knee/Hip Exercises: Standing   Other Standing Knee Exercises Standing lateral weight shifts in // 2x10 BIL    Other Standing Knee Exercises amb in // bil UE support x 2 laps      Knee/Hip Exercises: Seated   Sit to Sand 5 reps;with UE support   one UE suport     Ankle Exercises: Seated   Other Seated Ankle Exercises seated heel raises x 20    Other Seated Ankle Exercises Resisted toe raises with RTB 2x10 BIL                      PT Short Term Goals - 09/03/20 1150       PT SHORT TERM GOAL #1   Title Pt will demonstrate understanding and report adherence to an HEP to independently address her primary impairments in an effort to promote functional ability.    Baseline HEP given at eval    Time 3    Period Weeks    Status Achieved    Target Date 09/04/20               PT Long  Term Goals - 08/14/20 1811       PT LONG TERM GOAL #1   Title Pt will demonstrate global BIL ankle MMT of 4/5 or higher in order to promote walking ability.    Baseline Pt BIL gros ankle MMT 3-/5    Time 6    Period Weeks    Status New    Target Date 09/25/20      PT LONG TERM GOAL #2   Title Pt will demonstrate ability to stand >5 minutes in order to progress to showering and toileting independently.    Baseline Pt unable to stand due to pain.    Time 6    Period Weeks    Status New    Target Date 09/25/20      PT LONG TERM GOAL #3   Title Pt will demonstrate ability to walk at self-selected pace for >5 minutes in order to progress to walking her dogs without limitation.    Baseline Pt unable to walk.    Time 6    Period Weeks    Status New    Target Date 09/25/20      PT LONG TERM GOAL #4   Title Pt will demonstrate intact hot/cold and course/soft discrimination in BIL feet in order to protect her feet from injury when walking.    Baseline Poor hot/cold and course/soft discrimination in BIL feet    Time 6    Period Weeks    Status New    Target Date 09/25/20      PT LONG TERM GOAL #5   Title Pt will report ability to sit EOB >30 minutes with <5/10 pain in order to sit to watch a movie with minimal limitation.    Baseline Pt experiences 8/10 pain when sitting >10 minutes.    Time 6    Period Weeks    Status New    Target Date 09/25/20                   Plan - 09/03/20 1150     Clinical Impression Statement Pt was able to complete prescribed exercises with no  adverse effect or increase in pain. She was able to progress standing activity today as well as reducing required UE support during sit>stand transition. She continues to benefit from progressive loading to bilat LEs and should continue to progress standing activity at home and in clinic.    PT Treatment/Interventions Aquatic Therapy;ADLs/Self Care Home Management;Biofeedback;Cryotherapy;Electrical  Stimulation;DME Instruction;Ultrasound;Moist Heat;Gait training;Stair training;Functional mobility training;Therapeutic activities;Therapeutic exercise;Balance training;Neuromuscular re-education;Manual techniques;Patient/family education;Taping;Passive range of motion;Dry needling;Spinal Manipulations    PT Next Visit Plan progress standing weight shifts/ walking as able; target lower leg musculature in weight-bearing positions as able    PT Home Exercise Plan [id number]             Patient will benefit from skilled therapeutic intervention in order to improve the following deficits and impairments:  Abnormal gait, Decreased activity tolerance, Decreased balance, Decreased mobility, Decreased strength, Impaired sensation, Postural dysfunction, Improper body mechanics, Pain, Impaired UE functional use, Difficulty walking, Decreased endurance  Visit Diagnosis: Other disturbances of skin sensation  Pain in left foot  Pain in right foot  Muscle weakness (generalized)  Unsteadiness on feet     Problem List Patient Active Problem List   Diagnosis Date Noted   Weakness 08/19/2020   Gait abnormality 08/19/2020   Neuropathic pain 08/19/2020   Malnutrition of moderate degree 07/01/2020   Acute renal failure ([place]) 06/30/2020   Non-traumatic rhabdomyolysis    Elevated LFTs    Tardive dyskinesia 11/14/2019   Bipolar I disorder, most recent episode depressed ([place]) 09/13/2019   Social anxiety disorder 09/13/2019   Adjustment disorder with mixed anxiety and depressed mood 07/31/2019   Suicidal ideation    Post-operative state 07/19/2019   Seizures ([place]) 06/08/2019   Bipolar 1 disorder, depressed ([place]) 05/17/2019   Status epilepticus ([place]) 05/17/2019   Intentional benzodiazepine overdose ([place]) 03/29/2019   Intractable chronic 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 status migrainosus 02/08/2019   History of elective abortion 12/28/2018   QT prolongation 07/03/2018   AMS (altered mental  status) 07/03/2018   Serum total bilirubin elevated 07/03/2018   Syncope 07/02/2018   Borderline personality disorder ([place]) 04/11/2017   Bipolar I disorder, current or most recent episode manic, with psychotic features ([place]) 04/10/2017   Bipolar disorder ([place]) 04/10/2017   Abnormal MRI of head 04/21/2016   Chronic migraine without aura 03/27/2015   Mild persistent asthma 12/30/2014   Allergic rhinitis due to pollen 12/30/2014   Rhinitis medicamentosa 12/30/2014   Nasal polyposis 12/30/2014    Ward Chatters, PT, DPT 09/03/20 11:55 AM  [facility] [address] [place], [place], [address] Phone: [phone]   Fax:  [phone]  Name: Carmen Daniels MRN: [id number] Date of Birth: 29-Sep-1985

## 2020-09-05 ENCOUNTER — Other Ambulatory Visit: Payer: Self-pay

## 2020-09-05 ENCOUNTER — Ambulatory Visit: Payer: Medicaid Other

## 2020-09-05 DIAGNOSIS — M79672 Pain in left foot: Secondary | ICD-10-CM

## 2020-09-05 DIAGNOSIS — M6281 Muscle weakness (generalized): Secondary | ICD-10-CM

## 2020-09-05 DIAGNOSIS — M79671 Pain in right foot: Secondary | ICD-10-CM

## 2020-09-05 DIAGNOSIS — R2681 Unsteadiness on feet: Secondary | ICD-10-CM

## 2020-09-05 DIAGNOSIS — R208 Other disturbances of skin sensation: Secondary | ICD-10-CM | POA: Diagnosis not present

## 2020-09-05 NOTE — Therapy (Signed)
[place], [place], [address] Phone: [phone]   Fax:  [phone]  Physical Therapy Treatment  Patient Details  Name: Carmen Daniels MRN: [id number] Date of Birth: 29-Jan-1986 Referring Provider (PT): [name]   Encounter Date: 09/05/2020   PT End of Session - 09/05/20 1309     Visit Number 7    Number of Visits 13    Date for PT Re-Evaluation 09/25/20    Authorization Type [place] [facility]    Authorization Time Period CCME Approved 12 PT visits from 09/03/2020 - 10/14/2020    Authorization - Visit Number 2    Authorization - Number of Visits 12    PT Start Time 2637    PT Stop Time 1300    PT Time Calculation (min) 45 min    Equipment Utilized During Treatment Gait belt    Activity Tolerance Patient limited by pain;Patient tolerated treatment well;Patient limited by fatigue    Behavior During Therapy [facility] for tasks assessed/performed             Past Medical History:  Diagnosis Date   Acute respiratory failure with hypoxia (HCC)    Acute rhinosinusitis 07/03/2018   Allergic rhinitis    Anxiety    Asthma    Back pain    Bipolar disorder (HCC)    Borderline personality disorder (HCC)    Bupropion overdose    Chronic pain    Chronic pain syndrome    Depression    Fibromyalgia    generalized   Hallucinations    Migraines    Nasal polyps    Nausea    Neck pain    OCD (obsessive compulsive disorder)    OCD (obsessive compulsive disorder)    Ovarian cyst    left   Pneumonia    2021   PONV (postoperative nausea and vomiting)    Schizophrenia ([place])    schizoaffective    Past Surgical History:  Procedure Laterality Date   ABDOMINAL HYSTERECTOMY     LAPAROSCOPIC TUBAL LIGATION Bilateral 06/13/2019   Procedure: LAPAROSCOPIC TUBAL LIGATION;  Surgeon: Chancy Milroy, MD;  Location: [place];  Service: Gynecology;  Laterality: Bilateral;  FILSHIE CLIPS   NASAL SINUS SURGERY      TUBAL LIGATION     tubes in ears     VAGINAL HYSTERECTOMY Bilateral 01/15/2020   Procedure: HYSTERECTOMY VAGINAL;  Surgeon: Chancy Milroy, MD;  Location: [place];  Service: Gynecology;  Laterality: Bilateral;   WISDOM TOOTH EXTRACTION      There were no vitals filed for this visit.   Subjective Assessment - 09/05/20 1221     Subjective Pt reports continued improvement in symptoms since starting PT. She adds that she is still waiting to hear back from nutrition services. She has not regularly performed her standing exercises due to feeling "confined" by the walker.    Patient is accompained by: Family member   Mother   Currently in Pain? Yes    Pain Score 8     Pain Location Foot    Pain Orientation Right;Left                               [facility] Adult PT Treatment/Exercise - 09/05/20 0001       Transfers   Transfers Stand to Sit;Sit to Stand   x5   Sit to Stand 4: Min guard  Stand to Sit 4: Min guard      Ambulation/Gait   Ambulation Distance (Feet) 75 Feet      Knee/Hip Exercises: Standing   Other Standing Knee Exercises Forward/ backwards/lateral/ medial weight shifts on Airex pad in // bars      Ankle Exercises: Seated   Other Seated Ankle Exercises Lateral and medial towel scrunches 2x10 BIL                    PT Education - 09/05/20 1308     Education Details Pt educated about the potential timeline of care and expected progress to be made. Also educated on proper form when performing exercises.    Person(s) Educated Patient;Parent(s)    Methods Explanation;Demonstration;Handout    Comprehension Returned demonstration;Verbalized understanding              PT Short Term Goals - 09/03/20 1150       PT SHORT TERM GOAL #1   Title Pt will demonstrate understanding and report adherence to an HEP to independently address her primary impairments in an effort to promote functional ability.    Baseline HEP given at eval    Time 3     Period Weeks    Status Achieved    Target Date 09/04/20               PT Long Term Goals - 08/14/20 1811       PT LONG TERM GOAL #1   Title Pt will demonstrate global BIL ankle MMT of 4/5 or higher in order to promote walking ability.    Baseline Pt BIL gros ankle MMT 3-/5    Time 6    Period Weeks    Status New    Target Date 09/25/20      PT LONG TERM GOAL #2   Title Pt will demonstrate ability to stand >5 minutes in order to progress to showering and toileting independently.    Baseline Pt unable to stand due to pain.    Time 6    Period Weeks    Status New    Target Date 09/25/20      PT LONG TERM GOAL #3   Title Pt will demonstrate ability to walk at self-selected pace for >5 minutes in order to progress to walking her dogs without limitation.    Baseline Pt unable to walk.    Time 6    Period Weeks    Status New    Target Date 09/25/20      PT LONG TERM GOAL #4   Title Pt will demonstrate intact hot/cold and course/soft discrimination in BIL feet in order to protect her feet from injury when walking.    Baseline Poor hot/cold and course/soft discrimination in BIL feet    Time 6    Period Weeks    Status New    Target Date 09/25/20      PT LONG TERM GOAL #5   Title Pt will report ability to sit EOB >30 minutes with <5/10 pain in order to sit to watch a movie with minimal limitation.    Baseline Pt experiences 8/10 pain when sitting >10 minutes.    Time 6    Period Weeks    Status New    Target Date 09/25/20                   Plan - 09/05/20 1310     Clinical Impression Statement Pt responded well to  all interventions today, although she was limited by pain and fatigue with all standing exercises. She became emotional during the session and expressed that she was feeling discouraged about not being able to perform all her exercises at home recently. She was offered motivation by the PT to remain engaged and purposeful in her rehab program for  positive benefits from care. She became more positive following the discussion and was motivated to complete the session. She will continue to benefit from skilled PT to address her primary impairments and return to her functional baseline without limitation.    Personal Factors and Comorbidities Comorbidity 3+    Comorbidities See medical history above    Examination-Activity Limitations Bathing;Locomotion Level;Transfers;Bed Mobility;Bend;Sit;Carry;Squat;Dressing;Stairs;Stand;Hygiene/Grooming;Toileting;Lift    Examination-Participation Restrictions Cleaning;Community Activity;Driving;Laundry    Stability/Clinical Decision Making Evolving/Moderate complexity    Clinical Decision Making Moderate    Rehab Potential Good    PT Frequency 2x / week    PT Duration 6 weeks    PT Treatment/Interventions Aquatic Therapy;ADLs/Self Care Home Management;Biofeedback;Cryotherapy;Electrical Stimulation;DME Instruction;Ultrasound;Moist Heat;Gait training;Stair training;Functional mobility training;Therapeutic activities;Therapeutic exercise;Balance training;Neuromuscular re-education;Manual techniques;Patient/family education;Taping;Passive range of motion;Dry needling;Spinal Manipulations    PT Next Visit Plan progress standing weight shifts/ walking as able; target lower leg musculature in weight-bearing positions as able    PT Home Exercise Plan [id number]    Consulted and Agree with Plan of Care Patient             Patient will benefit from skilled therapeutic intervention in order to improve the following deficits and impairments:  Abnormal gait, Decreased activity tolerance, Decreased balance, Decreased mobility, Decreased strength, Impaired sensation, Postural dysfunction, Improper body mechanics, Pain, Impaired UE functional use, Difficulty walking, Decreased endurance  Visit Diagnosis: Other disturbances of skin sensation  Pain in left foot  Pain in right foot  Muscle weakness  (generalized)  Unsteadiness on feet     Problem List Patient Active Problem List   Diagnosis Date Noted   Weakness 08/19/2020   Gait abnormality 08/19/2020   Neuropathic pain 08/19/2020   Malnutrition of moderate degree 07/01/2020   Acute renal failure ([place]) 06/30/2020   Non-traumatic rhabdomyolysis    Elevated LFTs    Tardive dyskinesia 11/14/2019   Bipolar I disorder, most recent episode depressed ([place]) 09/13/2019   Social anxiety disorder 09/13/2019   Adjustment disorder with mixed anxiety and depressed mood 07/31/2019   Suicidal ideation    Post-operative state 07/19/2019   Seizures ([place]) 06/08/2019   Bipolar 1 disorder, depressed ([place]) 05/17/2019   Status epilepticus ([place]) 05/17/2019   Intentional benzodiazepine overdose ([place]) 03/29/2019   Intractable chronic 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 status migrainosus 02/08/2019   History of elective abortion 12/28/2018   QT prolongation 07/03/2018   AMS (altered mental status) 07/03/2018   Serum total bilirubin elevated 07/03/2018   Syncope 07/02/2018   Borderline personality disorder ([place]) 04/11/2017   Bipolar I disorder, current or most recent episode manic, with psychotic features ([place]) 04/10/2017   Bipolar disorder ([place]) 04/10/2017   Abnormal MRI of head 04/21/2016   Chronic migraine without aura 03/27/2015   Mild persistent asthma 12/30/2014   Allergic rhinitis due to pollen 12/30/2014   Rhinitis medicamentosa 12/30/2014   Nasal polyposis 12/30/2014    Vanessa [place], PT, DPT 09/05/20 1:14 PM   [facility] [address] [place], [place], [address] Phone: [phone]   Fax:  [phone]  Name: Carmen Daniels MRN: [id number] Date of Birth: 1985-05-17

## 2020-09-05 NOTE — Patient Instructions (Signed)
  [id number]

## 2020-09-08 ENCOUNTER — Other Ambulatory Visit: Payer: Self-pay

## 2020-09-08 ENCOUNTER — Ambulatory Visit: Payer: Medicaid Other

## 2020-09-08 DIAGNOSIS — M6281 Muscle weakness (generalized): Secondary | ICD-10-CM

## 2020-09-08 DIAGNOSIS — M79671 Pain in right foot: Secondary | ICD-10-CM

## 2020-09-08 DIAGNOSIS — M79672 Pain in left foot: Secondary | ICD-10-CM

## 2020-09-08 DIAGNOSIS — R2681 Unsteadiness on feet: Secondary | ICD-10-CM

## 2020-09-08 DIAGNOSIS — R208 Other disturbances of skin sensation: Secondary | ICD-10-CM | POA: Diagnosis not present

## 2020-09-08 NOTE — Therapy (Signed)
[place], [place], [address] Phone: [phone]   Fax:  [phone]  Physical Therapy Treatment  Patient Details  Name: Carmen Daniels MRN: [id number] Date of Birth: [birth year] Referring Provider (PT): [name]   Encounter Date: 09/08/2020   PT End of Session - 09/08/20 1156     Visit Number 8    Number of Visits 13    Date for PT Re-Evaluation 09/25/20    Authorization Type [place] MCD    Authorization Time Period CCME Approved 12 PT visits from 09/03/2020 - 10/14/2020    Authorization - Visit Number 3    Authorization - Number of Visits 12    PT Start Time 0915    PT Stop Time 1000    PT Time Calculation (min) 45 min    Equipment Utilized During Treatment Gait belt    Activity Tolerance Patient limited by pain;Patient tolerated treatment well;Patient limited by fatigue    Behavior During Therapy [facility] for tasks assessed/performed             Past Medical History:  Diagnosis Date   Acute respiratory failure with hypoxia (HCC)    Acute rhinosinusitis 07/03/2018   Allergic rhinitis    Anxiety    Asthma    Back pain    Bipolar disorder (HCC)    Borderline personality disorder (HCC)    Bupropion overdose    Chronic pain    Chronic pain syndrome    Depression    Fibromyalgia    generalized   Hallucinations    Migraines    Nasal polyps    Nausea    Neck pain    OCD (obsessive compulsive disorder)    OCD (obsessive compulsive disorder)    Ovarian cyst    left   Pneumonia    2021   PONV (postoperative nausea and vomiting)    Schizophrenia ([place])    schizoaffective    Past Surgical History:  Procedure Laterality Date   ABDOMINAL HYSTERECTOMY     LAPAROSCOPIC TUBAL LIGATION Bilateral 06/13/2019   Procedure: LAPAROSCOPIC TUBAL LIGATION;  Surgeon: Chancy Milroy, MD;  Location: [place];  Service: Gynecology;  Laterality: Bilateral;  FILSHIE CLIPS   NASAL SINUS SURGERY      TUBAL LIGATION     tubes in ears     VAGINAL HYSTERECTOMY Bilateral 01/15/2020   Procedure: HYSTERECTOMY VAGINAL;  Surgeon: Chancy Milroy, MD;  Location: [place];  Service: Gynecology;  Laterality: Bilateral;   WISDOM TOOTH EXTRACTION      There were no vitals filed for this visit.   Subjective Assessment - 09/08/20 1148     Subjective Pt reports 10/10 pain lasting since her last PT visit; she attributes the pain to the standing Airex exercises. She reports she has not been able to do any of her HEP since the last visit. She reports being ready to initiate treatment at this time.    Patient is accompained by: Family member   Mother   Limitations Sitting;Lifting;Standing;Walking;Writing;House hold activities    How long can you sit comfortably? 10 minutes; pt stays in bed when at home for most hours during the day    How long can you stand comfortably? Unable    How long can you walk comfortably? Unable    Diagnostic tests 08/27/2020 NCV study with EMG- Results: This is an abnormal study, there is electrodiagnostic evidence of myopathic changes involving bilateral lower extremity below knee, most consistent with  her history of severe rhabdomyolysis.  The abnormalities are limited to below knee muscles, she also have evidence of mild atrophy, significant myalgia at lower extremity compartment.  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    Patient Stated Goals Pt would like to be able to walk again without pain. She would also like to get back to swimming, driving, and bike riding, as well as playing with her dogs.    Currently in Pain? Yes    Pain Score 10-Worst pain ever    Pain Location Foot    Pain Orientation Right;Left    Pain Descriptors / Indicators Spasm;Stabbing;Cramping                               [facility]  Adult PT Treatment/Exercise - 09/08/20 0001       Transfers   Transfers Stand to Sit;Sit to [employer]    Sit to Stand 4: Min guard   x2   Stand to Sit 4: Min guard   x2   Squat Pivot Transfers 4: Min guard   x2     Knee/Hip Exercises: Standing   Other Standing Knee Exercises Standing lateral weight shifts in FWW 2x10 BIL      Manual Therapy   Manual Therapy Soft tissue mobilization    Soft tissue mobilization Deep tissue massage to BIL calves      Ankle Exercises: Seated   Other Seated Ankle Exercises seated heel raises and toe raises x 20 with handhold resistance    Other Seated Ankle Exercises Resisted ankle eversion with YTB isometric holds 2x10 with 5sec hold                    PT Education - 09/08/20 1155     Education Details Educated on proper form when performing exercises, as well as the efficacy of utilizing gate control theory when treating CRPS.    Person(s) Educated Patient;Parent(s)    Methods Explanation;Demonstration;Handout    Comprehension Verbalized understanding;Returned demonstration              PT Short Term Goals - 09/03/20 1150       PT SHORT TERM GOAL #1   Title Pt will demonstrate understanding and report adherence to an HEP to independently address her primary impairments in an effort to promote functional ability.    Baseline HEP given at eval    Time 3    Period Weeks    Status Achieved    Target Date 09/04/20               PT Long Term Goals - 08/14/20 1811       PT LONG TERM GOAL #1   Title Pt will demonstrate global BIL ankle MMT of 4/5 or higher in order to promote walking ability.    Baseline Pt BIL gros ankle MMT 3-/5    Time 6    Period Weeks    Status New    Target Date 09/25/20      PT LONG TERM GOAL #2   Title Pt will demonstrate ability to stand >5 minutes in order to progress to showering and toileting independently.    Baseline Pt unable to stand due to pain.    Time 6    Period  Weeks    Status New  Target Date 09/25/20      PT LONG TERM GOAL #3   Title Pt will demonstrate ability to walk at self-selected pace for >5 minutes in order to progress to walking her dogs without limitation.    Baseline Pt unable to walk.    Time 6    Period Weeks    Status New    Target Date 09/25/20      PT LONG TERM GOAL #4   Title Pt will demonstrate intact hot/cold and course/soft discrimination in BIL feet in order to protect her feet from injury when walking.    Baseline Poor hot/cold and course/soft discrimination in BIL feet    Time 6    Period Weeks    Status New    Target Date 09/25/20      PT LONG TERM GOAL #5   Title Pt will report ability to sit EOB >30 minutes with <5/10 pain in order to sit to watch a movie with minimal limitation.    Baseline Pt experiences 8/10 pain when sitting >10 minutes.    Time 6    Period Weeks    Status New    Target Date 09/25/20                   Plan - 09/08/20 1156     Clinical Impression Statement Due to the pt's pain presentation at time of treatment, the session was regressed with a focus on seated exercises and manual therapy. She was able to complete all exercises today with no increase in pain and with proper form. Deep tissue massage was utilized to apply gate control theory when managing the pt's pain. Following deep tissue massage, the pt reports her pain decreased from 10/10 to 7/10. She will continue to benefit from skilled PT to address her primary impairments and return to her prior level of function with less limitation.    Personal Factors and Comorbidities Comorbidity 3+    Comorbidities See medical history above    Examination-Activity Limitations Bathing;Locomotion Level;Transfers;Bed Mobility;Bend;Sit;Carry;Squat;Dressing;Stairs;Stand;Hygiene/Grooming;Toileting;Lift    Examination-Participation Restrictions Cleaning;Community Activity;Driving;Laundry    Stability/Clinical Decision Making  Evolving/Moderate complexity    Clinical Decision Making Moderate    Rehab Potential Good    PT Frequency 2x / week    PT Duration 6 weeks    PT Treatment/Interventions Aquatic Therapy;ADLs/Self Care Home Management;Biofeedback;Cryotherapy;Electrical Stimulation;DME Instruction;Ultrasound;Moist Heat;Gait training;Stair training;Functional mobility training;Therapeutic activities;Therapeutic exercise;Balance training;Neuromuscular re-education;Manual techniques;Patient/family education;Taping;Passive range of motion;Dry needling;Spinal Manipulations    PT Next Visit Plan progress standing weight shifts/ walking as able; target lower leg musculature in weight-bearing positions as able, assess pt's response to deep tissue massage and HEP    PT Home Exercise Plan [id number]    Consulted and Agree with Plan of Care Patient             Patient will benefit from skilled therapeutic intervention in order to improve the following deficits and impairments:  Abnormal gait, Decreased activity tolerance, Decreased balance, Decreased mobility, Decreased strength, Impaired sensation, Postural dysfunction, Improper body mechanics, Pain, Impaired UE functional use, Difficulty walking, Decreased endurance  Visit Diagnosis: Other disturbances of skin sensation  Pain in left foot  Pain in right foot  Muscle weakness (generalized)  Unsteadiness on feet     Problem List Patient Active Problem List   Diagnosis Date Noted   Weakness 08/19/2020   Gait abnormality 08/19/2020   Neuropathic pain 08/19/2020   Malnutrition of moderate degree 07/01/2020   Acute renal failure ([place]) 06/30/2020  Non-traumatic rhabdomyolysis    Elevated LFTs    Tardive dyskinesia 11/14/2019   Bipolar I disorder, most recent episode depressed ([place]) 09/13/2019   Social anxiety disorder 09/13/2019   Adjustment disorder with mixed anxiety and depressed mood 07/31/2019   Suicidal ideation    Post-operative state 07/19/2019    Seizures ([place]) 06/08/2019   Bipolar 1 disorder, depressed ([place]) 05/17/2019   Status epilepticus ([place]) 05/17/2019   Intentional benzodiazepine overdose ([place]) 03/29/2019   Intractable chronic 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 status migrainosus 02/08/2019   History of elective abortion 12/28/2018   QT prolongation 07/03/2018   AMS (altered mental status) 07/03/2018   Serum total bilirubin elevated 07/03/2018   Syncope 07/02/2018   Borderline personality disorder ([place]) 04/11/2017   Bipolar I disorder, current or most recent episode manic, with psychotic features ([place]) 04/10/2017   Bipolar disorder ([place]) 04/10/2017   Abnormal MRI of head 04/21/2016   Chronic migraine without aura 03/27/2015   Mild persistent asthma 12/30/2014   Allergic rhinitis due to pollen 12/30/2014   Rhinitis medicamentosa 12/30/2014   Nasal polyposis 12/30/2014    Vanessa [place], PT, DPT 09/08/20 12:00 PM   [facility] [address] [place], [place], [address] Phone: [phone]   Fax:  [phone]  Name: Carmen Daniels MRN: [id number] Date of Birth: [birth year]

## 2020-09-08 NOTE — Patient Instructions (Signed)
Pt instructed to perform regular deep tissue massage to her BIL lower legs to utilize gate control theory. Also instructed on proper form when performing exercises. New exercise: banded ankle eversion 2x10 with YTB

## 2020-09-10 ENCOUNTER — Ambulatory Visit: Payer: Medicaid Other

## 2020-09-10 ENCOUNTER — Other Ambulatory Visit: Payer: Self-pay

## 2020-09-10 DIAGNOSIS — R208 Other disturbances of skin sensation: Secondary | ICD-10-CM

## 2020-09-10 DIAGNOSIS — M6281 Muscle weakness (generalized): Secondary | ICD-10-CM

## 2020-09-10 DIAGNOSIS — M79671 Pain in right foot: Secondary | ICD-10-CM

## 2020-09-10 DIAGNOSIS — R2681 Unsteadiness on feet: Secondary | ICD-10-CM

## 2020-09-10 DIAGNOSIS — M79672 Pain in left foot: Secondary | ICD-10-CM

## 2020-09-10 NOTE — Therapy (Signed)
[place], [place], [address] Phone: [phone]   Fax:  [phone]  Physical Therapy Treatment  Patient Details  Name: Carmen Daniels MRN: [id number] Date of Birth: Jan 18, 1986 Referring Provider (PT): [name]   Encounter Date: 09/10/2020   PT End of Session - 09/10/20 0904     Visit Number 9    Number of Visits 13    Date for PT Re-Evaluation 09/25/20    Authorization Type [place] MCD    Authorization Time Period CCME Approved 12 PT visits from 09/03/2020 - 10/14/2020    Authorization - Visit Number 4    Authorization - Number of Visits 12    PT Start Time 0915    PT Stop Time 1000    PT Time Calculation (min) 45 min    Equipment Utilized During Treatment Gait belt    Activity Tolerance Patient limited by pain;Patient tolerated treatment well;Patient limited by fatigue    Behavior During Therapy [facility] for tasks assessed/performed             Past Medical History:  Diagnosis Date   Acute respiratory failure with hypoxia (HCC)    Acute rhinosinusitis 07/03/2018   Allergic rhinitis    Anxiety    Asthma    Back pain    Bipolar disorder (HCC)    Borderline personality disorder (HCC)    Bupropion overdose    Chronic pain    Chronic pain syndrome    Depression    Fibromyalgia    generalized   Hallucinations    Migraines    Nasal polyps    Nausea    Neck pain    OCD (obsessive compulsive disorder)    OCD (obsessive compulsive disorder)    Ovarian cyst    left   Pneumonia    2021   PONV (postoperative nausea and vomiting)    Schizophrenia ([place])    schizoaffective    Past Surgical History:  Procedure Laterality Date   ABDOMINAL HYSTERECTOMY     LAPAROSCOPIC TUBAL LIGATION Bilateral 06/13/2019   Procedure: LAPAROSCOPIC TUBAL LIGATION;  Surgeon: Chancy Milroy, MD;  Location: [place];  Service: Gynecology;  Laterality: Bilateral;  FILSHIE CLIPS   NASAL SINUS SURGERY      TUBAL LIGATION     tubes in ears     VAGINAL HYSTERECTOMY Bilateral 01/15/2020   Procedure: HYSTERECTOMY VAGINAL;  Surgeon: Chancy Milroy, MD;  Location: [place];  Service: Gynecology;  Laterality: Bilateral;   WISDOM TOOTH EXTRACTION      There were no vitals filed for this visit.   Subjective Assessment - 09/10/20 0906     Subjective Pt presents to PT with continued reports of 10/10 pain in bilateral feet and LEs. She has tried to be more compliant with HEP the last two days. Pt is ready to begin PT treatment at this time.    Currently in Pain? Yes    Pain Score 10-Worst pain ever                               [place] Adult PT Treatment/Exercise - 09/10/20 0001       Knee/Hip Exercises: Standing   Other Standing Knee Exercises Standing lateral weight shifts in FWW 2x10 BIL    Other Standing Knee Exercises amb in // bil UE support x 2 laps      Knee/Hip Exercises: Supine   Carmen Daniels  2 sets;10 reps    Other Supine Knee/Hip Exercises reciprocal cycling 2x10    Other Supine Knee/Hip Exercises supine ankle pumps x 20      Ankle Exercises: Seated   Towel Crunch --   x 2 min   Towel Inversion/Eversion --   eversion x 3 ea   Other Seated Ankle Exercises seated heel raises and toe raises x 20                      PT Short Term Goals - 09/03/20 1150       PT SHORT TERM GOAL #1   Title Pt will demonstrate understanding and report adherence to an HEP to independently address her primary impairments in an effort to promote functional ability.    Baseline HEP given at eval    Time 3    Period Weeks    Status Achieved    Target Date 09/04/20               PT Long Term Goals - 08/14/20 1811       PT LONG TERM GOAL #1   Title Pt will demonstrate global BIL ankle MMT of 4/5 or higher in order to promote walking ability.    Baseline Pt BIL gros ankle MMT 3-/5    Time 6    Period Weeks    Status New    Target Date 09/25/20      PT LONG TERM  GOAL #2   Title Pt will demonstrate ability to stand >5 minutes in order to progress to showering and toileting independently.    Baseline Pt unable to stand due to pain.    Time 6    Period Weeks    Status New    Target Date 09/25/20      PT LONG TERM GOAL #3   Title Pt will demonstrate ability to walk at self-selected pace for >5 minutes in order to progress to walking her dogs without limitation.    Baseline Pt unable to walk.    Time 6    Period Weeks    Status New    Target Date 09/25/20      PT LONG TERM GOAL #4   Title Pt will demonstrate intact hot/cold and course/soft discrimination in BIL feet in order to protect her feet from injury when walking.    Baseline Poor hot/cold and course/soft discrimination in BIL feet    Time 6    Period Weeks    Status New    Target Date 09/25/20      PT LONG TERM GOAL #5   Title Pt will report ability to sit EOB >30 minutes with <5/10 pain in order to sit to watch a movie with minimal limitation.    Baseline Pt experiences 8/10 pain when sitting >10 minutes.    Time 6    Period Weeks    Status New    Target Date 09/25/20                   Plan - 09/10/20 1135     Clinical Impression Statement Pt tolerated treatment fair, but conitnues to be limited by significant LE pain and discomfort. She has continued limitations in ambulation and transfers. PT again educated on use of night splints for improving muscle length and how they can brace with pillows to reduce rolling. She continues to benefit from skilled PT to address impairments, with progress as tolerated each session.  PT Treatment/Interventions Aquatic Therapy;ADLs/Self Care Home Management;Biofeedback;Cryotherapy;Electrical Stimulation;DME Instruction;Ultrasound;Moist Heat;Gait training;Stair training;Functional mobility training;Therapeutic activities;Therapeutic exercise;Balance training;Neuromuscular re-education;Manual techniques;Patient/family  education;Taping;Passive range of motion;Dry needling;Spinal Manipulations    PT Next Visit Plan progress standing weight shifts/ walking as able; target lower leg musculature in weight-bearing positions as able, assess pt's response to deep tissue massage and HEP    PT Home Exercise Plan [id number]             Patient will benefit from skilled therapeutic intervention in order to improve the following deficits and impairments:  Abnormal gait, Decreased activity tolerance, Decreased balance, Decreased mobility, Decreased strength, Impaired sensation, Postural dysfunction, Improper body mechanics, Pain, Impaired UE functional use, Difficulty walking, Decreased endurance  Visit Diagnosis: Other disturbances of skin sensation  Pain in left foot  Pain in right foot  Muscle weakness (generalized)  Unsteadiness on feet     Problem List Patient Active Problem List   Diagnosis Date Noted   Weakness 08/19/2020   Gait abnormality 08/19/2020   Neuropathic pain 08/19/2020   Malnutrition of moderate degree 07/01/2020   Acute renal failure ([place]) 06/30/2020   Non-traumatic rhabdomyolysis    Elevated LFTs    Tardive dyskinesia 11/14/2019   Bipolar I disorder, most recent episode depressed ([place]) 09/13/2019   Social anxiety disorder 09/13/2019   Adjustment disorder with mixed anxiety and depressed mood 07/31/2019   Suicidal ideation    Post-operative state 07/19/2019   Seizures ([place]) 06/08/2019   Bipolar 1 disorder, depressed ([place]) 05/17/2019   Status epilepticus ([place]) 05/17/2019   Intentional benzodiazepine overdose ([place]) 03/29/2019   Intractable chronic 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 status migrainosus 02/08/2019   History of elective abortion 12/28/2018   QT prolongation 07/03/2018   AMS (altered mental status) 07/03/2018   Serum total bilirubin elevated 07/03/2018   Syncope 07/02/2018   Borderline personality disorder ([place]) 04/11/2017   Bipolar I disorder, current or most  recent episode manic, with psychotic features ([place]) 04/10/2017   Bipolar disorder ([place]) 04/10/2017   Abnormal MRI of head 04/21/2016   Chronic migraine without aura 03/27/2015   Mild persistent asthma 12/30/2014   Allergic rhinitis due to pollen 12/30/2014   Rhinitis medicamentosa 12/30/2014   Nasal polyposis 12/30/2014    Ward Chatters, PT, DPT 09/10/20 11:39 AM  [facility] [address] [place], [place], [address] Phone: [phone]   Fax:  [phone]  Name: Carmen Daniels MRN: [id number] Date of Birth: 1985-06-18

## 2020-09-15 ENCOUNTER — Ambulatory Visit: Payer: Medicaid Other

## 2020-09-15 ENCOUNTER — Other Ambulatory Visit: Payer: Self-pay

## 2020-09-15 DIAGNOSIS — M79671 Pain in right foot: Secondary | ICD-10-CM

## 2020-09-15 DIAGNOSIS — R2681 Unsteadiness on feet: Secondary | ICD-10-CM

## 2020-09-15 DIAGNOSIS — M79672 Pain in left foot: Secondary | ICD-10-CM

## 2020-09-15 DIAGNOSIS — R208 Other disturbances of skin sensation: Secondary | ICD-10-CM

## 2020-09-15 DIAGNOSIS — M6281 Muscle weakness (generalized): Secondary | ICD-10-CM

## 2020-09-15 NOTE — Therapy (Signed)
[place], [place], [address] Phone: [phone]   Fax:  [phone]  Physical Therapy Treatment  Patient Details  Name: Carmen Daniels MRN: [id number] Date of Birth: [birth year] Referring Provider (PT): [name]   Encounter Date: 09/15/2020   PT End of Session - 09/15/20 0953     Visit Number 10    Number of Visits 13    Date for PT Re-Evaluation 09/25/20    Authorization Type  MCD    Authorization Time Period CCME Approved 12 PT visits from 09/03/2020 - 10/14/2020    Authorization - Visit Number 5    Authorization - Number of Visits 12    PT Start Time 0915    PT Stop Time 1000    PT Time Calculation (min) 45 min    Equipment Utilized During Treatment Gait belt    Activity Tolerance Patient limited by pain;Patient tolerated treatment well;Patient limited by fatigue    Behavior During Therapy [facility] for tasks assessed/performed             Past Medical History:  Diagnosis Date   Acute respiratory failure with hypoxia (HCC)    Acute rhinosinusitis 07/03/2018   Allergic rhinitis    Anxiety    Asthma    Back pain    Bipolar disorder (HCC)    Borderline personality disorder (HCC)    Bupropion overdose    Chronic pain    Chronic pain syndrome    Depression    Fibromyalgia    generalized   Hallucinations    Migraines    Nasal polyps    Nausea    Neck pain    OCD (obsessive compulsive disorder)    OCD (obsessive compulsive disorder)    Ovarian cyst    left   Pneumonia    2021   PONV (postoperative nausea and vomiting)    Schizophrenia ([place])    schizoaffective    Past Surgical History:  Procedure Laterality Date   ABDOMINAL HYSTERECTOMY     LAPAROSCOPIC TUBAL LIGATION Bilateral 06/13/2019   Procedure: LAPAROSCOPIC TUBAL LIGATION;  Surgeon: Chancy Milroy, MD;  Location: [place];  Service: Gynecology;  Laterality: Bilateral;  FILSHIE CLIPS   NASAL SINUS SURGERY      TUBAL LIGATION     tubes in ears     VAGINAL HYSTERECTOMY Bilateral 01/15/2020   Procedure: HYSTERECTOMY VAGINAL;  Surgeon: Chancy Milroy, MD;  Location: [place];  Service: Gynecology;  Laterality: Bilateral;   WISDOM TOOTH EXTRACTION      There were no vitals filed for this visit.   Subjective Assessment - 09/15/20 0906     Subjective Pt reports to PT with "12/10" pain today, adding that she was able to do more standing and walking in her walker at home. She is accompanied by her mother and father today, who wants to measure the parallel bars to recreate them at home. The pt reports she thinks her pain is due to feeling "confined" within her walker at home.    Patient is accompained by: Family member   Mother and father   Limitations Sitting;Lifting;Standing;Walking;Writing;House hold activities    Currently in Pain? Yes    Pain Score 10-Worst pain ever    Pain Location Foot    Pain Orientation Right;Left    Pain Descriptors / Indicators Spasm;Stabbing;Cramping  [place] Adult PT Treatment/Exercise - 09/15/20 0001       Transfers   Transfers Squat Pivot Transfers    Squat Pivot Transfers 4: Min guard   x4     Ambulation/Gait   Ambulation Distance (Feet) 30 Feet      Modalities   Modalities Electrical Stimulation      Electrical Stimulation   Electrical Stimulation Location BIL lateral calves    Electrical Stimulation Action IFC    Electrical Stimulation Parameters sweep 14-'22Hz'$  on R, 12-19 on L, 10 minutes each leg    Electrical Stimulation Goals Pain      Manual Therapy   Manual Therapy Soft tissue mobilization    Soft tissue mobilization Deep tissue massage to BIL calves                    PT Education - 09/15/20 0952     Education Details Educated on utility of aquatic vs. traditional PT. Also, educated on efficacy of E-stim for CRPS.    Person(s) Educated Patient;Parent(s)    Methods Explanation     Comprehension Verbalized understanding              PT Short Term Goals - 09/03/20 1150       PT SHORT TERM GOAL #1   Title Pt will demonstrate understanding and report adherence to an HEP to independently address her primary impairments in an effort to promote functional ability.    Baseline HEP given at eval    Time 3    Period Weeks    Status Achieved    Target Date 09/04/20               PT Long Term Goals - 08/14/20 1811       PT LONG TERM GOAL #1   Title Pt will demonstrate global BIL ankle MMT of 4/5 or higher in order to promote walking ability.    Baseline Pt BIL gros ankle MMT 3-/5    Time 6    Period Weeks    Status New    Target Date 09/25/20      PT LONG TERM GOAL #2   Title Pt will demonstrate ability to stand >5 minutes in order to progress to showering and toileting independently.    Baseline Pt unable to stand due to pain.    Time 6    Period Weeks    Status New    Target Date 09/25/20      PT LONG TERM GOAL #3   Title Pt will demonstrate ability to walk at self-selected pace for >5 minutes in order to progress to walking her dogs without limitation.    Baseline Pt unable to walk.    Time 6    Period Weeks    Status New    Target Date 09/25/20      PT LONG TERM GOAL #4   Title Pt will demonstrate intact hot/cold and course/soft discrimination in BIL feet in order to protect her feet from injury when walking.    Baseline Poor hot/cold and course/soft discrimination in BIL feet    Time 6    Period Weeks    Status New    Target Date 09/25/20      PT LONG TERM GOAL #5   Title Pt will report ability to sit EOB >30 minutes with <5/10 pain in order to sit to watch a movie with minimal limitation.    Baseline Pt experiences 8/10 pain when sitting >10  minutes.    Time 6    Period Weeks    Status New    Target Date 09/25/20                   Plan - 09/15/20 1139     Clinical Impression Statement Pt arrives to clinic today in  substantial pain. Due to this, the treatment was focused on decreasing neuropathic pain symptoms utilizing gate control theory. Deep tissue massage to lower leg musculature, as well as E-stim, helped to decrease the pt's pain to 6-7/10 prior to leaving PT. Upon pt's request to trial aquatic therapy, the efficacy of aquatic therapy was discussed with the pt. It is recommended that the pt transition from doing traditional PT twice a week to traditional PT once a week, accompanied by aquatic therapy once a week. She will continue to benefit from skilled PT to address her primary impairments and return to her prior level of function with less limitation.    Personal Factors and Comorbidities Comorbidity 3+    Comorbidities See medical history above    Examination-Activity Limitations Bathing;Locomotion Level;Transfers;Bed Mobility;Bend;Sit;Carry;Squat;Dressing;Stairs;Stand;Hygiene/Grooming;Toileting;Lift    Examination-Participation Restrictions Cleaning;Community Activity;Driving;Laundry    Stability/Clinical Decision Making Evolving/Moderate complexity    Clinical Decision Making Moderate    Rehab Potential Good    PT Frequency 2x / week    PT Duration 6 weeks    PT Treatment/Interventions Aquatic Therapy;ADLs/Self Care Home Management;Biofeedback;Cryotherapy;Electrical Stimulation;DME Instruction;Ultrasound;Moist Heat;Gait training;Stair training;Functional mobility training;Therapeutic activities;Therapeutic exercise;Balance training;Neuromuscular re-education;Manual techniques;Patient/family education;Taping;Passive range of motion;Dry needling;Spinal Manipulations    PT Next Visit Plan progress standing weight shifts/ walking as able; target lower leg musculature in weight-bearing positions as able, utlize E-stim/ deep tissue massage for pain reduction    PT Home Exercise Plan [id number]    Recommended Other Services Aquatic therapy    Consulted and Agree with Plan of Care Patient              Patient will benefit from skilled therapeutic intervention in order to improve the following deficits and impairments:  Abnormal gait, Decreased activity tolerance, Decreased balance, Decreased mobility, Decreased strength, Impaired sensation, Postural dysfunction, Improper body mechanics, Pain, Impaired UE functional use, Difficulty walking, Decreased endurance  Visit Diagnosis: Other disturbances of skin sensation  Pain in left foot  Pain in right foot  Muscle weakness (generalized)  Unsteadiness on feet     Problem List Patient Active Problem List   Diagnosis Date Noted   Weakness 08/19/2020   Gait abnormality 08/19/2020   Neuropathic pain 08/19/2020   Malnutrition of moderate degree 07/01/2020   Acute renal failure ([place]) 06/30/2020   Non-traumatic rhabdomyolysis    Elevated LFTs    Tardive dyskinesia 11/14/2019   Bipolar I disorder, most recent episode depressed ([place]) 09/13/2019   Social anxiety disorder 09/13/2019   Adjustment disorder with mixed anxiety and depressed mood 07/31/2019   Suicidal ideation    Post-operative state 07/19/2019   Seizures ([place]) 06/08/2019   Bipolar 1 disorder, depressed ([place]) 05/17/2019   Status epilepticus ([place]) 05/17/2019   Intentional benzodiazepine overdose ([place]) 03/29/2019   Intractable chronic 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 status migrainosus 02/08/2019   History of elective abortion 12/28/2018   QT prolongation 07/03/2018   AMS (altered mental status) 07/03/2018   Serum total bilirubin elevated 07/03/2018   Syncope 07/02/2018   Borderline personality disorder ([place]) 04/11/2017   Bipolar I disorder, current or most recent episode manic, with psychotic features ([place]) 04/10/2017   Bipolar disorder ([place]) 04/10/2017   Abnormal  MRI of head 04/21/2016   Chronic migraine without aura 03/27/2015   Mild persistent asthma 12/30/2014   Allergic rhinitis due to pollen 12/30/2014   Rhinitis medicamentosa 12/30/2014   Nasal polyposis  12/30/2014    Vanessa [place], PT, DPT 09/15/20 11:44 AM   [facility] [address] [place], [place], [address] Phone: [phone]   Fax:  [phone]  Name: Carmen Daniels MRN: [id number] Date of Birth: 1985/04/30

## 2020-09-15 NOTE — Patient Instructions (Signed)
Aquatic Therapy at [facility]-  What to Expect!  Where:   [facility] [address] [facility] [place], [place] [address] Rehab phone [phone]  NOTE:  You will receive an automated phone message reminding you of your appt and it will say the appointment is at the [address] [facility] [address] [facility] clinic.          How to Prepare: Please make sure you drink 8 ounces of water about one hour prior to your pool session A caregiver may attend if needed with the patient to help assist as needed. A caregiver can sit in the pool room on chair. Please arrive IN YOUR SUIT and 15 minutes prior to your appointment - this helps to avoid delays in starting your session. Please make sure to attend to any toileting needs prior to entering the pool Locker rooms for changing are provided.   There is direct access to the pool deck form the locker room.  You can lock your belongings in a locker with lock provided. Once on the pool deck your therapist will ask if you have signed the Patient  Consent and Assignment of Benefits form before beginning treatment Your therapist may take your blood pressure prior to, during and after your session if indicated We usually try and create a home exercise program based on activities we do in the pool.  Please be thinking about who might be able to assist you in the pool should you need to participate in an aquatic home exercise program at the time of discharge if you need assistance.  Some patients do not want to or do not have the ability to participate in an aquatic home program - this is not a barrier in any way to you participating in aquatic therapy as part of your current therapy plan! After Discharge from PT, you can continue using home program at  the [facility]/, there is a drop-in fee for $5 ($45 a month)or for 60 years  or older $4.00 ($40 a month for seniors ) or any local [employer] pool.  Memberships for purchase are  available for gym/pool at [facility]  IT IS VERY IMPORTANT THAT YOUR LAST VISIT BE IN THE CLINIC AT [address] AFTER YOUR LAST AQUATIC VISIT.  PLEASE MAKE SURE THAT YOU HAVE A [address]  APPOINTMENT SCHEDULED.   About the pool: Pool is located approximately 500 FT from the entrance of the building.  Please bring a support person if you need assistance traveling this      distance.   Your therapist will assist you in entering the water; there are two ways to           enter: stairs with railings, and a mechanical lift. Your therapist will determine the most appropriate way for you.  Water temperature is usually between 88-90 degrees  There may be up to 2 other swimmers in the pool at the same time  The pool deck is tile, please wear shoes with good traction if you prefer not to be barefoot.    Contact Info:  For appointment scheduling and cancellations:         Please call the [facility]  PH:[phone]              Aquatic Therapy  [facility]       All sessions are 45 minutes                                                    

## 2020-09-16 ENCOUNTER — Other Ambulatory Visit: Payer: Self-pay

## 2020-09-16 ENCOUNTER — Emergency Department
Admission: EM | Admit: 2020-09-16 | Discharge: 2020-09-16 | Discharge status: Against Medical Advice | Payer: Medicaid Other | Attending: Emergency Medicine | Admitting: Emergency Medicine

## 2020-09-16 ENCOUNTER — Encounter: Payer: Self-pay | Admitting: Intensive Care

## 2020-09-16 DIAGNOSIS — E860 Dehydration: Secondary | ICD-10-CM | POA: Insufficient documentation

## 2020-09-16 DIAGNOSIS — J4540 Moderate persistent asthma, uncomplicated: Secondary | ICD-10-CM | POA: Insufficient documentation

## 2020-09-16 DIAGNOSIS — F17210 Nicotine dependence, cigarettes, uncomplicated: Secondary | ICD-10-CM | POA: Insufficient documentation

## 2020-09-16 DIAGNOSIS — R531 Weakness: Secondary | ICD-10-CM | POA: Insufficient documentation

## 2020-09-16 DIAGNOSIS — R52 Pain, unspecified: Secondary | ICD-10-CM | POA: Insufficient documentation

## 2020-09-16 LAB — BASIC METABOLIC PANEL
Anion gap: 7 (ref 5–15)
BUN: 10 mg/dL (ref 6–20)
CO2: 30 mmol/L (ref 22–32)
Calcium: 9.9 mg/dL (ref 8.9–10.3)
Chloride: 100 mmol/L (ref 98–111)
Creatinine, Ser: 0.72 mg/dL (ref 0.44–1.00)
GFR, Estimated: >60 mL/min (ref >60)
Glucose, Bld: 96 mg/dL (ref 70–99)
Potassium: 4.1 mmol/L (ref 3.5–5.1)
Sodium: 137 mmol/L (ref 135–145)

## 2020-09-16 LAB — CBC WITH DIFFERENTIAL/PLATELET
Abs Immature Granulocytes: 0.02 10*3/uL (ref 0.00–0.07)
Basophils Absolute: 0.1 10*3/uL (ref 0.0–0.1)
Basophils Relative: 1 %
Eosinophils Absolute: 0.1 10*3/uL (ref 0.0–0.5)
Eosinophils Relative: 1 %
HCT: 41.8 % (ref 36.0–46.0)
Hemoglobin: 14.5 g/dL (ref 12.0–15.0)
Immature Granulocytes: 0 %
Lymphocytes Relative: 28 %
Lymphs Abs: 2.4 10*3/uL (ref 0.7–4.0)
MCH: 31.1 pg (ref 26.0–34.0)
MCHC: 34.7 g/dL (ref 30.0–36.0)
MCV: 89.7 fL (ref 80.0–100.0)
Monocytes Absolute: 0.6 10*3/uL (ref 0.1–1.0)
Monocytes Relative: 7 %
Neutro Abs: 5.3 10*3/uL (ref 1.7–7.7)
Neutrophils Relative %: 63 %
Platelets: 350 10*3/uL (ref 150–400)
RBC: 4.66 MIL/uL (ref 3.87–5.11)
RDW: 13 % (ref 11.5–15.5)
WBC: 8.4 10*3/uL (ref 4.0–10.5)
nRBC: 0 % (ref 0.0–0.2)

## 2020-09-16 MED ORDER — HALOPERIDOL LACTATE 5 MG/ML IJ SOLN
5.0000 mg | Freq: Once | INTRAMUSCULAR | Status: DC
  Filled 2020-09-16: qty 1

## 2020-09-16 MED ORDER — KETOROLAC TROMETHAMINE 30 MG/ML IJ SOLN
30.0000 mg | Freq: Once | INTRAMUSCULAR | Status: AC
  Administered 2020-09-16: 30 mg via INTRAVENOUS
  Filled 2020-09-16: qty 1

## 2020-09-16 MED ORDER — SODIUM CHLORIDE 0.9 % IV BOLUS
1000.0000 mL | Freq: Once | INTRAVENOUS | Status: AC
  Administered 2020-09-16: 1000 mL via INTRAVENOUS

## 2020-09-16 NOTE — ED Notes (Signed)
Pt requesting to leave AMA. Pt encouraged to stay until treatment has completed and provider is ready to discharge pt. Pt states, "I'd rather just go home and be in pain there." Provider notified. Pt strongly encouraged to wait for provider to come talk to her. Pt insists on leaving. Pt requests for this RN to remove IV and get a wheelchair to take her out of facility. PIV removed per pt's request. While retrieving wheelchair, MD went into pt's room to talk to pt.

## 2020-09-16 NOTE — ED Notes (Signed)
Pt provided with ice water per request. Pt asking for something for pain. Toradol offered again as ordered. Pt states, "I don't want that. It's just ibuprofen. When my pain is a 20, it's laughable that you want to give me toradol." MD aware that pt does not want toradol.

## 2020-09-16 NOTE — ED Notes (Signed)
Per MD, pt wants to leave and is not willing to stay for paperwork.

## 2020-09-16 NOTE — ED Triage Notes (Addendum)
Sent by urgent care after not being able to get b/p. Patient reports her pain has been so extreme the past week that she has not been eating or drinking. Patient c/o bilateral feet and calf pain. Reports fell two months ago and laid on the ground for hours before being found. 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 Reports unable to ambulate since accident.

## 2020-09-16 NOTE — ED Notes (Signed)
Pt refuses toradol, stating, "it's just ibuprofen."

## 2020-09-16 NOTE — ED Provider Notes (Addendum)
[facility] Emergency Department Provider Note  ____________________________________________   I have reviewed the triage vital signs and the nursing notes.   HISTORY  Chief Complaint Pain   History limited by: Not Limited   HPI Carmen Daniels is a 35 y.o. female who presents to the emergency department today because urgent care was unable to obtain a blood pressure. The patient states that she went to urgent care today because of concern for pain. She states that she has had pain ever since developing rhabdo. She additionally has had weakness in her legs. The patient has seen neurologist for her. States that she has been referred to a pain clinic. The pain has gotten worse over the past week and that is why she went to urgent care. States that she tried her home gabapentin and narcotic pain medication today without any relief.    Records reviewed. Per medical record review patient has a history of seeing neurology for complaints of lower extremity weakness.   Past Medical History:  Diagnosis Date   Acute respiratory failure with hypoxia (HCC)    Acute rhinosinusitis 07/03/2018   Allergic rhinitis    Anxiety    Asthma    Back pain    Bipolar disorder (HCC)    Borderline personality disorder (HCC)    Bupropion overdose    Chronic pain    Chronic pain syndrome    Depression    Fibromyalgia    generalized   Hallucinations    Migraines    Nasal polyps    Nausea    Neck pain    OCD (obsessive compulsive disorder)    OCD (obsessive compulsive disorder)    Ovarian cyst    left   Pneumonia    2021   PONV (postoperative nausea and vomiting)    Schizophrenia ([place])    schizoaffective    Patient Active Problem List   Diagnosis Date Noted   Weakness 08/19/2020   Gait abnormality 08/19/2020   Neuropathic pain 08/19/2020   Malnutrition of moderate degree 07/01/2020   Acute renal failure ([place]) 06/30/2020   Non-traumatic rhabdomyolysis    Elevated  LFTs    Tardive dyskinesia 11/14/2019   Bipolar I disorder, most recent episode depressed ([place]) 09/13/2019   Social anxiety disorder 09/13/2019   Adjustment disorder with mixed anxiety and depressed mood 07/31/2019   Suicidal ideation    Post-operative state 07/19/2019   Seizures ([place]) 06/08/2019   Bipolar 1 disorder, depressed ([place]) 05/17/2019   Status epilepticus ([place]) 05/17/2019   Intentional benzodiazepine overdose ([place]) 03/29/2019   Intractable chronic 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 status migrainosus 02/08/2019   History of elective abortion 12/28/2018   QT prolongation 07/03/2018   AMS (altered mental status) 07/03/2018   Serum total bilirubin elevated 07/03/2018   Syncope 07/02/2018   Borderline personality disorder ([place]) 04/11/2017   Bipolar I disorder, current or most recent episode manic, with psychotic features ([place]) 04/10/2017   Bipolar disorder ([place]) 04/10/2017   Abnormal MRI of head 04/21/2016   Chronic migraine without aura 03/27/2015   Mild persistent asthma 12/30/2014   Allergic rhinitis due to pollen 12/30/2014   Rhinitis medicamentosa 12/30/2014   Nasal polyposis 12/30/2014    Past Surgical History:  Procedure Laterality Date   ABDOMINAL HYSTERECTOMY     LAPAROSCOPIC TUBAL LIGATION Bilateral 06/13/2019   Procedure: LAPAROSCOPIC TUBAL LIGATION;  Surgeon: Chancy Milroy, MD;  Location: [facility];  Service: Gynecology;  Laterality: Bilateral;  FILSHIE CLIPS   NASAL  SINUS SURGERY     TUBAL LIGATION     tubes in ears     VAGINAL HYSTERECTOMY Bilateral 01/15/2020   Procedure: HYSTERECTOMY VAGINAL;  Surgeon: Chancy Milroy, MD;  Location: [facility];  Service: Gynecology;  Laterality: Bilateral;   WISDOM TOOTH EXTRACTION      Prior to Admission medications   Medication Sig Start Date End Date Taking? Authorizing Provider  beclomethasone (QVAR) 40 MCG/ACT inhaler Inhale 1 puff into the lungs 2 (two) times daily as needed (asthma).     [provider]  botulinum toxin Type A (BOTOX) 100 units SOLR injection Inject 200 Units into the muscle every 3 (three) months. Inject into head and neck muscles 04/30/19   Marcial Pacas, MD  clonazePAM (KLONOPIN) 1 MG tablet Take 1 mg by mouth 4 (four) times daily. 07/14/19   [provider]  DULoxetine (CYMBALTA) 60 MG capsule Take 1 capsule (60 mg total) by mouth daily. 08/19/20   Marcial Pacas, MD  Fremanezumab-vfrm (AJOVY) 225 MG/1.5ML SOAJ Inject 225 mg into the skin every 30 (thirty) days. 12/12/19   Marcial Pacas, MD  gabapentin (NEURONTIN) 300 MG capsule Take 1 capsule (300 mg total) by mouth 3 (three) times daily as needed. 07/24/20 08/23/20  Lucrezia Starch, MD  Multiple Vitamins-Calcium (ONE-A-DAY WOMENS FORMULA PO) Take 1 tablet by mouth daily.    [provider]  OXcarbazepine (TRILEPTAL) 150 MG tablet Take 2 tablets (300 mg total) by mouth 2 (two) times daily. 08/19/20   Marcial Pacas, MD  oxyCODONE-acetaminophen (PERCOCET/ROXICET) 5-325 MG tablet Take 1 tablet by mouth every 6 (six) hours as needed for severe pain.    [provider]  SUMAtriptan (IMITREX) 100 MG tablet Take 1 tablet (100 mg total) by mouth every 2 (two) hours as needed for migraine. MAY DOSE 2 PER DAY OR 3 12/12/19   Marcial Pacas, MD  zolpidem (AMBIEN) 10 MG tablet Take 10 mg by mouth at bedtime. 07/28/20   [provider]    Allergies Keflex [cephalexin]  Family History  Problem Relation Age of Onset   Healthy Father    Healthy Mother    Arthritis Maternal Grandmother    Arthritis Maternal Grandfather    Depression Paternal Grandmother     Social History Social History   Tobacco Use   Smoking status: Every Day    Packs/day: 0.50    Years: 15.00    Pack years: 7.50    Types: Cigarettes   Smokeless tobacco: Never  Vaping Use   Vaping Use: Every day   Substances: Nicotine  Substance Use Topics   Alcohol use: Never   Drug use: Yes    Comment: prescribed narcotics    Review of  Systems Constitutional: No fever/chills Eyes: No visual changes. ENT: No sore throat. Cardiovascular: Denies chest pain. Respiratory: Denies shortness of breath. Gastrointestinal: No abdominal pain.  No nausea, no vomiting.  No diarrhea.   Genitourinary: Negative for dysuria. Musculoskeletal: Positive for diffuse pain, worse in lower extremity. Skin: Negative for rash. Neurological: Negative for headaches, focal weakness or numbness.  ____________________________________________   PHYSICAL EXAM:  VITAL SIGNS: ED Triage Vitals [09/16/20 1537]  Enc Vitals Group     BP (!) 98/42     Pulse Rate 94     Resp 14     Temp 98 F (36.7 C)     Temp Source Oral     SpO2 100 %     Weight 175 lb (79.4 kg)  Height '5\' 11"'$  (1.803 m)     Head Circumference      Peak Flow      Pain Score 10   Constitutional: Alert and oriented.  Eyes: Conjunctivae are normal.  ENT      Head: Normocephalic and atraumatic.      Nose: No congestion/rhinnorhea.      Mouth/Throat: Mucous membranes are moist.      Neck: No stridor. Hematological/Lymphatic/Immunilogical: No cervical lymphadenopathy. Cardiovascular: Normal rate, regular rhythm.  No murmurs, rubs, or gallops.  Respiratory: Normal respiratory effort without tachypnea nor retractions. Breath sounds are clear and equal bilaterally. No wheezes/rales/rhonchi. Gastrointestinal: Soft and non tender. No rebound. No guarding.  Genitourinary: Deferred Musculoskeletal: No deformity. Neurologic:  Normal speech and language.  Skin:  Skin is warm, dry and intact. No rash noted. Psychiatric: Mood and affect are normal. Speech and behavior are normal. Patient exhibits appropriate insight and judgment.  ____________________________________________    LABS (pertinent positives/negatives)  BMP wnl CBC wbc 8.4, hgb 14.5, plt 350  ____________________________________________   EKG  I, Nance Pear, attending physician, personally viewed and  interpreted this EKG  EKG Time: 1549 Rate: 82 Rhythm: sinus rhythm Axis: normal Intervals: qtc 442 QRS: narrow ST changes: no st elevation Impression: abnormal ekg  ____________________________________________    RADIOLOGY  None  ____________________________________________   PROCEDURES  Procedures  ____________________________________________   INITIAL IMPRESSION / ASSESSMENT AND PLAN / ED COURSE  Pertinent labs & imaging results that were available during my care of the patient were reviewed by me and considered in my medical decision making (see chart for details).   Patient presented to the emergency department sent from urgent care because of concerns for low blood pressure.  Patient's blood pressure was perhaps a little low although not overly concerning for 35 year old female.  She was given some fluids here.  Blood pressure did improve.  In addition the patient's primary complaint was for acute on chronic pain.  Patient did state that Dilaudid has worked for her in the past although I did have concerns about using narcotics given the reported low blood pressure at urgent care as well as the fact she stated she took opioid pain medications earlier today without any effect.  Did want to trial other pain modalities and discussed this with the patient.  Initially was going to start with Toradol.  She states she got no relief from the Toradol.  I then discussed using other modalities to try to treat pain.  However she stated she would rather just go home.  This time do not think there is any further emergent work-up that is required.  Did recommend patient discuss with her outpatient doctors who are helping manage this.   Patient stated she did not want to wait for discharge paperwork.  ____________________________________________   FINAL CLINICAL IMPRESSION(S) / ED DIAGNOSES  Final diagnoses:  Pain     Note: This dictation was prepared with Dragon dictation. Any  transcriptional errors that result from this process are unintentional     Nance Pear, MD 09/16/20 1807    Nance Pear, MD 09/16/20 [phone]

## 2020-09-17 ENCOUNTER — Ambulatory Visit: Payer: Medicaid Other

## 2020-09-18 ENCOUNTER — Other Ambulatory Visit: Payer: Self-pay

## 2020-09-18 ENCOUNTER — Encounter (HOSPITAL_COMMUNITY): Payer: Self-pay | Admitting: *Deleted

## 2020-09-18 ENCOUNTER — Emergency Department (HOSPITAL_COMMUNITY)
Admission: EM | Admit: 2020-09-18 | Discharge: 2020-09-18 | Disposition: A | Discharge status: Home / Self Care | Payer: Medicaid Other | Attending: Emergency Medicine | Admitting: Emergency Medicine

## 2020-09-18 DIAGNOSIS — M79662 Pain in left lower leg: Secondary | ICD-10-CM | POA: Insufficient documentation

## 2020-09-18 DIAGNOSIS — M79672 Pain in left foot: Secondary | ICD-10-CM | POA: Diagnosis not present

## 2020-09-18 DIAGNOSIS — F17210 Nicotine dependence, cigarettes, uncomplicated: Secondary | ICD-10-CM | POA: Diagnosis not present

## 2020-09-18 DIAGNOSIS — J45909 Unspecified asthma, uncomplicated: Secondary | ICD-10-CM | POA: Insufficient documentation

## 2020-09-18 DIAGNOSIS — M79604 Pain in right leg: Secondary | ICD-10-CM

## 2020-09-18 DIAGNOSIS — M79671 Pain in right foot: Secondary | ICD-10-CM | POA: Insufficient documentation

## 2020-09-18 DIAGNOSIS — M79605 Pain in left leg: Secondary | ICD-10-CM

## 2020-09-18 DIAGNOSIS — R55 Syncope and collapse: Secondary | ICD-10-CM | POA: Insufficient documentation

## 2020-09-18 DIAGNOSIS — F419 Anxiety disorder, unspecified: Secondary | ICD-10-CM | POA: Diagnosis not present

## 2020-09-18 DIAGNOSIS — M79661 Pain in right lower leg: Secondary | ICD-10-CM | POA: Insufficient documentation

## 2020-09-18 DIAGNOSIS — G894 Chronic pain syndrome: Secondary | ICD-10-CM

## 2020-09-18 LAB — BASIC METABOLIC PANEL
Anion gap: 8 (ref 5–15)
BUN: 7 mg/dL (ref 6–20)
CO2: 27 mmol/L (ref 22–32)
Calcium: 9.5 mg/dL (ref 8.9–10.3)
Chloride: 103 mmol/L (ref 98–111)
Creatinine, Ser: 0.78 mg/dL (ref 0.44–1.00)
GFR, Estimated: >60 mL/min (ref >60)
Glucose, Bld: 66 mg/dL — ABNORMAL LOW (ref 70–99)
Potassium: 3.3 mmol/L — ABNORMAL LOW (ref 3.5–5.1)
Sodium: 138 mmol/L (ref 135–145)

## 2020-09-18 LAB — CBC WITH DIFFERENTIAL/PLATELET
Abs Immature Granulocytes: 0.01 10*3/uL (ref 0.00–0.07)
Basophils Absolute: 0.1 10*3/uL (ref 0.0–0.1)
Basophils Relative: 1 %
Eosinophils Absolute: 0.1 10*3/uL (ref 0.0–0.5)
Eosinophils Relative: 2 %
HCT: 38.6 % (ref 36.0–46.0)
Hemoglobin: 13.3 g/dL (ref 12.0–15.0)
Immature Granulocytes: 0 %
Lymphocytes Relative: 36 %
Lymphs Abs: 2.4 10*3/uL (ref 0.7–4.0)
MCH: 31.2 pg (ref 26.0–34.0)
MCHC: 34.5 g/dL (ref 30.0–36.0)
MCV: 90.6 fL (ref 80.0–100.0)
Monocytes Absolute: 0.5 10*3/uL (ref 0.1–1.0)
Monocytes Relative: 7 %
Neutro Abs: 3.6 10*3/uL (ref 1.7–7.7)
Neutrophils Relative %: 54 %
Platelets: 303 10*3/uL (ref 150–400)
RBC: 4.26 MIL/uL (ref 3.87–5.11)
RDW: 12.9 % (ref 11.5–15.5)
WBC: 6.6 10*3/uL (ref 4.0–10.5)
nRBC: 0 % (ref 0.0–0.2)

## 2020-09-18 LAB — CBG MONITORING, ED: Glucose-Capillary: 64 mg/dL — ABNORMAL LOW (ref 70–99)

## 2020-09-18 LAB — CK: Total CK: 16 U/L — ABNORMAL LOW (ref 38–234)

## 2020-09-18 LAB — MAGNESIUM: Magnesium: 1.7 mg/dL (ref 1.7–2.4)

## 2020-09-18 MED ORDER — GABAPENTIN 300 MG PO CAPS
300.0000 mg | ORAL_CAPSULE | Freq: Once | ORAL | Status: AC
  Administered 2020-09-18: 300 mg via ORAL
  Filled 2020-09-18: qty 1

## 2020-09-18 MED ORDER — POTASSIUM CHLORIDE CRYS ER 20 MEQ PO TBCR
40.0000 meq | EXTENDED_RELEASE_TABLET | Freq: Once | ORAL | Status: AC
  Administered 2020-09-18: 40 meq via ORAL
  Filled 2020-09-18: qty 2

## 2020-09-18 MED ORDER — KETOROLAC TROMETHAMINE 60 MG/2ML IM SOLN
30.0000 mg | Freq: Once | INTRAMUSCULAR | Status: AC
  Administered 2020-09-18: 30 mg via INTRAMUSCULAR
  Filled 2020-09-18: qty 2

## 2020-09-18 MED ORDER — ACETAMINOPHEN 500 MG PO TABS
1000.0000 mg | ORAL_TABLET | Freq: Once | ORAL | Status: AC
  Administered 2020-09-18: 1000 mg via ORAL
  Filled 2020-09-18: qty 2

## 2020-09-18 NOTE — Discharge Instructions (Addendum)
Continue taking your outpatient prescribed pain medication.  Follow-up with your pain specialist on Saturday.

## 2020-09-18 NOTE — ED Notes (Signed)
Pt did not want to wait for discharge papers. Pt mom wheeled pt out.

## 2020-09-18 NOTE — ED Triage Notes (Signed)
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 No relief with meds at home. Reports decreased po intake.

## 2020-09-18 NOTE — ED Notes (Signed)
Refused hcg lab. Pt states she had hysterectomy. Will notify MD.

## 2020-09-18 NOTE — ED Provider Notes (Signed)
[facility] EMERGENCY DEPARTMENT Provider Note   CSN: [id number] Arrival date & time: 09/18/20  1030     History Chief Complaint  Patient presents with  . Leg Pain    Carmen Daniels is a 35 y.o. female.   Leg Pain Associated symptoms: no back pain and no fever    35 year old female with a history of anxiety, bipolar disorder, borderline personality disorder, fibromyalgia presenting to the emergency department with bilateral lower extremity leg and foot pain.  She has been referred to a pain specialist due to her chronic pain.  She states that she has a history of rhabdomyolysis and feels that her lower extremities have not been the same since the pain.  She endorses a 3-year history of smoking.  She denies any claudication.  She states that the pain is burning.  She denies any numbness or weakness.  She has tried home narcotic pain medication and home gabapentin without any relief.  Past Medical History:  Diagnosis Date  . Acute respiratory failure with hypoxia ([place])   . Acute rhinosinusitis 07/03/2018  . Allergic rhinitis   . Anxiety   . Asthma   . Back pain   . Bipolar disorder ([place])   . Borderline personality disorder ([place])   . Bupropion overdose   . Chronic pain   . Chronic pain syndrome   . Depression   . Fibromyalgia    generalized  . Hallucinations   . Migraines   . Nasal polyps   . Nausea   . Neck pain   . OCD (obsessive compulsive disorder)   . OCD (obsessive compulsive disorder)   . Ovarian cyst    left  . Pneumonia    2021  . PONV (postoperative nausea and vomiting)   . Schizophrenia ([place])    schizoaffective    Patient Active Problem List   Diagnosis Date Noted  . Weakness 08/19/2020  . Gait abnormality 08/19/2020  . Neuropathic pain 08/19/2020  . Malnutrition of moderate degree 07/01/2020  . Acute renal failure ([place]) 06/30/2020  . Non-traumatic rhabdomyolysis   . Elevated LFTs   . Tardive dyskinesia 11/14/2019  . Bipolar I  disorder, most recent episode depressed ([place]) 09/13/2019  . Social anxiety disorder 09/13/2019  . Adjustment disorder with mixed anxiety and depressed mood 07/31/2019  . Suicidal ideation   . Post-operative state 07/19/2019  . Seizures ([place]) 06/08/2019  . Bipolar 1 disorder, depressed ([place]) 05/17/2019  . Status epilepticus ([place]) 05/17/2019  . Intentional benzodiazepine overdose ([place]) 03/29/2019  . Intractable chronic 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 status migrainosus 02/08/2019  . History of elective abortion 12/28/2018  . QT prolongation 07/03/2018  . AMS (altered mental status) 07/03/2018  . Serum total bilirubin elevated 07/03/2018  . Syncope 07/02/2018  . Borderline personality disorder ([place]) 04/11/2017  . Bipolar I disorder, current or most recent episode manic, with psychotic features ([place]) 04/10/2017  . Bipolar disorder ([place]) 04/10/2017  . Abnormal MRI of head 04/21/2016  . Chronic migraine without aura 03/27/2015  . Mild persistent asthma 12/30/2014  . Allergic rhinitis due to pollen 12/30/2014  . 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 Nasal polyposis 12/30/2014    Past Surgical History:  Procedure Laterality Date  . ABDOMINAL HYSTERECTOMY    . LAPAROSCOPIC TUBAL LIGATION Bilateral 06/13/2019   Procedure: LAPAROSCOPIC TUBAL LIGATION;  Surgeon: Chancy Milroy, MD;  Location: [facility];  Service: Gynecology;  Laterality: Bilateral;  FILSHIE CLIPS  . NASAL SINUS SURGERY    .  TUBAL LIGATION    . tubes in ears    . VAGINAL HYSTERECTOMY Bilateral 01/15/2020   Procedure: HYSTERECTOMY VAGINAL;  Surgeon: Chancy Milroy, MD;  Location: [facility];  Service: Gynecology;  Laterality: Bilateral;  . WISDOM TOOTH EXTRACTION       OB History   No obstetric history on file.     Family History  Problem Relation Age of Onset  . Healthy Father   . Healthy Mother   . Arthritis Maternal Grandmother   . Arthritis Maternal Grandfather   . Depression Paternal  Grandmother     Social History   Tobacco Use  . Smoking status: Every Day    Packs/day: 0.50    Years: 15.00    Pack years: 7.50    Types: Cigarettes  . Smokeless tobacco: Never  Vaping Use  . Vaping Use: Every day  . Substances: Nicotine  Substance Use Topics  . Alcohol use: Never  . Drug use: Yes    Comment: prescribed narcotics    Home Medications Prior to Admission medications   Medication Sig Start Date End Date Taking? Authorizing Provider  beclomethasone (QVAR) 40 MCG/ACT inhaler Inhale 1 puff into the lungs 2 (two) times daily as needed (asthma).    Yes [provider]  clonazePAM (KLONOPIN) 1 MG tablet Take 1 mg by mouth 4 (four) times daily. 07/14/19  Yes [provider]  DULoxetine (CYMBALTA) 60 MG capsule Take 1 capsule (60 mg total) by mouth daily. 08/19/20  Yes Marcial Pacas, MD  gabapentin (NEURONTIN) 300 MG capsule Take 1 capsule (300 mg total) by mouth 3 (three) times daily as needed. Patient taking differently: Take 300 mg by mouth 3 (three) times daily as needed (nerve pain). 07/24/20 09/18/20 Yes Lucrezia Starch, MD  Multiple Vitamins-Calcium (ONE-A-DAY WOMENS FORMULA PO) Take 1 tablet by mouth daily.   Yes [provider]  OXcarbazepine (TRILEPTAL) 150 MG tablet Take 2 tablets (300 mg total) by mouth 2 (two) times daily. 08/19/20  Yes Marcial Pacas, MD  traZODone (DESYREL) 50 MG tablet Take 50 mg by mouth at bedtime as needed for sleep. for sleep 04/05/20  Yes [provider]  zolpidem (AMBIEN) 10 MG tablet Take 10 mg by mouth at bedtime. 07/28/20  Yes [provider]  botulinum toxin Type A (BOTOX) 100 units SOLR injection Inject 200 Units into the muscle every 3 (three) months. Inject into head and neck muscles Patient not taking: Reported on 09/18/2020 04/30/19   Marcial Pacas, MD  Fremanezumab-vfrm (AJOVY) 225 MG/1.5ML SOAJ Inject 225 mg into the skin every 30 (thirty) days. Patient not taking: Reported on 09/18/2020 12/12/19   Marcial Pacas, MD  oxyCODONE-acetaminophen (PERCOCET/ROXICET) 5-325 MG tablet Take 1 tablet by mouth every 6 (six) hours as needed for severe pain. Patient not taking: No sig reported    [provider]  SUMAtriptan (IMITREX) 100 MG tablet Take 1 tablet (100 mg total) by mouth every 2 (two) hours as needed for migraine. MAY DOSE 2 PER DAY OR 3 Patient not taking: Reported on 09/18/2020 12/12/19   Marcial Pacas, MD    Allergies    Keflex [cephalexin]  Review of Systems   Review of Systems  Constitutional:  Negative for chills and fever.  HENT:  Negative for ear pain and sore throat.   Eyes:  Negative for pain and visual disturbance.  Respiratory:  Negative for cough and shortness of breath.   Cardiovascular:  Negative for chest pain and palpitations.  Gastrointestinal:  Negative for abdominal pain and vomiting.  Genitourinary:  Negative for dysuria and hematuria.  Musculoskeletal:  Negative for arthralgias and back pain.  Skin:  Negative for color change and rash.  Neurological:  Negative for seizures and syncope.  All other systems reviewed and are negative.  Physical Exam Updated Vital Signs BP 99/65 (BP Location: Right Arm)   Pulse 65   Temp 98.4 F (36.9 C) (Oral)   Resp 16   LMP  (LMP Unknown)   SpO2 100%   Physical Exam Vitals and nursing note reviewed.  Constitutional:      General: She is not in acute distress.    Appearance: She is well-developed.     Comments: Tearful  HENT:     Head: Normocephalic and atraumatic.  Eyes:     Conjunctiva/sclera: Conjunctivae normal.  Cardiovascular:     Rate and Rhythm: Normal rate and regular rhythm.     Pulses:          Femoral pulses are 2+ on the right side and 2+ on the left side.      Dorsalis pedis pulses are 2+ on the right side and 2+ on the left side.     Heart sounds: No murmur heard. Pulmonary:     Effort: Pulmonary effort is normal. No respiratory distress.     Breath sounds: Normal breath sounds.  Abdominal:      Palpations: Abdomen is soft.     Tenderness: There is no abdominal tenderness.  Musculoskeletal:        General: No swelling, deformity or signs of injury. Normal range of motion.     Cervical back: Neck supple.     Right lower leg: No edema.     Left lower leg: No edema.  Skin:    General: Skin is warm and dry.  Neurological:     General: No focal deficit present.     Mental Status: She is alert and oriented to person, place, and time. Mental status is at baseline.     Motor: No weakness.  Psychiatric:        Attention and Perception: Attention and perception normal.        Mood and Affect: Mood is anxious. Affect is tearful.        Speech: Speech normal.        Behavior: Behavior normal. Behavior is cooperative.        Thought Content: Thought content normal. Thought content is not paranoid or delusional. Thought content does not include homicidal or suicidal ideation. Thought content does not include suicidal plan.    ED Results / Procedures / Treatments   Labs (all labs ordered are listed, but only abnormal results are displayed) Labs Reviewed  BASIC METABOLIC PANEL - Abnormal; Notable for the following components:      Result Value   Potassium 3.3 (*)    Glucose, Bld 66 (*)    All other components within normal limits  CK - Abnormal; Notable for the following components:   Total CK 16 (*)    All other components within normal limits  CBG MONITORING, ED - Abnormal; Notable for the following components:   Glucose-Capillary 64 (*)    All other components within normal limits  CBC WITH DIFFERENTIAL/PLATELET  MAGNESIUM    EKG None  Radiology No results found.  Procedures Procedures   Medications Ordered in ED Medications  potassium chloride SA (KLOR-CON) CR tablet 40 mEq (40 mEq Oral Given 09/18/20 1758)  ketorolac (TORADOL) injection  30 mg (30 mg Intramuscular Given 09/18/20 1759)  acetaminophen (TYLENOL) tablet 1,000 mg (1,000 mg Oral Given 09/18/20 1759)   gabapentin (NEURONTIN) capsule 300 mg (300 mg Oral Given 09/18/20 1759)    ED Course  I have reviewed the triage vital signs and the nursing notes.  Pertinent labs & imaging results that were available during my care of the patient were reviewed by me and considered in my medical decision making (see chart for details).    MDM Rules/Calculators/A&P                           35 year old female with medical history as above to include borderline personality disorder, bipolar disorder, history of rhabdomyolysis presenting to the emergency department with acute on chronic bilateral lower extremity pain.  The patient states that her home pain regimen of gabapentin and opiates are not helping her pain.  She has an appointment with a pain specialist this Saturday and plans to follow-up.  On exam, the patient has intact DP pulses bilaterally.  She has nonspecific soft tissue tenderness to palpation with no significant bony tenderness.  Range of motion is intact of her foot and ankle and knees.  Compartments are soft.  I have low suspicion for peripheral vascular disease as the etiology of her pain.  No evidence for cellulitis or abscess.  No evidence for bony fracture.  Labs obtained with no concern for acute rhabdomyolysis at this time.  The patient was found to have mildly low potassium to 3.3 for which she was administered replenishment.  Her CK was normal.  Her renal function was normal.  The patient was administered Tylenol, her home gabapentin and Toradol for the pain without relief.  Given the chronicity of her pain, and felt that her chronic pain would be most appropriately followed up with a pain specialist in clinic.  I do not see an acute etiology for further intervention.  The patient is already on opiate prescription medication at home.  On chart review, the patient was seen by a neurologist on 08/27/2020 for neuropathic pain following rhabdomyolysis.  EMG nerve conduction study on August 28, 2018  showed myopathic findings mainly involving the below-knee muscles with well-preserved motor unit potential with mild axonal peripheral neuropathy.  Findings were concerning for rhabdomyolysis involving the lower extremity muscles, likely compartment syndrome, compromise of vascular supply and distal nerve damage.  She was referred to physical therapy.  She has previously had an MRI of the lumbar spine without significant for medial stenosis, had an normal thoracic spine MRI.   I suspect the etiology of the patient's pain today is continued neuropathic pain given her prior injury.  I explained to the patient and her mother that she should continue to take the medication she is currently on which include gabapentin 300 mg 3 times daily as needed.  I recommended the patient follow-up with her pain specialist appointment for continued management of her chronic neuropathic pain.  Final Clinical Impression(s) / ED Diagnoses Final diagnoses:  Bilateral leg pain  Chronic pain syndrome    Rx / DC Orders ED Discharge Orders     None        Regan Lemming, MD 09/20/20 1244

## 2020-09-18 NOTE — ED Notes (Signed)
Didn't get patient blood work patient still needs blood work done

## 2020-09-18 NOTE — ED Provider Notes (Signed)
Emergency Medicine Provider Triage Evaluation Note  Carmen Daniels , Daniels 35 y.o. female  was evaluated in triage.  Pt complains of extremity pain.  History of CRPS.  Was currently followed by pain clinic however is not currently followed by them.  Has diffuse pain to lower extremities.  States feels similar to when she had rhabdo previously.  States she is not eating and drinking due to the pain.  States she is unable to walk due to pain.  No recent trauma or injuries.  No edema to lower extremities redness or warmth  Review of Systems  Positive: Lower Extremity Negative: Fever, chills, nausea, vomiting, redness , Warm  Physical Exam  LMP  (LMP Unknown)  Gen:   Awake, no distress   Resp:  Normal effort  MSK:   Moves extremities without difficulty, diffuse tenderness with any palpation to upper or lower extremities Other:  2+ radial, DP, PT pulses.  No redness, warmth, pitting edema to lower EXTR  Medical Decision Making  Medically screening exam initiated at 11:09 AM.  Appropriate orders placed.  Carmen Daniels was informed that the remainder of the evaluation will be completed by another provider, this initial triage assessment does not replace that evaluation, and the importance of remaining in the ED until their evaluation is complete.  Lower extremity pain   Carmen Litaker A, PA-C 09/18/20 1110    Carmen Morgan, MD 09/18/20 2317

## 2020-09-22 ENCOUNTER — Other Ambulatory Visit: Payer: Self-pay

## 2020-09-22 ENCOUNTER — Ambulatory Visit: Payer: Medicaid Other | Attending: Internal Medicine

## 2020-09-22 DIAGNOSIS — M79672 Pain in left foot: Secondary | ICD-10-CM | POA: Diagnosis present

## 2020-09-22 DIAGNOSIS — M79671 Pain in right foot: Secondary | ICD-10-CM | POA: Insufficient documentation

## 2020-09-22 DIAGNOSIS — M6281 Muscle weakness (generalized): Secondary | ICD-10-CM | POA: Diagnosis present

## 2020-09-22 DIAGNOSIS — R208 Other disturbances of skin sensation: Secondary | ICD-10-CM | POA: Diagnosis present

## 2020-09-22 DIAGNOSIS — R2681 Unsteadiness on feet: Secondary | ICD-10-CM | POA: Diagnosis present

## 2020-09-22 NOTE — Patient Instructions (Signed)
Pt instructed on importance of utilizing lower pain states to focus on strengthening.

## 2020-09-22 NOTE — Therapy (Signed)
[facility] [place], [place], [address] Phone: [phone]   Fax:  [phone]  Physical Therapy Treatment  Patient Details  Name: Carmen Daniels MRN: [id number] Date of Birth: August 09, 1985 Referring Provider (PT): [name]   Encounter Date: 09/22/2020   PT End of Session - 09/22/20 0954     Visit Number 11    Number of Visits 13    Date for PT Re-Evaluation 09/25/20    Authorization Type [place] [employer]    Authorization Time Period [employer] Approved 12 PT visits from 09/03/2020 - 10/14/2020    Authorization - Visit Number 6    Authorization - Number of Visits 12    PT Start Time [id number]    PT Stop Time 1005    PT Time Calculation (min) 40 min    Equipment Utilized During Treatment Gait belt    Activity Tolerance Patient limited by pain;Patient tolerated treatment well;Patient limited by fatigue    Behavior During Therapy [facility] for tasks assessed/performed             Past Medical History:  Diagnosis Date   Acute respiratory failure with hypoxia (HCC)    Acute rhinosinusitis 07/03/2018   Allergic rhinitis    Anxiety    Asthma    Back pain    Bipolar disorder (HCC)    Borderline personality disorder (HCC)    Bupropion overdose    Chronic pain    Chronic pain syndrome    Depression    Fibromyalgia    generalized   Hallucinations    Migraines    Nasal polyps    Nausea    Neck pain    OCD (obsessive compulsive disorder)    OCD (obsessive compulsive disorder)    Ovarian cyst    left   Pneumonia    2021   PONV (postoperative nausea and vomiting)    Schizophrenia ([place])    schizoaffective    Past Surgical History:  Procedure Laterality Date   ABDOMINAL HYSTERECTOMY     LAPAROSCOPIC TUBAL LIGATION Bilateral 06/13/2019   Procedure: LAPAROSCOPIC TUBAL LIGATION;  Surgeon: Chancy Milroy, MD;  Location: [facility];  Service: Gynecology;  Laterality: Bilateral;  FILSHIE CLIPS   NASAL SINUS SURGERY      TUBAL LIGATION     tubes in ears     VAGINAL HYSTERECTOMY Bilateral 01/15/2020   Procedure: HYSTERECTOMY VAGINAL;  Surgeon: Chancy Milroy, MD;  Location: [facility];  Service: Gynecology;  Laterality: Bilateral;   WISDOM TOOTH EXTRACTION      There were no vitals filed for this visit.   Subjective Assessment - 09/22/20 0938     Subjective Pt reports being in 5/10 pain today, although it is much better than this past weekend when she went to the ED twice for pain. She was given suboxone for pain reduction, which has helped substantially. She has not been able to do her exercises this past week due to pain, but reports that she plans to be more consistent moving forward. She starts aquatic therapy this Wednesday and will be doing combination traditional/ aquatic therapy moving forward. She also adds that her dad is in the middle of making at-home parallel bars for home exercises.    Patient is accompained by: Family member   Mother   Limitations Sitting;Lifting;Standing;Walking;Writing;House hold activities    Currently in Pain? Yes    Pain Score 5     Pain Location Foot    Pain Orientation Right;Left  Pain Descriptors / Indicators Tingling;Burning    Pain Type Chronic pain                               [facility] Adult PT Treatment/Exercise - 09/22/20 0001       Transfers   Transfers Stand to Sit;Sit to Stand    Sit to Stand 4: Min guard   x2   Stand to Sit 4: Min guard   x2     Ambulation/Gait   Ambulation Distance (Feet) 30 Feet      Knee/Hip Exercises: Standing   Other Standing Knee Exercises Standing lateral weight shifts in FWW 2x10 BIL    Other Standing Knee Exercises amb in // bil UE support x 2 laps      Modalities   Modalities Electrical Stimulation      Electrical Stimulation   Electrical Stimulation Location BIL lateral calves    Electrical Stimulation Action IFC    Electrical Stimulation Parameters 80-'150Hz'$ , 40% scan, x29mn each leg     Electrical Stimulation Goals Pain                    PT Education - 09/22/20 [phone]    Education Details HEP reviewed with pt    Person(s) Educated Patient;Parent(s)    Methods Explanation;Demonstration    Comprehension Verbalized understanding;Returned demonstration              PT Short Term Goals - 09/03/20 1150       PT SHORT TERM GOAL #1   Title Pt will demonstrate understanding and report adherence to an HEP to independently address her primary impairments in an effort to promote functional ability.    Baseline HEP given at eval    Time 3    Period Weeks    Status Achieved    Target Date 09/04/20               PT Long Term Goals - 08/14/20 1811       PT LONG TERM GOAL #1   Title Pt will demonstrate global BIL ankle MMT of 4/5 or higher in order to promote walking ability.    Baseline Pt BIL gros ankle MMT 3-/5    Time 6    Period Weeks    Status New    Target Date 09/25/20      PT LONG TERM GOAL #2   Title Pt will demonstrate ability to stand >5 minutes in order to progress to showering and toileting independently.    Baseline Pt unable to stand due to pain.    Time 6    Period Weeks    Status New    Target Date 09/25/20      PT LONG TERM GOAL #3   Title Pt will demonstrate ability to walk at self-selected pace for >5 minutes in order to progress to walking her dogs without limitation.    Baseline Pt unable to walk.    Time 6    Period Weeks    Status New    Target Date 09/25/20      PT LONG TERM GOAL #4   Title Pt will demonstrate intact hot/cold and course/soft discrimination in BIL feet in order to protect her feet from injury when walking.    Baseline Poor hot/cold and course/soft discrimination in BIL feet    Time 6    Period Weeks    Status New  Target Date 09/25/20      PT LONG TERM GOAL #5   Title Pt will report ability to sit EOB >30 minutes with <5/10 pain in order to sit to watch a movie with minimal limitation.     Baseline Pt experiences 8/10 pain when sitting >10 minutes.    Time 6    Period Weeks    Status New    Target Date 09/25/20                   Plan - 09/22/20 0955     Clinical Impression Statement Pt responded well to all treatment today, demonstrating further improved pain following IFC. She also tolerated ambulation and weight-bearing well with no increased pain. She will continue to benefit from skilled PT to address her primary impairments and return to her prior level of function without limitation.    Personal Factors and Comorbidities Comorbidity 3+    Comorbidities See medical history above    Examination-Activity Limitations Bathing;Locomotion Level;Transfers;Bed Mobility;Bend;Sit;Carry;Squat;Dressing;Stairs;Stand;Hygiene/Grooming;Toileting;Lift    Examination-Participation Restrictions Cleaning;Community Activity;Driving;Laundry    Stability/Clinical Decision Making Evolving/Moderate complexity    Clinical Decision Making Moderate    Rehab Potential Good    PT Frequency 2x / week    PT Duration 6 weeks    PT Treatment/Interventions Aquatic Therapy;ADLs/Self Care Home Management;Biofeedback;Cryotherapy;Electrical Stimulation;DME Instruction;Ultrasound;Moist Heat;Gait training;Stair training;Functional mobility training;Therapeutic activities;Therapeutic exercise;Balance training;Neuromuscular re-education;Manual techniques;Patient/family education;Taping;Passive range of motion;Dry needling;Spinal Manipulations    PT Next Visit Plan progress standing weight shifts/ walking as able; target lower leg musculature in weight-bearing positions as able, utlize E-stim/ deep tissue massage for pain reduction    PT Home Exercise Plan [id number]    Consulted and Agree with Plan of Care Patient             Patient will benefit from skilled therapeutic intervention in order to improve the following deficits and impairments:  Abnormal gait, Decreased activity tolerance, Decreased  balance, Decreased mobility, Decreased strength, Impaired sensation, Postural dysfunction, Improper body mechanics, Pain, Impaired UE functional use, Difficulty walking, Decreased endurance  Visit Diagnosis: Other disturbances of skin sensation  Pain in left foot  Pain in right foot  Muscle weakness (generalized)  Unsteadiness on feet     Problem List Patient Active Problem List   Diagnosis Date Noted   Weakness 08/19/2020   Gait abnormality 08/19/2020   Neuropathic pain 08/19/2020   Malnutrition of moderate degree 07/01/2020   Acute renal failure ([place]) 06/30/2020   Non-traumatic rhabdomyolysis    Elevated LFTs    Tardive dyskinesia 11/14/2019   Bipolar I disorder, most recent episode depressed ([place]) 09/13/2019   Social anxiety disorder 09/13/2019   Adjustment disorder with mixed anxiety and depressed mood 07/31/2019   Suicidal ideation    Post-operative state 07/19/2019   Seizures ([place]) 06/08/2019   Bipolar 1 disorder, depressed ([place]) 05/17/2019   Status epilepticus ([place]) 05/17/2019   Intentional benzodiazepine overdose ([place]) 03/29/2019   Intractable chronic 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 status migrainosus 02/08/2019   History of elective abortion 12/28/2018   QT prolongation 07/03/2018   AMS (altered mental status) 07/03/2018   Serum total bilirubin elevated 07/03/2018   Syncope 07/02/2018   Borderline personality disorder ([place]) 04/11/2017   Bipolar I disorder, current or most recent episode manic, with psychotic features ([place]) 04/10/2017   Bipolar disorder ([place]) 04/10/2017   Abnormal MRI of head 04/21/2016   Chronic migraine without aura 03/27/2015   Mild persistent asthma 12/30/2014   Allergic rhinitis due to pollen 12/30/2014  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

## 2020-09-24 ENCOUNTER — Encounter: Payer: Self-pay | Admitting: Physical Therapy

## 2020-09-24 ENCOUNTER — Other Ambulatory Visit: Payer: Self-pay

## 2020-09-24 ENCOUNTER — Ambulatory Visit: Payer: Medicaid Other | Admitting: Physical Therapy

## 2020-09-24 DIAGNOSIS — R2681 Unsteadiness on feet: Secondary | ICD-10-CM

## 2020-09-24 DIAGNOSIS — R208 Other disturbances of skin sensation: Secondary | ICD-10-CM | POA: Diagnosis not present

## 2020-09-24 DIAGNOSIS — M6281 Muscle weakness (generalized): Secondary | ICD-10-CM

## 2020-09-24 DIAGNOSIS — M79672 Pain in left foot: Secondary | ICD-10-CM

## 2020-09-24 DIAGNOSIS — M79671 Pain in right foot: Secondary | ICD-10-CM

## 2020-09-24 NOTE — Therapy (Signed)
[place], [place], [address] Phone: [phone]   Fax:  [phone]  Physical Therapy Treatment  Patient Details  Name: Carmen Daniels MRN: [id number] Date of Birth: [birth year] Referring Provider (PT): [name]   Encounter Date: 09/24/2020   PT End of Session - 09/24/20 1519     Visit Number 12    Number of Visits 13    Date for PT Re-Evaluation 09/25/20    Authorization Type [place] MCD    Authorization Time Period CCME Approved 12 PT visits from 09/03/2020 - 10/14/2020    PT Start Time [id number]    PT Stop Time 1605    PT Time Calculation (min) 50 min    Activity Tolerance Patient limited by pain;Patient tolerated treatment well;Patient limited by fatigue    Behavior During Therapy [facility] for tasks assessed/performed             Past Medical History:  Diagnosis Date   Acute respiratory failure with hypoxia (HCC)    Acute rhinosinusitis 07/03/2018   Allergic rhinitis    Anxiety    Asthma    Back pain    Bipolar disorder (HCC)    Borderline personality disorder (HCC)    Bupropion overdose    Chronic pain    Chronic pain syndrome    Depression    Fibromyalgia    generalized   Hallucinations    Migraines    Nasal polyps    Nausea    Neck pain    OCD (obsessive compulsive disorder)    OCD (obsessive compulsive disorder)    Ovarian cyst    left   Pneumonia    2021   PONV (postoperative nausea and vomiting)    Schizophrenia ([place])    schizoaffective    Past Surgical History:  Procedure Laterality Date   ABDOMINAL HYSTERECTOMY     LAPAROSCOPIC TUBAL LIGATION Bilateral 06/13/2019   Procedure: LAPAROSCOPIC TUBAL LIGATION;  Surgeon: Chancy Milroy, MD;  Location: [place];  Service: Gynecology;  Laterality: Bilateral;  FILSHIE CLIPS   NASAL SINUS SURGERY     TUBAL LIGATION     tubes in ears     VAGINAL HYSTERECTOMY Bilateral 01/15/2020   Procedure: HYSTERECTOMY VAGINAL;  Surgeon:  Chancy Milroy, MD;  Location:  [place];  Service: Gynecology;  Laterality: Bilateral;   WISDOM TOOTH EXTRACTION      There were no vitals filed for this visit.   Subjective Assessment - 09/24/20 1708     Subjective Pt enters pool room for aquatics via W/C and accompanied by Carmen Daniels. Carmen Daniels sits with feet on seat in protective position.  She reports her pain in bil Lower legs as a 5/10 and does not want feet to be touched.  Pt states she has not been able to walk since May.  Carmen Daniels/dtr report they have an above ground pool to use and are anxious to learn how to exercise in the water.    Patient is accompained by: Family member   Carmen Daniels   Limitations Sitting;Lifting;Standing;Walking;Writing;House hold activities    Diagnostic tests 08/27/2020 NCV study with EMG- Results: This is an abnormal study, there is electrodiagnostic evidence of myopathic changes involving bilateral lower extremity below knee, most consistent with her history of severe rhabdomyolysis.  The abnormalities are limited to below knee muscles, she also have evidence of mild atrophy, significant myalgia at lower extremity compartment.  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    Patient Stated Goals Pt would like to be able to walk again without pain. She would also like to get back to swimming, driving, and bike riding, as well as playing with her dogs.    Currently in Pain? Yes    Pain Score 5     Pain Location Foot    Pain Orientation Right;Left    Pain Descriptors / Indicators Tingling;Burning    Pain Type Chronic pain             Aquatic therapy at [address] - therapeutic pool temp 90-92  degrees Pt enters pool area with Carmen Daniels and security member from [place] in clinic W/C.  Treatment took place in water 3.8 to  4 ft 8 in.feet deep  depending upon activity.  Pt entered and exited pool using lift chair and minimal A scooting from W/C to lift chair without wt bearing on feet due to pain     Carmen Barrall is educated on benefits of aquatics and therapeutic benefits of water. reinforcing principles and therapeutic effects of water.  Vinnie Level [id number] PT assisted Voncille Lo PT to safely acclimated to water.  Intially PT had total manual contact to prevent pt from flexing forward at trunk as pt sat on knee of PT in water.  With pt UE on edge of pool, she was encouraged to stand in place but could not extend legs. Pt required water walker for support for ambulation in water and needed PT to stabilize water walker while advancing.  Moving into deeper water with Pt at 20% wt bearing ( chest deep water) pt was able to stand with erect posture with UE support.   Pt began ambulating in water with decreased step lengths and planterflexed Left LE for 6 x 15 ft lengths.  Pt rested and then was able to march in place 10 x 2 ,hip ext/flex x 10 on R and L partial range, Hip abd/add using water walker for support.  Pt then continued water ambulation with water walker 6 x 15 ft, 2 x 15 ft side stepping .   Pt requiring several rests between exertion with exercises    Pt stood with bil. UE support on pool edge - performed stepping strategy - forward/back and out/in 10 reps each leg each direction  Squats (small range) x 10 reps with min assist in 4' water depth - bil. UE support on pool edge      Pt requires the buoyancy of water for active assisted exercises with buoyancy supported for strengthening and AROM exercises: PT  requires the viscosity of the water for resistance with strengthening exercises Hydrostatic pressure also supports joints by unweighting joint load by at least 50 % in 3-4 feet depth water. 80% in chest to neck deep water. Water will allow for  reduced joint loading through buoyancy to help patient improve posture without excess  stress and pain. Gait training able to be performed safely in water with less risk of fall than gait training on land Pt able to attempt standing and gait training in water with buoyancy for support, whereas gait training on land is unable to be performed due to pain of neuropathy and CPRS/weakness.                              PT Education - 09/24/20 1713  Education Details Initial education on aquatics and therapeutic principles of water. Gait in aquatic wt environment with 20 -50% wt bearing.    Person(s) Educated Patient;Parent(s)    Methods Explanation;Demonstration;Tactile cues;Verbal cues    Comprehension Verbalized understanding;Returned demonstration;Tactile cues required              PT Short Term Goals - 09/03/20 1150       PT SHORT TERM GOAL #1   Title Pt will demonstrate understanding and report adherence to an HEP to independently address her primary impairments in an effort to promote functional ability.    Baseline HEP given at eval    Time 3    Period Weeks    Status Achieved    Target Date 09/04/20               PT Long Term Goals - 08/14/20 1811       PT LONG TERM GOAL #1   Title Pt will demonstrate global BIL ankle MMT of 4/5 or higher in order to promote walking ability.    Baseline Pt BIL gros ankle MMT 3-/5    Time 6    Period Weeks    Status New    Target Date 09/25/20      PT LONG TERM GOAL #2   Title Pt will demonstrate ability to stand >5 minutes in order to progress to showering and toileting independently.    Baseline Pt unable to stand due to pain.    Time 6    Period Weeks    Status New    Target Date 09/25/20      PT LONG TERM GOAL #3   Title Pt will demonstrate ability to walk at self-selected pace for >5 minutes in order to progress to walking her dogs without limitation.    Baseline Pt unable to walk.    Time 6    Period Weeks    Status New    Target Date 09/25/20      PT LONG TERM GOAL #4    Title Pt will demonstrate intact hot/cold and course/soft discrimination in BIL feet in order to protect her feet from injury when walking.    Baseline Poor hot/cold and course/soft discrimination in BIL feet    Time 6    Period Weeks    Status New    Target Date 09/25/20      PT LONG TERM GOAL #5   Title Pt will report ability to sit EOB >30 minutes with <5/10 pain in order to sit to watch a movie with minimal limitation.    Baseline Pt experiences 8/10 pain when sitting >10 minutes.    Time 6    Period Weeks    Status New    Target Date 09/25/20                   Plan - 09/24/20 1715     Clinical Impression Statement Carmen Daniels enters aquatics in w/c and transfers by scooting without LE wt bear to chair lift at pool with min assist by Voncille Lo PT and Guido Sander PT in water.  Pt with some apprehension initially and inability to stand without max support by PT maintaining flexed trunk position. Pt placed in water walker to utilize UE strength. Carmen Daniels  demonstrates significant weakness in bil. LE's with L foot more plantarflexed than R in standing.. Pt initially had difficulty advancing LE's  in swing phase of gait but improved with practice and repetition  and increasing stride length.    Pt was able to participate in full 40 minute session with several rests. Near end of session pt was able to hold onto edge of pool and perform mini squats without water walker.Pt requires the buoyancy of water for active assisted exercises with buoyancy supported for strengthening and AROM exercises: PT  requires the viscosity of the water for resistance with strengthening exercises  Hydrostatic pressure also supports joints by unweighting joint load by at least 50 % in 3-4 feet depth water. 80% in chest to neck deep water.  Water will allow for  reduced joint loading through buoyancy to help patient improve posture without excess stress and pain.  Gait training able to be performed safely in  water with less risk of fall than gait training on land  Pt able to attempt standing and gait training in water with buoyancy for support, whereas gait training on land is unable to be performed due to pain of neuropathy and CPRS/weakness.    Personal Factors and Comorbidities Comorbidity 3+    Comorbidities See medical history above    Examination-Activity Limitations Bathing;Locomotion Level;Transfers;Bed Mobility;Bend;Sit;Carry;Squat;Dressing;Stairs;Stand;Hygiene/Grooming;Toileting;Lift    Examination-Participation Restrictions Cleaning;Community Activity;Driving;Laundry    PT Frequency 2x / week    PT Duration 6 weeks    PT Treatment/Interventions Aquatic Therapy;ADLs/Self Care Home Management;Biofeedback;Cryotherapy;Electrical Stimulation;DME Instruction;Ultrasound;Moist Heat;Gait training;Stair training;Functional mobility training;Therapeutic activities;Therapeutic exercise;Balance training;Neuromuscular re-education;Manual techniques;Patient/family education;Taping;Passive range of motion;Dry needling;Spinal Manipulations    PT Next Visit Plan progress standing weight shifts/ walking as able; target lower leg musculature in weight-bearing positions as able, utlize E-stim/ deep tissue massage for pain reduction    PT Home Exercise Plan [id number]    Consulted and Agree with Plan of Care Patient             Patient will benefit from skilled therapeutic intervention in order to improve the following deficits and impairments:  Abnormal gait, Decreased activity tolerance, Decreased balance, Decreased mobility, Decreased strength, Impaired sensation, Postural dysfunction, Improper body mechanics, Pain, Impaired UE functional use, Difficulty walking, Decreased endurance  Visit Diagnosis: Other disturbances of skin sensation  Pain in left foot  Pain in right foot  Muscle weakness (generalized)  Unsteadiness on feet     Problem List Patient Active Problem List   Diagnosis Date Noted    Weakness 08/19/2020   Gait abnormality 08/19/2020   Neuropathic pain 08/19/2020   Malnutrition of moderate degree 07/01/2020   Acute renal failure ([place]) 06/30/2020   Non-traumatic rhabdomyolysis    Elevated LFTs    Tardive dyskinesia 11/14/2019   Bipolar I disorder, most recent episode depressed ([place]) 09/13/2019   Social anxiety disorder 09/13/2019   Adjustment disorder with mixed anxiety and depressed mood 07/31/2019   Suicidal ideation    Post-operative state 07/19/2019   Seizures ([place]) 06/08/2019   Bipolar 1 disorder, depressed ([place]) 05/17/2019   Status epilepticus ([place]) 05/17/2019   Intentional benzodiazepine overdose ([place]) 03/29/2019   Intractable chronic 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 status migrainosus 02/08/2019   History of elective abortion 12/28/2018   QT prolongation 07/03/2018   AMS (altered mental status) 07/03/2018   Serum total bilirubin elevated 07/03/2018   Syncope 07/02/2018   Borderline personality disorder ([place]) 04/11/2017   Bipolar I disorder, current or most recent episode manic, with psychotic features ([place]) 04/10/2017   Bipolar disorder ([place]) 04/10/2017   Abnormal MRI of head 04/21/2016   Chronic migraine without aura 03/27/2015   Mild persistent asthma 12/30/2014   Allergic rhinitis due to pollen 12/30/2014  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 [place], [place], [address] Phone: [phone]   Fax:  [phone]  Name: BERDINE MCCLARAN MRN: [id number] Date of Birth: 05/19/1985

## 2020-09-29 ENCOUNTER — Ambulatory Visit: Payer: Medicaid Other

## 2020-10-01 ENCOUNTER — Ambulatory Visit (HOSPITAL_BASED_OUTPATIENT_CLINIC_OR_DEPARTMENT_OTHER): Payer: Medicaid Other | Attending: Internal Medicine | Admitting: Physical Therapy

## 2020-10-01 ENCOUNTER — Other Ambulatory Visit: Payer: Self-pay

## 2020-10-01 ENCOUNTER — Encounter (HOSPITAL_BASED_OUTPATIENT_CLINIC_OR_DEPARTMENT_OTHER): Payer: Self-pay | Admitting: Physical Therapy

## 2020-10-01 DIAGNOSIS — M6281 Muscle weakness (generalized): Secondary | ICD-10-CM | POA: Diagnosis present

## 2020-10-01 DIAGNOSIS — R2681 Unsteadiness on feet: Secondary | ICD-10-CM | POA: Insufficient documentation

## 2020-10-01 DIAGNOSIS — R208 Other disturbances of skin sensation: Secondary | ICD-10-CM | POA: Insufficient documentation

## 2020-10-01 DIAGNOSIS — M79671 Pain in right foot: Secondary | ICD-10-CM | POA: Diagnosis present

## 2020-10-01 DIAGNOSIS — M79672 Pain in left foot: Secondary | ICD-10-CM | POA: Insufficient documentation

## 2020-10-01 NOTE — Therapy (Signed)
[facility] [address] [place], [place], [address] Phone: [phone]   Fax:  [phone]  Physical Therapy Treatment  Patient Details  Name: Carmen Daniels MRN: [id number] Date of Birth: May 18, 1985 Referring Provider (PT): [name]   Encounter Date: 10/01/2020   PT End of Session - 10/01/20 1025     Visit Number 13    Number of Visits 13    Date for PT Re-Evaluation 09/25/20    Authorization Type [facility] MCD    PT Start Time 0901    PT Stop Time 0947    PT Time Calculation (min) 46 min    Equipment Utilized During Treatment Other (comment)   Ankle cuff and waist buoys, kick board, noodle/sqoodle, nekdoodle, weights and barbells, water walker   Activity Tolerance Patient limited by pain;Patient tolerated treatment well;Patient limited by fatigue             Past Medical History:  Diagnosis Date   Acute respiratory failure with hypoxia (HCC)    Acute rhinosinusitis 07/03/2018   Allergic rhinitis    Anxiety    Asthma    Back pain    Bipolar disorder (HCC)    Borderline personality disorder (HCC)    Bupropion overdose    Chronic pain    Chronic pain syndrome    Depression    Fibromyalgia    generalized   Hallucinations    Migraines    Nasal polyps    Nausea    Neck pain    OCD (obsessive compulsive disorder)    OCD (obsessive compulsive disorder)    Ovarian cyst    left   Pneumonia    2021   PONV (postoperative nausea and vomiting)    Schizophrenia ([place])    schizoaffective    Past Surgical History:  Procedure Laterality Date   ABDOMINAL HYSTERECTOMY     LAPAROSCOPIC TUBAL LIGATION Bilateral 06/13/2019   Procedure: LAPAROSCOPIC TUBAL LIGATION;  Surgeon: Chancy Milroy, MD;  Location: [place];  Service: Gynecology;  Laterality: Bilateral;  FILSHIE CLIPS   NASAL SINUS SURGERY     TUBAL LIGATION     tubes in ears     VAGINAL HYSTERECTOMY Bilateral 01/15/2020   Procedure: HYSTERECTOMY  VAGINAL;  Surgeon: Chancy Milroy, MD;  Location: [facility];  Service: Gynecology;  Laterality: Bilateral;   WISDOM TOOTH EXTRACTION      There were no vitals filed for this visit.   Subjective Assessment - 10/01/20 1020     Subjective "Was very tired after last aquatics session." Pt reports feeling as though she is able to exercises very efficeintly in setting.  Is comfortable and reports being a good swimmer prior to injury.    Patient is accompained by: Family member    Limitations Sitting;Lifting;Standing;Walking;Writing;House hold activities    Currently in Pain? Yes    Pain Score 5     Pain Location Foot    Pain Orientation Right    Pain Descriptors / Indicators Burning;Tingling    Pain Type Chronic pain    Pain Frequency Constant                                       PT Education - 10/01/20 1024     Education Details Importance and benefits of aerobic capacity challenges.    Person(s) Educated Patient    Methods Explanation    Comprehension  Verbalized understanding              PT Short Term Goals - 09/03/20 1150       PT SHORT TERM GOAL #1   Title Pt will demonstrate understanding and report adherence to an HEP to independently address her primary impairments in an effort to promote functional ability.    Baseline HEP given at eval    Time 3    Period Weeks    Status Achieved    Target Date 09/04/20               PT Long Term Goals - 08/14/20 1811       PT LONG TERM GOAL #1   Title Pt will demonstrate global BIL ankle MMT of 4/5 or higher in order to promote walking ability.    Baseline Pt BIL gros ankle MMT 3-/5    Time 6    Period Weeks    Status New    Target Date 09/25/20      PT LONG TERM GOAL #2   Title Pt will demonstrate ability to stand >5 minutes in order to progress to showering and toileting independently.    Baseline Pt unable to stand due to pain.    Time 6    Period Weeks    Status New    Target  Date 09/25/20      PT LONG TERM GOAL #3   Title Pt will demonstrate ability to walk at self-selected pace for >5 minutes in order to progress to walking her dogs without limitation.    Baseline Pt unable to walk.    Time 6    Period Weeks    Status New    Target Date 09/25/20      PT LONG TERM GOAL #4   Title Pt will demonstrate intact hot/cold and course/soft discrimination in BIL feet in order to protect her feet from injury when walking.    Baseline Poor hot/cold and course/soft discrimination in BIL feet    Time 6    Period Weeks    Status New    Target Date 09/25/20      PT LONG TERM GOAL #5   Title Pt will report ability to sit EOB >30 minutes with <5/10 pain in order to sit to watch a movie with minimal limitation.    Baseline Pt experiences 8/10 pain when sitting >10 minutes.    Time 6    Period Weeks    Status New    Target Date 09/25/20            Pt seen for aquatic therapy today.  Treatment took place in water 3.25-4.8 ft in depth at the [facility] pool. Temp of water was 91.  Pt entered/exited the pool via lift.  Ambulation with water walker 8 widths 4.8 ft forward and backward.  HHA to sba, VC for heel strike and foot flat.  Standing Step ups on water step x 5 reps leading with each LE forward then backward for strengthening and stretching of heel cords. VC and TC for proper execution and safety. Aerobic capacity challenges and LE strengthening suspended supine in water walker: kicking, scissoring and bicylicing alternating each 20 rapid rep then 20 slow x 3 trials. Prone: kicking Marching, add/abd and hip flex x 10 reps Gait and balance challenges: walking with yellow noodle 4 widths in 4.8 ft then to 3 foot.  VC for le advancement and erect posture.  Pt maintains upright posture amb  in towards 3 ft without LOB holding to yellow noodle.   Multiple recovery periods given.   Pt requires buoyancy for support and to offload joints with strengthening  exercises. Viscosity of the water is needed for resistance of strengthening; water current perturbations provides challenge to standing balance unsupported, requiring increased core activation.        Plan - 10/01/20 1026     Clinical Impression Statement Using hydrolic lift to enter/exit pool.  Pt reports feeling stiff.  Initial gait with noted unsteadiness using water walker as pt stretches knees into extension, vc for heel strike and toe off.  Balance/stability greatly improves throughout session translating to walking with barbell 2 widths of pool in 4.8 ft the to 3.6 feet.  Cue for hip strategy for balance to offset calf and foot pain.  Pt able to gain foot flat/nuetral ankle position with step ups on water step completed forward and back.  Pt mother trying to buy a water walker for her own use at her home.    Personal Factors and Comorbidities Comorbidity 3+    PT Treatment/Interventions Aquatic Therapy;ADLs/Self Care Home Management;Biofeedback;Cryotherapy;Electrical Stimulation;DME Instruction;Ultrasound;Moist Heat;Gait training;Stair training;Functional mobility training;Therapeutic activities;Therapeutic exercise;Balance training;Neuromuscular re-education;Manual techniques;Patient/family education;Taping;Passive range of motion;Dry needling;Spinal Manipulations    PT Home Exercise Plan [id number]             Patient will benefit from skilled therapeutic intervention in order to improve the following deficits and impairments:  Abnormal gait, Decreased activity tolerance, Decreased balance, Decreased mobility, Decreased strength, Impaired sensation, Postural dysfunction, Improper body mechanics, Pain, Impaired UE functional use, Difficulty walking, Decreased endurance  Visit Diagnosis: Other disturbances of skin sensation  Unsteadiness on feet  Pain in left foot  Pain in right foot  Muscle weakness (generalized)     Problem List Patient Active Problem List   Diagnosis Date  Noted   Weakness 08/19/2020   Gait abnormality 08/19/2020   Neuropathic pain 08/19/2020   Malnutrition of moderate degree 07/01/2020   Acute renal failure ([place]) 06/30/2020   Non-traumatic rhabdomyolysis    Elevated LFTs    Tardive dyskinesia 11/14/2019   Bipolar I disorder, most recent episode depressed ([place]) 09/13/2019   Social anxiety disorder 09/13/2019   Adjustment disorder with mixed anxiety and depressed mood 07/31/2019   Suicidal ideation    Post-operative state 07/19/2019   Seizures ([place]) 06/08/2019   Bipolar 1 disorder, depressed ([place]) 05/17/2019   Status epilepticus ([place]) 05/17/2019   Intentional benzodiazepine overdose ([place]) 03/29/2019   Intractable chronic 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 status migrainosus 02/08/2019   History of elective abortion 12/28/2018   QT prolongation 07/03/2018   AMS (altered mental status) 07/03/2018   Serum total bilirubin elevated 07/03/2018   Syncope 07/02/2018   Borderline personality disorder ([place]) 04/11/2017   Bipolar I disorder, current or most recent episode manic, with psychotic features ([place]) 04/10/2017   Bipolar disorder ([place]) 04/10/2017   Abnormal MRI of head 04/21/2016   Chronic migraine without aura 03/27/2015   Mild persistent asthma 12/30/2014   Allergic rhinitis due to pollen 12/30/2014   Rhinitis medicamentosa 12/30/2014   Nasal polyposis 12/30/2014    Vedia Pereyra MPT 10/01/2020, 7:07 PM  [facility] [address] [place], [place], [address] Phone: [phone]   Fax:  [phone]  Name: Carmen Daniels MRN: [id number] Date of Birth: 12/30/1985

## 2020-10-06 ENCOUNTER — Other Ambulatory Visit: Payer: Self-pay

## 2020-10-06 ENCOUNTER — Ambulatory Visit: Payer: Medicaid Other

## 2020-10-06 DIAGNOSIS — R208 Other disturbances of skin sensation: Secondary | ICD-10-CM

## 2020-10-06 DIAGNOSIS — M79672 Pain in left foot: Secondary | ICD-10-CM

## 2020-10-06 DIAGNOSIS — R2681 Unsteadiness on feet: Secondary | ICD-10-CM

## 2020-10-06 DIAGNOSIS — M79671 Pain in right foot: Secondary | ICD-10-CM

## 2020-10-06 DIAGNOSIS — M6281 Muscle weakness (generalized): Secondary | ICD-10-CM

## 2020-10-06 NOTE — Therapy (Signed)
[place], [place], [address] Phone: [phone]   Fax:  [phone]  Physical Therapy Treatment  Progress Note Reporting Period 08/14/2020 to 10/06/2020   See note below for Objective Data and Assessment of Progress/Goals.      Patient Details  Name: Carmen Daniels MRN: [id number] Date of Birth: 11-04-1985 Referring Provider (PT): [name]   Encounter Date: 10/06/2020   PT End of Session - 10/06/20 1435     Visit Number 14    Number of Visits 26    Date for PT Re-Evaluation 12/29/20    Authorization Type [place] MCD    Authorization Time Period CCME Approved 12 PT visits from 09/03/2020 - 10/14/2020    Authorization - Visit Number 9    Authorization - Number of Visits 12    PT Start Time 1400    PT Stop Time 1445    PT Time Calculation (min) 45 min    Equipment Utilized During Treatment Other (comment)    Activity Tolerance Patient limited by pain;Patient tolerated treatment well;Patient limited by fatigue    Behavior During Therapy [facility] for tasks assessed/performed             Past Medical History:  Diagnosis Date   Acute respiratory failure with hypoxia (HCC)    Acute rhinosinusitis 07/03/2018   Allergic rhinitis    Anxiety    Asthma    Back pain    Bipolar disorder (HCC)    Borderline personality disorder (HCC)    Bupropion overdose    Chronic pain    Chronic pain syndrome    Depression    Fibromyalgia    generalized   Hallucinations    Migraines    Nasal polyps    Nausea    Neck pain    OCD (obsessive compulsive disorder)    OCD (obsessive compulsive disorder)    Ovarian cyst    left   Pneumonia    2021   PONV (postoperative nausea and vomiting)    Schizophrenia ([place])    schizoaffective    Past Surgical History:  Procedure Laterality Date   ABDOMINAL HYSTERECTOMY     LAPAROSCOPIC TUBAL LIGATION Bilateral 06/13/2019   Procedure: LAPAROSCOPIC TUBAL LIGATION;  Surgeon: Chancy Milroy, MD;  Location: [place];  Service: Gynecology;  Laterality: Bilateral;  FILSHIE CLIPS   NASAL SINUS SURGERY     TUBAL LIGATION     tubes in ears     VAGINAL HYSTERECTOMY Bilateral 01/15/2020   Procedure: HYSTERECTOMY VAGINAL;  Surgeon: Chancy Milroy, MD;  Location: [place];  Service: Gynecology;  Laterality: Bilateral;   WISDOM TOOTH EXTRACTION      There were no vitals filed for this visit.   Subjective Assessment - 10/06/20 1350     Subjective Pt reports working in her home-made parallel bars several times this week, along with swimming in her pool, although the cool water temperature caused increase BIL foot pain. She also reports being sick last week, although she is feeling better today. She also reports that using her TENS unit has been helpful for pain reduction.    How long can you sit comfortably? 15 minutes    How long can you stand comfortably? 1 minute with walker/ paralle bars    How long can you walk comfortably? 30-57f in // bars    Diagnostic tests 08/27/2020 NCV study with EMG- Results: This is an abnormal study, there is electrodiagnostic evidence of myopathic  changes involving bilateral lower extremity below knee, most consistent with her history of severe rhabdomyolysis.  The abnormalities are limited to below knee muscles, she also have evidence of mild atrophy, significant myalgia at lower extremity compartment.  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    Currently in Pain? Yes    Pain Score 8     Pain Location Foot    Pain Orientation Right;Left    Pain Descriptors / Indicators Tingling;Burning    Pain Onset More than a month ago    Pain Frequency Constant                [facility] PT Assessment - 10/06/20 0001       Strength   Right Ankle Dorsiflexion 4-/5    Right Ankle  Plantar Flexion 4+/5    Left Ankle Dorsiflexion 4/5    Left Ankle Plantar Flexion 4/5                           [facility] Adult PT Treatment/Exercise - 10/06/20 0001       Transfers   Transfers Stand to Sit;Sit to Stand    Sit to Stand 4: Min guard   x5   Stand to Sit 4: Min guard   x5     Ambulation/Gait   Ambulation Distance (Feet) 100 Feet    Assistive device Parallel bars    Gait Pattern Step-through pattern;Antalgic    Gait Comments Pt utilized R ankle air splint today to control for medial/ lateral deviation      Knee/Hip Exercises: Standing   Other Standing Knee Exercises amb in // bil UE support 2x4 laps      Modalities   Modalities Electrical Stimulation      Electrical Stimulation   Electrical Stimulation Location BIL lateral calves    Electrical Stimulation Action IFC    Electrical Stimulation Parameters 80-'150Hz'$ , 40% scan, x40mn each leg    Electrical Stimulation Goals Pain;Strength                    PT Education - 10/06/20 1434     Education Details Pt educated on significance of progress made thus far through PT.    Person(s) Educated Patient    Methods Explanation    Comprehension Verbalized understanding              PT Short Term Goals - 09/03/20 1150       PT SHORT TERM GOAL #1   Title Pt will demonstrate understanding and report adherence to an HEP to independently address her primary impairments in an effort to promote functional ability.    Baseline HEP given at eval    Time 3    Period Weeks    Status Achieved    Target Date 09/04/20               PT Long Term Goals - 10/06/20 1409       PT LONG TERM GOAL #1   Title Pt will demonstrate global BIL ankle MMT of 4/5 or higher in order to promote walking ability.    Baseline Pt BIL gros ankle MMT 4-/5 to 4+/5    Time 6    Period Weeks    Status Achieved      PT LONG TERM GOAL #2   Title Pt will  demonstrate ability to stand >5 minutes in order to  progress to showering and toileting independently.    Baseline Pt reports ability to stand 1-2 minutes in // bars    Time 6    Period Weeks    Status On-going      PT LONG TERM GOAL #3   Title Pt will demonstrate ability to walk at self-selected pace for >5 minutes in order to progress to walking her dogs without limitation.    Baseline Pt reports ability to walk 1-2 minutes (84f) with // bars    Time 6    Period Weeks    Status On-going      PT LONG TERM GOAL #4   Title Pt will demonstrate intact hot/cold and course/soft discrimination in BIL feet in order to protect her feet from injury when walking.    Baseline Poor hot/cold and course/soft discrimination in BIL feet    Time 6    Period Weeks    Status On-going      PT LONG TERM GOAL #5   Title Pt will report ability to sit EOB >30 minutes with <5/10 pain in order to sit to watch a movie with minimal limitation.    Baseline Pt experiences 5-7/10 pain when sitting >15 minutes.    Time 6    Period Weeks    Status On-going                   Plan - 10/06/20 1545     Clinical Impression Statement Pt responded well to all treatment today, demonstrating reduction in pain with IFC electrical stimulation and improved gait pattern, endurance with walking; she was able to walk 4 full laps in the // bars with minA. Likewise, after re-assessment, the pt demonstrates improved ankle strength and reported tolerance to standing, sitting, and walking. She will continue to benefit from skilled PT to address her primary impairments and return to her prior level of function without limitation. In order to provide best care and utilize pt's remaining available visits prudently, the pt will transition to 1x/week. Her visits may remain alternating between aquatic therapy and traditional therapy.    Personal Factors and Comorbidities Comorbidity 3+    Comorbidities See medical history above    Examination-Activity Limitations Bathing;Locomotion  Level;Transfers;Bed Mobility;Bend;Sit;Carry;Squat;Dressing;Stairs;Stand;Hygiene/Grooming;Toileting;Lift    Examination-Participation Restrictions Cleaning;Community Activity;Driving;Laundry    Stability/Clinical Decision Making Evolving/Moderate complexity    Clinical Decision Making Moderate    Rehab Potential Good    PT Frequency 1x / week    PT Duration 12 weeks    PT Treatment/Interventions Aquatic Therapy;ADLs/Self Care Home Management;Biofeedback;Cryotherapy;Electrical Stimulation;DME Instruction;Ultrasound;Moist Heat;Gait training;Stair training;Functional mobility training;Therapeutic activities;Therapeutic exercise;Balance training;Neuromuscular re-education;Manual techniques;Patient/family education;Taping;Passive range of motion;Dry needling;Spinal Manipulations    PT Next Visit Plan progress standing weight shifts/ walking as able; target lower leg musculature in weight-bearing positions as able, utlize E-stim/ deep tissue massage for pain reduction    PT Home Exercise Plan [id number]   Recommended Other Services Aquatic therapy    Consulted and Agree with Plan of Care Patient             Patient will benefit from skilled therapeutic intervention in order to improve the following deficits and impairments:  Abnormal gait, Decreased activity tolerance, Decreased balance, Decreased mobility, Decreased strength, Impaired sensation, Postural dysfunction, Improper body mechanics, Pain, Impaired UE functional use, Difficulty walking, Decreased endurance  Visit Diagnosis: Other disturbances of skin sensation  Unsteadiness on feet  Pain in left foot  Pain in  right foot  Muscle weakness (generalized)     Problem List Patient Active Problem List   Diagnosis Date Noted   Weakness 08/19/2020   Gait abnormality 08/19/2020   Neuropathic pain 08/19/2020   Malnutrition of moderate degree 07/01/2020   Acute renal failure ([place]) 06/30/2020   Non-traumatic rhabdomyolysis    Elevated  LFTs    Tardive dyskinesia 11/14/2019   Bipolar I disorder, most recent episode depressed ([place]) 09/13/2019   Social anxiety disorder 09/13/2019   Adjustment disorder with mixed anxiety and depressed mood 07/31/2019   Suicidal ideation    Post-operative state 07/19/2019   Seizures ([place]) 06/08/2019   Bipolar 1 disorder, depressed ([place]) 05/17/2019   Status epilepticus ([place]) 05/17/2019   Intentional benzodiazepine overdose ([place]) 03/29/2019   Intractable chronic 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 status migrainosus 02/08/2019   History of elective abortion 12/28/2018   QT prolongation 07/03/2018   AMS (altered mental status) 07/03/2018   Serum total bilirubin elevated 07/03/2018   Syncope 07/02/2018   Borderline personality disorder ([place]) 04/11/2017   Bipolar I disorder, current or most recent episode manic, with psychotic features ([place]) 04/10/2017   Bipolar disorder ([place]) 04/10/2017   Abnormal MRI of head 04/21/2016   Chronic migraine without aura 03/27/2015   Mild persistent asthma 12/30/2014   Allergic rhinitis due to pollen 12/30/2014   Rhinitis medicamentosa 12/30/2014   Nasal polyposis 12/30/2014    Vanessa Jenkins, PT, DPT 10/06/20 3:56 PM   [facility] [address] [place], [place], [address] Phone: [phone]   Fax:  [phone]  Name: Carmen Daniels MRN: [id number] Date of Birth: Jul 02, 1985

## 2020-10-06 NOTE — Patient Instructions (Signed)
Pt instructed to utilize newly provided R ankle air splint during gait activities in // bars at home.

## 2020-10-08 ENCOUNTER — Ambulatory Visit (HOSPITAL_BASED_OUTPATIENT_CLINIC_OR_DEPARTMENT_OTHER): Payer: Medicaid Other | Admitting: Physical Therapy

## 2020-10-08 ENCOUNTER — Telehealth: Payer: Self-pay | Admitting: Neurology

## 2020-10-08 NOTE — Telephone Encounter (Signed)
Patient's next Botox appointment is 9/28. Today I received (1) 200 unit vial of Botox from [employer]. Patient PA through [facility] for Botox is still active. PA [id number] (04/15/20- 04/10/21).

## 2020-10-13 ENCOUNTER — Ambulatory Visit: Payer: Medicaid Other

## 2020-10-14 ENCOUNTER — Telehealth (HOSPITAL_COMMUNITY): Payer: Medicaid Other | Admitting: Psychiatry

## 2020-10-15 ENCOUNTER — Ambulatory Visit (HOSPITAL_BASED_OUTPATIENT_CLINIC_OR_DEPARTMENT_OTHER): Payer: Medicaid Other | Admitting: Physical Therapy

## 2020-10-15 ENCOUNTER — Encounter (HOSPITAL_BASED_OUTPATIENT_CLINIC_OR_DEPARTMENT_OTHER): Payer: Self-pay | Admitting: Physical Therapy

## 2020-10-15 ENCOUNTER — Other Ambulatory Visit: Payer: Self-pay

## 2020-10-15 DIAGNOSIS — M79672 Pain in left foot: Secondary | ICD-10-CM

## 2020-10-15 DIAGNOSIS — R208 Other disturbances of skin sensation: Secondary | ICD-10-CM | POA: Diagnosis not present

## 2020-10-15 DIAGNOSIS — R2681 Unsteadiness on feet: Secondary | ICD-10-CM

## 2020-10-15 DIAGNOSIS — M6281 Muscle weakness (generalized): Secondary | ICD-10-CM

## 2020-10-15 DIAGNOSIS — M79671 Pain in right foot: Secondary | ICD-10-CM

## 2020-10-15 NOTE — Therapy (Signed)
[place] [address] [place], [place], [address] Phone: [phone]   Fax:  [phone]  Physical Therapy Treatment  Patient Details  Name: Carmen Daniels MRN: [id number] Date of Birth: 1986/01/21 Referring Provider (PT): [name]   Encounter Date: 10/15/2020   PT End of Session - 10/15/20 1357     Visit Number 15    Number of Visits 26    Date for PT Re-Evaluation 12/29/20    Authorization Type [place] MCD    PT Start Time 1115    PT Stop Time 1204    PT Time Calculation (min) 49 min    Equipment Utilized During Treatment Other (comment)    Activity Tolerance Patient limited by pain;Patient tolerated treatment well;Patient limited by fatigue    Behavior During Therapy [facility] for tasks assessed/performed             Past Medical History:  Diagnosis Date   Acute respiratory failure with hypoxia (HCC)    Acute rhinosinusitis 07/03/2018   Allergic rhinitis    Anxiety    Asthma    Back pain    Bipolar disorder (HCC)    Borderline personality disorder (HCC)    Bupropion overdose    Chronic pain    Chronic pain syndrome    Depression    Fibromyalgia    generalized   Hallucinations    Migraines    Nasal polyps    Nausea    Neck pain    OCD (obsessive compulsive disorder)    OCD (obsessive compulsive disorder)    Ovarian cyst    left   Pneumonia    2021   PONV (postoperative nausea and vomiting)    Schizophrenia ([place])    schizoaffective    Past Surgical History:  Procedure Laterality Date   ABDOMINAL HYSTERECTOMY     LAPAROSCOPIC TUBAL LIGATION Bilateral 06/13/2019   Procedure: LAPAROSCOPIC TUBAL LIGATION;  Surgeon: Chancy Milroy, MD;  Location: [place];  Service: Gynecology;  Laterality: Bilateral;  FILSHIE CLIPS   NASAL SINUS SURGERY     TUBAL LIGATION     tubes in ears     VAGINAL HYSTERECTOMY Bilateral 01/15/2020   Procedure: HYSTERECTOMY VAGINAL;  Surgeon: Chancy Milroy, MD;   Location: [place];  Service: Gynecology;  Laterality: Bilateral;   WISDOM TOOTH EXTRACTION      There were no vitals filed for this visit.   Subjective Assessment - 10/15/20 1338     Subjective "everyone says I am making progress although it is hard for me to see.. . .I'm don't like the walker"    Currently in Pain? Yes    Pain Score 8     Pain Orientation Right;Left                                       PT Education - 10/15/20 1341     Education Details Reiterated above. Use of wheeled walker is progression from parallel bar. Will increase weight baering on feet.    Person(s) Educated Patient    Methods Explanation    Comprehension Verbalized understanding              PT Short Term Goals - 09/03/20 1150       PT SHORT TERM GOAL #1   Title Pt will demonstrate understanding and report adherence to an HEP to independently address her primary  impairments in an effort to promote functional ability.    Baseline HEP given at eval    Time 3    Period Weeks    Status Achieved    Target Date 09/04/20               PT Long Term Goals - 10/06/20 1409       PT LONG TERM GOAL #1   Title Pt will demonstrate global BIL ankle MMT of 4/5 or higher in order to promote walking ability.    Baseline Pt BIL gros ankle MMT 4-/5 to 4+/5    Time 6    Period Weeks    Status Achieved      PT LONG TERM GOAL #2   Title Pt will demonstrate ability to stand >5 minutes in order to progress to showering and toileting independently.    Baseline Pt reports ability to stand 1-2 minutes in // bars    Time 6    Period Weeks    Status On-going      PT LONG TERM GOAL #3   Title Pt will demonstrate ability to walk at self-selected pace for >5 minutes in order to progress to walking her dogs without limitation.    Baseline Pt reports ability to walk 1-2 minutes (52f) with // bars    Time 6    Period Weeks    Status On-going      PT LONG TERM GOAL #4   Title  Pt will demonstrate intact hot/cold and course/soft discrimination in BIL feet in order to protect her feet from injury when walking.    Baseline Poor hot/cold and course/soft discrimination in BIL feet    Time 6    Period Weeks    Status On-going      PT LONG TERM GOAL #5   Title Pt will report ability to sit EOB >30 minutes with <5/10 pain in order to sit to watch a movie with minimal limitation.    Baseline Pt experiences 5-7/10 pain when sitting >15 minutes.    Time 6    Period Weeks    Status On-going            Pt seen for aquatic therapy today.  Treatment took place in water 3.25-4.8 ft [facility] Temp of water was 91.  Pt entered/exited the pool via lift.   Ambulation with water walker 8 widths 4.8 ft forward and backward.  VC for heel strike and foot flat.  Sitting -On water bench submerged to chest standing to waist level sit to stand 2 x 5  -Progressed to 3rd step submerged to chest rising to knee depth 2 x 5. VC, TC and demonstration for proper technique/ weight shifting, use of UE/execution. From 3rd step PT assists minimally 1 and 2 rep to ensure safety.  Pt able to rise from lower surface gaining standing position then reaching for noodle.   Standing -Step ups on water step x 5 reps leading with each LE forward.  VC and TC for proper execution and safety.  Gait and balance challenges: walking with water walker 4 widths in 4.8 ft. Progressed to noodle 4 widths in 3 ft.  VC for le advancement and erect posture. Pt with minimal difficulty with inversion of left foot, improved step hitting foot flat rather than toe. Cueing for heel strike. Backward amb x 5 steps with cga.   Multiple recovery periods given.     Pt requires buoyancy for  support and to offload joints with strengthening exercises. Viscosity of the water is needed for resistance of strengthening; water current perturbations provides challenge to standing balance unsupported,  requiring increased core activation.        Plan - 10/15/20 1343     Clinical Impression Statement Focused on simulating walking with walker in setting/increasing weight bering on feet/toleration. Cueing for erect posture rather than forward hip flex instructed her not to "bail" with slight LOB into floating position rather regain psotioning which she is able to complete. Amb into 3 ft using water walker then noodle positioning in front of pt as a walker would be.  Pt able to stand erect and amb with subtle LOB which she can recover. Practices sit to stand transfers pushing up from surface using proper technique, rising than reching for Noodle.  Started on the water bench then progressed to 3rd water step (lower surface). Pt rises multiple times indep reaching for noodle.  Pt does c/o dizziness.  She is given water. maybe secondary to higher temp in pool area.  She and mother encouraged wih progress today.    PT Treatment/Interventions Aquatic Therapy;ADLs/Self Care Home Management;Biofeedback;Cryotherapy;Electrical Stimulation;DME Instruction;Ultrasound;Moist Heat;Gait training;Stair training;Functional mobility training;Therapeutic activities;Therapeutic exercise;Balance training;Neuromuscular re-education;Manual techniques;Patient/family education;Taping;Passive range of motion;Dry needling;Spinal Manipulations    PT Next Visit Plan advance toleration to weight bearing on /decreasing water depth with amb and stai climbing    PT Home Exercise Plan [phone]             Patient will benefit from skilled therapeutic intervention in order to improve the following deficits and impairments:  Abnormal gait, Decreased activity tolerance, Decreased balance, Decreased mobility, Decreased strength, Impaired sensation, Postural dysfunction, Improper body mechanics, Pain, Impaired UE functional use, Difficulty walking, Decreased endurance  Visit Diagnosis: Other disturbances of skin sensation  Muscle  weakness (generalized)  Unsteadiness on feet  Pain in left foot  Pain in right foot     Problem List Patient Active Problem List   Diagnosis Date Noted   Weakness 08/19/2020   Gait abnormality 08/19/2020   Neuropathic pain 08/19/2020   Malnutrition of moderate degree 07/01/2020   Acute renal failure ([place]) 06/30/2020   Non-traumatic rhabdomyolysis    Elevated LFTs    Tardive dyskinesia 11/14/2019   Bipolar I disorder, most recent episode depressed ([employer]) 09/13/2019   Social anxiety disorder 09/13/2019   Adjustment disorder with mixed anxiety and depressed mood 07/31/2019   Suicidal ideation    Post-operative state 07/19/2019   Seizures ([place]) 06/08/2019   Bipolar 1 disorder, depressed ([place]) 05/17/2019   Status epilepticus ([place]) 05/17/2019   Intentional benzodiazepine overdose ([place]) 03/29/2019   Intractable chronic 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 status migrainosus 02/08/2019   History of elective abortion 12/28/2018   QT prolongation 07/03/2018   AMS (altered mental status) 07/03/2018   Serum total bilirubin elevated 07/03/2018   Syncope 07/02/2018   Borderline personality disorder ([place]) 04/11/2017   Bipolar I disorder, current or most recent episode manic, with psychotic features ([place]) 04/10/2017   Bipolar disorder ([place]) 04/10/2017   Abnormal MRI of head 04/21/2016   Chronic migraine without aura 03/27/2015   Mild persistent asthma 12/30/2014   Allergic rhinitis due to pollen 12/30/2014   Rhinitis medicamentosa 12/30/2014   Nasal polyposis 12/30/2014    Vedia Pereyra MPT 10/15/2020, 1:59 PM  [facility] [address] [place], [place], [address] Phone: [phone]   Fax:  [phone]  Name: Carmen Daniels MRN: [id number] Date  of Birth: 08-Mar-1985

## 2020-10-20 ENCOUNTER — Other Ambulatory Visit: Payer: Self-pay

## 2020-10-20 ENCOUNTER — Ambulatory Visit: Payer: Medicaid Other

## 2020-10-20 DIAGNOSIS — M79671 Pain in right foot: Secondary | ICD-10-CM

## 2020-10-20 DIAGNOSIS — R208 Other disturbances of skin sensation: Secondary | ICD-10-CM

## 2020-10-20 DIAGNOSIS — M6281 Muscle weakness (generalized): Secondary | ICD-10-CM

## 2020-10-20 DIAGNOSIS — M79672 Pain in left foot: Secondary | ICD-10-CM

## 2020-10-20 DIAGNOSIS — R2681 Unsteadiness on feet: Secondary | ICD-10-CM

## 2020-10-20 NOTE — Therapy (Signed)
[place], [place], [address] Phone: [phone]   Fax:  [phone]  Physical Therapy Treatment  Patient Details  Name: Carmen Daniels MRN: [id number] Date of Birth: [birth year] Referring Provider (PT): [name]   Encounter Date: 10/20/2020   PT End of Session - 10/20/20 1059     Visit Number 16    Number of Visits 26    Date for PT Re-Evaluation 12/29/20    Authorization Type [place] [employer]    Authorization Time Period Pending re-authorization    PT Start Time 0915    PT Stop Time 1000    PT Time Calculation (min) 45 min    Equipment Utilized During Treatment Gait belt;Other (comment)   FWW   Activity Tolerance Patient limited by pain;Patient tolerated treatment well;Patient limited by fatigue    Behavior During Therapy [facility] for tasks assessed/performed             Past Medical History:  Diagnosis Date   Acute respiratory failure with hypoxia (HCC)    Acute rhinosinusitis 07/03/2018   Allergic rhinitis    Anxiety    Asthma    Back pain    Bipolar disorder (HCC)    Borderline personality disorder (HCC)    Bupropion overdose    Chronic pain    Chronic pain syndrome    Depression    Fibromyalgia    generalized   Hallucinations    Migraines    Nasal polyps    Nausea    Neck pain    OCD (obsessive compulsive disorder)    OCD (obsessive compulsive disorder)    Ovarian cyst    left   Pneumonia    2021   PONV (postoperative nausea and vomiting)    Schizophrenia ([place])    schizoaffective    Past Surgical History:  Procedure Laterality Date   ABDOMINAL HYSTERECTOMY     LAPAROSCOPIC TUBAL LIGATION Bilateral 06/13/2019   Procedure: LAPAROSCOPIC TUBAL LIGATION;  Surgeon: Chancy Milroy, MD;  Location: [facility];  Service: Gynecology;  Laterality: Bilateral;  FILSHIE CLIPS   NASAL SINUS SURGERY     TUBAL LIGATION     tubes in ears     VAGINAL HYSTERECTOMY Bilateral 01/15/2020    Procedure: HYSTERECTOMY VAGINAL;  Surgeon: Chancy Milroy, MD;  Location: [facility] [place];  Service: Gynecology;  Laterality: Bilateral;   WISDOM TOOTH EXTRACTION      There were no vitals filed for this visit.   Subjective Assessment - 10/20/20 0916     Subjective Pt reports improved pain, although she has 7/10 pain today. She adds that she needs assistance with adjusting her air splints today. She also adds that she has been working hard on her HEP and shows the PT a video of her walking in her home-made // bars.    Patient is accompained by: Family member   Mother   Pain Score 7     Pain Location Foot    Pain Orientation Right;Left    Pain Descriptors / Indicators Tingling;Burning    Pain Type Chronic pain    Pain Onset More than a month ago    Pain Frequency Constant                               [facility] Adult PT Treatment/Exercise - 10/20/20 0001       Transfers   Transfers Stand to Sit;Sit to Stand  Sit to Stand 5: Supervision   x5   Stand to Sit 5: Supervision   x5     Ambulation/Gait   Ambulation/Gait Yes    Ambulation/Gait Assistance 6: Modified independent (Device/Increase time)   bariatric FWW   Ambulation Distance (Feet) 185 Feet    Assistive device Rolling walker    Gait Pattern Step-through pattern    Gait Comments Pt utilized BIL ankle air splints today to control for medial/ lateral deviation      Self-Care   Self-Care Other Self-Care Comments   Showed pt how to adjust her ankle air splints. Adjusted pt's splints to comfortable setting.     Knee/Hip Exercises: Standing   Forward Step Up Step Height: 2";Both;2 sets;10 reps    Other Standing Knee Exercises Standing lateral weight shifts in FWW 2x10 BIL                    PT Education - 10/20/20 1058     Education Details Pt educated on ways to progress her HEP/ walking program. Demonstrated how to adjust air splint and how to manage a walker when ambulating to and from the toilet.     Person(s) Educated Patient;Parent(s)    Methods Explanation;Demonstration;Verbal cues    Comprehension Verbalized understanding;Returned demonstration;Verbal cues required              PT Short Term Goals - 09/03/20 1150       PT SHORT TERM GOAL #1   Title Pt will demonstrate understanding and report adherence to an HEP to independently address her primary impairments in an effort to promote functional ability.    Baseline HEP given at eval    Time 3    Period Weeks    Status Achieved    Target Date 09/04/20               PT Long Term Goals - 10/06/20 1409       PT LONG TERM GOAL #1   Title Pt will demonstrate global BIL ankle MMT of 4/5 or higher in order to promote walking ability.    Baseline Pt BIL gros ankle MMT 4-/5 to 4+/5    Time 6    Period Weeks    Status Achieved      PT LONG TERM GOAL #2   Title Pt will demonstrate ability to stand >5 minutes in order to progress to showering and toileting independently.    Baseline Pt reports ability to stand 1-2 minutes in // bars    Time 6    Period Weeks    Status On-going      PT LONG TERM GOAL #3   Title Pt will demonstrate ability to walk at self-selected pace for >5 minutes in order to progress to walking her dogs without limitation.    Baseline Pt reports ability to walk 1-2 minutes (18f) with // bars    Time 6    Period Weeks    Status On-going      PT LONG TERM GOAL #4   Title Pt will demonstrate intact hot/cold and course/soft discrimination in BIL feet in order to protect her feet from injury when walking.    Baseline Poor hot/cold and course/soft discrimination in BIL feet    Time 6    Period Weeks    Status On-going      PT LONG TERM GOAL #5   Title Pt will report ability to sit EOB >30 minutes with <5/10 pain in order to  sit to watch a movie with minimal limitation.    Baseline Pt experiences 5-7/10 pain when sitting >15 minutes.    Time 6    Period Weeks    Status On-going                    Plan - 10/20/20 1111     Clinical Impression Statement Pt responded well to all treatment today, demonstrating the ability to ambulate 4f and 108frespectively with a seated rest in-between while utilizing a bariatric FWW and BIL ankle air splints. She reports that she was surprized how much she could do today. She also reiterates that the bariatric FWW is much more comfortable to ambulate with than a standard FWW. Due to this, PT recommends ordering a bariatric FWW for the pt to use for household ambulation. She is currently very limited when attempting to walk in her current walker at home. This would improve the pt's quality of life and maximize the pt's functional ability while performing independent rehab. The pt will continue to benefit from skilled PT to address her primary impairments and return to her prior level of function without limitaiton. Due to pt's re-authorization status pending, today's visit will carry no charges.    Personal Factors and Comorbidities Comorbidity 3+    Comorbidities See medical history above    Examination-Activity Limitations Bathing;Locomotion Level;Transfers;Bed Mobility;Bend;Sit;Carry;Squat;Dressing;Stairs;Stand;Hygiene/Grooming;Toileting;Lift    Examination-Participation Restrictions Cleaning;Community Activity;Driving;Laundry    Stability/Clinical Decision Making Evolving/Moderate complexity    Clinical Decision Making Moderate    Rehab Potential Good    PT Frequency 1x / week    PT Duration 12 weeks    PT Treatment/Interventions Aquatic Therapy;ADLs/Self Care Home Management;Biofeedback;Cryotherapy;Electrical Stimulation;DME Instruction;Ultrasound;Moist Heat;Gait training;Stair training;Functional mobility training;Therapeutic activities;Therapeutic exercise;Balance training;Neuromuscular re-education;Manual techniques;Patient/family education;Taping;Passive range of motion;Dry needling;Spinal Manipulations    PT Next Visit Plan Progress  ambulation/ gait training, continue to progress step training    PT Home Exercise Plan [id number]    Consulted and Agree with Plan of Care Patient             Patient will benefit from skilled therapeutic intervention in order to improve the following deficits and impairments:  Abnormal gait, Decreased activity tolerance, Decreased balance, Decreased mobility, Decreased strength, Impaired sensation, Postural dysfunction, Improper body mechanics, Pain, Impaired UE functional use, Difficulty walking, Decreased endurance  Visit Diagnosis: Other disturbances of skin sensation  Muscle weakness (generalized)  Unsteadiness on feet  Pain in left foot  Pain in right foot     Problem List Patient Active Problem List   Diagnosis Date Noted   Weakness 08/19/2020   Gait abnormality 08/19/2020   Neuropathic pain 08/19/2020   Malnutrition of moderate degree 07/01/2020   Acute renal failure (HCO[place]dens[place]05/10/2020   Non-traumatic rhabdomyolysis    Elevated LFTs    Tardive dyskinesia 11/14/2019   Bipolar I disorder, most recent episode depressed (HCD[place]07/22/2021   Social anxiety disorder 09/13/2019   Adjustment disorder with mixed anxiety and depressed mood 07/31/2019   Suicidal ideation    Post-operative state 07/19/2019   Seizures (HCPow[place]04/16/2021   Bipolar 1 disorder, depressed (HCLyons [place]03/25/2021   Status epilepticus (HCNeko[place]03/25/2021   Intentional benzodiazepine overdose (HCBox Elder02/05/2019   Intractable chronic 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 status migrainosus 02/08/2019   History of elective abortion 12/28/2018   QT prolongation 07/03/2018   AMS (altered mental status) 07/03/2018   Serum total bilirubin elevated 07/03/2018   Syncope 07/02/2018   Borderline personality disorder (HCFountainebleau02/18/2019   Bipolar  I disorder, current or most recent episode manic, with psychotic features ([place]) 04/10/2017   Bipolar disorder ([place]) 04/10/2017   Abnormal MRI of head 04/21/2016   Chronic migraine  without aura 03/27/2015   Mild persistent asthma 12/30/2014   Allergic rhinitis due to pollen 12/30/2014   Rhinitis medicamentosa 12/30/2014   Nasal polyposis 12/30/2014    Vanessa Rotonda, PT, DPT 10/20/20 11:17 AM   [facility] [address] [place], [place], [address] Phone: [phone]   Fax:  [phone]  Name: Carmen Daniels MRN: [id number] Date of Birth: [birth year]

## 2020-10-20 NOTE — Patient Instructions (Signed)
Pt instructed to follow up with [facility] regarding referral for bariatric FWW. Also instructed to independently progress her HEP as able.

## 2020-10-29 ENCOUNTER — Ambulatory Visit (HOSPITAL_BASED_OUTPATIENT_CLINIC_OR_DEPARTMENT_OTHER): Payer: Medicaid Other | Attending: Internal Medicine | Admitting: Physical Therapy

## 2020-10-29 ENCOUNTER — Encounter: Payer: Self-pay | Admitting: Registered"

## 2020-10-29 ENCOUNTER — Other Ambulatory Visit: Payer: Self-pay

## 2020-10-29 ENCOUNTER — Encounter: Payer: Medicaid Other | Admitting: Registered"

## 2020-10-29 DIAGNOSIS — M79671 Pain in right foot: Secondary | ICD-10-CM | POA: Diagnosis present

## 2020-10-29 DIAGNOSIS — R634 Abnormal weight loss: Secondary | ICD-10-CM | POA: Insufficient documentation

## 2020-10-29 DIAGNOSIS — R2681 Unsteadiness on feet: Secondary | ICD-10-CM | POA: Insufficient documentation

## 2020-10-29 DIAGNOSIS — M6281 Muscle weakness (generalized): Secondary | ICD-10-CM | POA: Insufficient documentation

## 2020-10-29 DIAGNOSIS — R208 Other disturbances of skin sensation: Secondary | ICD-10-CM | POA: Insufficient documentation

## 2020-10-29 DIAGNOSIS — M79672 Pain in left foot: Secondary | ICD-10-CM | POA: Diagnosis present

## 2020-10-29 NOTE — Patient Instructions (Addendum)
-   Aim to have 1/2 plate of starch/grain + 1/4 plate of lean protein + 1/4 plate of vegetables/fruit + lipid + calcium.   - Aim to have breakfast, lunch, and dinner.   - Have conversation with healthcare providers (medical doctor and therapist) about more frequent follow-ups.

## 2020-10-29 NOTE — Therapy (Signed)
[facility] [address] [place], [place], [address] Phone: [phone]   Fax:  [phone]  Physical Therapy Treatment  Patient Details  Name: Carmen Daniels MRN: [id number] Date of Birth: 1985/03/23 Referring Provider (PT): [name]   Encounter Date: 10/29/2020   PT End of Session - 10/29/20 1437     Visit Number 17    Number of Visits 26    Date for PT Re-Evaluation 12/29/20    PT Start Time [id number]    PT Stop Time 1500    PT Time Calculation (min) 65 min    Equipment Utilized During Treatment Gait belt;Other (comment)    Activity Tolerance Patient limited by pain;Patient tolerated treatment well    Behavior During Therapy [facility] for tasks assessed/performed;Restless             Past Medical History:  Diagnosis Date   Acute respiratory failure with hypoxia (HCC)    Acute rhinosinusitis 07/03/2018   Allergic rhinitis    Anxiety    Asthma    Back pain    Bipolar disorder (HCC)    Borderline personality disorder (HCC)    Bupropion overdose    Chronic pain    Chronic pain syndrome    Depression    Fibromyalgia    generalized   Hallucinations    Migraines    Nasal polyps    Nausea    Neck pain    OCD (obsessive compulsive disorder)    OCD (obsessive compulsive disorder)    Ovarian cyst    left   Pneumonia    2021   PONV (postoperative nausea and vomiting)    Schizophrenia ([place])    schizoaffective    Past Surgical History:  Procedure Laterality Date   ABDOMINAL HYSTERECTOMY     LAPAROSCOPIC TUBAL LIGATION Bilateral 06/13/2019   Procedure: LAPAROSCOPIC TUBAL LIGATION;  Surgeon: Chancy Milroy, MD;  Location: [facility];  Service: Gynecology;  Laterality: Bilateral;  FILSHIE CLIPS   NASAL SINUS SURGERY     TUBAL LIGATION     tubes in ears     VAGINAL HYSTERECTOMY Bilateral 01/15/2020   Procedure: HYSTERECTOMY VAGINAL;  Surgeon: Chancy Milroy, MD;  Location: [facility];  Service: Gynecology;   Laterality: Bilateral;   WISDOM TOOTH EXTRACTION      There were no vitals filed for this visit.   Subjective Assessment - 10/29/20 1806     Subjective Pt with questions in regards to "how long until you think I am able to ..."                                          PT Short Term Goals - 09/03/20 1150       PT SHORT TERM GOAL #1   Title Pt will demonstrate understanding and report adherence to an HEP to independently address her primary impairments in an effort to promote functional ability.    Baseline HEP given at eval    Time 3    Period Weeks    Status Achieved    Target Date 09/04/20               PT Long Term Goals - 10/06/20 1409       PT LONG TERM GOAL #1   Title Pt will demonstrate global BIL ankle MMT of 4/5 or higher in order to promote walking  ability.    Baseline Pt BIL gros ankle MMT 4-/5 to 4+/5    Time 6    Period Weeks    Status Achieved      PT LONG TERM GOAL #2   Title Pt will demonstrate ability to stand >5 minutes in order to progress to showering and toileting independently.    Baseline Pt reports ability to stand 1-2 minutes in // bars    Time 6    Period Weeks    Status On-going      PT LONG TERM GOAL #3   Title Pt will demonstrate ability to walk at self-selected pace for >5 minutes in order to progress to walking her dogs without limitation.    Baseline Pt reports ability to walk 1-2 minutes (17f) with // bars    Time 6    Period Weeks    Status On-going      PT LONG TERM GOAL #4   Title Pt will demonstrate intact hot/cold and course/soft discrimination in BIL feet in order to protect her feet from injury when walking.    Baseline Poor hot/cold and course/soft discrimination in BIL feet    Time 6    Period Weeks    Status On-going      PT LONG TERM GOAL #5   Title Pt will report ability to sit EOB >30 minutes with <5/10 pain in order to sit to watch a movie with minimal limitation.    Baseline  Pt experiences 5-7/10 pain when sitting >15 minutes.    Time 6    Period Weeks    Status On-going            Pt seen for aquatic therapy today.  Treatment took place in water 3.25-4.8 ft in [facility] Temp of water was 91.  Pt entered/exited the pool via lift.   Ambulation with water walker 8 widths 4.8 ft forward and backward.   Prone suspension flutter kicking x 10 minutes     Standing -Step ups on water step x 10 reps leading with each LE forward.  VC and TC for proper execution and safety. -stair climbing on 5 steps pt submerged at the deepest to waist rising until stepping out of thee water.  VC and TC for alternating pattern, weight shift, posture and confidence.  Pt ascends and descends x 2 alternating pattern then x 1 step to pattern   Gait and balance challenges: walking with water walker 4 widths in 4.8 ft. Progressed to noodle 4 widths in 3 ft. Then Hand held assist x 2 lengths.  VC for le advancement and erect posture, using hips to pull le through swing then heel strike. Pt continues with just minimal difficulty with inversion of left foot. No c/o discomfort weight bearing on feet although reports calf pain upon completion.  Attempted heel cord and gastroc stretch but not tolerated. Gentle massage feet and stretching of gr toe into extension.    Multiple recovery periods given.     Pt requires buoyancy for support and to offload joints with strengthening exercises. Viscosity of the water is needed for resistance of strengthening; water current perturbations provides challenge to standing balance unsupported, requiring increased core activation.        Plan - 10/29/20 1806     Clinical Impression Statement Pt tolerating increased weight bearing on feet, she reports pain continues.  I was able to massage feet gently and manuever great toes. She is instructed on desensitizing  techniques (masaage, gentle rubbing with hands, using towel or  silk).  She progressed with stair climbing, rising 5 steps out of the water x 2 alternating step pattern with min cga then x 1 step to pattern.  Pt with good quad strength rising and descending,  VMO strength good gaining full knee extension with each alternating step. Pt thrilled.  Gait training in pool progressing to [place].  Pt and cg instruction on shower transfers. Assisted with choosing proper DME on [employer] and demonstrated technique. Pt encouraged NOT to take a bath yet due to inability to rise from floor/tub. Also assistance on obtaining a bariatric wheeled walker.  Great session today.    Examination-Activity Limitations Bathing;Locomotion Level;Transfers;Bed Mobility;Bend;Sit;Carry;Squat;Dressing;Stairs;Stand;Hygiene/Grooming;Toileting;Lift    PT Treatment/Interventions Aquatic Therapy;ADLs/Self Care Home Management;Biofeedback;Cryotherapy;Electrical Stimulation;DME Instruction;Ultrasound;Moist Heat;Gait training;Stair training;Functional mobility training;Therapeutic activities;Therapeutic exercise;Balance training;Neuromuscular re-education;Manual techniques;Patient/family education;Taping;Passive range of motion;Dry needling;Spinal Manipulations    PT Next Visit Plan stair climbing, floor recovery    PT Home Exercise Plan [id number]             Patient will benefit from skilled therapeutic intervention in order to improve the following deficits and impairments:  Abnormal gait, Decreased activity tolerance, Decreased balance, Decreased mobility, Decreased strength, Impaired sensation, Postural dysfunction, Improper body mechanics, Pain, Impaired UE functional use, Difficulty walking, Decreased endurance  Visit Diagnosis: Other disturbances of skin sensation  Pain in right foot  Muscle weakness (generalized)  Unsteadiness on feet  Pain in left foot     Problem List Patient Active Problem List   Diagnosis Date Noted   Weakness 08/19/2020   Gait abnormality 08/19/2020    Neuropathic pain 08/19/2020   Malnutrition of moderate degree 07/01/2020   Acute renal failure ([place]) 06/30/2020   Non-traumatic rhabdomyolysis    Elevated LFTs    Tardive dyskinesia 11/14/2019   Bipolar I disorder, most recent episode depressed ([place]) 09/13/2019   Social anxiety disorder 09/13/2019   Adjustment disorder with mixed anxiety and depressed mood 07/31/2019   Suicidal ideation    Post-operative state 07/19/2019   Seizures ([place]) 06/08/2019   Bipolar 1 disorder, depressed ([place]) 05/17/2019   Status epilepticus ([place]) 05/17/2019   Intentional benzodiazepine overdose ([place]) 03/29/2019   Intractable chronic 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 status migrainosus 02/08/2019   History of elective abortion 12/28/2018   QT prolongation 07/03/2018   AMS (altered mental status) 07/03/2018   Serum total bilirubin elevated 07/03/2018   Syncope 07/02/2018   Borderline personality disorder ([place]) 04/11/2017   Bipolar I disorder, current or most recent episode manic, with psychotic features ([place]) 04/10/2017   Bipolar disorder ([place]) 04/10/2017   Abnormal MRI of head 04/21/2016   Chronic migraine without aura 03/27/2015   Mild persistent asthma 12/30/2014   Allergic rhinitis due to pollen 12/30/2014   Rhinitis medicamentosa 12/30/2014   Nasal polyposis 12/30/2014    Annamarie Major) Lexee Brashears MPT   10/29/2020, 6:28 PM  [facility] [address] [place], [place], [address] Phone: [phone]   Fax:  [phone]  Name: Carmen Daniels MRN: [id number] Date of Birth: 03-13-1985

## 2020-10-29 NOTE — Progress Notes (Signed)
Appointment start time: 11:17  Appointment end time: 12:16  Patient was seen on 10/29/2020 for nutrition counseling pertaining to disordered eating  Primary care provider: [name], MD Therapist: none reported  ROI: N/A Any other medical team members: Burt Ek (has phone appts once every 3 months) Parents: mom Manuela Schwartz)   Assessment  Mom states pt is a vegetarian and having a hard time eating. Reports stopped eating and drinking most things 04/2020, had a dizzy episode and fell in her bathroom in 06/2020. Reports she has neuropathy in her legs and feet which is painful to her. 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 States she Became vegetarian during teenage years.Pt states she stopped eating 04/2020 and stopped taking bipolar medication because it had caused weight gain and she didn't like it. States she will not take the medications again because she does not want to gain weight and be "fat". Mom reports pt has lost about 50 lbs between March - May 2022. Pt states she has dizziness multiple times a day and headaches daily; currently has a headache during our appt. States pain at night is what wakes her up and she's unable to sleep well.   States she has a Transport planner but its her psychiatric nurse practitioner. Reports meeting with her once every 3 months via phone call.   Pt states she is an extremely picky eater. States she has been vegetarian since being a teenager. Mom reports over time, picky eating has become worse. States she likes pasta, tofu, salad, black beans, rice, PB (sometimes). States she has phases where she will eat a lot of one item and then not want it anymore. Reports there are a lot of things she does not eat. States she doesn't like eating. States she doesn't like drinking Ensure.   Reports states she has some body dysmorphia and when she weighed her lowest weight of 120 lbs she thought she was "fat" when looking in the mirror. States she always sees self as "fat". States she  thinks being skinny is cute and felt good about herself when she was 120 lbs although you could see her bones. Mom reports you could see her bones.   Pt currently lives with parents in the guesthouse in their backyard. Mom reports they do not eat meals together as a family and will deliver food to patient in the [place]. Pt states she sits up when eating meals but lays down when snacking. States she has chewing and swallowing difficulties at times related to this. Mom states she usually eats meals with pt's father.    Growth Metrics: Goal rate of weight gain:  0.5-1.0 lb/week  Eating history: Length of time: 20 years ago when changing to vegetarianism; loves animals and couldn't stand the thought Previous treatments: none  Goals for RD meetings: improve dry skin, dizziness/lightheadedness, headaches, focus/concentration  Weight history:  Highest weight: 230   Lowest weight: 120 Most consistent weight:   What would you like to weigh:120 (if possible) How has weight changed in the past year: weight loss  Medical Information:  Changes in hair, skin, nails since ED started: dry skin Chewing/swallowing difficulties: sometimes hard to swallow Reflux or heartburn: no Trouble with teeth: gum disease LMP without the use of hormones: has had hysterectomy and tubal ligation  Weight at that point: N/A Effect of exercise on menses: N/A   Effect of hormones on menses: N/A Constipation, diarrhea: no, has BM 2-3 days Dizziness/lightheadedness: multiple times a day Headaches/body aches: almost daily Heart racing/chest  pain: sometimes, thinks its related to anxiety Mood: depressed Sleep: wakes up a lot at night Focus/concentration: yes Cold intolerance: no Vision changes: no  Mental health diagnosis:    Dietary assessment: A typical day consists of 1-2 meals and 0-2 snacks  Safe foods include: pasta, tofu, salad, black beans, rice, PB (sometimes) Avoided foods include: meat, milk, eggs,  seafood  24 hour recall:  B (9 am): cereal (Fruity Pebbles) + almond milk S: L (2-3 pm): 6 veggie buffalo wings + ranch  S: D: skipped S:  Beverages: tea, lemonade, water  Physical therapy: 1-2x/week,   What Methods Do You Use To Control Your Weight (Compensatory behaviors)?           Restricting (calories, fat, carbs)  SIV  Diet pills  Laxatives  Diuretics  Alcohol or drugs  Exercise (what type)  Food rules or rituals - does not eat   Binge  Estimated energy intake: ~1000 kcal  Estimated energy needs: 2000-2200 kcal 250-275 g CHO 100-110 g pro 67-73 g fat  Nutrition Diagnosis: NB-1.5 Disordered eating pattern As related to skipping meals.  As evidenced by dietary recall.  Intervention/Goals: Pt and mom were educated and counseled on eating to nourish the body, signs/symptoms of not being adequately nourished, ways to increase nourishment, and meal planning. Pt states she doesn't want to eat and is not interested in a higher level of care. Pt and mom agreed with goals listed. Goals: - Aim to have 1/2 plate of starch/grain + 1/4 plate of lean protein + 1/4 plate of vegetables/fruit + lipid + calcium.  - Aim to have breakfast, lunch, and dinner.  - Have conversation with healthcare providers (medical doctor and therapist) about more frequent follow-ups.  Meal plan:    3 meals    0-3 snacks  Monitoring and Evaluation: Patient states she will not follow-up.

## 2020-10-30 ENCOUNTER — Telehealth (INDEPENDENT_AMBULATORY_CARE_PROVIDER_SITE_OTHER): Payer: Medicaid Other | Admitting: Psychiatry

## 2020-10-30 ENCOUNTER — Encounter (HOSPITAL_COMMUNITY): Payer: Self-pay | Admitting: Psychiatry

## 2020-10-30 DIAGNOSIS — F3130 Bipolar disorder, current episode depressed, mild or moderate severity, unspecified: Secondary | ICD-10-CM

## 2020-10-30 NOTE — Progress Notes (Signed)
[facility] MD/PA/NP OP Progress Note Virtual Visit via Telephone Note  I connected with Carmen Daniels on 10/30/20 at 10:00 AM EDT by telephone and verified that I am speaking with the correct person using two identifiers.  Location: Patient: home Provider: Clinic   I discussed the limitations, risks, security and privacy concerns of performing an evaluation and management service by telephone and the availability of in person appointments. I also discussed with the patient that there may be a patient responsible charge related to this service. The patient expressed understanding and agreed to proceed.   I provided 30 minutes of non-face-to-face time during this encounter.  10/30/2020 11:01 AM Carmen Daniels  MRN:  [id number]  Chief Complaint: "I only want to pursue ECT"    HPI: 35 year-old female seen today for follow-up psychiatric evaluation. She has a psychiatric history of bipolar disorder, borderline personality disorder, anxiety, depression, and SI/SA. She is currently managed on Cymbalta 30 mg daily.  She notes medications are somewhat effective in managing her psychiatric conditions.  Today she was unable to login virtually so her assessment was done over the phone.  During exam she was pleasant, cooperative, and engaged in conversation.  Her voice tone was decreased and her speech was slow.  She informed provider that since her last visit she continues to be depressed.  She notes that she would like to discontinue Cymbalta and is only interested in starting ECT.  Provider asked patient if she had spoken to Dr. Weber Cooks at [facility] and she notes that she had not.  She informed Probation officer that she did not have the number.  Provider gave patient the number to his clinic and advised her to call for consultation.  A PHQ-9 was conducted on 9/7 and patient scored an 18, at her last visit she scored a 15.  Provider also conducted a GAD-7 today and patient scored a 20, at her last visit she scored a 13.  She  endorses adequate sleep and reports that her appetite has been increasing.  She denies recent weight gain/loss.  Today she denies SI/HI/VAH, mania, or paranoia.    Patient informed Probation officer that she still has difficulties walking.  Patient's mother was in on the assessment and she notes that her daughter now ambulates with a walker.  She notes that she has had more of her appetite however is still weak most days.  Ms. Larivee is receiving physical therapy as well as pool therapy.  She notes that she has pain in her feet as well as legs.  Today she would like to discontinue Cymbalta.  She notes that she will reach out to [facility] regarding ECT treatments.  At this time she notes that she will follow-up as needed.  No other concerns noted at this time.    Visit Diagnosis:    ICD-10-CM   1. Bipolar I disorder, most recent episode depressed ([place])  F31.30        Past Psychiatric History: bipolar disorder, borderline personality disorder, anxiety, depression, and SI/SA Past Medical History:  Past Medical History:  Diagnosis Date   Acute respiratory failure with hypoxia (HCC)    Acute rhinosinusitis 07/03/2018   Allergic rhinitis    Anxiety    Asthma    Back pain    Bipolar disorder (HCC)    Borderline personality disorder (HCC)    Bupropion overdose    Chronic pain    Chronic pain syndrome    Depression    Fibromyalgia    generalized  Hallucinations    Migraines    Nasal polyps    Nausea    Neck pain    OCD (obsessive compulsive disorder)    OCD (obsessive compulsive disorder)    Ovarian cyst    left   Pneumonia    2021   PONV (postoperative nausea and vomiting)    Schizophrenia ([place])    schizoaffective    Past Surgical History:  Procedure Laterality Date   ABDOMINAL HYSTERECTOMY     LAPAROSCOPIC TUBAL LIGATION Bilateral 06/13/2019   Procedure: LAPAROSCOPIC TUBAL LIGATION;  Surgeon: Chancy Milroy, MD;  Location: [facility];  Service: Gynecology;  Laterality:  Bilateral;  FILSHIE CLIPS   NASAL SINUS SURGERY     TUBAL LIGATION     tubes in ears     VAGINAL HYSTERECTOMY Bilateral 01/15/2020   Procedure: HYSTERECTOMY VAGINAL;  Surgeon: Chancy Milroy, MD;  Location: [facility];  Service: Gynecology;  Laterality: Bilateral;   WISDOM TOOTH EXTRACTION      Family Psychiatric History: Paternal grandmother depression Family History:  Family History  Problem Relation Age of Onset   Healthy Mother    Healthy Father    Arthritis Maternal Grandmother    Arthritis Maternal Grandfather    Depression Paternal Grandmother    Hyperlipidemia Other    Stroke Other    Hypertension Other    Cancer Other    COPD Other    Asthma Other     Social History:  Social History   Socioeconomic History   Marital status: Married    Spouse name: Not on file   Number of children: 0   Years of education: 14   Highest education level: Not on file  Occupational History   Occupation: Unemployed  Tobacco Use   Smoking status: Every Day    Packs/day: 0.50    Years: 15.00    Pack years: 7.50    Types: Cigarettes   Smokeless tobacco: Never  Vaping Use   Vaping Use: Every day   Substances: Nicotine  Substance and Sexual Activity   Alcohol use: Never   Drug use: Yes    Comment: prescribed narcotics   Sexual activity: Not on file  Other Topics Concern   Not on file  Social History Narrative   Lives with parents.   Right-handed.   3 glasses of green tea per day.   Social Determinants of Radio broadcast assistant Strain: Not on file  Food Insecurity: No Food Insecurity   Worried About Charity fundraiser in the Last Year: Never true   Ran Out of Food in the Last Year: Never true  Transportation Needs: Not on file  Physical Activity: Not on file  Stress: Not on file  Social Connections: Not on file    Allergies:  Allergies  Allergen Reactions   Keflex [Cephalexin] Anaphylaxis    Metabolic Disorder Labs: No results found for: HGBA1C, MPG No  results found for: PROLACTIN Lab Results  Component Value Date   TRIG 341 (H) 04/03/2019   Lab Results  Component Value Date   TSH 4.304 07/23/2020   TSH 4.809 (H) 06/30/2020    Therapeutic Level Labs: Lab Results  Component Value Date   LITHIUM <0.06 (L) 07/30/2019   LITHIUM <0.06 (L) 03/30/2019   No results found for: VALPROATE No components found for:  CBMZ  Current Medications: Current Outpatient Medications  Medication Sig Dispense Refill   beclomethasone (QVAR) 40 MCG/ACT inhaler Inhale 1 puff into the lungs 2 (  two) times daily as needed (asthma).      botulinum toxin Type A (BOTOX) 100 units SOLR injection Inject 200 Units into the muscle every 3 (three) months. Inject into head and neck muscles (Patient not taking: Reported on 09/18/2020) 2 each 3   buprenorphine (SUBUTEX) 2 MG SUBL SL tablet Place under the tongue daily.     clonazePAM (KLONOPIN) 1 MG tablet Take 1 mg by mouth 4 (four) times daily.     DULoxetine (CYMBALTA) 60 MG capsule Take 1 capsule (60 mg total) by mouth daily. 90 capsule 3   Fremanezumab-vfrm (AJOVY) 225 MG/1.5ML SOAJ Inject 225 mg into the skin every 30 (thirty) days. (Patient not taking: Reported on 09/18/2020) 1.5 mL 11   gabapentin (NEURONTIN) 300 MG capsule Take 1 capsule (300 mg total) by mouth 3 (three) times daily as needed. (Patient taking differently: Take 300 mg by mouth 3 (three) times daily as needed (nerve pain).) 90 capsule 0   gabapentin (NEURONTIN) 400 MG capsule Take 400 mg by mouth 3 (three) times daily.     Multiple Vitamins-Calcium (ONE-A-DAY WOMENS FORMULA PO) Take 1 tablet by mouth daily.     OXcarbazepine (TRILEPTAL) 150 MG tablet Take 2 tablets (300 mg total) by mouth 2 (two) times daily. 120 tablet 11   oxyCODONE-acetaminophen (PERCOCET/ROXICET) 5-325 MG tablet Take 1 tablet by mouth every 6 (six) hours as needed for severe pain. (Patient not taking: No sig reported)     SUMAtriptan (IMITREX) 100 MG tablet Take 1 tablet (100 mg  total) by mouth every 2 (two) hours as needed for migraine. MAY DOSE 2 PER DAY OR 3 (Patient not taking: Reported on 09/18/2020) 12 tablet 11   traZODone (DESYREL) 50 MG tablet Take 50 mg by mouth at bedtime as needed for sleep. for sleep     zolpidem (AMBIEN) 10 MG tablet Take 10 mg by mouth at bedtime.     No current facility-administered medications for this visit.     Musculoskeletal: Strength & Muscle Tone:  Unable to assess due to telephone visit Gait & Station:  Unable to assess due to telephone visit Patient leans: N/A  Psychiatric Specialty Exam: Review of Systems  There were no vitals taken for this visit.There is no height or weight on file to calculate BMI.  General Appearance:  Unable to assess due to telephone visit  Eye Contact:   Unable to assess due to telephone visit  Speech:  Clear and Coherent and Slow  Volume:  Decreased  Mood:  Anxious and Depressed  Affect:  Congruent  Thought Process:  Coherent, Goal Directed and Linear  Orientation:  Full (Time, Place, and Person)  Thought Content: WDL and Logical   Suicidal Thoughts:  No  Homicidal Thoughts:  No  Memory:  Immediate;   Good Recent;   Good Remote;   Good  Judgement:  Good  Insight:  Good  Psychomotor Activity:  Normal  Concentration:  Concentration: Good and Attention Span: Good  Recall:  Good  Fund of Knowledge: Good  Language: Good  Akathisia:  No  Handed:  Right  AIMS (if indicated):Done  Assets:  Communication Skills Desire for Improvement Financial Resources/Insurance Housing Social Support  ADL's:  Intact  Cognition: WNL  Sleep:  Good   Screenings: AIMS    Flowsheet Row Clinical Support from 11/26/2019 in [facility] Admission (Discharged) from 04/10/2017 in [facility] [facility]  AIMS Total Score 5 0      GAD-7  Flowsheet Row Video Visit from 10/30/2020 in [facility] Video Visit from 08/11/2020 in  [place] from 03/31/2020 in [facility]  Total GAD-7 Score '20 13 18      '$ Mini-Mental    [place] Office Visit from 10/05/2016 in [facility]  Total Score (max 30 points ) 26      PHQ2-9    Flowsheet Row Nutrition [facility] from 08/11/2020 in [facility] ED from 08/05/2020 in [place] [facility] Clinical Support from 03/31/2020 in [facility] ED from 09/06/2019 in [facility]  PHQ-2 Total Score '3 4 4 6 1  '$ PHQ-9 Total Score '18 15 8 19 '$ --      Flowsheet Row ED from 09/18/2020 in [place] ED from 09/16/2020 in [place] Video Visit from 08/11/2020 in [facility] No Risk No Risk No Risk        Assessment and Plan: Patient endorse anxiety and depression. Today she would like to discontinue Cymbalta.  She notes that she will reach out to [facility] regarding ECT treatments.  At this time she notes that she will follow-up as needed  1. Bipolar 1 disorder, depressed ([place])      Follow-up in 2 months. Follow-up with outpatient therapy   Salley Slaughter, NP 10/30/2020, 11:01 AM

## 2020-11-05 ENCOUNTER — Other Ambulatory Visit: Payer: Self-pay

## 2020-11-05 ENCOUNTER — Ambulatory Visit: Payer: Medicaid Other | Attending: Internal Medicine

## 2020-11-05 DIAGNOSIS — M79672 Pain in left foot: Secondary | ICD-10-CM | POA: Diagnosis present

## 2020-11-05 DIAGNOSIS — R208 Other disturbances of skin sensation: Secondary | ICD-10-CM | POA: Insufficient documentation

## 2020-11-05 DIAGNOSIS — M6281 Muscle weakness (generalized): Secondary | ICD-10-CM | POA: Diagnosis present

## 2020-11-05 DIAGNOSIS — R2681 Unsteadiness on feet: Secondary | ICD-10-CM | POA: Diagnosis present

## 2020-11-05 DIAGNOSIS — M79671 Pain in right foot: Secondary | ICD-10-CM | POA: Diagnosis present

## 2020-11-05 NOTE — Therapy (Signed)
[place], [place], [address] Phone: [phone]   Fax:  [phone]  Physical Therapy Treatment  Patient Details  Name: Carmen Daniels MRN: [id number] Date of Birth: 02/07/1986 Referring Provider (PT): [name]   Encounter Date: 11/05/2020   PT End of Session - 11/05/20 1334     Visit Number 18    Number of Visits 26    Date for PT Re-Evaluation 12/29/20    Authorization Type [place] MCD    Authorization Time Period Approved for 12 visits from 10/29/2020 - 01/20/2021    Authorization - Visit Number 2    Authorization - Number of Visits 12    PT Start Time [id number]    PT Stop Time 1400    PT Time Calculation (min) 45 min    Equipment Utilized During Treatment Gait belt;Other (comment)    Activity Tolerance Patient limited by pain;Patient tolerated treatment well    Behavior During Therapy [facility] for tasks assessed/performed;Restless             Past Medical History:  Diagnosis Date   Acute respiratory failure with hypoxia (HCC)    Acute rhinosinusitis 07/03/2018   Allergic rhinitis    Anxiety    Asthma    Back pain    Bipolar disorder (HCC)    Borderline personality disorder (HCC)    Bupropion overdose    Chronic pain    Chronic pain syndrome    Depression    Fibromyalgia    generalized   Hallucinations    Migraines    Nasal polyps    Nausea    Neck pain    OCD (obsessive compulsive disorder)    OCD (obsessive compulsive disorder)    Ovarian cyst    left   Pneumonia    2021   PONV (postoperative nausea and vomiting)    Schizophrenia ([place])    schizoaffective    Past Surgical History:  Procedure Laterality Date   ABDOMINAL HYSTERECTOMY     LAPAROSCOPIC TUBAL LIGATION Bilateral 06/13/2019   Procedure: LAPAROSCOPIC TUBAL LIGATION;  Surgeon: Chancy Milroy, MD;  Location: [place];  Service: Gynecology;  Laterality: Bilateral;  FILSHIE CLIPS   NASAL SINUS SURGERY      TUBAL LIGATION     tubes in ears     VAGINAL HYSTERECTOMY Bilateral 01/15/2020   Procedure: HYSTERECTOMY VAGINAL;  Surgeon: Chancy Milroy, MD;  Location: [place];  Service: Gynecology;  Laterality: Bilateral;   WISDOM TOOTH EXTRACTION      There were no vitals filed for this visit.   Subjective Assessment - 11/05/20 1310     Subjective Pt reports that she was able to walk up the steps in pool therapy without limitation last week. She also reports that she has an infection in her L toenail, for which she has been referred to podiatry. She reports that walking has been very painful due to the infection. She also states that she bought a bariatric walker last week and has been using it for household ambulation. She reports she plans on bringing it to future appointments.    Patient is accompained by: Family member   Mother   Currently in Pain? Yes    Pain Score 9     Pain Location Foot    Pain Orientation Right;Left    Pain Descriptors / Indicators Burning;Throbbing  Early Adult PT Treatment/Exercise - 11/05/20 0001       Transfers   Transfers Stand to Sit;Sit to Stand    Sit to Stand 6: Modified independent (Device/Increase time)   x5   Stand to Sit 6: Modified independent (Device/Increase time)   x5     Ambulation/Gait   Ambulation/Gait Yes    Ambulation/Gait Assistance 6: Modified independent (Device/Increase time)    Ambulation Distance (Feet) 120 Feet    Assistive device Parallel bars    Gait Pattern Step-through pattern    Gait Comments With yellow obstacles for foot clearance      Exercises   Exercises Knee/Hip      Knee/Hip Exercises: Standing   Hip Abduction Stengthening    Abduction Limitations --   2x10 BIL with YTB in // bars   Hip Extension Stengthening    Extension Limitations --   2x10 BIL with YTB in // bars     Knee/Hip Exercises: Seated   Long Probation officer    Long Arc Quad Weight 2 lbs.    Long  Arc Quad Limitations 3x8 BIL                     PT Education - 11/05/20 1333     Education Details Updated HEP, re-educated on how to adjust air splint per pt request    Person(s) Educated Patient;Parent(s)    Methods Explanation;Demonstration;Handout    Comprehension Verbalized understanding;Returned demonstration              PT Short Term Goals - 09/03/20 1150       PT SHORT TERM GOAL #1   Title Pt will demonstrate understanding and report adherence to an HEP to independently address her primary impairments in an effort to promote functional ability.    Baseline HEP given at eval    Time 3    Period Weeks    Status Achieved    Target Date 09/04/20               PT Long Term Goals - 10/06/20 1409       PT LONG TERM GOAL #1   Title Pt will demonstrate global BIL ankle MMT of 4/5 or higher in order to promote walking ability.    Baseline Pt BIL gros ankle MMT 4-/5 to 4+/5    Time 6    Period Weeks    Status Achieved      PT LONG TERM GOAL #2   Title Pt will demonstrate ability to stand >5 minutes in order to progress to showering and toileting independently.    Baseline Pt reports ability to stand 1-2 minutes in // bars    Time 6    Period Weeks    Status On-going      PT LONG TERM GOAL #3   Title Pt will demonstrate ability to walk at self-selected pace for >5 minutes in order to progress to walking her dogs without limitation.    Baseline Pt reports ability to walk 1-2 minutes (32f) with // bars    Time 6    Period Weeks    Status On-going      PT LONG TERM GOAL #4   Title Pt will demonstrate intact hot/cold and course/soft discrimination in BIL feet in order to protect her feet from injury when walking.    Baseline Poor hot/cold and course/soft discrimination in BIL feet    Time 6    Period Weeks  Status On-going      PT LONG TERM GOAL #5   Title Pt will report ability to sit EOB >30 minutes with <5/10 pain in order to sit to watch  a movie with minimal limitation.    Baseline Pt experiences 5-7/10 pain when sitting >15 minutes.    Time 6    Period Weeks    Status On-going                   Plan - 11/05/20 1409     Clinical Impression Statement Pt responded well to all intervention today, demonstrating improved standing tolerance as indicated by the pt's ability to perform standing hip extension and abduction. She also demonstrates improved functional anterior tibialis strength as she was able to perform ambulation with foot clearance over obstancles with good form. She will continue to benefit from skilled PT to address her primary impairments and return to her prior level of function without limitation.    Personal Factors and Comorbidities Comorbidity 3+    Comorbidities See medical history above    Examination-Activity Limitations Bathing;Locomotion Level;Transfers;Bed Mobility;Bend;Sit;Carry;Squat;Dressing;Stairs;Stand;Hygiene/Grooming;Toileting;Lift    Examination-Participation Restrictions Cleaning;Community Activity;Driving;Laundry    Stability/Clinical Decision Making Evolving/Moderate complexity    Clinical Decision Making Moderate    Rehab Potential Good    PT Frequency 1x / week    PT Duration 12 weeks    PT Treatment/Interventions Aquatic Therapy;ADLs/Self Care Home Management;Biofeedback;Cryotherapy;Electrical Stimulation;DME Instruction;Ultrasound;Moist Heat;Gait training;Stair training;Functional mobility training;Therapeutic activities;Therapeutic exercise;Balance training;Neuromuscular re-education;Manual techniques;Patient/family education;Taping;Passive range of motion;Dry needling;Spinal Manipulations    PT Next Visit Plan stair climbing, floor recovery, gross LE strengthening, gait training using walker, re-assess goals*    PT Home Exercise Plan [id number]    Consulted and Agree with Plan of Care Patient             Patient will benefit from skilled therapeutic intervention in order to  improve the following deficits and impairments:  Abnormal gait, Decreased activity tolerance, Decreased balance, Decreased mobility, Decreased strength, Impaired sensation, Postural dysfunction, Improper body mechanics, Pain, Impaired UE functional use, Difficulty walking, Decreased endurance  Visit Diagnosis: Other disturbances of skin sensation  Pain in right foot  Muscle weakness (generalized)  Unsteadiness on feet  Pain in left foot     Problem List Patient Active Problem List   Diagnosis Date Noted   Weakness 08/19/2020   Gait abnormality 08/19/2020   Neuropathic pain 08/19/2020   Malnutrition of moderate degree 07/01/2020   Acute renal failure ([place]) 06/30/2020   Non-traumatic rhabdomyolysis    Elevated LFTs    Tardive dyskinesia 11/14/2019   Bipolar I disorder, most recent episode depressed ([place]) 09/13/2019   Social anxiety disorder 09/13/2019   Adjustment disorder with mixed anxiety and depressed mood 07/31/2019   Suicidal ideation    Post-operative state 07/19/2019   Seizures ([place]) 06/08/2019   Bipolar 1 disorder, depressed ([place]) 05/17/2019   Status epilepticus ([place]) 05/17/2019   Intentional benzodiazepine overdose ([place]) 03/29/2019   Intractable chronic 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 status migrainosus 02/08/2019   History of elective abortion 12/28/2018   QT prolongation 07/03/2018   AMS (altered mental status) 07/03/2018   Serum total bilirubin elevated 07/03/2018   Syncope 07/02/2018   Borderline personality disorder ([place]) 04/11/2017   Bipolar I disorder, current or most recent episode manic, with psychotic features ([place]) 04/10/2017   Bipolar disorder ([place]) 04/10/2017   Abnormal MRI of head 04/21/2016   Chronic migraine without aura 03/27/2015   Mild persistent asthma 12/30/2014   Allergic rhinitis  due to pollen 12/30/2014   Rhinitis medicamentosa 12/30/2014   Nasal polyposis 12/30/2014    Carmen Daniels, PT, DPT 11/05/20 2:16 PM   [facility] [address] [place], [place], [address] Phone: [phone]   Fax:  [phone]  Name: YVONNIE BARBIE MRN: [id number] Date of Birth: Oct 06, 1985

## 2020-11-05 NOTE — Patient Instructions (Addendum)
   [id number]

## 2020-11-13 ENCOUNTER — Ambulatory Visit: Payer: Medicaid Other

## 2020-11-13 ENCOUNTER — Other Ambulatory Visit: Payer: Self-pay

## 2020-11-13 DIAGNOSIS — M79671 Pain in right foot: Secondary | ICD-10-CM

## 2020-11-13 DIAGNOSIS — M79672 Pain in left foot: Secondary | ICD-10-CM

## 2020-11-13 DIAGNOSIS — R208 Other disturbances of skin sensation: Secondary | ICD-10-CM

## 2020-11-13 DIAGNOSIS — R2681 Unsteadiness on feet: Secondary | ICD-10-CM

## 2020-11-13 DIAGNOSIS — M6281 Muscle weakness (generalized): Secondary | ICD-10-CM

## 2020-11-13 NOTE — Therapy (Signed)
[place], [place], [address] Phone: [phone]   Fax:  [phone]  Physical Therapy Treatment  Patient Details  Name: Carmen Daniels MRN: [id number] Date of Birth: 09-26-1985 Referring Provider (PT): [name]   Encounter Date: 11/13/2020   PT End of Session - 11/13/20 1306     Visit Number 19    Number of Visits 26    Date for PT Re-Evaluation 12/29/20    Authorization Type [place] MCD    Authorization Time Period Approved for 12 visits from 10/29/2020 - 01/20/2021    Authorization - Visit Number 3    Authorization - Number of Visits 12    PT Start Time 7209    PT Stop Time 4709    PT Time Calculation (min) 50 min    Equipment Utilized During Treatment Gait belt;Other (comment)    Activity Tolerance Patient limited by pain;Patient tolerated treatment well    Behavior During Therapy [facility] for tasks assessed/performed;Restless             Past Medical History:  Diagnosis Date   Acute respiratory failure with hypoxia (HCC)    Acute rhinosinusitis 07/03/2018   Allergic rhinitis    Anxiety    Asthma    Back pain    Bipolar disorder (HCC)    Borderline personality disorder (HCC)    Bupropion overdose    Chronic pain    Chronic pain syndrome    Depression    Fibromyalgia    generalized   Hallucinations    Migraines    Nasal polyps    Nausea    Neck pain    OCD (obsessive compulsive disorder)    OCD (obsessive compulsive disorder)    Ovarian cyst    left   Pneumonia    2021   PONV (postoperative nausea and vomiting)    Schizophrenia ([place])    schizoaffective    Past Surgical History:  Procedure Laterality Date   ABDOMINAL HYSTERECTOMY     LAPAROSCOPIC TUBAL LIGATION Bilateral 06/13/2019   Procedure: LAPAROSCOPIC TUBAL LIGATION;  Surgeon: Chancy Milroy, MD;  Location: [place];  Service: Gynecology;  Laterality: Bilateral;  FILSHIE CLIPS   NASAL SINUS SURGERY      TUBAL LIGATION     tubes in ears     VAGINAL HYSTERECTOMY Bilateral 01/15/2020   Procedure: HYSTERECTOMY VAGINAL;  Surgeon: Chancy Milroy, MD;  Location: [place];  Service: Gynecology;  Laterality: Bilateral;   WISDOM TOOTH EXTRACTION      There were no vitals filed for this visit.   Subjective Assessment - 11/13/20 1214     Subjective Pt reports doing well with her exercises at home, walking in her home-made // bars daily. She rates her pain as a 8/10. Patient asks for help setting up her personal TENS device.    Patient is accompained by: Family member   mother   Limitations Sitting;Lifting;Standing;Walking;Writing;House hold activities    How long can you sit comfortably? 20 minutes    How long can you stand comfortably? 10 minutes with UE support    How long can you walk comfortably? 600 ft in // bars (60 laps in homemade // bars without rest)    Diagnostic tests 08/27/2020 NCV study with EMG- Results: This is an abnormal study, there is electrodiagnostic evidence of myopathic changes involving bilateral lower extremity below knee, most consistent with her history of severe rhabdomyolysis.  The abnormalities are limited to below knee  muscles, she also have evidence of mild atrophy, significant myalgia at lower extremity compartment.  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    Patient Stated Goals Pt would like to be able to walk again without pain. She would also like to get back to swimming, driving, and bike riding, as well as playing with her dogs.    Currently in Pain? Yes    Pain Score 8     Pain Location Foot    Pain Orientation Right;Left    Pain Descriptors / Indicators Burning;Throbbing    Pain Type Chronic pain    Pain Onset More than a month ago    Pain Frequency Constant                [facility] PT Assessment -  11/13/20 0001       Sensation   Light Touch Appears Intact    Hot/Cold Impaired Detail    Hot/Cold Impaired Details Impaired RLE;Impaired LLE   Able to distinguish cold, unable to distinguish warm   Additional Comments Course, soft discrimination WNL BIL plantar feet      Strength   Right Ankle Dorsiflexion 4+/5    Right Ankle Plantar Flexion 4/5    Left Ankle Dorsiflexion 4+/5    Left Ankle Plantar Flexion 4/5      6 minute walk test results    Aerobic Endurance Distance Walked 435    Endurance additional comments no seated rests                           [facility] Adult PT Treatment/Exercise - 11/13/20 0001       Transfers   Transfers Stand to Sit;Sit to Stand    Sit to Stand 6: Modified independent (Device/Increase time)   x4   Stand to Sit 6: Modified independent (Device/Increase time)   x4     Ambulation/Gait   Ambulation/Gait Yes    Ambulation/Gait Assistance 6: Modified independent (Device/Increase time)    Ambulation Distance (Feet) 435 Feet    Assistive device Rolling walker    Gait Pattern Step-through pattern      Self-Care   Self-Care Other Self-Care Comments   Detailed instruction on how to set up/ use personal TENS unit                    PT Education - 11/13/20 1305     Education Details Detailed instruction given on how to set up personal TENS unit. Also discussed at length the pt's objective progress made thus far in PT as indicated by improved functional ability, hot/cold and course/ soft discimination, and ankle strength. Informed pt that treatment moving forward would focus on higher level balance and functional gait without and AD.    Person(s) Educated Patient;Parent(s)    Methods Explanation;Demonstration    Comprehension Verbalized understanding;Returned demonstration              PT Short Term Goals - 09/03/20 1150       PT SHORT TERM GOAL #1   Title Pt will demonstrate understanding and report adherence to an  HEP to independently address her primary impairments in an effort to promote functional ability.    Baseline HEP given at eval    Time 3    Period Weeks    Status Achieved    Target Date  09/04/20               PT Long Term Goals - 11/13/20 1311       PT LONG TERM GOAL #1   Title Pt will demonstrate global BIL ankle MMT of 4/5 or higher in order to promote walking ability.    Baseline Pt BIL gros ankle MMT 4-/5 to 4+/5    Time 6    Period Weeks    Status Achieved      PT LONG TERM GOAL #2   Title Pt will demonstrate ability to stand >5 minutes in order to progress to showering and toileting independently.    Baseline Pt reports ability to stand >10 minutes with UE support    Time 6    Period Weeks    Status Achieved      PT LONG TERM GOAL #3   Title Pt will demonstrate ability to walk at self-selected pace for >5 minutes in order to progress to walking her dogs without limitation.    Baseline Pt walked 6 minutes today (435 ft) without a rest break (11/13/2020)    Time 6    Period Weeks    Status Achieved      PT LONG TERM GOAL #4   Title Pt will demonstrate intact hot/cold and course/soft discrimination in BIL feet in order to protect her feet from injury when walking.    Baseline Intact course/soft discrimination in BIL feet, pt able to discriminate cold, but less successful with hot discrimination BIL    Time 6    Period Weeks    Status Achieved      PT LONG TERM GOAL #5   Title Pt will report ability to sit EOB >30 minutes with <5/10 pain in order to sit to watch a movie with minimal limitation.    Baseline Pt experiences slight increase in baseline pain after 20 minutes of sitting    Time 6    Period Weeks    Status On-going      Additional Long Term Goals   Additional Long Term Goals Yes      PT LONG TERM GOAL #6   Title 11/13/2020: Pt will demonstrate ability to walk 6 minutes at a self-selected pace without an AD in order to grocery shop with less  limitation.    Baseline Pt has not attempted to walk without AD    Time 6    Period Weeks    Status New    Target Date 12/25/20      PT LONG TERM GOAL #7   Title Pt will demonstrate ability to perform 5 double leg heel raises in order to reach into overhead cabinets without limitation.    Baseline Unable to perform heel raise    Time 6    Period Weeks    Status New    Target Date 12/25/20                   Plan - 11/13/20 1309     Clinical Impression Statement Upon re-assessment of pt's objective measures and rehab goals, the pt has met or nearly met all of her goals of PT. She demonstrates greatly improved functional gait using a FWW, improved BIL ankle strength, improve soft/ course and hot/ cold discrimination, and subjective reports of improved sitting and standing tolerance. Treatment moving forward will focus on higher level balance and functional gait without an AD. The pt will continue to benefit from skilled PT to address her  primary impairments and return to her prior level of function without limitation.    Personal Factors and Comorbidities Comorbidity 3+    Comorbidities See medical history above    Examination-Activity Limitations Bathing;Locomotion Level;Transfers;Bed Mobility;Bend;Sit;Carry;Squat;Dressing;Stairs;Stand;Hygiene/Grooming;Toileting;Lift    Examination-Participation Restrictions Cleaning;Community Activity;Driving;Laundry    Stability/Clinical Decision Making Evolving/Moderate complexity    Clinical Decision Making Moderate    Rehab Potential Good    PT Frequency 1x / week    PT Duration 12 weeks    PT Treatment/Interventions Aquatic Therapy;ADLs/Self Care Home Management;Biofeedback;Cryotherapy;Electrical Stimulation;DME Instruction;Ultrasound;Moist Heat;Gait training;Stair training;Functional mobility training;Therapeutic activities;Therapeutic exercise;Balance training;Neuromuscular re-education;Manual techniques;Patient/family  education;Taping;Passive range of motion;Dry needling;Spinal Manipulations    PT Next Visit Plan stair climbing, floor recovery, gross LE strengthening, gait training using walker, balance exercises    PT Home Exercise Plan [id number]    Consulted and Agree with Plan of Care Patient             Patient will benefit from skilled therapeutic intervention in order to improve the following deficits and impairments:  Abnormal gait, Decreased activity tolerance, Decreased balance, Decreased mobility, Decreased strength, Impaired sensation, Postural dysfunction, Improper body mechanics, Pain, Impaired UE functional use, Difficulty walking, Decreased endurance  Visit Diagnosis: Other disturbances of skin sensation  Pain in right foot  Muscle weakness (generalized)  Unsteadiness on feet  Pain in left foot     Problem List Patient Active Problem List   Diagnosis Date Noted   Weakness 08/19/2020   Gait abnormality 08/19/2020   Neuropathic pain 08/19/2020   Malnutrition of moderate degree 07/01/2020   Acute renal failure ([place]) 06/30/2020   Non-traumatic rhabdomyolysis    Elevated LFTs    Tardive dyskinesia 11/14/2019   Bipolar I disorder, most recent episode depressed ([place]) 09/13/2019   Social anxiety disorder 09/13/2019   Adjustment disorder with mixed anxiety and depressed mood 07/31/2019   Suicidal ideation    Post-operative state 07/19/2019   Seizures ([place]) 06/08/2019   Bipolar 1 disorder, depressed ([place]) 05/17/2019   Status epilepticus ([place]) 05/17/2019   Intentional benzodiazepine overdose ([place]) 03/29/2019   Intractable chronic 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 status migrainosus 02/08/2019   History of elective abortion 12/28/2018   QT prolongation 07/03/2018   AMS (altered mental status) 07/03/2018   Serum total bilirubin elevated 07/03/2018   Syncope 07/02/2018   Borderline personality disorder ([place]) 04/11/2017   Bipolar I disorder, current or most recent episode  manic, with psychotic features ([place]) 04/10/2017   Bipolar disorder ([place]) 04/10/2017   Abnormal MRI of head 04/21/2016   Chronic migraine without aura 03/27/2015   Mild persistent asthma 12/30/2014   Allergic rhinitis due to pollen 12/30/2014   Rhinitis medicamentosa 12/30/2014   Nasal polyposis 12/30/2014    Vanessa [place], PT, DPT 11/13/20 1:16 PM   [facility] [address] [place], [place], [address] Phone: [phone]   Fax:  [phone]  Name: Carmen Daniels MRN: [id number] Date of Birth: 1985/11/08

## 2020-11-19 ENCOUNTER — Other Ambulatory Visit: Payer: Self-pay

## 2020-11-19 ENCOUNTER — Encounter: Payer: Self-pay | Admitting: Neurology

## 2020-11-19 ENCOUNTER — Ambulatory Visit: Payer: Medicaid Other | Admitting: Neurology

## 2020-11-19 VITALS — BP 107/74 | HR 84

## 2020-11-19 DIAGNOSIS — R531 Weakness: Secondary | ICD-10-CM | POA: Diagnosis not present

## 2020-11-19 DIAGNOSIS — M792 Neuralgia and neuritis, unspecified: Secondary | ICD-10-CM

## 2020-11-19 DIAGNOSIS — G43719 Chronic migraine without aura, intractable, without status migrainosus: Secondary | ICD-10-CM

## 2020-11-19 DIAGNOSIS — F319 Bipolar disorder, unspecified: Secondary | ICD-10-CM

## 2020-11-19 DIAGNOSIS — R269 Unspecified abnormalities of gait and mobility: Secondary | ICD-10-CM

## 2020-11-19 NOTE — Progress Notes (Signed)
ASSESSMENT AND PLAN  Carmen Daniels is a 35 y.o. female   Acute onset of bilateral distal leg weakness, neuropathic pain following her prolonged loss of consciousness, rhabdomyolysis on May 8th 2022  This happened in the setting of few weeks history of decreased intake, concerning about weight gain, abrupt stop of her antidepression medications, also fell due to syncope, stayed unconsciousness lying on the floor for 8 hours, then crawled back to bed for 2 days, without eating solid food, only drinking small amount of liquid  Upon hospital presentation, significantly elevated CPK of more than 26,000, liver and kidney failure, quick recovery with hydration, repeat laboratory evaluation in June 2022 showed no significant abnormality  EMG nerve conduction study August 27, 2020 showed myopathic findings mainly involving below knee muscles, with well-preserved motor unit potential, also evidence of mild axonal peripheral neuropathy, above findings most suggestive of rhabdomyolysis involving lower extremity muscles, likely compartment syndrome, compromise of vascular supply, distal nerve damage  Hope she continue to improve with physical therapy, emphasized importance of well nutrition, well hydration    Neuropathic pain  Keep Cymbalta to 60 mg daily, and Trileptal titrating to 150 mg 2 tablets twice a day  Chronic migraine headaches  Botox injection as migraine prevention (see separate injection note)  Was on Ajovy in the past, reported no longer taking it  Imitrex 100 mg as needed   DIAGNOSTIC DATA (LABS, IMAGING, TESTING) - I reviewed patient records, labs, notes, testing and imaging myself where available. MRI on July 23 2020 Mild lower lumbar facet arthrosis with left foraminal annular fissure at L4-5. This could irritate the left L4 nerve root. No foraminal stenosis.   MRI thoracic: Normal thoracic spine.  CT head and cervical on May 8th 2022: No acute intracranial abnormality  noted.   CT of the cervical spine: No acute bony abnormality is noted. Mild degenerative changes are seen as well as findings suggestive of muscular spasm.    CT abdomen on August 06 2020 No acute or inflammatory process identified in the abdomen or pelvis.   Normal appendix.  Surgically absent uterus.  Laboratory: CPK upon admission was 4925, 18 on August 06, 2020, CBC, Wbc 11, Hg 15.7, Hg 13.6, CMP, creat 0.87, TSH 4.3,   HISTORICAL Carmen Daniels has of being a patient of our office for many years for chronic migraine headaches, she also has history of bipolar disorder, suicidal attempt in the past, hospital discharge in February 2021 for polypharmacy overdose, status epilepticus   She was admitted to the hospital on March 28, 2019 with intentional overdose, this including Wellbutrin, Ambien, Flexeril, then she was noted to have intermittent whole body shaking, progressed to have seizure-like activity, was intubated on March 29, 2019, was loaded with Versed, Dilantin, she was noted to have 10 to 15 seconds of seizure activity followed by quiescence.  Then she was loaded with IV Keppra, midazolam, followed by propofol drip, which stopped the clinical seizure activity, later continuous EEG showed diffuse continuous slowing, system with severe encephalopathy, she was seen by epileptologist Dr. Hortense Ramal on April 05, 2019, her seizure were likely provoked in the setting of Wellbutrin overdose, most likely she will not require long-term antiemetic medications, continue seizure precaution, gradually wean off Keppra,   Her hospital course was also complicated by aspiration pneumonia, right hand cellulitis, was treated with Levaquin, Flagyl.  There was evidence of prolonged QTC, likely due to Wellbutrin,   MRI of the brain on April 04, 2019 showed  no acute abnormality, chronic arachnoid cyst at the right cranial fossa,   Continuous EEG monitoring showed severe diffuse encephalopathy, continuous  generalized slowing,   Laboratory evaluation on March 28, 2019 showed positive for benzodiazepine, lithium level was 0.11, plasma lactic acid was 4.1,   Most recent laboratory evaluation in February 2021, CBC showed mild anemia hemoglobin of 11.3, BMP showed no significant abnormalities   She previously on polypharmacy for her mood disorder, now only on Zyprexa 50 mg at bedtime   For a while she compalins of short-term memory loss, had no recurrent seizure-like activity, has moved in with her parents, also complains of increased migraine headaches,  She began to receive Botox injection as migraine prevention since March 2017,    Her EEG was normal on May 31, 2019.  She does not have recurrent seizure, she did have status epilepticus during her hospital admission on March 28, 2019 due to polypharmacy overdose, she is not taking Keppra 750 mg twice a day, I have suggested her for total treatment of 6 months, stepping down to Keppra 500 mg for 2 months, then 250 mg for 2 months, then stop,  UPDATE June 28th 2022: She is accompanied by her mother for evaluation of bilateral distal leg weakness, Botox injection as migraine prevention  She stopped all of her psychiatric medications before hospital admission on May 8th, 2022, she was found down at home staying on floor for about 8 hours, she was concerned about her weight gain, restricting her intake to very little fluid, almost no solid food days prior to that.  She was able to crawl to her bathroom, was in bed for 2 days, prior to coming to emergency room, noticed weakness in legs, pain all over, elevated CPK 26,755,  UDS was positive for benzodiazepine, creatinine of 4.52 from baseline 0.8, AST 1362, ALT of 1510,   She was diagnosed with acute renal failure secondary to rhabdomyolysis, and liver injury, with consistent bilateral lower extremity neuropathic pain, distal leg weakness, bilateral foot drop since then, moderate malnutrition, was  discharged home to her parents  Since that event, she continues to have moderate to severe bilateral foot, distal leg pain, stabbing, burning, cannot bear weight, profound weakness, no longer ambulatory,  Also complains of worsening headaches, taking gabapentin 300 3 times daily for fibromyalgia, Percocet 5/325 mg 4 times a day for body achy pain, Imitrex about once a week as needed for headaches, denies bowel and bladder incontinence, denies upper extremity paresthesia or weakness, complains diffuse body achy pain, difficulty sleeping, taking Ambien 10 mg every night, despite that, difficulty staying sleep  UPDATE July 6th 2022:  Returns with her mother for electrodiagnostic study today, which showed evidence of myopathic changes mainly involving muscles below knee, right worse than left, there was also normal morphology and motor unit potential mixed with some complex smaller motor unit potential, with slightly decreased recruitment patterns.  Evidence of mild axonal peripheral neuropathy  Needle examination of more proximal muscles of bilateral lower extremities (above knee muscles) were essentially normal  Above findings support the diagnosis of rhabdomyolysis induced distal lower extremity muscle damage, likely compartment syndrome induced distal nerve damage with compromised blood supply,   Patient also made some progress in physical therapy, less pain, able to take few steps, she has been compliant with Cymbalta 60 mg daily   Repeat laboratory evaluation in June 2022 showed normal CPK, CBC, CMP, TSH,  Update November 19, 2020, She overall showed some improvement, can ambulate better with  physical therapy, continue have significant bilateral foot, distal leg pain, frequent migraine headache,  Return for Botox injection as migraine prevention, continue to be on polypharmacy for her mood disorder, Trileptal 300 twice a day, clonazepam 1 mg 4 times a day, Subutex 2 mg daily, trazodone 50  mg at bedtime, Ambien 10 mg at bedtime   PHYSICAL EXAM:   Vitals:   11/19/20 1315  BP: 107/74  Pulse: 84    Not recorded     There is no height or weight on file to calculate BMI.  PHYSICAL EXAMNIATION:  Gen: NAD, conversant, well nourised, well groomed            NEUROLOGICAL EXAM:  MENTAL STATUS: Speech/cognition: Awake, alert, oriented to history and casual conversation   CRANIAL NERVES: CN II: Visual fields are full to confrontation. Pupils are round equal and briskly reactive to light. CN III, IV, VI: extraocular movement are normal. No ptosis. CN V: Facial sensation is intact to light touch CN VII: Face is symmetric with normal eye closure  CN VIII: Hearing is normal to causal conversation. CN IX, X: Phonation is normal. CN XI: Head turning and shoulder shrug are intact  MOTOR: Bilateral upper extremity muscle tone and strength was normal  Significant tenderness at lower extremity below knee level, with evidence of muscle atrophy  LE Hip Flexion Knee flexion Knee extension Ankle Dorsiflexion Eversion Ankle plantar Flexion Inversion  R 5 5 5 3 3 4 4   L 5 5 5  3+ 3+ 4 4      REFLEXES: Reflexes are 3 and symmetric at the biceps, triceps, knees, and absent at ankles. Plantar responses are flexor bilaterally  SENSORY: Intact to light touch, pinprick and vibratory sensation are intact in fingers and toes.    COORDINATION: There is no trunk or limb dysmetria noted.  GAIT/STANCE: Deferred.  REVIEW OF SYSTEMS:  Full 14 system review of systems performed and notable only for as above All other review of systems were negative.   ALLERGIES: Allergies  Allergen Reactions   Keflex [Cephalexin] Anaphylaxis    HOME MEDICATIONS: Current Outpatient Medications  Medication Sig Dispense Refill   beclomethasone (QVAR) 40 MCG/ACT inhaler Inhale 1 puff into the lungs 2 (two) times daily as needed (asthma).      botulinum toxin Type A (BOTOX) 100 units SOLR  injection Inject 200 Units into the muscle every 3 (three) months. Inject into head and neck muscles 2 each 3   buprenorphine (SUBUTEX) 2 MG SUBL SL tablet Place under the tongue daily.     clonazePAM (KLONOPIN) 1 MG tablet Take 1 mg by mouth 4 (four) times daily.     gabapentin (NEURONTIN) 400 MG capsule Take 400 mg by mouth 3 (three) times daily.     Multiple Vitamins-Calcium (ONE-A-DAY WOMENS FORMULA PO) Take 1 tablet by mouth daily.     OXcarbazepine (TRILEPTAL) 150 MG tablet Take 2 tablets (300 mg total) by mouth 2 (two) times daily. 120 tablet 11   traZODone (DESYREL) 50 MG tablet Take 50 mg by mouth at bedtime as needed for sleep. for sleep     zolpidem (AMBIEN) 10 MG tablet Take 10 mg by mouth at bedtime.     No current facility-administered medications for this visit.    PAST MEDICAL HISTORY: Past Medical History:  Diagnosis Date   Acute respiratory failure with hypoxia (HCC)    Acute rhinosinusitis 07/03/2018   Allergic rhinitis    Anxiety    Asthma    Back  pain    Bipolar disorder ([place])    Borderline personality disorder ([place])    Bupropion overdose    Chronic pain    Chronic pain syndrome    Depression    Fibromyalgia    generalized   Hallucinations    Migraines    Nasal polyps    Nausea    Neck pain    OCD (obsessive compulsive disorder)    OCD (obsessive compulsive disorder)    Ovarian cyst    left   Pneumonia    2021   PONV (postoperative nausea and vomiting)    Schizophrenia ([place])    schizoaffective    PAST SURGICAL HISTORY: Past Surgical History:  Procedure Laterality Date   ABDOMINAL HYSTERECTOMY     LAPAROSCOPIC TUBAL LIGATION Bilateral 06/13/2019   Procedure: LAPAROSCOPIC TUBAL LIGATION;  Surgeon: Chancy Milroy, MD;  Location: [facility];  Service: Gynecology;  Laterality: Bilateral;  FILSHIE CLIPS   NASAL SINUS SURGERY     TUBAL LIGATION     tubes in ears     VAGINAL HYSTERECTOMY Bilateral 01/15/2020   Procedure: HYSTERECTOMY  VAGINAL;  Surgeon: Chancy Milroy, MD;  Location: [facility];  Service: Gynecology;  Laterality: Bilateral;   WISDOM TOOTH EXTRACTION      FAMILY HISTORY: Family History  Problem Relation Age of Onset   Healthy Mother    Healthy Father    Arthritis Maternal Grandmother    Arthritis Maternal Grandfather    Depression Paternal Grandmother    Hyperlipidemia Other    Stroke Other    Hypertension Other    Cancer Other    COPD Other    Asthma Other     SOCIAL HISTORY: Social History   Socioeconomic History   Marital status: Married    Spouse name: Not on file   Number of children: 0   Years of education: 14   Highest education level: Not on file  Occupational History   Occupation: Unemployed  Tobacco Use   Smoking status: Every Day    Packs/day: 0.50    Years: 15.00    Pack years: 7.50    Types: Cigarettes   Smokeless tobacco: Never  Vaping Use   Vaping Use: Every day   Substances: Nicotine  Substance and Sexual Activity   Alcohol use: Never   Drug use: Yes    Comment: prescribed narcotics   Sexual activity: Not on file  Other Topics Concern   Not on file  Social History Narrative   Lives with parents.   Right-handed.   3 glasses of green tea per day.   Social Determinants of Radio broadcast assistant Strain: Not on file  Food Insecurity: No Food Insecurity   Worried About Charity fundraiser in the Last Year: Never true   Ran Out of Food in the Last Year: Never true  Transportation Needs: Not on file  Physical Activity: Not on file  Stress: Not on file  Social Connections: Not on file  Intimate Partner Violence: Not on file     Marcial Pacas, M.D. Ph.D.  [facility] [address] [place], [place] [address] Ph: [phone] Fax: [phone]  CC:  [name], [place],  [place] [id number]  [name], MD

## 2020-11-19 NOTE — Progress Notes (Signed)
Botox- 200 units x 1 vial Lot: [id number] Expiration: 03/2023 NDC: 3546-5681-27  Bacteriostatic 0.9% Sodium Chloride- 11mL total Lot: [id number]  Expiration: 03/2021 NDC: 49449-675-91  Dx: M38.466 S/P

## 2020-11-19 NOTE — Progress Notes (Signed)
Botox injection for chronic migraine prevention, injection was performed according to [place] protocol,  5 units of Botox was injected into each side, for 31 injection sites, total of 155 units  Bilateral frontalis 4 injection sites Bilateral corrugate 2 injection sites Procerus 1 injection sites. Bilateral temporalis 8 injection sites Bilateral occipitalis 6 injection sites Bilateral cervical paraspinals 4 injection sites Bilateral upper trapezius 6 injection sites  Extra 45 unites were injected into bilateral masseters muscle, bilateral temporoparietal region

## 2020-11-20 ENCOUNTER — Ambulatory Visit: Payer: Medicaid Other

## 2020-11-26 ENCOUNTER — Encounter: Payer: Self-pay | Admitting: Physical Therapy

## 2020-11-26 ENCOUNTER — Other Ambulatory Visit: Payer: Self-pay

## 2020-11-26 ENCOUNTER — Ambulatory Visit: Payer: Medicaid Other | Attending: Internal Medicine | Admitting: Physical Therapy

## 2020-11-26 DIAGNOSIS — M6281 Muscle weakness (generalized): Secondary | ICD-10-CM | POA: Insufficient documentation

## 2020-11-26 DIAGNOSIS — M79671 Pain in right foot: Secondary | ICD-10-CM | POA: Diagnosis present

## 2020-11-26 DIAGNOSIS — R208 Other disturbances of skin sensation: Secondary | ICD-10-CM | POA: Insufficient documentation

## 2020-11-26 DIAGNOSIS — M79672 Pain in left foot: Secondary | ICD-10-CM | POA: Insufficient documentation

## 2020-11-26 DIAGNOSIS — R2681 Unsteadiness on feet: Secondary | ICD-10-CM | POA: Diagnosis present

## 2020-11-26 NOTE — Patient Instructions (Signed)
   Carmen Daniels, PT, [place] Certified Exercise Expert for the Aging Adult  11/26/20 9:23 PM Phone: [phone] Fax: [phone]

## 2020-11-26 NOTE — Therapy (Signed)
[place], [place], [address] Phone: [phone]   Fax:  [phone]  Physical Therapy Treatment  Patient Details  Name: Carmen Daniels MRN: [id number] Date of Birth: December 12, 1985 Referring Provider (PT): [name]   Encounter Date: 11/26/2020   PT End of Session - 11/26/20 2112     Visit Number 20    Number of Visits 26    Date for PT Re-Evaluation 12/29/20    Authorization Type [place] [facility]CD    Authorization Time Period Approved for 12 visits from 10/29/2020 - 01/20/2021    PT Start Time 1330    PT Stop Time 1414    PT Time Calculation (min) 44 min    Activity Tolerance Patient tolerated treatment well    Behavior During Therapy [facility] for tasks assessed/performed;Restless             Past Medical History:  Diagnosis Date   Acute respiratory failure with hypoxia (HCC)    Acute rhinosinusitis 07/03/2018   Allergic rhinitis    Anxiety    Asthma    Back pain    Bipolar disorder (HCC)    Borderline personality disorder (HCC)    Bupropion overdose    Chronic pain    Chronic pain syndrome    Depression    Fibromyalgia    generalized   Hallucinations    Migraines    Nasal polyps    Nausea    Neck pain    OCD (obsessive compulsive disorder)    OCD (obsessive compulsive disorder)    Ovarian cyst    left   Pneumonia    2021   PONV (postoperative nausea and vomiting)    Schizophrenia ([place])    schizoaffective    Past Surgical History:  Procedure Laterality Date   ABDOMINAL HYSTERECTOMY     LAPAROSCOPIC TUBAL LIGATION Bilateral 06/13/2019   Procedure: LAPAROSCOPIC TUBAL LIGATION;  Surgeon: Chancy Milroy, MD;  Location: [place];  Service: Gynecology;  Laterality: Bilateral;  FILSHIE CLIPS   NASAL SINUS SURGERY     TUBAL LIGATION     tubes in ears     VAGINAL HYSTERECTOMY Bilateral 01/15/2020   Procedure: HYSTERECTOMY VAGINAL;  Surgeon: Chancy Milroy, MD;  Location: [place];   Service: Gynecology;  Laterality: Bilateral;   WISDOM TOOTH EXTRACTION      There were no vitals filed for this visit.   Subjective Assessment - 11/26/20 1331     Subjective I am doing so much better.   Mother informed that the below ground pool did not work for them , but they are looking at [facility] or [facility] for places to swim.  They will return to land PT next week.  [name] states she still have 6/10 pain in feet    Patient is accompained by: Family member   mother   Diagnostic tests 08/27/2020 NCV study with EMG- Results: This is an abnormal study, there is electrodiagnostic evidence of myopathic changes involving bilateral lower extremity below knee, most consistent with her history of severe rhabdomyolysis.  The abnormalities are limited to below knee muscles, she also have evidence of mild atrophy, significant myalgia at lower extremity compartment.  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    Patient Stated Goals Pt would like to be able to walk again without pain. She would also like to  get back to swimming, driving, and bike riding, as well as playing with her dogs.    Currently in Pain? Yes    Pain Score 6     Pain Location Foot    Pain Orientation Right;Left    Pain Descriptors / Indicators Throbbing;Burning    Pain Type Chronic pain    Pain Onset More than a month ago    Pain Frequency Constant                Pt seen for aquatic therapy today.  Treatment took place in water 3.25-4.8 ft in depth at the [employer] [facility] pool. Temp of water was 94.  Pt entered water via lift but exited by utilizing steps and supervision.  Pt given laminated HEP and went over with instruction and return demo   Ambulation with water walker 4 widths 4.8 ft forward and backward. Pt then given yellow Aquatic DB to utilize to ambulate 4  widths 4.8 ft forward and backward   Prone suspension flutter kicking  Suspended Forward Bicycle Kick with Hand Floats Standing Hip Flexion Extension with Hand Floats  Prone to Supine Transistion with Foam Dumbells and Ankle Floats Abdominal Curls Center with Upper Extremity Flotation Abdominal Curls Diagonal with Upper Extremity Flotation   Standing -stair climbing on 5 steps  ascend/descend pt submerged at the deepest to waist rising until stepping out of thee water.  VC and TC for alternating pattern, weight shift, posture and confidence.  Pt ascends and descends x  5 alternating pattern  Total ascend/descend 15 steps. Recovery between each 5 steps.   Gait and balance challenges: walking with Aquatic DB 4 widths in 4.8 ft.  Progressing to 3 ft depth   VC for le advancement and erect posture, using hips to pull le through swing then heel strike. Pt continues with just minimal difficulty with inversion of left foot. No c/o discomfort weight bearing on feet   Attempted heel cord and gastroc stretch on edge of steps bil  but not tolerated. Pt then utiized gastroc stretch on level ground in pool with better tolerance.  Multiple recovery periods given.     Pt requires buoyancy for support and to offload joints with strengthening exercises. Viscosity of the water is needed for resistance of strengthening; water current perturbations provides challenge to standing balance unsupported, requiring increased core activation.     Aquatic Exercises laminated and reviewed as above Access Code: [email or web address]: [email or web address]: 10/05/2022Prepared by: Donnetta Simpers BeardsleyExercises   Suspended [place] with Hand Floats - 1 x daily - 7 x weekly - 3 sets - 10 reps  Standing Hip Flexion Extension with Hand Floats - 1 x daily - 7 x weekly - 3 sets - 10 reps  Prone to Supine Transistion with Foam Dumbells and Ankle Floats - 1 x daily - 7 x weekly - 3 sets - 10 reps  Abdominal  Curls Center with Upper Extremity Flotation - 1 x daily - 7 x weekly - 3 sets - 10 reps  Abdominal Curls Diagonal with Upper Extremity Flotation - 1 x daily - 7 x weekly - 3 sets - 10 reps                   PT Education - 11/26/20 2112     Education Details Pt /mother given laminated HEP and reviewed during session today.    Person(s) Educated Patient;Parent(s)    Methods Explanation;Demonstration;Tactile cues;Verbal cues;Handout    Comprehension Verbalized understanding;Returned demonstration  PT Short Term Goals - 09/03/20 1150       PT SHORT TERM GOAL #1   Title Pt will demonstrate understanding and report adherence to an HEP to independently address her primary impairments in an effort to promote functional ability.    Baseline HEP given at eval    Time 3    Period Weeks    Status Achieved    Target Date 09/04/20               PT Long Term Goals - 11/13/20 1311       PT LONG TERM GOAL #1   Title Pt will demonstrate global BIL ankle MMT of 4/5 or higher in order to promote walking ability.    Baseline Pt BIL gros ankle MMT 4-/5 to 4+/5    Time 6    Period Weeks    Status Achieved      PT LONG TERM GOAL #2   Title Pt will demonstrate ability to stand >5 minutes in order to progress to showering and toileting independently.    Baseline Pt reports ability to stand >10 minutes with UE support    Time 6    Period Weeks    Status Achieved      PT LONG TERM GOAL #3   Title Pt will demonstrate ability to walk at self-selected pace for >5 minutes in order to progress to walking her dogs without limitation.    Baseline Pt walked 6 minutes today (435 ft) without a rest break (11/13/2020)    Time 6    Period Weeks    Status Achieved      PT LONG TERM GOAL #4   Title Pt will demonstrate intact hot/cold and course/soft discrimination in BIL feet in order to protect her feet from injury when walking.    Baseline Intact course/soft  discrimination in BIL feet, pt able to discriminate cold, but less successful with hot discrimination BIL    Time 6    Period Weeks    Status Achieved      PT LONG TERM GOAL #5   Title Pt will report ability to sit EOB >30 minutes with <5/10 pain in order to sit to watch a movie with minimal limitation.    Baseline Pt experiences slight increase in baseline pain after 20 minutes of sitting    Time 6    Period Weeks    Status On-going      Additional Long Term Goals   Additional Long Term Goals Yes      PT LONG TERM GOAL #6   Title 11/13/2020: Pt will demonstrate ability to walk 6 minutes at a self-selected pace without an AD in order to grocery shop with less limitation.    Baseline Pt has not attempted to walk without AD    Time 6    Period Weeks    Status New    Target Date 12/25/20      PT LONG TERM GOAL #7   Title Pt will demonstrate ability to perform 5 double leg heel raises in order to reach into overhead cabinets without limitation.    Baseline Unable to perform heel raise    Time 6    Period Weeks    Status New    Target Date 12/25/20                   Plan - 11/26/20 2114     Clinical Impression Statement Ms Oh arrives in pool area  in W/C and participate fully in RX session aquatics with multiple rest breaks but is able to ambulate with use of only bil aquatic DB.  Pt/ mother are looking for community swimming convenient to their home such as [facility] or [facility]  Ms Jorden was able to ambulate up and down steps in water starting at waist deep and eventually able to step up and transfer herself to W/C.  Pt continue to have painful feet but was able to tolerate stepping.  Pt/mother given laminated HEP to take to area aquatics.  .Pt requires buoyancy for support and to offload joints with strengthening exercises. Viscosity of the water is needed for resistance of strengthening; water current perturbations provides challenge to standing balance unsupported, requiring  increased core activation.    Personal Factors and Comorbidities Comorbidity 3+    Comorbidities See medical history above    Examination-Activity Limitations Bathing;Locomotion Level;Transfers;Bed Mobility;Bend;Sit;Carry;Squat;Dressing;Stairs;Stand;Hygiene/Grooming;Toileting;Lift    Examination-Participation Restrictions Cleaning;Community Activity;Driving;Laundry    PT Frequency 1x / week    PT Duration 12 weeks    PT Treatment/Interventions Aquatic Therapy;ADLs/Self Care Home Management;Biofeedback;Cryotherapy;Electrical Stimulation;DME Instruction;Ultrasound;Moist Heat;Gait training;Stair training;Functional mobility training;Therapeutic activities;Therapeutic exercise;Balance training;Neuromuscular re-education;Manual techniques;Patient/family education;Taping;Passive range of motion;Dry needling;Spinal Manipulations    PT Next Visit Plan stair climbing, floor recovery, gross LE strengthening, gait training using walker, balance exercises    PT Home Exercise Plan [email or web address]    Consulted and Agree with Plan of Care Patient             Patient will benefit from skilled therapeutic intervention in order to improve the following deficits and impairments:  Abnormal gait, Decreased activity tolerance, Decreased balance, Decreased mobility, Decreased strength, Impaired sensation, Postural dysfunction, Improper body mechanics, Pain, Impaired UE functional use, Difficulty walking, Decreased endurance  Visit Diagnosis: Other disturbances of skin sensation  Pain in right foot  Muscle weakness (generalized)  Unsteadiness on feet  Pain in left foot     Problem List Patient Active Problem List   Diagnosis Date Noted   Weakness 08/19/2020   Gait abnormality 08/19/2020   Neuropathic pain 08/19/2020   Malnutrition of moderate degree 07/01/2020   Acute renal failure ([place]) 06/30/2020   Non-traumatic rhabdomyolysis    Elevated LFTs    Tardive dyskinesia 11/14/2019   Bipolar I disorder,  most recent episode depressed ([place]) 09/13/2019   Social anxiety disorder 09/13/2019   Adjustment disorder with mixed anxiety and depressed mood 07/31/2019   Suicidal ideation    Post-operative state 07/19/2019   Seizures ([place]) 06/08/2019   Bipolar 1 disorder, depressed ([place]) 05/17/2019   Status epilepticus ([place]) 05/17/2019   Intentional benzodiazepine overdose ([place]) 03/29/2019   Intractable chronic 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 status migrainosus 02/08/2019   History of elective abortion 12/28/2018   QT prolongation 07/03/2018   AMS (altered mental status) 07/03/2018   Serum total bilirubin elevated 07/03/2018   Syncope 07/02/2018   Borderline personality disorder ([place]) 04/11/2017   Bipolar I disorder, current or most recent episode manic, with psychotic features ([place]) 04/10/2017   Bipolar disorder ([place]) 04/10/2017   Abnormal MRI of head 04/21/2016   Chronic migraine without aura 03/27/2015   Mild persistent asthma 12/30/2014   Allergic rhinitis due to pollen 12/30/2014   Rhinitis medicamentosa 12/30/2014   Nasal polyposis 12/30/2014   Voncille Lo, PT, [place] Certified Exercise Expert for the Aging Adult  11/26/20 10:17 PM Phone: [phone] Fax: [facility] [address] [place], [place], [address] Phone: [phone]   Fax:  [phone]  Name: VIRGA HALTIWANGER MRN: [id number] Date of Birth: 1985-03-31

## 2020-12-05 ENCOUNTER — Other Ambulatory Visit: Payer: Self-pay | Admitting: Neurology

## 2020-12-05 ENCOUNTER — Other Ambulatory Visit: Payer: Self-pay

## 2020-12-05 ENCOUNTER — Ambulatory Visit: Payer: Medicaid Other

## 2020-12-05 DIAGNOSIS — R2681 Unsteadiness on feet: Secondary | ICD-10-CM

## 2020-12-05 DIAGNOSIS — M79672 Pain in left foot: Secondary | ICD-10-CM

## 2020-12-05 DIAGNOSIS — M79671 Pain in right foot: Secondary | ICD-10-CM

## 2020-12-05 DIAGNOSIS — R208 Other disturbances of skin sensation: Secondary | ICD-10-CM

## 2020-12-05 DIAGNOSIS — M6281 Muscle weakness (generalized): Secondary | ICD-10-CM

## 2020-12-05 NOTE — Therapy (Signed)
[place], [place], [address] Phone: [phone]   Fax:  [phone]  Physical Therapy Treatment  Patient Details  Name: Carmen Daniels MRN: [id number] Date of Birth: 09/24/1985 Referring Provider (PT): [name]   Encounter Date: 12/05/2020   PT End of Session - 12/05/20 1056     Visit Number 21    Number of Visits 26    Date for PT Re-Evaluation 12/29/20    Authorization Type [place] MCD    Authorization Time Period Approved for 12 visits from 10/29/2020 - 01/20/2021    Authorization - Visit Number 5    PT Start Time 0915    PT Stop Time 1000    PT Time Calculation (min) 45 min    Equipment Utilized During Treatment Gait belt;Other (comment)   // bars   Activity Tolerance Patient tolerated treatment well    Behavior During Therapy [facility] for tasks assessed/performed;Restless             Past Medical History:  Diagnosis Date   Acute respiratory failure with hypoxia (HCC)    Acute rhinosinusitis 07/03/2018   Allergic rhinitis    Anxiety    Asthma    Back pain    Bipolar disorder (HCC)    Borderline personality disorder (HCC)    Bupropion overdose    Chronic pain    Chronic pain syndrome    Depression    Fibromyalgia    generalized   Hallucinations    Migraines    Nasal polyps    Nausea    Neck pain    OCD (obsessive compulsive disorder)    OCD (obsessive compulsive disorder)    Ovarian cyst    left   Pneumonia    2021   PONV (postoperative nausea and vomiting)    Schizophrenia ([place])    schizoaffective    Past Surgical History:  Procedure Laterality Date   ABDOMINAL HYSTERECTOMY     LAPAROSCOPIC TUBAL LIGATION Bilateral 06/13/2019   Procedure: LAPAROSCOPIC TUBAL LIGATION;  Surgeon: Chancy Milroy, MD;  Location: [place];  Service: Gynecology;  Laterality: Bilateral;  FILSHIE CLIPS   NASAL SINUS SURGERY     TUBAL LIGATION     tubes in ears     VAGINAL  HYSTERECTOMY Bilateral 01/15/2020   Procedure: HYSTERECTOMY VAGINAL;  Surgeon: Chancy Milroy, MD;  Location: [place];  Service: Gynecology;  Laterality: Bilateral;   WISDOM TOOTH EXTRACTION      There were no vitals filed for this visit.   Subjective Assessment - 12/05/20 0920     Subjective Pt reports moderate BIL ankle pain today, but also states that she was able to walk in her homemade parallel bars without UE support for a few steps this past week. She has continued to do her HEP.    Patient is accompained by: Family member   Mother   Currently in Pain? Yes    Pain Score 5     Pain Location Foot    Pain Orientation Right;Left    Pain Descriptors / Indicators Burning;Aching    Pain Type Chronic pain    Pain Onset More than a month ago    Pain Frequency Constant                               [facility] Adult PT Treatment/Exercise - 12/05/20 0001       Transfers  Transfers Stand to Sit;Sit to Stand    Sit to Stand 6: Modified independent (Device/Increase time)   x5   Stand to Sit 6: Modified independent (Device/Increase time)   x5     Self-Care   Self-Care Other Self-Care Comments   Detailed instruction on how to set up/ use personal TENS unit with electrodes and leads, which were not brought at last visit     Knee/Hip Exercises: Plyometrics   Other Plyometric Exercises Standing in // bars without UE support while boxing moving pillow and forward/back rocking barefoot 2x2 minutes southpaw and traditional stance      Knee/Hip Exercises: Standing   Other Standing Knee Exercises Side stepping in // bars without UE support x5 laps                     PT Education - 12/05/20 1055     Education Details Per pt request, educated on independent use of pt's personal TENS unit with electrodes and leads which were not brought at last instruction.    Person(s) Educated Patient;Parent(s)    Methods Explanation;Demonstration    Comprehension Verbalized  understanding;Returned demonstration              PT Short Term Goals - 09/03/20 1150       PT SHORT TERM GOAL #1   Title Pt will demonstrate understanding and report adherence to an HEP to independently address her primary impairments in an effort to promote functional ability.    Baseline HEP given at eval    Time 3    Period Weeks    Status Achieved    Target Date 09/04/20               PT Long Term Goals - 11/13/20 1311       PT LONG TERM GOAL #1   Title Pt will demonstrate global BIL ankle MMT of 4/5 or higher in order to promote walking ability.    Baseline Pt BIL gros ankle MMT 4-/5 to 4+/5    Time 6    Period Weeks    Status Achieved      PT LONG TERM GOAL #2   Title Pt will demonstrate ability to stand >5 minutes in order to progress to showering and toileting independently.    Baseline Pt reports ability to stand >10 minutes with UE support    Time 6    Period Weeks    Status Achieved      PT LONG TERM GOAL #3   Title Pt will demonstrate ability to walk at self-selected pace for >5 minutes in order to progress to walking her dogs without limitation.    Baseline Pt walked 6 minutes today (435 ft) without a rest break (11/13/2020)    Time 6    Period Weeks    Status Achieved      PT LONG TERM GOAL #4   Title Pt will demonstrate intact hot/cold and course/soft discrimination in BIL feet in order to protect her feet from injury when walking.    Baseline Intact course/soft discrimination in BIL feet, pt able to discriminate cold, but less successful with hot discrimination BIL    Time 6    Period Weeks    Status Achieved      PT LONG TERM GOAL #5   Title Pt will report ability to sit EOB >30 minutes with <5/10 pain in order to sit to watch a movie with minimal limitation.    Baseline Pt  experiences slight increase in baseline pain after 20 minutes of sitting    Time 6    Period Weeks    Status On-going      Additional Long Term Goals   Additional  Long Term Goals Yes      PT LONG TERM GOAL #6   Title 11/13/2020: Pt will demonstrate ability to walk 6 minutes at a self-selected pace without an AD in order to grocery shop with less limitation.    Baseline Pt has not attempted to walk without AD    Time 6    Period Weeks    Status New    Target Date 12/25/20      PT LONG TERM GOAL #7   Title Pt will demonstrate ability to perform 5 double leg heel raises in order to reach into overhead cabinets without limitation.    Baseline Unable to perform heel raise    Time 6    Period Weeks    Status New    Target Date 12/25/20                   Plan - 12/05/20 1057     Clinical Impression Statement Pt responded well to all interventions today, with proper form and no increase in pain with selected interventions, although she does report moderate fatigue following aerobic exercise today. She demonstrates improved standing balance and endurance today, indicated by the pt's ability to perform standing boxing in a staggerred stance without UE support for several minutes. She did well correcting for LOB when reaching outside her BOS while utilizing her hip and ankle musculature. She will continue to benefit from skilled PT to address her primary impairments and return to her prior level of function with less limitation.    Personal Factors and Comorbidities Comorbidity 3+    Comorbidities See medical history above    Examination-Activity Limitations Bathing;Locomotion Level;Transfers;Bed Mobility;Bend;Sit;Carry;Squat;Dressing;Stairs;Stand;Hygiene/Grooming;Toileting;Lift    Examination-Participation Restrictions Cleaning;Community Activity;Driving;Laundry    Stability/Clinical Decision Making Evolving/Moderate complexity    Clinical Decision Making Moderate    Rehab Potential Good    PT Frequency 1x / week    PT Duration 12 weeks    PT Treatment/Interventions Aquatic Therapy;ADLs/Self Care Home Management;Biofeedback;Cryotherapy;Electrical  Stimulation;DME Instruction;Ultrasound;Moist Heat;Gait training;Stair training;Functional mobility training;Therapeutic activities;Therapeutic exercise;Balance training;Neuromuscular re-education;Manual techniques;Patient/family education;Taping;Passive range of motion;Dry needling;Spinal Manipulations    PT Next Visit Plan stair climbing, floor recovery, gross LE strengthening, gait training using walker, balance exercises    PT Home Exercise Plan [email or web address]    Consulted and Agree with Plan of Care Patient             Patient will benefit from skilled therapeutic intervention in order to improve the following deficits and impairments:  Abnormal gait, Decreased activity tolerance, Decreased balance, Decreased mobility, Decreased strength, Impaired sensation, Postural dysfunction, Improper body mechanics, Pain, Impaired UE functional use, Difficulty walking, Decreased endurance  Visit Diagnosis: Other disturbances of skin sensation  Pain in right foot  Muscle weakness (generalized)  Unsteadiness on feet  Pain in left foot     Problem List Patient Active Problem List   Diagnosis Date Noted   Weakness 08/19/2020   Gait abnormality 08/19/2020   Neuropathic pain 08/19/2020   Malnutrition of moderate degree 07/01/2020   Acute renal failure ([place]) 06/30/2020   Non-traumatic rhabdomyolysis    Elevated LFTs    Tardive dyskinesia 11/14/2019   Bipolar I disorder, most recent episode depressed ([place]) 09/13/2019   Social anxiety disorder 09/13/2019   Adjustment disorder with  mixed anxiety and depressed mood 07/31/2019   Suicidal ideation    Post-operative state 07/19/2019   Seizures ([place]) 06/08/2019   Bipolar 1 disorder, depressed ([place]) 05/17/2019   Status epilepticus ([place]) 05/17/2019   Intentional benzodiazepine overdose ([place]) 03/29/2019   Intractable chronic 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 status migrainosus 02/08/2019   History of elective abortion 12/28/2018   QT prolongation  07/03/2018   AMS (altered mental status) 07/03/2018   Serum total bilirubin elevated 07/03/2018   Syncope 07/02/2018   Borderline personality disorder ([place]) 04/11/2017   Bipolar I disorder, current or most recent episode manic, with psychotic features ([place]) 04/10/2017   Bipolar disorder ([place]) 04/10/2017   Abnormal MRI of head 04/21/2016   Chronic migraine without aura 03/27/2015   Mild persistent asthma 12/30/2014   Allergic rhinitis due to pollen 12/30/2014   Rhinitis medicamentosa 12/30/2014   Nasal polyposis 12/30/2014    Vanessa [place], PT, DPT 12/05/20 11:01 AM   [place] [facility] [address] [place], [place], [address] Phone: [phone]   Fax:  [phone]  Name: Carmen Daniels MRN: [id number] Date of Birth: 06-07-1985

## 2020-12-11 ENCOUNTER — Other Ambulatory Visit: Payer: Self-pay | Admitting: Neurology

## 2020-12-12 ENCOUNTER — Ambulatory Visit: Payer: Medicaid Other

## 2020-12-12 ENCOUNTER — Other Ambulatory Visit: Payer: Self-pay

## 2020-12-12 DIAGNOSIS — M6281 Muscle weakness (generalized): Secondary | ICD-10-CM

## 2020-12-12 DIAGNOSIS — R208 Other disturbances of skin sensation: Secondary | ICD-10-CM

## 2020-12-12 DIAGNOSIS — M79672 Pain in left foot: Secondary | ICD-10-CM

## 2020-12-12 DIAGNOSIS — M79671 Pain in right foot: Secondary | ICD-10-CM

## 2020-12-12 DIAGNOSIS — R2681 Unsteadiness on feet: Secondary | ICD-10-CM

## 2020-12-12 NOTE — Therapy (Addendum)
[place], [place], [address] Phone: [phone]   Fax:  [phone]  Physical Therapy Treatment/Discharge  Patient Details  Name: Carmen Daniels MRN: [id number] Date of Birth: [birth year] Referring Provider (PT): [name]   Encounter Date: 12/12/2020   PT End of Session - 12/12/20 1212     Visit Number 22    Number of Visits 26    Date for PT Re-Evaluation 12/29/20    Authorization Type [place] [employer]    Authorization Time Period Approved for 12 visits from 10/29/2020 - 01/20/2021    Authorization - Visit Number 6    Authorization - Number of Visits 12    PT Start Time 1130    PT Stop Time 1215    PT Time Calculation (min) 45 min    Equipment Utilized During Treatment Gait belt    Activity Tolerance Patient tolerated treatment well    Behavior During Therapy [facility] for tasks assessed/performed             Past Medical History:  Diagnosis Date   Acute respiratory failure with hypoxia (HCC)    Acute rhinosinusitis 07/03/2018   Allergic rhinitis    Anxiety    Asthma    Back pain    Bipolar disorder ([place])    Borderline personality disorder (HCC)    Bupropion overdose    Chronic pain    Chronic pain syndrome    Depression    Fibromyalgia    generalized   Hallucinations    Migraines    Nasal polyps    Nausea    Neck pain    OCD (obsessive compulsive disorder)    OCD (obsessive compulsive disorder)    Ovarian cyst    left   Pneumonia    2021   PONV (postoperative nausea and vomiting)    Schizophrenia ([place])    schizoaffective    Past Surgical History:  Procedure Laterality Date   ABDOMINAL HYSTERECTOMY     LAPAROSCOPIC TUBAL LIGATION Bilateral 06/13/2019   Procedure: LAPAROSCOPIC TUBAL LIGATION;  Surgeon: Chancy Milroy, MD;  Location: [place];  Service: Gynecology;  Laterality: Bilateral;  FILSHIE CLIPS   NASAL SINUS SURGERY     TUBAL LIGATION     tubes in ears      VAGINAL HYSTERECTOMY Bilateral 01/15/2020   Procedure: HYSTERECTOMY VAGINAL;  Surgeon: Chancy Milroy, MD;  Location: [place];  Service: Gynecology;  Laterality: Bilateral;   WISDOM TOOTH EXTRACTION      There were no vitals filed for this visit.   Subjective Assessment - 12/12/20 1132     Subjective Pt reports moderate BIL ankle/ feet pain today. She reports doing her HEP more regularly recently, walking for about 160 laps in her home // bar setup every day. She has also been practicing walking without UE support.    Patient is accompained by: Family member    Currently in Pain? Yes    Pain Score 5     Pain Location Foot    Pain Orientation Right;Left    Pain Descriptors / Indicators Aching;Burning    Pain Type Chronic pain                               [facility] Adult PT Treatment/Exercise - 12/12/20 0001       Transfers   Transfers Stand to Sit;Sit to Stand    Sit to Stand 6:  Modified independent (Device/Increase time)    Stand to Sit 6: Modified independent (Device/Increase time)      Ambulation/Gait   Ambulation/Gait Yes    Ambulation/Gait Assistance 6: Modified independent (Device/Increase time)    Ambulation Distance (Feet) 200 Feet    Assistive device Parallel bars    Gait Pattern Step-through pattern      Knee/Hip Exercises: Plyometrics   Other Plyometric Exercises Standing in // bars without UE support while boxing moving pillow and forward/back rocking barefoot 2x2 minutes southpaw and traditional stance      Knee/Hip Exercises: Standing   Other Standing Knee Exercises Marching with slow leg lowering on Airex pad with medial/lateral ankle braces on in // bars 3x10 BIL      Knee/Hip Exercises: Seated   Long Probation officer    Long Arc Quad Weight 2 lbs.    Long Arc Quad Limitations 2x10 BIL                       PT Short Term Goals - 09/03/20 1150       PT SHORT TERM GOAL #1   Title Pt will demonstrate understanding and  report adherence to an HEP to independently address her primary impairments in an effort to promote functional ability.    Baseline HEP given at eval    Time 3    Period Weeks    Status Achieved    Target Date 09/04/20               PT Long Term Goals - 11/13/20 1311       PT LONG TERM GOAL #1   Title Pt will demonstrate global BIL ankle MMT of 4/5 or higher in order to promote walking ability.    Baseline Pt BIL gros ankle MMT 4-/5 to 4+/5    Time 6    Period Weeks    Status Achieved      PT LONG TERM GOAL #2   Title Pt will demonstrate ability to stand >5 minutes in order to progress to showering and toileting independently.    Baseline Pt reports ability to stand >10 minutes with UE support    Time 6    Period Weeks    Status Achieved      PT LONG TERM GOAL #3   Title Pt will demonstrate ability to walk at self-selected pace for >5 minutes in order to progress to walking her dogs without limitation.    Baseline Pt walked 6 minutes today (435 ft) without a rest break (11/13/2020)    Time 6    Period Weeks    Status Achieved      PT LONG TERM GOAL #4   Title Pt will demonstrate intact hot/cold and course/soft discrimination in BIL feet in order to protect her feet from injury when walking.    Baseline Intact course/soft discrimination in BIL feet, pt able to discriminate cold, but less successful with hot discrimination BIL    Time 6    Period Weeks    Status Achieved      PT LONG TERM GOAL #5   Title Pt will report ability to sit EOB >30 minutes with <5/10 pain in order to sit to watch a movie with minimal limitation.    Baseline Pt experiences slight increase in baseline pain after 20 minutes of sitting    Time 6    Period Weeks    Status On-going      Additional Long  Term Goals   Additional Long Term Goals Yes      PT LONG TERM GOAL #6   Title 11/13/2020: Pt will demonstrate ability to walk 6 minutes at a self-selected pace without an AD in order to grocery  shop with less limitation.    Baseline Pt has not attempted to walk without AD    Time 6    Period Weeks    Status New    Target Date 12/25/20      PT LONG TERM GOAL #7   Title Pt will demonstrate ability to perform 5 double leg heel raises in order to reach into overhead cabinets without limitation.    Baseline Unable to perform heel raise    Time 6    Period Weeks    Status New    Target Date 12/25/20                   Plan - 12/12/20 1213     Clinical Impression Statement Pt responded well to all interventions today, with proper form and no increase in pain with selected interventions, although she does report moderate fatigue following aerobic exercises. She demonstrates improved standing balance and endurance today, indicated by the pt's ability to perform standing boxing in a staggerred stance without UE support for several minutes with added mini squat.  She will continue to benefit from skilled PT to address her primary impairments and return to her prior level of function with less limitation.    Personal Factors and Comorbidities Comorbidity 3+    Comorbidities See medical history above    Examination-Activity Limitations Bathing;Locomotion Level;Transfers;Bed Mobility;Bend;Sit;Carry;Squat;Dressing;Stairs;Stand;Hygiene/Grooming;Toileting;Lift    Examination-Participation Restrictions Cleaning;Community Activity;Driving;Laundry    Stability/Clinical Decision Making Evolving/Moderate complexity    Clinical Decision Making Moderate    Rehab Potential Good    PT Frequency 1x / week    PT Duration 12 weeks    PT Treatment/Interventions Aquatic Therapy;ADLs/Self Care Home Management;Biofeedback;Cryotherapy;Electrical Stimulation;DME Instruction;Ultrasound;Moist Heat;Gait training;Stair training;Functional mobility training;Therapeutic activities;Therapeutic exercise;Balance training;Neuromuscular re-education;Manual techniques;Patient/family education;Taping;Passive range of  motion;Dry needling;Spinal Manipulations    PT Next Visit Plan stair climbing, floor recovery, gross LE strengthening, gait training using walker, balance exercises    PT Home Exercise Plan [id number]    Consulted and Agree with Plan of Care Patient             Patient will benefit from skilled therapeutic intervention in order to improve the following deficits and impairments:  Abnormal gait, Decreased activity tolerance, Decreased balance, Decreased mobility, Decreased strength, Impaired sensation, Postural dysfunction, Improper body mechanics, Pain, Impaired UE functional use, Difficulty walking, Decreased endurance  Visit Diagnosis: Other disturbances of skin sensation  Pain in right foot  Muscle weakness (generalized)  Unsteadiness on feet  Pain in left foot     Problem List Patient Active Problem List   Diagnosis Date Noted   Weakness 08/19/2020   Gait abnormality 08/19/2020   Neuropathic pain 08/19/2020   Malnutrition of moderate degree 07/01/2020   Acute renal failure ([place]) 06/30/2020   Non-traumatic rhabdomyolysis    Elevated LFTs    Tardive dyskinesia 11/14/2019   Bipolar I disorder, most recent episode depressed ([place]) 09/13/2019   Social anxiety disorder 09/13/2019   Adjustment disorder with mixed anxiety and depressed mood 07/31/2019   Suicidal ideation    Post-operative state 07/19/2019   Seizures ([place]) 06/08/2019   Bipolar 1 disorder, depressed ([place]) 05/17/2019   Status epilepticus ([place]) 05/17/2019   Intentional benzodiazepine overdose ([place]) 03/29/2019   Intractable chronic migraine  without aura and without status migrainosus 02/08/2019   History of elective abortion 12/28/2018   QT prolongation 07/03/2018   AMS (altered mental status) 07/03/2018   Serum total bilirubin elevated 07/03/2018   Syncope 07/02/2018   Borderline personality disorder ([place]) 04/11/2017   Bipolar I disorder, current or most recent episode manic, with psychotic features ([place])  04/10/2017   Bipolar disorder ([place]) 04/10/2017   Abnormal MRI of head 04/21/2016   Chronic migraine without aura 03/27/2015   Mild persistent asthma 12/30/2014   Allergic rhinitis due to pollen 12/30/2014   Rhinitis medicamentosa 12/30/2014   Nasal polyposis 12/30/2014    Vanessa Cheshire, PT, DPT 12/12/20 12:19 PM  PHYSICAL THERAPY DISCHARGE SUMMARY  Visits from Start of Care: 22  Current functional level related to goals / functional outcomes: See goals above.    Remaining deficits: Status unknown.    Education / Equipment: N/a    Patient agrees to discharge. Patient goals were partially met. Patient is being discharged due to not returning since the last visit.  Gwendolyn Grant, PT, DPT, ATC 01/28/21 5:16 PM  [facility] [address] [place], [place], [address] Phone: [phone]   Fax:  [phone]  Name: HETHER ANSELMO MRN: [id number] Date of Birth: 1985-10-26

## 2020-12-17 ENCOUNTER — Ambulatory Visit: Payer: Medicaid Other | Admitting: Physical Therapy

## 2020-12-17 ENCOUNTER — Other Ambulatory Visit: Payer: Self-pay | Admitting: Neurology

## 2020-12-24 ENCOUNTER — Other Ambulatory Visit: Payer: Self-pay | Admitting: Neurology

## 2020-12-27 ENCOUNTER — Other Ambulatory Visit: Payer: Self-pay | Admitting: Neurology

## 2020-12-31 ENCOUNTER — Ambulatory Visit: Payer: Medicaid Other | Admitting: Physical Therapy

## 2021-01-05 ENCOUNTER — Ambulatory Visit: Payer: Medicaid Other

## 2021-01-05 ENCOUNTER — Telehealth: Payer: Self-pay | Admitting: Neurology

## 2021-01-05 MED ORDER — AJOVY 225 MG/1.5ML ~~LOC~~ SOSY: PREFILLED_SYRINGE | SUBCUTANEOUS | 11 refills | Status: DC

## 2021-01-05 NOTE — Telephone Encounter (Signed)
Pt's mother Manuela Schwartz called requesting refill for her daughters Ajovy 225mg . [place], [place]

## 2021-01-05 NOTE — Telephone Encounter (Signed)
Sent to pharmacy 

## 2021-02-07 ENCOUNTER — Inpatient Hospital Stay (HOSPITAL_COMMUNITY): Payer: Medicaid Other

## 2021-02-07 ENCOUNTER — Emergency Department (HOSPITAL_COMMUNITY): Payer: Medicaid Other

## 2021-02-07 ENCOUNTER — Other Ambulatory Visit: Payer: Self-pay

## 2021-02-07 ENCOUNTER — Inpatient Hospital Stay (HOSPITAL_COMMUNITY)
Admission: EM | Admit: 2021-02-07 | Discharge: 2021-02-19 | DRG: 917 | Disposition: A | Discharge status: Other Acute Inpatient Hospital | Payer: Medicaid Other | Attending: Internal Medicine | Admitting: Internal Medicine

## 2021-02-07 ENCOUNTER — Encounter (HOSPITAL_COMMUNITY): Payer: Self-pay

## 2021-02-07 DIAGNOSIS — G894 Chronic pain syndrome: Secondary | ICD-10-CM | POA: Diagnosis present

## 2021-02-07 DIAGNOSIS — T50902A Poisoning by unspecified drugs, medicaments and biological substances, intentional self-harm, initial encounter: Secondary | ICD-10-CM | POA: Diagnosis not present

## 2021-02-07 DIAGNOSIS — R45851 Suicidal ideations: Secondary | ICD-10-CM | POA: Diagnosis present

## 2021-02-07 DIAGNOSIS — G9341 Metabolic encephalopathy: Secondary | ICD-10-CM | POA: Diagnosis not present

## 2021-02-07 DIAGNOSIS — R001 Bradycardia, unspecified: Secondary | ICD-10-CM | POA: Diagnosis present

## 2021-02-07 DIAGNOSIS — G629 Polyneuropathy, unspecified: Secondary | ICD-10-CM | POA: Diagnosis present

## 2021-02-07 DIAGNOSIS — Z888 Allergy status to other drugs, medicaments and biological substances status: Secondary | ICD-10-CM

## 2021-02-07 DIAGNOSIS — T426X2A Poisoning by other antiepileptic and sedative-hypnotic drugs, intentional self-harm, initial encounter: Secondary | ICD-10-CM | POA: Diagnosis present

## 2021-02-07 DIAGNOSIS — G9340 Encephalopathy, unspecified: Secondary | ICD-10-CM | POA: Diagnosis not present

## 2021-02-07 DIAGNOSIS — F17210 Nicotine dependence, cigarettes, uncomplicated: Secondary | ICD-10-CM | POA: Diagnosis present

## 2021-02-07 DIAGNOSIS — Z781 Physical restraint status: Secondary | ICD-10-CM

## 2021-02-07 DIAGNOSIS — F410 Panic disorder [episodic paroxysmal anxiety] without agoraphobia: Secondary | ICD-10-CM | POA: Diagnosis present

## 2021-02-07 DIAGNOSIS — F312 Bipolar disorder, current episode manic severe with psychotic features: Secondary | ICD-10-CM

## 2021-02-07 DIAGNOSIS — Z20822 Contact with and (suspected) exposure to covid-19: Secondary | ICD-10-CM | POA: Diagnosis present

## 2021-02-07 DIAGNOSIS — J9601 Acute respiratory failure with hypoxia: Secondary | ICD-10-CM | POA: Diagnosis not present

## 2021-02-07 DIAGNOSIS — F3130 Bipolar disorder, current episode depressed, mild or moderate severity, unspecified: Secondary | ICD-10-CM | POA: Diagnosis present

## 2021-02-07 DIAGNOSIS — F429 Obsessive-compulsive disorder, unspecified: Secondary | ICD-10-CM | POA: Diagnosis present

## 2021-02-07 DIAGNOSIS — T424X2A Poisoning by benzodiazepines, intentional self-harm, initial encounter: Principal | ICD-10-CM | POA: Diagnosis present

## 2021-02-07 DIAGNOSIS — Z09 Encounter for follow-up examination after completed treatment for conditions other than malignant neoplasm: Secondary | ICD-10-CM

## 2021-02-07 DIAGNOSIS — F603 Borderline personality disorder: Secondary | ICD-10-CM | POA: Diagnosis present

## 2021-02-07 DIAGNOSIS — Y92003 Bedroom of unspecified non-institutional (private) residence as the place of occurrence of the external cause: Secondary | ICD-10-CM

## 2021-02-07 DIAGNOSIS — G928 Other toxic encephalopathy: Secondary | ICD-10-CM | POA: Diagnosis present

## 2021-02-07 DIAGNOSIS — M797 Fibromyalgia: Secondary | ICD-10-CM | POA: Diagnosis present

## 2021-02-07 DIAGNOSIS — J45909 Unspecified asthma, uncomplicated: Secondary | ICD-10-CM | POA: Diagnosis present

## 2021-02-07 DIAGNOSIS — Z452 Encounter for adjustment and management of vascular access device: Secondary | ICD-10-CM

## 2021-02-07 DIAGNOSIS — F1190 Opioid use, unspecified, uncomplicated: Secondary | ICD-10-CM | POA: Diagnosis present

## 2021-02-07 DIAGNOSIS — Z823 Family history of stroke: Secondary | ICD-10-CM

## 2021-02-07 DIAGNOSIS — Z789 Other specified health status: Secondary | ICD-10-CM

## 2021-02-07 DIAGNOSIS — J189 Pneumonia, unspecified organism: Secondary | ICD-10-CM | POA: Diagnosis present

## 2021-02-07 DIAGNOSIS — E876 Hypokalemia: Secondary | ICD-10-CM | POA: Diagnosis present

## 2021-02-07 DIAGNOSIS — F1590 Other stimulant use, unspecified, uncomplicated: Secondary | ICD-10-CM | POA: Diagnosis present

## 2021-02-07 DIAGNOSIS — F4323 Adjustment disorder with mixed anxiety and depressed mood: Secondary | ICD-10-CM | POA: Diagnosis present

## 2021-02-07 DIAGNOSIS — J9602 Acute respiratory failure with hypercapnia: Secondary | ICD-10-CM | POA: Diagnosis present

## 2021-02-07 DIAGNOSIS — I952 Hypotension due to drugs: Secondary | ICD-10-CM | POA: Diagnosis present

## 2021-02-07 DIAGNOSIS — Z9911 Dependence on respirator [ventilator] status: Secondary | ICD-10-CM

## 2021-02-07 DIAGNOSIS — D649 Anemia, unspecified: Secondary | ICD-10-CM | POA: Diagnosis present

## 2021-02-07 DIAGNOSIS — G43909 Migraine, unspecified, not intractable, without status migrainosus: Secondary | ICD-10-CM | POA: Diagnosis present

## 2021-02-07 DIAGNOSIS — F259 Schizoaffective disorder, unspecified: Secondary | ICD-10-CM | POA: Diagnosis present

## 2021-02-07 DIAGNOSIS — Z79899 Other long term (current) drug therapy: Secondary | ICD-10-CM

## 2021-02-07 DIAGNOSIS — R9431 Abnormal electrocardiogram [ECG] [EKG]: Secondary | ICD-10-CM | POA: Diagnosis present

## 2021-02-07 DIAGNOSIS — Z978 Presence of other specified devices: Secondary | ICD-10-CM

## 2021-02-07 DIAGNOSIS — Z9114 Patient's other noncompliance with medication regimen: Secondary | ICD-10-CM

## 2021-02-07 DIAGNOSIS — Z8249 Family history of ischemic heart disease and other diseases of the circulatory system: Secondary | ICD-10-CM

## 2021-02-07 DIAGNOSIS — T50902D Poisoning by unspecified drugs, medicaments and biological substances, intentional self-harm, subsequent encounter: Secondary | ICD-10-CM | POA: Diagnosis not present

## 2021-02-07 DIAGNOSIS — Z8261 Family history of arthritis: Secondary | ICD-10-CM

## 2021-02-07 DIAGNOSIS — Z825 Family history of asthma and other chronic lower respiratory diseases: Secondary | ICD-10-CM

## 2021-02-07 DIAGNOSIS — Z818 Family history of other mental and behavioral disorders: Secondary | ICD-10-CM

## 2021-02-07 DIAGNOSIS — R4587 Impulsiveness: Secondary | ICD-10-CM | POA: Diagnosis present

## 2021-02-07 LAB — BASIC METABOLIC PANEL
Anion gap: 10 (ref 5–15)
BUN: 7 mg/dL (ref 6–20)
CO2: 18 mmol/L — ABNORMAL LOW (ref 22–32)
Calcium: 8.5 mg/dL — ABNORMAL LOW (ref 8.9–10.3)
Chloride: 111 mmol/L (ref 98–111)
Creatinine, Ser: 0.8 mg/dL (ref 0.44–1.00)
GFR, Estimated: >60 mL/min (ref >60)
Glucose, Bld: 280 mg/dL — ABNORMAL HIGH (ref 70–99)
Potassium: 2.3 mmol/L — CL (ref 3.5–5.1)
Sodium: 139 mmol/L (ref 135–145)

## 2021-02-07 LAB — URINALYSIS, ROUTINE W REFLEX MICROSCOPIC
Bilirubin Urine: NEGATIVE
Glucose, UA: NEGATIVE mg/dL
Hgb urine dipstick: NEGATIVE
Ketones, ur: NEGATIVE mg/dL
Leukocytes,Ua: NEGATIVE
Nitrite: NEGATIVE
Protein, ur: NEGATIVE mg/dL
Specific Gravity, Urine: 1.025 (ref 1.005–1.030)
pH: 6 (ref 5.0–8.0)

## 2021-02-07 LAB — COMPREHENSIVE METABOLIC PANEL
ALT: 19 U/L (ref 0–44)
AST: 25 U/L (ref 15–41)
Albumin: 4.1 g/dL (ref 3.5–5.0)
Alkaline Phosphatase: 58 U/L (ref 38–126)
Anion gap: 10 (ref 5–15)
BUN: <5 mg/dL — ABNORMAL LOW (ref 6–20)
CO2: 25 mmol/L (ref 22–32)
Calcium: 9.8 mg/dL (ref 8.9–10.3)
Chloride: 104 mmol/L (ref 98–111)
Creatinine, Ser: 0.73 mg/dL (ref 0.44–1.00)
GFR, Estimated: >60 mL/min (ref >60)
Glucose, Bld: 116 mg/dL — ABNORMAL HIGH (ref 70–99)
Potassium: 2.9 mmol/L — ABNORMAL LOW (ref 3.5–5.1)
Sodium: 139 mmol/L (ref 135–145)
Total Bilirubin: 1.1 mg/dL (ref 0.3–1.2)
Total Protein: 7 g/dL (ref 6.5–8.1)

## 2021-02-07 LAB — CBC WITH DIFFERENTIAL/PLATELET
Abs Immature Granulocytes: 0.01 10*3/uL (ref 0.00–0.07)
Basophils Absolute: 0 10*3/uL (ref 0.0–0.1)
Basophils Relative: 1 %
Eosinophils Absolute: 0.1 10*3/uL (ref 0.0–0.5)
Eosinophils Relative: 1 %
HCT: 45.7 % (ref 36.0–46.0)
Hemoglobin: 15.3 g/dL — ABNORMAL HIGH (ref 12.0–15.0)
Immature Granulocytes: 0 %
Lymphocytes Relative: 30 %
Lymphs Abs: 1.9 10*3/uL (ref 0.7–4.0)
MCH: 28.8 pg (ref 26.0–34.0)
MCHC: 33.5 g/dL (ref 30.0–36.0)
MCV: 85.9 fL (ref 80.0–100.0)
Monocytes Absolute: 0.5 10*3/uL (ref 0.1–1.0)
Monocytes Relative: 8 %
Neutro Abs: 3.9 10*3/uL (ref 1.7–7.7)
Neutrophils Relative %: 60 %
Platelets: 323 10*3/uL (ref 150–400)
RBC: 5.32 MIL/uL — ABNORMAL HIGH (ref 3.87–5.11)
RDW: 12.7 % (ref 11.5–15.5)
WBC: 6.5 10*3/uL (ref 4.0–10.5)
nRBC: 0 % (ref 0.0–0.2)

## 2021-02-07 LAB — ETHANOL: Alcohol, Ethyl (B): <10 mg/dL (ref <10)

## 2021-02-07 LAB — I-STAT ARTERIAL BLOOD GAS, ED
Acid-base deficit: 1 mmol/L (ref 0.0–2.0)
Acid-base deficit: 2 mmol/L (ref 0.0–2.0)
Bicarbonate: 25.5 mmol/L (ref 20.0–28.0)
Bicarbonate: 21.7 mmol/L (ref 20.0–28.0)
Calcium, Ion: 1.27 mmol/L (ref 1.15–1.40)
Calcium, Ion: 1.1 mmol/L — ABNORMAL LOW (ref 1.15–1.40)
HCT: 38 % (ref 36.0–46.0)
HCT: 31 % — ABNORMAL LOW (ref 36.0–46.0)
Hemoglobin: 12.9 g/dL (ref 12.0–15.0)
Hemoglobin: 10.5 g/dL — ABNORMAL LOW (ref 12.0–15.0)
O2 Saturation: 93 %
O2 Saturation: 100 %
Patient temperature: 98
Patient temperature: 98
Potassium: 2.9 mmol/L — ABNORMAL LOW (ref 3.5–5.1)
Potassium: 3.3 mmol/L — ABNORMAL LOW (ref 3.5–5.1)
Sodium: 141 mmol/L (ref 135–145)
Sodium: 143 mmol/L (ref 135–145)
TCO2: 27 mmol/L (ref 22–32)
TCO2: 23 mmol/L (ref 22–32)
pCO2 arterial: 50.4 mmHg — ABNORMAL HIGH (ref 32.0–48.0)
pCO2 arterial: 31.5 mmHg — ABNORMAL LOW (ref 32.0–48.0)
pH, Arterial: 7.311 — ABNORMAL LOW (ref 7.350–7.450)
pH, Arterial: 7.445 (ref 7.350–7.450)
pO2, Arterial: 71 mmHg — ABNORMAL LOW (ref 83.0–108.0)
pO2, Arterial: 452 mmHg — ABNORMAL HIGH (ref 83.0–108.0)

## 2021-02-07 LAB — ACETAMINOPHEN LEVEL
Acetaminophen (Tylenol), Serum: <10 ug/mL — ABNORMAL LOW (ref 10–30)
Acetaminophen (Tylenol), Serum: <10 ug/mL — ABNORMAL LOW (ref 10–30)

## 2021-02-07 LAB — MRSA NEXT GEN BY PCR, NASAL: MRSA by PCR Next Gen: DETECTED — AB

## 2021-02-07 LAB — RAPID URINE DRUG SCREEN, HOSP PERFORMED
Amphetamines: POSITIVE — AB
Barbiturates: NOT DETECTED
Benzodiazepines: POSITIVE — AB
Cocaine: NOT DETECTED
Opiates: NOT DETECTED
Tetrahydrocannabinol: NOT DETECTED

## 2021-02-07 LAB — CBC
HCT: 32.5 % — ABNORMAL LOW (ref 36.0–46.0)
Hemoglobin: 11.1 g/dL — ABNORMAL LOW (ref 12.0–15.0)
MCH: 30 pg (ref 26.0–34.0)
MCHC: 34.2 g/dL (ref 30.0–36.0)
MCV: 87.8 fL (ref 80.0–100.0)
Platelets: 206 10*3/uL (ref 150–400)
RBC: 3.7 MIL/uL — ABNORMAL LOW (ref 3.87–5.11)
RDW: 12.8 % (ref 11.5–15.5)
WBC: 8.2 10*3/uL (ref 4.0–10.5)
nRBC: 0 % (ref 0.0–0.2)

## 2021-02-07 LAB — CREATININE, SERUM
Creatinine, Ser: 0.62 mg/dL (ref 0.44–1.00)
GFR, Estimated: >60 mL/min (ref >60)

## 2021-02-07 LAB — HEMOGLOBIN A1C
Hgb A1c MFr Bld: 4.7 % — ABNORMAL LOW (ref 4.8–5.6)
Mean Plasma Glucose: 88.19 mg/dL

## 2021-02-07 LAB — MAGNESIUM
Magnesium: 1.6 mg/dL — ABNORMAL LOW (ref 1.7–2.4)
Magnesium: 1.6 mg/dL — ABNORMAL LOW (ref 1.7–2.4)

## 2021-02-07 LAB — GLUCOSE, CAPILLARY
Glucose-Capillary: 144 mg/dL — ABNORMAL HIGH (ref 70–99)
Glucose-Capillary: 174 mg/dL — ABNORMAL HIGH (ref 70–99)
Glucose-Capillary: 239 mg/dL — ABNORMAL HIGH (ref 70–99)

## 2021-02-07 LAB — SALICYLATE LEVEL
Salicylate Lvl: <7 mg/dL — ABNORMAL LOW (ref 7.0–30.0)
Salicylate Lvl: <7 mg/dL — ABNORMAL LOW (ref 7.0–30.0)

## 2021-02-07 LAB — RESP PANEL BY RT-PCR (FLU A&B, COVID) ARPGX2
Influenza A by PCR: NEGATIVE
Influenza B by PCR: NEGATIVE
SARS Coronavirus 2 by RT PCR: NEGATIVE

## 2021-02-07 LAB — PHOSPHORUS
Phosphorus: 1.7 mg/dL — ABNORMAL LOW (ref 2.5–4.6)
Phosphorus: 1.1 mg/dL — ABNORMAL LOW (ref 2.5–4.6)

## 2021-02-07 LAB — CK: Total CK: 177 U/L (ref 38–234)

## 2021-02-07 MED ORDER — FENTANYL CITRATE PF 50 MCG/ML IJ SOSY: 25.0000 ug | PREFILLED_SYRINGE | INTRAMUSCULAR | Status: DC | PRN

## 2021-02-07 MED ORDER — POTASSIUM CHLORIDE 20 MEQ PO PACK
80.0000 meq | PACK | Freq: Once | ORAL | Status: AC
  Administered 2021-02-07: 80 meq
  Filled 2021-02-07: qty 4

## 2021-02-07 MED ORDER — CALCIUM GLUCONATE-NACL 1-0.675 GM/50ML-% IV SOLN
1.0000 g | Freq: Once | INTRAVENOUS | Status: DC
  Filled 2021-02-07 (×2): qty 50

## 2021-02-07 MED ORDER — MUPIROCIN 2 % EX OINT
1.0000 "application " | TOPICAL_OINTMENT | Freq: Two times a day (BID) | CUTANEOUS | Status: AC
  Administered 2021-02-07 – 2021-02-12 (×10): 1 via NASAL
  Filled 2021-02-07 (×5): qty 22

## 2021-02-07 MED ORDER — FENTANYL CITRATE PF 50 MCG/ML IJ SOSY
PREFILLED_SYRINGE | INTRAMUSCULAR | Status: AC
  Filled 2021-02-07: qty 1

## 2021-02-07 MED ORDER — POLYETHYLENE GLYCOL 3350 17 G PO PACK: 17.0000 g | PACK | Freq: Every day | ORAL | Status: DC

## 2021-02-07 MED ORDER — VANCOMYCIN HCL 1500 MG/300ML IV SOLN
1500.0000 mg | Freq: Once | INTRAVENOUS | Status: AC
  Administered 2021-02-07: 1500 mg via INTRAVENOUS
  Filled 2021-02-07: qty 300

## 2021-02-07 MED ORDER — SODIUM CHLORIDE 0.9 % IV BOLUS
1000.0000 mL | Freq: Once | INTRAVENOUS | Status: AC
  Administered 2021-02-07: 1000 mL via INTRAVENOUS

## 2021-02-07 MED ORDER — DOCUSATE SODIUM 100 MG PO CAPS
100.0000 mg | ORAL_CAPSULE | Freq: Two times a day (BID) | ORAL | Status: DC | PRN
  Filled 2021-02-07 (×2): qty 1

## 2021-02-07 MED ORDER — PANTOPRAZOLE SODIUM 40 MG IV SOLR
40.0000 mg | Freq: Every day | INTRAVENOUS | Status: DC
  Administered 2021-02-07: 40 mg via INTRAVENOUS
  Filled 2021-02-07: qty 40

## 2021-02-07 MED ORDER — SODIUM BICARBONATE 8.4 % IV SOLN
100.0000 meq | Freq: Once | INTRAVENOUS | Status: AC
  Administered 2021-02-07: 100 meq via INTRAVENOUS
  Filled 2021-02-07: qty 100

## 2021-02-07 MED ORDER — NOREPINEPHRINE 4 MG/250ML-% IV SOLN
2.0000 ug/min | INTRAVENOUS | Status: DC
  Administered 2021-02-07: 2 ug/min via INTRAVENOUS
  Filled 2021-02-07 (×2): qty 250

## 2021-02-07 MED ORDER — SODIUM CHLORIDE 0.9 % IV SOLN
250.0000 mL | INTRAVENOUS | Status: DC
  Administered 2021-02-07: 250 mL via INTRAVENOUS

## 2021-02-07 MED ORDER — POTASSIUM CHLORIDE 10 MEQ/100ML IV SOLN
10.0000 meq | INTRAVENOUS | Status: AC
  Administered 2021-02-07 (×2): 10 meq via INTRAVENOUS
  Filled 2021-02-07 (×2): qty 100

## 2021-02-07 MED ORDER — LACTATED RINGERS IV BOLUS
1000.0000 mL | Freq: Once | INTRAVENOUS | Status: AC
Start: 2021-02-07 — End: 2021-02-07
  Administered 2021-02-07: 1000 mL via INTRAVENOUS

## 2021-02-07 MED ORDER — NALOXONE HCL 2 MG/2ML IJ SOSY: 2.0000 mg | PREFILLED_SYRINGE | Freq: Once | INTRAMUSCULAR | Status: DC

## 2021-02-07 MED ORDER — CHLORHEXIDINE GLUCONATE CLOTH 2 % EX PADS
6.0000 | MEDICATED_PAD | Freq: Every day | CUTANEOUS | Status: DC
  Administered 2021-02-07 – 2021-02-18 (×12): 6 via TOPICAL

## 2021-02-07 MED ORDER — NALOXONE HCL 0.4 MG/ML IJ SOLN
0.4000 mg | Freq: Once | INTRAMUSCULAR | Status: DC
  Filled 2021-02-07: qty 1

## 2021-02-07 MED ORDER — PROPOFOL 1000 MG/100ML IV EMUL
0.0000 ug/kg/min | INTRAVENOUS | Status: DC
  Administered 2021-02-07: 5 ug/kg/min via INTRAVENOUS
  Administered 2021-02-07: 15 ug/kg/min via INTRAVENOUS
  Administered 2021-02-08: 04:00:00 20 ug/kg/min via INTRAVENOUS
  Filled 2021-02-07 (×4): qty 100

## 2021-02-07 MED ORDER — SODIUM CHLORIDE 0.9 % IV SOLN
1.0000 g | Freq: Three times a day (TID) | INTRAVENOUS | Status: DC
  Administered 2021-02-07 – 2021-02-09 (×6): 1 g via INTRAVENOUS
  Filled 2021-02-07 (×9): qty 1

## 2021-02-07 MED ORDER — ETOMIDATE 2 MG/ML IV SOLN
INTRAVENOUS | Status: AC
  Filled 2021-02-07: qty 20

## 2021-02-07 MED ORDER — HEPARIN SODIUM (PORCINE) 5000 UNIT/ML IJ SOLN
5000.0000 [IU] | Freq: Three times a day (TID) | INTRAMUSCULAR | Status: DC
  Administered 2021-02-07 – 2021-02-19 (×36): 5000 [IU] via SUBCUTANEOUS
  Filled 2021-02-07 (×36): qty 1

## 2021-02-07 MED ORDER — FENTANYL CITRATE (PF) 100 MCG/2ML IJ SOLN
25.0000 ug | INTRAMUSCULAR | Status: DC | PRN
Start: 2021-02-07 — End: 2021-02-08
  Administered 2021-02-07 – 2021-02-08 (×6): 50 ug via INTRAVENOUS
  Filled 2021-02-07 (×8): qty 2

## 2021-02-07 MED ORDER — CALCIUM GLUCONATE-NACL 1-0.675 GM/50ML-% IV SOLN
1.0000 g | Freq: Once | INTRAVENOUS | Status: AC
  Administered 2021-02-07: 1000 mg via INTRAVENOUS
  Filled 2021-02-07 (×2): qty 50

## 2021-02-07 MED ORDER — LACTATED RINGERS IV SOLN: INTRAVENOUS | Status: AC

## 2021-02-07 MED ORDER — SODIUM BICARBONATE 8.4 % IV SOLN
INTRAVENOUS | Status: AC
  Filled 2021-02-07: qty 50

## 2021-02-07 MED ORDER — VITAL HIGH PROTEIN PO LIQD
1000.0000 mL | ORAL | Status: DC
  Administered 2021-02-07: 1000 mL
  Filled 2021-02-07: qty 1000

## 2021-02-07 MED ORDER — MAGNESIUM SULFATE 4 GM/100ML IV SOLN
4.0000 g | Freq: Once | INTRAVENOUS | Status: DC
  Filled 2021-02-07: qty 100

## 2021-02-07 MED ORDER — DOCUSATE SODIUM 50 MG/5ML PO LIQD
100.0000 mg | Freq: Two times a day (BID) | ORAL | Status: DC
  Filled 2021-02-07 (×2): qty 10

## 2021-02-07 MED ORDER — LACTATED RINGERS IV BOLUS
1000.0000 mL | Freq: Once | INTRAVENOUS | Status: AC
  Administered 2021-02-07: 1000 mL via INTRAVENOUS

## 2021-02-07 MED ORDER — AZTREONAM 2 G IJ SOLR
2.0000 g | Freq: Once | INTRAMUSCULAR | Status: AC
  Administered 2021-02-07: 2 g via INTRAVENOUS
  Filled 2021-02-07: qty 2

## 2021-02-07 MED ORDER — SUCCINYLCHOLINE CHLORIDE 20 MG/ML IJ SOLN
INTRAMUSCULAR | Status: AC
  Filled 2021-02-07: qty 1

## 2021-02-07 MED ORDER — SODIUM CHLORIDE 0.9 % IV BOLUS
1000.0000 mL | Freq: Once | INTRAVENOUS | Status: AC
  Administered 2021-02-07: 500 mL via INTRAVENOUS

## 2021-02-07 MED ORDER — NALOXONE HCL 0.4 MG/ML IJ SOLN
0.4000 mg | Freq: Once | INTRAMUSCULAR | Status: AC
  Administered 2021-02-07: 0.4 mg via INTRAVENOUS

## 2021-02-07 MED ORDER — FENTANYL CITRATE (PF) 100 MCG/2ML IJ SOLN
INTRAMUSCULAR | Status: AC | PRN
  Administered 2021-02-07: 50 ug via INTRAVENOUS

## 2021-02-07 MED ORDER — SUCCINYLCHOLINE CHLORIDE 20 MG/ML IJ SOLN
INTRAMUSCULAR | Status: AC | PRN
  Administered 2021-02-07: 60 mg via INTRAVENOUS

## 2021-02-07 MED ORDER — NALOXONE HCL 0.4 MG/ML IJ SOLN
0.4000 mg | Freq: Once | INTRAMUSCULAR | Status: AC
  Administered 2021-02-07: 0.4 mg via INTRAVENOUS
  Filled 2021-02-07: qty 1

## 2021-02-07 MED ORDER — POTASSIUM CHLORIDE 20 MEQ PO PACK
40.0000 meq | PACK | ORAL | Status: AC
  Filled 2021-02-07: qty 2

## 2021-02-07 MED ORDER — EPINEPHRINE HCL 5 MG/250ML IV SOLN IN NS
0.5000 ug/min | INTRAVENOUS | Status: DC
  Administered 2021-02-07: 14 ug/min via INTRAVENOUS
  Administered 2021-02-07: 2 ug/min via INTRAVENOUS
  Administered 2021-02-08: 7 ug/min via INTRAVENOUS
  Filled 2021-02-07 (×3): qty 250

## 2021-02-07 MED ORDER — ROCURONIUM BROMIDE 50 MG/5ML IV SOLN
INTRAVENOUS | Status: AC
  Filled 2021-02-07: qty 2

## 2021-02-07 MED ORDER — POLYETHYLENE GLYCOL 3350 17 G PO PACK: 17.0000 g | PACK | Freq: Every day | ORAL | Status: DC | PRN

## 2021-02-07 MED ORDER — INSULIN ASPART 100 UNIT/ML IJ SOLN
0.0000 [IU] | INTRAMUSCULAR | Status: DC
  Administered 2021-02-07 – 2021-02-08 (×3): 2 [IU] via SUBCUTANEOUS

## 2021-02-07 MED ORDER — VANCOMYCIN HCL 1250 MG/250ML IV SOLN
1250.0000 mg | Freq: Two times a day (BID) | INTRAVENOUS | Status: DC
  Administered 2021-02-07 – 2021-02-09 (×4): 1250 mg via INTRAVENOUS
  Filled 2021-02-07 (×5): qty 250

## 2021-02-07 MED ORDER — MAGNESIUM SULFATE 4 GM/100ML IV SOLN
4.0000 g | Freq: Once | INTRAVENOUS | Status: AC
  Administered 2021-02-07: 4 g via INTRAVENOUS
  Filled 2021-02-07 (×2): qty 100

## 2021-02-07 MED ORDER — CHLORHEXIDINE GLUCONATE 0.12% ORAL RINSE (MEDLINE KIT)
15.0000 mL | Freq: Two times a day (BID) | OROMUCOSAL | Status: DC
  Administered 2021-02-07 – 2021-02-08 (×3): 15 mL via OROMUCOSAL

## 2021-02-07 MED ORDER — ORAL CARE MOUTH RINSE
15.0000 mL | OROMUCOSAL | Status: DC
  Administered 2021-02-07 – 2021-02-08 (×10): 15 mL via OROMUCOSAL

## 2021-02-07 MED ORDER — PROSOURCE TF PO LIQD
45.0000 mL | Freq: Two times a day (BID) | ORAL | Status: DC
  Administered 2021-02-07 – 2021-02-08 (×2): 45 mL
  Filled 2021-02-07 (×2): qty 45

## 2021-02-07 MED ORDER — LIDOCAINE HCL (CARDIAC) PF 100 MG/5ML IV SOSY
PREFILLED_SYRINGE | INTRAVENOUS | Status: AC
  Filled 2021-02-07: qty 5

## 2021-02-07 NOTE — ED Notes (Signed)
°  Ccm called and aware patient heart of 49 and current b/p

## 2021-02-07 NOTE — Progress Notes (Signed)
An USGPIV (ultrasound guided PIV) has been placed for short-term vasopressor infusion. A correctly placed ivWatch must be used when administering Vasopressors. Should this treatment be needed beyond 72 hours, central line access should be obtained.  It will be the responsibility of the bedside nurse to follow best practice to prevent extravasations. RN, Chloe aware of the need to place an iWatch.

## 2021-02-07 NOTE — Progress Notes (Signed)
Pharmacy Antibiotic Note  Carmen Daniels is a 35 y.o. female admitted on 02/07/2021 with possible drug overdose.  Patient was intubated in the ED for airway protection.   Pharmacy has been consulted for vancomycin and Azactam dosing for sepsis.  Patient has anaphylaxis to Keflex and hasn't tried any other PCN or cephalosporins.   SCr 0.73, CrCL > 100 ml/min, afebrile, WBC WNL.  Plan: Vanc 1500mg  IV x 1, then 1250mg  IV Q12H for AUC 462 using SCr 0.8 Azactam 2g IV x1 then 1gm IV Q8H Monitor renal fxn, clinical progress, vanc level as indicated  Height: 5\' 11"  (180.3 cm) Weight: 79.4 kg (175 lb 0.7 oz) IBW/kg (Calculated) : 70.8  Temp (24hrs), Avg:97.8 F (36.6 C), Min:97.5 F (36.4 C), Max:98 F (36.7 C)  Recent Labs  Lab 02/07/21 0200  WBC 6.5  CREATININE 0.73    Estimated Creatinine Clearance: 108.7 mL/min (by C-G formula based on SCr of 0.73 mg/dL).    Allergies  Allergen Reactions   Keflex [Cephalexin] Anaphylaxis    Vanc 12/17 >> Azactam 12/17 >>   12/17 BCx -   Syrenity Klepacki D. Mina Marble, PharmD, BCPS, [place] 02/07/2021, 9:33 AM

## 2021-02-07 NOTE — Progress Notes (Signed)
PCCM Interval Progress Note  CXR reviewed. Left subclavian line malpositioned. Line removed. Replaced with R IJ. CXR pending before use.

## 2021-02-07 NOTE — ED Notes (Signed)
Bear hugger applied. CCM aware of patient current status.

## 2021-02-07 NOTE — ED Provider Notes (Signed)
[facility] EMERGENCY DEPARTMENT Provider Note   CSN: [id number] Arrival date & time: 02/07/21  0145     History Chief Complaint  Patient presents with   Possible Drug Overdose    Carmen Daniels is a 35 y.o. female.  Patient presents to ER chief complaint of suicidal intent and overdose of multiple medications.  Per police report and EMS patient had multiple empty bottles besides her.  She was face timing and texting a friend and stating that she no longer wanted to live and want to end her life.  Her friend called the police who came and brought her to the ER.  Patient appears very somnolent now unable to obtain history or review of systems from the patient herself.  Empty medication bottles besides her included medications of clonazepam sumatriptan's and Ambien.      Past Medical History:  Diagnosis Date   Acute respiratory failure with hypoxia (HCC)    Acute rhinosinusitis 07/03/2018   Allergic rhinitis    Anxiety    Asthma    Back pain    Bipolar disorder (HCC)    Borderline personality disorder (HCC)    Bupropion overdose    Chronic pain    Chronic pain syndrome    Depression    Fibromyalgia    generalized   Hallucinations    Migraines    Nasal polyps    Nausea    Neck pain    OCD (obsessive compulsive disorder)    OCD (obsessive compulsive disorder)    Ovarian cyst    left   Pneumonia    2021   PONV (postoperative nausea and vomiting)    Schizophrenia ([place])    schizoaffective    Patient Active Problem List   Diagnosis Date Noted   Weakness 08/19/2020   Gait abnormality 08/19/2020   Neuropathic pain 08/19/2020   Malnutrition of moderate degree 07/01/2020   Acute renal failure ([place]) 06/30/2020   Non-traumatic rhabdomyolysis    Elevated LFTs    Tardive dyskinesia 11/14/2019   Bipolar I disorder, most recent episode depressed ([place]) 09/13/2019   Social anxiety disorder 09/13/2019   Adjustment disorder with mixed anxiety and depressed  mood 07/31/2019   Suicidal ideation    Post-operative state 07/19/2019   Seizures ([place]) 06/08/2019   Bipolar 1 disorder, depressed ([place]) 05/17/2019   Status epilepticus ([place]) 05/17/2019   Intentional benzodiazepine overdose ([place]) 03/29/2019   Intractable chronic 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 status migrainosus 02/08/2019   History of elective abortion 12/28/2018   QT prolongation 07/03/2018   AMS (altered mental status) 07/03/2018   Serum total bilirubin elevated 07/03/2018   Syncope 07/02/2018   Borderline personality disorder ([place]) 04/11/2017   Bipolar I disorder, current or most recent episode manic, with psychotic features ([place]) 04/10/2017   Bipolar disorder ([place]) 04/10/2017   Abnormal MRI of head 04/21/2016   Chronic migraine without aura 03/27/2015   Mild persistent asthma 12/30/2014   Allergic rhinitis due to pollen 12/30/2014   Rhinitis medicamentosa 12/30/2014   Nasal polyposis 12/30/2014    Past Surgical History:  Procedure Laterality Date   ABDOMINAL HYSTERECTOMY     LAPAROSCOPIC TUBAL LIGATION Bilateral 06/13/2019   Procedure: LAPAROSCOPIC TUBAL LIGATION;  Surgeon: Chancy Milroy, MD;  Location: [facility];  Service: Gynecology;  Laterality: Bilateral;  FILSHIE CLIPS   NASAL SINUS SURGERY     TUBAL LIGATION     tubes in ears     VAGINAL HYSTERECTOMY Bilateral 01/15/2020  Procedure: HYSTERECTOMY VAGINAL;  Surgeon: Chancy Milroy, MD;  Location: [place];  Service: Gynecology;  Laterality: Bilateral;   WISDOM TOOTH EXTRACTION       OB History   No obstetric history on file.     Family History  Problem Relation Age of Onset   Healthy Mother    Healthy Father    Arthritis Maternal Grandmother    Arthritis Maternal Grandfather    Depression Paternal Grandmother    Hyperlipidemia Other    Stroke Other    Hypertension Other    Cancer Other    COPD Other    Asthma Other     Social History   Tobacco Use   Smoking status: Every Day     Packs/day: 0.50    Years: 15.00    Pack years: 7.50    Types: Cigarettes   Smokeless tobacco: Never  Vaping Use   Vaping Use: Every day   Substances: Nicotine  Substance Use Topics   Alcohol use: Never   Drug use: Yes    Comment: prescribed narcotics    Home Medications Prior to Admission medications   Medication Sig Start Date End Date Taking? Authorizing Provider  beclomethasone (QVAR) 40 MCG/ACT inhaler Inhale 1 puff into the lungs 2 (two) times daily as needed (asthma).     [provider]  botulinum toxin Type A (BOTOX) 100 units SOLR injection Inject 200 Units into the muscle every 3 (three) months. Inject into head and neck muscles 04/30/19   Marcial Pacas, MD  buprenorphine (SUBUTEX) 2 MG SUBL SL tablet Place under the tongue daily.    [provider]  clonazePAM (KLONOPIN) 1 MG tablet Take 1 mg by mouth 4 (four) times daily. 07/14/19   [provider]  Fremanezumab-vfrm (AJOVY) 225 MG/1.5ML SOSY INJECT 225 MG INTO THE SKIN EVERY 30 (THIRTY) DAYS. 01/05/21   Marcial Pacas, MD  gabapentin (NEURONTIN) 400 MG capsule Take 400 mg by mouth 3 (three) times daily.    [provider]  Multiple Vitamins-Calcium (ONE-A-DAY WOMENS FORMULA PO) Take 1 tablet by mouth daily.    [provider]  OXcarbazepine (TRILEPTAL) 150 MG tablet Take 2 tablets (300 mg total) by mouth 2 (two) times daily. 08/19/20   Marcial Pacas, MD  SUMAtriptan (IMITREX) 100 MG tablet TAKE 1 TABLET BY MOUTH EVERY 2 HOURS AS NEEDED FOR MIGRAINE. MAY DOSE 2 PER DAY OR 3 TIMES A WEEK. 12 TABLETS PER 30 DAYS, NO EARLY REFILL 12/15/20   Marcial Pacas, MD  traZODone (DESYREL) 50 MG tablet Take 50 mg by mouth at bedtime as needed for sleep. for sleep 04/05/20   [provider]  zolpidem (AMBIEN) 10 MG tablet Take 10 mg by mouth at bedtime. 07/28/20   [provider]    Allergies    Keflex [cephalexin]  Review of Systems   Review of Systems  Unable to perform ROS: Mental status  change   Physical Exam Updated Vital Signs BP (!) 102/48 (BP Location: Right Arm)    Pulse 60    Temp 98 F (36.7 C) (Oral)    Resp 12    Ht 5\' 11"  (1.803 m)    Wt 79.4 kg    LMP  (LMP Unknown)    SpO2 98%    BMI 24.41 kg/m   Physical Exam Constitutional:      Comments: Patient is sleeping.  She will wake up to sternal rub but then goes back to sleep.  Breathing on her own and  purposeful movements are present.  HENT:     Head: Normocephalic.     Nose: Nose normal.  Eyes:     Extraocular Movements: Extraocular movements intact.  Cardiovascular:     Rate and Rhythm: Normal rate.  Pulmonary:     Effort: Pulmonary effort is normal.  Abdominal:     Tenderness: There is no abdominal tenderness. There is no guarding or rebound.  Musculoskeletal:        General: Normal range of motion.     Cervical back: Normal range of motion.  Neurological:     Comments: Moderately obtunded.  Maintaining her airway.  Moving all extremities.    ED Results / Procedures / Treatments   Labs (all labs ordered are listed, but only abnormal results are displayed) Labs Reviewed  CBC WITH DIFFERENTIAL/PLATELET - Abnormal; Notable for the following components:      Result Value   RBC 5.32 (*)    Hemoglobin 15.3 (*)    All other components within normal limits  COMPREHENSIVE METABOLIC PANEL - Abnormal; Notable for the following components:   Potassium 2.9 (*)    Glucose, Bld 116 (*)    BUN <5 (*)    All other components within normal limits  ACETAMINOPHEN LEVEL - Abnormal; Notable for the following components:   Acetaminophen (Tylenol), Serum <10 (*)    All other components within normal limits  SALICYLATE LEVEL - Abnormal; Notable for the following components:   Salicylate Lvl <2.5 (*)    All other components within normal limits  RESP PANEL BY RT-PCR (FLU A&B, COVID) ARPGX2  ETHANOL  RAPID URINE DRUG SCREEN, HOSP PERFORMED  URINALYSIS, ROUTINE W REFLEX MICROSCOPIC    EKG None  Radiology No  results found.  Procedures Procedures   Medications Ordered in ED Medications  potassium chloride 10 mEq in 100 mL IVPB (has no administration in time range)  naloxone [facility]) injection 0.4 mg (0.4 mg Intravenous Given 02/07/21 0208)  sodium chloride 0.9 % bolus 1,000 mL (0 mLs Intravenous Stopped 02/07/21 0340)    ED Course  I have reviewed the triage vital signs and the nursing notes.  Pertinent labs & imaging results that were available during my care of the patient were reviewed by me and considered in my medical decision making (see chart for details).    MDM Rules/Calculators/A&P                         Poison control contacted.  Recommending symptom management and observation for 8 hours.  Initial Tylenol and aspirin levels are negative.  Patient will be medically cleared in the morning for TTS evaluation.      Final Clinical Impression(s) / ED Diagnoses Final diagnoses:  Intentional overdose, initial encounter [facility])    Rx / DC Orders ED Discharge Orders     None        Luna Fuse, MD 02/07/21 [phone]

## 2021-02-07 NOTE — Procedures (Addendum)
Central Venous Catheter Insertion Procedure Note  GIONNI FREESE  [id number]  07-19-1985  Date:02/07/21  Time:6:19 PM   Provider Performing:Klare Criss Rodman Pickle   Procedure: Insertion of Non-tunneled Central Venous Catheter(36556) with US guidance (16606)   Indication(s) Medication administration  Consent Unable to obtain consent due to emergent nature of procedure.  Anesthesia Topical only with 1% lidocaine   Timeout Verified patient identification, verified procedure, site/side was marked, verified correct patient position, special equipment/implants available, medications/allergies/relevant history reviewed, required imaging and test results available.  Sterile Technique Maximal sterile technique including full sterile barrier drape, hand hygiene, sterile gown, sterile gloves, mask, hair covering, sterile ultrasound probe cover (if used).  Procedure Description Area of catheter insertion was cleaned with chlorhexidine and draped in sterile fashion.  With real-time ultrasound guidance a central venous catheter was placed into the right internal jugular vein. Nonpulsatile blood flow and easy flushing noted in all ports.  The catheter was sutured in place and sterile dressing applied.  Complications/Tolerance None; patient tolerated the procedure well. Chest X-ray is ordered to verify placement for internal jugular or subclavian cannulation.  Chest x-ray reviewed. OK to use line. Communicated to RN  EBL Minimal  Specimen(s) None

## 2021-02-07 NOTE — Progress Notes (Addendum)
Called RE pt HR 48 and SBP 80-100   At baseline it looks like pt SBP is 100-115 and Hr looks like she typically is about 70. In ED prior to intubation has been 50-60   She is starting to move around more with some reaching for ETT, is not fully awake or agitation. prop currently at 5   Bradycardia Hypotension -SBPs have decreased following intubation but MAPs are still adequate.  -looks like temp is also low which may be contributing  P -additional 1 LR following by mIVF. She looks very dry and we have room to give more volume if needed -will add soft wrist restraints -low dose prop is available for titration -- this does have potential to impact hemodynamics  -I will add PRN fent which hemodynamically may be more stable that prop titration  -warming blanket   Please continue to page/call for concerns -- low dose vasoactive medications may ultimately be indicated.     Eliseo Gum MSN, AGACNP-BC [place] for pager  02/07/2021, 10:16 AM

## 2021-02-07 NOTE — ED Notes (Signed)
Returned from CT patient still will only respond to sternal rub by pushing you away.

## 2021-02-07 NOTE — ED Notes (Signed)
Pt's belongings wanded by security. Found meth and heroin in the bag, handed to security.

## 2021-02-07 NOTE — Progress Notes (Signed)
Mother has additional information today:  Found 3 empty bottles-- thinks this is what she took, 1 x Ambien, 2x [place].  She is supposed to be on daily gabapentin, clonazepam, and buprenorphrine. She is running out of buprenorphrine early, so her mom worries she has taken too much of this.  Per the patient's friend she has been smoking meth and heroin recently and thinks she brought a backpack in with meth in the backpack. RN notified to please have security check her personal items; mother requested this as well.  Julian Hy, DO 02/07/21 11:31 AM [facility]

## 2021-02-07 NOTE — ED Notes (Signed)
Patient is sleeping will arouse to sternal rub

## 2021-02-07 NOTE — Consult Note (Addendum)
Consult Note   Carmen Daniels MVH:[id number] DOB: 16-Jun-1985 DOA: 02/07/2021  PCP: [name], MD Consultants:  Krista Blue - neurology; Rip Harbour - GYN Patient coming from:  Home; NOK: Myrtis Ser, [phone]  Chief Complaint: Overdose  HPI: Carmen Daniels is a 35 y.o. female with medical history significant of bipolar/borderline personality d/o/OCD/schizoaffective d/o and chronic pain syndrome/fibromyalgia presenting with intentional overdose.  It is not clear what she took, as there were many empty bottles around her per EMS.  She was responding to sternal rub prior to my evaluation but at the time of my evaluation was completely obtunded with no response to deep sternal rub or jaw thrust.  Nasal trumpet was placed.    ED Course: Intentional overdose - Ambien, Klonopin, ?others.  Poison control recommends observing.  Very somnolent.  HR 55, BP 92/40, maintaining airway as low as 92%.  Review of Systems: Unable to perform   Past Medical History:  Diagnosis Date   Allergic rhinitis    Anxiety    Asthma    Bipolar disorder ([place])    Borderline personality disorder ([place])    Bupropion overdose    Chronic pain syndrome    Depression    Fibromyalgia    generalized   Hallucinations    Migraines    Nasal polyps    OCD (obsessive compulsive disorder)    Ovarian cyst    left   Pneumonia    2021   PONV (postoperative nausea and vomiting)    Schizophrenia ([place])    schizoaffective    Past Surgical History:  Procedure Laterality Date   ABDOMINAL HYSTERECTOMY     LAPAROSCOPIC TUBAL LIGATION Bilateral 06/13/2019   Procedure: LAPAROSCOPIC TUBAL LIGATION;  Surgeon: Chancy Milroy, MD;  Location: [facility];  Service: Gynecology;  Laterality: Bilateral;  FILSHIE CLIPS   NASAL SINUS SURGERY     TUBAL LIGATION     tubes in ears     VAGINAL HYSTERECTOMY Bilateral 01/15/2020   Procedure: HYSTERECTOMY VAGINAL;  Surgeon: Chancy Milroy, MD;  Location: [facility];  Service:  Gynecology;  Laterality: Bilateral;   WISDOM TOOTH EXTRACTION      Social History   Socioeconomic History   Marital status: Married    Spouse name: Not on file   Number of children: 0   Years of education: 14   Highest education level: Not on file  Occupational History   Occupation: Unemployed  Tobacco Use   Smoking status: Every Day    Packs/day: 0.50    Years: 15.00    Pack years: 7.50    Types: Cigarettes   Smokeless tobacco: Never  Vaping Use   Vaping Use: Every day   Substances: Nicotine  Substance and Sexual Activity   Alcohol use: Never   Drug use: Yes    Comment: prescribed narcotics   Sexual activity: Not on file  Other Topics Concern   Not on file  Social History Narrative   Lives with parents.   Right-handed.   3 glasses of green tea per day.   Social Determinants of Health   Financial Resource Strain: Not on file  Food Insecurity: No Food Insecurity   Worried About Charity fundraiser in the Last Year: Never true   Ran Out of Food in the Last Year: Never true  Transportation Needs: Not on file  Physical Activity: Not on file  Stress: Not on file  Social Connections: Not on file  Intimate Partner Violence: Not on file  Allergies  Allergen Reactions   Keflex [Cephalexin] Anaphylaxis    Family History  Problem Relation Age of Onset   Healthy Mother    Healthy Father    Arthritis Maternal Grandmother    Arthritis Maternal Grandfather    Depression Paternal Grandmother    Hyperlipidemia Other    Stroke Other    Hypertension Other    Cancer Other    COPD Other    Asthma Other     Prior to Admission medications   Medication Sig Start Date End Date Taking? Authorizing Provider  beclomethasone (QVAR) 40 MCG/ACT inhaler Inhale 1 puff into the lungs 2 (two) times daily as needed (asthma).     [provider]  botulinum toxin Type A (BOTOX) 100 units SOLR injection Inject 200 Units into the muscle every 3 (three) months. Inject into  head and neck muscles 04/30/19   Marcial Pacas, MD  buprenorphine (SUBUTEX) 2 MG SUBL SL tablet Place under the tongue daily.    [provider]  clonazePAM (KLONOPIN) 1 MG tablet Take 1 mg by mouth 4 (four) times daily. 07/14/19   [provider]  Fremanezumab-vfrm (AJOVY) 225 MG/1.5ML SOSY INJECT 225 MG INTO THE SKIN EVERY 30 (THIRTY) DAYS. 01/05/21   Marcial Pacas, MD  gabapentin (NEURONTIN) 400 MG capsule Take 400 mg by mouth 3 (three) times daily.    [provider]  Multiple Vitamins-Calcium (ONE-A-DAY WOMENS FORMULA PO) Take 1 tablet by mouth daily.    [provider]  OXcarbazepine (TRILEPTAL) 150 MG tablet Take 2 tablets (300 mg total) by mouth 2 (two) times daily. 08/19/20   Marcial Pacas, MD  SUMAtriptan (IMITREX) 100 MG tablet TAKE 1 TABLET BY MOUTH EVERY 2 HOURS AS NEEDED FOR MIGRAINE. MAY DOSE 2 PER DAY OR 3 TIMES A WEEK. 12 TABLETS PER 30 DAYS, NO EARLY REFILL 12/15/20   Marcial Pacas, MD  traZODone (DESYREL) 50 MG tablet Take 50 mg by mouth at bedtime as needed for sleep. for sleep 04/05/20   [provider]  zolpidem (AMBIEN) 10 MG tablet Take 10 mg by mouth at bedtime. 07/28/20   [provider]    Physical Exam: Vitals:   02/07/21 0545 02/07/21 0600 02/07/21 0645 02/07/21 0700  BP: (!) 91/44 (!) 90/45 (!) 98/43 (!) 92/44  Pulse: (!) 58 (!) 56 (!) 55 (!) 55  Resp: (!) 30 (!) 24 14 14   Temp:      TempSrc:      SpO2: 94% 91% 92% 90%  Weight:      Height:         General:  Obtunded, snoring heavily, blue hair with confluent tattoos along her neck and arms Eyes:  clear sclera, no response to forced opening, pinpoint pupils ENT:  mildly dry lips & tongue Neck:  no LAD, masses or thyromegaly Cardiovascular:  RRR, no m/r/g. No LE edema.  Respiratory:   CTA bilaterally with no wheezes/rales/rhonchi.  Normal to mildly increased respiratory effort with generally normal O2 sats on room air. Abdomen:  soft, NT, ND Skin:  no rash or induration  seen on limited exam; many tattoos Musculoskeletal:  no bony abnormality Psychiatric:  obtunded, snoring loudly, no response to sternal rub Neurologic:  unable to perform    Radiological Exams on Admission: Independently reviewed - see discussion in A/P where applicable  CT Head Wo Contrast  Result Date: 02/07/2021 CLINICAL DATA:  35 year old female with history of mental status change. Unresponsive patient. Drug overdose. EXAM: CT HEAD WITHOUT CONTRAST  TECHNIQUE: Contiguous axial images were obtained from the base of the skull through the vertex without intravenous contrast. COMPARISON:  Head CT 06/29/2020. FINDINGS: Brain: No evidence of acute infarction, hemorrhage, hydrocephalus, extra-axial collection or mass lesion/mass effect. Vascular: No hyperdense vessel or unexpected calcification. Skull: Normal. Negative for fracture or focal lesion. Sinuses/Orbits: No acute finding. Other: None. IMPRESSION: 1. No acute intracranial abnormalities. The appearance of the brain is normal. Electronically Signed   By: Vinnie Langton M.D.   On: 02/07/2021 06:52    EKG: Independently reviewed.  NSR with rate 62; prolonged QTc 562; nonspecific ST changes with no evidence of acute ischemia   Labs on Admission: I have personally reviewed the available labs and imaging studies at the time of the admission.  Pertinent labs:   ABG: 7.311/50.4/71 K+ 2.9 Unremarkable CBC APAP <10 ASA <7   Assessment/Plan Principal Problem:   Intentional overdose ([place])   -Patient with multiple psychiatric illnesses presenting with what was reported to be an intentional overdose of unknown substances -Tylenol negative -Salicylate negative -UDS pending -UA pending -Poison Control was contacted by the ER and they suggest overnight observation prior to psych consult -Based on concern for inability to protect airway, I have consulted PCCM and anticipate that she will be admitted to their service -Will need sitter and  suicide precautions when awake -Dr. Almyra Free to complete IVC paperwork -Will need psychiatry consult once medically cleared    Thank you for this interesting consult.  Based on critical condition of the patient at this time, will defer admission to PCCM.    Total critical care time: 45 minutes Critical care time was exclusive of separately billable procedures and treating other patients. Critical care was necessary to treat or prevent imminent or life-threatening deterioration. Critical care was time spent personally by me on the following activities: development of treatment plan with patient and/or surrogate as well as nursing, discussions with consultants, evaluation of patient's response to treatment, examination of patient, obtaining history from patient or surrogate, ordering and performing treatments and interventions, ordering and review of laboratory studies, ordering and review of radiographic studies, pulse oximetry and re-evaluation of patient's condition.    Karmen Bongo MD [facility]   How to contact the [facility] Attending or Consulting provider [place] or covering provider during after hours [place], for this patient?  Check the care team in [facility] and look for a) attending/consulting [facility] provider listed and b) the [facility] team listed Log into [email or web address] and use [place]'s universal password to access. If you do not have the password, please contact the hospital operator. Locate the [facility] provider you are looking for under [facility] and page to a number that you can be directly reached. If you still have difficulty reaching the provider, please page the [facility] (Director on Call) for the Hospitalists listed on amion for assistance.   02/07/2021, 8:24 AM

## 2021-02-07 NOTE — Procedures (Signed)
Intubation Procedure Note  YAZMINE SOREY  [id number]  [birth year]  Date:02/07/21  Time:8:52 AM   Provider Performing:Jeffren Dombek P Carlis Abbott    Procedure: Intubation (09233)  Indication(s) Respiratory Failure  Consent Unable to obtain consent due to emergent nature of procedure.   Anesthesia Succinylcholine and fentanyl 23mcg   Time Out Verified patient identification, verified procedure, site/side was marked, verified correct patient position, special equipment/implants available, medications/allergies/relevant history reviewed, required imaging and test results available.   Sterile Technique Usual hand hygeine, masks, and gloves were used   Procedure Description Patient positioned in bed supine.  Sedation given as noted above.  Patient was intubated with endotracheal tube using Glidescope.  View was Grade 1 full glottis .  Number of attempts was 1.  Colorimetric CO2 detector was consistent with tracheal placement.   Complications/Tolerance None; patient tolerated the procedure well. Chest X-ray is ordered to verify placement.   EBL 0   Specimen(s) None  Julian Hy, DO 02/07/21 8:52 AM [facility]

## 2021-02-07 NOTE — Progress Notes (Deleted)
U/S guided PIV access obtained for vasopressors. Chloe, RN aware of the need for an iWatch in place.

## 2021-02-07 NOTE — ED Notes (Signed)
Did not respond to narcan.

## 2021-02-07 NOTE — ED Notes (Signed)
Transported to CT 

## 2021-02-07 NOTE — H&P (Signed)
NAME:  Carmen Daniels, MRN:  [id number], DOB:  March 08, 1985, LOS: 0 ADMISSION DATE:  02/07/2021, CONSULTATION DATE:  02/07/21 REFERRING MD:  Lorin Mercy - TRH, CHIEF COMPLAINT:  Obtunded    History of Present Illness:  35 yo F PMH Bipolar disorder, OCD, Depression with SI, fibromyalgia, CPS, BPD, migraines,  who presented to ED 12/17 following suspected intentional overdose. Pt was reportedly on phone with a friend when she took " 300mg " of undisclosed substance and that she "no longer wanted to live and wanted to end her life." EMS was called and found multiple empty bottles next to patient including ambien, clonazepam, sumatriptan -- unknown if other substances ingested concomitantly. EMS reported that on their arrival pt denied SI. Narcan was given by EMS without change in status. In ED, pt noted to briefly arouse only to sternal rub. Poison control was contacted and recommended close observation.   Pt was admitted to [facility] but on follow up exam noted to be obtunded with agonal respirations and no response to deep sternal rub or jaw thrust. Nasal trumpet placed and PCCM called for emergent intubation and ICU admission    PCCM consulted in this setting.   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 Agonal respirations in ED ultimately CCM consulted and then intubated. Admit to ICU   Interim History / Subjective:  A nasal trumpet has been placed as patient was found to be unresponsive to deep sternal rub and to jaw thrust.  A foley is being placed.   She is completely obtunded with no response to deep sternal rub, no cough/gag. Agonal respirations.   Objective   Blood pressure 106/64,  pulse (!) 49, temperature 98 F (36.7 C), temperature source Oral, resp. rate 14, height 5\' 11"  (1.803 m), weight 79.4 kg, SpO2 100 %.    Vent Mode: PRVC FiO2 (%):  [100 %] 100 % Set Rate:  [24 bmp] 24 bmp Vt Set:  [560 mL] 560 mL PEEP:  [5 cmH20] 5 cmH20 Plateau Pressure:  [13 cmH20] 13 cmH20  No intake or output data in the 24 hours ending 02/07/21 0947 Filed Weights   02/07/21 0156  Weight: 79.4 kg    Examination: General: wdwn chronically ill adult F obtunded HENT: NCAT. Nasal trumpet in place. Dry mm.  Lungs: Agonal respirations. CTAb  Cardiovascular: regular, intermittently sinus brady. Cap refill brisk  Abdomen: soft ndnt + bowel sounds  Extremities: BLE muscle atrophy, symmetrical  Neuro: Obtunded. No response to painful stimuli.  GU: wnl Skin: Healed cutting marks on lower extremity. Scattered tattoos. C/d/w/pale   Resolved Hospital Problem list     Assessment & Plan:   Acute toxic metabolic encephalopathy due to intentional overdose Presumed suicide attempt  -found with empty bottles on ambien, clonazepam, sumatriptan. May have access to prior psych and pain meds which include SSRIs, AEDs, Antipsychotics, opiates -also suspect hypercarbia may be contributing in slight to encephalopathy -- pt with marginal increase in responsiveness after minutes of BVM. -UDS + for BZD and amphetamines -- family reports recent use of cough/cold meds when feeling unwell P -hold home meds -supportive care -PRN prop for sedation -goal RASS 0 to -1   Acute respiratory failure with hypercarbia due to hypoventilation in setting of toxic metabolic  encephalopathy above Hx Asthma -agonal respirations with nasal trumpet in place -home Qvar  P -intubate now.  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/2020  P -no acute intervention  Hypokalemia P -will give 4g mag and additional K replacement  -afternoon BMP  Borderline bradycardia Borderline hypotension -suspect medication related superimposed on baseline (OP Bps 100-115)  P -cardiac monitoring -will give additional fluids     Best Practice (right click and "Reselect all SmartList Selections" daily)   Diet/type: tubefeeds DVT prophylaxis: prophylactic heparin  GI prophylaxis: PPI Lines: N/A Foley:  Yes, and it is still needed Code Status:  full code Last date of multidisciplinary goals of care discussion [02/07/21]  Labs   CBC: Recent Labs  Lab 02/07/21 0200 02/07/21 0544  WBC 6.5  --   NEUTROABS 3.9  --   HGB 15.3* 12.9  HCT 45.7 38.0  MCV 85.9  --   PLT 323  --     Basic Metabolic Panel: Recent Labs  Lab 02/07/21 0200 02/07/21 0544  NA 139 141  K 2.9* 2.9*  CL 104  --   CO2 25  --   GLUCOSE 116*  --   BUN <5*  --   CREATININE 0.73  --   CALCIUM 9.8  --    GFR: Estimated Creatinine Clearance: 108.7 mL/min (by C-G formula based on SCr of 0.73 mg/dL). Recent Labs  Lab 02/07/21 0200  WBC 6.5    Liver Function Tests: Recent Labs  Lab 02/07/21 0200  AST 25  ALT 19  ALKPHOS 58  BILITOT 1.1  PROT 7.0  ALBUMIN 4.1   No results for input(s): LIPASE, AMYLASE in the last 168 hours. No results for input(s): AMMONIA in the last 168 hours.  ABG    Component Value Date/Time   PHART 7.311 (L) 02/07/2021 0544   PCO2ART 50.4 (H) 02/07/2021 0544    PO2ART 71 (L) 02/07/2021 0544   HCO3 25.5 02/07/2021 0544   TCO2 27 02/07/2021 0544   ACIDBASEDEF 1.0 02/07/2021 0544   O2SAT 93.0 02/07/2021 0544     Coagulation Profile: No results for input(s): INR, PROTIME in the last 168 hours.  Cardiac Enzymes: No results for input(s): CKTOTAL, CKMB, CKMBINDEX, TROPONINI in the last 168 hours.  HbA1C: No results found for: HGBA1C  CBG: No results for input(s): GLUCAP in the last 168 hours.  Review of Systems:   Unable to obtain, obtunded.   Past Medical History:  She,  has a past medical history of Allergic rhinitis, Anxiety, Asthma, Bipolar disorder ([place]), Borderline personality disorder ([place]), Bupropion overdose, Chronic pain syndrome, Depression, Fibromyalgia, Hallucinations, Migraines, Nasal polyps, OCD (obsessive compulsive disorder), Ovarian cyst, Pneumonia, PONV (postoperative nausea and vomiting), and Schizophrenia ([place]).   Surgical History:   Past Surgical History:  Procedure Laterality Date   ABDOMINAL HYSTERECTOMY     LAPAROSCOPIC TUBAL LIGATION Bilateral 06/13/2019   Procedure: LAPAROSCOPIC TUBAL LIGATION;  Surgeon: Chancy Milroy, MD;  Location: [facility];  Service: Gynecology;  Laterality: Bilateral;  FILSHIE CLIPS   NASAL SINUS SURGERY     TUBAL LIGATION     tubes in ears     VAGINAL HYSTERECTOMY Bilateral 01/15/2020   Procedure: HYSTERECTOMY VAGINAL;  Surgeon: Chancy Milroy, MD;  Location: [facility];  Service: Gynecology;  Laterality: Bilateral;   WISDOM TOOTH EXTRACTION       Social History:   reports that she has been smoking cigarettes. She has a 7.50 pack-year smoking history. She has never used smokeless tobacco. She reports current drug use. She reports that she does not drink alcohol.   Family History:  Her family history includes Arthritis in her maternal grandfather and maternal grandmother; Asthma in an other family member; COPD in an other family member; Cancer in an other family member;  Depression in her paternal grandmother; Healthy in her father and mother; Hyperlipidemia in an other family member; Hypertension in an other family member; Stroke in an other family member.   Allergies Allergies  Allergen Reactions   Keflex [Cephalexin] Anaphylaxis     Home Medications  Prior to Admission medications   Medication Sig Start Date End Date Taking? Authorizing Provider  beclomethasone (QVAR) 40 MCG/ACT inhaler Inhale 1 puff into the lungs 2 (two) times daily as needed (asthma).     [provider]  botulinum toxin Type A (BOTOX) 100 units SOLR injection Inject 200 Units into the muscle every 3 (three) months. Inject into head and neck muscles 04/30/19   Marcial Pacas, MD  buprenorphine (SUBUTEX) 2 MG SUBL SL tablet Place under the tongue daily.    [provider]  clonazePAM (KLONOPIN) 1 MG tablet Take 1 mg by mouth 4 (four) times daily. 07/14/19   [provider]  Fremanezumab-vfrm (AJOVY) 225 MG/1.5ML SOSY INJECT 225 MG INTO THE SKIN EVERY 30 (THIRTY) DAYS. 01/05/21   Marcial Pacas, MD  gabapentin (NEURONTIN) 400 MG capsule Take 400 mg by mouth 3 (three) times daily.    [provider]  Multiple Vitamins-Calcium (ONE-A-DAY WOMENS FORMULA PO) Take 1 tablet by mouth daily.    [provider]  OXcarbazepine (TRILEPTAL) 150 MG tablet Take 2 tablets (300 mg total) by mouth 2 (two) times daily. 08/19/20   Marcial Pacas, MD  SUMAtriptan (IMITREX) 100 MG tablet TAKE 1 TABLET BY MOUTH EVERY 2 HOURS AS NEEDED FOR MIGRAINE. MAY DOSE 2 PER DAY OR 3 TIMES A WEEK. 12 TABLETS PER 30 DAYS, NO EARLY REFILL 12/15/20   Marcial Pacas, MD  traZODone (DESYREL) 50 MG tablet Take 50 mg by mouth at bedtime as needed for sleep. for sleep 04/05/20   [provider]  zolpidem (AMBIEN) 10 MG tablet Take 10 mg by mouth at bedtime. 07/28/20   [provider]     Critical care time: 68 minutes     CRITICAL CARE Performed by: Cristal Generous   Total critical  care time: 68 minutes  Critical care time was exclusive of separately billable procedures and treating other patients.  Critical care was necessary to treat or prevent imminent or life-threatening deterioration.  Critical care was time spent personally by me on the following activities: development of treatment plan with patient and/or surrogate as well as nursing, discussions with consultants, evaluation of patient's response to treatment, examination of patient, obtaining history from patient or surrogate, ordering and performing treatments and interventions, ordering and review of laboratory studies, ordering and review of radiographic  studies, pulse oximetry and re-evaluation of patient's condition.  Eliseo Gum MSN, AGACNP-BC [place] for pager  02/07/2021, 9:47 AM

## 2021-02-07 NOTE — ED Notes (Signed)
Levo stopped per MD at bedside.

## 2021-02-07 NOTE — Progress Notes (Addendum)
Called to bedside for emergent intubation; patient nonresponsive. When I got to bedside she was agonally breathing and bradycardic. BVM for several minutes prior to intubation while getting set up with slight improvement in spontaneous movement, but still not alert. Acutely hypercapneic on ABG several hours ago. Fentanyl & succinylcholine used during intubation. Propofol PRN for sedation once intubated. No evidence of aspiration during this. L/M for sister to call back for update.  Full consult note to follow.  Julian Hy, DO 02/07/21 8:55 AM [facility]   Sister [name] (physician in [place]) and mother (daughter lives in in-law suite at parents house) both updated via phone and provided additional history. Off psych meds, had been manic yesterday. Called the police on parents when they attempted to get her to see her Psychiatrist yesterday. Has been eating poorly for weeks-months.  Julian Hy, DO 02/07/21 9:24 AM [facility]

## 2021-02-07 NOTE — ED Notes (Signed)
Clean patient up x2 loose stools runny changed linen

## 2021-02-07 NOTE — Procedures (Signed)
Central Venous Catheter Insertion Procedure Note  GENAE STRINE  [id number]  August 07, 1985  Date:02/07/21  Time:1:17 PM   Provider Performing:Romelia Bromell   Procedure: Insertion of Non-tunneled Central Venous [phone]) with US guidance ([id number])   Indication(s) Medication administration  Consent Risks of the procedure as well as the alternatives and risks of each were explained to the patient and/or caregiver.  Consent for the procedure was obtained and is signed in the bedside chart  Anesthesia Topical only with 1% lidocaine   Timeout Verified patient identification, verified procedure, site/side was marked, verified correct patient position, special equipment/implants available, medications/allergies/relevant history reviewed, required imaging and test results available.  Sterile Technique Maximal sterile technique including full sterile barrier drape, hand hygiene, sterile gown, sterile gloves, mask, hair covering, sterile ultrasound probe cover (if used).  Procedure Description Area of catheter insertion was cleaned with chlorhexidine and draped in sterile fashion.  With real-time ultrasound guidance a central venous catheter was placed into the left subclavian vein. Nonpulsatile blood flow and easy flushing noted in all ports.  The catheter was sutured in place and sterile dressing applied.  Complications/Tolerance None; patient tolerated the procedure well. Chest X-ray is ordered to verify placement for internal jugular or subclavian cannulation.   Chest x-ray is not ordered for femoral cannulation.  EBL Minimal  Specimen(s) None

## 2021-02-07 NOTE — ED Notes (Signed)
Dr Almyra Free aware of current VS orders to bolus patient given.

## 2021-02-07 NOTE — ED Triage Notes (Signed)
Pt BIB EMS after a possible drug overdose. EMS states that pt was on the phone with a friend when she ingested an unknown amount of medications. The friend stated that the pt told her she had taken 300mg , but did not say of what. When EMS arrived on scene, they found several empty bottles next to pt. Pt does have a hx of suicide but states she does not feel suicidal at this time.   VS Stable with EMS.

## 2021-02-07 NOTE — Code Documentation (Signed)
Dr. Carlis Abbott with CCM at bedside for intubation

## 2021-02-07 NOTE — ED Notes (Signed)
Pt's valuables locked with ED security.

## 2021-02-08 LAB — BASIC METABOLIC PANEL
Anion gap: 3 — ABNORMAL LOW (ref 5–15)
Anion gap: 4 — ABNORMAL LOW (ref 5–15)
BUN: 7 mg/dL (ref 6–20)
BUN: 7 mg/dL (ref 6–20)
CO2: 24 mmol/L (ref 22–32)
CO2: 26 mmol/L (ref 22–32)
Calcium: 8.4 mg/dL — ABNORMAL LOW (ref 8.9–10.3)
Calcium: 7.9 mg/dL — ABNORMAL LOW (ref 8.9–10.3)
Chloride: 114 mmol/L — ABNORMAL HIGH (ref 98–111)
Chloride: 113 mmol/L — ABNORMAL HIGH (ref 98–111)
Creatinine, Ser: 0.67 mg/dL (ref 0.44–1.00)
Creatinine, Ser: 0.54 mg/dL (ref 0.44–1.00)
GFR, Estimated: >60 mL/min (ref >60)
GFR, Estimated: >60 mL/min (ref >60)
Glucose, Bld: 102 mg/dL — ABNORMAL HIGH (ref 70–99)
Glucose, Bld: 82 mg/dL (ref 70–99)
Potassium: 3.5 mmol/L (ref 3.5–5.1)
Potassium: 4.4 mmol/L (ref 3.5–5.1)
Sodium: 141 mmol/L (ref 135–145)
Sodium: 143 mmol/L (ref 135–145)

## 2021-02-08 LAB — CBC
HCT: 32.3 % — ABNORMAL LOW (ref 36.0–46.0)
Hemoglobin: 11 g/dL — ABNORMAL LOW (ref 12.0–15.0)
MCH: 29.2 pg (ref 26.0–34.0)
MCHC: 34.1 g/dL (ref 30.0–36.0)
MCV: 85.7 fL (ref 80.0–100.0)
Platelets: 325 10*3/uL (ref 150–400)
RBC: 3.77 MIL/uL — ABNORMAL LOW (ref 3.87–5.11)
RDW: 13.2 % (ref 11.5–15.5)
WBC: 11.6 10*3/uL — ABNORMAL HIGH (ref 4.0–10.5)
nRBC: 0 % (ref 0.0–0.2)

## 2021-02-08 LAB — GLUCOSE, CAPILLARY
Glucose-Capillary: 105 mg/dL — ABNORMAL HIGH (ref 70–99)
Glucose-Capillary: 139 mg/dL — ABNORMAL HIGH (ref 70–99)
Glucose-Capillary: 64 mg/dL — ABNORMAL LOW (ref 70–99)
Glucose-Capillary: 66 mg/dL — ABNORMAL LOW (ref 70–99)
Glucose-Capillary: 88 mg/dL (ref 70–99)
Glucose-Capillary: 97 mg/dL (ref 70–99)
Glucose-Capillary: 86 mg/dL (ref 70–99)
Glucose-Capillary: 121 mg/dL — ABNORMAL HIGH (ref 70–99)

## 2021-02-08 LAB — C DIFFICILE QUICK SCREEN W PCR REFLEX
C Diff antigen: NEGATIVE
C Diff interpretation: NOT DETECTED
C Diff toxin: NEGATIVE

## 2021-02-08 LAB — MAGNESIUM
Magnesium: 2.2 mg/dL (ref 1.7–2.4)
Magnesium: 2 mg/dL (ref 1.7–2.4)
Magnesium: 1.9 mg/dL (ref 1.7–2.4)

## 2021-02-08 LAB — PHOSPHORUS
Phosphorus: <1 mg/dL — CL (ref 2.5–4.6)
Phosphorus: 3.4 mg/dL (ref 2.5–4.6)
Phosphorus: 2.7 mg/dL (ref 2.5–4.6)

## 2021-02-08 MED ORDER — ACETAMINOPHEN 160 MG/5ML PO SOLN
650.0000 mg | ORAL | Status: DC | PRN
  Administered 2021-02-08 (×2): 650 mg
  Filled 2021-02-08 (×2): qty 20.3

## 2021-02-08 MED ORDER — SODIUM CHLORIDE 0.9% FLUSH
10.0000 mL | Freq: Two times a day (BID) | INTRAVENOUS | Status: DC
  Administered 2021-02-08 – 2021-02-18 (×20): 10 mL

## 2021-02-08 MED ORDER — DOCUSATE SODIUM 100 MG PO CAPS
100.0000 mg | ORAL_CAPSULE | Freq: Two times a day (BID) | ORAL | Status: DC
  Administered 2021-02-08 – 2021-02-11 (×2): 100 mg via ORAL
  Filled 2021-02-08 (×8): qty 1

## 2021-02-08 MED ORDER — LACTATED RINGERS IV BOLUS: 1000.0000 mL | Freq: Once | INTRAVENOUS | Status: AC

## 2021-02-08 MED ORDER — DEXMEDETOMIDINE HCL IN NACL 400 MCG/100ML IV SOLN
0.4000 ug/kg/h | INTRAVENOUS | Status: DC
  Administered 2021-02-08: 08:00:00 0.4 ug/kg/h via INTRAVENOUS
  Filled 2021-02-08: qty 100

## 2021-02-08 MED ORDER — NICOTINE 14 MG/24HR TD PT24
14.0000 mg | MEDICATED_PATCH | Freq: Every day | TRANSDERMAL | Status: DC
  Administered 2021-02-09 – 2021-02-19 (×11): 14 mg via TRANSDERMAL
  Filled 2021-02-08 (×11): qty 1

## 2021-02-08 MED ORDER — PANTOPRAZOLE 2 MG/ML SUSPENSION
40.0000 mg | Freq: Every day | ORAL | Status: DC
  Administered 2021-02-08: 10:00:00 40 mg
  Filled 2021-02-08: qty 20

## 2021-02-08 MED ORDER — LACTATED RINGERS IV SOLN: INTRAVENOUS | Status: DC

## 2021-02-08 MED ORDER — POTASSIUM PHOSPHATES 15 MMOLE/5ML IV SOLN
30.0000 mmol | Freq: Once | INTRAVENOUS | Status: AC
  Administered 2021-02-08: 06:00:00 30 mmol via INTRAVENOUS
  Filled 2021-02-08: qty 10

## 2021-02-08 MED ORDER — ORAL CARE MOUTH RINSE
15.0000 mL | Freq: Two times a day (BID) | OROMUCOSAL | Status: DC
  Administered 2021-02-08 – 2021-02-18 (×17): 15 mL via OROMUCOSAL

## 2021-02-08 MED ORDER — SODIUM BICARBONATE 8.4 % IV SOLN
INTRAVENOUS | Status: AC
  Filled 2021-02-08: qty 50

## 2021-02-08 MED ORDER — PANTOPRAZOLE SODIUM 40 MG PO TBEC: 40.0000 mg | DELAYED_RELEASE_TABLET | Freq: Every day | ORAL | Status: DC

## 2021-02-08 MED ORDER — LACTATED RINGERS IV BOLUS
1000.0000 mL | Freq: Once | INTRAVENOUS | Status: AC
  Administered 2021-02-08: 08:00:00 1000 mL via INTRAVENOUS

## 2021-02-08 MED ORDER — SODIUM BICARBONATE 8.4 % IV SOLN
50.0000 meq | Freq: Once | INTRAVENOUS | Status: AC
  Administered 2021-02-08: 11:00:00 50 meq via INTRAVENOUS
  Filled 2021-02-08: qty 50

## 2021-02-08 MED ORDER — SODIUM BICARBONATE 8.4 % IV SOLN: 50.0000 meq | Freq: Once | INTRAVENOUS | Status: AC

## 2021-02-08 MED ORDER — SODIUM CHLORIDE 0.9% FLUSH: 10.0000 mL | INTRAVENOUS | Status: DC | PRN

## 2021-02-08 NOTE — Progress Notes (Signed)
EEG was attempted earlier today and canceled by attending post extubation. Nurse will place discontinue EEG order when system re-available.

## 2021-02-08 NOTE — Procedures (Signed)
Extubation Procedure Note  Patient Details:   Name: Carmen Daniels DOB: February 11, 1986 MRN: [id number]   Airway Documentation:    Vent end date: 02/08/21 Vent end time: 1030   Evaluation  O2 sats: stable throughout Complications: No apparent complications Patient did tolerate procedure well. Bilateral Breath Sounds: Clear, Diminished   Yes, pt could speak post extubation.  Pt extubated to room air per physician's order.    Earney Navy 02/08/2021, 10:32 AM

## 2021-02-08 NOTE — Progress Notes (Addendum)
NAME:  Carmen Daniels, MRN:  [id number], DOB:  [birth year], LOS: 1 ADMISSION DATE:  02/07/2021, CONSULTATION DATE:  02/07/21 REFERRING MD:  Lorin Mercy - TRH, CHIEF COMPLAINT:  Obtunded    History of Present Illness:  35 yo F PMH Bipolar disorder, OCD, Depression with SI, fibromyalgia, CPS, BPD, migraines,  who presented to ED 12/17 following suspected intentional overdose. Pt was reportedly on phone with a friend when she took " 300mg " of undisclosed substance and that she "no longer wanted to live and wanted to end her life." EMS was called and found multiple empty bottles next to patient including ambien, clonazepam, sumatriptan -- unknown if other substances ingested concomitantly. EMS reported that on their arrival pt denied SI. Narcan was given by EMS without change in status. In ED, pt noted to briefly arouse only to sternal rub. Poison control was contacted and recommended close observation.   Pt was admitted to [facility] but on follow up exam noted to be obtunded with agonal respirations and no response to deep sternal rub or jaw thrust. Nasal trumpet placed and PCCM called for emergent intubation and ICU admission    PCCM consulted in this setting.   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 Agonal respirations in ED ultimately CCM consulted and then intubated. Admit to ICU  Yesterday started on epi gtt for bradycardia and hypotension  Interim History / Subjective:  Subclavian line placed yesterday for initiation of epi gtt. Line was malpositioned so removed and replaced with R IJ CVC for medication administration.  Patient drowsy this am but able to follow simple commands.  Remains on epi gtt and mechanical ventilation  Objective   Blood pressure 102/70, pulse 82, temperature 97.7 F (36.5 C), temperature source Bladder, resp. rate 11, height 5\' 11"  (1.803 m), weight 70.7 kg, SpO2 100 %. CVP:  [2 mmHg-36 mmHg] 36 mmHg  Vent Mode: PRVC FiO2 (%):  [40 %-100 %] 40 % Set Rate:  [18 bmp-24 bmp] 18 bmp Vt Set:  [560 mL] 560 mL PEEP:  [5 cmH20] 5 cmH20 Plateau Pressure:  [12 cmH20-14 cmH20] 13 cmH20   Intake/Output Summary (Last 24 hours) at 02/08/2021 0824 Last data filed at 02/08/2021 0800 Gross per 24 hour  Intake 3686.63 ml  Output 898 ml  Net 2788.63 ml   Filed Weights   02/07/21 0156 02/08/21 0500  Weight: 79.4 kg 70.7 kg    Physical Exam: General: Young, chronically ill-appearing, drowsy HENT: [place], AT, ETT in place Eyes: EOMI, no scleral icterus Respiratory: Clear to auscultation bilaterally.  No crackles, wheezing or rales Cardiovascular: RRR, -M/R/G, no JVD GI: BS+, soft, nontender Extremities:-Edema,-tenderness Neuro: Drowsy, opens eyes to voice, follows commands, moves extremities x 4, CN II-XII grossly intact Skin: Tattoos  WBC 11.6 Hg 11, stable K 3.5 Phos <1  Resolved Hospital Problem list     Assessment & Plan:   Acute toxic metabolic encephalopathy due to intentional overdose - improving Presumed suicide attempt  -found with empty bottles on ambien, clonazepam, sumatriptan. May have access to prior psych and pain meds which include SSRIs, AEDs, Antipsychotics, opiates -also suspect hypercarbia may be contributing in slight to encephalopathy -- pt with marginal increase in responsiveness after minutes of BVM. -UDS + for BZD and amphetamines --  family reports recent use of cough/cold meds when feeling unwell Plan -EKG q6h to monitor Qtc. AVOID QTC prolonging drugs -Trend K, Phos, Mg, Ca -hold home meds -supportive care -Transition from propofol to Precedex for goal RASS 0 to -1   Acute respiratory failure with hypercarbia  due to hypoventilation in setting of toxic metabolic encephalopathy above Hx Asthma -agonal respirations with nasal trumpet in place -home Qvar  P -Full vent support -SBT this am. Potentially extubate once off pressors -VAP  Drug-induced hypotension and bradycardia -suspect medication related superimposed on baseline (OP Bps 100-115)  P -Wean off Epi gtt -LR bolus and mIVF -MAP goal >65 -Telemetry  Suicidal Ideation  Bipolar disorder 1 Schizoaffective disorder BPD  OCD Prior SI/SA -Rx Ambien, Clonazepam -pt has taken self off of prior psych meds but may still have access to these meds at home: seroquel, cymbalta, trazodone P -will need psych this admission when medically appropriate   Fibromyalgia Chronic pain Migraine Gait disturbance  -Rx buprenorphine, gabapentin, topamax -May still have access to oxcarbazepine, oxycodone  P -hold home meds   Hx Rhabdo Hx compartment syndrome Mild axonal peripheral neuropathy  -due to rhabdo with BLE compartment syndrome in 06/2020  P -no acute intervention  Hypokalemia - improving P -Trend -Repleted      Best Practice (right click and "Reselect all SmartList Selections" daily)   Diet/type: tubefeeds DVT prophylaxis: prophylactic heparin  GI prophylaxis: PPI Lines: Central line Foley:  Yes, and it is still needed Code Status:  full code Last date of multidisciplinary goals of care discussion [02/07/21]  Labs   CBC: Recent Labs  Lab 02/07/21 0200 02/07/21 0544 02/07/21 0918 02/07/21 1052 02/08/21 0334  WBC 6.5  --  8.2  --  11.6*  NEUTROABS 3.9  --   --   --   --   HGB 15.3* 12.9 11.1* 10.5* 11.0*  HCT 45.7 38.0 32.5* 31.0* 32.3*  MCV 85.9  --  87.8  --  85.7  PLT 323  --  206  --  009    Basic Metabolic Panel: Recent Labs  Lab 02/07/21 0200 02/07/21 0544 02/07/21 0918 02/07/21 1052 02/07/21 1849 02/08/21 0334  NA 139 141  --  143 139 141  K 2.9* 2.9*  --  3.3* 2.3* 3.5  CL 104  --   --   --   111 114*  CO2 25  --   --   --  18* 24  GLUCOSE 116*  --   --   --  280* 102*  BUN <5*  --   --   --  7 7  CREATININE 0.73  --  0.62  --  0.80 0.67  CALCIUM 9.8  --   --   --  8.5* 8.4*  MG  --   --  1.6*  --  1.6* 2.2  PHOS  --   --  1.7*  --  1.1* <1.0*   GFR: Estimated Creatinine Clearance: 108.5 mL/min (by C-G formula based on SCr of 0.67 mg/dL). Recent Labs  Lab 02/07/21 0200 02/07/21 0918 02/08/21 0334  WBC 6.5 8.2 11.6*    Liver Function Tests: Recent Labs  Lab 02/07/21 0200  AST 25  ALT 19  ALKPHOS 58  BILITOT 1.1  PROT 7.0  ALBUMIN 4.1   No results for input(s): LIPASE, AMYLASE in the last 168 hours. No results for input(s): AMMONIA in the last 168 hours.  ABG    Component Value Date/Time   PHART  7.445 02/07/2021 1052   PCO2ART 31.5 (L) 02/07/2021 1052   PO2ART 452 (H) 02/07/2021 1052   HCO3 21.7 02/07/2021 1052   TCO2 23 02/07/2021 1052   ACIDBASEDEF 2.0 02/07/2021 1052   O2SAT 100.0 02/07/2021 1052     Coagulation Profile: No results for input(s): INR, PROTIME in the last 168 hours.  Cardiac Enzymes: Recent Labs  Lab 02/07/21 2200  CKTOTAL 177    HbA1C: Hgb A1c MFr Bld  Date/Time Value Ref Range Status  02/07/2021 10:20 PM 4.7 (L) 4.8 - 5.6 % Final    Comment:    (NOTE) Pre diabetes:          5.7%-6.4%  Diabetes:              >6.4%  Glycemic control for   <7.0% adults with diabetes     CBG: Recent Labs  Lab 02/07/21 1909 02/07/21 2159 02/07/21 2340 02/08/21 0312 02/08/21 0758  GLUCAP 239* 174* 144* 105* 139*    Critical care time: 45 minutes    The patient is critically ill with multiple organ systems failure and requires high complexity decision making for assessment and support, frequent evaluation and titration of therapies, application of advanced monitoring technologies and extensive interpretation of multiple databases.   Rodman Pickle, M.D. [facility] Pulmonary/Critical Care Medicine 02/08/2021 8:24 AM   Please  see Amion for pager number to reach on-call Pulmonary and Critical Care Team.

## 2021-02-08 NOTE — Progress Notes (Signed)
eLink Physician-Brief Progress Note Patient Name: Carmen Daniels DOB: 1985/07/08 MRN: [id number]   Date of Service  02/08/2021  HPI/Events of Note  Received request for nicotine patch or gum. Smokes 1/2 to 1 PPD  eICU Interventions  Nicotine replacement protocol ordered     Intervention Category Minor Interventions: Routine modifications to care plan (e.g. PRN medications for pain, fever)  Shona Needles Nollie Terlizzi 02/08/2021, 10:32 PM

## 2021-02-08 NOTE — Progress Notes (Signed)
PCCM Progress Note  S:Extubated this am. Tolerating diet.  Weaned off pressors and sedation  Blood pressure (!) 101/52, pulse 69, temperature (!) 96.8 F (36 C), temperature source Bladder, resp. rate (!) 21, height 5\' 11"  (1.803 m), weight 70.7 kg, SpO2 98 %.  EKGs reviewed with improving Qtc <550 ms  Plan -Continue to trend EKG to monitor QTC -Psych consulted placed -Transfer to [facility] tomorrow

## 2021-02-08 NOTE — Progress Notes (Signed)
[facility] Progress Note Patient Name: Carmen Daniels DOB: Apr 15, 1985 MRN: [id number]   Date of Service  02/08/2021  HPI/Events of Note  K 3.5 Phos < 1  eICU Interventions  Ordered Kphos      Intervention Category Minor Interventions: Electrolytes abnormality - evaluation and management  Tilden Dome 02/08/2021, 4:33 AM

## 2021-02-08 NOTE — TOC Initial Note (Signed)
Transition of Care [facility]) - Initial/Assessment Note    Patient Details  Name: Carmen Daniels MRN: [id number] Date of Birth: [birth year]  [facility] CM/SW Contact:    Loreta Ave, [place] Phone Number: 02/08/2021, 3:34 PM  Clinical Narrative:                 CSW received consult to speak to pt's mother about rehab, CSW spoke to pt's mom via phone. Pt's mom stated that pt cannot return to her home at dc and that the hospital needs to work to find a placement for pt. Pt's mom stated that pt gets an SSI check each month, around $700 and has Medicaid. Pt's mom states she has taken care of pt all of her life but the last seven months have been too much. CSW explained that the hospital typically doesn't do longterm placements due to the amount of time it takes, pt's mom reiterated that pt would not be returning to her home. Pt currently uses a walker to ambulate per mom.         Patient Goals and CMS Choice        Expected Discharge Plan and Services                                                Prior Living Arrangements/Services                       Activities of Daily Living      Permission Sought/Granted                  Emotional Assessment              Admission diagnosis:  Endotracheal tube present [Z97.8] Central venous catheter in place [Z78.9] Intentional overdose ([place]) [T50.902A] Intentional overdose, initial encounter [facility]) [T50.902A] Patient Active Problem List   Diagnosis Date Noted   Intentional overdose ([place]) 02/07/2021   Weakness 08/19/2020   Gait abnormality 08/19/2020   Neuropathic pain 08/19/2020   Malnutrition of moderate degree 07/01/2020   Acute renal failure ([place]) 06/30/2020   Non-traumatic rhabdomyolysis    Elevated LFTs    Tardive dyskinesia 11/14/2019   Bipolar I disorder, most recent episode depressed ([place]) 09/13/2019   Social anxiety disorder 09/13/2019   Adjustment disorder with mixed anxiety  and depressed mood 07/31/2019   Suicidal ideation    Post-operative state 07/19/2019   Seizures ([place]) 06/08/2019   Bipolar 1 disorder, depressed ([place]) 05/17/2019   Status epilepticus ([place]) 05/17/2019   Intentional benzodiazepine overdose ([place]) 03/29/2019   Intractable chronic 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 status migrainosus 02/08/2019   History of elective abortion 12/28/2018   QT prolongation 07/03/2018   AMS (altered mental status) 07/03/2018   Serum total bilirubin elevated 07/03/2018   Syncope 07/02/2018   Borderline personality disorder ([place]) 04/11/2017   Bipolar I disorder, current or most recent episode manic, with psychotic features ([place]) 04/10/2017   Bipolar disorder ([place]) 04/10/2017   Abnormal MRI of head 04/21/2016   Chronic migraine without aura 03/27/2015   Mild persistent asthma 12/30/2014   Allergic rhinitis due to pollen 12/30/2014   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 [address] Phone: [phone] Fax: [phone]     Social Determinants of Health (SDOH) Interventions    Readmission Risk Interventions No flowsheet data found.

## 2021-02-09 ENCOUNTER — Inpatient Hospital Stay (HOSPITAL_COMMUNITY): Payer: Medicaid Other

## 2021-02-09 DIAGNOSIS — T50902D Poisoning by unspecified drugs, medicaments and biological substances, intentional self-harm, subsequent encounter: Secondary | ICD-10-CM

## 2021-02-09 LAB — BASIC METABOLIC PANEL
Anion gap: 4 — ABNORMAL LOW (ref 5–15)
BUN: <5 mg/dL — ABNORMAL LOW (ref 6–20)
CO2: 27 mmol/L (ref 22–32)
Calcium: 8.3 mg/dL — ABNORMAL LOW (ref 8.9–10.3)
Chloride: 109 mmol/L (ref 98–111)
Creatinine, Ser: 0.57 mg/dL (ref 0.44–1.00)
GFR, Estimated: >60 mL/min (ref >60)
Glucose, Bld: 84 mg/dL (ref 70–99)
Potassium: 4 mmol/L (ref 3.5–5.1)
Sodium: 140 mmol/L (ref 135–145)

## 2021-02-09 LAB — CBC
HCT: 30.6 % — ABNORMAL LOW (ref 36.0–46.0)
Hemoglobin: 10.2 g/dL — ABNORMAL LOW (ref 12.0–15.0)
MCH: 29.5 pg (ref 26.0–34.0)
MCHC: 33.3 g/dL (ref 30.0–36.0)
MCV: 88.4 fL (ref 80.0–100.0)
Platelets: 191 10*3/uL (ref 150–400)
RBC: 3.46 MIL/uL — ABNORMAL LOW (ref 3.87–5.11)
RDW: 14.1 % (ref 11.5–15.5)
WBC: 11.4 10*3/uL — ABNORMAL HIGH (ref 4.0–10.5)
nRBC: 0 % (ref 0.0–0.2)

## 2021-02-09 LAB — GLUCOSE, CAPILLARY
Glucose-Capillary: 73 mg/dL (ref 70–99)
Glucose-Capillary: 76 mg/dL (ref 70–99)
Glucose-Capillary: 109 mg/dL — ABNORMAL HIGH (ref 70–99)
Glucose-Capillary: 68 mg/dL — ABNORMAL LOW (ref 70–99)
Glucose-Capillary: 96 mg/dL (ref 70–99)
Glucose-Capillary: 120 mg/dL — ABNORMAL HIGH (ref 70–99)
Glucose-Capillary: 118 mg/dL — ABNORMAL HIGH (ref 70–99)

## 2021-02-09 LAB — MAGNESIUM: Magnesium: 1.9 mg/dL (ref 1.7–2.4)

## 2021-02-09 MED ORDER — LEVOFLOXACIN 500 MG PO TABS
500.0000 mg | ORAL_TABLET | Freq: Every day | ORAL | Status: DC
  Administered 2021-02-10 – 2021-02-11 (×2): 500 mg via ORAL
  Filled 2021-02-09 (×2): qty 1

## 2021-02-09 MED ORDER — ACETAMINOPHEN 325 MG PO TABS
650.0000 mg | ORAL_TABLET | ORAL | Status: DC | PRN
  Administered 2021-02-10 – 2021-02-12 (×6): 650 mg via ORAL
  Filled 2021-02-09 (×6): qty 2

## 2021-02-09 MED ORDER — CLONAZEPAM 1 MG PO TABS
1.0000 mg | ORAL_TABLET | Freq: Two times a day (BID) | ORAL | Status: DC
  Administered 2021-02-09 – 2021-02-19 (×20): 1 mg via ORAL
  Filled 2021-02-09 (×20): qty 1

## 2021-02-09 MED ORDER — MAGNESIUM SULFATE 2 GM/50ML IV SOLN
2.0000 g | Freq: Once | INTRAVENOUS | Status: AC
  Administered 2021-02-09: 07:00:00 2 g via INTRAVENOUS
  Filled 2021-02-09: qty 50

## 2021-02-09 MED ORDER — POLYETHYLENE GLYCOL 3350 17 G PO PACK
17.0000 g | PACK | Freq: Every day | ORAL | Status: DC
  Administered 2021-02-15: 10:00:00 17 g via ORAL
  Filled 2021-02-09 (×6): qty 1

## 2021-02-09 NOTE — Progress Notes (Signed)
PT Cancellation Note  Patient Details Name: Carmen Daniels MRN: [id number] DOB: [birth year]   Cancelled Treatment:    Reason Eval/Treat Not Completed: Patient at procedure or test/unavailable (pt leaving for xray, will reattempt)   Takeshi Teasdale B Bianka Liberati 02/09/2021, 12:08 PM Bayard Males, PT Acute Rehabilitation Services Pager: [phone] Office: [phone]

## 2021-02-09 NOTE — Progress Notes (Signed)
Nutrition Consult Brief Note  Received consult for enteral nutrition initiation and management. Patient has been extubated.  Currently on a regular diet. TF not indicated. Please re-consult dietitian as needed.  Lucas Mallow, RD, LDN, CNSC Please refer to [facility] for contact information.

## 2021-02-09 NOTE — Progress Notes (Signed)
PROGRESS NOTE    Carmen Daniels  IWL:[id number] DOB: April 09, 1985 DOA: 02/07/2021 PCP: [name], MD    Chief Complaint  Patient presents with   Possible Drug Overdose    Brief Narrative:  35 yo F PMH Bipolar disorder, OCD, Depression with SI, fibromyalgia, CPS, BPD, migraines,  who presented to ED 12/17 following suspected intentional overdose. Pt was reportedly on phone with a friend when she took " 300mg " of undisclosed substance and that she "no longer wanted to live and wanted to end her life." EMS was called and found multiple empty bottles next to patient including ambien, clonazepam, sumatriptan -- unknown if other substances ingested concomitantly. EMS reported that on their arrival pt denied SI. Narcan was given by EMS without change in status. In ED, pt noted to briefly arouse only to sternal rub. Poison control was contacted and recommended close observation.    Pt was admitted to [facility] but on follow up exam noted to be obtunded with agonal respirations and no response to deep sternal rub or jaw thrust. Nasal trumpet placed and PCCM called for emergent intubation and ICU admission.  Pt started on epinephrine and intubated, pt extubated on 02/08/21 and transferred to [facility] on 02/09/21.  Assessment & Plan:   Principal Problem:   Intentional overdose ([place])   Acute metabolic encephalopathy due to intentional overdose from suicide attempt Patient was found to have empty bottles on Ambien, clonazepam, sumatriptan. Patient is alert and oriented this morning. Patient currently off all her psychiatric medications. Pending psychiatry consult.    Acute respiratory failure with hypercarbia due to hypoventilation in the setting of toxic metabolic encephalopathy. patient was intubated and extubated on 02/08/2021. Patient was started on broad-spectrum IV antibiotics, transition to oral antibiotics today.   Drug-induced hypotension and bradycardia Weaned off epinephrine  gtt. Resolved.    History of bipolar disorder, schizoaffective disorder Suicidal ideation. Psychiatric evaluation as patient is medically stable.   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    Hypokalemia, hypomagnesemia  Replaced.   Normocytic anemia:  - hemoglobin around 10.  DVT prophylaxis: Heparin Code Status: Full code.  Family Communication: none at bedside.  Disposition:   Status is: Inpatient  Remains inpatient appropriate because: unsafe d/c plan.        Consultants:  Psychiatry.   Procedures:R IJ CVC placement.  Extubation on 02/08/21  Antimicrobials:  Antibiotics Given (last 72 hours)     Date/Time Action Medication Dose Rate   02/07/21 1046 New Bag/Given   vancomycin (VANCOREADY) IVPB 1500 mg/300 mL 1,500 mg 150 mL/hr   02/07/21 1302 New Bag/Given   aztreonam (AZACTAM) 2 g in sodium chloride 0.9 % 100 mL IVPB 2 g 200 mL/hr   02/07/21 2235 New Bag/Given   aztreonam (AZACTAM) 1 g in sodium chloride 0.9 % 100 mL IVPB 1 g 200 mL/hr   02/07/21 2333 New Bag/Given   vancomycin (VANCOREADY) IVPB 1250 mg/250 mL 1,250 mg 166.7 mL/hr   02/08/21 0514 New Bag/Given   aztreonam (AZACTAM) 1 g in sodium chloride 0.9 % 100 mL IVPB 1 g 200 mL/hr   02/08/21 1100 New Bag/Given   vancomycin (VANCOREADY) IVPB 1250 mg/250 mL 1,250 mg 166.7 mL/hr   02/08/21 1304 New Bag/Given   aztreonam (AZACTAM) 1 g in sodium chloride 0.9 % 100 mL IVPB 1 g 200 mL/hr   02/08/21 2153 New Bag/Given   aztreonam (AZACTAM) 1 g in sodium chloride 0.9 % 100 mL  IVPB 1 g 200 mL/hr   02/08/21 2314 New Bag/Given   vancomycin (VANCOREADY) IVPB 1250 mg/250 mL 1,250 mg 166.7 mL/hr   02/09/21 0601 New Bag/Given   aztreonam (AZACTAM) 1 g in sodium chloride 0.9 % 100 mL IVPB 1 g 200 mL/hr   02/09/21 0908 New Bag/Given   vancomycin (VANCOREADY) IVPB 1250 mg/250 mL 1,250 mg 166.7 mL/hr   02/09/21 1413 New Bag/Given    aztreonam (AZACTAM) 1 g in sodium chloride 0.9 % 100 mL IVPB 1 g 200 mL/hr        Subjective: No chest pain or sob. Leg pain.   Objective: Vitals:   02/09/21 0900 02/09/21 1000 02/09/21 1100 02/09/21 1130  BP:  110/63    Pulse: 84 (!) 58 (!) 55   Resp: 14 10 10    Temp:    98.1 F (36.7 C)  TempSrc:    Oral  SpO2: 99% 98% 99%   Weight:      Height:        Intake/Output Summary (Last 24 hours) at 02/09/2021 1133 Last data filed at 02/09/2021 1109 Gross per 24 hour  Intake 1270.49 ml  Output 4350 ml  Net -3079.51 ml   Filed Weights   02/07/21 0156 02/08/21 0500 02/09/21 0500  Weight: 79.4 kg 70.7 kg 75.1 kg    Examination:  General exam: Appears calm and comfortable  Respiratory system: Clear to auscultation. Respiratory effort normal. Cardiovascular system: S1 & S2 heard, RRR. No JVD,  No pedal edema. Gastrointestinal system: Abdomen is nondistended, soft and nontender.  Normal bowel sounds heard. Central nervous system: Alert and oriented. No focal neurological deficits. Extremities: no pedal edema.  Skin: No rashes, lesions or ulcers Psychiatry: flat affect.     Data Reviewed: I have personally reviewed following labs and imaging studies  CBC: Recent Labs  Lab 02/07/21 0200 02/07/21 0544 02/07/21 0918 02/07/21 1052 02/08/21 0334 02/09/21 0355  WBC 6.5  --  8.2  --  11.6* 11.4*  NEUTROABS 3.9  --   --   --   --   --   HGB 15.3* 12.9 11.1* 10.5* 11.0* 10.2*  HCT 45.7 38.0 32.5* 31.0* 32.3* 30.6*  MCV 85.9  --  87.8  --  85.7 88.4  PLT 323  --  206  --  325 782    Basic Metabolic Panel: Recent Labs  Lab 02/07/21 0200 02/07/21 0544 02/07/21 0918 02/07/21 1052 02/07/21 1849 02/08/21 0334 02/08/21 1309 02/08/21 1554 02/09/21 0355  NA 139   < >  --  143 139 141 143  --  140  K 2.9*   < >  --  3.3* 2.3* 3.5 4.4  --  4.0  CL 104  --   --   --  111 114* 113*  --  109  CO2 25  --   --   --  18* 24 26  --  27  GLUCOSE 116*  --   --   --  280*  102* 82  --  84  BUN <5*  --   --   --  7 7 7   --  <5*  CREATININE 0.73  --  0.62  --  0.80 0.67 0.54  --  0.57  CALCIUM 9.8  --   --   --  8.5* 8.4* 7.9*  --  8.3*  MG  --    < > 1.6*  --  1.6* 2.2 2.0 1.9 1.9  PHOS  --   --  1.7*  --  1.1* <1.0* 3.4 2.7  --    < > = values in this interval not displayed.    GFR: Estimated Creatinine Clearance: 108.7 mL/min (by C-G formula based on SCr of 0.57 mg/dL).  Liver Function Tests: Recent Labs  Lab 02/07/21 0200  AST 25  ALT 19  ALKPHOS 58  BILITOT 1.1  PROT 7.0  ALBUMIN 4.1    CBG: Recent Labs  Lab 02/08/21 2320 02/09/21 0343 02/09/21 0707 02/09/21 1101 02/09/21 1127  GLUCAP 121* 73 76 68* 109*     Recent Results (from the past 240 hour(s))  Resp Panel by RT-PCR (Flu A&B, Covid) Nasopharyngeal Swab     Status: None   Collection Time: 02/07/21  2:02 AM   Specimen: Nasopharyngeal Swab; Nasopharyngeal(NP) swabs in vial transport medium  Result Value Ref Range Status   SARS Coronavirus 2 by RT PCR NEGATIVE NEGATIVE Final    Comment: (NOTE) SARS-CoV-2 target nucleic acids are NOT DETECTED.  The SARS-CoV-2 RNA is generally detectable in upper respiratory specimens during the acute phase of infection. The lowest concentration of SARS-CoV-2 viral copies this assay can detect is 138 copies/mL. A negative result does not preclude SARS-Cov-2 infection and should not be used as the sole basis for treatment or other patient management decisions. A negative result may occur with  improper specimen collection/handling, submission of specimen other than nasopharyngeal swab, presence of viral mutation(s) within the areas targeted by this assay, and inadequate number of viral copies(<138 copies/mL). A negative result must be combined with clinical observations, patient history, and epidemiological information. The expected result is Negative.  Fact Sheet for Patients:  EntrepreneurPulse.com.au  Fact Sheet for  Healthcare Providers:  IncredibleEmployment.be  This test is no t yet approved or cleared by the [place] FDA and  has been authorized for detection and/or diagnosis of SARS-CoV-2 by FDA under an Emergency Use Authorization (EUA). This EUA will remain  in effect (meaning this test can be used) for the duration of the COVID-19 declaration under Section 564(b)(1) of the Act, 21 U.S.C.section 360bbb-3(b)(1), unless the authorization is terminated  or revoked sooner.       Influenza A by PCR NEGATIVE NEGATIVE Final   Influenza B by PCR NEGATIVE NEGATIVE Final    Comment: (NOTE) The Xpert Xpress SARS-CoV-2/FLU/RSV plus assay is intended as an aid in the diagnosis of influenza from Nasopharyngeal swab specimens and should not be used as a sole basis for treatment. Nasal washings and aspirates are unacceptable for Xpert Xpress SARS-CoV-2/FLU/RSV testing.  Fact Sheet for Patients: EntrepreneurPulse.com.au  Fact Sheet for Healthcare Providers: IncredibleEmployment.be  This test is not yet approved or cleared by the [place] FDA and has been authorized for detection and/or diagnosis of SARS-CoV-2 by FDA under an Emergency Use Authorization (EUA). This EUA will remain in effect (meaning this test can be used) for the duration of the COVID-19 declaration under Section 564(b)(1) of the Act, 21 U.S.C. section 360bbb-3(b)(1), unless the authorization is terminated or revoked.  Performed at [facility], [place] [address] [place], [place] [address]   C Difficile Quick Screen w PCR reflex     Status: None   Collection Time: 02/07/21 10:44 AM   Specimen: STOOL  Result Value Ref Range Status   C Diff antigen NEGATIVE NEGATIVE Final   C Diff toxin NEGATIVE NEGATIVE Final   C Diff interpretation No C. difficile detected.  Final    Comment: Performed at [facility], [name]  [address], [place], [place] [address]  Culture,  blood (routine x 2)     Status: None (Preliminary result)   Collection Time: 02/07/21 11:00 AM   Specimen: BLOOD RIGHT ARM  Result Value Ref Range Status   Specimen Description BLOOD RIGHT ARM  Final   Special Requests   Final    BOTTLES DRAWN AEROBIC AND ANAEROBIC Blood Culture adequate volume   Culture   Final    NO GROWTH 2 DAYS Performed at [facility], [place] [address] [place], [place] [address]    Report Status PENDING  Incomplete  Culture, blood (routine x 2)     Status: None (Preliminary result)   Collection Time: 02/07/21 11:20 AM   Specimen: BLOOD  Result Value Ref Range Status   Specimen Description BLOOD SITE NOT SPECIFIED  Final   Special Requests   Final    BOTTLES DRAWN AEROBIC AND ANAEROBIC Blood Culture results may not be optimal due to an excessive volume of blood received in culture bottles   Culture   Final    NO GROWTH 2 DAYS Performed at [facility], [place] [address] [place], [place] [address]    Report Status PENDING  Incomplete  MRSA Next Gen by PCR, Nasal     Status: Abnormal   Collection Time: 02/07/21  4:51 PM   Specimen: Nasal Mucosa; Nasal Swab  Result Value Ref Range Status   MRSA by PCR Next Gen DETECTED (A) NOT DETECTED Final    Comment: RESULT CALLED TO, READ BACK BY AND VERIFIED WITH: RN A.WALTON ON [id number] AT 1945 BY E.PARRISH (NOTE) The GeneXpert MRSA Assay (FDA approved for NASAL specimens only), is one component of a comprehensive MRSA colonization surveillance program. It is not intended to diagnose MRSA infection nor to guide or monitor treatment for MRSA infections. Test performance is not FDA approved in patients less than 66 years old. Performed at [facility], [place] [address], [place],  [address]          Radiology Studies: DG CHEST PORT 1 VIEW  Result Date: 02/07/2021 CLINICAL DATA:  Encounter for central line placement. EXAM: PORTABLE CHEST 1 VIEW COMPARISON:  Chest radiograph 02/07/2021  FINDINGS: Stable endotracheal tube and nasogastric tube. Interval placement of a right central venous catheter with tip projecting at the distal SVC. Persistent right basilar pulmonary opacity. Left lung is clear. No pneumothorax or large pleural effusion. IMPRESSION: Interval placement of a right central venous catheter with tip projecting at the distal SVC. Electronically Signed   By: Audie Pinto M.D.   On: 02/07/2021 18:52   DG CHEST PORT 1 VIEW  Result Date: 02/07/2021 CLINICAL DATA:  Check central line placement EXAM: PORTABLE CHEST 1 VIEW COMPARISON:  02/07/2021 FINDINGS: Previously seen left subclavian central line is again identified extending into the left jugular vein. Endotracheal tube and gastric catheter are again seen and stable. Cardiac shadow is within normal limits. Lungs are well aerated bilaterally. Some improved aeration in the right base is seen. No new focal abnormality is noted. IMPRESSION: Malposition left subclavian central line similar to that noted on the prior study. Improved aeration in the right base. Electronically Signed   By: Inez Catalina M.D.   On: 02/07/2021 17:47   DG CHEST PORT 1 VIEW  Result Date: 02/07/2021 CLINICAL DATA:  Potential overdose. EXAM: PORTABLE CHEST 1 VIEW COMPARISON:  Earlier same day FINDINGS: Interval placement of left subclavian vein approach central venous catheter, coursing superiorly, tip excluded from  view. Otherwise, stable positioning of remaining support apparatus. No pneumothorax. Slight worsening of right basilar heterogeneous opacities. No pleural effusion or pneumothorax. No evidence of edema. IMPRESSION: 1. Malpositioned left subclavian central venous catheter, coursing superiorly, tip excluded from view. Repositioning is advised. 2. Otherwise, stable positioning of support apparatus. No pneumothorax. 3. Slight worsening of right basilar opacities, atelectasis versus infiltrate/aspiration. Electronically Signed   By: [name]  M.D.   On: 02/07/2021 12:59        Scheduled Meds:  Chlorhexidine Gluconate Cloth  6 each Topical Daily   docusate sodium  100 mg Oral BID   heparin  5,000 Units Subcutaneous Q8H   insulin aspart  0-15 Units Subcutaneous Q4H   mouth rinse  15 mL Mouth Rinse BID   mupirocin ointment  1 application Nasal BID   nicotine  14 mg Transdermal Q0600   polyethylene glycol  17 g Oral Daily   sodium chloride flush  10-40 mL Intracatheter Q12H   Continuous Infusions:  sodium chloride Stopped (02/08/21 0737)   aztreonam Stopped (02/09/21 0631)   vancomycin Stopped (02/09/21 1044)     LOS: 2 days       Hosie Poisson, MD [facility]   To contact the attending provider between 7A-7P or the covering provider during after hours 7P-7A, please log into the web site [email or web address] and access using universal [place] password for that web site. If you do not have the password, please call the hospital operator.  02/09/2021, 11:33 AM
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
## 2021-02-09 NOTE — Social Work (Signed)
EDCSW received call requesting assistance with IVC paperwork. CSW went to floor to meet with provider to have provider fill out MD part of the paperwork.  CSW waited for several minutes but no provider was available. CSW's presence was required in ED for ED patients. CSW left number with floor RN. CSW will be available until 11 pm if needed. Vergie Living MSW LCSWA Transitions of Care   Clinical Social Worker  [facility] Emergency Departments  [phone]

## 2021-02-09 NOTE — Progress Notes (Signed)
[facility] ADULT ICU REPLACEMENT PROTOCOL   The patient does apply for the [facility] Adult ICU Electrolyte Replacment Protocol based on the criteria listed below:   1.Exclusion criteria: TCTS patients, ECMO patients, and Dialysis patients 2. Is GFR >/= 30 ml/min? Yes.    Patient's GFR today is >60 3. Is SCr </= 2? Yes.   Patient's SCr is 0.57 mg/dL 4. Did SCr increase >/= 0.5 in 24 hours? No. 5.Pt's weight >40kg  Yes.   6. Abnormal electrolyte(s): Mg 1.9  7. Electrolytes replaced per protocol   Christeen Douglas 02/09/2021 5:31 AM

## 2021-02-09 NOTE — Consult Note (Signed)
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 placement. EXAM: PORTABLE CHEST 1 VIEW COMPARISON:  Chest radiograph 02/07/2021 FINDINGS: Stable endotracheal tube and nasogastric tube.  Interval placement of a right central venous catheter with tip projecting at the distal SVC. Persistent right basilar pulmonary opacity. Left lung is clear. No pneumothorax or large pleural effusion. IMPRESSION: Interval placement of a right central venous catheter with tip projecting at the distal SVC. Electronically Signed   By: Audie Pinto M.D.   On: 02/07/2021 18:52   DG CHEST PORT 1 VIEW  Result Date: 02/07/2021 CLINICAL DATA:  Check central line placement EXAM: PORTABLE CHEST 1 VIEW COMPARISON:  02/07/2021 FINDINGS: Previously seen left subclavian central line is again identified extending into the  left jugular vein. Endotracheal tube and gastric catheter are again seen and stable. Cardiac shadow is within normal limits. Lungs are well aerated bilaterally. Some improved aeration in the right base is seen. No new focal abnormality is noted. IMPRESSION: Malposition left subclavian central line similar to that noted on the prior study. Improved aeration in the right base. Electronically Signed   By: Inez Catalina M.D.   On: 02/07/2021 17:47     Blood pressure 128/69, pulse 63, temperature 97.6 F (36.4 C), temperature source Axillary, resp. rate 12, height 5\' 11"  (1.803 m), weight 75.1 kg, SpO2 99 %. Psychiatric Specialty Exam: Physical Exam Vitals and nursing note reviewed.  Constitutional:      General: She is not in acute distress.    Appearance: Normal appearance. She is not ill-appearing or toxic-appearing.  HENT:     Head: Normocephalic and atraumatic.  Pulmonary:     Effort: Pulmonary effort is normal.  Musculoskeletal:        General: Normal range of motion.  Neurological:     Mental Status: She is alert and oriented to person, place, and time.    Review of Systems  Respiratory:  Negative for cough and shortness of breath.   Cardiovascular:  Negative for chest pain.  Gastrointestinal:  Positive for nausea. Negative for abdominal pain, constipation, diarrhea and vomiting.  Neurological:  Positive for dizziness, weakness (chronic) and headaches.  Psychiatric/Behavioral:  Negative for agitation, hallucinations, self-injury and suicidal ideas.    Blood pressure 128/69, pulse 63, temperature 97.6 F (36.4 C), temperature source Axillary, resp. rate 12, height 5\' 11"  (1.803 m), weight 75.1 kg, SpO2 99 %.Body mass index is 23.09 kg/m.  General Appearance: Casual  Eye Contact:  Fair  Speech:  Clear and Coherent and Slow  Volume:  Decreased  Mood:  Anxious and Depressed  Affect:  Congruent and Depressed  Thought Process:  Coherent  Orientation:  Full (Time, Place, and Person)   Thought Content:  Logical  Suicidal Thoughts:   No at present time  Homicidal Thoughts:  No  Memory:  Immediate;   Poor Recent;   Poor Remote;   Poor  Judgement:  Impaired  Insight:  Lacking  Psychomotor Activity:  Decreased  Concentration:  Concentration: Poor and Attention Span: Poor  Recall:  Poor  Fund of Knowledge:  Fair  Language:  Good  Akathisia:  Negative  Handed:  Right  AIMS (if indicated):     Assets:  Resilience  ADL's:  Intact  Cognition:  Impaired,  Mild  Sleep:   poor     Briant Cedar 02/09/2021, 4:23 PM

## 2021-02-10 DIAGNOSIS — F4323 Adjustment disorder with mixed anxiety and depressed mood: Secondary | ICD-10-CM

## 2021-02-10 DIAGNOSIS — F3130 Bipolar disorder, current episode depressed, mild or moderate severity, unspecified: Secondary | ICD-10-CM

## 2021-02-10 LAB — GLUCOSE, CAPILLARY
Glucose-Capillary: 107 mg/dL — ABNORMAL HIGH (ref 70–99)
Glucose-Capillary: 106 mg/dL — ABNORMAL HIGH (ref 70–99)
Glucose-Capillary: 77 mg/dL (ref 70–99)
Glucose-Capillary: 93 mg/dL (ref 70–99)
Glucose-Capillary: 82 mg/dL (ref 70–99)
Glucose-Capillary: 87 mg/dL (ref 70–99)

## 2021-02-10 LAB — BASIC METABOLIC PANEL
Anion gap: 6 (ref 5–15)
BUN: <5 mg/dL — ABNORMAL LOW (ref 6–20)
CO2: 27 mmol/L (ref 22–32)
Calcium: 9 mg/dL (ref 8.9–10.3)
Chloride: 107 mmol/L (ref 98–111)
Creatinine, Ser: 0.61 mg/dL (ref 0.44–1.00)
GFR, Estimated: >60 mL/min (ref >60)
Glucose, Bld: 97 mg/dL (ref 70–99)
Potassium: 3.8 mmol/L (ref 3.5–5.1)
Sodium: 140 mmol/L (ref 135–145)

## 2021-02-10 LAB — MAGNESIUM: Magnesium: 1.8 mg/dL (ref 1.7–2.4)

## 2021-02-10 MED ORDER — SUMATRIPTAN SUCCINATE 100 MG PO TABS
100.0000 mg | ORAL_TABLET | ORAL | Status: AC | PRN
  Administered 2021-02-10 – 2021-02-15 (×2): 100 mg via ORAL
  Filled 2021-02-10 (×4): qty 1

## 2021-02-10 MED ORDER — BUPRENORPHINE HCL 2 MG SL SUBL
4.0000 mg | SUBLINGUAL_TABLET | Freq: Every day | SUBLINGUAL | Status: DC
  Administered 2021-02-10: 15:00:00 4 mg via SUBLINGUAL
  Filled 2021-02-10: qty 2

## 2021-02-10 NOTE — TOC Progression Note (Addendum)
Transition of Care [facility]) - Progression Note    Patient Details  Name: JANAISA BIRKLAND MRN: [id number] Date of Birth: [birth year]  Transition of Care [facility]) CM/SW [place], [place] Phone Number: 02/10/2021, 11:16 AM  Clinical Narrative:     CSW is informed pt is medically stable and needing inpatient psych patient. CSW sent secure chat to [place] staff and left voicemail on [facility] dispo phone; awaiting response.   IVC completed and faxed to [place]. Magistrate sent CSW signed IVC orders. LE arrived on unit to sign/"serve" papers.   [facility] is reviewing pt and requesting IVC documentation.   1445: CSW faxed copy of IVC paperwork.    Expected Discharge Plan: [facility]    Expected Discharge Plan and Services Expected Discharge Plan: [place] arrangements for the past 2 months: Single Family Home                                       Social Determinants of Health (SDOH) Interventions    Readmission Risk Interventions No flowsheet data found.

## 2021-02-10 NOTE — Consult Note (Signed)
Reason for Consult: Suicide Attempt Overdose, Bipolar, off medications Referring Physician: Hosie Poisson, MD     Assessment/Plan: Carmen Daniels is a 35 y.o. female admitted medically for 02/07/2021  1:45 AM for Suicide Attempt Overdose.  She carries the psychiatric diagnoses of Bipolar Disorder, Schizophrenia, Borderline Personality Disorder, Anxiety Disorder, and OCD and has a past medical history of fibromyalgia, complex regional pain syndrome and weakness after an episode of rhabdomyolysis in early 2022.  Psychiatry was consulted for Suicide Attempt Overdose, Bipolar, off medications.   She meets criteria for Inpatient Psychiatric Hospitalization based on her Suicide Attempt via Overdose.  Outpatient psychotropic medications include none since March and historically has had side effects to these medications requiring multiple trials.  She was not compliant with medications prior to admission as evidenced by patient and mother reporting stopping medication in March.     She is improving as she is remembering more surrounding the events of her overdose.  She has been medically cleared and so we have recommended for social work to begin faxing her information out to inpatient facilities.  We did discuss with her today about residential treatment and she said that she would be interested in pursuing this further when she gets admitted to an inpatient facility.  We will not make any medication recommendations at this time as she is doing stable on her reduced dose of Klonopin.  When she gets to the inpatient unit they will begin psychiatric medications.   Recommendations: -Send patient information out to inpatient facilities -Continue Klonopin 1 mg BID   Continue rest of care per Primary Team These recommendations have been discussed with the Primary Team Psychiatry will continue to follow the patient until she is excepted to an inpatient facility.    Carmen Daniels is an 35 y.o. female.  HPI:  35 yo F PMH Bipolar disorder, OCD, Depression with SI, fibromyalgia, CPS, BPD, migraines,  who presented to ED 12/17 following suspected intentional overdose. Pt was reportedly on phone with a friend when she took " 300mg " of undisclosed substance and that she "no longer wanted to live and wanted to end her life." EMS was called and found multiple empty bottles next to patient including ambien, clonazepam, sumatriptan -- unknown if other substances ingested concomitantly. EMS reported that on their arrival pt denied SI. Narcan was given by EMS without change in status. In ED, pt noted to briefly arouse only to sternal rub. Poison control was contacted and recommended close observation.    On interview today patient states that she is doing okay.  She states that she still does have significant pain in her legs but that the primary team will be restarting her pain medication and so this should improve.  She reports that her sleep was fair last night.  She reports no SI, HI, or AVH.  When asked if any of her memory was beginning to return to her she stated that when she had overdoses in the past her memory will be foggy but will clear up as time goes on.  She states she is able to remember that she had gotten into a very significant argument with her ex-husband ("one of the worst ones we have ever had") and that she also got into an argument with another guy that she had been seeing.  She states that she just became so angry that she impulsively took her meds.  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  Discussed with her about potentially going to residential treatment when she is psychiatrically stabilized.  She states that she  would be interested in this and wanted more information.  Discussed with her that this is something that the social worker of what ever inpatient unit she gets accepted to would be able to better coordinate with her insurance.  She was understanding of this and agreeable to this.  Discussed with her that we had contacted the hospitalist so that social work could begin to refer her to inpatient facilities and that hopefully we would get a response soon.  She reports that she still having a headache due to not having her migraine medication.  She reports that she still has some nausea.  She reports that she is still having some dizziness/weakness.  She reports no other concerns at present.  Past Medical History:  Diagnosis Date   Allergic rhinitis    Anxiety    Asthma    Bipolar disorder ([place])    Borderline personality disorder ([place])    Bupropion overdose    Chronic pain syndrome    Depression    Fibromyalgia    generalized   Hallucinations    Migraines    Nasal polyps    OCD (obsessive compulsive disorder)    Ovarian cyst    left   Pneumonia    2021   PONV (postoperative nausea and vomiting)    Schizophrenia ([place])    schizoaffective    Past Surgical History:  Procedure Laterality Date   ABDOMINAL HYSTERECTOMY     LAPAROSCOPIC TUBAL LIGATION Bilateral 06/13/2019   Procedure: LAPAROSCOPIC TUBAL LIGATION;  Surgeon: Chancy Milroy, MD;  Location: [facility];  Service: Gynecology;  Laterality: Bilateral;  FILSHIE CLIPS   NASAL SINUS SURGERY     TUBAL LIGATION     tubes in ears     VAGINAL HYSTERECTOMY Bilateral 01/15/2020   Procedure: HYSTERECTOMY VAGINAL;  Surgeon: Chancy Milroy, MD;  Location: [facility];  Service: Gynecology;  Laterality: Bilateral;   WISDOM TOOTH EXTRACTION      Family History  Problem Relation Age of Onset   Healthy Mother    Healthy Father    Arthritis Maternal Grandmother    Arthritis Maternal Grandfather    Depression Paternal Grandmother     Hyperlipidemia Other    Stroke Other    Hypertension Other    Cancer Other    COPD Other    Asthma Other     Social History:  reports that she has been smoking cigarettes. She has a 7.50 pack-year smoking history. She has never used smokeless tobacco. She reports current drug use. She reports that she does not drink alcohol.  Allergies:  Allergies  Allergen Reactions   Keflex [Cephalexin] Anaphylaxis    Has tolerated amoxicillin 2019/2020    Medications: I have reviewed the patient's current medications. Prior to Admission:  Medications Prior to Admission  Medication Sig Dispense Refill Last Dose   beclomethasone (QVAR) 40 MCG/ACT inhaler Inhale 1 puff into the lungs 2 (two) times daily as needed (asthma).    unknown   botulinum toxin Type A (BOTOX) 100 units SOLR injection Inject 200 Units into the muscle every 3 (three) months. Inject into head and neck muscles 2 each 3 over 2 months  buprenorphine (SUBUTEX) 2 MG SUBL SL tablet Place 4 mg under the tongue in the morning, at noon, in the evening, and at bedtime.   02/06/2021   clonazePAM (KLONOPIN) 1 MG tablet Take 1 mg by mouth 4 (four) times daily.   Past Week   Fremanezumab-vfrm (AJOVY) 225 MG/1.5ML SOSY INJECT 225 MG INTO THE SKIN EVERY 30 (THIRTY) DAYS. 1.5 mL 11 over 1 month   gabapentin (NEURONTIN) 400 MG capsule Take 400 mg by mouth 4 (four) times daily.   02/06/2021   Multiple Vitamins-Calcium (ONE-A-DAY WOMENS FORMULA PO) Take 1 tablet by mouth daily.   Past Week   SUMAtriptan (IMITREX) 100 MG tablet TAKE 1 TABLET BY MOUTH EVERY 2 HOURS AS NEEDED FOR MIGRAINE. MAY DOSE 2 PER DAY OR 3 TIMES A WEEK. 12 TABLETS PER 30 DAYS, NO EARLY REFILL 12 tablet 2 unknown   zolpidem (AMBIEN) 10 MG tablet Take 10 mg by mouth at bedtime.   Past Week   OXcarbazepine (TRILEPTAL) 150 MG tablet Take 2 tablets (300 mg total) by mouth 2 (two) times daily. (Patient not taking: Reported on 02/08/2021) 120 tablet 11 Completed Course    Scheduled:  buprenorphine  4 mg Sublingual Daily   Chlorhexidine Gluconate Cloth  6 each Topical Daily   clonazePAM  1 mg Oral BID   docusate sodium  100 mg Oral BID   heparin  5,000 Units Subcutaneous Q8H   insulin aspart  0-15 Units Subcutaneous Q4H   levofloxacin  500 mg Oral Daily   mouth rinse  15 mL Mouth Rinse BID   mupirocin ointment  1 application Nasal BID   nicotine  14 mg Transdermal Q0600   polyethylene glycol  17 g Oral Daily   sodium chloride flush  10-40 mL Intracatheter Q12H   Continuous:  sodium chloride Stopped (02/08/21 0737)   RSW:[id number], docusate sodium, polyethylene glycol, sodium chloride flush, SUMAtriptan Anti-infectives (From admission, onward)    Start     Dose/Rate Route Frequency Ordered Stop   02/10/21 1000  levofloxacin (LEVAQUIN) tablet 500 mg        500 mg Oral Daily 02/09/21 1503     02/07/21 2300  vancomycin (VANCOREADY) IVPB 1250 mg/250 mL  Status:  Discontinued        1,250 mg 166.7 mL/hr over 90 Minutes Intravenous Every 12 hours 02/07/21 0931 02/09/21 1503   02/07/21 1800  aztreonam (AZACTAM) 1 g in sodium chloride 0.9 % 100 mL IVPB  Status:  Discontinued        1 g 200 mL/hr over 30 Minutes Intravenous Every 8 hours 02/07/21 0934 02/09/21 1503   02/07/21 0945  vancomycin (VANCOREADY) IVPB 1500 mg/300 mL        1,500 mg 150 mL/hr over 120 Minutes Intravenous  Once 02/07/21 0931 02/07/21 1252   02/07/21 0945  aztreonam (AZACTAM) 2 g in sodium chloride 0.9 % 100 mL IVPB        2 g 200 mL/hr over 30 Minutes Intravenous  Once 02/07/21 0934 02/07/21 1341       Results for orders placed or performed during the hospital encounter of 02/07/21 (from the past 48 hour(s))  Glucose, capillary     Status: None   Collection Time: 02/08/21  3:03 PM  Result Value Ref Range   Glucose-Capillary 97 70 - 99 mg/dL    Comment: Glucose reference range applies only to samples taken after fasting for at least 8 hours.  Magnesium     Status: None  Collection Time: 02/08/21  3:54 PM  Result Value Ref Range   Magnesium 1.9 1.7 - 2.4 mg/dL    Comment: Performed at [facility], [place] [address], [place], [place] [address]  Phosphorus     Status: None   Collection Time: 02/08/21  3:54 PM  Result Value Ref Range   Phosphorus 2.7 2.5 - 4.6 mg/dL    Comment: Performed at [facility] [address], [place], [place] [address]  Glucose, capillary     Status: None   Collection Time: 02/08/21  7:08 PM  Result Value Ref Range   Glucose-Capillary 86 70 - 99 mg/dL    Comment: Glucose reference range applies only to samples taken after fasting for at least 8 hours.  Glucose, capillary     Status: Abnormal   Collection Time: 02/08/21 11:20 PM  Result Value Ref Range   Glucose-Capillary 121 (H) 70 - 99 mg/dL    Comment: Glucose reference range applies only to samples taken after fasting for at least 8 hours.  Glucose, capillary     Status: None   Collection Time: 02/09/21  3:43 AM  Result Value Ref Range   Glucose-Capillary 73 70 - 99 mg/dL    Comment: Glucose reference range applies only to samples taken after fasting for at least 8 hours.  Basic metabolic panel     Status: Abnormal   Collection Time: 02/09/21  3:55 AM  Result Value Ref Range   Sodium 140 135 - 145 mmol/L   Potassium 4.0 3.5 - 5.1 mmol/L   Chloride 109 98 - 111 mmol/L   CO2 27 22 - 32 mmol/L   Glucose, Bld 84 70 - 99 mg/dL    Comment: Glucose reference range applies only to samples taken after fasting for at least 8 hours.   BUN <5 (L) 6 - 20 mg/dL   Creatinine, Ser 0.57 0.44 - 1.00 mg/dL   Calcium 8.3 (L) 8.9 - 10.3 mg/dL   GFR, Estimated >60 >60 mL/min    Comment: (NOTE) Calculated using the CKD-EPI Creatinine Equation (2021)    Anion gap 4 (L) 5 - 15    Comment: Performed at [facility] [address], [place], [place] [address]  CBC     Status: Abnormal   Collection Time: 02/09/21  3:55 AM  Result Value Ref Range   WBC 11.4 (H) 4.0 -  10.5 K/uL   RBC 3.46 (L) 3.87 - 5.11 MIL/uL   Hemoglobin 10.2 (L) 12.0 - 15.0 g/dL   HCT 30.6 (L) 36.0 - 46.0 %   MCV 88.4 80.0 - 100.0 fL   MCH 29.5 26.0 - 34.0 pg   MCHC 33.3 30.0 - 36.0 g/dL   RDW 14.1 11.5 - 15.5 %   Platelets 191 150 - 400 K/uL   nRBC 0.0 0.0 - 0.2 %    Comment: Performed at [facility], [place] [address], [place], [place] [address]  Magnesium     Status: None   Collection Time: 02/09/21  3:55 AM  Result Value Ref Range   Magnesium 1.9 1.7 - 2.4 mg/dL    Comment: Performed at [facility] [address] [place],  [address]  Glucose, capillary     Status: None   Collection Time: 02/09/21  7:07 AM  Result Value Ref Range   Glucose-Capillary 76 70 - 99 mg/dL    Comment: Glucose reference range applies only to samples taken after fasting for at  least 8 hours.  Glucose, capillary     Status: Abnormal   Collection Time: 02/09/21 11:01 AM  Result Value Ref Range   Glucose-Capillary 68 (L) 70 - 99 mg/dL    Comment: Glucose reference range applies only to samples taken after fasting for at least 8 hours.  Glucose, capillary     Status: Abnormal   Collection Time: 02/09/21 11:27 AM  Result Value Ref Range   Glucose-Capillary 109 (H) 70 - 99 mg/dL    Comment: Glucose reference range applies only to samples taken after fasting for at least 8 hours.  Glucose, capillary     Status: None   Collection Time: 02/09/21  3:03 PM  Result Value Ref Range   Glucose-Capillary 96 70 - 99 mg/dL    Comment: Glucose reference range applies only to samples taken after fasting for at least 8 hours.  Glucose, capillary     Status: Abnormal   Collection Time: 02/09/21  7:10 PM  Result Value Ref Range   Glucose-Capillary 120 (H) 70 - 99 mg/dL    Comment: Glucose reference range applies only to samples taken after fasting for at least 8 hours.  Glucose, capillary     Status: Abnormal   Collection Time: 02/09/21 11:08 PM  Result Value Ref Range   Glucose-Capillary  118 (H) 70 - 99 mg/dL    Comment: Glucose reference range applies only to samples taken after fasting for at least 8 hours.  Glucose, capillary     Status: Abnormal   Collection Time: 02/10/21  3:16 AM  Result Value Ref Range   Glucose-Capillary 107 (H) 70 - 99 mg/dL    Comment: Glucose reference range applies only to samples taken after fasting for at least 8 hours.  Glucose, capillary     Status: Abnormal   Collection Time: 02/10/21  7:09 AM  Result Value Ref Range   Glucose-Capillary 106 (H) 70 - 99 mg/dL    Comment: Glucose reference range applies only to samples taken after fasting for at least 8 hours.  Glucose, capillary     Status: None   Collection Time: 02/10/21 10:58 AM  Result Value Ref Range   Glucose-Capillary 77 70 - 99 mg/dL    Comment: Glucose reference range applies only to samples taken after fasting for at least 8 hours.    DG Chest 2 View  Result Date: 02/09/2021 CLINICAL DATA:  35 year old female with shortness of breath EXAM: CHEST - 2 VIEW COMPARISON:  02/07/2021 FINDINGS: Cardiomediastinal silhouette unchanged in size and contour. No evidence of central vascular congestion. No interlobular septal thickening. Right IJ central venous catheter, with the tip appearing to terminate superior vena cava. No pneumothorax. Improved aeration compared 2 recent chest x-rays, with node definite confluent airspace disease at the right lung base. Vertical linear opacity overlying the right heart border is favored to represent atelectasis given the absence on the lateral view. IMPRESSION: Improved aeration of the lungs, with improved appearance of the right lung base. Presumed atelectasis persists. Unchanged right IJ central venous catheter Electronically Signed   By: Corrie Mckusick D.O.   On: 02/09/2021 13:05     Blood pressure 104/63, pulse (!) 55, temperature 98.4 F (36.9 C), temperature source Oral, resp. rate 19, height 5\' 11"  (1.803 m), weight 69.8 kg, SpO2 99 %. Psychiatric  Specialty Exam: Physical Exam Vitals and nursing note reviewed.  Constitutional:      General: She is not in acute distress.    Appearance: Normal appearance. She is  not ill-appearing or toxic-appearing.  HENT:     Head: Normocephalic and atraumatic.  Pulmonary:     Effort: Pulmonary effort is normal.  Musculoskeletal:        General: Normal range of motion.  Neurological:     Mental Status: She is alert and oriented to person, place, and time.    Review of Systems  Respiratory:  Negative for shortness of breath.   Cardiovascular:  Negative for chest pain.  Gastrointestinal:  Positive for nausea. Negative for abdominal pain, constipation, diarrhea and vomiting.  Neurological:  Positive for dizziness, weakness and headaches.  Psychiatric/Behavioral:  Positive for dysphoric mood. Negative for agitation, behavioral problems, hallucinations, sleep disturbance and suicidal ideas. The patient is not nervous/anxious.    Blood pressure 104/63, pulse (!) 55, temperature 98.4 F (36.9 C), temperature source Oral, resp. rate 19, height 5\' 11"  (1.803 m), weight 69.8 kg, SpO2 99 %.Body mass index is 21.46 kg/m.  General Appearance: Casual multiple tattoos on hands/arms and neck  Eye Contact:  Good  Speech:  Clear and Coherent and Normal Rate  Volume:  Normal  Mood:  Depressed  Affect:  Congruent and Depressed  Thought Process:  Coherent and Goal Directed  Orientation:  Full (Time, Place, and Person)  Thought Content:  Logical  Suicidal Thoughts:  No  Homicidal Thoughts:  No  Memory:  Immediate;   Fair Recent;   Improving  Judgement:  Fair  Insight:  Fair  Psychomotor Activity:  Normal  Concentration:  Concentration: Fair and Attention Span: Fair  Recall:  [employer] of Knowledge:  Good  Language:  Good  Akathisia:  Negative  Handed:  Right  AIMS (if indicated):     Assets:  Communication Skills Resilience  ADL's:  Intact  Cognition:  WNL  Sleep:   fair    Briant Cedar 02/10/2021, 2:41 PM

## 2021-02-10 NOTE — TOC Progression Note (Signed)
Transition of Care [facility]) - Progression Note    Patient Details  Name: Carmen Daniels MRN: [id number] Date of Birth: May 23, 1985  Transition of Care [facility]) CM/SW [place], [place] Phone Number: 02/10/2021, 4:38 PM  Clinical Narrative:     CSW informed by [facility] staff that they cannot accept pt due to her PT/OT needs.   CSW faxed additional referrals to the following psych facilities:  Destination Service Provider Request Status Selected Services Address Phone Fax Patient Preferred  [place] [place] [address] [phone] --  [place] [address] [place] [address] [phone] --  [place] [address] [place] [address] [phone] --  [facility]  Pending - Request Sent N/A [address] [place] [address] [phone] --  [facility]  Pending - Request Sent N/A [address] [place] [address] [phone] --  [facility]  Pending - Request Sent N/A [place], [place] [address] [phone] --  [facility]  Pending - Request Sent N/A [phone] [address] [place] [address] [place] --  [facility]  Pending - Request Sent N/A [address] [place], [place] [address] [phone] --  [facility]  Pending - Request Sent N/A [address] [place] [address] [phone] --  [facility]  Pending - Request Sent N/A [address], [place] [address] [phone] --  [place] Sent N/A [address], [place] [address] [phone] --   [place] N/A [address], [place] [address] [phone] [facility]  Pending - Request Sent N/A [address] [place] [address] [phone] --  [facility]  Pending - Request Sent N/A [place], [place] [address] [phone] --  [facility]  Pending - Request Sent N/A [address] [place] [address]       Expected Discharge Plan: [facility]    Expected Discharge Plan and Services Expected Discharge Plan: [place] arrangements for the past 2 months: Single Family Home                                       Social Determinants of Health (SDOH) Interventions    Readmission Risk Interventions No flowsheet data found.

## 2021-02-10 NOTE — Progress Notes (Addendum)
PROGRESS NOTE    Carmen Daniels  TIW:[id number] DOB: 1986-01-05 DOA: 02/07/2021 PCP: [name], MD    Chief Complaint  Patient presents with   Possible Drug Overdose    Brief Narrative:  35 yo F PMH Bipolar disorder, OCD, Depression with SI, fibromyalgia, CPS, BPD, migraines,  who presented to ED 12/17 following suspected intentional overdose. Pt was reportedly on phone with a friend when she took " 300mg " of undisclosed substance and that she "no longer wanted to live and wanted to end her life." EMS was called and found multiple empty bottles next to patient including ambien, clonazepam, sumatriptan -- unknown if other substances ingested concomitantly. EMS reported that on their arrival pt denied SI. Narcan was given by EMS without change in status. In ED, pt noted to briefly arouse only to sternal rub. Poison control was contacted and recommended close observation.    Pt was admitted to [facility] but on follow up exam noted to be obtunded with agonal respirations and no response to deep sternal rub or jaw thrust. Nasal trumpet placed and PCCM called for emergent intubation and ICU admission.  Pt started on epinephrine and intubated, pt extubated on 02/08/21 and transferred to [facility] on 02/09/21. Pt seen and examined, d/c central line.   Assessment & Plan:   Principal Problem:   Intentional overdose ([name]) Active Problems:   QT prolongation   Adjustment disorder with mixed anxiety and depressed mood   Bipolar I disorder, most recent episode depressed ([place])   Acute metabolic encephalopathy due to intentional overdose from suicide attempt Patient was found to have empty bottles on Ambien, clonazepam, sumatriptan. Patient is alert and oriented this morning. Patient currently off all her psychiatric medications. Psychiatry consulted and plan for [facility] admission.  Continue with sitter.    Acute respiratory failure with hypercarbia due to hypoventilation in the setting of toxic metabolic  encephalopathy. patient was intubated and extubated on 02/08/2021. Patient was started on broad-spectrum IV antibiotics, transition to oral levaquin for 3 more days to complete the course.    Drug-induced hypotension and bradycardia Weaned off epinephrine gtt. Check EKG today.     History of bipolar disorder, schizoaffective disorder Suicidal ideation. Psychiatric evaluation as patient is medically stable.   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  Restart home pain meds.    Hypokalemia, hypomagnesemia , hypophosphatemia Replaced. Repeat levels wnl.   Normocytic anemia:  - hemoglobin around 10.  Therapy evaluations recommending Home health PT.     DVT prophylaxis: Heparin Code Status: Full code.  Family Communication: none at bedside.  Disposition:   Status is: Inpatient  Remains inpatient appropriate because: plan for [facility] when bed available .         Consultants:  Psychiatry.   Procedures:R IJ CVC placement.  Extubation on 02/08/21  Antimicrobials:  Antibiotics Given (last 72 hours)     Date/Time Action Medication Dose Rate   02/07/21 2235 New Bag/Given   aztreonam (AZACTAM) 1 g in sodium chloride 0.9 % 100 mL IVPB 1 g 200 mL/hr   02/07/21 2333 New Bag/Given   vancomycin (VANCOREADY) IVPB 1250 mg/250 mL 1,250 mg 166.7 mL/hr   02/08/21 0514 New Bag/Given   aztreonam (AZACTAM) 1 g in sodium chloride 0.9 % 100 mL IVPB 1 g 200 mL/hr   02/08/21 1100 New Bag/Given   vancomycin (VANCOREADY) IVPB 1250 mg/250 mL 1,250 mg 166.7 mL/hr   02/08/21 1304 New Bag/Given  aztreonam (AZACTAM) 1 g in sodium chloride 0.9 % 100 mL IVPB 1 g 200 mL/hr   02/08/21 2153 New Bag/Given   aztreonam (AZACTAM) 1 g in sodium chloride 0.9 % 100 mL IVPB 1 g 200 mL/hr   02/08/21 2314 New Bag/Given   vancomycin (VANCOREADY) IVPB 1250 mg/250 mL 1,250 mg 166.7 mL/hr   02/09/21 0601 New Bag/Given   aztreonam  (AZACTAM) 1 g in sodium chloride 0.9 % 100 mL IVPB 1 g 200 mL/hr   02/09/21 0908 New Bag/Given   vancomycin (VANCOREADY) IVPB 1250 mg/250 mL 1,250 mg 166.7 mL/hr   02/09/21 1413 New Bag/Given   aztreonam (AZACTAM) 1 g in sodium chloride 0.9 % 100 mL IVPB 1 g 200 mL/hr   02/10/21 0947 Given   levofloxacin (LEVAQUIN) tablet 500 mg 500 mg         Subjective: Leg pain.   Objective: Vitals:   02/10/21 0500 02/10/21 0600 02/10/21 0712 02/10/21 1059  BP:  104/63    Pulse: (!) 50 (!) 55    Resp: 11 19    Temp:   98.9 F (37.2 C) 98.4 F (36.9 C)  TempSrc:   Oral Oral  SpO2: 99% 99%    Weight: 69.8 kg     Height:        Intake/Output Summary (Last 24 hours) at 02/10/2021 1328 Last data filed at 02/10/2021 0952 Gross per 24 hour  Intake 1150 ml  Output --  Net 1150 ml   Filed Weights   02/08/21 0500 02/09/21 0500 02/10/21 0500  Weight: 70.7 kg 75.1 kg 69.8 kg    Examination:  General exam: Appears calm and comfortable  Respiratory system: Clear to auscultation. Respiratory effort normal. Cardiovascular system: S1 & S2 heard, RRR. No JVD,. No pedal edema. Gastrointestinal system: Abdomen is nondistended, soft and nontender. Normal bowel sounds heard. Central nervous system: Alert and oriented. No focal neurological deficits. Extremities: Sno pedal edema.  Skin: No rashes, lesions or ulcers Psychiatry: flat affect      Data Reviewed: I have personally reviewed following labs and imaging studies  CBC: Recent Labs  Lab 02/07/21 0200 02/07/21 0544 02/07/21 0918 02/07/21 1052 02/08/21 0334 02/09/21 0355  WBC 6.5  --  8.2  --  11.6* 11.4*  NEUTROABS 3.9  --   --   --   --   --   HGB 15.3* 12.9 11.1* 10.5* 11.0* 10.2*  HCT 45.7 38.0 32.5* 31.0* 32.3* 30.6*  MCV 85.9  --  87.8  --  85.7 88.4  PLT 323  --  206  --  325 626    Basic Metabolic Panel: Recent Labs  Lab 02/07/21 0200 02/07/21 0544 02/07/21 0918 02/07/21 1052 02/07/21 1849 02/08/21 0334  02/08/21 1309 02/08/21 1554 02/09/21 0355  NA 139   < >  --  143 139 141 143  --  140  K 2.9*   < >  --  3.3* 2.3* 3.5 4.4  --  4.0  CL 104  --   --   --  111 114* 113*  --  109  CO2 25  --   --   --  18* 24 26  --  27  GLUCOSE 116*  --   --   --  280* 102* 82  --  84  BUN <5*  --   --   --  7 7 7   --  <5*  CREATININE 0.73  --  0.62  --  0.80 0.67 0.54  --  0.57  CALCIUM 9.8  --   --   --  8.5* 8.4* 7.9*  --  8.3*  MG  --    < > 1.6*  --  1.6* 2.2 2.0 1.9 1.9  PHOS  --   --  1.7*  --  1.1* <1.0* 3.4 2.7  --    < > = values in this interval not displayed.    GFR: Estimated Creatinine Clearance: 107.1 mL/min (by C-G formula based on SCr of 0.57 mg/dL).  Liver Function Tests: Recent Labs  Lab 02/07/21 0200  AST 25  ALT 19  ALKPHOS 58  BILITOT 1.1  PROT 7.0  ALBUMIN 4.1    CBG: Recent Labs  Lab 02/09/21 1910 02/09/21 2308 02/10/21 0316 02/10/21 0709 02/10/21 1058  GLUCAP 120* 118* 107* 106* 77     Recent Results (from the past 240 hour(s))  Resp Panel by RT-PCR (Flu A&B, Covid) Nasopharyngeal Swab     Status: None   Collection Time: 02/07/21  2:02 AM   Specimen: Nasopharyngeal Swab; Nasopharyngeal(NP) swabs in vial transport medium  Result Value Ref Range Status   SARS Coronavirus 2 by RT PCR NEGATIVE NEGATIVE Final    Comment: (NOTE) SARS-CoV-2 target nucleic acids are NOT DETECTED.  The SARS-CoV-2 RNA is generally detectable in upper respiratory specimens during the acute phase of infection. The lowest concentration of SARS-CoV-2 viral copies this assay can detect is 138 copies/mL. A negative result does not preclude SARS-Cov-2 infection and should not be used as the sole basis for treatment or other patient management decisions. A negative result may occur with  improper specimen collection/handling, submission of specimen other than nasopharyngeal swab, presence of viral mutation(s) within the areas targeted by this assay, and inadequate number of  viral copies(<138 copies/mL). A negative result must be combined with clinical observations, patient history, and epidemiological information. The expected result is Negative.  Fact Sheet for Patients:  EntrepreneurPulse.com.au  Fact Sheet for Healthcare Providers:  IncredibleEmployment.be  This test is no t yet approved or cleared by the [place] FDA and  has been authorized for detection and/or diagnosis of SARS-CoV-2 by FDA under an Emergency Use Authorization (EUA). This EUA will remain  in effect (meaning this test can be used) for the duration of the COVID-19 declaration under Section 564(b)(1) of the Act, 21 U.S.C.section 360bbb-3(b)(1), unless the authorization is terminated  or revoked sooner.       Influenza A by PCR NEGATIVE NEGATIVE Final   Influenza B by PCR NEGATIVE NEGATIVE Final    Comment: (NOTE) The Xpert Xpress SARS-CoV-2/FLU/RSV plus assay is intended as an aid in the diagnosis of influenza from Nasopharyngeal swab specimens and should not be used as a sole basis for treatment. Nasal washings and aspirates are unacceptable for Xpert Xpress SARS-CoV-2/FLU/RSV testing.  Fact Sheet for Patients: EntrepreneurPulse.com.au  Fact Sheet for Healthcare Providers: IncredibleEmployment.be  This test is not yet approved or cleared by the [place] FDA and has been authorized for detection and/or diagnosis of SARS-CoV-2 by FDA under an Emergency Use Authorization (EUA). This EUA will remain in effect (meaning this test can be used) for the duration of the COVID-19 declaration under Section 564(b)(1) of the Act, 21 U.S.C. section 360bbb-3(b)(1), unless the authorization is terminated or revoked.  Performed at [facility], [place] [address], [place], [place] [address]   C Difficile Quick Screen w PCR reflex     Status: None   Collection Time: 02/07/21 10:44 AM   Specimen:  STOOL   Result Value Ref Range Status   C Diff antigen NEGATIVE NEGATIVE Final   C Diff toxin NEGATIVE NEGATIVE Final   C Diff interpretation No C. difficile detected.  Final    Comment: Performed at [facility], [place] [address] [place], [place] [address]  Culture, blood (routine x 2)     Status: None (Preliminary result)   Collection Time: 02/07/21 11:00 AM   Specimen: BLOOD RIGHT ARM  Result Value Ref Range Status   Specimen Description BLOOD RIGHT ARM  Final   Special Requests   Final    BOTTLES DRAWN AEROBIC AND ANAEROBIC Blood Culture adequate volume   Culture   Final    NO GROWTH 3 DAYS Performed at [facility], [address] [place], [place] [address]    Report Status PENDING  Incomplete  Culture, blood (routine x 2)     Status: None (Preliminary result)   Collection Time: 02/07/21 11:20 AM   Specimen: BLOOD  Result Value Ref Range Status   Specimen Description BLOOD SITE NOT SPECIFIED  Final   Special Requests   Final    BOTTLES DRAWN AEROBIC AND ANAEROBIC Blood Culture results may not be optimal due to an excessive volume of blood received in culture bottles   Culture   Final    NO GROWTH 3 DAYS Performed at [facility], [place] [address] [place], [place] [address]    Report Status PENDING  Incomplete  MRSA Next Gen by PCR, Nasal     Status: Abnormal   Collection Time: 02/07/21  4:51 PM   Specimen: Nasal Mucosa; Nasal Swab  Result Value Ref Range Status   MRSA by PCR Next Gen DETECTED (A) NOT DETECTED Final    Comment: RESULT CALLED TO, READ BACK BY AND VERIFIED WITH: RN A.WALTON ON [id number] AT 1945 BY E.PARRISH (NOTE) The GeneXpert MRSA Assay (FDA approved for NASAL specimens only), is one component of a comprehensive MRSA colonization surveillance program. It is not intended to diagnose MRSA infection nor to guide or monitor treatment for MRSA infections. Test performance is not FDA approved in patients less than 45 years old. Performed at [facility], [place] [address] [place], [place] [address]          Radiology Studies: DG Chest 2 View  Result Date: 02/09/2021 CLINICAL DATA:  35 year old female with shortness of breath EXAM: CHEST - 2 VIEW COMPARISON:  02/07/2021 FINDINGS: Cardiomediastinal silhouette unchanged in size and contour. No evidence of central vascular congestion. No interlobular septal thickening. Right IJ central venous catheter, with the tip appearing to terminate superior vena cava. No pneumothorax. Improved aeration compared 2 recent chest x-rays, with node definite confluent airspace disease at the right lung base. Vertical linear opacity overlying the right heart border is favored to represent atelectasis given the absence on the lateral view. IMPRESSION: Improved aeration of the lungs, with improved appearance of the right lung base. Presumed atelectasis persists. Unchanged right IJ central venous catheter Electronically Signed   By: Corrie Mckusick D.O.   On: 02/09/2021 13:05        Scheduled Meds:  buprenorphine  4 mg Sublingual Daily   Chlorhexidine Gluconate Cloth  6 each Topical Daily   clonazePAM  1 mg Oral BID   docusate sodium  100 mg Oral BID   heparin  5,000 Units Subcutaneous Q8H   insulin aspart  0-15 Units Subcutaneous Q4H   levofloxacin  500 mg Oral Daily  mouth rinse  15 mL Mouth Rinse BID   mupirocin ointment  1 application Nasal BID   nicotine  14 mg Transdermal Q0600   polyethylene glycol  17 g Oral Daily   sodium chloride flush  10-40 mL Intracatheter Q12H   Continuous Infusions:  sodium chloride Stopped (02/08/21 0737)     LOS: 3 days       Hosie Poisson, MD [facility]   To contact the attending provider between 7A-7P or the covering provider during after hours 7P-7A, please log into the web site [email or web address] and access using universal [place] password for that web site. If you do not have the password, please call the hospital operator.  02/10/2021,  1:28 PM

## 2021-02-10 NOTE — Evaluation (Signed)
Occupational Therapy Evaluation Patient Details Name: Carmen Daniels MRN: [id number] DOB: 12-Jan-1986 Today's Date: 02/10/2021   History of Present Illness 35 yo admitted 12/17 after intentional OD while face timing a friend. VDRF 12/17-12/18. PMhx: bipoloar, schizoaffective disorder, borderline personality disorder, asthma, fibromyalgia, chronic pain, OCD   Clinical Impression   Pt admitted with the above diagnoses and presents with below problem list. Pt will benefit from continued acute OT to address the below listed deficits and maximize independence with basic ADLs prior to d/c to venue below. At baseline, pt endorses being mod I with basic ADLs, uses rw since May d/t nerve damage sustained from fall per her report. Pt currently is mostly supervision level with basic ADLs, min guard assist for functional mobility and shower transfers. Pt A&O x 3, able to engage in basic conversation exchange. Deficits in attention, processing speed, and recall noted; flat affect. Low volition but agreeable to complete all tasks presented.       Recommendations for follow up therapy are one component of a multi-disciplinary discharge planning process, led by the attending physician.  Recommendations may be updated based on patient status, additional functional criteria and insurance authorization.   Follow Up Recommendations  Follow physician's recommendations for discharge plan and follow up therapies (Inpatient behavioral health?)   Assistance Recommended at Discharge Frequent or constant Supervision/Assistance  Functional Status Assessment  Patient has had a recent decline in their functional status and demonstrates the ability to make significant improvements in function in a reasonable and predictable amount of time.  Equipment Recommendations  None recommended by OT    Recommendations for Other Services       Precautions / Restrictions Precautions Precautions: Fall Precaution Comments: pt  reports prior fall as her reason for using a RW Restrictions Weight Bearing Restrictions: No      Mobility Bed Mobility Overal bed mobility: Independent                  Transfers Overall transfer level: Needs assistance Equipment used: Rolling walker (2 wheels) Transfers: Sit to/from Stand Sit to Stand: Supervision           General transfer comment: to/from . good technique with rw      Balance Overall balance assessment: Mild deficits observed, not formally tested                                         ADL either performed or assessed with clinical judgement   ADL Overall ADL's : Needs assistance/impaired Eating/Feeding: Set up   Grooming: Supervision/safety;Wash/dry hands;Standing   Upper Body Bathing: Set up;Sitting   Lower Body Bathing: Supervison/ safety;Sit to/from stand   Upper Body Dressing : Set up;Sitting   Lower Body Dressing: Supervision/safety;Sit to/from stand   Toilet Transfer: Ambulation;Rolling walker (2 wheels);Supervision/safety   Toileting- Water quality scientist and Hygiene: Supervision/safety;Sit to/from stand   Tub/ Shower Transfer: Min guard   Functional mobility during ADLs: Min guard;Rolling walker (2 wheels) General ADL Comments: Pt completed toilet transfer and pericare, 1 grooming task standing at sink at supervision level. Pt then completed household distance functional mobility (~40' in the hall). Limited by nausea, dizziness in upright position, and 7/10 headache.     Vision         Perception     Praxis      Pertinent Vitals/Pain Pain Assessment: 0-10 Pain Score: 7  Pain Location: headache  Pain Descriptors / Indicators: Aching Pain Intervention(s): Monitored during session;Patient requesting pain meds-RN notified;Other (comment) (cool, wet washcloth for forehead/eyes)     Hand Dominance Right   Extremity/Trunk Assessment Upper Extremity Assessment Upper Extremity Assessment: Overall WFL  for tasks assessed   Lower Extremity Assessment Lower Extremity Assessment: Defer to PT evaluation   Cervical / Trunk Assessment Cervical / Trunk Assessment: Normal   Communication Communication Communication: No difficulties   Cognition Arousal/Alertness: Awake/alert Behavior During Therapy: Flat affect Overall Cognitive Status: Impaired/Different from baseline Area of Impairment: Problem solving;Awareness                             Problem Solving: Slow processing General Comments: A&O to person, place, time. Slow processing, Flat affect. Sustained attention level.     General Comments       Exercises     Shoulder Instructions      Home Living Family/patient expects to be discharged to:: Private residence Living Arrangements: Parent Available Help at Discharge: Family;Available 24 hours/day Type of Home: House Home Access: Stairs to enter [employer] of Steps: 1 Entrance Stairs-Rails: None Home Layout: One level     Bathroom Shower/Tub: Teacher, early years/pre: Standard     Home Equipment: Conservation officer, nature (2 wheels);Tub bench;Wheelchair - manual   Additional Comments: Pt with fall and rhabdomyolisis 06/27/20 with resultant nerve injury and had been receiving OPPT until Oct 2022      Prior Functioning/Environment Prior Level of Function : Needs assist  Cognitive Assist : ADLs (cognitive)           Mobility Comments: walks with walker ADLs Comments: pt reports mom does all the homemaking        OT Problem List: Decreased activity tolerance;Impaired balance (sitting and/or standing);Decreased knowledge of use of DME or AE;Decreased knowledge of precautions;Decreased cognition      OT Treatment/Interventions: Self-care/ADL training;DME and/or AE instruction;Balance training;Patient/family education;Cognitive remediation/compensation;Therapeutic activities;Energy conservation    OT Goals(Current goals can be found in the  care plan section) Acute Rehab OT Goals Patient Stated Goal: not stated OT Goal Formulation: With patient Time For Goal Achievement: 02/24/21 Potential to Achieve Goals: Good ADL Goals Pt Will Perform Grooming: with modified independence;standing Additional ADL Goal #1: Pt will demonstrate independence with accurate pathfinding. Additional ADL Goal #2: Pt will independently verbalize 2 energy conservation strategies for managing basic ADL routine.  OT Frequency: Min 2X/week   Barriers to D/C: Decreased caregiver support  per chart review, unclear if pt will be able to return to previous arrangement of living in parent's guest house. Father has dementia per spouse per chart review. Mom has reported difficulty caregiving for both spouse and daughter.       Co-evaluation              AM-PAC OT "6 Clicks" Daily Activity     Outcome Measure Help from another person eating meals?: None Help from another person taking care of personal grooming?: A Little Help from another person toileting, which includes using toliet, bedpan, or urinal?: None Help from another person bathing (including washing, rinsing, drying)?: A Little Help from another person to put on and taking off regular upper body clothing?: None Help from another person to put on and taking off regular lower body clothing?: A Little 6 Click Score: 21   End of Session Equipment Utilized During Treatment: Rolling walker (2 wheels) Nurse Communication: Patient requests pain meds;Other (comment) (Pt  requesting anxiety med)  Activity Tolerance: Patient limited by pain;Patient tolerated treatment well;Other (comment) (+dizzy and nausea in upright position) Patient left: in bed;with call bell/phone within reach;with nursing/sitter in room  OT Visit Diagnosis: Unsteadiness on feet (R26.81);Other symptoms and signs involving cognitive function                Time: 9562-1308 OT Time Calculation (min): 21 min Charges:  OT General  Charges $OT Visit: 1 Visit OT Evaluation $OT Eval Low Complexity: [place], OT Acute Rehabilitation Services Office: [phone]   Hortencia Pilar 02/10/2021, 9:54 AM

## 2021-02-11 LAB — GLUCOSE, CAPILLARY
Glucose-Capillary: 79 mg/dL (ref 70–99)
Glucose-Capillary: 77 mg/dL (ref 70–99)

## 2021-02-11 MED ORDER — BUPRENORPHINE HCL 2 MG SL SUBL
4.0000 mg | SUBLINGUAL_TABLET | Freq: Two times a day (BID) | SUBLINGUAL | Status: DC
  Administered 2021-02-11 – 2021-02-12 (×3): 4 mg via SUBLINGUAL
  Filled 2021-02-11 (×3): qty 2

## 2021-02-11 MED ORDER — ONDANSETRON HCL 4 MG/2ML IJ SOLN: 4.0000 mg | Freq: Four times a day (QID) | INTRAMUSCULAR | Status: DC | PRN

## 2021-02-11 MED ORDER — GABAPENTIN 300 MG PO CAPS
300.0000 mg | ORAL_CAPSULE | Freq: Three times a day (TID) | ORAL | Status: DC
  Administered 2021-02-11 – 2021-02-12 (×6): 300 mg via ORAL
  Filled 2021-02-11 (×6): qty 1

## 2021-02-11 NOTE — Progress Notes (Addendum)
CSW received a phone call from Lajoyce Lauber [phone] who is reviewing and needing a new BP to consider for bed offer. CSW will assist and follow.   Benjaman Kindler, MSW, LCSWA 02/11/2021 12:15 AM

## 2021-02-11 NOTE — TOC Progression Note (Addendum)
Transition of Care [facility]) - Progression Note    Patient Details  Name: Carmen Daniels MRN: [id number] Date of Birth: June 17, 1985  Transition of Care [facility]) CM/SW Contact  Joanne Chars, LCSW Phone Number: 02/11/2021, 10:35 AM  Clinical Narrative:  CSW received request for additional information from [place] at [facility].  P: [phone].  F: [phone].  Information faxed.     1305: CSW spoke with Susie at [facility].  They have declined admission for this pt due to "medical reasons."  Expected Discharge Plan: [facility]    Expected Discharge Plan and Services Expected Discharge Plan: [place] arrangements for the past 2 months: Single Family Home                                       Social Determinants of Health (SDOH) Interventions    Readmission Risk Interventions No flowsheet data found.

## 2021-02-11 NOTE — Progress Notes (Signed)
PT Cancellation Note  Patient Details Name: Carmen Daniels MRN: [id number] DOB: 1986-01-17   Cancelled Treatment:    Reason Eval/Treat Not Completed: Patient declined, no reason specified;Other (comment) (due to neuropathic pain in her feet.) 02/11/2021  Ginger Carne., PT Acute Rehabilitation Services [phone]  (pager) [phone]  (office)  Tessie Fass Shakir Petrosino 02/11/2021, 7:27 PM

## 2021-02-11 NOTE — Progress Notes (Signed)
PROGRESS NOTE        PATIENT DETAILS Name: Carmen Daniels Age: 35 y.o. Sex: female Date of Birth: Feb 18, 1986 Admit Date: 02/07/2021 Admitting Physician Julian Hy, DO PCP:Sun, Mikeal Hawthorne, MD  Brief Narrative: Patient is a 35 y.o. female with history of bipolar disorder, OCD, depression, fibromyalgia, chronic pain syndrome-who presented with a suicidal attempt-EMS found multiple empty bottles of Ambien, Klonopin and sumatriptan.  Patient was very drowsy on initial presentation-however her mental status worsened-she became obtunded with agonal respiration-PCCM was consulted-patient was intubated and subsequently admitted to the ICU.  She was stabilized-extubated on 12/18-and subsequently transferred to the [facility] service.  She has been evaluated by psychiatry with recommendations for inpatient psychiatric admission.  Subjective: Lying comfortably in bed-denies any chest pain or shortness of breath.  Complains of neuropathic pain in her lower extremities.    Objective: Vitals: Blood pressure 117/71, pulse (!) 47, temperature 98.3 F (36.8 C), temperature source Oral, resp. rate 14, height 5\' 11"  (1.803 m), weight 69.8 kg, SpO2 98 %.   Exam: Gen Exam:Alert awake-not in any distress HEENT:atraumatic, normocephalic Chest: B/L clear to auscultation anteriorly CVS:S1S2 regular Abdomen:soft non tender, non distended Extremities:no edema Neurology: Non focal Skin: no rash  Pertinent Labs/Radiology: Recent Labs  Lab 02/07/21 0200 02/07/21 0544 02/09/21 0355 02/10/21 1422  WBC 6.5   < > 11.4*  --   HGB 15.3*   < > 10.2*  --   PLT 323   < > 191  --   NA 139   < > 140 140  K 2.9*   < > 4.0 3.8  CREATININE 0.73   < > 0.57 0.61  AST 25  --   --   --   ALT 19  --   --   --   ALKPHOS 58  --   --   --   BILITOT 1.1  --   --   --    < > = values in this interval not displayed.    Assessment/Plan: Acute toxic encephalopathy due to intentional overdose of  Ambien/Klonopin/Imitrex-with suicidal intent: Encephalopathy has resolved-psychiatry following-recommendations are for inpatient psychiatric admission.  Bedside sitter in place  Acute hypercarbic respiratory failure in the setting of toxic encephalopathy: Respiratory failure and encephalopathy has resolved.  Doing well on room air.  Extubated on 12/18.    Drug-induced hypotension/bradycardia: Resolved  PNA: Afebrile-no leukocytosis-appears well-stop levofloxacin as patient has completed 5 days of treatment.  Chronic pain syndrome/fibromyalgia/peripheral neuropathy: Resume Neurontin-increase Subutex to twice daily dosing.  Per patient-she takes Subutex approximately 4-5 times a day.  History of bipolar disorder/schizophrenia/anxiety disorder/OCD: Psychiatry following-we will defer further care to psychiatry.  BMI: Estimated body mass index is 21.46 kg/m as calculated from the following:   Height as of this encounter: 5\' 11"  (1.803 m).   Weight as of this encounter: 69.8 kg.    Procedures: None Consults: PCCM, psychiatry DVT Prophylaxis: Heparin Code Status:Full code Family Communication: None at bedside  Time spent: 25- minutes-Greater than 50% of this time was spent in counseling, explanation of diagnosis, planning of further management, and coordination of care.  Disposition Plan: Status is: Inpatient  Remains inpatient appropriate because: Awaiting inpatient psychiatry bed.   Diet: Diet Order             Diet regular Room service appropriate? Yes; Fluid consistency: Thin  Diet  effective now                     Antimicrobial agents: Anti-infectives (From admission, onward)    Start     Dose/Rate Route Frequency Ordered Stop   02/10/21 1000  levofloxacin (LEVAQUIN) tablet 500 mg        500 mg Oral Daily 02/09/21 1503     02/07/21 2300  vancomycin (VANCOREADY) IVPB 1250 mg/250 mL  Status:  Discontinued        1,250 mg 166.7 mL/hr over 90 Minutes Intravenous  Every 12 hours 02/07/21 0931 02/09/21 1503   02/07/21 1800  aztreonam (AZACTAM) 1 g in sodium chloride 0.9 % 100 mL IVPB  Status:  Discontinued        1 g 200 mL/hr over 30 Minutes Intravenous Every 8 hours 02/07/21 0934 02/09/21 1503   02/07/21 0945  vancomycin (VANCOREADY) IVPB 1500 mg/300 mL        1,500 mg 150 mL/hr over 120 Minutes Intravenous  Once 02/07/21 0931 02/07/21 1252   02/07/21 0945  aztreonam (AZACTAM) 2 g in sodium chloride 0.9 % 100 mL IVPB        2 g 200 mL/hr over 30 Minutes Intravenous  Once 02/07/21 0934 02/07/21 1341        MEDICATIONS: Scheduled Meds:  buprenorphine  4 mg Sublingual BID   Chlorhexidine Gluconate Cloth  6 each Topical Daily   clonazePAM  1 mg Oral BID   docusate sodium  100 mg Oral BID   gabapentin  300 mg Oral TID   heparin  5,000 Units Subcutaneous Q8H   insulin aspart  0-15 Units Subcutaneous Q4H   levofloxacin  500 mg Oral Daily   mouth rinse  15 mL Mouth Rinse BID   mupirocin ointment  1 application Nasal BID   nicotine  14 mg Transdermal Q0600   polyethylene glycol  17 g Oral Daily   sodium chloride flush  10-40 mL Intracatheter Q12H   Continuous Infusions:  sodium chloride Stopped (02/08/21 0737)   PRN Meds:.acetaminophen, docusate sodium, polyethylene glycol, sodium chloride flush, SUMAtriptan   I have personally reviewed following labs and imaging studies  LABORATORY DATA: CBC: Recent Labs  Lab 02/07/21 0200 02/07/21 0544 02/07/21 0918 02/07/21 1052 02/08/21 0334 02/09/21 0355  WBC 6.5  --  8.2  --  11.6* 11.4*  NEUTROABS 3.9  --   --   --   --   --   HGB 15.3* 12.9 11.1* 10.5* 11.0* 10.2*  HCT 45.7 38.0 32.5* 31.0* 32.3* 30.6*  MCV 85.9  --  87.8  --  85.7 88.4  PLT 323  --  206  --  325 268    Basic Metabolic Panel: Recent Labs  Lab 02/07/21 0918 02/07/21 1052 02/07/21 1849 02/08/21 0334 02/08/21 1309 02/08/21 1554 02/09/21 0355 02/10/21 1422  NA  --    < > 139 141 143  --  140 140  K  --    < >  2.3* 3.5 4.4  --  4.0 3.8  CL  --   --  111 114* 113*  --  109 107  CO2  --   --  18* 24 26  --  27 27  GLUCOSE  --   --  280* 102* 82  --  84 97  BUN  --   --  7 7 7   --  <5* <5*  CREATININE 0.62  --  0.80 0.67 0.54  --  0.57 0.61  CALCIUM  --   --  8.5* 8.4* 7.9*  --  8.3* 9.0  MG 1.6*  --  1.6* 2.2 2.0 1.9 1.9 1.8  PHOS 1.7*  --  1.1* <1.0* 3.4 2.7  --   --    < > = values in this interval not displayed.    GFR: Estimated Creatinine Clearance: 107.1 mL/min (by C-G formula based on SCr of 0.61 mg/dL).  Liver Function Tests: Recent Labs  Lab 02/07/21 0200  AST 25  ALT 19  ALKPHOS 58  BILITOT 1.1  PROT 7.0  ALBUMIN 4.1   No results for input(s): LIPASE, AMYLASE in the last 168 hours. No results for input(s): AMMONIA in the last 168 hours.  Coagulation Profile: No results for input(s): INR, PROTIME in the last 168 hours.  Cardiac Enzymes: Recent Labs  Lab 02/07/21 2200  CKTOTAL 177    BNP (last 3 results) No results for input(s): PROBNP in the last 8760 hours.  Lipid Profile: No results for input(s): CHOL, HDL, LDLCALC, TRIG, CHOLHDL, LDLDIRECT in the last 72 hours.  Thyroid Function Tests: No results for input(s): TSH, T4TOTAL, FREET4, T3FREE, THYROIDAB in the last 72 hours.  Anemia Panel: No results for input(s): VITAMINB12, FOLATE, FERRITIN, TIBC, IRON, RETICCTPCT in the last 72 hours.  Urine analysis:    Component Value Date/Time   COLORURINE YELLOW 02/07/2021 0201   APPEARANCEUR CLEAR 02/07/2021 0201   LABSPEC 1.025 02/07/2021 0201   PHURINE 6.0 02/07/2021 0201   GLUCOSEU NEGATIVE 02/07/2021 0201   HGBUR NEGATIVE 02/07/2021 0201   BILIRUBINUR NEGATIVE 02/07/2021 0201   KETONESUR NEGATIVE 02/07/2021 0201   PROTEINUR NEGATIVE 02/07/2021 0201   UROBILINOGEN 0.2 06/14/2013 1513   NITRITE NEGATIVE 02/07/2021 0201   LEUKOCYTESUR NEGATIVE 02/07/2021 0201    Sepsis Labs: Lactic Acid, Venous    Component Value Date/Time   LATICACIDVEN 1.4 03/31/2019  1027    MICROBIOLOGY: Recent Results (from the past 240 hour(s))  Resp Panel by RT-PCR (Flu A&B, Covid) Nasopharyngeal Swab     Status: None   Collection Time: 02/07/21  2:02 AM   Specimen: Nasopharyngeal Swab; Nasopharyngeal(NP) swabs in vial transport medium  Result Value Ref Range Status   SARS Coronavirus 2 by RT PCR NEGATIVE NEGATIVE Final    Comment: (NOTE) SARS-CoV-2 target nucleic acids are NOT DETECTED.  The SARS-CoV-2 RNA is generally detectable in upper respiratory specimens during the acute phase of infection. The lowest concentration of SARS-CoV-2 viral copies this assay can detect is 138 copies/mL. A negative result does not preclude SARS-Cov-2 infection and should not be used as the sole basis for treatment or other patient management decisions. A negative result may occur with  improper specimen collection/handling, submission of specimen other than nasopharyngeal swab, presence of viral mutation(s) within the areas targeted by this assay, and inadequate number of viral copies(<138 copies/mL). A negative result must be combined with clinical observations, patient history, and epidemiological information. The expected result is Negative.  Fact Sheet for Patients:  EntrepreneurPulse.com.au  Fact Sheet for Healthcare Providers:  IncredibleEmployment.be  This test is no t yet approved or cleared by the [place] FDA and  has been authorized for detection and/or diagnosis of SARS-CoV-2 by FDA under an Emergency Use Authorization (EUA). This EUA will remain  in effect (meaning this test can be used) for the duration of the COVID-19 declaration under Section 564(b)(1) of the Act, 21 U.S.C.section 360bbb-3(b)(1), unless the authorization is terminated  or revoked sooner.  Influenza A by PCR NEGATIVE NEGATIVE Final   Influenza B by PCR NEGATIVE NEGATIVE Final    Comment: (NOTE) The Xpert Xpress SARS-CoV-2/FLU/RSV plus  assay is intended as an aid in the diagnosis of influenza from Nasopharyngeal swab specimens and should not be used as a sole basis for treatment. Nasal washings and aspirates are unacceptable for Xpert Xpress SARS-CoV-2/FLU/RSV testing.  Fact Sheet for Patients: EntrepreneurPulse.com.au  Fact Sheet for Healthcare Providers: IncredibleEmployment.be  This test is not yet approved or cleared by the [place] FDA and has been authorized for detection and/or diagnosis of SARS-CoV-2 by FDA under an Emergency Use Authorization (EUA). This EUA will remain in effect (meaning this test can be used) for the duration of the COVID-19 declaration under Section 564(b)(1) of the Act, 21 U.S.C. section 360bbb-3(b)(1), unless the authorization is terminated or revoked.  Performed at [facility], [place] [address], [place], [place] [address]   C Difficile Quick Screen w PCR reflex     Status: None   Collection Time: 02/07/21 10:44 AM   Specimen: STOOL  Result Value Ref Range Status   C Diff antigen NEGATIVE NEGATIVE Final   C Diff toxin NEGATIVE NEGATIVE Final   C Diff interpretation No C. difficile detected.  Final    Comment: Performed at [facility], [place] [address], [place], [place] [address]  Culture, blood (routine x 2)     Status: None (Preliminary result)   Collection Time: 02/07/21 11:00 AM   Specimen: BLOOD RIGHT ARM  Result Value Ref Range Status   Specimen Description BLOOD RIGHT ARM  Final   Special Requests   Final    BOTTLES DRAWN AEROBIC AND ANAEROBIC Blood Culture adequate volume   Culture   Final    NO GROWTH 3 DAYS Performed at [facility], [address] [place], [place] [address]    Report Status PENDING  Incomplete  Culture, blood (routine x 2)     Status: None (Preliminary result)   Collection Time: 02/07/21 11:20 AM   Specimen: BLOOD  Result Value Ref Range Status   Specimen Description BLOOD SITE NOT  SPECIFIED  Final   Special Requests   Final    BOTTLES DRAWN AEROBIC AND ANAEROBIC Blood Culture results may not be optimal due to an excessive volume of blood received in culture bottles   Culture   Final    NO GROWTH 3 DAYS Performed at [facility], [place] [address], [place], [place] [address]    Report Status PENDING  Incomplete  MRSA Next Gen by PCR, Nasal     Status: Abnormal   Collection Time: 02/07/21  4:51 PM   Specimen: Nasal Mucosa; Nasal Swab  Result Value Ref Range Status   MRSA by PCR Next Gen DETECTED (A) NOT DETECTED Final    Comment: RESULT CALLED TO, READ BACK BY AND VERIFIED WITH: RN A.WALTON ON [id number] AT 1945 BY E.PARRISH (NOTE) The GeneXpert MRSA Assay (FDA approved for NASAL specimens only), is one component of a comprehensive MRSA colonization surveillance program. It is not intended to diagnose MRSA infection nor to guide or monitor treatment for MRSA infections. Test performance is not FDA approved in patients less than 28 years old. Performed at [facility], [place] [address] [place], [place] [address]     RADIOLOGY STUDIES/RESULTS: DG Chest 2 View  Result Date: 02/09/2021 CLINICAL DATA:  35 year old female with shortness of breath EXAM: CHEST - 2 VIEW COMPARISON:  02/07/2021 FINDINGS: Cardiomediastinal silhouette unchanged in size and contour. No evidence of central vascular congestion. No interlobular septal thickening. Right IJ central venous catheter, with the tip appearing to terminate superior vena cava. No pneumothorax. Improved aeration compared 2 recent chest x-rays, with node definite confluent airspace disease at the right lung base. Vertical linear opacity overlying the right heart border is favored to represent atelectasis given the absence on the lateral view. IMPRESSION: Improved aeration of the lungs, with improved appearance of the right lung base. Presumed atelectasis persists. Unchanged right IJ central venous catheter  Electronically Signed   By: Corrie Mckusick D.O.   On: 02/09/2021 13:05     LOS: 4 days   Oren Binet, MD  [facility]    To contact the attending provider between 7A-7P or the covering provider during after hours 7P-7A, please log into the web site [email or web address] and access using universal [employer] password for that web site. If you do not have the password, please call the hospital operator.  02/11/2021, 10:02 AM

## 2021-02-12 ENCOUNTER — Ambulatory Visit: Payer: Medicaid Other | Admitting: Neurology

## 2021-02-12 LAB — CULTURE, BLOOD (ROUTINE X 2)
Culture: NO GROWTH
Culture: NO GROWTH
Special Requests: ADEQUATE

## 2021-02-12 MED ORDER — BUPRENORPHINE HCL 2 MG SL SUBL
4.0000 mg | SUBLINGUAL_TABLET | Freq: Three times a day (TID) | SUBLINGUAL | Status: DC
  Administered 2021-02-12 – 2021-02-18 (×18): 4 mg via SUBLINGUAL
  Filled 2021-02-12 (×18): qty 2

## 2021-02-12 NOTE — Progress Notes (Signed)
Occupational Therapy Treatment Patient Details Name: Carmen Daniels MRN: [id number] DOB: 10-23-1985 Today's Date: 02/12/2021   History of present illness 35 yo admitted 12/17 after intentional OD while face timing a friend. VDRF 12/17-12/18. PMhx: bipoloar, schizoaffective disorder, borderline personality disorder, asthma, fibromyalgia, chronic pain, OCD   OT comments  Patient received in bed and stated she was having neuropathy pain and did not want to get up.  Following discussion with COTA patient was willing to ambulate to bathroom.  Patient was independent with bed mobility and distant supervision to ambulate to bathroom.  Patient was modified independent with toilet hygiene and grooming standing at sink. Patient returned to bed and was provided energy conservation strategies handout and reviewed with patient. Acute OT to continue to follow.    Recommendations for follow up therapy are one component of a multi-disciplinary discharge planning process, led by the attending physician.  Recommendations may be updated based on patient status, additional functional criteria and insurance authorization.    Follow Up Recommendations  Follow physician's recommendations for discharge plan and follow up therapies    Assistance Recommended at Discharge Frequent or constant Supervision/Assistance  Equipment Recommendations  None recommended by OT    Recommendations for Other Services      Precautions / Restrictions Precautions Precautions: Fall Precaution Comments: pt reports prior fall as her reason for using a RW Restrictions Weight Bearing Restrictions: No       Mobility Bed Mobility Overal bed mobility: Independent                  Transfers Overall transfer level: Needs assistance Equipment used: Rolling walker (2 wheels) Transfers: Sit to/from Stand Sit to Stand: Modified independent (Device/Increase time)           General transfer comment: distant supervision  during mobilty     Balance Overall balance assessment: Mild deficits observed, not formally tested                                         ADL either performed or assessed with clinical judgement   ADL Overall ADL's : Needs assistance/impaired     Grooming: Wash/dry hands;Wash/dry face;Standing;Modified independent Grooming Details (indicate cue type and reason): stood at sink to perform hand hygiene following bathroom visit                 Toilet Transfer: Ambulation;Rolling walker (2 wheels);Supervision/safety Toilet Transfer Details (indicate cue type and reason): distant supervision for mobility and toilet transfer with RW Toileting- Clothing Manipulation and Hygiene: Modified independent       Functional mobility during ADLs: Supervision/safety;Rolling walker (2 wheels) General ADL Comments: distant supervision for mobility    Extremity/Trunk Assessment              Vision       Perception     Praxis      Cognition Arousal/Alertness: Awake/alert Behavior During Therapy: Flat affect Overall Cognitive Status: Impaired/Different from baseline Area of Impairment: Problem solving;Awareness                             Problem Solving: Slow processing General Comments: A&O x4          Exercises     Shoulder Instructions       General Comments energy conservation stategy handout provided and discussed    Pertinent  Vitals/ Pain       Pain Assessment: Faces Faces Pain Scale: Hurts even more Pain Location: neuropathy pain Pain Descriptors / Indicators: Aching;Burning Pain Intervention(s): Limited activity within patient's tolerance;Repositioned;Monitored during session  Home Living                                          Prior Functioning/Environment              Frequency  Min 2X/week        Progress Toward Goals  OT Goals(current goals can now be found in the care plan section)   Progress towards OT goals: Progressing toward goals  Acute Rehab OT Goals Patient Stated Goal: decrease neuropathy pain OT Goal Formulation: With patient Time For Goal Achievement: 02/24/21 Potential to Achieve Goals: Good ADL Goals Pt Will Perform Grooming: with modified independence;standing Additional ADL Goal #1: Pt will demonstrate independence with accurate pathfinding. Additional ADL Goal #2: Pt will independently verbalize 2 energy conservation strategies for managing basic ADL routine.  Plan Discharge plan remains appropriate    Co-evaluation                 AM-PAC OT "6 Clicks" Daily Activity     Outcome Measure   Help from another person eating meals?: None Help from another person taking care of personal grooming?: A Little Help from another person toileting, which includes using toliet, bedpan, or urinal?: None Help from another person bathing (including washing, rinsing, drying)?: A Little Help from another person to put on and taking off regular upper body clothing?: None Help from another person to put on and taking off regular lower body clothing?: A Little 6 Click Score: 21    End of Session Equipment Utilized During Treatment: Rolling walker (2 wheels)  OT Visit Diagnosis: Unsteadiness on feet (R26.81);Other symptoms and signs involving cognitive function   Activity Tolerance Patient limited by pain;Patient tolerated treatment well;Other (comment)   Patient Left in bed;with call bell/phone within reach;with nursing/sitter in room   Nurse Communication Mobility status        Time: 2229-7989 OT Time Calculation (min): 13 min  Charges: OT General Charges $OT Visit: 1 Visit OT Treatments $Self Care/Home Management : 8-22 mins  Lodema Hong, [place]  Pager [phone] Office [place] 02/12/2021, 2:29 PM

## 2021-02-12 NOTE — Consult Note (Signed)
Reason for Consult: Suicide Attempt Overdose, Bipolar, off medications Referring Physician: Hosie Poisson, MD     Assessment/Plan: Carmen Daniels is a 35 y.o. female admitted medically for 02/07/2021  1:45 AM for Suicide Attempt Overdose.  She carries the psychiatric diagnoses of Bipolar Disorder, Schizophrenia, Borderline Personality Disorder, Anxiety Disorder, and OCD and has a past medical history of fibromyalgia, complex regional pain syndrome and weakness after an episode of rhabdomyolysis in early 2022.  Psychiatry was consulted for Suicide Attempt Overdose, Bipolar, off medications.   She meets criteria for Inpatient Psychiatric Hospitalization based on her Suicide Attempt via Overdose.  Outpatient psychotropic medications include none since March and historically has had side effects to these medications requiring multiple trials.  She was not compliant with medications prior to admission as evidenced by patient and mother reporting stopping medication in March.   Patient has not had any behavioral issues but is having significant issues with anxiety and pain in her legs.  We will recommend that her frequency of Klonopin be increased.  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   Recommendations: -Inpatient Psychiatric Hospitalization -Increase frequency of Klonopin to 1 mg TID -Continue Safety Sitter and Suicide Precautions   Continue rest of care per Primary Team These recommendations have been discussed with the Primary Team Psychiatry will continue to follow the patient   Carmen Daniels is an 35 y.o. female.  HPI: 35 yo F PMH Bipolar disorder, OCD, Depression with SI, fibromyalgia, CPS, BPD, migraines,  who presented to ED 12/17 following suspected intentional overdose. Pt was reportedly on phone with a friend when she took " 300mg " of undisclosed substance and that she  "no longer wanted to live and wanted to end her life." EMS was called and found multiple empty bottles next to patient including ambien, clonazepam, sumatriptan -- unknown if other substances ingested concomitantly. EMS reported that on their arrival pt denied SI. Narcan was given by EMS without change in status. In ED, pt noted to briefly arouse only to sternal rub. Poison control was contacted and recommended close observation.    On interview today patient reports she is doing "alright."  She reports her sleep is improved now that she is not in the ICU.  She reports her appetite is still not that good.  She reports no SI, HI, or AVH.  Discussed with her that our recommendations are still for inpatient psychiatric treatment and that her information has been sent out to facilities and we are awaiting responses of available beds.  She states she understands that at this point is awaiting game.  She states that she understands why her anxiety and pain medications were reduced after her suicide attempt but does state that her anxiety is not currently being well controlled.  She currently rates her anxiety as a 7 out of 10 (10 being the worst).  She also reports that her pain is limiting her ability to work with PT/OT and so these are not going well.  When asked if she had spoken with her mother she states last time she did was when her mother was at the hospital 3 days ago.  She states that her parents have safely living in [place] and her vacation because they texted the patient's ex-husband and he informed the patient of this.  She reports that the ex-husband will be coming by to visit her later today.  She  reports that currently she does not have a migraine/headache.  She reports that she still does have some nausea which is limiting her ability to eat.  She reports weakness/pain in her legs from her injury sustained several months ago.  She reports no other concerns at present.  Past Medical History:   Diagnosis Date   Allergic rhinitis    Anxiety    Asthma    Bipolar disorder ([place])    Borderline personality disorder ([place])    Bupropion overdose    Chronic pain syndrome    Depression    Fibromyalgia    generalized   Hallucinations    Migraines    Nasal polyps    OCD (obsessive compulsive disorder)    Ovarian cyst    left   Pneumonia    2021   PONV (postoperative nausea and vomiting)    Schizophrenia ([place])    schizoaffective    Past Surgical History:  Procedure Laterality Date   ABDOMINAL HYSTERECTOMY     LAPAROSCOPIC TUBAL LIGATION Bilateral 06/13/2019   Procedure: LAPAROSCOPIC TUBAL LIGATION;  Surgeon: Chancy Milroy, MD;  Location: [place];  Service: Gynecology;  Laterality: Bilateral;  FILSHIE CLIPS   NASAL SINUS SURGERY     TUBAL LIGATION     tubes in ears     VAGINAL HYSTERECTOMY Bilateral 01/15/2020   Procedure: HYSTERECTOMY VAGINAL;  Surgeon: Chancy Milroy, MD;  Location: [place];  Service: Gynecology;  Laterality: Bilateral;   WISDOM TOOTH EXTRACTION      Family History  Problem Relation Age of Onset   Healthy Mother    Healthy Father    Arthritis Maternal Grandmother    Arthritis Maternal Grandfather    Depression Paternal Grandmother    Hyperlipidemia Other    Stroke Other    Hypertension Other    Cancer Other    COPD Other    Asthma Other     Social History:  reports that she has been smoking cigarettes. She has a 7.50 pack-year smoking history. She has never used smokeless tobacco. She reports current drug use. She reports that she does not drink alcohol.  Allergies:  Allergies  Allergen Reactions   Keflex [Cephalexin] Anaphylaxis    Has tolerated amoxicillin 2019/2020    Medications: I have reviewed the patient's current medications. Prior to Admission:  Medications Prior to Admission  Medication Sig Dispense Refill Last Dose   beclomethasone (QVAR) 40 MCG/ACT inhaler Inhale 1 puff into the lungs 2 (two) times daily  as needed (asthma).    unknown   botulinum toxin Type A (BOTOX) 100 units SOLR injection Inject 200 Units into the muscle every 3 (three) months. Inject into head and neck muscles 2 each 3 over 2 months   buprenorphine (SUBUTEX) 2 MG SUBL SL tablet Place 4 mg under the tongue in the morning, at noon, in the evening, and at bedtime.   02/06/2021   clonazePAM (KLONOPIN) 1 MG tablet Take 1 mg by mouth 4 (four) times daily.   Past Week   Fremanezumab-vfrm (AJOVY) 225 MG/1.5ML SOSY INJECT 225 MG INTO THE SKIN EVERY 30 (THIRTY) DAYS. 1.5 mL 11 over 1 month   gabapentin (NEURONTIN) 400 MG capsule Take 400 mg by mouth 4 (four) times daily.   02/06/2021   Multiple Vitamins-Calcium (ONE-A-DAY WOMENS FORMULA PO) Take 1 tablet by mouth daily.   Past Week   SUMAtriptan (IMITREX) 100 MG tablet TAKE 1 TABLET BY MOUTH EVERY 2 HOURS AS NEEDED FOR MIGRAINE. MAY DOSE  2 PER DAY OR 3 TIMES A WEEK. 12 TABLETS PER 30 DAYS, NO EARLY REFILL 12 tablet 2 unknown   zolpidem (AMBIEN) 10 MG tablet Take 10 mg by mouth at bedtime.   Past Week   OXcarbazepine (TRILEPTAL) 150 MG tablet Take 2 tablets (300 mg total) by mouth 2 (two) times daily. (Patient not taking: Reported on 02/08/2021) 120 tablet 11 Completed Course   Scheduled:  buprenorphine  4 mg Sublingual BID   Chlorhexidine Gluconate Cloth  6 each Topical Daily   clonazePAM  1 mg Oral BID   docusate sodium  100 mg Oral BID   gabapentin  300 mg Oral TID   heparin  5,000 Units Subcutaneous Q8H   mouth rinse  15 mL Mouth Rinse BID   nicotine  14 mg Transdermal Q0600   polyethylene glycol  17 g Oral Daily   sodium chloride flush  10-40 mL Intracatheter Q12H   Continuous:  sodium chloride Stopped (02/08/21 0737)   YKD:[id number], docusate sodium, ondansetron (ZOFRAN) IV, polyethylene glycol, sodium chloride flush, SUMAtriptan Anti-infectives (From admission, onward)    Start     Dose/Rate Route Frequency Ordered Stop   02/10/21 1000  levofloxacin (LEVAQUIN)  tablet 500 mg  Status:  Discontinued        500 mg Oral Daily 02/09/21 1503 02/11/21 1010   02/07/21 2300  vancomycin (VANCOREADY) IVPB 1250 mg/250 mL  Status:  Discontinued        1,250 mg 166.7 mL/hr over 90 Minutes Intravenous Every 12 hours 02/07/21 0931 02/09/21 1503   02/07/21 1800  aztreonam (AZACTAM) 1 g in sodium chloride 0.9 % 100 mL IVPB  Status:  Discontinued        1 g 200 mL/hr over 30 Minutes Intravenous Every 8 hours 02/07/21 0934 02/09/21 1503   02/07/21 0945  vancomycin (VANCOREADY) IVPB 1500 mg/300 mL        1,500 mg 150 mL/hr over 120 Minutes Intravenous  Once 02/07/21 0931 02/07/21 1252   02/07/21 0945  aztreonam (AZACTAM) 2 g in sodium chloride 0.9 % 100 mL IVPB        2 g 200 mL/hr over 30 Minutes Intravenous  Once 02/07/21 0934 02/07/21 1341       Results for orders placed or performed during the hospital encounter of 02/07/21 (from the past 48 hour(s))  Glucose, capillary     Status: None   Collection Time: 02/10/21 10:58 AM  Result Value Ref Range   Glucose-Capillary 77 70 - 99 mg/dL    Comment: Glucose reference range applies only to samples taken after fasting for at least 8 hours.  Magnesium     Status: None   Collection Time: 02/10/21  2:22 PM  Result Value Ref Range   Magnesium 1.8 1.7 - 2.4 mg/dL    Comment: Performed at [facility], [place] [address] [place], [place] [address]  Basic metabolic panel     Status: Abnormal   Collection Time: 02/10/21  2:22 PM  Result Value Ref Range   Sodium 140 135 - 145 mmol/L   Potassium 3.8 3.5 - 5.1 mmol/L   Chloride 107 98 - 111 mmol/L   CO2 27 22 - 32 mmol/L   Glucose, Bld 97 70 - 99 mg/dL    Comment: Glucose reference range applies only to samples taken after fasting for at least 8 hours.   BUN <5 (L) 6 - 20 mg/dL   Creatinine, Ser 0.61 0.44 - 1.00 mg/dL   Calcium 9.0  8.9 - 10.3 mg/dL   GFR, Estimated >60 >60 mL/min    Comment: (NOTE) Calculated using the CKD-EPI Creatinine Equation (2021)     Anion gap 6 5 - 15    Comment: Performed at [facility], [place] [address] [place], [place] [address]  Glucose, capillary     Status: None   Collection Time: 02/10/21  4:07 PM  Result Value Ref Range   Glucose-Capillary 93 70 - 99 mg/dL    Comment: Glucose reference range applies only to samples taken after fasting for at least 8 hours.  Glucose, capillary     Status: None   Collection Time: 02/10/21  7:08 PM  Result Value Ref Range   Glucose-Capillary 82 70 - 99 mg/dL    Comment: Glucose reference range applies only to samples taken after fasting for at least 8 hours.  Glucose, capillary     Status: None   Collection Time: 02/10/21 11:07 PM  Result Value Ref Range   Glucose-Capillary 87 70 - 99 mg/dL    Comment: Glucose reference range applies only to samples taken after fasting for at least 8 hours.  Glucose, capillary     Status: None   Collection Time: 02/11/21  3:00 AM  Result Value Ref Range   Glucose-Capillary 79 70 - 99 mg/dL    Comment: Glucose reference range applies only to samples taken after fasting for at least 8 hours.  Glucose, capillary     Status: None   Collection Time: 02/11/21  7:14 AM  Result Value Ref Range   Glucose-Capillary 77 70 - 99 mg/dL    Comment: Glucose reference range applies only to samples taken after fasting for at least 8 hours.    No results found.   Blood pressure 112/74, pulse 85, temperature 98.3 F (36.8 C), temperature source Oral, resp. rate 16, height 5\' 11"  (1.803 m), weight 70 kg, SpO2 99 %. Psychiatric Specialty Exam: Physical Exam Vitals and nursing note reviewed.  Constitutional:      General: She is not in acute distress.    Appearance: Normal appearance. She is normal weight. She is not ill-appearing or toxic-appearing.  HENT:     Head: Normocephalic and atraumatic.  Pulmonary:     Effort: Pulmonary effort is normal.  Neurological:     Mental Status: She is alert and oriented to person, place, and time. Mental  status is at baseline.    Review of Systems  Respiratory:  Negative for cough and shortness of breath.   Cardiovascular:  Negative for chest pain.  Gastrointestinal:  Positive for nausea. Negative for abdominal pain, constipation, diarrhea and vomiting.  Neurological:  Positive for weakness. Negative for headaches.  Psychiatric/Behavioral:  Negative for agitation, hallucinations, sleep disturbance and suicidal ideas. The patient is nervous/anxious.    Blood pressure 112/74, pulse 85, temperature 98.3 F (36.8 C), temperature source Oral, resp. rate 16, height 5\' 11"  (1.803 m), weight 70 kg, SpO2 99 %.Body mass index is 21.52 kg/m.  General Appearance: Casual and Fairly Groomed  Eye Contact:  Good  Speech:  Clear and Coherent and Normal Rate  Volume:  Normal  Mood:  Anxious  Affect:   detached/disinterested  Thought Process:  Coherent  Orientation:  Full (Time, Place, and Person)  Thought Content:  Logical  Suicidal Thoughts:  No  Homicidal Thoughts:  No  Memory:  Immediate;   Fair Recent;   Fair  Judgement:  Intact  Insight:  Present  Psychomotor Activity:  Normal  Concentration:  Concentration: Good and Attention Span: Good  Recall:  Good  Fund of Knowledge:  Good  Language:  Good  Akathisia:  Negative  Handed:  Right  AIMS (if indicated):     Assets:  Resilience  ADL's:  Intact  Cognition:  WNL  Sleep:   fair     Briant Cedar 02/12/2021, 9:55 AM

## 2021-02-12 NOTE — TOC Progression Note (Signed)
Transition of Care [facility]) - Progression Note    Patient Details  Name: Carmen Daniels MRN: [id number] Date of Birth: 03/17/1985  Transition of Care [facility]) CM/SW Com[place], LCSW Phone Number: 02/12/2021, 12:52 PM  Clinical Narrative:    CSW faxed referral to [facility]any[facility]lan[facility], and [facility].    Expected Discharge Plan: [facility]    Expected Discharge Plan and Services Expected Discharge Plan: [facility]       Living arrangements for the past 2 months: Single Family Home                                       Social Determinants of Health (SDOH) Interventions    Readmission Risk Interventions No flowsheet data found.

## 2021-02-12 NOTE — Progress Notes (Signed)
PROGRESS NOTE        PATIENT DETAILS Name: Carmen Daniels Age: 35 y.o. Sex: female Date of Birth: [birth year] Admit Date: 02/07/2021 Admitting Physician Julian Hy, DO PCP:Sun, Mikeal Hawthorne, MD  Brief Narrative: Patient is a 35 y.o. female with history of bipolar disorder, OCD, depression, fibromyalgia, chronic pain syndrome-who presented with a suicidal attempt-EMS found multiple empty bottles of Ambien, Klonopin and sumatriptan.  Patient was very drowsy on initial presentation-however her mental status worsened-she became obtunded with agonal respiration-PCCM was consulted-patient was intubated and subsequently admitted to the ICU.  She was stabilized-extubated on 12/18-and subsequently transferred to the [facility] service.  She has been evaluated by psychiatry with recommendations for inpatient psychiatric admission.  Subjective: Continues to have neuropathic pain-but acknowledges improvement after restarting Neurontin and Subutex.  Asking if her Subutex can be changed to 3 times daily.  Objective: Vitals: Blood pressure 112/74, pulse 85, temperature 98.3 F (36.8 C), temperature source Oral, resp. rate 16, height 5\' 11"  (1.803 m), weight 70 kg, SpO2 99 %.   Exam: Gen Exam:Alert awake-not in any distress HEENT:atraumatic, normocephalic Chest: B/L clear to auscultation anteriorly CVS:S1S2 regular Abdomen:soft non tender, non distended Extremities:no edema Neurology: Non focal Skin: no rash   Pertinent Labs/Radiology: Recent Labs  Lab 02/07/21 0200 02/07/21 0544 02/09/21 0355 02/10/21 1422  WBC 6.5   < > 11.4*  --   HGB 15.3*   < > 10.2*  --   PLT 323   < > 191  --   NA 139   < > 140 140  K 2.9*   < > 4.0 3.8  CREATININE 0.73   < > 0.57 0.61  AST 25  --   --   --   ALT 19  --   --   --   ALKPHOS 58  --   --   --   BILITOT 1.1  --   --   --    < > = values in this interval not displayed.     Assessment/Plan: Acute toxic encephalopathy due to  intentional overdose of Ambien/Klonopin/Imitrex-with suicidal intent: Encephalopathy has resolved-psychiatry following-recommendations are for inpatient psychiatric admission.  Bedside sitter in place  Acute hypercarbic respiratory failure in the setting of toxic encephalopathy: Respiratory failure and encephalopathy has resolved.  Doing well on room air.  Extubated on 12/18.    Drug-induced hypotension/bradycardia: Resolved  PNA: Afebrile-no leukocytosis-completed a course of antimicrobial therapy.  Chronic pain syndrome/fibromyalgia/peripheral neuropathy: Continue Neurontin-change Subutex to 3 times daily dosing.   Per patient-she takes Subutex approximately 4-5 times a day.  History of bipolar disorder/schizophrenia/anxiety disorder/OCD: Psychiatry following-we will defer further care to psychiatry.  BMI: Estimated body mass index is 21.52 kg/m as calculated from the following:   Height as of this encounter: 5\' 11"  (1.803 m).   Weight as of this encounter: 70 kg.    Procedures: None Consults: PCCM, psychiatry DVT Prophylaxis: Heparin Code Status:Full code Family Communication: None at bedside  Time spent: 25- minutes-Greater than 50% of this time was spent in counseling, explanation of diagnosis, planning of further management, and coordination of care.  Disposition Plan: Status is: Inpatient  Remains inpatient appropriate because: Awaiting inpatient psychiatry bed.   Diet: Diet Order             Diet regular Room service appropriate? Yes; Fluid consistency: Thin  Diet  effective now                     Antimicrobial agents: Anti-infectives (From admission, onward)    Start     Dose/Rate Route Frequency Ordered Stop   02/10/21 1000  levofloxacin (LEVAQUIN) tablet 500 mg  Status:  Discontinued        500 mg Oral Daily 02/09/21 1503 02/11/21 1010   02/07/21 2300  vancomycin (VANCOREADY) IVPB 1250 mg/250 mL  Status:  Discontinued        1,250 mg 166.7 mL/hr over  90 Minutes Intravenous Every 12 hours 02/07/21 0931 02/09/21 1503   02/07/21 1800  aztreonam (AZACTAM) 1 g in sodium chloride 0.9 % 100 mL IVPB  Status:  Discontinued        1 g 200 mL/hr over 30 Minutes Intravenous Every 8 hours 02/07/21 0934 02/09/21 1503   02/07/21 0945  vancomycin (VANCOREADY) IVPB 1500 mg/300 mL        1,500 mg 150 mL/hr over 120 Minutes Intravenous  Once 02/07/21 0931 02/07/21 1252   02/07/21 0945  aztreonam (AZACTAM) 2 g in sodium chloride 0.9 % 100 mL IVPB        2 g 200 mL/hr over 30 Minutes Intravenous  Once 02/07/21 0934 02/07/21 1341        MEDICATIONS: Scheduled Meds:  buprenorphine  4 mg Sublingual TID   Chlorhexidine Gluconate Cloth  6 each Topical Daily   clonazePAM  1 mg Oral BID   docusate sodium  100 mg Oral BID   gabapentin  300 mg Oral TID   heparin  5,000 Units Subcutaneous Q8H   mouth rinse  15 mL Mouth Rinse BID   nicotine  14 mg Transdermal Q0600   polyethylene glycol  17 g Oral Daily   sodium chloride flush  10-40 mL Intracatheter Q12H   Continuous Infusions:  sodium chloride Stopped (02/08/21 0737)   PRN Meds:.acetaminophen, docusate sodium, ondansetron (ZOFRAN) IV, polyethylene glycol, sodium chloride flush, SUMAtriptan   I have personally reviewed following labs and imaging studies  LABORATORY DATA: CBC: Recent Labs  Lab 02/07/21 0200 02/07/21 0544 02/07/21 0918 02/07/21 1052 02/08/21 0334 02/09/21 0355  WBC 6.5  --  8.2  --  11.6* 11.4*  NEUTROABS 3.9  --   --   --   --   --   HGB 15.3* 12.9 11.1* 10.5* 11.0* 10.2*  HCT 45.7 38.0 32.5* 31.0* 32.3* 30.6*  MCV 85.9  --  87.8  --  85.7 88.4  PLT 323  --  206  --  325 191     Basic Metabolic Panel: Recent Labs  Lab 02/07/21 0918 02/07/21 1052 02/07/21 1849 02/08/21 0334 02/08/21 1309 02/08/21 1554 02/09/21 0355 02/10/21 1422  NA  --    < > 139 141 143  --  140 140  K  --    < > 2.3* 3.5 4.4  --  4.0 3.8  CL  --   --  111 114* 113*  --  109 107  CO2  --    --  18* 24 26  --  27 27  GLUCOSE  --   --  280* 102* 82  --  84 97  BUN  --   --  7 7 7   --  <5* <5*  CREATININE 0.62  --  0.80 0.67 0.54  --  0.57 0.61  CALCIUM  --   --  8.5* 8.4* 7.9*  --  8.3* 9.0  MG 1.6*  --  1.6* 2.2 2.0 1.9 1.9 1.8  PHOS 1.7*  --  1.1* <1.0* 3.4 2.7  --   --    < > = values in this interval not displayed.     GFR: Estimated Creatinine Clearance: 107.4 mL/min (by C-G formula based on SCr of 0.61 mg/dL).  Liver Function Tests: Recent Labs  Lab 02/07/21 0200  AST 25  ALT 19  ALKPHOS 58  BILITOT 1.1  PROT 7.0  ALBUMIN 4.1    No results for input(s): LIPASE, AMYLASE in the last 168 hours. No results for input(s): AMMONIA in the last 168 hours.  Coagulation Profile: No results for input(s): INR, PROTIME in the last 168 hours.  Cardiac Enzymes: Recent Labs  Lab 02/07/21 2200  CKTOTAL 177     BNP (last 3 results) No results for input(s): PROBNP in the last 8760 hours.  Lipid Profile: No results for input(s): CHOL, HDL, LDLCALC, TRIG, CHOLHDL, LDLDIRECT in the last 72 hours.  Thyroid Function Tests: No results for input(s): TSH, T4TOTAL, FREET4, T3FREE, THYROIDAB in the last 72 hours.  Anemia Panel: No results for input(s): VITAMINB12, FOLATE, FERRITIN, TIBC, IRON, RETICCTPCT in the last 72 hours.  Urine analysis:    Component Value Date/Time   COLORURINE YELLOW 02/07/2021 0201   APPEARANCEUR CLEAR 02/07/2021 0201   LABSPEC 1.025 02/07/2021 0201   PHURINE 6.0 02/07/2021 0201   GLUCOSEU NEGATIVE 02/07/2021 0201   HGBUR NEGATIVE 02/07/2021 0201   BILIRUBINUR NEGATIVE 02/07/2021 0201   KETONESUR NEGATIVE 02/07/2021 0201   PROTEINUR NEGATIVE 02/07/2021 0201   UROBILINOGEN 0.2 06/14/2013 1513   NITRITE NEGATIVE 02/07/2021 0201   LEUKOCYTESUR NEGATIVE 02/07/2021 0201    Sepsis Labs: Lactic Acid, Venous    Component Value Date/Time   LATICACIDVEN 1.4 03/31/2019 1027    MICROBIOLOGY: Recent Results (from the past 240 hour(s))   Resp Panel by RT-PCR (Flu A&B, Covid) Nasopharyngeal Swab     Status: None   Collection Time: 02/07/21  2:02 AM   Specimen: Nasopharyngeal Swab; Nasopharyngeal(NP) swabs in vial transport medium  Result Value Ref Range Status   SARS Coronavirus 2 by RT PCR NEGATIVE NEGATIVE Final    Comment: (NOTE) SARS-CoV-2 target nucleic acids are NOT DETECTED.  The SARS-CoV-2 RNA is generally detectable in upper respiratory specimens during the acute phase of infection. The lowest concentration of SARS-CoV-2 viral copies this assay can detect is 138 copies/mL. A negative result does not preclude SARS-Cov-2 infection and should not be used as the sole basis for treatment or other patient management decisions. A negative result may occur with  improper specimen collection/handling, submission of specimen other than nasopharyngeal swab, presence of viral mutation(s) within the areas targeted by this assay, and inadequate number of viral copies(<138 copies/mL). A negative result must be combined with clinical observations, patient history, and epidemiological information. The expected result is Negative.  Fact Sheet for Patients:  EntrepreneurPulse.com.au  Fact Sheet for Healthcare Providers:  IncredibleEmployment.be  This test is no t yet approved or cleared by the [place] FDA and  has been authorized for detection and/or diagnosis of SARS-CoV-2 by FDA under an Emergency Use Authorization (EUA). This EUA will remain  in effect (meaning this test can be used) for the duration of the COVID-19 declaration under Section 564(b)(1) of the Act, 21 U.S.C.section 360bbb-3(b)(1), unless the authorization is terminated  or revoked sooner.       Influenza A by PCR NEGATIVE NEGATIVE Final   Influenza B by PCR NEGATIVE  NEGATIVE Final    Comment: (NOTE) The Xpert Xpress SARS-CoV-2/FLU/RSV plus assay is intended as an aid in the diagnosis of influenza from  Nasopharyngeal swab specimens and should not be used as a sole basis for treatment. Nasal washings and aspirates are unacceptable for Xpert Xpress SARS-CoV-2/FLU/RSV testing.  Fact Sheet for Patients: EntrepreneurPulse.com.au  Fact Sheet for Healthcare Providers: IncredibleEmployment.be  This test is not yet approved or cleared by the [place] FDA and has been authorized for detection and/or diagnosis of SARS-CoV-2 by FDA under an Emergency Use Authorization (EUA). This EUA will remain in effect (meaning this test can be used) for the duration of the COVID-19 declaration under Section 564(b)(1) of the Act, 21 U.S.C. section 360bbb-3(b)(1), unless the authorization is terminated or revoked.  Performed at [facility], [place] [address], [place], [place] [address]   C Difficile Quick Screen w PCR reflex     Status: None   Collection Time: 02/07/21 10:44 AM   Specimen: STOOL  Result Value Ref Range Status   C Diff antigen NEGATIVE NEGATIVE Final   C Diff toxin NEGATIVE NEGATIVE Final   C Diff interpretation No C. difficile detected.  Final    Comment: Performed at [facility], [place] [address] [place], [place] [address]  Culture, blood (routine x 2)     Status: None   Collection Time: 02/07/21 11:00 AM   Specimen: BLOOD RIGHT ARM  Result Value Ref Range Status   Specimen Description BLOOD RIGHT ARM  Final   Special Requests   Final    BOTTLES DRAWN AEROBIC AND ANAEROBIC Blood Culture adequate volume   Culture   Final    NO GROWTH 5 DAYS Performed at [facility], [address], [place], [place] [address]    Report Status 02/12/2021 FINAL  Final  Culture, blood (routine x 2)     Status: None   Collection Time: 02/07/21 11:20 AM   Specimen: BLOOD  Result Value Ref Range Status   Specimen Description BLOOD SITE NOT SPECIFIED  Final   Special Requests   Final    BOTTLES DRAWN AEROBIC AND ANAEROBIC Blood Culture results  may not be optimal due to an excessive volume of blood received in culture bottles   Culture   Final    NO GROWTH 5 DAYS Performed at [facility], [place] [address] [place], [place] [address]    Report Status 02/12/2021 FINAL  Final  MRSA Next Gen by PCR, Nasal     Status: Abnormal   Collection Time: 02/07/21  4:51 PM   Specimen: Nasal Mucosa; Nasal Swab  Result Value Ref Range Status   MRSA by PCR Next Gen DETECTED (A) NOT DETECTED Final    Comment: RESULT CALLED TO, READ BACK BY AND VERIFIED WITH: RN A.WALTON ON [id number] AT 1945 BY E.PARRISH (NOTE) The GeneXpert MRSA Assay (FDA approved for NASAL specimens only), is one component of a comprehensive MRSA colonization surveillance program. It is not intended to diagnose MRSA infection nor to guide or monitor treatment for MRSA infections. Test performance is not FDA approved in patients less than 31 years old. Performed at [facility], [place] [address], [place], [place] [address]     RADIOLOGY STUDIES/RESULTS: No results found.   LOS: 5 days   Oren Binet, MD  [facility]    To contact the attending provider between 7A-7P or the covering provider during after hours 7P-7A, please log into the web site  [email or web address] and access using universal [place] password for that web site. If you do not have the password, please call the hospital operator.  02/12/2021, 11:24 AM

## 2021-02-12 NOTE — Progress Notes (Signed)
PT Cancellation Note  Patient Details Name: Carmen Daniels MRN: [id number] DOB: [birth year]   Cancelled Treatment:    Reason Eval/Treat Not Completed: Patient declined, no reason specified (pt reports bil foot/calf pain 7/10 and denied any mobility. Pt premedicated with neurontin and subutex an hour prior and aware of this but states medication not impacting pain. Pt states she is walking with RW without assist to bathroom)   Mckinzi Eriksen B Kona Lover 02/12/2021, 10:41 AM Bayard Males, PT Acute Rehabilitation Services Pager: [phone] Office: [phone]

## 2021-02-13 MED ORDER — GABAPENTIN 400 MG PO CAPS
400.0000 mg | ORAL_CAPSULE | Freq: Three times a day (TID) | ORAL | Status: DC
  Administered 2021-02-13 – 2021-02-19 (×19): 400 mg via ORAL
  Filled 2021-02-13 (×19): qty 1

## 2021-02-13 NOTE — TOC Progression Note (Signed)
Transition of Care [facility]) - Progression Note    Patient Details  Name: Carmen Daniels MRN: [id number] Date of Birth: 23-May-1985  Transition of Care [facility]) CM/SW [place], LCSW Phone Number: 02/13/2021, 4:36 PM  Clinical Narrative:    No psych bed offers. Will continue to fax out.    Expected Discharge Plan: [facility]    Expected Discharge Plan and Services Expected Discharge Plan: [place] arrangements for the past 2 months: Single Family Home                                       Social Determinants of Health (SDOH) Interventions    Readmission Risk Interventions No flowsheet data found.

## 2021-02-13 NOTE — Progress Notes (Signed)
0700 - Report received from PM RN.  All questions answered.  Safety checks performed.  Lines verified. Hand hygiene performed before/after each pt contact. 0930 - Assessment & Rx.  Pt refusing colace and Miralax.  She advised that she feels like she hasn't had a bowel movement recently because she hasn't eaten much as she is a picky eater. 1015 - Pt's lunch ordered. 1200 - Pt ate half of her lunch. 1500 - Pt able to use front wheel walker to ambulate to the bathroom. 1615 - Pt's dinner ordered. 1800 - Dinner arrived. 1900 - Report given to PM RN.  All questions answered.

## 2021-02-13 NOTE — Progress Notes (Signed)
PROGRESS NOTE        PATIENT DETAILS Name: MONACA WADAS Age: 35 y.o. Sex: female Date of Birth: 1985/06/16 Admit Date: 02/07/2021 Admitting Physician Julian Hy, DO PCP:Sun, Mikeal Hawthorne, MD  Brief Narrative: Patient is a 35 y.o. female with history of bipolar disorder, OCD, depression, fibromyalgia, chronic pain syndrome-who presented with a suicidal attempt-EMS found multiple empty bottles of Ambien, Klonopin and sumatriptan.  Patient was very drowsy on initial presentation-however her mental status worsened-she became obtunded with agonal respiration-PCCM was consulted-patient was intubated and subsequently admitted to the ICU.  She was stabilized-extubated on 12/18-and subsequently transferred to the [facility] service.  She has been evaluated by psychiatry with recommendations for inpatient psychiatric admission.  Subjective: Sleeping comfortably-still with some neuropathic pain but is chronic-she appears comfortable.  Objective: Vitals: Blood pressure 101/76, pulse 80, temperature 99.1 F (37.3 C), temperature source Oral, resp. rate 18, height 5\' 11"  (1.803 m), weight 66.5 kg, SpO2 99 %.   Exam: Gen Exam:Alert awake-not in any distress HEENT:atraumatic, normocephalic Chest: B/L clear to auscultation anteriorly CVS:S1S2 regular Abdomen:soft non tender, non distended Extremities:no edema Neurology: Non focal Skin: no rash   Pertinent Labs/Radiology: Recent Labs  Lab 02/07/21 0200 02/07/21 0544 02/09/21 0355 02/10/21 1422  WBC 6.5   < > 11.4*  --   HGB 15.3*   < > 10.2*  --   PLT 323   < > 191  --   NA 139   < > 140 140  K 2.9*   < > 4.0 3.8  CREATININE 0.73   < > 0.57 0.61  AST 25  --   --   --   ALT 19  --   --   --   ALKPHOS 58  --   --   --   BILITOT 1.1  --   --   --    < > = values in this interval not displayed.     Assessment/Plan: Acute toxic encephalopathy due to intentional overdose of Ambien/Klonopin/Imitrex-with suicidal intent:  Encephalopathy has resolved-psychiatry following-recommendations are for inpatient psychiatric admission.  Bedside sitter in place.  Awaiting inpatient psychiatry bed.  Acute hypercarbic respiratory failure in the setting of toxic encephalopathy: Respiratory failure and encephalopathy has resolved.  Doing well on room air.  Extubated on 12/18.    Drug-induced hypotension/bradycardia: Resolved  PNA: Afebrile-no leukocytosis-completed a course of antimicrobial therapy.  Chronic pain syndrome/fibromyalgia/peripheral neuropathy: Appears comfortable-continue Neurontin/Subutex-just adjusted on 12/22.  Per patient-she takes Subutex approximately 4-5 times a day.  History of bipolar disorder/schizophrenia/anxiety disorder/OCD: Psychiatry following-we will defer further care to psychiatry.  BMI: Estimated body mass index is 20.45 kg/m as calculated from the following:   Height as of this encounter: 5\' 11"  (1.803 m).   Weight as of this encounter: 66.5 kg.    Procedures: None Consults: PCCM, psychiatry DVT Prophylaxis: Heparin Code Status:Full code Family Communication: None at bedside  Time spent: 25- minutes-Greater than 50% of this time was spent in counseling, explanation of diagnosis, planning of further management, and coordination of care.  Disposition Plan: Status is: Inpatient  Remains inpatient appropriate because: Awaiting inpatient psychiatry bed.   Diet: Diet Order             Diet regular Room service appropriate? Yes; Fluid consistency: Thin  Diet effective now  Antimicrobial agents: Anti-infectives (From admission, onward)    Start     Dose/Rate Route Frequency Ordered Stop   02/10/21 1000  levofloxacin (LEVAQUIN) tablet 500 mg  Status:  Discontinued        500 mg Oral Daily 02/09/21 1503 02/11/21 1010   02/07/21 2300  vancomycin (VANCOREADY) IVPB 1250 mg/250 mL  Status:  Discontinued        1,250 mg 166.7 mL/hr over 90 Minutes  Intravenous Every 12 hours 02/07/21 0931 02/09/21 1503   02/07/21 1800  aztreonam (AZACTAM) 1 g in sodium chloride 0.9 % 100 mL IVPB  Status:  Discontinued        1 g 200 mL/hr over 30 Minutes Intravenous Every 8 hours 02/07/21 0934 02/09/21 1503   02/07/21 0945  vancomycin (VANCOREADY) IVPB 1500 mg/300 mL        1,500 mg 150 mL/hr over 120 Minutes Intravenous  Once 02/07/21 0931 02/07/21 1252   02/07/21 0945  aztreonam (AZACTAM) 2 g in sodium chloride 0.9 % 100 mL IVPB        2 g 200 mL/hr over 30 Minutes Intravenous  Once 02/07/21 0934 02/07/21 1341        MEDICATIONS: Scheduled Meds:  buprenorphine  4 mg Sublingual TID   Chlorhexidine Gluconate Cloth  6 each Topical Daily   clonazePAM  1 mg Oral BID   docusate sodium  100 mg Oral BID   gabapentin  400 mg Oral TID   heparin  5,000 Units Subcutaneous Q8H   mouth rinse  15 mL Mouth Rinse BID   nicotine  14 mg Transdermal Q0600   polyethylene glycol  17 g Oral Daily   sodium chloride flush  10-40 mL Intracatheter Q12H   Continuous Infusions:  sodium chloride Stopped (02/08/21 0737)   PRN Meds:.acetaminophen, docusate sodium, ondansetron (ZOFRAN) IV, polyethylene glycol, sodium chloride flush, SUMAtriptan   I have personally reviewed following labs and imaging studies  LABORATORY DATA: CBC: Recent Labs  Lab 02/07/21 0200 02/07/21 0544 02/07/21 0918 02/07/21 1052 02/08/21 0334 02/09/21 0355  WBC 6.5  --  8.2  --  11.6* 11.4*  NEUTROABS 3.9  --   --   --   --   --   HGB 15.3* 12.9 11.1* 10.5* 11.0* 10.2*  HCT 45.7 38.0 32.5* 31.0* 32.3* 30.6*  MCV 85.9  --  87.8  --  85.7 88.4  PLT 323  --  206  --  325 191     Basic Metabolic Panel: Recent Labs  Lab 02/07/21 0918 02/07/21 1052 02/07/21 1849 02/08/21 0334 02/08/21 1309 02/08/21 1554 02/09/21 0355 02/10/21 1422  NA  --    < > 139 141 143  --  140 140  K  --    < > 2.3* 3.5 4.4  --  4.0 3.8  CL  --   --  111 114* 113*  --  109 107  CO2  --   --  18* 24  26  --  27 27  GLUCOSE  --   --  280* 102* 82  --  84 97  BUN  --   --  7 7 7   --  <5* <5*  CREATININE 0.62  --  0.80 0.67 0.54  --  0.57 0.61  CALCIUM  --   --  8.5* 8.4* 7.9*  --  8.3* 9.0  MG 1.6*  --  1.6* 2.2 2.0 1.9 1.9 1.8  PHOS 1.7*  --  1.1* <1.0* 3.4 2.7  --   --    < > =  values in this interval not displayed.     GFR: Estimated Creatinine Clearance: 102.1 mL/min (by C-G formula based on SCr of 0.61 mg/dL).  Liver Function Tests: Recent Labs  Lab 02/07/21 0200  AST 25  ALT 19  ALKPHOS 58  BILITOT 1.1  PROT 7.0  ALBUMIN 4.1    No results for input(s): LIPASE, AMYLASE in the last 168 hours. No results for input(s): AMMONIA in the last 168 hours.  Coagulation Profile: No results for input(s): INR, PROTIME in the last 168 hours.  Cardiac Enzymes: Recent Labs  Lab 02/07/21 2200  CKTOTAL 177     BNP (last 3 results) No results for input(s): PROBNP in the last 8760 hours.  Lipid Profile: No results for input(s): CHOL, HDL, LDLCALC, TRIG, CHOLHDL, LDLDIRECT in the last 72 hours.  Thyroid Function Tests: No results for input(s): TSH, T4TOTAL, FREET4, T3FREE, THYROIDAB in the last 72 hours.  Anemia Panel: No results for input(s): VITAMINB12, FOLATE, FERRITIN, TIBC, IRON, RETICCTPCT in the last 72 hours.  Urine analysis:    Component Value Date/Time   COLORURINE YELLOW 02/07/2021 0201   APPEARANCEUR CLEAR 02/07/2021 0201   LABSPEC 1.025 02/07/2021 0201   PHURINE 6.0 02/07/2021 0201   GLUCOSEU NEGATIVE 02/07/2021 0201   HGBUR NEGATIVE 02/07/2021 0201   BILIRUBINUR NEGATIVE 02/07/2021 0201   KETONESUR NEGATIVE 02/07/2021 0201   PROTEINUR NEGATIVE 02/07/2021 0201   UROBILINOGEN 0.2 06/14/2013 1513   NITRITE NEGATIVE 02/07/2021 0201   LEUKOCYTESUR NEGATIVE 02/07/2021 0201    Sepsis Labs: Lactic Acid, Venous    Component Value Date/Time   LATICACIDVEN 1.4 03/31/2019 1027    MICROBIOLOGY: Recent Results (from the past 240 hour(s))  Resp Panel by  RT-PCR (Flu A&B, Covid) Nasopharyngeal Swab     Status: None   Collection Time: 02/07/21  2:02 AM   Specimen: Nasopharyngeal Swab; Nasopharyngeal(NP) swabs in vial transport medium  Result Value Ref Range Status   SARS Coronavirus 2 by RT PCR NEGATIVE NEGATIVE Final    Comment: (NOTE) SARS-CoV-2 target nucleic acids are NOT DETECTED.  The SARS-CoV-2 RNA is generally detectable in upper respiratory specimens during the acute phase of infection. The lowest concentration of SARS-CoV-2 viral copies this assay can detect is 138 copies/mL. A negative result does not preclude SARS-Cov-2 infection and should not be used as the sole basis for treatment or other patient management decisions. A negative result may occur with  improper specimen collection/handling, submission of specimen other than nasopharyngeal swab, presence of viral mutation(s) within the areas targeted by this assay, and inadequate number of viral copies(<138 copies/mL). A negative result must be combined with clinical observations, patient history, and epidemiological information. The expected result is Negative.  Fact Sheet for Patients:  EntrepreneurPulse.com.au  Fact Sheet for Healthcare Providers:  IncredibleEmployment.be  This test is no t yet approved or cleared by the [place] FDA and  has been authorized for detection and/or diagnosis of SARS-CoV-2 by FDA under an Emergency Use Authorization (EUA). This EUA will remain  in effect (meaning this test can be used) for the duration of the COVID-19 declaration under Section 564(b)(1) of the Act, 21 U.S.C.section 360bbb-3(b)(1), unless the authorization is terminated  or revoked sooner.       Influenza A by PCR NEGATIVE NEGATIVE Final   Influenza B by PCR NEGATIVE NEGATIVE Final    Comment: (NOTE) The Xpert Xpress SARS-CoV-2/FLU/RSV plus assay is intended as an aid in the diagnosis of influenza from Nasopharyngeal swab  specimens and should not be  used as a sole basis for treatment. Nasal washings and aspirates are unacceptable for Xpert Xpress SARS-CoV-2/FLU/RSV testing.  Fact Sheet for Patients: EntrepreneurPulse.com.au  Fact Sheet for Healthcare Providers: IncredibleEmployment.be  This test is not yet approved or cleared by the [place] FDA and has been authorized for detection and/or diagnosis of SARS-CoV-2 by FDA under an Emergency Use Authorization (EUA). This EUA will remain in effect (meaning this test can be used) for the duration of the COVID-19 declaration under Section 564(b)(1) of the Act, 21 U.S.C. section 360bbb-3(b)(1), unless the authorization is terminated or revoked.  Performed at [facility], [place] [address] [place], [place] [address]   C Difficile Quick Screen w PCR reflex     Status: None   Collection Time: 02/07/21 10:44 AM   Specimen: STOOL  Result Value Ref Range Status   C Diff antigen NEGATIVE NEGATIVE Final   C Diff toxin NEGATIVE NEGATIVE Final   C Diff interpretation No C. difficile detected.  Final    Comment: Performed at [facility], [place] [address], [place], [place] [address]  Culture, blood (routine x 2)     Status: None   Collection Time: 02/07/21 11:00 AM   Specimen: BLOOD RIGHT ARM  Result Value Ref Range Status   Specimen Description BLOOD RIGHT ARM  Final   Special Requests   Final    BOTTLES DRAWN AEROBIC AND ANAEROBIC Blood Culture adequate volume   Culture   Final    NO GROWTH 5 DAYS Performed at [facility], [address] [place], [place] [address]    Report Status 02/12/2021 FINAL  Final  Culture, blood (routine x 2)     Status: None   Collection Time: 02/07/21 11:20 AM   Specimen: BLOOD  Result Value Ref Range Status   Specimen Description BLOOD SITE NOT SPECIFIED  Final   Special Requests   Final    BOTTLES DRAWN AEROBIC AND ANAEROBIC Blood Culture results may not be optimal  due to an excessive volume of blood received in culture bottles   Culture   Final    NO GROWTH 5 DAYS Performed at [facility], [place] [address], [place], [place] [address]    Report Status 02/12/2021 FINAL  Final  MRSA Next Gen by PCR, Nasal     Status: Abnormal   Collection Time: 02/07/21  4:51 PM   Specimen: Nasal Mucosa; Nasal Swab  Result Value Ref Range Status   MRSA by PCR Next Gen DETECTED (A) NOT DETECTED Final    Comment: RESULT CALLED TO, READ BACK BY AND VERIFIED WITH: RN A.WALTON ON [id number] AT 1945 BY E.PARRISH (NOTE) The GeneXpert MRSA Assay (FDA approved for NASAL specimens only), is one component of a comprehensive MRSA colonization surveillance program. It is not intended to diagnose MRSA infection nor to guide or monitor treatment for MRSA infections. Test performance is not FDA approved in patients less than 59 years old. Performed at [facility], [place] [address], [place], [place] [address]     RADIOLOGY STUDIES/RESULTS: No results found.   LOS: 6 days   Oren Binet, MD  [facility]    To contact the attending provider between 7A-7P or the covering provider during after hours 7P-7A, please log into the web site [email or web address] and access using universal [place] password for that web site. If you do not have the password, please call the hospital operator.  02/13/2021, 11:12 AM

## 2021-02-14 NOTE — Plan of Care (Signed)
°  Problem: Safety: Goal: Non-violent Restraint(s) Outcome: Progressing   Problem: Education: Goal: Knowledge of General Education information will improve Description: Including pain rating scale, medication(s)/side effects and non-pharmacologic comfort measures Outcome: Progressing   Problem: Health Behavior/Discharge Planning: Goal: Ability to manage health-related needs will improve Outcome: Progressing   Problem: Clinical Measurements: Goal: Ability to maintain clinical measurements within normal limits will improve Outcome: Progressing Goal: Will remain free from infection Outcome: Progressing Goal: Diagnostic test results will improve Outcome: Progressing Goal: Respiratory complications will improve Outcome: Progressing Goal: Cardiovascular complication will be avoided Outcome: Progressing   Problem: Activity: Goal: Risk for activity intolerance will decrease Outcome: Progressing   Problem: Nutrition: Goal: Adequate nutrition will be maintained Outcome: Progressing   Problem: Coping: Goal: Level of anxiety will decrease Outcome: Progressing   Problem: Elimination: Goal: Will not experience complications related to bowel motility Outcome: Progressing   Problem: Pain Managment: Goal: General experience of comfort will improve Outcome: Progressing   Problem: Safety: Goal: Ability to remain free from injury will improve Outcome: Progressing   Problem: Skin Integrity: Goal: Risk for impaired skin integrity will decrease Outcome: Progressing

## 2021-02-14 NOTE — Progress Notes (Signed)
PROGRESS NOTE        PATIENT DETAILS Name: Carmen Daniels Age: 35 y.o. Sex: female Date of Birth: 1985-09-07 Admit Date: 02/07/2021 Admitting Physician Julian Hy, DO PCP:Sun, Mikeal Hawthorne, MD  Brief Narrative: Patient is a 35 y.o. female with history of bipolar disorder, OCD, depression, fibromyalgia, chronic pain syndrome-who presented with a suicidal attempt-EMS found multiple empty bottles of Ambien, Klonopin and sumatriptan.  Patient was very drowsy on initial presentation-however her mental status worsened-she became obtunded with agonal respiration-PCCM was consulted-patient was intubated and subsequently admitted to the ICU.  She was stabilized-extubated on 12/18-and subsequently transferred to the [facility] service.  She has been evaluated by psychiatry with recommendations for inpatient psychiatric admission.  Subjective: Sleeping comfortably when I walked in-no major issues overnight-stable neuropathic pain.  Awaiting inpatient psychiatry bed.  Sitter at [employer] encouraged patient to ambulate multiple times a day.  Objective: Vitals: Blood pressure 110/71, pulse 72, temperature 98.1 F (36.7 C), temperature source Oral, resp. rate 19, height 5\' 11"  (1.803 m), weight 66.4 kg, SpO2 98 %.   Exam: Gen Exam:Alert awake-not in any distress HEENT:atraumatic, normocephalic Chest: B/L clear to auscultation anteriorly CVS:S1S2 regular Abdomen:soft non tender, non distended Extremities:no edema Neurology: Non focal Skin: no rash   Pertinent Labs/Radiology: Recent Labs  Lab 02/09/21 0355 02/10/21 1422  WBC 11.4*  --   HGB 10.2*  --   PLT 191  --   NA 140 140  K 4.0 3.8  CREATININE 0.57 0.61     Assessment/Plan: Acute toxic encephalopathy due to intentional overdose of Ambien/Klonopin/Imitrex-with suicidal intent: Encephalopathy has resolved-psychiatry following-recommendations are for inpatient psychiatric admission.  Bedside sitter in place.  Awaiting  inpatient psychiatry bed-remains medically stable for discharge.  Acute hypercarbic respiratory failure in the setting of toxic encephalopathy: Respiratory failure and encephalopathy has resolved.  Doing well on room air.  Extubated on 12/18.    Drug-induced hypotension/bradycardia: Resolved  PNA: Afebrile-no leukocytosis-completed a course of antimicrobial therapy.  Chronic pain syndrome/fibromyalgia/peripheral neuropathy: Appears comfortable-continue Neurontin/Subutex-just adjusted on 12/22.  Per patient-she takes Subutex approximately 4-5 times a day.  History of bipolar disorder/schizophrenia/anxiety disorder/OCD: Psychiatry following-we will defer further care to psychiatry.  BMI: Estimated body mass index is 20.42 kg/m as calculated from the following:   Height as of this encounter: 5\' 11"  (1.803 m).   Weight as of this encounter: 66.4 kg.    Procedures: None Consults: PCCM, psychiatry DVT Prophylaxis: Heparin Code Status:Full code Family Communication: None at bedside  Time spent: 15- minutes-Greater than 50% of this time was spent in counseling, explanation of diagnosis, planning of further management, and coordination of care.  Disposition Plan: Status is: Inpatient  Remains inpatient appropriate because: Awaiting inpatient psychiatry bed.   Diet: Diet Order             Diet regular Room service appropriate? No; Fluid consistency: Thin  Diet effective now                     Antimicrobial agents: Anti-infectives (From admission, onward)    Start     Dose/Rate Route Frequency Ordered Stop   02/10/21 1000  levofloxacin (LEVAQUIN) tablet 500 mg  Status:  Discontinued        500 mg Oral Daily 02/09/21 1503 02/11/21 1010   02/07/21 2300  vancomycin (VANCOREADY) IVPB 1250 mg/250 mL  Status:  Discontinued        1,250 mg 166.7 mL/hr over 90 Minutes Intravenous Every 12 hours 02/07/21 0931 02/09/21 1503   02/07/21 1800  aztreonam (AZACTAM) 1 g in sodium  chloride 0.9 % 100 mL IVPB  Status:  Discontinued        1 g 200 mL/hr over 30 Minutes Intravenous Every 8 hours 02/07/21 0934 02/09/21 1503   02/07/21 0945  vancomycin (VANCOREADY) IVPB 1500 mg/300 mL        1,500 mg 150 mL/hr over 120 Minutes Intravenous  Once 02/07/21 0931 02/07/21 1252   02/07/21 0945  aztreonam (AZACTAM) 2 g in sodium chloride 0.9 % 100 mL IVPB        2 g 200 mL/hr over 30 Minutes Intravenous  Once 02/07/21 0934 02/07/21 1341        MEDICATIONS: Scheduled Meds:  buprenorphine  4 mg Sublingual TID   Chlorhexidine Gluconate Cloth  6 each Topical Daily   clonazePAM  1 mg Oral BID   gabapentin  400 mg Oral TID   heparin  5,000 Units Subcutaneous Q8H   mouth rinse  15 mL Mouth Rinse BID   nicotine  14 mg Transdermal Q0600   polyethylene glycol  17 g Oral Daily   sodium chloride flush  10-40 mL Intracatheter Q12H   Continuous Infusions:  sodium chloride Stopped (02/08/21 0737)   PRN Meds:.acetaminophen, docusate sodium, ondansetron (ZOFRAN) IV, polyethylene glycol, sodium chloride flush, SUMAtriptan   I have personally reviewed following labs and imaging studies  LABORATORY DATA: CBC: Recent Labs  Lab 02/08/21 0334 02/09/21 0355  WBC 11.6* 11.4*  HGB 11.0* 10.2*  HCT 32.3* 30.6*  MCV 85.7 88.4  PLT 325 191     Basic Metabolic Panel: Recent Labs  Lab 02/07/21 1849 02/08/21 0334 02/08/21 1309 02/08/21 1554 02/09/21 0355 02/10/21 1422  NA 139 141 143  --  140 140  K 2.3* 3.5 4.4  --  4.0 3.8  CL 111 114* 113*  --  109 107  CO2 18* 24 26  --  27 27  GLUCOSE 280* 102* 82  --  84 97  BUN 7 7 7   --  <5* <5*  CREATININE 0.80 0.67 0.54  --  0.57 0.61  CALCIUM 8.5* 8.4* 7.9*  --  8.3* 9.0  MG 1.6* 2.2 2.0 1.9 1.9 1.8  PHOS 1.1* <1.0* 3.4 2.7  --   --      GFR: Estimated Creatinine Clearance: 101.9 mL/min (by C-G formula based on SCr of 0.61 mg/dL).  Liver Function Tests: No results for input(s): AST, ALT, ALKPHOS, BILITOT, PROT, ALBUMIN  in the last 168 hours.  No results for input(s): LIPASE, AMYLASE in the last 168 hours. No results for input(s): AMMONIA in the last 168 hours.  Coagulation Profile: No results for input(s): INR, PROTIME in the last 168 hours.  Cardiac Enzymes: Recent Labs  Lab 02/07/21 2200  CKTOTAL 177     BNP (last 3 results) No results for input(s): PROBNP in the last 8760 hours.  Lipid Profile: No results for input(s): CHOL, HDL, LDLCALC, TRIG, CHOLHDL, LDLDIRECT in the last 72 hours.  Thyroid Function Tests: No results for input(s): TSH, T4TOTAL, FREET4, T3FREE, THYROIDAB in the last 72 hours.  Anemia Panel: No results for input(s): VITAMINB12, FOLATE, FERRITIN, TIBC, IRON, RETICCTPCT in the last 72 hours.  Urine analysis:    Component Value Date/Time   COLORURINE YELLOW 02/07/2021 0201   APPEARANCEUR CLEAR 02/07/2021 0201   LABSPEC 1.025  02/07/2021 0201   PHURINE 6.0 02/07/2021 0201   GLUCOSEU NEGATIVE 02/07/2021 0201   HGBUR NEGATIVE 02/07/2021 0201   BILIRUBINUR NEGATIVE 02/07/2021 0201   KETONESUR NEGATIVE 02/07/2021 0201   PROTEINUR NEGATIVE 02/07/2021 0201   UROBILINOGEN 0.2 06/14/2013 1513   NITRITE NEGATIVE 02/07/2021 0201   LEUKOCYTESUR NEGATIVE 02/07/2021 0201    Sepsis Labs: Lactic Acid, Venous    Component Value Date/Time   LATICACIDVEN 1.4 03/31/2019 1027    MICROBIOLOGY: Recent Results (from the past 240 hour(s))  Resp Panel by RT-PCR (Flu A&B, Covid) Nasopharyngeal Swab     Status: None   Collection Time: 02/07/21  2:02 AM   Specimen: Nasopharyngeal Swab; Nasopharyngeal(NP) swabs in vial transport medium  Result Value Ref Range Status   SARS Coronavirus 2 by RT PCR NEGATIVE NEGATIVE Final    Comment: (NOTE) SARS-CoV-2 target nucleic acids are NOT DETECTED.  The SARS-CoV-2 RNA is generally detectable in upper respiratory specimens during the acute phase of infection. The lowest concentration of SARS-CoV-2 viral copies this assay can detect is 138  copies/mL. A negative result does not preclude SARS-Cov-2 infection and should not be used as the sole basis for treatment or other patient management decisions. A negative result may occur with  improper specimen collection/handling, submission of specimen other than nasopharyngeal swab, presence of viral mutation(s) within the areas targeted by this assay, and inadequate number of viral copies(<138 copies/mL). A negative result must be combined with clinical observations, patient history, and epidemiological information. The expected result is Negative.  Fact Sheet for Patients:  EntrepreneurPulse.com.au  Fact Sheet for Healthcare Providers:  IncredibleEmployment.be  This test is no t yet approved or cleared by the [place] FDA and  has been authorized for detection and/or diagnosis of SARS-CoV-2 by FDA under an Emergency Use Authorization (EUA). This EUA will remain  in effect (meaning this test can be used) for the duration of the COVID-19 declaration under Section 564(b)(1) of the Act, 21 U.S.C.section 360bbb-3(b)(1), unless the authorization is terminated  or revoked sooner.       Influenza A by PCR NEGATIVE NEGATIVE Final   Influenza B by PCR NEGATIVE NEGATIVE Final    Comment: (NOTE) The Xpert Xpress SARS-CoV-2/FLU/RSV plus assay is intended as an aid in the diagnosis of influenza from Nasopharyngeal swab specimens and should not be used as a sole basis for treatment. Nasal washings and aspirates are unacceptable for Xpert Xpress SARS-CoV-2/FLU/RSV testing.  Fact Sheet for Patients: EntrepreneurPulse.com.au  Fact Sheet for Healthcare Providers: IncredibleEmployment.be  This test is not yet approved or cleared by the [place] FDA and has been authorized for detection and/or diagnosis of SARS-CoV-2 by FDA under an Emergency Use Authorization (EUA). This EUA will remain in effect (meaning  this test can be used) for the duration of the COVID-19 declaration under Section 564(b)(1) of the Act, 21 U.S.C. section 360bbb-3(b)(1), unless the authorization is terminated or revoked.  Performed at [facility], [place] [address], [place], [place] [address]   C Difficile Quick Screen w PCR reflex     Status: None   Collection Time: 02/07/21 10:44 AM   Specimen: STOOL  Result Value Ref Range Status   C Diff antigen NEGATIVE NEGATIVE Final   C Diff toxin NEGATIVE NEGATIVE Final   C Diff interpretation No C. difficile detected.  Final    Comment: Performed at [facility], [place] [address] [place], [place] [address]  Culture, blood (routine x 2)     Status:  None   Collection Time: 02/07/21 11:00 AM   Specimen: BLOOD RIGHT ARM  Result Value Ref Range Status   Specimen Description BLOOD RIGHT ARM  Final   Special Requests   Final    BOTTLES DRAWN AEROBIC AND ANAEROBIC Blood Culture adequate volume   Culture   Final    NO GROWTH 5 DAYS Performed at [facility], [address] [place], [place] [address]    Report Status 02/12/2021 FINAL  Final  Culture, blood (routine x 2)     Status: None   Collection Time: 02/07/21 11:20 AM   Specimen: BLOOD  Result Value Ref Range Status   Specimen Description BLOOD SITE NOT SPECIFIED  Final   Special Requests   Final    BOTTLES DRAWN AEROBIC AND ANAEROBIC Blood Culture results may not be optimal due to an excessive volume of blood received in culture bottles   Culture   Final    NO GROWTH 5 DAYS Performed at [facility], [place] [address], [place], [place] [address]    Report Status 02/12/2021 FINAL  Final  MRSA Next Gen by PCR, Nasal     Status: Abnormal   Collection Time: 02/07/21  4:51 PM   Specimen: Nasal Mucosa; Nasal Swab  Result Value Ref Range Status   MRSA by PCR Next Gen DETECTED (A) NOT DETECTED Final    Comment: RESULT CALLED TO, READ BACK BY AND VERIFIED WITH: RN A.WALTON ON [id number] AT 1945 BY  E.PARRISH (NOTE) The GeneXpert MRSA Assay (FDA approved for NASAL specimens only), is one component of a comprehensive MRSA colonization surveillance program. It is not intended to diagnose MRSA infection nor to guide or monitor treatment for MRSA infections. Test performance is not FDA approved in patients less than 36 years old. Performed at [facility], [place] [address] [place], [place] [address]     RADIOLOGY STUDIES/RESULTS: No results found.   LOS: 7 days   Oren Binet, MD  [facility]    To contact the attending provider between 7A-7P or the covering provider during after hours 7P-7A, please log into the web site [email or web address] and access using universal [place] password for that web site. If you do not have the password, please call the hospital operator.  02/14/2021, 11:38 AM

## 2021-02-15 NOTE — Progress Notes (Signed)
PROGRESS NOTE        PATIENT DETAILS Name: Carmen Daniels Age: 35 y.o. Sex: female Date of Birth: [birth year] Admit Date: 02/07/2021 Admitting Physician Julian Hy, DO PCP:Sun, Mikeal Hawthorne, MD  Brief Narrative: Patient is a 35 y.o. female with history of bipolar disorder, OCD, depression, fibromyalgia, chronic pain syndrome-who presented with a suicidal attempt-EMS found multiple empty bottles of Ambien, Klonopin and sumatriptan.  Patient was very drowsy on initial presentation-however her mental status worsened-she became obtunded with agonal respiration-PCCM was consulted-patient was intubated and subsequently admitted to the ICU.  She was stabilized-extubated on 12/18-and subsequently transferred to the [facility] service.  She has been evaluated by psychiatry with recommendations for inpatient psychiatric admission.  Subjective: Sleeping comfortably-continues to have chronic neuropathic pain.  Objective: Vitals: Blood pressure (!) 102/57, pulse 69, temperature 98.4 F (36.9 C), temperature source Oral, resp. rate 14, height 5\' 11"  (1.803 m), weight 68.3 kg, SpO2 97 %.   Exam: Gen Exam:Alert awake-not in any distress HEENT:atraumatic, normocephalic Chest: B/L clear to auscultation anteriorly CVS:S1S2 regular Abdomen:soft non tender, non distended Extremities:no edema Neurology: Non focal Skin: no rash   Pertinent Labs/Radiology: Recent Labs  Lab 02/09/21 0355 02/10/21 1422  WBC 11.4*  --   HGB 10.2*  --   PLT 191  --   NA 140 140  K 4.0 3.8  CREATININE 0.57 0.61     Assessment/Plan: Acute toxic encephalopathy due to intentional overdose of Ambien/Klonopin/Imitrex-with suicidal intent: Encephalopathy has resolved-psychiatry following-recommendations are for inpatient psychiatric admission.  Bedside sitter in place.  Awaiting inpatient psychiatry bed-remains medically stable for discharge.  Acute hypercarbic respiratory failure in the setting of toxic  encephalopathy: Respiratory failure and encephalopathy has resolved.  Doing well on room air.  Extubated on 12/18.    Drug-induced hypotension/bradycardia: Resolved-had another episode of mild sinus bradycardia while sleeping-check TSH.  She is completely asymptomatic-doubt further work-up is required.  PNA: Afebrile-no leukocytosis-completed a course of antimicrobial therapy.  Chronic pain syndrome/fibromyalgia/peripheral neuropathy: Appears comfortable-continue Neurontin/Subutex-just adjusted on 12/22.  Per patient-she takes Subutex approximately 4-5 times a day.  History of bipolar disorder/schizophrenia/anxiety disorder/OCD: Psychiatry following-we will defer further care to psychiatry.  BMI: Estimated body mass index is 21 kg/m as calculated from the following:   Height as of this encounter: 5\' 11"  (1.803 m).   Weight as of this encounter: 68.3 kg.    Procedures: None Consults: PCCM, psychiatry DVT Prophylaxis: Heparin Code Status:Full code Family Communication: None at bedside  Time spent: 15- minutes-Greater than 50% of this time was spent in counseling, explanation of diagnosis, planning of further management, and coordination of care.  Disposition Plan: Status is: Inpatient  Remains inpatient appropriate because: Awaiting inpatient psychiatry bed.   Diet: Diet Order             Diet vegetarian Room service appropriate? Yes; Fluid consistency: Thin  Diet effective now                     Antimicrobial agents: Anti-infectives (From admission, onward)    Start     Dose/Rate Route Frequency Ordered Stop   02/10/21 1000  levofloxacin (LEVAQUIN) tablet 500 mg  Status:  Discontinued        500 mg Oral Daily 02/09/21 1503 02/11/21 1010   02/07/21 2300  vancomycin (VANCOREADY) IVPB 1250 mg/250 mL  Status:  Discontinued  1,250 mg 166.7 mL/hr over 90 Minutes Intravenous Every 12 hours 02/07/21 0931 02/09/21 1503   02/07/21 1800  aztreonam (AZACTAM) 1 g in  sodium chloride 0.9 % 100 mL IVPB  Status:  Discontinued        1 g 200 mL/hr over 30 Minutes Intravenous Every 8 hours 02/07/21 0934 02/09/21 1503   02/07/21 0945  vancomycin (VANCOREADY) IVPB 1500 mg/300 mL        1,500 mg 150 mL/hr over 120 Minutes Intravenous  Once 02/07/21 0931 02/07/21 1252   02/07/21 0945  aztreonam (AZACTAM) 2 g in sodium chloride 0.9 % 100 mL IVPB        2 g 200 mL/hr over 30 Minutes Intravenous  Once 02/07/21 0934 02/07/21 1341        MEDICATIONS: Scheduled Meds:  buprenorphine  4 mg Sublingual TID   Chlorhexidine Gluconate Cloth  6 each Topical Daily   clonazePAM  1 mg Oral BID   gabapentin  400 mg Oral TID   heparin  5,000 Units Subcutaneous Q8H   mouth rinse  15 mL Mouth Rinse BID   nicotine  14 mg Transdermal Q0600   polyethylene glycol  17 g Oral Daily   sodium chloride flush  10-40 mL Intracatheter Q12H   Continuous Infusions:  sodium chloride Stopped (02/08/21 0737)   PRN Meds:.acetaminophen, docusate sodium, ondansetron (ZOFRAN) IV, polyethylene glycol, sodium chloride flush   I have personally reviewed following labs and imaging studies  LABORATORY DATA: CBC: Recent Labs  Lab 02/09/21 0355  WBC 11.4*  HGB 10.2*  HCT 30.6*  MCV 88.4  PLT 191     Basic Metabolic Panel: Recent Labs  Lab 02/08/21 1309 02/08/21 1554 02/09/21 0355 02/10/21 1422  NA 143  --  140 140  K 4.4  --  4.0 3.8  CL 113*  --  109 107  CO2 26  --  27 27  GLUCOSE 82  --  84 97  BUN 7  --  <5* <5*  CREATININE 0.54  --  0.57 0.61  CALCIUM 7.9*  --  8.3* 9.0  MG 2.0 1.9 1.9 1.8  PHOS 3.4 2.7  --   --      GFR: Estimated Creatinine Clearance: 104.8 mL/min (by C-G formula based on SCr of 0.61 mg/dL).  Liver Function Tests: No results for input(s): AST, ALT, ALKPHOS, BILITOT, PROT, ALBUMIN in the last 168 hours.  No results for input(s): LIPASE, AMYLASE in the last 168 hours. No results for input(s): AMMONIA in the last 168 hours.  Coagulation  Profile: No results for input(s): INR, PROTIME in the last 168 hours.  Cardiac Enzymes: No results for input(s): CKTOTAL, CKMB, CKMBINDEX, TROPONINI in the last 168 hours.   BNP (last 3 results) No results for input(s): PROBNP in the last 8760 hours.  Lipid Profile: No results for input(s): CHOL, HDL, LDLCALC, TRIG, CHOLHDL, LDLDIRECT in the last 72 hours.  Thyroid Function Tests: No results for input(s): TSH, T4TOTAL, FREET4, T3FREE, THYROIDAB in the last 72 hours.  Anemia Panel: No results for input(s): VITAMINB12, FOLATE, FERRITIN, TIBC, IRON, RETICCTPCT in the last 72 hours.  Urine analysis:    Component Value Date/Time   COLORURINE YELLOW 02/07/2021 0201   APPEARANCEUR CLEAR 02/07/2021 0201   LABSPEC 1.025 02/07/2021 0201   PHURINE 6.0 02/07/2021 0201   GLUCOSEU NEGATIVE 02/07/2021 0201   HGBUR NEGATIVE 02/07/2021 0201   BILIRUBINUR NEGATIVE 02/07/2021 0201   KETONESUR NEGATIVE 02/07/2021 0201   PROTEINUR NEGATIVE 02/07/2021 0201  UROBILINOGEN 0.2 06/14/2013 1513   NITRITE NEGATIVE 02/07/2021 0201   LEUKOCYTESUR NEGATIVE 02/07/2021 0201    Sepsis Labs: Lactic Acid, Venous    Component Value Date/Time   LATICACIDVEN 1.4 03/31/2019 1027    MICROBIOLOGY: Recent Results (from the past 240 hour(s))  Resp Panel by RT-PCR (Flu A&B, Covid) Nasopharyngeal Swab     Status: None   Collection Time: 02/07/21  2:02 AM   Specimen: Nasopharyngeal Swab; Nasopharyngeal(NP) swabs in vial transport medium  Result Value Ref Range Status   SARS Coronavirus 2 by RT PCR NEGATIVE NEGATIVE Final    Comment: (NOTE) SARS-CoV-2 target nucleic acids are NOT DETECTED.  The SARS-CoV-2 RNA is generally detectable in upper respiratory specimens during the acute phase of infection. The lowest concentration of SARS-CoV-2 viral copies this assay can detect is 138 copies/mL. A negative result does not preclude SARS-Cov-2 infection and should not be used as the sole basis for treatment  or other patient management decisions. A negative result may occur with  improper specimen collection/handling, submission of specimen other than nasopharyngeal swab, presence of viral mutation(s) within the areas targeted by this assay, and inadequate number of viral copies(<138 copies/mL). A negative result must be combined with clinical observations, patient history, and epidemiological information. The expected result is Negative.  Fact Sheet for Patients:  EntrepreneurPulse.com.au  Fact Sheet for Healthcare Providers:  IncredibleEmployment.be  This test is no t yet approved or cleared by the [place] FDA and  has been authorized for detection and/or diagnosis of SARS-CoV-2 by FDA under an Emergency Use Authorization (EUA). This EUA will remain  in effect (meaning this test can be used) for the duration of the COVID-19 declaration under Section 564(b)(1) of the Act, 21 U.S.C.section 360bbb-3(b)(1), unless the authorization is terminated  or revoked sooner.       Influenza A by PCR NEGATIVE NEGATIVE Final   Influenza B by PCR NEGATIVE NEGATIVE Final    Comment: (NOTE) The Xpert Xpress SARS-CoV-2/FLU/RSV plus assay is intended as an aid in the diagnosis of influenza from Nasopharyngeal swab specimens and should not be used as a sole basis for treatment. Nasal washings and aspirates are unacceptable for Xpert Xpress SARS-CoV-2/FLU/RSV testing.  Fact Sheet for Patients: EntrepreneurPulse.com.au  Fact Sheet for Healthcare Providers: IncredibleEmployment.be  This test is not yet approved or cleared by the [place] FDA and has been authorized for detection and/or diagnosis of SARS-CoV-2 by FDA under an Emergency Use Authorization (EUA). This EUA will remain in effect (meaning this test can be used) for the duration of the COVID-19 declaration under Section 564(b)(1) of the Act, 21 U.S.C. section  360bbb-3(b)(1), unless the authorization is terminated or revoked.  Performed at [facility], [place] [address] [place], [place] [address]   C Difficile Quick Screen w PCR reflex     Status: None   Collection Time: 02/07/21 10:44 AM   Specimen: STOOL  Result Value Ref Range Status   C Diff antigen NEGATIVE NEGATIVE Final   C Diff toxin NEGATIVE NEGATIVE Final   C Diff interpretation No C. difficile detected.  Final    Comment: Performed at [facility], [place] [address] [place], [place] [address]  Culture, blood (routine x 2)     Status: None   Collection Time: 02/07/21 11:00 AM   Specimen: BLOOD RIGHT ARM  Result Value Ref Range Status   Specimen Description BLOOD RIGHT ARM  Final   Special Requests   Final    BOTTLES  DRAWN AEROBIC AND ANAEROBIC Blood Culture adequate volume   Culture   Final    NO GROWTH 5 DAYS Performed at [facility], [place] [address] [place], [place] [address]    Report Status 02/12/2021 FINAL  Final  Culture, blood (routine x 2)     Status: None   Collection Time: 02/07/21 11:20 AM   Specimen: BLOOD  Result Value Ref Range Status   Specimen Description BLOOD SITE NOT SPECIFIED  Final   Special Requests   Final    BOTTLES DRAWN AEROBIC AND ANAEROBIC Blood Culture results may not be optimal due to an excessive volume of blood received in culture bottles   Culture   Final    NO GROWTH 5 DAYS Performed at [facility], [place] [address] [place], [place] [address]    Report Status 02/12/2021 FINAL  Final  MRSA Next Gen by PCR, Nasal     Status: Abnormal   Collection Time: 02/07/21  4:51 PM   Specimen: Nasal Mucosa; Nasal Swab  Result Value Ref Range Status   MRSA by PCR Next Gen DETECTED (A) NOT DETECTED Final    Comment: RESULT CALLED TO, READ BACK BY AND VERIFIED WITH: RN A.WALTON ON [id number] AT 1945 BY E.PARRISH (NOTE) The GeneXpert MRSA Assay (FDA approved for NASAL specimens only), is one component of a comprehensive MRSA  colonization surveillance program. It is not intended to diagnose MRSA infection nor to guide or monitor treatment for MRSA infections. Test performance is not FDA approved in patients less than 73 years old. Performed at [facility], [place] [address], [place], [place] [address]     RADIOLOGY STUDIES/RESULTS: No results found.   LOS: 8 days   Oren Binet, MD  [facility]    To contact the attending provider between 7A-7P or the covering provider during after hours 7P-7A, please log into the web site [email or web address] and access using universal [place] password for that web site. If you do not have the password, please call the hospital operator.  02/15/2021, 12:31 PM

## 2021-02-15 NOTE — Progress Notes (Signed)
SECOND CALL FROM TELE REGARDING LOW HEART RATE 39-40 PT DID GET BACK TO BED AFTER GOING TO BATHROOM AND HER HEART RATE DROPPED AGAIN. ASYMPTOMATIC. CHARGE NURSE AWARE

## 2021-02-15 NOTE — Progress Notes (Signed)
HEART RATE 39 SINUS BRADYCARDIA , REMAINS ASYMPTOMATIC MD NOTIFIED AND EKG ORDERED

## 2021-02-15 NOTE — Progress Notes (Signed)
Md notified ekg resulted with sinus bradycardia with marked sinus arrhythmia rate 48

## 2021-02-15 NOTE — Progress Notes (Signed)
TELE CALLED NURSE PT HEART RATE 40 SINUS BRADYCARDIA, NURSE NOTIFIED MD AND NO NEW ORDERS. BP GOOD TOLERATES LOW HEART RATE AND ASYMPTOMATIC. PT HAD TAKEN IMATREX FOR A MIGRAINE 6/10 AND NOW IS BETTER 3/10. WHEN OOB TO BATHROOM HEART RATE 65-80

## 2021-02-16 LAB — TSH: TSH: 0.972 u[IU]/mL (ref 0.350–4.500)

## 2021-02-16 NOTE — Progress Notes (Signed)
PT Cancellation/Discharge Note  Patient Details Name: Carmen Daniels MRN: [id number] DOB: 10/21/1985   Cancelled Treatment:    Reason Eval/Treat Not Completed: PT screened, no needs identified, will sign off  Patient reports she is walking to bathroom with RW (which she used PTA). She reports she can do extra walking when she goes to bathroom and does not need PT to assist her to walk. Nurse tech/sitter in room and states she will remind her to do extra walking when she does get up. RN notified of no further PT needs. PT is signing off with no follow-up needs indicated.    Arby Barrette, PT Acute Rehabilitation Services  Pager [phone] Office [phone]    Rexanne Mano 02/16/2021, 10:03 AM

## 2021-02-16 NOTE — Progress Notes (Signed)
PROGRESS NOTE        PATIENT DETAILS Name: Carmen Daniels Age: 35 y.o. Sex: female Date of Birth: 24-Nov-1985 Admit Date: 02/07/2021 Admitting Physician Julian Hy, DO PCP:Sun, Mikeal Hawthorne, MD  Brief Narrative: Patient is a 35 y.o. female with history of bipolar disorder, OCD, depression, fibromyalgia, chronic pain syndrome-who presented with a suicidal attempt-EMS found multiple empty bottles of Ambien, Klonopin and sumatriptan.  Patient was very drowsy on initial presentation-however her mental status worsened-she became obtunded with agonal respiration-PCCM was consulted-patient was intubated and subsequently admitted to the ICU.  She was stabilized-extubated on 12/18-and subsequently transferred to the [facility] service.  She has been evaluated by psychiatry with recommendations for inpatient psychiatric admission.  Subjective: Sleeping comfortably-no major issues overnight.  Have encouraged patient to ambulate with bedside sitter.  Awaiting inpatient psychiatric bed.  Objective: Vitals: Blood pressure (!) 96/57, pulse (!) 58, temperature 97.8 F (36.6 C), temperature source Oral, resp. rate 15, height 5\' 11"  (1.803 m), weight 68.6 kg, SpO2 97 %.   Exam: Gen Exam:Alert awake-not in any distress HEENT:atraumatic, normocephalic Chest: B/L clear to auscultation anteriorly CVS:S1S2 regular Abdomen:soft non tender, non distended Extremities:no edema Neurology: Non focal Skin: no rash   Pertinent Labs/Radiology: Recent Labs  Lab 02/10/21 1422  NA 140  K 3.8  CREATININE 0.61     Assessment/Plan: Acute toxic encephalopathy due to intentional overdose of Ambien/Klonopin/Imitrex-with suicidal intent: Encephalopathy has resolved-psychiatry following-recommendations are for inpatient psychiatric admission.  Bedside sitter in place.  Awaiting inpatient psychiatry bed-remains medically stable for discharge.  Acute hypercarbic respiratory failure in the setting of toxic  encephalopathy: Respiratory failure and encephalopathy has resolved.  Doing well on room air.  Extubated on 12/18.    Drug-induced hypotension/bradycardia: Resolved-had another episode of mild sinus bradycardia while sleeping-check TSH.  She is completely asymptomatic-doubt further work-up is required.  PNA: Afebrile-no leukocytosis-completed a course of antimicrobial therapy.  Chronic pain syndrome/fibromyalgia/peripheral neuropathy: Appears comfortable-continue Neurontin/Subutex-just adjusted on 12/22.  Per patient-she takes Subutex approximately 4-5 times a day.  History of bipolar disorder/schizophrenia/anxiety disorder/OCD: Psychiatry following-we will defer further care to psychiatry.  BMI: Estimated body mass index is 21.09 kg/m as calculated from the following:   Height as of this encounter: 5\' 11"  (1.803 m).   Weight as of this encounter: 68.6 kg.    Procedures: None Consults: PCCM, psychiatry DVT Prophylaxis: Heparin Code Status:Full code Family Communication: None at bedside  Time spent: 15- minutes-Greater than 50% of this time was spent in counseling, explanation of diagnosis, planning of further management, and coordination of care.  Disposition Plan: Status is: Inpatient  Remains inpatient appropriate because: Awaiting inpatient psychiatry bed.   Diet: Diet Order             Diet vegetarian Room service appropriate? Yes; Fluid consistency: Thin  Diet effective now                     Antimicrobial agents: Anti-infectives (From admission, onward)    Start     Dose/Rate Route Frequency Ordered Stop   02/10/21 1000  levofloxacin (LEVAQUIN) tablet 500 mg  Status:  Discontinued        500 mg Oral Daily 02/09/21 1503 02/11/21 1010   02/07/21 2300  vancomycin (VANCOREADY) IVPB 1250 mg/250 mL  Status:  Discontinued        1,250 mg 166.7  mL/hr over 90 Minutes Intravenous Every 12 hours 02/07/21 0931 02/09/21 1503   02/07/21 1800  aztreonam (AZACTAM) 1 g in  sodium chloride 0.9 % 100 mL IVPB  Status:  Discontinued        1 g 200 mL/hr over 30 Minutes Intravenous Every 8 hours 02/07/21 0934 02/09/21 1503   02/07/21 0945  vancomycin (VANCOREADY) IVPB 1500 mg/300 mL        1,500 mg 150 mL/hr over 120 Minutes Intravenous  Once 02/07/21 0931 02/07/21 1252   02/07/21 0945  aztreonam (AZACTAM) 2 g in sodium chloride 0.9 % 100 mL IVPB        2 g 200 mL/hr over 30 Minutes Intravenous  Once 02/07/21 0934 02/07/21 1341        MEDICATIONS: Scheduled Meds:  buprenorphine  4 mg Sublingual TID   Chlorhexidine Gluconate Cloth  6 each Topical Daily   clonazePAM  1 mg Oral BID   gabapentin  400 mg Oral TID   heparin  5,000 Units Subcutaneous Q8H   mouth rinse  15 mL Mouth Rinse BID   nicotine  14 mg Transdermal Q0600   polyethylene glycol  17 g Oral Daily   sodium chloride flush  10-40 mL Intracatheter Q12H   Continuous Infusions:  sodium chloride Stopped (02/08/21 0737)   PRN Meds:.acetaminophen, docusate sodium, ondansetron (ZOFRAN) IV, polyethylene glycol, sodium chloride flush   I have personally reviewed following labs and imaging studies  LABORATORY DATA: CBC: No results for input(s): WBC, NEUTROABS, HGB, HCT, MCV, PLT in the last 168 hours.   Basic Metabolic Panel: Recent Labs  Lab 02/10/21 1422  NA 140  K 3.8  CL 107  CO2 27  GLUCOSE 97  BUN <5*  CREATININE 0.61  CALCIUM 9.0  MG 1.8     GFR: Estimated Creatinine Clearance: 105.3 mL/min (by C-G formula based on SCr of 0.61 mg/dL).  Liver Function Tests: No results for input(s): AST, ALT, ALKPHOS, BILITOT, PROT, ALBUMIN in the last 168 hours.  No results for input(s): LIPASE, AMYLASE in the last 168 hours. No results for input(s): AMMONIA in the last 168 hours.  Coagulation Profile: No results for input(s): INR, PROTIME in the last 168 hours.  Cardiac Enzymes: No results for input(s): CKTOTAL, CKMB, CKMBINDEX, TROPONINI in the last 168 hours.   BNP (last 3  results) No results for input(s): PROBNP in the last 8760 hours.  Lipid Profile: No results for input(s): CHOL, HDL, LDLCALC, TRIG, CHOLHDL, LDLDIRECT in the last 72 hours.  Thyroid Function Tests: No results for input(s): TSH, T4TOTAL, FREET4, T3FREE, THYROIDAB in the last 72 hours.  Anemia Panel: No results for input(s): VITAMINB12, FOLATE, FERRITIN, TIBC, IRON, RETICCTPCT in the last 72 hours.  Urine analysis:    Component Value Date/Time   COLORURINE YELLOW 02/07/2021 0201   APPEARANCEUR CLEAR 02/07/2021 0201   LABSPEC 1.025 02/07/2021 0201   PHURINE 6.0 02/07/2021 0201   GLUCOSEU NEGATIVE 02/07/2021 0201   HGBUR NEGATIVE 02/07/2021 0201   BILIRUBINUR NEGATIVE 02/07/2021 0201   KETONESUR NEGATIVE 02/07/2021 0201   PROTEINUR NEGATIVE 02/07/2021 0201   UROBILINOGEN 0.2 06/14/2013 1513   NITRITE NEGATIVE 02/07/2021 0201   LEUKOCYTESUR NEGATIVE 02/07/2021 0201    Sepsis Labs: Lactic Acid, Venous    Component Value Date/Time   LATICACIDVEN 1.4 03/31/2019 1027    MICROBIOLOGY: Recent Results (from the past 240 hour(s))  Resp Panel by RT-PCR (Flu A&B, Covid) Nasopharyngeal Swab     Status: None   Collection Time: 02/07/21  2:02 AM   Specimen: Nasopharyngeal Swab; Nasopharyngeal(NP) swabs in vial transport medium  Result Value Ref Range Status   SARS Coronavirus 2 by RT PCR NEGATIVE NEGATIVE Final    Comment: (NOTE) SARS-CoV-2 target nucleic acids are NOT DETECTED.  The SARS-CoV-2 RNA is generally detectable in upper respiratory specimens during the acute phase of infection. The lowest concentration of SARS-CoV-2 viral copies this assay can detect is 138 copies/mL. A negative result does not preclude SARS-Cov-2 infection and should not be used as the sole basis for treatment or other patient management decisions. A negative result may occur with  improper specimen collection/handling, submission of specimen other than nasopharyngeal swab, presence of viral mutation(s)  within the areas targeted by this assay, and inadequate number of viral copies(<138 copies/mL). A negative result must be combined with clinical observations, patient history, and epidemiological information. The expected result is Negative.  Fact Sheet for Patients:  EntrepreneurPulse.com.au  Fact Sheet for Healthcare Providers:  IncredibleEmployment.be  This test is no t yet approved or cleared by the [place] FDA and  has been authorized for detection and/or diagnosis of SARS-CoV-2 by FDA under an Emergency Use Authorization (EUA). This EUA will remain  in effect (meaning this test can be used) for the duration of the COVID-19 declaration under Section 564(b)(1) of the Act, 21 U.S.C.section 360bbb-3(b)(1), unless the authorization is terminated  or revoked sooner.       Influenza A by PCR NEGATIVE NEGATIVE Final   Influenza B by PCR NEGATIVE NEGATIVE Final    Comment: (NOTE) The Xpert Xpress SARS-CoV-2/FLU/RSV plus assay is intended as an aid in the diagnosis of influenza from Nasopharyngeal swab specimens and should not be used as a sole basis for treatment. Nasal washings and aspirates are unacceptable for Xpert Xpress SARS-CoV-2/FLU/RSV testing.  Fact Sheet for Patients: EntrepreneurPulse.com.au  Fact Sheet for Healthcare Providers: IncredibleEmployment.be  This test is not yet approved or cleared by the [place] FDA and has been authorized for detection and/or diagnosis of SARS-CoV-2 by FDA under an Emergency Use Authorization (EUA). This EUA will remain in effect (meaning this test can be used) for the duration of the COVID-19 declaration under Section 564(b)(1) of the Act, 21 U.S.C. section 360bbb-3(b)(1), unless the authorization is terminated or revoked.  Performed at [facility], [place] [address] [place], [place] [address]   C Difficile Quick Screen w PCR reflex      Status: None   Collection Time: 02/07/21 10:44 AM   Specimen: STOOL  Result Value Ref Range Status   C Diff antigen NEGATIVE NEGATIVE Final   C Diff toxin NEGATIVE NEGATIVE Final   C Diff interpretation No C. difficile detected.  Final    Comment: Performed at [facility], [place] [address], [place], [place] [address]  Culture, blood (routine x 2)     Status: None   Collection Time: 02/07/21 11:00 AM   Specimen: BLOOD RIGHT ARM  Result Value Ref Range Status   Specimen Description BLOOD RIGHT ARM  Final   Special Requests   Final    BOTTLES DRAWN AEROBIC AND ANAEROBIC Blood Culture adequate volume   Culture   Final    NO GROWTH 5 DAYS Performed at [facility], [address], [place], [place] [address]    Report Status 02/12/2021 FINAL  Final  Culture, blood (routine x 2)     Status: None   Collection Time: 02/07/21 11:20 AM   Specimen: BLOOD  Result Value  Ref Range Status   Specimen Description BLOOD SITE NOT SPECIFIED  Final   Special Requests   Final    BOTTLES DRAWN AEROBIC AND ANAEROBIC Blood Culture results may not be optimal due to an excessive volume of blood received in culture bottles   Culture   Final    NO GROWTH 5 DAYS Performed at [facility], [place] [address], [place], [place] [address]    Report Status 02/12/2021 FINAL  Final  MRSA Next Gen by PCR, Nasal     Status: Abnormal   Collection Time: 02/07/21  4:51 PM   Specimen: Nasal Mucosa; Nasal Swab  Result Value Ref Range Status   MRSA by PCR Next Gen DETECTED (A) NOT DETECTED Final    Comment: RESULT CALLED TO, READ BACK BY AND VERIFIED WITH: RN A.WALTON ON [id number] AT 1945 BY E.PARRISH (NOTE) The GeneXpert MRSA Assay (FDA approved for NASAL specimens only), is one component of a comprehensive MRSA colonization surveillance program. It is not intended to diagnose MRSA infection nor to guide or monitor treatment for MRSA infections. Test performance is not FDA approved in patients less than 53  years old. Performed at [facility], [place] [address], [place], [place] [address]     RADIOLOGY STUDIES/RESULTS: No results found.   LOS: 9 days   Oren Binet, MD  [facility]    To contact the attending provider between 7A-7P or the covering provider during after hours 7P-7A, please log into the web site [email or web address] and access using universal [place] password for that web site. If you do not have the password, please call the hospital operator.  02/16/2021, 11:24 AM

## 2021-02-16 NOTE — Progress Notes (Signed)
Pt asking about whereabouts of belongings - specifically cell phone and 2 necklaces.  Writer spoke with security office who reports per their log, cell phone and 2 necklaces are locked in security safe for pt. Pt notified of this.   Pt also asking if they have a black backpack with roses on it that says "Justice". RN called security again and they do not have backpack with that description. Pt says she will check with her family.

## 2021-02-17 ENCOUNTER — Ambulatory Visit: Payer: Medicaid Other | Admitting: Neurology

## 2021-02-17 NOTE — Progress Notes (Signed)
PROGRESS NOTE        PATIENT DETAILS Name: Carmen Daniels Age: 35 y.o. Sex: female Date of Birth: 1985/11/11 Admit Date: 02/07/2021 Admitting Physician Julian Hy, DO PCP:Sun, Mikeal Hawthorne, MD  Brief Narrative: Patient is a 35 y.o. female with history of bipolar disorder, OCD, depression, fibromyalgia, chronic pain syndrome-who presented with a suicidal attempt-EMS found multiple empty bottles of Ambien, Klonopin and sumatriptan.  Patient was very drowsy on initial presentation-however her mental status worsened-she became obtunded with agonal respiration-PCCM was consulted-patient was intubated and subsequently admitted to the ICU.  She was stabilized-extubated on 12/18-and subsequently transferred to the [facility] service.  She has been evaluated by psychiatry with recommendations for inpatient psychiatric admission.  Subjective: Sleeping comfortably-no chest pain or shortness of breath.  Awaiting inpatient psychiatry bed.  Objective: Vitals: Blood pressure (!) 99/55, pulse 75, temperature 97.9 F (36.6 C), temperature source Oral, resp. rate 15, height 5\' 11"  (1.803 m), weight 68.7 kg, SpO2 99 %.   Exam: Gen Exam:Alert awake-not in any distress HEENT:atraumatic, normocephalic Chest: B/L clear to auscultation anteriorly CVS:S1S2 regular Abdomen:soft non tender, non distended Extremities:no edema Neurology: Non focal Skin: no rash   Pertinent Labs/Radiology: Recent Labs  Lab 02/10/21 1422  NA 140  K 3.8  CREATININE 0.61     Assessment/Plan: Acute toxic encephalopathy due to intentional overdose of Ambien/Klonopin/Imitrex-with suicidal intent: Encephalopathy has resolved-psychiatry following-recommendations are for inpatient psychiatric admission.  Bedside sitter in place.  Awaiting inpatient psychiatry bed-remains medically stable for discharge.  Acute hypercarbic respiratory failure in the setting of toxic encephalopathy: Respiratory failure and  encephalopathy has resolved.  Doing well on room air.  Extubated on 12/18.    Drug-induced hypotension/bradycardia: Resolved-had another episode of mild sinus bradycardia while sleeping-check TSH.  She is completely asymptomatic-doubt further work-up is required.  PNA: Afebrile-no leukocytosis-completed a course of antimicrobial therapy.  Chronic pain syndrome/fibromyalgia/peripheral neuropathy: Appears comfortable-continue Neurontin/Subutex-just adjusted on 12/22.  Per patient-she takes Subutex approximately 4-5 times a day.  History of bipolar disorder/schizophrenia/anxiety disorder/OCD: Psychiatry following-we will defer further care to psychiatry.  BMI: Estimated body mass index is 21.12 kg/m as calculated from the following:   Height as of this encounter: 5\' 11"  (1.803 m).   Weight as of this encounter: 68.7 kg.    Procedures: None Consults: PCCM, psychiatry DVT Prophylaxis: Heparin Code Status:Full code Family Communication: None at bedside  Time spent: 15- minutes-Greater than 50% of this time was spent in counseling, explanation of diagnosis, planning of further management, and coordination of care.  Disposition Plan: Status is: Inpatient  Remains inpatient appropriate because: Awaiting inpatient psychiatry bed.   Diet: Diet Order             Diet vegetarian Room service appropriate? Yes; Fluid consistency: Thin  Diet effective now                     Antimicrobial agents: Anti-infectives (From admission, onward)    Start     Dose/Rate Route Frequency Ordered Stop   02/10/21 1000  levofloxacin (LEVAQUIN) tablet 500 mg  Status:  Discontinued        500 mg Oral Daily 02/09/21 1503 02/11/21 1010   02/07/21 2300  vancomycin (VANCOREADY) IVPB 1250 mg/250 mL  Status:  Discontinued        1,250 mg 166.7 mL/hr over 90 Minutes Intravenous Every 12  hours 02/07/21 0931 02/09/21 1503   02/07/21 1800  aztreonam (AZACTAM) 1 g in sodium chloride 0.9 % 100 mL IVPB   Status:  Discontinued        1 g 200 mL/hr over 30 Minutes Intravenous Every 8 hours 02/07/21 0934 02/09/21 1503   02/07/21 0945  vancomycin (VANCOREADY) IVPB 1500 mg/300 mL        1,500 mg 150 mL/hr over 120 Minutes Intravenous  Once 02/07/21 0931 02/07/21 1252   02/07/21 0945  aztreonam (AZACTAM) 2 g in sodium chloride 0.9 % 100 mL IVPB        2 g 200 mL/hr over 30 Minutes Intravenous  Once 02/07/21 0934 02/07/21 1341        MEDICATIONS: Scheduled Meds:  buprenorphine  4 mg Sublingual TID   Chlorhexidine Gluconate Cloth  6 each Topical Daily   clonazePAM  1 mg Oral BID   gabapentin  400 mg Oral TID   heparin  5,000 Units Subcutaneous Q8H   mouth rinse  15 mL Mouth Rinse BID   nicotine  14 mg Transdermal Q0600   polyethylene glycol  17 g Oral Daily   sodium chloride flush  10-40 mL Intracatheter Q12H   Continuous Infusions:  sodium chloride Stopped (02/08/21 0737)   PRN Meds:.acetaminophen, docusate sodium, ondansetron (ZOFRAN) IV, polyethylene glycol, sodium chloride flush   I have personally reviewed following labs and imaging studies  LABORATORY DATA: CBC: No results for input(s): WBC, NEUTROABS, HGB, HCT, MCV, PLT in the last 168 hours.   Basic Metabolic Panel: Recent Labs  Lab 02/10/21 1422  NA 140  K 3.8  CL 107  CO2 27  GLUCOSE 97  BUN <5*  CREATININE 0.61  CALCIUM 9.0  MG 1.8     GFR: Estimated Creatinine Clearance: 105.4 mL/min (by C-G formula based on SCr of 0.61 mg/dL).  Liver Function Tests: No results for input(s): AST, ALT, ALKPHOS, BILITOT, PROT, ALBUMIN in the last 168 hours.  No results for input(s): LIPASE, AMYLASE in the last 168 hours. No results for input(s): AMMONIA in the last 168 hours.  Coagulation Profile: No results for input(s): INR, PROTIME in the last 168 hours.  Cardiac Enzymes: No results for input(s): CKTOTAL, CKMB, CKMBINDEX, TROPONINI in the last 168 hours.   BNP (last 3 results) No results for input(s):  PROBNP in the last 8760 hours.  Lipid Profile: No results for input(s): CHOL, HDL, LDLCALC, TRIG, CHOLHDL, LDLDIRECT in the last 72 hours.  Thyroid Function Tests: Recent Labs    02/15/21 1052  TSH 0.972    Anemia Panel: No results for input(s): VITAMINB12, FOLATE, FERRITIN, TIBC, IRON, RETICCTPCT in the last 72 hours.  Urine analysis:    Component Value Date/Time   COLORURINE YELLOW 02/07/2021 0201   APPEARANCEUR CLEAR 02/07/2021 0201   LABSPEC 1.025 02/07/2021 0201   PHURINE 6.0 02/07/2021 0201   GLUCOSEU NEGATIVE 02/07/2021 0201   HGBUR NEGATIVE 02/07/2021 0201   BILIRUBINUR NEGATIVE 02/07/2021 0201   KETONESUR NEGATIVE 02/07/2021 0201   PROTEINUR NEGATIVE 02/07/2021 0201   UROBILINOGEN 0.2 06/14/2013 1513   NITRITE NEGATIVE 02/07/2021 0201   LEUKOCYTESUR NEGATIVE 02/07/2021 0201    Sepsis Labs: Lactic Acid, Venous    Component Value Date/Time   LATICACIDVEN 1.4 03/31/2019 1027    MICROBIOLOGY: Recent Results (from the past 240 hour(s))  Culture, blood (routine x 2)     Status: None   Collection Time: 02/07/21 11:20 AM   Specimen: BLOOD  Result Value Ref Range Status  Specimen Description BLOOD SITE NOT SPECIFIED  Final   Special Requests   Final    BOTTLES DRAWN AEROBIC AND ANAEROBIC Blood Culture results may not be optimal due to an excessive volume of blood received in culture bottles   Culture   Final    NO GROWTH 5 DAYS Performed at [facility], [place] [address] [place], [place] [address]    Report Status 02/12/2021 FINAL  Final  MRSA Next Gen by PCR, Nasal     Status: Abnormal   Collection Time: 02/07/21  4:51 PM   Specimen: Nasal Mucosa; Nasal Swab  Result Value Ref Range Status   MRSA by PCR Next Gen DETECTED (A) NOT DETECTED Final    Comment: RESULT CALLED TO, READ BACK BY AND VERIFIED WITH: RN A.WALTON ON [id number] AT 1945 BY E.PARRISH (NOTE) The GeneXpert MRSA Assay (FDA approved for NASAL specimens only), is one component of a  comprehensive MRSA colonization surveillance program. It is not intended to diagnose MRSA infection nor to guide or monitor treatment for MRSA infections. Test performance is not FDA approved in patients less than 25 years old. Performed at [facility], [place] [address] [place], [place] [address]     RADIOLOGY STUDIES/RESULTS: No results found.   LOS: 10 days   Oren Binet, MD  [facility]    To contact the attending provider between 7A-7P or the covering provider during after hours 7P-7A, please log into the web site [email or web address] and access using universal [place] password for that web site. If you do not have the password, please call the hospital operator.  02/17/2021, 11:08 AM

## 2021-02-17 NOTE — Plan of Care (Signed)
  Problem: Education: Goal: Knowledge of General Education information will improve Description Including pain rating scale, medication(s)/side effects and non-pharmacologic comfort measures Outcome: Progressing   Problem: Health Behavior/Discharge Planning: Goal: Ability to manage health-related needs will improve Outcome: Progressing   

## 2021-02-18 ENCOUNTER — Ambulatory Visit: Payer: Medicaid Other | Admitting: Neurology

## 2021-02-18 LAB — RESP PANEL BY RT-PCR (FLU A&B, COVID) ARPGX2
Influenza A by PCR: NEGATIVE
Influenza B by PCR: NEGATIVE
SARS Coronavirus 2 by RT PCR: NEGATIVE

## 2021-02-18 MED ORDER — BUPRENORPHINE HCL 2 MG SL SUBL
4.0000 mg | SUBLINGUAL_TABLET | Freq: Three times a day (TID) | SUBLINGUAL | Status: DC
  Administered 2021-02-18 – 2021-02-19 (×5): 4 mg via SUBLINGUAL
  Filled 2021-02-18 (×5): qty 2

## 2021-02-18 NOTE — Progress Notes (Signed)
PROGRESS NOTE        PATIENT DETAILS Name: Carmen Daniels Age: 35 y.o. Sex: female Date of Birth: 10/16/1985 Admit Date: 02/07/2021 Admitting Physician Julian Hy, DO PCP:Sun, Mikeal Hawthorne, MD  Brief Narrative: Patient is a 35 y.o. female with history of bipolar disorder, OCD, depression, fibromyalgia, chronic pain syndrome-who presented with a suicidal attempt-EMS found multiple empty bottles of Ambien, Klonopin and sumatriptan.  Patient was very drowsy on initial presentation-however her mental status worsened-she became obtunded with agonal respiration-PCCM was consulted-patient was intubated and subsequently admitted to the ICU.  She was stabilized-extubated on 12/18-and subsequently transferred to the [facility] service.  She has been evaluated by psychiatry with recommendations for inpatient psychiatric admission.  Subjective: Lying comfortably in bed-asking her Subutex be changed to 4 times daily dosing.  Objective: Vitals: Blood pressure (!) 88/56, pulse 68, temperature 97.8 F (36.6 C), resp. rate 20, height 5\' 11"  (1.803 m), weight 68.7 kg, SpO2 96 %.   Exam: Gen Exam:Alert awake-not in any distress HEENT:atraumatic, normocephalic Chest: B/L clear to auscultation anteriorly CVS:S1S2 regular Abdomen:soft non tender, non distended Extremities:no edema Neurology: Non focal Skin: no rash   Pertinent Labs/Radiology: No results for input(s): WBC, HGB, PLT, NA, K, CREATININE, AST, ALT, ALKPHOS, BILITOT in the last 168 hours.   Assessment/Plan: Acute toxic encephalopathy due to intentional overdose of Ambien/Klonopin/Imitrex-with suicidal intent: Encephalopathy has resolved-psychiatry following-recommendations are for inpatient psychiatric admission.  Bedside sitter in place.  Awaiting inpatient psychiatry bed-remains medically stable for discharge.  Acute hypercarbic respiratory failure in the setting of toxic encephalopathy: Respiratory failure and  encephalopathy has resolved.  Doing well on room air.  Extubated on 12/18.    Drug-induced hypotension/bradycardia: Resolved-had another episode of mild sinus bradycardia while sleeping-check TSH.  She is completely asymptomatic-doubt further work-up is required.  PNA: Afebrile-no leukocytosis-completed a course of antimicrobial therapy.  Chronic pain syndrome/fibromyalgia/peripheral neuropathy: Appears comfortable-continue Neurontin/Subutex-just adjusted on 12/22.  Per patient-she takes Subutex approximately 4-5 times a day.  History of bipolar disorder/schizophrenia/anxiety disorder/OCD: Psychiatry following-we will defer further care to psychiatry.  BMI: Estimated body mass index is 21.12 kg/m as calculated from the following:   Height as of this encounter: 5\' 11"  (1.803 m).   Weight as of this encounter: 68.7 kg.    Procedures: None Consults: PCCM, psychiatry DVT Prophylaxis: Heparin Code Status:Full code Family Communication: None at bedside  Time spent: 15- minutes-Greater than 50% of this time was spent in counseling, explanation of diagnosis, planning of further management, and coordination of care.  Disposition Plan: Status is: Inpatient  Remains inpatient appropriate because: Awaiting inpatient psychiatry bed.   Diet: Diet Order             Diet vegetarian Room service appropriate? Yes; Fluid consistency: Thin  Diet effective now                     Antimicrobial agents: Anti-infectives (From admission, onward)    Start     Dose/Rate Route Frequency Ordered Stop   02/10/21 1000  levofloxacin (LEVAQUIN) tablet 500 mg  Status:  Discontinued        500 mg Oral Daily 02/09/21 1503 02/11/21 1010   02/07/21 2300  vancomycin (VANCOREADY) IVPB 1250 mg/250 mL  Status:  Discontinued        1,250 mg 166.7 mL/hr over 90 Minutes Intravenous Every 12 hours  02/07/21 0931 02/09/21 1503   02/07/21 1800  aztreonam (AZACTAM) 1 g in sodium chloride 0.9 % 100 mL IVPB   Status:  Discontinued        1 g 200 mL/hr over 30 Minutes Intravenous Every 8 hours 02/07/21 0934 02/09/21 1503   02/07/21 0945  vancomycin (VANCOREADY) IVPB 1500 mg/300 mL        1,500 mg 150 mL/hr over 120 Minutes Intravenous  Once 02/07/21 0931 02/07/21 1252   02/07/21 0945  aztreonam (AZACTAM) 2 g in sodium chloride 0.9 % 100 mL IVPB        2 g 200 mL/hr over 30 Minutes Intravenous  Once 02/07/21 0934 02/07/21 1341        MEDICATIONS: Scheduled Meds:  buprenorphine  4 mg Sublingual TID PC & HS   Chlorhexidine Gluconate Cloth  6 each Topical Daily   clonazePAM  1 mg Oral BID   gabapentin  400 mg Oral TID   heparin  5,000 Units Subcutaneous Q8H   mouth rinse  15 mL Mouth Rinse BID   nicotine  14 mg Transdermal Q0600   polyethylene glycol  17 g Oral Daily   sodium chloride flush  10-40 mL Intracatheter Q12H   Continuous Infusions:  sodium chloride Stopped (02/08/21 0737)   PRN Meds:.acetaminophen, docusate sodium, ondansetron (ZOFRAN) IV, polyethylene glycol, sodium chloride flush   I have personally reviewed following labs and imaging studies  LABORATORY DATA: CBC: No results for input(s): WBC, NEUTROABS, HGB, HCT, MCV, PLT in the last 168 hours.   Basic Metabolic Panel: No results for input(s): NA, K, CL, CO2, GLUCOSE, BUN, CREATININE, CALCIUM, MG, PHOS in the last 168 hours.   GFR: Estimated Creatinine Clearance: 105.4 mL/min (by C-G formula based on SCr of 0.61 mg/dL).  Liver Function Tests: No results for input(s): AST, ALT, ALKPHOS, BILITOT, PROT, ALBUMIN in the last 168 hours.  No results for input(s): LIPASE, AMYLASE in the last 168 hours. No results for input(s): AMMONIA in the last 168 hours.  Coagulation Profile: No results for input(s): INR, PROTIME in the last 168 hours.  Cardiac Enzymes: No results for input(s): CKTOTAL, CKMB, CKMBINDEX, TROPONINI in the last 168 hours.   BNP (last 3 results) No results for input(s): PROBNP in the last 8760  hours.  Lipid Profile: No results for input(s): CHOL, HDL, LDLCALC, TRIG, CHOLHDL, LDLDIRECT in the last 72 hours.  Thyroid Function Tests: No results for input(s): TSH, T4TOTAL, FREET4, T3FREE, THYROIDAB in the last 72 hours.   Anemia Panel: No results for input(s): VITAMINB12, FOLATE, FERRITIN, TIBC, IRON, RETICCTPCT in the last 72 hours.  Urine analysis:    Component Value Date/Time   COLORURINE YELLOW 02/07/2021 0201   APPEARANCEUR CLEAR 02/07/2021 0201   LABSPEC 1.025 02/07/2021 0201   PHURINE 6.0 02/07/2021 0201   GLUCOSEU NEGATIVE 02/07/2021 0201   HGBUR NEGATIVE 02/07/2021 0201   BILIRUBINUR NEGATIVE 02/07/2021 0201   KETONESUR NEGATIVE 02/07/2021 0201   PROTEINUR NEGATIVE 02/07/2021 0201   UROBILINOGEN 0.2 06/14/2013 1513   NITRITE NEGATIVE 02/07/2021 0201   LEUKOCYTESUR NEGATIVE 02/07/2021 0201    Sepsis Labs: Lactic Acid, Venous    Component Value Date/Time   LATICACIDVEN 1.4 03/31/2019 1027    MICROBIOLOGY: No results found for this or any previous visit (from the past 240 hour(s)).   RADIOLOGY STUDIES/RESULTS: No results found.   LOS: 11 days   Oren Binet, MD  [facility]    To contact the attending provider between 7A-7P or the covering provider  during after hours 7P-7A, please log into the web site [email or web address] and access using universal [place] password for that web site. If you do not have the password, please call the hospital operator.  02/18/2021, 11:59 AM

## 2021-02-18 NOTE — Progress Notes (Addendum)
CSW renewing IVC. Magistrate received paperwork. CSW contacted [employer] to serve patient and placed paperwork on hard chart.   CSW faxed referral to [place] and [place].   Gilmore Laroche, MSW, [facility]

## 2021-02-18 NOTE — TOC Progression Note (Signed)
Transition of Care [facility]) - Progression Note    Patient Details  Name: Carmen Daniels MRN: [id number] Date of Birth: [birth year]  Transition of Care [facility]) CM/SW [place], LCSW Phone Number: 02/18/2021, 6:19 PM  Clinical Narrative:    CSW received call from [facility]. They spoke with patient's nurse and can accept patient tomorrow.  Accepting MD: Dr. Janese Banks Building, [place] Report#: [phone]  RN requested COVID test.  CSW will fax COVID test results, DC Summary, and IVC to [facility] at discharge tomorrow and arrange [place] transport.    Expected Discharge Plan: [facility]    Expected Discharge Plan and Services Expected Discharge Plan: [place] arrangements for the past 2 months: Single Family Home                                       Social Determinants of Health (SDOH) Interventions    Readmission Risk Interventions No flowsheet data found.

## 2021-02-18 NOTE — Progress Notes (Signed)
OT Cancellation Note  Patient Details Name: Carmen Daniels MRN: [id number] DOB: March 07, 1985   Cancelled Treatment:    Reason Eval/Treat Not Completed: Patient declined, no reason specified (Patient stated not today and to try again tomorrow.) Lodema Hong, [place]  Pager [phone] Office [place] 02/18/2021, 2:28 PM

## 2021-02-19 NOTE — Plan of Care (Signed)
°  Problem: Education: Goal: Knowledge of General Education information will improve Description: Including pain rating scale, medication(s)/side effects and non-pharmacologic comfort measures Outcome: Progressing   Problem: Health Behavior/Discharge Planning: Goal: Ability to manage health-related needs will improve Outcome: Progressing   Problem: Clinical Measurements: Goal: Ability to maintain clinical measurements within normal limits will improve Outcome: Progressing Goal: Will remain free from infection Outcome: Progressing Goal: Diagnostic test results will improve Outcome: Progressing Goal: Respiratory complications will improve Outcome: Progressing Goal: Cardiovascular complication will be avoided Outcome: Progressing   Problem: Activity: Goal: Risk for activity intolerance will decrease Outcome: Progressing   Problem: Nutrition: Goal: Adequate nutrition will be maintained Outcome: Progressing   Problem: Coping: Goal: Level of anxiety will decrease Outcome: Progressing   Problem: Elimination: Goal: Will not experience complications related to bowel motility Outcome: Progressing   Problem: Pain Managment: Goal: General experience of comfort will improve Outcome: Progressing   Problem: Safety: Goal: Ability to remain free from injury will improve Outcome: Progressing   Problem: Skin Integrity: Goal: Risk for impaired skin integrity will decrease Outcome: Progressing   Problem: Education: Goal: Knowledge of warning signs, risks, and behaviors that relate to suicide ideation and self-harm behaviors will improve Outcome: Progressing   Problem: Health Behavior/Discharge (Transition) Planning: Goal: Ability to manage health-related needs will improve Outcome: Progressing   Problem: Clinical Measurements: Goal: Remain free from any harm during hospitalization Outcome: Progressing   Problem: Nutrition: Goal: Adequate fluids and nutrition will be  maintained Outcome: Progressing   Problem: Coping: Goal: Ability to disclose and discuss thoughts of suicide and self-harm will improve Outcome: Progressing   Problem: Medication Management: Goal: Adhere to prescribed medication regimen Outcome: Progressing   Problem: Sleep Hygiene: Goal: Ability to obtain adequate restful sleep will improve Outcome: Progressing   Problem: Self Esteem: Goal: Ability to verbalize positive feeling about self will improve Outcome: Progressing

## 2021-02-19 NOTE — Progress Notes (Incomplete)
RN at bedside. Patient appears drowsy and lethargic. Patient does not complain of pain at this time. IV connections removed; telemonitoring connections removed. All questions by patient answered by RN. Agricultural consultant notified.

## 2021-02-19 NOTE — Progress Notes (Signed)
OT Cancellation Note  Patient Details Name: Carmen Daniels MRN: [id number] DOB: [birth year]   Cancelled Treatment:    Reason Eval/Treat Not Completed: Patient declined, no reason specified (Patient states that she did not want to participate due to headache) Lodema Hong, [place]  Pager [phone] Office [place] 02/19/2021, 12:05 PM

## 2021-02-19 NOTE — TOC Transition Note (Addendum)
Transition of Care [facility]) - CM/SW Discharge Note   Patient Details  Name: Carmen Daniels MRN: [id number] Date of Birth: [birth year]  [facility] of [facility]) CM/SW Contact:  Benard Halsted, LCSW Phone Number: 02/19/2021, 10:46 AM   Clinical Narrative:    Patient will DC to: [facility] Anticipated DC date: 02/19/21 Family notified: Spouse and mother at request of pt Transport by: Brandon Melnick  Patient's mother to bring patient's walker and appropriate clothing to hospital prior to transport.   Per MD patient ready for DC to [place]. RN to call report prior to discharge ([phone] ). RN, patient, patient's family, and facility notified of DC. Discharge Summary COVID test, and IVC sent to facility. IVC packet on chart. Transport requested for patient.   CSW will sign off for now as social work intervention is no longer needed. Please consult [place] again if new needs arise.     Final next level of care: Psychiatric Hospital Barriers to Discharge: Barriers Resolved   Patient Goals and CMS Choice Patient states their goals for this hospitalization and ongoing recovery are:: Feel better      Discharge Placement                Patient to be transferred to facility by: [facility] Name of family member notified: Spouse Patient and family notified of of transfer: 02/19/21  Discharge Plan and Services                                     Social Determinants of Health (SDOH) Interventions     Readmission Risk Interventions No flowsheet data found.

## 2021-02-19 NOTE — Discharge Summary (Signed)
PATIENT DETAILS Name: Carmen Daniels Age: 35 y.o. Sex: female Date of Birth: 03-27-1985 MRN: [id number]. Admitting Physician: Julian Hy, DO PCP:Sun, Mikeal Hawthorne, MD  Admit Date: 02/07/2021 Discharge date: 02/19/2021  Recommendations for Outpatient Follow-up:  Follow up with PCP in 1-2 weeks   Admitted From:  Home  Disposition: Inpatient psychiatry.   Home Health: No  Equipment/Devices: None  Discharge Condition: Stable  CODE STATUS: FULL CODE  Diet recommendation:  Diet Order             Diet general           Diet vegetarian Room service appropriate? Yes; Fluid consistency: Thin  Diet effective now                    Brief Summary: Patient is a 35 y.o. female with history of bipolar disorder, OCD, depression, fibromyalgia, chronic pain syndrome-who presented with a suicidal attempt-EMS found multiple empty bottles of Ambien, Klonopin and sumatriptan.  Patient was very drowsy on initial presentation-however her mental status worsened-she became obtunded with agonal respiration-PCCM was consulted-patient was intubated and subsequently admitted to the ICU.  She was stabilized-extubated on 12/18-and subsequently transferred to the [facility] service.  She has been evaluated by psychiatry with recommendations for inpatient psychiatric admission.  Brief Hospital Course: Acute toxic encephalopathy due to intentional overdose of Ambien/Klonopin/Imitrex-with suicidal intent: Encephalopathy has resolved-psychiatry following-recommendations are for inpatient psychiatric admission.  Bedside sitter in place.  Awaiting inpatient psychiatry bed-remains medically stable for discharge.   Acute hypercarbic respiratory failure in the setting of toxic encephalopathy: Respiratory failure and encephalopathy has resolved.  Doing well on room air.  Extubated on 12/18.     Drug-induced hypotension/bradycardia: Resolved-had another episode of mild sinus bradycardia while sleeping-TSH stable.   Palpation-heart rate is always on the lower side when she sleeps and rests-she has good chronotropic response-and rebound really quickly with ambulation.  Stable for continued monitoring in the outpatient setting.   PNA: Afebrile-no leukocytosis-completed a course of antimicrobial therapy.   Chronic pain syndrome/fibromyalgia/peripheral neuropathy: Appears comfortable-continue Neurontin/Subutex-just adjusted on 12/22.  Per patient-she takes Subutex approximately 4-5 times a day.   History of bipolar disorder/schizophrenia/anxiety disorder/OCD: Psychiatry following-we will defer further care to psychiatry.   BMI: Estimated body mass index is 21.12 kg/m as calculated from the following:   Height as of this encounter: 5\' 11"  (1.803 m).   Weight as of this encounter: 68.7 kg.    Procedures None  Discharge Diagnoses:  Principal Problem:   Intentional overdose ([place]) Active Problems:   QT prolongation   Adjustment disorder with mixed anxiety and depressed mood   Bipolar I disorder, most recent episode depressed [facility])   Discharge Instructions:  Activity:  As tolerated   Discharge Instructions     Diet general   Complete by: As directed    Increase activity slowly   Complete by: As directed       Allergies as of 02/19/2021       Reactions   Keflex [cephalexin] Anaphylaxis   Has tolerated amoxicillin 2019/2020        Medication List     STOP taking these medications    OXcarbazepine 150 MG tablet Commonly known as: Trileptal   zolpidem 10 MG tablet Commonly known as: AMBIEN       TAKE these medications    Ajovy 225 MG/1.5ML Sosy Generic drug: Fremanezumab-vfrm INJECT 225 MG INTO THE SKIN EVERY 30 (THIRTY) DAYS.   beclomethasone 40 MCG/ACT inhaler Commonly known  as: QVAR Inhale 1 puff into the lungs 2 (two) times daily as needed (asthma).   Botox 100 units Solr injection Generic drug: botulinum toxin Type A Inject 200 Units into the muscle every 3  (three) months. Inject into head and neck muscles   buprenorphine 2 MG Subl SL tablet Commonly known as: SUBUTEX Place 4 mg under the tongue in the morning, at noon, in the evening, and at bedtime.   clonazePAM 1 MG tablet Commonly known as: KLONOPIN Take 1 mg by mouth 4 (four) times daily.   gabapentin 400 MG capsule Commonly known as: NEURONTIN Take 400 mg by mouth 4 (four) times daily.   ONE-A-DAY WOMENS FORMULA PO Take 1 tablet by mouth daily.   SUMAtriptan 100 MG tablet Commonly known as: IMITREX TAKE 1 TABLET BY MOUTH EVERY 2 HOURS AS NEEDED FOR MIGRAINE. MAY DOSE 2 PER DAY OR 3 TIMES A WEEK. 12 TABLETS PER 30 DAYS, NO EARLY REFILL        Follow-up Information     [name], MD. Schedule an appointment as soon as possible for a visit in 1 week(s).   Specialty: Internal Medicine Contact information: [address] [place] [address] [phone]                Allergies  Allergen Reactions   Keflex [Cephalexin] Anaphylaxis    Has tolerated amoxicillin 2019/2020      Consultations: PCCM, psychiatry.   Other Procedures/Studies: DG Chest 2 View  Result Date: 02/09/2021 CLINICAL DATA:  35 year old female with shortness of breath EXAM: CHEST - 2 VIEW COMPARISON:  02/07/2021 FINDINGS: Cardiomediastinal silhouette unchanged in size and contour. No evidence of central vascular congestion. No interlobular septal thickening. Right IJ central venous catheter, with the tip appearing to terminate superior vena cava. No pneumothorax. Improved aeration compared 2 recent chest x-rays, with node definite confluent airspace disease at the right lung base. Vertical linear opacity overlying the right heart border is favored to represent atelectasis given the absence on the lateral view. IMPRESSION: Improved aeration of the lungs, with improved appearance of the right lung base. Presumed atelectasis persists. Unchanged right IJ central venous catheter Electronically Signed    By: Corrie Mckusick D.O.   On: 02/09/2021 13:05   CT Head Wo Contrast  Result Date: 02/07/2021 CLINICAL DATA:  35 year old female with history of mental status change. Unresponsive patient. Drug overdose. EXAM: CT HEAD WITHOUT CONTRAST TECHNIQUE: Contiguous axial images were obtained from the base of the skull through the vertex without intravenous contrast. COMPARISON:  Head CT 06/29/2020. FINDINGS: Brain: No evidence of acute infarction, hemorrhage, hydrocephalus, extra-axial collection or mass lesion/mass effect. Vascular: No hyperdense vessel or unexpected calcification. Skull: Normal. Negative for fracture or focal lesion. Sinuses/Orbits: No acute finding. Other: None. IMPRESSION: 1. No acute intracranial abnormalities. The appearance of the brain is normal. Electronically Signed   By: Vinnie Langton M.D.   On: 02/07/2021 06:52   DG CHEST PORT 1 VIEW  Result Date: 02/07/2021 CLINICAL DATA:  Encounter for central line placement. EXAM: PORTABLE CHEST 1 VIEW COMPARISON:  Chest radiograph 02/07/2021 FINDINGS: Stable endotracheal tube and nasogastric tube. Interval placement of a right central venous catheter with tip projecting at the distal SVC. Persistent right basilar pulmonary opacity. Left lung is clear. No pneumothorax or large pleural effusion. IMPRESSION: Interval placement of a right central venous catheter with tip projecting at the distal SVC. Electronically Signed   By: Audie Pinto M.D.   On: 02/07/2021 18:52  DG CHEST PORT 1 VIEW  Result Date: 02/07/2021 CLINICAL DATA:  Check central line placement EXAM: PORTABLE CHEST 1 VIEW COMPARISON:  02/07/2021 FINDINGS: Previously seen left subclavian central line is again identified extending into the left jugular vein. Endotracheal tube and gastric catheter are again seen and stable. Cardiac shadow is within normal limits. Lungs are well aerated bilaterally. Some improved aeration in the right base is seen. No new focal abnormality is  noted. IMPRESSION: Malposition left subclavian central line similar to that noted on the prior study. Improved aeration in the right base. Electronically Signed   By: Inez Catalina M.D.   On: 02/07/2021 17:47   DG CHEST PORT 1 VIEW  Result Date: 02/07/2021 CLINICAL DATA:  Potential overdose. EXAM: PORTABLE CHEST 1 VIEW COMPARISON:  Earlier same day FINDINGS: Interval placement of left subclavian vein approach central venous catheter, coursing superiorly, tip excluded from view. Otherwise, stable positioning of remaining support apparatus. No pneumothorax. Slight worsening of right basilar heterogeneous opacities. No pleural effusion or pneumothorax. No evidence of edema. IMPRESSION: 1. Malpositioned left subclavian central venous catheter, coursing superiorly, tip excluded from view. Repositioning is advised. 2. Otherwise, stable positioning of support apparatus. No pneumothorax. 3. Slight worsening of right basilar opacities, atelectasis versus infiltrate/aspiration. Electronically Signed   By: [name] M.D.   On: 02/07/2021 12:59   Portable Chest x-ray  Result Date: 02/07/2021 CLINICAL DATA:  Placement of endotracheal and orogastric tubes. EXAM: PORTABLE CHEST 1 VIEW COMPARISON:  01/25/2020 and older studies. FINDINGS: Cardiac silhouette is normal in size. No mediastinal or hilar masses. Mild opacity at the right lung base consistent with atelectasis. Remainder of the lungs is clear. No convincing pleural effusion and no pneumothorax. Endotracheal tube has its tip projecting 2.6 cm above the carinal. Nasal/orogastric tube passes well below the diaphragm into the stomach. IMPRESSION: 1. Well-positioned endotracheal tube and nasal/orogastric tube. 2. No acute findings in the lungs. Electronically Signed   By: Lajean Manes M.D.   On: 02/07/2021 08:56     TODAY-DAY OF DISCHARGE:  Subjective:   Miri Jose today has no headache,no chest abdominal pain,no new weakness tingling or numbness, feels  much better wants to go home today.   Objective:   Blood pressure (!) 98/59, pulse 93, temperature 98.6 F (37 C), temperature source Oral, resp. rate 20, height 5\' 11"  (1.803 m), weight 69.3 kg, SpO2 99 %.  Intake/Output Summary (Last 24 hours) at 02/19/2021 0946 Last data filed at 02/19/2021 0841 Gross per 24 hour  Intake 360 ml  Output --  Net 360 ml   Filed Weights   02/17/21 0500 02/18/21 1955 02/19/21 0404  Weight: 68.7 kg 69.3 kg 69.3 kg    Exam: Awake Alert, Oriented *3, No new F.N deficits, Normal affect [place].AT,PERRAL Supple Neck,No JVD, No cervical lymphadenopathy appriciated.  Symmetrical Chest wall movement, Good air movement bilaterally, CTAB RRR,No Gallops,Rubs or new Murmurs, No Parasternal Heave +ve B.Sounds, Abd Soft, Non tender, No organomegaly appriciated, No rebound -guarding or rigidity. No Cyanosis, Clubbing or edema, No new Rash or bruise   PERTINENT RADIOLOGIC STUDIES: No results found.   PERTINENT LAB RESULTS: CBC: No results for input(s): WBC, HGB, HCT, PLT in the last 72 hours. CMET CMP     Component Value Date/Time   NA 140 02/10/2021 1422   K 3.8 02/10/2021 1422   CL 107 02/10/2021 1422   CO2 27 02/10/2021 1422   GLUCOSE 97 02/10/2021 1422   BUN <5 (L) 02/10/2021 1422   CREATININE  0.61 02/10/2021 1422   CALCIUM 9.0 02/10/2021 1422   PROT 7.0 02/07/2021 0200   ALBUMIN 4.1 02/07/2021 0200   AST 25 02/07/2021 0200   ALT 19 02/07/2021 0200   ALKPHOS 58 02/07/2021 0200   BILITOT 1.1 02/07/2021 0200   GFRNONAA >60 02/10/2021 1422   GFRAA >60 07/30/2019 1815    GFR Estimated Creatinine Clearance: 106.4 mL/min (by C-G formula based on SCr of 0.61 mg/dL). No results for input(s): LIPASE, AMYLASE in the last 72 hours. No results for input(s): CKTOTAL, CKMB, CKMBINDEX, TROPONINI in the last 72 hours. Invalid input(s): POCBNP No results for input(s): DDIMER in the last 72 hours. No results for input(s): HGBA1C in the last 72 hours. No  results for input(s): CHOL, HDL, LDLCALC, TRIG, CHOLHDL, LDLDIRECT in the last 72 hours. No results for input(s): TSH, T4TOTAL, T3FREE, THYROIDAB in the last 72 hours.  Invalid input(s): FREET3 No results for input(s): VITAMINB12, FOLATE, FERRITIN, TIBC, IRON, RETICCTPCT in the last 72 hours. Coags: No results for input(s): INR in the last 72 hours.  Invalid input(s): PT Microbiology: Recent Results (from the past 240 hour(s))  Resp Panel by RT-PCR (Flu A&B, Covid) Nasopharyngeal Swab     Status: None   Collection Time: 02/18/21  6:21 PM   Specimen: Nasopharyngeal Swab; Nasopharyngeal(NP) swabs in vial transport medium  Result Value Ref Range Status   SARS Coronavirus 2 by RT PCR NEGATIVE NEGATIVE Final    Comment: (NOTE) SARS-CoV-2 target nucleic acids are NOT DETECTED.  The SARS-CoV-2 RNA is generally detectable in upper respiratory specimens during the acute phase of infection. The lowest concentration of SARS-CoV-2 viral copies this assay can detect is 138 copies/mL. A negative result does not preclude SARS-Cov-2 infection and should not be used as the sole basis for treatment or other patient management decisions. A negative result may occur with  improper specimen collection/handling, submission of specimen other than nasopharyngeal swab, presence of viral mutation(s) within the areas targeted by this assay, and inadequate number of viral copies(<138 copies/mL). A negative result must be combined with clinical observations, patient history, and epidemiological information. The expected result is Negative.  Fact Sheet for Patients:  EntrepreneurPulse.com.au  Fact Sheet for Healthcare Providers:  IncredibleEmployment.be  This test is no t yet approved or cleared by the [place] FDA and  has been authorized for detection and/or diagnosis of SARS-CoV-2 by FDA under an Emergency Use Authorization (EUA). This EUA will remain  in effect  (meaning this test can be used) for the duration of the COVID-19 declaration under Section 564(b)(1) of the Act, 21 U.S.C.section 360bbb-3(b)(1), unless the authorization is terminated  or revoked sooner.       Influenza A by PCR NEGATIVE NEGATIVE Final   Influenza B by PCR NEGATIVE NEGATIVE Final    Comment: (NOTE) The Xpert Xpress SARS-CoV-2/FLU/RSV plus assay is intended as an aid in the diagnosis of influenza from Nasopharyngeal swab specimens and should not be used as a sole basis for treatment. Nasal washings and aspirates are unacceptable for Xpert Xpress SARS-CoV-2/FLU/RSV testing.  Fact Sheet for Patients: EntrepreneurPulse.com.au  Fact Sheet for Healthcare Providers: IncredibleEmployment.be  This test is not yet approved or cleared by the [place] FDA and has been authorized for detection and/or diagnosis of SARS-CoV-2 by FDA under an Emergency Use Authorization (EUA). This EUA will remain in effect (meaning this test can be used) for the duration of the COVID-19 declaration under Section 564(b)(1) of the Act, 21 U.S.C. section 360bbb-3(b)(1), unless the authorization  is terminated or revoked.  Performed at [facility], [place] [address] [place], [place] [address]     FURTHER DISCHARGE INSTRUCTIONS:  Get Medicines reviewed and adjusted: Please take all your medications with you for your next visit with your Primary MD  Laboratory/radiological data: Please request your Primary MD to go over all hospital tests and procedure/radiological results at the follow up, please ask your Primary MD to get all Hospital records sent to his/her office.  In some cases, they will be blood work, cultures and biopsy results pending at the time of your discharge. Please request that your primary care M.D. goes through all the records of your hospital data and follows up on these results.  Also Note the following: If you experience  worsening of your admission symptoms, develop shortness of breath, life threatening emergency, suicidal or homicidal thoughts you must seek medical attention immediately by calling 911 or calling your MD immediately  if symptoms less severe.  You must read complete instructions/literature along with all the possible adverse reactions/side effects for all the Medicines you take and that have been prescribed to you. Take any new Medicines after you have completely understood and accpet all the possible adverse reactions/side effects.   Do not drive when taking Pain medications or sleeping medications (Benzodaizepines)  Do not take more than prescribed Pain, Sleep and Anxiety Medications. It is not advisable to combine anxiety,sleep and pain medications without talking with your primary care practitioner  Special Instructions: If you have smoked or chewed Tobacco  in the last 2 yrs please stop smoking, stop any regular Alcohol  and or any Recreational drug use.  Wear Seat belts while driving.  Please note: You were cared for by a hospitalist during your hospital stay. Once you are discharged, your primary care physician will handle any further medical issues. Please note that NO REFILLS for any discharge medications will be authorized once you are discharged, as it is imperative that you return to your primary care physician (or establish a relationship with a primary care physician if you do not have one) for your post hospital discharge needs so that they can reassess your need for medications and monitor your lab values.  Total Time spent coordinating discharge including counseling, education and face to face time equals 25 minutes.  SignedOren Binet 02/19/2021 9:46 AM

## 2021-02-27 ENCOUNTER — Telehealth (HOSPITAL_COMMUNITY): Payer: Self-pay | Admitting: *Deleted

## 2021-02-27 NOTE — Telephone Encounter (Signed)
Call from Carmen Daniels, patients mom to report Carmen Daniels tried to commit suicide in early Dec and after being in [place] for while is now in [place]. Mom states in addition to her mental illness and neuropathy she has been using Methamphetamines and heroin. [facility] is recommending she go to a state hospital and when I mentioned [facility] she said no they were referring her to a state hospital in [facility] and several other places. To my memory the places they were referring her to are substance rehab centers. Carmen Daniels is saying [facility] told her to contact [facility] her outpatient provider for the referral to the state hospital. I am unsure why this clinic would be the referring agency but assurred Carmen Daniels Dr Ronne Binning would do what she can to help Carmen Daniels but I gave her the number to the [facility] for long term resources as mom is not going to let her come home and mom feels she needs mental health, sub abuse and medical assistance. Mom states she has developed a heart condition from worrying about Carmen Daniels and her husband has dementia and she cares for him too and cant continue on her own to also care for Carmen Daniels esp with the new knowledge of her drug use and the risk that adds. Told her to call Carmen Daniels and get what info and understanding she could from them as they manage the counties services and money and to call Monday with any follow up issues.

## 2021-03-02 ENCOUNTER — Telehealth: Payer: Self-pay | Admitting: Neurology

## 2021-03-02 NOTE — Telephone Encounter (Signed)
Pt's mother, Arlie Solomons (on [facility]) wanted to notify Dr. Otis Dials attempted suicide. May need a referral for a facility that would treat her neuropathy. If need any referrals will call back

## 2021-03-02 NOTE — Telephone Encounter (Signed)
Provider spoke to patient's sister and  who notes that patient continues to be admitted  at [facility].Patient mother notes that she is concerned about her daughter's wellbeing as she has been suicidal, misusing  illegal drugs, and refusing to eat/drink.  She notes that she cannot come back to her home as she has cardiac issues and her husband has dementia.    Patient mother informed Probation officer that [facility] will not refer her to a state facility and requested that her outpatient provider make the referral.  Provider has not seen patient in over 4 months and does not have upcoming visit with patient.  Provider informed patient's mother and sister that it is appropriate for [facility] to be the referring hospital as she is currently hospitalized there.  She endorsed understanding and agreed.  Provider did have administrative staff email substance use treatment facilities for future resources if needed.  No other concerns noted at this time.

## 2021-03-04 ENCOUNTER — Ambulatory Visit: Payer: Medicaid Other | Admitting: Neurology

## 2021-03-25 ENCOUNTER — Ambulatory Visit (INDEPENDENT_AMBULATORY_CARE_PROVIDER_SITE_OTHER): Payer: Medicaid Other | Admitting: Psychiatry

## 2021-03-25 ENCOUNTER — Encounter (HOSPITAL_COMMUNITY): Payer: Self-pay | Admitting: Psychiatry

## 2021-03-25 ENCOUNTER — Telehealth (HOSPITAL_COMMUNITY): Payer: Self-pay | Admitting: *Deleted

## 2021-03-25 DIAGNOSIS — F319 Bipolar disorder, unspecified: Secondary | ICD-10-CM

## 2021-03-25 NOTE — Progress Notes (Addendum)
BH MD/PA/NP OP Progress Note  03/25/2021 4:27 PM ABAGAIL LIMB  MRN:  [id number]  Chief Complaint: Medication management following hospital discharge  Visit Diagnosis:    ICD-10-CM   1. Bipolar 1 disorder, depressed ([place])  F31.9      Virtual Visit via Telephone Note  I connected with Ladona Horns on 03/25/21 at  2:00 PM EST by telephone and verified that I am speaking with the correct person using two identifiers.  Location: Patient: home Provider: clinic   I discussed the limitations, risks, security and privacy concerns of performing an evaluation and management service by telephone and the availability of in person appointments. I also discussed with the patient that there may be a patient responsible charge related to this service. The patient expressed understanding and agreed to proceed.   HPI: Andersen Mckiver is a 36 year old female seen via virtual telephone visit for follow-up after recent discharge from [place] [place] due to suicidal ideations. She reports not being able to afford her new medication, Caplyta. Patient reports that she was administered Caplyta during her inpatient stay and the pharmacy informed that the cost is $1600 to fill her prescription. Caplyta is effective in managing her symptoms. This Probation officer contacted the pharmacy and requested a prior authorization for the patient's medication. Chamika was informed that the pharmacy representative sent the prior authorization to the prescriber.   Patient was able to get access to her medication for a cost of $4 following prior authorization.  I discussed the assessment and treatment plan with the patient. The patient was provided an opportunity to ask questions and all were answered. The patient agreed with the plan and demonstrated an understanding of the instructions.   The patient was advised to call back or seek an in-person evaluation if the symptoms worsen or if the condition fails to improve as  anticipated.  I provided 30 minutes of non-face-to-face time during this encounter.   Franne Grip, NP  Past Psychiatric History: Bipolar disorder, depressed episode and as follows.  Past Medical History:  Past Medical History:  Diagnosis Date   Allergic rhinitis    Anxiety    Asthma    Bipolar disorder ([place])    Borderline personality disorder ([place])    Bupropion overdose    Chronic pain syndrome    Depression    Fibromyalgia    generalized   Hallucinations    Migraines    Nasal polyps    OCD (obsessive compulsive disorder)    Ovarian cyst    left   Pneumonia    2021   PONV (postoperative nausea and vomiting)    Schizophrenia ([place])    schizoaffective    Past Surgical History:  Procedure Laterality Date   ABDOMINAL HYSTERECTOMY     LAPAROSCOPIC TUBAL LIGATION Bilateral 06/13/2019   Procedure: LAPAROSCOPIC TUBAL LIGATION;  Surgeon: Chancy Milroy, MD;  Location: [place];  Service: Gynecology;  Laterality: Bilateral;  FILSHIE CLIPS   NASAL SINUS SURGERY     TUBAL LIGATION     tubes in ears     VAGINAL HYSTERECTOMY Bilateral 01/15/2020   Procedure: HYSTERECTOMY VAGINAL;  Surgeon: Chancy Milroy, MD;  Location: [place];  Service: Gynecology;  Laterality: Bilateral;   WISDOM TOOTH EXTRACTION      Family Psychiatric History: See below.  Family History:  Family History  Problem Relation Age of Onset   Healthy Mother    Healthy Father    Arthritis Maternal Grandmother  Arthritis Maternal Grandfather    Depression Paternal Grandmother    Hyperlipidemia Other    Stroke Other    Hypertension Other    Cancer Other    COPD Other    Asthma Other     Social History:  Social History   Socioeconomic History   Marital status: Married    Spouse name: Not on file   Number of children: 0   Years of education: 14   Highest education level: Not on file  Occupational History   Occupation: Unemployed  Tobacco Use   Smoking status: Every Day     Packs/day: 0.50    Years: 15.00    Pack years: 7.50    Types: Cigarettes   Smokeless tobacco: Never  Vaping Use   Vaping Use: Every day   Substances: Nicotine  Substance and Sexual Activity   Alcohol use: Never   Drug use: Yes    Comment: prescribed narcotics   Sexual activity: Not on file  Other Topics Concern   Not on file  Social History Narrative   Lives with parents.   Right-handed.   3 glasses of green tea per day.   Social Determinants of Radio broadcast assistant Strain: Not on file  Food Insecurity: No Food Insecurity   Worried About Charity fundraiser in the Last Year: Never true   Ran Out of Food in the Last Year: Never true  Transportation Needs: Not on file  Physical Activity: Not on file  Stress: Not on file  Social Connections: Not on file    Allergies:  Allergies  Allergen Reactions   Keflex [Cephalexin] Anaphylaxis    Has tolerated amoxicillin 9371/6967    Metabolic Disorder Labs: Lab Results  Component Value Date   HGBA1C 4.7 (L) 02/07/2021   MPG 88.19 02/07/2021   No results found for: PROLACTIN Lab Results  Component Value Date   TRIG 341 (H) 04/03/2019   Lab Results  Component Value Date   TSH 0.972 02/15/2021   TSH 4.304 07/23/2020    Therapeutic Level Labs: Lab Results  Component Value Date   LITHIUM <0.06 (L) 07/30/2019   LITHIUM <0.06 (L) 03/30/2019   No results found for: VALPROATE No components found for:  CBMZ  Current Medications: Current Outpatient Medications  Medication Sig Dispense Refill   beclomethasone (QVAR) 40 MCG/ACT inhaler Inhale 1 puff into the lungs 2 (two) times daily as needed (asthma).      botulinum toxin Type A (BOTOX) 100 units SOLR injection Inject 200 Units into the muscle every 3 (three) months. Inject into head and neck muscles 2 each 3   buprenorphine (SUBUTEX) 2 MG SUBL SL tablet Place 4 mg under the tongue in the morning, at noon, in the evening, and at bedtime.     clonazePAM (KLONOPIN)  1 MG tablet Take 1 mg by mouth 4 (four) times daily.     Fremanezumab-vfrm (AJOVY) 225 MG/1.5ML SOSY INJECT 225 MG INTO THE SKIN EVERY 30 (THIRTY) DAYS. 1.5 mL 11   gabapentin (NEURONTIN) 400 MG capsule Take 400 mg by mouth 4 (four) times daily.     Multiple Vitamins-Calcium (ONE-A-DAY WOMENS FORMULA PO) Take 1 tablet by mouth daily.     SUMAtriptan (IMITREX) 100 MG tablet TAKE 1 TABLET BY MOUTH EVERY 2 HOURS AS NEEDED FOR MIGRAINE. MAY DOSE 2 PER DAY OR 3 TIMES A WEEK. 12 TABLETS PER 30 DAYS, NO EARLY REFILL 12 tablet 2   No current facility-administered medications for this visit.  Musculoskeletal: Strength & Muscle Tone: decreased Gait & Station: unsteady Patient leans: N/A  Psychiatric Specialty Exam: Review of Systems  Psychiatric/Behavioral:  Negative for agitation, hallucinations, self-injury and suicidal ideas.   All other systems reviewed and are negative.  There were no vitals taken for this visit.There is no height or weight on file to calculate BMI.  General Appearance: NA  Eye Contact:  NA  Speech:  Clear and Coherent  Volume:  Normal  Mood:  Euthymic  Affect:  NA  Thought Process:  Coherent  Orientation:  Full (Time, Place, and Person)  Thought Content: Logical   Suicidal Thoughts:  No  Homicidal Thoughts:  No  Memory:  Immediate;   Good Recent;   Good Remote;   Good  Judgement:  Good  Insight:  Good  Psychomotor Activity:  NA  Concentration:  Concentration: Good and Attention Span: Good  Recall:  Good  Fund of Knowledge: Good  Language: Good  Akathisia:  NA  Handed:  Right  AIMS (if indicated): not done  Assets:  Communication Skills Desire for Improvement Housing Social Support  ADL's:  Intact  Cognition: WNL  Sleep:  Good   Screenings: AIMS    Flowsheet Row Clinical Support from 11/26/2019 in [facility] Admission (Discharged) from 04/10/2017 in [facility]  AIMS Total Score 5 0       GAD-7    Flowsheet Row Video Visit from 10/30/2020 in [facility] Video Visit from 08/11/2020 in [place] from 03/31/2020 in [facility]  Total GAD-7 Score 20 13 18       [place] Office Visit from 10/05/2016 in [facility]  Total Score (max 30 points ) 26      PHQ2-9    Flowsheet Row Nutrition [facility] 10/29/2020 [facility] Video Visit from 08/11/2020 in [facility] ED from 08/05/2020 in [place] DEPT Clinical Support from 03/31/2020 in [facility] ED from 09/06/2019 in [facility]  PHQ-2 Total Score 3 4 4 6 1   PHQ-9 Total Score 18 15 8 19  --      Flowsheet Row ED from 09/18/2020 in [place] ED from 09/16/2020 in [place] Video Visit from 08/11/2020 in [place] No Risk No Risk No Risk        Assessment and Plan: Destinee Taber is a 36 year old female presenting to [facility] with concerns that her medication is unaffordable, at $1600 to fill her script. Patient recently discharged from [facility] and was prescribed Caplyta upon discharge. This medication requires prior authorization by the presciber prior to being covered by [facility] for pharmacy dispensing. These details discussed with Mallorey and patient was assisted with obtaining prior authorization to obtain her medication. Patient was able to access Caplyta from the pharmacy at a cost of $4, following prior authorization. Discharge summary requested from recent hospitalization for care coordination. Patient will return to care in 4 weeks.  Visit Diagnosis: Bipolar I disorder,  depressed ([place]) F31.9   Franne Grip, NP 03/25/2021, 4:27 PM

## 2021-03-25 NOTE — Telephone Encounter (Signed)
VM from [place] at [facility] to inform writer and clinic that patient was discharged on 1/30 and was D/C with a rx for Caplyta they cant afford, 1600.00. She has [place] so unsure why the rx would be 1600.00 unless no one took the time to do a prior authorization. Called mom and left her a message to call me back to discuss rx and she also needs a future appt or walk in.

## 2021-04-01 ENCOUNTER — Ambulatory Visit: Payer: Medicaid Other | Admitting: Neurology

## 2021-04-01 ENCOUNTER — Encounter: Payer: Self-pay | Admitting: Neurology

## 2021-04-01 VITALS — BP 101/64 | HR 86 | Ht 71.0 in | Wt 152.0 lb

## 2021-04-01 DIAGNOSIS — G43719 Chronic migraine without aura, intractable, without status migrainosus: Secondary | ICD-10-CM | POA: Diagnosis not present

## 2021-04-01 NOTE — Progress Notes (Signed)
Botox injection for chronic migraine prevention, injection was performed according to [place] protocol,  5 units of Botox was injected into each side, for 31 injection sites, total of 155 units  Bilateral frontalis 4 injection sites Bilateral corrugate 2 injection sites Procerus 1 injection sites. Bilateral temporalis 8 injection sites Bilateral occipitalis 6 injection sites Bilateral cervical paraspinals 4 injection sites Bilateral upper trapezius 6 injection sites  Extra 45 unites were injected into bilateral temporoparietal region upper cervical region

## 2021-04-01 NOTE — Progress Notes (Signed)
Botox 200 units  [id number] [phone] Exp-06-2023 SP

## 2021-04-02 MED ORDER — SUMATRIPTAN SUCCINATE 100 MG PO TABS: ORAL_TABLET | ORAL | 11 refills | Status: DC

## 2021-04-02 MED ORDER — DULOXETINE HCL 60 MG PO CPEP: 60.0000 mg | ORAL_CAPSULE | Freq: Every day | ORAL | 11 refills | Status: DC

## 2021-04-16 NOTE — Therapy (Signed)
OUTPATIENT PHYSICAL THERAPY LOWER EXTREMITY EVALUATION   Patient Name: Carmen Daniels MRN: [id number] DOB:December 17, 1985, 36 y.o., female Today's Date: 04/17/2021   PT End of Session - 04/17/21 1353     Visit Number 1    Number of Visits 17    Date for PT Re-Evaluation 06/12/21    Authorization Type [place] MCD    Authorization Time Period Pending auth    PT Start Time 1255    PT Stop Time 1345    PT Time Calculation (min) 50 min    Equipment Utilized During Treatment Gait belt    Activity Tolerance Patient tolerated treatment well    Behavior During Therapy WFL for tasks assessed/performed             Past Medical History:  Diagnosis Date   Allergic rhinitis    Anxiety    Asthma    Bipolar disorder ([place])    Borderline personality disorder ([place])    Bupropion overdose    Chronic pain syndrome    Depression    Fibromyalgia    generalized   Hallucinations    Migraines    Nasal polyps    OCD (obsessive compulsive disorder)    Ovarian cyst    left   Pneumonia    2021   PONV (postoperative nausea and vomiting)    Schizophrenia ([place])    schizoaffective   Past Surgical History:  Procedure Laterality Date   ABDOMINAL HYSTERECTOMY     LAPAROSCOPIC TUBAL LIGATION Bilateral 06/13/2019   Procedure: LAPAROSCOPIC TUBAL LIGATION;  Surgeon: Chancy Milroy, MD;  Location: [place];  Service: Gynecology;  Laterality: Bilateral;  FILSHIE CLIPS   NASAL SINUS SURGERY     TUBAL LIGATION     tubes in ears     VAGINAL HYSTERECTOMY Bilateral 01/15/2020   Procedure: HYSTERECTOMY VAGINAL;  Surgeon: Chancy Milroy, MD;  Location: [place];  Service: Gynecology;  Laterality: Bilateral;   WISDOM TOOTH EXTRACTION     Patient Active Problem List   Diagnosis Date Noted   Intentional overdose ([place]) 02/07/2021   Weakness 08/19/2020   Gait abnormality 08/19/2020   Neuropathic pain 08/19/2020   Malnutrition of moderate degree 07/01/2020   Acute renal failure ([place]) 06/30/2020    Non-traumatic rhabdomyolysis    Elevated LFTs    Tardive dyskinesia 11/14/2019   Bipolar I disorder, most recent episode depressed ([place]) 09/13/2019   Social anxiety disorder 09/13/2019   Adjustment disorder with mixed anxiety and depressed mood 07/31/2019   Suicidal ideation    Post-operative state 07/19/2019   Seizures ([place]) 06/08/2019   Bipolar 1 disorder, depressed ([place]) 05/17/2019   Status epilepticus ([place]) 05/17/2019   Intentional benzodiazepine overdose ([place]) 03/29/2019   Intractable chronic 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 status migrainosus 02/08/2019   History of elective abortion 12/28/2018   QT prolongation 07/03/2018   AMS (altered mental status) 07/03/2018   Serum total bilirubin elevated 07/03/2018   Syncope 07/02/2018   Borderline personality disorder ([place]) 04/11/2017   Bipolar I disorder, current or most recent episode manic, with psychotic features ([place]) 04/10/2017   Bipolar disorder ([place]) 04/10/2017   Abnormal MRI of head 04/21/2016   Chronic migraine without aura 03/27/2015   Mild persistent asthma 12/30/2014   Allergic rhinitis due to pollen 12/30/2014   Rhinitis medicamentosa 12/30/2014   Nasal polyposis 12/30/2014    PCP: [name], MD  REFERRING PROVIDER: [name], MD  REFERRING DIAG: G60.9 (ICD-10-CM) - Hereditary and idiopathic neuropathy, unspecified  THERAPY DIAG:  Muscle weakness (generalized)  Unsteadiness on feet  Pain in right foot  Pain in left foot  Other disturbances of skin sensation  ONSET DATE: 06/28/2020  SUBJECTIVE:   SUBJECTIVE STATEMENT: Pt was seen previously last year in PT following an overdose and subsequent rhabdomyolysis leading to inability to ambulate and nerve damage to the lower extremities. Since that time, the pt had made excellent progress toward ambulating with a walker prior to stopping coming to PT in November. Following this, she had become manic-depressive according to her and her mother, who is present during  today's eval. In mid-December, she had an intentional overdose of prescription pills, which led to a 6-week hospitalization in [facility] and two mental health facilities. She was discharged from the hospital on January 30th. She denies any current or recent homicidal or suicidal ideation. She reports that since being discharged, she has practiced walking with her walker regularly and comes in today with the walker. She reports that she has also had Rt ankle swelling since that time. She reports that she can currently walk about 30 feet without an AD, although she primarily uses her FWW for ambulation. Pt adds that she received cortisone injections into BIL feet in late November, which helped with her pain, although she still has plantar bruising from these injections. She reports that her BIL foot pain is still present, although she states her pain has moved to her calves (she had imaging in the hospital recently that ruled out DVT). She also reports that she primarily hopes to strengthen her LE since she lost muscle mass during hospitalization. She also denies any numbness or tingling in her BIL LE. Pt denies any unexplained weight changes, saddle anesthesia, unrelenting night pain, and nausea/ vomiting.  PERTINENT HISTORY: Hx of Bipolar I disorder, anxiety, depression, borderline personality disorder, attempted suicide, rhabdomyolysis leading to LE peripheral nerve damage  PAIN:  Are you having pain? Yes NPRS scale: 3/10 Pain location: BIL calves, feet PAIN TYPE: aching and dull Pain description: intermittent  Aggravating factors: walking/ standing >5 minutes Relieving factors: rest  PRECAUTIONS: None  WEIGHT BEARING RESTRICTIONS No  FALLS:  Has patient fallen in last 6 months? No, Number of falls: 0  LIVING ENVIRONMENT: Lives with: lives with their family Lives in: House/apartment Stairs: No;  Has following equipment at home: Environmental consultant - 2 wheeled and Electronics engineer  OCCUPATION:  Unemployed  PLOF: Independent  PATIENT GOALS Walk independently, drive   Screening for Suicide  Answer the following questions with Yes or No and place an "x" beside the action taken.  1. Over the past two weeks, have you felt down, depressed, or hopeless?   No  2. Within the past two weeks, have you felt little interest or pleasure in life?  No  If YES to either #1 or #2, then ask #3  3. Have you had thoughts that that life is not worth living or that you might be       better off dead?     If answer is NO and suspicion is low, then end   4. Over this past week, have you had any thoughts about hurting or even killing yourself?    If NO, then end. Patient in no immediate danger   5. If so, do you believe that you intend to or will harm yourself?       If NO, then end. Patient in no immediate danger   6.  Do you have a plan as to how you  would hurt yourself?     7.  Over this past week, have you actually done anything to hurt yourself?    IF YES answers to either #4, #5, #6 or #7, then patient is AT RISK for suicide   Actions Taken  __X__  Screening negative; no further action required  ____  Screening positive; no immediate danger and patient already in treatment with a  mental health provider. Advise patient to speak to their mental health provider.  ____  Screening positive; no immediate danger. Patient advised to contact a mental  health provider for further assessment.   ____  Screening positive; in immediate danger as patient states intention of killing self,  has plan and a sense of imminence. Do not leave alone. Seek permission from  patient to contact a family member to inform them. Direct patient to go to ED.   OBJECTIVE:   DIAGNOSTIC FINDINGS: None current for primary problems  PATIENT SURVEYS:  LEFS Administer at treatment 1  COGNITION:  Overall cognitive status: History of cognitive impairments - at baseline     SENSATION:  Light touch:  Deficits L5 dermatome 25% diminished on Lt  MUSCLE LENGTH: Hamstrings: WNL Thomas test: WNL BIL Gastroc and soleus: Severely limited BIL  POSTURE:  Forward posture when standing in FWW  PALPATION: Palpable and painful trigger points to BIL peroneal musculature, mild TTP to BIL plantar surface of feet, primarily medial arches  LE AROM/PROM:  A/PROM Right 04/17/2021 Left 04/17/2021  Ankle dorsiflexion -12 -8  Ankle plantarflexion    Ankle inversion    Ankle eversion     (Blank rows = not tested)  LE MMT:  MMT Right 04/17/2021 Left 04/17/2021  Hip flexion 4+/5 4+/5  Hip extension 3/5 3/5  Hip abduction 4/5 4/5  Hip internal rotation 4+/5 4+/5  Hip external rotation 4/5 4+/5  Knee flexion 4+/5 4+/5  Knee extension 4+/5 4+/5  Ankle dorsiflexion 4/5 4/5  Ankle plantarflexion 4/5 4/5  Ankle inversion 3/5 3/5  Ankle eversion 3/5 3/5   (Blank rows = not tested)   FUNCTIONAL TESTS:  5xSTS: 18 seconds with use of hands, unable to perform without hands Squat: 25% DL heel raise: able to complete 10 with decreased elevation  GAIT: Distance walked: 46ft Assistive device utilized: None Level of assistance: SBA Comments: Pt ambulates with decreased hip extension BIL and poor eccentric quad control as indicated by knee buckling during early and mid stance.    TODAY'S TREATMENT: 04/17/2021: Issued and demonstrated HEP   PATIENT EDUCATION:  Education details: Objective findings, prognosis, POC, and HEP Person educated: Patient and Caregiver Education method: Explanation, Demonstration, and Handouts Education comprehension: verbalized understanding and returned demonstration   HOME EXERCISE PROGRAM: Access Code: [email or web address] URL: [email or web address] Date: 04/17/2021 Prepared by: Vanessa Glenfield  Exercises Supine Bridge - 1 x daily - 7 x weekly - 3 sets - 10 reps - 3-sec hold Sidelying Hip Abduction - 1 x daily - 7 x weekly - 3 sets - 10 reps - 3-sec  hold Seated knee extension with slow lowering - 1 x daily - 7 x weekly - 3 sets - 10 reps - 3-sec hold Heel Raises with Counter Support - 1 x daily - 7 x weekly - 3 sets - 10 reps - 3-sec hold   ASSESSMENT:  CLINICAL IMPRESSION: Patient is a 36 y.o. F who was seen today for physical therapy evaluation and treatment for chronic BIL LE weakness and pain following multiple overdoses and hospitalizations. Upon assessment, her  primary impairments include severely tight calf musculature, global BIL LE weakness, impaired gait mechanics, impaired standing balance, impaired gait mechanics, and impaired Lt L5 light touch sensation. Overall, the pt is in an improved functional state compared to during her previous bout of PT last year, despite an extended hospitalization late last year until January 31st. She will benefit from skilled PT to address her primary impairments and return to her prior level of function with less limitation.   OBJECTIVE IMPAIRMENTS Abnormal gait, decreased balance, decreased coordination, and decreased knowledge of use of DME.   ACTIVITY LIMITATIONS cleaning, community activity, driving, laundry, and shopping.   PERSONAL FACTORS Behavior pattern, Past/current experiences, and 3+ comorbidities: see medical hx  are also affecting patient's functional outcome.    REHAB POTENTIAL: Good  CLINICAL DECISION MAKING: Unstable/unpredictable  EVALUATION COMPLEXITY: High   GOALS: Goals reviewed with patient? Yes  SHORT TERM GOALS:  STG Name Target Date Goal status  1 Pt will report understanding and adherence to her HEP in order to promote independence in the management of her primary impairments. Baseline: HEP provided at eval. 05/15/2021 INITIAL  2 Pt will achieve 10 full-height heel raises in order to reach into overhead cabinets without limitation. Baseline: x10 with 50% decreased heel height 05/15/2021 INITIAL  3 Pt will complete a 6MWT with supervision and no AD in order to  progress to grocery shopping with less limitation. Baseline: Pt unable to walk more than 5 minutes (subjective report) due to fatigue. 05/15/2021 INITIAL   LONG TERM GOALS:   LTG Name Target Date Goal status  1 Pt will report ability to walk 20 minutes without AD in order to shop with her friends with less limitation. Baseline: Unable to walk >5 minutes due to fatigue 06/12/2021 INITIAL  2 Pt will achieve an LEFS score of ___ in order demonstrate improved functional ability as it relates to her primary impairments. Baseline: Administer at 1st treatment 06/12/2021 INITIAL  3 Pt will accomplish 5xSTS without UE support in under 13 seconds in order to demonstrate safe transfer ability. Baseline: 18 seconds with UE support 06/12/2021 INITIAL  4 Pt will achieve global BIL ankle strength of 4+/5 or greater in order to return to driving with less limitation. Baseline: See MMT chart 06/12/2021 INITIAL   PLAN: PT FREQUENCY: 2x/week  PT DURATION: 8 weeks  PLANNED INTERVENTIONS: Therapeutic exercises, Therapeutic activity, Neuro Muscular re-education, Balance training, Gait training, Patient/Family education, Joint mobilization, Stair training, DME instructions, Aquatic Therapy, Dry Needling, Electrical stimulation, Cryotherapy, Moist heat, Manual lymph drainage, Compression bandaging, Taping, Vasopneumatic device, Biofeedback, and Manual therapy  PLAN FOR NEXT SESSION: Administer LEFS, gait and transfer training, LE strengthening preferably in closed-chain   Vanessa Walsh, PT, DPT 04/17/21 2:14 PM

## 2021-04-17 ENCOUNTER — Ambulatory Visit: Payer: Medicaid Other | Attending: Internal Medicine

## 2021-04-17 ENCOUNTER — Other Ambulatory Visit: Payer: Self-pay

## 2021-04-17 DIAGNOSIS — R208 Other disturbances of skin sensation: Secondary | ICD-10-CM | POA: Insufficient documentation

## 2021-04-17 DIAGNOSIS — M6281 Muscle weakness (generalized): Secondary | ICD-10-CM | POA: Diagnosis not present

## 2021-04-17 DIAGNOSIS — R2681 Unsteadiness on feet: Secondary | ICD-10-CM | POA: Insufficient documentation

## 2021-04-17 DIAGNOSIS — M79671 Pain in right foot: Secondary | ICD-10-CM | POA: Diagnosis present

## 2021-04-17 DIAGNOSIS — M79672 Pain in left foot: Secondary | ICD-10-CM | POA: Diagnosis present

## 2021-04-20 ENCOUNTER — Telehealth (HOSPITAL_COMMUNITY): Payer: Medicaid Other | Admitting: Psychiatry

## 2021-04-20 ENCOUNTER — Encounter (HOSPITAL_COMMUNITY): Payer: Self-pay

## 2021-04-20 ENCOUNTER — Other Ambulatory Visit: Payer: Self-pay | Admitting: Neurology

## 2021-04-21 ENCOUNTER — Ambulatory Visit (INDEPENDENT_AMBULATORY_CARE_PROVIDER_SITE_OTHER): Payer: Medicaid Other | Admitting: Psychiatry

## 2021-04-21 DIAGNOSIS — F4010 Social phobia, unspecified: Secondary | ICD-10-CM | POA: Diagnosis not present

## 2021-04-21 DIAGNOSIS — F312 Bipolar disorder, current episode manic severe with psychotic features: Secondary | ICD-10-CM | POA: Diagnosis not present

## 2021-04-21 DIAGNOSIS — F5105 Insomnia due to other mental disorder: Secondary | ICD-10-CM | POA: Diagnosis not present

## 2021-04-21 MED ORDER — CAPLYTA 42 MG PO CAPS: 42.0000 mg | ORAL_CAPSULE | Freq: Every day | ORAL | 0 refills | Status: DC

## 2021-04-21 MED ORDER — TRAZODONE HCL 100 MG PO TABS: 100.0000 mg | ORAL_TABLET | Freq: Every evening | ORAL | 1 refills | Status: DC | PRN

## 2021-04-21 MED ORDER — HYDROXYZINE PAMOATE 50 MG PO CAPS: 50.0000 mg | ORAL_CAPSULE | Freq: Three times a day (TID) | ORAL | 0 refills | Status: DC | PRN

## 2021-04-21 NOTE — Progress Notes (Signed)
[place] MD/PA/NP OP Progress Note  04/21/2021 4:03 PM Carmen Daniels  MRN:  [id number]  Chief Complaint: Medication management Virtual Visit via Telephone Note  I connected with Carmen Daniels on 04/21/21 at  3:30 PM EST by telephone and verified that I am speaking with the correct person using two identifiers.  Location: Patient: home Provider: offsite   I discussed the limitations, risks, security and privacy concerns of performing an evaluation and management service by telephone and the availability of in person appointments. I also discussed with the patient that there may be a patient responsible charge related to this service. The patient expressed understanding and agreed to proceed.    I discussed the assessment and treatment plan with the patient. The patient was provided an opportunity to ask questions and all were answered. The patient agreed with the plan and demonstrated an understanding of the instructions.   The patient was advised to call back or seek an in-person evaluation if the symptoms worsen or if the condition fails to improve as anticipated.  I provided 24 minutes of non-face-to-face time during this encounter.   Franne Grip, NP   HPI: Carmen Daniels is a 36 year old female presenting to [facility] behavioral health outpatient for a follow up psychiatric evaluation, accompanied by her mother.  Patient has a past psychiatric history of bipolar 1 disorder with psychotic features, borderline personality disorder, and social anxiety disorder.  Her symptoms are managed with Caplyta 42 mg daily, lithium 300 mg twice daily, trazodone 50 mg at bedtime, and hydroxyzine 25 mg 3 times daily as needed for anxiety.  Patient is also prescribed medications by alternate providers as follows: Cymbalta 60 mg daily, Subutex 4 mg sublingual tablet 4 times daily, Klonopin 1 mg 4 times daily, Cymbalta 60 mg daily, and gabapentin 400 mg 4 times daily. Patient reports medication compliance  but continues to experience a anxiety and insomnia. Per her mother, patient is more stabilized in the past on this medication regimen.  Patient is alert and oriented x 4 and provided consent for her mother to speak on her behalf. Patient appeared easily irritable when asked questions regarding her symptoms, during the visit. She denies suicidal or homicidal ideations, paranoia, delusional thought, or auditory or visual hallucinations.  Visit Diagnosis:    ICD-10-CM   1. Bipolar I disorder, current or most recent episode manic, with psychotic features ([place])  F31.2       Past Psychiatric History: See below  Past Medical History:  Past Medical History:  Diagnosis Date   Allergic rhinitis    Anxiety    Asthma    Bipolar disorder ([place])    Borderline personality disorder ([place])    Bupropion overdose    Chronic pain syndrome    Depression    Fibromyalgia    generalized   Hallucinations    Migraines    Nasal polyps    OCD (obsessive compulsive disorder)    Ovarian cyst    left   Pneumonia    2021   PONV (postoperative nausea and vomiting)    Schizophrenia ([place])    schizoaffective    Past Surgical History:  Procedure Laterality Date   ABDOMINAL HYSTERECTOMY     LAPAROSCOPIC TUBAL LIGATION Bilateral 06/13/2019   Procedure: LAPAROSCOPIC TUBAL LIGATION;  Surgeon: Chancy Milroy, MD;  Location: [place];  Service: Gynecology;  Laterality: Bilateral;  FILSHIE CLIPS   NASAL SINUS SURGERY     TUBAL LIGATION  tubes in ears     VAGINAL HYSTERECTOMY Bilateral 01/15/2020   Procedure: HYSTERECTOMY VAGINAL;  Surgeon: Chancy Milroy, MD;  Location: [place];  Service: Gynecology;  Laterality: Bilateral;   WISDOM TOOTH EXTRACTION      Family Psychiatric History: See below  Family History:  Family History  Problem Relation Age of Onset   Healthy Mother    Healthy Father    Arthritis Maternal Grandmother    Arthritis Maternal Grandfather    Depression Paternal  Grandmother    Hyperlipidemia Other    Stroke Other    Hypertension Other    Cancer Other    COPD Other    Asthma Other     Social History:  Social History   Socioeconomic History   Marital status: Married    Spouse name: Not on file   Number of children: 0   Years of education: 14   Highest education level: Not on file  Occupational History   Occupation: Unemployed  Tobacco Use   Smoking status: Every Day    Packs/day: 0.50    Years: 15.00    Pack years: 7.50    Types: Cigarettes   Smokeless tobacco: Never  Vaping Use   Vaping Use: Every day   Substances: Nicotine  Substance and Sexual Activity   Alcohol use: Never   Drug use: Yes    Comment: prescribed narcotics   Sexual activity: Not on file  Other Topics Concern   Not on file  Social History Narrative   Lives with parents.   Right-handed.   3 glasses of green tea per day.   Social Determinants of Radio broadcast assistant Strain: Not on file  Food Insecurity: No Food Insecurity   Worried About Charity fundraiser in the Last Year: Never true   Ran Out of Food in the Last Year: Never true  Transportation Needs: Not on file  Physical Activity: Not on file  Stress: Not on file  Social Connections: Not on file    Allergies:  Allergies  Allergen Reactions   Keflex [Cephalexin] Anaphylaxis    Has tolerated amoxicillin 5784/6962    Metabolic Disorder Labs: Lab Results  Component Value Date   HGBA1C 4.7 (L) 02/07/2021   MPG 88.19 02/07/2021   No results found for: PROLACTIN Lab Results  Component Value Date   TRIG 341 (H) 04/03/2019   Lab Results  Component Value Date   TSH 0.972 02/15/2021   TSH 4.304 07/23/2020    Therapeutic Level Labs: Lab Results  Component Value Date   LITHIUM <0.06 (L) 07/30/2019   LITHIUM <0.06 (L) 03/30/2019   No results found for: VALPROATE No components found for:  CBMZ  Current Medications: Current Outpatient Medications  Medication Sig Dispense Refill    beclomethasone (QVAR) 40 MCG/ACT inhaler Inhale 1 puff into the lungs 2 (two) times daily as needed (asthma).      botulinum toxin Type A (BOTOX) 100 units SOLR injection Inject 200 Units into the muscle every 3 (three) months. Inject into head and neck muscles 2 each 3   buprenorphine (SUBUTEX) 2 MG SUBL SL tablet Place 4 mg under the tongue in the morning, at noon, in the evening, and at bedtime.     clonazePAM (KLONOPIN) 1 MG tablet Take 1 mg by mouth 4 (four) times daily.     DULoxetine (CYMBALTA) 60 MG capsule Take 1 capsule (60 mg total) by mouth daily. 30 capsule 11   Fremanezumab-vfrm (AJOVY) 225 MG/1.5ML SOSY  INJECT 225 MG INTO THE SKIN EVERY 30 (THIRTY) DAYS. 1.5 mL 11   furosemide (LASIX) 20 MG tablet Take 20 mg by mouth 2 (two) times daily.     gabapentin (NEURONTIN) 400 MG capsule Take 400 mg by mouth 4 (four) times daily.     lithium carbonate 300 MG capsule Take 300 mg by mouth 2 (two) times daily with a meal.     Multiple Vitamins-Calcium (ONE-A-DAY WOMENS FORMULA PO) Take 1 tablet by mouth daily.     SUMAtriptan (IMITREX) 100 MG tablet May repeat in 2 hours if headache persists or recurs. Do no refill in less than 30 days 10 tablet 11   No current facility-administered medications for this visit.     Musculoskeletal: Strength & Muscle Tone: Virtual visit Gait & Station: Virtual visit Patient leans: N/A  Psychiatric Specialty Exam: Review of Systems  Psychiatric/Behavioral:  Positive for agitation and sleep disturbance. Negative for hallucinations, self-injury and suicidal ideas.   All other systems reviewed and are negative.  There were no vitals taken for this visit.There is no height or weight on file to calculate BMI.  General Appearance: NA  Eye Contact:  NA  Speech:  Clear and Coherent  Volume:  Normal  Mood:  Irritable  Affect:  NA  Thought Process:  Goal Directed  Orientation:  Full (Time, Place, and Person)  Thought Content: Logical   Suicidal Thoughts:   No  Homicidal Thoughts:  No  Memory:  Immediate;   Good Recent;   Good Remote;   Good  Judgement:  Good  Insight:  Good  Psychomotor Activity:  NA  Concentration:  Concentration: Poor and Attention Span: Poor  Recall:  NA  Fund of Knowledge: Good  Language: Good  Akathisia:  NA  Handed:  Right  AIMS (if indicated): not done  Assets:  Communication Skills Desire for Improvement  ADL's:  Intact  Cognition: WNL  Sleep:  fair   Screenings: AIMS    Flowsheet Row Clinical Support from 11/26/2019 in [facility] Admission (Discharged) from 04/10/2017 in [facility]  AIMS Total Score 5 0      GAD-7    Flowsheet Row Video Visit from 10/30/2020 in [facility] Video Visit from 08/11/2020 in [place] from 03/31/2020 in [facility]  Total GAD-7 Score 20 13 18       [place] Office Visit from 10/05/2016 in [facility]  Total Score (max 30 points ) 26      PHQ2-9    Flowsheet Row Nutrition [facility]29/2020 [facility] Video Visit from 08/11/2020 in [facility] ED from 08/05/2020 in [place] [facility]PT Clinical Support from 03/31/2020 in [facility] ED from 09/06/2019 in [facility]  PHQ-2 Total Score 3 4 4 6 1   PHQ-9 Total Score 18 15 8 19  --      Flowsheet Row ED from 09/18/2020 in [place] ED from 09/16/2020 in [place] Video Visit from 08/11/2020 in [place] No Risk No Risk No Risk        Assessment and Plan: Arti Trang is a 36 year old female presenting to [facility] behavioral health outpatient  for a follow up psychiatric evaluation, accompanied by  her mother.  Patient has a past psychiatric history of bipolar 1 disorder with psychotic features, borderline personality disorder, and social anxiety disorder.  Her symptoms are managed with Caplyta 42 mg daily, lithium 300 mg twice daily, trazodone 50 mg at bedtime, and hydroxyzine 25 mg 3 times daily as needed for anxiety.  Patient is also prescribed medications by alternate providers as follows: Cymbalta 60 mg daily, Subutex 4 mg sublingual tablet 4 times daily, Klonopin 1 mg 4 times daily, Cymbalta 60 mg daily, and gabapentin 400 mg 4 times daily. Patient reports medication compliance but continues to experience a anxiety and insomnia. Per her mother, patient is more stabilized in the past on this medication regimen. Patient requested medication increases on Trazodone and hydroxyzine to improve anxiety and insomnia. Patient provided medication education and instructed to return to care is symptoms of  serotonin syndrome develop (rapid heart rate, dilated pupils, muscle rigidity, sweating, diarrhea, headache, tremors, or fever). Patient and her mother acknowledged understanding of medication teaching.  Patient made aware to adjust dosages that cause drowsiness closer to bedtime if possible.  Trazodone increased to 100 mg at bedtime as needed for insomnia.  Hydroxyzine increased to 50 mg 3 times daily as needed for anxiety.  Lithium level ordered and patient agreed to visit [facility] to complete blood draw.  Patient noted with QT prolongation diagnosis and made aware that lithium is not recommended.  Patient made aware that provider will revisit the possibility of continuing lithium following the result of lithium level.  Caplyta reordered at 42 mg daily.    Collaboration of Care: Collaboration of Care: Medication Management AEB medications E scribed to patient's preferred pharmacy.  1. Bipolar I disorder, current or most recent episode manic, with  psychotic features ([place]) - lumateperone tosylate (CAPLYTA) 42 MG capsule; Take 1 capsule (42 mg total) by mouth daily.  Dispense: 30 capsule; Refill: 0 - Lithium level; Future  2. Social anxiety disorder increased- hydrOXYzine (VISTARIL) 50 MG capsule; Take 1 capsule (50 mg total) by mouth 3 (three) times daily as needed for anxiety.  Dispense: 30 capsule; Refill: 0    3. Insomnia related to another mental disorder increased- traZODone (DESYREL) 100 MG tablet; Take 1 tablet (100 mg total) by mouth at bedtime as needed for sleep.  Dispense: 30 tablet; Refill: 1  Return to care in 6 weeks  Patient/Guardian was advised Release of Information must be obtained prior to any record release in order to collaborate their care with an outside provider. Patient/Guardian was advised if they have not already done so to contact the registration department to sign all necessary forms in order for [place] to release information regarding their care.   Consent: Patient/Guardian gives verbal consent for treatment and assignment of benefits for services provided during this visit. Patient/Guardian expressed understanding and agreed to proceed.    Franne Grip, NP 04/21/2021, 4:03 PM

## 2021-04-22 ENCOUNTER — Ambulatory Visit: Payer: Medicaid Other | Attending: Internal Medicine

## 2021-04-22 ENCOUNTER — Other Ambulatory Visit: Payer: Self-pay

## 2021-04-22 DIAGNOSIS — R208 Other disturbances of skin sensation: Secondary | ICD-10-CM | POA: Diagnosis present

## 2021-04-22 DIAGNOSIS — M79671 Pain in right foot: Secondary | ICD-10-CM | POA: Diagnosis present

## 2021-04-22 DIAGNOSIS — R2681 Unsteadiness on feet: Secondary | ICD-10-CM | POA: Insufficient documentation

## 2021-04-22 DIAGNOSIS — M79672 Pain in left foot: Secondary | ICD-10-CM | POA: Insufficient documentation

## 2021-04-22 DIAGNOSIS — M6281 Muscle weakness (generalized): Secondary | ICD-10-CM | POA: Insufficient documentation

## 2021-04-22 NOTE — Therapy (Signed)
OUTPATIENT PHYSICAL THERAPY TREATMENT NOTE   Patient Name: Carmen Daniels MRN: [id number] DOB:12/16/1985, 36 y.o., female Today's Date: 04/22/2021  PCP: [name], MD REFERRING PROVIDER: [name], MD   PT End of Session - 04/22/21 1149     Visit Number 2    Number of Visits 17    Date for PT Re-Evaluation 06/12/21    Authorization Type [place] MCD    Authorization Time Period Pending auth    PT Start Time 1200    PT Stop Time 1243    PT Time Calculation (min) 43 min    Equipment Utilized During Treatment Gait belt    Activity Tolerance Patient tolerated treatment well    Behavior During Therapy [facility] for tasks assessed/performed             Past Medical History:  Diagnosis Date   Allergic rhinitis    Anxiety    Asthma    Bipolar disorder ([place])    Borderline personality disorder ([place])    Bupropion overdose    Chronic pain syndrome    Depression    Fibromyalgia    generalized   Hallucinations    Migraines    Nasal polyps    OCD (obsessive compulsive disorder)    Ovarian cyst    left   Pneumonia    2021   PONV (postoperative nausea and vomiting)    Schizophrenia ([place])    schizoaffective   Past Surgical History:  Procedure Laterality Date   ABDOMINAL HYSTERECTOMY     LAPAROSCOPIC TUBAL LIGATION Bilateral 06/13/2019   Procedure: LAPAROSCOPIC TUBAL LIGATION;  Surgeon: Chancy Milroy, MD;  Location: [place];  Service: Gynecology;  Laterality: Bilateral;  FILSHIE CLIPS   NASAL SINUS SURGERY     TUBAL LIGATION     tubes in ears     VAGINAL HYSTERECTOMY Bilateral 01/15/2020   Procedure: HYSTERECTOMY VAGINAL;  Surgeon: Chancy Milroy, MD;  Location: [place];  Service: Gynecology;  Laterality: Bilateral;   WISDOM TOOTH EXTRACTION     Patient Active Problem List   Diagnosis Date Noted   Intentional overdose ([place]) 02/07/2021   Weakness 08/19/2020   Gait abnormality 08/19/2020   Neuropathic pain 08/19/2020   Malnutrition of moderate degree 07/01/2020    Acute renal failure ([place]) 06/30/2020   Non-traumatic rhabdomyolysis    Elevated LFTs    Tardive dyskinesia 11/14/2019   Bipolar I disorder, most recent episode depressed ([place]) 09/13/2019   Social anxiety disorder 09/13/2019   Adjustment disorder with mixed anxiety and depressed mood 07/31/2019   Suicidal ideation    Post-operative state 07/19/2019   Seizures ([place]) 06/08/2019   Bipolar 1 disorder, depressed ([place]) 05/17/2019   Status epilepticus ([place]) 05/17/2019   Intentional benzodiazepine overdose ([place]) 03/29/2019   Intractable chronic 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 status migrainosus 02/08/2019   History of elective abortion 12/28/2018   QT prolongation 07/03/2018   AMS (altered mental status) 07/03/2018   Serum total bilirubin elevated 07/03/2018   Syncope 07/02/2018   Borderline personality disorder ([place]) 04/11/2017   Bipolar I disorder, current or most recent episode manic, with psychotic features ([place]) 04/10/2017   Bipolar disorder ([place]) 04/10/2017   Abnormal MRI of head 04/21/2016   Chronic migraine without aura 03/27/2015   Mild persistent asthma 12/30/2014   Allergic rhinitis due to pollen 12/30/2014   Rhinitis medicamentosa 12/30/2014   Nasal polyposis 12/30/2014    REFERRING DIAG: G60.9 (ICD-10-CM) - Hereditary and idiopathic neuropathy, unspecified  THERAPY DIAG:  Muscle  weakness (generalized)  Unsteadiness on feet  Pain in right foot  Pain in left foot  Other disturbances of skin sensation  PERTINENT HISTORY: Hx of Bipolar I disorder, anxiety, depression, borderline personality disorder, attempted suicide, rhabdomyolysis leading to LE peripheral nerve damage  PRECAUTIONS: None  SUBJECTIVE: My pain is doing better today.  PAIN:  Are you having pain? Yes NPRS scale: 4/10 Pain location: BIL calves, feet PAIN TYPE: aching and dull Pain description: intermittent  Aggravating factors: walking/ standing >5 minutes Relieving factors:  rest   OBJECTIVE:    DIAGNOSTIC FINDINGS: None current for primary problems   PATIENT SURVEYS:  LEFS Administer at treatment 1 04/22/2021 Lower Extremity Functional Score: 30 / 80 = 37.5 %   COGNITION:          Overall cognitive status: History of cognitive impairments - at baseline                      SENSATION:          Light touch: Deficits L5 dermatome 25% diminished on Lt   MUSCLE LENGTH: Hamstrings: WNL Thomas test: WNL BIL Gastroc and soleus: Severely limited BIL   POSTURE:  Forward posture when standing in FWW   PALPATION: Palpable and painful trigger points to BIL peroneal musculature, mild TTP to BIL plantar surface of feet, primarily medial arches   LE AROM/PROM:   A/PROM Right 04/17/2021 Left 04/17/2021  Ankle dorsiflexion -12 -8  Ankle plantarflexion      Ankle inversion      Ankle eversion       (Blank rows = not tested)   LE MMT:   MMT Right 04/17/2021 Left 04/17/2021  Hip flexion 4+/5 4+/5  Hip extension 3/5 3/5  Hip abduction 4/5 4/5  Hip internal rotation 4+/5 4+/5  Hip external rotation 4/5 4+/5  Knee flexion 4+/5 4+/5  Knee extension 4+/5 4+/5  Ankle dorsiflexion 4/5 4/5  Ankle plantarflexion 4/5 4/5  Ankle inversion 3/5 3/5  Ankle eversion 3/5 3/5   (Blank rows = not tested)     FUNCTIONAL TESTS:  5xSTS: 18 seconds with use of hands, unable to perform without hands Squat: 25% DL heel raise: able to complete 10 with decreased elevation   GAIT: Distance walked: 55ft Assistive device utilized: None Level of assistance: SBA Comments: Pt ambulates with decreased hip extension BIL and poor eccentric quad control as indicated by knee buckling during early and mid stance.       TODAY'S TREATMENT: [facility] Adult PT Treatment:                                                DATE: 04/22/2021 Therapeutic Exercise: Nustep level 3 x 5 mins while gathering subjective info Bridge 3" hold 2 x 10 Hooklying clamshell RTB 2 x 10 Hooklying hip  adduction ball squeeze 5" hold 2 x 10 Supine march x 10 BIL DKTC 2 x 30" Standing heel raises 2 x10 Mini squats 2 x 10 LAQ x 10  BIL Long sitting ankle PF/DF RTB x 10 BIL Ankle inversion/eversion RTB x 10 BIL  Self-Care: Instructed in use of ice for 10-15 minutes on R ankle when bothered by swelling   04/17/2021: Issued and demonstrated HEP     PATIENT EDUCATION:  Education details: Objective findings, prognosis, POC, and HEP Person educated: Patient and Caregiver  Education method: Explanation, Demonstration, and Handouts Education comprehension: verbalized understanding and returned demonstration     HOME EXERCISE PROGRAM: Access Code: [email or web address] URL: [email or web address] Date: 04/17/2021 Prepared by: Vanessa Centre   Exercises Supine Bridge - 1 x daily - 7 x weekly - 3 sets - 10 reps - 3-sec hold Sidelying Hip Abduction - 1 x daily - 7 x weekly - 3 sets - 10 reps - 3-sec hold Seated knee extension with slow lowering - 1 x daily - 7 x weekly - 3 sets - 10 reps - 3-sec hold Heel Raises with Counter Support - 1 x daily - 7 x weekly - 3 sets - 10 reps - 3-sec hold     ASSESSMENT:   CLINICAL IMPRESSION: Patient presents to PT with mild/moderate pain in bilateral calves and she reports HEP compliance. Her right ankle is slightly edematous and warm to the touch compared to the left. She exhibits good participation throughout session and is very motivated to return to prior level of function. Continue to focus on improving bilateral lower extremity strength, gait mechanics, and balance training. Patient continues to benefit from skilled PT services and should be progressed as able to improve functional independence.     OBJECTIVE IMPAIRMENTS Abnormal gait, decreased balance, decreased coordination, and decreased knowledge of use of DME.    ACTIVITY LIMITATIONS cleaning, community activity, driving, laundry, and shopping.    PERSONAL FACTORS Behavior pattern,  Past/current experiences, and 3+ comorbidities: see medical hx  are also affecting patient's functional outcome.      REHAB POTENTIAL: Good   CLINICAL DECISION MAKING: Unstable/unpredictable   EVALUATION COMPLEXITY: High     GOALS: Goals reviewed with patient? Yes   SHORT TERM GOALS:   STG Name Target Date Goal status  1 Pt will report understanding and adherence to her HEP in order to promote independence in the management of her primary impairments. Baseline: HEP provided at eval. 05/15/2021 INITIAL  2 Pt will achieve 10 full-height heel raises in order to reach into overhead cabinets without limitation. Baseline: x10 with 50% decreased heel height 05/15/2021 INITIAL  3 Pt will complete a 6MWT with supervision and no AD in order to progress to grocery shopping with less limitation. Baseline: Pt unable to walk more than 5 minutes (subjective report) due to fatigue. 05/15/2021 INITIAL    LONG TERM GOALS:    LTG Name Target Date Goal status  1 Pt will report ability to walk 20 minutes without AD in order to shop with her friends with less limitation. Baseline: Unable to walk >5 minutes due to fatigue 06/12/2021 INITIAL  2 Pt will achieve an LEFS score of ___ in order demonstrate improved functional ability as it relates to her primary impairments. Baseline: Administer at 1st treatment 04/22/2021 37.5% 06/12/2021 INITIAL  3 Pt will accomplish 5xSTS without UE support in under 13 seconds in order to demonstrate safe transfer ability. Baseline: 18 seconds with UE support 06/12/2021 INITIAL  4 Pt will achieve global BIL ankle strength of 4+/5 or greater in order to return to driving with less limitation. Baseline: See MMT chart 06/12/2021 INITIAL    PLAN: PT FREQUENCY: 2x/week   PT DURATION: 8 weeks   PLANNED INTERVENTIONS: Therapeutic exercises, Therapeutic activity, Neuro Muscular re-education, Balance training, Gait training, Patient/Family education, Joint mobilization, Stair training,  DME instructions, Aquatic Therapy, Dry Needling, Electrical stimulation, Cryotherapy, Moist heat, Manual lymph drainage, Compression bandaging, Taping, Vasopneumatic device, Biofeedback, and Manual therapy   PLAN FOR NEXT SESSION: gait and transfer  training, LE strengthening preferably in closed-chain    Evelene Croon, PTA 04/22/2021, 12:47 PM

## 2021-04-23 ENCOUNTER — Telehealth (HOSPITAL_COMMUNITY): Payer: Medicaid Other | Admitting: Psychiatry

## 2021-04-23 ENCOUNTER — Telehealth: Payer: Self-pay | Admitting: Neurology

## 2021-04-23 ENCOUNTER — Ambulatory Visit: Payer: Medicaid Other

## 2021-04-23 NOTE — Telephone Encounter (Signed)
Completed Medicaid Botox PA form. Faxed over with OV notes. ?

## 2021-04-24 ENCOUNTER — Other Ambulatory Visit (HOSPITAL_COMMUNITY): Payer: Self-pay | Admitting: *Deleted

## 2021-04-24 DIAGNOSIS — F312 Bipolar disorder, current episode manic severe with psychotic features: Secondary | ICD-10-CM

## 2021-04-25 LAB — LITHIUM LEVEL: Lithium Lvl: 0.4 mmol/L — ABNORMAL LOW (ref 0.5–1.2)

## 2021-04-27 ENCOUNTER — Ambulatory Visit (HOSPITAL_BASED_OUTPATIENT_CLINIC_OR_DEPARTMENT_OTHER): Payer: Medicaid Other | Attending: Internal Medicine | Admitting: Physical Therapy

## 2021-04-27 ENCOUNTER — Other Ambulatory Visit: Payer: Self-pay

## 2021-04-27 DIAGNOSIS — M79672 Pain in left foot: Secondary | ICD-10-CM | POA: Diagnosis present

## 2021-04-27 DIAGNOSIS — M79671 Pain in right foot: Secondary | ICD-10-CM | POA: Insufficient documentation

## 2021-04-27 DIAGNOSIS — R208 Other disturbances of skin sensation: Secondary | ICD-10-CM | POA: Diagnosis present

## 2021-04-27 DIAGNOSIS — M6281 Muscle weakness (generalized): Secondary | ICD-10-CM | POA: Insufficient documentation

## 2021-04-27 DIAGNOSIS — R2681 Unsteadiness on feet: Secondary | ICD-10-CM | POA: Insufficient documentation

## 2021-04-27 NOTE — Therapy (Signed)
OUTPATIENT PHYSICAL THERAPY TREATMENT NOTE   Patient Name: Carmen Daniels MRN: [id number] DOB:02/25/1985, 36 y.o., female Today's Date: 04/27/2021  PCP: [name], MD REFERRING PROVIDER: [name], MD   PT End of Session - 04/27/21 1004     Visit Number 3    Number of Visits 17    Date for PT Re-Evaluation 06/12/21    Authorization Type [place] [employer]    Authorization Time Period Pending auth    PT Start Time 0945    PT Stop Time 1030    PT Time Calculation (min) 45 min    Equipment Utilized During Treatment Gait belt    Activity Tolerance Patient tolerated treatment well    Behavior During Therapy [facility] for tasks assessed/performed             Past Medical History:  Diagnosis Date   Allergic rhinitis    Anxiety    Asthma    Bipolar disorder ([place])    Borderline personality disorder ([place])    Bupropion overdose    Chronic pain syndrome    Depression    Fibromyalgia    generalized   Hallucinations    Migraines    Nasal polyps    OCD (obsessive compulsive disorder)    Ovarian cyst    left   Pneumonia    2021   PONV (postoperative nausea and vomiting)    Schizophrenia ([place])    schizoaffective   Past Surgical History:  Procedure Laterality Date   ABDOMINAL HYSTERECTOMY     LAPAROSCOPIC TUBAL LIGATION Bilateral 06/13/2019   Procedure: LAPAROSCOPIC TUBAL LIGATION;  Surgeon: Chancy Milroy, MD;  Location: [place];  Service: Gynecology;  Laterality: Bilateral;  FILSHIE CLIPS   NASAL SINUS SURGERY     TUBAL LIGATION     tubes in ears     VAGINAL HYSTERECTOMY Bilateral 01/15/2020   Procedure: HYSTERECTOMY VAGINAL;  Surgeon: Chancy Milroy, MD;  Location: [place];  Service: Gynecology;  Laterality: Bilateral;   WISDOM TOOTH EXTRACTION     Patient Active Problem List   Diagnosis Date Noted   Intentional overdose ([place]) 02/07/2021   Weakness 08/19/2020   Gait abnormality 08/19/2020   Neuropathic pain 08/19/2020   Malnutrition of moderate degree 07/01/2020    Acute renal failure ([place]) 06/30/2020   Non-traumatic rhabdomyolysis    Elevated LFTs    Tardive dyskinesia 11/14/2019   Bipolar I disorder, most recent episode depressed ([place]) 09/13/2019   Social anxiety disorder 09/13/2019   Adjustment disorder with mixed anxiety and depressed mood 07/31/2019   Suicidal ideation    Post-operative state 07/19/2019   Seizures ([place]) 06/08/2019   Bipolar 1 disorder, depressed ([place]) 05/17/2019   Status epilepticus ([place]) 05/17/2019   Intentional benzodiazepine overdose ([place]) 03/29/2019   Intractable chronic 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 status migrainosus 02/08/2019   History of elective abortion 12/28/2018   QT prolongation 07/03/2018   AMS (altered mental status) 07/03/2018   Serum total bilirubin elevated 07/03/2018   Syncope 07/02/2018   Borderline personality disorder ([place]) 04/11/2017   Bipolar I disorder, current or most recent episode manic, with psychotic features ([place]) 04/10/2017   Bipolar disorder ([place]) 04/10/2017   Abnormal MRI of head 04/21/2016   Chronic migraine without aura 03/27/2015   Mild persistent asthma 12/30/2014   Allergic rhinitis due to pollen 12/30/2014   Rhinitis medicamentosa 12/30/2014   Nasal polyposis 12/30/2014    REFERRING DIAG: G60.9 (ICD-10-CM) - Hereditary and idiopathic neuropathy, unspecified  THERAPY DIAG:  Muscle  weakness (generalized)  Unsteadiness on feet  Pain in right foot  Pain in left foot  Other disturbances of skin sensation  PERTINENT HISTORY: Hx of Bipolar I disorder, anxiety, depression, borderline personality disorder, attempted suicide, rhabdomyolysis leading to LE peripheral nerve damage  PRECAUTIONS: None  SUBJECTIVE: "Not hurting much, pain better controlled"  PAIN:  Are you having pain? Yes NPRS scale: 2/10 current Pain location: BIL calves, feet PAIN TYPE: aching and dull Pain description: intermittent  Aggravating factors: walking/ standing >5 minutes Relieving  factors: rest   OBJECTIVE:    DIAGNOSTIC FINDINGS: None current for primary problems   PATIENT SURVEYS:  LEFS Administer at treatment 1 04/22/2021 Lower Extremity Functional Score: 30 / 80 = 37.5 %   COGNITION:          Overall cognitive status: History of cognitive impairments - at baseline                      SENSATION:          Light touch: Deficits L5 dermatome 25% diminished on Lt   MUSCLE LENGTH: Hamstrings: WNL Thomas test: WNL BIL Gastroc and soleus: Severely limited BIL   POSTURE:  Forward posture when standing in FWW   PALPATION: Palpable and painful trigger points to BIL peroneal musculature, mild TTP to BIL plantar surface of feet, primarily medial arches   LE AROM/PROM:   A/PROM Right 04/17/2021 Left 04/17/2021  Ankle dorsiflexion -12 -8  Ankle plantarflexion      Ankle inversion      Ankle eversion       (Blank rows = not tested)   LE MMT:   MMT Right 04/17/2021 Left 04/17/2021  Hip flexion 4+/5 4+/5  Hip extension 3/5 3/5  Hip abduction 4/5 4/5  Hip internal rotation 4+/5 4+/5  Hip external rotation 4/5 4+/5  Knee flexion 4+/5 4+/5  Knee extension 4+/5 4+/5  Ankle dorsiflexion 4/5 4/5  Ankle plantarflexion 4/5 4/5  Ankle inversion 3/5 3/5  Ankle eversion 3/5 3/5   (Blank rows = not tested)     FUNCTIONAL TESTS:  5xSTS: 18 seconds with use of hands, unable to perform without hands Squat: 25% DL heel raise: able to complete 10 with decreased elevation   GAIT: Distance walked: 7f Assistive device utilized: None Level of assistance: SBA Comments: Pt ambulates with decreased hip extension BIL and poor eccentric quad control as indicated by knee buckling during early and mid stance.       [facility]'S TREATMENT:   [facility]                                                [facility]: 04/27/2021 Therapeutic Exercise: Pt seen for aquatic therapy today.  Treatment took place in water 3.25-4.[facility] ft [facility]  Temp of water was 91.  Pt entered/exited the pool via stairs (step through pattern independently with bilat rail. Re into into setting  Walking Toe raises 4.8 ft on step Heel cord stretching R/L 4 x 30s Step ups leading R/L x10 ea Mini squats 2x10. Cues for weightbearing through heels for quad and glut activation. Balance ue support yellow hand buoys: feet together with vision; unsupported;VE supported; shoulder add/abd  -Tandem Leading R/L. Cues for core control. Increased difficulty maintaining balance with left foot forward. Side lunges width  of pool x 6 with ue support yellow hand buoys add/abd.  Pt requires buoyancy for support and to offload joints with strengthening exercises. Viscosity of the water is needed for resistance of strengthening; water current perturbations provides challenge to standing balance unsupported, requiring increased core activation.     [facility] Adult PT Treatment:                                                DATE: 04/22/2021 Therapeutic Exercise: Nustep level 3 x 5 mins while gathering subjective info Bridge 3" hold 2 x 10 Hooklying clamshell RTB 2 x 10 Hooklying hip adduction ball squeeze 5" hold 2 x 10 Supine march x 10 BIL DKTC 2 x 30" Standing heel raises 2 x10 Mini squats 2 x 10 LAQ x 10  BIL Long sitting ankle PF/DF RTB x 10 BIL Ankle inversion/eversion RTB x 10 BIL  Self-Care: Instructed in use of ice for 10-15 minutes on R ankle when bothered by swelling   04/17/2021: Issued and demonstrated HEP     PATIENT EDUCATION:  Education details: Objective findings, prognosis, POC, and HEP Person educated: Patient and Caregiver Education method: Explanation, Demonstration, and Handouts Education comprehension: verbalized understanding and returned demonstration     HOME EXERCISE PROGRAM: Access Code: [email or web address] URL: [email or web address] Date: 04/17/2021 Prepared by: Vanessa [place]   Exercises Supine Bridge - 1 x daily - 7 x  weekly - 3 sets - 10 reps - 3-sec hold Sidelying Hip Abduction - 1 x daily - 7 x weekly - 3 sets - 10 reps - 3-sec hold Seated knee extension with slow lowering - 1 x daily - 7 x weekly - 3 sets - 10 reps - 3-sec hold Heel Raises with Counter Support - 1 x daily - 7 x weekly - 3 sets - 10 reps - 3-sec hold     ASSESSMENT:   CLINICAL IMPRESSION: Pt presents at aquatic therapy with complaints of low pain 2/10, much improved since last episode.  She enters pool via steps and is directed through exercises in standing performed in 4.8 ft. Right ankle slightly edematous.  Focused on closed chain strengthening with instruction on abdominal bracing and glut tightening for core strengthening and improved posture.  She does fatigue fairly quickly but given rest periods she is able to continue tolerating 40 mins+ of aquatic intervention.  Of Note: gastroc shortening bilateral tolerated stretching with some discomfort. Attempted manual left toe extension stretching which she did not tolerate. She is an excellent candidate for aquatic therapy to improve functional mobility, strength balance and hasten progress towards goals.        OBJECTIVE IMPAIRMENTS Abnormal gait, decreased balance, decreased coordination, and decreased knowledge of use of DME.    ACTIVITY LIMITATIONS cleaning, community activity, driving, laundry, and shopping.    PERSONAL FACTORS Behavior pattern, Past/current experiences, and 3+ comorbidities: see medical hx  are also affecting patient's functional outcome.      REHAB POTENTIAL: Good   CLINICAL DECISION MAKING: Unstable/unpredictable   EVALUATION COMPLEXITY: High     GOALS: Goals reviewed with patient? Yes   SHORT TERM GOALS:   STG Name Target Date Goal status  1 Pt will report understanding and adherence to her HEP in order to promote independence in the management of her primary impairments. Baseline: HEP provided at eval. 05/15/2021 INITIAL  2 Pt will achieve  10  full-height heel raises in order to reach into overhead cabinets without limitation. Baseline: x10 with 50% decreased heel height 05/15/2021 INITIAL  3 Pt will complete a 6MWT with supervision and no AD in order to progress to grocery shopping with less limitation. Baseline: Pt unable to walk more than 5 minutes (subjective report) due to fatigue. 05/15/2021 INITIAL    LONG TERM GOALS:    LTG Name Target Date Goal status  1 Pt will report ability to walk 20 minutes without AD in order to shop with her friends with less limitation. Baseline: Unable to walk >5 minutes due to fatigue 06/12/2021 INITIAL  2 Pt will achieve an LEFS score of ___ in order demonstrate improved functional ability as it relates to her primary impairments. Baseline: Administer at 1st treatment 04/22/2021 37.5% 06/12/2021 INITIAL  3 Pt will accomplish 5xSTS without UE support in under 13 seconds in order to demonstrate safe transfer ability. Baseline: 18 seconds with UE support 06/12/2021 INITIAL  4 Pt will achieve global BIL ankle strength of 4+/5 or greater in order to return to driving with less limitation. Baseline: See MMT chart 06/12/2021 INITIAL    PLAN: PT FREQUENCY: 2x/week   PT DURATION: 8 weeks   PLANNED INTERVENTIONS: Therapeutic exercises, Therapeutic activity, Neuro Muscular re-education, Balance training, Gait training, Patient/Family education, Joint mobilization, Stair training, DME instructions, Aquatic Therapy, Dry Needling, Electrical stimulation, Cryotherapy, Moist heat, Manual lymph drainage, Compression bandaging, Taping, Vasopneumatic device, Biofeedback, and Manual therapy   PLAN FOR NEXT SESSION: gait and transfer training, LE strengthening preferably in closed-chain    Stanton Kidney ([place]) Mette [place] MPT 04/27/2021, 10:49 AM

## 2021-04-28 ENCOUNTER — Encounter: Payer: Medicaid Other | Admitting: Physical Therapy

## 2021-04-29 ENCOUNTER — Ambulatory Visit: Payer: Medicaid Other

## 2021-04-29 ENCOUNTER — Other Ambulatory Visit (HOSPITAL_COMMUNITY): Payer: Self-pay | Admitting: Psychiatry

## 2021-04-29 ENCOUNTER — Telehealth (HOSPITAL_COMMUNITY): Payer: Self-pay | Admitting: *Deleted

## 2021-04-29 MED ORDER — HYDROXYZINE PAMOATE 50 MG PO CAPS: 50.0000 mg | ORAL_CAPSULE | Freq: Three times a day (TID) | ORAL | 1 refills | Status: DC | PRN

## 2021-04-29 NOTE — Progress Notes (Signed)
Hydroxyzine ordered and escribed to pharmacy. ?

## 2021-04-29 NOTE — Telephone Encounter (Signed)
VM from patients mom, Arlie Solomons. She is questioning the frequency of patients Vistaril and the quantity ordered was only for 30 and her sig is for it to be taken three times a day. Also, patient went last week to get her Lithium level checked and she is not on lithium, she wanted to check on this, is she was supposed to be taking lithium.  Will reach out to [place] NP with these concerns for her to address, I will call mom back once I understand any interventions. ?

## 2021-04-30 ENCOUNTER — Ambulatory Visit: Payer: Medicaid Other

## 2021-04-30 ENCOUNTER — Other Ambulatory Visit (HOSPITAL_COMMUNITY): Payer: Self-pay | Admitting: Psychiatry

## 2021-04-30 NOTE — Progress Notes (Signed)
Spoke with patient's mother and Rheda does not want to continue Lithium because of required blood draws associated with this medication. Patient's symptoms continue to be stable per her mother. Lithium discontinued.  ?

## 2021-04-30 NOTE — Telephone Encounter (Signed)
Received (1) 200 unit vial of Botox from [facility]. ?

## 2021-05-02 NOTE — Therapy (Incomplete)
OUTPATIENT PHYSICAL THERAPY TREATMENT NOTE   Patient Name: Carmen Daniels MRN: [id number] DOB:06-20-1985, 36 y.o., female Today's Date: 05/02/2021  PCP: [name], MD REFERRING PROVIDER: [name], MD     Past Medical History:  Diagnosis Date   Allergic rhinitis    Anxiety    Asthma    Bipolar disorder ([place])    Borderline personality disorder ([place])    Bupropion overdose    Chronic pain syndrome    Depression    Fibromyalgia    generalized   Hallucinations    Migraines    Nasal polyps    OCD (obsessive compulsive disorder)    Ovarian cyst    left   Pneumonia    2021   PONV (postoperative nausea and vomiting)    Schizophrenia ([place])    schizoaffective   Past Surgical History:  Procedure Laterality Date   ABDOMINAL HYSTERECTOMY     LAPAROSCOPIC TUBAL LIGATION Bilateral 06/13/2019   Procedure: LAPAROSCOPIC TUBAL LIGATION;  Surgeon: Chancy Milroy, MD;  Location: [place];  Service: Gynecology;  Laterality: Bilateral;  FILSHIE CLIPS   NASAL SINUS SURGERY     TUBAL LIGATION     tubes in ears     VAGINAL HYSTERECTOMY Bilateral 01/15/2020   Procedure: HYSTERECTOMY VAGINAL;  Surgeon: Chancy Milroy, MD;  Location: [place];  Service: Gynecology;  Laterality: Bilateral;   WISDOM TOOTH EXTRACTION     Patient Active Problem List   Diagnosis Date Noted   Intentional overdose ([place]) 02/07/2021   Weakness 08/19/2020   Gait abnormality 08/19/2020   Neuropathic pain 08/19/2020   Malnutrition of moderate degree 07/01/2020   Acute renal failure ([place]) 06/30/2020   Non-traumatic rhabdomyolysis    Elevated LFTs    Tardive dyskinesia 11/14/2019   Bipolar I disorder, most recent episode depressed ([place]) 09/13/2019   Social anxiety disorder 09/13/2019   Adjustment disorder with mixed anxiety and depressed mood 07/31/2019   Suicidal ideation    Post-operative state 07/19/2019   Seizures ([place]) 06/08/2019   Bipolar 1 disorder, depressed ([place]) 05/17/2019   Status  epilepticus ([place]) 05/17/2019   Intentional benzodiazepine overdose ([place]) 03/29/2019   Intractable chronic 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 status migrainosus 02/08/2019   History of elective abortion 12/28/2018   QT prolongation 07/03/2018   AMS (altered mental status) 07/03/2018   Serum total bilirubin elevated 07/03/2018   Syncope 07/02/2018   Borderline personality disorder ([place]) 04/11/2017   Bipolar I disorder, current or most recent episode manic, with psychotic features ([place]) 04/10/2017   Bipolar disorder ([place]) 04/10/2017   Abnormal MRI of head 04/21/2016   Chronic migraine without aura 03/27/2015   Mild persistent asthma 12/30/2014   Allergic rhinitis due to pollen 12/30/2014   Rhinitis medicamentosa 12/30/2014   Nasal polyposis 12/30/2014    REFERRING DIAG: G60.9 (ICD-10-CM) - Hereditary and idiopathic neuropathy, unspecified  THERAPY DIAG:  No diagnosis found.  PERTINENT HISTORY: Hx of Bipolar I disorder, anxiety, depression, borderline personality disorder, attempted suicide, rhabdomyolysis leading to LE peripheral nerve damage  PRECAUTIONS: None  SUBJECTIVE: ***  PAIN:  Are you having pain? Yes NPRS scale: 2/10 current Pain location: BIL calves, feet PAIN TYPE: aching and dull Pain description: intermittent  Aggravating factors: walking/ standing >5 minutes Relieving factors: rest   OBJECTIVE:    DIAGNOSTIC FINDINGS: None current for primary problems   PATIENT SURVEYS:  LEFS Administer at treatment 1 04/22/2021 Lower Extremity Functional Score: 30 / 80 = 37.5 %   COGNITION:  Overall cognitive status: History of cognitive impairments - at baseline                      SENSATION:          Light touch: Deficits L5 dermatome 25% diminished on Lt   MUSCLE LENGTH: Hamstrings: WNL Thomas test: WNL BIL Gastroc and soleus: Severely limited BIL   POSTURE:  Forward posture when standing in FWW   PALPATION: Palpable and painful trigger points to  BIL peroneal musculature, mild TTP to BIL plantar surface of feet, primarily medial arches   LE AROM/PROM:   A/PROM Right 04/17/2021 Left 04/17/2021  Ankle dorsiflexion -12 -8  Ankle plantarflexion      Ankle inversion      Ankle eversion       (Blank rows = not tested)   LE MMT:   MMT Right 04/17/2021 Left 04/17/2021  Hip flexion 4+/5 4+/5  Hip extension 3/5 3/5  Hip abduction 4/5 4/5  Hip internal rotation 4+/5 4+/5  Hip external rotation 4/5 4+/5  Knee flexion 4+/5 4+/5  Knee extension 4+/5 4+/5  Ankle dorsiflexion 4/5 4/5  Ankle plantarflexion 4/5 4/5  Ankle inversion 3/5 3/5  Ankle eversion 3/5 3/5   (Blank rows = not tested)     FUNCTIONAL TESTS:  5xSTS: 18 seconds with use of hands, unable to perform without hands Squat: 25% DL heel raise: able to complete 10 with decreased elevation   GAIT: Distance walked: 5f Assistive device utilized: None Level of assistance: SBA Comments: Pt ambulates with decreased hip extension BIL and poor eccentric quad control as indicated by knee buckling during early and mid stance.       TOD[facility]'S TREATMENT:  [facility]                                                [facility]: 05/[facility]/2021 Therapeutic Exercise: *** Manual Therapy: *** Neuromuscular re-ed: *** Therapeutic Activity: *** Modalities: *** Self Care: *Hulan FessAdult PT Treatment:                                                DATE: 04/27/2021 Therapeutic Exercise: Pt seen for aquatic therapy today.  Treatment took place in water 3.25-4.8 ft [facility] Temp of water was 91.  Pt entered/exited the pool via stairs (step through pattern independently with bilat rail. Re into into setting  Walking Toe raises 4.8 ft on step Heel cord stretching R/L 4 x 30s Step ups leading R/L x10 ea Mini squats 2x10. Cues for weightbearing through heels for quad and glut activation. Balance ue support yellow hand buoys: feet together  with vision; unsupported;VE supported; shoulder add/abd  -Tandem Leading R/L. Cues for core control. Increased difficulty maintaining balance with left foot forward. Side lunges width of pool x 6 with ue support yellow hand buoys add/abd.  Pt requires buoyancy for support and to offload joints with strengthening exercises. Viscosity of the water is needed for resistance of strengthening; water current perturbations provides challenge to standing balance unsupported, requiring increased core activation.     [facility]  [facility]: 04/22/2021 Therapeutic Exercise: Nustep level 3 x 5 mins while gathering subjective info Bridge 3" hold 2 x 10 Hooklying clamshell RTB 2 x 10 Hooklying hip adduction ball squeeze 5" hold 2 x 10 Supine march x 10 BIL DKTC 2 x 30" Standing heel raises 2 x10 Mini squats 2 x 10 LAQ x 10  BIL Long sitting ankle PF/DF RTB x 10 BIL Ankle inversion/eversion RTB x 10 BIL  Self-Care: Instructed in use of ice for 10-15 minutes on R ankle when bothered by swelling   04/17/2021: Issued and demonstrated HEP     PATIENT EDUCATION:  Education details: Objective findings, prognosis, POC, and HEP Person educated: Patient and Caregiver Education method: Explanation, Demonstration, and Handouts Education comprehension: verbalized understanding and returned demonstration     HOME EXERCISE PROGRAM: Access Code: [email or web address] URL: [email or web address] Date: 04/17/2021 Prepared by: Vanessa [place]   Exercises Supine Bridge - 1 x daily - 7 x weekly - 3 sets - 10 reps - 3-sec hold Sidelying Hip Abduction - 1 x daily - 7 x weekly - 3 sets - 10 reps - 3-sec hold Seated knee extension with slow lowering - 1 x daily - 7 x weekly - 3 sets - 10 reps - 3-sec hold Heel Raises with Counter Support - 1 x daily - 7 x weekly - 3 sets - 10 reps - 3-sec hold     ASSESSMENT:   CLINICAL IMPRESSION: ***    OBJECTIVE  IMPAIRMENTS Abnormal gait, decreased balance, decreased coordination, and decreased knowledge of use of DME.    ACTIVITY LIMITATIONS cleaning, community activity, driving, laundry, and shopping.    PERSONAL FACTORS Behavior pattern, Past/current experiences, and 3+ comorbidities: see medical hx  are also affecting patient's functional outcome.      REHAB POTENTIAL: Good   CLINICAL DECISION MAKING: Unstable/unpredictable   EVALUATION COMPLEXITY: High     GOALS: Goals reviewed with patient? Yes   SHORT TERM GOALS:   STG Name Target Date Goal status  1 Pt will report understanding and adherence to her HEP in order to promote independence in the management of her primary impairments. Baseline: HEP provided at eval. 05/15/2021 INITIAL  2 Pt will achieve 10 full-height heel raises in order to reach into overhead cabinets without limitation. Baseline: x10 with 50% decreased heel height 05/15/2021 INITIAL  3 Pt will complete a 6MWT with supervision and no AD in order to progress to grocery shopping with less limitation. Baseline: Pt unable to walk more than 5 minutes (subjective report) due to fatigue. 05/15/2021 INITIAL    LONG TERM GOALS:    LTG Name Target Date Goal status  1 Pt will report ability to walk 20 minutes without AD in order to shop with her friends with less limitation. Baseline: Unable to walk >5 minutes due to fatigue 06/12/2021 INITIAL  2 Pt will achieve an LEFS score of ___ in order demonstrate improved functional ability as it relates to her primary impairments. Baseline: Administer at 1st treatment 04/22/2021 37.5% 06/12/2021 INITIAL  3 Pt will accomplish 5xSTS without UE support in under 13 seconds in order to demonstrate safe transfer ability. Baseline: 18 seconds with UE support 06/12/2021 INITIAL  4 Pt will achieve global BIL ankle strength of 4+/5 or greater in order to return to driving with less limitation. Baseline: See MMT chart 06/12/2021 INITIAL    PLAN: PT  FREQUENCY: 2x/week   PT DURATION: 8 weeks   PLANNED INTERVENTIONS: Therapeutic exercises, Therapeutic activity, Neuro Muscular re-education, Balance training, Gait  training, Patient/Family education, Joint mobilization, Stair training, DME instructions, Aquatic Therapy, Dry Needling, Electrical stimulation, Cryotherapy, Moist heat, Manual lymph drainage, Compression bandaging, Taping, Vasopneumatic device, Biofeedback, and Manual therapy   PLAN FOR NEXT SESSION: gait and transfer training, LE strengthening preferably in closed-chain   Vanessa [place], PT, DPT 05/02/21 9:23 AM

## 2021-05-05 ENCOUNTER — Ambulatory Visit: Payer: Medicaid Other

## 2021-05-07 ENCOUNTER — Ambulatory Visit: Payer: Medicaid Other | Admitting: Physical Therapy

## 2021-05-07 ENCOUNTER — Ambulatory Visit: Payer: Medicaid Other

## 2021-05-11 NOTE — Therapy (Incomplete)
?OUTPATIENT PHYSICAL THERAPY TREATMENT NOTE ? ? ?Patient Name: Carmen Daniels ?MRN: [id number] ?DOB:October 08, 1985, 36 y.o., female ?Today's Date: 05/11/2021 ? ?PCP: [name], MD ?REFERRING PROVIDER: [name], MD ? ? ? ? ?Past Medical History:  ?Diagnosis Date  ? Allergic rhinitis   ? Anxiety   ? Asthma   ? Bipolar disorder ([place])   ? Borderline personality disorder ([place])   ? Bupropion overdose   ? Chronic pain syndrome   ? Depression   ? Fibromyalgia   ? generalized  ? Hallucinations   ? Migraines   ? Nasal polyps   ? OCD (obsessive compulsive disorder)   ? Ovarian cyst   ? left  ? Pneumonia   ? 2021  ? PONV (postoperative nausea and vomiting)   ? Schizophrenia ([place])   ? schizoaffective  ? ?Past Surgical History:  ?Procedure Laterality Date  ? ABDOMINAL HYSTERECTOMY    ? LAPAROSCOPIC TUBAL LIGATION Bilateral 06/13/2019  ? Procedure: LAPAROSCOPIC TUBAL LIGATION;  Surgeon: Chancy Milroy, MD;  Location: [facility];  Service: Gynecology;  Laterality: Bilateral;  FILSHIE CLIPS  ? NASAL SINUS SURGERY    ? TUBAL LIGATION    ? tubes in ears    ? VAGINAL HYSTERECTOMY Bilateral 01/15/2020  ? Procedure: HYSTERECTOMY VAGINAL;  Surgeon: Chancy Milroy, MD;  Location: [facility];  Service: Gynecology;  Laterality: Bilateral;  ? WISDOM TOOTH EXTRACTION    ? ?Patient Active Problem List  ? Diagnosis Date Noted  ? Intentional overdose ([place]) 02/07/2021  ? Weakness 08/19/2020  ? Gait abnormality 08/19/2020  ? Neuropathic pain 08/19/2020  ? Malnutrition of moderate degree 07/01/2020  ? Acute renal failure ([place]) 06/30/2020  ? Non-traumatic rhabdomyolysis   ? Elevated LFTs   ? Tardive dyskinesia 11/14/2019  ? Bipolar I disorder, most recent episode depressed ([place]) 09/13/2019  ? Social anxiety disorder 09/13/2019  ? Adjustment disorder with mixed anxiety and depressed mood 07/31/2019  ? Suicidal ideation   ? Post-operative state 07/19/2019  ? Seizures ([place]) 06/08/2019  ? Bipolar 1 disorder, depressed ([place]) 05/17/2019  ? 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 Intentional benzodiazepine overdose ([place]) 03/29/2019  ? Intractable chronic 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 status migrainosus 02/08/2019  ? History of elective abortion 12/28/2018  ? QT prolongation 07/03/2018  ? AMS (altered mental status) 07/03/2018  ? Serum total bilirubin elevated 07/03/2018  ? Syncope 07/02/2018  ? Borderline personality disorder ([place]) 04/11/2017  ? Bipolar I disorder, current or most recent episode manic, with psychotic features ([place]) 04/10/2017  ? Bipolar disorder ([place]) 04/10/2017  ? Abnormal MRI of head 04/21/2016  ? Chronic migraine without aura 03/27/2015  ? Mild persistent asthma 12/30/2014  ? Allergic rhinitis due to pollen 12/30/2014  ? Rhinitis medicamentosa 12/30/2014  ? Nasal polyposis 12/30/2014  ? ? ?REFERRING DIAG: G60.9 (ICD-10-CM) - Hereditary and idiopathic neuropathy, unspecified ? ?THERAPY DIAG:  ?No diagnosis found. ? ?PERTINENT HISTORY: Hx of Bipolar I disorder, anxiety, depression, borderline personality disorder, attempted suicide, rhabdomyolysis leading to LE peripheral nerve damage ? ?PRECAUTIONS: None ? ?SUBJECTIVE: "Not hurting much, pain better controlled" ? ?PAIN:  ?Are you having pain? Yes ?NPRS scale: 2/10 current ?Pain location: BIL calves, feet ?PAIN TYPE: aching and dull ?Pain description: intermittent  ?Aggravating factors: walking/ standing >5 minutes ?Relieving factors: rest ? ? ?OBJECTIVE:  ?  ?DIAGNOSTIC FINDINGS: None current for primary problems ?  ?PATIENT SURVEYS:  ?LEFS Administer at treatment 1 ?04/22/2021 Lower Extremity Functional Score: 30 / 80 = 37.5 % ?  ?  COGNITION: ?         Overall cognitive status: History of cognitive impairments - at baseline            ?          ?SENSATION: ?         Light touch: Deficits L5 dermatome 25% diminished on Lt ?  ?MUSCLE LENGTH: ?Hamstrings: WNL ?Thomas test: WNL BIL ?Gastroc and soleus: Severely limited BIL ?  ?POSTURE:  ?Forward posture when standing in FWW ?   ?PALPATION: ?Palpable and painful trigger points to BIL peroneal musculature, mild TTP to BIL plantar surface of feet, primarily medial arches ?  ?LE AROM/PROM: ?  ?A/PROM Right ?04/17/2021 Left ?04/17/2021  ?Ankle dorsiflexion -12 -8  ?Ankle plantarflexion      ?Ankle inversion      ?Ankle eversion      ? (Blank rows = not tested) ?  ?LE MMT: ?  ?MMT Right ?04/17/2021 Left ?04/17/2021  ?Hip flexion 4+/5 4+/5  ?Hip extension 3/5 3/5  ?Hip abduction 4/5 4/5  ?Hip internal rotation 4+/5 4+/5  ?Hip external rotation 4/5 4+/5  ?Knee flexion 4+/5 4+/5  ?Knee extension 4+/5 4+/5  ?Ankle dorsiflexion 4/5 4/5  ?Ankle plantarflexion 4/5 4/5  ?Ankle inversion 3/5 3/5  ?Ankle eversion 3/5 3/5  ? (Blank rows = not tested) ?  ?  ?FUNCTIONAL TESTS:  ?5xSTS: 18 seconds with use of hands, unable to perform without hands ?Squat: 25% ?DL heel raise: able to complete 10 with decreased elevation ?  ?GAIT: ?Distance walked: 10f ?Assistive device utilized: None ?Level of assistance: SBA ?Comments: Pt ambulates with decreased hip extension BIL and poor eccentric quad control as indicated by knee buckling during early and mid stance. ?  ?  ?  ?TODAY'S TREATMENT: ? ? ?[facility]:                                                DATE: 04/27/2021 ?Therapeutic Exercise: ?Pt seen for aquatic therapy today.  Treatment took place in [facility] 3.25-4.[facility] Temp of [facility] was 91?.  Pt entered/exited the pool via stairs (step through pattern independently with bilat rail. ?Re into into setting ? ?Walking ?Toe raises 4.8 ft on step ?Heel cord stretching ?R/L 4 x 30s ?Step ups leading R/L x10 ea ?Mini squats 2x10. Cues for weightbearing through heels for quad and glut activation. ?Balance ue support yellow hand buoys: feet together with vision; unsupported;VE supported; shoulder add/abd ? -Tandem Leading R/L. Cues for core control. Increased difficulty maintaining balance with left foot forward. ?Side lunges  width of pool x 6 with ue support yellow hand buoys add/abd. ? ?Pt requires buoyancy for support and to offload joints with strengthening exercises. Viscosity of the water is needed for resistance of strengthening; water current perturbations provides challenge to standing balance unsupported, requiring increased core activation. ?  ? ? [facility]                                                [facility]: 04/[facility]/2021 ?Therapeutic Exercise: ?Nustep level 3 x 5 mins while gathering subjective info ?Bridge 3" hold 2 x 10 ?Hooklying clamshell RTB 2  x 10 ?Hooklying hip adduction ball squeeze 5" hold 2 x 10 ?Supine march x 10 BIL ?DKTC 2 x 30" ?Standing heel raises 2 x10 ?Mini squats 2 x 10 ?LAQ x 10  BIL ?Long sitting ankle PF/DF RTB x 10 BIL ?Ankle inversion/eversion RTB x 10 BIL  ?Self-Care: ?Instructed in use of ice for 10-15 minutes on R ankle when bothered by swelling ? ? ?04/17/2021: Issued and demonstrated HEP ?  ?  ?PATIENT EDUCATION:  ?Education details: Objective findings, prognosis, POC, and HEP ?Person educated: Patient and Caregiver ?Education method: Explanation, Demonstration, and Handouts ?Education comprehension: verbalized understanding and returned demonstration ?  ?  ?HOME EXERCISE PROGRAM: ?Access Code: [email or web address] ?URL: [email or web address] ?Date: 04/17/2021 ?Prepared by: Vanessa Amargosa ?  ?Exercises ?Supine Bridge - 1 x daily - 7 x weekly - 3 sets - 10 reps - 3-sec hold ?Sidelying Hip Abduction - 1 x daily - 7 x weekly - 3 sets - 10 reps - 3-sec hold ?Seated knee extension with slow lowering - 1 x daily - 7 x weekly - 3 sets - 10 reps - 3-sec hold ?Heel Raises with Counter Support - 1 x daily - 7 x weekly - 3 sets - 10 reps - 3-sec hold ?  ?  ?ASSESSMENT: ?  ?CLINICAL IMPRESSION: ?Pt presents at aquatic therapy with complaints of low pain 2/10, much improved since last episode.  She enters pool via steps and is directed through exercises in standing performed in 4.8 ft. Right  ankle slightly edematous.  Focused on closed chain strengthening with instruction on abdominal bracing and glut tightening for core strengthening and improved posture.  She does fatigue fairly quickly but given rest perio

## 2021-05-13 ENCOUNTER — Ambulatory Visit: Payer: Medicaid Other | Admitting: Physical Therapy

## 2021-05-15 ENCOUNTER — Ambulatory Visit: Payer: Medicaid Other

## 2021-05-19 NOTE — Therapy (Signed)
?OUTPATIENT PHYSICAL THERAPY TREATMENT NOTE ? ? ?Patient Name: Carmen Daniels ?MRN: [id number] ?DOB:10-27-1985, 36 y.o., female ?Today's Date: 05/20/2021 ? ?PCP: [name], MD ?REFERRING PROVIDER: [name], MD ? ? PT End of Session - 05/20/21 1726   ? ? Visit Number [phone]   ? Number of Visits 17   ? Date for PT Re-Evaluation 06/12/21   ? Authorization Type [place] [facility]   ? Authorization Time Period Pending auth   ? PT Start Time [phone]   ? PT Stop Time 0511   ? PT Time Calculation (min) 48 min   ? Activity Tolerance Patient tolerated treatment well   ? Behavior During Therapy [facility] for tasks assessed/performed   ? ?  ?  ? ?  ? ? ? ?Past Medical History:  ?Diagnosis Date  ? Allergic rhinitis   ? Anxiety   ? Asthma   ? Bipolar disorder ([place])   ? Borderline personality disorder ([place])   ? Bupropion overdose   ? Chronic pain syndrome   ? Depression   ? Fibromyalgia   ? generalized  ? Hallucinations   ? Migraines   ? Nasal polyps   ? OCD (obsessive compulsive disorder)   ? Ovarian cyst   ? left  ? Pneumonia   ? 2021  ? PONV (postoperative nausea and vomiting)   ? Schizophrenia ([place])   ? schizoaffective  ? ?Past Surgical History:  ?Procedure Laterality Date  ? ABDOMINAL HYSTERECTOMY    ? LAPAROSCOPIC TUBAL LIGATION Bilateral 06/13/2019  ? Procedure: LAPAROSCOPIC TUBAL LIGATION;  Surgeon: Chancy Milroy, MD;  Location: [facility];  Service: Gynecology;  Laterality: Bilateral;  FILSHIE CLIPS  ? NASAL SINUS SURGERY    ? TUBAL LIGATION    ? tubes in ears    ? VAGINAL HYSTERECTOMY Bilateral 01/15/2020  ? Procedure: HYSTERECTOMY VAGINAL;  Surgeon: Chancy Milroy, MD;  Location: [facility];  Service: Gynecology;  Laterality: Bilateral;  ? WISDOM TOOTH EXTRACTION    ? ?Patient Active Problem List  ? Diagnosis Date Noted  ? Intentional overdose ([place]) 02/07/2021  ? Weakness 08/19/2020  ? Gait abnormality 08/19/2020  ? Neuropathic pain 08/19/2020  ? Malnutrition of moderate degree 07/01/2020  ? Acute renal failure ([place]) 06/30/2020  ?  Non-traumatic rhabdomyolysis   ? Elevated LFTs   ? Tardive dyskinesia 11/14/2019  ? Bipolar I disorder, most recent episode depressed ([place]) 09/13/2019  ? Social anxiety disorder 09/13/2019  ? Adjustment disorder with mixed anxiety and depressed mood 07/31/2019  ? Suicidal ideation   ? Post-operative state 07/19/2019  ? Seizures ([place]) 06/08/2019  ? Bipolar 1 disorder, depressed ([place]) 05/17/2019  ? Status epilepticus ([place]) 05/17/2019  ? Intentional benzodiazepine overdose ([place]) 03/29/2019  ? Intractable chronic 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 status migrainosus 02/08/2019  ? History of elective abortion 12/28/2018  ? QT prolongation 07/03/2018  ? AMS (altered mental status) 07/03/2018  ? Serum total bilirubin elevated 07/03/2018  ? Syncope 07/02/2018  ? Borderline personality disorder ([place]) 04/11/2017  ? Bipolar I disorder, current or most recent episode manic, with psychotic features ([place]) 04/10/2017  ? Bipolar disorder ([place]) 04/10/2017  ? Abnormal MRI of head 04/21/2016  ? Chronic migraine without aura 03/27/2015  ? Mild persistent asthma 12/30/2014  ? Allergic rhinitis due to pollen 12/30/2014  ? Rhinitis medicamentosa 12/30/2014  ? Nasal polyposis 12/30/2014  ? ? ?REFERRING DIAG: G60.9 (ICD-10-CM) - Hereditary and idiopathic neuropathy, unspecified ? ?THERAPY DIAG:  ?Muscle weakness (generalized) ? ?Unsteadiness on feet ? ?Pain  in right foot ? ?Pain in left foot ? ?Other disturbances of skin sensation ? ?PERTINENT HISTORY: Hx of Bipolar I disorder, anxiety, depression, borderline personality disorder, attempted suicide, rhabdomyolysis leading to LE peripheral nerve damage ? ?PRECAUTIONS: None ? ?SUBJECTIVE: "Not hurting much, pain better controlled" ? ?PAIN:  ?Are you having pain? Yes ?NPRS scale: 2/10 current ?Pain location: BIL calves, feet ?PAIN TYPE: aching and dull ?Pain description: intermittent  ?Aggravating factors: walking/ standing >5 minutes ?Relieving factors: rest ? ? ?OBJECTIVE:  ?  ?DIAGNOSTIC  FINDINGS: None current for primary problems ?  ?PATIENT SURVEYS:  ?LEFS Administer at treatment 1 ?04/22/2021 Lower Extremity Functional Score: 30 / 80 = 37.5 % ?  ?COGNITION: ?         Overall cognitive status: History of cognitive impairments - at baseline            ?          ?SENSATION: ?         Light touch: Deficits L5 dermatome 25% diminished on Lt ?  ?MUSCLE LENGTH: ?Hamstrings: WNL ?Thomas test: WNL BIL ?Gastroc and soleus: Severely limited BIL ?  ?POSTURE:  ?Forward posture when standing in FWW ?  ?PALPATION: ?Palpable and painful trigger points to BIL peroneal musculature, mild TTP to BIL plantar surface of feet, primarily medial arches ?  ?LE AROM/PROM: ?  ?A/PROM Right ?04/17/2021 Left ?04/17/2021  ?Ankle dorsiflexion -12 -8  ?Ankle plantarflexion      ?Ankle inversion      ?Ankle eversion      ? (Blank rows = not tested) ?  ?LE MMT: ?  ?MMT Right ?04/17/2021 Left ?04/17/2021  ?Hip flexion 4+/5 4+/5  ?Hip extension 3/5 3/5  ?Hip abduction 4/5 4/5  ?Hip internal rotation 4+/5 4+/5  ?Hip external rotation 4/5 4+/5  ?Knee flexion 4+/5 4+/5  ?Knee extension 4+/5 4+/5  ?Ankle dorsiflexion 4/5 4/5  ?Ankle plantarflexion 4/5 4/5  ?Ankle inversion 3/5 3/5  ?Ankle eversion 3/5 3/5  ? (Blank rows = not tested) ?  ?  ?FUNCTIONAL TESTS:  ?5xSTS: 18 seconds with use of hands, unable to perform without hands ?Squat: 25% ?DL heel raise: able to complete 10 with decreased elevation ?  ?GAIT: ?Distance walked: 21f ?Assistive device utilized: None ?Level of assistance: SBA ?Comments: Pt ambulates with decreased hip extension BIL and poor eccentric quad control as indicated by knee buckling during early and mid stance. ?  ?  ?  ?TODAY'S TREATMENT: ? ?[facility]                                                DATE: 05-20-21 ?Therapeutic Exercise: ?Pt seen for aquatic therapy today.  Treatment took place in water 3.25-4.8 ft [facility] Temp of [facility] was 91?.  Pt entered/exited the  pool via stairs (step through pattern SBAx 1 by PT ? ?Walking to acclimate to pool forward , backward, side stepping, monster walks ?At edge of pool using 2 lb weight on R and L LE's ?Heel raises ?Squats ?Hip marching utilzing pool noodle for additonal resistance ?Heel cord stretching with great toe extended on pool wall 30 sec x 4 ? ?Balance UE support yellow aquatic DB: feet together with vision;and without vision   ? -Tandem Leading R/L. Cues for core control. Increased difficulty maintaining balance with left  foot forward. ? ?Supine abdominal hollowing with VC to keep hips up and feet up with  yellow barbells in UE submerged ?prone aquatic  plank with barbell submerged for abdominal engagement ?Combo prone to supine with legs extended for 3 minutes to encourage active abdominal engagement.  ?Swimming with freestyle , side stroke and back stroke 8 lengths of pool ? ?PT with distraction and mobilization of L great toe and 2,3,4,5 digits with pain at end range of Great toe mobs. Pt with increased tone of phalanges of Left foot ? ?  ?Pt requires buoyancy for support and to offload joints with strengthening exercises. Viscosity of the water is needed for resistance of strengthening; water current perturbations provides challenge to standing balance unsupported, requiring increased core activation. ?  ? ?[facility] Adult PT Treatment:                                                DATE: 04/27/2021 ?Therapeutic Exercise: ?Pt seen for aquatic therapy today.  Treatment took place in water 3.25-4.8 ft in depth at the [employer] pool. Temp of water was 91?.  Pt entered/exited the pool via stairs (step through pattern independently with bilat rail. ?Re into into setting ? ?Walking ?Toe raises 4.8 ft on step ?Heel cord stretching ?R/L 4 x 30s ?Step ups leading R/L x10 ea ?Mini squats 2x10. Cues for weightbearing through heels for quad and glut activation. ?Balance ue support yellow hand buoys: feet together with vision;  unsupported;VE supported; shoulder add/abd ? -Tandem Leading R/L. Cues for core control. Increased difficulty maintaining balance with left foot forward. ?Side lunges width of pool x 6 with ue support yellow hand

## 2021-05-20 ENCOUNTER — Encounter: Payer: Self-pay | Admitting: Physical Therapy

## 2021-05-20 ENCOUNTER — Ambulatory Visit: Payer: Medicaid Other | Admitting: Physical Therapy

## 2021-05-20 DIAGNOSIS — R2681 Unsteadiness on feet: Secondary | ICD-10-CM

## 2021-05-20 DIAGNOSIS — M6281 Muscle weakness (generalized): Secondary | ICD-10-CM | POA: Diagnosis not present

## 2021-05-20 DIAGNOSIS — M79672 Pain in left foot: Secondary | ICD-10-CM

## 2021-05-20 DIAGNOSIS — M79671 Pain in right foot: Secondary | ICD-10-CM

## 2021-05-20 DIAGNOSIS — R208 Other disturbances of skin sensation: Secondary | ICD-10-CM

## 2021-05-22 ENCOUNTER — Ambulatory Visit: Payer: Medicaid Other

## 2021-05-22 NOTE — Therapy (Incomplete)
?OUTPATIENT PHYSICAL THERAPY TREATMENT NOTE ? ? ?Patient Name: Carmen Daniels ?MRN: [id number] ?DOB:1985/09/07, 36 y.o., female ?Today's Date: 05/22/2021 ? ?PCP: [name], MD ?REFERRING PROVIDER: [name], MD ? ? ? ? ? ?Past Medical History:  ?Diagnosis Date  ? Allergic rhinitis   ? Anxiety   ? Asthma   ? Bipolar disorder ([place])   ? Borderline personality disorder ([place])   ? Bupropion overdose   ? Chronic pain syndrome   ? Depression   ? Fibromyalgia   ? generalized  ? Hallucinations   ? Migraines   ? Nasal polyps   ? OCD (obsessive compulsive disorder)   ? Ovarian cyst   ? left  ? Pneumonia   ? 2021  ? PONV (postoperative nausea and vomiting)   ? Schizophrenia ([place])   ? schizoaffective  ? ?Past Surgical History:  ?Procedure Laterality Date  ? ABDOMINAL HYSTERECTOMY    ? LAPAROSCOPIC TUBAL LIGATION Bilateral 06/13/2019  ? Procedure: LAPAROSCOPIC TUBAL LIGATION;  Surgeon: Chancy Milroy, MD;  Location: [facility];  Service: Gynecology;  Laterality: Bilateral;  FILSHIE CLIPS  ? NASAL SINUS SURGERY    ? TUBAL LIGATION    ? tubes in ears    ? VAGINAL HYSTERECTOMY Bilateral 01/15/2020  ? Procedure: HYSTERECTOMY VAGINAL;  Surgeon: Chancy Milroy, MD;  Location: [facility];  Service: Gynecology;  Laterality: Bilateral;  ? WISDOM TOOTH EXTRACTION    ? ?Patient Active Problem List  ? Diagnosis Date Noted  ? Intentional overdose ([place]) 02/07/2021  ? Weakness 08/19/2020  ? Gait abnormality 08/19/2020  ? Neuropathic pain 08/19/2020  ? Malnutrition of moderate degree 07/01/2020  ? Acute renal failure ([place]) 06/30/2020  ? Non-traumatic rhabdomyolysis   ? Elevated LFTs   ? Tardive dyskinesia 11/14/2019  ? Bipolar I disorder, most recent episode depressed ([place]) 09/13/2019  ? Social anxiety disorder 09/13/2019  ? Adjustment disorder with mixed anxiety and depressed mood 07/31/2019  ? Suicidal ideation   ? Post-operative state 07/19/2019  ? Seizures ([place]) 06/08/2019  ? Bipolar 1 disorder, depressed ([place]) 05/17/2019  ? 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 Intentional benzodiazepine overdose ([place]) 03/29/2019  ? Intractable chronic 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 status migrainosus 02/08/2019  ? History of elective abortion 12/28/2018  ? QT prolongation 07/03/2018  ? AMS (altered mental status) 07/03/2018  ? Serum total bilirubin elevated 07/03/2018  ? Syncope 07/02/2018  ? Borderline personality disorder ([place]) 04/11/2017  ? Bipolar I disorder, current or most recent episode manic, with psychotic features ([place]) 04/10/2017  ? Bipolar disorder ([place]) 04/10/2017  ? Abnormal MRI of head 04/21/2016  ? Chronic migraine without aura 03/27/2015  ? Mild persistent asthma 12/30/2014  ? Allergic rhinitis due to pollen 12/30/2014  ? Rhinitis medicamentosa 12/30/2014  ? Nasal polyposis 12/30/2014  ? ? ?REFERRING DIAG: G60.9 (ICD-10-CM) - Hereditary and idiopathic neuropathy, unspecified ? ?THERAPY DIAG:  ?No diagnosis found. ? ?PERTINENT HISTORY: Hx of Bipolar I disorder, anxiety, depression, borderline personality disorder, attempted suicide, rhabdomyolysis leading to LE peripheral nerve damage ? ?PRECAUTIONS: None ? ?SUBJECTIVE: *** ? ?PAIN:  ?Are you having pain? Yes ?NPRS scale: 2/10 current ?Pain location: BIL calves, feet ?PAIN TYPE: aching and dull ?Pain description: intermittent  ?Aggravating factors: walking/ standing >5 minutes ?Relieving factors: rest ? ? ?OBJECTIVE:  ? *Unless otherwise noted, objective information collected previously* ? ?DIAGNOSTIC FINDINGS: None current for primary problems ?  ?PATIENT SURVEYS:  ?LEFS Administer at treatment 1 ?04/22/2021 Lower Extremity Functional Score: 30 / 80 =  37.5 % ?  ?COGNITION: ?         Overall cognitive status: History of cognitive impairments - at baseline            ?          ?SENSATION: ?         Light touch: Deficits L5 dermatome 25% diminished on Lt ?  ?MUSCLE LENGTH: ?Hamstrings: WNL ?Thomas test: WNL BIL ?Gastroc and soleus: Severely limited BIL ?  ?POSTURE:  ?Forward posture when  standing in FWW ?  ?PALPATION: ?Palpable and painful trigger points to BIL peroneal musculature, mild TTP to BIL plantar surface of feet, primarily medial arches ?  ?LE AROM/PROM: ?  ?A/PROM Right ?04/17/2021 Left ?04/17/2021  ?Ankle dorsiflexion -12 -8  ?Ankle plantarflexion      ?Ankle inversion      ?Ankle eversion      ? (Blank rows = not tested) ?  ?LE MMT: ?  ?MMT Right ?04/17/2021 Left ?04/17/2021  ?Hip flexion 4+/5 4+/5  ?Hip extension 3/5 3/5  ?Hip abduction 4/5 4/5  ?Hip internal rotation 4+/5 4+/5  ?Hip external rotation 4/5 4+/5  ?Knee flexion 4+/5 4+/5  ?Knee extension 4+/5 4+/5  ?Ankle dorsiflexion 4/5 4/5  ?Ankle plantarflexion 4/5 4/5  ?Ankle inversion 3/5 3/5  ?Ankle eversion 3/5 3/5  ? (Blank rows = not tested) ?  ?  ?FUNCTIONAL TESTS:  ?5xSTS: 18 seconds with use of hands, unable to perform without hands ?Squat: 25% ?DL heel raise: able to complete 10 with decreased elevation ?  ?GAIT: ?Distance walked: 3f ?Assistive device utilized: None ?Level of assistance: SBA ?Comments: Pt ambulates with decreased hip extension BIL and poor eccentric quad control as indicated by knee buckling during early and mid stance. ?  ?  ?  ?TODAY'S TREATMENT: ? ?[facility]                                                DATE: 05/22/2021 ?Therapeutic Exercise: ?*** ?Manual Therapy: ?*** ?Neuromuscular re-ed: ?*** ?Therapeutic Activity: ?*** ?Modalities: ?*** ?Self Care: ?*** ? ? ?[facility]                                                DATE: 05-20-21 ?Therapeutic Exercise: ?Pt seen for aquatic therapy today.  Treatment took place in water 3.25-4.8 ft [facility] Temp of water was 91?.  Pt entered/exited the pool via stairs (step through pattern SBAx 1 by PT ? ?Walking to acclimate to pool forward , backward, side stepping, monster walks ?At edge of pool using 2 lb weight on R and L LE's ?Heel raises ?Squats ?Hip marching utilzing pool noodle for additonal  resistance ?Heel cord stretching with great toe extended on pool wall 30 sec x 4 ? ?Balance UE support yellow aquatic DB: feet together with vision;and without vision   ? -Tandem Leading R/L. Cues for core control. Increased difficulty maintaining balance with left foot forward. ? ?Supine abdominal hollowing with VC to keep hips up and feet up with  yellow barbells in UE submerged ?prone aquatic  plank with barbell submerged for abdominal engagement ?Combo prone to supine with legs extended for 3 minutes  to encourage active abdominal engagement.  ?Swimming with freestyle , side stroke and back stroke 8 lengths of pool ? ?PT with distraction and mobilization of L great toe and 2,3,4,5 digits with pain at end range of Great toe mobs. Pt with increased tone of phalanges of Left foot ? ?  ?Pt requires buoyancy for support and to offload joints with strengthening exercises. Viscosity of the water is needed for resistance of strengthening; water current perturbations provides challenge to standing balance unsupported, requiring increased core activation. ?  ? ?[facility] Adult PT Treatment:                                                DATE: 04/27/2021 ?Therapeutic Exercise: ?Pt seen for aquatic therapy today.  Treatment took place in water 3.25-4.8 ft in depth at the [employer] pool. Temp of water was 91?.  Pt entered/exited the pool via stairs (step through pattern independently with bilat rail. ?Re into into setting ? ?Walking ?Toe raises 4.8 ft on step ?Heel cord stretching ?R/L 4 x 30s ?Step ups leading R/L x10 ea ?Mini squats 2x10. Cues for weightbearing through heels for quad and glut activation. ?Balance ue support yellow hand buoys: feet together with vision; unsupported;VE supported; shoulder add/abd ? -Tandem Leading R/L. Cues for core control. Increased difficulty maintaining balance with left foot forward. ?Side lunges width of pool x 6 with ue support yellow hand buoys add/abd. ? ?Pt requires buoyancy for  support and to offload joints with strengthening exercises. Viscosity of the water is needed for resistance of strengthening; water current perturbations provides challenge to standing balance unsupported, requirin

## 2021-05-26 ENCOUNTER — Telehealth: Payer: Self-pay | Admitting: *Deleted

## 2021-05-26 ENCOUNTER — Telehealth (HOSPITAL_COMMUNITY): Payer: Self-pay | Admitting: *Deleted

## 2021-05-26 NOTE — Telephone Encounter (Signed)
Patients mom called requesting a new rx for her caplyta. She should be out per her chart. She has a return appt on the 12 th of April but is out now. Will forward request to the provider.  ?

## 2021-05-26 NOTE — Telephone Encounter (Signed)
Opened a second time in error. 

## 2021-05-26 NOTE — Telephone Encounter (Signed)
PA for Ajovy '225mg'$  started through [employer]. [place] Medicaid ID: [id number] R. Decision pending.  ?  ?

## 2021-05-26 NOTE — Telephone Encounter (Signed)
EJ#[id number] approved through 05/26/2022. ?

## 2021-05-27 ENCOUNTER — Telehealth (HOSPITAL_COMMUNITY): Payer: Self-pay | Admitting: *Deleted

## 2021-05-27 ENCOUNTER — Other Ambulatory Visit (HOSPITAL_COMMUNITY): Payer: Self-pay | Admitting: Psychiatry

## 2021-05-27 MED ORDER — CAPLYTA 42 MG PO CAPS: 42.0000 mg | ORAL_CAPSULE | Freq: Every day | ORAL | 0 refills | Status: DC

## 2021-05-27 NOTE — Telephone Encounter (Signed)
Call to inform patients Caplyta was called in, message left on patients VM ?

## 2021-05-27 NOTE — Progress Notes (Signed)
Caplyta refilled and e-scribed to pharmacy. ?

## 2021-06-02 ENCOUNTER — Ambulatory Visit (INDEPENDENT_AMBULATORY_CARE_PROVIDER_SITE_OTHER): Payer: Medicaid Other | Admitting: Psychiatry

## 2021-06-02 DIAGNOSIS — F3130 Bipolar disorder, current episode depressed, mild or moderate severity, unspecified: Secondary | ICD-10-CM | POA: Diagnosis not present

## 2021-06-02 DIAGNOSIS — F4010 Social phobia, unspecified: Secondary | ICD-10-CM | POA: Diagnosis not present

## 2021-06-02 DIAGNOSIS — F5105 Insomnia due to other mental disorder: Secondary | ICD-10-CM | POA: Diagnosis not present

## 2021-06-02 MED ORDER — TRAZODONE HCL 100 MG PO TABS: 100.0000 mg | ORAL_TABLET | Freq: Every evening | ORAL | 2 refills | Status: DC | PRN

## 2021-06-02 MED ORDER — CAPLYTA 42 MG PO CAPS: 42.0000 mg | ORAL_CAPSULE | Freq: Every day | ORAL | 2 refills | Status: DC

## 2021-06-02 MED ORDER — HYDROXYZINE PAMOATE 50 MG PO CAPS: 50.0000 mg | ORAL_CAPSULE | Freq: Three times a day (TID) | ORAL | 2 refills | Status: DC | PRN

## 2021-06-02 NOTE — Progress Notes (Signed)
BH MD/PA/NP OP Progress Note ? ?06/02/2021 6:25 PM ?Carmen Daniels  ?MRN:  [id number] ? ?Virtual Visit via Telephone Note ? ?I connected with Carmen Daniels on 06/02/21 at  3:30 PM EDT by telephone and verified that I am speaking with the correct person using two identifiers. ? ?Location: ?Patient: Home ?Provider: Offsite ?  ?I discussed the limitations, risks, security and privacy concerns of performing an evaluation and management service by telephone and the availability of in person appointments. I also discussed with the patient that there may be a patient responsible charge related to this service. The patient expressed understanding and agreed to proceed. ? ? ?  ?I discussed the assessment and treatment plan with the patient. The patient was provided an opportunity to ask questions and all were answered. The patient agreed with the plan and demonstrated an understanding of the instructions. ?  ?The patient was advised to call back or seek an in-person evaluation if the symptoms worsen or if the condition fails to improve as anticipated. ? ?I provided 10 minutes of non-face-to-face time during this encounter. ? ? ?Franne Grip, NP  ? ?Chief Complaint: Medication management ? ?HPI: Carmen Daniels is a 36 year old female presenting to [facility] behavioral health outpatient for follow-up psychiatric evaluation.  Patient has a psychiatric history of bipolar disorder with psychotic features, borderline personality disorder and social anxiety disorder.  Patient symptoms are managed with Caplyta 42 mg daily, trazodone 50 mg at bedtime, hydroxyzine 25 mg 3 times daily as needed for anxiety.  Patient reports that medications are effective with managing her symptoms and reports that she is medication compliant.  Patient denies adverse medication effects or need for dosage adjustment today.  No medication changes today. ? ? ? ? ?Visit Diagnosis:  ?  ICD-10-CM   ?1. Bipolar I disorder, most recent episode depressed ([place])   F31.30   ?  ?2. Insomnia related to another mental disorder  F51.05   ?  ?3. Social anxiety disorder  F40.10   ?  ? ? ?Past Psychiatric History:  ? ?Past Medical History:  ?Past Medical History:  ?Diagnosis Date  ? Allergic rhinitis   ? Anxiety   ? Asthma   ? Bipolar disorder ([place])   ? Borderline personality disorder ([place])   ? Bupropion overdose   ? Chronic pain syndrome   ? Depression   ? Fibromyalgia   ? generalized  ? Hallucinations   ? Migraines   ? Nasal polyps   ? OCD (obsessive compulsive disorder)   ? Ovarian cyst   ? left  ? Pneumonia   ? 2021  ? PONV (postoperative nausea and vomiting)   ? Schizophrenia ([place])   ? schizoaffective  ?  ?Past Surgical History:  ?Procedure Laterality Date  ? ABDOMINAL HYSTERECTOMY    ? LAPAROSCOPIC TUBAL LIGATION Bilateral 06/13/2019  ? Procedure: LAPAROSCOPIC TUBAL LIGATION;  Surgeon: Chancy Milroy, MD;  Location: [place];  Service: Gynecology;  Laterality: Bilateral;  FILSHIE CLIPS  ? NASAL SINUS SURGERY    ? TUBAL LIGATION    ? tubes in ears    ? VAGINAL HYSTERECTOMY Bilateral 01/15/2020  ? Procedure: HYSTERECTOMY VAGINAL;  Surgeon: Chancy Milroy, MD;  Location: [place];  Service: Gynecology;  Laterality: Bilateral;  ? WISDOM TOOTH EXTRACTION    ? ? ?Family Psychiatric History: See below ? ?Family History:  ?Family History  ?Problem Relation Age of Onset  ? Healthy Mother   ? Healthy Father   ?  Arthritis Maternal Grandmother   ? Arthritis Maternal Grandfather   ? Depression Paternal Grandmother   ? Hyperlipidemia Other   ? Stroke Other   ? Hypertension Other   ? Cancer Other   ? COPD Other   ? Asthma Other   ? ? ?Social History:  ?Social History  ? ?Socioeconomic History  ? Marital status: Married  ?  Spouse name: Not on file  ? Number of children: 0  ? Years of education: 68  ? Highest education level: Not on file  ?Occupational History  ? Occupation: Unemployed  ?Tobacco Use  ? Smoking status: Every Day  ?  Packs/day: 0.50  ?  Years: 15.00  ?  Pack  years: 7.50  ?  Types: Cigarettes  ? Smokeless tobacco: Never  ?Vaping Use  ? Vaping Use: Every day  ? Substances: Nicotine  ?Substance and Sexual Activity  ? Alcohol use: Never  ? Drug use: Yes  ?  Comment: prescribed narcotics  ? Sexual activity: Not on file  ?Other Topics Concern  ? Not on file  ?Social History Narrative  ? Lives with parents.  ? Right-handed.  ? 3 glasses of green tea per day.  ? ?Social Determinants of Health  ? ?Financial Resource Strain: Not on file  ?Food Insecurity: No Food Insecurity  ? Worried About Charity fundraiser in the Last Year: Never true  ? Ran Out of Food in the Last Year: Never true  ?Transportation Needs: Not on file  ?Physical Activity: Not on file  ?Stress: Not on file  ?Social Connections: Not on file  ? ? ?Allergies:  ?Allergies  ?Allergen Reactions  ? Keflex [Cephalexin] Anaphylaxis  ?  Has tolerated amoxicillin 2019/2020  ? ? ?Metabolic Disorder Labs: ?Lab Results  ?Component Value Date  ? HGBA1C 4.7 (L) 02/07/2021  ? MPG 88.19 02/07/2021  ? ?No results found for: PROLACTIN ?Lab Results  ?Component Value Date  ? TRIG 341 (H) 04/03/2019  ? ?Lab Results  ?Component Value Date  ? TSH 0.972 02/15/2021  ? TSH 4.304 07/23/2020  ? ? ?Therapeutic Level Labs: ?Lab Results  ?Component Value Date  ? LITHIUM 0.4 (L) 04/24/2021  ? LITHIUM <0.06 (L) 07/30/2019  ? ?No results found for: VALPROATE ?No components found for:  CBMZ ? ?Current Medications: ?Current Outpatient Medications  ?Medication Sig Dispense Refill  ? beclomethasone (QVAR) 40 MCG/ACT inhaler Inhale 1 puff into the lungs 2 (two) times daily as needed (asthma).     ? botulinum toxin Type A (BOTOX) 100 units SOLR injection Inject 200 Units into the muscle every 3 (three) months. Inject into head and neck muscles 2 each 3  ? buprenorphine (SUBUTEX) 2 MG SUBL SL tablet Place 4 mg under the tongue in the morning, at noon, in the evening, and at bedtime.    ? clonazePAM (KLONOPIN) 1 MG tablet Take 1 mg by mouth 4 (four)  times daily.    ? DULoxetine (CYMBALTA) 60 MG capsule Take 1 capsule (60 mg total) by mouth daily. 30 capsule 11  ? Fremanezumab-vfrm (AJOVY) 225 MG/1.5ML SOSY INJECT 225 MG INTO THE SKIN EVERY 30 (THIRTY) DAYS. 1.5 mL 11  ? gabapentin (NEURONTIN) 400 MG capsule Take 400 mg by mouth 4 (four) times daily.    ? hydrOXYzine (VISTARIL) 50 MG capsule Take 1 capsule (50 mg total) by mouth 3 (three) times daily as needed for anxiety. 90 capsule 2  ? lumateperone tosylate (CAPLYTA) 42 MG capsule Take 1 capsule (42 mg total)  by mouth daily. 30 capsule 2  ? Multiple Vitamins-Calcium (ONE-A-DAY WOMENS FORMULA PO) Take 1 tablet by mouth daily.    ? SUMAtriptan (IMITREX) 100 MG tablet May repeat in 2 hours if headache persists or recurs. Do no refill in less than 30 days 10 tablet 11  ? traZODone (DESYREL) 100 MG tablet Take 1 tablet (100 mg total) by mouth at bedtime as needed for sleep. 30 tablet 2  ? ?No current facility-administered medications for this visit.  ? ? ? ?Musculoskeletal: ?Strength & Muscle Tone: Virtual visit ?Gait & Station: N/A virtual visit ?Patient leans: N/A ? ?Psychiatric Specialty Exam: ?Review of Systems  ?Psychiatric/Behavioral:  Negative for hallucinations, self-injury and suicidal ideas.   ?All other systems reviewed and are negative.  ?There were no vitals taken for this visit.There is no height or weight on file to calculate BMI.  ?General Appearance: NA  ?Eye Contact:  NA  ?Speech:  Clear and Coherent  ?Volume:  Normal  ?Mood:  Euthymic  ?Affect:  NA  ?Thought Process:  Goal Directed  ?Orientation:  Full (Time, Place, and Person)  ?Thought Content: Logical   ?Suicidal Thoughts:  No  ?Homicidal Thoughts:  No  ?Memory: Good  ?Judgement:  Good  ?Insight:  Good  ?Psychomotor Activity:  NA  ?Concentration: Good  ?Recall:  Good  ?Fund of Knowledge: Good  ?Language: Good  ?Akathisia:  NA  ?Handed:  Right  ?AIMS (if indicated): done  ?Assets:  Communication Skills  ?ADL's:  Intact  ?Cognition: WNL   ?Sleep:  Good  ? ?Screenings: ?AIMS   ? ?Flowsheet Row Clinical Support from 11/26/2019 in [facility] Admission (Discharged) from 04/10/2017 in [place]

## 2021-06-05 ENCOUNTER — Ambulatory Visit: Payer: Medicaid Other

## 2021-06-13 NOTE — Therapy (Signed)
?OUTPATIENT PHYSICAL THERAPY TREATMENT NOTE ? ? ?Patient Name: Carmen Daniels ?MRN: [id number] ?DOB:19-Sep-1985, 36 y.o., female ?Today's Date: 06/13/2021 ? ?PCP: [name], MD ?REFERRING PROVIDER: [name], MD ? ? ? ? ? ?Past Medical History:  ?Diagnosis Date  ? Allergic rhinitis   ? Anxiety   ? Asthma   ? Bipolar disorder ([place])   ? Borderline personality disorder ([place])   ? Bupropion overdose   ? Chronic pain syndrome   ? Depression   ? Fibromyalgia   ? generalized  ? Hallucinations   ? Migraines   ? Nasal polyps   ? OCD (obsessive compulsive disorder)   ? Ovarian cyst   ? left  ? Pneumonia   ? 2021  ? PONV (postoperative nausea and vomiting)   ? Schizophrenia ([place])   ? schizoaffective  ? ?Past Surgical History:  ?Procedure Laterality Date  ? ABDOMINAL HYSTERECTOMY    ? LAPAROSCOPIC TUBAL LIGATION Bilateral 06/13/2019  ? Procedure: LAPAROSCOPIC TUBAL LIGATION;  Surgeon: Chancy Milroy, MD;  Location: [place];  Service: Gynecology;  Laterality: Bilateral;  FILSHIE CLIPS  ? NASAL SINUS SURGERY    ? TUBAL LIGATION    ? tubes in ears    ? VAGINAL HYSTERECTOMY Bilateral 01/15/2020  ? Procedure: HYSTERECTOMY VAGINAL;  Surgeon: Chancy Milroy, MD;  Location: [place];  Service: Gynecology;  Laterality: Bilateral;  ? WISDOM TOOTH EXTRACTION    ? ?Patient Active Problem List  ? Diagnosis Date Noted  ? Intentional overdose ([place]) 02/07/2021  ? Weakness 08/19/2020  ? Gait abnormality 08/19/2020  ? Neuropathic pain 08/19/2020  ? Malnutrition of moderate degree 07/01/2020  ? Acute renal failure ([place]) 06/30/2020  ? Non-traumatic rhabdomyolysis   ? Elevated LFTs   ? Tardive dyskinesia 11/14/2019  ? Bipolar I disorder, most recent episode depressed ([place]) 09/13/2019  ? Social anxiety disorder 09/13/2019  ? Adjustment disorder with mixed anxiety and depressed mood 07/31/2019  ? Suicidal ideation   ? Post-operative state 07/19/2019  ? Seizures ([place]) 06/08/2019  ? Bipolar 1 disorder, depressed ([place]) 05/17/2019  ? 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 Intentional benzodiazepine overdose ([place]) 03/29/2019  ? Intractable chronic 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 status migrainosus 02/08/2019  ? History of elective abortion 12/28/2018  ? QT prolongation 07/03/2018  ? AMS (altered mental status) 07/03/2018  ? Serum total bilirubin elevated 07/03/2018  ? Syncope 07/02/2018  ? Borderline personality disorder ([place]) 04/11/2017  ? Bipolar I disorder, current or most recent episode manic, with psychotic features ([place]) 04/10/2017  ? Bipolar disorder ([place]) 04/10/2017  ? Abnormal MRI of head 04/21/2016  ? Chronic migraine without aura 03/27/2015  ? Mild persistent asthma 12/30/2014  ? Allergic rhinitis due to pollen 12/30/2014  ? Rhinitis medicamentosa 12/30/2014  ? Nasal polyposis 12/30/2014  ? ? ?REFERRING DIAG: G60.9 (ICD-10-CM) - Hereditary and idiopathic neuropathy, unspecified ? ?THERAPY DIAG:  ?No diagnosis found. ? ?PERTINENT HISTORY: Hx of Bipolar I disorder, anxiety, depression, borderline personality disorder, attempted suicide, rhabdomyolysis leading to LE peripheral nerve damage ? ?PRECAUTIONS: None ? ?SUBJECTIVE: Pt reports low-level BIL feet pain today. She reports that she is saddened today after her friend committed suicide last week. She reports being ready to go through her exercises session today.  ? ?PAIN:  ?Are you having pain? Yes ?NPRS scale: 2/10 current ?Pain location: BIL calves, feet ?PAIN TYPE: aching and dull ?Pain description: intermittent  ?Aggravating factors: walking/ standing >5 minutes ?Relieving factors: rest ? ? ?OBJECTIVE:  ? *Unless otherwise  noted, objective information collected previously* ? ?DIAGNOSTIC FINDINGS: None current for primary problems ?  ?PATIENT SURVEYS:  ?LEFS Administer at treatment 1 ?04/22/2021 Lower Extremity Functional Score: 30 / 80 = 37.5 % ?06/16/2021: LEFS: 42/80, 52.5% ?  ?COGNITION: ?         Overall cognitive status: History of cognitive impairments - at baseline            ?           ?SENSATION: ?         Light touch: Deficits L5 dermatome 25% diminished on Lt ?  ?MUSCLE LENGTH: ?Hamstrings: WNL ?Thomas test: WNL BIL ?Gastroc and soleus: Severely limited BIL ?  ?POSTURE:  ?Forward posture when standing in FWW ?  ?PALPATION: ?Palpable and painful trigger points to BIL peroneal musculature, mild TTP to BIL plantar surface of feet, primarily medial arches ?  ?LE AROM/PROM: ?  ?A/PROM Right ?04/17/2021 Left ?04/17/2021 Right ?06/16/2021 Left ?06/16/2021  ?Ankle dorsiflexion -12 -8 -1/3 2/5  ?Ankle plantarflexion     60 AROM 60 AROM  ?Ankle inversion     26 AROM 42 AROM  ?Ankle eversion     30 AROM 17 AROM  ? (Blank rows = not tested) ?  ?LE MMT: ?  ?MMT Right ?04/17/2021 Left ?04/17/2021 Right ?06/16/2021 Left ?06/16/2021  ?Hip flexion 4+/5 4+/5 5/5 5/5  ?Hip extension 3/5 3/5 4+/5 4+/5  ?Hip abduction 4/5 4/5 5/5 4+/5  ?Hip internal rotation 4+/5 4+/5 5/5 4+/5  ?Hip external rotation 4/5 4+/5 5/5 5/5  ?Knee flexion 4+/5 4+/5 5/5 4+/5  ?Knee extension 4+/5 4+/5 5/5 5/5  ?Ankle dorsiflexion 4/5 4/5 4+/5 4+/5  ?Ankle plantarflexion 4/5 4/5 5/5 5/5  ?Ankle inversion 3/5 3/5 4/5 4/5  ?Ankle eversion 3/5 3/5 4/5 4/5  ? (Blank rows = not tested) ?  ?  ?FUNCTIONAL TESTS:  ?5xSTS: 18 seconds with use of hands, unable to perform without hands ?Squat: 25% ?DL heel raise: able to complete 10 with decreased elevation ? ?06/16/2021: 18 seconds with no hands ?  ?GAIT: ?Distance walked: 63f ?Assistive device utilized: None ?Level of assistance: SBA ?Comments: Pt ambulates with decreased hip extension BIL and poor eccentric quad control as indicated by knee buckling during early and mid stance. ?  ?  ?  ?TODAY'S TREATMENT: ? ?[facility]                                                DATE: 06/16/2021/2023 ?Therapeutic Exercise: ?Standing gastroc stretch at wall x1 min BIL ?Seated ankle eversion/ inversion with towel and 3# dumbbells x3 each BIL ?Dead lift with 10# kettlebell 2x8 ?Manual  Therapy: ?N/A ?Neuromuscular re-ed: ?N/A ?Therapeutic Activity: ?Re-assessment of objective measures with pt education on progress made in PT ?Re-administration of LEFS ?Modalities: ?N/A ?Self Care: ?N/A ? ? ?[facility]                                                DATE: 05-20-21 ?Therapeutic Exercise: ?Pt seen for aquatic therapy today.  Treatment took place in water 3.25-4.8 ft [facility] Temp of water was 91?.  Pt entered/exited the pool via stairs (step through pattern  SBAx 1 by PT ? ?Walking to acclimate to pool forward , backward, side stepping, monster walks ?At edge of pool using 2 lb weight on R and L LE's ?Heel raises ?Squats ?Hip marching utilzing pool noodle for additonal resistance ?Heel cord stretching with great toe extended on pool wall 30 sec x 4 ? ?Balance UE support yellow aquatic DB: feet together with vision;and without vision   ? -Tandem Leading R/L. Cues for core control. Increased difficulty maintaining balance with left foot forward. ? ?Supine abdominal hollowing with VC to keep hips up and feet up with  yellow barbells in UE submerged ?prone aquatic  plank with barbell submerged for abdominal engagement ?Combo prone to supine with legs extended for 3 minutes to encourage active abdominal engagement.  ?Swimming with freestyle , side stroke and back stroke 8 lengths of pool ? ?PT with distraction and mobilization of L great toe and 2,3,4,5 digits with pain at end range of Great toe mobs. Pt with increased tone of phalanges of Left foot ? ?  ?Pt requires buoyancy for support and to offload joints with strengthening exercises. Viscosity of the water is needed for resistance of strengthening; water current perturbations provides challenge to standing balance unsupported, requiring increased core activation. ?  ? ?[facility] Adult PT Treatment:                                                DATE: 04/27/2021 ?Therapeutic Exercise: ?Pt seen for aquatic therapy  today.  Treatment took place in water 3.25-4.8 ft in depth at the [facility] pool. Temp of water was 91?.  Pt entered/exited the pool via stairs (step through pattern independently with bilat rail. ?Re into into

## 2021-06-16 ENCOUNTER — Ambulatory Visit: Payer: Medicaid Other | Attending: Internal Medicine

## 2021-06-16 DIAGNOSIS — M79671 Pain in right foot: Secondary | ICD-10-CM | POA: Diagnosis present

## 2021-06-16 DIAGNOSIS — R208 Other disturbances of skin sensation: Secondary | ICD-10-CM | POA: Diagnosis present

## 2021-06-16 DIAGNOSIS — M6281 Muscle weakness (generalized): Secondary | ICD-10-CM | POA: Diagnosis present

## 2021-06-16 DIAGNOSIS — M79672 Pain in left foot: Secondary | ICD-10-CM | POA: Diagnosis present

## 2021-06-16 DIAGNOSIS — R2681 Unsteadiness on feet: Secondary | ICD-10-CM | POA: Insufficient documentation

## 2021-06-24 ENCOUNTER — Ambulatory Visit: Payer: Medicaid Other | Admitting: Neurology

## 2021-07-01 ENCOUNTER — Ambulatory Visit: Payer: Medicaid Other | Attending: Internal Medicine | Admitting: Physical Therapy

## 2021-07-01 ENCOUNTER — Encounter: Payer: Self-pay | Admitting: Physical Therapy

## 2021-07-01 DIAGNOSIS — M79671 Pain in right foot: Secondary | ICD-10-CM | POA: Diagnosis present

## 2021-07-01 DIAGNOSIS — R208 Other disturbances of skin sensation: Secondary | ICD-10-CM | POA: Diagnosis present

## 2021-07-01 DIAGNOSIS — M79672 Pain in left foot: Secondary | ICD-10-CM | POA: Insufficient documentation

## 2021-07-01 DIAGNOSIS — M6281 Muscle weakness (generalized): Secondary | ICD-10-CM | POA: Diagnosis present

## 2021-07-01 DIAGNOSIS — R2681 Unsteadiness on feet: Secondary | ICD-10-CM | POA: Diagnosis present

## 2021-07-01 NOTE — Therapy (Signed)
?OUTPATIENT PHYSICAL THERAPY TREATMENT NOTE ? ? ?Patient Name: Carmen Daniels ?MRN: [id number] ?DOB:05/27/1985, 36 y.o., female ?Today's Date: 07/01/2021 ? ?PCP: [name], MD ?REFERRING PROVIDER: [name], MD ? ? PT End of Session - 07/01/21 1336   ? ? Visit Number [phone]   ? Number of Visits 21   ? Date for PT Re-Evaluation 08/11/21   ? Authorization Type [place] [facility]   ? Authorization Time Period 1332   ? PT Start Time 1333   ? PT Stop Time 9767   ? PT Time Calculation (min) 42 min   ? Activity Tolerance Patient tolerated treatment well;Other (comment)   Pt tearful over loss of friend by suicide recently  ? Behavior During Therapy [facility] for tasks assessed/performed   ? ?  ?  ? ?  ? ? ? ? ?Past Medical History:  ?Diagnosis Date  ? Allergic rhinitis   ? Anxiety   ? Asthma   ? Bipolar disorder ([place])   ? Borderline personality disorder ([place])   ? Bupropion overdose   ? Chronic pain syndrome   ? Depression   ? Fibromyalgia   ? generalized  ? Hallucinations   ? Migraines   ? Nasal polyps   ? OCD (obsessive compulsive disorder)   ? Ovarian cyst   ? left  ? Pneumonia   ? 2021  ? PONV (postoperative nausea and vomiting)   ? Schizophrenia ([place])   ? schizoaffective  ? ?Past Surgical History:  ?Procedure Laterality Date  ? ABDOMINAL HYSTERECTOMY    ? LAPAROSCOPIC TUBAL LIGATION Bilateral 06/13/2019  ? Procedure: LAPAROSCOPIC TUBAL LIGATION;  Surgeon: Chancy Milroy, MD;  Location: [place];  Service: Gynecology;  Laterality: Bilateral;  FILSHIE CLIPS  ? NASAL SINUS SURGERY    ? TUBAL LIGATION    ? tubes in ears    ? VAGINAL HYSTERECTOMY Bilateral 01/15/2020  ? Procedure: HYSTERECTOMY VAGINAL;  Surgeon: Chancy Milroy, MD;  Location: [place];  Service: Gynecology;  Laterality: Bilateral;  ? WISDOM TOOTH EXTRACTION    ? ?Patient Active Problem List  ? Diagnosis Date Noted  ? Intentional overdose ([place]) 02/07/2021  ? Weakness 08/19/2020  ? Gait abnormality 08/19/2020  ? Neuropathic pain 08/19/2020  ? Malnutrition of moderate  degree 07/01/2020  ? Acute renal failure ([place]) 06/30/2020  ? Non-traumatic rhabdomyolysis   ? Elevated LFTs   ? Tardive dyskinesia 11/14/2019  ? Bipolar I disorder, most recent episode depressed ([place]) 09/13/2019  ? Social anxiety disorder 09/13/2019  ? Adjustment disorder with mixed anxiety and depressed mood 07/31/2019  ? Suicidal ideation   ? Post-operative state 07/19/2019  ? Seizures ([place]) 06/08/2019  ? Bipolar 1 disorder, depressed ([place]) 05/17/2019  ? Status epilepticus ([place]) 05/17/2019  ? Intentional benzodiazepine overdose ([place]) 03/29/2019  ? Intractable chronic 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 status migrainosus 02/08/2019  ? History of elective abortion 12/28/2018  ? QT prolongation 07/03/2018  ? AMS (altered mental status) 07/03/2018  ? Serum total bilirubin elevated 07/03/2018  ? Syncope 07/02/2018  ? Borderline personality disorder ([place]) 04/11/2017  ? Bipolar I disorder, current or most recent episode manic, with psychotic features ([place]) 04/10/2017  ? Bipolar disorder ([place]) 04/10/2017  ? Abnormal MRI of head 04/21/2016  ? Chronic migraine without aura 03/27/2015  ? Mild persistent asthma 12/30/2014  ? Allergic rhinitis due to pollen 12/30/2014  ? Rhinitis medicamentosa 12/30/2014  ? Nasal polyposis 12/30/2014  ? ? ?REFERRING DIAG: G60.9 (ICD-10-CM) - Hereditary and idiopathic neuropathy, unspecified ? ?THERAPY  DIAG:  ?Muscle weakness (generalized) ? ?Unsteadiness on feet ? ?Pain in right foot ? ?Pain in left foot ? ?Other disturbances of skin sensation ? ?PERTINENT HISTORY: Hx of Bipolar I disorder, anxiety, depression, borderline personality disorder, attempted suicide, rhabdomyolysis leading to LE peripheral nerve damage ? ?PRECAUTIONS: None ? ?SUBJECTIVE: Pt reports 3to 4 /10 BIL feet pain today.Pt is crying at initiation of RX due to friend passing by suicide. Pt reports she has been sleeping a lot and feeling depressed. She states she used to see a Social worker and would like to find a new one. She  states she was just approved to be able to drive again just before coming to aquatics which made her happy.   ? ?PAIN:  ?Are you having pain? Yes ?NPRS scale: 3 to 4/10 current ?Pain location: BIL calves, feet ?PAIN TYPE: aching and dull ?Pain description: intermittent  ?Aggravating factors: walking/ standing >5 minutes ?Relieving factors: rest ? ? ?OBJECTIVE:  ? *Unless otherwise noted, objective information collected previously* ? ?DIAGNOSTIC FINDINGS: None current for primary problems ?  ?PATIENT SURVEYS:  ?LEFS Administer at treatment 1 ?04/22/2021 Lower Extremity Functional Score: 30 / 80 = 37.5 % ?06/16/2021: LEFS: 42/80, 52.5% ?  ?COGNITION: ?         Overall cognitive status: History of cognitive impairments - at baseline            ?          ?SENSATION: ?         Light touch: Deficits L5 dermatome 25% diminished on Lt ?  ?MUSCLE LENGTH: ?Hamstrings: WNL ?Thomas test: WNL BIL ?Gastroc and soleus: Severely limited BIL ?  ?POSTURE:  ?Forward posture when standing in FWW ?  ?PALPATION: ?Palpable and painful trigger points to BIL peroneal musculature, mild TTP to BIL plantar surface of feet, primarily medial arches ?  ?LE AROM/PROM: ?  ?A/PROM Right ?04/17/2021 Left ?04/17/2021 Right ?06/16/2021 Left ?06/16/2021  ?Ankle dorsiflexion -12 -8 -1/3 2/5  ?Ankle plantarflexion     60 AROM 60 AROM  ?Ankle inversion     26 AROM 42 AROM  ?Ankle eversion     30 AROM 17 AROM  ? (Blank rows = not tested) ?  ?LE MMT: ?  ?MMT Right ?04/17/2021 Left ?04/17/2021 Right ?06/16/2021 Left ?06/16/2021  ?Hip flexion 4+/5 4+/5 5/5 5/5  ?Hip extension 3/5 3/5 4+/5 4+/5  ?Hip abduction 4/5 4/5 5/5 4+/5  ?Hip internal rotation 4+/5 4+/5 5/5 4+/5  ?Hip external rotation 4/5 4+/5 5/5 5/5  ?Knee flexion 4+/5 4+/5 5/5 4+/5  ?Knee extension 4+/5 4+/5 5/5 5/5  ?Ankle dorsiflexion 4/5 4/5 4+/5 4+/5  ?Ankle plantarflexion 4/5 4/5 5/5 5/5  ?Ankle inversion 3/5 3/5 4/5 4/5  ?Ankle eversion 3/5 3/5 4/5 4/5  ? (Blank rows = not tested) ?  ?  ?FUNCTIONAL  TESTS:  ?5xSTS: 18 seconds with use of hands, unable to perform without hands ?Squat: 25% ?DL heel raise: able to complete 10 with decreased elevation ? ?06/16/2021: 18 seconds with no hands ?  ?GAIT: ?Distance walked: 39f ?Assistive device utilized: None ?Level of assistance: SBA ?Comments: Pt ambulates with decreased hip extension BIL and poor eccentric quad control as indicated by knee buckling during early and mid stance. ?  ?  ?  ?TODAY'S TREATMENT: ?[facility]:  DATE: 07-01-21 ?Pt seen for aquatic therapy today.  Treatment took place in water 3.25-4.8 ft in depth at the [employer] pool. Temp of water was 92?.  Pt entered/exited the pool via stairs independently today. ? ?Walking to acclimate to pool forward , backward, side stepping, monster walks while discussing importance of getting counseling for her sadness/depression and reason she has been sleeping more ? ?Swimming with freestyle , side stroke and back and breast stroke stroke 10 lengths of pool to get heart rate increased ? ?Runners stretch x30" BIL ?Hamstring stretch x30" BIL ?Figure 4 squat stretch x30" BIL ?Ascend/descend 5 steps with alternating steps x 6, Left knee buckling 1st attempt ?Lunge walking forwards x1 lap holding pool noodle ?Side stepping lunge walking x1 lap holding pool noodle ?Toe walking in deep part of pool and heel walking at waist deep  no pain in  R toes and minimal in L ?Curtsy lunge off submerged step x 15 each ?  ?At edge of pool, pt performed LE exercise: ?Hip abd/add x20 BIL ?Hip ext/flex with knee straight x 20 ?Hip Circles bil CC/CCW x10 each BIL ?Marching hip extension with knee flexion 2x10 BIL ?Hamstring curl x20 BIL ?Squats 2x20 reps ?Heel raises for 1 '  Pt comments on toe pain L > R ?Neuromuscular ?Balance UE support yellow aquatic DB: feet together with vision;and without vision  ?Tandem Leading R/L. Cues for core control. Increased difficulty  maintaining balance with left foot forward. ? ?Pt requires buoyancy for support and to offload joints with strengthening exercises. Viscosity of the water is needed for resistance of strengthening; water current p

## 2021-07-06 ENCOUNTER — Telehealth (HOSPITAL_COMMUNITY): Payer: Self-pay | Admitting: *Deleted

## 2021-07-06 NOTE — Telephone Encounter (Signed)
Refill request sent for Hydroxyzine. Chart reviewed and refill not required at this time. Filled 4/11 with 2 refills on file. Called to notify pharmacy. ?

## 2021-07-07 NOTE — Therapy (Signed)
?OUTPATIENT PHYSICAL THERAPY TREATMENT NOTE ? ? ?Patient Name: Carmen Daniels ?MRN: [id number] ?DOB:1986/02/08, 36 y.o., female ?Today's Date: 07/08/2021 ? ?PCP: [name], MD ?REFERRING PROVIDER: [name], MD ? ? PT End of Session - 07/08/21 1229   ? ? Visit Number 7   ? Number of Visits 21   ? Date for PT Re-Evaluation 08/11/21   ? Authorization Type [place] [facility]   ? Authorization Time Period --   ? PT Start Time 1230   ? PT Stop Time 1315   ? PT Time Calculation (min) 45 min   ? Activity Tolerance Patient tolerated treatment well;Other (comment)   ? Behavior During Therapy [facility] for tasks assessed/performed   ? ?  ?  ? ?  ? ? ? ? ? ?Past Medical History:  ?Diagnosis Date  ? Allergic rhinitis   ? Anxiety   ? Asthma   ? Bipolar disorder ([place])   ? Borderline personality disorder ([place])   ? Bupropion overdose   ? Chronic pain syndrome   ? Depression   ? Fibromyalgia   ? generalized  ? Hallucinations   ? Migraines   ? Nasal polyps   ? OCD (obsessive compulsive disorder)   ? Ovarian cyst   ? left  ? Pneumonia   ? 2021  ? PONV (postoperative nausea and vomiting)   ? Schizophrenia ([place])   ? schizoaffective  ? ?Past Surgical History:  ?Procedure Laterality Date  ? ABDOMINAL HYSTERECTOMY    ? LAPAROSCOPIC TUBAL LIGATION Bilateral 06/13/2019  ? Procedure: LAPAROSCOPIC TUBAL LIGATION;  Surgeon: Chancy Milroy, MD;  Location: [facility];  Service: Gynecology;  Laterality: Bilateral;  FILSHIE CLIPS  ? NASAL SINUS SURGERY    ? TUBAL LIGATION    ? tubes in ears    ? VAGINAL HYSTERECTOMY Bilateral 01/15/2020  ? Procedure: HYSTERECTOMY VAGINAL;  Surgeon: Chancy Milroy, MD;  Location: [facility];  Service: Gynecology;  Laterality: Bilateral;  ? WISDOM TOOTH EXTRACTION    ? ?Patient Active Problem List  ? Diagnosis Date Noted  ? Intentional overdose ([place]) 02/07/2021  ? Weakness 08/19/2020  ? Gait abnormality 08/19/2020  ? Neuropathic pain 08/19/2020  ? Malnutrition of moderate degree 07/01/2020  ? Acute renal failure ([place])  06/30/2020  ? Non-traumatic rhabdomyolysis   ? Elevated LFTs   ? Tardive dyskinesia 11/14/2019  ? Bipolar I disorder, most recent episode depressed ([place]) 09/13/2019  ? Social anxiety disorder 09/13/2019  ? Adjustment disorder with mixed anxiety and depressed mood 07/31/2019  ? Suicidal ideation   ? Post-operative state 07/19/2019  ? Seizures ([place]) 06/08/2019  ? Bipolar 1 disorder, depressed ([place]) 05/17/2019  ? Status epilepticus ([place]) 05/17/2019  ? Intentional benzodiazepine overdose ([place]) 03/29/2019  ? Intractable chronic 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 status migrainosus 02/08/2019  ? History of elective abortion 12/28/2018  ? QT prolongation 07/03/2018  ? AMS (altered mental status) 07/03/2018  ? Serum total bilirubin elevated 07/03/2018  ? Syncope 07/02/2018  ? Borderline personality disorder ([place]) 04/11/2017  ? Bipolar I disorder, current or most recent episode manic, with psychotic features ([place]) 04/10/2017  ? Bipolar disorder ([place]) 04/10/2017  ? Abnormal MRI of head 04/21/2016  ? Chronic migraine without aura 03/27/2015  ? Mild persistent asthma 12/30/2014  ? Allergic rhinitis due to pollen 12/30/2014  ? Rhinitis medicamentosa 12/30/2014  ? Nasal polyposis 12/30/2014  ? ? ?REFERRING DIAG: G60.9 (ICD-10-CM) - Hereditary and idiopathic neuropathy, unspecified ? ?THERAPY DIAG:  ?Muscle weakness (generalized) ? ?Unsteadiness on feet ? ?  Pain in right foot ? ?Pain in left foot ? ?Other disturbances of skin sensation ? ?PERTINENT HISTORY: Hx of Bipolar I disorder, anxiety, depression, borderline personality disorder, attempted suicide, rhabdomyolysis leading to LE peripheral nerve damage ? ?PRECAUTIONS: None ? ?SUBJECTIVE:  Pt is accompanied by boyfriend and his daughter pushing W/C to pool area.  Pt reports 7/10 pain in bil feet. Pt reports R ankle swelling with no incident. ? ?PAIN:  ?Are you having pain? Yes ?NPRS scale: 7/10 current ?Pain location: BIL calves, feet ?PAIN TYPE: aching and dull ?Pain  description: intermittent  ?Aggravating factors: walking/ standing >5 minutes ?Relieving factors: rest ? ? ?OBJECTIVE:  ? *Unless otherwise noted, objective information collected previously* ? ?DIAGNOSTIC FINDINGS: None current for primary problems ?  ?PATIENT SURVEYS:  ?LEFS Administer at treatment 1 ?04/22/2021 Lower Extremity Functional Score: 30 / 80 = 37.5 % ?06/16/2021: LEFS: 42/80, 52.5% ?  ?COGNITION: ?         Overall cognitive status: History of cognitive impairments - at baseline            ?          ?SENSATION: ?         Light touch: Deficits L5 dermatome 25% diminished on Lt ?  ?MUSCLE LENGTH: ?Hamstrings: WNL ?Thomas test: WNL BIL ?Gastroc and soleus: Severely limited BIL ?  ?POSTURE:  ?Forward posture when standing in FWW ?  ?PALPATION: ?Palpable and painful trigger points to BIL peroneal musculature, mild TTP to BIL plantar surface of feet, primarily medial arches ?  ?LE AROM/PROM: ?  ?A/PROM Right ?04/17/2021 Left ?04/17/2021 Right ?06/16/2021 Left ?06/16/2021  ?Ankle dorsiflexion -12 -8 -1/3 2/5  ?Ankle plantarflexion     60 AROM 60 AROM  ?Ankle inversion     26 AROM 42 AROM  ?Ankle eversion     30 AROM 17 AROM  ? (Blank rows = not tested) ?  ?LE MMT: ?  ?MMT Right ?04/17/2021 Left ?04/17/2021 Right ?06/16/2021 Left ?06/16/2021  ?Hip flexion 4+/5 4+/5 5/5 5/5  ?Hip extension 3/5 3/5 4+/5 4+/5  ?Hip abduction 4/5 4/5 5/5 4+/5  ?Hip internal rotation 4+/5 4+/5 5/5 4+/5  ?Hip external rotation 4/5 4+/5 5/5 5/5  ?Knee flexion 4+/5 4+/5 5/5 4+/5  ?Knee extension 4+/5 4+/5 5/5 5/5  ?Ankle dorsiflexion 4/5 4/5 4+/5 4+/5  ?Ankle plantarflexion 4/5 4/5 5/5 5/5  ?Ankle inversion 3/5 3/5 4/5 4/5  ?Ankle eversion 3/5 3/5 4/5 4/5  ? (Blank rows = not tested) ?  ?  ?FUNCTIONAL TESTS:  ?5xSTS: 18 seconds with use of hands, unable to perform without hands ?Squat: 25% ?DL heel raise: able to complete 10 with decreased elevation ? ?06/16/2021: 18 seconds with no hands ?  ?GAIT: ?Distance walked: 49f ?Assistive device  utilized: None ?Level of assistance: SBA ?Comments: Pt ambulates with decreased hip extension BIL and poor eccentric quad control as indicated by knee buckling during early and mid stance. ?  ?  ?  ?TODAY'S TREATMENT: ? ?[facility]:                                                DATE: 07-08-21 ?Heel raise  bil 6 sec only.  SLS L < 3, R 5 sec 5 x STS  16.03sec ?Pt seen for aquatic therapy today.  Treatment took place in water 3.25-4.8 ft in  depth at the [facility] pool. Temp of water was 92?.  Pt entered/exited the pool via stairs independently today. ? ? ?Runners stretch x30" BIL x 2 with UE support on pool edge ?Hamstring stretch x30" BIL x 2 with UE support on pool edge ?Step ups on submerged step with 1 UE support. With 40% WB ( water at pelvis height) R and L 15 x each ?Ascend/descend 5 steps with alternating steps x 10, Left foot supinating and R foot more flat footed ?Toe walking in deep part of pool and heel walking at waist deep  3 lengths of pool each. Pt with increased pain today in toes.  ?Curtsy lunge off submerged step x 15 each ? ?  ?At edge of pool, pt performed LE exercise: with 2 lb weight bil ?Hip abd/add x20 BIL ?Hip ext/flex with knee straight x 20 ?Hip Circles bil CC/CCW x10 each BIL ?PLank with hip extension on submerged bench ?Hamstring curl x20 BIL ?Squats 2x20 reps ?Heel raises for 2 inch  Pt comments on toe pain L > R ?Manual -  Great toe distraction and 2-5 digits distraction on R and L, Pt with increased flexion tone especially in 2nd digit. PROM DF bil ? ?Pt requires buoyancy for support and to offload joints with strengthening exercises. Viscosity of the water is needed for resistance of strengthening; water current perturbations provides challenge to standing balance unsupported, requiring increased core activation. ? ?[facility] Adult PT Treatment:                                                DATE: 07-01-21 ?Pt seen for aquatic therapy today.  Treatment took place in  water 3.25-4.8 ft in depth at the [employer] pool. Temp of water was 92?.  Pt entered/exited the pool via stairs independently today. ? ?Walking to acclimate to pool forward , backward, side stepping, monster walks wh

## 2021-07-08 ENCOUNTER — Ambulatory Visit: Payer: Medicaid Other | Admitting: Physical Therapy

## 2021-07-08 ENCOUNTER — Encounter: Payer: Self-pay | Admitting: Physical Therapy

## 2021-07-08 DIAGNOSIS — M79672 Pain in left foot: Secondary | ICD-10-CM

## 2021-07-08 DIAGNOSIS — R2681 Unsteadiness on feet: Secondary | ICD-10-CM

## 2021-07-08 DIAGNOSIS — M79671 Pain in right foot: Secondary | ICD-10-CM

## 2021-07-08 DIAGNOSIS — R208 Other disturbances of skin sensation: Secondary | ICD-10-CM

## 2021-07-08 DIAGNOSIS — M6281 Muscle weakness (generalized): Secondary | ICD-10-CM | POA: Diagnosis not present

## 2021-07-15 ENCOUNTER — Telehealth: Payer: Self-pay | Admitting: Neurology

## 2021-07-15 ENCOUNTER — Ambulatory Visit: Payer: Medicaid Other | Admitting: Physical Therapy

## 2021-07-15 NOTE — Telephone Encounter (Signed)
Pt's mother lm to get Botox appt resched that was missed on 05/03. Called pt's mother back lm to return my call.

## 2021-07-21 NOTE — Therapy (Incomplete)
OUTPATIENT PHYSICAL THERAPY TREATMENT NOTE   Patient Name: Carmen Daniels MRN: [id number] DOB:26-Mar-1985, 36 y.o., female Today's Date: 07/21/2021  PCP: [name], MD REFERRING PROVIDER: [name], MD        Past Medical History:  Diagnosis Date   Allergic rhinitis    Anxiety    Asthma    Bipolar disorder ([place])    Borderline personality disorder ([place])    Bupropion overdose    Chronic pain syndrome    Depression    Fibromyalgia    generalized   Hallucinations    Migraines    Nasal polyps    OCD (obsessive compulsive disorder)    Ovarian cyst    left   Pneumonia    2021   PONV (postoperative nausea and vomiting)    Schizophrenia ([place])    schizoaffective   Past Surgical History:  Procedure Laterality Date   ABDOMINAL HYSTERECTOMY     LAPAROSCOPIC TUBAL LIGATION Bilateral 06/13/2019   Procedure: LAPAROSCOPIC TUBAL LIGATION;  Surgeon: Chancy Milroy, MD;  Location: [facility];  Service: Gynecology;  Laterality: Bilateral;  FILSHIE CLIPS   NASAL SINUS SURGERY     TUBAL LIGATION     tubes in ears     VAGINAL HYSTERECTOMY Bilateral 01/15/2020   Procedure: HYSTERECTOMY VAGINAL;  Surgeon: Chancy Milroy, MD;  Location: [facility];  Service: Gynecology;  Laterality: Bilateral;   WISDOM TOOTH EXTRACTION     Patient Active Problem List   Diagnosis Date Noted   Intentional overdose ([place]) 02/07/2021   Weakness 08/19/2020   Gait abnormality 08/19/2020   Neuropathic pain 08/19/2020   Malnutrition of moderate degree 07/01/2020   Acute renal failure ([place]) 06/30/2020   Non-traumatic rhabdomyolysis    Elevated LFTs    Tardive dyskinesia 11/14/2019   Bipolar I disorder, most recent episode depressed ([place]) 09/13/2019   Social anxiety disorder 09/13/2019   Adjustment disorder with mixed anxiety and depressed mood 07/31/2019   Suicidal ideation    Post-operative state 07/19/2019   Seizures ([place]) 06/08/2019   Bipolar 1 disorder, depressed ([place]) 05/17/2019    Status epilepticus ([place]) 05/17/2019   Intentional benzodiazepine overdose ([place]) 03/29/2019   Intractable chronic 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 status migrainosus 02/08/2019   History of elective abortion 12/28/2018   QT prolongation 07/03/2018   AMS (altered mental status) 07/03/2018   Serum total bilirubin elevated 07/03/2018   Syncope 07/02/2018   Borderline personality disorder ([place]) 04/11/2017   Bipolar I disorder, current or most recent episode manic, with psychotic features ([place]) 04/10/2017   Bipolar disorder ([place]) 04/10/2017   Abnormal MRI of head 04/21/2016   Chronic migraine without aura 03/27/2015   Mild persistent asthma 12/30/2014   Allergic rhinitis due to pollen 12/30/2014   Rhinitis medicamentosa 12/30/2014   Nasal polyposis 12/30/2014    REFERRING DIAG: G60.9 (ICD-10-CM) - Hereditary and idiopathic neuropathy, unspecified  THERAPY DIAG:  No diagnosis found.  PERTINENT HISTORY: Hx of Bipolar I disorder, anxiety, depression, borderline personality disorder, attempted suicide, rhabdomyolysis leading to LE peripheral nerve damage  PRECAUTIONS: None  SUBJECTIVE:  Pt is accompanied by boyfriend and his daughter pushing W/C to pool area.  Pt reports 7/10 pain in bil feet. Pt reports R ankle swelling with no incident.  PAIN:  Are you having pain? Yes NPRS scale: 7/10 current Pain location: BIL calves, feet PAIN TYPE: aching and dull Pain description: intermittent  Aggravating factors: walking/ standing >5 minutes Relieving factors: rest   OBJECTIVE:   *Unless otherwise noted,  objective information collected previously*  DIAGNOSTIC FINDINGS: None current for primary problems   PATIENT SURVEYS:  LEFS Administer at treatment 1 04/22/2021 Lower Extremity Functional Score: 30 / 80 = 37.5 % 06/16/2021: LEFS: 42/80, 52.5%   COGNITION:          Overall cognitive status: History of cognitive impairments - at baseline                      SENSATION:          Light  touch: Deficits L5 dermatome 25% diminished on Lt   MUSCLE LENGTH: Hamstrings: WNL Thomas test: WNL BIL Gastroc and soleus: Severely limited BIL   POSTURE:  Forward posture when standing in FWW   PALPATION: Palpable and painful trigger points to BIL peroneal musculature, mild TTP to BIL plantar surface of feet, primarily medial arches   LE AROM/PROM:   A/PROM Right 04/17/2021 Left 04/17/2021 Right 06/16/2021 Left 06/16/2021  Ankle dorsiflexion -12 -8 -1/3 2/5  Ankle plantarflexion     60 AROM 60 AROM  Ankle inversion     26 AROM 42 AROM  Ankle eversion     30 AROM 17 AROM   (Blank rows = not tested)   LE MMT:   MMT Right 04/17/2021 Left 04/17/2021 Right 06/16/2021 Left 06/16/2021  Hip flexion 4+/5 4+/5 5/5 5/5  Hip extension 3/5 3/5 4+/5 4+/5  Hip abduction 4/5 4/5 5/5 4+/5  Hip internal rotation 4+/5 4+/5 5/5 4+/5  Hip external rotation 4/5 4+/5 5/5 5/5  Knee flexion 4+/5 4+/5 5/5 4+/5  Knee extension 4+/5 4+/5 5/5 5/5  Ankle dorsiflexion 4/5 4/5 4+/5 4+/5  Ankle plantarflexion 4/5 4/5 5/5 5/5  Ankle inversion 3/5 3/5 4/5 4/5  Ankle eversion 3/5 3/5 4/5 4/5   (Blank rows = not tested)     FUNCTIONAL TESTS:  5xSTS: 18 seconds with use of hands, unable to perform without hands Squat: 25% DL heel raise: able to complete 10 with decreased elevation  06/16/2021: 18 seconds with no hands   GAIT: Distance walked: 57f Assistive device utilized: None Level of assistance: SBA Comments: Pt ambulates with decreased hip extension BIL and poor eccentric quad control as indicated by knee buckling during early and mid stance.       [facility]                                                [facility]: 07[facility]21 Therapeutic Exercise: *** Manual Therapy: *** Neuromuscular re-ed: *** Therapeutic Activity: *** Modalities: *** Self Care: ***   [facility]:                                                DATE: 07-08-21 Heel raise  bil  6 sec only.  SLS L < 3, R 5 sec 5 x STS  16.03sec Pt seen for aquatic therapy today.  Treatment took place in water 3.25-4.8 ft [facility] Temp of water was 92.  Pt entered/exited the pool via stairs independently today.   Runners stretch x30" BIL x 2 with UE support on pool edge Hamstring stretch x30" BIL x 2 with UE support on  pool edge Step ups on submerged step with 1 UE support. With 40% WB ( water at pelvis height) R and L 15 x each Ascend/descend 5 steps with alternating steps x 10, Left foot supinating and R foot more flat footed Toe walking in deep part of pool and heel walking at waist deep  3 lengths of pool each. Pt with increased pain today in toes.  Curtsy lunge off submerged step x 15 each    At edge of pool, pt performed LE exercise: with 2 lb weight bil Hip abd/add x20 BIL Hip ext/flex with knee straight x 20 Hip Circles bil CC/CCW x10 each BIL PLank with hip extension on submerged bench Hamstring curl x20 BIL Squats 2x20 reps Heel raises for 2 inch  Pt comments on toe pain L > R Manual -  Great toe distraction and 2-5 digits distraction on R and L, Pt with increased flexion tone especially in 2nd digit. PROM DF bil  Pt requires buoyancy for support and to offload joints with strengthening exercises. Viscosity of the water is needed for resistance of strengthening; water current perturbations provides challenge to standing balance unsupported, requiring increased core activation.  [facility] Adult PT Treatment:                                                DATE: 07-01-21 Pt seen for aquatic therapy today.  Treatment took place in water 3.25-4.8 ft in depth at the [facility] pool. Temp of water was 92.  Pt entered/exited the pool via stairs independently today.  Walking to acclimate to pool forward , backward, side stepping, monster walks while discussing importance of getting counseling for her sadness/depression and reason she has  been sleeping more  Swimming with freestyle , side stroke and back and breast stroke stroke 10 lengths of pool to get heart rate increased  Runners stretch x30" BIL Hamstring stretch x30" BIL Figure 4 squat stretch x30" BIL Ascend/descend 5 steps with alternating steps x 6, Left knee buckling 1st attempt Lunge walking forwards x1 lap holding pool noodle Side stepping lunge walking x1 lap holding pool noodle Toe walking in deep part of pool and heel walking at waist deep  no pain in  R toes and minimal in L Curtsy lunge off submerged step x 15 each   At edge of pool, pt performed LE exercise: Hip abd/add x20 BIL Hip ext/flex with knee straight x 20 Hip Circles bil CC/CCW x10 each BIL Marching hip extension with knee flexion 2x10 BIL Hamstring curl x20 BIL Squats 2x20 reps Heel raises for 1 '  Pt comments on toe pain L > R Neuromuscular Balance UE support yellow aquatic DB: feet together with vision;and without vision  Tandem Leading R/L. Cues for core control. Increased difficulty maintaining balance with left foot forward.  Pt requires buoyancy for support and to offload joints with strengthening exercises. Viscosity of the water is needed for resistance of strengthening; water current perturbations provides challenge to standing balance unsupported, requiring increased core activation.  [facility] Adult PT Treatment:                                                DATE: 06/16/2021 Therapeutic Exercise: Standing gastroc stretch  at wall x1 min BIL Seated ankle eversion/ inversion with towel and 3# dumbbells x3 each BIL Dead lift with 10# kettlebell 2x8 Manual Therapy: N/A Neuromuscular re-ed: N/A Therapeutic Activity: Re-assessment of objective measures with pt education on progress made in PT Re-administration of LEFS Modalities: N/A Self Care: N/A   [facility] Adult PT Treatment:                                                DATE: 05-20-21 Therapeutic Exercise: Pt seen for aquatic  therapy today.  Treatment took place in water 3.25-4.8 ft in depth at the [facility] pool. Temp of water was 91.  Pt entered/exited the pool via stairs (step through pattern SBAx 1 by PT  Walking to acclimate to pool forward , backward, side stepping, monster walks At edge of pool using 2 lb weight on R and L LE's Heel raises Squats Hip marching utilzing pool noodle for additonal resistance Heel cord stretching with great toe extended on pool wall 30 sec x 4  Balance UE support yellow aquatic DB: feet together with vision;and without vision    -Tandem Leading R/L. Cues for core control. Increased difficulty maintaining balance with left foot forward.  Supine abdominal hollowing with VC to keep hips up and feet up with  yellow barbells in UE submerged prone aquatic  plank with barbell submerged for abdominal engagement Combo prone to supine with legs extended for 3 minutes to encourage active abdominal engagement.  Swimming with freestyle , side stroke and back stroke 8 lengths of pool  PT with distraction and mobilization of L great toe and 2,3,4,5 digits with pain at end range of Great toe mobs. Pt with increased tone of phalanges of Left foot    Pt requires buoyancy for support and to offload joints with strengthening exercises. Viscosity of the water is needed for resistance of strengthening; water current perturbations provides challenge to standing balance unsupported, requiring increased core activation.    [facility] Adult PT Treatment:                                                DATE: 04/27/2021 Therapeutic Exercise: Pt seen for aquatic therapy today.  Treatment took place in water 3.25-4.8 ft in depth at the [facility] pool. Temp of water was 91.  Pt entered/exited the pool via stairs (step through pattern independently with bilat rail. Re into into setting  Walking Toe raises 4.8 ft on step Heel cord stretching R/L 4 x 30s Step ups leading R/L x10 ea Mini  squats 2x10. Cues for weightbearing through heels for quad and glut activation. Balance ue support yellow hand buoys: feet together with vision; unsupported;VE supported; shoulder add/abd  -Tandem Leading R/L. Cues for core control. Increased difficulty maintaining balance with left foot forward. Side lunges width of pool x 6 with ue support yellow hand buoys add/abd.  Pt requires buoyancy for support and to offload joints with strengthening exercises. Viscosity of the water is needed for resistance of strengthening; water current perturbations provides challenge to standing balance unsupported, requiring increased core activation.        PATIENT EDUCATION:  Education details: Objective findings, prognosis, POC, and HEP Person educated: Patient and Armed forces training and education officer  method: Explanation, Demonstration, and Handouts Education comprehension: verbalized understanding and returned demonstration     HOME EXERCISE PROGRAM: Access Code: [email or web address] URL: [email or web address] Date: 04/17/2021 Prepared by: Vanessa Garfield   Exercises Supine Bridge - 1 x daily - 7 x weekly - 3 sets - 10 reps - 3-sec hold Sidelying Hip Abduction - 1 x daily - 7 x weekly - 3 sets - 10 reps - 3-sec hold Seated knee extension with slow lowering - 1 x daily - 7 x weekly - 3 sets - 10 reps - 3-sec hold Heel Raises with Counter Support - 1 x daily - 7 x weekly - 3 sets - 10 reps - 3-sec hold  Added 06/16/2021: - Gastroc Stretch on Wall  - 1 x daily - 7 x weekly - 2 sets - 1-min hold - Mini Squat with Counter Support  - 1 x daily - 7 x weekly - 3 sets - 10 reps     ASSESSMENT: CLINICAL IMPRESSION:    Ms Heather presents to aquatic therapy accompanied with boyfriend and his 21 yo dtr.  Pt tested on land before aquatics from pool bench. 18 in from floor Heel raise  bil 6 sec only.  SLS L < 3, R 5 sec 5 x STS  16.03sec She enters pool with W/C but is able to independently enter/exit pool with handrails.   Pt complains of 7/10 bil foot pain and R ankle slightly more swollen than L with no incidence. Pt is able to continue exercise for 45 min and tolerating more challenge with steps and underwater steps and heel raises.  Limiting factor is pain on plantar surface of  foot.  Pt with flat foot on R and high arch on Left with supination . Pt with increased flexion of toes especially 2nd digit.  Pt is now driving at home.  Pt will continue to benefit from aquatics for next 2 visits and need to be reevaluated on land.             OBJECTIVE IMPAIRMENTS Abnormal gait, decreased balance, decreased coordination, and decreased knowledge of use of DME.    ACTIVITY LIMITATIONS cleaning, community activity, driving, laundry, and shopping.    PERSONAL FACTORS Behavior pattern, Past/current experiences, and 3+ comorbidities: see medical hx  are also affecting patient's functional outcome.      REHAB POTENTIAL: Good   CLINICAL DECISION MAKING: Unstable/unpredictable   EVALUATION COMPLEXITY: High     GOALS: Goals reviewed with patient? Yes   SHORT TERM GOALS:   STG Name Target Date Goal status  1 Pt will report understanding and adherence to her HEP in order to promote independence in the management of her primary impairments. Baseline: HEP provided at eval. 06/16/2021: Pt reports daily adherence to her HEP 05/15/2021 ACHIEVED  2 Pt will achieve 10 full-height heel raises in order to reach into overhead cabinets without limitation. Baseline: x10 with 50% decreased heel height 05/15/2021 INITIAL  3 Pt will complete a 6MWT with supervision and no AD in order to progress to grocery shopping with less limitation. Baseline: Pt unable to walk more than 5 minutes (subjective report) due to fatigue. 05/15/2021 INITIAL    LONG TERM GOALS:    LTG Name Target Date Goal status  1 Pt will report ability to walk 20 minutes without AD in order to shop with her friends with less limitation. Baseline: Unable to  walk >5 minutes due to fatigue 06/16/2021: Pt reports being able to walk 5-10 minutes with [facility] 06/12/2021  PROGRESSING  2 Pt will achieve an LEFS score of 60% in order demonstrate improved functional ability as it relates to her primary impairments. Baseline: Administer at 1st treatment 04/22/2021 37.5% 06/16/2021: 52.5% 06/12/2021 PROGRESSING  3 Pt will accomplish 5xSTS without UE support in under 13 seconds in order to demonstrate safe transfer ability. Baseline: 18 seconds with UE support 06/16/2021: 18 seconds with NO UE support 06/12/2021 PROGRESSING  4 Pt will achieve global BIL ankle strength of 4+/5 or greater in order to return to driving with less limitation. Baseline: See MMT chart 06/16/2021: See updated MMT chart 06/12/2021 PROGRESSING    PLAN: PT FREQUENCY: 2x/week   PT DURATION: 8 weeks   PLANNED INTERVENTIONS: Therapeutic exercises, Therapeutic activity, Neuro Muscular re-education, Balance training, Gait training, Patient/Family education, Joint mobilization, Stair training, DME instructions, Aquatic Therapy, Dry Needling, Electrical stimulation, Cryotherapy, Moist heat, Manual lymph drainage, Compression bandaging, Taping, Vasopneumatic device, Biofeedback, and Manual therapy   PLAN FOR NEXT SESSION: gait and transfer training, LE strengthening preferably in closed-chain  ***

## 2021-07-22 ENCOUNTER — Ambulatory Visit: Payer: Medicaid Other | Admitting: Physical Therapy

## 2021-07-28 ENCOUNTER — Ambulatory Visit: Payer: Medicaid Other | Admitting: Neurology

## 2021-07-28 ENCOUNTER — Encounter: Payer: Self-pay | Admitting: Neurology

## 2021-07-28 VITALS — Ht 71.0 in

## 2021-07-28 DIAGNOSIS — G43719 Chronic migraine without aura, intractable, without status migrainosus: Secondary | ICD-10-CM | POA: Diagnosis not present

## 2021-07-28 NOTE — Progress Notes (Signed)
Botox injection for chronic migraine prevention, injection was performed according to [place] protocol,  5 units of Botox was injected into each side, for 31 injection sites, total of 155 units  Bilateral frontalis 4 injection sites Bilateral corrugate 2 injection sites Procerus 1 injection sites. Bilateral temporalis 8 injection sites Bilateral occipitalis 6 injection sites Bilateral cervical paraspinals 4 injection sites Bilateral upper trapezius 6 injection sites  Extra 45 unites were injected into bilateral temporoparietal region and upper cervical region

## 2021-07-28 NOTE — Progress Notes (Signed)
Botox 200 units NDC: 1102-1117-35 Lot: [id number] Exp: 2025/05  SP

## 2021-08-11 ENCOUNTER — Ambulatory Visit: Payer: Medicaid Other | Attending: Internal Medicine

## 2021-08-11 DIAGNOSIS — M79672 Pain in left foot: Secondary | ICD-10-CM | POA: Diagnosis present

## 2021-08-11 DIAGNOSIS — R2681 Unsteadiness on feet: Secondary | ICD-10-CM | POA: Diagnosis present

## 2021-08-11 DIAGNOSIS — M79671 Pain in right foot: Secondary | ICD-10-CM | POA: Diagnosis present

## 2021-08-11 DIAGNOSIS — R208 Other disturbances of skin sensation: Secondary | ICD-10-CM | POA: Insufficient documentation

## 2021-08-11 DIAGNOSIS — M6281 Muscle weakness (generalized): Secondary | ICD-10-CM | POA: Insufficient documentation

## 2021-08-11 NOTE — Therapy (Addendum)
OUTPATIENT PHYSICAL THERAPY TREATMENT NOTE/ RE-CERTIFICATION/ DISCHARGE SUMMARY   Patient Name: Carmen Daniels MRN: [id number] DOB:Mar 23, 1985, 36 y.o., female Today's Date: 08/11/2021  PCP: [name], MD REFERRING PROVIDER: [name], MD   PT End of Session - 08/11/21 1042     Visit Number 8    Number of Visits 14    Date for PT Re-Evaluation 09/29/21    Authorization Type [employer]    Authorization Time Period pending additional auth    PT Start Time 1000    PT Stop Time 1045    PT Time Calculation (min) 45 min    Activity Tolerance Patient tolerated treatment well    Behavior During Therapy [facility] for tasks assessed/performed                 Past Medical History:  Diagnosis Date   Allergic rhinitis    Anxiety    Asthma    Bipolar disorder ([place])    Borderline personality disorder ([place])    Bupropion overdose    Chronic pain syndrome    Depression    Fibromyalgia    generalized   Hallucinations    Migraines    Nasal polyps    OCD (obsessive compulsive disorder)    Ovarian cyst    left   Pneumonia    2021   PONV (postoperative nausea and vomiting)    Schizophrenia ([place])    schizoaffective   Past Surgical History:  Procedure Laterality Date   ABDOMINAL HYSTERECTOMY     LAPAROSCOPIC TUBAL LIGATION Bilateral 06/13/2019   Procedure: LAPAROSCOPIC TUBAL LIGATION;  Surgeon: Chancy Milroy, MD;  Location: [place];  Service: Gynecology;  Laterality: Bilateral;  FILSHIE CLIPS   NASAL SINUS SURGERY     TUBAL LIGATION     tubes in ears     VAGINAL HYSTERECTOMY Bilateral 01/15/2020   Procedure: HYSTERECTOMY VAGINAL;  Surgeon: Chancy Milroy, MD;  Location: [place];  Service: Gynecology;  Laterality: Bilateral;   WISDOM TOOTH EXTRACTION     Patient Active Problem List   Diagnosis Date Noted   Intentional overdose ([place]) 02/07/2021   Weakness 08/19/2020   Gait abnormality 08/19/2020   Neuropathic pain 08/19/2020   Malnutrition of moderate degree  07/01/2020   Acute renal failure ([place]) 06/30/2020   Non-traumatic rhabdomyolysis    Elevated LFTs    Tardive dyskinesia 11/14/2019   Bipolar I disorder, most recent episode depressed ([place]) 09/13/2019   Social anxiety disorder 09/13/2019   Adjustment disorder with mixed anxiety and depressed mood 07/31/2019   Suicidal ideation    Post-operative state 07/19/2019   Seizures ([place]) 06/08/2019   Bipolar 1 disorder, depressed ([place]) 05/17/2019   Status epilepticus ([place]) 05/17/2019   Intentional benzodiazepine overdose ([place]) 03/29/2019   Intractable chronic 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 status migrainosus 02/08/2019   History of elective abortion 12/28/2018   QT prolongation 07/03/2018   AMS (altered mental status) 07/03/2018   Serum total bilirubin elevated 07/03/2018   Syncope 07/02/2018   Borderline personality disorder ([place]) 04/11/2017   Bipolar I disorder, current or most recent episode manic, with psychotic features ([place]) 04/10/2017   Bipolar disorder ([place]) 04/10/2017   Abnormal MRI of head 04/21/2016   Chronic migraine without aura 03/27/2015   Mild persistent asthma 12/30/2014   Allergic rhinitis due to pollen 12/30/2014   Rhinitis medicamentosa 12/30/2014   Nasal polyposis 12/30/2014    REFERRING DIAG: G60.9 (ICD-10-CM) - Hereditary and idiopathic neuropathy, unspecified  THERAPY DIAG:  Muscle weakness (  generalized) - Plan: PT plan of care cert/re-cert  Unsteadiness on feet - Plan: PT plan of care cert/re-cert  Pain in right foot - Plan: PT plan of care cert/re-cert  Pain in left foot - Plan: PT plan of care cert/re-cert  Other disturbances of skin sensation - Plan: PT plan of care cert/re-cert  PERTINENT HISTORY: Hx of Bipolar I disorder, anxiety, depression, borderline personality disorder, attempted suicide, rhabdomyolysis leading to LE peripheral nerve damage  PRECAUTIONS: None  SUBJECTIVE:  Pt returns to PT after a hiatus from treatment. She reports she has  continued to struggle with depression since the loss of her friend a few months ago. Due to this, she has not been adherent to her HEP.  She reports continuing telehealth mental health follow-ups, and she reports she plans on starting grief counseling. She reports that she would like to continue PT to focus on strengthening and pain reduction.   PAIN:  Are you having pain? Yes NPRS scale: 7/10 current Pain location: BIL calves, feet PAIN TYPE: aching and dull Pain description: intermittent  Aggravating factors: walking/ standing >5 minutes Relieving factors: rest   OBJECTIVE:   *Unless otherwise noted, objective information collected previously*  DIAGNOSTIC FINDINGS: None current for primary problems   PATIENT SURVEYS:  LEFS Administer at treatment 1 04/22/2021 Lower Extremity Functional Score: 30 / 80 = 37.5 % 06/16/2021: LEFS: 42/80, 52.5% 08/11/2021: LEFS: 42/80, 52.5%   COGNITION:          Overall cognitive status: History of cognitive impairments - at baseline                      SENSATION:          Light touch: Deficits L5 dermatome 25% diminished on Lt   MUSCLE LENGTH: Hamstrings: WNL Thomas test: WNL BIL Gastroc and soleus: Severely limited BIL   POSTURE:  Forward posture when standing in FWW   PALPATION: Palpable and painful trigger points to BIL peroneal musculature, mild TTP to BIL plantar surface of feet, primarily medial arches   LE AROM/PROM:   A/PROM Right 04/17/2021 Left 04/17/2021 Right 06/16/2021 Left 06/16/2021 Right 08/11/2021 Left 08/11/2021  Ankle dorsiflexion -12 -8 -1/3 2/5 5/10 2/8  Ankle plantarflexion     60 AROM 60 AROM    Ankle inversion     26 AROM 42 AROM 40/50 35/50  Ankle eversion     30 AROM 17 AROM 25/40 20/45   (Blank rows = not tested)   LE MMT:   MMT Right 04/17/2021 Left 04/17/2021 Right 06/16/2021 Left 06/16/2021  Hip flexion 4+/5 4+/5 5/5 5/5  Hip extension 3/5 3/5 4+/5 4+/5  Hip abduction 4/5 4/5 5/5 4+/5  Hip internal  rotation 4+/5 4+/5 5/5 4+/5  Hip external rotation 4/5 4+/5 5/5 5/5  Knee flexion 4+/5 4+/5 5/5 4+/5  Knee extension 4+/5 4+/5 5/5 5/5  Ankle dorsiflexion 4/5 4/5 4+/5 4+/5  Ankle plantarflexion 4/5 4/5 5/5 5/5  Ankle inversion 3/5 3/5 4/5 4/5  Ankle eversion 3/5 3/5 4/5 4/5   (Blank rows = not tested)     FUNCTIONAL TESTS:  5xSTS: 18 seconds with use of hands, unable to perform without hands Squat: 25% DL heel raise: able to complete 10 with decreased elevation  06/16/2021: 18 seconds with no hands  08/11/2021: 5xSTS: 14 seconds  DL heel raise: Able to complete 15 with increased pain and BIL knee flexion   GAIT: Distance walked: 9f Assistive device utilized: None Level of assistance:  SBA Comments: Pt ambulates with decreased hip extension BIL and poor eccentric quad control as indicated by knee buckling during early and mid stance.       TODAY'S TREATMENT:  [facility] Adult PT Treatment:                                                DATE: 08/11/2021 Therapeutic Exercise: DL heel raises 3x10 Squat to table with alternating mini-squat side step down and back length of mat table x3 Manual Therapy: N/A Neuromuscular re-ed: N/A Therapeutic Activity: Re-assessment of objective measures with pt education Re-administration of LEFS with pt education Pt education on pain centralization, neuroscience with further education on prognosis/ POC Modalities: N/A Self Care: N/A   [facility] Adult PT Treatment:                                                DATE: 07-08-21 Heel raise  bil 6 sec only.  SLS L < 3, R 5 sec 5 x STS  16.03sec Pt seen for aquatic therapy today.  Treatment took place in water 3.25-4.8 ft in depth at the [facility] pool. Temp of water was 92.  Pt entered/exited the pool via stairs independently today.   Runners stretch x30" BIL x 2 with UE support on pool edge Hamstring stretch x30" BIL x 2 with UE support on pool edge Step ups on submerged step with 1 UE  support. With 40% WB ( water at pelvis height) R and L 15 x each Ascend/descend 5 steps with alternating steps x 10, Left foot supinating and R foot more flat footed Toe walking in deep part of pool and heel walking at waist deep  3 lengths of pool each. Pt with increased pain today in toes.  Curtsy lunge off submerged step x 15 each    At edge of pool, pt performed LE exercise: with 2 lb weight bil Hip abd/add x20 BIL Hip ext/flex with knee straight x 20 Hip Circles bil CC/CCW x10 each BIL PLank with hip extension on submerged bench Hamstring curl x20 BIL Squats 2x20 reps Heel raises for 2 inch  Pt comments on toe pain L > R Manual -  Great toe distraction and 2-5 digits distraction on R and L, Pt with increased flexion tone especially in 2nd digit. PROM DF bil  Pt requires buoyancy for support and to offload joints with strengthening exercises. Viscosity of the water is needed for resistance of strengthening; water current perturbations provides challenge to standing balance unsupported, requiring increased core activation.  [facility] Adult PT Treatment:                                                DATE: 07-01-21 Pt seen for aquatic therapy today.  Treatment took place in water 3.25-4.8 ft in depth at the [employer] pool. Temp of water was 92.  Pt entered/exited the pool via stairs independently today.  Walking to acclimate to pool forward , backward, side stepping, monster walks while discussing importance of getting counseling for her sadness/depression and reason she has been sleeping more  Swimming with freestyle , side stroke and back and breast stroke stroke 10 lengths of pool to get heart rate increased  Runners stretch x30" BIL Hamstring stretch x30" BIL Figure 4 squat stretch x30" BIL Ascend/descend 5 steps with alternating steps x 6, Left knee buckling 1st attempt Lunge walking forwards x1 lap holding pool noodle Side stepping lunge walking x1 lap holding pool  noodle Toe walking in deep part of pool and heel walking at waist deep  no pain in  R toes and minimal in L Curtsy lunge off submerged step x 15 each   At edge of pool, pt performed LE exercise: Hip abd/add x20 BIL Hip ext/flex with knee straight x 20 Hip Circles bil CC/CCW x10 each BIL Marching hip extension with knee flexion 2x10 BIL Hamstring curl x20 BIL Squats 2x20 reps Heel raises for 1 '  Pt comments on toe pain L > R Neuromuscular Balance UE support yellow aquatic DB: feet together with vision;and without vision  Tandem Leading R/L. Cues for core control. Increased difficulty maintaining balance with left foot forward.  Pt requires buoyancy for support and to offload joints with strengthening exercises. Viscosity of the water is needed for resistance of strengthening; water current perturbations provides challenge to standing balance unsupported, requiring increased core activation.       PATIENT EDUCATION:  Education details: Objective findings, prognosis, POC, and HEP Person educated: Patient and Caregiver Education method: Explanation, Demonstration, and Handouts Education comprehension: verbalized understanding and returned demonstration     HOME EXERCISE PROGRAM: Access Code: [email or web address] URL: [email or web address] Date: 04/17/2021 Prepared by: Vanessa Frederica   Exercises Supine Bridge - 1 x daily - 7 x weekly - 3 sets - 10 reps - 3-sec hold Sidelying Hip Abduction - 1 x daily - 7 x weekly - 3 sets - 10 reps - 3-sec hold Seated knee extension with slow lowering - 1 x daily - 7 x weekly - 3 sets - 10 reps - 3-sec hold Heel Raises with Counter Support - 1 x daily - 7 x weekly - 3 sets - 10 reps - 3-sec hold  Added 06/16/2021: - Gastroc Stretch on Wall  - 1 x daily - 7 x weekly - 2 sets - 1-min hold - Mini Squat with Counter Support  - 1 x daily - 7 x weekly - 3 sets - 10 reps     ASSESSMENT: CLINICAL IMPRESSION:  Pt arrives with continued high  levels of pain. Upon re-assessment, she has made continued progress in her ankle ROM and 5xSTS, although she remains limited in her functional ankle strength. Due to pt reports of continued depression related to the passing of her friend, she and her mother were provided several resources to mental health/ grief counseling services. She remains in contact with her psychiatrist, as well. The pt will continue to benefit from skilled PT to address her primary impairments and return to her prior level of function with less limitation.      OBJECTIVE IMPAIRMENTS Abnormal gait, decreased balance, decreased coordination, and decreased knowledge of use of DME.    ACTIVITY LIMITATIONS cleaning, community activity, driving, laundry, and shopping.    PERSONAL FACTORS Behavior pattern, Past/current experiences, and 3+ comorbidities: see medical hx  are also affecting patient's functional outcome.       GOALS: Goals reviewed with patient? Yes   SHORT TERM GOALS:   STG Name Target Date Goal status  1 Pt will report understanding and adherence to her HEP in order to promote  independence in the management of her primary impairments. Baseline: HEP provided at eval. 06/16/2021: Pt reports daily adherence to her HEP 05/15/2021 ACHIEVED  2 Pt will achieve 10 full-height heel raises in order to reach into overhead cabinets without limitation. Baseline: x10 with 50% decreased heel height 05/15/2021 INITIAL  3 Pt will complete a 6MWT with supervision and no AD in order to progress to grocery shopping with less limitation. Baseline: Pt unable to walk more than 5 minutes (subjective report) due to fatigue. 05/15/2021 INITIAL    LONG TERM GOALS:    LTG Name Target Date Goal status  1 Pt will report ability to walk 20 minutes without AD in order to shop with her friends with less limitation. Baseline: Unable to walk >5 minutes due to fatigue 06/16/2021: Pt reports being able to walk 5-10 minutes with [facility] 06/12/2021  PROGRESSING  2 Pt will achieve an LEFS score of 60% in order demonstrate improved functional ability as it relates to her primary impairments. Baseline: Administer at 1st treatment 04/22/2021 37.5% 06/16/2021: 52.5% 06/12/2021 PROGRESSING  3 Pt will accomplish 5xSTS without UE support in under 13 seconds in order to demonstrate safe transfer ability. Baseline: 18 seconds with UE support 06/16/2021: 18 seconds with NO UE support 06/12/2021 PROGRESSING  4 Pt will achieve global BIL ankle strength of 4+/5 or greater in order to return to driving with less limitation. Baseline: See MMT chart 06/16/2021: See updated MMT chart 06/12/2021 PROGRESSING    PLAN: PT FREQUENCY: 2x/week   PT DURATION: 8 weeks   PLANNED INTERVENTIONS: Therapeutic exercises, Therapeutic activity, Neuro Muscular re-education, Balance training, Gait training, Patient/Family education, Joint mobilization, Stair training, DME instructions, Aquatic Therapy, Dry Needling, Electrical stimulation, Cryotherapy, Moist heat, Manual lymph drainage, Compression bandaging, Taping, Vasopneumatic device, Biofeedback, and Manual therapy   PLAN FOR NEXT SESSION: gait and transfer training, LE strengthening preferably in closed-chain   Vanessa Ferguson, PT, DPT 08/11/21 10:45 AM  PHYSICAL THERAPY DISCHARGE SUMMARY  Visits from Start of Care: 8  Current functional level related to goals / functional outcomes: Pt made progress in walking ability, LE strength, and LEFS score.   Remaining deficits: Pt remains limited in above metrics.   Education / Equipment: HEP   Patient agrees to discharge. Patient goals were partially met. Patient is being discharged due to not returning since the last visit.  Vanessa [place], PT, DPT 10/15/21 9:56 AM

## 2021-08-22 ENCOUNTER — Ambulatory Visit: Payer: Medicaid Other

## 2021-08-26 ENCOUNTER — Encounter (HOSPITAL_COMMUNITY): Payer: Self-pay

## 2021-08-26 ENCOUNTER — Encounter (HOSPITAL_COMMUNITY): Payer: Self-pay | Admitting: Psychiatry

## 2021-08-26 ENCOUNTER — Ambulatory Visit (INDEPENDENT_AMBULATORY_CARE_PROVIDER_SITE_OTHER): Payer: Medicaid Other | Admitting: Psychiatry

## 2021-08-26 DIAGNOSIS — F4010 Social phobia, unspecified: Secondary | ICD-10-CM

## 2021-08-26 DIAGNOSIS — F319 Bipolar disorder, unspecified: Secondary | ICD-10-CM

## 2021-08-26 MED ORDER — CAPLYTA 42 MG PO CAPS: 42.0000 mg | ORAL_CAPSULE | Freq: Every day | ORAL | 3 refills | Status: DC

## 2021-08-26 MED ORDER — TRAZODONE HCL 100 MG PO TABS: 100.0000 mg | ORAL_TABLET | Freq: Every evening | ORAL | 3 refills | Status: DC | PRN

## 2021-08-26 MED ORDER — HYDROXYZINE PAMOATE 50 MG PO CAPS: 50.0000 mg | ORAL_CAPSULE | Freq: Three times a day (TID) | ORAL | 3 refills | Status: DC | PRN

## 2021-08-26 MED ORDER — DULOXETINE HCL 40 MG PO CPEP: 80.0000 mg | ORAL_CAPSULE | Freq: Every day | ORAL | 3 refills | Status: DC

## 2021-08-26 NOTE — Progress Notes (Signed)
BH MD/PA/NP OP Progress Note Virtual Visit via Telephone Note  I connected with Carmen Daniels on 08/26/21 at  2:30 PM EDT by telephone and verified that I am speaking with the correct person using two identifiers.  Location: Patient: home Provider: Clinic   I discussed the limitations, risks, security and privacy concerns of performing an evaluation and management service by telephone and the availability of in person appointments. I also discussed with the patient that there may be a patient responsible charge related to this service. The patient expressed understanding and agreed to proceed.   I provided 30 minutes of non-face-to-face time during this encounter.  08/26/2021 3:00 PM Carmen Daniels  MRN:  [id number]  Chief Complaint: "I have been okay" Mother "She has been doing pretty good"    HPI: 36 year-old female seen today for follow-up psychiatric evaluation. She has a psychiatric history of bipolar disorder, borderline personality disorder, anxiety, depression, and SI/SA. She is currently managed on Cymbalta 60 mg daily, Caplyta 42 mg daily, Hydroxyzine 50 mg 3 times daily, and trazodone 100 mg nightly as needed.  Patient is also prescribed Ambien 10 mg nightly, Subutex 2 mg, gabapentin 400 mg 4 times daily, and Klonopin which are all prescribed by her PCP.  She notes medications are somewhat effective in managing her psychiatric conditions.  Today she was unable to login virtually so her assessment was done over the phone.  During exam she was pleasant, cooperative, and engaged in conversation.  Her voice tone was decreased and her speech was slow.  She informed provider that she has been doing okay but notes that she has some depression, anxiety and increased pain.  Patient reports that she has been increasingly anxious and depressed because her friend recently committed suicide.  Provider conducted a GAD-7 and patient scored a 17.  Provider also conducted a PHQ-9 and patient scored  a 19.  She endorses adequate appetite (however notes that she has been eating junk food) and notes that she has had some weight gain.  Patient endorses hypersomnia noting that she sleeps approximately 15 hours nightly.  Today she denies SI/HI/VAH, mania, paranoia.    Patient was seen with her mother who notes that she has been doing pretty good.  She notes that she does not have manic-like behaviors now.  She does Artist that she has been grieving the loss of her deceased friend.  Patient notes that she continues to have back pain and foot pain.  She reports that she finds gabapentin and Cymbalta somewhat effective in managing her pain.  Today Cymbalta increased from 60 mg daily to 40 mg twice daily.  She will continue her other medications as prescribed.  No other concerns noted at this time.   Visit Diagnosis:    ICD-10-CM   1. Bipolar 1 disorder, depressed (HCC)  F31.9 DULoxetine 40 MG CPEP    lumateperone tosylate (CAPLYTA) 42 MG capsule    traZODone (DESYREL) 100 MG tablet    2. Social anxiety disorder  F40.10 DULoxetine 40 MG CPEP    hydrOXYzine (VISTARIL) 50 MG capsule    traZODone (DESYREL) 100 MG tablet        Past Psychiatric History: bipolar disorder, borderline personality disorder, anxiety, depression, and SI/SA Past Medical History:  Past Medical History:  Diagnosis Date   Allergic rhinitis    Anxiety    Asthma    Bipolar disorder ([place])    Borderline personality disorder ([place])    Bupropion overdose  Chronic pain syndrome    Depression    Fibromyalgia    generalized   Hallucinations    Migraines    Nasal polyps    OCD (obsessive compulsive disorder)    Ovarian cyst    left   Pneumonia    2021   PONV (postoperative nausea and vomiting)    Schizophrenia ([place])    schizoaffective    Past Surgical History:  Procedure Laterality Date   ABDOMINAL HYSTERECTOMY     LAPAROSCOPIC TUBAL LIGATION Bilateral 06/13/2019   Procedure: LAPAROSCOPIC TUBAL LIGATION;   Surgeon: Chancy Milroy, MD;  Location: [place];  Service: Gynecology;  Laterality: Bilateral;  FILSHIE CLIPS   NASAL SINUS SURGERY     TUBAL LIGATION     tubes in ears     VAGINAL HYSTERECTOMY Bilateral 01/15/2020   Procedure: HYSTERECTOMY VAGINAL;  Surgeon: Chancy Milroy, MD;  Location: [place];  Service: Gynecology;  Laterality: Bilateral;   WISDOM TOOTH EXTRACTION      Family Psychiatric History: Paternal grandmother depression Family History:  Family History  Problem Relation Age of Onset   Healthy Mother    Healthy Father    Arthritis Maternal Grandmother    Arthritis Maternal Grandfather    Depression Paternal Grandmother    Hyperlipidemia Other    Stroke Other    Hypertension Other    Cancer Other    COPD Other    Asthma Other     Social History:  Social History   Socioeconomic History   Marital status: Married    Spouse name: Not on file   Number of children: 0   Years of education: 14   Highest education level: Not on file  Occupational History   Occupation: Unemployed  Tobacco Use   Smoking status: Every Day    Packs/day: 0.50    Years: 15.00    Total pack years: 7.50    Types: Cigarettes   Smokeless tobacco: Never  Vaping Use   Vaping Use: Every day   Substances: Nicotine  Substance and Sexual Activity   Alcohol use: Never   Drug use: Yes    Comment: prescribed narcotics   Sexual activity: Not on file  Other Topics Concern   Not on file  Social History Narrative   Lives with parents.   Right-handed.   3 glasses of green tea per day.   Social Determinants of Health   Financial Resource Strain: Not on file  Food Insecurity: No Food Insecurity (10/29/2020)   Hunger Vital Sign    Worried About Running Out of Food in the Last Year: Never true    Ran Out of Food in the Last Year: Never true  Transportation Needs: Not on file  Physical Activity: Not on file  Stress: Not on file  Social Connections: Not on file     Allergies:  Allergies  Allergen Reactions   Keflex [Cephalexin] Anaphylaxis    Has tolerated amoxicillin 7846/9629    Metabolic Disorder Labs: Lab Results  Component Value Date   HGBA1C 4.7 (L) 02/07/2021   MPG 88.19 02/07/2021   No results found for: "PROLACTIN" Lab Results  Component Value Date   TRIG 341 (H) 04/03/2019   Lab Results  Component Value Date   TSH 0.972 02/15/2021   TSH 4.304 07/23/2020    Therapeutic Level Labs: Lab Results  Component Value Date   LITHIUM 0.4 (L) 04/24/2021   LITHIUM <0.06 (L) 07/30/2019   No results found for: "VALPROATE" No results  found for: "CBMZ"  Current Medications: Current Outpatient Medications  Medication Sig Dispense Refill   beclomethasone (QVAR) 40 MCG/ACT inhaler Inhale 1 puff into the lungs 2 (two) times daily as needed (asthma).      botulinum toxin Type A (BOTOX) 100 units SOLR injection Inject 200 Units into the muscle every 3 (three) months. Inject into head and neck muscles 2 each 3   buprenorphine (SUBUTEX) 2 MG SUBL SL tablet Place 4 mg under the tongue in the morning, at noon, in the evening, and at bedtime.     clonazePAM (KLONOPIN) 1 MG tablet Take 1 mg by mouth 4 (four) times daily.     DULoxetine 40 MG CPEP Take 80 mg by mouth daily. 60 capsule 3   Fremanezumab-vfrm (AJOVY) 225 MG/1.5ML SOSY INJECT 225 MG INTO THE SKIN EVERY 30 (THIRTY) DAYS. 1.5 mL 11   gabapentin (NEURONTIN) 400 MG capsule Take 400 mg by mouth 4 (four) times daily.     hydrOXYzine (VISTARIL) 50 MG capsule Take 1 capsule (50 mg total) by mouth 3 (three) times daily as needed for anxiety. 90 capsule 3   lumateperone tosylate (CAPLYTA) 42 MG capsule Take 1 capsule (42 mg total) by mouth daily. 30 capsule 3   Multiple Vitamins-Calcium (ONE-A-DAY WOMENS FORMULA PO) Take 1 tablet by mouth daily.     SUMAtriptan (IMITREX) 100 MG tablet May repeat in 2 hours if headache persists or recurs. Do no refill in less than 30 days 10 tablet 11    traZODone (DESYREL) 100 MG tablet Take 1 tablet (100 mg total) by mouth at bedtime as needed for sleep. 30 tablet 3   No current facility-administered medications for this visit.     Musculoskeletal: Strength & Muscle Tone:  Unable to assess due to telephone visit Gait & Station:  Unable to assess due to telephone visit Patient leans: N/A  Psychiatric Specialty Exam: Review of Systems  There were no vitals taken for this visit.There is no height or weight on file to calculate BMI.  General Appearance:  Unable to assess due to telephone visit  Eye Contact:   Unable to assess due to telephone visit  Speech:  Clear and Coherent and Slow  Volume:  Decreased  Mood:  Anxious and Depressed  Affect:  Congruent  Thought Process:  Coherent, Goal Directed and Linear  Orientation:  Full (Time, Place, and Person)  Thought Content: WDL and Logical   Suicidal Thoughts:  No  Homicidal Thoughts:  No  Memory:  Immediate;   Good Recent;   Good Remote;   Good  Judgement:  Good  Insight:  Good  Psychomotor Activity:  Normal  Concentration:  Concentration: Good and Attention Span: Good  Recall:  Good  Fund of Knowledge: Good  Language: Good  Akathisia:  No  Handed:  Right  AIMS (if indicated):Done  Assets:  Communication Skills Desire for Improvement Financial Resources/Insurance Housing Social Support  ADL's:  Intact  Cognition: WNL  Sleep:  Fair   Screenings: AIMS    Flowsheet Row Clinical Support from 11/26/2019 in [facility] Ad[facility] (Discharged) from 04/10/2017 in [place] [facility]  AIMS Total Score 5 0      GAD-7    [place] Office Visit from 08/26/2021 in [facility] Video Visit from 10/30/2020 in [facility] Video Visit from 08/11/2020 in [place] from 03/31/2020 in [facility]  Total  GAD-7 Score '17 20 13 18      '$ Mini-Mental    Flowsheet Row Office Visit from 10/05/2016 in [facility]  Total Score (max 30 points ) 26      PHQ2-9    [place] Visit from 08/26/2021 in [facility] Nutrition [facility] 10/29/2020 [facility] Video Visit from 08/11/2020 in [facility] ED from 08/05/2020 in [place] DEPT Clinical Support from 03/31/2020 in [facility]  PHQ-2 Total Score '6 3 4 4 6  '$ PHQ-9 Total Score '19 18 15 8 19      '$ [place] ED from 09/18/2020 in [place] ED from 09/16/2020 in [place] Video Visit from 08/11/2020 in [place] No Risk No Risk No Risk        Assessment and Plan: Patient endorse anxiety, depression, hypersomnia, and grief.  1. Bipolar 1 disorder, depressed ([place])  Continue- lumateperone tosylate (CAPLYTA) 42 MG capsule; Take 1 capsule (42 mg total) by mouth daily.  Dispense: 30 capsule; Refill: 3 Continue- traZODone (DESYREL) 100 MG tablet; Take 1 tablet (100 mg total) by mouth at bedtime as needed for sleep.  Dispense: 30 tablet; Refill: 3 Increase- DULoxetine 40 MG CPEP; Take 80 mg by mouth daily.  Dispense: 60 capsule; Refill: 3  2. Social anxiety disorder  Increase- DULoxetine 40 MG CPEP; Take 80 mg by mouth daily.  Dispense: 60 capsule; Refill: 3 Continue- hydrOXYzine (VISTARIL) 50 MG capsule; Take 1 capsule (50 mg total) by mouth 3 (three) times daily as needed for anxiety.  Dispense: 90 capsule; Refill: 3 Continue- traZODone (DESYREL) 100 MG tablet; Take 1 tablet (100 mg total) by mouth at bedtime as needed for sleep.  Dispense: 30 tablet; Refill: 3       Follow-up in 2 months. Follow-up with outpatient therapy   Salley Slaughter,  NP 08/26/2021, 3:00 PM

## 2021-08-27 ENCOUNTER — Telehealth: Payer: Self-pay

## 2021-08-27 ENCOUNTER — Ambulatory Visit: Payer: Medicaid Other

## 2021-08-27 NOTE — Telephone Encounter (Signed)
Spoke with pt's mother regarding her missed appointment. She reports she called yesterday to cancel due to pt having acute LBP. Confirmed next appointment and informed pt to call if there is a need to cancel.

## 2021-09-03 ENCOUNTER — Ambulatory Visit: Payer: Medicaid Other

## 2021-09-10 ENCOUNTER — Ambulatory Visit: Payer: Medicaid Other

## 2021-09-17 ENCOUNTER — Ambulatory Visit: Payer: Medicaid Other | Admitting: Neurology

## 2021-09-24 ENCOUNTER — Ambulatory Visit: Payer: Medicaid Other

## 2021-09-28 ENCOUNTER — Telehealth (HOSPITAL_COMMUNITY): Payer: Self-pay | Admitting: *Deleted

## 2021-09-28 NOTE — Telephone Encounter (Signed)
Patient Requested Refill   traZODone (DESYREL) 100 MG tablet  lumateperone tosylate (CAPLYTA) 42 MG capsule

## 2021-09-29 ENCOUNTER — Other Ambulatory Visit (HOSPITAL_COMMUNITY): Payer: Self-pay | Admitting: Psychiatry

## 2021-09-29 DIAGNOSIS — F4010 Social phobia, unspecified: Secondary | ICD-10-CM

## 2021-09-29 DIAGNOSIS — F319 Bipolar disorder, unspecified: Secondary | ICD-10-CM

## 2021-09-29 MED ORDER — CAPLYTA 42 MG PO CAPS: 42.0000 mg | ORAL_CAPSULE | Freq: Every day | ORAL | 3 refills | Status: DC

## 2021-09-29 MED ORDER — TRAZODONE HCL 100 MG PO TABS: 100.0000 mg | ORAL_TABLET | Freq: Every evening | ORAL | 3 refills | Status: DC | PRN

## 2021-09-29 NOTE — Telephone Encounter (Signed)
Medication refilled and sent to preferred pharmacy

## 2021-10-20 ENCOUNTER — Encounter: Payer: Self-pay | Admitting: Neurology

## 2021-10-20 ENCOUNTER — Telehealth: Payer: Self-pay

## 2021-10-20 ENCOUNTER — Ambulatory Visit (INDEPENDENT_AMBULATORY_CARE_PROVIDER_SITE_OTHER): Payer: Medicaid Other | Admitting: Neurology

## 2021-10-20 VITALS — BP 131/87 | HR 74

## 2021-10-20 DIAGNOSIS — G43719 Chronic migraine without aura, intractable, without status migrainosus: Secondary | ICD-10-CM | POA: Diagnosis not present

## 2021-10-20 MED ORDER — ONABOTULINUMTOXINA 200 UNITS IJ SOLR: 200.0000 [IU] | Freq: Once | INTRAMUSCULAR | Status: DC

## 2021-10-20 NOTE — Telephone Encounter (Signed)
Pt had botox injections today and provided VO for order to be shipped to the clinic. We will call back for shipment.

## 2021-10-20 NOTE — Progress Notes (Signed)
Botox injection for chronic migraine prevention, injection was performed according to [place] protocol,  5 units of Botox was injected into each side, for 31 injection sites, total of 155 units  Bilateral frontalis 4 injection sites Bilateral corrugate 2 injection sites Procerus 1 injection sites. Bilateral temporalis 8 injection sites Bilateral occipitalis 6 injection sites Bilateral cervical paraspinals 4 injection sites Bilateral upper trapezius 6 injection sites  Extra 45 unites were injected into bilateral temporoparietal region and upper cervical region  She suffered right ankle fracture after fall in boots, overall bilateral lower extremity strength recovered very well

## 2021-10-20 NOTE — Telephone Encounter (Signed)
I called [facility] at [phone] to schedule patient's Botox delivery.  They have been unable to reach patient to obtain consent for Botox treatment to our office.  Patient should call them back to provide this consent.  Once she has confirmed this is been completed we can call [facility] to schedule Botox delivery.

## 2021-10-20 NOTE — Progress Notes (Signed)
Botox 200 units x 1 vial [id number] Exp-02/26 SP

## 2021-10-28 IMAGING — CT CT ANGIO CHEST
2 of 6 series · 13 of 46 positions shown · IV contrast (APPLIED)
Comparison: Chest radiograph performed the same day, CT abdomen
pelvis dated 06/21/2019.

CLINICAL DATA: Lower abdominal pain, chest pain/pressure for 2
days, and shortness of breath. The patient is postoperative day 5
status post hysterectomy.

EXAM:
CT ANGIOGRAPHY CHEST
CT ABDOMEN AND PELVIS WITH CONTRAST
TECHNIQUE: Multidetector CT imaging of the chest was performed using the
standard protocol during bolus administration of intravenous
contrast. Multiplanar CT image reconstructions and MIPs were
obtained to evaluate the vascular anatomy. Multidetector CT imaging
of the abdomen and pelvis was performed using the standard protocol
during bolus administration of intravenous contrast.
CONTRAST:  100mL OMNIPAQUE IOHEXOL 350 MG/ML SOLN

[Series 6: coronal mpr · coronal · 0.53mm/px · 2 of 84 slices shown (1 of 2)]
[im 28/84  soft-tissue]
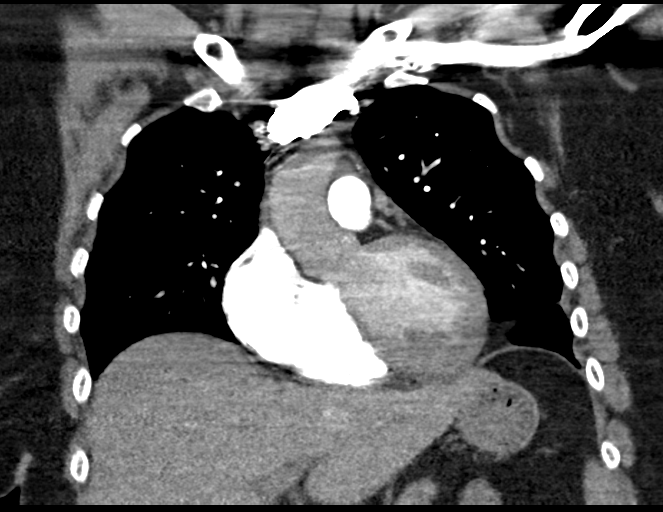
[im 56/84  soft-tissue]
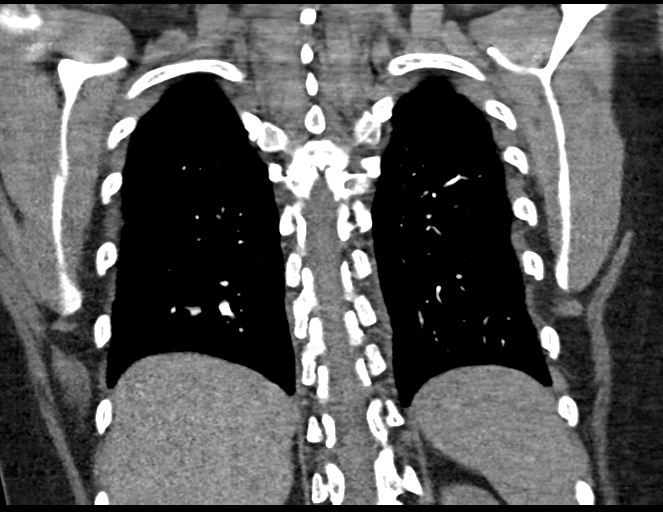

[Series 10: coronal mpr · axial · 0.51mm/px · z∈[-281,-51]mm · 11 of 266 slices shown (2 of 2)]
[im 24/266  lung]
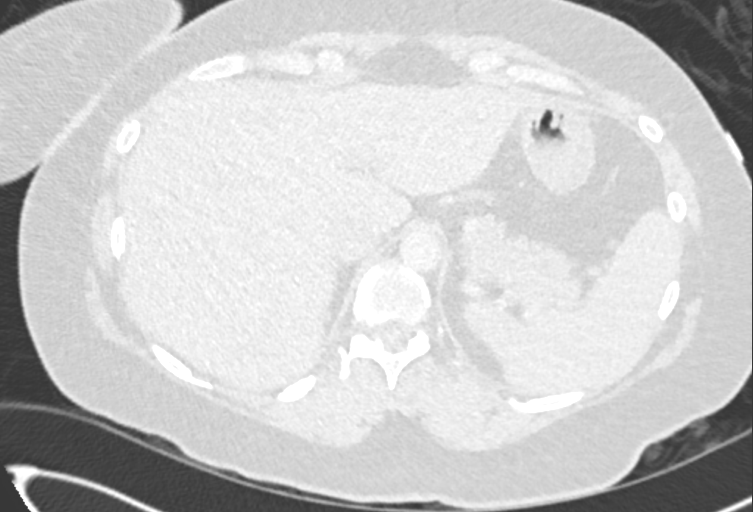
[im 47/266  soft-tissue]
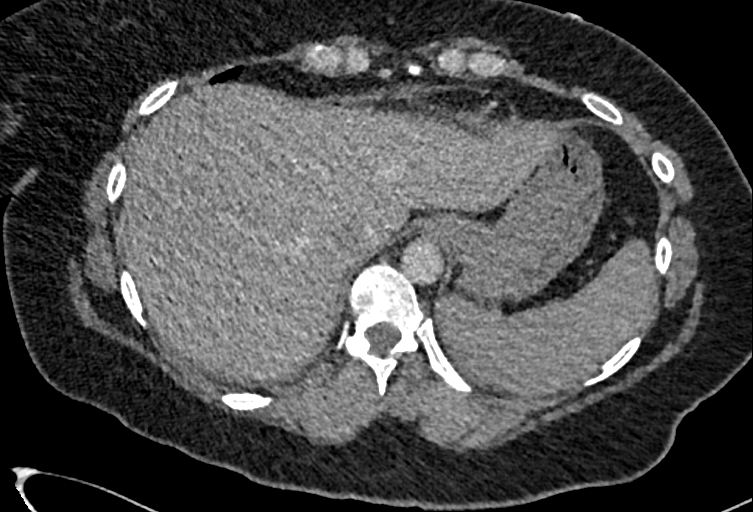
[im 70/266  lung]
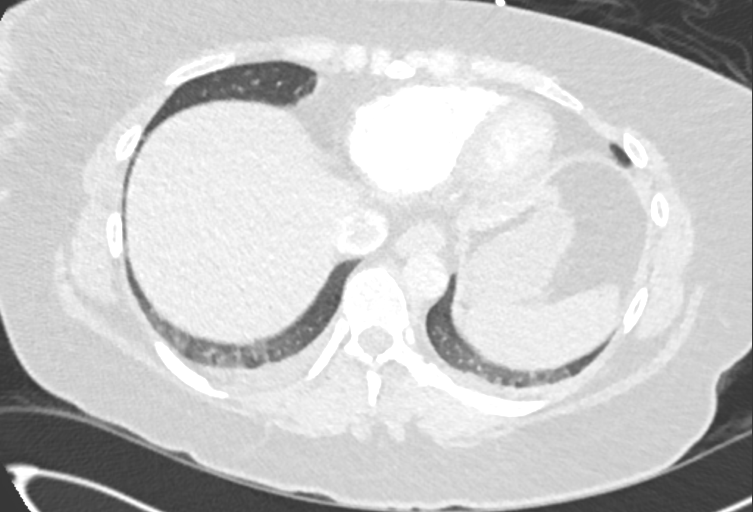
[im 93/266  soft-tissue]
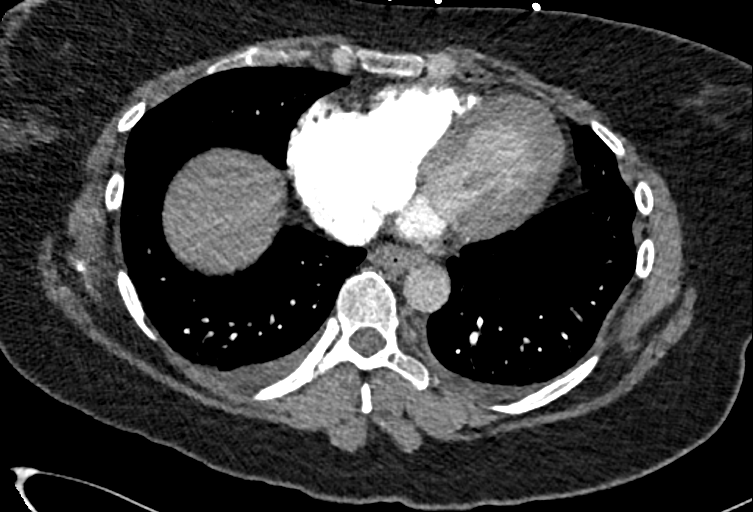
[im 116/266  lung]
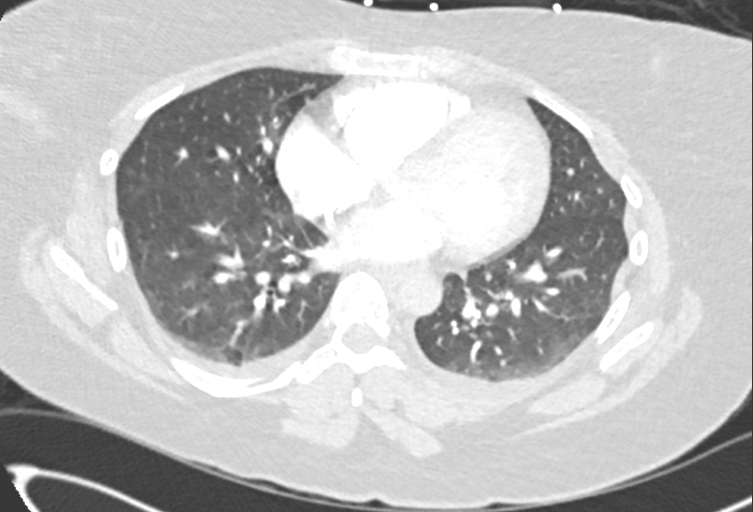
[im 139/266  soft-tissue]
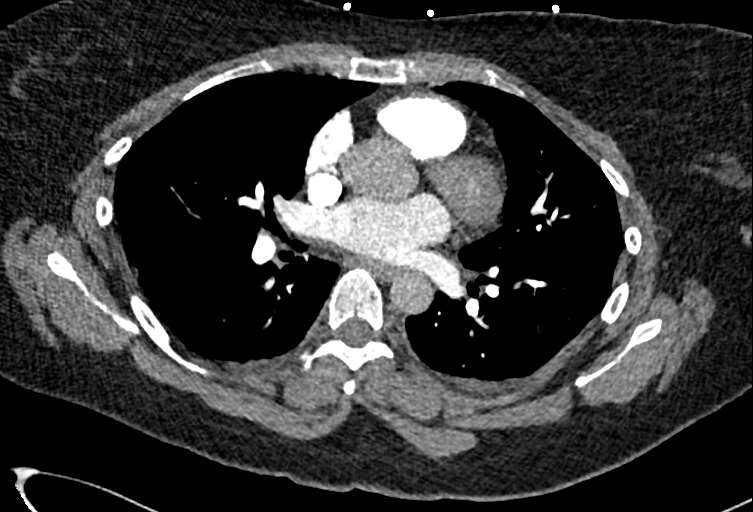
[im 162/266  lung]
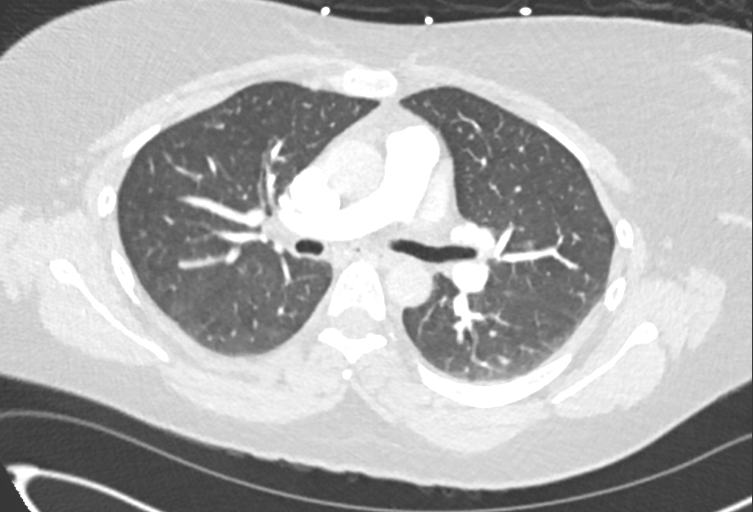
[im 185/266  soft-tissue]
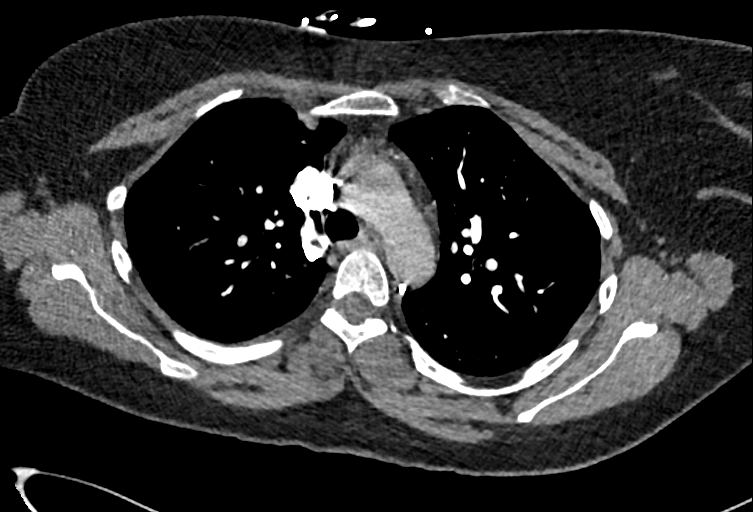
[im 208/266  lung]
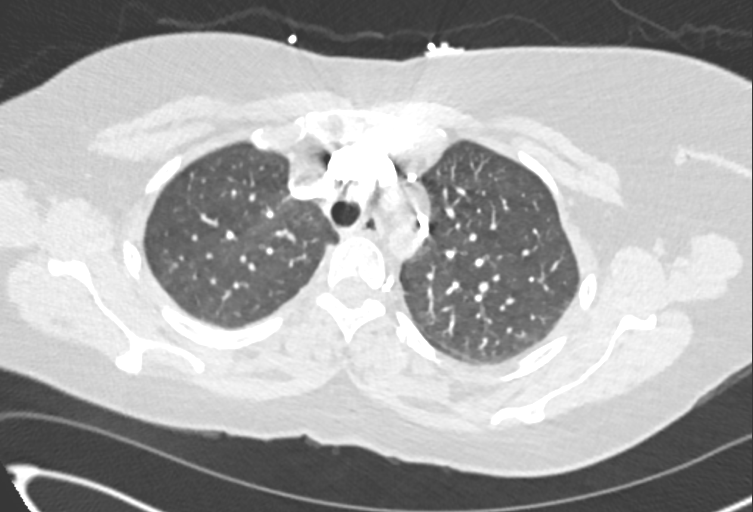
[im 231/266  soft-tissue]
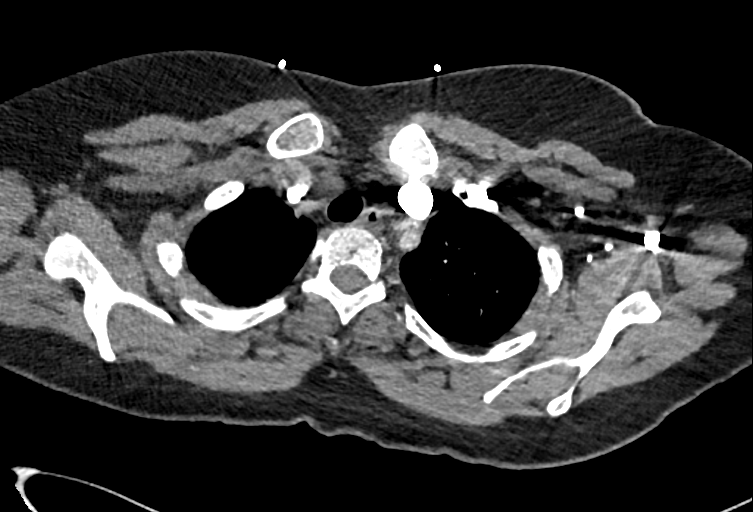
[im 254/266  lung]
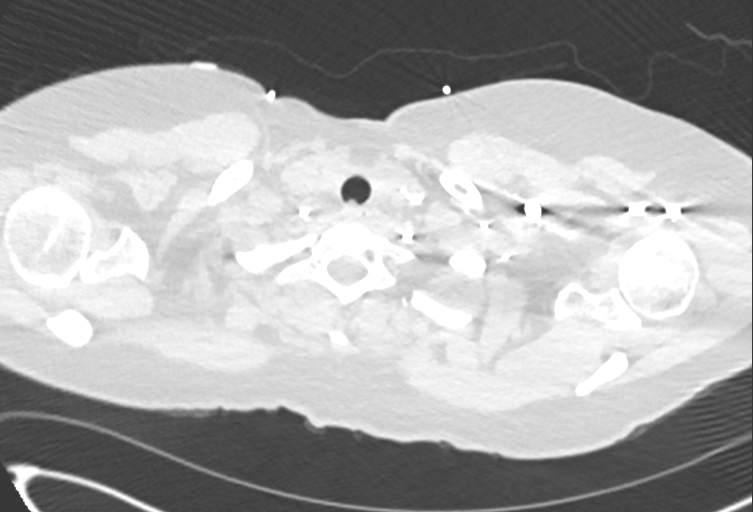

[13 of 46 positions shown; findings below may reference images not displayed]

FINDINGS: CTA CHEST FINDINGS

Cardiovascular: Satisfactory opacification of the pulmonary arteries
to the segmental level. No evidence of pulmonary embolism. Normal
heart size. No pericardial effusion.

Mediastinum/Nodes: No enlarged mediastinal, hilar, or axillary lymph
nodes. Thyroid gland, trachea, and esophagus demonstrate no
significant findings.

Lungs/Pleura: There are small bilateral pleural effusions with
associated atelectasis. There is no pneumothorax.

Musculoskeletal: No chest wall abnormality. No acute or significant
osseous findings.

Review of the MIP images confirms the above findings.

CT ABDOMEN and PELVIS FINDINGS

Hepatobiliary: No focal liver abnormality is seen. No gallstones,
gallbladder wall thickening, or biliary dilatation.

Pancreas: Unremarkable. No pancreatic ductal dilatation or
surrounding inflammatory changes.

Spleen: Normal in size without focal abnormality.

Adrenals/Urinary Tract: Adrenal glands are unremarkable. Kidneys are
normal, without renal calculi, focal lesion, or hydronephrosis.
Bladder is unremarkable.

Stomach/Bowel: Stomach is within normal limits. Appendix appears
normal. A few scattered colonic diverticula are noted. No evidence
of bowel wall thickening, distention, or inflammatory changes.

Vascular/Lymphatic: No significant vascular findings are present. No
enlarged abdominal or pelvic lymph nodes.

Reproductive: The patient is status post hysterectomy. No suspicious
adnexal masses are identified. A small amount of gas is seen in the
vagina. No fluid collection to suggest abscess is identified in the
operative bed.

Other: No abdominal wall hernia or abnormality. No abdominopelvic
ascites.

Musculoskeletal: No acute or significant osseous findings.

Review of the MIP images confirms the above findings.
IMPRESSION: 1. No evidence of pulmonary embolism.
2. Small bilateral pleural effusions with associated atelectasis.
3. No acute process in the abdomen or pelvis. No findings to explain
abdominal pain.

## 2021-10-28 IMAGING — CR DG CHEST 2V
2 series · 2 of 2 positions shown · non-contrast
Comparison: 04/05/2019

CLINICAL DATA: Chest pain and shortness of breath. Hysterectomy 5
days ago.

EXAM:
CHEST - 2 VIEW

[chest pa]
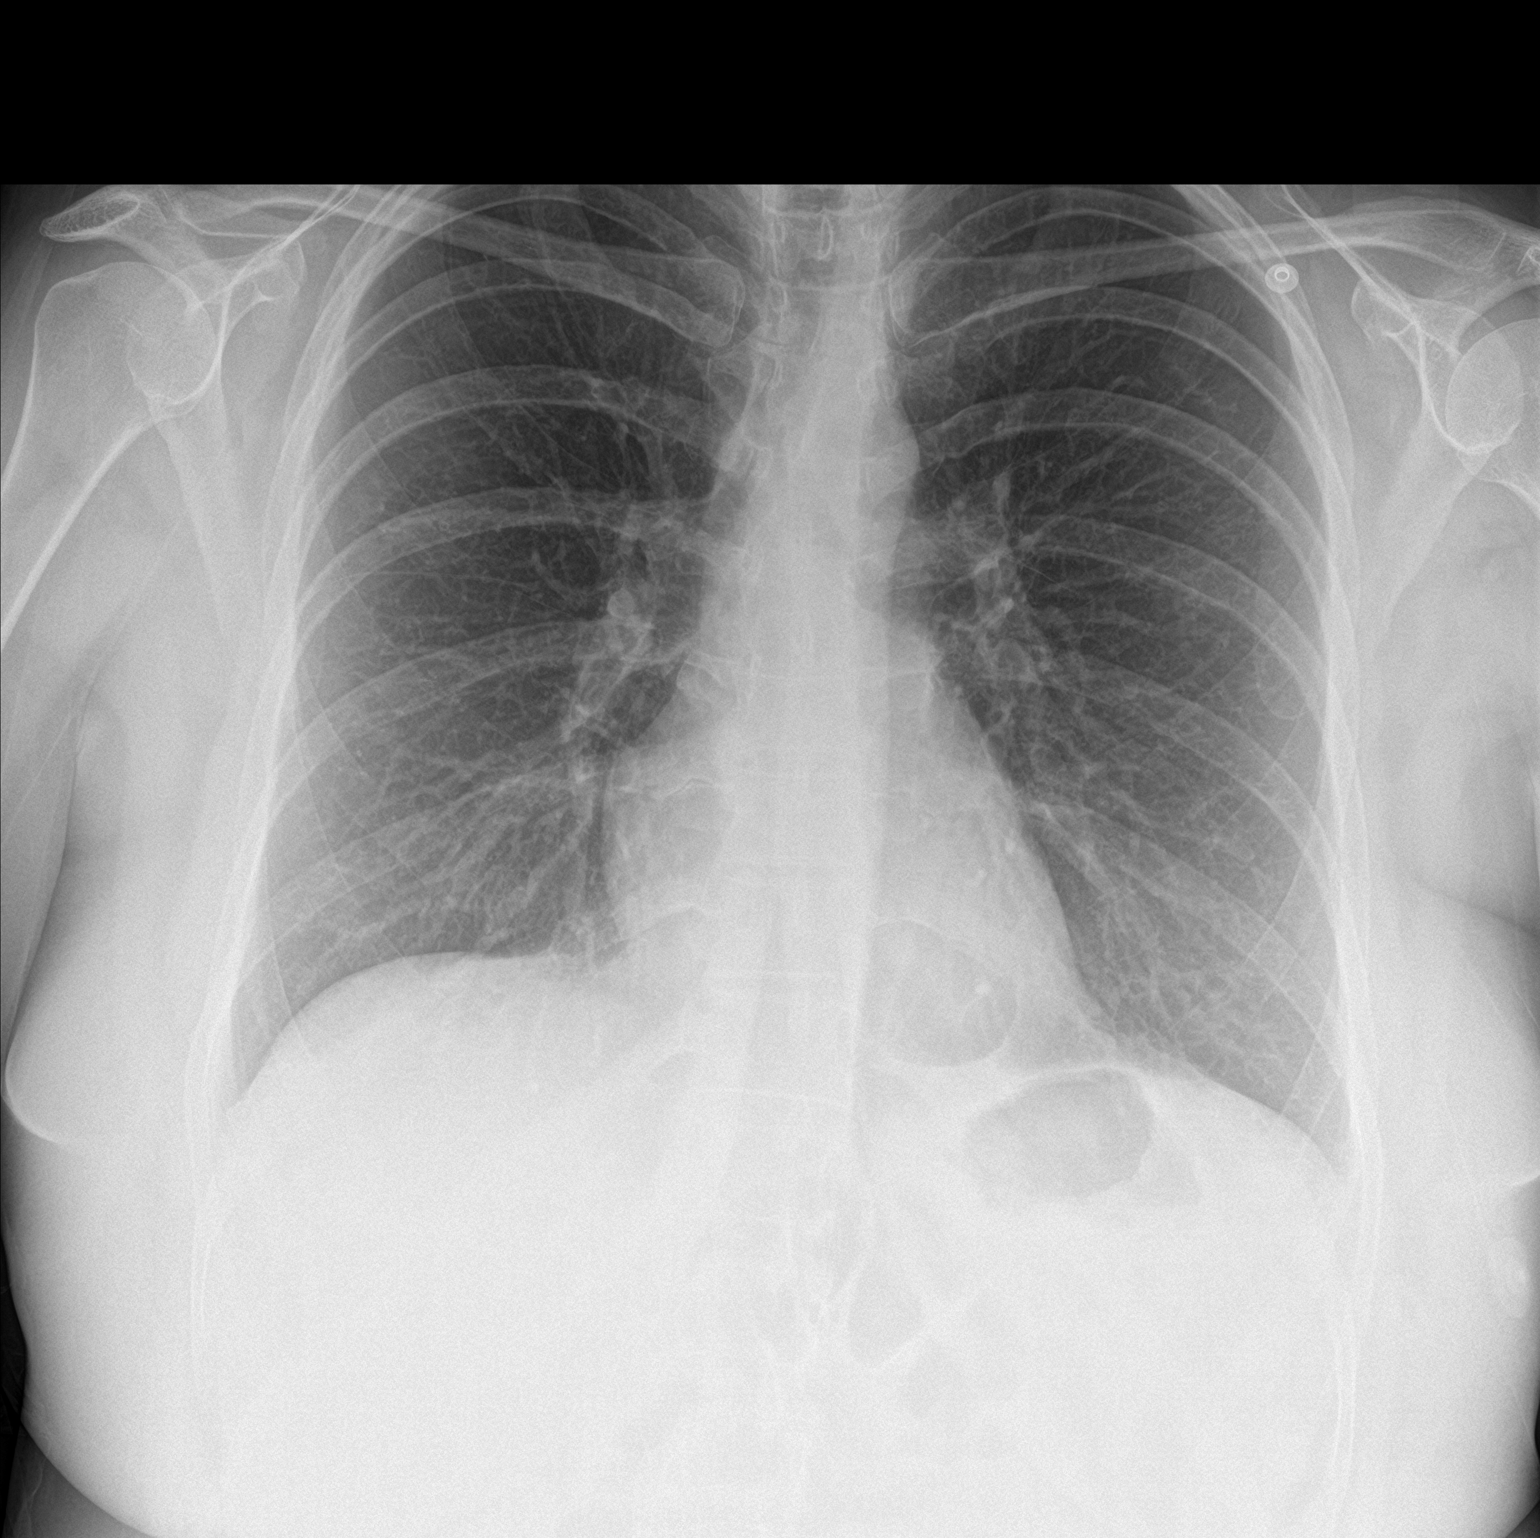

[chest lat]
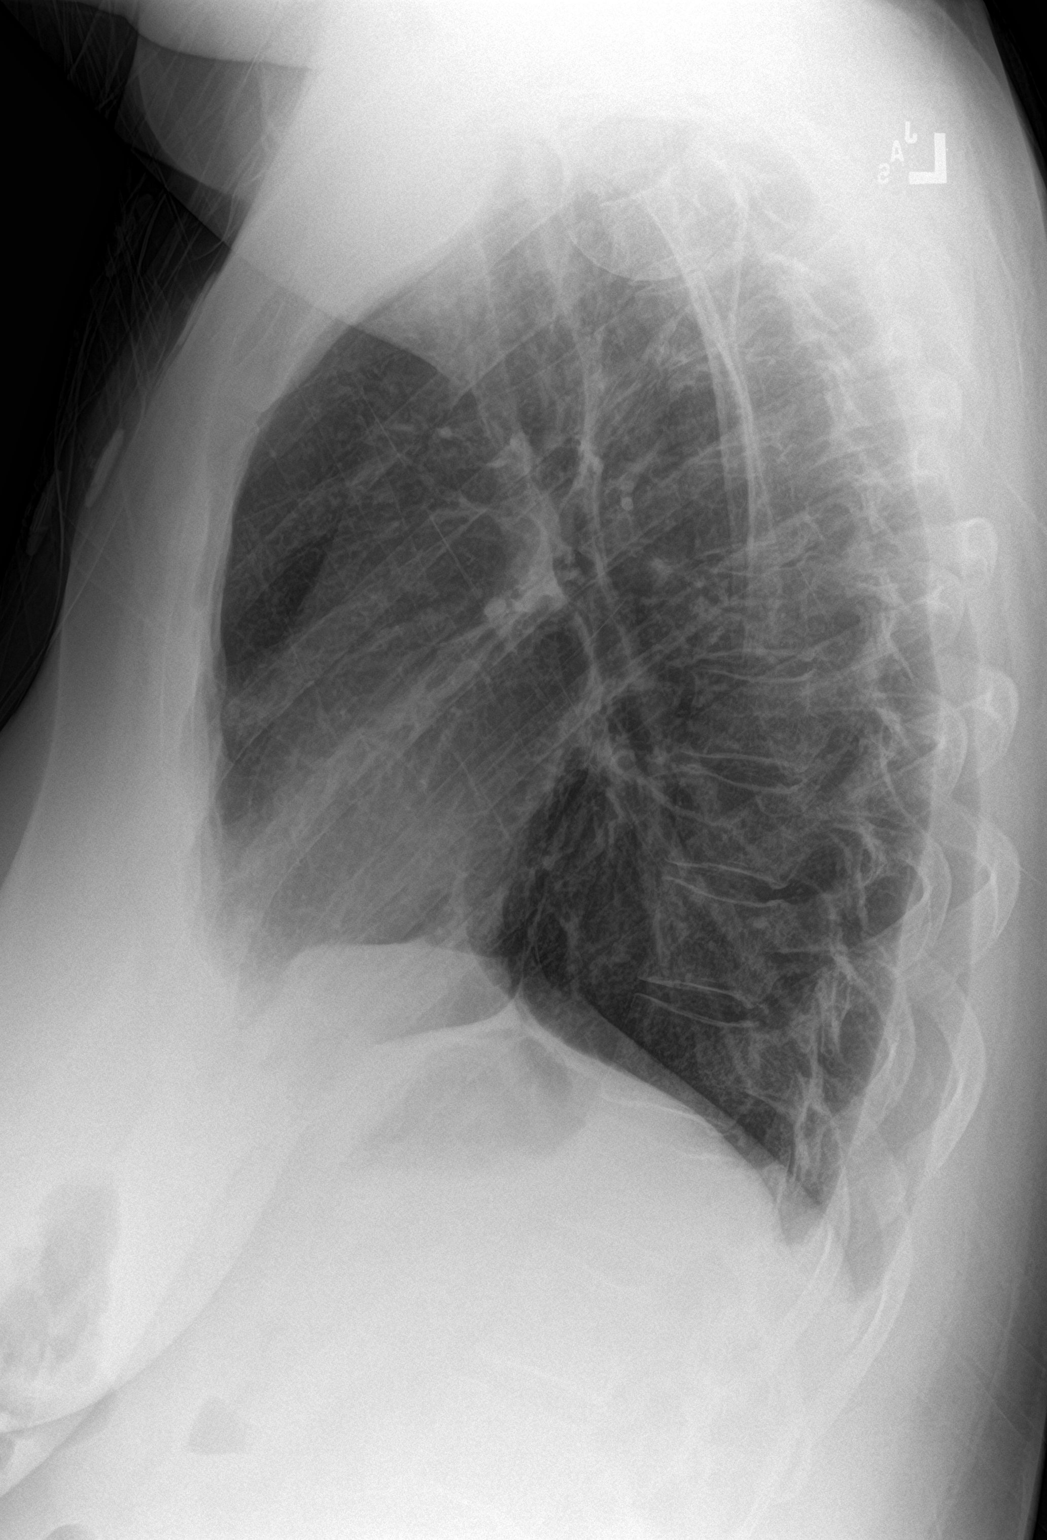

[2 of 2 positions shown; findings below may reference images not displayed]

FINDINGS: The heart size and mediastinal contours are within normal limits.
Both lungs are clear. The visualized skeletal structures are
unremarkable.
IMPRESSION: No active cardiopulmonary disease.

## 2021-11-21 ENCOUNTER — Other Ambulatory Visit: Payer: Self-pay | Admitting: Neurology

## 2021-11-21 DIAGNOSIS — F319 Bipolar disorder, unspecified: Secondary | ICD-10-CM

## 2021-11-30 ENCOUNTER — Ambulatory Visit (INDEPENDENT_AMBULATORY_CARE_PROVIDER_SITE_OTHER): Payer: Medicaid Other | Admitting: Psychiatry

## 2021-11-30 ENCOUNTER — Encounter (HOSPITAL_COMMUNITY): Payer: Self-pay | Admitting: Psychiatry

## 2021-11-30 DIAGNOSIS — F4010 Social phobia, unspecified: Secondary | ICD-10-CM | POA: Diagnosis not present

## 2021-11-30 DIAGNOSIS — F319 Bipolar disorder, unspecified: Secondary | ICD-10-CM | POA: Diagnosis not present

## 2021-11-30 MED ORDER — HYDROXYZINE PAMOATE 50 MG PO CAPS: 50.0000 mg | ORAL_CAPSULE | Freq: Three times a day (TID) | ORAL | 3 refills | Status: DC | PRN

## 2021-11-30 MED ORDER — CAPLYTA 42 MG PO CAPS: 42.0000 mg | ORAL_CAPSULE | Freq: Every day | ORAL | 3 refills | Status: DC

## 2021-11-30 MED ORDER — DULOXETINE HCL 60 MG PO CPEP: 120.0000 mg | ORAL_CAPSULE | Freq: Every day | ORAL | 3 refills | Status: DC

## 2021-11-30 MED ORDER — TRAZODONE HCL 100 MG PO TABS: 100.0000 mg | ORAL_TABLET | Freq: Every evening | ORAL | 3 refills | Status: DC | PRN

## 2021-11-30 NOTE — Progress Notes (Signed)
[place] MD/PA/NP OP Progress Note Virtual Visit via Telephone Note  I connected with Carmen Daniels on 11/30/21 at  1:00 PM EDT by telephone and verified that I am speaking with the correct person using two identifiers.  Location: Patient: home Provider: Clinic   I discussed the limitations, risks, security and privacy concerns of performing an evaluation and management service by telephone and the availability of in person appointments. I also discussed with the patient that there may be a patient responsible charge related to this service. The patient expressed understanding and agreed to proceed.   I provided 30 minutes of non-face-to-face time during this encounter.  11/30/2021 2:35 PM Carmen Daniels  MRN:  [id number]  Chief Complaint: "I have been okay" Mother "I  think she is doing okay"    HPI: 35 year-old female seen today for follow-up psychiatric evaluation. She has a psychiatric history of bipolar disorder, borderline personality disorder, anxiety, depression, and SI/SA. She is currently managed on Cymbalta 80 mg daily, Caplyta 42 mg daily, Hydroxyzine 50 mg 3 times daily, and trazodone 100 mg nightly as needed.  Patient is also prescribed Ambien 10 mg nightly, Subutex 2 mg, gabapentin 400 mg 4 times daily, and Klonopin which are all prescribed by her PCP.  She notes medications are somewhat effective in managing her psychiatric conditions.  Today she was unable to login virtually so her assessment was done over the phone.  During exam she was pleasant, cooperative, and engaged in conversation.  She informed Probation officer that she has been doing okay.  She notes that recently she broke her foot after falling.  Patient reports that she has pain.  She reports gabapentin and Cymbalta are somewhat effective in managing her pain.  Overall patient notes that her mood is stable.  At times she notes that she does have anxiety and depression however requested that a GAD-7 and PHQ-9 not be done today.   She endorsed adequate sleep and appetite.  Today she denies SI/HI/VAH, mania, paranoia.    Patient informed writer that at times she has dry mouth.  Provider informed patient that hydroxyzine can cause dry mouth and encouraged her to cut dose in half and stay hydrated to prevent this side effect.  She endorsed understanding and agreed.  Today patient agreeable to increasing Cymbalta 80 mg to 120 mg to help manage anxiety, depression, and pain.  She will continue all other medications as prescribed.  No other concerns at this time.     Visit Diagnosis:    ICD-10-CM   1. Bipolar 1 disorder, depressed (HCC)  F31.9 DULoxetine (CYMBALTA) 60 MG capsule    traZODone (DESYREL) 100 MG tablet    lumateperone tosylate (CAPLYTA) 42 MG capsule    2. Social anxiety disorder  F40.10 hydrOXYzine (VISTARIL) 50 MG capsule    traZODone (DESYREL) 100 MG tablet        Past Psychiatric History: bipolar disorder, borderline personality disorder, anxiety, depression, and SI/SA Past Medical History:  Past Medical History:  Diagnosis Date   Allergic rhinitis    Anxiety    Asthma    Bipolar disorder ([place])    Borderline personality disorder ([place])    Bupropion overdose    Chronic pain syndrome    Depression    Fibromyalgia    generalized   Hallucinations    Migraines    Nasal polyps    OCD (obsessive compulsive disorder)    Ovarian cyst    left   Pneumonia    2021  PONV (postoperative nausea and vomiting)    Schizophrenia ([place])    schizoaffective    Past Surgical History:  Procedure Laterality Date   ABDOMINAL HYSTERECTOMY     LAPAROSCOPIC TUBAL LIGATION Bilateral 06/13/2019   Procedure: LAPAROSCOPIC TUBAL LIGATION;  Surgeon: Chancy Milroy, MD;  Location: [place];  Service: Gynecology;  Laterality: Bilateral;  FILSHIE CLIPS   NASAL SINUS SURGERY     TUBAL LIGATION     tubes in ears     VAGINAL HYSTERECTOMY Bilateral 01/15/2020   Procedure: HYSTERECTOMY VAGINAL;   Surgeon: Chancy Milroy, MD;  Location: [place];  Service: Gynecology;  Laterality: Bilateral;   WISDOM TOOTH EXTRACTION      Family Psychiatric History: Paternal grandmother depression Family History:  Family History  Problem Relation Age of Onset   Healthy Mother    Healthy Father    Arthritis Maternal Grandmother    Arthritis Maternal Grandfather    Depression Paternal Grandmother    Hyperlipidemia Other    Stroke Other    Hypertension Other    Cancer Other    COPD Other    Asthma Other     Social History:  Social History   Socioeconomic History   Marital status: Married    Spouse name: Not on file   Number of children: 0   Years of education: 14   Highest education level: Not on file  Occupational History   Occupation: Unemployed  Tobacco Use   Smoking status: Every Day    Packs/day: 0.50    Years: 15.00    Total pack years: 7.50    Types: Cigarettes   Smokeless tobacco: Never  Vaping Use   Vaping Use: Every day   Substances: Nicotine  Substance and Sexual Activity   Alcohol use: Never   Drug use: Yes    Comment: prescribed narcotics   Sexual activity: Not on file  Other Topics Concern   Not on file  Social History Narrative   Lives with parents.   Right-handed.   3 glasses of green tea per day.   Social Determinants of Health   Financial Resource Strain: Not on file  Food Insecurity: No Food Insecurity (10/29/2020)   Hunger Vital Sign    Worried About Running Out of Food in the Last Year: Never true    Ran Out of Food in the Last Year: Never true  Transportation Needs: Not on file  Physical Activity: Not on file  Stress: Not on file  Social Connections: Not on file    Allergies:  Allergies  Allergen Reactions   Keflex [Cephalexin] Anaphylaxis    Has tolerated amoxicillin 4010/2725    Metabolic Disorder Labs: Lab Results  Component Value Date   HGBA1C 4.7 (L) 02/07/2021   MPG 88.19 02/07/2021   No results found for: "PROLACTIN" Lab  Results  Component Value Date   TRIG 341 (H) 04/03/2019   Lab Results  Component Value Date   TSH 0.972 02/15/2021   TSH 4.304 07/23/2020    Therapeutic Level Labs: Lab Results  Component Value Date   LITHIUM 0.4 (L) 04/24/2021   LITHIUM <0.06 (L) 07/30/2019   No results found for: "VALPROATE" No results found for: "CBMZ"  Current Medications: Current Outpatient Medications  Medication Sig Dispense Refill   beclomethasone (QVAR) 40 MCG/ACT inhaler Inhale 1 puff into the lungs 2 (two) times daily as needed (asthma).      botulinum toxin Type A (BOTOX) 100 units SOLR injection Inject 200 Units into  the muscle every 3 (three) months. Inject into head and neck muscles 2 each 3   buprenorphine (SUBUTEX) 2 MG SUBL SL tablet Place 4 mg under the tongue in the morning, at noon, in the evening, and at bedtime.     clonazePAM (KLONOPIN) 1 MG tablet Take 1 mg by mouth 4 (four) times daily.     DULoxetine (CYMBALTA) 60 MG capsule Take 2 capsules (120 mg total) by mouth daily. 60 capsule 3   DULoxetine 40 MG CPEP Take 80 mg by mouth daily. 60 capsule 3   Fremanezumab-vfrm (AJOVY) 225 MG/1.5ML SOSY INJECT 225 MG INTO THE SKIN EVERY 30 (THIRTY) DAYS. 1.5 mL 11   gabapentin (NEURONTIN) 400 MG capsule Take 400 mg by mouth 4 (four) times daily.     hydrOXYzine (VISTARIL) 50 MG capsule Take 1 capsule (50 mg total) by mouth 3 (three) times daily as needed for anxiety. 90 capsule 3   lumateperone tosylate (CAPLYTA) 42 MG capsule Take 1 capsule (42 mg total) by mouth daily. 30 capsule 3   Multiple Vitamins-Calcium (ONE-A-DAY WOMENS FORMULA PO) Take 1 tablet by mouth daily.     SUMAtriptan (IMITREX) 100 MG tablet May repeat in 2 hours if headache persists or recurs. Do no refill in less than 30 days 10 tablet 11   traZODone (DESYREL) 100 MG tablet Take 1 tablet (100 mg total) by mouth at bedtime as needed for sleep. 30 tablet 3   Current Facility-Administered Medications  Medication Dose Route  Frequency Provider Last Rate Last Admin   botulinum toxin Type A (BOTOX) injection 200 Units  200 Units Intramuscular Once Marcial Pacas, MD         Musculoskeletal: Strength & Muscle Tone:  Unable to assess due to telephone visit [place]:  Unable to assess due to telephone visit Patient leans: N/A  Psychiatric Specialty Exam: Review of Systems  There were no vitals taken for this visit.There is no height or weight on file to calculate BMI.  General Appearance:  Unable to assess due to telephone visit  Eye Contact:   Unable to assess due to telephone visit  Speech:  Clear and Coherent and Normal Rate  Volume:  Normal  Mood:  Anxious and Depressed  Affect:  Congruent  Thought Process:  Coherent, Goal Directed and Linear  Orientation:  Full (Time, Place, and Person)  Thought Content: WDL and Logical   Suicidal Thoughts:  No  Homicidal Thoughts:  No  Memory:  Immediate;   Good Recent;   Good Remote;   Good  Judgement:  Good  Insight:  Good  Psychomotor Activity:   Unable to assess due to telephone visit  Concentration:  Concentration: Good and Attention Span: Good  Recall:  Good  Fund of Knowledge: Good  Language: Good  Akathisia:   Unable to assess due to telephone visit  Handed:  Right  AIMS (if indicated):Done  Assets:  Communication Skills Desire for Improvement Financial Resources/Insurance Housing Social Support  ADL's:  Intact  Cognition: WNL  Sleep:  Good   Screenings: AIMS    Flowsheet Row Clinical Support from 11/26/2019 in [facility] Admission (Discharged) from 04/10/2017 in [facility] [facility]  AIMS Total Score 5 0      GAD-7    [place] Office Visit from 08/26/2021 in [facility] Video Visit from 10/30/2020 in [facility] Video Visit from 08/11/2020 in [place]  from 03/31/2020 in [facility]  Total GAD-7 Score '17 20 13 18      '$ Mini-Mental    Flowsheet Row Office Visit from 10/05/2016 in [facility]  Total Score (max 30 points ) 26      PHQ2-9    [place] Visit from 08/26/2021 in [facility] Nutrition [facility] 10/29/2020 [facility] Video Visit from 08/11/2020 in [facility] ED from 08/05/2020 in [place] DEPT Clinical Support from 03/31/2020 in [facility]  PHQ-2 Total Score '6 3 4 4 6  '$ PHQ-9 Total Score '19 18 15 8 19      '$ [place] ED from 09/18/2020 in [place] ED from 09/16/2020 in [place] Video Visit from 08/11/2020 in [place] No Risk No Risk No Risk        Assessment and Plan: Patient endorse anxiety, pain, and depression.Today patient agreeable to increasing Cymbalta 80 mg to 120 mg to help manage anxiety, depression, and pain.  She will continue all other medications as prescribed. Patient informed writer that at times she has dry mouth.  Provider informed patient that hydroxyzine can cause dry mouth and encouraged her to cut dose in half and stay hydrated to prevent this side effect.  She endorsed understanding and agreed.   1. Bipolar 1 disorder, depressed (HCC)  Increased- DULoxetine (CYMBALTA) 60 MG capsule; Take 2 capsules (120 mg total) by mouth daily.  Dispense: 60 capsule; Refill: 3 Continue- traZODone (DESYREL) 100 MG tablet; Take 1 tablet (100 mg total) by mouth at bedtime as needed for sleep.  Dispense: 30 tablet; Refill: 3 Continue- lumateperone tosylate (CAPLYTA) 42 MG capsule; Take 1 capsule (42 mg total) by mouth daily.  Dispense: 30 capsule; Refill: 3  2. Social anxiety disorder  Continue- hydrOXYzine  (VISTARIL) 50 MG capsule; Take 1 capsule (50 mg total) by mouth 3 (three) times daily as needed for anxiety.  Dispense: 90 capsule; Refill: 3 Continue- traZODone (DESYREL) 100 MG tablet; Take 1 tablet (100 mg total) by mouth at bedtime as needed for sleep.  Dispense: 30 tablet; Refill: 3      Follow-up in 2 months. Follow-up with outpatient therapy   Salley Slaughter, NP 11/30/2021, 2:35 PM

## 2021-12-08 NOTE — Therapy (Signed)
OUTPATIENT PHYSICAL THERAPY LOWER EXTREMITY EVALUATION   Patient Name: JAMY WHYTE MRN: [id number] DOB:12-07-1985, 36 y.o., female Today's Date: 12/09/2021   PT End of Session - 12/09/21 1638     Visit Number 1    Number of Visits 17    Date for PT Re-Evaluation 02/10/22    Authorization Type [employer] MCD    Authorization Time Period Pending auth    PT Start Time 1625   Pt arrived 10 minutes late to her eval.   PT Stop Time 1700    PT Time Calculation (min) 35 min    Activity Tolerance Patient tolerated treatment well;Patient limited by pain    Behavior During Therapy [facility] for tasks assessed/performed             Past Medical History:  Diagnosis Date   Allergic rhinitis    Anxiety    Asthma    Bipolar disorder ([place])    Borderline personality disorder ([place])    Bupropion overdose    Chronic pain syndrome    Depression    Fibromyalgia    generalized   Hallucinations    Migraines    Nasal polyps    OCD (obsessive compulsive disorder)    Ovarian cyst    left   Pneumonia    2021   PONV (postoperative nausea and vomiting)    Schizophrenia ([place])    schizoaffective   Past Surgical History:  Procedure Laterality Date   ABDOMINAL HYSTERECTOMY     LAPAROSCOPIC TUBAL LIGATION Bilateral 06/13/2019   Procedure: LAPAROSCOPIC TUBAL LIGATION;  Surgeon: Chancy Milroy, MD;  Location: [place];  Service: Gynecology;  Laterality: Bilateral;  FILSHIE CLIPS   NASAL SINUS SURGERY     TUBAL LIGATION     tubes in ears     VAGINAL HYSTERECTOMY Bilateral 01/15/2020   Procedure: HYSTERECTOMY VAGINAL;  Surgeon: Chancy Milroy, MD;  Location: [place];  Service: Gynecology;  Laterality: Bilateral;   WISDOM TOOTH EXTRACTION     Patient Active Problem List   Diagnosis Date Noted   Intentional overdose ([place]) 02/07/2021   Weakness 08/19/2020   Gait abnormality 08/19/2020   Neuropathic pain 08/19/2020   Malnutrition of moderate degree 07/01/2020   Acute  renal failure ([place]) 06/30/2020   Non-traumatic rhabdomyolysis    Elevated LFTs    Tardive dyskinesia 11/14/2019   Bipolar I disorder, most recent episode depressed ([place]) 09/13/2019   Social anxiety disorder 09/13/2019   Adjustment disorder with mixed anxiety and depressed mood 07/31/2019   Suicidal ideation    Post-operative state 07/19/2019   Seizures ([place]) 06/08/2019   Bipolar 1 disorder, depressed ([place]) 05/17/2019   Status epilepticus ([place]) 05/17/2019   Intentional benzodiazepine overdose ([place]) 03/29/2019   Intractable chronic 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 status migrainosus 02/08/2019   History of elective abortion 12/28/2018   QT prolongation 07/03/2018   AMS (altered mental status) 07/03/2018   Serum total bilirubin elevated 07/03/2018   Syncope 07/02/2018   Borderline personality disorder ([place]) 04/11/2017   Bipolar I disorder, current or most recent episode manic, with psychotic features ([place]) 04/10/2017   Bipolar disorder ([place]) 04/10/2017   Abnormal MRI of head 04/21/2016   Chronic migraine without aura 03/27/2015   Mild persistent asthma 12/30/2014   Allergic rhinitis due to pollen 12/30/2014   Rhinitis medicamentosa 12/30/2014   Nasal polyposis 12/30/2014    PCP: [name], MD  REFERRING PROVIDER: Erle Crocker, MD  REFERRING DIAG: S/P RIGHT FOOT FX  THERAPY  DIAG:  Pain in right ankle and joints of right foot  Muscle weakness (generalized)  Unsteadiness on feet  Localized edema  Rationale for Evaluation and Treatment Rehabilitation  ONSET DATE: 12 weeks ago  SUBJECTIVE:   SUBJECTIVE STATEMENT: Pt reports primary c/o Rt foot and ankle pain following fx to all 5 Rt metatarsals about 12 weeks ago when the pt tripped over her new kitten. Imaging is currently unavailable in Epic. Pt was in a lower leg cast for 11 week. She had her cast removed last week and is now wearing a boot. She is currently WBAT with the boot. Current pain is 9/10. Worst pain  is 10/10. Best pain is 9/10.  Aggravating pain is walking, standing, laying prone. Easing factors include rest and self massage. She also endorses tingling in her feet from previous injuries which have also been treated at [facility].   PERTINENT HISTORY: Depression, anxiety, asthma, fibromyalgia, Bipolar disorder, Rt metatarsal fx x5 12 weeks ago  PAIN:  Are you having pain? Yes: NPRS scale: 9/10 Pain location: Rt foot Pain description: Sharp, tingling Aggravating factors: Standing, walking, laying prone Relieving factors: Rest, self massage  PRECAUTIONS: Fall  WEIGHT BEARING RESTRICTIONS Yes WBAT with boot  FALLS:  Has patient fallen in last 6 months? Yes. Number of falls 1, related to current injury  LIVING ENVIRONMENT: Lives with: lives with their family Lives in: House/apartment Stairs: Yes: External: 4-5 steps; on right going up, on left going up, and can reach both Has following equipment at home: Single point cane, Walker - 2 wheeled, Crutches, and Wheelchair (manual)  OCCUPATION: Unemployed  PLOF: Independent  PATIENT GOALS Return to walking, driving, swimming, going to haunted houses, ADLs   OBJECTIVE:   DIAGNOSTIC FINDINGS: None available in EPIC  PATIENT SURVEYS:  LEFS 7/80  COGNITION:  Overall cognitive status: Within functional limits for tasks assessed     SENSATION: Not tested  EDEMA:  Figure 8: 59cm on Lt, 61cm on Rt  MUSCLE LENGTH: Gastroc: moderately tight BIL Soleus: moderately tight BIL  PALPATION: TTP to global Rt foot and ankle  LOWER EXTREMITY ROM:  A/PROM Right eval Left eval  Ankle dorsiflexion -2p!/ 0p! 3/9  Ankle plantarflexion 55p!/58p! 53/58  Ankle inversion 15p!/20p! 35/42  Ankle eversion 25p!/30p! 20/38  Great toe extension 30 35   (Blank rows = not tested)  LOWER EXTREMITY MMT:  MMT Right eval Left eval  Hip flexion 5/5 5/5  Hip extension 5/5 5/5  Hip abduction 5/5 5/5  Knee flexion 5/5 5/5  Knee extension  5/5 5/5  Ankle dorsiflexion 5/5p! 5/5  Ankle plantarflexion 5/5p! 5/5  Ankle inversion 3/5p! 4+/5  Ankle eversion 4/5p! 3/5   (Blank rows = not tested)  FUNCTIONAL TESTS:  5xSTS: Unable Squat: Unable DL heel raise: Unable  GAIT: Distance walked: Unable Assistive device utilized:  N/A Level of assistance: N/A Comments: N/A    TODAY'S TREATMENT:  [facility] Adult PT Treatment:                                                DATE: 12/08/2021 Therapeutic Exercise: Demonstration of HEP with pt education Pt education on accessing HEP via MedBridge Manual Therapy: N/A Neuromuscular re-ed: N/A Therapeutic Activity: N/A Modalities: N/A Self Care: N/A    PATIENT EDUCATION:  Education details: Pt educated on POC, protocol, prognosis, LEFS, and HEP Person  educated: Patient and Parent Education method: Explanation, Demonstration, and Handouts Education comprehension: verbalized understanding and returned demonstration   HOME EXERCISE PROGRAM: Access Code: [email or web address] URL: [email or web address] Date: 12/09/2021 Prepared by: Vanessa [place]  Exercises - Seated Toe Towel Scrunches  - 1 x daily - 7 x weekly - 3 sets - 10 reps - Seated Heel Raise  - 1 x daily - 7 x weekly - 3 sets - 10 reps - Seated Toe Flexion Extension PROM  - 1 x daily - 7 x weekly - 3 sets - 20 reps - Ankle Inversion Eversion Towel Slide  - 1 x daily - 7 x weekly - 3 sets - 10 reps - Long Sitting Plantar Fascia Stretch with Towel  - 1 x daily - 7 x weekly - 2 sets - 1 minute hold  ASSESSMENT:  CLINICAL IMPRESSION: Patient is a 36 y.o. F who was seen today for physical therapy evaluation and treatment for Rt foot and ankle pain following metatarsal fx x5 twelve weeks ago.  Upon assessment, her primary impairments include limited and painful global Rt ankle AROM, painful global Rt ankle MMT with pain with ankle DF and PF, Rt foot/ ankle swelling, tight BIL gastroc/ soleus, TTP to global Rt foot/  ankle, and limited functional mobility. Pt will benefit from skilled PT to address her primary impairments and return to her prior level of function with less limitation.   OBJECTIVE IMPAIRMENTS Abnormal gait, decreased activity tolerance, decreased balance, decreased coordination, decreased endurance, decreased knowledge of use of DME, decreased mobility, difficulty walking, decreased ROM, decreased strength, hypomobility, increased edema, impaired flexibility, impaired sensation, improper body mechanics, postural dysfunction, and pain.   ACTIVITY LIMITATIONS carrying, lifting, bending, sitting, standing, squatting, sleeping, stairs, transfers, bed mobility, bathing, toileting, dressing, hygiene/grooming, and locomotion level  PARTICIPATION LIMITATIONS: meal prep, cleaning, laundry, driving, shopping, community activity, and yard work  PERSONAL FACTORS Past/current experiences and 3+ comorbidities: See medical hx  are also affecting patient's functional outcome.   REHAB POTENTIAL: Good  CLINICAL DECISION MAKING: Stable/uncomplicated  EVALUATION COMPLEXITY: Low   GOALS: Goals reviewed with patient? Yes  SHORT TERM GOALS: Target date: 01/06/2022  Pt will report understanding and adherence to initial HEP in order to promote independence in the management of primary impairments. Baseline: HEP provided at eval Goal status: INITIAL   LONG TERM GOALS: Target date: 02/03/2022   Pt will achieve an LEFS score of 35/80 or greater in order to demonstrate improved functional ability as it relates to the pt's primary impairments. Baseline: 7/80 Goal status: INITIAL  2.  Pt will report ability to stand/ walk x20 minutes without AD in order to go shopping with less limitation. Baseline: Unable to walk/ stand due to pain Goal status: INITIAL  3.  Pt will demonstrate 10 full-depth squats with 0-4/10 pain in order to pick up groceries from the floor with less limitation. Baseline: Unable to  perform squat due to pain Goal status: INITIAL  4.  Pt will demonstrate 10 double-leg heel raises with 0-4/10 pain in order to put away dishes into overhead cabinets with less limitation. Baseline: Unable to perform heel raises due to pain Goal status: INITIAL  5.  Pt will achieve global Rt ankle AROM with 0-4/10 pain in order to get dressed with less limitation. Baseline: See ROM chart (9-10/10 pain) Goal status: INITIAL   PLAN: PT FREQUENCY: 2x/week  PT DURATION: 8 weeks  PLANNED INTERVENTIONS: Therapeutic exercises, Therapeutic activity, Neuromuscular re-education, Balance training, Gait training, Patient/Family education, Self Care,  Joint mobilization, Joint manipulation, Stair training, Orthotic/Fit training, DME instructions, Aquatic Therapy, Dry Needling, Electrical stimulation, Wheelchair mobility training, Spinal manipulation, Spinal mobilization, Cryotherapy, Moist heat, Compression bandaging, Taping, Vasopneumatic device, Biofeedback, Ionotophoresis '4mg'$ /ml Dexamethasone, Manual therapy, and Re-evaluation  PLAN FOR NEXT SESSION: Progress early intrinsic foot strengthening/ global LE strengthening, graded exposure to deep pressure/ weight-bearing   Vanessa [place], PT, DPT 12/09/21 6:39 PM

## 2021-12-09 ENCOUNTER — Ambulatory Visit: Payer: Medicaid Other | Attending: Orthopaedic Surgery

## 2021-12-09 ENCOUNTER — Other Ambulatory Visit: Payer: Self-pay

## 2021-12-09 DIAGNOSIS — R2681 Unsteadiness on feet: Secondary | ICD-10-CM | POA: Insufficient documentation

## 2021-12-09 DIAGNOSIS — R600 Localized edema: Secondary | ICD-10-CM | POA: Insufficient documentation

## 2021-12-09 DIAGNOSIS — M6281 Muscle weakness (generalized): Secondary | ICD-10-CM | POA: Diagnosis present

## 2021-12-09 DIAGNOSIS — M25571 Pain in right ankle and joints of right foot: Secondary | ICD-10-CM | POA: Insufficient documentation

## 2021-12-17 ENCOUNTER — Ambulatory Visit: Payer: Medicaid Other

## 2021-12-22 ENCOUNTER — Ambulatory Visit: Payer: Medicaid Other

## 2021-12-22 NOTE — Therapy (Deleted)
OUTPATIENT PHYSICAL THERAPY LOWER EXTREMITY EVALUATION   Patient Name: Carmen Daniels MRN: [id number] DOB:1985-05-09, 36 y.o., female Today's Date: 12/22/2021     Past Medical History:  Diagnosis Date   Allergic rhinitis    Anxiety    Asthma    Bipolar disorder ([place])    Borderline personality disorder ([place])    Bupropion overdose    Chronic pain syndrome    Depression    Fibromyalgia    generalized   Hallucinations    Migraines    Nasal polyps    OCD (obsessive compulsive disorder)    Ovarian cyst    left   Pneumonia    2021   PONV (postoperative nausea and vomiting)    Schizophrenia ([place])    schizoaffective   Past Surgical History:  Procedure Laterality Date   ABDOMINAL HYSTERECTOMY     LAPAROSCOPIC TUBAL LIGATION Bilateral 06/13/2019   Procedure: LAPAROSCOPIC TUBAL LIGATION;  Surgeon: Chancy Milroy, MD;  Location: [facility];  Service: Gynecology;  Laterality: Bilateral;  FILSHIE CLIPS   NASAL SINUS SURGERY     TUBAL LIGATION     tubes in ears     VAGINAL HYSTERECTOMY Bilateral 01/15/2020   Procedure: HYSTERECTOMY VAGINAL;  Surgeon: Chancy Milroy, MD;  Location: [facility];  Service: Gynecology;  Laterality: Bilateral;   WISDOM TOOTH EXTRACTION     Patient Active Problem List   Diagnosis Date Noted   Intentional overdose ([place]) 02/07/2021   Weakness 08/19/2020   Gait abnormality 08/19/2020   Neuropathic pain 08/19/2020   Malnutrition of moderate degree 07/01/2020   Acute renal failure ([place]) 06/30/2020   Non-traumatic rhabdomyolysis    Elevated LFTs    Tardive dyskinesia 11/14/2019   Bipolar I disorder, most recent episode depressed ([place]) 09/13/2019   Social anxiety disorder 09/13/2019   Adjustment disorder with mixed anxiety and depressed mood 07/31/2019   Suicidal ideation    Post-operative state 07/19/2019   Seizures ([place]) 06/08/2019   Bipolar 1 disorder, depressed ([place]) 05/17/2019   Status epilepticus ([place]) 05/17/2019   Intentional  benzodiazepine overdose ([place]) 03/29/2019   Intractable chronic 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 status migrainosus 02/08/2019   History of elective abortion 12/28/2018   QT prolongation 07/03/2018   AMS (altered mental status) 07/03/2018   Serum total bilirubin elevated 07/03/2018   Syncope 07/02/2018   Borderline personality disorder ([place]) 04/11/2017   Bipolar I disorder, current or most recent episode manic, with psychotic features ([place]) 04/10/2017   Bipolar disorder ([place]) 04/10/2017   Abnormal MRI of head 04/21/2016   Chronic migraine without aura 03/27/2015   Mild persistent asthma 12/30/2014   Allergic rhinitis due to pollen 12/30/2014   Rhinitis medicamentosa 12/30/2014   Nasal polyposis 12/30/2014    PCP: [name], MD  REFERRING PROVIDER: Erle Crocker, MD  REFERRING DIAG: S/P RIGHT FOOT FX  THERAPY DIAG:  No diagnosis found.  Rationale for Evaluation and Treatment Rehabilitation  ONSET DATE: 12 weeks ago  SUBJECTIVE:   SUBJECTIVE STATEMENT: Pt reports primary c/o Rt foot and ankle pain following fx to all 5 Rt metatarsals about 12 weeks ago when the pt tripped over her new kitten. Imaging is currently unavailable in Epic. Pt was in a lower leg cast for 11 week. She had her cast removed last week and is now wearing a boot. She is currently WBAT with the boot. Current pain is 9/10. Worst pain is 10/10. Best pain is 9/10.  Aggravating pain is walking, standing, laying prone. Easing  factors include rest and self massage. She also endorses tingling in her feet from previous injuries which have also been treated at [facility].   PERTINENT HISTORY: Depression, anxiety, asthma, fibromyalgia, Bipolar disorder, Rt metatarsal fx x5 12 weeks ago  PAIN:  Are you having pain? Yes: NPRS scale: 9/10 Pain location: Rt foot Pain description: Sharp, tingling Aggravating factors: Standing, walking, laying prone Relieving factors: Rest, self massage  PRECAUTIONS:  Fall  WEIGHT BEARING RESTRICTIONS Yes WBAT with boot  FALLS:  Has patient fallen in last 6 months? Yes. Number of falls 1, related to current injury  LIVING ENVIRONMENT: Lives with: lives with their family Lives in: House/apartment Stairs: Yes: External: 4-5 steps; on right going up, on left going up, and can reach both Has following equipment at home: Single point cane, Walker - 2 wheeled, Crutches, and Wheelchair (manual)  OCCUPATION: Unemployed  PLOF: Independent  PATIENT GOALS Return to walking, driving, swimming, going to haunted houses, ADLs   OBJECTIVE:   DIAGNOSTIC FINDINGS: None available in EPIC  PATIENT SURVEYS:  LEFS 7/80  COGNITION:  Overall cognitive status: Within functional limits for tasks assessed     SENSATION: Not tested  EDEMA:  Figure 8: 59cm on Lt, 61cm on Rt  MUSCLE LENGTH: Gastroc: moderately tight BIL Soleus: moderately tight BIL  PALPATION: TTP to global Rt foot and ankle  LOWER EXTREMITY ROM:  A/PROM Right eval Left eval  Ankle dorsiflexion -2p!/ 0p! 3/9  Ankle plantarflexion 55p!/58p! 53/58  Ankle inversion 15p!/20p! 35/42  Ankle eversion 25p!/30p! 20/38  Great toe extension 30 35   (Blank rows = not tested)  LOWER EXTREMITY MMT:  MMT Right eval Left eval  Hip flexion 5/5 5/5  Hip extension 5/5 5/5  Hip abduction 5/5 5/5  Knee flexion 5/5 5/5  Knee extension 5/5 5/5  Ankle dorsiflexion 5/5p! 5/5  Ankle plantarflexion 5/5p! 5/5  Ankle inversion 3/5p! 4+/5  Ankle eversion 4/5p! 3/5   (Blank rows = not tested)  FUNCTIONAL TESTS:  5xSTS: Unable Squat: Unable DL heel raise: Unable  GAIT: Distance walked: Unable Assistive device utilized:  N/A Level of assistance: N/A Comments: N/A    TODAY'S TREATMENT: [facility] Adult PT Treatment:                                                DATE: 12-23-21       [facility] Adult PT Treatment:                                                DATE: 12/08/2021 Therapeutic  Exercise: Demonstration of HEP with pt education Pt education on accessing HEP via MedBridge Manual Therapy: N/A Neuromuscular re-ed: N/A Therapeutic Activity: N/A Modalities: N/A Self Care: N/A    PATIENT EDUCATION:  Education details: Pt educated on POC, protocol, prognosis, LEFS, and HEP Person educated: Patient and Parent Education method: Explanation, Demonstration, and Handouts Education comprehension: verbalized understanding and returned demonstration   HOME EXERCISE PROGRAM: Access Code: [email or web address] URL: [email or web address] Date: 12/09/2021 Prepared by: Vanessa [place]  Exercises - Seated Toe Towel Scrunches  - 1 x daily - 7 x weekly - 3 sets - 10 reps - Seated Heel Raise  - 1 x daily - 7 x weekly -  3 sets - 10 reps - Seated Toe Flexion Extension PROM  - 1 x daily - 7 x weekly - 3 sets - 20 reps - Ankle Inversion Eversion Towel Slide  - 1 x daily - 7 x weekly - 3 sets - 10 reps - Long Sitting Plantar Fascia Stretch with Towel  - 1 x daily - 7 x weekly - 2 sets - 1 minute hold  ASSESSMENT:  CLINICAL IMPRESSION: Patient is a 36 y.o. F who was seen today for physical therapy evaluation and treatment for Rt foot and ankle pain following metatarsal fx x5 twelve weeks ago.  Upon assessment, her primary impairments include limited and painful global Rt ankle AROM, painful global Rt ankle MMT with pain with ankle DF and PF, Rt foot/ ankle swelling, tight BIL gastroc/ soleus, TTP to global Rt foot/ ankle, and limited functional mobility. Pt will benefit from skilled PT to address her primary impairments and return to her prior level of function with less limitation.   OBJECTIVE IMPAIRMENTS Abnormal gait, decreased activity tolerance, decreased balance, decreased coordination, decreased endurance, decreased knowledge of use of DME, decreased mobility, difficulty walking, decreased ROM, decreased strength, hypomobility, increased edema, impaired flexibility,  impaired sensation, improper body mechanics, postural dysfunction, and pain.   ACTIVITY LIMITATIONS carrying, lifting, bending, sitting, standing, squatting, sleeping, stairs, transfers, bed mobility, bathing, toileting, dressing, hygiene/grooming, and locomotion level  PARTICIPATION LIMITATIONS: meal prep, cleaning, laundry, driving, shopping, community activity, and yard work  PERSONAL FACTORS Past/current experiences and 3+ comorbidities: See medical hx  are also affecting patient's functional outcome.   REHAB POTENTIAL: Good  CLINICAL DECISION MAKING: Stable/uncomplicated  EVALUATION COMPLEXITY: Low   GOALS: Goals reviewed with patient? Yes  SHORT TERM GOALS: Target date: 01/06/2022  Pt will report understanding and adherence to initial HEP in order to promote independence in the management of primary impairments. Baseline: HEP provided at eval Goal status: INITIAL   LONG TERM GOALS: Target date: 02/03/2022   Pt will achieve an LEFS score of 35/80 or greater in order to demonstrate improved functional ability as it relates to the pt's primary impairments. Baseline: 7/80 Goal status: INITIAL  2.  Pt will report ability to stand/ walk x20 minutes without AD in order to go shopping with less limitation. Baseline: Unable to walk/ stand due to pain Goal status: INITIAL  3.  Pt will demonstrate 10 full-depth squats with 0-4/10 pain in order to pick up groceries from the floor with less limitation. Baseline: Unable to perform squat due to pain Goal status: INITIAL  4.  Pt will demonstrate 10 double-leg heel raises with 0-4/10 pain in order to put away dishes into overhead cabinets with less limitation. Baseline: Unable to perform heel raises due to pain Goal status: INITIAL  5.  Pt will achieve global Rt ankle AROM with 0-4/10 pain in order to get dressed with less limitation. Baseline: See ROM chart (9-10/10 pain) Goal status: INITIAL   PLAN: PT FREQUENCY: 2x/week  PT  DURATION: 8 weeks  PLANNED INTERVENTIONS: Therapeutic exercises, Therapeutic activity, Neuromuscular re-education, Balance training, Gait training, Patient/Family education, Self Care, Joint mobilization, Joint manipulation, Stair training, Orthotic/Fit training, DME instructions, Aquatic Therapy, Dry Needling, Electrical stimulation, Wheelchair mobility training, Spinal manipulation, Spinal mobilization, Cryotherapy, Moist heat, Compression bandaging, Taping, Vasopneumatic device, Biofeedback, Ionotophoresis '4mg'$ /ml Dexamethasone, Manual therapy, and Re-evaluation  PLAN FOR NEXT SESSION: Progress early intrinsic foot strengthening/ global LE strengthening, graded exposure to deep pressure/ weight-bearing   ***

## 2021-12-23 ENCOUNTER — Ambulatory Visit: Payer: Medicaid Other | Admitting: Physical Therapy

## 2021-12-29 ENCOUNTER — Ambulatory Visit: Payer: Medicaid Other

## 2021-12-29 NOTE — Therapy (Incomplete)
OUTPATIENT PHYSICAL THERAPY LOWER EXTREMITY EVALUATION   Patient Name: Carmen Daniels MRN: [id number] DOB:10/08/1985, 36 y.o., female Today's Date: 12/22/2021     Past Medical History:  Diagnosis Date   Allergic rhinitis    Anxiety    Asthma    Bipolar disorder ([place])    Borderline personality disorder ([place])    Bupropion overdose    Chronic pain syndrome    Depression    Fibromyalgia    generalized   Hallucinations    Migraines    Nasal polyps    OCD (obsessive compulsive disorder)    Ovarian cyst    left   Pneumonia    2021   PONV (postoperative nausea and vomiting)    Schizophrenia ([place])    schizoaffective   Past Surgical History:  Procedure Laterality Date   ABDOMINAL HYSTERECTOMY     LAPAROSCOPIC TUBAL LIGATION Bilateral 06/13/2019   Procedure: LAPAROSCOPIC TUBAL LIGATION;  Surgeon: Chancy Milroy, MD;  Location: [facility];  Service: Gynecology;  Laterality: Bilateral;  FILSHIE CLIPS   NASAL SINUS SURGERY     TUBAL LIGATION     tubes in ears     VAGINAL HYSTERECTOMY Bilateral 01/15/2020   Procedure: HYSTERECTOMY VAGINAL;  Surgeon: Chancy Milroy, MD;  Location: [facility];  Service: Gynecology;  Laterality: Bilateral;   WISDOM TOOTH EXTRACTION     Patient Active Problem List   Diagnosis Date Noted   Intentional overdose ([place]) 02/07/2021   Weakness 08/19/2020   Gait abnormality 08/19/2020   Neuropathic pain 08/19/2020   Malnutrition of moderate degree 07/01/2020   Acute renal failure ([place]) 06/30/2020   Non-traumatic rhabdomyolysis    Elevated LFTs    Tardive dyskinesia 11/14/2019   Bipolar I disorder, most recent episode depressed ([place]) 09/13/2019   Social anxiety disorder 09/13/2019   Adjustment disorder with mixed anxiety and depressed mood 07/31/2019   Suicidal ideation    Post-operative state 07/19/2019   Seizures ([place]) 06/08/2019   Bipolar 1 disorder, depressed ([place]) 05/17/2019   Status epilepticus ([place]) 05/17/2019   Intentional  benzodiazepine overdose ([place]) 03/29/2019   Intractable chronic 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 status migrainosus 02/08/2019   History of elective abortion 12/28/2018   QT prolongation 07/03/2018   AMS (altered mental status) 07/03/2018   Serum total bilirubin elevated 07/03/2018   Syncope 07/02/2018   Borderline personality disorder ([place]) 04/11/2017   Bipolar I disorder, current or most recent episode manic, with psychotic features ([place]) 04/10/2017   Bipolar disorder ([place]) 04/10/2017   Abnormal MRI of head 04/21/2016   Chronic migraine without aura 03/27/2015   Mild persistent asthma 12/30/2014   Allergic rhinitis due to pollen 12/30/2014   Rhinitis medicamentosa 12/30/2014   Nasal polyposis 12/30/2014    PCP: [name], MD  REFERRING PROVIDER: Erle Crocker, MD  REFERRING DIAG: S/P RIGHT FOOT FX  THERAPY DIAG:  No diagnosis found.  Rationale for Evaluation and Treatment Rehabilitation  ONSET DATE: 12 weeks ago  SUBJECTIVE:   SUBJECTIVE STATEMENT: Pt reports primary c/o Rt foot and ankle pain following fx to all 5 Rt metatarsals about 12 weeks ago when the pt tripped over her new kitten. Imaging is currently unavailable in Epic. Pt was in a lower leg cast for 11 week. She had her cast removed last week and is now wearing a boot. She is currently WBAT with the boot. Current pain is 9/10. Worst pain is 10/10. Best pain is 9/10.  Aggravating pain is walking, standing, laying prone. Easing  factors include rest and self massage. She also endorses tingling in her feet from previous injuries which have also been treated at [facility].   PERTINENT HISTORY: Depression, anxiety, asthma, fibromyalgia, Bipolar disorder, Rt metatarsal fx x5 12 weeks ago  PAIN:  Are you having pain? Yes: NPRS scale: 9/10 Pain location: Rt foot Pain description: Sharp, tingling Aggravating factors: Standing, walking, laying prone Relieving factors: Rest, self massage  PRECAUTIONS:  Fall  WEIGHT BEARING RESTRICTIONS Yes WBAT with boot  FALLS:  Has patient fallen in last 6 months? Yes. Number of falls 1, related to current injury  LIVING ENVIRONMENT: Lives with: lives with their family Lives in: House/apartment Stairs: Yes: External: 4-5 steps; on right going up, on left going up, and can reach both Has following equipment at home: Single point cane, Walker - 2 wheeled, Crutches, and Wheelchair (manual)  OCCUPATION: Unemployed  PLOF: Independent  PATIENT GOALS Return to walking, driving, swimming, going to haunted houses, ADLs   OBJECTIVE:   DIAGNOSTIC FINDINGS: None available in EPIC  PATIENT SURVEYS:  LEFS 7/80  COGNITION:  Overall cognitive status: Within functional limits for tasks assessed     SENSATION: Not tested  EDEMA:  Figure 8: 59cm on Lt, 61cm on Rt  MUSCLE LENGTH: Gastroc: moderately tight BIL Soleus: moderately tight BIL  PALPATION: TTP to global Rt foot and ankle  LOWER EXTREMITY ROM:  A/PROM Right eval Left eval  Ankle dorsiflexion -2p!/ 0p! 3/9  Ankle plantarflexion 55p!/58p! 53/58  Ankle inversion 15p!/20p! 35/42  Ankle eversion 25p!/30p! 20/38  Great toe extension 30 35   (Blank rows = not tested)  LOWER EXTREMITY MMT:  MMT Right eval Left eval  Hip flexion 5/5 5/5  Hip extension 5/5 5/5  Hip abduction 5/5 5/5  Knee flexion 5/5 5/5  Knee extension 5/5 5/5  Ankle dorsiflexion 5/5p! 5/5  Ankle plantarflexion 5/5p! 5/5  Ankle inversion 3/5p! 4+/5  Ankle eversion 4/5p! 3/5   (Blank rows = not tested)  FUNCTIONAL TESTS:  5xSTS: Unable Squat: Unable DL heel raise: Unable  GAIT: Distance walked: Unable Assistive device utilized:  N/A Level of assistance: N/A Comments: N/A    TODAY'S TREATMENT: [facility] Adult PT Treatment:                                                DATE: 12-30-21  [facility] Adult PT Treatment:                                                DATE: 12-23-21   cancelled due to  sickness   [facility] Treatment:                                                DATE: 12/08/2021 Therapeutic Exercise: Demonstration of HEP with pt education Pt education on accessing HEP via MedBridge Manual Therapy: N/A Neuromuscular re-ed: N/A Therapeutic Activity: N/A Modalities: N/A Self Care: N/A    PATIENT EDUCATION:  Education details: Pt educated on POC, protocol, prognosis, LEFS, and HEP Person educated: Patient and Parent Education method: Explanation, Media planner, and Handouts Education comprehension:  verbalized understanding and returned demonstration   HOME EXERCISE PROGRAM: Access Code: [email or web address] URL: [email or web address] Date: 12/09/2021 Prepared by: Vanessa [place]  Exercises - Seated Toe Towel Scrunches  - 1 x daily - 7 x weekly - 3 sets - 10 reps - Seated Heel Raise  - 1 x daily - 7 x weekly - 3 sets - 10 reps - Seated Toe Flexion Extension PROM  - 1 x daily - 7 x weekly - 3 sets - 20 reps - Ankle Inversion Eversion Towel Slide  - 1 x daily - 7 x weekly - 3 sets - 10 reps - Long Sitting Plantar Fascia Stretch with Towel  - 1 x daily - 7 x weekly - 2 sets - 1 minute hold  ASSESSMENT:  CLINICAL IMPRESSION: Patient is a 36 y.o. F who was seen today for physical therapy evaluation and treatment for Rt foot and ankle pain following metatarsal fx x5 twelve weeks ago.  Upon assessment, her primary impairments include limited and painful global Rt ankle AROM, painful global Rt ankle MMT with pain with ankle DF and PF, Rt foot/ ankle swelling, tight BIL gastroc/ soleus, TTP to global Rt foot/ ankle, and limited functional mobility. Pt will benefit from skilled PT to address her primary impairments and return to her prior level of function with less limitation.   OBJECTIVE IMPAIRMENTS Abnormal gait, decreased activity tolerance, decreased balance, decreased coordination, decreased endurance, decreased knowledge of use of DME, decreased  mobility, difficulty walking, decreased ROM, decreased strength, hypomobility, increased edema, impaired flexibility, impaired sensation, improper body mechanics, postural dysfunction, and pain.   ACTIVITY LIMITATIONS carrying, lifting, bending, sitting, standing, squatting, sleeping, stairs, transfers, bed mobility, bathing, toileting, dressing, hygiene/grooming, and locomotion level  PARTICIPATION LIMITATIONS: meal prep, cleaning, laundry, driving, shopping, community activity, and yard work  PERSONAL FACTORS Past/current experiences and 3+ comorbidities: See medical hx  are also affecting patient's functional outcome.   REHAB POTENTIAL: Good  CLINICAL DECISION MAKING: Stable/uncomplicated  EVALUATION COMPLEXITY: Low   GOALS: Goals reviewed with patient? Yes  SHORT TERM GOALS: Target date: 01/06/2022  Pt will report understanding and adherence to initial HEP in order to promote independence in the management of primary impairments. Baseline: HEP provided at eval Goal status: INITIAL   LONG TERM GOALS: Target date: 02/03/2022   Pt will achieve an LEFS score of 35/80 or greater in order to demonstrate improved functional ability as it relates to the pt's primary impairments. Baseline: 7/80 Goal status: INITIAL  2.  Pt will report ability to stand/ walk x20 minutes without AD in order to go shopping with less limitation. Baseline: Unable to walk/ stand due to pain Goal status: INITIAL  3.  Pt will demonstrate 10 full-depth squats with 0-4/10 pain in order to pick up groceries from the floor with less limitation. Baseline: Unable to perform squat due to pain Goal status: INITIAL  4.  Pt will demonstrate 10 double-leg heel raises with 0-4/10 pain in order to put away dishes into overhead cabinets with less limitation. Baseline: Unable to perform heel raises due to pain Goal status: INITIAL  5.  Pt will achieve global Rt ankle AROM with 0-4/10 pain in order to get dressed with  less limitation. Baseline: See ROM chart (9-10/10 pain) Goal status: INITIAL   PLAN: PT FREQUENCY: 2x/week  PT DURATION: 8 weeks  PLANNED INTERVENTIONS: Therapeutic exercises, Therapeutic activity, Neuromuscular re-education, Balance training, Gait training, Patient/Family education, Self Care, Joint mobilization, Joint manipulation, Stair training, Orthotic/Fit training, DME instructions, Aquatic Therapy,  Dry Needling, Electrical stimulation, Wheelchair mobility training, Spinal manipulation, Spinal mobilization, Cryotherapy, Moist heat, Compression bandaging, Taping, Vasopneumatic device, Biofeedback, Ionotophoresis '4mg'$ /ml Dexamethasone, Manual therapy, and Re-evaluation  PLAN FOR NEXT SESSION: Progress early intrinsic foot strengthening/ global LE strengthening, graded exposure to deep pressure/ weight-bearing   ***

## 2021-12-30 ENCOUNTER — Ambulatory Visit: Payer: Medicaid Other | Admitting: Physical Therapy

## 2022-01-05 ENCOUNTER — Ambulatory Visit: Payer: Medicaid Other

## 2022-01-05 ENCOUNTER — Telehealth: Payer: Self-pay

## 2022-01-05 NOTE — Telephone Encounter (Signed)
Spoke with pt's mother regarding her recent late cancels. She reports the pt has been sick and not feeling ready for PT. She will call back to reschedule future appointments.

## 2022-01-06 ENCOUNTER — Ambulatory Visit: Payer: Medicaid Other | Admitting: Physical Therapy

## 2022-01-12 ENCOUNTER — Ambulatory Visit: Payer: Medicaid Other | Admitting: Neurology

## 2022-01-12 ENCOUNTER — Encounter: Payer: Self-pay | Admitting: Neurology

## 2022-01-12 VITALS — BP 126/89 | HR 77

## 2022-01-12 DIAGNOSIS — F319 Bipolar disorder, unspecified: Secondary | ICD-10-CM | POA: Diagnosis not present

## 2022-01-12 DIAGNOSIS — G43719 Chronic migraine without aura, intractable, without status migrainosus: Secondary | ICD-10-CM

## 2022-01-12 MED ORDER — QULIPTA 60 MG PO TABS: 60.0000 mg | ORAL_TABLET | Freq: Every day | ORAL | 11 refills | Status: DC

## 2022-01-12 MED ORDER — SUMATRIPTAN SUCCINATE 100 MG PO TABS: ORAL_TABLET | ORAL | 11 refills | Status: DC

## 2022-01-12 MED ORDER — ONABOTULINUMTOXINA 200 UNITS IJ SOLR: 200.0000 [IU] | Freq: Once | INTRAMUSCULAR | Status: DC

## 2022-01-12 NOTE — Progress Notes (Signed)
Botox injection for chronic migraine prevention, injection was performed according to [place] protocol,  5 units of Botox was injected into each side, for 31 injection sites, total of 155 units  Bilateral frontalis 4 injection sites Bilateral corrugate 2 injection sites Procerus 1 injection sites. Bilateral temporalis 8 injection sites Bilateral occipitalis 6 injection sites Bilateral cervical paraspinals 4 injection sites Bilateral upper trapezius 6 injection sites  Extra 45 unites were injected into bilateral upper cervical region and masseters    She suffered right ankle fracture after fall in boots, overall bilateral lower extremity strength recovered very well   Suboptimal control despite Botox injection, add on Qulipta 60 mg daily

## 2022-01-12 NOTE — Progress Notes (Signed)
Botox 200 units x 1 vial  [id number] Exp-04-2024 SP  

## 2022-01-13 ENCOUNTER — Ambulatory Visit: Payer: Medicaid Other | Admitting: Physical Therapy

## 2022-01-20 ENCOUNTER — Telehealth: Payer: Self-pay | Admitting: Neurology

## 2022-01-20 NOTE — Telephone Encounter (Signed)
Pt mother is calling. Asking if a PA has been sent for new medication. She is requesting a call-back from nurses

## 2022-01-22 NOTE — Telephone Encounter (Signed)
Pt mother call back. Stated they are waiting on PA on Atogepant (QULIPTA) 60 MG TABS  .

## 2022-01-25 NOTE — Telephone Encounter (Signed)
Noted, thank you

## 2022-01-25 NOTE — Telephone Encounter (Signed)
I called [facility] and spoke with Tamika and submitted PA. Ref # [id number], determination should be reached within 24 hours. ID # for call [id number].

## 2022-01-27 ENCOUNTER — Other Ambulatory Visit: Payer: Self-pay | Admitting: Orthopaedic Surgery

## 2022-01-31 NOTE — Patient Instructions (Signed)
SURGICAL WAITING ROOM VISITATION Patients having surgery or a procedure may have no more than 2 support people in the waiting area - these visitors may rotate in the visitor waiting room.   Children under the age of 28 must have an adult with them who is not the patient. If the patient needs to stay at the hospital during part of their recovery, the visitor guidelines for inpatient rooms apply.  PRE-OP VISITATION  Pre-op nurse will coordinate an appropriate time for 1 support person to accompany the patient in pre-op.  This support person may not rotate.  This visitor will be contacted when the time is appropriate for the visitor to come back in the pre-op area.  Please refer to the [facility] website for the visitor guidelines for Inpatients (after your surgery is over and you are in a regular room).  You are not required to quarantine at this time prior to your surgery. However, you must do this: Hand Hygiene often Do NOT share personal items Notify your provider if you are in close contact with someone who has COVID or you develop fever 100.4 or greater, new onset of sneezing, cough, sore throat, shortness of breath or body aches.  If you test positive for Covid or have been in contact with anyone that has tested positive in the last 10 days please notify you surgeon.    Your procedure is scheduled on:  Thursday   February 04, 2022    Report to [facility] Main Entrance: [place] entrance where the [employer] is available.   Report to admitting at: 12:15  pm  +++++Call this number if you have any questions or problems the morning of surgery [phone]  Do not eat food after Midnight the night prior to your surgery/procedure.  After Midnight you may have the following liquids until  11:30 AM DAY OF SURGERY  Clear Liquid Diet Water Black Coffee (sugar ok, NO MILK/CREAM OR CREAMERS)  Tea (sugar ok, NO MILK/CREAM OR CREAMERS) regular and decaf                              Plain Jell-O  with no fruit (NO RED)                                           Fruit ices (not with fruit pulp, NO RED)                                     Popsicles (NO RED)                                                                  Juice: apple, WHITE grape, WHITE cranberry Sports drinks like Gatorade or Powerade (NO RED)                    The day of surgery:  Drink ONE (1) Pre-Surgery Clear Ensure at  11:30 AM the morning of surgery. Drink in one sitting. Do not sip.  This drink  was given to you during your hospital pre-op appointment visit. Nothing else to drink after completing the Pre-Surgery Clear Ensure.  No candy, chewing gum or throat lozenges.    FOLLOW ANY ADDITIONAL PRE OP INSTRUCTIONS YOU RECEIVED FROM YOUR SURGEON'S OFFICE!!!   Oral Hygiene is also important to reduce your risk of infection.        Remember - BRUSH YOUR TEETH THE MORNING OF SURGERY WITH YOUR REGULAR TOOTHPASTE  Do NOT smoke after Midnight the night before surgery.  Take ONLY these medicines the morning of surgery with A SIP OF WATER: Duloxentine (Cymbalta)                    You may not have any metal on your body including hair pins, jewelry, and body piercing  Do not wear make-up, lotions, powders, perfumes or deodorant  Do not wear nail polish including gel and S&S, artificial / acrylic nails, or any other type of covering on natural nails including finger and toenails. If you have artificial nails, gel coating, etc., that needs to be removed by a nail salon, Please have this removed prior to surgery. Not doing so may mean that your surgery could be cancelled or delayed if the Surgeon or anesthesia staff feels like they are unable to monitor you safely.   Do not shave 48 hours prior to surgery to avoid nicks in your skin which may contribute to postoperative infections.   Contacts, Hearing Aids, dentures or bridgework may not be worn into surgery. DENTURES WILL BE REMOVED PRIOR TO SURGERY  PLEASE DO NOT APPLY "Poly grip" OR ADHESIVES!!!  Patients discharged on the day of surgery will not be allowed to drive home.  Someone NEEDS to stay with you for the first 24 hours after anesthesia.  Do not bring your home medications to the hospital. The Pharmacy will dispense medications listed on your medication list to you during your admission in the Hospital.  Special Instructions: Bring a copy of your healthcare power of attorney and living will documents the day of surgery, if you wish to have them scanned into your [facility]- EPIC  Please read over the following fact sheets you were given: IF YOU HAVE QUESTIONS ABOUT YOUR PRE-OP INSTRUCTIONS, PLEASE CALL [phone]  ([facility])   [place] - Preparing for Surgery Before surgery, you can play an important role.  Because skin is not sterile, your skin needs to be as free of germs as possible.  You can reduce the number of germs on your skin by washing with CHG (chlorahexidine gluconate) soap before surgery.  CHG is an antiseptic cleaner which kills germs and bonds with the skin to continue killing germs even after washing. Please DO NOT use if you have an allergy to CHG or antibacterial soaps.  If your skin becomes reddened/irritated stop using the CHG and inform your nurse when you arrive at Short Stay. Do not shave (including legs and underarms) for at least 48 hours prior to the first CHG shower.  You may shave your face/neck.  Please follow these instructions carefully:  1.  Shower with CHG Soap the night before surgery and the  morning of surgery.  2.  If you choose to wash your hair, wash your hair first as usual with your normal  shampoo.  3.  After you shampoo, rinse your hair and body thoroughly to remove the shampoo.  4.  Use CHG as you would any other liquid soap.  You can apply chg directly to the skin and wash.  Gently with a scrungie or clean washcloth.  5.  Apply the CHG Soap to  your body ONLY FROM THE NECK DOWN.   Do not use on face/ open                           Wound or open sores. Avoid contact with eyes, ears mouth and genitals (private parts).                       Wash face,  Genitals (private parts) with your normal soap.             6.  Wash thoroughly, paying special attention to the area where your  surgery  will be performed.  7.  Thoroughly rinse your body with warm water from the neck down.  8.  DO NOT shower/wash with your normal soap after using and rinsing off the CHG Soap.            9.  Pat yourself dry with a clean towel.            10.  Wear clean pajamas.            11.  Place clean sheets on your bed the night of your first shower and do not  sleep with pets.  ON THE DAY OF SURGERY : Do not apply any lotions/deodorants the morning of surgery.  Please wear clean clothes to the hospital/surgery center.    FAILURE TO FOLLOW THESE INSTRUCTIONS MAY RESULT IN THE CANCELLATION OF YOUR SURGERY  PATIENT SIGNATURE_________________________________  NURSE SIGNATURE__________________________________  ________________________________________________________________________        Carmen Daniels    An incentive spirometer is a tool that can help keep your lungs clear and active. This tool measures how well you are filling your lungs with each breath. Taking long deep breaths may help reverse or decrease the chance of developing breathing (pulmonary) problems (especially infection) following: A long period of time when you are unable to move or be active. BEFORE THE PROCEDURE  If the spirometer includes an indicator to show your best effort, your nurse or respiratory therapist will set it to a desired goal. If possible, sit up straight or lean slightly forward. Try not to slouch. Hold the incentive spirometer in an upright position. INSTRUCTIONS FOR USE  Sit on the edge of your bed if possible, or sit up as far as you can in bed or on a  chair. Hold the incentive spirometer in an upright position. Breathe out normally. Place the mouthpiece in your mouth and seal your lips tightly around it. Breathe in slowly and as deeply as possible, raising the piston or the ball toward the top of the column. Hold your breath for 3-5 seconds or for as long as possible. Allow the piston or ball to fall to the bottom of the column. Remove the mouthpiece from your mouth and breathe out normally. Rest for a few seconds and repeat Steps 1 through 7 at least 10 times every 1-2 hours when you are awake. Take your time and take a few normal breaths between deep breaths. The spirometer may include an indicator to show your best effort. Use the indicator as a goal to work toward during each repetition. After each set of 10 deep breaths, practice coughing to  be sure your lungs are clear. If you have an incision (the cut made at the time of surgery), support your incision when coughing by placing a pillow or rolled up towels firmly against it. Once you are able to get out of bed, walk around indoors and cough well. You may stop using the incentive spirometer when instructed by your caregiver.  RISKS AND COMPLICATIONS Take your time so you do not get dizzy or light-headed. If you are in pain, you may need to take or ask for pain medication before doing incentive spirometry. It is harder to take a deep breath if you are having pain. AFTER USE Rest and breathe slowly and easily. It can be helpful to keep track of a log of your progress. Your caregiver can provide you with a simple table to help with this. If you are using the spirometer at home, follow these instructions: [place] IF:  You are having difficultly using the spirometer. You have trouble using the spirometer as often as instructed. Your pain medication is not giving enough relief while using the spirometer. You develop fever of 100.5 F (38.1 C) or higher.                                                                                                     SEEK IMMEDIATE MEDICAL CARE IF:  You cough up bloody sputum that had not been present before. You develop fever of 102 F (38.9 C) or greater. You develop worsening pain at or near the incision site. MAKE SURE YOU:  Understand these instructions. Will watch your condition. Will get help right away if you are not doing well or get worse. Document Released: 06/21/2006 Document Revised: 05/03/2011 Document Reviewed: 08/22/2006 [facility] Patient Information 2014 [place], [place].

## 2022-01-31 NOTE — Progress Notes (Signed)
COVID Vaccine received:  _0  No _1  Yes Date of any COVID positive Test in last 90 days:  PCP -  [name], MD Cardiologist - none Pain Medicine-   Chest x-ray - 02-09-2021 EKG - 02-15-2021  Stress Test -  ECHO -  Cardiac Cath -   PCR screen: _2  Ordered & Completed      Hx of MRSA                      _3   No Order but Needs PROFEND                      _4   N/A for this surgery  Surgery Plan:  _5  Ambulatory                            _6  Outpatient in bed                            _7  Admit  Anesthesia:    _8  General  _9  Spinal                           _10   Choice _11   MAC  Pacemaker / ICD device _12  No _13  Yes        Device order form faxed _14  No    _15   Yes      Faxed to:  Spinal Cord Stimulator:_16  No _17  Yes      (Remind patient to bring remote DOS) Other Implants:   History of Sleep Apnea? _18  No _19  Yes   CPAP used?- _20  No _21  Yes    Does the patient monitor blood sugar? _22  No _23  Yes  _24  N/A  Blood Thinner / Instructions: none Aspirin Instructions:none  ERAS Protocol Ordered: _25  No  _26  Yes PRE-SURGERY _27  ENSURE  _28  G2  Patient is to be NPO after: 1130  Comments: Bipolar I, SI (multiple suicide attempts)  Activity level: Patient can / can not climb a flight of stairs without difficulty; _29  No CP  _30  No SOB, but would have ______   Patient can / can not perform ADLs without assistance.   Anesthesia review: Difficult Airway?, Substance abuse, Seizures-      smoker chronic migraines.   Patient denies shortness of breath, fever, cough and chest pain at PAT appointment.  Patient verbalized understanding and agreement to the Pre-Surgical Instructions that were given to them at this PAT appointment. Patient was also educated of the need to review these PAT instructions again prior to his/her surgery.I reviewed the appropriate phone numbers to call if they have any and questions or concerns.

## 2022-02-03 ENCOUNTER — Encounter (HOSPITAL_COMMUNITY)
Admission: RE | Admit: 2022-02-03 | Discharge: 2022-02-03 | Disposition: A | Discharge status: Home / Self Care | Payer: Medicaid Other | Source: Ambulatory Visit | Attending: Internal Medicine | Admitting: Internal Medicine

## 2022-02-03 DIAGNOSIS — Z8614 Personal history of Methicillin resistant Staphylococcus aureus infection: Secondary | ICD-10-CM

## 2022-02-03 DIAGNOSIS — F1910 Other psychoactive substance abuse, uncomplicated: Secondary | ICD-10-CM

## 2022-02-08 ENCOUNTER — Other Ambulatory Visit: Payer: Self-pay

## 2022-02-08 ENCOUNTER — Encounter (HOSPITAL_COMMUNITY): Payer: Self-pay | Admitting: Orthopaedic Surgery

## 2022-02-08 NOTE — Progress Notes (Addendum)
I left a voice on Carmen Daniels phone, I asked her to call me back , Carmen Daniels called back.  Carmen Daniels denies chest pain or shortness of breath. Patient denies having any s/s of Covid in her household, also denies any known exposure to Covid. Dr. Marcial Pacas is Carmen Daniels's  PCP.

## 2022-02-08 NOTE — Progress Notes (Deleted)
I called [name]' cell phone, her mother Carmen Daniels, "Carmen Daniels" is sleeping, [name] is a designed person to speak with.  [name] reports that Carmen Daniels has not complained of chest pain or shortness of breath.  [name] states that Carmen Daniels denies having any s/s of Covid in her household, also denies any known exposure to Covid.   Carmen Daniels's PCP is Dr. [name].  Carmen Daniels  joined her mother on speaker phone. Carmen Daniels asked if patient is to hold Coumadin, I told her the the surgeon's office gives instructions.Carmen Daniels asked, are you going to call the office and tell them to call her. I called and left a message on nurse's voice mail, I asked staff to call Carmen Daniels's mother, Carmen Daniels and tell her. When I came to notes to chart information, I saw  a note from Dr. Claretha Cooper office that states that Carmen Daniels should continue blood thinners and that it was discussed with patient and family. Carmen Daniels called me back, I inform her of what the note from [facility] office said.

## 2022-02-09 ENCOUNTER — Other Ambulatory Visit: Payer: Self-pay

## 2022-02-09 ENCOUNTER — Encounter (HOSPITAL_COMMUNITY)
Admission: RE | Disposition: A | Discharge status: Home / Self Care | Payer: Self-pay | Source: Home / Self Care | Attending: Orthopaedic Surgery

## 2022-02-09 ENCOUNTER — Ambulatory Visit (HOSPITAL_COMMUNITY): Payer: Medicaid Other

## 2022-02-09 ENCOUNTER — Encounter (HOSPITAL_COMMUNITY): Payer: Self-pay | Admitting: Orthopaedic Surgery

## 2022-02-09 ENCOUNTER — Ambulatory Visit (HOSPITAL_BASED_OUTPATIENT_CLINIC_OR_DEPARTMENT_OTHER): Payer: Medicaid Other | Admitting: Certified Registered Nurse Anesthetist

## 2022-02-09 ENCOUNTER — Ambulatory Visit (HOSPITAL_COMMUNITY)
Admission: RE | Admit: 2022-02-09 | Discharge: 2022-02-09 | Disposition: A | Discharge status: Home / Self Care | Payer: Medicaid Other | Attending: Orthopaedic Surgery | Admitting: Orthopaedic Surgery

## 2022-02-09 ENCOUNTER — Ambulatory Visit (HOSPITAL_COMMUNITY): Payer: Medicaid Other | Admitting: Physician Assistant

## 2022-02-09 DIAGNOSIS — F319 Bipolar disorder, unspecified: Secondary | ICD-10-CM | POA: Insufficient documentation

## 2022-02-09 DIAGNOSIS — M1990 Unspecified osteoarthritis, unspecified site: Secondary | ICD-10-CM | POA: Insufficient documentation

## 2022-02-09 DIAGNOSIS — Z8614 Personal history of Methicillin resistant Staphylococcus aureus infection: Secondary | ICD-10-CM

## 2022-02-09 DIAGNOSIS — F419 Anxiety disorder, unspecified: Secondary | ICD-10-CM | POA: Insufficient documentation

## 2022-02-09 DIAGNOSIS — S82841A Displaced bimalleolar fracture of right lower leg, initial encounter for closed fracture: Secondary | ICD-10-CM

## 2022-02-09 DIAGNOSIS — W010XXA Fall on same level from slipping, tripping and stumbling without subsequent striking against object, initial encounter: Secondary | ICD-10-CM | POA: Diagnosis not present

## 2022-02-09 DIAGNOSIS — R519 Headache, unspecified: Secondary | ICD-10-CM | POA: Diagnosis not present

## 2022-02-09 DIAGNOSIS — F17210 Nicotine dependence, cigarettes, uncomplicated: Secondary | ICD-10-CM | POA: Insufficient documentation

## 2022-02-09 DIAGNOSIS — R569 Unspecified convulsions: Secondary | ICD-10-CM | POA: Diagnosis not present

## 2022-02-09 DIAGNOSIS — M797 Fibromyalgia: Secondary | ICD-10-CM | POA: Diagnosis not present

## 2022-02-09 HISTORY — DX: Unspecified osteoarthritis, unspecified site: M19.90

## 2022-02-09 HISTORY — DX: Family history of other specified conditions: Z84.89

## 2022-02-09 HISTORY — DX: Unspecified convulsions: R56.9

## 2022-02-09 HISTORY — PX: ORIF ANKLE FRACTURE: SHX5408

## 2022-02-09 LAB — BASIC METABOLIC PANEL
Anion gap: 10 (ref 5–15)
BUN: 7 mg/dL (ref 6–20)
CO2: 23 mmol/L (ref 22–32)
Calcium: 9.1 mg/dL (ref 8.9–10.3)
Chloride: 102 mmol/L (ref 98–111)
Creatinine, Ser: 0.79 mg/dL (ref 0.44–1.00)
GFR, Estimated: >60 mL/min (ref >60)
Glucose, Bld: 97 mg/dL (ref 70–99)
Potassium: 3.6 mmol/L (ref 3.5–5.1)
Sodium: 135 mmol/L (ref 135–145)

## 2022-02-09 LAB — CBC
HCT: 44.1 % (ref 36.0–46.0)
Hemoglobin: 14.9 g/dL (ref 12.0–15.0)
MCH: 28 pg (ref 26.0–34.0)
MCHC: 33.8 g/dL (ref 30.0–36.0)
MCV: 82.9 fL (ref 80.0–100.0)
Platelets: 278 10*3/uL (ref 150–400)
RBC: 5.32 MIL/uL — ABNORMAL HIGH (ref 3.87–5.11)
RDW: 13.7 % (ref 11.5–15.5)
WBC: 5.2 10*3/uL (ref 4.0–10.5)
nRBC: 0 % (ref 0.0–0.2)

## 2022-02-09 LAB — SURGICAL PCR SCREEN
MRSA, PCR: POSITIVE — AB
Staphylococcus aureus: POSITIVE — AB

## 2022-02-09 SURGERY — OPEN REDUCTION INTERNAL FIXATION (ORIF) ANKLE FRACTURE
Anesthesia: General | Site: Ankle | Laterality: Right

## 2022-02-09 MED ORDER — EPHEDRINE SULFATE-NACL 50-0.9 MG/10ML-% IV SOSY
PREFILLED_SYRINGE | INTRAVENOUS | Status: DC | PRN
  Administered 2022-02-09 (×2): 5 mg via INTRAVENOUS

## 2022-02-09 MED ORDER — VANCOMYCIN HCL IN DEXTROSE 1-5 GM/200ML-% IV SOLN
1000.0000 mg | INTRAVENOUS | Status: AC
  Administered 2022-02-09: 1000 mg via INTRAVENOUS
  Filled 2022-02-09: qty 200

## 2022-02-09 MED ORDER — MIDAZOLAM HCL 2 MG/2ML IJ SOLN
INTRAMUSCULAR | Status: AC
  Filled 2022-02-09: qty 2

## 2022-02-09 MED ORDER — 0.9 % SODIUM CHLORIDE (POUR BTL) OPTIME
TOPICAL | Status: DC | PRN
  Administered 2022-02-09: 1000 mL

## 2022-02-09 MED ORDER — BUPIVACAINE HCL (PF) 0.5 % IJ SOLN
INTRAMUSCULAR | Status: DC | PRN
  Administered 2022-02-09 (×2): 20 mL via PERINEURAL

## 2022-02-09 MED ORDER — PROPOFOL 10 MG/ML IV BOLUS
INTRAVENOUS | Status: AC
  Filled 2022-02-09: qty 20

## 2022-02-09 MED ORDER — FENTANYL CITRATE (PF) 100 MCG/2ML IJ SOLN
INTRAMUSCULAR | Status: AC
  Administered 2022-02-09: 100 ug via INTRAVENOUS
  Filled 2022-02-09: qty 2

## 2022-02-09 MED ORDER — MIDAZOLAM HCL 2 MG/2ML IJ SOLN
INTRAMUSCULAR | Status: AC
  Administered 2022-02-09: 2 mg via INTRAVENOUS
  Filled 2022-02-09: qty 2

## 2022-02-09 MED ORDER — HYDROMORPHONE HCL 1 MG/ML IJ SOLN: 0.2500 mg | INTRAMUSCULAR | Status: DC | PRN

## 2022-02-09 MED ORDER — CHLORHEXIDINE GLUCONATE 0.12 % MT SOLN
15.0000 mL | Freq: Once | OROMUCOSAL | Status: AC
  Administered 2022-02-09: 15 mL via OROMUCOSAL
  Filled 2022-02-09: qty 15

## 2022-02-09 MED ORDER — BUPIVACAINE LIPOSOME 1.3 % IJ SUSP
INTRAMUSCULAR | Status: DC | PRN
  Administered 2022-02-09: 10 mL via PERINEURAL

## 2022-02-09 MED ORDER — FENTANYL CITRATE (PF) 100 MCG/2ML IJ SOLN: 100.0000 ug | Freq: Once | INTRAMUSCULAR | Status: AC

## 2022-02-09 MED ORDER — MIDAZOLAM HCL 2 MG/2ML IJ SOLN: 2.0000 mg | Freq: Once | INTRAMUSCULAR | Status: AC

## 2022-02-09 MED ORDER — LACTATED RINGERS IV SOLN: INTRAVENOUS | Status: DC

## 2022-02-09 MED ORDER — ORAL CARE MOUTH RINSE: 15.0000 mL | Freq: Once | OROMUCOSAL | Status: AC

## 2022-02-09 MED ORDER — LIDOCAINE 2% (20 MG/ML) 5 ML SYRINGE
INTRAMUSCULAR | Status: DC | PRN
  Administered 2022-02-09: 100 mg via INTRAVENOUS

## 2022-02-09 MED ORDER — OXYCODONE HCL 5 MG PO TABS: 5.0000 mg | ORAL_TABLET | ORAL | 0 refills | Status: DC | PRN

## 2022-02-09 MED ORDER — DEXAMETHASONE SODIUM PHOSPHATE 10 MG/ML IJ SOLN
INTRAMUSCULAR | Status: DC | PRN
  Administered 2022-02-09: 10 mg via INTRAVENOUS

## 2022-02-09 MED ORDER — OXYCODONE HCL 5 MG/5ML PO SOLN: 5.0000 mg | Freq: Once | ORAL | Status: DC | PRN

## 2022-02-09 MED ORDER — MIDAZOLAM HCL 2 MG/2ML IJ SOLN
INTRAMUSCULAR | Status: DC | PRN
  Administered 2022-02-09: 2 mg via INTRAVENOUS

## 2022-02-09 MED ORDER — FENTANYL CITRATE (PF) 250 MCG/5ML IJ SOLN
INTRAMUSCULAR | Status: AC
  Filled 2022-02-09: qty 5

## 2022-02-09 MED ORDER — PROPOFOL 10 MG/ML IV BOLUS
INTRAVENOUS | Status: DC | PRN
  Administered 2022-02-09: 200 mg via INTRAVENOUS

## 2022-02-09 MED ORDER — ONDANSETRON HCL 4 MG/2ML IJ SOLN
INTRAMUSCULAR | Status: DC | PRN
  Administered 2022-02-09: 4 mg via INTRAVENOUS

## 2022-02-09 MED ORDER — OXYCODONE HCL 5 MG PO TABS: 5.0000 mg | ORAL_TABLET | Freq: Once | ORAL | Status: DC | PRN

## 2022-02-09 SURGICAL SUPPLY — 62 items
APL PRP STRL LF DISP 70% ISPRP (MISCELLANEOUS) ×4
BAG COUNTER SPONGE SURGICOUNT (BAG) ×3 IMPLANT
BAG SPNG CNTER NS LX DISP (BAG) ×2
BANDAGE ESMARK 6X9 LF (GAUZE/BANDAGES/DRESSINGS) IMPLANT
BIT DRILL 2 CANN GRADUATED (BIT) ×1 IMPLANT
BIT DRILL 2.5 CANN STRL (BIT) ×1 IMPLANT
BIT DRILL 3.5 CANN STRL (BIT) ×1 IMPLANT
BLADE SURG 15 STRL LF DISP TIS (BLADE) ×3 IMPLANT
BLADE SURG 15 STRL SS (BLADE) ×2
BNDG CMPR 9X6 STRL LF SNTH (GAUZE/BANDAGES/DRESSINGS)
BNDG CMPR MED 10X6 ELC LF (GAUZE/BANDAGES/DRESSINGS) ×2
BNDG ELASTIC 6X10 VLCR STRL LF (GAUZE/BANDAGES/DRESSINGS) ×3 IMPLANT
BNDG ESMARK 6X9 LF (GAUZE/BANDAGES/DRESSINGS)
BNDG GAUZE DERMACEA FLUFF 4 (GAUZE/BANDAGES/DRESSINGS) ×1 IMPLANT
BNDG GZE DERMACEA 4 6PLY (GAUZE/BANDAGES/DRESSINGS) ×2
CANISTER SUCT 3000ML PPV (MISCELLANEOUS) ×3 IMPLANT
CHLORAPREP W/TINT 26 (MISCELLANEOUS) ×6 IMPLANT
COVER SURGICAL LIGHT HANDLE (MISCELLANEOUS) ×3 IMPLANT
CUFF TOURN SGL QUICK 34 (TOURNIQUET CUFF) ×2
CUFF TOURN SGL QUICK 42 (TOURNIQUET CUFF) IMPLANT
CUFF TRNQT CYL 34X4.125X (TOURNIQUET CUFF) ×3 IMPLANT
DRAPE C-ARM 42X120 X-RAY (DRAPES) ×1 IMPLANT
DRAPE C-ARMOR (DRAPES) ×1 IMPLANT
DRSG MEPITEL 4X7.2 (GAUZE/BANDAGES/DRESSINGS) ×2 IMPLANT
DRSG XEROFORM 1X8 (GAUZE/BANDAGES/DRESSINGS) ×3 IMPLANT
ELECT REM PT RETURN 9FT ADLT (ELECTROSURGICAL) ×2
ELECTRODE REM PT RTRN 9FT ADLT (ELECTROSURGICAL) ×3 IMPLANT
GAUZE PAD ABD 8X10 STRL (GAUZE/BANDAGES/DRESSINGS) ×4 IMPLANT
GAUZE SPONGE 4X4 12PLY STRL (GAUZE/BANDAGES/DRESSINGS) ×1 IMPLANT
GAUZE SPONGE 4X4 12PLY STRL LF (GAUZE/BANDAGES/DRESSINGS) ×3 IMPLANT
GLOVE BIOGEL M STRL SZ7.5 (GLOVE) ×3 IMPLANT
GLOVE BIOGEL PI IND STRL 8 (GLOVE) ×3 IMPLANT
GOWN STRL REUS W/ TWL LRG LVL3 (GOWN DISPOSABLE) ×3 IMPLANT
GOWN STRL REUS W/ TWL XL LVL3 (GOWN DISPOSABLE) ×6 IMPLANT
GOWN STRL REUS W/TWL LRG LVL3 (GOWN DISPOSABLE) ×2
GOWN STRL REUS W/TWL XL LVL3 (GOWN DISPOSABLE) ×4
KIT BASIN OR (CUSTOM PROCEDURE TRAY) ×3 IMPLANT
KIT TURNOVER KIT B (KITS) ×3 IMPLANT
NS IRRIG 1000ML POUR BTL (IV SOLUTION) ×3 IMPLANT
PACK ORTHO EXTREMITY (CUSTOM PROCEDURE TRAY) ×3 IMPLANT
PAD ARMBOARD 7.5X6 YLW CONV (MISCELLANEOUS) ×6 IMPLANT
PAD CAST 4YDX4 CTTN HI CHSV (CAST SUPPLIES) ×2 IMPLANT
PADDING CAST COTTON 4X4 STRL (CAST SUPPLIES)
PLATE LOCK DIST FIB 4H RT (Plate) ×2 IMPLANT
PLATE LOCK DIST FIB 4H RT 8 (Plate) ×1 IMPLANT
SCREW CANC TI FT ANKLE 4X18 (Screw) ×1 IMPLANT
SCREW CORT 3.5X18 THRD (Screw) ×1 IMPLANT
SCREW LO-PRO TI 3.5X16MM (Screw) ×1 IMPLANT
SCREW LOCK COMP 3X16 (Screw) ×1 IMPLANT
SCREW LP 3.5X12MM (Screw) ×2 IMPLANT
SCREW LP TI 3.5X14MM (Screw) ×1 IMPLANT
SCREW VAL KREULOCK 3X20 (Screw) ×2 IMPLANT
SPONGE T-LAP 18X18 ~~LOC~~+RFID (SPONGE) ×2 IMPLANT
STAPLER VISISTAT 35W (STAPLE) ×1 IMPLANT
SUCTION FRAZIER HANDLE 10FR (MISCELLANEOUS) ×2
SUCTION TUBE FRAZIER 10FR DISP (MISCELLANEOUS) ×3 IMPLANT
SUT MNCRL AB 3-0 PS2 27 (SUTURE) ×3 IMPLANT
SUT VIC AB 2-0 CT1 27 (SUTURE) ×2
SUT VIC AB 2-0 CT1 TAPERPNT 27 (SUTURE) ×5 IMPLANT
TOWEL GREEN STERILE (TOWEL DISPOSABLE) ×3 IMPLANT
TOWEL GREEN STERILE FF (TOWEL DISPOSABLE) ×3 IMPLANT
TUBE CONNECTING 12X1/4 (SUCTIONS) ×3 IMPLANT

## 2022-02-09 NOTE — Anesthesia Procedure Notes (Addendum)
Anesthesia Regional Block: Popliteal block   Pre-Anesthetic Checklist: , timeout performed,  Correct Patient, Correct Site, Correct Laterality,  Correct Procedure, Correct Position, site marked,  Risks and benefits discussed,  Surgical consent,  Pre-op evaluation,  At surgeon's request and post-op pain management  Laterality: Right  Prep: chloraprep       Needles:  Injection technique: Single-shot  Needle Type: Echogenic Stimulator Needle     Needle Length: 10cm  Needle Gauge: 21   Needle insertion depth: 7 cm   Additional Needles:   Procedures:,,,, ultrasound used (permanent image in chart),,    Narrative:  Start time: 02/09/2022 9:38 AM End time: 02/09/2022 9:43 AM Injection made incrementally with aspirations every 5 mL.  Performed by: Personally  Anesthesiologist: Josephine Igo, MD  Additional Notes: Timeout performed. Patient sedated. Relevant anatomy ID'd using [place]. Incremental 2-84m injection of LA with frequent aspiration. Patient tolerated procedure well.

## 2022-02-09 NOTE — Transfer of Care (Signed)
Immediate Anesthesia Transfer of Care Note  Patient: Carmen Daniels  Procedure(s) Performed: OPEN TREATMENT OF RIGHT BIMALLEOLAR ANKLE FRACTURE (Right: Ankle)  Patient Location: PACU  Anesthesia Type:GA combined with regional for post-op pain  Level of Consciousness: awake, alert , and oriented  Airway & Oxygen Therapy: Patient Spontanous Breathing and Patient connected to nasal cannula oxygen  Post-op Assessment: Report given to RN and Post -op Vital signs reviewed and stable  Post vital signs: Reviewed and stable  Last Vitals:  Vitals Value Taken Time  BP 126/103 02/09/22 1212  Temp    Pulse 83 02/09/22 1215  Resp 17 02/09/22 1215  SpO2 97 % 02/09/22 1215  Vitals shown include unvalidated device data.  Last Pain:  Vitals:   02/09/22 1000  TempSrc:   PainSc: 0-No pain         Complications: No notable events documented.

## 2022-02-09 NOTE — Anesthesia Procedure Notes (Addendum)
Anesthesia Regional Block: Adductor canal block   Pre-Anesthetic Checklist: , timeout performed,  Correct Patient, Correct Site, Correct Laterality,  Correct Procedure, Correct Position, site marked,  Risks and benefits discussed,  Surgical consent,  Pre-op evaluation,  At surgeon's request and post-op pain management  Laterality: Right  Prep: chloraprep       Needles:  Injection technique: Single-shot  Needle Type: Echogenic Stimulator Needle     Needle Length: 10cm  Needle Gauge: 21   Needle insertion depth: 7 cm   Additional Needles:   Procedures:,,,, ultrasound used (permanent image in chart),,    Narrative:  Start time: 02/09/2022 9:43 AM End time: 02/09/2022 9:48 AM Injection made incrementally with aspirations every 5 mL.  Performed by: Personally  Anesthesiologist: Josephine Igo, MD  Additional Notes: Timeout performed. Patient sedated. Relevant anatomy ID'd using [place]. Incremental 2-31m injection of LA with frequent aspiration. Patient tolerated procedure well.

## 2022-02-09 NOTE — Anesthesia Postprocedure Evaluation (Signed)
Anesthesia Post Note  Patient: Carmen Daniels  Procedure(s) Performed: OPEN TREATMENT OF RIGHT BIMALLEOLAR ANKLE FRACTURE (Right: Ankle)     Patient location during evaluation: PACU Anesthesia Type: General Level of consciousness: awake and alert and oriented Pain management: pain level controlled Vital Signs Assessment: post-procedure vital signs reviewed and stable Respiratory status: spontaneous breathing, nonlabored ventilation and respiratory function stable Cardiovascular status: blood pressure returned to baseline and stable Postop Assessment: no apparent nausea or vomiting Anesthetic complications: no   No notable events documented.  Last Vitals:  Vitals:   02/09/22 1245 02/09/22 1300  BP: 130/70 124/67  Pulse: 76 71  Resp: 17 11  Temp:    SpO2: 96% 95%    Last Pain:  Vitals:   02/09/22 1300  TempSrc:   PainSc: 0-No pain                 Shaela Boer A.

## 2022-02-09 NOTE — Discharge Instructions (Signed)
DR[facility] POST-OP INSTRUCTIONS   Pain Management The numbing medicine and your leg will last around 18 hours, take a dose of your pain medicine as soon as you feel it wearing off to avoid rebound pain. Keep your foot elevated above heart level.  Make sure that your heel hangs free ('floats'). Take all prescribed medication as directed. If taking narcotic pain medication you may want to use an over-the-counter stool softener to avoid constipation. You may take over-the-counter NSAIDs (ibuprofen, naproxen, etc.) as well as over-the-counter acetaminophen as directed on the packaging as a supplement for your pain and may also use it to wean away from the prescription medication.  Activity Non-weightbearing Keep splint intact  First Postoperative Visit Your first postop visit will be at least 2 weeks after surgery.  This should be scheduled when you schedule surgery. If you do not have a postoperative visit scheduled please call [phone] to schedule an appointment. At the appointment your incision will be evaluated for suture removal, x-rays will be obtained if necessary.  General Instructions Swelling is very common after foot and ankle surgery.  It often takes 3 months for the foot and ankle to begin to feel comfortable.  Some amount of swelling will persist for 6-12 months. DO NOT change the dressing.  If there is a problem with the dressing (too tight, loose, gets wet, etc.) please contact Dr. Adair's office. DO NOT get the dressing wet.  For showers you can use an over-the-counter cast cover or wrap a washcloth around the top of your dressing and then cover it with a plastic bag and tape it to your leg. DO NOT soak the incision (no tubs, pools, bath, etc.) until you have approval from Dr. Adair.  Contact Dr. Adairs office or go to Emergency Room if: Temperature above 101 F. Increasing pain that is unresponsive to pain medication or elevation Excessive redness or  swelling in your foot Dressing problems - excessive bloody drainage, looseness or tightness, or if dressing gets wet Develop pain, swelling, warmth, or discoloration of your calf  

## 2022-02-09 NOTE — H&P (Signed)
PREOPERATIVE H&P  Chief Complaint: Right ankle pain  HPI: Carmen Daniels is a 36 y.o. female who presents for preoperative history and physical with a diagnosis of right bimalleolar ankle fracture.  She sustained this approximately week and a half ago after she tripped over her cat.  She had pain and swelling and was seen where ankle fracture was diagnosed.  There was displacement and she was indicated for surgery.  She is here today for surgery.. Symptoms are rated as moderate to severe, and have been worsening.  This is significantly impairing activities of daily living.  She has elected for surgical management.   Past Medical History:  Diagnosis Date   Allergic rhinitis    Anxiety    Arthritis    Asthma    Bipolar disorder ([place])    Borderline personality disorder ([place])    Bupropion overdose    Chronic pain syndrome    Depression    Family history of adverse reaction to anesthesia    Mom - N/V   Fibromyalgia    generalized   Hallucinations    02/08/22- have cleared mom said.   Migraines    Nasal polyps    OCD (obsessive compulsive disorder)    Ovarian cyst    left   Pneumonia    2021   PONV (postoperative nausea and vomiting)    Schizophrenia (HCC)    schizoaffective   Seizures (HCC)    patient attenped sudide by tak alot of her medication,.   Past Surgical History:  Procedure Laterality Date   ABDOMINAL HYSTERECTOMY     LAPAROSCOPIC TUBAL LIGATION Bilateral 06/13/2019   Procedure: LAPAROSCOPIC TUBAL LIGATION;  Surgeon: Chancy Milroy, MD;  Location: [facility];  Service: Gynecology;  Laterality: Bilateral;  FILSHIE CLIPS   NASAL SINUS SURGERY     TUBAL LIGATION     tubes in ears     VAGINAL HYSTERECTOMY Bilateral 01/15/2020   Procedure: HYSTERECTOMY VAGINAL;  Surgeon: Chancy Milroy, MD;  Location: [facility];  Service: Gynecology;  Laterality: Bilateral;   WISDOM TOOTH EXTRACTION     Social History   Socioeconomic History   Marital status:  Married    Spouse name: Not on file   Number of children: 0   Years of education: 14   Highest education level: Not on file  Occupational History   Occupation: Unemployed  Tobacco Use   Smoking status: Every Day    Packs/day: 0.50    Years: 15.00    Total pack years: 7.50    Types: Cigarettes   Smokeless tobacco: Never  Vaping Use   Vaping Use: Every day   Substances: Nicotine  Substance and Sexual Activity   Alcohol use: Never   Drug use: Yes    Comment: prescribed narcotics   Sexual activity: Not on file  Other Topics Concern   Not on file  Social History Narrative   Lives alone   Right-handed.   3 glasses of green tea per day.   Social Determinants of Health   Financial Resource Strain: Not on file  Food Insecurity: No Food Insecurity (10/29/2020)   Hunger Vital Sign    Worried About Running Out of Food in the Last Year: Never true    Ran Out of Food in the Last Year: Never true  Transportation Needs: Not on file  Physical Activity: Not on file  Stress: Not on file  Social Connections: Not on file   Family History  Problem Relation Age of  Onset   Healthy Mother    Healthy Father    Arthritis Maternal Grandmother    Arthritis Maternal Grandfather    Depression Paternal Grandmother    Hyperlipidemia Other    Stroke Other    Hypertension Other    Cancer Other    COPD Other    Asthma Other    Allergies  Allergen Reactions   Keflex [Cephalexin] Anaphylaxis    Has tolerated amoxicillin 2019/2020   Prior to Admission medications   Medication Sig Start Date End Date Taking? Authorizing Provider  beclomethasone (QVAR) 40 MCG/ACT inhaler Inhale 1 puff into the lungs 2 (two) times daily as needed (asthma).    Yes [provider]  botulinum toxin Type A (BOTOX) 100 units SOLR injection Inject 200 Units into the muscle every 3 (three) months. Inject into head and neck muscles 04/30/19  Yes Marcial Pacas, MD  buprenorphine (SUBUTEX) 2 MG SUBL SL tablet Place 4  mg under the tongue in the morning, at noon, in the evening, and at bedtime.   Yes [provider]  clonazePAM (KLONOPIN) 1 MG tablet Take 1 mg by mouth 4 (four) times daily. 07/14/19  Yes [provider]  DULoxetine (CYMBALTA) 60 MG capsule Take 2 capsules (120 mg total) by mouth daily. 11/30/21  Yes Eulis Canner E, NP  gabapentin (NEURONTIN) 400 MG capsule Take 400 mg by mouth 4 (four) times daily.   Yes [provider]  SUMAtriptan (IMITREX) 100 MG tablet May repeat in 2 hours if headache persists or recurs. Do no refill in less than 30 days 01/12/22  Yes Marcial Pacas, MD  traZODone (DESYREL) 100 MG tablet Take 1 tablet (100 mg total) by mouth at bedtime as needed for sleep. 11/30/21  Yes Eulis Canner E, NP  Atogepant (QULIPTA) 60 MG TABS Take 60 mg by mouth daily. Patient taking differently: Take 60 mg by mouth daily. Does not have, waiting for [employer] to approved 01/12/22   Marcial Pacas, MD  DULoxetine 40 MG CPEP Take 80 mg by mouth daily. 08/26/21   Salley Slaughter, NP  lumateperone tosylate (CAPLYTA) 42 MG capsule Take 1 capsule (42 mg total) by mouth daily. 11/30/21   Salley Slaughter, NP  Multiple Vitamins-Calcium (ONE-A-DAY WOMENS FORMULA PO) Take 1 tablet by mouth daily.    [provider]     Positive ROS: All other systems have been reviewed and were otherwise negative with the exception of those mentioned in the HPI and as above.  Physical Exam:  Vitals:   02/09/22 0831  BP: 123/79  Pulse: 83  Resp: 18  Temp: 98.4 F (36.9 C)  SpO2: 100%   General: Alert, no acute distress Cardiovascular: No pedal edema Respiratory: No cyanosis, no use of accessory musculature GI: No organomegaly, abdomen is soft and non-tender Skin: No lesions in the area of chief complaint Neurologic: Sensation intact distally Psychiatric: Patient is competent for consent with normal mood and affect Lymphatic: No axillary or cervical  lymphadenopathy  MUSCULOSKELETAL: Right ankle with splint in place.  Clinically it is well aligned.  Swelling appropriate.  No tenderness proximal to the splint.  No tenderness distal to the splint.  Foot is warm and well-perfused with intact sensation.  Assessment: Right ankle fracture   Plan: Plan for open treatment of her ankle fracture.  This is a bimalleolar fracture.  She will possibly require syndesmosis fixation.  This was discussed with the patient.  Questions were answered.  She is amenable to proceed.Marland Kitchen  We discussed the risks, benefits and alternatives of surgery which include but are not limited to wound healing complications, infection, nonunion, malunion, need for further surgery, damage to surrounding structures and continued pain.  They understand there is no guarantees to an acceptable outcome.  After weighing these risks they opted to proceed with surgery.     Erle Crocker, MD    02/09/2022 9:32 AM

## 2022-02-09 NOTE — Anesthesia Postprocedure Evaluation (Signed)
Anesthesia Post Note  Patient: Carmen Daniels  Procedure(s) Performed: OPEN TREATMENT OF RIGHT BIMALLEOLAR ANKLE FRACTURE (Right: Ankle)     Patient location during evaluation: PACU Anesthesia Type: General Level of consciousness: awake and alert and oriented Pain management: pain level controlled Vital Signs Assessment: post-procedure vital signs reviewed and stable Respiratory status: spontaneous breathing, nonlabored ventilation and respiratory function stable Cardiovascular status: blood pressure returned to baseline and stable Postop Assessment: no apparent nausea or vomiting Anesthetic complications: no   No notable events documented.  Last Vitals:  Vitals:   02/09/22 1245 02/09/22 1300  BP: 130/70 124/67  Pulse: 76 71  Resp: 17 11  Temp:    SpO2: 96% 95%    Last Pain:  Vitals:   02/09/22 1300  TempSrc:   PainSc: 0-No pain                 Nikodem Leadbetter A.

## 2022-02-09 NOTE — Anesthesia Preprocedure Evaluation (Signed)
Anesthesia Evaluation  Patient identified by MRN, date of birth, ID band Patient awake    Reviewed: Allergy & Precautions, NPO status , Patient's Chart, lab work & pertinent test results  History of Anesthesia Complications (+) PONV, Family history of anesthesia reaction and history of anesthetic complications  Airway Mallampati: II       Dental no notable dental hx. (+) Teeth Intact, Dental Advisory Given   Pulmonary asthma , pneumonia, resolved, Current Smoker and Patient abstained from smoking.   Pulmonary exam normal breath sounds clear to auscultation       Cardiovascular negative cardio ROS Normal cardiovascular exam Rhythm:Regular Rate:Normal     Neuro/Psych  Headaches, Seizures -, Well Controlled,  PSYCHIATRIC DISORDERS Anxiety Depression Bipolar Disorder   Hx/o borderline personality disorder Neuromuscular disease    GI/Hepatic ,,,(+)     substance abuse  Hx/o substance abuse on subutex   Endo/Other  negative endocrine ROS    Renal/GU   negative genitourinary   Musculoskeletal  (+) Arthritis , Osteoarthritis,  Fibromyalgia -Bimalleolar Fx right ankle with possible syndesmotic disruption   Abdominal   Peds  Hematology negative hematology ROS (+)   Anesthesia Other Findings   Reproductive/Obstetrics                              Anesthesia Physical Anesthesia Plan  ASA: 2  Anesthesia Plan: General   Post-op Pain Management: Regional block* and Minimal or no pain anticipated   Induction: Intravenous  PONV Risk Score and Plan: 4 or greater and Treatment may vary due to age or medical condition, Midazolam, Ondansetron and Dexamethasone  Airway Management Planned: LMA  Additional Equipment: None  Intra-op Plan:   Post-operative Plan: Extubation in OR  Informed Consent: I have reviewed the patients History and Physical, chart, labs and discussed the procedure including  the risks, benefits and alternatives for the proposed anesthesia with the patient or authorized representative who has indicated his/her understanding and acceptance.     Dental advisory given  Plan Discussed with: CRNA and Anesthesiologist  Anesthesia Plan Comments:          Anesthesia Quick Evaluation

## 2022-02-09 NOTE — Anesthesia Procedure Notes (Signed)
Procedure Name: LMA Insertion Date/Time: 02/09/2022 11:01 AM  Performed by: Valda Favia, CRNAPre-anesthesia Checklist: Patient identified, Emergency Drugs available, Suction available and Patient being monitored Patient Re-evaluated:Patient Re-evaluated prior to induction Oxygen Delivery Method: Circle System Utilized Preoxygenation: Pre-oxygenation with 100% oxygen Induction Type: IV induction Ventilation: Mask ventilation without difficulty LMA: LMA inserted LMA Size: 4.0 Number of attempts: 1 Airway Equipment and Method: Bite block Placement Confirmation: positive ETCO2 Tube secured with: Tape Dental Injury: Teeth and Oropharynx as per pre-operative assessment

## 2022-02-10 ENCOUNTER — Encounter (HOSPITAL_COMMUNITY): Payer: Self-pay | Admitting: Orthopaedic Surgery

## 2022-02-17 ENCOUNTER — Telehealth (HOSPITAL_COMMUNITY): Payer: Medicaid Other | Admitting: Psychiatry

## 2022-02-18 NOTE — Op Note (Signed)
Carmen Daniels female 36 y.o. 02/09/2022  PreOperative Diagnosis: Right bimalleolar ankle fracture, lateral and posterior  PostOperative Diagnosis: Same  PROCEDURE: Open reduction internal fixation of right bimalleolar ankle fracture  SURGEON: Melony Overly, MD  ASSISTANT: Jesse Martinique, PA-C was necessary patient positioning, prep, drape, assistance with placement of hardware and closure  ANESTHESIA: General with peripheral nerve block  FINDINGS: Displaced lateral malleolus fracture and posterior malleolus fracture  IMPLANTS: Arthrex distal fibular locking plate  [id number]:36 y.o. female sustained an ankle fracture when she tripped over her cat.  She had displacement of the lateral malleolus and there was concern for posterior malleolar fracture as well as possible syndesmotic disruption given the type of fracture.  She was indicated for surgery given the nature of the fracture   patient understood the risks, benefits and alternatives to surgery which include but are not limited to wound healing complications, infection, nonunion, malunion, need for further surgery as well as damage to surrounding structures. They also understood the potential for continued pain in that there were no guarantees of acceptable outcome After weighing these risks the patient opted to proceed with surgery.  PROCEDURE: Patient was identified in the preoperative holding area.  The right was marked by myself.  Consent was signed by myself and the patient.  Block was performed by anesthesia in the preoperative holding area.  Patient was taken to the operative suite and placed supine on the operative table.  General anesthesia was induced without difficulty. Bump was placed under the operative hip and bone foam was used.  All bony prominences were well padded.  Tourniquet was placed on the operative thigh.  Preoperative antibiotics were given. The extremity was prepped and draped in the usual sterile  fashion and surgical timeout was performed.  The limb was elevated and the tourniquet was inflated to 250 mmHg.    ORIF Lateral malleolus: We began by making a longitudinal incision overlying the distal fibula.  This was taken sharply down through skin and subcutaneous tissue.  Blunt dissection was used to identify any branch of the superficial peroneal nerve which was identified in the surgical field.  The incision was then taken sharply down to bone and the fracture site was identified.  The fracture site was mobilized.   The fracture site were cleaned with a rondure and curette of any fracture hematoma and callus formation.  Then the fracture of the fibula was reduced under direct visualization and held provisionally with a lobster claw.  Then fluoroscopy confirmed adequate reduction of the fibula at that time.  Then a combination of locking and nonlocking screws were used after placement of a lag screw across the fracture by technique.  This provided good stability of the distal fibula fracture.    Fluoroscopy was used to confirm appropriate reduction of the posterior malleolar fragment.  The posterior malleolar fragment was adequately reduced and not amenable to internal fixation.   Ankle stress view flouroscopy: Then under fluoroscopy the ankle was stressed using an anterior drawer, dorsiflex and external rotation moment and found to be stable with regard to medial clear space widening or syndesmotic widening.   The wounds were irrigated and the deep tissue was closed with a 3-0 nmonocryl  The subcuticular tissue was closed with 3-0 Monocryl and the skin with staples.  Xeroform placed on the wounds as well as 4 x 4's and sterile she cotton.  She tolerated this well.  There were no complications.  She was awakened from anesthesia and taken recovery  in stable condition.  POST OPERATIVE INSTRUCTIONS: Nonweightbearing on operative extremity Keep splint dry and limb elevated Call the office with  concerns Follow-up in 2 weeks for splint removal, x-rays of the operative ankle, nonweightbearing and suture removal if appropriate.   She will be placed in a walking boot and made weightbearing as tolerated.   TOURNIQUET TIME:less than one hour   BLOOD LOSS:  Minimal         DRAINS: none         SPECIMEN: none       COMPLICATIONS:  * No complications entered in OR log *         Disposition: PACU - hemodynamically stable.         Condition: stable

## 2022-02-25 ENCOUNTER — Telehealth: Payer: Self-pay | Admitting: Neurology

## 2022-02-25 NOTE — Telephone Encounter (Signed)
Carmen Daniels from [employer] called and Schedule Delivery for Jan 10th Signature is  required [place] and 200 Units will be delivered.

## 2022-03-05 ENCOUNTER — Encounter (HOSPITAL_COMMUNITY): Payer: Self-pay

## 2022-03-05 ENCOUNTER — Telehealth (HOSPITAL_COMMUNITY): Payer: Medicaid Other | Admitting: Psychiatry

## 2022-03-05 NOTE — Progress Notes (Signed)
Logged into [employer] app for patient's video visit and waited for 10 minutes but patient didn't show up.  Armando Reichert, MD PGY3 Psychiatry Resident  [facility]

## 2022-03-24 ENCOUNTER — Telehealth: Payer: Self-pay | Admitting: Neurology

## 2022-03-24 NOTE — Telephone Encounter (Signed)
Please start a new Botox PA before her next appt in may

## 2022-03-25 NOTE — Telephone Encounter (Signed)
Please let us know the updates, thanks.

## 2022-03-30 ENCOUNTER — Other Ambulatory Visit (HOSPITAL_COMMUNITY): Payer: Self-pay

## 2022-03-31 ENCOUNTER — Other Ambulatory Visit (HOSPITAL_COMMUNITY): Payer: Self-pay

## 2022-04-01 NOTE — Telephone Encounter (Signed)
Called pt. Left a VM message to please call office to discuss insurance.

## 2022-04-01 NOTE — Telephone Encounter (Signed)
Pt's mother has called back and provided pt's [id number]

## 2022-04-05 ENCOUNTER — Encounter (HOSPITAL_COMMUNITY): Payer: Self-pay | Admitting: Psychiatry

## 2022-04-05 ENCOUNTER — Other Ambulatory Visit (HOSPITAL_COMMUNITY): Payer: Self-pay

## 2022-04-05 NOTE — Telephone Encounter (Signed)
Could you check on this when you get a chance? Pt scheduled 2/14. Thank you!

## 2022-04-05 NOTE — Telephone Encounter (Signed)
Submitted Medicaid Botox PA form to [employer] via fax. Will f/u.

## 2022-04-06 ENCOUNTER — Other Ambulatory Visit (HOSPITAL_COMMUNITY): Payer: Self-pay

## 2022-04-06 NOTE — Telephone Encounter (Signed)
[facility], checking in for any updates on this. Thank you!

## 2022-04-06 NOTE — Telephone Encounter (Signed)
I called [employer] and spoke with Tamika-states PA has been approved.  Approval dates are  from 04/05/2022-03/31/2023.  PA approval# [id number]

## 2022-04-07 ENCOUNTER — Ambulatory Visit: Payer: Self-pay | Admitting: Neurology

## 2022-04-12 ENCOUNTER — Telehealth: Payer: Self-pay | Admitting: Neurology

## 2022-04-12 NOTE — Telephone Encounter (Signed)
Pt's mother is asking if pt can be seen earlier than May as a result of pt missing appointment from last week.  Phone rep informed mother that the request would be sent to RN for review.

## 2022-04-14 NOTE — Telephone Encounter (Signed)
Ok to [place], last injection was on Nov 21

## 2022-04-14 NOTE — Telephone Encounter (Signed)
LVM  Trying to add pt to see dr Krista Blue for botox today.

## 2022-04-16 ENCOUNTER — Other Ambulatory Visit (HOSPITAL_COMMUNITY): Payer: Self-pay | Admitting: Psychiatry

## 2022-04-16 DIAGNOSIS — F319 Bipolar disorder, unspecified: Secondary | ICD-10-CM

## 2022-04-19 ENCOUNTER — Other Ambulatory Visit (HOSPITAL_COMMUNITY): Payer: Self-pay | Admitting: Neurology

## 2022-04-19 DIAGNOSIS — F319 Bipolar disorder, unspecified: Secondary | ICD-10-CM

## 2022-04-23 ENCOUNTER — Other Ambulatory Visit (HOSPITAL_COMMUNITY): Payer: Self-pay | Admitting: Psychiatry

## 2022-04-23 DIAGNOSIS — F319 Bipolar disorder, unspecified: Secondary | ICD-10-CM

## 2022-05-17 ENCOUNTER — Ambulatory Visit (INDEPENDENT_AMBULATORY_CARE_PROVIDER_SITE_OTHER): Payer: Medicaid Other | Admitting: Psychiatry

## 2022-05-17 ENCOUNTER — Encounter (HOSPITAL_COMMUNITY): Payer: Self-pay | Admitting: Psychiatry

## 2022-05-17 DIAGNOSIS — F4010 Social phobia, unspecified: Secondary | ICD-10-CM

## 2022-05-17 DIAGNOSIS — F319 Bipolar disorder, unspecified: Secondary | ICD-10-CM

## 2022-05-17 MED ORDER — TRAZODONE HCL 100 MG PO TABS: 100.0000 mg | ORAL_TABLET | Freq: Every evening | ORAL | 3 refills | Status: DC | PRN

## 2022-05-17 MED ORDER — CAPLYTA 42 MG PO CAPS: 42.0000 mg | ORAL_CAPSULE | Freq: Every day | ORAL | 3 refills | Status: DC

## 2022-05-17 MED ORDER — DULOXETINE HCL 60 MG PO CPEP: 120.0000 mg | ORAL_CAPSULE | Freq: Every day | ORAL | 2 refills | Status: DC

## 2022-05-17 MED ORDER — HALOPERIDOL 2 MG PO TABS: 2.0000 mg | ORAL_TABLET | Freq: Every evening | ORAL | 3 refills | Status: DC

## 2022-05-17 NOTE — Progress Notes (Signed)
BH MD/PA/NP OP Progress Note Virtual Visit via Video Note  I connected with Carmen Daniels on 05/17/22 at  1:00 PM EDT by a video enabled telemedicine application and verified that I am speaking with the correct person using two identifiers.  Location: Patient: Home Provider: Clinic   I discussed the limitations of evaluation and management by telemedicine and the availability of in person appointments. The patient expressed understanding and agreed to proceed.  I provided 30 minutes of non-face-to-face time during this encounter.    05/17/2022 1:48 PM Carmen Daniels  MRN:  [id number]  Chief Complaint: "I am feeling better" Mother "Is her medications okay together"    HPI: 37 year-old female seen today for follow-up psychiatric evaluation. She has a psychiatric history of bipolar disorder, borderline personality disorder, anxiety, depression, and SI/SA.  She was recently hospitalized at [facility] [place] for increasing depression and SI on 05/07/2022 through 05/11/2022.  She is currently managed on Cymbalta 120 mg daily, Caplyta 42 mg daily, and trazodone 100 mg nightly as needed.  Patient is also prescribed Ambien 10 mg nightly,  gabapentin 400 mg 4 times daily, and Klonopin 1 mg 4 times daily as needed which are all prescribed by her PCP.  She notes medications are somewhat effective in managing her psychiatric conditions.  Today she was well-groomed, pleasant, cooperative, engaged in conversation.  She informed Probation officer that she feels somewhat better since her hospitalization.  Patient informed writer that while hospitalized she was given as needed Haldol and notes that her anxiety, mood, and irritability improved.  She asked writer if she could be given a low-dose of Haldol to help manage the symptoms.  Provider was agreeable to this.  Provider discussed risk and benefits of being on Haldol and Caplyta at the same time.  Patient endorsed understanding.    Patient informed Probation officer that she feels  overwhelmed due to the passing of a loved one.  She notes that this death caused her to spiral into depression and increased her suicidal ideation.  Patient informed Probation officer that she was coping with the loss of a loved one and has been given resources to grief counseling which she is looking into.  Provider informed patient that if she is unable to find a grief counselor that a therapist could be provided for her at [facility] behavioral health.  She endorsed understanding and agreed.  Today provider conducted a GAD-7 and patient scored a 13.  Provider also conducted PHQ-9 the patient scored a 6.  She endorses having a poor appetite.  Today she denies SI/HI/VAH.  Patient informed writer that prior to hospitalization she experienced manic-like symptoms.    Patient informed writer that recently she has been having dry mouth.  Provider informed patient that gabapentin and Caplyta at times can cause dry mouth.  Provider encouraged patient to stay hydrated.  She endorsed understanding and agreed.  Patient was seen with her mother who ask if her current medication regimen is appropriate.  Provider informed patient that she is currently taking Klonopin, Ambien, and oxycodone.  Provider informed patient that benzodiazepines, hypnotics, and narcotics should not be used together in combination for an extended period..  She endorsed understanding.  Provider recommended she speak with her PCP today about reducing/discontinuing some of these medications.  She endorsed understanding and notes that she will be seeing her PCP today.  Today patient agreeable to starting Haldol 2 mg nightly to help manage irritability and mood.  She will continue her other medications as  prescribed. Potential side effects of medication and risks vs benefits of treatment vs non-treatment were explained and discussed. All questions were answered. No other concerns at this time.   Visit Diagnosis:    ICD-10-CM   1. Bipolar 1 disorder,  depressed (HCC)  F31.9 DULoxetine (CYMBALTA) 60 MG capsule    lumateperone tosylate (CAPLYTA) 42 MG capsule    traZODone (DESYREL) 100 MG tablet    haloperidol (HALDOL) 2 MG tablet    2. Social anxiety disorder  F40.10 traZODone (DESYREL) 100 MG tablet        Past Psychiatric History: bipolar disorder, borderline personality disorder, anxiety, depression, and SI/SA Past Medical History:  Past Medical History:  Diagnosis Date   Allergic rhinitis    Anxiety    Arthritis    Asthma    Bipolar disorder ([place])    Borderline personality disorder ([place])    Bupropion overdose    Chronic pain syndrome    Depression    Family history of adverse reaction to anesthesia    Mom - N/V   Fibromyalgia    generalized   Hallucinations    02/08/22- have cleared mom said.   Migraines    Nasal polyps    OCD (obsessive compulsive disorder)    Ovarian cyst    left   Pneumonia    2021   PONV (postoperative nausea and vomiting)    Schizophrenia (HCC)    schizoaffective   Seizures (HCC)    patient attenped sudide by tak alot of her medication,.    Past Surgical History:  Procedure Laterality Date   ABDOMINAL HYSTERECTOMY     LAPAROSCOPIC TUBAL LIGATION Bilateral 06/13/2019   Procedure: LAPAROSCOPIC TUBAL LIGATION;  Surgeon: Chancy Milroy, MD;  Location: [place];  Service: Gynecology;  Laterality: Bilateral;  FILSHIE CLIPS   NASAL SINUS SURGERY     ORIF ANKLE FRACTURE Right 02/09/2022   Procedure: OPEN TREATMENT OF RIGHT BIMALLEOLAR ANKLE FRACTURE;  Surgeon: Erle Crocker, MD;  Location: [place];  Service: Orthopedics;  Laterality: Right;   TUBAL LIGATION     tubes in ears     VAGINAL HYSTERECTOMY Bilateral 01/15/2020   Procedure: HYSTERECTOMY VAGINAL;  Surgeon: Chancy Milroy, MD;  Location: [place];  Service: Gynecology;  Laterality: Bilateral;   WISDOM TOOTH EXTRACTION      Family Psychiatric History: Paternal grandmother depression Family History:  Family  History  Problem Relation Age of Onset   Healthy Mother    Healthy Father    Arthritis Maternal Grandmother    Arthritis Maternal Grandfather    Depression Paternal Grandmother    Hyperlipidemia Other    Stroke Other    Hypertension Other    Cancer Other    COPD Other    Asthma Other     Social History:  Social History   Socioeconomic History   Marital status: Married    Spouse name: Not on file   Number of children: 0   Years of education: 14   Highest education level: Not on file  Occupational History   Occupation: Unemployed  Tobacco Use   Smoking status: Every Day    Packs/day: 0.50    Years: 15.00    Additional pack years: 0.00    Total pack years: 7.50    Types: Cigarettes   Smokeless tobacco: Never  Vaping Use   Vaping Use: Every day   Substances: Nicotine  Substance and Sexual Activity   Alcohol use: Never   Drug use: Yes  Comment: prescribed narcotics   Sexual activity: Not on file  Other Topics Concern   Not on file  Social History Narrative   Lives alone   Right-handed.   3 glasses of green tea per day.   Social Determinants of Health   Financial Resource Strain: Not on file  Food Insecurity: No Food Insecurity (10/29/2020)   Hunger Vital Sign    Worried About Running Out of Food in the Last Year: Never true    Ran Out of Food in the Last Year: Never true  Transportation Needs: Not on file  Physical Activity: Not on file  Stress: Not on file  Social Connections: Not on file    Allergies:  Allergies  Allergen Reactions   Keflex [Cephalexin] Anaphylaxis    Has tolerated amoxicillin 123456    Metabolic Disorder Labs: Lab Results  Component Value Date   HGBA1C 4.7 (L) 02/07/2021   MPG 88.19 02/07/2021   No results found for: "PROLACTIN" Lab Results  Component Value Date   TRIG 341 (H) 04/03/2019   Lab Results  Component Value Date   TSH 0.972 02/15/2021   TSH 4.304 07/23/2020    Therapeutic Level Labs: Lab Results   Component Value Date   LITHIUM 0.4 (L) 04/24/2021   LITHIUM <0.06 (L) 07/30/2019   No results found for: "VALPROATE" No results found for: "CBMZ"  Current Medications: Current Outpatient Medications  Medication Sig Dispense Refill   haloperidol (HALDOL) 2 MG tablet Take 1 tablet (2 mg total) by mouth at bedtime. 30 tablet 3   Atogepant (QULIPTA) 60 MG TABS Take 60 mg by mouth daily. (Patient taking differently: Take 60 mg by mouth daily. Does not have, waiting for [employer] to approved) 30 tablet 11   beclomethasone (QVAR) 40 MCG/ACT inhaler Inhale 1 puff into the lungs 2 (two) times daily as needed (asthma).      botulinum toxin Type A (BOTOX) 100 units SOLR injection Inject 200 Units into the muscle every 3 (three) months. Inject into head and neck muscles 2 each 3   clonazePAM (KLONOPIN) 1 MG tablet Take 1 mg by mouth 4 (four) times daily.     DULoxetine (CYMBALTA) 60 MG capsule Take 2 capsules (120 mg total) by mouth daily. 60 capsule 2   gabapentin (NEURONTIN) 400 MG capsule Take 400 mg by mouth 4 (four) times daily.     lumateperone tosylate (CAPLYTA) 42 MG capsule Take 1 capsule (42 mg total) by mouth daily. 30 capsule 3   Multiple Vitamins-Calcium (ONE-A-DAY WOMENS FORMULA PO) Take 1 tablet by mouth daily.     oxyCODONE (ROXICODONE) 5 MG immediate release tablet Take 1 tablet (5 mg total) by mouth every 4 (four) hours as needed for severe pain. 30 tablet 0   SUMAtriptan (IMITREX) 100 MG tablet May repeat in 2 hours if headache persists or recurs. Do no refill in less than 30 days 10 tablet 11   traZODone (DESYREL) 100 MG tablet Take 1 tablet (100 mg total) by mouth at bedtime as needed for sleep. 30 tablet 3   Current Facility-Administered Medications  Medication Dose Route Frequency Provider Last Rate Last Admin   botulinum toxin Type A (BOTOX) injection 200 Units  200 Units Intramuscular Once Marcial Pacas, MD       botulinum toxin Type A (BOTOX) injection 200 Units  200 Units  Intramuscular Once Marcial Pacas, MD         Musculoskeletal: Strength & Muscle Tone: within normal limits and telehealth visit Gait &  Station: normal, telehealth visit Patient leans: N/A  Psychiatric Specialty Exam: Review of Systems  There were no vitals taken for this visit.There is no height or weight on file to calculate BMI.  General Appearance: Well Groomed  Eye Contact:  Good  Speech:  Clear and Coherent and Normal Rate  Volume:  Normal  Mood:  Anxious and improving  Affect:  Congruent  Thought Process:  Coherent, Goal Directed and Linear  Orientation:  Full (Time, Place, and Person)  Thought Content: WDL and Logical   Suicidal Thoughts:  No  Homicidal Thoughts:  No  Memory:  Immediate;   Good Recent;   Good Remote;   Good  Judgement:  Good  Insight:  Good  Psychomotor Activity:  Normal  Concentration:  Concentration: Good and Attention Span: Good  Recall:  Good  Fund of Knowledge: Good  Language: Good  Akathisia:  No  Handed:  Right  AIMS (if indicated):Done  Assets:  Communication Skills Desire for Improvement Financial Resources/Insurance Housing Social Support  ADL's:  Intact  Cognition: WNL  Sleep:  Good   Screenings: AIMS    Flowsheet Row Clinical Support from 11/26/2019 in [facility] Admission (Discharged) from 04/10/2017 in [place] Total Score 5 0      GAD-7    [place] Office Visit from 05/17/2022 in [facility] Office Visit from 08/26/2021 in [facility] Video Visit from 10/30/2020 in [facility] Video Visit from 08/11/2020 in [place] from 03/31/2020 in [facility]  Total GAD-7 Score 13 17 20 13 18       [place] Office Visit from 10/05/2016 in [facility]   Total Score (max 30 points ) 26      PHQ2-9    [place] Visit from 05/17/2022 in [facility] Office Visit from 08/26/2021 in [facility] Nutrition from 10/29/2020 in [facility] Video Visit from 08/11/2020 in [facility] ED from 08/05/2020 in [facility] Emergency Department at [facility]  PHQ-2 Total Score 1 6 3 4 4   PHQ-9 Total Score 6 19 18 15 8       Flowsheet Row Admission (Discharged) from 02/09/2022 in [place] ED from 09/18/2020 in [facility] Emergency Department at [facility] ED from 09/16/2020 in [facility] Emergency Department at [place] Moderate Risk No Risk No Risk        Assessment and Plan: Patient endorse anxiety.  She notes her depression and mood has somewhat improved but notes that at times she becomes irritable.  Patient notes that prior to her hospitalization she had symptoms of mania.  She notes that while hospitalized Haldol improved her irritability, racing thoughts, and fluctuations in mood.  Today she is agreeable to starting Haldol 2 mg nightly to help manage mood.  Sleep she will continue her other medications as prescribed.  Patient also complained of dry mouth.  Provider informed patient that gabapentin and Caplyta can cause dry mouth.  Provider encouraged patient to stay hydrated.  Patient's mother concerned about drug interactions.  Provider informed patient that she is currently taking Klonopin, Ambien, and oxycodone.  Provider informed patient that benzodiazepines, hypnotics, and narcotics should not be used together in  combination for an extended period. She endorsed understanding.  Provider recommended she speak with her PCP today about reducing/discontinuing some of these medications.  She endorsed understanding and notes that she will be seeing  her PCP today.    1. Bipolar 1 disorder, depressed ([place])  Continue- DULoxetine (CYMBALTA) 60 MG capsule; Take 2 capsules (120 mg total) by mouth daily.  Dispense: 60 capsule; Refill: 2 Continue- lumateperone tosylate (CAPLYTA) 42 MG capsule; Take 1 capsule (42 mg total) by mouth daily.  Dispense: 30 capsule; Refill: 3 Continue- traZODone (DESYREL) 100 MG tablet; Take 1 tablet (100 mg total) by mouth at bedtime as needed for sleep.  Dispense: 30 tablet; Refill: 3 Start- haloperidol (HALDOL) 2 MG tablet; Take 1 tablet (2 mg total) by mouth at bedtime.  Dispense: 30 tablet; Refill: 3  2. Social anxiety disorder  Continue- traZODone (DESYREL) 100 MG tablet; Take 1 tablet (100 mg total) by mouth at bedtime as needed for sleep.  Dispense: 30 tablet; Refill: 3     Follow-up in 2 months. Follow-up with outpatient therapy   Salley Slaughter, NP 05/17/2022, 1:48 PM

## 2022-05-19 ENCOUNTER — Telehealth (HOSPITAL_COMMUNITY): Payer: Self-pay | Admitting: *Deleted

## 2022-05-19 NOTE — Telephone Encounter (Signed)
Prior authorization of Caplyta received, called [employer] spoke with Tamika who gave approval until 05/14/23. Auth [id number]. Notified pharmacy of approval.

## 2022-06-01 ENCOUNTER — Telehealth (HOSPITAL_COMMUNITY): Payer: Self-pay | Admitting: *Deleted

## 2022-06-01 NOTE — Telephone Encounter (Signed)
Called [place] [employer] for prior authorization of Caplyta. Spoke with Lupita Leash who gave approval from 06/01/22-/05/27/23. Auth #[id number]. Called to notify pharmacy.

## 2022-06-04 ENCOUNTER — Emergency Department (HOSPITAL_COMMUNITY)
Admission: EM | Admit: 2022-06-04 | Discharge: 2022-06-05 | Disposition: A | Discharge status: Home / Self Care | Payer: Medicaid Other | Attending: Emergency Medicine | Admitting: Emergency Medicine

## 2022-06-04 ENCOUNTER — Encounter (HOSPITAL_COMMUNITY): Payer: Self-pay | Admitting: Emergency Medicine

## 2022-06-04 ENCOUNTER — Other Ambulatory Visit: Payer: Self-pay

## 2022-06-04 DIAGNOSIS — R112 Nausea with vomiting, unspecified: Secondary | ICD-10-CM | POA: Insufficient documentation

## 2022-06-04 DIAGNOSIS — L03317 Cellulitis of buttock: Secondary | ICD-10-CM | POA: Insufficient documentation

## 2022-06-04 DIAGNOSIS — E876 Hypokalemia: Secondary | ICD-10-CM | POA: Insufficient documentation

## 2022-06-04 DIAGNOSIS — R1032 Left lower quadrant pain: Secondary | ICD-10-CM | POA: Insufficient documentation

## 2022-06-04 NOTE — ED Triage Notes (Signed)
Patient BIB EMS from home c/o abscess on her right butt. Pt report worsening pain on affected area. Pt report N/V/D. Pt a/ox4.

## 2022-06-05 ENCOUNTER — Emergency Department (HOSPITAL_COMMUNITY): Payer: Medicaid Other

## 2022-06-05 LAB — COMPREHENSIVE METABOLIC PANEL
ALT: 11 U/L (ref 0–44)
AST: 13 U/L — ABNORMAL LOW (ref 15–41)
Albumin: 3.4 g/dL — ABNORMAL LOW (ref 3.5–5.0)
Alkaline Phosphatase: 92 U/L (ref 38–126)
Anion gap: 10 (ref 5–15)
BUN: 7 mg/dL (ref 6–20)
CO2: 24 mmol/L (ref 22–32)
Calcium: 9.3 mg/dL (ref 8.9–10.3)
Chloride: 103 mmol/L (ref 98–111)
Creatinine, Ser: 0.68 mg/dL (ref 0.44–1.00)
GFR, Estimated: >60 mL/min (ref >60)
Glucose, Bld: 112 mg/dL — ABNORMAL HIGH (ref 70–99)
Potassium: 3.2 mmol/L — ABNORMAL LOW (ref 3.5–5.1)
Sodium: 137 mmol/L (ref 135–145)
Total Bilirubin: 0.6 mg/dL (ref 0.3–1.2)
Total Protein: 7.4 g/dL (ref 6.5–8.1)

## 2022-06-05 LAB — URINALYSIS, ROUTINE W REFLEX MICROSCOPIC
Bilirubin Urine: NEGATIVE
Glucose, UA: NEGATIVE mg/dL
Hgb urine dipstick: NEGATIVE
Ketones, ur: 5 mg/dL — AB
Leukocytes,Ua: NEGATIVE
Nitrite: NEGATIVE
Protein, ur: NEGATIVE mg/dL
Specific Gravity, Urine: 1.016 (ref 1.005–1.030)
pH: 7 (ref 5.0–8.0)

## 2022-06-05 LAB — CBC WITH DIFFERENTIAL/PLATELET
Abs Immature Granulocytes: 0.05 10*3/uL (ref 0.00–0.07)
Basophils Absolute: 0 10*3/uL (ref 0.0–0.1)
Basophils Relative: 1 %
Eosinophils Absolute: 0 10*3/uL (ref 0.0–0.5)
Eosinophils Relative: 0 %
HCT: 41.3 % (ref 36.0–46.0)
Hemoglobin: 13.9 g/dL (ref 12.0–15.0)
Immature Granulocytes: 1 %
Lymphocytes Relative: 21 %
Lymphs Abs: 1.6 10*3/uL (ref 0.7–4.0)
MCH: 27.7 pg (ref 26.0–34.0)
MCHC: 33.7 g/dL (ref 30.0–36.0)
MCV: 82.4 fL (ref 80.0–100.0)
Monocytes Absolute: 0.5 10*3/uL (ref 0.1–1.0)
Monocytes Relative: 6 %
Neutro Abs: 5.3 10*3/uL (ref 1.7–7.7)
Neutrophils Relative %: 71 %
Platelets: 478 10*3/uL — ABNORMAL HIGH (ref 150–400)
RBC: 5.01 MIL/uL (ref 3.87–5.11)
RDW: 13.5 % (ref 11.5–15.5)
WBC: 7.5 10*3/uL (ref 4.0–10.5)
nRBC: 0 % (ref 0.0–0.2)

## 2022-06-05 LAB — HCG, QUANTITATIVE, PREGNANCY: hCG, Beta Chain, Quant, S: <1 m[IU]/mL (ref <5)

## 2022-06-05 LAB — LIPASE, BLOOD: Lipase: 33 U/L (ref 11–51)

## 2022-06-05 MED ORDER — SODIUM CHLORIDE (PF) 0.9 % IJ SOLN
INTRAMUSCULAR | Status: AC
  Filled 2022-06-05: qty 50

## 2022-06-05 MED ORDER — NAPROXEN 500 MG PO TABS
500.0000 mg | ORAL_TABLET | Freq: Once | ORAL | Status: AC
  Administered 2022-06-05: 500 mg via ORAL
  Filled 2022-06-05: qty 1

## 2022-06-05 MED ORDER — DOXYCYCLINE HYCLATE 100 MG PO TABS
100.0000 mg | ORAL_TABLET | Freq: Once | ORAL | Status: AC
  Administered 2022-06-05: 100 mg via ORAL
  Filled 2022-06-05: qty 1

## 2022-06-05 MED ORDER — HYDROCODONE-ACETAMINOPHEN 5-325 MG PO TABS
1.0000 | ORAL_TABLET | Freq: Once | ORAL | Status: AC
  Administered 2022-06-05: 1 via ORAL
  Filled 2022-06-05: qty 1

## 2022-06-05 MED ORDER — METOCLOPRAMIDE HCL 10 MG PO TABS: 10.0000 mg | ORAL_TABLET | Freq: Four times a day (QID) | ORAL | 0 refills | Status: DC

## 2022-06-05 MED ORDER — DOXYCYCLINE HYCLATE 100 MG PO CAPS: 100.0000 mg | ORAL_CAPSULE | Freq: Two times a day (BID) | ORAL | 0 refills | Status: DC

## 2022-06-05 MED ORDER — SODIUM CHLORIDE 0.9 % IV BOLUS
1000.0000 mL | Freq: Once | INTRAVENOUS | Status: AC
  Administered 2022-06-05: 1000 mL via INTRAVENOUS

## 2022-06-05 MED ORDER — METOCLOPRAMIDE HCL 5 MG/ML IJ SOLN
10.0000 mg | Freq: Once | INTRAMUSCULAR | Status: AC
  Administered 2022-06-05: 10 mg via INTRAVENOUS
  Filled 2022-06-05: qty 2

## 2022-06-05 MED ORDER — NAPROXEN 500 MG PO TABS: 500.0000 mg | ORAL_TABLET | Freq: Two times a day (BID) | ORAL | 0 refills | Status: DC

## 2022-06-05 MED ORDER — POTASSIUM CHLORIDE CRYS ER 20 MEQ PO TBCR
40.0000 meq | EXTENDED_RELEASE_TABLET | Freq: Once | ORAL | Status: AC
  Administered 2022-06-05: 40 meq via ORAL
  Filled 2022-06-05: qty 2

## 2022-06-05 MED ORDER — IOHEXOL 300 MG/ML  SOLN
100.0000 mL | Freq: Once | INTRAMUSCULAR | Status: AC | PRN
  Administered 2022-06-05: 100 mL via INTRAVENOUS

## 2022-06-05 NOTE — Discharge Instructions (Signed)
Seen today for infection on your left buttock.  The CT scan did not show an abscess.  You are being treated with antibiotics for the action of your skin.  Make sure you finish the entire course.  Your potassium was somewhat low as well.  You were given potassium here and need to make sure you are including foods in your diet with lots of potassium such as bananas, orange juice and potatoes.  This was likely due to your vomiting.  You are given prescription for nausea medicine as well.  Come back if you have fever, worsening pain, increased swelling or any other worrisome changes.

## 2022-06-05 NOTE — ED Provider Notes (Signed)
[place] EMERGENCY DEPARTMENT AT [facility] Provider Note   CSN: [id number] Arrival date & time: 06/04/22  2223     History  Chief Complaint  Patient presents with   Abscess    Carmen Daniels is a 37 y.o. female.  Reports history of migraines and frequent abscesses past diagnosis of MRSA.  Presents the ER complaining of left buttock abscess.  States is going on for over a week and did drain some but has not gotten better as she had expected it to.  She states she has not checked her temperature but has felt warm and been having abdominal pain with nausea.  Also reports diarrhea.  Denies dysuria or frequency.  She is the pain has been very bad which is what brought her into the ER today.  States due to the nausea vomiting, she is feeling very dehydrated   Abscess      Home Medications Prior to Admission medications   Medication Sig Start Date End Date Taking? Authorizing Provider  doxycycline (VIBRAMYCIN) 100 MG capsule Take 1 capsule (100 mg total) by mouth 2 (two) times daily. 06/05/22  Yes Ceclia Koker A, PA-C  metoCLOPramide (REGLAN) 10 MG tablet Take 1 tablet (10 mg total) by mouth every 6 (six) hours. 06/05/22  Yes Tagen Brethauer A, PA-C  naproxen (NAPROSYN) 500 MG tablet Take 1 tablet (500 mg total) by mouth 2 (two) times daily. 06/05/22  Yes Balraj Brayfield A, PA-C  Atogepant (QULIPTA) 60 MG TABS Take 60 mg by mouth daily. Patient taking differently: Take 60 mg by mouth daily. Does not have, waiting for [employer] to approved 01/12/22   Levert Feinstein, MD  beclomethasone (QVAR) 40 MCG/ACT inhaler Inhale 1 puff into the lungs 2 (two) times daily as needed (asthma).     [provider]  botulinum toxin Type A (BOTOX) 100 units SOLR injection Inject 200 Units into the muscle every 3 (three) months. Inject into head and neck muscles 04/30/19   Levert Feinstein, MD  clonazePAM (KLONOPIN) 1 MG tablet Take 1 mg by mouth 4 (four) times daily. 07/14/19   [provider]  DULoxetine (CYMBALTA) 60 MG capsule Take 2 capsules (120 mg total) by mouth daily. 05/17/22   Shanna Cisco, NP  gabapentin (NEURONTIN) 400 MG capsule Take 400 mg by mouth 4 (four) times daily.    [provider]  haloperidol (HALDOL) 2 MG tablet Take 1 tablet (2 mg total) by mouth at bedtime. 05/17/22   Shanna Cisco, NP  lumateperone tosylate (CAPLYTA) 42 MG capsule Take 1 capsule (42 mg total) by mouth daily. 05/17/22   Shanna Cisco, NP  Multiple Vitamins-Calcium (ONE-A-DAY WOMENS FORMULA PO) Take 1 tablet by mouth daily.    [provider]  oxyCODONE (ROXICODONE) 5 MG immediate release tablet Take 1 tablet (5 mg total) by mouth every 4 (four) hours as needed for severe pain. 02/09/22   [place], Jesse J, PA-C  SUMAtriptan (IMITREX) 100 MG tablet May repeat in 2 hours if headache persists or recurs. Do no refill in less than 30 days 01/12/22   Levert Feinstein, MD  traZODone (DESYREL) 100 MG tablet Take 1 tablet (100 mg total) by mouth at bedtime as needed for sleep. 05/17/22   Shanna Cisco, NP      Allergies    Keflex [cephalexin]    Review of Systems   Review of Systems  Physical Exam Updated Vital Signs BP 113/76   Pulse (!) 53  Temp 98.1 F (36.7 C) (Oral)   Resp 17   Ht  (1.803 m)   Wt 72.6 kg   LMP  (LMP Unknown)   SpO2 100%   BMI 22.32 kg/m  Physical Exam Vitals and nursing note reviewed.  Constitutional:      General: She is not in acute distress.    Appearance: She is well-developed.  HENT:     Head: Normocephalic and atraumatic.     Mouth/Throat:     Mouth: Mucous membranes are moist.  Eyes:     Conjunctiva/sclera: Conjunctivae normal.  Cardiovascular:     Rate and Rhythm: Normal rate and regular rhythm.     Heart sounds: No murmur heard. Pulmonary:     Effort: Pulmonary effort is normal. No respiratory distress.     Breath sounds: Normal breath sounds.  Abdominal:     Palpations: Abdomen is soft.     Tenderness:  There is abdominal tenderness.     Comments: Tenderness to left lower quadrant and suprapubic area  Musculoskeletal:        General: No swelling.     Cervical back: Neck supple.  Skin:    General: Skin is warm and dry.     Capillary Refill: Capillary refill takes less than 2 seconds.     Comments: Approximately 7 cm area of erythema to left buttock with central area of previous drainage that has a scab overlying.  There is no induration or fluctuance noted.  Neurological:     General: No focal deficit present.     Mental Status: She is alert.  Psychiatric:        Mood and Affect: Mood normal.     ED Results / Procedures / Treatments   Labs (all labs ordered are listed, but only abnormal results are displayed) Labs Reviewed  CBC WITH DIFFERENTIAL/PLATELET - Abnormal; Notable for the following components:      Result Value   Platelets 478 (*)    All other components within normal limits  COMPREHENSIVE METABOLIC PANEL - Abnormal; Notable for the following components:   Potassium 3.2 (*)    Glucose, Bld 112 (*)    Albumin 3.4 (*)    AST 13 (*)    All other components within normal limits  URINALYSIS, ROUTINE W REFLEX MICROSCOPIC - Abnormal; Notable for the following components:   Color, Urine AMBER (*)    Ketones, ur 5 (*)    Bacteria, UA RARE (*)    All other components within normal limits  LIPASE, BLOOD  HCG, QUANTITATIVE, PREGNANCY    EKG None  Radiology CT ABDOMEN PELVIS W CONTRAST  Result Date: 06/05/2022 CLINICAL DATA:  Lower abdominal pain and buttock abscess. EXAM: CT ABDOMEN AND PELVIS WITH CONTRAST TECHNIQUE: Multidetector CT imaging of the abdomen and pelvis was performed using the standard protocol following bolus administration of intravenous contrast. RADIATION DOSE REDUCTION: This exam was performed according to the departmental dose-optimization program which includes automated exposure control, adjustment of the mA and/or kV according to patient size and/or  use of iterative reconstruction technique. CONTRAST:  OMNIPAQUE IOHEXOL 300 MG/ML  SOLN COMPARISON:  08/06/2020. FINDINGS: Lower chest: No acute abnormality. Hepatobiliary: No focal liver abnormality is seen. Fatty infiltration of the liver is noted. No gallstones, gallbladder wall thickening, or biliary dilatation. Pancreas: Unremarkable. No pancreatic ductal dilatation or surrounding inflammatory changes. Spleen: A subcentimeter hypodensity is present in the superior aspect of the spleen, likely cyst or hemangioma. Adrenals/Urinary Tract: The adrenal glands are  within normal limits. The kidneys enhance symmetrically. No renal calculus or hydronephrosis. The bladder is unremarkable. Stomach/Bowel: There is a small hiatal hernia. Stomach is within normal limits. Appendix appears normal. No evidence of bowel wall thickening, distention, or inflammatory changes. No free air or pneumatosis. Vascular/Lymphatic: No significant vascular findings are present. No enlarged abdominal or pelvic lymph nodes. Reproductive: The uterus is surgically absent. A simple cyst is present in the left ovary measuring 2.5 cm. No adnexal mass on the right. Other: No abdominopelvic ascites. Subcutaneous fat stranding is present over the right buttock. No abscess is seen. A small fat containing umbilical hernia is present. Musculoskeletal: No acute osseous abnormality. IMPRESSION: 1. Subcutaneous fat stranding over the right buttock. No evidence of abscess. 2. No acute process in the abdomen and pelvis. 3. Hepatic steatosis. 4. Small hiatal hernia. Electronically Signed   By: Thornell Sartorius M.D.   On: 06/05/2022 02:06    Procedures Procedures    Medications Ordered in ED Medications  HYDROcodone-acetaminophen (NORCO/VICODIN) 5-325 MG per tablet 1 tablet (1 tablet Oral Given 06/05/22 0111)  metoCLOPramide (REGLAN) injection 10 mg (10 mg Intravenous Given 06/05/22 0111)  sodium chloride 0.9 % bolus 1,000 mL (0 mLs Intravenous  Stopped 06/05/22 0249)  iohexol (OMNIPAQUE) 300 MG/ML solution 100 mL (100 mLs Intravenous Contrast Given 06/05/22 0139)  potassium chloride SA (KLOR-CON M) CR tablet 40 mEq (40 mEq Oral Given 06/05/22 0254)  doxycycline (VIBRA-TABS) tablet 100 mg (100 mg Oral Given 06/05/22 0253)  naproxen (NAPROSYN) tablet 500 mg (500 mg Oral Given 06/05/22 0253)    ED Course/ Medical Decision Making/ A&P                             Medical Decision Making This patient presents to the ED for concern of left buttock abscess with abdominal pain, nausea vomiting and diarrhea  this involves an extensive number of treatment options, and is a complaint that carries with it a high risk of complications and morbidity.  The differential diagnosis includes gastritis, gastroenteritis, appendicitis, pancreatitischolecystitis, diverticulitis, DKA, nephrolithiasis, gastroparesis, denies abscess, cellulitis other    Co morbidities that complicate the patient evaluation  History of bipolar, chronic pain, frequent abscesses   Additional history obtained:  Additional history obtained from EMR External records from outside source obtained and reviewed including orthopedic notes, prior ED notes and hospital medicine notes   Lab Tests:  I Ordered, and personally interpreted labs.  The pertinent results include: Hypokalemia with potassium 3.2, CBC shows no leukocytosis, no anemia   Imaging Studies ordered:  I ordered imaging studies including CT abdomen pelvis I independently visualized and interpreted imaging which showed no acute intra-abdominal or pelvic process.  Stranding and soft tissue of left buttock with no fluid collection I agree with the radiologist interpretation   Cardiac Monitoring: / EKG:  The patient was maintained on a cardiac monitor.  I personally viewed and interpreted the cardiac monitored which showed an underlying rhythm of: Sinus rhythm   Problem List / ED Course / Critical interventions /  Medication management  Patient comes in with pain and swelling to left buttock and nausea vomiting and abdominal pain.  The left buttock abscess has been going on for a week and a drained at home.  She has continued pain but denies fevers or chills but is having nausea and vomiting.  Noted to be mildly hypokalemic which was repleted, she is having no more vomiting, tolerating p.o., pain  is controlled, discussed with her CT did not show any acute intra-abdominal process, her abdominal exam had mild tenderness no rebound guarding or rigidity.  She is can be sent home with antibiotics, anti-inflammatories and advised to follow-up with her PCP in 2 days for wound recheck and given strict return precautions. I ordered medication including hydrocodone for pain Reevaluation of the patient after these medicines showed that the patient improved I have reviewed the patients home medicines and have made adjustments as needed       Amount and/or Complexity of Data Reviewed Labs: ordered. Radiology: ordered.  Risk Prescription drug management.           Final Clinical Impression(s) / ED Diagnoses Final diagnoses:  Cellulitis of buttock  Nausea and vomiting, unspecified vomiting type  Hypokalemia    Rx / DC Orders ED Discharge Orders          Ordered    doxycycline (VIBRAMYCIN) 100 MG capsule  2 times daily        06/05/22 0251    naproxen (NAPROSYN) 500 MG tablet  2 times daily        06/05/22 0254    metoCLOPramide (REGLAN) 10 MG tablet  Every 6 hours        06/05/22 0256              Ma Rings, PA-C 06/05/22 0304    Tilden Fossa, MD 06/05/22 [phone]

## 2022-06-28 ENCOUNTER — Other Ambulatory Visit: Payer: Self-pay | Admitting: Neurology

## 2022-06-30 ENCOUNTER — Encounter: Payer: Self-pay | Admitting: Neurology

## 2022-06-30 ENCOUNTER — Ambulatory Visit: Payer: Self-pay | Admitting: Neurology

## 2022-07-21 ENCOUNTER — Telehealth: Payer: Self-pay | Admitting: Neurology

## 2022-07-21 ENCOUNTER — Encounter: Payer: Self-pay | Admitting: Neurology

## 2022-07-21 NOTE — Telephone Encounter (Signed)
Sent letter in mail informing pt of appt change from 10/06/22 to 10/13/22 due to provider schedule change

## 2022-08-04 ENCOUNTER — Telehealth (INDEPENDENT_AMBULATORY_CARE_PROVIDER_SITE_OTHER): Payer: Medicaid Other | Admitting: Psychiatry

## 2022-08-04 ENCOUNTER — Encounter (HOSPITAL_COMMUNITY): Payer: Self-pay | Admitting: Psychiatry

## 2022-08-04 DIAGNOSIS — F4010 Social phobia, unspecified: Secondary | ICD-10-CM | POA: Diagnosis not present

## 2022-08-04 DIAGNOSIS — F319 Bipolar disorder, unspecified: Secondary | ICD-10-CM | POA: Diagnosis not present

## 2022-08-04 MED ORDER — DULOXETINE HCL 60 MG PO CPEP: 120.0000 mg | ORAL_CAPSULE | Freq: Every day | ORAL | 2 refills | Status: DC

## 2022-08-04 MED ORDER — TRAZODONE HCL 100 MG PO TABS
100.0000 mg | ORAL_TABLET | Freq: Every evening | ORAL | 3 refills | Status: DC | PRN
Start: 2022-08-04 — End: 2022-11-11

## 2022-08-04 MED ORDER — HALOPERIDOL 5 MG PO TABS: 5.0000 mg | ORAL_TABLET | Freq: Every evening | ORAL | 3 refills | Status: DC

## 2022-08-04 MED ORDER — LUMATEPERONE TOSYLATE 42 MG PO CAPS
42.0000 mg | ORAL_CAPSULE | Freq: Every day | ORAL | 3 refills | Status: DC
Start: 2022-08-04 — End: 2022-11-10

## 2022-08-04 NOTE — Progress Notes (Signed)
BH MD/PA/NP OP Progress Note Virtual Visit via Video Note  I connected with Carmen Daniels on 08/04/22 at  1:00 PM EDT by a video enabled telemedicine application and verified that I am speaking with the correct person using two identifiers.  Location: Patient: Home Provider: Clinic   I discussed the limitations of evaluation and management by telemedicine and the availability of in person appointments. The patient expressed understanding and agreed to proceed.  I provided 30 minutes of non-face-to-face time during this encounter.    08/04/2022 1:30 PM Carmen HEDEEN  MRN:  [id number]  Chief Complaint: "I have a migraine" Mother "I am not sure if she is okay"    HPI: 37 year-old female seen today for follow-up psychiatric evaluation. She has a psychiatric history of bipolar disorder, borderline personality disorder, anxiety, depression, and SI/SA.   She is currently managed on Cymbalta 120 mg daily, Caplyta 42 mg daily, Haldol 2 mg nightly, and trazodone 100 mg nightly as needed.  Patient is also prescribed Ambien 10 mg nightly,  gabapentin 400 mg 4 times daily, and Klonopin 1 mg 4 times daily as needed which are all prescribed by her PCP.  She notes that she was taken off of Ambien and oxycodone. She reports that her medications are somewhat effective in managing her psychiatric conditions.  Today she was well-groomed, pleasant, cooperative, engaged in conversation.  She informed Clinical research associate that she has been having migraines.  She also informed Clinical research associate that she has neuropathy in her feet.  Patient notes that gabapentin and Imitrex are somewhat effective in managing her pain.  Patient notes that she has been more down. She reports that she continues to grieve the loss of her friend and notes that she feels that she is grieving her father who has Alzheimer's. Today provider conducted a GAD-7 and patient scored a 16, at her last visit she scored a 13.  She informed Clinical research associate that she is worried about  her health, her father, and her mother who recently had surgery on her ankle.  Provider also conducted PHQ-9 the patient scored a 12, at her last visit she scored a 6.  She endorsed increased sleep and adequate appetite.  Today she denies SI/HI/AVH, mania, paranoia.  Provider briefly spoke to patient's mother who reports that she is uncertain if she is doing okay.  She notes that she has not been able to visit her daughter as she cannot currently walk.  She reports that her daughter informed her that she is taking her medications.  Patient reports that Haldol has been somewhat effective in managing her mood and depression.  She requested to have it increased.  Provider discussed risk and benefits of being on 2 antipsychotics.  She endorsed understanding.  Today Haldol increased to 5 mg nightly to help manage mood.  She will continue all other medications as prescribed.  No other concerns noted at this time.        Visit Diagnosis:    ICD-10-CM   1. Social anxiety disorder  F40.10 traZODone (DESYREL) 100 MG tablet    2. Bipolar 1 disorder, depressed (HCC)  F31.9 traZODone (DESYREL) 100 MG tablet    haloperidol (HALDOL) 5 MG tablet    lumateperone tosylate (CAPLYTA) 42 MG capsule    DULoxetine (CYMBALTA) 60 MG capsule        Past Psychiatric History: bipolar disorder, borderline personality disorder, anxiety, depression, and SI/SA Past Medical History:  Past Medical History:  Diagnosis Date   Allergic rhinitis  Anxiety    Arthritis    Asthma    Bipolar disorder (HCC)    Borderline personality disorder (HCC)    Bupropion overdose    Chronic pain syndrome    Depression    Family history of adverse reaction to anesthesia    Mom - N/V   Fibromyalgia    generalized   Hallucinations    02/08/22- have cleared mom said.   Migraines    Nasal polyps    OCD (obsessive compulsive disorder)    Ovarian cyst    left   Pneumonia    2021   PONV (postoperative nausea and vomiting)     Schizophrenia (HCC)    schizoaffective   Seizures (HCC)    patient attenped sudide by tak alot of her medication,.    Past Surgical History:  Procedure Laterality Date   ABDOMINAL HYSTERECTOMY     LAPAROSCOPIC TUBAL LIGATION Bilateral 06/13/2019   Procedure: LAPAROSCOPIC TUBAL LIGATION;  Surgeon: Hermina Staggers, MD;  Location: [facility];  Service: Gynecology;  Laterality: Bilateral;  FILSHIE CLIPS   NASAL SINUS SURGERY     ORIF ANKLE FRACTURE Right 02/09/2022   Procedure: OPEN TREATMENT OF RIGHT BIMALLEOLAR ANKLE FRACTURE;  Surgeon: Terance Hart, MD;  Location: [facility];  Service: Orthopedics;  Laterality: Right;   TUBAL LIGATION     tubes in ears     VAGINAL HYSTERECTOMY Bilateral 01/15/2020   Procedure: HYSTERECTOMY VAGINAL;  Surgeon: Hermina Staggers, MD;  Location: [facility];  Service: Gynecology;  Laterality: Bilateral;   WISDOM TOOTH EXTRACTION      Family Psychiatric History: Paternal grandmother depression Family History:  Family History  Problem Relation Age of Onset   Healthy Mother    Healthy Father    Arthritis Maternal Grandmother    Arthritis Maternal Grandfather    Depression Paternal Grandmother    Hyperlipidemia Other    Stroke Other    Hypertension Other    Cancer Other    COPD Other    Asthma Other     Social History:  Social History   Socioeconomic History   Marital status: Married    Spouse name: Not on file   Number of children: 0   Years of education: 14   Highest education level: Not on file  Occupational History   Occupation: Unemployed  Tobacco Use   Smoking status: Every Day    Packs/day: 0.50    Years: 15.00    Additional pack years: 0.00    Total pack years: 7.50    Types: Cigarettes   Smokeless tobacco: Never  Vaping Use   Vaping Use: Every day   Substances: Nicotine  Substance and Sexual Activity   Alcohol use: Never   Drug use: Yes    Comment: prescribed narcotics   Sexual activity: Not on file  Other  Topics Concern   Not on file  Social History Narrative   Lives alone   Right-handed.   3 glasses of green tea per day.   Social Determinants of Health   Financial Resource Strain: Not on file  Food Insecurity: No Food Insecurity (10/29/2020)   Hunger Vital Sign    Worried About Running Out of Food in the Last Year: Never true    Ran Out of Food in the Last Year: Never true  Transportation Needs: Not on file  Physical Activity: Not on file  Stress: Not on file  Social Connections: Not on file    Allergies:  Allergies  Allergen Reactions   Keflex [Cephalexin] Anaphylaxis    Has tolerated amoxicillin 2019/2020    Metabolic Disorder Labs: Lab Results  Component Value Date   HGBA1C 4.7 (L) 02/07/2021   MPG 88.19 02/07/2021   No results found for: "PROLACTIN" Lab Results  Component Value Date   TRIG 341 (H) 04/03/2019   Lab Results  Component Value Date   TSH 0.972 02/15/2021   TSH 4.304 07/23/2020    Therapeutic Level Labs: Lab Results  Component Value Date   LITHIUM 0.4 (L) 04/24/2021   LITHIUM <0.06 (L) 07/30/2019   No results found for: "VALPROATE" No results found for: "CBMZ"  Current Medications: Current Outpatient Medications  Medication Sig Dispense Refill   Atogepant (QULIPTA) 60 MG TABS Take 60 mg by mouth daily. (Patient taking differently: Take 60 mg by mouth daily. Does not have, waiting for [employer] to approved) 30 tablet 11   beclomethasone (QVAR) 40 MCG/ACT inhaler Inhale 1 puff into the lungs 2 (two) times daily as needed (asthma).      BOTOX 200 units injection To be administered by MD. Inject 200 units intramuscularly every 3 months. 1 each 2   botulinum toxin Type A (BOTOX) 100 units SOLR injection Inject 200 Units into the muscle every 3 (three) months. Inject into head and neck muscles 2 each 3   clonazePAM (KLONOPIN) 1 MG tablet Take 1 mg by mouth 4 (four) times daily.     doxycycline (VIBRAMYCIN) 100 MG capsule Take 1 capsule (100 mg total)  by mouth 2 (two) times daily. 20 capsule 0   DULoxetine (CYMBALTA) 60 MG capsule Take 2 capsules (120 mg total) by mouth daily. 60 capsule 2   gabapentin (NEURONTIN) 400 MG capsule Take 400 mg by mouth 4 (four) times daily.     haloperidol (HALDOL) 5 MG tablet Take 1 tablet (5 mg total) by mouth at bedtime. 30 tablet 3   lumateperone tosylate (CAPLYTA) 42 MG capsule Take 1 capsule (42 mg total) by mouth daily. 30 capsule 3   metoCLOPramide (REGLAN) 10 MG tablet Take 1 tablet (10 mg total) by mouth every 6 (six) hours. 30 tablet 0   Multiple Vitamins-Calcium (ONE-A-DAY WOMENS FORMULA PO) Take 1 tablet by mouth daily.     naproxen (NAPROSYN) 500 MG tablet Take 1 tablet (500 mg total) by mouth 2 (two) times daily. 10 tablet 0   oxyCODONE (ROXICODONE) 5 MG immediate release tablet Take 1 tablet (5 mg total) by mouth every 4 (four) hours as needed for severe pain. 30 tablet 0   SUMAtriptan (IMITREX) 100 MG tablet May repeat in 2 hours if headache persists or recurs. Do no refill in less than 30 days 10 tablet 11   traZODone (DESYREL) 100 MG tablet Take 1 tablet (100 mg total) by mouth at bedtime as needed for sleep. 30 tablet 3   Current Facility-Administered Medications  Medication Dose Route Frequency Provider Last Rate Last Admin   botulinum toxin Type A (BOTOX) injection 200 Units  200 Units Intramuscular Once Levert Feinstein, MD       botulinum toxin Type A (BOTOX) injection 200 Units  200 Units Intramuscular Once Levert Feinstein, MD         Musculoskeletal: Strength & Muscle Tone: within normal limits and telehealth visit Gait & Station: normal, telehealth visit Patient leans: N/A  Psychiatric Specialty Exam: Review of Systems  There were no vitals taken for this visit.There is no height or weight on file to calculate BMI.  General Appearance: Well  Groomed  Eye Contact:  Good  Speech:  Clear and Coherent and Normal Rate  Volume:  Normal  Mood:  Anxious and Depressed  Affect:  Congruent   Thought Process:  Coherent, Goal Directed and Linear  Orientation:  Full (Time, Place, and Person)  Thought Content: WDL and Logical   Suicidal Thoughts:  No  Homicidal Thoughts:  No  Memory:  Immediate;   Good Recent;   Good Remote;   Good  Judgement:  Good  Insight:  Good  Psychomotor Activity:  Normal  Concentration:  Concentration: Good and Attention Span: Good  Recall:  Good  Fund of Knowledge: Good  Language: Good  Akathisia:  No  Handed:  Right  AIMS (if indicated):Done  Assets:  Communication Skills Desire for Improvement Financial Resources/Insurance Housing Social Support  ADL's:  Intact  Cognition: WNL  Sleep:  Good   Screenings: AIMS    Flowsheet Row Clinical Support from 11/26/2019 in [facility]  AIMS Total Score 5 0      GAD-7    Flowsheet Row Video Visit from 08/04/2022 in [facility] Office Visit from 05/17/2022 in [facility] Office Visit from 08/26/2021 in [facility] Video Visit from 10/30/2020 in [facility] Video Visit from 08/11/2020 in [facility]  Total GAD-7 Score 16 13 17 20 13       Mini-Mental    Flowsheet Row Office Visit from 10/05/2016 in [facility]  Total Score (max 30 points ) 26      PHQ2-9    Flowsheet Row Video Visit from 08/04/2022 in [facility] Office Visit from 05/17/2022 in [facility] Office Visit from 08/26/2021 in [facility] Nutrition from 10/29/2020 in [facility] Video Visit from 08/11/2020 in [place]  PHQ-2 Total Score 4 1 6 3 4   PHQ-9 Total Score 12 6 19 18 15        Flowsheet Row ED from 06/04/2022 in [facility] Emergency Department at [facility] Admission (Discharged) from 02/09/2022 in [place] [facility] ED from 09/18/2020 in [facility] Emergency Department at [facility]  C-SSRS RISK CATEGORY No Risk Moderate Risk No Risk        Assessment and Plan: Patient endorse anxiety and depression.  She reports she has been having neuropathy in her foot and headaches.  Patient is followed by her PCP for these conditions and are prescribed medications which she reports is somewhat effective.  Patient reports that Haldol has been somewhat effective in managing her mood and depression.  She requested to have it increased.  Provider discussed risk and benefits of being on 2 antipsychotics.  She endorsed understanding.  Today Haldol increased to 5 mg nightly to help manage mood.  She will continue all other medications as prescribed.   1. Social anxiety disorder  Continue- traZODone (DESYREL) 100 MG tablet; Take 1 tablet (100 mg total) by mouth at bedtime as needed for sleep.  Dispense: 30 tablet; Refill: 3  2. Bipolar 1 disorder, depressed (HCC)  Continue- traZODone (DESYREL) 100 MG tablet; Take 1 tablet (100 mg total) by mouth at bedtime as needed for sleep.  Dispense: 30 tablet; Refill: 3 Increased- haloperidol (HALDOL) 5 MG tablet; Take 1 tablet (5  mg total) by mouth at bedtime.  Dispense: 30 tablet; Refill: 3 Continue- lumateperone tosylate (CAPLYTA) 42 MG capsule; Take 1 capsule (42 mg total) by mouth daily.  Dispense: 30 capsule; Refill: 3 Continue- DULoxetine (CYMBALTA) 60 MG capsule; Take 2 capsules (120 mg total) by mouth daily.  Dispense: 60 capsule; Refill: 2     Follow-up in 3 months. Follow-up with outpatient therapy   Shanna Cisco, NP 08/04/2022, 1:30 PM

## 2022-08-23 ENCOUNTER — Telehealth: Payer: Self-pay | Admitting: Neurology

## 2022-08-23 NOTE — Telephone Encounter (Signed)
Sent mychart message requesting info about new insurance coming up in the system.

## 2022-08-25 ENCOUNTER — Ambulatory Visit: Payer: MEDICAID | Admitting: Neurology

## 2022-08-25 VITALS — BP 110/72

## 2022-08-25 DIAGNOSIS — G43719 Chronic migraine without aura, intractable, without status migrainosus: Secondary | ICD-10-CM | POA: Diagnosis not present

## 2022-08-25 MED ORDER — ONABOTULINUMTOXINA 200 UNITS IJ SOLR
155.0000 [IU] | Freq: Once | INTRAMUSCULAR | Status: DC
Start: 2022-08-25 — End: 2022-11-11

## 2022-08-25 NOTE — Progress Notes (Signed)
Botox- 200 units x 1 vial Lot: [id number] Expiration: 04.2026 NDC: 0023-3921-02  Bacteriostatic 0.9% Sodium Chloride- 4 mL  Lot: [id number]  Expiration: 11.01.2025 NDC: [id number]  Dx: G43.719 S/P Witnessed by Cinda Quest, CMA

## 2022-08-25 NOTE — Progress Notes (Signed)
Botox injection for chronic migraine prevention, injection was performed according to [place] protocol,  5 units of Botox was injected into each side, for 31 injection sites, total of 155 units  Bilateral frontalis 4 injection sites Bilateral corrugate 2 injection sites Procerus 1 injection sites. Bilateral temporalis 8 injection sites Bilateral occipitalis 6 injection sites Bilateral cervical paraspinals 4 injection sites Bilateral upper trapezius 6 injection sites  Extra 45 unites were injected into bilateral upper cervical region and masseters    She suffered right ankle fracture after fall in boots, overall bilateral lower extremity strength recovered very well, ambulate with walker.

## 2022-10-06 ENCOUNTER — Ambulatory Visit: Payer: Medicaid Other | Admitting: Neurology

## 2022-10-13 ENCOUNTER — Ambulatory Visit: Payer: Medicaid Other | Admitting: Neurology

## 2022-10-14 ENCOUNTER — Emergency Department (HOSPITAL_COMMUNITY): Payer: MEDICAID

## 2022-10-14 ENCOUNTER — Inpatient Hospital Stay (HOSPITAL_COMMUNITY)
Admission: EM | Admit: 2022-10-14 | Discharge: 2022-11-11 | DRG: 917 | Disposition: A | Discharge status: Rehabilitation Facility | Payer: MEDICAID | Attending: Internal Medicine | Admitting: Internal Medicine

## 2022-10-14 DIAGNOSIS — G929 Unspecified toxic encephalopathy: Secondary | ICD-10-CM | POA: Diagnosis present

## 2022-10-14 DIAGNOSIS — R197 Diarrhea, unspecified: Secondary | ICD-10-CM | POA: Diagnosis not present

## 2022-10-14 DIAGNOSIS — Z993 Dependence on wheelchair: Secondary | ICD-10-CM

## 2022-10-14 DIAGNOSIS — Z818 Family history of other mental and behavioral disorders: Secondary | ICD-10-CM

## 2022-10-14 DIAGNOSIS — Z79899 Other long term (current) drug therapy: Secondary | ICD-10-CM

## 2022-10-14 DIAGNOSIS — F209 Schizophrenia, unspecified: Secondary | ICD-10-CM | POA: Diagnosis present

## 2022-10-14 DIAGNOSIS — Z8249 Family history of ischemic heart disease and other diseases of the circulatory system: Secondary | ICD-10-CM

## 2022-10-14 DIAGNOSIS — Z8261 Family history of arthritis: Secondary | ICD-10-CM

## 2022-10-14 DIAGNOSIS — F419 Anxiety disorder, unspecified: Secondary | ICD-10-CM | POA: Diagnosis present

## 2022-10-14 DIAGNOSIS — G5731 Lesion of lateral popliteal nerve, right lower limb: Secondary | ICD-10-CM | POA: Diagnosis present

## 2022-10-14 DIAGNOSIS — M21371 Foot drop, right foot: Secondary | ICD-10-CM | POA: Diagnosis present

## 2022-10-14 DIAGNOSIS — R55 Syncope and collapse: Secondary | ICD-10-CM

## 2022-10-14 DIAGNOSIS — G928 Other toxic encephalopathy: Secondary | ICD-10-CM | POA: Diagnosis present

## 2022-10-14 DIAGNOSIS — Z90710 Acquired absence of both cervix and uterus: Secondary | ICD-10-CM

## 2022-10-14 DIAGNOSIS — F603 Borderline personality disorder: Secondary | ICD-10-CM | POA: Diagnosis present

## 2022-10-14 DIAGNOSIS — F17210 Nicotine dependence, cigarettes, uncomplicated: Secondary | ICD-10-CM | POA: Diagnosis present

## 2022-10-14 DIAGNOSIS — E876 Hypokalemia: Secondary | ICD-10-CM | POA: Diagnosis not present

## 2022-10-14 DIAGNOSIS — M6282 Rhabdomyolysis: Principal | ICD-10-CM | POA: Diagnosis present

## 2022-10-14 DIAGNOSIS — T50911A Poisoning by multiple unspecified drugs, medicaments and biological substances, accidental (unintentional), initial encounter: Principal | ICD-10-CM | POA: Diagnosis present

## 2022-10-14 DIAGNOSIS — F429 Obsessive-compulsive disorder, unspecified: Secondary | ICD-10-CM | POA: Diagnosis present

## 2022-10-14 DIAGNOSIS — F319 Bipolar disorder, unspecified: Secondary | ICD-10-CM

## 2022-10-14 DIAGNOSIS — F1910 Other psychoactive substance abuse, uncomplicated: Secondary | ICD-10-CM | POA: Diagnosis present

## 2022-10-14 DIAGNOSIS — L03116 Cellulitis of left lower limb: Secondary | ICD-10-CM | POA: Diagnosis not present

## 2022-10-14 DIAGNOSIS — F4381 Prolonged grief disorder: Secondary | ICD-10-CM | POA: Diagnosis present

## 2022-10-14 DIAGNOSIS — F1193 Opioid use, unspecified with withdrawal: Secondary | ICD-10-CM | POA: Diagnosis present

## 2022-10-14 DIAGNOSIS — E860 Dehydration: Secondary | ICD-10-CM | POA: Diagnosis present

## 2022-10-14 DIAGNOSIS — F3189 Other bipolar disorder: Secondary | ICD-10-CM

## 2022-10-14 DIAGNOSIS — E872 Acidosis, unspecified: Secondary | ICD-10-CM | POA: Diagnosis present

## 2022-10-14 DIAGNOSIS — J4530 Mild persistent asthma, uncomplicated: Secondary | ICD-10-CM | POA: Diagnosis present

## 2022-10-14 DIAGNOSIS — Y92009 Unspecified place in unspecified non-institutional (private) residence as the place of occurrence of the external cause: Secondary | ICD-10-CM

## 2022-10-14 DIAGNOSIS — G43909 Migraine, unspecified, not intractable, without status migrainosus: Secondary | ICD-10-CM | POA: Diagnosis present

## 2022-10-14 DIAGNOSIS — M797 Fibromyalgia: Secondary | ICD-10-CM | POA: Diagnosis present

## 2022-10-14 DIAGNOSIS — G894 Chronic pain syndrome: Secondary | ICD-10-CM | POA: Diagnosis present

## 2022-10-14 DIAGNOSIS — Z825 Family history of asthma and other chronic lower respiratory diseases: Secondary | ICD-10-CM

## 2022-10-14 DIAGNOSIS — Z9151 Personal history of suicidal behavior: Secondary | ICD-10-CM

## 2022-10-14 DIAGNOSIS — R4182 Altered mental status, unspecified: Secondary | ICD-10-CM | POA: Diagnosis present

## 2022-10-14 DIAGNOSIS — Z881 Allergy status to other antibiotic agents status: Secondary | ICD-10-CM

## 2022-10-14 DIAGNOSIS — S9491XA Injury of unspecified nerve at ankle and foot level, right leg, initial encounter: Secondary | ICD-10-CM | POA: Diagnosis present

## 2022-10-14 DIAGNOSIS — Z9152 Personal history of nonsuicidal self-harm: Secondary | ICD-10-CM

## 2022-10-14 DIAGNOSIS — F314 Bipolar disorder, current episode depressed, severe, without psychotic features: Secondary | ICD-10-CM | POA: Diagnosis present

## 2022-10-14 LAB — CBC
HCT: 48.8 % — ABNORMAL HIGH (ref 36.0–46.0)
Hemoglobin: 16.8 g/dL — ABNORMAL HIGH (ref 12.0–15.0)
MCH: 28.4 pg (ref 26.0–34.0)
MCHC: 34.4 g/dL (ref 30.0–36.0)
MCV: 82.4 fL (ref 80.0–100.0)
Platelets: 410 10*3/uL — ABNORMAL HIGH (ref 150–400)
RBC: 5.92 MIL/uL — ABNORMAL HIGH (ref 3.87–5.11)
RDW: 12 % (ref 11.5–15.5)
WBC: 24.5 10*3/uL — ABNORMAL HIGH (ref 4.0–10.5)
nRBC: 0 % (ref 0.0–0.2)

## 2022-10-14 LAB — AMMONIA: Ammonia: 13 umol/L (ref 9–35)

## 2022-10-14 LAB — SALICYLATE LEVEL: Salicylate Lvl: <7 mg/dL — ABNORMAL LOW (ref 7.0–30.0)

## 2022-10-14 LAB — I-STAT CHEM 8, ED
BUN: 15 mg/dL (ref 6–20)
Calcium, Ion: 1.12 mmol/L — ABNORMAL LOW (ref 1.15–1.40)
Chloride: 97 mmol/L — ABNORMAL LOW (ref 98–111)
Creatinine, Ser: 0.9 mg/dL (ref 0.44–1.00)
Glucose, Bld: 133 mg/dL — ABNORMAL HIGH (ref 70–99)
HCT: 52 % — ABNORMAL HIGH (ref 36.0–46.0)
Hemoglobin: 17.7 g/dL — ABNORMAL HIGH (ref 12.0–15.0)
Potassium: 4 mmol/L (ref 3.5–5.1)
Sodium: 137 mmol/L (ref 135–145)
TCO2: 29 mmol/L (ref 22–32)

## 2022-10-14 LAB — BASIC METABOLIC PANEL
Anion gap: 14 (ref 5–15)
BUN: 12 mg/dL (ref 6–20)
CO2: 29 mmol/L (ref 22–32)
Calcium: 9.9 mg/dL (ref 8.9–10.3)
Chloride: 95 mmol/L — ABNORMAL LOW (ref 98–111)
Creatinine, Ser: 1.01 mg/dL — ABNORMAL HIGH (ref 0.44–1.00)
GFR, Estimated: >60 mL/min (ref >60)
Glucose, Bld: 134 mg/dL — ABNORMAL HIGH (ref 70–99)
Potassium: 4 mmol/L (ref 3.5–5.1)
Sodium: 138 mmol/L (ref 135–145)

## 2022-10-14 LAB — I-STAT CG4 LACTIC ACID, ED
Lactic Acid, Venous: 2.1 mmol/L (ref 0.5–1.9)
Lactic Acid, Venous: 2.2 mmol/L (ref 0.5–1.9)

## 2022-10-14 LAB — CK: Total CK: 14390 U/L — ABNORMAL HIGH (ref 38–234)

## 2022-10-14 LAB — ACETAMINOPHEN LEVEL: Acetaminophen (Tylenol), Serum: <10 ug/mL — ABNORMAL LOW (ref 10–30)

## 2022-10-14 LAB — CBG MONITORING, ED: Glucose-Capillary: 130 mg/dL — ABNORMAL HIGH (ref 70–99)

## 2022-10-14 LAB — ETHANOL: Alcohol, Ethyl (B): <10 mg/dL (ref <10)

## 2022-10-14 MED ORDER — MORPHINE SULFATE (PF) 4 MG/ML IV SOLN
4.0000 mg | Freq: Once | INTRAVENOUS | Status: AC
  Administered 2022-10-14: 4 mg via INTRAVENOUS
  Filled 2022-10-14: qty 1

## 2022-10-14 MED ORDER — LACTATED RINGERS IV BOLUS
1000.0000 mL | Freq: Once | INTRAVENOUS | Status: AC
  Administered 2022-10-14: 1000 mL via INTRAVENOUS

## 2022-10-14 MED ORDER — LACTATED RINGERS IV BOLUS
1000.0000 mL | Freq: Once | INTRAVENOUS | Status: AC
  Administered 2022-10-15: 1000 mL via INTRAVENOUS

## 2022-10-14 NOTE — ED Triage Notes (Signed)
Patient arrives via ems. Boyfriend states he has not seen patient since lastnight at 2200. Boyfriend found her on the floor with emesis, and pills scattered on the floor. Patient reports getting up to vomit but unsure when she fell. Patient has bruising to the right eye. Extremities where cold to palpation. Patient has redness to the right ankle. On scene patient had periods of apnea. Patient is now responsive and verbal in room. Patient is lethargic and slow to speak. Possible surgery to the right ankle.

## 2022-10-14 NOTE — ED Notes (Addendum)
EDP at bedside  

## 2022-10-14 NOTE — ED Provider Notes (Signed)
[place] EMERGENCY DEPARTMENT AT [facility] Provider Note   CSN: [id number] Arrival date & time: 10/14/22  2008     History  Chief Complaint  Patient presents with   Loss of Consciousness    KALENE ABDO is a 37 y.o. female with past medical history significant for bipolar disorder, borderline personality disorder, substance abuse, previous intentional overdose, who presents with concern for patient not having been seen for nearly 20 hours.  Boyfriend found the patient on the floor with vomit, pills scattered on floor, patient reports that she got up to vomit but felt lightheaded and otherwise does not have any recollection.  She has some bruising to the right eye, and had cold extremities on palpation.  Some episodes of apnea noted at the scene, but patient responsive, verbal although somewhat slow to respond on evaluation in the ED.  She reports that she hurts everywhere, especially endorsing some pain on the right side on which she was laying.  She denies any chest pain, shortness of breath.  Boyfriend in the room reports that she has been using fentanyl recreationally as well as takes a large amount of Klonopin which is prescribed to her, however she has been out of fentanyl for 2 days and not been taking her benzos, concern for acute opioid as well as benzodiazepine withdrawal.  She denies any attempt at intentional overdose this evening, denies any active SI, HI, AVH.   Loss of Consciousness      Home Medications Prior to Admission medications   Medication Sig Start Date End Date Taking? Authorizing Provider  Atogepant (QULIPTA) 60 MG TABS Take 60 mg by mouth daily. Patient taking differently: Take 60 mg by mouth daily. Does not have, waiting for [employer] to approved 01/12/22   Levert Feinstein, MD  beclomethasone (QVAR) 40 MCG/ACT inhaler Inhale 1 puff into the lungs 2 (two) times daily as needed (asthma).     [provider]  BOTOX 200 units injection To be  administered by MD. Inject 200 units intramuscularly every 3 months. 06/28/22   Levert Feinstein, MD  botulinum toxin Type A (BOTOX) 100 units SOLR injection Inject 200 Units into the muscle every 3 (three) months. Inject into head and neck muscles 04/30/19   Levert Feinstein, MD  clonazePAM (KLONOPIN) 1 MG tablet Take 1 mg by mouth 4 (four) times daily. 07/14/19   [provider]  DULoxetine (CYMBALTA) 60 MG capsule Take 2 capsules (120 mg total) by mouth daily. 08/04/22   Shanna Cisco, NP  gabapentin (NEURONTIN) 400 MG capsule Take 400 mg by mouth 4 (four) times daily.    [provider]  haloperidol (HALDOL) 5 MG tablet Take 1 tablet (5 mg total) by mouth at bedtime. 08/04/22   Shanna Cisco, NP  lumateperone tosylate (CAPLYTA) 42 MG capsule Take 1 capsule (42 mg total) by mouth daily. 08/04/22   Shanna Cisco, NP  metoCLOPramide (REGLAN) 10 MG tablet Take 1 tablet (10 mg total) by mouth every 6 (six) hours. 06/05/22   Carmel Sacramento A, PA-C  Multiple Vitamins-Calcium (ONE-A-DAY WOMENS FORMULA PO) Take 1 tablet by mouth daily.    [provider]  naproxen (NAPROSYN) 500 MG tablet Take 1 tablet (500 mg total) by mouth 2 (two) times daily. 06/05/22   Carmel Sacramento A, PA-C  oxyCODONE (ROXICODONE) 5 MG immediate release tablet Take 1 tablet (5 mg total) by mouth every 4 (four) hours as needed for severe pain. 02/09/22   [place], Jesse J,  PA-C  SUMAtriptan (IMITREX) 100 MG tablet May repeat in 2 hours if headache persists or recurs. Do no refill in less than 30 days 01/12/22   Levert Feinstein, MD  tiZANidine (ZANAFLEX) 4 MG capsule Take 4 mg by mouth 3 (three) times daily as needed for muscle spasms.    [provider]  traZODone (DESYREL) 100 MG tablet Take 1 tablet (100 mg total) by mouth at bedtime as needed for sleep. 08/04/22   Shanna Cisco, NP      Allergies    Keflex [cephalexin]    Review of Systems   Review of Systems  Cardiovascular:  Positive for  syncope.  Musculoskeletal:  Positive for arthralgias, back pain and neck pain.  All other systems reviewed and are negative.   Physical Exam Updated Vital Signs BP (!) 113/93 (BP Location: Left Arm)   Pulse (!) 127   Temp 98.3 F (36.8 C)   Resp 19   Ht 5\' 11"  (1.803 m)   Wt 79.3 kg   LMP  (LMP Unknown)   SpO2 99%   BMI 24.38 kg/m  Physical Exam Vitals and nursing note reviewed.  Constitutional:      General: She is not in acute distress.    Appearance: She is ill-appearing.  HENT:     Head: Normocephalic and atraumatic.  Eyes:     General:        Right eye: No discharge.        Left eye: No discharge.  Cardiovascular:     Rate and Rhythm: Normal rate and regular rhythm.     Heart sounds: No murmur heard.    No friction rub. No gallop.  Pulmonary:     Effort: Pulmonary effort is normal.     Breath sounds: Normal breath sounds.  Abdominal:     General: Bowel sounds are normal.     Palpations: Abdomen is soft.  Musculoskeletal:     Comments: Intact strength 5/5 bilateral upper extremities as well as left lower extremity.  Patient has intact sensation of the right lower extremity but will not spontaneously moves on command, however she was able to prevent leg from falling when placed in an upward position  Skin:    General: Skin is dry.     Capillary Refill: Capillary refill takes less than 2 seconds.     Comments: Patient with some area of redness, swelling on bilateral legs and right side of the body, concern for having been on the ground for an extended period of time  Compartments soft in bilateral lower extremities.  She does have some possible developing pressure ulcers on the sacrum, but no open wound at this time, just some skin irritation noted.  Redness noted to right temple, no step-off or deformity noted.  She has some dried blood around the face on initial evaluation  Neurological:     Mental Status: She is alert and oriented to person, place, and time.   Psychiatric:        Mood and Affect: Mood normal.        Behavior: Behavior normal.     ED Results / Procedures / Treatments   Labs (all labs ordered are listed, but only abnormal results are displayed) Labs Reviewed  BASIC METABOLIC PANEL - Abnormal; Notable for the following components:      Result Value   Chloride 95 (*)    Glucose, Bld 134 (*)    Creatinine, Ser 1.01 (*)    All other components  within normal limits  CBC - Abnormal; Notable for the following components:   WBC 24.5 (*)    RBC 5.92 (*)    Hemoglobin 16.8 (*)    HCT 48.8 (*)    Platelets 410 (*)    All other components within normal limits  URINALYSIS, ROUTINE W REFLEX MICROSCOPIC - Abnormal; Notable for the following components:   Color, Urine AMBER (*)    APPearance CLOUDY (*)    Hgb urine dipstick SMALL (*)    Bilirubin Urine MODERATE (*)    Ketones, ur 5 (*)    Protein, ur 100 (*)    Bacteria, UA RARE (*)    All other components within normal limits  CK - Abnormal; Notable for the following components:   Total CK 14,390 (*)    All other components within normal limits  RAPID URINE DRUG SCREEN, HOSP PERFORMED - Abnormal; Notable for the following components:   Opiates POSITIVE (*)    Cocaine POSITIVE (*)    Benzodiazepines POSITIVE (*)    Amphetamines POSITIVE (*)    Tetrahydrocannabinol POSITIVE (*)    All other components within normal limits  ACETAMINOPHEN LEVEL - Abnormal; Notable for the following components:   Acetaminophen (Tylenol), Serum <10 (*)    All other components within normal limits  SALICYLATE LEVEL - Abnormal; Notable for the following components:   Salicylate Lvl <7.0 (*)    All other components within normal limits  CBC - Abnormal; Notable for the following components:   WBC 24.1 (*)    RBC 5.97 (*)    Hemoglobin 16.9 (*)    HCT 49.0 (*)    Platelets 423 (*)    All other components within normal limits  BASIC METABOLIC PANEL - Abnormal; Notable for the following  components:   Glucose, Bld 159 (*)    All other components within normal limits  CK - Abnormal; Notable for the following components:   Total CK 6,144 (*)    All other components within normal limits  CBG MONITORING, ED - Abnormal; Notable for the following components:   Glucose-Capillary 130 (*)    All other components within normal limits  I-STAT CG4 LACTIC ACID, ED - Abnormal; Notable for the following components:   Lactic Acid, Venous 2.1 (*)    All other components within normal limits  I-STAT CHEM 8, ED - Abnormal; Notable for the following components:   Chloride 97 (*)    Glucose, Bld 133 (*)    Calcium, Ion 1.12 (*)    Hemoglobin 17.7 (*)    HCT 52.0 (*)    All other components within normal limits  I-STAT CG4 LACTIC ACID, ED - Abnormal; Notable for the following components:   Lactic Acid, Venous 2.2 (*)    All other components within normal limits  AMMONIA  ETHANOL  HIV ANTIBODY (ROUTINE TESTING W REFLEX)  CBC  COMPREHENSIVE METABOLIC PANEL  CBG MONITORING, ED    EKG EKG Interpretation Date/Time:  Thursday October 14 2022 20:15:37 EDT Ventricular Rate:  97 PR Interval:  118 QRS Duration:  82 QT Interval:  462 QTC Calculation: 587 R Axis:   33  Text Interpretation: Sinus rhythm Borderline short PR interval Right atrial enlargement Abnormal R-wave progression, early transition new Prolonged QT interval Confirmed by Gwyneth Sprout ([id number]) on 10/14/2022 8:37:08 PM  Radiology CT Lumbar Spine Wo Contrast  Result Date: 10/14/2022 CLINICAL DATA:  Unwitnessed fall, found on the floor in her bathroom by boyfriend. Unknown trauma. EXAM: CT LUMBAR SPINE  WITHOUT CONTRAST TECHNIQUE: Multidetector CT imaging of the lumbar spine was performed without intravenous contrast administration. Multiplanar CT image reconstructions were also generated. RADIATION DOSE REDUCTION: This exam was performed according to the departmental dose-optimization program which includes automated  exposure control, adjustment of the mA and/or kV according to patient size and/or use of iterative reconstruction technique. COMPARISON:  CT abdomen and pelvis with contrast and reconstructions 06/05/2022 FINDINGS: Segmentation: Standard. Alignment: Slight dextroscoliosis.  No listhesis. Vertebrae: No acute fracture or focal pathologic process is evident. The sacrum and visualized pelvis are intact. Both SI joints are patent with trace spurring. Paraspinal and other soft tissues: Negative. Disc levels: There is preservation of the normal disc heights all lumbar levels from T12-L1. No herniated discs or other significant soft tissue or bony encroachment on the thecal sac are seen. No lumbar levels demonstrate foraminal stenosis. There is mild facet hypertrophy at L2-3 and L3-4, mild-to-moderate facet joint spurring at L4-5 and L5-S1. IMPRESSION: 1. No evidence of fractures. 2. Slight dextroscoliosis. 3. Nonstenosing facet hypertrophy. Electronically Signed   By: Almira Bar M.D.   On: 10/14/2022 22:18   CT Maxillofacial Wo Contrast  Result Date: 10/14/2022 CLINICAL DATA:  Syncope, facial trauma EXAM: CT MAXILLOFACIAL WITHOUT CONTRAST TECHNIQUE: Multidetector CT imaging of the maxillofacial structures was performed. Multiplanar CT image reconstructions were also generated. RADIATION DOSE REDUCTION: This exam was performed according to the departmental dose-optimization program which includes automated exposure control, adjustment of the mA and/or kV according to patient size and/or use of iterative reconstruction technique. COMPARISON:  None Available. FINDINGS: Osseous: No fracture or mandibular dislocation. No destructive process. Orbits: Negative. No traumatic or inflammatory finding. Sinuses: No acute findings Soft tissues: Negative Limited intracranial: See head CT report IMPRESSION: No facial or orbital fracture. Electronically Signed   By: Charlett Nose M.D.   On: 10/14/2022 22:16   CT Cervical Spine Wo  Contrast  Result Date: 10/14/2022 CLINICAL DATA:  Syncope, found down EXAM: CT CERVICAL SPINE WITHOUT CONTRAST TECHNIQUE: Multidetector CT imaging of the cervical spine was performed without intravenous contrast. Multiplanar CT image reconstructions were also generated. RADIATION DOSE REDUCTION: This exam was performed according to the departmental dose-optimization program which includes automated exposure control, adjustment of the mA and/or kV according to patient size and/or use of iterative reconstruction technique. COMPARISON:  06/29/2020 FINDINGS: Alignment: Normal Skull base and vertebrae: No acute fracture. No primary bone lesion or focal pathologic process. Soft tissues and spinal canal: No prevertebral fluid or swelling. No visible canal hematoma. Disc levels: Degenerative disc disease at C3-4 and C6-7 with disc space narrowing and spurring. Mild bilateral degenerative facet disease. Upper chest: No acute findings. Other: None IMPRESSION: No acute bony abnormality. Electronically Signed   By: Charlett Nose M.D.   On: 10/14/2022 22:09   DG Ankle Complete Right  Result Date: 10/14/2022 CLINICAL DATA:  Shortness of breath, 5. EXAM: RIGHT ANKLE - COMPLETE 3+ VIEW COMPARISON:  None Available. FINDINGS: There is no evidence of fracture, dislocation, or joint effusion. shob, fall, found down fibular sideplate and screws are present which appear uncomplicated. There is lateral soft tissue swelling. IMPRESSION: Lateral soft tissue swelling. No evidence of acute fracture or dislocation. Hardware appears uncomplicated. Electronically Signed   By: Darliss Cheney M.D.   On: 10/14/2022 22:07   CT HEAD WO CONTRAST  Result Date: 10/14/2022 CLINICAL DATA:  Syncope/presyncope, cerebrovascular cause suspected EXAM: CT HEAD WITHOUT CONTRAST TECHNIQUE: Contiguous axial images were obtained from the base of the skull through the vertex  without intravenous contrast. RADIATION DOSE REDUCTION: This exam was performed  according to the departmental dose-optimization program which includes automated exposure control, adjustment of the mA and/or kV according to patient size and/or use of iterative reconstruction technique. COMPARISON:  02/07/2021 FINDINGS: Brain: No acute intracranial abnormality. Specifically, no hemorrhage, hydrocephalus, mass lesion, acute infarction, or significant intracranial injury. Vascular: No hyperdense vessel or unexpected calcification. Skull: No acute calvarial abnormality. Sinuses/Orbits: No acute findings Other: None IMPRESSION: No acute intracranial abnormality. Electronically Signed   By: Charlett Nose M.D.   On: 10/14/2022 22:07   DG Hip Port Unilat W or Wo Pelvis 1 View Left  Result Date: 10/14/2022 CLINICAL DATA:  Shortness of breath, fall, found down EXAM: DG HIP (WITH OR WITHOUT PELVIS) 1V PORT LEFT COMPARISON:  None Available. FINDINGS: Hand overlying the right hip compromises assessment. No definite acute fracture. No dislocation. IMPRESSION: No definite acute fracture or dislocation. Electronically Signed   By: Minerva Fester M.D.   On: 10/14/2022 22:07   DG Chest Portable 1 View  Result Date: 10/14/2022 CLINICAL DATA:  Shortness of breath, fall, found down EXAM: PORTABLE CHEST 1 VIEW COMPARISON:  02/09/2021 FINDINGS: The heart size and mediastinal contours are within normal limits. Both lungs are clear. The visualized skeletal structures are unremarkable. IMPRESSION: No active disease. Electronically Signed   By: Minerva Fester M.D.   On: 10/14/2022 22:05    Procedures Ultrasound ED Peripheral IV (Provider)  Date/Time: 10/14/2022 8:45 PM  Performed by: Olene Floss, PA-C Authorized by: Olene Floss, PA-C   Procedure details:    Indications: poor IV access     Skin Prep: isopropyl alcohol     Location:  Right AC   Angiocath:  18 G   Bedside Ultrasound Guided: Yes     Images: not archived     Patient tolerated procedure without complications: Yes      Dressing applied: Yes       Medications Ordered in ED Medications  enoxaparin (LOVENOX) injection 40 mg (40 mg Subcutaneous Given 10/15/22 1536)  acetaminophen (TYLENOL) tablet 650 mg (650 mg Oral Given 10/15/22 1120)    Or  acetaminophen (TYLENOL) suppository 650 mg ( Rectal See Alternative 10/15/22 1120)  ondansetron (ZOFRAN) tablet 4 mg (4 mg Oral Given 10/15/22 1119)    Or  ondansetron (ZOFRAN) injection 4 mg ( Intravenous See Alternative 10/15/22 1119)  thiamine (VITAMIN B1) tablet 100 mg (100 mg Oral Given 10/15/22 1524)    Or  thiamine (VITAMIN B1) injection 100 mg ( Intravenous See Alternative 10/15/22 1524)  folic acid (FOLVITE) tablet 1 mg (1 mg Oral Given 10/15/22 1524)  multivitamin with minerals tablet 1 tablet (1 tablet Oral Given 10/15/22 1524)  lactated ringers infusion ( Intravenous New Bag/Given 10/15/22 0402)  PHENObarbital (LUMINAL) injection 97.5 mg (97.5 mg Intravenous Given 10/15/22 1529)    Followed by  PHENObarbital (LUMINAL) injection 65 mg (has no administration in time range)    Followed by  PHENObarbital (LUMINAL) injection 32.5 mg (has no administration in time range)  phenol (CHLORASEPTIC) mouth spray 1 spray (has no administration in time range)  lactated ringers bolus 1,000 mL (1,000 mLs Intravenous New Bag/Given 10/14/22 2045)  morphine (PF) 4 MG/ML injection 4 mg (4 mg Intravenous Given 10/14/22 2225)  lactated ringers bolus 1,000 mL (1,000 mLs Intravenous New Bag/Given 10/15/22 0000)  morphine (PF) 4 MG/ML injection 4 mg (4 mg Intravenous Given 10/14/22 2356)    ED Course/ Medical Decision Making/ A&P  Medical Decision Making Amount and/or Complexity of Data Reviewed Labs: ordered. Radiology: ordered.  Risk Prescription drug management. Decision regarding hospitalization.   This patient is a 37 y.o. female who presents to the ED for concern of being found down, poorly responsive, apneic, concern for traumatic injury this  involves an extensive number of treatment options, and is a complaint that carries with it a high risk of complications and morbidity. The emergent differential diagnosis prior to evaluation includes, but is not limited to, intentional or accidental overdose, acute withdrawal from reported substances, rhabdomyolysis, dehydration, acute fracture, dislocation, epidural hematoma, subdural hematoma, skull fracture, subarachnoid hemorrhage, unstable cervical spine fracture, concussion vs other MSK injury . This is not an exhaustive differential.   Past Medical History / Co-morbidities / Social History:  bipolar disorder, borderline personality disorder, substance abuse, previous intentional overdose  Additional history: Chart reviewed. Pertinent results include: Reviewed lab work, imaging from previous emergency department evaluations and hospital admissions  Physical Exam: Physical exam performed. The pertinent findings include: Ill-appearing, slow to respond but with appropriate speech  Lab Tests: I ordered, and personally interpreted labs.  The pertinent results include:  significant leukocytosis, 24.5, hemoglobin and platelets also elevated, suspected acute dehydration, hemoconcentration. Bmp with overall normal kidney function, creatinine 1.01 -- surprising in context of CK at 14,390, lactic acid mildly elevated at 2.1, repeat at 2.2. UA, UDS in process at this time.   Imaging Studies: I ordered imaging studies including plain film chest xray, hip xray, ankle xray, ct head, lumbar spine, cervical spine and maxillofacial. I independently visualized and interpreted imaging which showed no traumatic injury, intrathoracic abnormality. I agree with the radiologist interpretation.   Cardiac Monitoring:  The patient was maintained on a cardiac monitor.  My attending physician Dr. Anitra Lauth viewed and interpreted the cardiac monitored which showed an underlying rhythm of: NSR, new prolonged QT. I agree with  this interpretation.   Medications: I ordered medication including fluid bolus, pain medication for rhabdomyolysis, dehydration.  Pain somewhat improved, patient will require admission for her elevated CK, loss of consciousness until her CK clears, she is able to return to her normal baseline mental status.  Consultations Obtained: I requested consultation with the hospitalist, spoke with Dr. Avie Arenas,  and discussed lab and imaging findings as well as pertinent plan - they recommend: admission for developing rhabdomyolysis   Disposition: After consideration of the diagnostic results and the patients response to treatment, I feel that patient would benefit from admission for rhabdomyolysis, loss of consciousness.   I discussed this case with my attending physician Dr. Anitra Lauth who cosigned this note including patient's presenting symptoms, physical exam, and planned diagnostics and interventions. Attending physician stated agreement with plan or made changes to plan which were implemented.    Final Clinical Impression(s) / ED Diagnoses Final diagnoses:  None    Rx / DC Orders ED Discharge Orders     None         [place] 10/15/22 Nigel Mormon, MD 10/21/22 [phone]

## 2022-10-14 NOTE — ED Provider Notes (Incomplete)
[place] EMERGENCY DEPARTMENT AT [facility] Provider Note   CSN: [id number] Arrival date & time: 10/14/22  2008     History {Add pertinent medical, surgical, social history, OB history to HPI:1} Chief Complaint  Patient presents with  . Loss of Consciousness    Carmen Daniels is a 37 y.o. female with past medical history significant for bipolar disorder, borderline personality disorder, substance abuse, previous intentional overdose, who presents with concern for patient not having been seen for nearly 20 hours.  Boyfriend found the patient on the floor with vomit, pills scattered on floor, patient reports that she got up to vomit but felt lightheaded and otherwise does not have any recollection.  She has some bruising to the right eye, and had cold extremities on palpation.  Some episodes of apnea noted at the scene, but patient responsive, verbal although somewhat slow to respond on evaluation in the ED.  She reports that she hurts everywhere, especially endorsing some pain on the right side on which she was laying.  She denies any chest pain, shortness of breath.  Boyfriend in the room reports that she has been using fentanyl recreationally as well as takes a large amount of Klonopin which is prescribed to her, however she has been out of fentanyl for 2 days and not been taking her benzos, concern for acute opioid as well as benzodiazepine withdrawal.  She denies any attempt at intentional overdose this evening, denies any active SI, HI, AVH.   Loss of Consciousness      Home Medications Prior to Admission medications   Medication Sig Start Date End Date Taking? Authorizing Provider  Atogepant (QULIPTA) 60 MG TABS Take 60 mg by mouth daily. Patient taking differently: Take 60 mg by mouth daily. Does not have, waiting for [employer] to approved 01/12/22   Levert Feinstein, MD  beclomethasone (QVAR) 40 MCG/ACT inhaler Inhale 1 puff into the lungs 2 (two) times daily as needed  (asthma).     [provider]  BOTOX 200 units injection To be administered by MD. Inject 200 units intramuscularly every 3 months. 06/28/22   Levert Feinstein, MD  botulinum toxin Type A (BOTOX) 100 units SOLR injection Inject 200 Units into the muscle every 3 (three) months. Inject into head and neck muscles 04/30/19   Levert Feinstein, MD  clonazePAM (KLONOPIN) 1 MG tablet Take 1 mg by mouth 4 (four) times daily. 07/14/19   [provider]  doxycycline (VIBRAMYCIN) 100 MG capsule Take 1 capsule (100 mg total) by mouth 2 (two) times daily. 06/05/22   Carmel Sacramento A, PA-C  DULoxetine (CYMBALTA) 60 MG capsule Take 2 capsules (120 mg total) by mouth daily. 08/04/22   Shanna Cisco, NP  gabapentin (NEURONTIN) 400 MG capsule Take 400 mg by mouth 4 (four) times daily.    [provider]  haloperidol (HALDOL) 5 MG tablet Take 1 tablet (5 mg total) by mouth at bedtime. 08/04/22   Shanna Cisco, NP  lumateperone tosylate (CAPLYTA) 42 MG capsule Take 1 capsule (42 mg total) by mouth daily. 08/04/22   Shanna Cisco, NP  metoCLOPramide (REGLAN) 10 MG tablet Take 1 tablet (10 mg total) by mouth every 6 (six) hours. 06/05/22   Carmel Sacramento A, PA-C  Multiple Vitamins-Calcium (ONE-A-DAY WOMENS FORMULA PO) Take 1 tablet by mouth daily.    [provider]  naproxen (NAPROSYN) 500 MG tablet Take 1 tablet (500 mg total) by mouth 2 (two) times daily. 06/05/22   Cristi Loron,  Celeste A, PA-C  oxyCODONE (ROXICODONE) 5 MG immediate release tablet Take 1 tablet (5 mg total) by mouth every 4 (four) hours as needed for severe pain. 02/09/22   [place], Jesse J, PA-C  SUMAtriptan (IMITREX) 100 MG tablet May repeat in 2 hours if headache persists or recurs. Do no refill in less than 30 days 01/12/22   Levert Feinstein, MD  traZODone (DESYREL) 100 MG tablet Take 1 tablet (100 mg total) by mouth at bedtime as needed for sleep. 08/04/22   Shanna Cisco, NP      Allergies    Keflex [cephalexin]     Review of Systems   Review of Systems  Cardiovascular:  Positive for syncope.  Musculoskeletal:  Positive for arthralgias, back pain and neck pain.  All other systems reviewed and are negative.   Physical Exam Updated Vital Signs BP 90/69 (BP Location: Right Arm)   Pulse 83   Temp 97.8 F (36.6 C) (Oral)   Resp 19   LMP  (LMP Unknown)   SpO2 100%  Physical Exam Vitals and nursing note reviewed.  Constitutional:      General: She is not in acute distress.    Appearance: She is ill-appearing.  HENT:     Head: Normocephalic and atraumatic.  Eyes:     General:        Right eye: No discharge.        Left eye: No discharge.  Cardiovascular:     Rate and Rhythm: Normal rate and regular rhythm.     Heart sounds: No murmur heard.    No friction rub. No gallop.  Pulmonary:     Effort: Pulmonary effort is normal.     Breath sounds: Normal breath sounds.  Abdominal:     General: Bowel sounds are normal.     Palpations: Abdomen is soft.  Musculoskeletal:     Comments: Intact strength 5/5 bilateral upper extremities as well as left lower extremity.  Patient has intact sensation of the right lower extremity but will not spontaneously moves on command, however she was able to prevent leg from falling when placed in an upward position  Skin:    General: Skin is dry.     Capillary Refill: Capillary refill takes less than 2 seconds.     Comments: Patient with some area of redness, swelling on bilateral legs and right side of the body, concern for having been on the ground for an extended period of time  Compartments soft in bilateral lower extremities.  She does have some possible developing pressure ulcers on the sacrum, but no open wound at this time, just some skin irritation noted.  Redness noted to right temple, no step-off or deformity noted.  She has some dried blood around the face on initial evaluation  Neurological:     Mental Status: She is alert and oriented to person,  place, and time.  Psychiatric:        Mood and Affect: Mood normal.        Behavior: Behavior normal.     ED Results / Procedures / Treatments   Labs (all labs ordered are listed, but only abnormal results are displayed) Labs Reviewed  CBG MONITORING, ED - Abnormal; Notable for the following components:      Result Value   Glucose-Capillary 130 (*)    All other components within normal limits  BASIC METABOLIC PANEL  CBC  URINALYSIS, ROUTINE W REFLEX MICROSCOPIC  CK  CBG MONITORING, ED  EKG EKG Interpretation Date/Time:  Thursday October 14 2022 20:15:37 EDT Ventricular Rate:  97 PR Interval:  118 QRS Duration:  82 QT Interval:  462 QTC Calculation: 587 R Axis:   33  Text Interpretation: Sinus rhythm Borderline short PR interval Right atrial enlargement Abnormal R-wave progression, early transition new Prolonged QT interval Confirmed by Gwyneth Sprout ([id number]) on 10/14/2022 8:37:08 PM  Radiology No results found.  Procedures Ultrasound ED Peripheral IV (Provider)  Date/Time: 10/14/2022 8:45 PM  Performed by: Olene Floss, PA-C Authorized by: Olene Floss, PA-C   Procedure details:    Indications: poor IV access     Skin Prep: isopropyl alcohol     Location:  Right AC   Angiocath:  18 G   Bedside Ultrasound Guided: Yes     Images: not archived     Patient tolerated procedure without complications: Yes     Dressing applied: Yes     {Document cardiac monitor, telemetry assessment procedure when appropriate:1}  Medications Ordered in ED Medications  lactated ringers bolus 1,000 mL (has no administration in time range)    ED Course/ Medical Decision Making/ A&P   {   Click here for ABCD2, HEART and other calculatorsREFRESH Note before signing :1}                              Medical Decision Making Amount and/or Complexity of Data Reviewed Labs: ordered. Radiology: ordered.  Risk Prescription drug management.   This patient is a  37 y.o. female who presents to the ED for concern of being found down, poorly responsive, apneic, concern for traumatic injury this involves an extensive number of treatment options, and is a complaint that carries with it a high risk of complications and morbidity. The emergent differential diagnosis prior to evaluation includes, but is not limited to, intentional or accidental overdose, acute withdrawal from reported substances, rhabdomyolysis, dehydration, acute fracture, dislocation, epidural hematoma, subdural hematoma, skull fracture, subarachnoid hemorrhage, unstable cervical spine fracture, concussion vs other MSK injury . This is not an exhaustive differential.   Past Medical History / Co-morbidities / Social History:  bipolar disorder, borderline personality disorder, substance abuse, previous intentional overdose  Additional history: Chart reviewed. Pertinent results include: Reviewed lab work, imaging from previous emergency department evaluations and hospital admissions  Physical Exam: Physical exam performed. The pertinent findings include: Ill-appearing, slow to respond but with appropriate speech  Lab Tests: I ordered, and personally interpreted labs.  The pertinent results include:  significant leukocytosis, 24.5, hemoglobin and platelets also elevated, suspected acute dehydration, hemoconcentration. Bmp with overall normal kidney function, creatinine 1.01 -- surprising in context of CK at 14,390, lactic acid mildly elevated at 2.1, repeat at 2.2. UA, UDS in process at this time.   Imaging Studies: I ordered imaging studies including plain film chest xray, hip xray, ankle xray, ct head, lumbar spine, cervical spine and maxillofacial. I independently visualized and interpreted imaging which showed no traumatic injury, intrathoracic abnormality. I agree with the radiologist interpretation.   Cardiac Monitoring:  The patient was maintained on a cardiac monitor.  My attending physician  Dr. Marland Kitchen viewed and interpreted the cardiac monitored which showed an underlying rhythm of: ***. I agree with this interpretation.   Medications: I ordered medication including ***  for ***. Reevaluation of the patient after these medicines showed that the patient {resolved/improved/worsened:23923::"improved"}. I have reviewed the patients home medicines and have made adjustments as needed.  Consultations Obtained: I requested consultation with the hospitalist, spoke with Dr. Avie Arenas,  and discussed lab and imaging findings as well as pertinent plan - they recommend: admission for developing rhabdomyolysis   Disposition: After consideration of the diagnostic results and the patients response to treatment, I feel that *** .   ***emergency department workup does not suggest an emergent condition requiring admission or immediate intervention beyond what has been performed at this time. The plan is: ***. The patient is safe for discharge and has been instructed to return immediately for worsening symptoms, change in symptoms or any other concerns.  I discussed this case with my attending physician Dr. Marland Kitchen who cosigned this note including patient's presenting symptoms, physical exam, and planned diagnostics and interventions. Attending physician stated agreement with plan or made changes to plan which were implemented.    Final Clinical Impression(s) / ED Diagnoses Final diagnoses:  None    Rx / DC Orders ED Discharge Orders     None

## 2022-10-15 ENCOUNTER — Other Ambulatory Visit: Payer: Self-pay

## 2022-10-15 ENCOUNTER — Encounter (HOSPITAL_COMMUNITY): Payer: Self-pay | Admitting: Internal Medicine

## 2022-10-15 DIAGNOSIS — F429 Obsessive-compulsive disorder, unspecified: Secondary | ICD-10-CM | POA: Diagnosis present

## 2022-10-15 DIAGNOSIS — L089 Local infection of the skin and subcutaneous tissue, unspecified: Secondary | ICD-10-CM | POA: Diagnosis not present

## 2022-10-15 DIAGNOSIS — L03116 Cellulitis of left lower limb: Secondary | ICD-10-CM | POA: Diagnosis not present

## 2022-10-15 DIAGNOSIS — F3189 Other bipolar disorder: Secondary | ICD-10-CM | POA: Diagnosis not present

## 2022-10-15 DIAGNOSIS — R609 Edema, unspecified: Secondary | ICD-10-CM | POA: Diagnosis not present

## 2022-10-15 DIAGNOSIS — F17210 Nicotine dependence, cigarettes, uncomplicated: Secondary | ICD-10-CM | POA: Diagnosis present

## 2022-10-15 DIAGNOSIS — F603 Borderline personality disorder: Secondary | ICD-10-CM | POA: Diagnosis present

## 2022-10-15 DIAGNOSIS — F209 Schizophrenia, unspecified: Secondary | ICD-10-CM | POA: Diagnosis present

## 2022-10-15 DIAGNOSIS — T796XXD Traumatic ischemia of muscle, subsequent encounter: Secondary | ICD-10-CM | POA: Diagnosis not present

## 2022-10-15 DIAGNOSIS — M6282 Rhabdomyolysis: Secondary | ICD-10-CM | POA: Diagnosis present

## 2022-10-15 DIAGNOSIS — R000 Tachycardia, unspecified: Secondary | ICD-10-CM | POA: Diagnosis not present

## 2022-10-15 DIAGNOSIS — R400 Somnolence: Secondary | ICD-10-CM | POA: Diagnosis not present

## 2022-10-15 DIAGNOSIS — F419 Anxiety disorder, unspecified: Secondary | ICD-10-CM | POA: Diagnosis present

## 2022-10-15 DIAGNOSIS — Z8249 Family history of ischemic heart disease and other diseases of the circulatory system: Secondary | ICD-10-CM | POA: Diagnosis not present

## 2022-10-15 DIAGNOSIS — Y92009 Unspecified place in unspecified non-institutional (private) residence as the place of occurrence of the external cause: Secondary | ICD-10-CM | POA: Diagnosis not present

## 2022-10-15 DIAGNOSIS — J4530 Mild persistent asthma, uncomplicated: Secondary | ICD-10-CM | POA: Diagnosis present

## 2022-10-15 DIAGNOSIS — T50904A Poisoning by unspecified drugs, medicaments and biological substances, undetermined, initial encounter: Secondary | ICD-10-CM | POA: Diagnosis not present

## 2022-10-15 DIAGNOSIS — F332 Major depressive disorder, recurrent severe without psychotic features: Secondary | ICD-10-CM | POA: Diagnosis not present

## 2022-10-15 DIAGNOSIS — E872 Acidosis, unspecified: Secondary | ICD-10-CM | POA: Diagnosis present

## 2022-10-15 DIAGNOSIS — G43909 Migraine, unspecified, not intractable, without status migrainosus: Secondary | ICD-10-CM | POA: Diagnosis present

## 2022-10-15 DIAGNOSIS — G929 Unspecified toxic encephalopathy: Secondary | ICD-10-CM | POA: Diagnosis present

## 2022-10-15 DIAGNOSIS — F319 Bipolar disorder, unspecified: Secondary | ICD-10-CM | POA: Diagnosis not present

## 2022-10-15 DIAGNOSIS — M797 Fibromyalgia: Secondary | ICD-10-CM | POA: Diagnosis present

## 2022-10-15 DIAGNOSIS — F1910 Other psychoactive substance abuse, uncomplicated: Secondary | ICD-10-CM | POA: Diagnosis not present

## 2022-10-15 DIAGNOSIS — T148XXA Other injury of unspecified body region, initial encounter: Secondary | ICD-10-CM | POA: Diagnosis not present

## 2022-10-15 DIAGNOSIS — E876 Hypokalemia: Secondary | ICD-10-CM | POA: Diagnosis not present

## 2022-10-15 DIAGNOSIS — F1193 Opioid use, unspecified with withdrawal: Secondary | ICD-10-CM | POA: Diagnosis present

## 2022-10-15 DIAGNOSIS — F314 Bipolar disorder, current episode depressed, severe, without psychotic features: Secondary | ICD-10-CM | POA: Diagnosis present

## 2022-10-15 DIAGNOSIS — E860 Dehydration: Secondary | ICD-10-CM | POA: Diagnosis present

## 2022-10-15 DIAGNOSIS — M21371 Foot drop, right foot: Secondary | ICD-10-CM | POA: Diagnosis present

## 2022-10-15 DIAGNOSIS — R202 Paresthesia of skin: Secondary | ICD-10-CM | POA: Diagnosis not present

## 2022-10-15 DIAGNOSIS — S9491XA Injury of unspecified nerve at ankle and foot level, right leg, initial encounter: Secondary | ICD-10-CM | POA: Diagnosis present

## 2022-10-15 DIAGNOSIS — T50914A Poisoning by multiple unspecified drugs, medicaments and biological substances, undetermined, initial encounter: Secondary | ICD-10-CM | POA: Diagnosis not present

## 2022-10-15 DIAGNOSIS — T50911A Poisoning by multiple unspecified drugs, medicaments and biological substances, accidental (unintentional), initial encounter: Secondary | ICD-10-CM | POA: Diagnosis present

## 2022-10-15 DIAGNOSIS — R4182 Altered mental status, unspecified: Secondary | ICD-10-CM | POA: Diagnosis not present

## 2022-10-15 DIAGNOSIS — G894 Chronic pain syndrome: Secondary | ICD-10-CM | POA: Diagnosis present

## 2022-10-15 DIAGNOSIS — G928 Other toxic encephalopathy: Secondary | ICD-10-CM | POA: Diagnosis present

## 2022-10-15 DIAGNOSIS — R197 Diarrhea, unspecified: Secondary | ICD-10-CM | POA: Diagnosis not present

## 2022-10-15 DIAGNOSIS — R531 Weakness: Secondary | ICD-10-CM | POA: Diagnosis present

## 2022-10-15 LAB — URINALYSIS, ROUTINE W REFLEX MICROSCOPIC
Glucose, UA: NEGATIVE mg/dL
Ketones, ur: 5 mg/dL — AB
Leukocytes,Ua: NEGATIVE
Nitrite: NEGATIVE
Protein, ur: 100 mg/dL — AB
Specific Gravity, Urine: 1.03 (ref 1.005–1.030)
pH: 5 (ref 5.0–8.0)

## 2022-10-15 LAB — BASIC METABOLIC PANEL
Anion gap: 11 (ref 5–15)
BUN: 12 mg/dL (ref 6–20)
CO2: 26 mmol/L (ref 22–32)
Calcium: 9 mg/dL (ref 8.9–10.3)
Chloride: 98 mmol/L (ref 98–111)
Creatinine, Ser: 0.85 mg/dL (ref 0.44–1.00)
GFR, Estimated: >60 mL/min (ref >60)
Glucose, Bld: 159 mg/dL — ABNORMAL HIGH (ref 70–99)
Potassium: 4 mmol/L (ref 3.5–5.1)
Sodium: 135 mmol/L (ref 135–145)

## 2022-10-15 LAB — CBC
HCT: 49 % — ABNORMAL HIGH (ref 36.0–46.0)
Hemoglobin: 16.9 g/dL — ABNORMAL HIGH (ref 12.0–15.0)
MCH: 28.3 pg (ref 26.0–34.0)
MCHC: 34.5 g/dL (ref 30.0–36.0)
MCV: 82.1 fL (ref 80.0–100.0)
Platelets: 423 10*3/uL — ABNORMAL HIGH (ref 150–400)
RBC: 5.97 MIL/uL — ABNORMAL HIGH (ref 3.87–5.11)
RDW: 12.4 % (ref 11.5–15.5)
WBC: 24.1 10*3/uL — ABNORMAL HIGH (ref 4.0–10.5)
nRBC: 0 % (ref 0.0–0.2)

## 2022-10-15 LAB — RAPID URINE DRUG SCREEN, HOSP PERFORMED
Amphetamines: POSITIVE — AB
Barbiturates: NOT DETECTED
Benzodiazepines: POSITIVE — AB
Cocaine: POSITIVE — AB
Opiates: POSITIVE — AB
Tetrahydrocannabinol: POSITIVE — AB

## 2022-10-15 LAB — CK: Total CK: 6144 U/L — ABNORMAL HIGH (ref 38–234)

## 2022-10-15 LAB — HIV ANTIBODY (ROUTINE TESTING W REFLEX): HIV Screen 4th Generation wRfx: NONREACTIVE

## 2022-10-15 MED ORDER — PHENOBARBITAL SODIUM 65 MG/ML IJ SOLN
32.5000 mg | Freq: Three times a day (TID) | INTRAMUSCULAR | Status: AC
  Administered 2022-10-19 – 2022-10-20 (×6): 32.5 mg via INTRAVENOUS
  Filled 2022-10-15 (×6): qty 1

## 2022-10-15 MED ORDER — THIAMINE HCL 100 MG/ML IJ SOLN: 100.0000 mg | Freq: Every day | INTRAMUSCULAR | Status: DC

## 2022-10-15 MED ORDER — PHENOL 1.4 % MT LIQD: 1.0000 | OROMUCOSAL | Status: DC | PRN

## 2022-10-15 MED ORDER — PHENOBARBITAL SODIUM 130 MG/ML IJ SOLN: 130.0000 mg | Freq: Three times a day (TID) | INTRAMUSCULAR | Status: DC

## 2022-10-15 MED ORDER — ENOXAPARIN SODIUM 40 MG/0.4ML IJ SOSY
40.0000 mg | PREFILLED_SYRINGE | INTRAMUSCULAR | Status: DC
  Administered 2022-10-15 – 2022-10-22 (×8): 40 mg via SUBCUTANEOUS
  Filled 2022-10-15 (×8): qty 0.4

## 2022-10-15 MED ORDER — PHENOBARBITAL SODIUM 130 MG/ML IJ SOLN
97.5000 mg | Freq: Three times a day (TID) | INTRAMUSCULAR | Status: AC
  Administered 2022-10-15 – 2022-10-16 (×6): 97.5 mg via INTRAVENOUS
  Filled 2022-10-15 (×6): qty 1

## 2022-10-15 MED ORDER — PHENOBARBITAL SODIUM 130 MG/ML IJ SOLN
65.0000 mg | Freq: Three times a day (TID) | INTRAMUSCULAR | Status: AC
  Administered 2022-10-17 – 2022-10-18 (×6): 65 mg via INTRAVENOUS
  Filled 2022-10-15 (×7): qty 1

## 2022-10-15 MED ORDER — ONDANSETRON HCL 4 MG PO TABS
4.0000 mg | ORAL_TABLET | Freq: Four times a day (QID) | ORAL | Status: DC | PRN
  Administered 2022-10-15 – 2022-10-18 (×2): 4 mg via ORAL
  Filled 2022-10-15 (×2): qty 1

## 2022-10-15 MED ORDER — ACETAMINOPHEN 325 MG PO TABS
650.0000 mg | ORAL_TABLET | Freq: Four times a day (QID) | ORAL | Status: DC | PRN
  Administered 2022-10-15 – 2022-11-08 (×10): 650 mg via ORAL
  Filled 2022-10-15 (×10): qty 2

## 2022-10-15 MED ORDER — THIAMINE MONONITRATE 100 MG PO TABS
100.0000 mg | ORAL_TABLET | Freq: Every day | ORAL | Status: DC
  Administered 2022-10-15 – 2022-10-23 (×9): 100 mg via ORAL
  Filled 2022-10-15 (×9): qty 1

## 2022-10-15 MED ORDER — ADULT MULTIVITAMIN W/MINERALS CH
1.0000 | ORAL_TABLET | Freq: Every day | ORAL | Status: DC
  Administered 2022-10-15 – 2022-10-23 (×9): 1 via ORAL
  Filled 2022-10-15 (×9): qty 1

## 2022-10-15 MED ORDER — ACETAMINOPHEN 650 MG RE SUPP: 650.0000 mg | Freq: Four times a day (QID) | RECTAL | Status: DC | PRN

## 2022-10-15 MED ORDER — LACTATED RINGERS IV SOLN: INTRAVENOUS | Status: DC

## 2022-10-15 MED ORDER — ONDANSETRON HCL 4 MG/2ML IJ SOLN
4.0000 mg | Freq: Four times a day (QID) | INTRAMUSCULAR | Status: DC | PRN
  Administered 2022-10-17 – 2022-10-18 (×3): 4 mg via INTRAVENOUS
  Filled 2022-10-15 (×3): qty 2

## 2022-10-15 MED ORDER — FOLIC ACID 1 MG PO TABS
1.0000 mg | ORAL_TABLET | Freq: Every day | ORAL | Status: DC
  Administered 2022-10-15 – 2022-10-23 (×9): 1 mg via ORAL
  Filled 2022-10-15 (×9): qty 1

## 2022-10-15 NOTE — Progress Notes (Signed)
PROGRESS NOTE    Carmen SOLANO  Daniels:[id number] DOB: 10-08-1985 DOA: 10/14/2022 PCP: Salli Real, MD   Brief Narrative:  Carmen Daniels is a 37 y.o. female with medical history significant of bipolar, substance abuse disorder, prior overdose and questionable suicidal ideation at outside facility.  She presents to the ED after being found down by family.  Nearby there was noted to vomit and multiple medications.  Estimated downtime approximately 20 hours.  Given atypical labs, concern for overdose and subsequent withdrawal hospitalist was called for admission.  Psychiatry was called in consult.  Assessment & Plan:   Principal Problem:   Rhabdomyolysis Active Problems:   Bipolar disorder (HCC)   AMS (altered mental status)   Non-traumatic rhabdomyolysis   Encephalopathy, toxic  Acute toxic encephalopathy, POA At risk for polysubstance abuse Concern for overdose, incidental versus intentional -Multidrug overdose given UDS pan positive other than barbiturates -Unclear what patient has access to, attempting to get list of her home medications -Continue to follow for detox, mental status improving with supportive care -Psychiatry consulted for further evaluation, given patient's overdose, history of suicidal ideation and noted recent history of close friend's death she has agreed to voluntary commitment and inpatient psych once medically cleared.  Rhabdomyolysis, nontraumatic -CK >14k at intake  -Continue aggressive IV fluids, downtrending appropriately -Creatinine currently within normal limits, follow urine output, high risk for renal failure -Imaging and intake without overt fractures or findings, c-collar can be removed  High risk for withdrawals  -Given patient's UDS panel positive for benzos as well as opiates will follow for possible withdrawals. -Avoid any sedating medications in the interim. -Phenobarbital taper initiated at intake  Lactic acidosis -Likely secondary to above  complicated by rhabdomyolysis, dehydration, polysubstance abuse and overdose  DVT prophylaxis: enoxaparin (LOVENOX) injection 40 mg Start: 10/15/22 1000 SCDs Start: 10/15/22 0303   Code Status:   Code Status: Full Code  Family Communication: None present  Status is: Inpatient  Dispo: The patient is from: Home              Anticipated d/c is to: Inpatient psych              Anticipated d/c date is: 48 to 72 hours              Patient currently not medically stable for discharge  Consultants:  Psychiatry  Procedures:  None  Antimicrobials:  None indicated  Subjective: No acute issues or events overnight, patient remains somnolent but arousable answers questions appropriately this morning  Objective: Vitals:   10/15/22 0439 10/15/22 0559 10/15/22 0634 10/15/22 0829  BP: 122/85 (!) 124/94 128/80 (!) 119/58  Pulse: (!) 127 (!) 129  (!) 127  Resp:    20  Temp: (!) 97.4 F (36.3 C) 97.7 F (36.5 C) 98.3 F (36.8 C) 98.3 F (36.8 C)  TempSrc: Axillary Oral Axillary   SpO2: 96% 99% 98% 98%  Weight:      Height:        Intake/Output Summary (Last 24 hours) at 10/15/2022 1433 Last data filed at 10/15/2022 0230 Gross per 24 hour  Intake --  Output 30 ml  Net -30 ml   Filed Weights   10/15/22 0223  Weight: 79.3 kg    Examination:  General: Pleasantly resting in bed, No acute distress.  Somnolent but easily arousable. HEENT: Normocephalic atraumatic.  Sclerae nonicteric, noninjected.  Extraocular movements intact bilaterally. Neck: Without mass or deformity.  Trachea is midline. Lungs: Clear to auscultate bilaterally  without rhonchi, wheeze, or rales. Heart: Regular rate and rhythm.  Without murmurs, rubs, or gallops. Abdomen: Soft, nontender, nondistended.  Without guarding or rebound. Extremities: Without cyanosis, clubbing, edema, or obvious deformity. Skin: Multiple ecchymoses, wounds of various stages of healing  Data Reviewed: I have personally reviewed  following labs and imaging studies  CBC: Recent Labs  Lab 10/14/22 2030 10/14/22 2045 10/15/22 0655  WBC 24.5*  --  24.1*  HGB 16.8* 17.7* 16.9*  HCT 48.8* 52.0* 49.0*  MCV 82.4  --  82.1  PLT 410*  --  423*   Basic Metabolic Panel: Recent Labs  Lab 10/14/22 2030 10/14/22 2045 10/15/22 0655  NA 138 137 135  K 4.0 4.0 4.0  CL 95* 97* 98  CO2 29  --  26  GLUCOSE 134* 133* 159*  BUN 12 15 12   CREATININE 1.01* 0.90 0.85  CALCIUM 9.9  --  9.0   GFR: Estimated Creatinine Clearance: 100.3 mL/min (by C-G formula based on SCr of 0.85 mg/dL). Liver Function Tests: No results for input(s): "AST", "ALT", "ALKPHOS", "BILITOT", "PROT", "ALBUMIN" in the last 168 hours. No results for input(s): "LIPASE", "AMYLASE" in the last 168 hours. Recent Labs  Lab 10/14/22 2030  AMMONIA 13   Coagulation Profile: No results for input(s): "INR", "PROTIME" in the last 168 hours. Cardiac Enzymes: Recent Labs  Lab 10/14/22 2030 10/15/22 0655  CKTOTAL 14,390* 6,144*   BNP (last 3 results) No results for input(s): "PROBNP" in the last 8760 hours. HbA1C: No results for input(s): "HGBA1C" in the last 72 hours. CBG: Recent Labs  Lab 10/14/22 2018  GLUCAP 130*   Lipid Profile: No results for input(s): "CHOL", "HDL", "LDLCALC", "TRIG", "CHOLHDL", "LDLDIRECT" in the last 72 hours. Thyroid Function Tests: No results for input(s): "TSH", "T4TOTAL", "FREET4", "T3FREE", "THYROIDAB" in the last 72 hours. Anemia Panel: No results for input(s): "VITAMINB12", "FOLATE", "FERRITIN", "TIBC", "IRON", "RETICCTPCT" in the last 72 hours. Sepsis Labs: Recent Labs  Lab 10/14/22 2041 10/14/22 2250  LATICACIDVEN 2.1* 2.2*    No results found for this or any previous visit (from the past 240 hour(s)).       Radiology Studies: CT Lumbar Spine Wo Contrast  Result Date: 10/14/2022 CLINICAL DATA:  Unwitnessed fall, found on the floor in her bathroom by boyfriend. Unknown trauma. EXAM: CT LUMBAR  SPINE WITHOUT CONTRAST TECHNIQUE: Multidetector CT imaging of the lumbar spine was performed without intravenous contrast administration. Multiplanar CT image reconstructions were also generated. RADIATION DOSE REDUCTION: This exam was performed according to the departmental dose-optimization program which includes automated exposure control, adjustment of the mA and/or kV according to patient size and/or use of iterative reconstruction technique. COMPARISON:  CT abdomen and pelvis with contrast and reconstructions 06/05/2022 FINDINGS: Segmentation: Standard. Alignment: Slight dextroscoliosis.  No listhesis. Vertebrae: No acute fracture or focal pathologic process is evident. The sacrum and visualized pelvis are intact. Both SI joints are patent with trace spurring. Paraspinal and other soft tissues: Negative. Disc levels: There is preservation of the normal disc heights all lumbar levels from T12-L1. No herniated discs or other significant soft tissue or bony encroachment on the thecal sac are seen. No lumbar levels demonstrate foraminal stenosis. There is mild facet hypertrophy at L2-3 and L3-4, mild-to-moderate facet joint spurring at L4-5 and L5-S1. IMPRESSION: 1. No evidence of fractures. 2. Slight dextroscoliosis. 3. Nonstenosing facet hypertrophy. Electronically Signed   By: Almira Bar M.D.   On: 10/14/2022 22:18   CT Maxillofacial Wo Contrast  Result Date: 10/14/2022  CLINICAL DATA:  Syncope, facial trauma EXAM: CT MAXILLOFACIAL WITHOUT CONTRAST TECHNIQUE: Multidetector CT imaging of the maxillofacial structures was performed. Multiplanar CT image reconstructions were also generated. RADIATION DOSE REDUCTION: This exam was performed according to the departmental dose-optimization program which includes automated exposure control, adjustment of the mA and/or kV according to patient size and/or use of iterative reconstruction technique. COMPARISON:  None Available. FINDINGS: Osseous: No fracture or  mandibular dislocation. No destructive process. Orbits: Negative. No traumatic or inflammatory finding. Sinuses: No acute findings Soft tissues: Negative Limited intracranial: See head CT report IMPRESSION: No facial or orbital fracture. Electronically Signed   By: Charlett Nose M.D.   On: 10/14/2022 22:16   CT Cervical Spine Wo Contrast  Result Date: 10/14/2022 CLINICAL DATA:  Syncope, found down EXAM: CT CERVICAL SPINE WITHOUT CONTRAST TECHNIQUE: Multidetector CT imaging of the cervical spine was performed without intravenous contrast. Multiplanar CT image reconstructions were also generated. RADIATION DOSE REDUCTION: This exam was performed according to the departmental dose-optimization program which includes automated exposure control, adjustment of the mA and/or kV according to patient size and/or use of iterative reconstruction technique. COMPARISON:  06/29/2020 FINDINGS: Alignment: Normal Skull base and vertebrae: No acute fracture. No primary bone lesion or focal pathologic process. Soft tissues and spinal canal: No prevertebral fluid or swelling. No visible canal hematoma. Disc levels: Degenerative disc disease at C3-4 and C6-7 with disc space narrowing and spurring. Mild bilateral degenerative facet disease. Upper chest: No acute findings. Other: None IMPRESSION: No acute bony abnormality. Electronically Signed   By: Charlett Nose M.D.   On: 10/14/2022 22:09   DG Ankle Complete Right  Result Date: 10/14/2022 CLINICAL DATA:  Shortness of breath, 5. EXAM: RIGHT ANKLE - COMPLETE 3+ VIEW COMPARISON:  None Available. FINDINGS: There is no evidence of fracture, dislocation, or joint effusion. shob, fall, found down fibular sideplate and screws are present which appear uncomplicated. There is lateral soft tissue swelling. IMPRESSION: Lateral soft tissue swelling. No evidence of acute fracture or dislocation. Hardware appears uncomplicated. Electronically Signed   By: Darliss Cheney M.D.   On: 10/14/2022  22:07   CT HEAD WO CONTRAST  Result Date: 10/14/2022 CLINICAL DATA:  Syncope/presyncope, cerebrovascular cause suspected EXAM: CT HEAD WITHOUT CONTRAST TECHNIQUE: Contiguous axial images were obtained from the base of the skull through the vertex without intravenous contrast. RADIATION DOSE REDUCTION: This exam was performed according to the departmental dose-optimization program which includes automated exposure control, adjustment of the mA and/or kV according to patient size and/or use of iterative reconstruction technique. COMPARISON:  02/07/2021 FINDINGS: Brain: No acute intracranial abnormality. Specifically, no hemorrhage, hydrocephalus, mass lesion, acute infarction, or significant intracranial injury. Vascular: No hyperdense vessel or unexpected calcification. Skull: No acute calvarial abnormality. Sinuses/Orbits: No acute findings Other: None IMPRESSION: No acute intracranial abnormality. Electronically Signed   By: Charlett Nose M.D.   On: 10/14/2022 22:07   DG Hip Port Unilat W or Wo Pelvis 1 View Left  Result Date: 10/14/2022 CLINICAL DATA:  Shortness of breath, fall, found down EXAM: DG HIP (WITH OR WITHOUT PELVIS) 1V PORT LEFT COMPARISON:  None Available. FINDINGS: Hand overlying the right hip compromises assessment. No definite acute fracture. No dislocation. IMPRESSION: No definite acute fracture or dislocation. Electronically Signed   By: Minerva Fester M.D.   On: 10/14/2022 22:07   DG Chest Portable 1 View  Result Date: 10/14/2022 CLINICAL DATA:  Shortness of breath, fall, found down EXAM: PORTABLE CHEST 1 VIEW COMPARISON:  02/09/2021 FINDINGS: The  heart size and mediastinal contours are within normal limits. Both lungs are clear. The visualized skeletal structures are unremarkable. IMPRESSION: No active disease. Electronically Signed   By: Minerva Fester M.D.   On: 10/14/2022 22:05    Scheduled Meds:  enoxaparin (LOVENOX) injection  40 mg Subcutaneous Q24H   folic acid  1 mg Oral  Daily   multivitamin with minerals  1 tablet Oral Daily   PHENObarbital  97.5 mg Intravenous Q8H   Followed by   Melene Muller ON 10/17/2022] PHENObarbital  65 mg Intravenous Q8H   Followed by   Melene Muller ON 10/19/2022] PHENObarbital  32.5 mg Intravenous Q8H   thiamine  100 mg Oral Daily   Or   thiamine  100 mg Intravenous Daily   Continuous Infusions:  lactated ringers 200 mL/hr at 10/15/22 0402     LOS: 0 days   Time spent:  Azucena Fallen, DO [facility]  If 7PM-7AM, please contact night-coverage [email or web address]  10/15/2022, 2:33 PM

## 2022-10-15 NOTE — Progress Notes (Signed)
Consult received for PIV placement. Assess bilateral arms. No appropriate vein found at this time for PIV placement. Notified nurse to continue fluids in RAC PIV. Veins to deep/small. 2nd VAST nurse requested to assess. Tomasita Morrow, RN VAST

## 2022-10-15 NOTE — Consult Note (Signed)
Carmen Daniels Psychiatry Consult Evaluation  Service Date: October 15, 2022 LOS:  LOS: 0 days    Primary Psychiatric Diagnoses  Bipolar I Disorder 2.  Borderline Personality Disorder 3.  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 female admitted medically on 10/14/2022  8:08 PM for being found missing and unconscious surrounded by vomit and pills. She carries the psychiatric diagnoses of bipolar I disorder, borderline personality disorder, chronic pain syndrome, and has a past medical history of migraines, seasonal allergies, asthma, ovarian cysts, pneumonia, past intentional overdose, allergies, seizures . She reports current drug use. She reports that she does not drink alcohol. Psychiatry was consulted for concern for suicide attempt, medication management by Azucena Fallen, MD  on 10/15/2022.    Her current presentation of altered mental status is most consistent with unintentional drug overdose. She meets criteria for drug overdose based on patient report, toxicology results. Current outpatient psychotropic medications include atogepant, beclomethasone, clonazepam, duloxetine, gabapentin, haloperidol, lumateperone tosylate, metoclopramide, oxycodone, sumatriptan, trazodone and historically she has had a mixed response to these medications. She was  compliant with medications prior to admission as evidenced by chart review. On initial examination, patient was too somnolent for full exam. Please see plan below for detailed recommendations.   Diagnoses:  Active Hospital problems: Principal Problem:   Rhabdomyolysis Active Problems:   Bipolar disorder (HCC)   AMS (altered mental status)   Non-traumatic rhabdomyolysis   Encephalopathy, toxic     Plan   ## Psychiatric Medication Recommendations:  -- Do not add antipsychotic medications at this time, pt QTC is prolonged.   ## Medical Decision Making Capacity:   Capacity was not formally addressed  during this encounter; however, the patient's mental status was too poor to respond.  ## Further Work-up:  -- Additional EKG ordered for 8/24 AM. Confirm that she no longer has a prolonged QTC prior to any psych medicine administration. Please ensure that JT interval is below 500.   -- most recent EKG on 8/22 had QtC of 587. JT interval is still over 500.  -- Pertinent labwork reviewed earlier this admission includes: UDS (+amphetamines, benzos, opiates, cocaine, THC), elevated lactic acid, urinalysis (protein, bilirubin), elevated CK (Trend [id number]), elevated glucose    ## Disposition:  -- We recommend inpatient psychiatric hospitalization after medical hospitalization. Patient is under voluntary admission status at this time; please IVC if attempts to leave hospital.  ## Behavioral / Environmental:  --   Patients with borderline personality traits/disorder often use the language of physical pain to communicate both physical and emotional suffering. It is important to address pain complaints as they arise and attempt to identify an etiology, either organic or psychiatric. In patients with chronic pain, it is important to have a discussion with the patient about expectations about pain control.    ## Safety and Observation Level:  - Based on my clinical evaluation, I estimate the patient to be at moderate risk of self harm in the current setting - At this time, we recommend a standard level of observation. This decision is based on my review of the chart including patient's history and current presentation, interview of the patient, mental status examination, and consideration of suicide risk including evaluating suicidal ideation, plan, intent, suicidal or self-harm behaviors, risk factors, and protective factors. This judgment is based on our ability to directly address suicide risk, implement suicide prevention strategies and develop a safety plan while the patient is in the clinical  setting. Please contact our team if there is a concern that risk level has changed.  Suicide risk assessment  Patient has following modifiable risk factors for suicide: recklessness, which we are addressing by recommending psychiatric hospitalization after medical clearance.   Patient has following non-modifiable or demographic risk factors for suicide: history of suicide attempt, history of self harm behavior, and psychiatric hospitalization  Patient has the following protective factors against suicide: Supportive family, Supportive friends, and Cultural, spiritual, or religious beliefs that discourage suicide  Thank you for this consult request. Recommendations have been communicated to the primary team.  We will follow at this time.   Margaretmary Dys, MD  Psychiatric and Social History   Relevant Aspects of Hospital Course:  Admitted on 10/14/2022 for found down at home by boyfriend. The Patient's family found her on the floor around vomit and pills. She reported been down for around 20 hours. She had bruising and wounds on her head and side. She was arousable however was noted to have episodes of apnea. Patient's family was concerned that she may be having withdrawal to fentanyl or benzos. Labs were obtained on presentation which were revealed WBC 24.4, hemoglobin 16.8, platelets 410, creatinine 1, CK 14,390, lactic acid 2.1, UDS positive for opioids, cocaine, benzos, amphetamines, THC. Chest x-ray showed no acute disease. X-ray of hip and ankle showed no evidence of fracture CT of spine showed no acute abnormalities patient was admitted for further workup. On admission she was difficult to arouse. .   Patient Report:   8/23: Met patient bedside with attending Dr Viviano Simas. Pt was still too somnolent to participate in much of the interview. She voiced that it had NOT been an intentional overdose and that she was not surprised to have awoken. She mentioned acute grief over the death  (uncertain means) of her best friend in [place]'s apartment. Pt is still in acute discomfort and was wearing a neck brace. Pt was agreeable to signing a voluntary admission form for an inpatient psychiatric hospitalization.  Psychiatric ROS 8/23 Mood Symptoms Could not assess due to patient sleepiness.   Manic Symptoms Could not assess due to patient sleepiness.   Anxiety Symptoms Could not assess due to patient sleepiness.   Trauma Symptoms Could not assess due to patient sleepiness.   Psychosis Symptoms Could not assess due to patient sleepiness.   Collateral information:    Psychiatric History:  Information collected from chart review  Prev Dx/Sx: bipolar 1 disorder, borderline personality disorder, polysubstance use disorder, social anxiety disorder, anxiety, depression, chronic pain, seizures Current Psych Provider: Shanna Cisco, NP (Nurse Practitioner)  Current Meds: Cymbalta 120 mg daily, Caplyta 42 mg daily, Haldol 2 mg nightly, and trazodone 100 mg nightly as needed.  Patient is also prescribed Ambien 10 mg nightly,  gabapentin 400 mg 4 times daily, and Klonopin 1 mg 4 times daily  Previous Med Trials: Long medical history, most psychotropics have been attempted Therapy: Yes  Prior ECT: None Prior Psych Hospitalization: [facility]  05/07/2022, [facility]  03/14/2021, [facility] 2019, [facility] 2009 Prior Self Harm: Past suicidal attempts: 2023, 2021, past self harm, Prior Violence: yes  Family Psych History:Depression in her paternal grandmother;  Family Hx suicide: None found on chart review  Social History:  Developmental Hx: Could not assess due to patient sleepiness Educational Hx: Could not assess due to patient sleepiness Occupational Hx: Could not assess due to patient sleepiness Legal Hx: Could not assess due to patient  sleepiness Living Situation: Lives with significant other Spiritual Hx: Could not assess due to patient  sleepiness Access to weapons: Must assess prior to d/c    Tobacco use: yes, 1 ppd Alcohol use: No Drug use: yes.   10/14/22 23:32  Amphetamines POSITIVE !  Barbiturates NONE DETECTED  Benzodiazepines POSITIVE !  Opiates POSITIVE !  COCAINE POSITIVE !  Tetrahydrocannabinol POSITIVE !  8/23: BF in the room reports that she has been using fentanyl recreationally as well as takes a large amount of Klonopin which is prescribed to her, however she has been out of fentanyl for 2 days and not been taking her benzos, concern for acute opioid as well as benzodiazepine withdrawal.   Exam Findings   Psychiatric Specialty Exam:  Presentation  General Appearance: Appropriate for Environment  Eye Contact:None  Speech:Garbled; Slurred  Speech Volume:Decreased  Handedness:Right   Mood and Affect  Mood:Depressed  Affect:Tearful; Congruent   Thought Process  Thought Processes:Coherent  Descriptions of Associations:Intact  Orientation:Partial  Thought Content:Logical  History of Schizophrenia/Schizoaffective disorder:No data recorded Duration of Psychotic Symptoms:No data recorded Hallucinations:Hallucinations: None  Ideas of Reference:None  Suicidal Thoughts:Suicidal Thoughts: No  Homicidal Thoughts:Homicidal Thoughts: No   Sensorium  Memory:Immediate Fair; Other (comment) (difficult to assess due to patient's fatigue/sleepiness)  Judgment:Impaired  Insight:Present   Executive Functions  Concentration:Poor (Could not assess due to patient sleepiness.)  Attention Span:-- (Could not assess due to patient sleepiness.)  Recall:-- (Could not assess due to patient sleepiness.)  Fund of Knowledge:-- (Could not assess due to patient sleepiness.)  Language:-- (Could not assess due to patient sleepiness.)   Psychomotor Activity  Psychomotor Activity:Psychomotor Activity: Other (comment) (Could not assess due to patient sleepiness.)   Assets  Assets:Other  (comment) (Could not assess due to patient sleepiness.)   Sleep  Sleep:Sleep: -- (Could not assess due to patient sleepiness.)    Physical Exam: Vital signs:  Temp:  [97.4 F (36.3 C)-99.1 F (37.3 C)] 98.3 F (36.8 C) (08/23 0829) Pulse Rate:  [83-141] 127 (08/23 0829) Resp:  [19-22] 19 (08/23 1433) BP: (72-142)/(46-107) 113/93 (08/23 1433) SpO2:  [96 %-100 %] 99 % (08/23 1433) Weight:  [79.3 kg] 79.3 kg (08/23 0223)  Physical Exam Vitals and nursing note reviewed.  Constitutional:      Appearance: She is ill-appearing and toxic-appearing.  Pulmonary:     Effort: Pulmonary effort is normal.  Musculoskeletal:        General: Signs of injury present.  Skin:    Findings: Bruising present.  Neurological:     Mental Status: She is disoriented.     Blood pressure (!) 113/93, pulse (!) 127, temperature 98.3 F (36.8 C), resp. rate 19, height 5\' 11"  (1.803 m), weight 79.3 kg, SpO2 99%. Body mass index is 24.38 kg/m.   Other History   These have been pulled in through the EMR, reviewed, and updated if appropriate.   Family History:   The patient's family history includes Arthritis in her maternal grandfather and maternal grandmother; Asthma in an other family member; COPD in an other family member; Cancer in an other family member; Depression in her paternal grandmother; Healthy in her father and mother; Hyperlipidemia in an other family member; Hypertension in an other family member; Stroke in an other family member.  Medical History: Past Medical History:  Diagnosis Date   Allergic rhinitis    Anxiety    Arthritis    Asthma    Bipolar disorder (HCC)    Borderline personality disorder (HCC)  Bupropion overdose    Chronic pain syndrome    Depression    Family history of adverse reaction to anesthesia    Mom - N/V   Fibromyalgia    generalized   Hallucinations    02/08/22- have cleared mom said.   Migraines    Nasal polyps    OCD (obsessive compulsive  disorder)    Ovarian cyst    left   Pneumonia    2021   PONV (postoperative nausea and vomiting)    Schizophrenia (HCC)    schizoaffective   Seizures (HCC)    patient attenped sudide by tak alot of her medication,.    Surgical History: Past Surgical History:  Procedure Laterality Date   ABDOMINAL HYSTERECTOMY     LAPAROSCOPIC TUBAL LIGATION Bilateral 06/13/2019   Procedure: LAPAROSCOPIC TUBAL LIGATION;  Surgeon: Hermina Staggers, MD;  Location: [facility];  Service: Gynecology;  Laterality: Bilateral;  FILSHIE CLIPS   NASAL SINUS SURGERY     ORIF ANKLE FRACTURE Right 02/09/2022   Procedure: OPEN TREATMENT OF RIGHT BIMALLEOLAR ANKLE FRACTURE;  Surgeon: Terance Hart, MD;  Location: [facility] OR;  Service: Orthopedics;  Laterality: Right;   TUBAL LIGATION     tubes in ears     VAGINAL HYSTERECTOMY Bilateral 01/15/2020   Procedure: HYSTERECTOMY VAGINAL;  Surgeon: Hermina Staggers, MD;  Location: [facility] OR;  Service: Gynecology;  Laterality: Bilateral;   WISDOM TOOTH EXTRACTION      Medications:   Current Facility-Administered Medications:    acetaminophen (TYLENOL) tablet 650 mg, 650 mg, Oral, Q6H PRN, 650 mg at 10/15/22 1120 **OR** acetaminophen (TYLENOL) suppository 650 mg, 650 mg, Rectal, Q6H PRN, Dorrell, Robert, MD   enoxaparin (LOVENOX) injection 40 mg, 40 mg, Subcutaneous, Q24H, Dorrell, Robert, MD, 40 mg at 10/15/22 1536   folic acid (FOLVITE) tablet 1 mg, 1 mg, Oral, Daily, Dorrell, Robert, MD, 1 mg at 10/15/22 1524   lactated ringers infusion, , Intravenous, Continuous, Dorrell, Robert, MD, Last Rate: 200 mL/hr at 10/15/22 0402, New Bag at 10/15/22 0402   multivitamin with minerals tablet 1 tablet, 1 tablet, Oral, Daily, Dorrell, Robert, MD, 1 tablet at 10/15/22 1524   ondansetron (ZOFRAN) tablet 4 mg, 4 mg, Oral, Q6H PRN, 4 mg at 10/15/22 1119 **OR** ondansetron (ZOFRAN) injection 4 mg, 4 mg, Intravenous, Q6H PRN, Dorrell, Robert, MD   PHENObarbital (LUMINAL)  injection 97.5 mg, 97.5 mg, Intravenous, Q8H, 97.5 mg at 10/15/22 1529 **FOLLOWED BY** [START ON 10/17/2022] PHENObarbital (LUMINAL) injection 65 mg, 65 mg, Intravenous, Q8H **FOLLOWED BY** [START ON 10/19/2022] PHENObarbital (LUMINAL) injection 32.5 mg, 32.5 mg, Intravenous, Q8H, Dorrell, Robert, MD   phenol (CHLORASEPTIC) mouth spray 1 spray, 1 spray, Mouth/Throat, PRN, Azucena Fallen, MD   thiamine (VITAMIN B1) tablet 100 mg, 100 mg, Oral, Daily, 100 mg at 10/15/22 1524 **OR** thiamine (VITAMIN B1) injection 100 mg, 100 mg, Intravenous, Daily, Alan Mulder, MD  Allergies: Allergies  Allergen Reactions   Keflex [Cephalexin] Anaphylaxis    Has tolerated amoxicillin 2019/2020

## 2022-10-15 NOTE — ED Notes (Signed)
ED TO INPATIENT HANDOFF REPORT  ED Nurse Name and Phone #: [phone]  S Name/Age/Gender Carmen Daniels 37 y.o. female Room/Bed: 035C/035C  Code Status   Code Status: Prior  Home/SNF/Other Home Patient oriented to: self, place, time, and situation Is this baseline? Yes   Triage Complete: Triage complete  Chief Complaint Rhabdomyolysis [M62.82]  Triage Note Patient arrives via ems. Boyfriend states he has not seen patient since lastnight at 2200. Boyfriend found her on the floor with emesis, and pills scattered on the floor. Patient reports getting up to vomit but unsure when she fell. Patient has bruising to the right eye. Extremities where cold to palpation. Patient has redness to the right ankle. On scene patient had periods of apnea. Patient is now responsive and verbal in room. Patient is lethargic and slow to speak. Possible surgery to the right ankle.    Allergies Allergies  Allergen Reactions   Keflex [Cephalexin] Anaphylaxis    Has tolerated amoxicillin 2019/2020    Level of Care/Admitting Diagnosis ED Disposition     ED Disposition  Admit   Condition  --   Comment  Hospital Area: [facility] [[id number]]  Level of Care: Telemetry Medical [104]  May admit patient to [name] or [facility] if equivalent level of care is available:: Yes  Covid Evaluation: Asymptomatic - no recent exposure (last 10 days) testing not required  Diagnosis: Rhabdomyolysis [728.88.ICD-9-CM]  Admitting Physician: Alan Mulder [[id number]]  Attending Physician: Alan Mulder [[id number]]  Certification:: I certify this patient will need inpatient services for at least 2 midnights  Expected Medical Readiness: 10/17/2022          B Medical/Surgery History Past Medical History:  Diagnosis Date   Allergic rhinitis    Anxiety    Arthritis    Asthma    Bipolar disorder (HCC)    Borderline personality disorder (HCC)    Bupropion overdose    Chronic pain syndrome     Depression    Family history of adverse reaction to anesthesia    Mom - N/V   Fibromyalgia    generalized   Hallucinations    02/08/22- have cleared mom said.   Migraines    Nasal polyps    OCD (obsessive compulsive disorder)    Ovarian cyst    left   Pneumonia    2021   PONV (postoperative nausea and vomiting)    Schizophrenia (HCC)    schizoaffective   Seizures (HCC)    patient attenped sudide by tak alot of her medication,.   Past Surgical History:  Procedure Laterality Date   ABDOMINAL HYSTERECTOMY     LAPAROSCOPIC TUBAL LIGATION Bilateral 06/13/2019   Procedure: LAPAROSCOPIC TUBAL LIGATION;  Surgeon: Hermina Staggers, MD;  Location:  SURGERY CENTER;  Service: Gynecology;  Laterality: Bilateral;  FILSHIE CLIPS   NASAL SINUS SURGERY     ORIF ANKLE FRACTURE Right 02/09/2022   Procedure: OPEN TREATMENT OF RIGHT BIMALLEOLAR ANKLE FRACTURE;  Surgeon: Terance Hart, MD;  Location: [facility] OR;  Service: Orthopedics;  Laterality: Right;   TUBAL LIGATION     tubes in ears     VAGINAL HYSTERECTOMY Bilateral 01/15/2020   Procedure: HYSTERECTOMY VAGINAL;  Surgeon: Hermina Staggers, MD;  Location: [facility] OR;  Service: Gynecology;  Laterality: Bilateral;   WISDOM TOOTH EXTRACTION       A IV Location/Drains/Wounds Patient Lines/Drains/Airways Status     Active Line/Drains/Airways     Name Placement date Placement time Site Days  Peripheral IV 10/14/22 24 G Left Antecubital 10/14/22  2014  Antecubital  1   Peripheral IV 10/14/22 20 G 1.75" Right Forearm 10/14/22  2251  Forearm  1            Intake/Output Last 24 hours No intake or output data in the 24 hours ending 10/15/22 0111  Labs/Imaging Results for orders placed or performed during the hospital encounter of 10/14/22 (from the past 48 hour(s))  CBG monitoring, ED     Status: Abnormal   Collection Time: 10/14/22  8:18 PM  Result Value Ref Range   Glucose-Capillary 130 (H) 70 - 99 mg/dL    Comment: Glucose  reference range applies only to samples taken after fasting for at least 8 hours.  Basic metabolic panel     Status: Abnormal   Collection Time: 10/14/22  8:30 PM  Result Value Ref Range   Sodium 138 135 - 145 mmol/L   Potassium 4.0 3.5 - 5.1 mmol/L   Chloride 95 (L) 98 - 111 mmol/L   CO2 29 22 - 32 mmol/L   Glucose, Bld 134 (H) 70 - 99 mg/dL    Comment: Glucose reference range applies only to samples taken after fasting for at least 8 hours.   BUN 12 6 - 20 mg/dL   Creatinine, Ser 0.34 (H) 0.44 - 1.00 mg/dL   Calcium 9.9 8.9 - 74.2 mg/dL   GFR, Estimated >59 >56 mL/min    Comment: (NOTE) Calculated using the CKD-EPI Creatinine Equation (2021)    Anion gap 14 5 - 15    Comment: Performed at [facility], [address], [place], [place] [address]  CBC     Status: Abnormal   Collection Time: 10/14/22  8:30 PM  Result Value Ref Range   WBC 24.5 (H) 4.0 - 10.5 K/uL   RBC 5.92 (H) 3.87 - 5.11 MIL/uL   Hemoglobin 16.8 (H) 12.0 - 15.0 g/dL   HCT 43.3 (H) 29.5 - 18.8 %   MCV 82.4 80.0 - 100.0 fL   MCH 28.4 26.0 - 34.0 pg   MCHC 34.4 30.0 - 36.0 g/dL   RDW 41.6 60.6 - 30.1 %   Platelets 410 (H) 150 - 400 K/uL   nRBC 0.0 0.0 - 0.2 %    Comment: Performed at [facility], [address] [place], [place] [address]  CK     Status: Abnormal   Collection Time: 10/14/22  8:30 PM  Result Value Ref Range   Total CK 14,390 (H) 38 - 234 U/L    Comment: RESULT CONFIRMED BY MANUAL DILUTION Performed at [facility], [address], [place], [place] [address]   Ammonia     Status: None   Collection Time: 10/14/22  8:30 PM  Result Value Ref Range   Ammonia 13 9 - 35 umol/L    Comment: Performed at [facility], [address], [place], [place] [address]  Acetaminophen level     Status: Abnormal   Collection Time: 10/14/22  8:30 PM  Result Value Ref Range   Acetaminophen (Tylenol), Serum <10 (L) 10 - 30 ug/mL    Comment: (NOTE) Therapeutic concentrations vary  significantly. A range of 10-30 ug/mL  may be an effective concentration for many patients. However, some  are best treated at concentrations outside of this range. Acetaminophen concentrations >150 ug/mL at 4 hours after ingestion  and >50 ug/mL at 12 hours after ingestion are often associated with  toxic reactions.  Performed at [facility], [address], [place], [place] [address]   Ethanol     Status: None   Collection Time: 10/14/22  8:30 PM  Result Value Ref Range   Alcohol, Ethyl (B) <10 <10 mg/dL    Comment: (NOTE) Lowest detectable limit for serum alcohol is 10 mg/dL.  For medical purposes only. Performed at [facility], [address], [place], [place] [address]   Salicylate level     Status: Abnormal   Collection Time: 10/14/22  8:30 PM  Result Value Ref Range   Salicylate Lvl <7.0 (L) 7.0 - 30.0 mg/dL    Comment: Performed at [facility], [address] [place], [place] [address]  I-Stat CG4 Lactic Acid     Status: Abnormal   Collection Time: 10/14/22  8:41 PM  Result Value Ref Range   Lactic Acid, Venous 2.1 (HH) 0.5 - 1.9 mmol/L   Comment NOTIFIED PHYSICIAN   I-stat chem 8, ED (not at [facility], [facility] or [facility])     Status: Abnormal   Collection Time: 10/14/22  8:45 PM  Result Value Ref Range   Sodium 137 135 - 145 mmol/L   Potassium 4.0 3.5 - 5.1 mmol/L   Chloride 97 (L) 98 - 111 mmol/L   BUN 15 6 - 20 mg/dL   Creatinine, Ser 2.13 0.44 - 1.00 mg/dL   Glucose, Bld 086 (H) 70 - 99 mg/dL    Comment: Glucose reference range applies only to samples taken after fasting for at least 8 hours.   Calcium, Ion 1.12 (L) 1.15 - 1.40 mmol/L   TCO2 29 22 - 32 mmol/L   Hemoglobin 17.7 (H) 12.0 - 15.0 g/dL   HCT 57.8 (H) 46.9 - 62.9 %  I-Stat CG4 Lactic Acid     Status: Abnormal   Collection Time: 10/14/22 10:50 PM  Result Value Ref Range   Lactic Acid, Venous 2.2 (HH) 0.5 - 1.9 mmol/L   Comment NOTIFIED PHYSICIAN   Urinalysis, Routine w reflex  microscopic -Urine, Clean Catch     Status: Abnormal   Collection Time: 10/14/22 11:32 PM  Result Value Ref Range   Color, Urine AMBER (A) YELLOW    Comment: BIOCHEMICALS MAY BE AFFECTED BY COLOR   APPearance CLOUDY (A) CLEAR   Specific Gravity, Urine 1.030 1.005 - 1.030   pH 5.0 5.0 - 8.0   Glucose, UA NEGATIVE NEGATIVE mg/dL   Hgb urine dipstick SMALL (A) NEGATIVE   Bilirubin Urine MODERATE (A) NEGATIVE   Ketones, ur 5 (A) NEGATIVE mg/dL   Protein, ur 528 (A) NEGATIVE mg/dL   Nitrite NEGATIVE NEGATIVE   Leukocytes,Ua NEGATIVE NEGATIVE   RBC / HPF 11-20 0 - 5 RBC/hpf   WBC, UA 6-10 0 - 5 WBC/hpf   Bacteria, UA RARE (A) NONE SEEN   Squamous Epithelial / HPF 0-5 0 - 5 /HPF   Mucus PRESENT    Hyaline Casts, UA PRESENT     Comment: Performed at [facility], [address], [place], [place] [address]  Rapid urine drug screen (hospital performed)     Status: Abnormal   Collection Time: 10/14/22 11:32 PM  Result Value Ref Range   Opiates POSITIVE (A) NONE DETECTED   Cocaine POSITIVE (A) NONE DETECTED   Benzodiazepines POSITIVE (A) NONE DETECTED   Amphetamines POSITIVE (A) NONE DETECTED   Tetrahydrocannabinol POSITIVE (A) NONE DETECTED   Barbiturates NONE DETECTED NONE DETECTED    Comment: (NOTE)  DRUG SCREEN FOR MEDICAL PURPOSES ONLY.  IF CONFIRMATION IS NEEDED FOR ANY PURPOSE, NOTIFY LAB WITHIN 5 DAYS.  LOWEST DETECTABLE LIMITS FOR URINE DRUG SCREEN Drug Class                     Cutoff (ng/mL) Amphetamine and metabolites    1000 Barbiturate and metabolites    200 Benzodiazepine                 200 Opiates and metabolites        300 Cocaine and metabolites        300 THC                            50 Performed at [facility], [address] [place], [place] [address]    CT Lumbar Spine Wo Contrast  Result Date: 10/14/2022 CLINICAL DATA:  Unwitnessed fall, found on the floor in her bathroom by boyfriend. Unknown trauma. EXAM: CT LUMBAR SPINE WITHOUT  CONTRAST TECHNIQUE: Multidetector CT imaging of the lumbar spine was performed without intravenous contrast administration. Multiplanar CT image reconstructions were also generated. RADIATION DOSE REDUCTION: This exam was performed according to the departmental dose-optimization program which includes automated exposure control, adjustment of the mA and/or kV according to patient size and/or use of iterative reconstruction technique. COMPARISON:  CT abdomen and pelvis with contrast and reconstructions 06/05/2022 FINDINGS: Segmentation: Standard. Alignment: Slight dextroscoliosis.  No listhesis. Vertebrae: No acute fracture or focal pathologic process is evident. The sacrum and visualized pelvis are intact. Both SI joints are patent with trace spurring. Paraspinal and other soft tissues: Negative. Disc levels: There is preservation of the normal disc heights all lumbar levels from T12-L1. No herniated discs or other significant soft tissue or bony encroachment on the thecal sac are seen. No lumbar levels demonstrate foraminal stenosis. There is mild facet hypertrophy at L2-3 and L3-4, mild-to-moderate facet joint spurring at L4-5 and L5-S1. IMPRESSION: 1. No evidence of fractures. 2. Slight dextroscoliosis. 3. Nonstenosing facet hypertrophy. Electronically Signed   By: Almira Bar M.D.   On: 10/14/2022 22:18   CT Maxillofacial Wo Contrast  Result Date: 10/14/2022 CLINICAL DATA:  Syncope, facial trauma EXAM: CT MAXILLOFACIAL WITHOUT CONTRAST TECHNIQUE: Multidetector CT imaging of the maxillofacial structures was performed. Multiplanar CT image reconstructions were also generated. RADIATION DOSE REDUCTION: This exam was performed according to the departmental dose-optimization program which includes automated exposure control, adjustment of the mA and/or kV according to patient size and/or use of iterative reconstruction technique. COMPARISON:  None Available. FINDINGS: Osseous: No fracture or mandibular  dislocation. No destructive process. Orbits: Negative. No traumatic or inflammatory finding. Sinuses: No acute findings Soft tissues: Negative Limited intracranial: See head CT report IMPRESSION: No facial or orbital fracture. Electronically Signed   By: Charlett Nose M.D.   On: 10/14/2022 22:16   CT Cervical Spine Wo Contrast  Result Date: 10/14/2022 CLINICAL DATA:  Syncope, found down EXAM: CT CERVICAL SPINE WITHOUT CONTRAST TECHNIQUE: Multidetector CT imaging of the cervical spine was performed without intravenous contrast. Multiplanar CT image reconstructions were also generated. RADIATION DOSE REDUCTION: This exam was performed according to the departmental dose-optimization program which includes automated exposure control, adjustment of the mA and/or kV according to patient size and/or use of iterative reconstruction technique. COMPARISON:  06/29/2020 FINDINGS: Alignment: Normal Skull base and vertebrae: No acute fracture. No primary bone lesion or focal pathologic process. Soft tissues and spinal canal:  No prevertebral fluid or swelling. No visible canal hematoma. Disc levels: Degenerative disc disease at C3-4 and C6-7 with disc space narrowing and spurring. Mild bilateral degenerative facet disease. Upper chest: No acute findings. Other: None IMPRESSION: No acute bony abnormality. Electronically Signed   By: Charlett Nose M.D.   On: 10/14/2022 22:09   DG Ankle Complete Right  Result Date: 10/14/2022 CLINICAL DATA:  Shortness of breath, 5. EXAM: RIGHT ANKLE - COMPLETE 3+ VIEW COMPARISON:  None Available. FINDINGS: There is no evidence of fracture, dislocation, or joint effusion. shob, fall, found down fibular sideplate and screws are present which appear uncomplicated. There is lateral soft tissue swelling. IMPRESSION: Lateral soft tissue swelling. No evidence of acute fracture or dislocation. Hardware appears uncomplicated. Electronically Signed   By: Darliss Cheney M.D.   On: 10/14/2022 22:07   CT  HEAD WO CONTRAST  Result Date: 10/14/2022 CLINICAL DATA:  Syncope/presyncope, cerebrovascular cause suspected EXAM: CT HEAD WITHOUT CONTRAST TECHNIQUE: Contiguous axial images were obtained from the base of the skull through the vertex without intravenous contrast. RADIATION DOSE REDUCTION: This exam was performed according to the departmental dose-optimization program which includes automated exposure control, adjustment of the mA and/or kV according to patient size and/or use of iterative reconstruction technique. COMPARISON:  02/07/2021 FINDINGS: Brain: No acute intracranial abnormality. Specifically, no hemorrhage, hydrocephalus, mass lesion, acute infarction, or significant intracranial injury. Vascular: No hyperdense vessel or unexpected calcification. Skull: No acute calvarial abnormality. Sinuses/Orbits: No acute findings Other: None IMPRESSION: No acute intracranial abnormality. Electronically Signed   By: Charlett Nose M.D.   On: 10/14/2022 22:07   DG Hip Port Unilat W or Wo Pelvis 1 View Left  Result Date: 10/14/2022 CLINICAL DATA:  Shortness of breath, fall, found down EXAM: DG HIP (WITH OR WITHOUT PELVIS) 1V PORT LEFT COMPARISON:  None Available. FINDINGS: Hand overlying the right hip compromises assessment. No definite acute fracture. No dislocation. IMPRESSION: No definite acute fracture or dislocation. Electronically Signed   By: Minerva Fester M.D.   On: 10/14/2022 22:07   DG Chest Portable 1 View  Result Date: 10/14/2022 CLINICAL DATA:  Shortness of breath, fall, found down EXAM: PORTABLE CHEST 1 VIEW COMPARISON:  02/09/2021 FINDINGS: The heart size and mediastinal contours are within normal limits. Both lungs are clear. The visualized skeletal structures are unremarkable. IMPRESSION: No active disease. Electronically Signed   By: Minerva Fester M.D.   On: 10/14/2022 22:05    Pending Labs Unresulted Labs (From admission, onward)    None       Vitals/Pain Today's Vitals    10/14/22 2016 10/14/22 2017 10/14/22 2300 10/15/22 0000  BP: 90/69  136/77 126/83  Pulse: 83  99 (!) 101  Resp: 19  20 20   Temp:  97.8 F (36.6 C)    TempSrc:  Oral    SpO2: 100%  100% 100%    Isolation Precautions No active isolations  Medications Medications  lactated ringers bolus 1,000 mL (1,000 mLs Intravenous New Bag/Given 10/14/22 2045)  morphine (PF) 4 MG/ML injection 4 mg (4 mg Intravenous Given 10/14/22 2225)  lactated ringers bolus 1,000 mL (1,000 mLs Intravenous New Bag/Given 10/15/22 0000)  morphine (PF) 4 MG/ML injection 4 mg (4 mg Intravenous Given 10/14/22 2356)    Mobility walks     Focused Assessments N/a   R Recommendations: See Admitting Provider Note  Report given to:   Additional Notes: n/a

## 2022-10-15 NOTE — H&P (Signed)
History and Physical    Carmen Daniels WGN:[id number] DOB: 08/19/1985 DOA: 10/14/2022  PCP: Salli Real, MD   Chief Complaint: Leeroy Bock  HPI: Carmen Daniels is a 37 y.o. female with medical history significant of bipolar, substance abuse disorder, prior overdose who presented to the emergency department after being found down.  Patient's family found patient on the floor around vomit and pills.  She reported been down for around 20 hours.  She had bruising and wounds on her head and side.  She was arousable however was noted to have episodes of apnea.  Patient's family was concerned that she may be having withdrawal to fentanyl or benzos.  Labs were obtained on presentation which were revealed WBC 24.4, hemoglobin 16.8, platelets 410, creatinine 1, CK 14,390, lactic acid 2.1, UDS positive for opioids, cocaine, benzos, amphetamines, THC.  Chest x-ray showed no acute disease.  X-ray of hip and ankle showed no evidence of fracture CT of spine showed no acute abnormalities patient was admitted for further workup.  On admission she was difficult to arouse.  She would follow commands however fall immediately back asleep.  No family was at bedside to provide additional history.   Review of Systems: Review of Systems  Constitutional:  Negative for chills and fever.  Eyes: Negative.   Respiratory: Negative.    Cardiovascular: Negative.   Gastrointestinal: Negative.   Genitourinary: Negative.   Musculoskeletal: Negative.   Skin:  Positive for rash.  Neurological:  Positive for weakness.  Endo/Heme/Allergies: Negative.   Psychiatric/Behavioral: Negative.       As per HPI otherwise 10 point review of systems negative.   Allergies  Allergen Reactions   Keflex [Cephalexin] Anaphylaxis    Has tolerated amoxicillin 2019/2020    Past Medical History:  Diagnosis Date   Allergic rhinitis    Anxiety    Arthritis    Asthma    Bipolar disorder (HCC)    Borderline personality disorder (HCC)    Bupropion  overdose    Chronic pain syndrome    Depression    Family history of adverse reaction to anesthesia    Mom - N/V   Fibromyalgia    generalized   Hallucinations    02/08/22- have cleared mom said.   Migraines    Nasal polyps    OCD (obsessive compulsive disorder)    Ovarian cyst    left   Pneumonia    2021   PONV (postoperative nausea and vomiting)    Schizophrenia (HCC)    schizoaffective   Seizures (HCC)    patient attenped sudide by tak alot of her medication,.    Past Surgical History:  Procedure Laterality Date   ABDOMINAL HYSTERECTOMY     LAPAROSCOPIC TUBAL LIGATION Bilateral 06/13/2019   Procedure: LAPAROSCOPIC TUBAL LIGATION;  Surgeon: Hermina Staggers, MD;  Location: [facility];  Service: Gynecology;  Laterality: Bilateral;  FILSHIE CLIPS   NASAL SINUS SURGERY     ORIF ANKLE FRACTURE Right 02/09/2022   Procedure: OPEN TREATMENT OF RIGHT BIMALLEOLAR ANKLE FRACTURE;  Surgeon: Terance Hart, MD;  Location: [facility] OR;  Service: Orthopedics;  Laterality: Right;   TUBAL LIGATION     tubes in ears     VAGINAL HYSTERECTOMY Bilateral 01/15/2020   Procedure: HYSTERECTOMY VAGINAL;  Surgeon: Hermina Staggers, MD;  Location: [facility] OR;  Service: Gynecology;  Laterality: Bilateral;   WISDOM TOOTH EXTRACTION       reports that she has been smoking cigarettes. She has a 7.5  pack-year smoking history. She has never used smokeless tobacco. She reports current drug use. She reports that she does not drink alcohol.  Family History  Problem Relation Age of Onset   Healthy Mother    Healthy Father    Arthritis Maternal Grandmother    Arthritis Maternal Grandfather    Depression Paternal Grandmother    Hyperlipidemia Other    Stroke Other    Hypertension Other    Cancer Other    COPD Other    Asthma Other     Prior to Admission medications   Medication Sig Start Date End Date Taking? Authorizing Provider  Atogepant (QULIPTA) 60 MG TABS Take 60 mg by mouth  daily. Patient taking differently: Take 60 mg by mouth daily. Does not have, waiting for [employer] to approved 01/12/22   Levert Feinstein, MD  beclomethasone (QVAR) 40 MCG/ACT inhaler Inhale 1 puff into the lungs 2 (two) times daily as needed (asthma).     [provider]  BOTOX 200 units injection To be administered by MD. Inject 200 units intramuscularly every 3 months. 06/28/22   Levert Feinstein, MD  botulinum toxin Type A (BOTOX) 100 units SOLR injection Inject 200 Units into the muscle every 3 (three) months. Inject into head and neck muscles 04/30/19   Levert Feinstein, MD  clonazePAM (KLONOPIN) 1 MG tablet Take 1 mg by mouth 4 (four) times daily. 07/14/19   [provider]  doxycycline (VIBRAMYCIN) 100 MG capsule Take 1 capsule (100 mg total) by mouth 2 (two) times daily. 06/05/22   Carmel Sacramento A, PA-C  DULoxetine (CYMBALTA) 60 MG capsule Take 2 capsules (120 mg total) by mouth daily. 08/04/22   Shanna Cisco, NP  gabapentin (NEURONTIN) 400 MG capsule Take 400 mg by mouth 4 (four) times daily.    [provider]  haloperidol (HALDOL) 5 MG tablet Take 1 tablet (5 mg total) by mouth at bedtime. 08/04/22   Shanna Cisco, NP  lumateperone tosylate (CAPLYTA) 42 MG capsule Take 1 capsule (42 mg total) by mouth daily. 08/04/22   Shanna Cisco, NP  metoCLOPramide (REGLAN) 10 MG tablet Take 1 tablet (10 mg total) by mouth every 6 (six) hours. 06/05/22   Carmel Sacramento A, PA-C  Multiple Vitamins-Calcium (ONE-A-DAY WOMENS FORMULA PO) Take 1 tablet by mouth daily.    [provider]  naproxen (NAPROSYN) 500 MG tablet Take 1 tablet (500 mg total) by mouth 2 (two) times daily. 06/05/22   Carmel Sacramento A, PA-C  oxyCODONE (ROXICODONE) 5 MG immediate release tablet Take 1 tablet (5 mg total) by mouth every 4 (four) hours as needed for severe pain. 02/09/22   [place], Jesse J, PA-C  SUMAtriptan (IMITREX) 100 MG tablet May repeat in 2 hours if headache persists or recurs. Do no  refill in less than 30 days 01/12/22   Levert Feinstein, MD  traZODone (DESYREL) 100 MG tablet Take 1 tablet (100 mg total) by mouth at bedtime as needed for sleep. 08/04/22   Shanna Cisco, NP    Physical Exam: Vitals:   10/15/22 0115 10/15/22 0130 10/15/22 0211 10/15/22 0223  BP: (!) 138/92 (!) 142/107 (!) 72/60 (!) 136/47  Pulse: (!) 105 (!) 103 (!) 101 (!) 135  Resp: (!) 22 (!) 21    Temp:   99.1 F (37.3 C) 97.8 F (36.6 C)  TempSrc:   Oral   SpO2: 100% 100% 100% 100%   Physical Exam Constitutional:      General: She is in  acute distress.     Appearance: She is ill-appearing.  HENT:     Head: Normocephalic.     Nose: Nose normal.  Eyes:     Conjunctiva/sclera: Conjunctivae normal.     Pupils: Pupils are equal, round, and reactive to light.  Cardiovascular:     Rate and Rhythm: Tachycardia present.  Pulmonary:     Effort: Pulmonary effort is normal.  Abdominal:     General: Abdomen is flat. Bowel sounds are normal.  Musculoskeletal:        General: Normal range of motion.     Cervical back: Normal range of motion.  Skin:    General: Skin is warm.     Capillary Refill: Capillary refill takes less than 2 seconds.  Neurological:     Mental Status: She is disoriented.     Motor: Weakness present.        Labs on Admission: I have personally reviewed the patients's labs and imaging studies.  Assessment/Plan Principal Problem:   Rhabdomyolysis   # Rhabdomyolysis likely secondary to prolonged immobility-CK greater than 14,000.  Will place on IV fluids and trend creatinine.  Patient is already received 2 L of IV fluids in ER  # Acute toxic encephalopathy secondary to drug overdose-patient's UDS positive for everything except barbiturates.  Will continue to monitor mental status  # Concern for benzo withdrawal-will place on benzo sparing withdrawal with phenobarbital 130 mg every 3 hours.  Patient is taking 1 mg of Klonopin every 6 hours prior to presentation  #  Polysubstance abuse-patient will need counseling and possible rehab program at discharge   Admission status: Inpatient Telemetry Medical  Certification: The appropriate patient status for this patient is INPATIENT. Inpatient status is judged to be reasonable and necessary in order to provide the required intensity of service to ensure the patient's safety. The patient's presenting symptoms, physical exam findings, and initial radiographic and laboratory data in the context of their chronic comorbidities is felt to place them at high risk for further clinical deterioration. Furthermore, it is not anticipated that the patient will be medically stable for discharge from the hospital within 2 midnights of admission.   * I certify that at the point of admission it is my clinical judgment that the patient will require inpatient hospital care spanning beyond 2 midnights from the point of admission due to high intensity of service, high risk for further deterioration and high frequency of surveillance required.Alan Mulder MD [facility] If 7PM-7AM, please contact night-coverage [email or web address]  10/15/2022, 3:05 AM

## 2022-10-16 DIAGNOSIS — F3189 Other bipolar disorder: Secondary | ICD-10-CM | POA: Diagnosis not present

## 2022-10-16 DIAGNOSIS — T50904A Poisoning by unspecified drugs, medicaments and biological substances, undetermined, initial encounter: Secondary | ICD-10-CM | POA: Diagnosis not present

## 2022-10-16 DIAGNOSIS — F319 Bipolar disorder, unspecified: Secondary | ICD-10-CM | POA: Diagnosis not present

## 2022-10-16 DIAGNOSIS — M6282 Rhabdomyolysis: Secondary | ICD-10-CM | POA: Diagnosis not present

## 2022-10-16 DIAGNOSIS — R400 Somnolence: Secondary | ICD-10-CM | POA: Diagnosis not present

## 2022-10-16 LAB — CBC
HCT: 48.8 % — ABNORMAL HIGH (ref 36.0–46.0)
Hemoglobin: 15.9 g/dL — ABNORMAL HIGH (ref 12.0–15.0)
MCH: 28.5 pg (ref 26.0–34.0)
MCHC: 32.6 g/dL (ref 30.0–36.0)
MCV: 87.5 fL (ref 80.0–100.0)
Platelets: 306 10*3/uL (ref 150–400)
RBC: 5.58 MIL/uL — ABNORMAL HIGH (ref 3.87–5.11)
RDW: 12.5 % (ref 11.5–15.5)
WBC: 17.5 10*3/uL — ABNORMAL HIGH (ref 4.0–10.5)
nRBC: 0 % (ref 0.0–0.2)

## 2022-10-16 LAB — COMPREHENSIVE METABOLIC PANEL
ALT: 29 U/L (ref 0–44)
AST: 104 U/L — ABNORMAL HIGH (ref 15–41)
Albumin: 2.6 g/dL — ABNORMAL LOW (ref 3.5–5.0)
Alkaline Phosphatase: 74 U/L (ref 38–126)
Anion gap: 8 (ref 5–15)
BUN: 11 mg/dL (ref 6–20)
CO2: 28 mmol/L (ref 22–32)
Calcium: 8.3 mg/dL — ABNORMAL LOW (ref 8.9–10.3)
Chloride: 96 mmol/L — ABNORMAL LOW (ref 98–111)
Creatinine, Ser: 0.8 mg/dL (ref 0.44–1.00)
GFR, Estimated: >60 mL/min (ref >60)
Glucose, Bld: 119 mg/dL — ABNORMAL HIGH (ref 70–99)
Potassium: 4.3 mmol/L (ref 3.5–5.1)
Sodium: 132 mmol/L — ABNORMAL LOW (ref 135–145)
Total Bilirubin: 1 mg/dL (ref 0.3–1.2)
Total Protein: 5.9 g/dL — ABNORMAL LOW (ref 6.5–8.1)

## 2022-10-16 LAB — CK: Total CK: 4306 U/L — ABNORMAL HIGH (ref 38–234)

## 2022-10-16 MED ORDER — LUMATEPERONE TOSYLATE 42 MG PO CAPS
42.0000 mg | ORAL_CAPSULE | Freq: Every day | ORAL | Status: DC
  Administered 2022-10-16 – 2022-11-10 (×26): 42 mg via ORAL
  Filled 2022-10-16 (×28): qty 1

## 2022-10-16 MED ORDER — IBUPROFEN 200 MG PO TABS
600.0000 mg | ORAL_TABLET | ORAL | Status: DC | PRN
  Administered 2022-10-16 – 2022-10-31 (×9): 600 mg via ORAL
  Filled 2022-10-16 (×10): qty 3

## 2022-10-16 MED ORDER — NICOTINE 21 MG/24HR TD PT24
21.0000 mg | MEDICATED_PATCH | TRANSDERMAL | Status: DC
  Administered 2022-10-16 – 2022-10-23 (×5): 21 mg via TRANSDERMAL
  Filled 2022-10-16 (×6): qty 1

## 2022-10-16 NOTE — Progress Notes (Signed)
PROGRESS NOTE    Carmen Daniels  UXL:[id number] DOB: 22-Apr-1985 DOA: 10/14/2022 PCP: Salli Real, MD   Brief Narrative:  Carmen Daniels is a 37 y.o. female with medical history significant of bipolar, substance abuse disorder, prior overdose and questionable suicidal ideation at outside facility.  She presents to the ED after being found down by family.  Nearby there was noted to vomit and multiple medications.  Estimated downtime approximately 20 hours.  Given atypical labs, concern for overdose and subsequent withdrawal hospitalist was called for admission.  Psychiatry was called in consult.  Assessment & Plan:   Principal Problem:   Rhabdomyolysis Active Problems:   Bipolar disorder (HCC)   AMS (altered mental status)   Non-traumatic rhabdomyolysis   Encephalopathy, toxic  Acute toxic encephalopathy, POA At risk for polysubstance abuse Concern for overdose, incidental versus intentional -Multidrug overdose given UDS pan positive other than barbiturates -Unclear what patient has access to, attempting to get list of her home medications -Continue to follow for detox, mental status improving with supportive care -Psychiatry consulted for further evaluation, given patient's overdose, history of suicidal ideation and noted recent history of close friend's death she has agreed to voluntary commitment and inpatient psych once medically cleared. -Psychiatry initiating lumateperone 8/24  Rhabdomyolysis, nontraumatic -CK >14k at intake -downtrending slowly but appropriately -Continue aggressive -Creatinine currently within normal limits, follow urine output, high risk for renal failure  High risk for withdrawals  -Given patient's UDS panel positive for benzos as well as opiates will follow for possible withdrawals. -Avoid any sedating medications in the interim. -Phenobarbital taper initiated at intake  Lactic acidosis -Likely secondary to above complicated by rhabdomyolysis,  dehydration, polysubstance abuse and overdose  DVT prophylaxis: enoxaparin (LOVENOX) injection 40 mg Start: 10/15/22 1000 SCDs Start: 10/15/22 0303   Code Status:   Code Status: Full Code  Family Communication: None present  Status is: Inpatient  Dispo: The patient is from: Home              Anticipated d/c is to: Inpatient psych              Anticipated d/c date is: 48 to 72 hours              Patient currently not medically stable for discharge  Consultants:  Psychiatry  Procedures:  None  Antimicrobials:  None indicated  Subjective: No acute issues or events overnight, patient remains somnolent but arousable answers questions appropriately this morning  Objective: Vitals:   10/15/22 1433 10/15/22 1959 10/16/22 0420 10/16/22 0424  BP: (!) 113/93 131/88 (!) 145/91 126/69  Pulse:  96  73  Resp: 19 18 18 18   Temp:  98.3 F (36.8 C) 98.4 F (36.9 C) 98 F (36.7 C)  TempSrc:  Oral Oral Oral  SpO2: 99% 99% 97% 94%  Weight:      Height:        Intake/Output Summary (Last 24 hours) at 10/16/2022 0747 Last data filed at 10/16/2022 0423 Gross per 24 hour  Intake 240 ml  Output 600 ml  Net -360 ml   Filed Weights   10/15/22 0223  Weight: 79.3 kg    Examination:  General: Pleasantly resting in bed, No acute distress.  Somnolent but easily arousable. HEENT: Normocephalic atraumatic.  Sclerae nonicteric, noninjected.  Extraocular movements intact bilaterally. Neck: Without mass or deformity.  Trachea is midline. Lungs: Clear to auscultate bilaterally without rhonchi, wheeze, or rales. Heart: Regular rate and rhythm.  Without murmurs, rubs, or  gallops. Abdomen: Soft, nontender, nondistended.  Without guarding or rebound. Extremities: Without cyanosis, clubbing, edema, or obvious deformity. Skin: Multiple ecchymoses, wounds of various stages of healing  Data Reviewed: I have personally reviewed following labs and imaging studies  CBC: Recent Labs  Lab  10/14/22 2030 10/14/22 2045 10/15/22 0655 10/16/22 0351  WBC 24.5*  --  24.1* 17.5*  HGB 16.8* 17.7* 16.9* 15.9*  HCT 48.8* 52.0* 49.0* 48.8*  MCV 82.4  --  82.1 87.5  PLT 410*  --  423* 306   Basic Metabolic Panel: Recent Labs  Lab 10/14/22 2030 10/14/22 2045 10/15/22 0655 10/16/22 0351  NA 138 137 135 132*  K 4.0 4.0 4.0 4.3  CL 95* 97* 98 96*  CO2 29  --  26 28  GLUCOSE 134* 133* 159* 119*  BUN 12 15 12 11   CREATININE 1.01* 0.90 0.85 0.80  CALCIUM 9.9  --  9.0 8.3*   GFR: Estimated Creatinine Clearance: 106.6 mL/min (by C-G formula based on SCr of 0.8 mg/dL). Liver Function Tests: Recent Labs  Lab 10/16/22 0351  AST 104*  ALT 29  ALKPHOS 74  BILITOT 1.0  PROT 5.9*  ALBUMIN 2.6*   No results for input(s): "LIPASE", "AMYLASE" in the last 168 hours. Recent Labs  Lab 10/14/22 2030  AMMONIA 13   Coagulation Profile: No results for input(s): "INR", "PROTIME" in the last 168 hours. Cardiac Enzymes: Recent Labs  Lab 10/14/22 2030 10/15/22 0655  CKTOTAL 14,390* 6,144*   BNP (last 3 results) No results for input(s): "PROBNP" in the last 8760 hours. HbA1C: No results for input(s): "HGBA1C" in the last 72 hours. CBG: Recent Labs  Lab 10/14/22 2018  GLUCAP 130*   Lipid Profile: No results for input(s): "CHOL", "HDL", "LDLCALC", "TRIG", "CHOLHDL", "LDLDIRECT" in the last 72 hours. Thyroid Function Tests: No results for input(s): "TSH", "T4TOTAL", "FREET4", "T3FREE", "THYROIDAB" in the last 72 hours. Anemia Panel: No results for input(s): "VITAMINB12", "FOLATE", "FERRITIN", "TIBC", "IRON", "RETICCTPCT" in the last 72 hours. Sepsis Labs: Recent Labs  Lab 10/14/22 2041 10/14/22 2250  LATICACIDVEN 2.1* 2.2*    No results found for this or any previous visit (from the past 240 hour(s)).       Radiology Studies: CT Lumbar Spine Wo Contrast  Result Date: 10/14/2022 CLINICAL DATA:  Unwitnessed fall, found on the floor in her bathroom by boyfriend.  Unknown trauma. EXAM: CT LUMBAR SPINE WITHOUT CONTRAST TECHNIQUE: Multidetector CT imaging of the lumbar spine was performed without intravenous contrast administration. Multiplanar CT image reconstructions were also generated. RADIATION DOSE REDUCTION: This exam was performed according to the departmental dose-optimization program which includes automated exposure control, adjustment of the mA and/or kV according to patient size and/or use of iterative reconstruction technique. COMPARISON:  CT abdomen and pelvis with contrast and reconstructions 06/05/2022 FINDINGS: Segmentation: Standard. Alignment: Slight dextroscoliosis.  No listhesis. Vertebrae: No acute fracture or focal pathologic process is evident. The sacrum and visualized pelvis are intact. Both SI joints are patent with trace spurring. Paraspinal and other soft tissues: Negative. Disc levels: There is preservation of the normal disc heights all lumbar levels from T12-L1. No herniated discs or other significant soft tissue or bony encroachment on the thecal sac are seen. No lumbar levels demonstrate foraminal stenosis. There is mild facet hypertrophy at L2-3 and L3-4, mild-to-moderate facet joint spurring at L4-5 and L5-S1. IMPRESSION: 1. No evidence of fractures. 2. Slight dextroscoliosis. 3. Nonstenosing facet hypertrophy. Electronically Signed   By: Earlean Shawl.D.  On: 10/14/2022 22:18   CT Maxillofacial Wo Contrast  Result Date: 10/14/2022 CLINICAL DATA:  Syncope, facial trauma EXAM: CT MAXILLOFACIAL WITHOUT CONTRAST TECHNIQUE: Multidetector CT imaging of the maxillofacial structures was performed. Multiplanar CT image reconstructions were also generated. RADIATION DOSE REDUCTION: This exam was performed according to the departmental dose-optimization program which includes automated exposure control, adjustment of the mA and/or kV according to patient size and/or use of iterative reconstruction technique. COMPARISON:  None Available.  FINDINGS: Osseous: No fracture or mandibular dislocation. No destructive process. Orbits: Negative. No traumatic or inflammatory finding. Sinuses: No acute findings Soft tissues: Negative Limited intracranial: See head CT report IMPRESSION: No facial or orbital fracture. Electronically Signed   By: Charlett Nose M.D.   On: 10/14/2022 22:16   CT Cervical Spine Wo Contrast  Result Date: 10/14/2022 CLINICAL DATA:  Syncope, found down EXAM: CT CERVICAL SPINE WITHOUT CONTRAST TECHNIQUE: Multidetector CT imaging of the cervical spine was performed without intravenous contrast. Multiplanar CT image reconstructions were also generated. RADIATION DOSE REDUCTION: This exam was performed according to the departmental dose-optimization program which includes automated exposure control, adjustment of the mA and/or kV according to patient size and/or use of iterative reconstruction technique. COMPARISON:  06/29/2020 FINDINGS: Alignment: Normal Skull base and vertebrae: No acute fracture. No primary bone lesion or focal pathologic process. Soft tissues and spinal canal: No prevertebral fluid or swelling. No visible canal hematoma. Disc levels: Degenerative disc disease at C3-4 and C6-7 with disc space narrowing and spurring. Mild bilateral degenerative facet disease. Upper chest: No acute findings. Other: None IMPRESSION: No acute bony abnormality. Electronically Signed   By: Charlett Nose M.D.   On: 10/14/2022 22:09   DG Ankle Complete Right  Result Date: 10/14/2022 CLINICAL DATA:  Shortness of breath, 5. EXAM: RIGHT ANKLE - COMPLETE 3+ VIEW COMPARISON:  None Available. FINDINGS: There is no evidence of fracture, dislocation, or joint effusion. shob, fall, found down fibular sideplate and screws are present which appear uncomplicated. There is lateral soft tissue swelling. IMPRESSION: Lateral soft tissue swelling. No evidence of acute fracture or dislocation. Hardware appears uncomplicated. Electronically Signed   By: Darliss Cheney M.D.   On: 10/14/2022 22:07   CT HEAD WO CONTRAST  Result Date: 10/14/2022 CLINICAL DATA:  Syncope/presyncope, cerebrovascular cause suspected EXAM: CT HEAD WITHOUT CONTRAST TECHNIQUE: Contiguous axial images were obtained from the base of the skull through the vertex without intravenous contrast. RADIATION DOSE REDUCTION: This exam was performed according to the departmental dose-optimization program which includes automated exposure control, adjustment of the mA and/or kV according to patient size and/or use of iterative reconstruction technique. COMPARISON:  02/07/2021 FINDINGS: Brain: No acute intracranial abnormality. Specifically, no hemorrhage, hydrocephalus, mass lesion, acute infarction, or significant intracranial injury. Vascular: No hyperdense vessel or unexpected calcification. Skull: No acute calvarial abnormality. Sinuses/Orbits: No acute findings Other: None IMPRESSION: No acute intracranial abnormality. Electronically Signed   By: Charlett Nose M.D.   On: 10/14/2022 22:07   DG Hip Port Unilat W or Wo Pelvis 1 View Left  Result Date: 10/14/2022 CLINICAL DATA:  Shortness of breath, fall, found down EXAM: DG HIP (WITH OR WITHOUT PELVIS) 1V PORT LEFT COMPARISON:  None Available. FINDINGS: Hand overlying the right hip compromises assessment. No definite acute fracture. No dislocation. IMPRESSION: No definite acute fracture or dislocation. Electronically Signed   By: Minerva Fester M.D.   On: 10/14/2022 22:07   DG Chest Portable 1 View  Result Date: 10/14/2022 CLINICAL DATA:  Shortness of breath,  fall, found down EXAM: PORTABLE CHEST 1 VIEW COMPARISON:  02/09/2021 FINDINGS: The heart size and mediastinal contours are within normal limits. Both lungs are clear. The visualized skeletal structures are unremarkable. IMPRESSION: No active disease. Electronically Signed   By: Minerva Fester M.D.   On: 10/14/2022 22:05    Scheduled Meds:  enoxaparin (LOVENOX) injection  40 mg  Subcutaneous Q24H   folic acid  1 mg Oral Daily   multivitamin with minerals  1 tablet Oral Daily   PHENObarbital  97.5 mg Intravenous Q8H   Followed by   Melene Muller ON 10/17/2022] PHENObarbital  65 mg Intravenous Q8H   Followed by   Melene Muller ON 10/19/2022] PHENObarbital  32.5 mg Intravenous Q8H   thiamine  100 mg Oral Daily   Or   thiamine  100 mg Intravenous Daily   Continuous Infusions:  lactated ringers 200 mL/hr at 10/16/22 0532     LOS: 1 day   Time spent:  Azucena Fallen, DO [facility]  If 7PM-7AM, please contact night-coverage [email or web address]  10/16/2022, 7:47 AM
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
## 2022-10-16 NOTE — Plan of Care (Signed)
  Problem: Clinical Measurements: Goal: Diagnostic test results will improve Outcome: Progressing   Problem: Nutrition: Goal: Adequate nutrition will be maintained Outcome: Progressing   Problem: Pain Managment: Goal: General experience of comfort will improve Outcome: Progressing   

## 2022-10-17 DIAGNOSIS — G929 Unspecified toxic encephalopathy: Secondary | ICD-10-CM

## 2022-10-17 DIAGNOSIS — F319 Bipolar disorder, unspecified: Secondary | ICD-10-CM | POA: Diagnosis not present

## 2022-10-17 DIAGNOSIS — T50904A Poisoning by unspecified drugs, medicaments and biological substances, undetermined, initial encounter: Secondary | ICD-10-CM | POA: Diagnosis not present

## 2022-10-17 DIAGNOSIS — R400 Somnolence: Secondary | ICD-10-CM

## 2022-10-17 DIAGNOSIS — M6282 Rhabdomyolysis: Secondary | ICD-10-CM | POA: Diagnosis not present

## 2022-10-17 LAB — COMPREHENSIVE METABOLIC PANEL
ALT: 33 U/L (ref 0–44)
AST: 113 U/L — ABNORMAL HIGH (ref 15–41)
Albumin: 2.9 g/dL — ABNORMAL LOW (ref 3.5–5.0)
Alkaline Phosphatase: 68 U/L (ref 38–126)
Anion gap: 10 (ref 5–15)
BUN: 6 mg/dL (ref 6–20)
CO2: 27 mmol/L (ref 22–32)
Calcium: 8.6 mg/dL — ABNORMAL LOW (ref 8.9–10.3)
Chloride: 97 mmol/L — ABNORMAL LOW (ref 98–111)
Creatinine, Ser: 0.84 mg/dL (ref 0.44–1.00)
GFR, Estimated: >60 mL/min (ref >60)
Glucose, Bld: 139 mg/dL — ABNORMAL HIGH (ref 70–99)
Potassium: 3.6 mmol/L (ref 3.5–5.1)
Sodium: 134 mmol/L — ABNORMAL LOW (ref 135–145)
Total Bilirubin: 1.2 mg/dL (ref 0.3–1.2)
Total Protein: 6.2 g/dL — ABNORMAL LOW (ref 6.5–8.1)

## 2022-10-17 LAB — CBC
HCT: 44.9 % (ref 36.0–46.0)
Hemoglobin: 14.9 g/dL (ref 12.0–15.0)
MCH: 29 pg (ref 26.0–34.0)
MCHC: 33.2 g/dL (ref 30.0–36.0)
MCV: 87.4 fL (ref 80.0–100.0)
Platelets: 311 10*3/uL (ref 150–400)
RBC: 5.14 MIL/uL — ABNORMAL HIGH (ref 3.87–5.11)
RDW: 12.2 % (ref 11.5–15.5)
WBC: 9.4 10*3/uL (ref 4.0–10.5)
nRBC: 0 % (ref 0.0–0.2)

## 2022-10-17 LAB — CK: Total CK: 4144 U/L — ABNORMAL HIGH (ref 38–234)

## 2022-10-17 MED ORDER — SODIUM CHLORIDE 0.9% FLUSH: 10.0000 mL | INTRAVENOUS | Status: DC | PRN

## 2022-10-17 MED ORDER — SODIUM CHLORIDE 0.9% FLUSH
10.0000 mL | Freq: Two times a day (BID) | INTRAVENOUS | Status: DC
  Administered 2022-10-17 – 2022-11-07 (×35): 10 mL

## 2022-10-17 NOTE — Progress Notes (Signed)

## 2022-10-17 NOTE — Plan of Care (Signed)
  Problem: Education: Goal: Knowledge of General Education information will improve Description: Including pain rating scale, medication(s)/side effects and non-pharmacologic comfort measures Outcome: Progressing   Problem: Health Behavior/Discharge Planning: Goal: Ability to manage health-related needs will improve Outcome: Progressing   Problem: Clinical Measurements: Goal: Ability to maintain clinical measurements within normal limits will improve Outcome: Progressing Goal: Will remain free from infection Outcome: Progressing Goal: Diagnostic test results will improve Outcome: Progressing Goal: Respiratory complications will improve Outcome: Progressing Goal: Cardiovascular complication will be avoided Outcome: Progressing   Problem: Coping: Goal: Level of anxiety will decrease Outcome: Progressing   Problem: Elimination: Goal: Will not experience complications related to urinary retention Outcome: Progressing   Problem: Pain Managment: Goal: General experience of comfort will improve Outcome: Progressing   Problem: Safety: Goal: Ability to remain free from injury will improve Outcome: Progressing   Problem: Skin Integrity: Goal: Risk for impaired skin integrity will decrease Outcome: Progressing   

## 2022-10-17 NOTE — Progress Notes (Signed)
PROGRESS NOTE    Carmen Daniels  ZOX:[id number] DOB: May 11, 1985 DOA: 10/14/2022 PCP: Salli Real, MD   Brief Narrative:  Carmen Daniels is a 37 y.o. female with medical history significant of bipolar, substance abuse disorder, prior overdose and questionable suicidal ideation at outside facility.  She presents to the ED after being found down by family.  Nearby there was noted to vomit and multiple medications.  Estimated downtime approximately 20 hours.  Given atypical labs, concern for overdose and subsequent withdrawal hospitalist was called for admission.  Psychiatry was called in consult.  Assessment & Plan:   Principal Problem:   Rhabdomyolysis Active Problems:   Bipolar disorder (HCC)   AMS (altered mental status)   Non-traumatic rhabdomyolysis   Encephalopathy, toxic  Acute toxic encephalopathy, POA At risk for polysubstance abuse Concern for overdose, incidental versus intentional -Multidrug overdose given UDS pan positive other than barbiturates -Unclear what patient has access to, attempting to get list of her home medications -Continue to follow for detox, mental status improving with supportive care -Psychiatry consulted for further evaluation, given patient's overdose, history of suicidal ideation and noted recent history of close friend's death she has agreed to voluntary commitment and inpatient psych once medically cleared. -Psychiatry initiating lumateperone 8/24  Rhabdomyolysis, nontraumatic, ongoing -CK >14k at intake -downtrending slowly but appropriately -Continue aggressive IV fluids, LR at 200 cc/h, urine output improving -Creatinine currently within normal limits, follow urine output, high risk for renal failure  High risk for withdrawals  -Given patient's UDS panel positive for benzos as well as opiates will follow for possible withdrawals. -Avoid any sedating medications in the interim. -Phenobarbital taper initiated at intake  Lactic acidosis -Likely  secondary to above complicated by rhabdomyolysis, dehydration, polysubstance abuse and overdose  DVT prophylaxis: enoxaparin (LOVENOX) injection 40 mg Start: 10/15/22 1000 SCDs Start: 10/15/22 0303 Code Status:   Code Status: Full Code Family Communication: None present  Status is: Inpatient  Dispo: The patient is from: Home              Anticipated d/c is to: Inpatient psych              Anticipated d/c date is: 48 to 72 hours              Patient currently not medically stable for discharge  Consultants:  Psychiatry  Procedures:  None  Antimicrobials:  None indicated  Subjective: No acute issues or events overnight, patient continues to complain of generalized pain and muscle aches but denies chest pain shortness of breath nausea vomiting diarrhea constipation headache fevers or chills.  Objective: Vitals:   10/16/22 0759 10/16/22 1928 10/16/22 2043 10/17/22 0502  BP: 124/80 129/82 (!) 151/88 (!) 136/113  Pulse: 72 83 94 98  Resp: 16 20 20 20   Temp: 98.8 F (37.1 C) 99 F (37.2 C) 98.1 F (36.7 C) 98.6 F (37 C)  TempSrc: Oral  Oral Oral  SpO2: 99% 98% 100% 99%  Weight:      Height:        Intake/Output Summary (Last 24 hours) at 10/17/2022 0801 Last data filed at 10/17/2022 0500 Gross per 24 hour  Intake 2124.65 ml  Output 1725 ml  Net 399.65 ml   Filed Weights   10/15/22 0223  Weight: 79.3 kg    Examination:  General: Pleasantly resting in bed, No acute distress. HEENT: Normocephalic atraumatic.  Sclerae nonicteric, noninjected.  Extraocular movements intact bilaterally. Neck: Without mass or deformity.  Trachea is midline.  Lungs: Clear to auscultate bilaterally without rhonchi, wheeze, or rales. Heart: Regular rate and rhythm.  Without murmurs, rubs, or gallops. Abdomen: Soft, nontender, nondistended.  Without guarding or rebound. Extremities: Without cyanosis, clubbing, edema, or obvious deformity. Skin: Multiple ecchymoses, wounds of various stages  of healing over her face and arms  Data Reviewed: I have personally reviewed following labs and imaging studies  CBC: Recent Labs  Lab 10/14/22 2030 10/14/22 2045 10/15/22 0655 10/16/22 0351  WBC 24.5*  --  24.1* 17.5*  HGB 16.8* 17.7* 16.9* 15.9*  HCT 48.8* 52.0* 49.0* 48.8*  MCV 82.4  --  82.1 87.5  PLT 410*  --  423* 306   Basic Metabolic Panel: Recent Labs  Lab 10/14/22 2030 10/14/22 2045 10/15/22 0655 10/16/22 0351  NA 138 137 135 132*  K 4.0 4.0 4.0 4.3  CL 95* 97* 98 96*  CO2 29  --  26 28  GLUCOSE 134* 133* 159* 119*  BUN 12 15 12 11   CREATININE 1.01* 0.90 0.85 0.80  CALCIUM 9.9  --  9.0 8.3*   GFR: Estimated Creatinine Clearance: 106.6 mL/min (by C-G formula based on SCr of 0.8 mg/dL). Liver Function Tests: Recent Labs  Lab 10/16/22 0351  AST 104*  ALT 29  ALKPHOS 74  BILITOT 1.0  PROT 5.9*  ALBUMIN 2.6*   Recent Labs  Lab 10/14/22 2030  AMMONIA 13   Cardiac Enzymes: Recent Labs  Lab 10/14/22 2030 10/15/22 0655 10/16/22 0346  CKTOTAL 14,390* 6,144* 4,306*   Sepsis Labs: Recent Labs  Lab 10/14/22 2041 10/14/22 2250  LATICACIDVEN 2.1* 2.2*   Radiology Studies: No results found.  Scheduled Meds:  enoxaparin (LOVENOX) injection  40 mg Subcutaneous Q24H   folic acid  1 mg Oral Daily   lumateperone tosylate  42 mg Oral Daily   multivitamin with minerals  1 tablet Oral Daily   nicotine  21 mg Transdermal See admin instructions   PHENObarbital  65 mg Intravenous Q8H   Followed by   Melene Muller ON 10/19/2022] PHENObarbital  32.5 mg Intravenous Q8H   thiamine  100 mg Oral Daily   Or   thiamine  100 mg Intravenous Daily   Continuous Infusions:  lactated ringers 200 mL/hr at 10/17/22 0507     LOS: 2 days   Time spent:  Azucena Fallen, DO [facility]  If 7PM-7AM, please contact night-coverage [email or web address]  10/17/2022, 8:01 AM

## 2022-10-18 DIAGNOSIS — Z79899 Other long term (current) drug therapy: Secondary | ICD-10-CM

## 2022-10-18 DIAGNOSIS — M6282 Rhabdomyolysis: Secondary | ICD-10-CM | POA: Diagnosis not present

## 2022-10-18 DIAGNOSIS — T50904A Poisoning by unspecified drugs, medicaments and biological substances, undetermined, initial encounter: Secondary | ICD-10-CM | POA: Diagnosis not present

## 2022-10-18 DIAGNOSIS — R400 Somnolence: Secondary | ICD-10-CM | POA: Diagnosis not present

## 2022-10-18 DIAGNOSIS — F319 Bipolar disorder, unspecified: Secondary | ICD-10-CM | POA: Diagnosis not present

## 2022-10-18 LAB — COMPREHENSIVE METABOLIC PANEL
ALT: 25 U/L (ref 0–44)
AST: 79 U/L — ABNORMAL HIGH (ref 15–41)
Albumin: 2.2 g/dL — ABNORMAL LOW (ref 3.5–5.0)
Alkaline Phosphatase: 51 U/L (ref 38–126)
Anion gap: 7 (ref 5–15)
BUN: 5 mg/dL — ABNORMAL LOW (ref 6–20)
CO2: 27 mmol/L (ref 22–32)
Calcium: 7.8 mg/dL — ABNORMAL LOW (ref 8.9–10.3)
Chloride: 99 mmol/L (ref 98–111)
Creatinine, Ser: 0.67 mg/dL (ref 0.44–1.00)
GFR, Estimated: >60 mL/min (ref >60)
Glucose, Bld: 89 mg/dL (ref 70–99)
Potassium: 3.5 mmol/L (ref 3.5–5.1)
Sodium: 133 mmol/L — ABNORMAL LOW (ref 135–145)
Total Bilirubin: 1 mg/dL (ref 0.3–1.2)
Total Protein: 5 g/dL — ABNORMAL LOW (ref 6.5–8.1)

## 2022-10-18 LAB — CBC
HCT: 34.6 % — ABNORMAL LOW (ref 36.0–46.0)
Hemoglobin: 11.6 g/dL — ABNORMAL LOW (ref 12.0–15.0)
MCH: 28.5 pg (ref 26.0–34.0)
MCHC: 33.5 g/dL (ref 30.0–36.0)
MCV: 85 fL (ref 80.0–100.0)
Platelets: 281 10*3/uL (ref 150–400)
RBC: 4.07 MIL/uL (ref 3.87–5.11)
RDW: 12.2 % (ref 11.5–15.5)
WBC: 8.5 10*3/uL (ref 4.0–10.5)
nRBC: 0 % (ref 0.0–0.2)

## 2022-10-18 LAB — CK: Total CK: 2627 U/L — ABNORMAL HIGH (ref 38–234)

## 2022-10-18 MED ORDER — DULOXETINE HCL 60 MG PO CPEP
120.0000 mg | ORAL_CAPSULE | Freq: Every day | ORAL | Status: DC
  Administered 2022-10-18 – 2022-11-11 (×25): 120 mg via ORAL
  Filled 2022-10-18 (×25): qty 2

## 2022-10-18 MED ORDER — SIMETHICONE 80 MG PO CHEW
80.0000 mg | CHEWABLE_TABLET | Freq: Once | ORAL | Status: AC
  Administered 2022-10-18: 80 mg via ORAL
  Filled 2022-10-18: qty 1

## 2022-10-18 MED ORDER — SACCHAROMYCES BOULARDII 250 MG PO CAPS
250.0000 mg | ORAL_CAPSULE | Freq: Two times a day (BID) | ORAL | Status: AC
  Administered 2022-10-18 – 2022-10-19 (×2): 250 mg via ORAL
  Filled 2022-10-18 (×2): qty 1

## 2022-10-18 MED ORDER — TRAZODONE HCL 50 MG PO TABS
50.0000 mg | ORAL_TABLET | Freq: Every evening | ORAL | Status: DC | PRN
  Administered 2022-10-18: 50 mg via ORAL
  Filled 2022-10-18: qty 1

## 2022-10-18 MED ORDER — ALUM & MAG HYDROXIDE-SIMETH 200-200-20 MG/5ML PO SUSP
30.0000 mL | ORAL | Status: DC | PRN
  Administered 2022-10-18 – 2022-10-20 (×2): 30 mL via ORAL
  Filled 2022-10-18 (×2): qty 30

## 2022-10-18 NOTE — Progress Notes (Signed)
[facility]  Carmen Capers NP chat concerning diarrhea and stomach issues New orders received

## 2022-10-18 NOTE — Plan of Care (Signed)

## 2022-10-18 NOTE — Progress Notes (Signed)
PROGRESS NOTE    Carmen Daniels  WUJ:[id number] DOB: Aug 11, 1985 DOA: 10/14/2022 PCP: Salli Real, MD   Brief Narrative:  Carmen Daniels is a 37 y.o. female with medical history significant of bipolar, substance abuse disorder, prior overdose and questionable suicidal ideation at outside facility.  She presents to the ED after being found down by family.  Nearby there was noted to vomit and multiple medications.  Estimated downtime approximately 20 hours.  Given atypical labs, concern for overdose and subsequent withdrawal hospitalist was called for admission.  Psychiatry was called in consult.  Assessment & Plan:   Principal Problem:   Rhabdomyolysis Active Problems:   Bipolar disorder (HCC)   AMS (altered mental status)   Non-traumatic rhabdomyolysis   Encephalopathy, toxic  Acute toxic encephalopathy, POA At risk for polysubstance abuse Concern for overdose, incidental versus intentional -Multidrug overdose given UDS pan positive other than barbiturates -Unclear what patient has access to, attempting to get list of her home medications -Continue to follow for detox, mental status improving with supportive care -Psychiatry consulted for further evaluation, given patient's overdose, history of suicidal ideation and noted recent history of close friend's death she has agreed to voluntary commitment and inpatient psych once medically cleared. -Psychiatry initiating lumateperone 8/24  Rhabdomyolysis, nontraumatic, ongoing -CK >14k at intake -downtrending slowly but appropriately - will likely need an additional 24-48h of IVF to protect from end-organ damage. - Would like to see CK <1000 prior to discharging -Continue aggressive IV fluids, LR at 200 cc/h, urine output improving appropriately -Creatinine currently within normal limits, follow urine output, high risk for renal failure  High risk for withdrawals  -Given patient's UDS panel positive for benzos as well as opiates will follow  for possible withdrawals. -Avoid any sedating medications in the interim. -Phenobarbital taper initiated at intake  Lactic acidosis -Likely secondary to above complicated by rhabdomyolysis, dehydration, polysubstance abuse and overdose  DVT prophylaxis: enoxaparin (LOVENOX) injection 40 mg Start: 10/15/22 1000 SCDs Start: 10/15/22 0303 Code Status:   Code Status: Full Code Family Communication: None present  Status is: Inpatient  Dispo: The patient is from: Home              Anticipated d/c is to: Inpatient psych              Anticipated d/c date is: 48 to 72 hours              Patient currently not medically stable for discharge  Consultants:  Psychiatry  Procedures:  None  Antimicrobials:  None indicated  Subjective: No acute issues or events overnight, patient continues to complain of generalized pain and muscle aches but denies chest pain shortness of breath nausea vomiting diarrhea constipation headache fevers or chills.  Objective: Vitals:   10/17/22 0900 10/17/22 1453 10/17/22 2041 10/18/22 0414  BP: 129/83 (!) 141/89 (!) 136/94 (!) 142/83  Pulse: 81 (!) 104 82 77  Resp: 20 20 16 16   Temp: 98.4 F (36.9 C) 98.8 F (37.1 C) 99.3 F (37.4 C) 99.1 F (37.3 C)  TempSrc: Oral Oral Oral   SpO2: 98% 98% 100% 100%  Weight:      Height:        Intake/Output Summary (Last 24 hours) at 10/18/2022 0735 Last data filed at 10/18/2022 9562 Gross per 24 hour  Intake --  Output 2050 ml  Net -2050 ml   Filed Weights   10/15/22 0223  Weight: 79.3 kg    Examination:  General: Pleasantly resting  in bed, No acute distress. HEENT: Normocephalic atraumatic.  Sclerae nonicteric, noninjected.  Extraocular movements intact bilaterally. Neck: Without mass or deformity.  Trachea is midline. Lungs: Clear to auscultate bilaterally without rhonchi, wheeze, or rales. Heart: Regular rate and rhythm.  Without murmurs, rubs, or gallops. Abdomen: Soft, nontender, nondistended.   Without guarding or rebound. Extremities: Without cyanosis, clubbing, edema, or obvious deformity. Skin: Multiple ecchymoses, wounds of various stages of healing over her face and arms  Data Reviewed: I have personally reviewed following labs and imaging studies  CBC: Recent Labs  Lab 10/14/22 2030 10/14/22 2045 10/15/22 0655 10/16/22 0351 10/17/22 1021 10/18/22 0221  WBC 24.5*  --  24.1* 17.5* 9.4 8.5  HGB 16.8* 17.7* 16.9* 15.9* 14.9 11.6*  HCT 48.8* 52.0* 49.0* 48.8* 44.9 34.6*  MCV 82.4  --  82.1 87.5 87.4 85.0  PLT 410*  --  423* 306 311 281   Basic Metabolic Panel: Recent Labs  Lab 10/14/22 2030 10/14/22 2045 10/15/22 0655 10/16/22 0351 10/17/22 1021 10/18/22 0221  NA 138 137 135 132* 134* 133*  K 4.0 4.0 4.0 4.3 3.6 3.5  CL 95* 97* 98 96* 97* 99  CO2 29  --  26 28 27 27   GLUCOSE 134* 133* 159* 119* 139* 89  BUN 12 15 12 11 6  5*  CREATININE 1.01* 0.90 0.85 0.80 0.84 0.67  CALCIUM 9.9  --  9.0 8.3* 8.6* 7.8*   GFR: Estimated Creatinine Clearance: 106.6 mL/min (by C-G formula based on SCr of 0.67 mg/dL). Liver Function Tests: Recent Labs  Lab 10/16/22 0351 10/17/22 1021 10/18/22 0221  AST 104* 113* 79*  ALT 29 33 25  ALKPHOS 74 68 51  BILITOT 1.0 1.2 1.0  PROT 5.9* 6.2* 5.0*  ALBUMIN 2.6* 2.9* 2.2*   Recent Labs  Lab 10/14/22 2030  AMMONIA 13   Cardiac Enzymes: Recent Labs  Lab 10/14/22 2030 10/15/22 0655 10/16/22 0346 10/17/22 1021 10/18/22 0221  CKTOTAL 14,390* 6,144* 4,306* 4,144* 2,627*   Sepsis Labs: Recent Labs  Lab 10/14/22 2041 10/14/22 2250  LATICACIDVEN 2.1* 2.2*   Radiology Studies: No results found.  Scheduled Meds:  enoxaparin (LOVENOX) injection  40 mg Subcutaneous Q24H   folic acid  1 mg Oral Daily   lumateperone tosylate  42 mg Oral Daily   multivitamin with minerals  1 tablet Oral Daily   nicotine  21 mg Transdermal See admin instructions   PHENObarbital  65 mg Intravenous Q8H   Followed by   [START ON  10/19/2022] PHENObarbital  32.5 mg Intravenous Q8H   sodium chloride flush  10-40 mL Intracatheter Q12H   thiamine  100 mg Oral Daily   Or   thiamine  100 mg Intravenous Daily   Continuous Infusions:  lactated ringers 200 mL/hr at 10/18/22 0544     LOS: 3 days   Time spent:  Azucena Fallen, DO [facility]  If 7PM-7AM, please contact night-coverage [email or web address]  10/18/2022, 7:35 AM

## 2022-10-19 DIAGNOSIS — F1910 Other psychoactive substance abuse, uncomplicated: Secondary | ICD-10-CM | POA: Diagnosis not present

## 2022-10-19 DIAGNOSIS — F319 Bipolar disorder, unspecified: Secondary | ICD-10-CM | POA: Diagnosis not present

## 2022-10-19 DIAGNOSIS — R400 Somnolence: Secondary | ICD-10-CM | POA: Diagnosis not present

## 2022-10-19 DIAGNOSIS — M6282 Rhabdomyolysis: Secondary | ICD-10-CM | POA: Diagnosis not present

## 2022-10-19 DIAGNOSIS — T50904A Poisoning by unspecified drugs, medicaments and biological substances, undetermined, initial encounter: Secondary | ICD-10-CM | POA: Diagnosis not present

## 2022-10-19 DIAGNOSIS — F3189 Other bipolar disorder: Secondary | ICD-10-CM | POA: Diagnosis not present

## 2022-10-19 LAB — CBC
HCT: 35.5 % — ABNORMAL LOW (ref 36.0–46.0)
Hemoglobin: 12.3 g/dL (ref 12.0–15.0)
MCH: 29.6 pg (ref 26.0–34.0)
MCHC: 34.6 g/dL (ref 30.0–36.0)
MCV: 85.5 fL (ref 80.0–100.0)
Platelets: 301 10*3/uL (ref 150–400)
RBC: 4.15 MIL/uL (ref 3.87–5.11)
RDW: 12 % (ref 11.5–15.5)
WBC: 9.2 10*3/uL (ref 4.0–10.5)
nRBC: 0 % (ref 0.0–0.2)

## 2022-10-19 LAB — COMPREHENSIVE METABOLIC PANEL
ALT: 29 U/L (ref 0–44)
AST: 82 U/L — ABNORMAL HIGH (ref 15–41)
Albumin: 2.5 g/dL — ABNORMAL LOW (ref 3.5–5.0)
Alkaline Phosphatase: 57 U/L (ref 38–126)
Anion gap: 10 (ref 5–15)
BUN: <5 mg/dL — ABNORMAL LOW (ref 6–20)
CO2: 25 mmol/L (ref 22–32)
Calcium: 8.2 mg/dL — ABNORMAL LOW (ref 8.9–10.3)
Chloride: 101 mmol/L (ref 98–111)
Creatinine, Ser: 0.74 mg/dL (ref 0.44–1.00)
GFR, Estimated: >60 mL/min (ref >60)
Glucose, Bld: 93 mg/dL (ref 70–99)
Potassium: 3.3 mmol/L — ABNORMAL LOW (ref 3.5–5.1)
Sodium: 136 mmol/L (ref 135–145)
Total Bilirubin: 1.1 mg/dL (ref 0.3–1.2)
Total Protein: 5.5 g/dL — ABNORMAL LOW (ref 6.5–8.1)

## 2022-10-19 LAB — CK: Total CK: 2437 U/L — ABNORMAL HIGH (ref 38–234)

## 2022-10-19 MED ORDER — TRAZODONE HCL 50 MG PO TABS
25.0000 mg | ORAL_TABLET | Freq: Every evening | ORAL | Status: DC | PRN
  Administered 2022-10-23 – 2022-11-10 (×19): 25 mg via ORAL
  Filled 2022-10-19 (×20): qty 1

## 2022-10-19 MED ORDER — LOPERAMIDE HCL 2 MG PO CAPS
2.0000 mg | ORAL_CAPSULE | Freq: Once | ORAL | Status: AC
  Administered 2022-10-19: 2 mg via ORAL
  Filled 2022-10-19: qty 1

## 2022-10-19 MED ORDER — POTASSIUM CHLORIDE CRYS ER 20 MEQ PO TBCR
40.0000 meq | EXTENDED_RELEASE_TABLET | Freq: Once | ORAL | Status: AC
  Administered 2022-10-19: 40 meq via ORAL
  Filled 2022-10-19: qty 2

## 2022-10-19 MED ORDER — LOPERAMIDE HCL 2 MG PO CAPS
4.0000 mg | ORAL_CAPSULE | ORAL | Status: DC | PRN
  Administered 2022-10-19 – 2022-10-24 (×9): 4 mg via ORAL
  Filled 2022-10-19 (×10): qty 2

## 2022-10-19 NOTE — Progress Notes (Signed)
[facility] J. Daniel NP notified via chat that patient had another episode of diarrhea. New order received

## 2022-10-19 NOTE — Plan of Care (Signed)

## 2022-10-19 NOTE — Progress Notes (Signed)
PROGRESS NOTE    [name]  WGN:[id number] DOB: 1986-01-24 DOA: 10/14/2022 PCP: Salli Real, MD   Brief Narrative:  Carmen Daniels is a 37 y.o. female with medical history significant of bipolar, substance abuse disorder, prior overdose and questionable suicidal ideation at outside facility.  She presents to the ED after being found down by family.  Nearby there was noted to vomit and multiple medications.  Estimated downtime approximately 20 hours.  Given atypical labs, concern for overdose and subsequent withdrawal hospitalist was called for admission.  Psychiatry was called in consult.  Assessment & Plan:   Principal Problem:   Rhabdomyolysis Active Problems:   Bipolar disorder (HCC)   AMS (altered mental status)   Non-traumatic rhabdomyolysis   Encephalopathy, toxic  Acute toxic encephalopathy, POA At risk for polysubstance abuse Concern for overdose, incidental versus intentional -Multidrug overdose given UDS pan positive other than barbiturates -Unclear what patient has access to, attempting to get list of her home medications -Continue to follow for detox, mental status improving with supportive care -Psychiatry consulted for further evaluation, given patient's overdose, history of suicidal ideation and noted recent history of close friend's death she has agreed to voluntary commitment and inpatient psych once medically cleared. -Psychiatry initiating lumateperone 8/24  Rhabdomyolysis, nontraumatic, ongoing -CK >14k at intake -downtrending slowly but appropriately - will likely need an additional 24-48h of IVF to protect from end-organ damage. - Would like to see CK <1000 prior to discharging -Continue aggressive IV fluids, LR at 200 cc/h, urine output improving appropriately -Creatinine currently within normal limits, follow urine output, high risk for renal failure  High risk for withdrawals  -Given patient's UDS panel positive for benzos as well as opiates will follow  for possible withdrawals. -Avoid any sedating medications in the interim. -Phenobarbital taper initiated at intake  Diarrhea Hypokalemia Possibly secondary to opiate withdrawals but no other overt symptoms - treat symptomatically with imodium Replete K as appropriate  Lactic acidosis -Likely secondary to above complicated by rhabdomyolysis, dehydration, polysubstance abuse and overdose  DVT prophylaxis: enoxaparin (LOVENOX) injection 40 mg Start: 10/15/22 1000 SCDs Start: 10/15/22 0303 Code Status:   Code Status: Full Code Family Communication: None present  Status is: Inpatient  Dispo: The patient is from: Home              Anticipated d/c is to: Inpatient psych              Anticipated d/c date is: 48 to 72 hours              Patient currently not medically stable for discharge  Consultants:  Psychiatry  Procedures:  None  Antimicrobials:  None indicated  Subjective: No acute issues or events overnight other than some mild diarrhea.  Myalgias ongoing but improving.  Patient otherwise denies nausea vomiting constipation headache fevers chills chest pain  Objective: Vitals:   10/18/22 0821 10/18/22 1410 10/18/22 2100 10/19/22 0426  BP: 128/75 (!) 148/83 (!) 148/87 135/87  Pulse: 79 68 68 60  Resp:  18 16 17   Temp: 98.9 F (37.2 C) 98.9 F (37.2 C) 98.3 F (36.8 C) 98 F (36.7 C)  TempSrc: Oral  Oral Oral  SpO2: 98%  99% 100%  Weight:      Height:       No intake or output data in the 24 hours ending 10/19/22 0748  Filed Weights   10/15/22 0223  Weight: 79.3 kg    Examination:  General: Pleasantly resting in bed,  No acute distress. HEENT: Normocephalic atraumatic.  Sclerae nonicteric, noninjected.  Extraocular movements intact bilaterally. Neck: Without mass or deformity.  Trachea is midline. Lungs: Clear to auscultate bilaterally without rhonchi, wheeze, or rales. Heart: Regular rate and rhythm.  Without murmurs, rubs, or gallops. Abdomen: Soft,  nontender, nondistended.  Without guarding or rebound. Extremities: Without cyanosis, clubbing, edema, or obvious deformity. Skin: Multiple ecchymoses, wounds of various stages of healing over her face and arms  Data Reviewed: I have personally reviewed following labs and imaging studies  CBC: Recent Labs  Lab 10/15/22 0655 10/16/22 0351 10/17/22 1021 10/18/22 0221 10/19/22 0335  WBC 24.1* 17.5* 9.4 8.5 9.2  HGB 16.9* 15.9* 14.9 11.6* 12.3  HCT 49.0* 48.8* 44.9 34.6* 35.5*  MCV 82.1 87.5 87.4 85.0 85.5  PLT 423* 306 311 281 301   Basic Metabolic Panel: Recent Labs  Lab 10/15/22 0655 10/16/22 0351 10/17/22 1021 10/18/22 0221 10/19/22 0335  NA 135 132* 134* 133* 136  K 4.0 4.3 3.6 3.5 3.3*  CL 98 96* 97* 99 101  CO2 26 28 27 27 25   GLUCOSE 159* 119* 139* 89 93  BUN 12 11 6  5* <5*  CREATININE 0.85 0.80 0.84 0.67 0.74  CALCIUM 9.0 8.3* 8.6* 7.8* 8.2*   GFR: Estimated Creatinine Clearance: 106.6 mL/min (by C-G formula based on SCr of 0.74 mg/dL). Liver Function Tests: Recent Labs  Lab 10/16/22 0351 10/17/22 1021 10/18/22 0221 10/19/22 0335  AST 104* 113* 79* 82*  ALT 29 33 25 29  ALKPHOS 74 68 51 57  BILITOT 1.0 1.2 1.0 1.1  PROT 5.9* 6.2* 5.0* 5.5*  ALBUMIN 2.6* 2.9* 2.2* 2.5*   Recent Labs  Lab 10/14/22 2030  AMMONIA 13   Cardiac Enzymes: Recent Labs  Lab 10/15/22 0655 10/16/22 0346 10/17/22 1021 10/18/22 0221 10/19/22 0335  CKTOTAL 6,144* 4,306* 4,144* 2,627* 2,437*   Sepsis Labs: Recent Labs  Lab 10/14/22 2041 10/14/22 2250  LATICACIDVEN 2.1* 2.2*   Radiology Studies: No results found.  Scheduled Meds:  DULoxetine  120 mg Oral Daily   enoxaparin (LOVENOX) injection  40 mg Subcutaneous Q24H   folic acid  1 mg Oral Daily   lumateperone tosylate  42 mg Oral Daily   multivitamin with minerals  1 tablet Oral Daily   nicotine  21 mg Transdermal See admin instructions   PHENObarbital  32.5 mg Intravenous Q8H   saccharomyces boulardii  250  mg Oral BID   sodium chloride flush  10-40 mL Intracatheter Q12H   thiamine  100 mg Oral Daily   Or   thiamine  100 mg Intravenous Daily   Continuous Infusions:  lactated ringers 200 mL/hr at 10/19/22 0555     LOS: 4 days   Time spent:  Azucena Fallen, DO [facility]  If 7PM-7AM, please contact night-coverage [email or web address]  10/19/2022, 7:48 AM

## 2022-10-19 NOTE — Consult Note (Signed)
Addendum: I did review EKG yesterday and there was a large improvement in qtc.   [name] Consult Evaluation  Service Date: October 19, 2022 LOS:  LOS: 4 days    Primary Psychiatric Diagnoses  Bipolar I Disorder 2.  Borderline Personality Disorder 3.  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 female admitted medically on 10/14/2022  8:08 PM for being found missing and unconscious surrounded by vomit and pills. She carries the psychiatric diagnoses of bipolar I disorder, borderline personality disorder, chronic pain syndrome, and has a past medical history of migraines, seasonal allergies, asthma, ovarian cysts, pneumonia, past intentional overdose, allergies, seizures . She reports current drug use. She reports that she does not drink alcohol. Psychiatry was consulted for concern for suicide attempt, medication management by Azucena Fallen, MD  on 10/15/2022.    Her initial presentation of altered mental status is most consistent with a likely unintentional drug overdose. She meets criteria for drug overdose based on patient report, toxicology results. Current outpatient psychotropic medications include atogepant, beclomethasone, clonazepam, duloxetine, gabapentin, haloperidol, lumateperone tosylate, metoclopramide, oxycodone, sumatriptan, trazodone and historically she has had a mixed response to these medications. They are prescribed bothy by her outpt psych NP [name]) and PCP.  She was  compliant with medications prior to admission as evidenced by chart review, although there is some concern for abuse of klonopin in ED notes. On initial examination, patient was too somnolent for full exam - remained somnolent on re-eval 8/24 but was at least more interactive. Pt on subsequent exams has been pleasant and cooperative with the interviewer. She answers all questions to the best of her ability.  Please see plan below for detailed  recommendations.   Diagnoses:  Active Hospital problems: Principal Problem:   Rhabdomyolysis Active Problems:   Bipolar disorder (HCC)   AMS (altered mental status)   Non-traumatic rhabdomyolysis   Encephalopathy, toxic     Plan   ## Psychiatric Medication Recommendations:  -- Continue Cymbalta 120 mg (outpatient regimen) for depression -- Continue lumateperone tosylate (caplyta) for bipolar disorder -- Decrease trazodone 50 mg to 25 mg for sleep   ## Medical Decision Making Capacity:   Capacity was not formally addressed during this encounter; however, the patient gave clear responses to all questions and appeared to understand.  Patient appears to have capacity.  ## Further Work-up:  -- Continue daily EKG for 3 more days while here inpatient.  -- most recent EKG on 8/27 had QtC of 467.  -- Pertinent labwork reviewed earlier this admission includes: UDS (+amphetamines, benzos, opiates, cocaine, THC), elevated lactic acid, urinalysis (protein, bilirubin), elevated CK (Trend [phone]), elevated glucose Drug use: yes.   10/14/22 23:32  Amphetamines POSITIVE !  Barbiturates NONE DETECTED  Benzodiazepines POSITIVE !  Opiates POSITIVE !  COCAINE POSITIVE !  Tetrahydrocannabinol POSITIVE !     ## Disposition:  -- We recommend inpatient psychiatric hospitalization after medical hospitalization. Patient is under voluntary admission status at this time; please IVC if attempts to leave hospital.  Patient is aware of our intention and has voiced willingness to go voluntary to behavioral health hospitalization.  ## Behavioral / Environmental:  --   Patients with borderline personality traits/disorder often use the language of physical pain to communicate both physical and emotional suffering. It is important to address pain complaints as they arise and attempt to identify an etiology, either organic or psychiatric. In patients with chronic pain, it is important  to have a  discussion with the patient about expectations about pain control.    ## Safety and Observation Level:  - Based on my clinical evaluation, I estimate the patient to be at moderate risk of self harm in the current setting - At this time, we recommend a standard level of observation. This decision is based on my review of the chart including patient's history and current presentation, interview of the patient, mental status examination, and consideration of suicide risk including evaluating suicidal ideation, plan, intent, suicidal or self-harm behaviors, risk factors, and protective factors. This judgment is based on our ability to directly address suicide risk, implement suicide prevention strategies and develop a safety plan while the patient is in the clinical setting. Please contact our team if there is a concern that risk level has changed.  Suicide risk assessment  Patient has following modifiable risk factors for suicide: recklessness, which we are addressing by recommending psychiatric hospitalization after medical clearance.   Patient has following non-modifiable or demographic risk factors for suicide: history of suicide attempt, history of self harm behavior, and psychiatric hospitalization  Patient has the following protective factors against suicide: Supportive family, Supportive friends, and Cultural, spiritual, or religious beliefs that discourage suicide  Thank you for this consult request. Recommendations have been communicated to the primary team.  We will follow at this time.   Margaretmary Dys, MD  Psychiatric and Social History   Relevant Aspects of Hospital Course:  Admitted on 10/14/2022 for found down at home by boyfriend. The Patient's family found her on the floor around vomit and pills. She reported been down for around 20 hours. She had bruising and wounds on her head and side. She was arousable however was noted to have episodes of apnea. Patient's  family was concerned that she may be having withdrawal to fentanyl or benzos. Labs were obtained on presentation which were revealed WBC 24.4, hemoglobin 16.8, platelets 410, creatinine 1, CK 14,390, lactic acid 2.1, UDS positive for opioids, cocaine, benzos, amphetamines, THC. Chest x-ray showed no acute disease. X-ray of hip and ankle showed no evidence of fracture CT of spine showed no acute abnormalities patient was admitted for further workup. On admission she was difficult to arouse. .   Patient Report:   8/27: Pt seen at bedside.  Patient reported poor night of sleep and lingering grogginess.  We will consider reducing trazodone for tonight's bedtime medication.  Patient was in a better mood generally speaking than on previous days.  Patient made a few jokes and was able to verbalize all her needs.  Patient feels supported by her significant other Mr. Harlin Rain and mention that she had had another friend visit her yesterday.  Permission to call bf but not mother or sister.   Psychiatric ROS 08/27 Mood Symptoms Persistent sadness or low mood; loss of interest or pleasure in activities (anhedonia);  sleep disturbances (insomnia or hypersomnia); fatigue or loss of energy; feelings of worthlessness or excessive guilt; difficulty concentrating or making decisions; recurrent thoughts of death (specifically that of her friend who recently overdosed at her apartment) .  Manic Symptoms Patient politely answered questions about symptoms of mania but denied any recently.  Anxiety Symptoms Excessive anxiety and worry occurring more days than not; difficulty controlling the worry; restlessness or feeling keyed up or on edge; being easily fatigued; difficulty concentrating or mind going blank; irritability; muscle tension; sleep disturbance (difficulty falling or staying asleep, or restless, unsatisfying sleep); nausea or abdominal distress;  Trauma Symptoms Exposure to actual death (see above death of  friend); intrusive symptoms (e.g., flashbacks, distressing memories, or dreams); negative alterations in cognitions and mood (e.g., inability to remember aspects of the trauma, persistent negative beliefs, or emotional numbing); marked alterations in arousal and reactivity (sleep disturbance)  Psychosis Symptoms Denies.   Collateral information:  Attempted to call eric seel (boyfriend) at # in chart; VM not set up so no message left.   Psychiatric History:  Information collected from chart review  Prev Dx/Sx: bipolar 1 disorder, borderline personality disorder, polysubstance use disorder, social anxiety disorder, anxiety, depression, chronic pain, seizures Current Psych Provider: Shanna Cisco, NP (Nurse Practitioner)  Current Meds: Cymbalta 120 mg daily, Caplyta 42 mg daily, Haldol 2 mg nightly, and trazodone 100 mg nightly as needed.  Patient is also prescribed Ambien 10 mg nightly,  gabapentin 400 mg 4 times daily, and Klonopin 1 mg 4 times daily  Previous Med Trials: Long medical history, most psychotropics have been attempted Therapy: Yes  Prior ECT: None Prior Psych Hospitalization: [facility]  05/07/2022, [facility]  03/14/2021, [facility] 2019, [facility] 2009 Prior Self Harm: Past suicidal attempts: 2023, 2021, past self harm, Prior Violence: yes  Family Psych History:Depression in her paternal grandmother;  Family Hx suicide: None found on chart review  Social History:  Developmental Hx: Could not assess due to patient sleepiness Educational Hx: Could not assess due to patient sleepiness Occupational Hx: Could not assess due to patient sleepiness Legal Hx: Could not assess due to patient sleepiness Living Situation: Lives with significant other Spiritual Hx: Could not assess due to patient sleepiness Access to weapons: Must assess prior to d/c  Tobacco use: yes, 1 ppd Alcohol use: No  8/23: BF in the room reports that she has been using  fentanyl recreationally as well as takes a large amount of Klonopin which is prescribed to her, however she has been out of fentanyl for 2 days and not been taking her benzos, concern for acute opioid as well as benzodiazepine withdrawal.   Exam Findings   Psychiatric Specialty Exam:  Presentation  General Appearance: Other (comment) (Patient is dressed casually in hospital gown.  Patient has numerous neck chest and facial tattoos.  According to patient all her tattoos have special meaning. she cited several shops locally as having particularly talented artists)  Community education officer and Coherent; Normal Rate  Speech Volume:Normal  Handedness:Right   Mood and Affect  Mood:Depressed  Affect:Congruent; Depressed   Thought Process  Thought Processes:Coherent; Goal Directed; Linear  Descriptions of Associations:Intact  Orientation:Full (Time, Place and Person)  Thought Content:Logical  History of Schizophrenia/Schizoaffective disorder:No data recorded Duration of Psychotic Symptoms:No data recorded Hallucinations:Hallucinations: None   Ideas of Reference:None  Suicidal Thoughts:Suicidal Thoughts: No   Homicidal Thoughts:Homicidal Thoughts: No    Sensorium  Memory:Immediate Fair; Recent Good; Remote Good  Judgment:Fair  Insight:Fair   Executive Functions  Concentration:Good  Attention Span:Good  Recall:Fair  Fund of Knowledge:Good  Language:Good   Psychomotor Activity  Psychomotor Activity:Psychomotor Activity: Normal    Assets  Assets:Intimacy; Housing   Sleep  Sleep:Sleep: Poor     Physical Exam: Vital signs:  Temp:  [98 F (36.7 C)-98.3 F (36.8 C)] 98.1 F (36.7 C) (08/27 0935) Pulse Rate:  [59-68] 59 (08/27 0935) Resp:  [16-17] 17 (08/27 0935) BP: (135-148)/(80-87) 143/80 (08/27 0935) SpO2:  [99 %-100 %] 100 % (08/27 0935)  Physical Exam Vitals and nursing note reviewed.  Constitutional:  Appearance: She is  ill-appearing.  Pulmonary:     Effort: Pulmonary effort is normal.  Musculoskeletal:        General: Signs of injury present.  Skin:    Findings: Bruising present.     Blood pressure (!) 143/80, pulse (!) 59, temperature 98.1 F (36.7 C), temperature source Oral, resp. rate 17, height 5\' 11"  (1.803 m), weight 79.3 kg, SpO2 100%. Body mass index is 24.38 kg/m.   Other History   These have been pulled in through the EMR, reviewed, and updated if appropriate.   Family History:   The patient's family history includes Arthritis in her maternal grandfather and maternal grandmother; Asthma in an other family member; COPD in an other family member; Cancer in an other family member; Depression in her paternal grandmother; Healthy in her father and mother; Hyperlipidemia in an other family member; Hypertension in an other family member; Stroke in an other family member.  Medical History: Past Medical History:  Diagnosis Date   Allergic rhinitis    Anxiety    Arthritis    Asthma    Bipolar disorder (HCC)    Borderline personality disorder (HCC)    Bupropion overdose    Chronic pain syndrome    Depression    Family history of adverse reaction to anesthesia    Mom - N/V   Fibromyalgia    generalized   Hallucinations    02/08/22- have cleared mom said.   Migraines    Nasal polyps    OCD (obsessive compulsive disorder)    Ovarian cyst    left   Pneumonia    2021   PONV (postoperative nausea and vomiting)    Schizophrenia (HCC)    schizoaffective   Seizures (HCC)    patient attenped sudide by tak alot of her medication,.    Surgical History: Past Surgical History:  Procedure Laterality Date   ABDOMINAL HYSTERECTOMY     LAPAROSCOPIC TUBAL LIGATION Bilateral 06/13/2019   Procedure: LAPAROSCOPIC TUBAL LIGATION;  Surgeon: Hermina Staggers, MD;  Location: [facility];  Service: Gynecology;  Laterality: Bilateral;  FILSHIE CLIPS   NASAL SINUS SURGERY     ORIF ANKLE  FRACTURE Right 02/09/2022   Procedure: OPEN TREATMENT OF RIGHT BIMALLEOLAR ANKLE FRACTURE;  Surgeon: Terance Hart, MD;  Location: [facility] OR;  Service: Orthopedics;  Laterality: Right;   TUBAL LIGATION     tubes in ears     VAGINAL HYSTERECTOMY Bilateral 01/15/2020   Procedure: HYSTERECTOMY VAGINAL;  Surgeon: Hermina Staggers, MD;  Location: [facility] OR;  Service: Gynecology;  Laterality: Bilateral;   WISDOM TOOTH EXTRACTION      Medications:   Current Facility-Administered Medications:    acetaminophen (TYLENOL) tablet 650 mg, 650 mg, Oral, Q6H PRN, 650 mg at 10/18/22 1356 **OR** acetaminophen (TYLENOL) suppository 650 mg, 650 mg, Rectal, Q6H PRN, Dorrell, Robert, MD   alum & mag hydroxide-simeth (MAALOX/MYLANTA) 200-200-20 MG/5ML suspension 30 mL, 30 mL, Oral, Q4H PRN, Azucena Fallen, MD, 30 mL at 10/18/22 2318   DULoxetine (CYMBALTA) DR capsule 120 mg, 120 mg, Oral, Daily, Margaretmary Dys, MD, 120 mg at 10/19/22 0905   enoxaparin (LOVENOX) injection 40 mg, 40 mg, Subcutaneous, Q24H, Dorrell, Robert, MD, 40 mg at 10/19/22 8657   folic acid (FOLVITE) tablet 1 mg, 1 mg, Oral, Daily, Dorrell, Robert, MD, 1 mg at 10/19/22 0905   ibuprofen (ADVIL) tablet 600 mg, 600 mg, Oral, Q4H PRN, Azucena Fallen, MD, 600 mg at 10/19/22 [phone]  lactated ringers infusion, , Intravenous, Continuous, Dorrell, Robert, MD, Last Rate: 200 mL/hr at 10/19/22 1104, New Bag at 10/19/22 1104   loperamide (IMODIUM) capsule 4 mg, 4 mg, Oral, PRN, Azucena Fallen, MD   lumateperone tosylate (CAPLYTA) capsule 42 mg, 42 mg, Oral, Daily, Cinderella, Margaret A, 42 mg at 10/19/22 1610   multivitamin with minerals tablet 1 tablet, 1 tablet, Oral, Daily, Dorrell, Robert, MD, 1 tablet at 10/19/22 9604   nicotine (NICODERM CQ - dosed in mg/24 hours) patch 21 mg, 21 mg, Transdermal, See admin instructions, Cinderella, Margaret A, 21 mg at 10/19/22 0556   ondansetron (ZOFRAN) tablet 4 mg, 4 mg, Oral, Q6H  PRN, 4 mg at 10/18/22 0936 **OR** ondansetron (ZOFRAN) injection 4 mg, 4 mg, Intravenous, Q6H PRN, Dorrell, Robert, MD, 4 mg at 10/18/22 2106   [COMPLETED] PHENObarbital (LUMINAL) injection 97.5 mg, 97.5 mg, Intravenous, Q8H, 97.5 mg at 10/16/22 2249 **FOLLOWED BY** [COMPLETED] PHENObarbital (LUMINAL) injection 65 mg, 65 mg, Intravenous, Q8H, 65 mg at 10/18/22 2113 **FOLLOWED BY** PHENObarbital (LUMINAL) injection 32.5 mg, 32.5 mg, Intravenous, Q8H, Dorrell, Robert, MD, 32.5 mg at 10/19/22 0911   phenol (CHLORASEPTIC) mouth spray 1 spray, 1 spray, Mouth/Throat, PRN, Azucena Fallen, MD   sodium chloride flush (NS) 0.9 % injection 10-40 mL, 10-40 mL, Intracatheter, Q12H, Azucena Fallen, MD, 10 mL at 10/18/22 5409   sodium chloride flush (NS) 0.9 % injection 10-40 mL, 10-40 mL, Intracatheter, PRN, Azucena Fallen, MD   thiamine (VITAMIN B1) tablet 100 mg, 100 mg, Oral, Daily, 100 mg at 10/19/22 0905 **OR** thiamine (VITAMIN B1) injection 100 mg, 100 mg, Intravenous, Daily, Dorrell, Robert, MD   traZODone (DESYREL) tablet 50 mg, 50 mg, Oral, QHS PRN, Margaretmary Dys, MD, 50 mg at 10/18/22 2349  Allergies: Allergies  Allergen Reactions   Keflex [Cephalexin] Anaphylaxis    Has tolerated amoxicillin 2019/2020

## 2022-10-20 ENCOUNTER — Telehealth (HOSPITAL_COMMUNITY): Payer: Self-pay | Admitting: Psychiatry

## 2022-10-20 DIAGNOSIS — F1910 Other psychoactive substance abuse, uncomplicated: Secondary | ICD-10-CM

## 2022-10-20 DIAGNOSIS — T796XXD Traumatic ischemia of muscle, subsequent encounter: Secondary | ICD-10-CM | POA: Diagnosis not present

## 2022-10-20 DIAGNOSIS — F3189 Other bipolar disorder: Secondary | ICD-10-CM | POA: Diagnosis not present

## 2022-10-20 LAB — COMPREHENSIVE METABOLIC PANEL
ALT: 30 U/L (ref 0–44)
AST: 68 U/L — ABNORMAL HIGH (ref 15–41)
Albumin: 2.5 g/dL — ABNORMAL LOW (ref 3.5–5.0)
Alkaline Phosphatase: 52 U/L (ref 38–126)
Anion gap: 12 (ref 5–15)
BUN: <5 mg/dL — ABNORMAL LOW (ref 6–20)
CO2: 22 mmol/L (ref 22–32)
Calcium: 7.8 mg/dL — ABNORMAL LOW (ref 8.9–10.3)
Chloride: 102 mmol/L (ref 98–111)
Creatinine, Ser: 0.55 mg/dL (ref 0.44–1.00)
GFR, Estimated: >60 mL/min (ref >60)
Glucose, Bld: 103 mg/dL — ABNORMAL HIGH (ref 70–99)
Potassium: 3 mmol/L — ABNORMAL LOW (ref 3.5–5.1)
Sodium: 136 mmol/L (ref 135–145)
Total Bilirubin: 1.4 mg/dL — ABNORMAL HIGH (ref 0.3–1.2)
Total Protein: 4.9 g/dL — ABNORMAL LOW (ref 6.5–8.1)

## 2022-10-20 LAB — CBC
HCT: 33.3 % — ABNORMAL LOW (ref 36.0–46.0)
Hemoglobin: 11.3 g/dL — ABNORMAL LOW (ref 12.0–15.0)
MCH: 28.3 pg (ref 26.0–34.0)
MCHC: 33.9 g/dL (ref 30.0–36.0)
MCV: 83.5 fL (ref 80.0–100.0)
Platelets: 300 10*3/uL (ref 150–400)
RBC: 3.99 MIL/uL (ref 3.87–5.11)
RDW: 12.1 % (ref 11.5–15.5)
WBC: 9.8 10*3/uL (ref 4.0–10.5)
nRBC: 0 % (ref 0.0–0.2)

## 2022-10-20 LAB — GLUCOSE, CAPILLARY: Glucose-Capillary: 91 mg/dL (ref 70–99)

## 2022-10-20 LAB — PHOSPHORUS: Phosphorus: 2.4 mg/dL — ABNORMAL LOW (ref 2.5–4.6)

## 2022-10-20 LAB — MAGNESIUM: Magnesium: 1.5 mg/dL — ABNORMAL LOW (ref 1.7–2.4)

## 2022-10-20 LAB — CK: Total CK: 1603 U/L — ABNORMAL HIGH (ref 38–234)

## 2022-10-20 MED ORDER — GABAPENTIN 100 MG PO CAPS
200.0000 mg | ORAL_CAPSULE | Freq: Three times a day (TID) | ORAL | Status: DC
  Administered 2022-10-20 – 2022-10-21 (×4): 200 mg via ORAL
  Filled 2022-10-20 (×4): qty 2

## 2022-10-20 MED ORDER — POTASSIUM CHLORIDE 10 MEQ/100ML IV SOLN
10.0000 meq | INTRAVENOUS | Status: AC
  Administered 2022-10-20 (×4): 10 meq via INTRAVENOUS
  Filled 2022-10-20 (×4): qty 100

## 2022-10-20 MED ORDER — SODIUM CHLORIDE 0.9 % IV SOLN
INTRAVENOUS | Status: DC
Start: 2022-10-20 — End: 2022-10-20

## 2022-10-20 MED ORDER — METOPROLOL TARTRATE 5 MG/5ML IV SOLN
5.0000 mg | INTRAVENOUS | Status: DC | PRN
  Filled 2022-10-20: qty 5

## 2022-10-20 MED ORDER — HYDRALAZINE HCL 20 MG/ML IJ SOLN: 10.0000 mg | INTRAMUSCULAR | Status: DC | PRN

## 2022-10-20 MED ORDER — K PHOS MONO-SOD PHOS DI & MONO 155-852-130 MG PO TABS
500.0000 mg | ORAL_TABLET | Freq: Two times a day (BID) | ORAL | Status: DC
  Administered 2022-10-20 (×2): 500 mg via ORAL
  Filled 2022-10-20 (×2): qty 2

## 2022-10-20 MED ORDER — GUAIFENESIN 100 MG/5ML PO LIQD: 5.0000 mL | ORAL | Status: DC | PRN

## 2022-10-20 MED ORDER — LACTATED RINGERS IV SOLN: INTRAVENOUS | Status: DC

## 2022-10-20 MED ORDER — POTASSIUM CHLORIDE CRYS ER 20 MEQ PO TBCR
40.0000 meq | EXTENDED_RELEASE_TABLET | Freq: Once | ORAL | Status: AC
  Administered 2022-10-20: 40 meq via ORAL
  Filled 2022-10-20: qty 2

## 2022-10-20 MED ORDER — MUPIROCIN 2 % EX OINT
TOPICAL_OINTMENT | Freq: Two times a day (BID) | CUTANEOUS | Status: DC
  Administered 2022-10-26 – 2022-11-05 (×3): 1 via NASAL
  Filled 2022-10-20 (×5): qty 22

## 2022-10-20 MED ORDER — IPRATROPIUM-ALBUTEROL 0.5-2.5 (3) MG/3ML IN SOLN: 3.0000 mL | RESPIRATORY_TRACT | Status: DC | PRN

## 2022-10-20 MED ORDER — MAGNESIUM SULFATE 2 GM/50ML IV SOLN
2.0000 g | Freq: Once | INTRAVENOUS | Status: AC
  Administered 2022-10-20: 2 g via INTRAVENOUS
  Filled 2022-10-20: qty 50

## 2022-10-20 MED ORDER — SENNOSIDES-DOCUSATE SODIUM 8.6-50 MG PO TABS: 1.0000 | ORAL_TABLET | Freq: Every evening | ORAL | Status: DC | PRN

## 2022-10-20 NOTE — Progress Notes (Signed)
PROGRESS NOTE    Carmen Daniels  CZY:[id number] DOB: Oct 05, 1985 DOA: 10/14/2022 PCP: Salli Real, MD     Brief Narrative:  Carmen Daniels is a 37 y.o. female with medical history significant of bipolar, substance abuse disorder, prior overdose and questionable suicidal ideation at outside facility.  She presents to the ED after being found down by family.  Nearby there was noted to vomit and multiple medications.  Estimated downtime approximately 20 hours.  Given atypical labs, concern for overdose and subsequent withdrawal hospitalist was called for admission.  Psychiatry was called in consult.  Upon admission she was noted to have acute toxic metabolic encephalopathy and severe rhabdomyolysis.  Patient was started on aggressive IV fluids which slowly improved her CK levels.  During hospitalization also required electrolyte repletion.  Patient was seen by psychiatry who is recommending inpatient psychiatry once medically cleared.   Assessment & Plan:   Principal Problem:   Rhabdomyolysis Active Problems:   Bipolar disorder (HCC)   AMS (altered mental status)   Non-traumatic rhabdomyolysis   Encephalopathy, toxic   Acute toxic encephalopathy, POA At risk for polysubstance abuse Concern for overdose, incidental versus intentional Secondary to multidrug overdose.  Continue to follow detox protocol.  Patient seen by psychiatry, will require inpatient psych once medically cleared.  Patient started on Caplyta   Rhabdomyolysis, nontraumatic, ongoing Known traumatic, CK levels are trending down.  Continue IV fluids  Right lower extremity erythema/mild fluid-filled lesion - Wonder if this is just superficial infection.  Will order mupirocin ointment.   High risk for withdrawals  -Given patient's UDS panel positive for benzos as well as opiates will follow for possible withdrawals. -Avoid any sedating medications in the interim. -Phenobarbital taper initiated at intake   Diarrhea,  resolved Hypokalemia/hypophosphatemia/hypomagnesemia Aggressive repletion.     Lactic acidosis Improved with fluids   DVT prophylaxis: Lovenox Code Status:   Code Status: Full Code Family Communication: None present   Status is: Inpatient Hopefully discharge in next 24 hours once electrolytes and CK levels are more stable   Consultants:  Psychiatry   Procedures:  None              Diet Orders (From admission, onward)     Start     Ordered   10/15/22 1554  Diet regular Room service appropriate? Yes; Fluid consistency: Thin  Diet effective now       Comments: Pt vegetarian  Question Answer Comment  Room service appropriate? Yes   Fluid consistency: Thin      10/15/22 1553            Subjective: Seen at bedside, no complaints Overall still feeling weak.  Some right lower extremity tingling.  But denies any back discomfort or pain. Tells me she usually snorts heroin  Examination:  General exam: Appears calm and comfortable  Respiratory system: Clear to auscultation. Respiratory effort normal. Cardiovascular system: S1 & S2 heard, RRR. No JVD, murmurs, rubs, gallops or clicks. No pedal edema. Gastrointestinal system: Abdomen is nondistended, soft and nontender. No organomegaly or masses felt. Normal bowel sounds heard. Central nervous system: Alert and oriented. No focal neurological deficits. Extremities: Symmetric 5 x 5 power. Skin: Right lower extremity erythematous lesion with very small superficial fluid-filled bulla Psychiatry: Judgement and insight appear normal. Mood & affect appropriate.  Objective: Vitals:   10/19/22 0935 10/19/22 1435 10/20/22 0438 10/20/22 0825  BP: (!) 143/80 (!) 143/86 (!) 144/91 (!) 141/81  Pulse: (!) 59 80 72 66  Resp:  17 16 16 16   Temp: 98.1 F (36.7 C) 98.2 F (36.8 C) 98 F (36.7 C) 98.7 F (37.1 C)  TempSrc: Oral Oral Oral Oral  SpO2: 100% 99% 100% 100%  Weight:      Height:        Intake/Output Summary  (Last 24 hours) at 10/20/2022 1256 Last data filed at 10/20/2022 1610 Gross per 24 hour  Intake 240 ml  Output 4602 ml  Net -4362 ml   Filed Weights   10/15/22 0223  Weight: 79.3 kg    Scheduled Meds:  DULoxetine  120 mg Oral Daily   enoxaparin (LOVENOX) injection  40 mg Subcutaneous Q24H   folic acid  1 mg Oral Daily   gabapentin  200 mg Oral TID   lumateperone tosylate  42 mg Oral Daily   multivitamin with minerals  1 tablet Oral Daily   mupirocin ointment   Nasal BID   nicotine  21 mg Transdermal See admin instructions   PHENObarbital  32.5 mg Intravenous Q8H   phosphorus  500 mg Oral BID   sodium chloride flush  10-40 mL Intracatheter Q12H   thiamine  100 mg Oral Daily   Or   thiamine  100 mg Intravenous Daily   Continuous Infusions:  lactated ringers 150 mL/hr at 10/20/22 1002   magnesium sulfate bolus IVPB     potassium chloride 10 mEq (10/20/22 1244)    Nutritional status     Body mass index is 24.38 kg/m.  Data Reviewed:   CBC: Recent Labs  Lab 10/16/22 0351 10/17/22 1021 10/18/22 0221 10/19/22 0335 10/20/22 0233  WBC 17.5* 9.4 8.5 9.2 9.8  HGB 15.9* 14.9 11.6* 12.3 11.3*  HCT 48.8* 44.9 34.6* 35.5* 33.3*  MCV 87.5 87.4 85.0 85.5 83.5  PLT 306 311 281 301 300   Basic Metabolic Panel: Recent Labs  Lab 10/16/22 0351 10/17/22 1021 10/18/22 0221 10/19/22 0335 10/20/22 0232 10/20/22 0233  NA 132* 134* 133* 136  --  136  K 4.3 3.6 3.5 3.3*  --  3.0*  CL 96* 97* 99 101  --  102  CO2 28 27 27 25   --  22  GLUCOSE 119* 139* 89 93  --  103*  BUN 11 6 5* <5*  --  <5*  CREATININE 0.80 0.84 0.67 0.74  --  0.55  CALCIUM 8.3* 8.6* 7.8* 8.2*  --  7.8*  MG  --   --   --   --  1.5*  --   PHOS  --   --   --   --  2.4*  --    GFR: Estimated Creatinine Clearance: 106.6 mL/min (by C-G formula based on SCr of 0.55 mg/dL). Liver Function Tests: Recent Labs  Lab 10/16/22 0351 10/17/22 1021 10/18/22 0221 10/19/22 0335 10/20/22 0233  AST 104* 113* 79*  82* 68*  ALT 29 33 25 29 30   ALKPHOS 74 68 51 57 52  BILITOT 1.0 1.2 1.0 1.1 1.4*  PROT 5.9* 6.2* 5.0* 5.5* 4.9*  ALBUMIN 2.6* 2.9* 2.2* 2.5* 2.5*   No results for input(s): "LIPASE", "AMYLASE" in the last 168 hours. Recent Labs  Lab 10/14/22 2030  AMMONIA 13   Coagulation Profile: No results for input(s): "INR", "PROTIME" in the last 168 hours. Cardiac Enzymes: Recent Labs  Lab 10/16/22 0346 10/17/22 1021 10/18/22 0221 10/19/22 0335 10/20/22 0233  CKTOTAL 4,306* 4,144* 2,627* 2,437* 1,603*   BNP (last 3 results) No results for input(s): "PROBNP" in the last 8760  hours. HbA1C: No results for input(s): "HGBA1C" in the last 72 hours. CBG: Recent Labs  Lab 10/14/22 2018 10/20/22 1132  GLUCAP 130* 91   Lipid Profile: No results for input(s): "CHOL", "HDL", "LDLCALC", "TRIG", "CHOLHDL", "LDLDIRECT" in the last 72 hours. Thyroid Function Tests: No results for input(s): "TSH", "T4TOTAL", "FREET4", "T3FREE", "THYROIDAB" in the last 72 hours. Anemia Panel: No results for input(s): "VITAMINB12", "FOLATE", "FERRITIN", "TIBC", "IRON", "RETICCTPCT" in the last 72 hours. Sepsis Labs: Recent Labs  Lab 10/14/22 2041 10/14/22 2250  LATICACIDVEN 2.1* 2.2*    No results found for this or any previous visit (from the past 240 hour(s)).       Radiology Studies: No results found.         LOS: 5 days   Time spent= 35 mins    Miguel Rota, MD [facility]  If 7PM-7AM, please contact night-coverage  10/20/2022, 12:56 PM

## 2022-10-20 NOTE — Hospital Course (Addendum)
  Brief Narrative:  Carmen Daniels is a 37 y.o. female with medical history significant of bipolar, substance abuse disorder, prior overdose and questionable suicidal ideation at outside facility.  She presents to the ED after being found down by family.  Nearby there was noted to vomit and multiple medications.  Estimated downtime approximately 20 hours.  Given atypical labs, concern for overdose and subsequent withdrawal hospitalist was called for admission.  Psychiatry was called in consult.  Upon admission she was noted to have acute toxic metabolic encephalopathy and severe rhabdomyolysis.  Patient was started on aggressive IV fluids which slowly improved her CK levels.  During hospitalization also required electrolyte repletion.  Patient was seen by psychiatry who is recommending inpatient psychiatry once medically cleared. Will receive 7 days of p.o. doxycycline for multiple sites of small cellulitis. There is concerns of right lower extremity peroneal nerve palsy.  AFO boot ordered. Today patient is medically cleared to be discharged with recommendations as stated above.   Assessment & Plan:   Principal Problem:   Rhabdomyolysis Active Problems:   Bipolar disorder (HCC)   AMS (altered mental status)   Non-traumatic rhabdomyolysis   Encephalopathy, toxic   Acute toxic encephalopathy, POA At risk for polysubstance abuse Concern for overdose, incidental versus intentional Secondary to multidrug overdose.  Transition to inpatient psych.  BH declined at first, thereafter no bed available. Case has been faxed out to other psych facilities.    Rhabdomyolysis, nontraumatic, ongoing CK levels have not trended down.  Right lower extremity erythema/mild fluid-filled lesion RLE palsy, right lower extremity peroneal palsy/compression - Neuropathic pain of right lower extremity on Cymbalta and Neurontin.  There is concerns of possible superficial infection therefore mupirocin ointment and  doxycycline for total of 7 days. - MRI of lumbar spine shows chronic lumbar arthropathy.  Follow-up outpatient -May benefit from outpatient EMG Previously seen Dr Susa Simmonds from ortho for Bimalleolar fx in Dec 2023, Will see if she can benefit from orthotic to dorsiflex her foot. Will order AFO brace, or we can change to lace up if needed to avoid prolong dorsiflex and chronic contracture. Spoke with Dr Susa Simmonds.  PT/OT and rehab eval- not a CIR candidate.    High risk for withdrawals  No longer showing any signs of withdrawal   Diarrhea, resolved Hypokalemia/hypophosphatemia/hypomagnesemia Aggressive repletion.     Lactic acidosis Improved with fluids   DVT prophylaxis: Lovenox Code Status:    Full Code Family Communication: Carmen Daniels updated at bedside and later over the phone.    Status is: Inpatient Pending Psych Placement.    Consultants:  Psychiatry   Procedures:  None

## 2022-10-20 NOTE — Plan of Care (Signed)

## 2022-10-20 NOTE — Consult Note (Signed)
Addendum: I did review EKG yesterday and there was a large improvement in qtc.   [name] Consult Evaluation  Service Date: October 20, 2022 LOS:  LOS: 5 days    Primary Psychiatric Diagnoses  Bipolar I Disorder 2.  Borderline Personality Disorder 3.  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 female admitted medically on 10/14/2022  8:08 PM for being found missing and unconscious surrounded by vomit and pills. She carries the psychiatric diagnoses of bipolar I disorder, borderline personality disorder, chronic pain syndrome, and has a past medical history of migraines, seasonal allergies, asthma, ovarian cysts, pneumonia, past intentional overdose, allergies, seizures . She reports current drug use. She reports that she does not drink alcohol. Psychiatry was consulted for concern for suicide attempt, medication management by Azucena Fallen, MD  on 10/15/2022.   Her initial presentation of altered mental status is most consistent with a likely unintentional drug overdose. She meets criteria for drug overdose based on patient report, toxicology results. Current outpatient psychotropic medications include atogepant, beclomethasone, clonazepam, duloxetine, gabapentin, haloperidol, lumateperone tosylate, metoclopramide, oxycodone, sumatriptan, trazodone and historically she has had a mixed response to these medications. They are prescribed bothy by her outpt psych NP [name]) and PCP.  She was  compliant with medications prior to admission as evidenced by chart review, although there is some concern for abuse of klonopin in ED notes. On initial examination, patient was too somnolent for full exam - remained somnolent on re-eval 8/24 but was at least more interactive. Pt on subsequent exams has been pleasant and cooperative with the interviewer. She answers all questions to the best of her ability.   Please see plan below for detailed  recommendations.   Diagnoses:  Active Hospital problems: Principal Problem:   Rhabdomyolysis Active Problems:   Bipolar disorder (HCC)   AMS (altered mental status)   Non-traumatic rhabdomyolysis   Encephalopathy, toxic     Plan   ## Psychiatric Medication Recommendations:  -- Continue Cymbalta 120 mg (outpatient regimen) for depression, neuropathic pain -- Continue lumateperone tosylate (caplyta) for bipolar disorder -- Continue trazodone 25 mg for sleep -- Start gabapentin 200 mg TID for neuropathic pain, anxiety  ## Medical Decision Making Capacity:   Capacity was not formally addressed during this encounter; however, the patient gave clear responses to all questions and appeared to understand.  Patient appears to have capacity.  ## Further Work-up:  -- Ordered EKG for completion 8/28  -- most recent EKG on 8/28 had QtC of 471.  -- Pertinent labwork reviewed earlier this admission includes: UDS (+amphetamines, benzos, opiates, cocaine, THC), elevated lactic acid, urinalysis (protein, bilirubin), elevated CK (Trend [phone]), elevated glucose Drug use: yes.   10/14/22 23:32  Amphetamines POSITIVE !  Barbiturates NONE DETECTED  Benzodiazepines POSITIVE !  Opiates POSITIVE !  COCAINE POSITIVE !  Tetrahydrocannabinol POSITIVE !   ## Disposition:  -- We recommend inpatient psychiatric hospitalization after medical hospitalization. Patient is under voluntary admission status at this time; please IVC if attempts to leave hospital.  Patient is aware of our intention and has voiced willingness to go voluntary to behavioral health hospitalization.  ## Behavioral / Environmental:  --   Patients with borderline personality traits/disorder often use the language of physical pain to communicate both physical and emotional suffering. It is important to address pain complaints as they arise and attempt to identify an etiology, either organic or psychiatric. In patients with chronic  pain, it  is important to have a discussion with the patient about expectations about pain control.    ## Safety and Observation Level:  - Based on my clinical evaluation, I estimate the patient to be at moderate risk of self harm in the current setting - At this time, we recommend a standard level of observation. This decision is based on my review of the chart including patient's history and current presentation, interview of the patient, mental status examination, and consideration of suicide risk including evaluating suicidal ideation, plan, intent, suicidal or self-harm behaviors, risk factors, and protective factors. This judgment is based on our ability to directly address suicide risk, implement suicide prevention strategies and develop a safety plan while the patient is in the clinical setting. Please contact our team if there is a concern that risk level has changed.  Suicide risk assessment  Patient has following modifiable risk factors for suicide: recklessness, which we are addressing by recommending psychiatric hospitalization after medical clearance.   Patient has following non-modifiable or demographic risk factors for suicide: history of suicide attempt, history of self harm behavior, and psychiatric hospitalization  Patient has the following protective factors against suicide: Supportive family, Supportive friends, and Cultural, spiritual, or religious beliefs that discourage suicide  Thank you for this consult request. Recommendations have been communicated to the primary team.  We will follow at this time.   Margaretmary Dys, MD  Psychiatric and Social History   Relevant Aspects of Hospital Course:  Admitted on 10/14/2022 for found down at home by boyfriend. The Patient's family found her on the floor around vomit and pills. She reported been down for around 20 hours. She had bruising and wounds on her head and side. She was arousable however was noted to have  episodes of apnea. Patient's family was concerned that she may be having withdrawal to fentanyl or benzos. Labs were obtained on presentation which were revealed WBC 24.4, hemoglobin 16.8, platelets 410, creatinine 1, CK 14,390, lactic acid 2.1, UDS positive for opioids, cocaine, benzos, amphetamines, THC. Chest x-ray showed no acute disease. X-ray of hip and ankle showed no evidence of fracture CT of spine showed no acute abnormalities patient was admitted for further workup. On admission she was difficult to arouse.    Patient Report:   8/28: Pt seen at bedside. Patient reported poor night of sleep, but no lingering grogginess. Pt cited fairly intense neuropathic burning and tingling pain at the bilateral edema in her lower extremities. The Right foot is worse than her Left. Noticed some raised bullae on the wound on her R foot. Picture below, ignore incorrect caption of photo.   She has raised bullae also on her L foot. Bilaterally edematous. Will notify primary medical team. Offering her gabapentin for the neuropathic pain.  Pt has noted history of nasal polyps, chronic allergies, and asthma. Concerned for possible eosinophilic granulomatosis with polyangiitis (EGPA).  Other psych topics discussed:  - difficult relationship with her mother. Mother wrote her a hurtful letter. - family all flown to [place] for her sister's wedding, which she is sad to miss. Her illness has robbed her of many events in family life. - Pt has tried psychotherapy in the past, would like to restart DBT, as she felt it was the most effective thing she has ever tried.   Permission to call bf but not mother or sister.   Psychiatric ROS 08/28 Mood Symptoms Persistent sadness or low mood; loss of interest or pleasure in activities (anhedonia);  sleep  disturbances (insomnia or hypersomnia); fatigue or loss of energy; feelings of worthlessness or excessive guilt; difficulty concentrating or making decisions; recurrent  thoughts of death (specifically that of her friend who recently overdosed at her apartment) .  Manic Symptoms Patient politely answered questions about symptoms of mania but denied any recently.  Anxiety Symptoms Excessive anxiety and worry occurring more days than not; difficulty controlling the worry; restlessness or feeling keyed up or on edge; being easily fatigued; difficulty concentrating or mind going blank; irritability; muscle tension; sleep disturbance (difficulty falling or staying asleep, or restless, unsatisfying sleep); nausea or abdominal distress;   Trauma Symptoms Exposure to actual death (see above death of friend); intrusive symptoms (e.g., flashbacks, distressing memories, or dreams); negative alterations in cognitions and mood (e.g., inability to remember aspects of the trauma, persistent negative beliefs, or emotional numbing); marked alterations in arousal and reactivity (sleep disturbance)  Psychosis Symptoms Denies.   Collateral information:  Attempted to call eric seel (boyfriend) at # in chart; VM not set up so no message left.   Psychiatric History:  Information collected from chart review  Prev Dx/Sx: bipolar 1 disorder, borderline personality disorder, polysubstance use disorder, social anxiety disorder, anxiety, depression, chronic pain, seizures Current Psych Provider: Shanna Cisco, NP (Nurse Practitioner)  Current Meds: Cymbalta 120 mg daily, Caplyta 42 mg daily, Haldol 2 mg nightly, and trazodone 100 mg nightly as needed.  Patient is also prescribed Ambien 10 mg nightly,  gabapentin 400 mg 4 times daily, and Klonopin 1 mg 4 times daily  Previous Med Trials: Long medical history, most psychotropics have been attempted Therapy: Yes  Prior ECT: None Prior Psych Hospitalization: [facility]  05/07/2022, [facility]  03/14/2021, [facility] 2019, [facility] 2009 Prior Self Harm: Past suicidal attempts: 2023, 2021, past self  harm, Prior Violence: yes  Family Psych History:Depression in her paternal grandmother;  Family Hx suicide: None found on chart review  Social History:  Developmental Hx: Could not assess due to patient sleepiness Educational Hx: Could not assess due to patient sleepiness Occupational Hx: Could not assess due to patient sleepiness Legal Hx: Could not assess due to patient sleepiness Living Situation: Lives with significant other Spiritual Hx: Could not assess due to patient sleepiness Access to weapons: Must assess prior to d/c  Tobacco use: yes, 1 ppd Alcohol use: No  8/23: BF in the room reports that she has been using fentanyl recreationally as well as takes a large amount of Klonopin which is prescribed to her, however she has been out of fentanyl for 2 days and not been taking her benzos, concern for acute opioid as well as benzodiazepine withdrawal.   Exam Findings   Psychiatric Specialty Exam:  Presentation  General Appearance: Appropriate for Environment  Eye Contact:Good  Speech:Clear and Coherent; Normal Rate  Speech Volume:Normal  Handedness:Right   Mood and Affect  Mood:Depressed  Affect:Appropriate; Congruent   Thought Process  Thought Processes:Coherent; Goal Directed; Linear  Descriptions of Associations:Intact  Orientation:Full (Time, Place and Person)  Thought Content:Logical  History of Schizophrenia/Schizoaffective disorder:No data recorded Duration of Psychotic Symptoms:No data recorded Hallucinations:Hallucinations: None   Ideas of Reference:None  Suicidal Thoughts:Suicidal Thoughts: No   Homicidal Thoughts:Homicidal Thoughts: No    Sensorium  Memory:Immediate Good; Recent Good; Remote Good  Judgment:Fair  Insight:Fair   Executive Functions  Concentration:Good  Attention Span:Good  Recall:Good  Fund of Knowledge:Good  Language:Good   Psychomotor Activity  Psychomotor Activity:Psychomotor Activity:  Normal    Assets  Assets:Intimacy;  Housing   Sleep  Sleep:Sleep: Poor     Physical Exam: Vital signs:  Temp:  [98 F (36.7 C)-98.2 F (36.8 C)] 98 F (36.7 C) (08/28 0438) Pulse Rate:  [59-80] 72 (08/28 0438) Resp:  [16-17] 16 (08/28 0438) BP: (143-144)/(80-91) 144/91 (08/28 0438) SpO2:  [99 %-100 %] 100 % (08/28 0438)  Physical Exam Vitals and nursing note reviewed.  Constitutional:      Appearance: She is ill-appearing.  HENT:     Head: Normocephalic.  Pulmonary:     Effort: Pulmonary effort is normal.  Musculoskeletal:        General: Signs of injury present.     Right lower leg: Edema present.     Left lower leg: Edema present.  Skin:    Findings: Bruising present.  Neurological:     Mental Status: She is alert and oriented to person, place, and time.  Psychiatric:        Behavior: Behavior normal.     Blood pressure (!) 144/91, pulse 72, temperature 98 F (36.7 C), temperature source Oral, resp. rate 16, height 5\' 11"  (1.803 m), weight 79.3 kg, SpO2 100%. Body mass index is 24.38 kg/m.   Other History   These have been pulled in through the EMR, reviewed, and updated if appropriate.   Family History:   The patient's family history includes Arthritis in her maternal grandfather and maternal grandmother; Asthma in an other family member; COPD in an other family member; Cancer in an other family member; Depression in her paternal grandmother; Healthy in her father and mother; Hyperlipidemia in an other family member; Hypertension in an other family member; Stroke in an other family member.  Medical History: Past Medical History:  Diagnosis Date   Allergic rhinitis    Anxiety    Arthritis    Asthma    Bipolar disorder (HCC)    Borderline personality disorder (HCC)    Bupropion overdose    Chronic pain syndrome    Depression    Family history of adverse reaction to anesthesia    Mom - N/V   Fibromyalgia    generalized   Hallucinations     02/08/22- have cleared mom said.   Migraines    Nasal polyps    OCD (obsessive compulsive disorder)    Ovarian cyst    left   Pneumonia    2021   PONV (postoperative nausea and vomiting)    Schizophrenia (HCC)    schizoaffective   Seizures (HCC)    patient attenped sudide by tak alot of her medication,.    Surgical History: Past Surgical History:  Procedure Laterality Date   ABDOMINAL HYSTERECTOMY     LAPAROSCOPIC TUBAL LIGATION Bilateral 06/13/2019   Procedure: LAPAROSCOPIC TUBAL LIGATION;  Surgeon: Hermina Staggers, MD;  Location: [facility];  Service: Gynecology;  Laterality: Bilateral;  FILSHIE CLIPS   NASAL SINUS SURGERY     ORIF ANKLE FRACTURE Right 02/09/2022   Procedure: OPEN TREATMENT OF RIGHT BIMALLEOLAR ANKLE FRACTURE;  Surgeon: Terance Hart, MD;  Location: [facility] OR;  Service: Orthopedics;  Laterality: Right;   TUBAL LIGATION     tubes in ears     VAGINAL HYSTERECTOMY Bilateral 01/15/2020   Procedure: HYSTERECTOMY VAGINAL;  Surgeon: Hermina Staggers, MD;  Location: [facility] OR;  Service: Gynecology;  Laterality: Bilateral;   WISDOM TOOTH EXTRACTION      Medications:   Current Facility-Administered Medications:    acetaminophen (TYLENOL) tablet 650 mg, 650 mg, Oral, Q6H PRN,  650 mg at 10/20/22 0047 **OR** acetaminophen (TYLENOL) suppository 650 mg, 650 mg, Rectal, Q6H PRN, Dorrell, Robert, MD   alum & mag hydroxide-simeth (MAALOX/MYLANTA) 200-200-20 MG/5ML suspension 30 mL, 30 mL, Oral, Q4H PRN, Azucena Fallen, MD, 30 mL at 10/20/22 0045   DULoxetine (CYMBALTA) DR capsule 120 mg, 120 mg, Oral, Daily, Margaretmary Dys, MD, 120 mg at 10/19/22 0905   enoxaparin (LOVENOX) injection 40 mg, 40 mg, Subcutaneous, Q24H, Dorrell, Robert, MD, 40 mg at 10/19/22 3086   folic acid (FOLVITE) tablet 1 mg, 1 mg, Oral, Daily, Dorrell, Robert, MD, 1 mg at 10/19/22 5784   gabapentin (NEURONTIN) capsule 200 mg, 200 mg, Oral, TID, Margaretmary Dys, MD   ibuprofen (ADVIL) tablet 600 mg, 600 mg, Oral, Q4H PRN, Azucena Fallen, MD, 600 mg at 10/19/22 1838   lactated ringers infusion, , Intravenous, Continuous, Dorrell, Robert, MD, Last Rate: 200 mL/hr at 10/19/22 1607, New Bag at 10/19/22 1607   loperamide (IMODIUM) capsule 4 mg, 4 mg, Oral, PRN, Azucena Fallen, MD, 4 mg at 10/20/22 0246   lumateperone tosylate (CAPLYTA) capsule 42 mg, 42 mg, Oral, Daily, Cinderella, Margaret A, 42 mg at 10/19/22 6962   multivitamin with minerals tablet 1 tablet, 1 tablet, Oral, Daily, Dorrell, Robert, MD, 1 tablet at 10/19/22 9528   nicotine (NICODERM CQ - dosed in mg/24 hours) patch 21 mg, 21 mg, Transdermal, See admin instructions, Cinderella, Margaret A, 21 mg at 10/19/22 0556   ondansetron (ZOFRAN) tablet 4 mg, 4 mg, Oral, Q6H PRN, 4 mg at 10/18/22 0936 **OR** ondansetron (ZOFRAN) injection 4 mg, 4 mg, Intravenous, Q6H PRN, Alan Mulder, MD, 4 mg at 10/18/22 2106   [COMPLETED] PHENObarbital (LUMINAL) injection 97.5 mg, 97.5 mg, Intravenous, Q8H, 97.5 mg at 10/16/22 2249 **FOLLOWED BY** [COMPLETED] PHENObarbital (LUMINAL) injection 65 mg, 65 mg, Intravenous, Q8H, 65 mg at 10/18/22 2113 **FOLLOWED BY** PHENObarbital (LUMINAL) injection 32.5 mg, 32.5 mg, Intravenous, Q8H, Dorrell, Robert, MD, 32.5 mg at 10/20/22 0522   phenol (CHLORASEPTIC) mouth spray 1 spray, 1 spray, Mouth/Throat, PRN, Azucena Fallen, MD   sodium chloride flush (NS) 0.9 % injection 10-40 mL, 10-40 mL, Intracatheter, Q12H, Azucena Fallen, MD, 10 mL at 10/18/22 4132   sodium chloride flush (NS) 0.9 % injection 10-40 mL, 10-40 mL, Intracatheter, PRN, Azucena Fallen, MD   thiamine (VITAMIN B1) tablet 100 mg, 100 mg, Oral, Daily, 100 mg at 10/19/22 0905 **OR** thiamine (VITAMIN B1) injection 100 mg, 100 mg, Intravenous, Daily, Dorrell, Robert, MD   traZODone (DESYREL) tablet 25 mg, 25 mg, Oral, QHS PRN, Margaretmary Dys,  MD  Allergies: Allergies  Allergen Reactions   Keflex [Cephalexin] Anaphylaxis    Has tolerated amoxicillin 2019/2020

## 2022-10-20 NOTE — Progress Notes (Signed)
Educated pt. On importance of elevating lower extremities to reduce edema. RLE elevated at this time.

## 2022-10-21 ENCOUNTER — Inpatient Hospital Stay (HOSPITAL_COMMUNITY): Payer: MEDICAID

## 2022-10-21 DIAGNOSIS — T796XXD Traumatic ischemia of muscle, subsequent encounter: Secondary | ICD-10-CM | POA: Diagnosis not present

## 2022-10-21 LAB — CBC WITH DIFFERENTIAL/PLATELET
Abs Immature Granulocytes: 0.16 10*3/uL — ABNORMAL HIGH (ref 0.00–0.07)
Basophils Absolute: 0 10*3/uL (ref 0.0–0.1)
Basophils Relative: 0 %
Eosinophils Absolute: 0.1 10*3/uL (ref 0.0–0.5)
Eosinophils Relative: 1 %
HCT: 35.1 % — ABNORMAL LOW (ref 36.0–46.0)
Hemoglobin: 12 g/dL (ref 12.0–15.0)
Immature Granulocytes: 1 %
Lymphocytes Relative: 16 %
Lymphs Abs: 2.1 10*3/uL (ref 0.7–4.0)
MCH: 28.6 pg (ref 26.0–34.0)
MCHC: 34.2 g/dL (ref 30.0–36.0)
MCV: 83.6 fL (ref 80.0–100.0)
Monocytes Absolute: 1.2 10*3/uL — ABNORMAL HIGH (ref 0.1–1.0)
Monocytes Relative: 9 %
Neutro Abs: 9.8 10*3/uL — ABNORMAL HIGH (ref 1.7–7.7)
Neutrophils Relative %: 73 %
Platelets: 358 10*3/uL (ref 150–400)
RBC: 4.2 MIL/uL (ref 3.87–5.11)
RDW: 12.7 % (ref 11.5–15.5)
WBC: 13.4 10*3/uL — ABNORMAL HIGH (ref 4.0–10.5)
nRBC: 0 % (ref 0.0–0.2)

## 2022-10-21 LAB — BASIC METABOLIC PANEL
Anion gap: 7 (ref 5–15)
BUN: <5 mg/dL — ABNORMAL LOW (ref 6–20)
CO2: 26 mmol/L (ref 22–32)
Calcium: 8.2 mg/dL — ABNORMAL LOW (ref 8.9–10.3)
Chloride: 101 mmol/L (ref 98–111)
Creatinine, Ser: 0.66 mg/dL (ref 0.44–1.00)
GFR, Estimated: >60 mL/min (ref >60)
Glucose, Bld: 101 mg/dL — ABNORMAL HIGH (ref 70–99)
Potassium: 3.2 mmol/L — ABNORMAL LOW (ref 3.5–5.1)
Sodium: 134 mmol/L — ABNORMAL LOW (ref 135–145)

## 2022-10-21 LAB — CK: Total CK: 624 U/L — ABNORMAL HIGH (ref 38–234)

## 2022-10-21 LAB — MAGNESIUM: Magnesium: 2 mg/dL (ref 1.7–2.4)

## 2022-10-21 LAB — PHOSPHORUS: Phosphorus: 3.7 mg/dL (ref 2.5–4.6)

## 2022-10-21 MED ORDER — POTASSIUM CHLORIDE CRYS ER 20 MEQ PO TBCR
40.0000 meq | EXTENDED_RELEASE_TABLET | Freq: Once | ORAL | Status: AC
  Administered 2022-10-21: 40 meq via ORAL
  Filled 2022-10-21: qty 2

## 2022-10-21 MED ORDER — GABAPENTIN 300 MG PO CAPS
300.0000 mg | ORAL_CAPSULE | Freq: Three times a day (TID) | ORAL | Status: DC
  Administered 2022-10-21 – 2022-11-11 (×63): 300 mg via ORAL
  Filled 2022-10-21 (×63): qty 1

## 2022-10-21 MED ORDER — TRAMADOL HCL 50 MG PO TABS
50.0000 mg | ORAL_TABLET | Freq: Two times a day (BID) | ORAL | Status: DC | PRN
  Administered 2022-10-21 – 2022-10-23 (×5): 50 mg via ORAL
  Filled 2022-10-21 (×5): qty 1

## 2022-10-21 NOTE — Telephone Encounter (Signed)
Provider attempts to call patients mother without success. Her voicemail would not accept messages.

## 2022-10-21 NOTE — Evaluation (Signed)
Physical Therapy Evaluation Patient Details Name: Carmen Daniels MRN: [id number] DOB: 07/14/1985 Today's Date: 10/21/2022  History of Present Illness  Pt is 37 yo presenting to [facility] ED after being found down by family near vomit and multiple medications. Pt was found to have acute toxic metabolic encephalopathy and severe rhabdomyolysis. PMH: bipolar, substance abuse disorder, fibromyalgia, schizoaffective disorder, BPD, asthma, OCD, prior overdose and questionable suicidal ideation at outside facility.  Clinical Impression  Pt is presenting below baseline. Currently pt has drop foot on the R with swelling and pain that is limiting her mobility. Pt was able to perform bed mobility and sit to stand at Mod I. She declined progressing gait due to pain in the L foot and sit down quickly with poor eccentric control of bil LE due to generalized weakness and poor activity tolerance. Due to pt current functional status, home set up no recommended skilled physical therapy services at this time on discharge from acute care hospital setting. Skilled physical therapy services will continue to follow in acute care hospital setting in order to return to PLOF and age related activities to return home with decreased risk for falls, injury and re-hospitalization. Pt was limited due to pain during her evaluation.         If plan is discharge home, recommend the following: Help with stairs or ramp for entrance     Equipment Recommendations None recommended by PT     Functional Status Assessment Patient has had a recent decline in their functional status and demonstrates the ability to make significant improvements in function in a reasonable and predictable amount of time.     Precautions / Restrictions Precautions Precautions: Fall Restrictions Weight Bearing Restrictions: No      Mobility  Bed Mobility Overal bed mobility: Independent    Transfers Overall transfer level: Modified independent Equipment  used: Rolling walker (2 wheels)       General transfer comment: Pt was able to stand with RW at Mod I. minimal UE support on AD. Pt has pain in the R foot at this time due to nerve injury and declined further mobility. She has been using BSC in room without assist.    Ambulation/Gait   General Gait Details: Pt declined progressing gait at this time.       Balance Overall balance assessment: Mild deficits observed, not formally tested         Pertinent Vitals/Pain Pain Assessment Pain Assessment: 0-10 Pain Score: 8  Pain Location: pt states every where on her sores and then her foot. Pain Descriptors / Indicators: Aching, Burning Pain Intervention(s): Limited activity within patient's tolerance, Monitored during session    Home Living Family/patient expects to be discharged to:: Private residence Living Arrangements: Alone Available Help at Discharge: Other (Comment) (pt is unsure) Type of Home: House Home Access: Stairs to enter Entrance Stairs-Rails: Left;Can reach both;Right Entrance Stairs-Number of Steps: 6-8 stairs on the front and back   Home Layout: One level Home Equipment: Cane - single Librarian, academic (2 wheels);Tub bench;Wheelchair - manual Additional Comments: Pt states she has not had any physical therapy since 2022    Prior Function Prior Level of Function : Driving;Independent/Modified Independent             Mobility Comments: Pt states that she was walking with the cane and for short distances was mod I with furniture walking. ADLs Comments: Pt states that she cooks, cleans and performs all ADL's herself.     Extremity/Trunk Assessment  Upper Extremity Assessment Upper Extremity Assessment: Generalized weakness    Lower Extremity Assessment Lower Extremity Assessment: Generalized weakness;RLE deficits/detail RLE Deficits / Details: drop foot, pt has 1/5 movement in DF, toe flexion and inv/ev    Cervical / Trunk Assessment Cervical  / Trunk Assessment: Normal  Communication   Communication Communication: No apparent difficulties  Cognition Arousal: Alert Behavior During Therapy: WFL for tasks assessed/performed Overall Cognitive Status: Within Functional Limits for tasks assessed            General Comments General comments (skin integrity, edema, etc.): Pt has open areas all over her body, no noticable drainage. Pt BSC was emptied and noted urine was green; RN notified. Edema noted in the R ankle and foot.        Assessment/Plan    PT Assessment Patient needs continued PT services  PT Problem List Decreased strength;Decreased mobility       PT Treatment Interventions DME instruction;Therapeutic exercise;Gait training;Balance training;Stair training;Functional mobility training;Therapeutic activities;Patient/family education    PT Goals (Current goals can be found in the Care Plan section)  Acute Rehab PT Goals Patient Stated Goal: to go to rehab PT Goal Formulation: With patient Time For Goal Achievement: 11/04/22 Potential to Achieve Goals: Good    Frequency Min 1X/week        AM-PAC PT "6 Clicks" Mobility  Outcome Measure Help needed turning from your back to your side while in a flat bed without using bedrails?: None Help needed moving from lying on your back to sitting on the side of a flat bed without using bedrails?: None Help needed moving to and from a bed to a chair (including a wheelchair)?: None Help needed standing up from a chair using your arms (e.g., wheelchair or bedside chair)?: None Help needed to walk in hospital room?: A Lot Help needed climbing 3-5 steps with a railing? : Total 6 Click Score: 19    End of Session   Activity Tolerance: Patient limited by pain Patient left: in bed;with call bell/phone within reach Nurse Communication: Mobility status PT Visit Diagnosis: Unsteadiness on feet (R26.81);Other abnormalities of gait and mobility (R26.89)    Time:  1324-4010 PT Time Calculation (min) (ACUTE ONLY): 16 min   Charges:   PT Evaluation $PT Eval Low Complexity: 1 Low   PT General Charges $$ ACUTE PT VISIT: 1 Visit         Harrel Carina, DPT, CLT  Acute Rehabilitation Services Office: [phone] (Secure chat preferred)   Claudia Desanctis 10/21/2022, 11:40 AM

## 2022-10-21 NOTE — Progress Notes (Signed)
PROGRESS NOTE    Carmen Daniels  UJW:[id number] DOB: 02/28/1985 DOA: 10/14/2022 PCP: Salli Real, MD     Brief Narrative:  Carmen Daniels is a 37 y.o. female with medical history significant of bipolar, substance abuse disorder, prior overdose and questionable suicidal ideation at outside facility.  She presents to the ED after being found down by family.  Nearby there was noted to vomit and multiple medications.  Estimated downtime approximately 20 hours.  Given atypical labs, concern for overdose and subsequent withdrawal hospitalist was called for admission.  Psychiatry was called in consult.  Upon admission she was noted to have acute toxic metabolic encephalopathy and severe rhabdomyolysis.  Patient was started on aggressive IV fluids which slowly improved her CK levels.  During hospitalization also required electrolyte repletion.  Patient was seen by psychiatry who is recommending inpatient psychiatry once medically cleared.   Assessment & Plan:   Principal Problem:   Rhabdomyolysis Active Problems:   Bipolar disorder (HCC)   AMS (altered mental status)   Non-traumatic rhabdomyolysis   Encephalopathy, toxic   Acute toxic encephalopathy, POA At risk for polysubstance abuse Concern for overdose, incidental versus intentional Secondary to multidrug overdose.  Continue to follow detox protocol.  Patient seen by psychiatry, will require inpatient psych once medically cleared.  Patient started on Caplyta   Rhabdomyolysis, nontraumatic, ongoing Known traumatic, CK levels are trending down.  Continue IV fluids  Right lower extremity erythema/mild fluid-filled lesion RLE palsy - Cont Mupirocin ointment. Added Doxy for 10 days PO MRI lumbar spine  PT/OT   High risk for withdrawals  -Given patient's UDS panel positive for benzos as well as opiates will follow for possible withdrawals. -Avoid any sedating medications in the interim. -Phenobarbital taper initiated at intake   Diarrhea,  resolved Hypokalemia/hypophosphatemia/hypomagnesemia Aggressive repletion.     Lactic acidosis Improved with fluids   DVT prophylaxis: Lovenox Code Status:   Code Status: Full Code Family Communication: None present   Status is: Inpatient Psych would like to see ptn improve off fluids for 24 hrs.  Hopefully inptn psych tomorrow.    Consultants:  Psychiatry   Procedures:  None              Diet Orders (From admission, onward)     Start     Ordered   10/15/22 1554  Diet regular Room service appropriate? Yes; Fluid consistency: Thin  Diet effective now       Comments: Pt vegetarian  Question Answer Comment  Room service appropriate? Yes   Fluid consistency: Thin      10/15/22 1553            Subjective: Still having RLE palsy   Examination:  General exam: Appears calm and comfortable  Respiratory system: Clear to auscultation. Respiratory effort normal. Cardiovascular system: S1 & S2 heard, RRR. No JVD, murmurs, rubs, gallops or clicks. No pedal edema. Gastrointestinal system: Abdomen is nondistended, soft and nontender. No organomegaly or masses felt. Normal bowel sounds heard. Central nervous system: Alert and oriented. No focal neurological deficits. Extremities: Symmetric 5 x 5 power. Skin: RLE erythema, left knee and elbow erythema Psychiatry: Judgement and insight appear normal. Mood & affect appropriate.  Objective: Vitals:   10/20/22 1445 10/20/22 2027 10/21/22 0416 10/21/22 0729  BP: (!) 154/81 (!) 147/87 133/77 136/82  Pulse: 76 (!) 58 74 65  Resp:  17 17 17   Temp: 98.4 F (36.9 C) 99.1 F (37.3 C) 98.3 F (36.8 C) 98.5 F (36.9 C)  TempSrc: Oral Oral    SpO2: 100% 100% 100% 99%  Weight:      Height:        Intake/Output Summary (Last 24 hours) at 10/21/2022 1256 Last data filed at 10/21/2022 0448 Gross per 24 hour  Intake 2719.39 ml  Output --  Net 2719.39 ml   Filed Weights   10/15/22 0223  Weight: 79.3 kg    Scheduled  Meds:  DULoxetine  120 mg Oral Daily   enoxaparin (LOVENOX) injection  40 mg Subcutaneous Q24H   folic acid  1 mg Oral Daily   gabapentin  200 mg Oral TID   lumateperone tosylate  42 mg Oral Daily   multivitamin with minerals  1 tablet Oral Daily   mupirocin ointment   Nasal BID   nicotine  21 mg Transdermal See admin instructions   sodium chloride flush  10-40 mL Intracatheter Q12H   thiamine  100 mg Oral Daily   Or   thiamine  100 mg Intravenous Daily   Continuous Infusions:  Nutritional status     Body mass index is 24.38 kg/m.  Data Reviewed:   CBC: Recent Labs  Lab 10/17/22 1021 10/18/22 0221 10/19/22 0335 10/20/22 0233 10/21/22 0352  WBC 9.4 8.5 9.2 9.8 13.4*  NEUTROABS  --   --   --   --  9.8*  HGB 14.9 11.6* 12.3 11.3* 12.0  HCT 44.9 34.6* 35.5* 33.3* 35.1*  MCV 87.4 85.0 85.5 83.5 83.6  PLT 311 281 301 300 358   Basic Metabolic Panel: Recent Labs  Lab 10/17/22 1021 10/18/22 0221 10/19/22 0335 10/20/22 0232 10/20/22 0233 10/21/22 0352  NA 134* 133* 136  --  136 134*  K 3.6 3.5 3.3*  --  3.0* 3.2*  CL 97* 99 101  --  102 101  CO2 27 27 25   --  22 26  GLUCOSE 139* 89 93  --  103* 101*  BUN 6 5* <5*  --  <5* <5*  CREATININE 0.84 0.67 0.74  --  0.55 0.66  CALCIUM 8.6* 7.8* 8.2*  --  7.8* 8.2*  MG  --   --   --  1.5*  --  2.0  PHOS  --   --   --  2.4*  --  3.7   GFR: Estimated Creatinine Clearance: 106.6 mL/min (by C-G formula based on SCr of 0.66 mg/dL). Liver Function Tests: Recent Labs  Lab 10/16/22 0351 10/17/22 1021 10/18/22 0221 10/19/22 0335 10/20/22 0233  AST 104* 113* 79* 82* 68*  ALT 29 33 25 29 30   ALKPHOS 74 68 51 57 52  BILITOT 1.0 1.2 1.0 1.1 1.4*  PROT 5.9* 6.2* 5.0* 5.5* 4.9*  ALBUMIN 2.6* 2.9* 2.2* 2.5* 2.5*   No results for input(s): "LIPASE", "AMYLASE" in the last 168 hours. Recent Labs  Lab 10/14/22 2030  AMMONIA 13   Coagulation Profile: No results for input(s): "INR", "PROTIME" in the last 168 hours. Cardiac  Enzymes: Recent Labs  Lab 10/17/22 1021 10/18/22 0221 10/19/22 0335 10/20/22 0233 10/21/22 0352  CKTOTAL 4,144* 2,627* 2,437* 1,603* 624*   BNP (last 3 results) No results for input(s): "PROBNP" in the last 8760 hours. HbA1C: No results for input(s): "HGBA1C" in the last 72 hours. CBG: Recent Labs  Lab 10/14/22 2018 10/20/22 1132  GLUCAP 130* 91   Lipid Profile: No results for input(s): "CHOL", "HDL", "LDLCALC", "TRIG", "CHOLHDL", "LDLDIRECT" in the last 72 hours. Thyroid Function Tests: No results for input(s): "TSH", "T4TOTAL", "FREET4", "T3FREE", "  THYROIDAB" in the last 72 hours. Anemia Panel: No results for input(s): "VITAMINB12", "FOLATE", "FERRITIN", "TIBC", "IRON", "RETICCTPCT" in the last 72 hours. Sepsis Labs: Recent Labs  Lab 10/14/22 2041 10/14/22 2250  LATICACIDVEN 2.1* 2.2*    No results found for this or any previous visit (from the past 240 hour(s)).       Radiology Studies: No results found.         LOS: 6 days   Time spent= 35 mins    Miguel Rota, MD [facility]  If 7PM-7AM, please contact night-coverage  10/21/2022, 12:56 PM

## 2022-10-21 NOTE — Consult Note (Signed)
Addendum: I did review EKG yesterday and there was a large improvement in qtc.   Carmen Daniels Psychiatry Consult Evaluation  Service Date: October 21, 2022 LOS:  LOS: 6 days    Primary Psychiatric Diagnoses  Bipolar I Disorder 2.  Borderline Personality Disorder 3.  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 female admitted medically on 10/14/2022  8:08 PM for being found missing and unconscious surrounded by vomit and pills. She carries the psychiatric diagnoses of bipolar I disorder, borderline personality disorder, chronic pain syndrome, and has a past medical history of migraines, seasonal allergies, asthma, ovarian cysts, pneumonia, past intentional overdose, allergies, seizures . She reports current drug use. She reports that she does not drink alcohol. Psychiatry was consulted for concern for suicide attempt, medication management by Carmen Fallen, MD  on 10/15/2022.   Her initial presentation of altered mental status is most consistent with a likely unintentional drug overdose. She meets criteria for drug overdose based on patient report, toxicology results. Current outpatient psychotropic medications include atogepant, beclomethasone, clonazepam, duloxetine, gabapentin, haloperidol, lumateperone tosylate, metoclopramide, oxycodone, sumatriptan, trazodone and historically she has had a mixed response to these medications. They are prescribed bothy by her outpt psych NP Carmen Daniels) and PCP.  She was compliant with medications prior to admission as evidenced by chart review, although there is some concern for abuse of klonopin in ED notes. On initial examination, patient was too somnolent for full exam - remained somnolent on re-eval 8/24 but was at least more interactive. Pt on subsequent exams has been pleasant and cooperative with the interviewer. She answers all questions to the best of her ability.   Please see plan below for detailed  recommendations. Still recommending a voluntary hospitalization after she is medically cleared.  Diagnoses:  Active Hospital problems: Principal Problem:   Rhabdomyolysis Active Problems:   Other bipolar disorder (HCC)   AMS (altered mental status)   Non-traumatic rhabdomyolysis   Encephalopathy, toxic   Polysubstance abuse (HCC)     Plan   ## Psychiatric Medication Recommendations:  -- Continue Cymbalta 120 mg (outpatient regimen) for depression, neuropathic pain -- Continue lumateperone tosylate (caplyta) for bipolar disorder -- Continue trazodone 25 mg for sleep -- Increase gabapentin 200 mg to 300 mg TID for neuropathic pain, anxiety  ## Medical Decision Making Capacity:  Capacity was not formally addressed during this encounter; however, the patient gave clear responses to all questions and appeared to understand.  Patient appears to have capacity.  ## Further Work-up:  -- Per primary  -- most recent EKG on 8/28 had QtC of 471.  -- Pertinent labwork reviewed earlier this admission includes: UDS (+amphetamines, benzos, opiates, cocaine, THC), elevated lactic acid, urinalysis (protein, bilirubin), elevated CK (Trend [phone]), elevated glucose Drug use: yes.   10/14/22 23:32  Amphetamines POSITIVE !  Barbiturates NONE DETECTED  Benzodiazepines POSITIVE !  Opiates POSITIVE !  COCAINE POSITIVE !  Tetrahydrocannabinol POSITIVE !   ## Disposition:  -- We recommend inpatient psychiatric hospitalization after medical hospitalization. Patient is under voluntary admission status at this time; please IVC if attempts to leave hospital.  Patient is aware of our intention and has voiced willingness to go voluntary to behavioral health hospitalization.  ## Behavioral / Environmental:  --   Patients with borderline personality traits/disorder often use the language of physical pain to communicate both physical and emotional suffering. It is important to address pain  complaints as they arise and attempt to  identify an etiology, either organic or psychiatric. In patients with chronic pain, it is important to have a discussion with the patient about expectations about pain control.    ## Safety and Observation Level:  - Based on my clinical evaluation, I estimate the patient to be at moderate risk of self harm in the current setting - At this time, we recommend a standard level of observation. This decision is based on my review of the chart including patient's history and current presentation, interview of the patient, mental status examination, and consideration of suicide risk including evaluating suicidal ideation, plan, intent, suicidal or self-harm behaviors, risk factors, and protective factors. This judgment is based on our ability to directly address suicide risk, implement suicide prevention strategies and develop a safety plan while the patient is in the clinical setting. Please contact our team if there is a concern that risk level has changed.  Suicide risk assessment  Patient has following modifiable risk factors for suicide: recklessness, which we are addressing by recommending psychiatric hospitalization after medical clearance.   Patient has following non-modifiable or demographic risk factors for suicide: history of suicide attempt, history of self harm behavior, and psychiatric hospitalization  Patient has the following protective factors against suicide: Supportive family, Supportive friends, and Cultural, spiritual, or religious beliefs that discourage suicide  Thank you for this consult request. Recommendations have been communicated to the primary team.  We will follow at this time.   Carmen Dys, MD  Psychiatric and Social History   Relevant Aspects of Hospital Course:  Admitted on 10/14/2022 for found down at home by boyfriend. The Patient's family found her on the floor around vomit and pills. She reported been down  for around 20 hours. She had bruising and wounds on her head and side. She was arousable however was noted to have episodes of apnea. Patient's family was concerned that she may be having withdrawal to fentanyl or benzos. Labs were obtained on presentation which were revealed WBC 24.4, hemoglobin 16.8, platelets 410, creatinine 1, CK 14,390, lactic acid 2.1, UDS positive for opioids, cocaine, benzos, amphetamines, THC. Chest x-ray showed no acute disease. X-ray of hip and ankle showed no evidence of fracture CT of spine showed no acute abnormalities patient was admitted for further workup. On admission she was difficult to arouse.    Patient Report:   8/29: Pt seen at bedside twice. Second exam was with Dr Viviano Simas and pt's primary medical physician Stephania Fragmin. Pt was doing slightly better today from a medical standpoint and her improvement also carried over to her mood. Pt answered all questions without any delays or concerning pauses. Denies SI/HI/AVH.  Permission to call bf but not mother or sister. Please do not call her mother or sister.  Psychiatric ROS 08/29 Mood Symptoms Lower depression today. Frustration building with hospital.  Manic Symptoms Patient politely answered questions about symptoms of mania but denied any recently.  Anxiety Symptoms Lower anxiety today. Pt appeared visibly calmer than yesterday.  Trauma Symptoms Pt stated she has few symptoms currently.  Psychosis Symptoms Denies.   Collateral information:  Attempted to call eric seel (boyfriend) at # in chart; VM not set up so no message left.   Psychiatric History:  Information collected from chart review  Prev Dx/Sx: bipolar 1 disorder, borderline personality disorder, polysubstance use disorder, social anxiety disorder, anxiety, depression, chronic pain, seizures Current Psych Provider: Shanna Cisco, NP (Nurse Practitioner)  Current Meds: Cymbalta 120 mg daily, Caplyta 42 mg  daily, Haldol 2 mg nightly,  and trazodone 100 mg nightly as needed.  Patient is also prescribed Ambien 10 mg nightly,  gabapentin 400 mg 4 times daily, and Klonopin 1 mg 4 times daily  Previous Med Trials: Long medical history, most psychotropics have been attempted Therapy: Yes  Prior ECT: None Prior Psych Hospitalization: [facility]  05/07/2022, [facility]  03/14/2021, [facility] 2019, [facility] 2009 Prior Self Harm: Past suicidal attempts: 2023, 2021, past self harm, Prior Violence: yes  Family Psych History:Depression in her paternal grandmother;  Family Hx suicide: None found on chart review  Social History:  Developmental Hx: Could not assess due to patient sleepiness Educational Hx: Could not assess due to patient sleepiness Occupational Hx: Could not assess due to patient sleepiness Legal Hx: Could not assess due to patient sleepiness Living Situation: Lives with significant other Spiritual Hx: Could not assess due to patient sleepiness Access to weapons: Must assess prior to d/c  Tobacco use: yes, 1 ppd Alcohol use: No  8/23: BF in the room reports that she has been using fentanyl recreationally as well as takes a large amount of Klonopin which is prescribed to her, however she has been out of fentanyl for 2 days and not been taking her benzos, concern for acute opioid as well as benzodiazepine withdrawal.   Exam Findings   Psychiatric Specialty Exam:  Presentation  General Appearance: Appropriate for Environment  Eye Contact:Good  Speech:Clear and Coherent; Normal Rate  Speech Volume:Normal  Handedness:Right   Mood and Affect  Mood:Depressed  Affect:Appropriate; Congruent   Thought Process  Thought Processes:Coherent; Goal Directed; Linear  Descriptions of Associations:Intact  Orientation:Full (Time, Place and Person)  Thought Content:Logical  History of Schizophrenia/Schizoaffective disorder:No data recorded Duration of Psychotic Symptoms:No data  recorded Hallucinations:Hallucinations: None   Ideas of Reference:None  Suicidal Thoughts:Suicidal Thoughts: No   Homicidal Thoughts:Homicidal Thoughts: No    Sensorium  Memory:Immediate Good; Recent Good; Remote Good  Judgment:Fair  Insight:Fair   Executive Functions  Concentration:Good  Attention Span:Good  Recall:Good  Fund of Knowledge:Good  Language:Good   Psychomotor Activity  Psychomotor Activity:Psychomotor Activity: Normal    Assets  Assets:Intimacy; Housing   Sleep  Sleep:Sleep: Poor     Physical Exam: Vital signs:  Temp:  [98.3 F (36.8 C)-99.1 F (37.3 C)] 98.5 F (36.9 C) (08/29 0729) Pulse Rate:  [58-76] 65 (08/29 0729) Resp:  [17] 17 (08/29 0729) BP: (133-154)/(77-87) 136/82 (08/29 0729) SpO2:  [99 %-100 %] 99 % (08/29 0729)  Physical Exam Vitals and nursing note reviewed.  Constitutional:      Appearance: She is ill-appearing.  HENT:     Head: Normocephalic.  Pulmonary:     Effort: Pulmonary effort is normal.  Musculoskeletal:        General: Swelling and signs of injury present.     Right lower leg: Edema present.     Left lower leg: Edema present.  Skin:    General: Skin is dry.     Findings: Bruising present.  Neurological:     Mental Status: She is alert and oriented to person, place, and time.  Psychiatric:        Behavior: Behavior normal.     Blood pressure 136/82, pulse 65, temperature 98.5 F (36.9 C), resp. rate 17, height 5\' 11"  (1.803 m), weight 79.3 kg, SpO2 99%. Body mass index is 24.38 kg/m.   Other History   These have been pulled in through the EMR, reviewed, and updated if appropriate.  Family History:   The patient's family history includes Arthritis in her maternal grandfather and maternal grandmother; Asthma in an other family member; COPD in an other family member; Cancer in an other family member; Depression in her paternal grandmother; Healthy in her father and mother; Hyperlipidemia in  an other family member; Hypertension in an other family member; Stroke in an other family member.  Medical History: Past Medical History:  Diagnosis Date   Allergic rhinitis    Anxiety    Arthritis    Asthma    Bipolar disorder (HCC)    Borderline personality disorder (HCC)    Bupropion overdose    Chronic pain syndrome    Depression    Family history of adverse reaction to anesthesia    Mom - N/V   Fibromyalgia    generalized   Hallucinations    02/08/22- have cleared mom said.   Migraines    Nasal polyps    OCD (obsessive compulsive disorder)    Ovarian cyst    left   Pneumonia    2021   PONV (postoperative nausea and vomiting)    Schizophrenia (HCC)    schizoaffective   Seizures (HCC)    patient attenped sudide by tak alot of her medication,.    Surgical History: Past Surgical History:  Procedure Laterality Date   ABDOMINAL HYSTERECTOMY     LAPAROSCOPIC TUBAL LIGATION Bilateral 06/13/2019   Procedure: LAPAROSCOPIC TUBAL LIGATION;  Surgeon: Hermina Staggers, MD;  Location: [facility];  Service: Gynecology;  Laterality: Bilateral;  FILSHIE CLIPS   NASAL SINUS SURGERY     ORIF ANKLE FRACTURE Right 02/09/2022   Procedure: OPEN TREATMENT OF RIGHT BIMALLEOLAR ANKLE FRACTURE;  Surgeon: Terance Hart, MD;  Location: [facility] OR;  Service: Orthopedics;  Laterality: Right;   TUBAL LIGATION     tubes in ears     VAGINAL HYSTERECTOMY Bilateral 01/15/2020   Procedure: HYSTERECTOMY VAGINAL;  Surgeon: Hermina Staggers, MD;  Location: [facility] OR;  Service: Gynecology;  Laterality: Bilateral;   WISDOM TOOTH EXTRACTION      Medications:   Current Facility-Administered Medications:    acetaminophen (TYLENOL) tablet 650 mg, 650 mg, Oral, Q6H PRN, 650 mg at 10/20/22 1730 **OR** acetaminophen (TYLENOL) suppository 650 mg, 650 mg, Rectal, Q6H PRN, Dorrell, Robert, MD   alum & mag hydroxide-simeth (MAALOX/MYLANTA) 200-200-20 MG/5ML suspension 30 mL, 30 mL, Oral, Q4H PRN,  Carmen Fallen, MD, 30 mL at 10/20/22 0045   DULoxetine (CYMBALTA) DR capsule 120 mg, 120 mg, Oral, Daily, Carmen Dys, MD, 120 mg at 10/20/22 0956   enoxaparin (LOVENOX) injection 40 mg, 40 mg, Subcutaneous, Q24H, Dorrell, Robert, MD, 40 mg at 10/20/22 1020   folic acid (FOLVITE) tablet 1 mg, 1 mg, Oral, Daily, Dorrell, Robert, MD, 1 mg at 10/20/22 0956   gabapentin (NEURONTIN) capsule 200 mg, 200 mg, Oral, TID, Carmen Dys, MD, 200 mg at 10/20/22 2212   guaiFENesin (ROBITUSSIN) 100 MG/5ML liquid 5 mL, 5 mL, Oral, Q4H PRN, Amin, Ankit C, MD   hydrALAZINE (APRESOLINE) injection 10 mg, 10 mg, Intravenous, Q4H PRN, Amin, Ankit C, MD   ibuprofen (ADVIL) tablet 600 mg, 600 mg, Oral, Q4H PRN, Carmen Fallen, MD, 600 mg at 10/20/22 1730   ipratropium-albuterol (DUONEB) 0.5-2.5 (3) MG/3ML nebulizer solution 3 mL, 3 mL, Nebulization, Q4H PRN, Amin, Ankit C, MD   lactated ringers infusion, , Intravenous, Continuous, Amin, Ankit C, MD, Last Rate: 150 mL/hr at 10/21/22 0448, Infusion Verify  at 10/21/22 0448   loperamide (IMODIUM) capsule 4 mg, 4 mg, Oral, PRN, Carmen Fallen, MD, 4 mg at 10/20/22 2212   lumateperone tosylate (CAPLYTA) capsule 42 mg, 42 mg, Oral, Daily, Cinderella, Margaret A, 42 mg at 10/20/22 1027   metoprolol tartrate (LOPRESSOR) injection 5 mg, 5 mg, Intravenous, Q4H PRN, Amin, Ankit C, MD   multivitamin with minerals tablet 1 tablet, 1 tablet, Oral, Daily, Dorrell, Robert, MD, 1 tablet at 10/20/22 0956   mupirocin ointment (BACTROBAN) 2 %, , Nasal, BID, Amin, Ankit C, MD, Given at 10/20/22 2212   nicotine (NICODERM CQ - dosed in mg/24 hours) patch 21 mg, 21 mg, Transdermal, See admin instructions, Cinderella, Margaret A, 21 mg at 10/19/22 0556   ondansetron (ZOFRAN) tablet 4 mg, 4 mg, Oral, Q6H PRN, 4 mg at 10/18/22 0936 **OR** ondansetron (ZOFRAN) injection 4 mg, 4 mg, Intravenous, Q6H PRN, Alan Mulder, MD, 4 mg at 10/18/22  2106   phenol (CHLORASEPTIC) mouth spray 1 spray, 1 spray, Mouth/Throat, PRN, Carmen Fallen, MD   phosphorus (K PHOS NEUTRAL) tablet 500 mg, 500 mg, Oral, BID, Amin, Ankit C, MD, 500 mg at 10/20/22 2212   senna-docusate (Senokot-S) tablet 1 tablet, 1 tablet, Oral, QHS PRN, Amin, Ankit C, MD   sodium chloride flush (NS) 0.9 % injection 10-40 mL, 10-40 mL, Intracatheter, Q12H, Carmen Fallen, MD, 10 mL at 10/20/22 2215   sodium chloride flush (NS) 0.9 % injection 10-40 mL, 10-40 mL, Intracatheter, PRN, Carmen Fallen, MD   thiamine (VITAMIN B1) tablet 100 mg, 100 mg, Oral, Daily, 100 mg at 10/20/22 0956 **OR** thiamine (VITAMIN B1) injection 100 mg, 100 mg, Intravenous, Daily, Dorrell, Robert, MD   traZODone (DESYREL) tablet 25 mg, 25 mg, Oral, QHS PRN, Carmen Dys, MD  Allergies: Allergies  Allergen Reactions   Keflex [Cephalexin] Anaphylaxis    Has tolerated amoxicillin 2019/2020

## 2022-10-21 NOTE — Evaluation (Signed)
Occupational Therapy Evaluation Patient Details Name: Carmen Daniels MRN: [id number] DOB: 1986/02/04 Today's Date: 10/21/2022   History of Present Illness Pt is 37 yo presenting to [facility] ED after being found down by family near vomit and multiple medications. Pt was found to have acute toxic metabolic encephalopathy and severe rhabdomyolysis. PMH: bipolar, substance abuse disorder, fibromyalgia, schizoaffective disorder, BPD, asthma, OCD, prior overdose and questionable suicidal ideation at outside facility.   Clinical Impression     Patient lives at home at home alone, in a single-level home with 5-6 steps to enter and reports being Independent in ADLs and IADLs.  Patient currently requires mod I for bed mobility and  Supervision  for functional mobility  with RW 2/2 R drop foot that patient reporting has happened since this recent fall. with RW. Patient would benefit from additional OT intervention to address functional deficits listed above in order for patient tot return to PLOF and possibly HHOT to make sure patient is safety transitioned back to home environment       If plan is discharge home, recommend the following: A little help with walking and/or transfers;Assistance with cooking/housework;Direct supervision/assist for medications management;Direct supervision/assist for financial management;Help with stairs or ramp for entrance;Assist for transportation    Functional Status Assessment  Patient has had a recent decline in their functional status and demonstrates the ability to make significant improvements in function in a reasonable and predictable amount of time.  Equipment Recommendations  None recommended by OT    Recommendations for Other Services       Precautions / Restrictions Precautions Precautions: Fall Restrictions Weight Bearing Restrictions: No      Mobility Bed Mobility Overal bed mobility: Independent                  Transfers Overall transfer  level: Modified independent Equipment used: Rolling walker (2 wheels)               General transfer comment: Pt was able to stand with RW at Mod I. minimal UE support on AD. Pt has pain in the R foot at this time due to nerve injury and declined further mobility. She has been using BSC in room without assist.      Balance                                           ADL either performed or assessed with clinical judgement   ADL Overall ADL's : Needs assistance/impaired Eating/Feeding: Independent;Sitting   Grooming: Wash/dry face;Wash/dry hands;Standing;Supervision/safety   Upper Body Bathing: Independent;Sitting   Lower Body Bathing: Set up;Sitting/lateral leans   Upper Body Dressing : Set up;Sitting   Lower Body Dressing: Sitting/lateral leans;Set up   Toilet Transfer: BSC/3in1;Set up;Supervision/safety   Toileting- Clothing Manipulation and Hygiene: Supervision/safety       Functional mobility during ADLs: Supervision/safety       Vision Baseline Vision/History: 0 No visual deficits Patient Visual Report: No change from baseline       Perception         Praxis         Pertinent Vitals/Pain Pain Assessment Pain Assessment: 0-10 Pain Score: 6  Pain Location: reports pain is all over Pain Descriptors / Indicators: Burning Pain Intervention(s): Monitored during session     Extremity/Trunk Assessment Upper Extremity Assessment Upper Extremity Assessment: Overall WFL for tasks assessed   Lower  Extremity Assessment RLE Deficits / Details:  (patient reporting R foot drop since most recent fall)   Cervical / Trunk Assessment Cervical / Trunk Assessment: Normal   Communication Communication Communication: No apparent difficulties   Cognition Arousal: Alert Behavior During Therapy: WFL for tasks assessed/performed Overall Cognitive Status: Within Functional Limits for tasks assessed                                        General Comments       Exercises     Shoulder Instructions      Home Living Family/patient expects to be discharged to:: Private residence Living Arrangements: Alone Available Help at Discharge: Other (Comment) Type of Home: House Home Access: Stairs to enter [employer] of Steps: 6-8 stairs on the front and back Entrance Stairs-Rails: Left;Can reach both;Right Home Layout: One level     Bathroom Shower/Tub: Tub/shower unit;Walk-in shower   Bathroom Toilet: Handicapped height Bathroom Accessibility: Yes   Home Equipment: Cane - single Librarian, academic (2 wheels);Tub bench;Wheelchair - manual   Additional Comments:  (reports uses cane)      Prior Functioning/Environment Prior Level of Function :  (does not drive, Independent in ADLd and IADLs, groceries are delivered)             Mobility Comments: Pt states that she was walking with the cane and for short distances was mod I with furniture walking. ADLs Comments: Pt states that she cooks, cleans and performs all ADL's herself.        OT Problem List: Decreased activity tolerance;Decreased strength      OT Treatment/Interventions: Self-care/ADL training;Therapeutic exercise;Therapeutic activities;Patient/family education    OT Goals(Current goals can be found in the care plan section) Acute Rehab OT Goals OT Goal Formulation: With patient Time For Goal Achievement: 11/04/22 Potential to Achieve Goals: Good  OT Frequency: Min 1X/week    Co-evaluation              AM-PAC OT "6 Clicks" Daily Activity     Outcome Measure Help from another person eating meals?: None Help from another person taking care of personal grooming?: None Help from another person toileting, which includes using toliet, bedpan, or urinal?: A Little Help from another person bathing (including washing, rinsing, drying)?: A Little Help from another person to put on and taking off regular upper body clothing?:  None Help from another person to put on and taking off regular lower body clothing?: A Little 6 Click Score: 21   End of Session Equipment Utilized During Treatment: Rolling walker (2 wheels) Nurse Communication: Mobility status  Activity Tolerance: Patient tolerated treatment well Patient left: with call bell/phone within reach;in bed  OT Visit Diagnosis: Unsteadiness on feet (R26.81);Muscle weakness (generalized) (M62.81)                Time: 1057-1110 OT Time Calculation (min): 13 min Charges:  OT General Charges $OT Visit: 1 Visit OT Evaluation $OT Eval Low Complexity: 1 Low  Governor Specking OT/L  Denice Paradise 10/21/2022, 4:52 PM

## 2022-10-22 DIAGNOSIS — F332 Major depressive disorder, recurrent severe without psychotic features: Secondary | ICD-10-CM

## 2022-10-22 DIAGNOSIS — M6282 Rhabdomyolysis: Secondary | ICD-10-CM | POA: Diagnosis not present

## 2022-10-22 DIAGNOSIS — T796XXD Traumatic ischemia of muscle, subsequent encounter: Secondary | ICD-10-CM | POA: Diagnosis not present

## 2022-10-22 DIAGNOSIS — F1910 Other psychoactive substance abuse, uncomplicated: Secondary | ICD-10-CM | POA: Diagnosis not present

## 2022-10-22 DIAGNOSIS — F314 Bipolar disorder, current episode depressed, severe, without psychotic features: Secondary | ICD-10-CM | POA: Diagnosis present

## 2022-10-22 LAB — CBC
HCT: 39.2 % (ref 36.0–46.0)
Hemoglobin: 13.5 g/dL (ref 12.0–15.0)
MCH: 28.5 pg (ref 26.0–34.0)
MCHC: 34.4 g/dL (ref 30.0–36.0)
MCV: 82.7 fL (ref 80.0–100.0)
Platelets: 350 10*3/uL (ref 150–400)
RBC: 4.74 MIL/uL (ref 3.87–5.11)
RDW: 13 % (ref 11.5–15.5)
WBC: 13.2 10*3/uL — ABNORMAL HIGH (ref 4.0–10.5)
nRBC: 0 % (ref 0.0–0.2)

## 2022-10-22 LAB — MAGNESIUM: Magnesium: 1.9 mg/dL (ref 1.7–2.4)

## 2022-10-22 LAB — COMPREHENSIVE METABOLIC PANEL
ALT: 61 U/L — ABNORMAL HIGH (ref 0–44)
AST: 49 U/L — ABNORMAL HIGH (ref 15–41)
Albumin: 3.2 g/dL — ABNORMAL LOW (ref 3.5–5.0)
Alkaline Phosphatase: 76 U/L (ref 38–126)
Anion gap: 9 (ref 5–15)
BUN: <5 mg/dL — ABNORMAL LOW (ref 6–20)
CO2: 26 mmol/L (ref 22–32)
Calcium: 8.7 mg/dL — ABNORMAL LOW (ref 8.9–10.3)
Chloride: 100 mmol/L (ref 98–111)
Creatinine, Ser: 0.67 mg/dL (ref 0.44–1.00)
GFR, Estimated: >60 mL/min (ref >60)
Glucose, Bld: 119 mg/dL — ABNORMAL HIGH (ref 70–99)
Potassium: 3.5 mmol/L (ref 3.5–5.1)
Sodium: 135 mmol/L (ref 135–145)
Total Bilirubin: 1.3 mg/dL — ABNORMAL HIGH (ref 0.3–1.2)
Total Protein: 6.3 g/dL — ABNORMAL LOW (ref 6.5–8.1)

## 2022-10-22 LAB — CK: Total CK: 237 U/L — ABNORMAL HIGH (ref 38–234)

## 2022-10-22 MED ORDER — DOXYCYCLINE HYCLATE 100 MG PO TABS: 100.0000 mg | ORAL_TABLET | Freq: Two times a day (BID) | ORAL | Status: DC

## 2022-10-22 MED ORDER — ENSURE ENLIVE PO LIQD
237.0000 mL | Freq: Two times a day (BID) | ORAL | Status: DC
  Administered 2022-10-22 – 2022-11-05 (×12): 237 mL via ORAL

## 2022-10-22 MED ORDER — DOXYCYCLINE HYCLATE 100 MG PO TABS
100.0000 mg | ORAL_TABLET | Freq: Two times a day (BID) | ORAL | Status: AC
  Administered 2022-10-22 – 2022-10-28 (×14): 100 mg via ORAL
  Filled 2022-10-22 (×14): qty 1

## 2022-10-22 NOTE — TOC Progression Note (Signed)
Transition of Care [facility]) - Progression Note    Patient Details  Name: Carmen Daniels MRN: [id number] Date of Birth: 1985/05/27  [facility] CM/SW Contact  Lorri Frederick, LCSW Phone Number: 10/22/2022, 4:10 PM  Clinical Narrative:   Laurell Josephs.  Asked to speak with RN, after getting update that pt not completely independent with mobility, unable to accept.           Expected Discharge Plan and Services         Expected Discharge Date: 10/22/22                                     Social Determinants of Health (SDOH) Interventions SDOH Screenings   Food Insecurity: No Food Insecurity (10/15/2022)  Housing: Low Risk  (10/15/2022)  Transportation Needs: No Transportation Needs (10/15/2022)  Utilities: Not At Risk (10/15/2022)  Alcohol Screen: Low Risk  (04/10/2017)  Depression (PHQ2-9): High Risk (08/04/2022)  Financial Resource Strain: Medium Risk (05/07/2022)   Received from [facility], [facility]  Social Connections: Unknown (06/24/2021)   Received from [facility], [facility]  Stress: Stress Concern Present (05/07/2022)   Received from [facility], [facility]  Tobacco Use: High Risk (10/15/2022)    Readmission Risk Interventions     No data to display

## 2022-10-22 NOTE — Consult Note (Signed)
Addendum: I did review EKG yesterday and there was a large improvement in qtc.   [name] Consult Evaluation  Service Date: October 22, 2022 LOS:  LOS: 7 days    Primary Psychiatric Diagnoses  Bipolar I Disorder 2.  Borderline Personality Disorder 3.  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 female admitted medically on 10/14/2022  8:08 PM for being found missing and unconscious surrounded by vomit and pills. She carries the psychiatric diagnoses of bipolar I disorder, borderline personality disorder, chronic pain syndrome, and has a past medical history of migraines, seasonal allergies, asthma, ovarian cysts, pneumonia, past intentional overdose, allergies, seizures . She reports current drug use. She reports that she does not drink alcohol. Psychiatry was consulted for concern for suicide attempt, medication management by Azucena Fallen, MD  on 10/15/2022.   Her initial presentation of altered mental status is most consistent with a likely unintentional drug overdose. She meets criteria for drug overdose based on patient report, toxicology results. Current outpatient psychotropic medications include atogepant, beclomethasone, clonazepam, duloxetine, gabapentin, haloperidol, lumateperone tosylate, metoclopramide, oxycodone, sumatriptan, trazodone and historically she has had a mixed response to these medications. They are prescribed bothy by her outpt psych NP [name]) and PCP.  She was compliant with medications prior to admission as evidenced by chart review, although there is some concern for abuse of klonopin in ED notes. On initial examination, patient was too somnolent for full exam - remained somnolent on re-eval 8/24 but was at least more interactive. Pt on subsequent exams has been pleasant and cooperative with the interviewer. She answers all questions to the best of her ability.   Please see plan below for detailed  recommendations. Still recommending a voluntary hospitalization, but this has been complicated by the patient's unwillingness to use her walker, equipment that is preferred at most [facility] facilities. PT report still indicates that pt has significant mobility challenges over longer distances and requires a wheelchair for that, but could do shorter distances with a rolling walker.    Diagnoses:  Active Hospital problems: Principal Problem:   Rhabdomyolysis Active Problems:   Other bipolar disorder (HCC)   AMS (altered mental status)   Non-traumatic rhabdomyolysis   Encephalopathy, toxic   Polysubstance abuse (HCC)     Plan   ## Psychiatric Medication Recommendations:  -- Continue Cymbalta 120 mg (outpatient regimen) for depression, neuropathic pain      -- Consider switching her primary antidepressant agent at next hospitalization - transaminitis and reduced effectiveness. -- Continue lumateperone tosylate (caplyta) for bipolar disorder -- Continue trazodone 25 mg for sleep -- Continue gabapentin at 300 mg TID for neuropathic pain, anxiety  ## Medical Decision Making Capacity:  Capacity was not formally addressed during this encounter; however, the patient gave clear responses to all questions and appeared to understand. Patient appears to have capacity.  ## Further Work-up:  -- Per primary  -- most recent EKG on 8/28 had QtC of 471.  -- Pertinent labwork reviewed earlier this admission includes: UDS (+amphetamines, benzos, opiates, cocaine, THC), elevated lactic acid, urinalysis (protein, bilirubin), elevated CK (Trend [phone]), elevated glucose Drug use: yes.   10/14/22 23:32  Amphetamines POSITIVE !  Barbiturates NONE DETECTED  Benzodiazepines POSITIVE !  Opiates POSITIVE !  COCAINE POSITIVE !  Tetrahydrocannabinol POSITIVE !   ## Disposition:  -- We recommend inpatient psychiatric hospitalization after medical hospitalization. Patient is under voluntary admission  status at this time; please IVC if  attempts to leave hospital.  Patient is aware of our intention and has voiced willingness to go voluntary to behavioral health hospitalization, she would like to go to drug rehab afterwards.  ## Behavioral / Environmental:  --   Patients with borderline personality traits/disorder often use the language of physical pain to communicate both physical and emotional suffering. It is important to address pain complaints as they arise and attempt to identify an etiology, either organic or psychiatric. In patients with chronic pain, it is important to have a discussion with the patient about expectations about pain control.  ## Safety and Observation Level:  - Based on my clinical evaluation, I estimate the patient to be at moderate risk of self harm in the current setting - At this time, we recommend a standard level of observation. This decision is based on my review of the chart including patient's history and current presentation, interview of the patient, mental status examination, and consideration of suicide risk including evaluating suicidal ideation, plan, intent, suicidal or self-harm behaviors, risk factors, and protective factors. This judgment is based on our ability to directly address suicide risk, implement suicide prevention strategies and develop a safety plan while the patient is in the clinical setting. Please contact our team if there is a concern that risk level has changed.  Suicide risk assessment  Patient has following modifiable risk factors for suicide: recklessness, which we are addressing by recommending psychiatric hospitalization and drug rehab.   Patient has following non-modifiable or demographic risk factors for suicide: history of suicide attempt, history of self harm behavior, and psychiatric hospitalization  Patient has the following protective factors against suicide: Supportive family, Supportive friends, and Cultural, spiritual, or  religious beliefs that discourage suicide  Thank you for this consult request. Recommendations have been communicated to the primary team.  We will follow at this time.   Margaretmary Dys, MD  Psychiatric and Social History   Relevant Aspects of Hospital Course:  Admitted on 10/14/2022 for found down at home by boyfriend. The Patient's family found her on the floor around vomit and pills. She reported been down for around 20 hours. She had bruising and wounds on her head and side. She was arousable however was noted to have episodes of apnea. Patient's family was concerned that she may be having withdrawal to fentanyl or benzos. Labs were obtained on presentation which were revealed WBC 24.4, hemoglobin 16.8, platelets 410, creatinine 1, CK 14,390, lactic acid 2.1, UDS positive for opioids, cocaine, benzos, amphetamines, THC. Chest x-ray showed no acute disease. X-ray of hip and ankle showed no evidence of fracture CT of spine showed no acute abnormalities patient was admitted for further workup. On admission she was difficult to arouse.    Patient Report:   8/30: Pt seen at bedside. Pt looked improved relative to yesterday, she is still voicing pain in her R foot. Bilateral LE has improved dramatically relative to yesterday. Pt was complaining about being a vegetarian and not having good options for protein. Offered her milk chocolate ensure to assist her with nutrition. Pt denies SI/HI/AVH.  Permission to call bf but not mother or sister. Please do not call her mother or sister.  Psychiatric ROS 08/29 Mood Symptoms Lower depression today. Looking forward to discharge.  Manic Symptoms Patient politely answered questions about symptoms of mania but denied any recently.  Anxiety Symptoms Lower anxiety today. Pt appeared visibly calmer than yesterday.  Trauma Symptoms Pt stated she has few symptoms currently.  Psychosis Symptoms Denies.  Collateral information:  Attempted  to call eric seel (boyfriend) at # in chart; VM not set up so no message left.   Psychiatric History:  Information collected from chart review  Prev Dx/Sx: bipolar 1 disorder, borderline personality disorder, polysubstance use disorder, social anxiety disorder, anxiety, depression, chronic pain, seizures Current Psych Provider: Shanna Cisco, NP (Nurse Practitioner)  Current Meds: Cymbalta 120 mg daily, Caplyta 42 mg daily, Haldol 2 mg nightly, and trazodone 100 mg nightly as needed.  Patient is also prescribed Ambien 10 mg nightly,  gabapentin 400 mg 4 times daily, and Klonopin 1 mg 4 times daily  Previous Med Trials: Long medical history, most psychotropics have been attempted Therapy: Yes  Prior ECT: None Prior Psych Hospitalization: [facility]  05/07/2022, [facility]  03/14/2021, [facility] 2019, [facility] 2009 Prior Self Harm: Past suicidal attempts: 2023, 2021, past self harm, Prior Violence: yes  Family Psych History:Depression in her paternal grandmother;  Family Hx suicide: None found on chart review  Social History:  Access to weapons: Denies access  Tobacco use: yes, 1 ppd Alcohol use: No  8/23: BF in the room reports that she has been using fentanyl recreationally as well as takes a large amount of Klonopin which is prescribed to her, however she has been out of fentanyl for 2 days and not been taking her benzos, concern for acute opioid as well as benzodiazepine withdrawal.   Exam Findings  Psychiatric Specialty Exam:  Presentation  General Appearance: Casual; Fairly Groomed Eye Contact:Good Speech:Clear and Coherent; Normal Rate Speech Volume:Normal Handedness:Right  Mood and Affect  Mood:Anxious; Depressed Affect:Appropriate; Congruent  Thought Process  Thought Processes:Coherent; Goal Directed; Linear Descriptions of Associations:Intact Orientation:Full (Time, Place and Person) Thought Content:Logical History of  Schizophrenia/Schizoaffective disorder:No data recorded Duration of Psychotic Symptoms:No data recorded Hallucinations:Hallucinations: None Ideas of Reference:None Suicidal Thoughts:Suicidal Thoughts: No Homicidal Thoughts:Homicidal Thoughts: No  Sensorium  Memory:Immediate Good; Recent Good; Remote Good Judgment:Fair Insight:Fair  Executive Functions  Concentration:Good Attention Span:Good Recall:Good Fund of Knowledge:Good Language:Good  Psychomotor Activity  Psychomotor Activity:Psychomotor Activity: Normal  Assets  Assets:Intimacy; Communication Skills  Sleep  Sleep:Sleep: Fair  Physical Exam: Vital signs:  Temp:  [98.6 F (37 C)-99.3 F (37.4 C)] 98.6 F (37 C) (08/30 1354) Pulse Rate:  [87-106] 106 (08/30 1354) Resp:  [18] 18 (08/30 1354) BP: (116-146)/(57-91) 116/57 (08/30 1354) SpO2:  [99 %-100 %] 100 % (08/30 1354)  Physical Exam Vitals and nursing note reviewed.  Constitutional:      Appearance: She is ill-appearing.  HENT:     Head: Normocephalic.  Pulmonary:     Effort: Pulmonary effort is normal.  Musculoskeletal:        General: Signs of injury present.  Skin:    General: Skin is dry.     Findings: Bruising present.  Neurological:     Mental Status: She is alert and oriented to person, place, and time.  Psychiatric:        Behavior: Behavior normal.        Judgment: Judgment normal.     Blood pressure (!) 116/57, pulse (!) 106, temperature 98.6 F (37 C), temperature source Oral, resp. rate 18, height 5\' 11"  (1.803 m), weight 79.3 kg, SpO2 100%. Body mass index is 24.38 kg/m.   Other History   These have been pulled in through the EMR, reviewed, and updated if appropriate.   Family History:   The patient's family history includes Arthritis in her maternal grandfather  and maternal grandmother; Asthma in an other family member; COPD in an other family member; Cancer in an other family member; Depression in her paternal grandmother;  Healthy in her father and mother; Hyperlipidemia in an other family member; Hypertension in an other family member; Stroke in an other family member.  Medical History: Past Medical History:  Diagnosis Date   Allergic rhinitis    Anxiety    Arthritis    Asthma    Bipolar disorder (HCC)    Borderline personality disorder (HCC)    Bupropion overdose    Chronic pain syndrome    Depression    Family history of adverse reaction to anesthesia    Mom - N/V   Fibromyalgia    generalized   Hallucinations    02/08/22- have cleared mom said.   Migraines    Nasal polyps    OCD (obsessive compulsive disorder)    Ovarian cyst    left   Pneumonia    2021   PONV (postoperative nausea and vomiting)    Schizophrenia (HCC)    schizoaffective   Seizures (HCC)    patient attenped sudide by tak alot of her medication,.    Surgical History: Past Surgical History:  Procedure Laterality Date   ABDOMINAL HYSTERECTOMY     LAPAROSCOPIC TUBAL LIGATION Bilateral 06/13/2019   Procedure: LAPAROSCOPIC TUBAL LIGATION;  Surgeon: Hermina Staggers, MD;  Location: [facility];  Service: Gynecology;  Laterality: Bilateral;  FILSHIE CLIPS   NASAL SINUS SURGERY     ORIF ANKLE FRACTURE Right 02/09/2022   Procedure: OPEN TREATMENT OF RIGHT BIMALLEOLAR ANKLE FRACTURE;  Surgeon: Terance Hart, MD;  Location: [facility] OR;  Service: Orthopedics;  Laterality: Right;   TUBAL LIGATION     tubes in ears     VAGINAL HYSTERECTOMY Bilateral 01/15/2020   Procedure: HYSTERECTOMY VAGINAL;  Surgeon: Hermina Staggers, MD;  Location: [facility] OR;  Service: Gynecology;  Laterality: Bilateral;   WISDOM TOOTH EXTRACTION      Medications:   Current Facility-Administered Medications:    acetaminophen (TYLENOL) tablet 650 mg, 650 mg, Oral, Q6H PRN, 650 mg at 10/20/22 1730 **OR** acetaminophen (TYLENOL) suppository 650 mg, 650 mg, Rectal, Q6H PRN, Dorrell, Robert, MD   alum & mag hydroxide-simeth (MAALOX/MYLANTA)  200-200-20 MG/5ML suspension 30 mL, 30 mL, Oral, Q4H PRN, Azucena Fallen, MD, 30 mL at 10/20/22 0045   doxycycline (VIBRA-TABS) tablet 100 mg, 100 mg, Oral, Q12H, Amin, Ankit C, MD, 100 mg at 10/22/22 1610   DULoxetine (CYMBALTA) DR capsule 120 mg, 120 mg, Oral, Daily, Margaretmary Dys, MD, 120 mg at 10/22/22 0904   enoxaparin (LOVENOX) injection 40 mg, 40 mg, Subcutaneous, Q24H, Dorrell, Robert, MD, 40 mg at 10/22/22 9604   feeding supplement (ENSURE ENLIVE / ENSURE PLUS) liquid 237 mL, 237 mL, Oral, BID BM, Margaretmary Dys, MD, 237 mL at 10/22/22 1310   folic acid (FOLVITE) tablet 1 mg, 1 mg, Oral, Daily, Dorrell, Robert, MD, 1 mg at 10/22/22 5409   gabapentin (NEURONTIN) capsule 300 mg, 300 mg, Oral, TID, Margaretmary Dys, MD, 300 mg at 10/22/22 0904   guaiFENesin (ROBITUSSIN) 100 MG/5ML liquid 5 mL, 5 mL, Oral, Q4H PRN, Amin, Ankit C, MD   hydrALAZINE (APRESOLINE) injection 10 mg, 10 mg, Intravenous, Q4H PRN, Amin, Ankit C, MD   ibuprofen (ADVIL) tablet 600 mg, 600 mg, Oral, Q4H PRN, Azucena Fallen, MD, 600 mg at 10/20/22 1730   ipratropium-albuterol (DUONEB) 0.5-2.5 (3) MG/3ML nebulizer  solution 3 mL, 3 mL, Nebulization, Q4H PRN, Amin, Ankit C, MD   loperamide (IMODIUM) capsule 4 mg, 4 mg, Oral, PRN, Azucena Fallen, MD, 4 mg at 10/20/22 2212   lumateperone tosylate (CAPLYTA) capsule 42 mg, 42 mg, Oral, Daily, Cinderella, Margaret A, 42 mg at 10/22/22 6578   metoprolol tartrate (LOPRESSOR) injection 5 mg, 5 mg, Intravenous, Q4H PRN, Amin, Ankit C, MD   multivitamin with minerals tablet 1 tablet, 1 tablet, Oral, Daily, Dorrell, Robert, MD, 1 tablet at 10/22/22 0904   mupirocin ointment (BACTROBAN) 2 %, , Nasal, BID, Amin, Ankit C, MD, Given at 10/22/22 4696   nicotine (NICODERM CQ - dosed in mg/24 hours) patch 21 mg, 21 mg, Transdermal, See admin instructions, Cinderella, Margaret A, 21 mg at 10/21/22 1604   ondansetron (ZOFRAN)  tablet 4 mg, 4 mg, Oral, Q6H PRN, 4 mg at 10/18/22 0936 **OR** ondansetron (ZOFRAN) injection 4 mg, 4 mg, Intravenous, Q6H PRN, Alan Mulder, MD, 4 mg at 10/18/22 2106   phenol (CHLORASEPTIC) mouth spray 1 spray, 1 spray, Mouth/Throat, PRN, Azucena Fallen, MD   senna-docusate (Senokot-S) tablet 1 tablet, 1 tablet, Oral, QHS PRN, Amin, Ankit C, MD   sodium chloride flush (NS) 0.9 % injection 10-40 mL, 10-40 mL, Intracatheter, Q12H, Azucena Fallen, MD, 10 mL at 10/22/22 0905   sodium chloride flush (NS) 0.9 % injection 10-40 mL, 10-40 mL, Intracatheter, PRN, Azucena Fallen, MD   thiamine (VITAMIN B1) tablet 100 mg, 100 mg, Oral, Daily, 100 mg at 10/22/22 0904 **OR** thiamine (VITAMIN B1) injection 100 mg, 100 mg, Intravenous, Daily, Dorrell, Robert, MD   traMADol Janean Sark) tablet 50 mg, 50 mg, Oral, Q12H PRN, Amin, Ankit C, MD, 50 mg at 10/21/22 2143   traZODone (DESYREL) tablet 25 mg, 25 mg, Oral, QHS PRN, Margaretmary Dys, MD  Allergies: Allergies  Allergen Reactions   Keflex [Cephalexin] Anaphylaxis    Has tolerated amoxicillin 2019/2020

## 2022-10-22 NOTE — Progress Notes (Signed)
Orthopedic Tech Progress Note Patient Details:  Carmen Daniels March 18, 1985 [id number]  Patient ID: Carmen Daniels, female   DOB: Apr 06, 1985, 37 y.o.   MRN: [id number] I called order into [facility] 10/22/2022, 10:37 AM

## 2022-10-22 NOTE — Progress Notes (Signed)
Orthopedic Tech Progress Note Patient Details:  Carmen Daniels 07/17/1985 [id number]  Ortho Devices Type of Ortho Device: Prafo boot/shoe Ortho Device/Splint Location: rle Ortho Device/Splint Interventions: Ordered, Application, Adjustment   Daniels Interventions Patient Tolerated: Well Instructions Provided: Care of device, Adjustment of device  Carmen Daniels 10/22/2022, 5:15 PM

## 2022-10-22 NOTE — Plan of Care (Signed)

## 2022-10-22 NOTE — Social Work (Signed)
[facility] unable to offer a bed to this pt, pt was faxed out to additional psych facilities: [facility] will continue to follow for DC planning needs.

## 2022-10-22 NOTE — Progress Notes (Signed)
Physical Therapy Treatment Patient Details Name: Carmen Daniels MRN: [id number] DOB: [birth year] Today's Date: 10/22/2022   History of Present Illness Pt is 37 yo presenting to [facility] ED after being found down by family near vomit and multiple medications. Pt was found to have acute toxic metabolic encephalopathy and severe rhabdomyolysis. PMH: bipolar, substance abuse disorder, fibromyalgia, schizoaffective disorder, BPD, asthma, OCD, prior overdose and questionable suicidal ideation at outside facility.    PT Comments  Pt is progressing towards goals. Pt was able to progress gait this session demonstrating steppage gait on the R due to foot drop. Pt has significant instability at the R ankle with WB at an increased risk for falls and rolling the ankle which pt has had a previous surgery with hardware placement. Pt is Mod I for transfers from EOB to [facility] and Min a for short distance gait without AD at [facility] Due to pt current functional mobility, home set up and available assistance recommending skilled physical therapy services >3 hours/day 5-6 days/week at a higher level of care and frequency in order to work on strength, balance and functional mobility to decrease risk for falls, injury and re-hospitalization.     If plan is discharge home, recommend the following: Help with stairs or ramp for entrance;A little help with walking and/or transfers;Assistance with cooking/housework;Assist for transportation     Equipment Recommendations  Other (comment) (defer equipment needs to post acute. For long distances pt may benefit from W/C if R ankle movement does not improve.)       Precautions / Restrictions Precautions Precautions: Fall Precaution Comments: R drop foot Restrictions Weight Bearing Restrictions: No     Mobility  Bed Mobility Overal bed mobility: Independent      Transfers Overall transfer level: Modified independent Equipment used: Rolling walker (2 wheels)       General  transfer comment: Pt was able to stand with RW at Mod I. minimal UE support on AD. Pt has pain in the R foot at this time due to nerve injury. She has been using BSC in room without assist.    Ambulation/Gait Ambulation/Gait assistance: Contact guard assist, Min assist Gait Distance (Feet): 20 Feet Assistive device: Rolling walker (2 wheels), 1 person hand held assist Gait Pattern/deviations: Step-through pattern, Steppage, Decreased stride length Gait velocity: Significantly decreased cadence. Gait velocity interpretation: <1.31 ft/sec, indicative of household ambulator   General Gait Details: Due to R foot drop pt is demonstrating steppage gait on the R to clear floor. Pt has significant instability at her R ankle due to nerve injury and continues with very minimal activity at the R foot. WB increases pain. Attempted 3 ft of gait with HHA with steppage gait and Min A due to impaired balance secondary to impaired sensation on the RLE.        Balance Overall balance assessment: Needs assistance Sitting-balance support: No upper extremity supported, Feet supported Sitting balance-Leahy Scale: Good     Standing balance support: Single extremity supported, Bilateral upper extremity supported, During functional activity, Reliant on assistive device for balance Standing balance-Leahy Scale: Fair Standing balance comment: Pt requires UE support for functional mobility due to R foot deficits        Cognition Arousal: Alert Behavior During Therapy: WFL for tasks assessed/performed Overall Cognitive Status: Within Functional Limits for tasks assessed           General Comments General comments (skin integrity, edema, etc.): Pt has open areas all over body. Bulae on the R anterior  ankle without noticable drainage. Pt continues with edema in the R ankle and foot. Concern for AFO with long distance gait due to bulae on the R ankle that will be prone to rubbing and opening.       Pertinent Vitals/Pain Pain Assessment Pain Assessment: Faces Faces Pain Scale: Hurts little more Breathing: normal Negative Vocalization: occasional moan/groan, low speech, negative/disapproving quality Facial Expression: sad, frightened, frown Body Language: relaxed Consolability: no need to console PAINAD Score: 2 Pain Location: R foot Pain Descriptors / Indicators: Burning Pain Intervention(s): Monitored during session     PT Goals (current goals can now be found in the care plan section) Acute Rehab PT Goals Patient Stated Goal: to go to rehab PT Goal Formulation: With patient Time For Goal Achievement: 11/04/22 Potential to Achieve Goals: Good Progress towards PT goals: Progressing toward goals    Frequency    Min 1X/week      PT Plan  Update discharge recommendations       AM-PAC PT "6 Clicks" Mobility   Outcome Measure  Help needed turning from your back to your side while in a flat bed without using bedrails?: None Help needed moving from lying on your back to sitting on the side of a flat bed without using bedrails?: None Help needed moving to and from a bed to a chair (including a wheelchair)?: None Help needed standing up from a chair using your arms (e.g., wheelchair or bedside chair)?: None Help needed to walk in hospital room?: A Little Help needed climbing 3-5 steps with a railing? : A Lot 6 Click Score: 21    End of Session Equipment Utilized During Treatment: Gait belt Activity Tolerance: Patient limited by pain;Patient tolerated treatment well Patient left: in bed;with call bell/phone within reach Nurse Communication: Mobility status PT Visit Diagnosis: Unsteadiness on feet (R26.81);Other abnormalities of gait and mobility (R26.89)     Time: 5784-6962 PT Time Calculation (min) (ACUTE ONLY): 34 min  Charges:    $Gait Training: 8-22 mins $Therapeutic Activity: 8-22 mins PT General Charges $$ ACUTE PT VISIT: 1 Visit                      Harrel Carina, DPT, CLT  Acute Rehabilitation Services Office: [phone] (Secure chat preferred)    Claudia Desanctis 10/22/2022, 11:42 AM

## 2022-10-22 NOTE — Discharge Summary (Signed)
Physician Discharge Summary  ARBOR STORMONT KGM:[id number] DOB: 1985/12/18 DOA: 10/14/2022  PCP: Salli Real, MD  Admit date: 10/14/2022 Discharge date: 10/22/2022  Admitted From: Home Disposition: Inpatient psychiatry  Recommendations for Outpatient Follow-up:  Follow up with PCP in 1-2 weeks Please obtain BMP/CBC in one week your next doctors visit.  Doxycycline p.o. for 7 days AFO boot to be worn for right lower extremity Outpatient EMG study recommended.  Should follow-up with outpatient orthopedic, Dr. Susa Simmonds as needed Psychiatry medications according to their service  Discharge Condition: Stable CODE STATUS: Full code Diet recommendation: Regular  Brief/Interim Summary:  Brief Narrative:  YSATIS Carmen Daniels is a 37 y.o. female with medical history significant of bipolar, substance abuse disorder, prior overdose and questionable suicidal ideation at outside facility.  She presents to the ED after being found down by family.  Nearby there was noted to vomit and multiple medications.  Estimated downtime approximately 20 hours.  Given atypical labs, concern for overdose and subsequent withdrawal hospitalist was called for admission.  Psychiatry was called in consult.  Upon admission she was noted to have acute toxic metabolic encephalopathy and severe rhabdomyolysis.  Patient was started on aggressive IV fluids which slowly improved her CK levels.  During hospitalization also required electrolyte repletion.  Patient was seen by psychiatry who is recommending inpatient psychiatry once medically cleared. Will receive 7 days of p.o. doxycycline for multiple sites of small cellulitis. There is concerns of right lower extremity peroneal nerve palsy.  AFO boot ordered. Today patient is medically cleared to be discharged with recommendations as stated above.   Assessment & Plan:   Principal Problem:   Rhabdomyolysis Active Problems:   Bipolar disorder (HCC)   AMS (altered mental status)    Non-traumatic rhabdomyolysis   Encephalopathy, toxic   Acute toxic encephalopathy, POA At risk for polysubstance abuse Concern for overdose, incidental versus intentional Secondary to multidrug overdose.  Continue to follow detox protocol.  Patient seen by psychiatry, will require inpatient psych once medically cleared.  Patient started on Caplyta   Rhabdomyolysis, nontraumatic, ongoing CK levels have not trended down.  Right lower extremity erythema/mild fluid-filled lesion RLE palsy, right lower extremity peroneal palsy/compression - Neuropathic pain of right lower extremity on Cymbalta and Neurontin.  There is concerns of possible superficial infection therefore mupirocin ointment and doxycycline for total of 7 days. - MRI of lumbar spine shows chronic lumbar arthropathy.  Follow-up outpatient -May benefit from outpatient EMG Previously seen Dr Susa Simmonds from ortho for Bimalleolar fx in Dec 2023, Will see if she can benefit from orthotic to dorsiflex her foot. Will order AFO brace, or we can change to lace up if needed to avoid prolong dorsiflex and chronic contracture. Spoke with Dr Susa Simmonds.    High risk for withdrawals  -Given patient's UDS panel positive for benzos as well as opiates will follow for possible withdrawals. -Avoid any sedating medications in the interim. -Phenobarbital taper initiated at intake   Diarrhea, resolved Hypokalemia/hypophosphatemia/hypomagnesemia Aggressive repletion.     Lactic acidosis Improved with fluids   DVT prophylaxis: Lovenox Code Status:   Code Status: Full Code Family Communication: None present   Status is: Inpatient Medically cleared for inptn psych   Consultants:  Psychiatry   Procedures:  None    Discharge Diagnoses:  Principal Problem:   Rhabdomyolysis Active Problems:   Other bipolar disorder (HCC)   AMS (altered mental status)   Non-traumatic rhabdomyolysis   Encephalopathy, toxic   Polysubstance abuse  (HCC)  Consultations: Psychiatry  Subjective: Doing well no complaints at this time.  Doing well.  Discharge Exam: Vitals:   10/22/22 0452 10/22/22 0820  BP: (!) 141/77 134/88  Pulse: 87 91  Resp: 18 18  Temp: 99.3 F (37.4 C) 98.9 F (37.2 C)  SpO2: 99% 100%   Vitals:   10/21/22 1439 10/21/22 2041 10/22/22 0452 10/22/22 0820  BP: (!) 146/91 131/89 (!) 141/77 134/88  Pulse: 90 99 87 91  Resp: 18 18 18 18   Temp: 98.9 F (37.2 C) 98.9 F (37.2 C) 99.3 F (37.4 C) 98.9 F (37.2 C)  TempSrc:  Oral Oral   SpO2: 99% 100% 99% 100%  Weight:      Height:        General: Pt is alert, awake, not in acute distress Cardiovascular: RRR, S1/S2 +, no rubs, no gallops Respiratory: CTA bilaterally, no wheezing, no rhonchi Abdominal: Soft, NT, ND, bowel sounds + Extremities: no edema, no cyanosis right foot palsy  Discharge Instructions   Allergies as of 10/22/2022       Reactions   Keflex [cephalexin] Anaphylaxis   Has tolerated amoxicillin 2019/2020        Medication List     STOP taking these medications    clonazePAM 1 MG tablet Commonly known as: KLONOPIN   haloperidol 5 MG tablet Commonly known as: HALDOL   naproxen 500 MG tablet Commonly known as: NAPROSYN   oxyCODONE 5 MG immediate release tablet Commonly known as: Roxicodone   tiZANidine 4 MG capsule Commonly known as: ZANAFLEX   traZODone 100 MG tablet Commonly known as: DESYREL       TAKE these medications    beclomethasone 40 MCG/ACT inhaler Commonly known as: QVAR Inhale 1 puff into the lungs 2 (two) times daily as needed (asthma).   Botox 100 units Solr injection Generic drug: botulinum toxin Type A Inject 200 Units into the muscle every 3 (three) months. Inject into head and neck muscles   Botox 200 units injection Generic drug: botulinum toxin Type A To be administered by MD. Inject 200 units intramuscularly every 3 months.   doxycycline 100 MG tablet Commonly known as:  VIBRA-TABS Take 1 tablet (100 mg total) by mouth every 12 (twelve) hours for 7 days.   DULoxetine 60 MG capsule Commonly known as: CYMBALTA Take 2 capsules (120 mg total) by mouth daily.   gabapentin 400 MG capsule Commonly known as: NEURONTIN Take 400 mg by mouth 4 (four) times daily.   lumateperone tosylate 42 MG capsule Commonly known as: Caplyta Take 1 capsule (42 mg total) by mouth daily.   metoCLOPramide 10 MG tablet Commonly known as: REGLAN Take 1 tablet (10 mg total) by mouth every 6 (six) hours.   ONE-A-DAY WOMENS FORMULA PO Take 1 tablet by mouth daily.   Qulipta 60 MG Tabs Generic drug: Atogepant Take 60 mg by mouth daily. What changed: additional instructions   SUMAtriptan 100 MG tablet Commonly known as: IMITREX May repeat in 2 hours if headache persists or recurs. Do no refill in less than 30 days        Allergies  Allergen Reactions   Keflex [Cephalexin] Anaphylaxis    Has tolerated amoxicillin 2019/2020    You were cared for by a hospitalist during your hospital stay. If you have any questions about your discharge medications or the care you received while you were in the hospital after you are discharged, you can call the unit and asked to speak with the hospitalist on  call if the hospitalist that took care of you is not available. Once you are discharged, your primary care physician will handle any further medical issues. Please note that no refills for any discharge medications will be authorized once you are discharged, as it is imperative that you return to your primary care physician (or establish a relationship with a primary care physician if you do not have one) for your aftercare needs so that they can reassess your need for medications and monitor your lab values.  You were cared for by a hospitalist during your hospital stay. If you have any questions about your discharge medications or the care you received while you were in the hospital after  you are discharged, you can call the unit and asked to speak with the hospitalist on call if the hospitalist that took care of you is not available. Once you are discharged, your primary care physician will handle any further medical issues. Please note that NO REFILLS for any discharge medications will be authorized once you are discharged, as it is imperative that you return to your primary care physician (or establish a relationship with a primary care physician if you do not have one) for your aftercare needs so that they can reassess your need for medications and monitor your lab values.  Please request your Prim.MD to go over all Hospital Tests and Procedure/Radiological results at the follow up, please get all Hospital records sent to your Prim MD by signing hospital release before you go home.  Get CBC, CMP, 2 view Chest X ray checked  by Primary MD during your next visit or SNF MD in 5-7 days ( we routinely change or add medications that can affect your baseline labs and fluid status, therefore we recommend that you get the mentioned basic workup next visit with your PCP, your PCP may decide not to get them or add new tests based on their clinical decision)  On your next visit with your primary care physician please Get Medicines reviewed and adjusted.  If you experience worsening of your admission symptoms, develop shortness of breath, life threatening emergency, suicidal or homicidal thoughts you must seek medical attention immediately by calling 911 or calling your MD immediately  if symptoms less severe.  You Must read complete instructions/literature along with all the possible adverse reactions/side effects for all the Medicines you take and that have been prescribed to you. Take any new Medicines after you have completely understood and accpet all the possible adverse reactions/side effects.   Do not drive, operate heavy machinery, perform activities at heights, swimming or participation  in water activities or provide baby sitting services if your were admitted for syncope or siezures until you have seen by Primary MD or a Neurologist and advised to do so again.  Do not drive when taking Pain medications.   Procedures/Studies: MR LUMBAR SPINE WO CONTRAST  Result Date: 10/21/2022 CLINICAL DATA:  Lumbar radiculopathy EXAM: MRI LUMBAR SPINE WITHOUT CONTRAST TECHNIQUE: Multiplanar, multisequence MR imaging of the lumbar spine was performed. No intravenous contrast was administered. COMPARISON:  Lumbar spine CT 10/14/2022, lumbar spine MRI 07/23/2020 FINDINGS: Segmentation: Standard; the lowest formed disc space is designated L5-S1. Alignment: There is mild dextrocurvature centered at the thoracolumbar junction. There is no antero or retrolisthesis. Vertebrae: Vertebral body heights are preserved. Background marrow signal is normal. There is no suspicious marrow signal abnormality or marrow edema. Conus medullaris and cauda equina: Conus extends to the L1-L2 level. Conus and cauda equina appear  normal. Paraspinal and other soft tissues: Unremarkable. Disc levels: T12-L1: No significant spinal canal or neural foraminal stenosis L1-L2: There is mild disc desiccation without significant spinal canal or neural foraminal stenosis, unchanged. L2-L3: Mild facet arthropathy without significant spinal canal or neural foraminal stenosis, unchanged. L3-L4: There is a small left foraminal annular fissure and a small right foraminal protrusion without significant spinal canal or neural foraminal stenosis. The protrusion is minimally increased since 2022. L4-L5: There is mild disc desiccation with a minimal bulge and right annular fissure, and moderate left and mild right facet arthropathy with left larger than right effusions but no significant spinal canal or neural foraminal stenosis. No osseous erosion or destruction to suggest septic arthritis. The facet arthropathy and effusions have worsened since 2022.  L5-S1: Mild left worse than right facet arthropathy without significant spinal canal or neural foraminal stenosis, unchanged. IMPRESSION: 1. Moderate left and mild right facet arthropathy at L4-L5 with effusions, worsened since 2022. No osseous destruction or erosion to suggest septic arthritis. 2. Moderate facet arthropathy at L5-S1, similar to 2022. 3. No significant spinal canal or neural foraminal stenosis at any level. Electronically Signed   By: Lesia Hausen M.D.   On: 10/21/2022 16:44   CT Lumbar Spine Wo Contrast  Result Date: 10/14/2022 CLINICAL DATA:  Unwitnessed fall, found on the floor in her bathroom by boyfriend. Unknown trauma. EXAM: CT LUMBAR SPINE WITHOUT CONTRAST TECHNIQUE: Multidetector CT imaging of the lumbar spine was performed without intravenous contrast administration. Multiplanar CT image reconstructions were also generated. RADIATION DOSE REDUCTION: This exam was performed according to the departmental dose-optimization program which includes automated exposure control, adjustment of the mA and/or kV according to patient size and/or use of iterative reconstruction technique. COMPARISON:  CT abdomen and pelvis with contrast and reconstructions 06/05/2022 FINDINGS: Segmentation: Standard. Alignment: Slight dextroscoliosis.  No listhesis. Vertebrae: No acute fracture or focal pathologic process is evident. The sacrum and visualized pelvis are intact. Both SI joints are patent with trace spurring. Paraspinal and other soft tissues: Negative. Disc levels: There is preservation of the normal disc heights all lumbar levels from T12-L1. No herniated discs or other significant soft tissue or bony encroachment on the thecal sac are seen. No lumbar levels demonstrate foraminal stenosis. There is mild facet hypertrophy at L2-3 and L3-4, mild-to-moderate facet joint spurring at L4-5 and L5-S1. IMPRESSION: 1. No evidence of fractures. 2. Slight dextroscoliosis. 3. Nonstenosing facet hypertrophy.  Electronically Signed   By: Almira Bar M.D.   On: 10/14/2022 22:18   CT Maxillofacial Wo Contrast  Result Date: 10/14/2022 CLINICAL DATA:  Syncope, facial trauma EXAM: CT MAXILLOFACIAL WITHOUT CONTRAST TECHNIQUE: Multidetector CT imaging of the maxillofacial structures was performed. Multiplanar CT image reconstructions were also generated. RADIATION DOSE REDUCTION: This exam was performed according to the departmental dose-optimization program which includes automated exposure control, adjustment of the mA and/or kV according to patient size and/or use of iterative reconstruction technique. COMPARISON:  None Available. FINDINGS: Osseous: No fracture or mandibular dislocation. No destructive process. Orbits: Negative. No traumatic or inflammatory finding. Sinuses: No acute findings Soft tissues: Negative Limited intracranial: See head CT report IMPRESSION: No facial or orbital fracture. Electronically Signed   By: Charlett Nose M.D.   On: 10/14/2022 22:16   CT Cervical Spine Wo Contrast  Result Date: 10/14/2022 CLINICAL DATA:  Syncope, found down EXAM: CT CERVICAL SPINE WITHOUT CONTRAST TECHNIQUE: Multidetector CT imaging of the cervical spine was performed without intravenous contrast. Multiplanar CT image reconstructions were also generated.  RADIATION DOSE REDUCTION: This exam was performed according to the departmental dose-optimization program which includes automated exposure control, adjustment of the mA and/or kV according to patient size and/or use of iterative reconstruction technique. COMPARISON:  06/29/2020 FINDINGS: Alignment: Normal Skull base and vertebrae: No acute fracture. No primary bone lesion or focal pathologic process. Soft tissues and spinal canal: No prevertebral fluid or swelling. No visible canal hematoma. Disc levels: Degenerative disc disease at C3-4 and C6-7 with disc space narrowing and spurring. Mild bilateral degenerative facet disease. Upper chest: No acute findings.  Other: None IMPRESSION: No acute bony abnormality. Electronically Signed   By: Charlett Nose M.D.   On: 10/14/2022 22:09   DG Ankle Complete Right  Result Date: 10/14/2022 CLINICAL DATA:  Shortness of breath, 5. EXAM: RIGHT ANKLE - COMPLETE 3+ VIEW COMPARISON:  None Available. FINDINGS: There is no evidence of fracture, dislocation, or joint effusion. shob, fall, found down fibular sideplate and screws are present which appear uncomplicated. There is lateral soft tissue swelling. IMPRESSION: Lateral soft tissue swelling. No evidence of acute fracture or dislocation. Hardware appears uncomplicated. Electronically Signed   By: Darliss Cheney M.D.   On: 10/14/2022 22:07   CT HEAD WO CONTRAST  Result Date: 10/14/2022 CLINICAL DATA:  Syncope/presyncope, cerebrovascular cause suspected EXAM: CT HEAD WITHOUT CONTRAST TECHNIQUE: Contiguous axial images were obtained from the base of the skull through the vertex without intravenous contrast. RADIATION DOSE REDUCTION: This exam was performed according to the departmental dose-optimization program which includes automated exposure control, adjustment of the mA and/or kV according to patient size and/or use of iterative reconstruction technique. COMPARISON:  02/07/2021 FINDINGS: Brain: No acute intracranial abnormality. Specifically, no hemorrhage, hydrocephalus, mass lesion, acute infarction, or significant intracranial injury. Vascular: No hyperdense vessel or unexpected calcification. Skull: No acute calvarial abnormality. Sinuses/Orbits: No acute findings Other: None IMPRESSION: No acute intracranial abnormality. Electronically Signed   By: Charlett Nose M.D.   On: 10/14/2022 22:07   DG Hip Port Unilat W or Wo Pelvis 1 View Left  Result Date: 10/14/2022 CLINICAL DATA:  Shortness of breath, fall, found down EXAM: DG HIP (WITH OR WITHOUT PELVIS) 1V PORT LEFT COMPARISON:  None Available. FINDINGS: Hand overlying the right hip compromises assessment. No definite acute  fracture. No dislocation. IMPRESSION: No definite acute fracture or dislocation. Electronically Signed   By: Minerva Fester M.D.   On: 10/14/2022 22:07   DG Chest Portable 1 View  Result Date: 10/14/2022 CLINICAL DATA:  Shortness of breath, fall, found down EXAM: PORTABLE CHEST 1 VIEW COMPARISON:  02/09/2021 FINDINGS: The heart size and mediastinal contours are within normal limits. Both lungs are clear. The visualized skeletal structures are unremarkable. IMPRESSION: No active disease. Electronically Signed   By: Minerva Fester M.D.   On: 10/14/2022 22:05     The results of significant diagnostics from this hospitalization (including imaging, microbiology, ancillary and laboratory) are listed below for reference.     Microbiology: No results found for this or any previous visit (from the past 240 hour(s)).   Labs: BNP (last 3 results) No results for input(s): "BNP" in the last 8760 hours. Basic Metabolic Panel: Recent Labs  Lab 10/18/22 0221 10/19/22 0335 10/20/22 0232 10/20/22 0233 10/21/22 0352 10/22/22 0640  NA 133* 136  --  136 134* 135  K 3.5 3.3*  --  3.0* 3.2* 3.5  CL 99 101  --  102 101 100  CO2 27 25  --  22 26 26   GLUCOSE 89 93  --  103* 101* 119*  BUN 5* <5*  --  <5* <5* <5*  CREATININE 0.67 0.74  --  0.55 0.66 0.67  CALCIUM 7.8* 8.2*  --  7.8* 8.2* 8.7*  MG  --   --  1.5*  --  2.0 1.9  PHOS  --   --  2.4*  --  3.7  --    Liver Function Tests: Recent Labs  Lab 10/17/22 1021 10/18/22 0221 10/19/22 0335 10/20/22 0233 10/22/22 0640  AST 113* 79* 82* 68* 49*  ALT 33 25 29 30  61*  ALKPHOS 68 51 57 52 76  BILITOT 1.2 1.0 1.1 1.4* 1.3*  PROT 6.2* 5.0* 5.5* 4.9* 6.3*  ALBUMIN 2.9* 2.2* 2.5* 2.5* 3.2*   No results for input(s): "LIPASE", "AMYLASE" in the last 168 hours. No results for input(s): "AMMONIA" in the last 168 hours. CBC: Recent Labs  Lab 10/18/22 0221 10/19/22 0335 10/20/22 0233 10/21/22 0352 10/22/22 0640  WBC 8.5 9.2 9.8 13.4* 13.2*   NEUTROABS  --   --   --  9.8*  --   HGB 11.6* 12.3 11.3* 12.0 13.5  HCT 34.6* 35.5* 33.3* 35.1* 39.2  MCV 85.0 85.5 83.5 83.6 82.7  PLT 281 301 300 358 350   Cardiac Enzymes: Recent Labs  Lab 10/18/22 0221 10/19/22 0335 10/20/22 0233 10/21/22 0352 10/22/22 0640  CKTOTAL 2,627* 2,437* 1,603* 624* 237*   BNP: Invalid input(s): "POCBNP" CBG: Recent Labs  Lab 10/20/22 1132  GLUCAP 91   D-Dimer No results for input(s): "DDIMER" in the last 72 hours. Hgb A1c No results for input(s): "HGBA1C" in the last 72 hours. Lipid Profile No results for input(s): "CHOL", "HDL", "LDLCALC", "TRIG", "CHOLHDL", "LDLDIRECT" in the last 72 hours. Thyroid function studies No results for input(s): "TSH", "T4TOTAL", "T3FREE", "THYROIDAB" in the last 72 hours.  Invalid input(s): "FREET3" Anemia work up No results for input(s): "VITAMINB12", "FOLATE", "FERRITIN", "TIBC", "IRON", "RETICCTPCT" in the last 72 hours. Urinalysis    Component Value Date/Time   COLORURINE AMBER (A) 10/14/2022 2332   APPEARANCEUR CLOUDY (A) 10/14/2022 2332   LABSPEC 1.030 10/14/2022 2332   PHURINE 5.0 10/14/2022 2332   GLUCOSEU NEGATIVE 10/14/2022 2332   HGBUR SMALL (A) 10/14/2022 2332   BILIRUBINUR MODERATE (A) 10/14/2022 2332   KETONESUR 5 (A) 10/14/2022 2332   PROTEINUR 100 (A) 10/14/2022 2332   UROBILINOGEN 0.2 06/14/2013 1513   NITRITE NEGATIVE 10/14/2022 2332   LEUKOCYTESUR NEGATIVE 10/14/2022 2332   Sepsis Labs Recent Labs  Lab 10/19/22 0335 10/20/22 0233 10/21/22 0352 10/22/22 0640  WBC 9.2 9.8 13.4* 13.2*   Microbiology No results found for this or any previous visit (from the past 240 hour(s)).   Time coordinating discharge:  I have spent 35 minutes face to face with the patient and on the ward discussing the patients care, assessment, plan and disposition with other care givers. >50% of the time was devoted counseling the patient about the risks and benefits of treatment/Discharge  disposition and coordinating care.   SIGNED:   Miguel Rota, MD  [facility] 10/22/2022, 10:57 AM   If 7PM-7AM, please contact night-coverage

## 2022-10-22 NOTE — Progress Notes (Addendum)
Inpatient Rehab Admissions Coordinator:    CIR consult received. Pt. With significant behavioral health needs and per secure chat with PT, has been transferring to [facility] and walking short distances in her room. I do not think she demonstrates functional deficits significant enough to warrant CIR admit and I do not think CIR can meet her behavioral health needs. Direct d/c to [facility] appears the most appropriate d/c plan.  CIR will sign off.   Megan Salon, MS, CCC-SLP Rehab Admissions Coordinator  [phone] (celll) [phone] (office)

## 2022-10-22 NOTE — Progress Notes (Signed)
PT recommendations are for CIR.  Psych unable to admit her due to her rehab needs. Will consult inptn psych.   Stephania Fragmin MD

## 2022-10-23 DIAGNOSIS — F314 Bipolar disorder, current episode depressed, severe, without psychotic features: Secondary | ICD-10-CM | POA: Diagnosis not present

## 2022-10-23 DIAGNOSIS — T796XXD Traumatic ischemia of muscle, subsequent encounter: Secondary | ICD-10-CM | POA: Diagnosis not present

## 2022-10-23 LAB — CK: Total CK: 146 U/L (ref 38–234)

## 2022-10-23 LAB — MAGNESIUM: Magnesium: 2 mg/dL (ref 1.7–2.4)

## 2022-10-23 LAB — BASIC METABOLIC PANEL
Anion gap: 12 (ref 5–15)
BUN: 6 mg/dL (ref 6–20)
CO2: 24 mmol/L (ref 22–32)
Calcium: 8.7 mg/dL — ABNORMAL LOW (ref 8.9–10.3)
Chloride: 99 mmol/L (ref 98–111)
Creatinine, Ser: 0.85 mg/dL (ref 0.44–1.00)
GFR, Estimated: >60 mL/min (ref >60)
Glucose, Bld: 93 mg/dL (ref 70–99)
Potassium: 3.2 mmol/L — ABNORMAL LOW (ref 3.5–5.1)
Sodium: 135 mmol/L (ref 135–145)

## 2022-10-23 LAB — HEPATITIS PANEL, ACUTE
HCV Ab: NONREACTIVE
Hep A IgM: NONREACTIVE
Hep B C IgM: NONREACTIVE
Hepatitis B Surface Ag: NONREACTIVE

## 2022-10-23 MED ORDER — POTASSIUM CHLORIDE CRYS ER 20 MEQ PO TBCR
40.0000 meq | EXTENDED_RELEASE_TABLET | Freq: Once | ORAL | Status: AC
  Administered 2022-10-23: 40 meq via ORAL
  Filled 2022-10-23: qty 2

## 2022-10-23 NOTE — TOC Progression Note (Addendum)
Transition of Care [facility]) - Progression Note    Patient Details  Name: Carmen Daniels MRN: [id number] Date of Birth: Mar 25, 1985  Transition of [facility]) CM/SW Contact  Mearl Latin, LCSW Phone Number: 10/23/2022, 1:02 PM  Clinical Narrative:    CSW contacted [facility] to confirm if they received referral yesterday. CSW was told that nursing unit would receive a call if central intake decided to accept the patient.   CSW contacted [facility] [phone]) and was told that central intake completes referrals and they would call if they were able to accept patient. CSW confirmed fax number is [phone].  CSW faxed to the following:  Destination  Service Provider  [facility]   [facility]   [facility]   [facility]   [facility]   [facility]   [facility]   [facility]   [facility]   [facility]   [facility]   [facility]   [facility]   [facility]   [facility]          Expected Discharge Plan and Services         Expected Discharge Date: 10/22/22                                     Social Determinants of Health (SDOH) Interventions SDOH Screenings   Food Insecurity: No Food Insecurity (10/15/2022)  Housing: Low Risk  (10/15/2022)  Transportation Needs: No Transportation Needs (10/15/2022)  Utilities: Not At Risk (10/15/2022)  Alcohol Screen: Low Risk  (04/10/2017)  Depression (PHQ2-9): High Risk (08/04/2022)  Financial Resource Strain: Medium Risk (05/07/2022)   Received from [facility], [facility]  Social Connections: Unknown (06/24/2021)   [facility] from [facility]  Stress: Stress Concern Present (05/07/2022)   Received from [facility], [facility]  Tobacco Use: High Risk (10/15/2022)    Readmission Risk Interventions     No data to display

## 2022-10-23 NOTE — Plan of Care (Signed)
  Problem: Clinical Measurements: Goal: Ability to maintain clinical measurements within normal limits will improve Outcome: Progressing Goal: Cardiovascular complication will be avoided Outcome: Progressing   

## 2022-10-23 NOTE — Progress Notes (Signed)
Pt's friend Marchelle Folks called RN at [phone] phone to ask if pt has received her Cymbalta today and if labs were drawn because she couldn't see it on MyChart. Friend was informed that it was given with morning meds and the physician had ordered labs to be drawn. Friend then stated that pt stated she had fallen over 3 hours ago and called the desk but no one had ever come to the room.  This RN informed friend that she had left out of the room about 10 minutes prior and the pt hadn't mentioned anything about a fall.  RN ended the call and went to inquire. Pt stated she was coming back from the bathroom with walker and fell on her bottom over three hours ago.  States she called desk but no one ever came and she got herself back in bed. No new bruises noted on skin reassessment. VSS. Charge RN [place] informed and went to assess pt. Pt states to charge RN that she fell at 2:30. Pt educated that bed alarm to now be set and fall mat placed.

## 2022-10-23 NOTE — Progress Notes (Signed)
PROGRESS NOTE    Carmen Daniels  ZOX:[id number] DOB: May 31, 1985 DOA: 10/14/2022 PCP: Salli Real, MD     Brief Narrative:  Carmen Daniels is a 37 y.o. female with medical history significant of bipolar, substance abuse disorder, prior overdose and questionable suicidal ideation at outside facility.  She presents to the ED after being found down by family.  Nearby there was noted to vomit and multiple medications.  Estimated downtime approximately 20 hours.  Given atypical labs, concern for overdose and subsequent withdrawal hospitalist was called for admission.  Psychiatry was called in consult.  Upon admission she was noted to have acute toxic metabolic encephalopathy and severe rhabdomyolysis.  Patient was started on aggressive IV fluids which slowly improved her CK levels.  During hospitalization also required electrolyte repletion.  Patient was seen by psychiatry who is recommending inpatient psychiatry once medically cleared. Will receive 7 days of p.o. doxycycline for multiple sites of small cellulitis. There is concerns of right lower extremity peroneal nerve palsy.  AFO boot ordered.  Due to patient's functional status, CIR declined admission. Patient requires inpatient psych.  No beds available at [facility], faxed out to outside facilities   Assessment & Plan:   Principal Problem:   Rhabdomyolysis Active Problems:   Bipolar disorder (HCC)   AMS (altered mental status)   Non-traumatic rhabdomyolysis   Encephalopathy, toxic   Acute toxic encephalopathy, POA At risk for polysubstance abuse Concern for overdose, incidental versus intentional Secondary to multidrug overdose.  Transition to inpatient psych.  BH declined at first, thereafter no bed available. Case has been faxed out to other psych facilities.    Rhabdomyolysis, nontraumatic, ongoing CK levels have normalized Mild transaminitis, which is improving. Checking acute hepatitis panel.   Right lower extremity erythema/mild  fluid-filled lesion; improving.  RLE palsy, right lower extremity peroneal palsy/compression - Neuropathic pain of right lower extremity on Cymbalta and Neurontin.  There is concerns of possible superficial infection therefore mupirocin ointment and doxycycline for total of 7 days. - MRI of lumbar spine shows chronic lumbar arthropathy.  Follow-up outpatient -May benefit from outpatient EMG Previously seen Dr Susa Simmonds from ortho for Bimalleolar fx in Dec 2023, now in AFO brace, or we can change to lace up if needed to avoid prolong dorsiflex and chronic contracture. Spoke with Dr Susa Simmonds 8/30.  PT/OT and rehab eval- not a CIR candidate.    High risk for withdrawals  No longer showing any signs of withdrawal   Diarrhea, resolved Hypokalemia/hypophosphatemia/hypomagnesemia Aggressive repletion.     Lactic acidosis Improved with fluids   DVT prophylaxis: Lovenox Code Status:    Full Code Family Communication: Marchelle Folks updated over the phone, and boyfriend at bedside.    Status is: Inpatient Pending Psych Placement.    Consultants:  Psychiatry   Procedures:  None     Subjective: Patient states she is feeling better.  Also admits that her left lower extremity erythema/scab over her knee has significantly improved.  Her right lower extremity neuropathy is also mildly improving but definitely not worse.  Overall does not have any complaints.  Does have slightly poor oral intake which have encouraged her to eat and drink as much as possible. Discussed case with patient's friend Marchelle Folks over the phone and boyfriend at bedside.  All the questions have been answered to my best understanding and knowledge.  Overall I tried to explain that patient's condition continues to slowly improve and is currently medically stable awaiting bed for inpatient psych  Patient's RN present  in the room with me as well.   Examination:  General exam: Appears calm and comfortable  Respiratory system: Clear to  auscultation. Respiratory effort normal. Cardiovascular system: S1 & S2 heard, RRR. No JVD, murmurs, rubs, gallops or clicks. No pedal edema. Gastrointestinal system: Abdomen is nondistended, soft and nontender. No organomegaly or masses felt. Normal bowel sounds heard. Central nervous system: Alert and oriented. No focal neurological deficits. Extremities: Symmetric 5 x 5 power.  Right lower extremity AFO in place Skin: Small healed scab over the left elbow, left knee mild erythema with scab over, right lower extremity close to her ankle area of erythema also improving. Psychiatry: Judgement and insight appear normal. Mood & affect appropriate.       Diet Orders (From admission, onward)     Start     Ordered   10/15/22 1554  Diet regular Room service appropriate? Yes; Fluid consistency: Thin  Diet effective now       Comments: Pt vegetarian  Question Answer Comment  Room service appropriate? Yes   Fluid consistency: Thin      10/15/22 1553            Objective: Vitals:   10/22/22 1354 10/22/22 2000 10/23/22 0543 10/23/22 0827  BP: (!) 116/57 (!) 137/90 127/63 (!) 128/58  Pulse: (!) 106 85 79 98  Resp: 18 18 18 16   Temp: 98.6 F (37 C) 98.6 F (37 C) 98.5 F (36.9 C) 98 F (36.7 C)  TempSrc: Oral Oral Oral Oral  SpO2: 100% 100% 100% 100%  Weight:      Height:       No intake or output data in the 24 hours ending 10/23/22 1110 Filed Weights   10/15/22 0223  Weight: 79.3 kg    Scheduled Meds:  doxycycline  100 mg Oral Q12H   DULoxetine  120 mg Oral Daily   feeding supplement  237 mL Oral BID BM   folic acid  1 mg Oral Daily   gabapentin  300 mg Oral TID   lumateperone tosylate  42 mg Oral Daily   multivitamin with minerals  1 tablet Oral Daily   mupirocin ointment   Nasal BID   nicotine  21 mg Transdermal See admin instructions   sodium chloride flush  10-40 mL Intracatheter Q12H   thiamine  100 mg Oral Daily   Or   thiamine  100 mg Intravenous Daily    Continuous Infusions:  Nutritional status     Body mass index is 24.38 kg/m.  Data Reviewed:   CBC: Recent Labs  Lab 10/18/22 0221 10/19/22 0335 10/20/22 0233 10/21/22 0352 10/22/22 0640  WBC 8.5 9.2 9.8 13.4* 13.2*  NEUTROABS  --   --   --  9.8*  --   HGB 11.6* 12.3 11.3* 12.0 13.5  HCT 34.6* 35.5* 33.3* 35.1* 39.2  MCV 85.0 85.5 83.5 83.6 82.7  PLT 281 301 300 358 350   Basic Metabolic Panel: Recent Labs  Lab 10/19/22 0335 10/20/22 0232 10/20/22 0233 10/21/22 0352 10/22/22 0640 10/23/22 0303  NA 136  --  136 134* 135 135  K 3.3*  --  3.0* 3.2* 3.5 3.2*  CL 101  --  102 101 100 99  CO2 25  --  22 26 26 24   GLUCOSE 93  --  103* 101* 119* 93  BUN <5*  --  <5* <5* <5* 6  CREATININE 0.74  --  0.55 0.66 0.67 0.85  CALCIUM 8.2*  --  7.8*  8.2* 8.7* 8.7*  MG  --  1.5*  --  2.0 1.9 2.0  PHOS  --  2.4*  --  3.7  --   --    GFR: Estimated Creatinine Clearance: 100.3 mL/min (by C-G formula based on SCr of 0.85 mg/dL). Liver Function Tests: Recent Labs  Lab 10/17/22 1021 10/18/22 0221 10/19/22 0335 10/20/22 0233 10/22/22 0640  AST 113* 79* 82* 68* 49*  ALT 33 25 29 30  61*  ALKPHOS 68 51 57 52 76  BILITOT 1.2 1.0 1.1 1.4* 1.3*  PROT 6.2* 5.0* 5.5* 4.9* 6.3*  ALBUMIN 2.9* 2.2* 2.5* 2.5* 3.2*   No results for input(s): "LIPASE", "AMYLASE" in the last 168 hours. No results for input(s): "AMMONIA" in the last 168 hours. Coagulation Profile: No results for input(s): "INR", "PROTIME" in the last 168 hours. Cardiac Enzymes: Recent Labs  Lab 10/19/22 0335 10/20/22 0233 10/21/22 0352 10/22/22 0640 10/23/22 0303  CKTOTAL 2,437* 1,603* 624* 237* 146   BNP (last 3 results) No results for input(s): "PROBNP" in the last 8760 hours. HbA1C: No results for input(s): "HGBA1C" in the last 72 hours. CBG: Recent Labs  Lab 10/20/22 1132  GLUCAP 91   Lipid Profile: No results for input(s): "CHOL", "HDL", "LDLCALC", "TRIG", "CHOLHDL", "LDLDIRECT" in the last 72  hours. Thyroid Function Tests: No results for input(s): "TSH", "T4TOTAL", "FREET4", "T3FREE", "THYROIDAB" in the last 72 hours. Anemia Panel: No results for input(s): "VITAMINB12", "FOLATE", "FERRITIN", "TIBC", "IRON", "RETICCTPCT" in the last 72 hours. Sepsis Labs: No results for input(s): "PROCALCITON", "LATICACIDVEN" in the last 168 hours.  No results found for this or any previous visit (from the past 240 hour(s)).       Radiology Studies: MR LUMBAR SPINE WO CONTRAST  Result Date: 10/21/2022 CLINICAL DATA:  Lumbar radiculopathy EXAM: MRI LUMBAR SPINE WITHOUT CONTRAST TECHNIQUE: Multiplanar, multisequence MR imaging of the lumbar spine was performed. No intravenous contrast was administered. COMPARISON:  Lumbar spine CT 10/14/2022, lumbar spine MRI 07/23/2020 FINDINGS: Segmentation: Standard; the lowest formed disc space is designated L5-S1. Alignment: There is mild dextrocurvature centered at the thoracolumbar junction. There is no antero or retrolisthesis. Vertebrae: Vertebral body heights are preserved. Background marrow signal is normal. There is no suspicious marrow signal abnormality or marrow edema. Conus medullaris and cauda equina: Conus extends to the L1-L2 level. Conus and cauda equina appear normal. Paraspinal and other soft tissues: Unremarkable. Disc levels: T12-L1: No significant spinal canal or neural foraminal stenosis L1-L2: There is mild disc desiccation without significant spinal canal or neural foraminal stenosis, unchanged. L2-L3: Mild facet arthropathy without significant spinal canal or neural foraminal stenosis, unchanged. L3-L4: There is a small left foraminal annular fissure and a small right foraminal protrusion without significant spinal canal or neural foraminal stenosis. The protrusion is minimally increased since 2022. L4-L5: There is mild disc desiccation with a minimal bulge and right annular fissure, and moderate left and mild right facet arthropathy with left  larger than right effusions but no significant spinal canal or neural foraminal stenosis. No osseous erosion or destruction to suggest septic arthritis. The facet arthropathy and effusions have worsened since 2022. L5-S1: Mild left worse than right facet arthropathy without significant spinal canal or neural foraminal stenosis, unchanged. IMPRESSION: 1. Moderate left and mild right facet arthropathy at L4-L5 with effusions, worsened since 2022. No osseous destruction or erosion to suggest septic arthritis. 2. Moderate facet arthropathy at L5-S1, similar to 2022. 3. No significant spinal canal or neural foraminal stenosis at any level. Electronically Signed  By: Lesia Hausen M.D.   On: 10/21/2022 16:44           LOS: 8 days   Time spent= 35 mins    Miguel Rota, MD [facility]  If 7PM-7AM, please contact night-coverage  10/23/2022, 11:10 AM

## 2022-10-23 NOTE — Consult Note (Signed)
Addendum: I did review EKG yesterday and there was a large improvement in qtc.   [name] Consult Evaluation  Service Date: October 23, 2022 LOS:  LOS: 8 days    Primary Psychiatric Diagnoses  Bipolar I Disorder 2.  Borderline Personality Disorder 3.  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 female admitted medically on 10/14/2022  8:08 PM for being found missing and unconscious surrounded by vomit and pills. She carries the psychiatric diagnoses of bipolar I disorder, borderline personality disorder, chronic pain syndrome, and has a past medical history of migraines, seasonal allergies, asthma, ovarian cysts, pneumonia, past intentional overdose, allergies, seizures . She reports current drug use. She reports that she does not drink alcohol. Psychiatry was consulted for concern for suicide attempt, medication management by Azucena Fallen, MD  on 10/15/2022.   Her initial presentation of altered mental status is most consistent with a likely unintentional drug overdose. She meets criteria for drug overdose based on patient report, toxicology results. Current outpatient psychotropic medications include atogepant, beclomethasone, clonazepam, duloxetine, gabapentin, haloperidol, lumateperone tosylate, metoclopramide, oxycodone, sumatriptan, trazodone and historically she has had a mixed response to these medications. They are prescribed bothy by her outpt psych NP [name]) and PCP.  She was compliant with medications prior to admission as evidenced by chart review, although there is some concern for abuse of klonopin in ED notes. On initial examination, patient was too somnolent for full exam - remained somnolent on re-eval 8/24 but was at least more interactive. Pt on subsequent exams has been pleasant and cooperative with the interviewer. She answers all questions to the best of her ability.   Please see plan below for detailed  recommendations. Still recommending a voluntary hospitalization, but this has been complicated by the patient's unwillingness to use her walker, equipment that is preferred at most [facility] facilities. PT report still indicates that pt has significant mobility challenges over longer distances and requires a wheelchair for that, but could do shorter distances with a rolling walker.    Diagnoses:  Active Hospital problems: Principal Problem:   Rhabdomyolysis Active Problems:   AMS (altered mental status)   Non-traumatic rhabdomyolysis   Encephalopathy, toxic   Polysubstance abuse (HCC)   Severe bipolar I disorder, most recent episode depressed (HCC)     Plan   ## Psychiatric Medication Recommendations:  -- Continue Cymbalta 120 mg (outpatient regimen) for depression, neuropathic pain      -- Consider switching her primary antidepressant agent at next hospitalization - transaminitis and reduced effectiveness. -- Continue lumateperone tosylate (caplyta) for bipolar disorder -- Continue trazodone 25 mg for sleep -- Continue gabapentin at 300 mg TID for neuropathic pain, anxiety  ## Medical Decision Making Capacity:  Capacity was not formally addressed during this encounter; however, the patient gave clear responses to all questions and appeared to understand. Patient appears to have capacity.  ## Further Work-up:  -- Per primary  -- most recent EKG on 8/28 had QtC of 471.  -- Pertinent labwork reviewed earlier this admission includes: UDS (+amphetamines, benzos, opiates, cocaine, THC), elevated lactic acid, urinalysis (protein, bilirubin), elevated CK (Trend [phone]), elevated glucose Drug use: yes.   10/14/22 23:32  Amphetamines POSITIVE !  Barbiturates NONE DETECTED  Benzodiazepines POSITIVE !  Opiates POSITIVE !  COCAINE POSITIVE !  Tetrahydrocannabinol POSITIVE !   ## Disposition:  -- We recommend inpatient psychiatric hospitalization after medical hospitalization.  Patient is under voluntary admission status at  this time; please IVC if attempts to leave hospital.  Patient is aware of our intention and has voiced willingness to go voluntary to behavioral health hospitalization, she would like to go to drug rehab afterwards.  ## Behavioral / Environmental:  --   Patients with borderline personality traits/disorder often use the language of physical pain to communicate both physical and emotional suffering. It is important to address pain complaints as they arise and attempt to identify an etiology, either organic or psychiatric. In patients with chronic pain, it is important to have a discussion with the patient about expectations about pain control.  ## Safety and Observation Level:  - Based on my clinical evaluation, I estimate the patient to be at moderate risk of self harm in the current setting - At this time, we recommend a standard level of observation. This decision is based on my review of the chart including patient's history and current presentation, interview of the patient, mental status examination, and consideration of suicide risk including evaluating suicidal ideation, plan, intent, suicidal or self-harm behaviors, risk factors, and protective factors. This judgment is based on our ability to directly address suicide risk, implement suicide prevention strategies and develop a safety plan while the patient is in the clinical setting. Please contact our team if there is a concern that risk level has changed.  Suicide risk assessment  Patient has following modifiable risk factors for suicide: recklessness, which we are addressing by recommending psychiatric hospitalization and drug rehab.   Patient has following non-modifiable or demographic risk factors for suicide: history of suicide attempt, history of self harm behavior, and psychiatric hospitalization  Patient has the following protective factors against suicide: Supportive family, Supportive  friends, and Cultural, spiritual, or religious beliefs that discourage suicide  Thank you for this consult request. Recommendations have been communicated to the primary team.  We will follow at this time.   Thedore Mins, MD  Psychiatric and Social History   Relevant Aspects of Hospital Course:  Admitted on 10/14/2022 for found down at home by boyfriend. The Patient's family found her on the floor around vomit and pills. She reported been down for around 20 hours. She had bruising and wounds on her head and side. She was arousable however was noted to have episodes of apnea. Patient's family was concerned that she may be having withdrawal to fentanyl or benzos. Labs were obtained on presentation which were revealed WBC 24.4, hemoglobin 16.8, platelets 410, creatinine 1, CK 14,390, lactic acid 2.1, UDS positive for opioids, cocaine, benzos, amphetamines, THC. Chest x-ray showed no acute disease. X-ray of hip and ankle showed no evidence of fracture CT of spine showed no acute abnormalities patient was admitted for further workup. On admission she was difficult to arouse.    Progress Note: 10/23/22 Patient seen face to face in her hospital bed. She report favorable response to her mental health medications as evidenced by improved mental health. However, she is still endorsing body pain especially in her right foot, left lower extremity pain has improved dramatically. She reports being sad occasionally due to her current status but denies delusions, psychosis and self harming thoughts.  Patient Report:   8/30: Pt seen at bedside. Pt looked improved relative to yesterday, she is still voicing pain in her R foot. Bilateral LE has improved dramatically relative to yesterday. Pt was complaining about being a vegetarian and not having good options for protein. Offered her milk chocolate ensure to assist her with nutrition. Pt denies SI/HI/AVH.  Permission to call bf but not mother or sister. Please do not  call her mother or sister.  Psychiatric ROS 08/29 Mood Symptoms Lower depression today. Looking forward to discharge.  Manic Symptoms Patient politely answered questions about symptoms of mania but denied any recently.  Anxiety Symptoms Lower anxiety today. Pt appeared visibly calmer than yesterday.  Trauma Symptoms Pt stated she has few symptoms currently.  Psychosis Symptoms Denies.  Collateral information:  Attempted to call eric seel (boyfriend) at # in chart; VM not set up so no message left.   Psychiatric History:  Information collected from chart review  Prev Dx/Sx: bipolar 1 disorder, borderline personality disorder, polysubstance use disorder, social anxiety disorder, anxiety, depression, chronic pain, seizures Current Psych Provider: Shanna Cisco, NP (Nurse Practitioner)  Current Meds: Cymbalta 120 mg daily, Caplyta 42 mg daily, Haldol 2 mg nightly, and trazodone 100 mg nightly as needed.  Patient is also prescribed Ambien 10 mg nightly,  gabapentin 400 mg 4 times daily, and Klonopin 1 mg 4 times daily  Previous Med Trials: Long medical history, most psychotropics have been attempted Therapy: Yes  Prior ECT: None Prior Psych Hospitalization: [facility]  05/07/2022, [facility]  03/14/2021, [facility] 2019, [facility] 2009 Prior Self Harm: Past suicidal attempts: 2023, 2021, past self harm, Prior Violence: yes  Family Psych History:Depression in her paternal grandmother;  Family Hx suicide: None found on chart review  Social History:  Access to weapons: Denies access  Tobacco use: yes, 1 ppd Alcohol use: No  8/23: BF in the room reports that she has been using fentanyl recreationally as well as takes a large amount of Klonopin which is prescribed to her, however she has been out of fentanyl for 2 days and not been taking her benzos, concern for acute opioid as well as benzodiazepine withdrawal.   Exam Findings  Psychiatric Specialty  Exam:  Presentation  General Appearance: Casual; Fairly Groomed Eye Contact:Good Speech:Clear and Coherent; Normal Rate Speech Volume:Normal Handedness:Right  Mood and Affect  Mood:Anxious; Depressed Affect:Appropriate; Congruent  Thought Process  Thought Processes:Coherent; Goal Directed; Linear Descriptions of Associations:Intact Orientation:Full (Time, Place and Person) Thought Content:Logical History of Schizophrenia/Schizoaffective disorder:No data recorded Duration of Psychotic Symptoms:No data recorded Hallucinations:Hallucinations: None Ideas of Reference:None Suicidal Thoughts:Suicidal Thoughts: No Homicidal Thoughts:Homicidal Thoughts: No  Sensorium  Memory:Immediate Good; Recent Good; Remote Good Judgment:Fair Insight:Fair  Executive Functions  Concentration:Good Attention Span:Good Recall:Good Fund of Knowledge:Good Language:Good  Psychomotor Activity  Psychomotor Activity:Psychomotor Activity: Normal  Assets  Assets:Intimacy; Communication Skills  Sleep  Sleep:Sleep: Fair  Physical Exam: Vital signs:  Temp:  [98 F (36.7 C)-98.6 F (37 C)] 98 F (36.7 C) (08/31 0827) Pulse Rate:  [79-98] 98 (08/31 0827) Resp:  [16-18] 16 (08/31 0827) BP: (127-137)/(58-90) 128/58 (08/31 0827) SpO2:  [100 %] 100 % (08/31 0827)  Physical Exam Vitals and nursing note reviewed.  Constitutional:      Appearance: She is ill-appearing.  HENT:     Head: Normocephalic.  Pulmonary:     Effort: Pulmonary effort is normal.  Musculoskeletal:        General: Signs of injury present.  Skin:    General: Skin is dry.     Findings: Bruising present.  Neurological:     Mental Status: She is alert and oriented to person, place, and time.  Psychiatric:        Behavior: Behavior normal.        Judgment: Judgment normal.     Blood pressure Marland Kitchen)  128/58, pulse 98, temperature 98 F (36.7 C), temperature source Oral, resp. rate 16, height 5\' 11"  (1.803 m), weight 79.3  kg, SpO2 100%. Body mass index is 24.38 kg/m.   Other History   These have been pulled in through the EMR, reviewed, and updated if appropriate.   Family History:   The patient's family history includes Arthritis in her maternal grandfather and maternal grandmother; Asthma in an other family member; COPD in an other family member; Cancer in an other family member; Depression in her paternal grandmother; Healthy in her father and mother; Hyperlipidemia in an other family member; Hypertension in an other family member; Stroke in an other family member.  Medical History: Past Medical History:  Diagnosis Date   Allergic rhinitis    Anxiety    Arthritis    Asthma    Bipolar disorder (HCC)    Borderline personality disorder (HCC)    Bupropion overdose    Chronic pain syndrome    Depression    Family history of adverse reaction to anesthesia    Mom - N/V   Fibromyalgia    generalized   Hallucinations    02/08/22- have cleared mom said.   Migraines    Nasal polyps    OCD (obsessive compulsive disorder)    Ovarian cyst    left   Pneumonia    2021   PONV (postoperative nausea and vomiting)    Schizophrenia (HCC)    schizoaffective   Seizures (HCC)    patient attenped sudide by tak alot of her medication,.    Surgical History: Past Surgical History:  Procedure Laterality Date   ABDOMINAL HYSTERECTOMY     LAPAROSCOPIC TUBAL LIGATION Bilateral 06/13/2019   Procedure: LAPAROSCOPIC TUBAL LIGATION;  Surgeon: Hermina Staggers, MD;  Location:  SURGERY CENTER;  Service: Gynecology;  Laterality: Bilateral;  FILSHIE CLIPS   NASAL SINUS SURGERY     ORIF ANKLE FRACTURE Right 02/09/2022   Procedure: OPEN TREATMENT OF RIGHT BIMALLEOLAR ANKLE FRACTURE;  Surgeon: Terance Hart, MD;  Location: [facility] OR;  Service: Orthopedics;  Laterality: Right;   TUBAL LIGATION     tubes in ears     VAGINAL HYSTERECTOMY Bilateral 01/15/2020   Procedure: HYSTERECTOMY VAGINAL;  Surgeon: Hermina Staggers, MD;  Location: [facility] OR;  Service: Gynecology;  Laterality: Bilateral;   WISDOM TOOTH EXTRACTION      Medications:   Current Facility-Administered Medications:    acetaminophen (TYLENOL) tablet 650 mg, 650 mg, Oral, Q6H PRN, 650 mg at 10/20/22 1730 **OR** acetaminophen (TYLENOL) suppository 650 mg, 650 mg, Rectal, Q6H PRN, Dorrell, Robert, MD   alum & mag hydroxide-simeth (MAALOX/MYLANTA) 200-200-20 MG/5ML suspension 30 mL, 30 mL, Oral, Q4H PRN, Azucena Fallen, MD, 30 mL at 10/20/22 0045   doxycycline (VIBRA-TABS) tablet 100 mg, 100 mg, Oral, Q12H, Amin, Ankit C, MD, 100 mg at 10/23/22 0857   DULoxetine (CYMBALTA) DR capsule 120 mg, 120 mg, Oral, Daily, Margaretmary Dys, MD, 120 mg at 10/23/22 0857   feeding supplement (ENSURE ENLIVE / ENSURE PLUS) liquid 237 mL, 237 mL, Oral, BID BM, Margaretmary Dys, MD, 237 mL at 10/23/22 1402   folic acid (FOLVITE) tablet 1 mg, 1 mg, Oral, Daily, Dorrell, Robert, MD, 1 mg at 10/23/22 0857   gabapentin (NEURONTIN) capsule 300 mg, 300 mg, Oral, TID, Margaretmary Dys, MD, 300 mg at 10/23/22 0857   guaiFENesin (ROBITUSSIN) 100 MG/5ML liquid 5 mL, 5 mL, Oral, Q4H PRN, Amin, Ankit C,  MD   hydrALAZINE (APRESOLINE) injection 10 mg, 10 mg, Intravenous, Q4H PRN, Amin, Ankit C, MD   ibuprofen (ADVIL) tablet 600 mg, 600 mg, Oral, Q4H PRN, Azucena Fallen, MD, 600 mg at 10/23/22 1338   ipratropium-albuterol (DUONEB) 0.5-2.5 (3) MG/3ML nebulizer solution 3 mL, 3 mL, Nebulization, Q4H PRN, Amin, Ankit C, MD   loperamide (IMODIUM) capsule 4 mg, 4 mg, Oral, PRN, Azucena Fallen, MD, 4 mg at 10/23/22 4098   lumateperone tosylate (CAPLYTA) capsule 42 mg, 42 mg, Oral, Daily, Cinderella, Margaret A, 42 mg at 10/23/22 0857   metoprolol tartrate (LOPRESSOR) injection 5 mg, 5 mg, Intravenous, Q4H PRN, Amin, Ankit C, MD   multivitamin with minerals tablet 1 tablet, 1 tablet, Oral, Daily, Dorrell, Robert, MD, 1  tablet at 10/23/22 0857   mupirocin ointment (BACTROBAN) 2 %, , Nasal, BID, Amin, Ankit C, MD, Given at 10/23/22 0857   nicotine (NICODERM CQ - dosed in mg/24 hours) patch 21 mg, 21 mg, Transdermal, See admin instructions, Cinderella, Margaret A, 21 mg at 10/22/22 1828   ondansetron (ZOFRAN) tablet 4 mg, 4 mg, Oral, Q6H PRN, 4 mg at 10/18/22 0936 **OR** ondansetron (ZOFRAN) injection 4 mg, 4 mg, Intravenous, Q6H PRN, Alan Mulder, MD, 4 mg at 10/18/22 2106   phenol (CHLORASEPTIC) mouth spray 1 spray, 1 spray, Mouth/Throat, PRN, Azucena Fallen, MD   senna-docusate (Senokot-S) tablet 1 tablet, 1 tablet, Oral, QHS PRN, Amin, Ankit C, MD   sodium chloride flush (NS) 0.9 % injection 10-40 mL, 10-40 mL, Intracatheter, Q12H, Azucena Fallen, MD, 10 mL at 10/23/22 0858   sodium chloride flush (NS) 0.9 % injection 10-40 mL, 10-40 mL, Intracatheter, PRN, Azucena Fallen, MD   thiamine (VITAMIN B1) tablet 100 mg, 100 mg, Oral, Daily, 100 mg at 10/23/22 0857 **OR** thiamine (VITAMIN B1) injection 100 mg, 100 mg, Intravenous, Daily, Dorrell, Robert, MD   traMADol (ULTRAM) tablet 50 mg, 50 mg, Oral, Q12H PRN, Amin, Ankit C, MD, 50 mg at 10/23/22 0909   traZODone (DESYREL) tablet 25 mg, 25 mg, Oral, QHS PRN, Margaretmary Dys, MD  Allergies: Allergies  Allergen Reactions   Keflex [Cephalexin] Anaphylaxis    Has tolerated amoxicillin 2019/2020

## 2022-10-24 DIAGNOSIS — M6282 Rhabdomyolysis: Secondary | ICD-10-CM | POA: Diagnosis not present

## 2022-10-24 LAB — BASIC METABOLIC PANEL
Anion gap: 9 (ref 5–15)
BUN: 8 mg/dL (ref 6–20)
CO2: 25 mmol/L (ref 22–32)
Calcium: 8.6 mg/dL — ABNORMAL LOW (ref 8.9–10.3)
Chloride: 101 mmol/L (ref 98–111)
Creatinine, Ser: 0.67 mg/dL (ref 0.44–1.00)
GFR, Estimated: >60 mL/min (ref >60)
Glucose, Bld: 133 mg/dL — ABNORMAL HIGH (ref 70–99)
Potassium: 2.9 mmol/L — ABNORMAL LOW (ref 3.5–5.1)
Sodium: 135 mmol/L (ref 135–145)

## 2022-10-24 LAB — CBC
HCT: 38.8 % (ref 36.0–46.0)
Hemoglobin: 13 g/dL (ref 12.0–15.0)
MCH: 28.2 pg (ref 26.0–34.0)
MCHC: 33.5 g/dL (ref 30.0–36.0)
MCV: 84.2 fL (ref 80.0–100.0)
Platelets: 368 10*3/uL (ref 150–400)
RBC: 4.61 MIL/uL (ref 3.87–5.11)
RDW: 13.4 % (ref 11.5–15.5)
WBC: 9.3 10*3/uL (ref 4.0–10.5)
nRBC: 0 % (ref 0.0–0.2)

## 2022-10-24 LAB — COMPREHENSIVE METABOLIC PANEL
ALT: 35 U/L (ref 0–44)
AST: 23 U/L (ref 15–41)
Albumin: 3 g/dL — ABNORMAL LOW (ref 3.5–5.0)
Alkaline Phosphatase: 62 U/L (ref 38–126)
Anion gap: 11 (ref 5–15)
BUN: 8 mg/dL (ref 6–20)
CO2: 24 mmol/L (ref 22–32)
Calcium: 8.6 mg/dL — ABNORMAL LOW (ref 8.9–10.3)
Chloride: 101 mmol/L (ref 98–111)
Creatinine, Ser: 0.67 mg/dL (ref 0.44–1.00)
GFR, Estimated: >60 mL/min (ref >60)
Glucose, Bld: 127 mg/dL — ABNORMAL HIGH (ref 70–99)
Potassium: 3.1 mmol/L — ABNORMAL LOW (ref 3.5–5.1)
Sodium: 136 mmol/L (ref 135–145)
Total Bilirubin: 1.1 mg/dL (ref 0.3–1.2)
Total Protein: 6.3 g/dL — ABNORMAL LOW (ref 6.5–8.1)

## 2022-10-24 LAB — MAGNESIUM: Magnesium: 1.9 mg/dL (ref 1.7–2.4)

## 2022-10-24 MED ORDER — NICOTINE 21 MG/24HR TD PT24
21.0000 mg | MEDICATED_PATCH | Freq: Every day | TRANSDERMAL | Status: DC
  Administered 2022-10-25 – 2022-11-11 (×18): 21 mg via TRANSDERMAL
  Filled 2022-10-24 (×18): qty 1

## 2022-10-24 MED ORDER — POTASSIUM CHLORIDE CRYS ER 20 MEQ PO TBCR
40.0000 meq | EXTENDED_RELEASE_TABLET | Freq: Two times a day (BID) | ORAL | Status: DC
  Administered 2022-10-24 (×2): 40 meq via ORAL
  Filled 2022-10-24 (×2): qty 2

## 2022-10-24 MED ORDER — TRAMADOL HCL 50 MG PO TABS
50.0000 mg | ORAL_TABLET | Freq: Two times a day (BID) | ORAL | Status: DC | PRN
  Administered 2022-10-24 – 2022-11-11 (×36): 50 mg via ORAL
  Filled 2022-10-24 (×37): qty 1

## 2022-10-24 NOTE — Progress Notes (Signed)
Physical Therapy Treatment Patient Details Name: Carmen Daniels MRN: [id number] DOB: 1986/02/07 Today's Date: 10/24/2022   History of Present Illness Pt is 37 yo presenting to [facility] ED 8/22 after being found down by family near vomit and multiple medications. Pt was found to have acute toxic metabolic encephalopathy and severe rhabdomyolysis. PMH: bipolar, substance abuse disorder, fibromyalgia, schizoaffective disorder, BPD, asthma, OCD, prior overdose and questionable suicidal ideation at outside facility.    PT Comments  The pt was agreeable to session, reports she had a fall while mobilizing in the room yesterday (8/31). The pt reports pain and swelling in L ankle, but upon further evaluation, the pt reports the swelling and pain are baseline after admission. The pt was able to demo sit-stand transfer with CGA, but continues to have R ankle instability and drop foot. The pt's friend was present and states she has a walking boot the pt has used before and can bring it in for added ankle stability with mobility. Will return when friend returns to attempt to progress OOB mobility.     If plan is discharge home, recommend the following: Help with stairs or ramp for entrance;A little help with walking and/or transfers;Assistance with cooking/housework;Assist for transportation   Can travel by private vehicle        Equipment Recommendations  Other (comment) (defer equipment needs to post acute. For long distances pt may benefit from W/C if R ankle movement does not improve.)    Recommendations for Other Services       Precautions / Restrictions Precautions Precautions: Fall Precaution Comments: R drop foot Restrictions Weight Bearing Restrictions: No     Mobility  Bed Mobility Overal bed mobility: Independent                  Transfers Overall transfer level: Needs assistance Equipment used: Rolling walker (2 wheels) Transfers: Sit to/from Stand Sit to Stand: Contact guard  assist           General transfer comment: CGA with increased sway on initial stand. pt with increased R ankle eversion with collapse of arch with wt shift to R foot. Pt complete standing marches only and then friend offered to bring in walking boot for increased ankle support. further mobility deferred until boot present          Balance Overall balance assessment: Needs assistance Sitting-balance support: No upper extremity supported, Feet supported Sitting balance-Leahy Scale: Good     Standing balance support: Bilateral upper extremity supported, During functional activity Standing balance-Leahy Scale: Fair Standing balance comment: Pt requires UE support for functional mobility due to R foot deficits                            Cognition Arousal: Alert Behavior During Therapy: WFL for tasks assessed/performed Overall Cognitive Status: Within Functional Limits for tasks assessed                                          Exercises Other Exercises Other Exercises: PROM R ankle, pt cued to assist with DF and PF. Other Exercises: assessment of L ankle after fall on 8/31, pt reports no change in pain, MMT WFL    General Comments General comments (skin integrity, edema, etc.): VSS on RA      Pertinent Vitals/Pain Pain Assessment Pain Assessment: Faces Faces Pain  Scale: Hurts little more Breathing: normal Negative Vocalization: occasional moan/groan, low speech, negative/disapproving quality Facial Expression: smiling or inexpressive Body Language: relaxed Consolability: no need to console PAINAD Score: 1 Pain Location: R foot Pain Descriptors / Indicators: Burning, Discomfort, Grimacing Pain Intervention(s): Limited activity within patient's tolerance, Monitored during session, Repositioned     PT Goals (current goals can now be found in the care plan section) Acute Rehab PT Goals Patient Stated Goal: to go to rehab PT Goal Formulation:  With patient Time For Goal Achievement: 11/04/22 Potential to Achieve Goals: Good Progress towards PT goals: Progressing toward goals    Frequency    Min 1X/week       AM-PAC PT "6 Clicks" Mobility   Outcome Measure  Help needed turning from your back to your side while in a flat bed without using bedrails?: None Help needed moving from lying on your back to sitting on the side of a flat bed without using bedrails?: None Help needed moving to and from a bed to a chair (including a wheelchair)?: A Little Help needed standing up from a chair using your arms (e.g., wheelchair or bedside chair)?: A Little Help needed to walk in hospital room?: A Little Help needed climbing 3-5 steps with a railing? : A Lot 6 Click Score: 19    End of Session   Activity Tolerance: Patient limited by pain;Patient tolerated treatment well Patient left: in bed;with call bell/phone within reach;with bed alarm set Nurse Communication: Mobility status PT Visit Diagnosis: Unsteadiness on feet (R26.81);Other abnormalities of gait and mobility (R26.89)     Time: 1420-1430 PT Time Calculation (min) (ACUTE ONLY): 10 min  Charges:    $Therapeutic Exercise: 8-22 mins PT General Charges $$ ACUTE PT VISIT: 1 Visit                     Vickki Muff, PT, DPT   Acute Rehabilitation Department Office [phone] Secure Chat Communication Preferred   Ronnie Derby 10/24/2022, 4:42 PM

## 2022-10-24 NOTE — Progress Notes (Signed)
HOSPITALIST ROUNDING NOTE Carmen Daniels UJW:[id number]  DOB: [birth year]  DOA: 10/14/2022  PCP: Salli Real, MD  10/24/2022,7:08 AM   LOS: 9 days      Code Status: Presumed full   From: Home  current Dispo: Inpatient psychiatry     87 white female Prior transvaginal hysterectomy 2/2 DUB, neck back spasm previously treated with Botox, migraines, mild persistent asthma Previous bimalleolar fracture 01/2022 which was repaired by Dr. Susa Simmonds of orthopedics  Her psych diagnoses =bipolar personality disorder schizoaffective social anxiety disorder adjustment disorder NOS Prior admission 12/17 through 02/19/2021 acute toxic encephalopathy 2/2 Ambien Klonopin Imitrex + SSI a--> to behavioral health subsequently-that hospitalization acute hypercarbic respiratory failure drug-induced hypotension bradycardia complicated aspiration pneumonia Prior admission liver injury rhabdo 06/2020-creatinine peaked at 8-had bilateral neuropathy lower extremities additionally she was started on neuropathic medications for this issue and was noted to have foot drop previously with a compression nerve palsy Previous management at [facility] (on referral from [place] [facility]) previously on Suboxone Klonopin gabapentin-was told at the time to continue Suboxone and would require comprehensive chronic pain rehab center and was referred to [facility] as well as psychology Last inpatient psych hospitalization apparently 05/07/2022-Rx Lumateperone, Klonopin Cymbalta Eskalith (lithium) Ambien  8/22 was found on the ground with vomit pills scattered on the floor bruising to right eye cold extremities slow to respond-admitted to fentanyl + Klonopin recreationally-thought to have opioid/BZD withdrawal WBC 24 creatinine 1.01 CK14 390 lactic acid 2.1 UDS pan positive amphetamine BZD opiate cocaine [facility] 8/24 psychiatry consulted patient admitting to be depressed and attempted SI 2/2 loss of best friend by overdose Dr. Susa Simmonds was  personally contacted by my partner Dr. Nelson Chimes and patient was recommended to use an AFO and get outpatient EMG studies  8/29 moderate left + right facet arthropathy at L4-L5 plus effusion fidamoxixin destruction, moderate facet arthropathy L5-S1 without any change from 2022   Plan  Toxic metabolic encephalopathy on arrival secondary to polydrug overdose Mentation appears to have cleared, patient is stable for inpatient psychiatry placement once that can be arranged by social work Continue to appreciate psychiatric input-patient stabilizing and can go to psychiatric facility once this is arranged  At high risk for withdrawal Avoiding sedating meds inclusive of benzos and opiates Patient was on a phenobarb taper on arrival and this has been completed  Fall on 8/31 Not witnessed by nursing staff-ambulate expressly with nursing staff use walker May be able to shower as long as is stable and supervised by nursing-nursing to let me know how she does moving around today  Right lower extremity palsy, right lower extremity erythema MRI lumbar spine as above probably contributing to foot drop Dr. Susa Simmonds Ortho contacted by Dr. Chrisandra Netters brace avoid prolonged dorsiflexion and chronic contractures-outpatient therapy initiated Pain control ibuprofen 600 every 4 as needed headache cramping, tramadol 50 every 12 as needed severe pain Tinea gabapentin 300 3 times daily neuropathy  Cellulitis of various lesions on knee, ankle See picture    Erythema felt to be secondary to superficial infection-Rx mupirocin, doxycycline and stop date 10/28/2022 Nonemergent WBC in the next several days  Lactic acidosis on arrival-noninfectious Likely secondary to severe hypo on arrival, last CK was 146 on 8/21 No further workup and only periodic labs at this time  Mild transaminitis during hospitalization 2/2 rhabdo, no further workup currently-outpatient reeval once stabilized  Diarrhea during hospitalization With  electrolyte disturbances Resolved, periodic labs Continues on as needed Imodium-Maalox 30 every 4 as needed  Multiple  psych diagnoses As per psychiatry will continue Cymbalta 120 daily, Caplyta 42 daily, trazodone 25 at bedtime as needed sleep   DVT prophylaxis: SCD  Status is: Inpatient Remains inpatient appropriate because:   Continues to await psych placement    Subjective: Slightly anxious white female-pleasant coherent Does not like the food as is vegetarian and is not eating it so family and friend is bringing Tells me she had a fall at around 2 PM on 8/31-this was not witnessed by nursing who rounded with me today She seems overall fair no fever no chills Asking about shower Mentions that she has numbness now more than tingling in the lower extremity  Objective + exam Vitals:   10/23/22 0827 10/23/22 1659 10/23/22 2006 10/24/22 0524  BP: (!) 128/58 (!) 123/96 136/80 132/88  Pulse: 98 79 77 99  Resp: 16 18 18 16   Temp: 98 F (36.7 C) 98.8 F (37.1 C) 98.8 F (37.1 C) 97.9 F (36.6 C)  TempSrc: Oral Oral Oral   SpO2: 100% 100% 100% 100%  Weight:      Height:       Filed Weights   10/15/22 0223  Weight: 79.3 kg    Examination:  EOMI NCAT multiple tattoos No icterus no pallor neck soft supple Chest clear S1-S2 mild tachycardia Left knee exam as above, left foot exam as above per picture Full neuroexam deferred at this time  Data Reviewed: reviewed   CBC    Component Value Date/Time   WBC 9.3 10/24/2022 0607   RBC 4.61 10/24/2022 0607   HGB 13.0 10/24/2022 0607   HCT 38.8 10/24/2022 0607   PLT 368 10/24/2022 0607   MCV 84.2 10/24/2022 0607   MCH 28.2 10/24/2022 0607   MCHC 33.5 10/24/2022 0607   RDW 13.4 10/24/2022 0607   LYMPHSABS 2.1 10/21/2022 0352   MONOABS 1.2 (H) 10/21/2022 0352   EOSABS 0.1 10/21/2022 0352   BASOSABS 0.0 10/21/2022 0352      Latest Ref Rng & Units 10/24/2022    6:07 AM 10/23/2022    3:03 AM 10/22/2022    6:40 AM  CMP   Glucose 70 - 99 mg/dL 621  93  308   BUN 6 - 20 mg/dL 8  6  <5   Creatinine 6.57 - 1.00 mg/dL 8.46  9.62  9.52   Sodium 135 - 145 mmol/L 135  135  135   Potassium 3.5 - 5.1 mmol/L 2.9  3.2  3.5   Chloride 98 - 111 mmol/L 101  99  100   CO2 22 - 32 mmol/L 25  24  26    Calcium 8.9 - 10.3 mg/dL 8.6  8.7  8.7   Total Protein 6.5 - 8.1 g/dL   6.3   Total Bilirubin 0.3 - 1.2 mg/dL   1.3   Alkaline Phos 38 - 126 U/L   76   AST 15 - 41 U/L   49   ALT 0 - 44 U/L   61      Scheduled Meds:  doxycycline  100 mg Oral Q12H   DULoxetine  120 mg Oral Daily   feeding supplement  237 mL Oral BID BM   folic acid  1 mg Oral Daily   gabapentin  300 mg Oral TID   lumateperone tosylate  42 mg Oral Daily   multivitamin with minerals  1 tablet Oral Daily   mupirocin ointment   Nasal BID   nicotine  21 mg Transdermal See admin instructions  sodium chloride flush  10-40 mL Intracatheter Q12H   thiamine  100 mg Oral Daily   Or   thiamine  100 mg Intravenous Daily   Continuous Infusions:  Time 60 minutes  Rhetta Mura, MD  [facility]

## 2022-10-24 NOTE — Progress Notes (Signed)
Physical Therapy Treatment Patient Details Name: Carmen Daniels MRN: [id number] DOB: [birth year] Today's Date: 10/24/2022   History of Present Illness Pt is 37 yo presenting to [facility] ED 8/22 after being found down by family near vomit and multiple medications. Pt was found to have acute toxic metabolic encephalopathy and severe rhabdomyolysis. PMH: bipolar, substance abuse disorder, fibromyalgia, schizoaffective disorder, BPD, asthma, OCD, prior overdose and questionable suicidal ideation at outside facility.    PT Comments  The pt was able to demo good progress with gait training this session using short cam walker boot (from prior ankle surgery) to support R ankle and manage R drop foot. The pt was able to improve gait pattern and increase R heel strike with gait, but remains dependent on BUE support on RW at this time. She was also limited in endurance to multiple short bouts of ambulation due to onset of fatigue and dizziness. Will continue to benefit from skilled PT acutely to progress functional strength in RLE, improve dynamic stability, and improve endurance. Continue to recommend intensive rehab >3hrs/day to maximize functional recovery and return to independence.     If plan is discharge home, recommend the following: Help with stairs or ramp for entrance;A little help with walking and/or transfers;Assistance with cooking/housework;Assist for transportation   Can travel by private vehicle        Equipment Recommendations  Other (comment) (defer equipment needs to post acute. For long distances pt may benefit from W/C if R ankle movement does not improve.)    Recommendations for Other Services       Precautions / Restrictions Precautions Precautions: Fall Precaution Comments: R drop foot Restrictions Weight Bearing Restrictions: No     Mobility  Bed Mobility Overal bed mobility: Independent                  Transfers Overall transfer level: Needs assistance Equipment  used: Rolling walker (2 wheels) Transfers: Sit to/from Stand Sit to Stand: Contact guard assist           General transfer comment: CGA with use of RW, slightly increased sway in hips with initial stand. completed x4 in session from EOB.    Ambulation/Gait Ambulation/Gait assistance: Contact guard assist Gait Distance (Feet): 15 Feet (x3) Assistive device: Rolling walker (2 wheels) Gait Pattern/deviations: Step-through pattern, Decreased stride length, Decreased step length - left, Decreased weight shift to right Gait velocity: decreased Gait velocity interpretation: <1.31 ft/sec, indicative of household ambulator   General Gait Details: pt used walking boot for increased support of R ankle due to drop foot. The pt was able to complete multiple short bouts of walking in the room with improved heel strike with RLE and no episodes of knee buckling or ankle instability       Balance Overall balance assessment: Needs assistance Sitting-balance support: No upper extremity supported, Feet supported Sitting balance-Leahy Scale: Good     Standing balance support: Bilateral upper extremity supported, During functional activity Standing balance-Leahy Scale: Fair Standing balance comment: Pt requires UE support for functional mobility due to R foot deficits                            Cognition Arousal: Alert Behavior During Therapy: WFL for tasks assessed/performed Overall Cognitive Status: Within Functional Limits for tasks assessed  General Comments: pt with flat affect but able to express needs and answer questions appropriately        Exercises Other Exercises Other Exercises: AAROM R ankle, pt cued to assist with DF and PF. no active DF, trace active PF Other Exercises: assessment of L ankle after fall on 8/31, pt reports no change in pain, MMT [facility]    General Comments General comments (skin integrity, edema, etc.):  VSS on RA      Pertinent Vitals/Pain Pain Assessment Pain Assessment: Faces Faces Pain Scale: Hurts little more Breathing: normal Negative Vocalization: occasional moan/groan, low speech, negative/disapproving quality Facial Expression: smiling or inexpressive Body Language: relaxed Consolability: no need to console PAINAD Score: 1 Pain Location: R foot Pain Descriptors / Indicators: Burning, Discomfort, Grimacing Pain Intervention(s): Limited activity within patient's tolerance, Monitored during session, Repositioned     PT Goals (current goals can now be found in the care plan section) Acute Rehab PT Goals Patient Stated Goal: to go to rehab PT Goal Formulation: With patient Time For Goal Achievement: 11/04/22 Potential to Achieve Goals: Good Progress towards PT goals: Progressing toward goals    Frequency    Min 1X/week       AM-PAC PT "6 Clicks" Mobility   Outcome Measure  Help needed turning from your back to your side while in a flat bed without using bedrails?: None Help needed moving from lying on your back to sitting on the side of a flat bed without using bedrails?: None Help needed moving to and from a bed to a chair (including a wheelchair)?: A Little Help needed standing up from a chair using your arms (e.g., wheelchair or bedside chair)?: A Little Help needed to walk in hospital room?: A Little Help needed climbing 3-5 steps with a railing? : A Lot 6 Click Score: 19    End of Session Equipment Utilized During Treatment: Gait belt (short cam boot on RLE) Activity Tolerance: Patient limited by pain;Patient tolerated treatment well Patient left: in bed;with call bell/phone within reach;with bed alarm set;with family/visitor present Nurse Communication: Mobility status PT Visit Diagnosis: Unsteadiness on feet (R26.81);Other abnormalities of gait and mobility (R26.89)     Time: 1440-1510 PT Time Calculation (min) (ACUTE ONLY): 30 min  Charges:     $Gait Training: 8-22 mins $Therapeutic Exercise: 8-22 mins PT General Charges $$ ACUTE PT VISIT: 1 Visit                     Vickki Muff, PT, DPT   Acute Rehabilitation Department Office [phone] Secure Chat Communication Preferred   Ronnie Derby 10/24/2022, 4:54 PM

## 2022-10-24 NOTE — Plan of Care (Signed)

## 2022-10-25 DIAGNOSIS — F1910 Other psychoactive substance abuse, uncomplicated: Secondary | ICD-10-CM | POA: Diagnosis not present

## 2022-10-25 DIAGNOSIS — F3189 Other bipolar disorder: Secondary | ICD-10-CM | POA: Diagnosis not present

## 2022-10-25 DIAGNOSIS — M6282 Rhabdomyolysis: Secondary | ICD-10-CM | POA: Diagnosis not present

## 2022-10-25 MED ORDER — POTASSIUM CHLORIDE CRYS ER 20 MEQ PO TBCR
80.0000 meq | EXTENDED_RELEASE_TABLET | Freq: Two times a day (BID) | ORAL | Status: DC
  Administered 2022-10-25 – 2022-10-26 (×3): 80 meq via ORAL
  Filled 2022-10-25 (×3): qty 4

## 2022-10-25 NOTE — Plan of Care (Signed)

## 2022-10-25 NOTE — Progress Notes (Signed)
HOSPITALIST ROUNDING NOTE Carmen Daniels WUJ:[id number]  DOB: Jun 15, 1985  DOA: 10/14/2022  PCP: Salli Real, MD  10/25/2022,6:58 AM   LOS: 10 days      Code Status: Presumed full   From: Home  current Dispo: Inpatient psychiatry     31 white female Prior transvaginal hysterectomy 2/2 DUB, neck back spasm previously treated with Botox, migraines, mild persistent asthma Previous bimalleolar fracture 01/2022 which was repaired by Dr. Susa Simmonds of orthopedics  Her psych diagnoses =bipolar personality disorder schizoaffective social anxiety disorder adjustment disorder NOS Prior admission 12/17 through 02/19/2021 acute toxic encephalopathy 2/2 Ambien Klonopin Imitrex + SSI a--> to behavioral health subsequently-that hospitalization acute hypercarbic respiratory failure drug-induced hypotension bradycardia complicated aspiration pneumonia Prior admission liver injury rhabdo 06/2020-creatinine peaked at 8-had bilateral neuropathy lower extremities additionally she was started on neuropathic medications for this issue and was noted to have foot drop previously with a compression nerve palsy Previous management at [facility] pain [facility] (on referral from [place] [facility]) previously on Suboxone Klonopin gabapentin-was told at the time to continue Suboxone and would require comprehensive chronic pain rehab center and was referred to [facility] as well as psychology Last inpatient psych hospitalization apparently 05/07/2022-Rx Lumateperone, Klonopin Cymbalta Eskalith (lithium) Ambien  8/22 was found on the ground with vomit pills scattered on the floor bruising to right eye cold extremities slow to respond-admitted to fentanyl + Klonopin recreationally-thought to have opioid/BZD withdrawal WBC 24 creatinine 1.01 CK14 390 lactic acid 2.1 UDS pan positive amphetamine BZD opiate cocaine [facility] 8/24 psychiatry consulted patient admitting to be depressed and attempted SI 2/2 loss of best friend by overdose Dr. Susa Simmonds was  personally contacted by my partner Dr. Nelson Chimes and patient was recommended to use an AFO and get outpatient EMG studies  8/29 moderate left + right facet arthropathy at L4-L5 plus effusion fidamoxixin destruction, moderate facet arthropathy L5-S1 without any change from 2022   Plan  Toxic metabolic encephalopathy on arrival secondary to polydrug overdose Mentation, patient is stable for inpatient psychiatry placement per social work  At high risk for withdrawal Avoiding sedating meds inclusive of benzos and opiates Patient completed phenobarb taper   Fall on 8/31 Not witnessed by nursing staff-ambulate expressly with nursing staff use walker--some bucking when walking and needs stand-by assit + Walker  Right lower extremity palsy, right lower extremity erythema MRI lumbar spine as above probably contributing to foot drop Dr. Susa Simmonds Ortho contacted by Dr. Chrisandra Netters brace avoid prolonged dorsiflexion and chronic contractures-outpatient therapy initiated Pain control ibuprofen 600 every 4 as needed headache cramping, tramadol 50 every 12 as needed severe pain Tinea gabapentin 300 3 times daily neuropathy  Cellulitis of various lesions on knee, ankle Erythema felt to be secondary to superficial infection-Rx mupirocin, doxycycline and stop date 10/28/2022 STOP LABS other than for lytes  Hypokalemia Replace but with higher dose 80 kdur bid Check am mag  Lactic acidosis on arrival-noninfectious Likely secondary to severe hypo on arrival, last CK was 146 on 8/21 periodic labs at this time  Mild transaminitis during hospitalization 2/2 rhabdo, no further workup currently-outpatient reeval once stabilized  Diarrhea during hospitalization With electrolyte disturbances Resolved, stop imodium  Multiple psych diagnoses As per psychiatry will continue Cymbalta 120 daily, Caplyta 42 daily, trazodone 25 at bedtime as needed sleep   DVT prophylaxis: SCD  Status is: Inpatient Remains inpatient  appropriate because:   Continues to await psych placement    Subjective:  In bed No c/o  Objective + exam Vitals:  10/24/22 0809 10/24/22 1305 10/24/22 1933 10/25/22 0443  BP: 122/79 101/67 (!) 123/90 129/88  Pulse: 86 98 86 96  Resp: 18 19 18 17   Temp: 98 F (36.7 C) 98 F (36.7 C) 98.7 F (37.1 C) 98.7 F (37.1 C)  TempSrc: Oral Oral Oral Oral  SpO2: 100% 100% 100% 100%  Weight:      Height:       Filed Weights   10/15/22 0223  Weight: 79.3 kg    Examination:  Awake coherent in nad--a little anxious Eating better S1 s 2 slight tachy Cta b no rales rhonchi wheeze Abd soft Wound LLE looks some better Power grossly 5/5  Data Reviewed: reviewed   CBC    Component Value Date/Time   WBC 9.3 10/24/2022 0607   RBC 4.61 10/24/2022 0607   HGB 13.0 10/24/2022 0607   HCT 38.8 10/24/2022 0607   PLT 368 10/24/2022 0607   MCV 84.2 10/24/2022 0607   MCH 28.2 10/24/2022 0607   MCHC 33.5 10/24/2022 0607   RDW 13.4 10/24/2022 0607   LYMPHSABS 2.1 10/21/2022 0352   MONOABS 1.2 (H) 10/21/2022 0352   EOSABS 0.1 10/21/2022 0352   BASOSABS 0.0 10/21/2022 0352      Latest Ref Rng & Units 10/24/2022    6:07 AM 10/23/2022    3:03 AM 10/22/2022    6:40 AM  CMP  Glucose 70 - 99 mg/dL 70 - 99 mg/dL 161    096  93  045   BUN 6 - 20 mg/dL 6 - 20 mg/dL 8    8  6   <5   Creatinine 0.44 - 1.00 mg/dL 4.09 - 8.11 mg/dL 9.14    7.82  9.56  2.13   Sodium 135 - 145 mmol/L 135 - 145 mmol/L 135    136  135  135   Potassium 3.5 - 5.1 mmol/L 3.5 - 5.1 mmol/L 2.9    3.1  3.2  3.5   Chloride 98 - 111 mmol/L 98 - 111 mmol/L 101    101  99  100   CO2 22 - 32 mmol/L 22 - 32 mmol/L 25    24  24  26    Calcium 8.9 - 10.3 mg/dL 8.9 - 08.6 mg/dL 8.6    8.6  8.7  8.7   Total Protein 6.5 - 8.1 g/dL 6.3   6.3   Total Bilirubin 0.3 - 1.2 mg/dL 1.1   1.3   Alkaline Phos 38 - 126 U/L 62   76   AST 15 - 41 U/L 23   49   ALT 0 - 44 U/L 35   61      Scheduled Meds:  doxycycline  100  mg Oral Q12H   DULoxetine  120 mg Oral Daily   feeding supplement  237 mL Oral BID BM   gabapentin  300 mg Oral TID   lumateperone tosylate  42 mg Oral Daily   mupirocin ointment   Nasal BID   nicotine  21 mg Transdermal Daily   potassium chloride  40 mEq Oral BID   sodium chloride flush  10-40 mL Intracatheter Q12H   Continuous Infusions:  Time 60 minutes  Rhetta Mura, MD  [facility]

## 2022-10-25 NOTE — Progress Notes (Signed)
Physical Therapy Treatment Patient Details Name: Carmen Daniels MRN: [id number] DOB: March 27, 1985 Today's Date: 10/25/2022   History of Present Illness Pt is 37 yo presenting to [facility] ED 8/22 after being found down by family near vomit and multiple medications. Pt was found to have acute toxic metabolic encephalopathy and severe rhabdomyolysis. PMH: bipolar, substance abuse disorder, fibromyalgia, schizoaffective disorder, BPD, asthma, OCD, prior overdose and questionable suicidal ideation at outside facility.    PT Comments  Pt with poor tolerance to treatment today. Pt able to stand today with supervision however after ~15 seconds pt reports extreme dizziness, ringing in ears, and requested to sit down. BP: 121/80 supine, BP: 122/75 seated, BP: 106/73 standing. Further mobility deferred for safety. DC recs updated to OPPT as pt was denied by CIR and insurance does not cover SNF or HHPT. PT will continue to follow.    If plan is discharge home, recommend the following: Help with stairs or ramp for entrance;A little help with walking and/or transfers;Assistance with cooking/housework;Assist for transportation   Can travel by private vehicle        Equipment Recommendations  Other (comment) (Pt states that she has needed DME at her mothers house however may have difficulty getting it)    Recommendations for Other Services       Precautions / Restrictions Precautions Precautions: Fall Precaution Comments: R drop foot Restrictions Weight Bearing Restrictions: No     Mobility  Bed Mobility Overal bed mobility: Independent                  Transfers Overall transfer level: Needs assistance Equipment used: Rolling walker (2 wheels) Transfers: Sit to/from Stand Sit to Stand: Supervision           General transfer comment: Pt able to stand for BP with no assist however after ~15 seconds requested to sit down due to dizziness.    Ambulation/Gait               General  Gait Details: deferred for safety   Stairs             Wheelchair Mobility     Tilt Bed    Modified Rankin (Stroke Patients Only)       Balance Overall balance assessment: Needs assistance Sitting-balance support: No upper extremity supported, Feet supported Sitting balance-Leahy Scale: Good     Standing balance support: Bilateral upper extremity supported, During functional activity Standing balance-Leahy Scale: Fair Standing balance comment: Pt requires UE support for functional mobility due to R foot deficits                            Cognition Arousal: Alert Behavior During Therapy: WFL for tasks assessed/performed Overall Cognitive Status: Within Functional Limits for tasks assessed                                 General Comments: pt with flat affect but able to express needs and answer questions appropriately        Exercises      General Comments General comments (skin integrity, edema, etc.): BP: 121/80 supine, BP: 122/75 seated, BP: 106/73 standing      Pertinent Vitals/Pain Pain Assessment Pain Assessment: 0-10 Pain Score: 6  Pain Location: R foot Pain Descriptors / Indicators: Burning, Discomfort, Grimacing Pain Intervention(s): Monitored during session, Limited activity within patient's tolerance    Home  Living                          Prior Function            PT Goals (current goals can now be found in the care plan section) Progress towards PT goals: Progressing toward goals    Frequency    Min 1X/week      PT Plan      Co-evaluation              AM-PAC PT "6 Clicks" Mobility   Outcome Measure  Help needed turning from your back to your side while in a flat bed without using bedrails?: None Help needed moving from lying on your back to sitting on the side of a flat bed without using bedrails?: None Help needed moving to and from a bed to a chair (including a wheelchair)?: A  Little Help needed standing up from a chair using your arms (e.g., wheelchair or bedside chair)?: A Little Help needed to walk in hospital room?: A Little Help needed climbing 3-5 steps with a railing? : A Lot 6 Click Score: 19    End of Session Equipment Utilized During Treatment: Gait belt;Other (comment) (CAM boot) Activity Tolerance: Patient limited by pain;Patient tolerated treatment well Patient left: in bed;with call bell/phone within reach Nurse Communication: Mobility status PT Visit Diagnosis: Unsteadiness on feet (R26.81);Other abnormalities of gait and mobility (R26.89)     Time: 4098-1191 PT Time Calculation (min) (ACUTE ONLY): 17 min  Charges:    $Therapeutic Activity: 8-22 mins PT General Charges $$ ACUTE PT VISIT: 1 Visit                     Shela Nevin, PT, DPT Acute Rehab Services [phone]    Gladys Damme 10/25/2022, 3:35 PM

## 2022-10-25 NOTE — Consult Note (Addendum)
Addendum: I did review EKG yesterday and there was a large improvement in qtc.   [name] Consult Evaluation  Service Date: October 25, 2022 LOS:  LOS: 10 days    Primary Psychiatric Diagnoses  Bipolar I Disorder 2.  Borderline Personality Disorder 3.  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 female admitted medically on 10/14/2022  8:08 Daniels for being found missing and unconscious surrounded by vomit and pills. She carries the psychiatric diagnoses of bipolar I disorder, borderline personality disorder, chronic pain syndrome, and has a past medical history of migraines, seasonal allergies, asthma, ovarian cysts, pneumonia, past intentional overdose, allergies, seizures . She reports current drug use. She reports that she does not drink alcohol. Psychiatry was consulted for concern for suicide attempt, medication management by Azucena Fallen, MD  on 10/15/2022.   Her initial presentation of altered mental status was most consistent with an unintentional drug overdose. She met criteria for drug overdose based on patient report, toxicology results. Current outpatient centrally acting medications include atogepant, beclomethasone, clonazepam, duloxetine, gabapentin, haloperidol, lumateperone tosylate, metoclopramide, oxycodone, sumatriptan, trazodone and historically she has had a fair mixed response to these medications; notably she had not had a prolonged period of sobriety while under treatment. They are prescribed bothy by her outpt psych NP [name]) and PCP.  She was compliant with medications prior to admission as evidenced by chart review, although there is some concern for abuse of klonopin in ED notes; Klonopin has been successfully tapered this admission. On initial examination, patient was too somnolent for full exam - remained somnolent on re-eval 8/24 but was at least more interactive. Pt on subsequent exams has been pleasant and  cooperative. She answers all questions to the best of her ability.   Please see plan below for detailed recommendations. Today, 9/2, we are rescinding recommendation for voluntary psychiatric admission. She has been without suicidal ideation or extreme features of depression or mania throughout her hospitalization here; potential benefit of inpt admission outweighed by pt's very real needs for physical rehab and abiltiy to safely f/u outpt or in IOP for mental health needs superceding this. Focus for next few days should be on physical rehab vs SNF to drug rehab.   Diagnoses:  Active Hospital problems: Principal Problem:   Rhabdomyolysis Active Problems:   AMS (altered mental status)   Non-traumatic rhabdomyolysis   Encephalopathy, toxic   Polysubstance abuse (HCC)   Severe bipolar I disorder, most recent episode depressed (HCC)     Plan   ## Psychiatric Medication Recommendations:  -- Continue Cymbalta 120 mg (outpatient regimen) for depression, neuropathic pain -- Continue lumateperone tosylate (caplyta) for bipolar disorder -- Continue trazodone 25 mg for sleep -- Continue gabapentin at 300 mg TID for neuropathic pain, anxiety  ## Medical Decision Making Capacity:  Capacity was not formally addressed during this encounter; however, the patient gave clear responses to all questions and appeared to understand. Patient appears to have capacity.  ## Further Work-up:  -- Per primary  -- most recent EKG on 8/28 had QtC of 471.  -- Pertinent labwork reviewed earlier this admission includes: UDS (+amphetamines, benzos, opiates, cocaine, THC), elevated lactic acid, urinalysis (protein, bilirubin), elevated CK (Trend [phone]), elevated glucose Drug use: yes.   10/14/22 23:32  Amphetamines POSITIVE !  Barbiturates NONE DETECTED  Benzodiazepines POSITIVE !  Opiates POSITIVE !  COCAINE POSITIVE !  Tetrahydrocannabinol POSITIVE !   ## Disposition:  -- There are  no current  psychiatric contraindications to discharge at this time             -- d/w SW - next steps either HHPT with CD-IOP or SNF to drug rehab. Not good CIR candidate.   ## Behavioral / Environmental:  --   Patients with borderline personality traits/disorder often use the language of physical pain to communicate both physical and emotional suffering. It is important to address pain complaints as they arise and attempt to identify an etiology, either organic or psychiatric. In patients with chronic pain, it is important to have a discussion with the patient about expectations about pain control.  ## Safety and Observation Level:  - Based on my clinical evaluation, I estimate the patient to be at moderate risk of self harm in the current setting - At this time, we recommend a standard level of observation. This decision is based on my review of the chart including patient's history and current presentation, interview of the patient, mental status examination, and consideration of suicide risk including evaluating suicidal ideation, plan, intent, suicidal or self-harm behaviors, risk factors, and protective factors. This judgment is based on our ability to directly address suicide risk, implement suicide prevention strategies and develop a safety plan while the patient is in the clinical setting. Please contact our team if there is a concern that risk level has changed.  Suicide risk assessment  Patient has following modifiable risk factors for suicide: recklessness, which we are addressing by appropriate medication management for bipolar disorder; have d/c klonopin which decreases executive functioning.    Patient has following non-modifiable or demographic risk factors for suicide: history of suicide attempt, history of self harm behavior, and psychiatric hospitalization  Patient has the following protective factors against suicide: Supportive family, Supportive friends, and Cultural, spiritual, or  religious beliefs that discourage suicide  Thank you for this consult request. Recommendations have been communicated to the primary team.  We will follow at this time.   Claudius Mich A Elliemae Braman  Psychiatric and Social History   Relevant Aspects of Hospital Course:  Admitted on 10/14/2022 for found down at home by boyfriend. The Patient's family found her on the floor around vomit and pills. She reported been down for around 20 hours. She had bruising and wounds on her head and side. She was arousable however was noted to have episodes of apnea. Patient's family was concerned that she may be having withdrawal to fentanyl or benzos. Labs were obtained on presentation which were revealed WBC 24.4, hemoglobin 16.8, platelets 410, creatinine 1, CK 14,390, lactic acid 2.1, UDS positive for opioids, cocaine, benzos, amphetamines, THC. Chest x-ray showed no acute disease. X-ray of hip and ankle showed no evidence of fracture CT of spine showed no acute abnormalities patient was admitted for further workup. On admission she was difficult to arouse.    Progress Note: 09/2 Pt seen in hospital bed; consents to have friend Marchelle Folks remain in the room. Endorses ongoing episodic low mood and appetite, but has been sleeping fairly well for past several days, no impulsive/risky behaviors, and cravings have declined significantly. No SI this hospitalization. No side effects to meds. Pain about 5/10 after fall t his weekend, baseline around a 3.   Had long discussion of next steps with pt and friend at bedside - . Neither pt nor friend had any safety concerns (beyond fear of relapse). Briefly again discussed suboxone, pt declined for now as many rehabs will not allow. Deferred most ?s about insurance to  SW although did let them know that there are no real publicly funded 60-90 day programs - pt's mom apparently willing to pay out of pocket if pt agrees to engage in tx  Permission to call bf but not mother or sister. Please  do not call her mother or sister.  Psychiatric ROS 08/29 Mood Symptoms Ongoing mild depressive sx.   Manic Symptoms None recently  Psychosis Symptoms Denies.  Collateral information:  Friend amanda in room  Psychiatric History:  Information collected from chart review  Prev Dx/Sx: bipolar 1 disorder, borderline personality disorder, polysubstance use disorder, social anxiety disorder, anxiety, depression, chronic pain, seizures Current Psych Provider: Shanna Cisco, NP (Nurse Practitioner)  Current Meds: Cymbalta 120 mg daily, Caplyta 42 mg daily, Haldol 2 mg nightly, and trazodone 100 mg nightly as needed.  Patient is also prescribed Ambien 10 mg nightly,  gabapentin 400 mg 4 times daily, and Klonopin 1 mg 4 times daily  Previous Med Trials: Long medical history, most psychotropics have been attempted Therapy: Yes  Prior ECT: None Prior Psych Hospitalization: [facility]  05/07/2022, [facility]  03/14/2021, [facility] 2019, [facility] 2009 Prior Self Harm: Past suicidal attempts: 2023, 2021, past self harm, Prior Violence: yes  Family Psych History:Depression in her paternal grandmother;  Family Hx suicide: None found on chart review  Social History:  Access to weapons: Denies access  Tobacco use: yes, 1 ppd Alcohol use: No  8/23: BF in the room reports that she has been using fentanyl recreationally as well as takes a large amount of Klonopin which is prescribed to her, however she has been out of fentanyl for 2 days and not been taking her benzos, concern for acute opioid as well as benzodiazepine withdrawal.   Exam Findings  Psychiatric Specialty Exam:  Presentation  General Appearance: Casual; Fairly Groomed (multiple tattoos) Eye Contact:Good Speech:Clear and Coherent; Normal Rate Speech Volume:Normal Handedness:Right  Mood and Affect  Mood:Anxious (Frustrated with navigating insurance coverage) Affect:Appropriate;  Congruent  Thought Process  Thought Processes:Coherent; Goal Directed Descriptions of Associations:Intact Orientation:Full (Time, Place and Person) Thought Content:Logical History of Schizophrenia/Schizoaffective disorder:No data recorded Duration of Psychotic Symptoms:No data recorded Hallucinations:Hallucinations: None  Ideas of Reference:None Suicidal Thoughts:Suicidal Thoughts: No  Homicidal Thoughts:Homicidal Thoughts: No   Sensorium  Memory:Immediate Good; Recent Good; Remote Good Judgment:Fair Insight:Fair  Executive Functions  Concentration:Good Attention Span:Good Recall:Good Fund of Knowledge:Good Language:Good  Psychomotor Activity  Psychomotor Activity:Psychomotor Activity: Normal   Assets  Assets:Communication Skills; Intimacy  Sleep  Sleep:Sleep: Fair   Physical Exam: Vital signs:  Temp:  [98 F (36.7 C)-98.7 F (37.1 C)] 98.5 F (36.9 C) (09/02 0827) Pulse Rate:  [86-98] 88 (09/02 0827) Resp:  [17-19] 18 (09/02 0827) BP: (101-129)/(67-90) 129/85 (09/02 0827) SpO2:  [100 %] 100 % (09/02 0827)  Physical Exam Vitals and nursing note reviewed.  Constitutional:      Appearance: She is ill-appearing.  HENT:     Head: Normocephalic.  Pulmonary:     Effort: Pulmonary effort is normal.  Musculoskeletal:        General: Signs of injury present.  Skin:    General: Skin is dry.     Findings: Bruising present.  Neurological:     Mental Status: She is alert and oriented to person, place, and time.  Psychiatric:        Behavior: Behavior normal.        Judgment: Judgment normal.     Blood pressure 129/85, pulse 88, temperature 98.5  F (36.9 C), temperature source Oral, resp. rate 18, height 5\' 11"  (1.803 m), weight 79.3 kg, SpO2 100%. Body mass index is 24.38 kg/m.   Other History   These have been pulled in through the EMR, reviewed, and updated if appropriate.   Family History:   The patient's family history includes Arthritis in her  maternal grandfather and maternal grandmother; Asthma in an other family member; COPD in an other family member; Cancer in an other family member; Depression in her paternal grandmother; Healthy in her father and mother; Hyperlipidemia in an other family member; Hypertension in an other family member; Stroke in an other family member.  Medical History: Past Medical History:  Diagnosis Date   Allergic rhinitis    Anxiety    Arthritis    Asthma    Bipolar disorder (HCC)    Borderline personality disorder (HCC)    Bupropion overdose    Chronic pain syndrome    Depression    Family history of adverse reaction to anesthesia    Mom - N/V   Fibromyalgia    generalized   Hallucinations    02/08/22- have cleared mom said.   Migraines    Nasal polyps    OCD (obsessive compulsive disorder)    Ovarian cyst    left   Pneumonia    2021   PONV (postoperative nausea and vomiting)    Schizophrenia (HCC)    schizoaffective   Seizures (HCC)    patient attenped sudide by tak alot of her medication,.    Surgical History: Past Surgical History:  Procedure Laterality Date   ABDOMINAL HYSTERECTOMY     LAPAROSCOPIC TUBAL LIGATION Bilateral 06/13/2019   Procedure: LAPAROSCOPIC TUBAL LIGATION;  Surgeon: Hermina Staggers, MD;  Location: [facility];  Service: Gynecology;  Laterality: Bilateral;  FILSHIE CLIPS   NASAL SINUS SURGERY     ORIF ANKLE FRACTURE Right 02/09/2022   Procedure: OPEN TREATMENT OF RIGHT BIMALLEOLAR ANKLE FRACTURE;  Surgeon: Terance Hart, MD;  Location[facility] OR;  Service: Orthopedics;  Laterality: Right;   TUBAL LIGATION     tubes in ears     VAGINAL HYSTERECTOMY Bilateral 01/15/2020   Procedure: HYSTERECTOMY VAGINAL;  Surgeon: Hermina Staggers, MD;  Location: [facility] OR;  Service: Gynecology;  Laterality: Bilateral;   WISDOM TOOTH EXTRACTION      Medications:   Current Facility-Administered Medications:    acetaminophen (TYLENOL) tablet 650 mg, 650 mg,  Oral, Q6H PRN, 650 mg at 10/20/22 1730 **OR** acetaminophen (TYLENOL) suppository 650 mg, 650 mg, Rectal, Q6H PRN, Dorrell, Robert, MD   alum & mag hydroxide-simeth (MAALOX/MYLANTA) 200-200-20 MG/5ML suspension 30 mL, 30 mL, Oral, Q4H PRN, Azucena Fallen, MD, 30 mL at 10/20/22 0045   doxycycline (VIBRA-TABS) tablet 100 mg, 100 mg, Oral, Q12H, Amin, Ankit C, MD, 100 mg at 10/25/22 0900   DULoxetine (CYMBALTA) DR capsule 120 mg, 120 mg, Oral, Daily, Wise, Byrd Hesselbach, MD, 120 mg at 10/25/22 0900   feeding supplement (ENSURE ENLIVE / ENSURE PLUS) liquid 237 mL, 237 mL, Oral, BID BM, Margaretmary Dys, MD, 237 mL at 10/23/22 0859   gabapentin (NEURONTIN) capsule 300 mg, 300 mg, Oral, TID, Margaretmary Dys, MD, 300 mg at 10/25/22 0901   guaiFENesin (ROBITUSSIN) 100 MG/5ML liquid 5 mL, 5 mL, Oral, Q4H PRN, Amin, Ankit C, MD   hydrALAZINE (APRESOLINE) injection 10 mg, 10 mg, Intravenous, Q4H PRN, Amin, Ankit C, MD   ibuprofen (ADVIL) tablet 600 mg, 600  mg, Oral, Q4H PRN, Azucena Fallen, MD, 600 mg at 10/23/22 1338   ipratropium-albuterol (DUONEB) 0.5-2.5 (3) MG/3ML nebulizer solution 3 mL, 3 mL, Nebulization, Q4H PRN, Amin, Ankit C, MD   lumateperone tosylate (CAPLYTA) capsule 42 mg, 42 mg, Oral, Daily, Sacoya Mcgourty A, 42 mg at 10/25/22 0901   metoprolol tartrate (LOPRESSOR) injection 5 mg, 5 mg, Intravenous, Q4H PRN, Amin, Ankit C, MD   mupirocin ointment (BACTROBAN) 2 %, , Nasal, BID, Amin, Ankit C, MD, Given at 10/25/22 [phone]   nicotine (NICODERM CQ - dosed in mg/24 hours) patch 21 mg, 21 mg, Transdermal, Daily, Samtani, Jai-Gurmukh, MD, 21 mg at 10/25/22 0906   ondansetron (ZOFRAN) tablet 4 mg, 4 mg, Oral, Q6H PRN, 4 mg at 10/18/22 0936 **OR** ondansetron (ZOFRAN) injection 4 mg, 4 mg, Intravenous, Q6H PRN, Dorrell, Robert, MD, 4 mg at 10/18/22 2106   phenol (CHLORASEPTIC) mouth spray 1 spray, 1 spray, Mouth/Throat, PRN, Azucena Fallen,  MD   potassium chloride SA (KLOR-CON M) CR tablet 80 mEq, 80 mEq, Oral, BID, Samtani, Jai-Gurmukh, MD, 80 mEq at 10/25/22 0900   senna-docusate (Senokot-S) tablet 1 tablet, 1 tablet, Oral, QHS PRN, Amin, Ankit C, MD   sodium chloride flush (NS) 0.9 % injection 10-40 mL, 10-40 mL, Intracatheter, Q12H, Azucena Fallen, MD, 10 mL at 10/25/22 0902   sodium chloride flush (NS) 0.9 % injection 10-40 mL, 10-40 mL, Intracatheter, PRN, Azucena Fallen, MD   traMADol Janean Sark) tablet 50 mg, 50 mg, Oral, Q12H PRN, Rhetta Mura, MD, 50 mg at 10/25/22 0906   traZODone (DESYREL) tablet 25 mg, 25 mg, Oral, QHS PRN, Margaretmary Dys, MD, 25 mg at 10/24/22 2054  Allergies: Allergies  Allergen Reactions   Keflex [Cephalexin] Anaphylaxis    Has tolerated amoxicillin 2019/2020

## 2022-10-25 NOTE — Discharge Instructions (Signed)
Please follow up for outpt mental health needs with Sherril Cong, NP  To enroll in [facility] CD-IOP program (this is the virtual support group for more independent care) please call Myrna Blazer, LCSW at [phone] - they really want self-motivated patients to do the referral themselves. This would be a good option for you if you end up going home with physical therapy.  As always, if you have suicidal thoughts or cannot keep yourself safe please call [phone] or go to the nearest ED.

## 2022-10-25 NOTE — TOC Initial Note (Signed)
Transition of Care [facility]) - Initial/Assessment Note    Patient Details  Name: Carmen Daniels MRN: [id number] Date of Birth: 02/01/1986  Transition of Care [facility]) CM/SW Contact:    Lorri Frederick, LCSW Phone Number: 10/25/2022, 2:37 PM  Clinical Narrative:   CSW met with pt regarding DC plan.  Pt requests that CSW include friend Charolett Bumpers by phone, which was done.  Permission given to speak with Marchelle Folks on speakerphone.  Discussed updates: pt has been cleared by psych team, no longer attempting to locate inpt psych treatment.  CIR unable to accept pt, Marchelle Folks asking for clarification as to why she cannot go to CIR Megan Salon informed later)  Discussed pt trillium medicaid does not pay for SNF.  Discussed home options, pt has been staying in a separate residence owned by her parents but states she is not allowed to return there without completing drug treatment.  Pt is willing to be admitted to residential drug treatment, pt mobility is a barrier here but CSW will see if she could be admitted with wheelchair mobility.  Pt mother is in [place] and difficult to reach.  Marchelle Folks reports that she is not an option for pt to DC to her home.  CSW discussed that pt is stable for DC so there is some time urgency to come up with a workable DC plan by end of day tomorrow.  Some pushback from Marchelle Folks that pt was cleared by psych "so they could put you out."  General agreement that pt does not have housing options and her mother is most able to assist.  Marchelle Folks will attempt to reach out to pt's mother to see if she could assist.                   Barriers to Discharge: Other (must enter comment) (SNF with no payer)   Patient Goals and CMS Choice            Expected Discharge Plan and Services In-house Referral: Clinical Social Work   Post Acute Care Choice: IP Rehab Living arrangements for the past 2 months: Single Family Home Expected Discharge Date: 10/22/22                                     Prior Living Arrangements/Services Living arrangements for the past 2 months: Single Family Home Lives with:: Parents Patient language and need for interpreter reviewed:: Yes Do you feel safe going back to the place where you live?: No   too many stairs  Need for Family Participation in Patient Care: No (Comment) Care giver support system in place?: Yes (comment)   Criminal Activity/Legal Involvement Pertinent to Current Situation/Hospitalization: No - Comment as needed  Activities of Daily Living Home Assistive Devices/Equipment: Cane (specify quad or straight), Walker (specify type), Built-in shower seat, Grab bars around toilet, Wheelchair ADL Screening (condition at time of admission) Patient's cognitive ability adequate to safely complete daily activities?: Yes Is the patient deaf or have difficulty hearing?: No Does the patient have difficulty seeing, even when wearing glasses/contacts?: No Does the patient have difficulty concentrating, remembering, or making decisions?: Yes (history of TBI) Patient able to express need for assistance with ADLs?: Yes Does the patient have difficulty dressing or bathing?: No Independently performs ADLs?: Yes (appropriate for developmental age) Does the patient have difficulty walking or climbing stairs?: Yes Weakness of Legs: Right Weakness of Arms/Hands: None  Permission Sought/Granted Permission sought to share information with : Family Supports Permission granted to share information with : Yes, Verbal Permission Granted  Share Information with NAME: friend amanda           Emotional Assessment Appearance:: Appears stated age Attitude/Demeanor/Rapport: Engaged Affect (typically observed): Appropriate, Pleasant Orientation: : Oriented to Self, Oriented to Place, Oriented to  Time, Oriented to Situation      Admission diagnosis:  Rhabdomyolysis [M62.82] Patient Active Problem List   Diagnosis Date Noted   Severe bipolar I  disorder, most recent episode depressed (HCC) 10/22/2022   Polysubstance abuse (HCC) 10/20/2022   Rhabdomyolysis 10/15/2022   Encephalopathy, toxic 10/15/2022   Intentional overdose (HCC) 02/07/2021   Weakness 08/19/2020   Gait abnormality 08/19/2020   Neuropathic pain 08/19/2020   Malnutrition of moderate degree 07/01/2020   Acute renal failure (HCC) 06/30/2020   Non-traumatic rhabdomyolysis    Elevated LFTs    Tardive dyskinesia 11/14/2019   Social anxiety disorder 09/13/2019   Adjustment disorder with mixed anxiety and depressed mood 07/31/2019   Suicidal ideation    Post-operative state 07/19/2019   Seizures (HCC) 06/08/2019   Status epilepticus (HCC) 05/17/2019   Intentional benzodiazepine overdose (HCC) 03/29/2019   Intractable chronic 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 status migrainosus 02/08/2019   History of elective abortion 12/28/2018   QT prolongation 07/03/2018   AMS (altered mental status) 07/03/2018   Serum total bilirubin elevated 07/03/2018   Syncope 07/02/2018   Borderline personality disorder (HCC) 04/11/2017   Abnormal MRI of head 04/21/2016   Chronic migraine without aura 03/27/2015   Mild persistent asthma 12/30/2014   Allergic rhinitis due to pollen 12/30/2014   Rhinitis medicamentosa 12/30/2014   Nasal polyposis 12/30/2014   PCP:  Salli Real, MD Pharmacy:   [facility] - [place], [place] - [address] [place] [address] Phone: [phone] Fax: [phone]     Social Determinants of Health (SDOH) Social History: SDOH Screenings   Food Insecurity: No Food Insecurity (10/15/2022)  Housing: Low Risk  (10/15/2022)  Transportation Needs: No Transportation Needs (10/15/2022)  Utilities: Not At Risk (10/15/2022)  Alcohol Screen: Low Risk  (04/10/2017)  Depression (PHQ2-9): High Risk (08/04/2022)  Financial Resource Strain: Medium Risk (05/07/2022)   Received from [facility]  Social Connections:  Unknown (06/24/2021)   Received from [facility]  Stress: Stress Concern Present (05/07/2022)   Received from [facility], [facility]  Tobacco Use: High Risk (10/15/2022)   SDOH Interventions:     Readmission Risk Interventions     No data to display

## 2022-10-26 DIAGNOSIS — F3189 Other bipolar disorder: Secondary | ICD-10-CM | POA: Diagnosis not present

## 2022-10-26 DIAGNOSIS — M6282 Rhabdomyolysis: Secondary | ICD-10-CM | POA: Diagnosis not present

## 2022-10-26 DIAGNOSIS — F1910 Other psychoactive substance abuse, uncomplicated: Secondary | ICD-10-CM | POA: Diagnosis not present

## 2022-10-26 LAB — CBC WITH DIFFERENTIAL/PLATELET
Abs Immature Granulocytes: 0.04 10*3/uL (ref 0.00–0.07)
Basophils Absolute: 0.1 10*3/uL (ref 0.0–0.1)
Basophils Relative: 1 %
Eosinophils Absolute: 0 10*3/uL (ref 0.0–0.5)
Eosinophils Relative: 0 %
HCT: 40.7 % (ref 36.0–46.0)
Hemoglobin: 14 g/dL (ref 12.0–15.0)
Immature Granulocytes: 0 %
Lymphocytes Relative: 17 %
Lymphs Abs: 1.6 10*3/uL (ref 0.7–4.0)
MCH: 29.5 pg (ref 26.0–34.0)
MCHC: 34.4 g/dL (ref 30.0–36.0)
MCV: 85.9 fL (ref 80.0–100.0)
Monocytes Absolute: 0.6 10*3/uL (ref 0.1–1.0)
Monocytes Relative: 6 %
Neutro Abs: 7.3 10*3/uL (ref 1.7–7.7)
Neutrophils Relative %: 76 %
Platelets: 404 10*3/uL — ABNORMAL HIGH (ref 150–400)
RBC: 4.74 MIL/uL (ref 3.87–5.11)
RDW: 13.5 % (ref 11.5–15.5)
WBC: 9.6 10*3/uL (ref 4.0–10.5)
nRBC: 0 % (ref 0.0–0.2)

## 2022-10-26 LAB — BASIC METABOLIC PANEL
Anion gap: 10 (ref 5–15)
BUN: 5 mg/dL — ABNORMAL LOW (ref 6–20)
CO2: 24 mmol/L (ref 22–32)
Calcium: 9.3 mg/dL (ref 8.9–10.3)
Chloride: 101 mmol/L (ref 98–111)
Creatinine, Ser: 0.62 mg/dL (ref 0.44–1.00)
GFR, Estimated: >60 mL/min (ref >60)
Glucose, Bld: 113 mg/dL — ABNORMAL HIGH (ref 70–99)
Potassium: 3.9 mmol/L (ref 3.5–5.1)
Sodium: 135 mmol/L (ref 135–145)

## 2022-10-26 MED ORDER — POTASSIUM CHLORIDE CRYS ER 20 MEQ PO TBCR
40.0000 meq | EXTENDED_RELEASE_TABLET | Freq: Every day | ORAL | Status: DC
  Administered 2022-10-27 – 2022-11-11 (×16): 40 meq via ORAL
  Filled 2022-10-26 (×16): qty 2

## 2022-10-26 NOTE — Progress Notes (Signed)
Inpatient Rehab Admissions Coordinator:    CIR asked to re-screen for possible CIR admit. At this time, PT Is supervision with transfers, with OT recommending HH vs no follow up, and has no speech needs. All CIR candidates must demonstrate need for intensive rehab in 2 or more disciplines. Pt. Does not demonstrate functional deficits necessitating inpatient rehabilitation and is not a CIR candidate. Additionally, per TOC, Pt. Cannot d/c home with her mother without completing drug rehab and CIR cannot accept patients who do not have a disposition plan (CIR is typically unable to get patients accepted directly into drug rehab programs, so home discharge plan is required). I will not pursue admit for this Pt.  Megan Salon, MS, CCC-SLP Rehab Admissions Coordinator  [phone] (celll) [phone] (office)

## 2022-10-26 NOTE — Progress Notes (Signed)
Occupational Therapy Treatment Patient Details Name: Carmen Daniels MRN: [id number] DOB: 06-01-1985 Today's Date: 10/26/2022   History of present illness Pt is 37 yo presenting to [facility] ED 8/22 after being found down by family near vomit and multiple medications. Pt was found to have acute toxic metabolic encephalopathy and severe rhabdomyolysis. PMH: bipolar, substance abuse disorder, fibromyalgia, schizoaffective disorder, BPD, asthma, OCD, prior overdose and questionable suicidal ideation at outside facility.   OT comments  Patient seen with PT to address self care, functional transfers, and wheelchair mobility. Patient able to perform dressing including donning CAM boot, pants, shoe, and T-shirt seated on EOB with lateral leaning to pull up clothing. Patient states she does not believe she can perform bathing at sink due to difficulty standing and dizziness when OOB. Patient able to perform wheelchair mobility in hallway with supervision and provided education on putting on/off leg rest. Patient to continue to be followed by acute OT to increase independence with self care.       If plan is discharge home, recommend the following:  A little help with walking and/or transfers;Assistance with cooking/housework;Direct supervision/assist for medications management;Direct supervision/assist for financial management;Help with stairs or ramp for entrance;Assist for transportation   Equipment Recommendations  None recommended by OT    Recommendations for Other Services      Precautions / Restrictions Precautions Precautions: Fall Precaution Comments: R drop foot Restrictions Weight Bearing Restrictions: No       Mobility Bed Mobility Overal bed mobility: Independent                  Transfers Overall transfer level: Needs assistance Equipment used: Rolling walker (2 wheels) Transfers: Sit to/from Stand, Bed to chair/wheelchair/BSC Sit to Stand: Contact guard assist Stand pivot  transfers: Contact guard assist         General transfer comment: Pt able to transfer to and from [facility] woth CGA for safety. Pt continues to report dizziness.     Balance Overall balance assessment: Needs assistance Sitting-balance support: No upper extremity supported, Feet supported Sitting balance-Leahy Scale: Good Sitting balance - Comments: able to perform dressing seated on EOB   Standing balance support: Bilateral upper extremity supported, During functional activity Standing balance-Leahy Scale: Fair Standing balance comment: reliant on UE support                           ADL either performed or assessed with clinical judgement   ADL Overall ADL's : Needs assistance/impaired     Grooming: Brushing hair;Sitting;Set up Grooming Details (indicate cue type and reason): on EOB         Upper Body Dressing : Set up;Sitting Upper Body Dressing Details (indicate cue type and reason): donned T-shirt Lower Body Dressing: Sitting/lateral leans;Set up Lower Body Dressing Details (indicate cue type and reason): able to donn CAM boot, pants and shoes on EOB with lateral leaning to pull up clothing Toilet Transfer: Supervision/safety;Rolling walker (2 wheels) Toilet Transfer Details (indicate cue type and reason): simulated           General ADL Comments: patient states she does not believe she can perform bathing at sink due to difficulty standing and dizziness    Extremity/Trunk Assessment              Vision       Perception     Praxis      Cognition Arousal: Alert Behavior During Therapy: [facility] for tasks assessed/performed Overall  Cognitive Status: Within Functional Limits for tasks assessed                                 General Comments: able to follow directions, demonstrates flat affect        Exercises      Shoulder Instructions       General Comments VSS    Pertinent Vitals/ Pain       Pain Assessment Pain  Assessment: Faces Faces Pain Scale: Hurts little more Pain Location: R foot Pain Descriptors / Indicators: Burning, Discomfort, Grimacing Pain Intervention(s): Limited activity within patient's tolerance, Monitored during session, Premedicated before session  Home Living                                          Prior Functioning/Environment              Frequency  Min 1X/week        Progress Toward Goals  OT Goals(current goals can now be found in the care plan section)  Progress towards OT goals: Progressing toward goals  Acute Rehab OT Goals OT Goal Formulation: With patient Time For Goal Achievement: 11/04/22 Potential to Achieve Goals: Good ADL Goals Pt Will Perform Lower Body Bathing: Independently Pt Will Perform Lower Body Dressing: Independently Pt Will Transfer to Toilet: Independently Pt Will Perform Toileting - Clothing Manipulation and hygiene: Independently  Plan      Co-evaluation    PT/OT/SLP Co-Evaluation/Treatment: Yes Reason for Co-Treatment: Complexity of the patient's impairments (multi-system involvement);For patient/therapist safety;Necessary to address cognition/behavior during functional activity PT goals addressed during session: Mobility/safety with mobility;Proper use of DME OT goals addressed during session: ADL's and self-care      AM-PAC OT "6 Clicks" Daily Activity     Outcome Measure   Help from another person eating meals?: None Help from another person taking care of personal grooming?: None Help from another person toileting, which includes using toliet, bedpan, or urinal?: A Little Help from another person bathing (including washing, rinsing, drying)?: A Little Help from another person to put on and taking off regular upper body clothing?: None Help from another person to put on and taking off regular lower body clothing?: A Little 6 Click Score: 21    End of Session Equipment Utilized During Treatment:  Rolling walker (2 wheels)  OT Visit Diagnosis: Unsteadiness on feet (R26.81);Muscle weakness (generalized) (M62.81)   Activity Tolerance Patient tolerated treatment well   Patient Left in bed;with call bell/phone within reach;with family/visitor present   Nurse Communication Mobility status        Time: 1610-9604 OT Time Calculation (min): 32 min  Charges: OT General Charges $OT Visit: 1 Visit OT Treatments $Self Care/Home Management : 8-22 mins  Alfonse Flavors, OTA Acute Rehabilitation Services  Office [phone]   Dewain Penning 10/26/2022, 2:34 PM

## 2022-10-26 NOTE — [place] FL2 (Signed)
[place] MEDICAID FL2 LEVEL OF CARE FORM     IDENTIFICATION  Patient Name: Carmen Daniels Birthdate: [birth year] Sex: female Admission Date (Current Location): 10/14/2022  [place] and [place] Number:  Haynes Bast [id number] Facility and Address:  [facility], [address] [place], [place] [address]      Provider Number: [id number]  Attending Physician Name and Address:  Rhetta Mura, MD  Relative Name and Phone Number:  Iran Planas [phone]    Current Level of Care: Hospital Recommended Level of Care: Skilled Nursing Facility Prior Approval Number:    Date Approved/Denied:   PASRR Number:    Discharge Plan: SNF    Current Diagnoses: Patient Active Problem List   Diagnosis Date Noted   Severe bipolar I disorder, most recent episode depressed (HCC) 10/22/2022   Polysubstance abuse (HCC) 10/20/2022   Rhabdomyolysis 10/15/2022   Encephalopathy, toxic 10/15/2022   Intentional overdose (HCC) 02/07/2021   Weakness 08/19/2020   Gait abnormality 08/19/2020   Neuropathic pain 08/19/2020   Malnutrition of moderate degree 07/01/2020   Acute renal failure (HCC) 06/30/2020   Non-traumatic rhabdomyolysis    Elevated LFTs    Tardive dyskinesia 11/14/2019   Social anxiety disorder 09/13/2019   Adjustment disorder with mixed anxiety and depressed mood 07/31/2019   Suicidal ideation    Post-operative state 07/19/2019   Seizures (HCC) 06/08/2019   Status epilepticus (HCC) 05/17/2019   Intentional benzodiazepine overdose (HCC) 03/29/2019   Intractable chronic 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 status migrainosus 02/08/2019   History of elective abortion 12/28/2018   QT prolongation 07/03/2018   AMS (altered mental status) 07/03/2018   Serum total bilirubin elevated 07/03/2018   Syncope 07/02/2018   Borderline personality disorder (HCC) 04/11/2017   Abnormal MRI of head 04/21/2016   Chronic migraine without aura 03/27/2015   Mild  persistent asthma 12/30/2014   Allergic rhinitis due to pollen 12/30/2014   Rhinitis medicamentosa 12/30/2014   Nasal polyposis 12/30/2014    Orientation RESPIRATION BLADDER Height & Weight     Self, Time, Situation, Place  Normal Continent Weight: 174 lb 12.8 oz (79.3 kg) Height:  5\' 11"  (180.3 cm)  BEHAVIORAL SYMPTOMS/MOOD NEUROLOGICAL BOWEL NUTRITION STATUS    Convulsions/Seizures Continent Diet (see discharge summary)  AMBULATORY STATUS COMMUNICATION OF NEEDS Skin   Limited Assist Verbally Skin abrasions                       Personal Care Assistance Level of Assistance  Bathing, Feeding, Dressing Bathing Assistance: Independent Feeding assistance: Independent Dressing Assistance: Independent     Functional Limitations Info  Sight, Hearing, Speech Sight Info: Adequate Hearing Info: Adequate Speech Info: Adequate    SPECIAL CARE FACTORS FREQUENCY  PT (By licensed PT), OT (By licensed OT)     PT Frequency: 5x week OT Frequency: 5x week            Contractures Contractures Info: Not present    Additional Factors Info  Code Status, Allergies Code Status Info: full Allergies Info: keflex           Current Medications (10/26/2022):  This is the current hospital active medication list Current Facility-Administered Medications  Medication Dose Route Frequency Provider Last Rate Last Admin   acetaminophen (TYLENOL) tablet 650 mg  650 mg Oral Q6H PRN Alan Mulder, MD   650 mg at 10/20/22 1730   Or   acetaminophen (TYLENOL) suppository 650 mg  650 mg Rectal Q6H PRN Alan Mulder, MD  alum & mag hydroxide-simeth (MAALOX/MYLANTA) 200-200-20 MG/5ML suspension 30 mL  30 mL Oral Q4H PRN Azucena Fallen, MD   30 mL at 10/20/22 0045   doxycycline (VIBRA-TABS) tablet 100 mg  100 mg Oral Q12H Amin, Ankit C, MD   100 mg at 10/26/22 0931   DULoxetine (CYMBALTA) DR capsule 120 mg  120 mg Oral Daily Margaretmary Dys, MD   120 mg at 10/26/22  0931   feeding supplement (ENSURE ENLIVE / ENSURE PLUS) liquid 237 mL  237 mL Oral BID BM Margaretmary Dys, MD   237 mL at 10/26/22 0935   gabapentin (NEURONTIN) capsule 300 mg  300 mg Oral TID Margaretmary Dys, MD   300 mg at 10/26/22 0933   guaiFENesin (ROBITUSSIN) 100 MG/5ML liquid 5 mL  5 mL Oral Q4H PRN Amin, Ankit C, MD       hydrALAZINE (APRESOLINE) injection 10 mg  10 mg Intravenous Q4H PRN Amin, Ankit C, MD       ibuprofen (ADVIL) tablet 600 mg  600 mg Oral Q4H PRN Azucena Fallen, MD   600 mg at 10/23/22 1338   ipratropium-albuterol (DUONEB) 0.5-2.5 (3) MG/3ML nebulizer solution 3 mL  3 mL Nebulization Q4H PRN Amin, Ankit C, MD       lumateperone tosylate (CAPLYTA) capsule 42 mg  42 mg Oral Daily Cinderella, Margaret A   42 mg at 10/26/22 0935   metoprolol tartrate (LOPRESSOR) injection 5 mg  5 mg Intravenous Q4H PRN Amin, Ankit C, MD       mupirocin ointment (BACTROBAN) 2 %   Nasal BID Miguel Rota, MD   Given at 10/26/22 1610   nicotine (NICODERM CQ - dosed in mg/24 hours) patch 21 mg  21 mg Transdermal Daily Rhetta Mura, MD   21 mg at 10/26/22 0930   ondansetron (ZOFRAN) tablet 4 mg  4 mg Oral Q6H PRN Alan Mulder, MD   4 mg at 10/18/22 0936   Or   ondansetron (ZOFRAN) injection 4 mg  4 mg Intravenous Q6H PRN Alan Mulder, MD   4 mg at 10/18/22 2106   phenol (CHLORASEPTIC) mouth spray 1 spray  1 spray Mouth/Throat PRN Azucena Fallen, MD       potassium chloride SA (KLOR-CON M) CR tablet 80 mEq  80 mEq Oral BID Rhetta Mura, MD   80 mEq at 10/26/22 0931   senna-docusate (Senokot-S) tablet 1 tablet  1 tablet Oral QHS PRN Amin, Ankit C, MD       sodium chloride flush (NS) 0.9 % injection 10-40 mL  10-40 mL Intracatheter Q12H Azucena Fallen, MD   10 mL at 10/25/22 2044   sodium chloride flush (NS) 0.9 % injection 10-40 mL  10-40 mL Intracatheter PRN Azucena Fallen, MD       traMADol Janean Sark) tablet 50 mg  50 mg  Oral Q12H PRN Rhetta Mura, MD   50 mg at 10/25/22 2043   traZODone (DESYREL) tablet 25 mg  25 mg Oral QHS PRN Margaretmary Dys, MD   25 mg at 10/25/22 2043     Discharge Medications: Please see discharge summary for a list of discharge medications.  Relevant Imaging Results:  Relevant Lab Results:   Additional Information SSN: [id number]  Lorri Frederick, LCSW

## 2022-10-26 NOTE — TOC Progression Note (Addendum)
Transition of Care [facility]) - Progression Note    Patient Details  Name: Carmen Daniels MRN: [id number] Date of Birth: 07-09-1985  Transition of Care [facility]) CM/SW Contact  Lorri Frederick, LCSW Phone Number: 10/26/2022, 8:28 AM  Clinical Narrative:   CSW spoke with admissions at [facility] residential substance abuse program.  They can accommodate pt in wheelchair as long as pt is independent with ADLs.  Admissions person at [facility] not yet in to answer same question for that residential treatment program.    [phone]: CSW spoke with pt and boyfriend Minerva Areola, permission given to speak with Minerva Areola present.  They ask why CSW "avoiding them."  Per pt, she has not spoken to her mother and never was intending to as her mother is in [place].  Minerva Areola asking why pt cannot attend CIR, another explanation given.  SNF discussed again, Minerva Areola reports family may be able to private pay for SNF.  They are requesting referral be sent out for SNF. CSW updates that [facility] reports they could accommodate pt with wheelchair as long as she is independent with ADS.  Minerva Areola asks CSW to call [facility].  They report Marchelle Folks will be here shortly and they would like to talk further after Marchelle Folks arrives.    1000: referral faxed out for SNF. CSW spoke to [facility]--pt needs to call admissions to discuss potential admission there.  1020: CSW and Dr Gasper Sells spoke with pt and Marchelle Folks in room.  Discussed substance abuse residential referrals, permission given to send those out to [facility] and [facility]. Discussed that pt will work with PT/OT today.  Will see if any SNF bed offers are made, discussed again that pt would likely need to private pay for those.  Marchelle Folks is planning to contact pt insurance to discuss this.  Discussed need for place for pt to DC to in the event these options are unsuccessful or if residential substance abuse does not have an immediate bed available.    1250: TC Michelle[facility].  She has received referral, she would like  updated PT/OT notes to confirm pt is independent with ADLs.  She will forward to RN for review.  Asked CSW to follow up with Donna[facility] moving forward on this referral: [phone], [phone].   Marchelle Folks  also came to CSW work station, asking questions about CSW providing copies of forms, CSW directed her to medical records.  Medicare choice document provided with responses of multiple SNFs since referral was sent out this AM.  No bed offers so far. Marchelle Folks also shared that she received message back from pt mother through "what's App" from [place] indicating she is not going to assist.    1500: [facility] referral form filled out and faxed along with updated PT/OT notes.  LM with Lupita Leash to confirm she received them.   1515: CSW spoke with admissions at [facility].  They did receive referral, they would like the updated PT/OT notes.  Pt needs to call them to complete phone screening as next step.  CSW informed pt, provided phone number.  1530: CSW asked by Marchelle Folks to speak with Arvil Persons, case manager for [facility], who assists [facility]ium pts.  She asked for clarification on DC plans, discussed SNF vs residential substance abuse treatment.  She can assist efforts to find SNF placement if sent FL2 and other referral info.  CSW confirmed with [facility] supervisor Jiles Crocker that this info can be released, info emailed to: [email or web address].    During this conversation pt and Marchelle Folks are stating that  PT/OT have told them today that she is not able to care for self, therefore plan needs to be SNF.      Barriers to Discharge: Other (must enter comment) (SNF with no payer)  Expected Discharge Plan and Services In-house Referral: Clinical Social Work   Post Acute Care Choice: IP Rehab Living arrangements for the past 2 months: Single Family Home Expected Discharge Date: 10/22/22                                     Social Determinants of Health (SDOH) Interventions SDOH Screenings   Food Insecurity:  No Food Insecurity (10/15/2022)  Housing: Low Risk  (10/15/2022)  Transportation Needs: No Transportation Needs (10/15/2022)  Utilities: Not At Risk (10/15/2022)  Alcohol Screen: Low Risk  (04/10/2017)  Depression (PHQ2-9): High Risk (08/04/2022)  Financial Resource Strain: Medium Risk (05/07/2022)   Received from [facility], [facility]  Social Connections: Unknown (06/24/2021)   Received from [facility], [facility]  Stress: Stress Concern Present (05/07/2022)   Received from [facility], [facility]  Tobacco Use: High Risk (10/15/2022)    Readmission Risk Interventions     No data to display

## 2022-10-26 NOTE — Plan of Care (Signed)

## 2022-10-26 NOTE — Progress Notes (Signed)
HOSPITALIST ROUNDING NOTE Carmen Daniels HQI:[id number]  DOB: Oct 24, 1985  DOA: 10/14/2022  PCP: Salli Real, MD  10/26/2022,3:46 PM   LOS: 11 days      Code Status: Presumed full   From: Home  current Dispo: Inpatient psychiatry     41 white female Prior transvaginal hysterectomy 2/2 DUB, neck back spasm previously treated with Botox, migraines, mild persistent asthma Previous bimalleolar fracture 01/2022 which was repaired by Dr. Susa Simmonds of orthopedics  Her psych diagnoses =bipolar personality disorder schizoaffective social anxiety disorder adjustment disorder NOS Prior admission 12/17 through 02/19/2021 acute toxic encephalopathy 2/2 Ambien Klonopin Imitrex + SSI a--> to behavioral health subsequently-that hospitalization acute hypercarbic respiratory failure drug-induced hypotension bradycardia complicated aspiration pneumonia Prior admission liver injury rhabdo 06/2020-creatinine peaked at 8-had bilateral neuropathy lower extremities additionally she was started on neuropathic medications for this issue and was noted to have foot drop previously with a compression nerve palsy Previous management at [facility] (on referral from [facility]) previously on Suboxone Klonopin gabapentin-was told at the time to continue Suboxone and would require comprehensive chronic pain rehab center and was referred to [facility] as well as psychology Last inpatient psych hospitalization apparently 05/07/2022-Rx Lumateperone, Klonopin Cymbalta Eskalith (lithium) Ambien  8/22 was found on the ground with vomit pills scattered on the floor bruising to right eye cold extremities slow to respond-admitted to fentanyl + Klonopin recreationally-thought to have opioid/BZD withdrawal WBC 24 creatinine 1.01 CK14 390 lactic acid 2.1 UDS pan positive amphetamine BZD opiate cocaine [facility] 8/24 psychiatry consulted patient admitting to be depressed and attempted SI 2/2 loss of best friend by overdose Dr. Susa Simmonds was  personally contacted by my partner Dr. Nelson Chimes and patient was recommended to use an AFO and get outpatient EMG studies  8/29 moderate left + right facet arthropathy at L4-L5 plus effusion fidamoxixin destruction, moderate facet arthropathy L5-S1 without any change from 2022   Plan  Toxic metabolic encephalopathy on arrival secondary to polydrug overdose IVC rescinded cleared by psychiatry and now needs to go to skilled facility See below  At high risk for withdrawal Avoiding sedating meds inclusive of benzos and opiates Patient completed phenobarb taper   Fall on 8/31 Not witnessed by nursing staff-ambulate expressly with nursing staff use walker--PT updated recommendation to skilled-no payer source-see below  Right lower extremity palsy, right lower extremity erythema MRI lumbar spine as above probably contributing to foot drop Dr. Susa Simmonds Ortho contacted by Dr. Chrisandra Netters brace avoid prolonged dorsiflexion and chronic contractures-outpatient therapy initiated Pain control ibuprofen 600 every 4 as needed headache cramping, tramadol 50 every 12 as needed severe pain --gabapentin 300 3 times daily neuropathy  Cellulitis of various lesions on knee, ankle Erythema felt to be secondary to superficial infection-Rx mupirocin, doxycycline and stop date 10/28/2022 STOP LABS   Hypokalemia Resolved-low-dose potassium and recheck K/mag at facility in 3 days  Lactic acidosis on arrival-noninfectious Resolved  Mild transaminitis during hospitalization 2/2 rhabdo, no further workup currently-outpatient reeval once stabilized  Diarrhea during hospitalization With electrolyte disturbances Resolved, stop imodium  Multiple psych diagnoses As per psychiatry will continue Cymbalta 120 daily, Caplyta 42 daily, trazodone 25 at bedtime as needed sleep   DVT prophylaxis: SCD  Status is: Inpatient Remains inpatient appropriate because:   Has no payer source and unfortunately cannot go to skilled  immediately-I had a long discussion with boyfriend Minerva Areola at the bedside who says that he cannot take care of her at home with the brace and he is not set up  for this Social work has been diligently working towards getting a plan with regards to planning for discharge PATIENT MEDICALLY STABLE FOR DISCHARGE SINCE 10/22/2022  Subjective:  Fielded multiple concerns from boyfriend primarily related to disposition Medically patient is stable  Objective + exam Vitals:   10/25/22 2010 10/26/22 0417 10/26/22 0813 10/26/22 1446  BP: 137/84 131/77 127/70 123/76  Pulse: 87 87 87 98  Resp: 17 17 16 18   Temp: 98.8 F (37.1 C) 98.4 F (36.9 C) 98.9 F (37.2 C) 98.5 F (36.9 C)  TempSrc:   Oral Oral  SpO2: 100% 100% 100% 100%  Weight:      Height:       Filed Weights   10/15/22 0223  Weight: 79.3 kg    Examination:  Anxious coherent white female no distress S1-S2 tachycardic Abdomen soft Wounds on left knee in various stages of healing AFO on right leg  Data Reviewed: reviewed   CBC    Component Value Date/Time   WBC 9.6 10/26/2022 0936   RBC 4.74 10/26/2022 0936   HGB 14.0 10/26/2022 0936   HCT 40.7 10/26/2022 0936   PLT 404 (H) 10/26/2022 0936   MCV 85.9 10/26/2022 0936   MCH 29.5 10/26/2022 0936   MCHC 34.4 10/26/2022 0936   RDW 13.5 10/26/2022 0936   LYMPHSABS 1.6 10/26/2022 0936   MONOABS 0.6 10/26/2022 0936   EOSABS 0.0 10/26/2022 0936   BASOSABS 0.1 10/26/2022 0936      Latest Ref Rng & Units 10/26/2022    9:36 AM 10/24/2022    6:07 AM 10/23/2022    3:03 AM  CMP  Glucose 70 - 99 mg/dL 161  096    045  93   BUN 6 - 20 mg/dL 5  8    8  6    Creatinine 0.44 - 1.00 mg/dL 4.09  8.11    9.14  7.82   Sodium 135 - 145 mmol/L 135  135    136  135   Potassium 3.5 - 5.1 mmol/L 3.9  2.9    3.1  3.2   Chloride 98 - 111 mmol/L 101  101    101  99   CO2 22 - 32 mmol/L 24  25    24  24    Calcium 8.9 - 10.3 mg/dL 9.3  8.6    8.6  8.7   Total Protein 6.5 - 8.1 g/dL  6.3     Total Bilirubin 0.3 - 1.2 mg/dL  1.1    Alkaline Phos 38 - 126 U/L  62    AST 15 - 41 U/L  23    ALT 0 - 44 U/L  35       Scheduled Meds:  doxycycline  100 mg Oral Q12H   DULoxetine  120 mg Oral Daily   feeding supplement  237 mL Oral BID BM   gabapentin  300 mg Oral TID   lumateperone tosylate  42 mg Oral Daily   mupirocin ointment   Nasal BID   nicotine  21 mg Transdermal Daily   potassium chloride  80 mEq Oral BID   sodium chloride flush  10-40 mL Intracatheter Q12H   Continuous Infusions:  Time 60 minutes  Rhetta Mura, MD  [facility]

## 2022-10-26 NOTE — Progress Notes (Signed)
Physical Therapy Treatment Patient Details Name: Carmen Daniels MRN: [id number] DOB: [birth year] Today's Date: 10/26/2022   History of Present Illness Pt is 37 yo presenting to [facility] ED 8/22 after being found down by family near vomit and multiple medications. Pt was found to have acute toxic metabolic encephalopathy and severe rhabdomyolysis. PMH: bipolar, substance abuse disorder, fibromyalgia, schizoaffective disorder, BPD, asthma, OCD, prior overdose and questionable suicidal ideation at outside facility.    PT Comments  Pt tolerated treatment well today. Co-treat with OT. Session today focused on assessing pt ability to independently navigate using a WC. Pt did well with WC navigation however does require set up/supervision managing WC parts. Pt also continues to be limited by dizziness however VSS. No change in DC/DME recs at this time. PT will continue to follow.    If plan is discharge home, recommend the following: Help with stairs or ramp for entrance;A little help with walking and/or transfers;Assistance with cooking/housework;Assist for transportation   Can travel by private vehicle        Equipment Recommendations  None recommended by PT (Friend states today that she can grab needed DME from house.)    Recommendations for Other Services       Precautions / Restrictions Precautions Precautions: Fall Precaution Comments: R drop foot Restrictions Weight Bearing Restrictions: No     Mobility  Bed Mobility Overal bed mobility: Independent                  Transfers Overall transfer level: Needs assistance Equipment used: Rolling walker (2 wheels) Transfers: Sit to/from Stand, Bed to chair/wheelchair/BSC Sit to Stand: Contact guard assist Stand pivot transfers: Contact guard assist         General transfer comment: Pt able to transfer to and from [facility] woth CGA for safety. Pt continues to report dizziness.    Ambulation/Gait                   Investment banker, corporate mobility: Yes Wheelchair propulsion: Both upper extremities Wheelchair parts: Supervision/cueing Distance: 100 Wheelchair Assistance Details (indicate cue type and reason): Cues for locking brakes and how to navigate leg rests. Distance limited due to dizziness.   Tilt Bed    Modified Rankin (Stroke Patients Only)       Balance Overall balance assessment: Needs assistance Sitting-balance support: No upper extremity supported, Feet supported Sitting balance-Leahy Scale: Good     Standing balance support: Bilateral upper extremity supported, During functional activity Standing balance-Leahy Scale: Fair Standing balance comment: Pt requires UE support for functional mobility due to R foot deficits                            Cognition Arousal: Alert Behavior During Therapy: WFL for tasks assessed/performed Overall Cognitive Status: Within Functional Limits for tasks assessed                                 General Comments: pt with flat affect but able to express needs and answer questions appropriately        Exercises      General Comments General comments (skin integrity, edema, etc.): VSS      Pertinent Vitals/Pain Pain Assessment Pain Assessment: Faces Faces Pain Scale: Hurts little more Pain Location: R foot Pain Descriptors /  Indicators: Burning, Discomfort, Grimacing Pain Intervention(s): Monitored during session, Premedicated before session    Home Living                          Prior Function            PT Goals (current goals can now be found in the care plan section) Progress towards PT goals: Progressing toward goals    Frequency    Min 1X/week      PT Plan      Co-evaluation PT/OT/SLP Co-Evaluation/Treatment: Yes Reason for Co-Treatment: Complexity of the patient's impairments (multi-system involvement);For patient/therapist  safety;Necessary to address cognition/behavior during functional activity PT goals addressed during session: Mobility/safety with mobility;Proper use of DME OT goals addressed during session: ADL's and self-care      AM-PAC PT "6 Clicks" Mobility   Outcome Measure  Help needed turning from your back to your side while in a flat bed without using bedrails?: None Help needed moving from lying on your back to sitting on the side of a flat bed without using bedrails?: None Help needed moving to and from a bed to a chair (including a wheelchair)?: A Little Help needed standing up from a chair using your arms (e.g., wheelchair or bedside chair)?: A Little Help needed to walk in hospital room?: A Little Help needed climbing 3-5 steps with a railing? : A Lot 6 Click Score: 19    End of Session Equipment Utilized During Treatment: Gait belt;Other (comment) (CAM boot) Activity Tolerance: Patient tolerated treatment well Patient left: in bed;with call bell/phone within reach;with family/visitor present Nurse Communication: Mobility status PT Visit Diagnosis: Unsteadiness on feet (R26.81);Other abnormalities of gait and mobility (R26.89)     Time: 1610-9604 PT Time Calculation (min) (ACUTE ONLY): 32 min  Charges:    $Therapeutic Activity: 8-22 mins PT General Charges $$ ACUTE PT VISIT: 1 Visit                     Shela Nevin, PT, DPT Acute Rehab Services [phone]    Gladys Damme 10/26/2022, 1:06 PM

## 2022-10-26 NOTE — Consult Note (Signed)
Addendum: I did review EKG yesterday and there was a large improvement in qtc.   [name] Consult Evaluation  Service Date: October 26, 2022 LOS:  LOS: 11 days    Primary Psychiatric Diagnoses  Bipolar I Disorder 2.  Borderline Personality Disorder 3.  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 female admitted medically on 10/14/2022  8:08 PM for being found missing and unconscious surrounded by vomit and pills. She carries the psychiatric diagnoses of bipolar I disorder, borderline personality disorder, chronic pain syndrome, and has a past medical history of migraines, seasonal allergies, asthma, ovarian cysts, pneumonia, past intentional overdose, allergies, seizures . She reports current drug use. She reports that she does not drink alcohol. Psychiatry was consulted for concern for suicide attempt, medication management by Azucena Fallen, MD  on 10/15/2022.   Her initial presentation of altered mental status was most consistent with an unintentional drug overdose. She met criteria for drug overdose based on patient report, toxicology results. Current outpatient centrally acting medications include atogepant, beclomethasone, clonazepam, duloxetine, gabapentin, haloperidol, lumateperone tosylate, metoclopramide, oxycodone, sumatriptan, trazodone and historically she has had a fair mixed response to these medications; notably she had not had a prolonged period of sobriety while under treatment. They are prescribed bothy by her outpt psych NP [name]) and PCP.  She was compliant with medications prior to admission as evidenced by chart review, although there is some concern for abuse of klonopin in ED notes; Klonopin has been successfully tapered this admission. On initial examination, patient was too somnolent for full exam - remained somnolent on re-eval 8/24 but was at least more interactive. Pt on subsequent exams has been pleasant and  cooperative. She answers all questions to the best of her ability.   Please see plan below for detailed recommendations. Today, 9/3, we continue to recommend outpt or residential treatment as her physical sx allow. She has been without suicidal ideation or extreme features of depression or mania throughout her hospitalization here (generally has had moderate depression); potential benefit of inpt admission outweighed by pt's very real needs for physical rehab and abiltiy to safely f/u outpt or in IOP for mental health needs superceding this. Focus for next few days should be on physical rehab vs SNF to drug rehab.  Had discussion with pt, boyfirend, friend Marchelle Folks, and SW [place].   Diagnoses:  Active Hospital problems: Principal Problem:   Rhabdomyolysis Active Problems:   AMS (altered mental status)   Non-traumatic rhabdomyolysis   Encephalopathy, toxic   Polysubstance abuse (HCC)   Severe bipolar I disorder, most recent episode depressed (HCC)     Plan   ## Psychiatric Medication Recommendations:  -- Continue Cymbalta 120 mg (outpatient regimen) for depression, neuropathic pain -- Continue lumateperone tosylate (caplyta) for bipolar disorder -- Continue trazodone 25 mg for sleep -- Continue gabapentin at 300 mg TID for neuropathic pain, anxiety  ## Medical Decision Making Capacity:  Capacity was not formally addressed during this encounter  ## Further Work-up:  -- Per primary  -- most recent EKG on 8/28 had QtC of 471.  -- Pertinent labwork reviewed earlier this admission includes: UDS (+amphetamines, benzos, opiates, cocaine, THC), elevated lactic acid, urinalysis (protein, bilirubin), elevated CK (Trend [phone]), elevated glucose Drug use: yes.   10/14/22 23:32  Amphetamines POSITIVE !  Barbiturates NONE DETECTED  Benzodiazepines POSITIVE !  Opiates POSITIVE !  COCAINE POSITIVE !  Tetrahydrocannabinol POSITIVE !   ## Disposition:  --  There are no current  psychiatric contraindications to discharge at this time             -- d/w SW - next steps either HHPT or OPPT with CD-IOP or SNF --> drug rehab. Not good CIR candidate d/t lack of OT/speech needs.   ## Behavioral / Environmental:  --   Patients with borderline personality traits/disorder often use the language of physical pain to communicate both physical and emotional suffering. It is important to address pain complaints as they arise and attempt to identify an etiology, either organic or psychiatric. In patients with chronic pain, it is important to have a discussion with the patient about expectations about pain control.  ## Safety and Observation Level:  - Based on my clinical evaluation, I estimate the patient to be at low risk of self harm in the current setting   Suicide risk assessment  Patient has following modifiable risk factors for suicide: recklessness, which we are addressing by appropriate medication management for bipolar disorder; have d/c klonopin which decreases executive functioning.    Patient has following non-modifiable or demographic risk factors for suicide: history of suicide attempt, history of self harm behavior, and psychiatric hospitalization  Patient has the following protective factors against suicide: Supportive family, Supportive friends, and Cultural, spiritual, or religious beliefs that discourage suicide  Thank you for this consult request. Recommendations have been communicated to the primary team.  We will follow at this time.   Bryer Cozzolino A Tytus Strahle  Psychiatric and Social History   Relevant Aspects of Hospital Course:  Admitted on 10/14/2022 for found down at home by boyfriend. The Patient's family found her on the floor around vomit and pills. She reported been down for around 20 hours. She had bruising and wounds on her head and side. She was arousable however was noted to have episodes of apnea. Patient's family was concerned that she may be having  withdrawal to fentanyl or benzos. Labs were obtained on presentation which were revealed WBC 24.4, hemoglobin 16.8, platelets 410, creatinine 1, CK 14,390, lactic acid 2.1, UDS positive for opioids, cocaine, benzos, amphetamines, THC. Chest x-ray showed no acute disease. X-ray of hip and ankle showed no evidence of fracture CT of spine showed no acute abnormalities patient was admitted for further workup. On admission she was difficult to arouse.    Progress Note: 09/3 Pt seen in hospital bed; consents to have friend Marchelle Folks and boyfriend remain in the room. She denied SI, HI, AH/VH but all parties  have significant concern about discharge planning. Pt remained resolute in goals (sobriety, drug rehab).  Discussed my understanding of barriers to CIR (lack of speech/OT needs) and talked through different options with pt, supports and SW including 1) [facility] (residential substance program which can take wheelchair dependent pts) 2) SNF to drug rehab (unlikely to be paid for by insurance - friend Marchelle Folks plans to call/appeal) 3) short term staying with family, getting PT as outpt, attending CD-IOP and ultimately attending drug rehab after able to walk/care for self in setting of foot drop (least preferred). No family or support is willing to take her in long-term without rehab, however it sounds like there are some short-term options in the community.   Permission to call bf but not mother or sister. Please do not call her mother or sister.  Psychiatric ROS 08/29 Mood Symptoms Ongoing mild depressive sx.   Manic Symptoms None recently  Psychosis Symptoms Denies.  Collateral information:  Friend amanda in room  Psychiatric  History:  Information collected from chart review  Prev Dx/Sx: bipolar 1 disorder, borderline personality disorder, polysubstance use disorder, social anxiety disorder, anxiety, depression, chronic pain, seizures Current Psych Provider: Shanna Cisco, NP (Nurse Practitioner)   Current Meds: Cymbalta 120 mg daily, Caplyta 42 mg daily, Haldol 2 mg nightly, and trazodone 100 mg nightly as needed.  Patient is also prescribed Ambien 10 mg nightly,  gabapentin 400 mg 4 times daily, and Klonopin 1 mg 4 times daily  Previous Med Trials: Long medical history, most psychotropics have been attempted Therapy: Yes  Prior ECT: None Prior Psych Hospitalization: [facility]  05/07/2022, [facility]  03/14/2021, [facility] 2019, [facility] 2009 Prior Self Harm: Past suicidal attempts: 2023, 2021, past self harm, Prior Violence: yes  Family Psych History:Depression in her paternal grandmother;  Family Hx suicide: None found on chart review  Social History:  Access to weapons: Denies access  Tobacco use: yes, 1 ppd Alcohol use: No  8/23: BF in the room reports that she has been using fentanyl recreationally as well as takes a large amount of Klonopin which is prescribed to her, however she has been out of fentanyl for 2 days and not been taking her benzos, concern for acute opioid as well as benzodiazepine withdrawal.   Exam Findings  Psychiatric Specialty Exam:  Presentation  General Appearance: Casual; Fairly Groomed (multiple tattoos) Eye Contact:Good Speech:Clear and Coherent; Normal Rate Speech Volume:Normal Handedness:Right  Mood and Affect  Mood:-- (Worried, frustrated) Affect:Appropriate; Congruent  Thought Process  Thought Processes:Coherent; Goal Directed Descriptions of Associations:Intact Orientation:Full (Time, Place and Person) Thought Content:Logical History of Schizophrenia/Schizoaffective disorder:No data recorded Duration of Psychotic Symptoms:No data recorded Hallucinations:Hallucinations: None  Ideas of Reference:None Suicidal Thoughts:Suicidal Thoughts: No  Homicidal Thoughts:Homicidal Thoughts: No   Sensorium  Memory:Immediate Good; Recent Good; Remote Good Judgment:Fair Insight:Fair  Executive Functions   Concentration:Good Attention Span:Good Recall:Good Fund of Knowledge:Good Language:Good  Psychomotor Activity  Psychomotor Activity:Psychomotor Activity: Normal   Assets  Assets:Communication Skills; Intimacy  Sleep  Sleep:Sleep: Fair   Physical Exam: Vital signs:  Temp:  [98.4 F (36.9 C)-98.9 F (37.2 C)] 98.9 F (37.2 C) (09/03 0813) Pulse Rate:  [87] 87 (09/03 0813) Resp:  [16-17] 16 (09/03 0813) BP: (127-137)/(70-84) 127/70 (09/03 0813) SpO2:  [100 %] 100 % (09/03 0813)  Physical Exam Vitals and nursing note reviewed.  Constitutional:      Appearance: She is ill-appearing.  HENT:     Head: Normocephalic.  Pulmonary:     Effort: Pulmonary effort is normal.  Skin:    General: Skin is dry.  Neurological:     Mental Status: She is alert and oriented to person, place, and time.  Psychiatric:        Behavior: Behavior normal.        Judgment: Judgment normal.     Blood pressure 127/70, pulse 87, temperature 98.9 F (37.2 C), temperature source Oral, resp. rate 16, height 5\' 11"  (1.803 m), weight 79.3 kg, SpO2 100%. Body mass index is 24.38 kg/m.   Other History   These have been pulled in through the EMR, reviewed, and updated if appropriate.   Family History:   The patient's family history includes Arthritis in her maternal grandfather and maternal grandmother; Asthma in an other family member; COPD in an other family member; Cancer in an other family member; Depression in her paternal grandmother; Healthy in her father and mother; Hyperlipidemia in an other family member; Hypertension in an other family member; Stroke  in an other family member.  Medical History: Past Medical History:  Diagnosis Date   Allergic rhinitis    Anxiety    Arthritis    Asthma    Bipolar disorder (HCC)    Borderline personality disorder (HCC)    Bupropion overdose    Chronic pain syndrome    Depression    Family history of adverse reaction to anesthesia    Mom - N/V    Fibromyalgia    generalized   Hallucinations    02/08/22- have cleared mom said.   Migraines    Nasal polyps    OCD (obsessive compulsive disorder)    Ovarian cyst    left   Pneumonia    2021   PONV (postoperative nausea and vomiting)    Schizophrenia (HCC)    schizoaffective   Seizures (HCC)    patient attenped sudide by tak alot of her medication,.    Surgical History: Past Surgical History:  Procedure Laterality Date   ABDOMINAL HYSTERECTOMY     LAPAROSCOPIC TUBAL LIGATION Bilateral 06/13/2019   Procedure: LAPAROSCOPIC TUBAL LIGATION;  Surgeon: Hermina Staggers, MD;  Location: [facility];  Service: Gynecology;  Laterality: Bilateral;  FILSHIE CLIPS   NASAL SINUS SURGERY     ORIF ANKLE FRACTURE Right 02/09/2022   Procedure: OPEN TREATMENT OF RIGHT BIMALLEOLAR ANKLE FRACTURE;  Surgeon: Terance Hart, MD;  Location[facility] OR;  Service: Orthopedics;  Laterality: Right;   TUBAL LIGATION     tubes in ears     VAGINAL HYSTERECTOMY Bilateral 01/15/2020   Procedure: HYSTERECTOMY VAGINAL;  Surgeon: Hermina Staggers, MD;  Location: [facility] OR;  Service: Gynecology;  Laterality: Bilateral;   WISDOM TOOTH EXTRACTION      Medications:   Current Facility-Administered Medications:    acetaminophen (TYLENOL) tablet 650 mg, 650 mg, Oral, Q6H PRN, 650 mg at 10/20/22 1730 **OR** acetaminophen (TYLENOL) suppository 650 mg, 650 mg, Rectal, Q6H PRN, Dorrell, Robert, MD   alum & mag hydroxide-simeth (MAALOX/MYLANTA) 200-200-20 MG/5ML suspension 30 mL, 30 mL, Oral, Q4H PRN, Azucena Fallen, MD, 30 mL at 10/20/22 0045   doxycycline (VIBRA-TABS) tablet 100 mg, 100 mg, Oral, Q12H, Amin, Ankit C, MD, 100 mg at 10/26/22 0931   DULoxetine (CYMBALTA) DR capsule 120 mg, 120 mg, Oral, Daily, Margaretmary Dys, MD, 120 mg at 10/26/22 0931   feeding supplement (ENSURE ENLIVE / ENSURE PLUS) liquid 237 mL, 237 mL, Oral, BID BM, Margaretmary Dys, MD, 237 mL at  10/26/22 0935   gabapentin (NEURONTIN) capsule 300 mg, 300 mg, Oral, TID, Margaretmary Dys, MD, 300 mg at 10/26/22 0933   guaiFENesin (ROBITUSSIN) 100 MG/5ML liquid 5 mL, 5 mL, Oral, Q4H PRN, Amin, Ankit C, MD   hydrALAZINE (APRESOLINE) injection 10 mg, 10 mg, Intravenous, Q4H PRN, Amin, Ankit C, MD   ibuprofen (ADVIL) tablet 600 mg, 600 mg, Oral, Q4H PRN, Azucena Fallen, MD, 600 mg at 10/23/22 1338   ipratropium-albuterol (DUONEB) 0.5-2.5 (3) MG/3ML nebulizer solution 3 mL, 3 mL, Nebulization, Q4H PRN, Amin, Ankit C, MD   lumateperone tosylate (CAPLYTA) capsule 42 mg, 42 mg, Oral, Daily, Kimi Bordeau A, 42 mg at 10/26/22 0935   metoprolol tartrate (LOPRESSOR) injection 5 mg, 5 mg, Intravenous, Q4H PRN, Amin, Ankit C, MD   mupirocin ointment (BACTROBAN) 2 %, , Nasal, BID, Amin, Ankit C, MD, Given at 10/26/22 0939   nicotine (NICODERM CQ - dosed in mg/24 hours) patch 21 mg, 21 mg, Transdermal, Daily,  Rhetta Mura, MD, 21 mg at 10/26/22 0930   ondansetron (ZOFRAN) tablet 4 mg, 4 mg, Oral, Q6H PRN, 4 mg at 10/18/22 0936 **OR** ondansetron (ZOFRAN) injection 4 mg, 4 mg, Intravenous, Q6H PRN, Alan Mulder, MD, 4 mg at 10/18/22 2106   phenol (CHLORASEPTIC) mouth spray 1 spray, 1 spray, Mouth/Throat, PRN, Azucena Fallen, MD   potassium chloride SA (KLOR-CON M) CR tablet 80 mEq, 80 mEq, Oral, BID, Samtani, Jai-Gurmukh, MD, 80 mEq at 10/26/22 0931   senna-docusate (Senokot-S) tablet 1 tablet, 1 tablet, Oral, QHS PRN, Amin, Ankit C, MD   sodium chloride flush (NS) 0.9 % injection 10-40 mL, 10-40 mL, Intracatheter, Q12H, Azucena Fallen, MD, 10 mL at 10/25/22 2044   sodium chloride flush (NS) 0.9 % injection 10-40 mL, 10-40 mL, Intracatheter, PRN, Azucena Fallen, MD   traMADol Janean Sark) tablet 50 mg, 50 mg, Oral, Q12H PRN, Rhetta Mura, MD, 50 mg at 10/26/22 1054   traZODone (DESYREL) tablet 25 mg, 25 mg, Oral, QHS PRN, Margaretmary Dys, MD, 25 mg at 10/25/22 2043  Allergies: Allergies  Allergen Reactions   Keflex [Cephalexin] Anaphylaxis    Has tolerated amoxicillin 2019/2020

## 2022-10-27 ENCOUNTER — Telehealth (HOSPITAL_COMMUNITY): Payer: MEDICAID | Admitting: Psychiatry

## 2022-10-27 ENCOUNTER — Encounter (HOSPITAL_COMMUNITY): Payer: Self-pay

## 2022-10-27 DIAGNOSIS — F3189 Other bipolar disorder: Secondary | ICD-10-CM

## 2022-10-27 DIAGNOSIS — M6282 Rhabdomyolysis: Secondary | ICD-10-CM | POA: Diagnosis not present

## 2022-10-27 DIAGNOSIS — F1910 Other psychoactive substance abuse, uncomplicated: Secondary | ICD-10-CM | POA: Diagnosis not present

## 2022-10-27 LAB — VITAMIN B12: Vitamin B-12: 225 pg/mL (ref 180–914)

## 2022-10-27 MED ORDER — ENOXAPARIN SODIUM 40 MG/0.4ML IJ SOSY
40.0000 mg | PREFILLED_SYRINGE | INTRAMUSCULAR | Status: DC
  Administered 2022-10-27 – 2022-11-10 (×15): 40 mg via SUBCUTANEOUS
  Filled 2022-10-27 (×15): qty 0.4

## 2022-10-27 NOTE — Plan of Care (Signed)

## 2022-10-27 NOTE — Progress Notes (Signed)
Occupational Therapy Treatment Patient Details Name: Carmen Daniels MRN: [id number] DOB: 07-14-1985 Today's Date: 10/27/2022   History of present illness Pt is 37 yo presenting to [facility] ED 8/22 after being found down by family near vomit and multiple medications. Pt was found to have acute toxic metabolic encephalopathy and severe rhabdomyolysis. PMH: bipolar, substance abuse disorder, fibromyalgia, schizoaffective disorder, BPD, asthma, OCD, prior overdose and questionable suicidal ideation at outside facility.   OT comments  Patient making progress towards goals. Patient able to complete multiple sit<>stands from EOB to w/c, and then to shower bench. Patient also able to navigate w/c through bathroom threshhold with her feet, and manage RW in her lap to promote increased independence. Patient advocates to having the foot rests on her w/c to complete transfers, which makes her have to navigate a tight space without issue or imbalance 3x (despite OT recommendation and demonstration of removing foot rests). Patient at a supervision level for all ADLs in session, including being able to lift her legs up to her chest without issue or difficulty. As patient no longer needs inpatient psych admission, OT recommending return home and no need for follow up OT services. OT will continue to follow.       If plan is discharge home, recommend the following:  A little help with walking and/or transfers;A little help with bathing/dressing/bathroom   Equipment Recommendations  None recommended by OT    Recommendations for Other Services      Precautions / Restrictions Precautions Precautions: Fall Precaution Comments: R drop foot Restrictions Weight Bearing Restrictions: No       Mobility Bed Mobility Overal bed mobility: Independent                  Transfers Overall transfer level: Needs assistance Equipment used: Rolling walker (2 wheels) Transfers: Sit to/from Stand, Bed to  chair/wheelchair/BSC Sit to Stand: Supervision Stand pivot transfers: Supervision         General transfer comment: Patient able to transfer from bed to w/c, w/c to shower bench, and shower bench to w/c at supervision level     Balance Overall balance assessment: Needs assistance Sitting-balance support: No upper extremity supported, Feet supported Sitting balance-Leahy Scale: Good     Standing balance support: Bilateral upper extremity supported, During functional activity Standing balance-Leahy Scale: Fair Standing balance comment: reliant on UE support                           ADL either performed or assessed with clinical judgement   ADL Overall ADL's : Needs assistance/impaired     Grooming: Supervision/safety;Wash/dry face;Wash/dry hands;Oral care;Sitting Grooming Details (indicate cue type and reason): able to complete in front of sink in w/c     Lower Body Bathing: Supervison/ safety;Sitting/lateral leans       Lower Body Dressing: Supervision/safety;Sitting/lateral leans;Sit to/from stand   Toilet Transfer: Supervision/safety;Rolling walker (2 wheels);BSC/3in1 Toilet Transfer Details (indicate cue type and reason): simulated with w/c transfer         Functional mobility during ADLs: Supervision/safety General ADL Comments: Patient able to complete multiple sit<>stands from EOB to w/c, and then to shower bench. Patient also able to navigate w/c through bathroom threshhold with her feet, and manage RW in her lap to promote increased independence. Patient advocates to having the foot rests on her w/c to complete transfers, which makes her have to navigate a tight space without issue or imbalance 3x (despite OT recommendation and  demonstration of removing foot rests). Patient at a supervision level for all ADLs in session, including being able to lift her legs up to her chest without issue or difficulty.    Extremity/Trunk Assessment               Vision       Perception     Praxis      Cognition Arousal: Alert Behavior During Therapy: WFL for tasks assessed/performed Overall Cognitive Status: Within Functional Limits for tasks assessed                                 General Comments: Flat affect, demonstrates ability to problem solve wheelchair management without issue in novel environments        Exercises      Shoulder Instructions       General Comments      Pertinent Vitals/ Pain       Pain Assessment Pain Assessment: No/denies pain  Home Living                                          Prior Functioning/Environment              Frequency  Min 1X/week        Progress Toward Goals  OT Goals(current goals can now be found in the care plan section)  Progress towards OT goals: Progressing toward goals  Acute Rehab OT Goals OT Goal Formulation: With patient Time For Goal Achievement: 11/04/22 Potential to Achieve Goals: Good  Plan      Co-evaluation                 AM-PAC OT "6 Clicks" Daily Activity     Outcome Measure   Help from another person eating meals?: None Help from another person taking care of personal grooming?: None Help from another person toileting, which includes using toliet, bedpan, or urinal?: A Little Help from another person bathing (including washing, rinsing, drying)?: A Little Help from another person to put on and taking off regular upper body clothing?: None Help from another person to put on and taking off regular lower body clothing?: A Little 6 Click Score: 21    End of Session Equipment Utilized During Treatment: Rolling walker (2 wheels)  OT Visit Diagnosis: Unsteadiness on feet (R26.81);Muscle weakness (generalized) (M62.81)   Activity Tolerance Patient tolerated treatment well   Patient Left in bed;with call bell/phone within reach;with family/visitor present;with bed alarm set   Nurse Communication  Mobility status        Time: 1610-9604 OT Time Calculation (min): 28 min  Charges: OT General Charges $OT Visit: 1 Visit OT Treatments $Self Care/Home Management : 23-37 mins  Pollyann Glen E. Johnica Armwood, OTR/L Acute Rehabilitation Services [phone]   Cherlyn Cushing 10/27/2022, 2:51 PM

## 2022-10-27 NOTE — TOC Progression Note (Addendum)
Transition of Care [facility]) - Progression Note    Patient Details  Name: Carmen Daniels MRN: [id number] Date of Birth: October 02, 1985  Transition of [facility]) CM/SW Contact  Lorri Frederick, LCSW Phone Number: 10/27/2022, 9:34 AM  Clinical Narrative:   Message from [facility] residential drug treatment, Lupita Leash.  They cannot accept pt currently due to her unsteady gate.   0940: TC [facility].  Pt has not called yet for phone screening.  They cannot make a decision until she does.  CSW spoke with pt and Marchelle Folks in room, they will call [facility].  Pt continues to have no SNF bed offers.  CSW reached out to [facility] case mgr Arvil Persons for update on any options for SNF that she has found. [phone]  1200: TC [facility].  They are still waiting on pt to complete phone screening.    TC [facility].  She has sent out referrals and has not received any bed offers so far.  Waiting on call backs.   1500: PASSR initiated, docs uploaded to [facility].  Still pending.     Barriers to Discharge: Other (must enter comment) (SNF with no payer)  Expected Discharge Plan and Services In-house Referral: Clinical Social Work   Post Acute Care Choice: IP Rehab Living arrangements for the past 2 months: Single Family Home Expected Discharge Date: 10/22/22                                     Social Determinants of Health (SDOH) Interventions SDOH Screenings   Food Insecurity: No Food Insecurity (10/15/2022)  Housing: Low Risk  (10/15/2022)  Transportation Needs: No Transportation Needs (10/15/2022)  Utilities: Not At Risk (10/15/2022)  Alcohol Screen: Low Risk  (04/10/2017)  Depression (PHQ2-9): High Risk (08/04/2022)  Financial Resource Strain: Medium Risk (05/07/2022)   Received from [facility], [facility]  Social Connections: Unknown (06/24/2021)   Received from [facility], [facility]  Stress: Stress Concern Present (05/07/2022)   Received from [facility], [facility]  Tobacco Use: High  Risk (10/15/2022)    Readmission Risk Interventions     No data to display

## 2022-10-27 NOTE — Progress Notes (Addendum)
PROGRESS NOTE    Carmen Daniels  ZOX:[id number] DOB: August 27, 1985 DOA: 10/14/2022 PCP: Salli Real, MD   Brief Narrative: 37 year old with medical history significant for bipolar, substance abuse disorder, prior overdose and questionable suicidal ideation at outside facility.  She presents to the ED after being found down by family.  Nearby there was noted  vomit on multiple medications.  Estimated downtime approximately 20 hours.  Patient was admitted with concern of overdose and subsequent withdrawal, hospitalist was called for admission.  On admission she was found to have acute toxic metabolic encephalopathy and severe rhabdomyolysis.  She was started on aggressive IV fluids.  Subsequently rhabdo resolved.  She was evaluated by psychiatry who initially recommended inpatient psych admission.  Reevaluated by psych on 9/3 patient does not require inpatient psych admission, they recommend home health PT or skilled rehab.  Completed 7 days of doxycycline for multiple sites of small areas of cellulitis.  There was concern for right lower extremity peroneal nerve palsy.  AFO boot ordered.   Assessment & Plan:   Principal Problem:   Rhabdomyolysis Active Problems:   AMS (altered mental status)   Non-traumatic rhabdomyolysis   Encephalopathy, toxic   Polysubstance abuse (HCC)   Severe bipolar I disorder, most recent episode depressed (HCC)  1-Acute toxic encephalopathy, POA History of substance abuse disorder Concern for overdose, accidental versus intentional Toxic encephalopathy due to multidrug overdose. She has been  clear by psychiatric for outpatient care Back to baseline. Alert and conversant.    2-Rhabdomyolysis, nontraumatic CK level normalized.  Treated with IV fluids Resolved.   3-Right lower extremity erythema mild fluid-filled lesion improving Lower extremity palsy, right lower extremity peroneal palsy compression Patient with neuropathic pain, continue with Cymbalta and  Neurontin Plan to complete  doxycycline for 7 days MRI of the Lumbar Spine showed Chronic Lumbar arthropathy She might benefit from outpatient EMG. Patient to use AFO She has been seen previously by Dr. Susa Simmonds for bimalleolar fracture December 2023. Appreciate ortho evaluation. Plan to check ABI.   Hypokalemia/hypophosphatemia/hypomagnesemia: Replace  Lactic Acidosis: Resolved with fluids Diarrhea; start florastore, bentyl PRN. Monitor for fever   Bipolar disorder, borderline personality disorder: Trazodone as needed, on Caplyta  Estimated body mass index is 24.38 kg/m as calculated from the following:   Height as of this encounter: 5\' 11"  (1.803 m).   Weight as of this encounter: 79.3 kg.   DVT prophylaxis: Lovenox Code Status: Full code Family Communication: Care discussed with patient  Disposition Plan:  Status is: Inpatient Remains inpatient appropriate because: medical stable, awaiting safe discharge    Consultants:  psych  Procedures:  none  Antimicrobials:    Subjective: She report one episode of diarrhea, cramping abdominal pain.  I encourage her to work with PT, while she await disposition.   Objective: Vitals:   10/26/22 1446 10/26/22 2020 10/27/22 0450 10/27/22 0906  BP: 123/76 (!) 132/93 124/84 132/78  Pulse: 98 (!) 109 (!) 108 (!) 105  Resp: 18 17  17   Temp: 98.5 F (36.9 C) 98.4 F (36.9 C) 98.3 F (36.8 C) 98.6 F (37 C)  TempSrc: Oral Oral Oral Oral  SpO2: 100% 100% 100% 100%  Weight:      Height:        Intake/Output Summary (Last 24 hours) at 10/27/2022 1240 Last data filed at 10/27/2022 0455 Gross per 24 hour  Intake --  Output 200 ml  Net -200 ml   Filed Weights   10/15/22 0223  Weight: 79.3 kg  Examination:  General exam: NAD Respiratory system: CTA Cardiovascular system: S1, S 2 RRR Gastrointestinal system: BS present, soft, nt Central nervous system: Alert, conversant Extremities: right foot drop, mild redness.  Data  Reviewed: I have personally reviewed following labs and imaging studies  CBC: Recent Labs  Lab 10/21/22 0352 10/22/22 0640 10/24/22 0607 10/26/22 0936  WBC 13.4* 13.2* 9.3 9.6  NEUTROABS 9.8*  --   --  7.3  HGB 12.0 13.5 13.0 14.0  HCT 35.1* 39.2 38.8 40.7  MCV 83.6 82.7 84.2 85.9  PLT 358 350 368 404*   Basic Metabolic Panel: Recent Labs  Lab 10/21/22 0352 10/22/22 0640 10/23/22 0303 10/24/22 0607 10/26/22 0936  NA 134* 135 135 136  135 135  K 3.2* 3.5 3.2* 3.1*  2.9* 3.9  CL 101 100 99 101  101 101  CO2 26 26 24 24  25 24   GLUCOSE 101* 119* 93 127*  133* 113*  BUN <5* <5* 6 8  8  5*  CREATININE 0.66 0.67 0.85 0.67  0.67 0.62  CALCIUM 8.2* 8.7* 8.7* 8.6*  8.6* 9.3  MG 2.0 1.9 2.0 1.9  --   PHOS 3.7  --   --   --   --    GFR: Estimated Creatinine Clearance: 106.6 mL/min (by C-G formula based on SCr of 0.62 mg/dL). Liver Function Tests: Recent Labs  Lab 10/22/22 0640 10/24/22 0607  AST 49* 23  ALT 61* 35  ALKPHOS 76 62  BILITOT 1.3* 1.1  PROT 6.3* 6.3*  ALBUMIN 3.2* 3.0*   No results for input(s): "LIPASE", "AMYLASE" in the last 168 hours. No results for input(s): "AMMONIA" in the last 168 hours. Coagulation Profile: No results for input(s): "INR", "PROTIME" in the last 168 hours. Cardiac Enzymes: Recent Labs  Lab 10/21/22 0352 10/22/22 0640 10/23/22 0303  CKTOTAL 624* 237* 146   BNP (last 3 results) No results for input(s): "PROBNP" in the last 8760 hours. HbA1C: No results for input(s): "HGBA1C" in the last 72 hours. CBG: No results for input(s): "GLUCAP" in the last 168 hours. Lipid Profile: No results for input(s): "CHOL", "HDL", "LDLCALC", "TRIG", "CHOLHDL", "LDLDIRECT" in the last 72 hours. Thyroid Function Tests: No results for input(s): "TSH", "T4TOTAL", "FREET4", "T3FREE", "THYROIDAB" in the last 72 hours. Anemia Panel: No results for input(s): "VITAMINB12", "FOLATE", "FERRITIN", "TIBC", "IRON", "RETICCTPCT" in the last 72  hours. Sepsis Labs: No results for input(s): "PROCALCITON", "LATICACIDVEN" in the last 168 hours.  No results found for this or any previous visit (from the past 240 hour(s)).       Radiology Studies: No results found.      Scheduled Meds:  doxycycline  100 mg Oral Q12H   DULoxetine  120 mg Oral Daily   feeding supplement  237 mL Oral BID BM   gabapentin  300 mg Oral TID   lumateperone tosylate  42 mg Oral Daily   mupirocin ointment   Nasal BID   nicotine  21 mg Transdermal Daily   potassium chloride  40 mEq Oral Daily   sodium chloride flush  10-40 mL Intracatheter Q12H   Continuous Infusions:   LOS: 12 days    Time spent: 35 minutes    Zeven Kocak A Zayna Toste, MD [facility]   If 7PM-7AM, please contact night-coverage [email or web address]  10/27/2022, 12:40 PM

## 2022-10-27 NOTE — Progress Notes (Signed)
Physical Therapy Treatment Patient Details Name: Carmen Daniels MRN: [id number] DOB: 09/30/1985 Today's Date: 10/27/2022   History of Present Illness Pt is 37 yo presenting to [facility] ED 8/22 after being found down by family near vomit and multiple medications. Pt was found to have acute toxic metabolic encephalopathy and severe rhabdomyolysis. PMH: bipolar, substance abuse disorder, fibromyalgia, schizoaffective disorder, BPD, asthma, OCD, prior overdose and questionable suicidal ideation at outside facility.    PT Comments  Pt supine in bed on entry, friend in room, agreeable to wheelchair training. Working towards independence with wheelchair for possible inpatient substance abuse rehab. Pt able to doff PRAFO brace and don CAM boot, however very poor application to which pt replies "I can't feel it". PT acknowledges lack of feeling but provides education on increased time for regaining nerve action and need for using vision over feeling to make sure her boot is properly donned to decrease risk of further injury. Pt is independent with bed mobility, supervision with transfers and contact guard for ambulation. With pt's history of falling during hospitalization pt provided contact guard assist with ambulation when she reports increased dizziness. With education pt is able to manage wheelchair parts. PT will continue to work with pt on wheelchair training and ambulation with RW. Referred to mobility specialist for increased mobility during her hospitalization.    If plan is discharge home, recommend the following: Help with stairs or ramp for entrance;A little help with walking and/or transfers;Assistance with cooking/housework;Assist for transportation   Can travel by private vehicle      yes  Equipment Recommendations  None recommended by PT (Friend states today that she can grab needed DME from house.)       Precautions / Restrictions Precautions Precautions: Fall Precaution Comments: R drop  foot Restrictions Weight Bearing Restrictions: No     Mobility  Bed Mobility Overal bed mobility: Independent                  Transfers Overall transfer level: Needs assistance Equipment used: Rolling walker (2 wheels) Transfers: Sit to/from Stand, Bed to chair/wheelchair/BSC Sit to Stand: Supervision Stand pivot transfers: Supervision         General transfer comment: requires cuing for hand placement for power up and sitting down as she exhibits decreased eccentric control with lowering to seat    Ambulation/Gait Ambulation/Gait assistance: Contact guard assist Gait Distance (Feet): 15 Feet Assistive device: Rolling walker (2 wheels) Gait Pattern/deviations: Step-to pattern, Decreased weight shift to right, Decreased step length - left Gait velocity: decreased Gait velocity interpretation: <1.31 ft/sec, indicative of household ambulator   General Gait Details: vc for increased UE support especially with swing through of  L LE, complaints of dizziness with encouragement for longer distance ambulation       Corporate treasurer Wheelchair mobility: Yes Wheelchair propulsion: Both upper extremities Wheelchair parts: Needs assistance Distance: 100 Wheelchair Assistance Details (indicate cue type and reason): cues for leg rest management, initially unable to perform cuing for locking in place prior to placing legs on them          Balance Overall balance assessment: Needs assistance Sitting-balance support: No upper extremity supported, Feet supported Sitting balance-Leahy Scale: Good     Standing balance support: Bilateral upper extremity supported, During functional activity Standing balance-Leahy Scale: Fair Standing balance comment: Pt requires UE support for functional mobility due to R foot deficits  Cognition Arousal: Alert Behavior During Therapy: WFL for tasks  assessed/performed Overall Cognitive Status: Within Functional Limits for tasks assessed                                 General Comments: very flat, focuses on the things she can't do as opposed to problem solving           General Comments General comments (skin integrity, edema, etc.): VSS      Pertinent Vitals/Pain Pain Assessment Pain Assessment: 0-10 Faces Pain Scale: Hurts little more Pain Location: back Pain Descriptors / Indicators: Burning, Discomfort, Grimacing Pain Intervention(s): Monitored during session, Repositioned     PT Goals (current goals can now be found in the care plan section) Acute Rehab PT Goals PT Goal Formulation: With patient Time For Goal Achievement: 11/04/22 Potential to Achieve Goals: Good Progress towards PT goals: Progressing toward goals    Frequency    Min 1X/week       AM-PAC PT "6 Clicks" Mobility   Outcome Measure  Help needed turning from your back to your side while in a flat bed without using bedrails?: None Help needed moving from lying on your back to sitting on the side of a flat bed without using bedrails?: None Help needed moving to and from a bed to a chair (including a wheelchair)?: A Little Help needed standing up from a chair using your arms (e.g., wheelchair or bedside chair)?: A Little Help needed to walk in hospital room?: A Little Help needed climbing 3-5 steps with a railing? : A Lot 6 Click Score: 19    End of Session Equipment Utilized During Treatment: Gait belt;Other (comment) (CAM boot) Activity Tolerance: Patient tolerated treatment well Patient left: in bed;with call bell/phone within reach;with family/visitor present Nurse Communication: Mobility status PT Visit Diagnosis: Unsteadiness on feet (R26.81);Other abnormalities of gait and mobility (R26.89)     Time: 2725-3664 PT Time Calculation (min) (ACUTE ONLY): 50 min  Charges:    $Gait Training: 8-22 mins $Therapeutic  Activity: 8-22 mins $Wheel Chair Management: 8-22 mins PT General Charges $$ ACUTE PT VISIT: 1 Visit                     Shaquella Stamant B. Beverely Risen PT, DPT Acute Rehabilitation Services Please use secure chat or  Call Office [phone]     Elon Alas [facility] 10/27/2022, 3:34 PM

## 2022-10-27 NOTE — Progress Notes (Signed)
RE:  Carmen Daniels       Date of Birth: 05/26/1985      Date:  10/27/22        To Whom It May Concern:  Please be advised that the above-named patient will require a short-term nursing home stay - anticipated 30 days or less for rehabilitation and strengthening.  The plan is for return home.                 MD signature                Date

## 2022-10-27 NOTE — Consult Note (Addendum)
Addendum: I did review EKG yesterday and there was a large improvement in qtc.   [name] Consult Evaluation  Service Date: October 27, 2022 LOS:  LOS: 12 days    Primary Psychiatric Diagnoses  Bipolar I Disorder 2.  Borderline Personality Disorder 3.  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 female admitted medically on 10/14/2022  8:08 PM for being found missing and unconscious surrounded by vomit and pills. She carries the psychiatric diagnoses of bipolar I disorder, borderline personality disorder, chronic pain syndrome, and has a past medical history of migraines, seasonal allergies, asthma, ovarian cysts, pneumonia, past intentional overdose, allergies, seizures . She reports current drug use. She reports that she does not drink alcohol. Psychiatry was consulted for concern for suicide attempt, medication management by Azucena Fallen, MD  on 10/15/2022.   Her initial presentation of altered mental status was most consistent with an unintentional drug overdose. She met criteria for drug overdose based on patient report, toxicology results. Current outpatient centrally acting medications include atogepant, beclomethasone, clonazepam, duloxetine, gabapentin, haloperidol, lumateperone tosylate, metoclopramide, oxycodone, sumatriptan, trazodone and historically she has had a fair mixed response to these medications; notably she had not had a prolonged period of sobriety while under treatment. They are prescribed bothy by her outpt psych NP [name]) and PCP.  She was compliant with medications prior to admission as evidenced by chart review, although there is some concern for abuse of klonopin in ED notes; Klonopin has been successfully tapered this admission. On initial examination, patient was too somnolent for full exam - remained somnolent on re-eval 8/24 but was at least more interactive. Pt on subsequent exams has been pleasant and  cooperative. She answers all questions to the best of her ability.   Please see plan below for detailed recommendations. Today, 9/4, we continue to recommend outpt or residential treatment as her physical sx allow - will likely need to go to SNF as recommended by PT. She is highly motivated to get clean, and has displayed no agitation while admitted to the hospital.   She has been without suicidal ideation or extreme features of depression or mania throughout her hospitalization here (generally has had moderate depression); she is able to safely f/u psychiatric needs outpt or in IOP (depending on if she goes to SNF or HHPT).  Focus of treatment is on maximizing physical abilities.  [facility] is a rehab program that can accept pts in wheelchairs - this is also an appropriate dispo (although does not fully address PT needs)   Diagnoses:  Active Hospital problems: Principal Problem:   Rhabdomyolysis Active Problems:   AMS (altered mental status)   Non-traumatic rhabdomyolysis   Encephalopathy, toxic   Polysubstance abuse (HCC)   Severe bipolar I disorder, most recent episode depressed (HCC)     Plan   ## Psychiatric Medication Recommendations:  -- Continue Cymbalta 120 mg (outpatient regimen) for depression, neuropathic pain -- Continue lumateperone tosylate (caplyta) for bipolar disorder -- Continue trazodone 25 mg for sleep -- Continue gabapentin at 300 mg TID for neuropathic pain, anxiety  ## Medical Decision Making Capacity:  Capacity was not formally addressed during this encounter  ## Further Work-up:  -- Per primary  -- most recent EKG on 8/28 had QtC of 471.  -- Pertinent labwork reviewed earlier this admission includes: UDS (+amphetamines, benzos, opiates, cocaine, THC), elevated lactic acid, urinalysis (protein, bilirubin), elevated CK (Trend [id number]), elevated glucose Drug use: yes.  10/14/22 23:32  Amphetamines POSITIVE !  Barbiturates NONE DETECTED   Benzodiazepines POSITIVE !  Opiates POSITIVE !  COCAINE POSITIVE !  Tetrahydrocannabinol POSITIVE !   ## Disposition:  -- There are no current psychiatric contraindications to discharge at this time             -- d/w SW - next steps either HHPT or OPPT with CD-IOP or SNF --> drug rehab. Not good CIR candidate d/t lack of OT/speech needs. [facility] is possible.   ## Behavioral / Environmental:  --   Patients with borderline personality traits/disorder often use the language of physical pain to communicate both physical and emotional suffering. It is important to address pain complaints as they arise and attempt to identify an etiology, either organic or psychiatric. In patients with chronic pain, it is important to have a discussion with the patient about expectations about pain control.  ## Safety and Observation Level:  - Based on my clinical evaluation, I estimate the patient to be at low risk of self harm in the current setting   Suicide risk assessment  Patient has following modifiable risk factors for suicide: recklessness, which we are addressing by appropriate medication management for bipolar disorder; have d/c klonopin which decreases executive functioning.    Patient has following non-modifiable or demographic risk factors for suicide: history of suicide attempt, history of self harm behavior, and psychiatric hospitalization  Patient has the following protective factors against suicide: Supportive family, Supportive friends, and Cultural, spiritual, or religious beliefs that discourage suicide  Thank you for this consult request. Recommendations have been communicated to the primary team.  We will sign off at this time.   Linnaea Ahn A Reeder Brisby  Psychiatric and Social History   Relevant Aspects of Hospital Course:  Admitted on 10/14/2022 for found down at home by boyfriend. The Patient's family found her on the floor around vomit and pills. She reported been down for around 20  hours. She had bruising and wounds on her head and side. She was arousable however was noted to have episodes of apnea. Patient's family was concerned that she may be having withdrawal to fentanyl or benzos. Labs were obtained on presentation which were revealed WBC 24.4, hemoglobin 16.8, platelets 410, creatinine 1, CK 14,390, lactic acid 2.1, UDS positive for opioids, cocaine, benzos, amphetamines, THC. Chest x-ray showed no acute disease. X-ray of hip and ankle showed no evidence of fracture CT of spine showed no acute abnormalities patient was admitted for further workup. On admission she was difficult to arouse.    Progress Note: 09/4 Pt seen in chair wearing street clothes. We went over med changes (primarily dc of klonopin which has been well tolerated) through her admission. She denied SI, HI, AH/VH  - we disucssed that she should view SNF as a temproary but necessary step on the road to rehab. She slept well last night - discussed need for f/u with psych with multiple days of poor sleep in this pt with bipolar d/o.  Asked pt her ideal for 6 months from now (after peroneal nerve recovery, rehab) - sees self as sober and living independently. Laughed at appropriate points in conversation. Discussed psychiatry is signing off.    Permission to call bf but not mother or sister. Please do not call her mother or sister.  Psychiatric ROS 9/4 Mood Symptoms Ongoing mild depressive sx.   Manic Symptoms None recently  Psychosis Symptoms Denies.  Collateral information:  Friend amanda in room  Psychiatric History:  Information collected from chart review  Prev Dx/Sx: bipolar 1 disorder, borderline personality disorder, polysubstance use disorder, social anxiety disorder, anxiety, depression, chronic pain, seizures Current Psych Provider: Shanna Cisco, NP (Nurse Practitioner)  Current Meds: Cymbalta 120 mg daily, Caplyta 42 mg daily, Haldol 2 mg nightly, and trazodone 100 mg nightly as  needed.  Patient is also prescribed Ambien 10 mg nightly,  gabapentin 400 mg 4 times daily, and Klonopin 1 mg 4 times daily  Previous Med Trials: Long medical history, most psychotropics have been attempted Therapy: Yes  Prior ECT: None Prior Psych Hospitalization: [facility]  05/07/2022, [facility]  03/14/2021, [facility] 2019, [facility] 2009 Prior Self Harm: Past suicidal attempts: 2023, 2021, past self harm, Prior Violence: yes  Family Psych History:Depression in her paternal grandmother;  Family Hx suicide: None found on chart review  Social History:  Access to weapons: Denies access  Tobacco use: yes, 1 ppd Alcohol use: No  8/23: BF in the room reports that she has been using fentanyl recreationally as well as takes a large amount of Klonopin which is prescribed to her, however she has been out of fentanyl for 2 days and not been taking her benzos, concern for acute opioid as well as benzodiazepine withdrawal.   Exam Findings  Psychiatric Specialty Exam:  Presentation  General Appearance: Casual; Fairly Groomed Eye Contact:Good Speech:Clear and Coherent; Normal Rate Speech Volume:Normal Handedness:Right  Mood and Affect  Mood:-- ("Frustrated with my insurance company") Affect:Appropriate; Congruent; Full Range  Thought Process  Thought Processes:Coherent; Goal Directed Descriptions of Associations:Intact Orientation:Full (Time, Place and Person) Thought Content:Logical History of Schizophrenia/Schizoaffective disorder:No data recorded Duration of Psychotic Symptoms:No data recorded Hallucinations:Hallucinations: None  Ideas of Reference:None Suicidal Thoughts:Suicidal Thoughts: No  Homicidal Thoughts:Homicidal Thoughts: No   Sensorium  Memory:Immediate Good; Recent Good; Remote Good Judgment:Fair Insight:Fair  Executive Functions  Concentration:Good Attention Span:Good Recall:Good Fund of  Knowledge:Good Language:Good  Psychomotor Activity  Psychomotor Activity:Psychomotor Activity: Normal   Assets  Assets:Communication Skills; Intimacy  Sleep  Sleep:Sleep: Good   Physical Exam: Vital signs:  Temp:  [98.3 F (36.8 C)-98.6 F (37 C)] 98.4 F (36.9 C) (09/04 1300) Pulse Rate:  [105-109] 109 (09/04 1300) Resp:  [17] 17 (09/04 1300) BP: (119-132)/(78-93) 119/85 (09/04 1300) SpO2:  [99 %-100 %] 99 % (09/04 1300)  Physical Exam Vitals and nursing note reviewed.  HENT:     Head: Normocephalic.  Pulmonary:     Effort: Pulmonary effort is normal.  Skin:    General: Skin is dry.  Neurological:     Mental Status: She is alert and oriented to person, place, and time.  Psychiatric:        Behavior: Behavior normal.        Judgment: Judgment normal.     Blood pressure 119/85, pulse (!) 109, temperature 98.4 F (36.9 C), temperature source Oral, resp. rate 17, height 5\' 11"  (1.803 m), weight 79.3 kg, SpO2 99%. Body mass index is 24.38 kg/m.   Other History   These have been pulled in through the EMR, reviewed, and updated if appropriate.   Family History:   The patient's family history includes Arthritis in her maternal grandfather and maternal grandmother; Asthma in an other family member; COPD in an other family member; Cancer in an other family member; Depression in her paternal grandmother; Healthy in her father and mother; Hyperlipidemia in an other family member; Hypertension in an other family member; Stroke in an other family member.  Medical History:  Past Medical History:  Diagnosis Date   Allergic rhinitis    Anxiety    Arthritis    Asthma    Bipolar disorder (HCC)    Borderline personality disorder (HCC)    Bupropion overdose    Chronic pain syndrome    Depression    Family history of adverse reaction to anesthesia    Mom - N/V   Fibromyalgia    generalized   Hallucinations    02/08/22- have cleared mom said.   Migraines    Nasal  polyps    OCD (obsessive compulsive disorder)    Ovarian cyst    left   Pneumonia    2021   PONV (postoperative nausea and vomiting)    Schizophrenia (HCC)    schizoaffective   Seizures (HCC)    patient attenped sudide by tak alot of her medication,.    Surgical History: Past Surgical History:  Procedure Laterality Date   ABDOMINAL HYSTERECTOMY     LAPAROSCOPIC TUBAL LIGATION Bilateral 06/13/2019   Procedure: LAPAROSCOPIC TUBAL LIGATION;  Surgeon: Hermina Staggers, MD;  Location: [facility];  Service: Gynecology;  Laterality: Bilateral;  FILSHIE CLIPS   NASAL SINUS SURGERY     ORIF ANKLE FRACTURE Right 02/09/2022   Procedure: OPEN TREATMENT OF RIGHT BIMALLEOLAR ANKLE FRACTURE;  Surgeon: Terance Hart, MD;  [facility];  Service: Orthopedics;  Laterality: Right;   TUBAL LIGATION     tubes in ears     VAGINAL HYSTERECTOMY Bilateral 01/15/2020   Procedure: HYSTERECTOMY VAGINAL;  Surgeon: Hermina Staggers, MD;  Location: [facility] OR;  Service: Gynecology;  Laterality: Bilateral;   WISDOM TOOTH EXTRACTION      Medications:   Current Facility-Administered Medications:    acetaminophen (TYLENOL) tablet 650 mg, 650 mg, Oral, Q6H PRN, 650 mg at 10/20/22 1730 **OR** acetaminophen (TYLENOL) suppository 650 mg, 650 mg, Rectal, Q6H PRN, Dorrell, Robert, MD   alum & mag hydroxide-simeth (MAALOX/MYLANTA) 200-200-20 MG/5ML suspension 30 mL, 30 mL, Oral, Q4H PRN, Azucena Fallen, MD, 30 mL at 10/20/22 0045   doxycycline (VIBRA-TABS) tablet 100 mg, 100 mg, Oral, Q12H, Amin, Ankit C, MD, 100 mg at 10/27/22 1028   DULoxetine (CYMBALTA) DR capsule 120 mg, 120 mg, Oral, Daily, Margaretmary Dys, MD, 120 mg at 10/27/22 1028   enoxaparin (LOVENOX) injection 40 mg, 40 mg, Subcutaneous, Q24H, Regalado, Belkys A, MD, 40 mg at 10/27/22 1450   feeding supplement (ENSURE ENLIVE / ENSURE PLUS) liquid 237 mL, 237 mL, Oral, BID BM, Margaretmary Dys, MD, 237 mL  at 10/27/22 1029   gabapentin (NEURONTIN) capsule 300 mg, 300 mg, Oral, TID, Margaretmary Dys, MD, 300 mg at 10/27/22 1028   guaiFENesin (ROBITUSSIN) 100 MG/5ML liquid 5 mL, 5 mL, Oral, Q4H PRN, Amin, Ankit C, MD   hydrALAZINE (APRESOLINE) injection 10 mg, 10 mg, Intravenous, Q4H PRN, Amin, Ankit C, MD   ibuprofen (ADVIL) tablet 600 mg, 600 mg, Oral, Q4H PRN, Azucena Fallen, MD, 600 mg at 10/23/22 1338   ipratropium-albuterol (DUONEB) 0.5-2.5 (3) MG/3ML nebulizer solution 3 mL, 3 mL, Nebulization, Q4H PRN, Amin, Ankit C, MD   lumateperone tosylate (CAPLYTA) capsule 42 mg, 42 mg, Oral, Daily, Arsema Tusing A, 42 mg at 10/27/22 1031   metoprolol tartrate (LOPRESSOR) injection 5 mg, 5 mg, Intravenous, Q4H PRN, Amin, Ankit C, MD   mupirocin ointment (BACTROBAN) 2 %, , Nasal, BID, Amin, Ankit C, MD, Given at 10/27/22 1029   nicotine (NICODERM CQ -  dosed in mg/24 hours) patch 21 mg, 21 mg, Transdermal, Daily, Samtani, Jai-Gurmukh, MD, 21 mg at 10/27/22 1027   ondansetron (ZOFRAN) tablet 4 mg, 4 mg, Oral, Q6H PRN, 4 mg at 10/18/22 0936 **OR** ondansetron (ZOFRAN) injection 4 mg, 4 mg, Intravenous, Q6H PRN, Dorrell, Robert, MD, 4 mg at 10/18/22 2106   phenol (CHLORASEPTIC) mouth spray 1 spray, 1 spray, Mouth/Throat, PRN, Azucena Fallen, MD   potassium chloride SA (KLOR-CON M) CR tablet 40 mEq, 40 mEq, Oral, Daily, Samtani, Jai-Gurmukh, MD, 40 mEq at 10/27/22 1028   senna-docusate (Senokot-S) tablet 1 tablet, 1 tablet, Oral, QHS PRN, Amin, Ankit C, MD   sodium chloride flush (NS) 0.9 % injection 10-40 mL, 10-40 mL, Intracatheter, Q12H, Azucena Fallen, MD, 10 mL at 10/27/22 1042   sodium chloride flush (NS) 0.9 % injection 10-40 mL, 10-40 mL, Intracatheter, PRN, Azucena Fallen, MD   traMADol Janean Sark) tablet 50 mg, 50 mg, Oral, Q12H PRN, Rhetta Mura, MD, 50 mg at 10/27/22 1028   traZODone (DESYREL) tablet 25 mg, 25 mg, Oral, QHS PRN, Margaretmary Dys, MD, 25 mg at 10/26/22 2138  Allergies: Allergies  Allergen Reactions   Keflex [Cephalexin] Anaphylaxis    Has tolerated amoxicillin 2019/2020

## 2022-10-28 DIAGNOSIS — T50914A Poisoning by multiple unspecified drugs, medicaments and biological substances, undetermined, initial encounter: Secondary | ICD-10-CM

## 2022-10-28 DIAGNOSIS — F1910 Other psychoactive substance abuse, uncomplicated: Secondary | ICD-10-CM | POA: Diagnosis not present

## 2022-10-28 DIAGNOSIS — R4182 Altered mental status, unspecified: Secondary | ICD-10-CM

## 2022-10-28 DIAGNOSIS — F3189 Other bipolar disorder: Secondary | ICD-10-CM | POA: Diagnosis not present

## 2022-10-28 MED ORDER — DICYCLOMINE HCL 10 MG PO CAPS: 10.0000 mg | ORAL_CAPSULE | Freq: Three times a day (TID) | ORAL | Status: DC | PRN

## 2022-10-28 MED ORDER — VITAMIN B-12 1000 MCG PO TABS
500.0000 ug | ORAL_TABLET | Freq: Every day | ORAL | Status: DC
  Administered 2022-10-28 – 2022-11-11 (×15): 500 ug via ORAL
  Filled 2022-10-28 (×15): qty 1

## 2022-10-28 MED ORDER — SACCHAROMYCES BOULARDII 250 MG PO CAPS
250.0000 mg | ORAL_CAPSULE | Freq: Two times a day (BID) | ORAL | Status: DC
  Administered 2022-10-28 – 2022-11-11 (×29): 250 mg via ORAL
  Filled 2022-10-28 (×29): qty 1

## 2022-10-28 NOTE — Progress Notes (Signed)
Mobility Specialist: Progress Note   10/28/22 1431  Mobility  Activity Ambulated with assistance in room  Level of Assistance Standby assist, set-up cues, supervision of patient - no hands on  Assistive Device Front wheel walker  Activity Response Tolerated poorly  Mobility Referral Yes  $Mobility charge 1 Mobility  Mobility Specialist Start Time (ACUTE ONLY) 1353  Mobility Specialist Stop Time (ACUTE ONLY) 1358  Mobility Specialist Time Calculation (min) (ACUTE ONLY) 5 min    Pt was agreeable to mobility session but requested to go to [facility] - received in bed. MS deferred further ambulation d/t pt feeling extremely dizzy, slightly nauseous, and stating that her ears were ringing. Returned to bed without fault. Left in bed with all needs met, call bell in reach. Bed alarm on.   Maurene Capes Mobility Specialist Please contact via SecureChat or Rehab office at [phone]

## 2022-10-28 NOTE — Consult Note (Signed)
Reason for Consult:Right foot neuropraxia Referring Physician: Hartley Barefoot Time called: 7829 Time at bedside: 1201   Carmen Daniels is an 37 y.o. female.  HPI: Carmen Daniels overdosed about 2 weeks ago and was down for about 20h. She was admitted and when she came to she did not have feeling or movement in her right foot. Since admission she's had some shooting pains in the foot but no return of movement.  Past Medical History:  Diagnosis Date   Allergic rhinitis    Anxiety    Arthritis    Asthma    Bipolar disorder (HCC)    Borderline personality disorder (HCC)    Bupropion overdose    Chronic pain syndrome    Depression    Family history of adverse reaction to anesthesia    Mom - N/V   Fibromyalgia    generalized   Hallucinations    02/08/22- have cleared mom said.   Migraines    Nasal polyps    OCD (obsessive compulsive disorder)    Ovarian cyst    left   Pneumonia    2021   PONV (postoperative nausea and vomiting)    Schizophrenia (HCC)    schizoaffective   Seizures (HCC)    patient attenped sudide by tak alot of her medication,.    Past Surgical History:  Procedure Laterality Date   ABDOMINAL HYSTERECTOMY     LAPAROSCOPIC TUBAL LIGATION Bilateral 06/13/2019   Procedure: LAPAROSCOPIC TUBAL LIGATION;  Surgeon: Hermina Staggers, MD;  Location: [facility];  Service: Gynecology;  Laterality: Bilateral;  FILSHIE CLIPS   NASAL SINUS SURGERY     ORIF ANKLE FRACTURE Right 02/09/2022   Procedure: OPEN TREATMENT OF RIGHT BIMALLEOLAR ANKLE FRACTURE;  Surgeon: Terance Hart, MD;  [facility] OR;  Service: Orthopedics;  Laterality: Right;   TUBAL LIGATION     tubes in ears     VAGINAL HYSTERECTOMY Bilateral 01/15/2020   Procedure: HYSTERECTOMY VAGINAL;  Surgeon: Hermina Staggers, MD;  Location: [facility] OR;  Service: Gynecology;  Laterality: Bilateral;   WISDOM TOOTH EXTRACTION      Family History  Problem Relation Age of Onset   Healthy Mother    Healthy  Father    Arthritis Maternal Grandmother    Arthritis Maternal Grandfather    Depression Paternal Grandmother    Hyperlipidemia Other    Stroke Other    Hypertension Other    Cancer Other    COPD Other    Asthma Other     Social History:  reports that she has been smoking cigarettes. She has a 7.5 pack-year smoking history. She has never used smokeless tobacco. She reports current drug use. She reports that she does not drink alcohol.  Allergies:  Allergies  Allergen Reactions   Keflex [Cephalexin] Anaphylaxis    Has tolerated amoxicillin 2019/2020    Medications: I have reviewed the patient's current medications.  No results found for this or any previous visit (from the past 48 hour(s)).  No results found.  Review of Systems  HENT:  Negative for ear discharge, ear pain, hearing loss and tinnitus.   Eyes:  Negative for photophobia and pain.  Respiratory:  Negative for cough and shortness of breath.   Cardiovascular:  Negative for chest pain.  Gastrointestinal:  Negative for abdominal pain, nausea and vomiting.  Genitourinary:  Negative for dysuria, flank pain, frequency and urgency.  Musculoskeletal:  Positive for arthralgias (Right foot). Negative for back pain, myalgias and neck pain.  Neurological:  Positive for weakness (Right foot) and numbness (Right foot). Negative for dizziness and headaches.  Hematological:  Does not bruise/bleed easily.  Psychiatric/Behavioral:  The patient is not nervous/anxious.    Blood pressure 131/89, pulse (!) 124, temperature 98.7 F (37.1 C), temperature source Oral, resp. rate 17, height 5\' 11"  (1.803 m), weight 79.3 kg, SpO2 100%. Physical Exam Constitutional:      General: She is not in acute distress.    Appearance: She is well-developed. She is not diaphoretic.  HENT:     Head: Normocephalic and atraumatic.  Eyes:     General: No scleral icterus.       Right eye: No discharge.        Left eye: No discharge.      Conjunctiva/sclera: Conjunctivae normal.  Cardiovascular:     Rate and Rhythm: Normal rate and regular rhythm.  Pulmonary:     Effort: Pulmonary effort is normal. No respiratory distress.  Musculoskeletal:     Cervical back: Normal range of motion.     Comments: LLE Contusion/abrasion anterolateral ankle, no ecchymosis or rash  Nontender  No knee or ankle effusion  Knee stable to varus/ valgus and anterior/posterior stress  Sens DPN, TN absent, SPN minimally sensate  Motor EHL, ext, flex, evers 0/5  DP 0, PT 0, No significant edema  Skin:    General: Skin is warm and dry.  Neurological:     Mental Status: She is alert.  Psychiatric:        Mood and Affect: Mood normal.        Behavior: Behavior normal.     Assessment/Plan: Right foot neuropraxia -- Agree with current plan of foot drop splint/CAM boot, PT, and time. Will check ABI's; if abnormal will need vascular surgery consult. May f/u with Dr. Susa Simmonds in 2-3 weeks.    Freeman Caldron, PA-C Orthopedic Surgery [phone] 10/28/2022, 12:11 PM

## 2022-10-29 ENCOUNTER — Inpatient Hospital Stay (HOSPITAL_COMMUNITY): Payer: MEDICAID

## 2022-10-29 DIAGNOSIS — R202 Paresthesia of skin: Secondary | ICD-10-CM

## 2022-10-29 DIAGNOSIS — F1910 Other psychoactive substance abuse, uncomplicated: Secondary | ICD-10-CM | POA: Diagnosis not present

## 2022-10-29 DIAGNOSIS — F3189 Other bipolar disorder: Secondary | ICD-10-CM | POA: Diagnosis not present

## 2022-10-29 MED ORDER — SUMATRIPTAN SUCCINATE 100 MG PO TABS
100.0000 mg | ORAL_TABLET | ORAL | Status: AC | PRN
  Administered 2022-10-29 – 2022-11-01 (×2): 100 mg via ORAL
  Filled 2022-10-29 (×3): qty 1

## 2022-10-29 MED ORDER — LACTATED RINGERS IV BOLUS
1000.0000 mL | Freq: Once | INTRAVENOUS | Status: AC
  Administered 2022-10-29: 1000 mL via INTRAVENOUS

## 2022-10-29 MED ORDER — CHLORHEXIDINE GLUCONATE CLOTH 2 % EX PADS
6.0000 | MEDICATED_PAD | Freq: Every day | CUTANEOUS | Status: DC
  Administered 2022-10-29 – 2022-11-02 (×5): 6 via TOPICAL

## 2022-10-29 NOTE — Progress Notes (Signed)
Mobility Specialist: Progress Note   10/29/22 1556  Mobility  Activity Ambulated with assistance in hallway  Level of Assistance Standby assist, set-up cues, supervision of patient - no hands on  Assistive Device Front wheel walker  Distance Ambulated (ft) 20 ft  Activity Response Tolerated well  Mobility Referral Yes  $Mobility charge 1 Mobility  Mobility Specialist Start Time (ACUTE ONLY) 1528  Mobility Specialist Stop Time (ACUTE ONLY) 1536  Mobility Specialist Time Calculation (min) (ACUTE ONLY) 8 min    Pt was agreeable to mobility session - received in bed. Had c/o feeling dizzy, nauseous, and stating that her ears were ringing. Returned to bed without fault. Left in bed with all needs met, call bell in reach.   Maurene Capes Mobility Specialist Please contact via SecureChat or Rehab office at [phone]

## 2022-10-29 NOTE — Progress Notes (Signed)
Physical Therapy Treatment Patient Details Name: Carmen Daniels MRN: [id number] DOB: [birth year] Today's Date: 10/29/2022   History of Present Illness Pt is 37 yo presenting to [facility] ED 8/22 after being found down by family near vomit and multiple medications. Pt was found to have acute toxic metabolic encephalopathy and severe rhabdomyolysis. PMH: bipolar, substance abuse disorder, fibromyalgia, schizoaffective disorder, BPD, asthma, OCD, prior overdose and questionable suicidal ideation at outside facility.    PT Comments  Pt making progress towards her goals. With encouragement pt able to progress her mobility is more agreeable to sit up in chair at end of session to work on passive core strengthening. Pt continues to be limited in safe mobility by decreased dorsi and active plantar flexion R>L ankle, as well as general weakness, and onset of dizziness and ringing in ears with increased exertion. Pt is currently independent for bed mobility, supervision for transfers and contact guard with frequent seated rest breaks for ambulation. D/c plans remain appropriate at this time. PT will continue to follow acutely.   If plan is discharge home, recommend the following: Help with stairs or ramp for entrance;A little help with walking and/or transfers;Assistance with cooking/housework;Assist for transportation   Can travel by private vehicle      Yes  Equipment Recommendations  None recommended by PT (Friend states today that she can grab needed DME from house.)       Precautions / Restrictions Precautions Precautions: Fall Precaution Comments: R drop foot Restrictions Weight Bearing Restrictions: No     Mobility  Bed Mobility Overal bed mobility: Independent                  Transfers Overall transfer level: Needs assistance Equipment used: Rolling walker (2 wheels) Transfers: Sit to/from Stand, Bed to chair/wheelchair/BSC Sit to Stand: Supervision           General transfer  comment: able to power up from low bed surface, continues to require reminder of hand placement, good power up and self steadying    Ambulation/Gait Ambulation/Gait assistance: Contact guard assist Gait Distance (Feet): 15 Feet (+15, +10) Assistive device: Rolling walker (2 wheels) Gait Pattern/deviations: Step-to pattern, Decreased weight shift to right, Decreased step length - left Gait velocity: decreased Gait velocity interpretation: <1.31 ft/sec, indicative of household ambulator   General Gait Details: vc for increased R hip hike for foot clearance with boot, improved mobility with shoe on L foot to offset height of CAM boot, pt with complaints of dizziness, and ringing in her ears requiring rest in her wheelchair before progressing ambulation, despite discomfort with being in hallway with sounds and bright lights able to encourage her for additonal short bout of ambulation before return to room          Balance Overall balance assessment: Needs assistance Sitting-balance support: No upper extremity supported, Feet supported Sitting balance-Leahy Scale: Good Sitting balance - Comments: able to perform dressing seated on EOB   Standing balance support: Bilateral upper extremity supported, During functional activity Standing balance-Leahy Scale: Fair Standing balance comment: Pt requires UE support for functional mobility due to R foot deficits                            Cognition Arousal: Alert Behavior During Therapy: WFL for tasks assessed/performed Overall Cognitive Status: Within Functional Limits for tasks assessed  General Comments: very flat, improved problem solving today and willingness to perform multiple bouts of ambulation        Exercises General Exercises - Lower Extremity Long Arc Quad: AROM, Both, 10 reps, Seated Hip Flexion/Marching: AROM, Both, 10 reps, Seated Toe Raises: AAROM, Right, 10 reps,  AROM, Left, Seated Heel Raises: AAROM, Right, 10 reps, AROM, Left, Seated Other Exercises Other Exercises: AAROM R ankle, pt cued to assist with DF and PF.exhibits trace activation of both DF and PF    General Comments General comments (skin integrity, edema, etc.): VSS, pt able to don CAM boot and shoe with visualization of foot positioning      Pertinent Vitals/Pain Pain Assessment Pain Assessment: Faces Faces Pain Scale: Hurts little more Breathing: normal Facial Expression: smiling or inexpressive Body Language: relaxed Consolability: no need to console Pain Location: back Pain Descriptors / Indicators: Burning, Discomfort, Grimacing Pain Intervention(s): Limited activity within patient's tolerance, Monitored during session, Repositioned     PT Goals (current goals can now be found in the care plan section) Acute Rehab PT Goals Patient Stated Goal: to go to rehab PT Goal Formulation: With patient Time For Goal Achievement: 11/04/22 Potential to Achieve Goals: Good Progress towards PT goals: Progressing toward goals    Frequency    Min 1X/week       AM-PAC PT "6 Clicks" Mobility   Outcome Measure  Help needed turning from your back to your side while in a flat bed without using bedrails?: None Help needed moving from lying on your back to sitting on the side of a flat bed without using bedrails?: None Help needed moving to and from a bed to a chair (including a wheelchair)?: A Little Help needed standing up from a chair using your arms (e.g., wheelchair or bedside chair)?: A Little Help needed to walk in hospital room?: A Little Help needed climbing 3-5 steps with a railing? : A Lot 6 Click Score: 19    End of Session Equipment Utilized During Treatment: Gait belt;Other (comment) (CAM boot) Activity Tolerance: Patient tolerated treatment well Patient left: with call bell/phone within reach;in chair;with chair alarm set Nurse Communication: Mobility status PT  Visit Diagnosis: Unsteadiness on feet (R26.81);Other abnormalities of gait and mobility (R26.89)     Time: 1610-9604 PT Time Calculation (min) (ACUTE ONLY): 27 min  Charges:    $Gait Training: 8-22 mins $Therapeutic Exercise: 8-22 mins PT General Charges $$ ACUTE PT VISIT: 1 Visit                     Maylie Ashton B. Beverely Risen PT, DPT Acute Rehabilitation Services Please use secure chat or  Call Office [phone]    Elon Alas [facility] 10/29/2022, 12:21 PM

## 2022-10-29 NOTE — Progress Notes (Signed)
ABI exam has been completed.   Results can be found under chart review under CV PROC. 10/29/2022 3:04 PM Jedrek Dinovo RVT, RDMS

## 2022-10-29 NOTE — Progress Notes (Signed)
PROGRESS NOTE    Carmen Daniels  ZOX:[id number] DOB: Jun 11, 1985 DOA: 10/14/2022 PCP: Salli Real, MD   Brief Narrative: 37 year old with medical history significant for bipolar, substance abuse disorder, prior overdose and questionable suicidal ideation at outside facility.  She presents to the ED after being found down by family.  Nearby there was noted  vomit on multiple medications.  Estimated downtime approximately 20 hours.  Patient was admitted with concern of overdose and subsequent withdrawal, hospitalist was called for admission.  On admission she was found to have acute toxic metabolic encephalopathy and severe rhabdomyolysis.  She was started on aggressive IV fluids.  Subsequently rhabdo resolved.  She was evaluated by psychiatry who initially recommended inpatient psych admission.  Reevaluated by psych on 9/3 patient does not require inpatient psych admission, they recommend home health PT or skilled rehab.  Completed 7 days of doxycycline for multiple sites of small areas of cellulitis.  There was concern for right lower extremity peroneal nerve palsy.  AFO boot ordered.   Assessment & Plan:   Principal Problem:   Rhabdomyolysis Active Problems:   AMS (altered mental status)   Non-traumatic rhabdomyolysis   Encephalopathy, toxic   Polysubstance abuse (HCC)   Severe bipolar I disorder, most recent episode depressed (HCC)   Other bipolar disorder (HCC)  1-Acute toxic encephalopathy, POA History of substance abuse disorder Concern for overdose, accidental versus intentional Toxic encephalopathy due to multidrug overdose. She has been  clear by psychiatric for outpatient care Back to baseline. Alert and conversant.    2-Rhabdomyolysis, nontraumatic CK level normalized.  Treated with IV fluids Resolved.   3-Right lower extremity erythema mild fluid-filled lesion improving Lower extremity palsy, right lower extremity peroneal palsy compression Patient with neuropathic pain,  continue with Cymbalta and Neurontin Plan to complete  doxycycline for 7 days MRI of the Lumbar Spine showed Chronic Lumbar arthropathy She might benefit from outpatient EMG. Patient to use AFO She has been seen previously by Dr. Susa Simmonds for bimalleolar fracture December 2023. Appreciate ortho evaluation. ABI ordered.   Tachycardia, suspect hypovolemia, she had some diarrhea.  Will give IV bolus.   Hypokalemia/hypophosphatemia/hypomagnesemia: Replaced  Lactic Acidosis: Resolved with fluids Diarrhea; started  florastore, bentyl PRN. Monitor for fever   Bipolar disorder, borderline personality disorder: Trazodone as needed, on Caplyta Migraine: resume home imiprex Estimated body mass index is 24.38 kg/m as calculated from the following:   Height as of this encounter: 5\' 11"  (1.803 m).   Weight as of this encounter: 79.3 kg.   DVT prophylaxis: Lovenox Code Status: Full code Family Communication: Care discussed with patient  Disposition Plan:  Status is: Inpatient Remains inpatient appropriate because: medical stable, awaiting safe discharge    Consultants:  psych  Procedures:  none  Antimicrobials:    Subjective: She is having headaches today. Foot pain  Objective: Vitals:   10/28/22 2006 10/29/22 0522 10/29/22 0925 10/29/22 1250  BP: (!) 105/49 121/85 115/87 127/89  Pulse: (!) 111 (!) 107 (!) 126   Resp: 16 16 18 18   Temp:   98.1 F (36.7 C) 98 F (36.7 C)  TempSrc:      SpO2: 99% 100% 99% 99%  Weight:      Height:       No intake or output data in the 24 hours ending 10/29/22 1348  Filed Weights   10/15/22 0223  Weight: 79.3 kg    Examination:  General exam: NAD Respiratory system: CTA Cardiovascular system: S 1, S 2 RRR Gastrointestinal  system: BS present, soft, nt Central nervous system: alert, conversant Extremities: right foot drop, mild redness.  Data Reviewed: I have personally reviewed following labs and imaging studies  CBC: Recent  Labs  Lab 10/24/22 0607 10/26/22 0936  WBC 9.3 9.6  NEUTROABS  --  7.3  HGB 13.0 14.0  HCT 38.8 40.7  MCV 84.2 85.9  PLT 368 404*   Basic Metabolic Panel: Recent Labs  Lab 10/23/22 0303 10/24/22 0607 10/26/22 0936  NA 135 136  135 135  K 3.2* 3.1*  2.9* 3.9  CL 99 101  101 101  CO2 24 24  25 24   GLUCOSE 93 127*  133* 113*  BUN 6 8  8  5*  CREATININE 0.85 0.67  0.67 0.62  CALCIUM 8.7* 8.6*  8.6* 9.3  MG 2.0 1.9  --    GFR: Estimated Creatinine Clearance: 106.6 mL/min (by C-G formula based on SCr of 0.62 mg/dL). Liver Function Tests: Recent Labs  Lab 10/24/22 0607  AST 23  ALT 35  ALKPHOS 62  BILITOT 1.1  PROT 6.3*  ALBUMIN 3.0*   No results for input(s): "LIPASE", "AMYLASE" in the last 168 hours. No results for input(s): "AMMONIA" in the last 168 hours. Coagulation Profile: No results for input(s): "INR", "PROTIME" in the last 168 hours. Cardiac Enzymes: Recent Labs  Lab 10/23/22 0303  CKTOTAL 146   BNP (last 3 results) No results for input(s): "PROBNP" in the last 8760 hours. HbA1C: No results for input(s): "HGBA1C" in the last 72 hours. CBG: No results for input(s): "GLUCAP" in the last 168 hours. Lipid Profile: No results for input(s): "CHOL", "HDL", "LDLCALC", "TRIG", "CHOLHDL", "LDLDIRECT" in the last 72 hours. Thyroid Function Tests: No results for input(s): "TSH", "T4TOTAL", "FREET4", "T3FREE", "THYROIDAB" in the last 72 hours. Anemia Panel: No results for input(s): "VITAMINB12", "FOLATE", "FERRITIN", "TIBC", "IRON", "RETICCTPCT" in the last 72 hours. Sepsis Labs: No results for input(s): "PROCALCITON", "LATICACIDVEN" in the last 168 hours.  No results found for this or any previous visit (from the past 240 hour(s)).       Radiology Studies: No results found.      Scheduled Meds:  Chlorhexidine Gluconate Cloth  6 each Topical Q0600   vitamin B-12  500 mcg Oral Daily   DULoxetine  120 mg Oral Daily   enoxaparin (LOVENOX)  injection  40 mg Subcutaneous Q24H   feeding supplement  237 mL Oral BID BM   gabapentin  300 mg Oral TID   lumateperone tosylate  42 mg Oral Daily   mupirocin ointment   Nasal BID   nicotine  21 mg Transdermal Daily   potassium chloride  40 mEq Oral Daily   saccharomyces boulardii  250 mg Oral BID   sodium chloride flush  10-40 mL Intracatheter Q12H   Continuous Infusions:  lactated ringers       LOS: 14 days    Time spent: 35 minutes    Aryn Kops A Arielis Leonhart, MD [facility]   If 7PM-7AM, please contact night-coverage [email or web address]  10/29/2022, 1:48 PM

## 2022-10-29 NOTE — Progress Notes (Signed)
PROGRESS NOTE   Late entrance.   Carmen Daniels  WUJ:[id number] DOB: 09-Jan-1986 DOA: 10/14/2022 PCP: Salli Real, MD   Brief Narrative: 37 year old with medical history significant for bipolar, substance abuse disorder, prior overdose and questionable suicidal ideation at outside facility.  She presents to the ED after being found down by family.  Nearby there was noted  vomit on multiple medications.  Estimated downtime approximately 20 hours.  Patient was admitted with concern of overdose and subsequent withdrawal, hospitalist was called for admission.  On admission she was found to have acute toxic metabolic encephalopathy and severe rhabdomyolysis.  She was started on aggressive IV fluids.  Subsequently rhabdo resolved.  She was evaluated by psychiatry who initially recommended inpatient psych admission.  Reevaluated by psych on 9/3 patient does not require inpatient psych admission, they recommend home health PT or skilled rehab.  Completed 7 days of doxycycline for multiple sites of small areas of cellulitis.  There was concern for right lower extremity peroneal nerve palsy.  AFO boot ordered.   Assessment & Plan:   Principal Problem:   Rhabdomyolysis Active Problems:   AMS (altered mental status)   Non-traumatic rhabdomyolysis   Encephalopathy, toxic   Polysubstance abuse (HCC)   Severe bipolar I disorder, most recent episode depressed (HCC)   Other bipolar disorder (HCC)  1-Acute toxic encephalopathy, POA History of substance abuse disorder Concern for overdose, accidental versus intentional Toxic encephalopathy due to multidrug overdose. She has been  clear by psychiatric for outpatient care Back to baseline. Alert and conversant.    2-Rhabdomyolysis, nontraumatic CK level normalized.  Treated with IV fluids Resolved.   3-Right lower extremity erythema mild fluid-filled lesion improving Lower extremity palsy, right lower extremity peroneal palsy compression Patient with  neuropathic pain, continue with Cymbalta and Neurontin Plan to complete  doxycycline for 7 days MRI of the Lumbar Spine showed Chronic Lumbar arthropathy She might benefit from outpatient EMG. Patient to use AFO She has been seen previously by Dr. Susa Simmonds for bimalleolar fracture December 2023. Appreciate ortho evaluation. Plan to check ABI.   Hypokalemia/hypophosphatemia/hypomagnesemia: Replace  Lactic Acidosis: Resolved with fluids Diarrhea; start florastore, bentyl PRN. Monitor for fever   Bipolar disorder, borderline personality disorder: Trazodone as needed, on Caplyta  Estimated body mass index is 24.38 kg/m as calculated from the following:   Height as of this encounter: 5\' 11"  (1.803 m).   Weight as of this encounter: 79.3 kg.   DVT prophylaxis: Lovenox Code Status: Full code Family Communication: Care discussed with patient  Disposition Plan:  Status is: Inpatient Remains inpatient appropriate because: medical stable, awaiting safe discharge    Consultants:  psych  Procedures:  none  Antimicrobials:    Subjective: She report one episode of diarrhea, cramping abdominal pain.  I encourage her to work with PT, while she await disposition.   Objective: Vitals:   10/28/22 2006 10/29/22 0522 10/29/22 0925 10/29/22 1250  BP: (!) 105/49 121/85 115/87 127/89  Pulse: (!) 111 (!) 107 (!) 126   Resp: 16 16 18 18   Temp:   98.1 F (36.7 C) 98 F (36.7 C)  TempSrc:      SpO2: 99% 100% 99% 99%  Weight:      Height:       No intake or output data in the 24 hours ending 10/29/22 1356  Filed Weights   10/15/22 0223  Weight: 79.3 kg    Examination:  General exam: NAD Respiratory system: CTA Cardiovascular system: S1, S 2 RRR  Gastrointestinal system: BS present, soft, nt Central nervous system: Alert, conversant Extremities: right foot drop, mild redness.  Data Reviewed: I have personally reviewed following labs and imaging studies  CBC: Recent Labs  Lab  10/24/22 0607 10/26/22 0936  WBC 9.3 9.6  NEUTROABS  --  7.3  HGB 13.0 14.0  HCT 38.8 40.7  MCV 84.2 85.9  PLT 368 404*   Basic Metabolic Panel: Recent Labs  Lab 10/23/22 0303 10/24/22 0607 10/26/22 0936  NA 135 136  135 135  K 3.2* 3.1*  2.9* 3.9  CL 99 101  101 101  CO2 24 24  25 24   GLUCOSE 93 127*  133* 113*  BUN 6 8  8  5*  CREATININE 0.85 0.67  0.67 0.62  CALCIUM 8.7* 8.6*  8.6* 9.3  MG 2.0 1.9  --    GFR: Estimated Creatinine Clearance: 106.6 mL/min (by C-G formula based on SCr of 0.62 mg/dL). Liver Function Tests: Recent Labs  Lab 10/24/22 0607  AST 23  ALT 35  ALKPHOS 62  BILITOT 1.1  PROT 6.3*  ALBUMIN 3.0*   No results for input(s): "LIPASE", "AMYLASE" in the last 168 hours. No results for input(s): "AMMONIA" in the last 168 hours. Coagulation Profile: No results for input(s): "INR", "PROTIME" in the last 168 hours. Cardiac Enzymes: Recent Labs  Lab 10/23/22 0303  CKTOTAL 146   BNP (last 3 results) No results for input(s): "PROBNP" in the last 8760 hours. HbA1C: No results for input(s): "HGBA1C" in the last 72 hours. CBG: No results for input(s): "GLUCAP" in the last 168 hours. Lipid Profile: No results for input(s): "CHOL", "HDL", "LDLCALC", "TRIG", "CHOLHDL", "LDLDIRECT" in the last 72 hours. Thyroid Function Tests: No results for input(s): "TSH", "T4TOTAL", "FREET4", "T3FREE", "THYROIDAB" in the last 72 hours. Anemia Panel: No results for input(s): "VITAMINB12", "FOLATE", "FERRITIN", "TIBC", "IRON", "RETICCTPCT" in the last 72 hours. Sepsis Labs: No results for input(s): "PROCALCITON", "LATICACIDVEN" in the last 168 hours.  No results found for this or any previous visit (from the past 240 hour(s)).       Radiology Studies: No results found.      Scheduled Meds:  Chlorhexidine Gluconate Cloth  6 each Topical Q0600   vitamin B-12  500 mcg Oral Daily   DULoxetine  120 mg Oral Daily   enoxaparin (LOVENOX) injection  40  mg Subcutaneous Q24H   feeding supplement  237 mL Oral BID BM   gabapentin  300 mg Oral TID   lumateperone tosylate  42 mg Oral Daily   mupirocin ointment   Nasal BID   nicotine  21 mg Transdermal Daily   potassium chloride  40 mEq Oral Daily   saccharomyces boulardii  250 mg Oral BID   sodium chloride flush  10-40 mL Intracatheter Q12H   Continuous Infusions:  lactated ringers       LOS: 14 days    Time spent: 35 minutes    Jewel Mcafee A Alka Falwell, MD [facility]   If 7PM-7AM, please contact night-coverage [email or web address]  10/29/2022, 1:56 PM

## 2022-10-29 NOTE — Progress Notes (Signed)
Received iv consult on behalf of pt. At this time midline still flushes but no blood return. No other suitable veins for peripheral iv insertion found at this time. Primary rn notified and encouraged to reach back out to IV team if issues with midline persists.

## 2022-10-30 DIAGNOSIS — F1910 Other psychoactive substance abuse, uncomplicated: Secondary | ICD-10-CM | POA: Diagnosis not present

## 2022-10-30 DIAGNOSIS — F3189 Other bipolar disorder: Secondary | ICD-10-CM | POA: Diagnosis not present

## 2022-10-30 LAB — CBC
HCT: 41.9 % (ref 36.0–46.0)
Hemoglobin: 14.2 g/dL (ref 12.0–15.0)
MCH: 29.6 pg (ref 26.0–34.0)
MCHC: 33.9 g/dL (ref 30.0–36.0)
MCV: 87.3 fL (ref 80.0–100.0)
Platelets: 371 10*3/uL (ref 150–400)
RBC: 4.8 MIL/uL (ref 3.87–5.11)
RDW: 13.7 % (ref 11.5–15.5)
WBC: 8.4 10*3/uL (ref 4.0–10.5)
nRBC: 0 % (ref 0.0–0.2)

## 2022-10-30 LAB — BASIC METABOLIC PANEL
Anion gap: 13 (ref 5–15)
BUN: 9 mg/dL (ref 6–20)
CO2: 23 mmol/L (ref 22–32)
Calcium: 9.1 mg/dL (ref 8.9–10.3)
Chloride: 101 mmol/L (ref 98–111)
Creatinine, Ser: 0.71 mg/dL (ref 0.44–1.00)
GFR, Estimated: >60 mL/min (ref >60)
Glucose, Bld: 114 mg/dL — ABNORMAL HIGH (ref 70–99)
Potassium: 3.6 mmol/L (ref 3.5–5.1)
Sodium: 137 mmol/L (ref 135–145)

## 2022-10-30 MED ORDER — SODIUM CHLORIDE 0.9 % IV SOLN: INTRAVENOUS | Status: AC

## 2022-10-30 NOTE — Progress Notes (Signed)
   10/30/22 1405  Mobility  Activity Ambulated with assistance in room  Level of Assistance Standby assist, set-up cues, supervision of patient - no hands on  Assistive Device Front wheel walker  Distance Ambulated (ft) 25 ft  Activity Response Tolerated fair  Mobility Referral Yes  $Mobility charge 1 Mobility  Mobility Specialist Start Time (ACUTE ONLY) 1352  Mobility Specialist Stop Time (ACUTE ONLY) 1402  Mobility Specialist Time Calculation (min) (ACUTE ONLY) 10 min   Mobility Specialist: Progress Note  Pt agreeable to mobility session - received in bed. Required SB using RW with c/o her ears ringing and ongoing dizziness with exertion. RN notified. Pt returned to chair with all needs met - call bell within reach. Visitors present.   Barnie Mort, BS Mobility Specialist Please contact via SecureChat or Rehab office at [phone].

## 2022-10-30 NOTE — Progress Notes (Signed)
PROGRESS NOTE    CARLIA Daniels  LOV:[id number] DOB: May 17, 1985 DOA: 10/14/2022 PCP: Salli Real, MD   Brief Narrative: 37 year old with medical history significant for bipolar, substance abuse disorder, prior overdose and questionable suicidal ideation at outside facility.  She presents to the ED after being found down by family.  Nearby there was noted  vomit on multiple medications.  Estimated downtime approximately 20 hours.  Patient was admitted with concern of overdose and subsequent withdrawal, hospitalist was called for admission.  On admission she was found to have acute toxic metabolic encephalopathy and severe rhabdomyolysis.  She was started on aggressive IV fluids.  Subsequently rhabdo resolved.  She was evaluated by psychiatry who initially recommended inpatient psych admission.  Reevaluated by psych on 9/3 patient does not require inpatient psych admission, they recommend home health PT or skilled rehab.  Completed 7 days of doxycycline for multiple sites of small areas of cellulitis.  There was concern for right lower extremity peroneal nerve palsy.  AFO boot ordered.   Assessment & Plan:   Principal Problem:   Rhabdomyolysis Active Problems:   AMS (altered mental status)   Non-traumatic rhabdomyolysis   Encephalopathy, toxic   Polysubstance abuse (HCC)   Severe bipolar I disorder, most recent episode depressed (HCC)   Other bipolar disorder (HCC)  1-Acute toxic encephalopathy, POA History of substance abuse disorder Concern for overdose, accidental versus intentional Toxic encephalopathy due to multidrug overdose. She has been  clear by psychiatric for outpatient care Back to baseline. Alert and conversant.    2-Rhabdomyolysis, nontraumatic CK level normalized.  Treated with IV fluids Resolved.   3-Right lower extremity erythema mild fluid-filled lesion improving Lower extremity palsy, right lower extremity peroneal palsy compression Patient with neuropathic pain,  continue with Cymbalta and Neurontin Plan to complete  doxycycline for 7 days MRI of the Lumbar Spine showed Chronic Lumbar arthropathy She might benefit from outpatient EMG. Patient to use AFO She has been seen previously by Dr. Susa Simmonds for bimalleolar fracture December 2023. Appreciate ortho evaluation. ABI ordered.   Tachycardia, suspect hypovolemia, she had some diarrhea.  Continue with IV fluids. Check orthostatic .  She denies chest pain   Hypokalemia/hypophosphatemia/hypomagnesemia: Replaced  Lactic Acidosis: Resolved with fluids Diarrhea; started  florastore, bentyl PRN. Monitor for fever   Bipolar disorder, borderline personality disorder: Trazodone as needed, on Caplyta Migraine: resume home imiprex Estimated body mass index is 24.38 kg/m as calculated from the following:   Height as of this encounter: 5\' 11"  (1.803 m).   Weight as of this encounter: 79.3 kg.   DVT prophylaxis: Lovenox Code Status: Full code Family Communication: Care discussed with patient  Disposition Plan:  Status is: Inpatient Remains inpatient appropriate because: medical stable, awaiting safe discharge    Consultants:  psych  Procedures:  none  Antimicrobials:    Subjective: Report headaches is better  Diarrhea is better.  Report dizziness on ambulation  Objective: Vitals:   10/29/22 1250 10/29/22 2007 10/30/22 0516 10/30/22 0840  BP: 127/89 103/76 129/83 123/82  Pulse:  92 81 (!) 102  Resp: 18 17 17 17   Temp: 98 F (36.7 C) 98.6 F (37 C) 98.6 F (37 C) 98.3 F (36.8 C)  TempSrc:  Oral Oral Oral  SpO2: 99% 99% 97% 100%  Weight:      Height:        Intake/Output Summary (Last 24 hours) at 10/30/2022 1340 Last data filed at 10/30/2022 0845 Gross per 24 hour  Intake 240 ml  Output --  Net 240 ml    Filed Weights   10/15/22 0223  Weight: 79.3 kg    Examination:  General exam: NAD Respiratory system: CTA Cardiovascular system: S 1, S 2 RRR Gastrointestinal  system: BS present, soft nt Central nervous system:alert Extremities: right foot drop, mild redness.  Data Reviewed: I have personally reviewed following labs and imaging studies  CBC: Recent Labs  Lab 10/24/22 0607 10/26/22 0936 10/30/22 0713  WBC 9.3 9.6 8.4  NEUTROABS  --  7.3  --   HGB 13.0 14.0 14.2  HCT 38.8 40.7 41.9  MCV 84.2 85.9 87.3  PLT 368 404* 371   Basic Metabolic Panel: Recent Labs  Lab 10/24/22 0607 10/26/22 0936 10/30/22 0713  NA 136  135 135 137  K 3.1*  2.9* 3.9 3.6  CL 101  101 101 101  CO2 24  25 24 23   GLUCOSE 127*  133* 113* 114*  BUN 8  8 5* 9  CREATININE 0.67  0.67 0.62 0.71  CALCIUM 8.6*  8.6* 9.3 9.1  MG 1.9  --   --    GFR: Estimated Creatinine Clearance: 106.6 mL/min (by C-G formula based on SCr of 0.71 mg/dL). Liver Function Tests: Recent Labs  Lab 10/24/22 0607  AST 23  ALT 35  ALKPHOS 62  BILITOT 1.1  PROT 6.3*  ALBUMIN 3.0*   No results for input(s): "LIPASE", "AMYLASE" in the last 168 hours. No results for input(s): "AMMONIA" in the last 168 hours. Coagulation Profile: No results for input(s): "INR", "PROTIME" in the last 168 hours. Cardiac Enzymes: No results for input(s): "CKTOTAL", "CKMB", "CKMBINDEX", "TROPONINI" in the last 168 hours.  BNP (last 3 results) No results for input(s): "PROBNP" in the last 8760 hours. HbA1C: No results for input(s): "HGBA1C" in the last 72 hours. CBG: No results for input(s): "GLUCAP" in the last 168 hours. Lipid Profile: No results for input(s): "CHOL", "HDL", "LDLCALC", "TRIG", "CHOLHDL", "LDLDIRECT" in the last 72 hours. Thyroid Function Tests: No results for input(s): "TSH", "T4TOTAL", "FREET4", "T3FREE", "THYROIDAB" in the last 72 hours. Anemia Panel: No results for input(s): "VITAMINB12", "FOLATE", "FERRITIN", "TIBC", "IRON", "RETICCTPCT" in the last 72 hours. Sepsis Labs: No results for input(s): "PROCALCITON", "LATICACIDVEN" in the last 168 hours.  No results  found for this or any previous visit (from the past 240 hour(s)).       Radiology Studies: VAS [place] ABI WITH/WO TBI  Result Date: 10/29/2022  LOWER EXTREMITY DOPPLER STUDY Patient Name:  Carmen Daniels  Date of Exam:   10/29/2022 Medical Rec #: [id number]       Accession #:    [id number] Date of Birth: March 22, 1985        Patient Gender: F Patient Age:   12 years Exam Location:  [facility] Procedure:      VAS [place] ABI WITH/WO TBI Referring Phys: MICHAEL JEFFERY --------------------------------------------------------------------------------  Indications: "numbness and tingling" High Risk Factors: Current smoker.  Comparison Study: No previous exams Performing Technologist: Hill, Jody RVT, RDMS  Examination Guidelines: A complete evaluation includes at minimum, Doppler waveform signals and systolic blood pressure reading at the level of bilateral brachial, anterior tibial, and posterior tibial arteries, when vessel segments are accessible. Bilateral testing is considered an integral part of a complete examination. Photoelectric Plethysmograph (PPG) waveforms and toe systolic pressure readings are included as required and additional duplex testing as needed. Limited examinations for reoccurring indications may be performed as noted.  ABI Findings: +--------+------------------+-----+---------+--------+ Right   Rt Pressure (mmHg)IndexWaveform Comment  +--------+------------------+-----+---------+--------+  [phone]                    triphasic         +--------+------------------+-----+---------+--------+ PTA     121               0.98 biphasic          +--------+------------------+-----+---------+--------+ DP      114               0.92 biphasic          +--------+------------------+-----+---------+--------+ +--------+------------------+-----+---------+-------+ Left    Lt Pressure (mmHg)IndexWaveform Comment +--------+------------------+-----+---------+-------+ Brachial                        triphasic        +--------+------------------+-----+---------+-------+ PTA     110               0.89 biphasic         +--------+------------------+-----+---------+-------+ DP      118               0.95 biphasic         +--------+------------------+-----+---------+-------+  Summary: Right: Resting right ankle-brachial index is within normal range. Left: Resting left ankle-brachial index is within normal range. *See table(s) above for measurements and observations.     Preliminary         Scheduled Meds:  Chlorhexidine Gluconate Cloth  6 each Topical Q0600   vitamin B-12  500 mcg Oral Daily   DULoxetine  120 mg Oral Daily   enoxaparin (LOVENOX) injection  40 mg Subcutaneous Q24H   feeding supplement  237 mL Oral BID BM   gabapentin  300 mg Oral TID   lumateperone tosylate  42 mg Oral Daily   mupirocin ointment   Nasal BID   nicotine  21 mg Transdermal Daily   potassium chloride  40 mEq Oral Daily   saccharomyces boulardii  250 mg Oral BID   sodium chloride flush  10-40 mL Intracatheter Q12H   Continuous Infusions:  sodium chloride 100 mL/hr at 10/30/22 1305     LOS: 15 days    Time spent: 35 minutes    Nneoma Harral A Rjay Revolorio, MD [facility]   If 7PM-7AM, please contact night-coverage [email or web address]  10/30/2022, 1:40 PM

## 2022-10-30 NOTE — Plan of Care (Signed)
  Problem: Education: Goal: Knowledge of General Education information will improve Description: Including pain rating scale, medication(s)/side effects and non-pharmacologic comfort measures Outcome: Progressing   Problem: Health Behavior/Discharge Planning: Goal: Ability to manage health-related needs will improve Outcome: Progressing   Problem: Clinical Measurements: Goal: Ability to maintain clinical measurements within normal limits will improve Outcome: Progressing Goal: Will remain free from infection Outcome: Progressing Goal: Diagnostic test results will improve Outcome: Progressing Goal: Respiratory complications will improve Outcome: Progressing Goal: Cardiovascular complication will be avoided Outcome: Progressing   Problem: Nutrition: Goal: Adequate nutrition will be maintained Outcome: Progressing   Problem: Coping: Goal: Level of anxiety will decrease Outcome: Progressing   Problem: Safety: Goal: Ability to remain free from injury will improve Outcome: Progressing   Problem: Skin Integrity: Goal: Risk for impaired skin integrity will decrease Outcome: Progressing

## 2022-10-31 ENCOUNTER — Inpatient Hospital Stay (HOSPITAL_COMMUNITY): Payer: MEDICAID

## 2022-10-31 DIAGNOSIS — M6282 Rhabdomyolysis: Secondary | ICD-10-CM | POA: Diagnosis not present

## 2022-10-31 LAB — D-DIMER, QUANTITATIVE: D-Dimer, Quant: 1.88 ug{FEU}/mL — ABNORMAL HIGH (ref 0.00–0.50)

## 2022-10-31 MED ORDER — IOHEXOL 350 MG/ML SOLN
75.0000 mL | Freq: Once | INTRAVENOUS | Status: AC | PRN
  Administered 2022-10-31: 75 mL via INTRAVENOUS

## 2022-10-31 MED ORDER — DOXYCYCLINE HYCLATE 100 MG PO TABS
100.0000 mg | ORAL_TABLET | Freq: Two times a day (BID) | ORAL | Status: AC
  Administered 2022-10-31 – 2022-11-02 (×6): 100 mg via ORAL
  Filled 2022-10-31 (×6): qty 1

## 2022-10-31 MED ORDER — SODIUM CHLORIDE 0.9 % IV SOLN: INTRAVENOUS | Status: AC

## 2022-10-31 NOTE — Plan of Care (Signed)

## 2022-10-31 NOTE — Progress Notes (Signed)
PROGRESS NOTE    Carmen Daniels  WUJ:[id number] DOB: April 14, 1985 DOA: 10/14/2022 PCP: Salli Real, MD   Brief Narrative: 37 year old with medical history significant for bipolar, substance abuse disorder, prior overdose and questionable suicidal ideation at outside facility.  She presents to the ED after being found down by family.  Nearby there was noted  vomit on multiple medications.  Estimated downtime approximately 20 hours.  Patient was admitted with concern of overdose and subsequent withdrawal, hospitalist was called for admission.  On admission she was found to have acute toxic metabolic encephalopathy and severe rhabdomyolysis.  She was started on aggressive IV fluids.  Subsequently rhabdo resolved.  She was evaluated by psychiatry who initially recommended inpatient psych admission.  Reevaluated by psych on 9/3 patient does not require inpatient psych admission, they recommend home health PT or skilled rehab.  Completed 7 days of doxycycline for multiple sites of small areas of cellulitis.  There was concern for right lower extremity peroneal nerve palsy.  AFO boot ordered.   Assessment & Plan:   Principal Problem:   Rhabdomyolysis Active Problems:   AMS (altered mental status)   Non-traumatic rhabdomyolysis   Encephalopathy, toxic   Polysubstance abuse (HCC)   Severe bipolar I disorder, most recent episode depressed (HCC)   Other bipolar disorder (HCC)  1-Acute toxic encephalopathy, POA History of substance abuse disorder Concern for overdose, accidental versus intentional Toxic encephalopathy due to multidrug overdose. She has been  clear by psychiatric for outpatient care Back to baseline. Alert and conversant.    2-Rhabdomyolysis, nontraumatic CK level normalized.  Treated with IV fluids Resolved.   3-Right lower extremity erythema mild fluid-filled lesion improving Lower extremity palsy, right lower extremity peroneal palsy compression Patient with neuropathic pain,  continue with Cymbalta and Neurontin Completed  doxycycline for 7 days, will extend it for 3 more days. Has some redness near knee.  MRI of the Lumbar Spine showed Chronic Lumbar arthropathy She might benefit from outpatient EMG. Patient to use AFO She has been seen previously by Dr. Susa Simmonds for bimalleolar fracture December 2023. Appreciate ortho evaluation. ABI normal.   Tachycardia, persist after IV fluids.  Continue with IV fluids D dimer elevated. Will proceed with CT chest and doppler.   Hypokalemia/hypophosphatemia/hypomagnesemia: Replaced  Lactic Acidosis: Resolved with fluids Diarrhea; started  florastore, bentyl PRN. Monitor for fever   Bipolar disorder, borderline personality disorder: Trazodone as needed, on Caplyta Migraine: resume home imiprex Estimated body mass index is 24.38 kg/m as calculated from the following:   Height as of this encounter: 5\' 11"  (1.803 m).   Weight as of this encounter: 79.3 kg.   DVT prophylaxis: Lovenox Code Status: Full code Family Communication: Care discussed with patient  Disposition Plan:  Status is: Inpatient Remains inpatient appropriate because: medical stable, awaiting safe discharge    Consultants:  psych  Procedures:  none  Antimicrobials:    Subjective: Headache better.  Having foot pain. Knee pain  Objective: Vitals:   10/30/22 1943 10/31/22 0454 10/31/22 0815 10/31/22 1244  BP: 112/83 (!) 128/98 118/85 128/85  Pulse: 81 84 (!) 116 (!) 115  Resp: 17 18 17 16   Temp: 98.5 F (36.9 C) 98.3 F (36.8 C) 98.4 F (36.9 C) 98.4 F (36.9 C)  TempSrc: Oral Oral Oral Oral  SpO2: 99% 100% 100% 98%  Weight:      Height:        Intake/Output Summary (Last 24 hours) at 10/31/2022 1341 Last data filed at 10/31/2022 0457 Gross per  24 hour  Intake 427.84 ml  Output --  Net 427.84 ml    Filed Weights   10/15/22 0223  Weight: 79.3 kg    Examination:  General exam: NAD Respiratory system: CTA Cardiovascular  system: S 1, S 2 RRR Gastrointestinal system: BS present, soft, nt Central nervous system:alert Extremities: right foot drop, mild redness.  Data Reviewed: I have personally reviewed following labs and imaging studies  CBC: Recent Labs  Lab 10/26/22 0936 10/30/22 0713  WBC 9.6 8.4  NEUTROABS 7.3  --   HGB 14.0 14.2  HCT 40.7 41.9  MCV 85.9 87.3  PLT 404* 371   Basic Metabolic Panel: Recent Labs  Lab 10/26/22 0936 10/30/22 0713  NA 135 137  K 3.9 3.6  CL 101 101  CO2 24 23  GLUCOSE 113* 114*  BUN 5* 9  CREATININE 0.62 0.71  CALCIUM 9.3 9.1   GFR: Estimated Creatinine Clearance: 106.6 mL/min (by C-G formula based on SCr of 0.71 mg/dL). Liver Function Tests: No results for input(s): "AST", "ALT", "ALKPHOS", "BILITOT", "PROT", "ALBUMIN" in the last 168 hours.  No results for input(s): "LIPASE", "AMYLASE" in the last 168 hours. No results for input(s): "AMMONIA" in the last 168 hours. Coagulation Profile: No results for input(s): "INR", "PROTIME" in the last 168 hours. Cardiac Enzymes: No results for input(s): "CKTOTAL", "CKMB", "CKMBINDEX", "TROPONINI" in the last 168 hours.  BNP (last 3 results) No results for input(s): "PROBNP" in the last 8760 hours. HbA1C: No results for input(s): "HGBA1C" in the last 72 hours. CBG: No results for input(s): "GLUCAP" in the last 168 hours. Lipid Profile: No results for input(s): "CHOL", "HDL", "LDLCALC", "TRIG", "CHOLHDL", "LDLDIRECT" in the last 72 hours. Thyroid Function Tests: No results for input(s): "TSH", "T4TOTAL", "FREET4", "T3FREE", "THYROIDAB" in the last 72 hours. Anemia Panel: No results for input(s): "VITAMINB12", "FOLATE", "FERRITIN", "TIBC", "IRON", "RETICCTPCT" in the last 72 hours. Sepsis Labs: No results for input(s): "PROCALCITON", "LATICACIDVEN" in the last 168 hours.  No results found for this or any previous visit (from the past 240 hour(s)).       Radiology Studies: VAS [place] ABI WITH/WO  TBI  Result Date: 10/31/2022  LOWER EXTREMITY DOPPLER STUDY Patient Name:  Carmen Daniels  Date of Exam:   10/29/2022 Medical Rec #: [id number]       Accession #:    [id number] Date of Birth: 05/25/1985        Patient Gender: F Patient Age:   88 years Exam Location:  [facility] Procedure:      VAS [place] ABI WITH/WO TBI Referring Phys: MICHAEL JEFFERY --------------------------------------------------------------------------------  Indications: "numbness and tingling" High Risk Factors: Current smoker.  Comparison Study: No previous exams Performing Technologist: Hill, Jody RVT, RDMS  Examination Guidelines: A complete evaluation includes at minimum, Doppler waveform signals and systolic blood pressure reading at the level of bilateral brachial, anterior tibial, and posterior tibial arteries, when vessel segments are accessible. Bilateral testing is considered an integral part of a complete examination. Photoelectric Plethysmograph (PPG) waveforms and toe systolic pressure readings are included as required and additional duplex testing as needed. Limited examinations for reoccurring indications may be performed as noted.  ABI Findings: +--------+------------------+-----+---------+--------+ Right   Rt Pressure (mmHg)IndexWaveform Comment  +--------+------------------+-----+---------+--------+ [id number]                    triphasic         +--------+------------------+-----+---------+--------+ PTA     121  0.98 biphasic          +--------+------------------+-----+---------+--------+ DP      114               0.92 biphasic          +--------+------------------+-----+---------+--------+ +--------+------------------+-----+---------+-------+ Left    Lt Pressure (mmHg)IndexWaveform Comment +--------+------------------+-----+---------+-------+ Brachial                       triphasic        +--------+------------------+-----+---------+-------+ PTA     110                0.89 biphasic         +--------+------------------+-----+---------+-------+ DP      118               0.95 biphasic         +--------+------------------+-----+---------+-------+  Summary: Right: Resting right ankle-brachial index is within normal range. Left: Resting left ankle-brachial index is within normal range. *See table(s) above for measurements and observations.  Electronically signed by Heath Lark on 10/31/2022 at 11:03:34 AM.    Final         Scheduled Meds:  Chlorhexidine Gluconate Cloth  6 each Topical Q0600   vitamin B-12  500 mcg Oral Daily   DULoxetine  120 mg Oral Daily   enoxaparin (LOVENOX) injection  40 mg Subcutaneous Q24H   feeding supplement  237 mL Oral BID BM   gabapentin  300 mg Oral TID   lumateperone tosylate  42 mg Oral Daily   mupirocin ointment   Nasal BID   nicotine  21 mg Transdermal Daily   potassium chloride  40 mEq Oral Daily   saccharomyces boulardii  250 mg Oral BID   sodium chloride flush  10-40 mL Intracatheter Q12H   Continuous Infusions:  sodium chloride       LOS: 16 days    Time spent: 35 minutes    Anacristina Steffek A Fremon Zacharia, MD [facility]   If 7PM-7AM, please contact night-coverage [email or web address]  10/31/2022, 1:41 PM

## 2022-10-31 NOTE — Plan of Care (Signed)
?  Problem: Education: ?Goal: Knowledge of General Education information will improve ?Description: Including pain rating scale, medication(s)/side effects and non-pharmacologic comfort measures ?Outcome: Progressing ?  ?Problem: Health Behavior/Discharge Planning: ?Goal: Ability to manage health-related needs will improve ?Outcome: Progressing ?  ?Problem: Clinical Measurements: ?Goal: Ability to maintain clinical measurements within normal limits will improve ?Outcome: Progressing ?Goal: Will remain free from infection ?Outcome: Progressing ?Goal: Diagnostic test results will improve ?Outcome: Progressing ?Goal: Respiratory complications will improve ?Outcome: Progressing ?Goal: Cardiovascular complication will be avoided ?Outcome: Progressing ?  ?Problem: Activity: ?Goal: Risk for activity intolerance will decrease ?Outcome: Progressing ?  ?Problem: Coping: ?Goal: Level of anxiety will decrease ?Outcome: Progressing ?  ?Problem: Skin Integrity: ?Goal: Risk for impaired skin integrity will decrease ?Outcome: Progressing ?  ?

## 2022-11-01 ENCOUNTER — Inpatient Hospital Stay (HOSPITAL_COMMUNITY): Payer: MEDICAID

## 2022-11-01 DIAGNOSIS — M6282 Rhabdomyolysis: Secondary | ICD-10-CM | POA: Diagnosis not present

## 2022-11-01 DIAGNOSIS — R609 Edema, unspecified: Secondary | ICD-10-CM

## 2022-11-01 MED ORDER — LOPERAMIDE HCL 2 MG PO CAPS
2.0000 mg | ORAL_CAPSULE | Freq: Once | ORAL | Status: AC
  Administered 2022-11-01: 2 mg via ORAL
  Filled 2022-11-01: qty 1

## 2022-11-01 NOTE — Plan of Care (Signed)
  Problem: Activity: Goal: Risk for activity intolerance will decrease Outcome: Progressing   Problem: Pain Managment: Goal: General experience of comfort will improve Outcome: Progressing   

## 2022-11-01 NOTE — Progress Notes (Signed)
PROGRESS NOTE    Carmen Daniels  FAO:[id number] DOB: Feb 09, 1986 DOA: 10/14/2022 PCP: Salli Real, MD   Brief Narrative: 37 year old with medical history significant for bipolar, substance abuse disorder, prior overdose and questionable suicidal ideation at outside facility.  She presents to the ED after being found down by family.  Nearby there was noted  vomit on multiple medications.  Estimated downtime approximately 20 hours.  Patient was admitted with concern of overdose and subsequent withdrawal, hospitalist was called for admission.  On admission she was found to have acute toxic metabolic encephalopathy and severe rhabdomyolysis.  She was started on aggressive IV fluids.  Subsequently rhabdo resolved.  She was evaluated by psychiatry who initially recommended inpatient psych admission.  Reevaluated by psych on 9/3 patient does not require inpatient psych admission, they recommend home health PT or skilled rehab.  Completed 7 days of doxycycline for multiple sites of small areas of cellulitis.  There was concern for right lower extremity peroneal nerve palsy.  AFO boot ordered.   Assessment & Plan:   Principal Problem:   Rhabdomyolysis Active Problems:   AMS (altered mental status)   Non-traumatic rhabdomyolysis   Encephalopathy, toxic   Polysubstance abuse (HCC)   Severe bipolar I disorder, most recent episode depressed (HCC)   Other bipolar disorder (HCC)  1-Acute toxic encephalopathy, POA History of substance abuse disorder Concern for overdose, accidental versus intentional Toxic encephalopathy due to multidrug overdose. She has been  clear by psychiatric for outpatient care Back to baseline. Alert and conversant.    2-Rhabdomyolysis, nontraumatic CK level normalized.  Treated with IV fluids Resolved.   3-Right lower extremity erythema mild fluid-filled lesion improving Lower extremity palsy, right lower extremity peroneal palsy compression Patient with neuropathic pain,  continue with Cymbalta and Neurontin Completed  doxycycline for 7 days, will extend it for 3 more days. Has some redness near knee.  MRI of the Lumbar Spine showed Chronic Lumbar arthropathy She might benefit from outpatient EMG. Patient to use AFO She has been seen previously by Dr. Susa Simmonds for bimalleolar fracture December 2023. Appreciate ortho evaluation. ABI normal.   Tachycardia: resolved.  Continue with IV fluids D dimer elevated. CTA chest negative for PE, Doppler negative for DVT Report palpitation this am. EKG sinus.   Hypokalemia/hypophosphatemia/hypomagnesemia: Replaced  Lactic Acidosis: Resolved with fluids Diarrhea; started  florastore, bentyl PRN. Monitor for fever   Bipolar disorder, borderline personality disorder: Trazodone as needed, on Caply[place] Migraine: resume home imiprex Estimated body mass index is 24.38 kg/m as calculated from the following:   Height as of this encounter: 5\' 11"  (1.803 m).   Weight as of this encounter: 79.3 kg.   DVT prophylaxis: Lovenox Code Status: Full code Family Communication: Care discussed with patient  Disposition Plan:  Status is: Inpatient Remains inpatient appropriate because: medical stable, awaiting safe discharge    Consultants:  psych  Procedures:  none  Antimicrobials:    Subjective: Report palpitation.  Foot pain   Objective: Vitals:   10/31/22 1244 10/31/22 1925 11/01/22 0400 11/01/22 0923  BP: 128/85 125/76 131/85 114/72  Pulse: (!) 115 90 (!) 103 99  Resp: 16 17 17 16   Temp: 98.4 F (36.9 C) 98.5 F (36.9 C) 98.2 F (36.8 C) 98.4 F (36.9 C)  TempSrc: Oral Oral  Oral  SpO2: 98% 100% 100% 100%  Weight:      Height:        Intake/Output Summary (Last 24 hours) at 11/01/2022 1348 Last data filed at 11/01/2022 0745  Gross per 24 hour  Intake 240 ml  Output --  Net 240 ml    Filed Weights   10/15/22 0223  Weight: 79.3 kg    Examination:  General exam: NAD Respiratory system:  CTA Cardiovascular system: S 1, S 2 RRR Gastrointestinal system: BS present, soft, nt Central nervous system:alert Extremities: right foot drop, mild redness.  Data Reviewed: I have personally reviewed following labs and imaging studies  CBC: Recent Labs  Lab 10/26/22 0936 10/30/22 0713  WBC 9.6 8.4  NEUTROABS 7.3  --   HGB 14.0 14.2  HCT 40.7 41.9  MCV 85.9 87.3  PLT 404* 371   Basic Metabolic Panel: Recent Labs  Lab 10/26/22 0936 10/30/22 0713  NA 135 137  K 3.9 3.6  CL 101 101  CO2 24 23  GLUCOSE 113* 114*  BUN 5* 9  CREATININE 0.62 0.71  CALCIUM 9.3 9.1   GFR: Estimated Creatinine Clearance: 106.6 mL/min (by C-G formula based on SCr of 0.71 mg/dL). Liver Function Tests: No results for input(s): "AST", "ALT", "ALKPHOS", "BILITOT", "PROT", "ALBUMIN" in the last 168 hours.  No results for input(s): "LIPASE", "AMYLASE" in the last 168 hours. No results for input(s): "AMMONIA" in the last 168 hours. Coagulation Profile: No results for input(s): "INR", "PROTIME" in the last 168 hours. Cardiac Enzymes: No results for input(s): "CKTOTAL", "CKMB", "CKMBINDEX", "TROPONINI" in the last 168 hours.  BNP (last 3 results) No results for input(s): "PROBNP" in the last 8760 hours. HbA1C: No results for input(s): "HGBA1C" in the last 72 hours. CBG: No results for input(s): "GLUCAP" in the last 168 hours. Lipid Profile: No results for input(s): "CHOL", "HDL", "LDLCALC", "TRIG", "CHOLHDL", "LDLDIRECT" in the last 72 hours. Thyroid Function Tests: No results for input(s): "TSH", "T4TOTAL", "FREET4", "T3FREE", "THYROIDAB" in the last 72 hours. Anemia Panel: No results for input(s): "VITAMINB12", "FOLATE", "FERRITIN", "TIBC", "IRON", "RETICCTPCT" in the last 72 hours. Sepsis Labs: No results for input(s): "PROCALCITON", "LATICACIDVEN" in the last 168 hours.  No results found for this or any previous visit (from the past 240 hour(s)).       Radiology Studies: VAS [place]  LOWER EXTREMITY VENOUS (DVT)  Result Date: 11/01/2022  Lower Venous DVT Study Patient Name:  Carmen Daniels  Date of Exam:   11/01/2022 Medical Rec #: [id number]       Accession #:    [id number] Date of Birth: 01-31-1986        Patient Gender: F Patient Age:   37 years Exam Location:  [facility] Procedure:      VAS [place] LOWER EXTREMITY VENOUS (DVT) Referring Phys: Carmen Daniels --------------------------------------------------------------------------------  Indications: Edema.  Limitations: Poor ultrasound/tissue interface. Comparison Study: No significant changes seen since previous exam 07/03/20 Performing Technologist: Shona Simpson  Examination Guidelines: A complete evaluation includes B-mode imaging, spectral Doppler, color Doppler, and power Doppler as needed of all accessible portions of each vessel. Bilateral testing is considered an integral part of a complete examination. Limited examinations for reoccurring indications may be performed as noted. The reflux portion of the exam is performed with the patient in reverse Trendelenburg.  +---------+---------------+---------+-----------+----------+--------------+ RIGHT    CompressibilityPhasicitySpontaneityPropertiesThrombus Aging +---------+---------------+---------+-----------+----------+--------------+ CFV      Full           Yes      Yes                                 +---------+---------------+---------+-----------+----------+--------------+  SFJ      Full                                                        +---------+---------------+---------+-----------+----------+--------------+ FV Prox  Full                                                        +---------+---------------+---------+-----------+----------+--------------+ FV Mid   Full                                                        +---------+---------------+---------+-----------+----------+--------------+ FV DistalFull                                                         +---------+---------------+---------+-----------+----------+--------------+ PFV      Full                                                        +---------+---------------+---------+-----------+----------+--------------+ POP      Full           Yes      Yes                                 +---------+---------------+---------+-----------+----------+--------------+ PTV      Full                                                        +---------+---------------+---------+-----------+----------+--------------+ PERO     Full                                                        +---------+---------------+---------+-----------+----------+--------------+   +----+---------------+---------+-----------+----------+--------------+ LEFTCompressibilityPhasicitySpontaneityPropertiesThrombus Aging +----+---------------+---------+-----------+----------+--------------+ CFV Full           Yes      Yes                                 +----+---------------+---------+-----------+----------+--------------+     Summary: RIGHT: - There is no evidence of deep vein thrombosis in the lower extremity.  - No cystic structure found in the popliteal fossa.  LEFT: - No evidence of common femoral vein obstruction.   *See table(s) above for measurements and observations.    Preliminary  CT Angio Chest Pulmonary Embolism (PE) W or WO Contrast  Result Date: 10/31/2022 CLINICAL DATA:  Tachycardia EXAM: CT ANGIOGRAPHY CHEST WITH CONTRAST TECHNIQUE: Multidetector CT imaging of the chest was performed using the standard protocol during bolus administration of intravenous contrast. Multiplanar CT image reconstructions and MIPs were obtained to evaluate the vascular anatomy. RADIATION DOSE REDUCTION: This exam was performed according to the departmental dose-optimization program which includes automated exposure control, adjustment of the mA and/or kV according to patient size and/or  use of iterative reconstruction technique. CONTRAST:  75mL OMNIPAQUE IOHEXOL 350 MG/ML SOLN COMPARISON:  CTA chest dated January 20, 2020 FINDINGS: Cardiovascular: Normal heart size. No pericardial effusion. Normal caliber thoracic aorta no atherosclerotic disease. No evidence of pulmonary embolus. Mediastinum/Nodes: Esophagus is unremarkable. Thyroid is unremarkable. No enlarged lymph nodes seen in the chest. Lungs/Pleura: Central airways are patent. No consolidation, pleural effusion or pneumothorax. Upper Abdomen: No acute abnormality. Musculoskeletal: No chest wall abnormality. No acute or significant osseous findings. Review of the MIP images confirms the above findings. IMPRESSION: 1. No evidence of pulmonary embolus. 2. No acute findings in the chest. Electronically Signed   By: Allegra Lai M.D.   On: 10/31/2022 15:23        Scheduled Meds:  Chlorhexidine Gluconate Cloth  6 each Topical Q0600   vitamin B-12  500 mcg Oral Daily   doxycycline  100 mg Oral Q12H   DULoxetine  120 mg Oral Daily   enoxaparin (LOVENOX) injection  40 mg Subcutaneous Q24H   feeding supplement  237 mL Oral BID BM   gabapentin  300 mg Oral TID   lumateperone tosylate  42 mg Oral Daily   mupirocin ointment   Nasal BID   nicotine  21 mg Transdermal Daily   potassium chloride  40 mEq Oral Daily   saccharomyces boulardii  250 mg Oral BID   sodium chloride flush  10-40 mL Intracatheter Q12H   Continuous Infusions:  sodium chloride 100 mL/hr at 10/31/22 2152     LOS: 17 days    Time spent: 35 minutes    Erasmus Bistline A Zarina Pe, MD [facility]   If 7PM-7AM, please contact night-coverage [email or web address]  11/01/2022, 1:48 PM

## 2022-11-01 NOTE — Telephone Encounter (Signed)
Pt has new [employer], completed PA form and placed in nurse pod for MD signature.

## 2022-11-01 NOTE — Progress Notes (Signed)
Lower extremity venous duplex completed. Please see CV Procedures for preliminary results.  Shona Simpson, RVT 11/01/22 11:07 AM

## 2022-11-01 NOTE — Progress Notes (Signed)
Mobility Specialist: Progress Note   11/01/22 1043  Mobility  Activity Ambulated with assistance in hallway  Level of Assistance Standby assist, set-up cues, supervision of patient - no hands on  Assistive Device Front wheel walker (with w/c follow)  Distance Ambulated (ft) 65 ft  Activity Response Tolerated well  Mobility Referral Yes  $Mobility charge 1 Mobility  Mobility Specialist Start Time (ACUTE ONLY) 1011  Mobility Specialist Stop Time (ACUTE ONLY) 1022  Mobility Specialist Time Calculation (min) (ACUTE ONLY) 11 min    Pt was agreeable to mobility session - received in bed. Took 2x seated breaks (~30 secs) during ambulation d/t dizziness and ear ringing. Able to complete return ambulation without breaks or fault. Left in bed with all needs met, call bell in reach.   Maurene Capes Mobility Specialist Please contact via SecureChat or Rehab office at [phone]

## 2022-11-01 NOTE — Progress Notes (Signed)
Physical Therapy Treatment Patient Details Name: Carmen Daniels MRN: [id number] DOB: 1985/11/25 Today's Date: 11/01/2022   History of Present Illness Pt is 37 yo presenting [facility] ED [facility]/22 after being found down by family near vomit and multiple medications. Pt was found to have acute toxic metabolic encephalopathy and severe rhabdomyolysis. PMH: bipolar, substance abuse disorder, fibromyalgia, schizoaffective disorder, BPD, asthma, OCD, prior overdose and questionable suicidal ideation at outside facility.    PT Comments  Pt with improved ambulation and wheelchair management today especially with encouragement from friend present today. Pt self limiting as likely could be independent at a wheelchair level as she quickly gives up on tasks like leg rest management.  Impatient drug rehab would be best possible discharge plan given limited other options available. Pt currently ambulates and manages wheelchair at a supervision level.    If plan is discharge home, recommend the following: Help with stairs or ramp for entrance;A little help with walking and/or transfers;Assistance with cooking/housework;Assist for transportation   Can travel by private vehicle      Yes  Equipment Recommendations  None recommended by PT (Friend states today that she can grab needed DME from house.)       Precautions / Restrictions Precautions Precautions: Fall Precaution Comments: R drop foot Restrictions Weight Bearing Restrictions: No     Mobility  Bed Mobility Overal bed mobility: Independent                  Transfers Overall transfer level: Needs assistance Equipment used: Rolling walker (2 wheels) Transfers: Sit to/from Stand, Bed to chair/wheelchair/BSC Sit to Stand: Supervision           General transfer comment: able to power up from low bed surface and wheelchair surface    Ambulation/Gait Ambulation/Gait assistance: Supervision Gait Distance (Feet): 20 Feet (+25) Assistive  device: Rolling walker (2 wheels) Gait Pattern/deviations: Step-to pattern, Decreased weight shift to right, Decreased step length - left Gait velocity: decreased Gait velocity interpretation: 1.31 - 2.62 ft/sec, indicative of limited community ambulator   General Gait Details: able to progresss mobility distance, has her personal walker which she reports feeling more confident in, continues to report dizziness and ringing in ears, but will ambulate further with encouragement before needing to sit and rest until dizziness stops. pt with improved R foot clearance with mobility today       [employer]chant [employer] officer mobility: Yes Wheelchair propulsion: Both upper extremities Wheelchair parts: Supervision/cueing Distance: 200 Wheelchair Assistance Details (indicate cue type and reason): continues to need cuing for management of legrests limited attempt at problem solving       Balance Overall balance assessment: Needs assistance Sitting-balance support: No upper extremity supported, Feet supported Sitting balance-Leahy Scale: Good Sitting balance - Comments: able to perform dressing seated on EOB   Standing balance support: Bilateral upper extremity supported, During functional activity Standing balance-Leahy Scale: Fair Standing balance comment: Pt requires UE support for functional mobility due to R foot deficits                            Cognition Arousal: Alert Behavior During Therapy: WFL for tasks assessed/performed Overall Cognitive Status: Within Functional Limits for tasks assessed                                 General Comments: continues to be very flat, friend present  whose encouragement decreases self limiting behaviors           General Comments General comments (skin integrity, edema, etc.): VSS, friend present providing encouragement, agreeable to drinking Ensure at end of session to improve protein intake       Pertinent Vitals/Pain Pain Assessment Pain Assessment: Faces Faces Pain Scale: Hurts little more Breathing: normal Facial Expression: smiling or inexpressive Body Language: relaxed Consolability: no need to console Pain Location: R foot Pain Descriptors / Indicators: Burning, Discomfort, Grimacing Pain Intervention(s): Limited activity within patient's tolerance, Monitored during session, Repositioned     PT Goals (current goals can now be found in the care plan section) Acute Rehab PT Goals Patient Stated Goal: to go to rehab PT Goal Formulation: With patient Time For Goal Achievement: 11/04/22 Potential to Achieve Goals: Good Progress towards PT goals: Progressing toward goals    Frequency    Min 1X/week       AM-PAC PT "6 Clicks" Mobility   Outcome Measure  Help needed turning from your back to your side while in a flat bed without using bedrails?: None Help needed moving from lying on your back to sitting on the side of a flat bed without using bedrails?: None Help needed moving to and from a bed to a chair (including a wheelchair)?: A Little Help needed standing up from a chair using your arms (e.g., wheelchair or bedside chair)?: A Little Help needed to walk in hospital room?: A Little Help needed climbing 3-5 steps with a railing? : A Lot 6 Click Score: 19    End of Session Equipment Utilized During Treatment: Other (comment) (CAM boot) Activity Tolerance: Patient tolerated treatment well Patient left: with call bell/phone within reach;in chair;with chair alarm set Nurse Communication: Mobility status PT Visit Diagnosis: Unsteadiness on feet (R26.81);Other abnormalities of gait and mobility (R26.89)     Time: 9629-5284 PT Time Calculation (min) (ACUTE ONLY): 35 min  Charges:    $Gait Training: 8-22 mins $Wheel Chair Management: 8-22 mins PT General Charges $$ ACUTE PT VISIT: 1 Visit                     Carmen Daniels B. Beverely Risen PT, DPT Acute  Rehabilitation Services Please use secure chat or  Call Office [phone]    Carmen Daniels [facility] 11/01/2022, 4:49 PM

## 2022-11-02 DIAGNOSIS — F1910 Other psychoactive substance abuse, uncomplicated: Secondary | ICD-10-CM | POA: Diagnosis not present

## 2022-11-02 DIAGNOSIS — F3189 Other bipolar disorder: Secondary | ICD-10-CM | POA: Diagnosis not present

## 2022-11-02 MED ORDER — LOPERAMIDE HCL 2 MG PO CAPS
2.0000 mg | ORAL_CAPSULE | Freq: Once | ORAL | Status: AC
  Administered 2022-11-02: 2 mg via ORAL
  Filled 2022-11-02: qty 1

## 2022-11-02 NOTE — Plan of Care (Signed)
  Problem: Clinical Measurements: Goal: Will remain free from infection Outcome: Progressing Goal: Diagnostic test results will improve Outcome: Progressing Goal: Respiratory complications will improve Outcome: Progressing Goal: Cardiovascular complication will be avoided Outcome: Progressing   Problem: Activity: Goal: Risk for activity intolerance will decrease Outcome: Progressing   

## 2022-11-02 NOTE — Progress Notes (Signed)
Occupational Therapy Treatment Patient Details Name: Carmen Daniels MRN: [id number] DOB: 03-06-1985 Today's Date: 11/02/2022   History of present illness Pt is 37 yo presenting to [facility] ED 8/22 after being found down by family near vomit and multiple medications. Pt was found to have acute toxic metabolic encephalopathy and severe rhabdomyolysis. PMH: bipolar, substance abuse disorder, fibromyalgia, schizoaffective disorder, BPD, asthma, OCD, prior overdose and questionable suicidal ideation at outside facility.   OT comments  Patient making progress toward OT goals. Patient completed bed mobility Independently and completed sit to stand and functional mobility from bed into room using RW with Supervision.  Patient transferred to shower chair and completed UB/LB bathing at seated level with lateral shifts for peri hygiene.  Patient completed sit to stand from [facility] with CGA 2/2 low height but was able to power up on 2nd attempt.  Patient completed functional mobility back to EOB and completed UB dressing Independently while seated EOB and Supervision for LB dressing, patient threading garment onto Les and then completing sit to supine to don pants from thighs to waist.  Patient left with all needs within reach.      If plan is discharge home, recommend the following:  A little help with walking and/or transfers;A little help with bathing/dressing/bathroom   Equipment Recommendations  None recommended by OT    Recommendations for Other Services      Precautions / Restrictions Precautions Precautions: Fall Precaution Comments: R drop foot Required Braces or Orthoses:  (CAM for foot drop) Restrictions Weight Bearing Restrictions: No       Mobility Bed Mobility Overal bed mobility: Independent                  Transfers Overall transfer level: Needs assistance Equipment used: Rolling walker (2 wheels) Transfers: Sit to/from Stand Sit to Stand: Supervision Stand pivot transfers:  Supervision               Balance       Sitting balance - Comments: able to perform dressing seated on EOB (to thread onto LEs and then completed sit to supine to don from thigh level to waist- bed level with Supervision)   Standing balance support: Bilateral upper extremity supported, During functional activity   Standing balance comment: Pt requires UE support for functional mobility due to R foot deficits                           ADL either performed or assessed with clinical judgement   ADL Overall ADL's : Needs assistance/impaired Eating/Feeding: Independent;Sitting       Upper Body Bathing: Independent;Sitting   Lower Body Bathing: Independent (sitting on shower seat in shower)   Upper Body Dressing : Set up;Sitting Upper Body Dressing Details (indicate cue type and reason): donned T-shirt Lower Body Dressing: Supervision/safety;Set up (threased pants onto LEs and then completed supine dressing to don pants from thighs to waist level) Lower Body Dressing Details (indicate cue type and reason):  (Donned CAM boot while seated EOB,  able to thread LEs into pants with Supervision.)         Tub/ Shower Transfer: Walk-in shower;Shower seat;Rolling walker (2 wheels);Modified independent (with rw)   Functional mobility during ADLs: Supervision/safety General ADL Comments: Patient completed functional mobility with RW from EOB to shower seat in bathroom with Supervision.  Tranfer to shower seated completed with Supervision.    Extremity/Trunk Assessment Upper Extremity Assessment Upper Extremity Assessment: Overall [facility]  for tasks assessed            Vision       Perception     Praxis      Cognition Arousal: Alert Behavior During Therapy: [facility] for tasks assessed/performed Overall Cognitive Status: Within Functional Limits for tasks assessed                                          Exercises      Shoulder Instructions        General Comments      Pertinent Vitals/ Pain       Pain Assessment Pain Assessment: No/denies pain Pain Score: 2  Faces Pain Scale: Hurts a little bit Pain Location: R foot Pain Descriptors / Indicators: Burning, Discomfort, Grimacing Pain Intervention(s): Monitored during session  Home Living                                          Prior Functioning/Environment              Frequency  Min 1X/week        Progress Toward Goals  OT Goals(current goals can now be found in the care plan section)  Progress towards OT goals: Progressing toward goals  Acute Rehab OT Goals OT Goal Formulation: With patient Time For Goal Achievement: 11/04/22 Potential to Achieve Goals: Good ADL Goals Pt Will Perform Lower Body Bathing: Independently Pt Will Perform Lower Body Dressing: Independently Pt Will Transfer to Toilet: Independently Pt Will Perform Toileting - Clothing Manipulation and hygiene: Independently  Plan      Co-evaluation                 AM-PAC OT "6 Clicks" Daily Activity     Outcome Measure   Help from another person eating meals?: None Help from another person taking care of personal grooming?: None Help from another person toileting, which includes using toliet, bedpan, or urinal?: A Little Help from another person bathing (including washing, rinsing, drying)?: A Little Help from another person to put on and taking off regular upper body clothing?: None Help from another person to put on and taking off regular lower body clothing?: None 6 Click Score: 22    End of Session Equipment Utilized During Treatment: Rolling walker (2 wheels)  OT Visit Diagnosis: Unsteadiness on feet (R26.81);Muscle weakness (generalized) (M62.81)   Activity Tolerance Patient tolerated treatment well   Patient Left in bed;with call bell/phone within reach;with family/visitor present;with bed alarm set   Nurse Communication Mobility status         Time: 3220-2542 OT Time Calculation (min): 52 min  Charges: OT General Charges $OT Visit: 1 Visit OT Treatments $Self Care/Home Management : 38-52 mins Governor Specking OT/L Denice Paradise 11/02/2022, 3:47 PM

## 2022-11-02 NOTE — TOC Progression Note (Addendum)
Transition of Care [facility]) - Progression Note    Patient Details  Name: Carmen Daniels MRN: [id number] Date of Birth: Sep 18, 1985  [facility]) CM/SW Contact  Epifanio Lesches, RN Phone Number: 11/02/2022, 11:56 AM  Clinical Narrative:    Pt without SNF bed offers. Pt homeless. Substance abuse hx. Pt with right lower extremity peroneal nerve palsy.  Voice message left with [facility] case manger Arvil Persons  [phone]) for update on options for SNF placement. [phone]. Gilman Buttner still pending.  Pt states will f/u with [facility] for assessment /screening today.   TOC team following and will assist with needs.  Expected Discharge Plan: Skilled Nursing Facility Barriers to Discharge: Other (must enter comment) (no bed offer)  Expected Discharge Plan and Services In-house Referral: Clinical Social Work   Post Acute Care Choice: IP Rehab Living arrangements for the past 2 months: Single Family Home Expected Discharge Date: 10/22/22                                     Social Determinants of Health (SDOH) Interventions SDOH Screenings   Food Insecurity: No Food Insecurity (10/15/2022)  Housing: Low Risk  (10/15/2022)  Transportation Needs: No Transportation Needs (10/15/2022)  Utilities: Not At Risk (10/15/2022)  Alcohol Screen: Low Risk  (04/10/2017)  Depression (PHQ2-9): High Risk (08/04/2022)  Financial Resource Strain: Medium Risk (05/07/2022)   Received from [facility], [facility]  Social Connections: Unknown (06/24/2021)   Received from [facility], [facility]  Stress: Stress Concern Present (05/07/2022)   Received from [facility], [facility]  Tobacco Use: High Risk (10/15/2022)    Readmission Risk Interventions     No data to display

## 2022-11-02 NOTE — Progress Notes (Signed)
PROGRESS NOTE    Carmen Daniels  FAO:[id number] DOB: 09/20/1985 DOA: 10/14/2022 PCP: Salli Real, MD   Brief Narrative: 37 year old with medical history significant for bipolar, substance abuse disorder, prior overdose and questionable suicidal ideation at outside facility.  She presents to the ED after being found down by family.  Nearby there was noted  vomit and  multiple medications.  Estimated downtime approximately 20 hours.  Patient was admitted with concern of overdose and subsequent withdrawal, hospitalist was called for admission.  On admission she was found to have acute toxic metabolic encephalopathy and severe rhabdomyolysis.  She was started on aggressive IV fluids.  Subsequently rhabdo resolved.  She was evaluated by psychiatry who initially recommended inpatient psych admission.  Reevaluated by psych on 9/3 patient does not require inpatient psych admission, they recommend home health PT or skilled rehab.  She will complete 10  days of doxycycline for multiple sites of small areas of cellulitis.  There was concern for right lower extremity peroneal nerve palsy.  AFO boot ordered.  Awaiting safe disposition.    Assessment & Plan:   Principal Problem:   Rhabdomyolysis Active Problems:   AMS (altered mental status)   Non-traumatic rhabdomyolysis   Encephalopathy, toxic   Polysubstance abuse (HCC)   Severe bipolar I disorder, most recent episode depressed (HCC)   Other bipolar disorder (HCC)  1-Acute toxic encephalopathy, POA History of substance abuse disorder Concern for overdose, accidental versus intentional Toxic encephalopathy due to multidrug overdose. She has been  clear by psychiatric for outpatient care Back to baseline. Alert and conversant.    2-Rhabdomyolysis, nontraumatic CK level normalized.  Treated with IV fluids Resolved.   3-Right lower extremity erythema mild fluid-filled lesion improving Lower extremity palsy, right lower extremity peroneal palsy  compression Patient with neuropathic pain, continue with Cymbalta and Neurontin Plan to complete 10 days of doxy for multiples sites cellulitis.  MRI of the Lumbar Spine showed Chronic Lumbar arthropathy She might benefit from outpatient EMG. Patient to use AFO She has been seen previously by Dr. Susa Simmonds for bimalleolar fracture December 2023. Appreciate ortho evaluation. ABI normal.  Needs to work with PT>   Tachycardia: resolved.  Continue with IV fluids D dimer elevated. CTA chest negative for PE, Doppler negative for DVT Report palpitation 9/09, EKG sinus.   Hypokalemia/hypophosphatemia/hypomagnesemia: Replaced  Lactic Acidosis: Resolved with fluids Diarrhea; started  florastore, bentyl PRN. Monitor for fever   Bipolar disorder, borderline personality disorder: Trazodone as needed, on Caplyta Migraine: resume home imiprex Estimated body mass index is 24.38 kg/m as calculated from the following:   Height as of this encounter: 5\' 11"  (1.803 m).   Weight as of this encounter: 79.3 kg.   DVT prophylaxis: Lovenox Code Status: Full code Family Communication: Care discussed with patient  Disposition Plan:  Status is: Inpatient Remains inpatient appropriate because: medical stable, awaiting safe discharge    Consultants:  psych  Procedures:  none  Antimicrobials:    Subjective: No complaints today.    Objective: Vitals:   11/01/22 1423 11/01/22 1917 11/02/22 0408 11/02/22 1300  BP: 125/84 117/68 131/81 120/82  Pulse: (!) 108 77 81 98  Resp: 20 17 17 17   Temp: 98.5 F (36.9 C)  98.1 F (36.7 C) 98.9 F (37.2 C)  TempSrc: Oral   Oral  SpO2: 100% 100% 100% 100%  Weight:      Height:       No intake or output data in the 24 hours ending 11/02/22 1428  Filed Weights   10/15/22 0223  Weight: 79.3 kg    Examination:  General exam: NAD Respiratory system: CTA Cardiovascular system: S 1, S 2 RRR Gastrointestinal system: BS present, soft, nt Central  nervous system: Alert Extremities: right foot drop, mild redness.  Data Reviewed: I have personally reviewed following labs and imaging studies  CBC: Recent Labs  Lab 10/30/22 0713  WBC 8.4  HGB 14.2  HCT 41.9  MCV 87.3  PLT 371   Basic Metabolic Panel: Recent Labs  Lab 10/30/22 0713  NA 137  K 3.6  CL 101  CO2 23  GLUCOSE 114*  BUN 9  CREATININE 0.71  CALCIUM 9.1   GFR: Estimated Creatinine Clearance: 106.6 mL/min (by C-G formula based on SCr of 0.71 mg/dL). Liver Function Tests: No results for input(s): "AST", "ALT", "ALKPHOS", "BILITOT", "PROT", "ALBUMIN" in the last 168 hours.  No results for input(s): "LIPASE", "AMYLASE" in the last 168 hours. No results for input(s): "AMMONIA" in the last 168 hours. Coagulation Profile: No results for input(s): "INR", "PROTIME" in the last 168 hours. Cardiac Enzymes: No results for input(s): "CKTOTAL", "CKMB", "CKMBINDEX", "TROPONINI" in the last 168 hours.  BNP (last 3 results) No results for input(s): "PROBNP" in the last 8760 hours. HbA1C: No results for input(s): "HGBA1C" in the last 72 hours. CBG: No results for input(s): "GLUCAP" in the last 168 hours. Lipid Profile: No results for input(s): "CHOL", "HDL", "LDLCALC", "TRIG", "CHOLHDL", "LDLDIRECT" in the last 72 hours. Thyroid Function Tests: No results for input(s): "TSH", "T4TOTAL", "FREET4", "T3FREE", "THYROIDAB" in the last 72 hours. Anemia Panel: No results for input(s): "VITAMINB12", "FOLATE", "FERRITIN", "TIBC", "IRON", "RETICCTPCT" in the last 72 hours. Sepsis Labs: No results for input(s): "PROCALCITON", "LATICACIDVEN" in the last 168 hours.  No results found for this or any previous visit (from the past 240 hour(s)).       Radiology Studies: VAS [place] LOWER EXTREMITY VENOUS (DVT)  Result Date: 11/01/2022  Lower Venous DVT Study Patient Name:  Carmen Daniels  Date of Exam:   11/01/2022 Medical Rec #: [id number]       Accession #:    [id number] Date of  Birth: 03-21-1985        Patient Gender: F Patient Age:   37 years Exam Location:  [facility] Procedure:      VAS [place] LOWER EXTREMITY VENOUS (DVT) Referring Phys: Jon Billings Castor Gittleman --------------------------------------------------------------------------------  Indications: Edema.  Limitations: Poor ultrasound/tissue interface. Comparison Study: No significant changes seen since previous exam 07/03/20 Performing Technologist: Shona Simpson  Examination Guidelines: A complete evaluation includes B-mode imaging, spectral Doppler, color Doppler, and power Doppler as needed of all accessible portions of each vessel. Bilateral testing is considered an integral part of a complete examination. Limited examinations for reoccurring indications may be performed as noted. The reflux portion of the exam is performed with the patient in reverse Trendelenburg.  +---------+---------------+---------+-----------+----------+--------------+ RIGHT    CompressibilityPhasicitySpontaneityPropertiesThrombus Aging +---------+---------------+---------+-----------+----------+--------------+ CFV      Full           Yes      Yes                                 +---------+---------------+---------+-----------+----------+--------------+ SFJ      Full                                                        +---------+---------------+---------+-----------+----------+--------------+  FV Prox  Full                                                        +---------+---------------+---------+-----------+----------+--------------+ FV Mid   Full                                                        +---------+---------------+---------+-----------+----------+--------------+ FV DistalFull                                                        +---------+---------------+---------+-----------+----------+--------------+ PFV      Full                                                         +---------+---------------+---------+-----------+----------+--------------+ POP      Full           Yes      Yes                                 +---------+---------------+---------+-----------+----------+--------------+ PTV      Full                                                        +---------+---------------+---------+-----------+----------+--------------+ PERO     Full                                                        +---------+---------------+---------+-----------+----------+--------------+   +----+---------------+---------+-----------+----------+--------------+ LEFTCompressibilityPhasicitySpontaneityPropertiesThrombus Aging +----+---------------+---------+-----------+----------+--------------+ CFV Full           Yes      Yes                                 +----+---------------+---------+-----------+----------+--------------+     Summary: RIGHT: - There is no evidence of deep vein thrombosis in the lower extremity.  - No cystic structure found in the popliteal fossa.  LEFT: - No evidence of common femoral vein obstruction.   *See table(s) above for measurements and observations. Electronically signed by Sherald Hess MD on 11/01/2022 at 4:04:41 PM.    Final    CT Angio Chest Pulmonary Embolism (PE) W or WO Contrast  Result Date: 10/31/2022 CLINICAL DATA:  Tachycardia EXAM: CT ANGIOGRAPHY CHEST WITH CONTRAST TECHNIQUE: Multidetector CT imaging of the chest was performed using the standard protocol during bolus administration of intravenous contrast. Multiplanar CT image reconstructions and MIPs were obtained  to evaluate the vascular anatomy. RADIATION DOSE REDUCTION: This exam was performed according to the departmental dose-optimization program which includes automated exposure control, adjustment of the mA and/or kV according to patient size and/or use of iterative reconstruction technique. CONTRAST:  75mL OMNIPAQUE IOHEXOL 350 MG/ML SOLN COMPARISON:  CTA  chest dated January 20, 2020 FINDINGS: Cardiovascular: Normal heart size. No pericardial effusion. Normal caliber thoracic aorta no atherosclerotic disease. No evidence of pulmonary embolus. Mediastinum/Nodes: Esophagus is unremarkable. Thyroid is unremarkable. No enlarged lymph nodes seen in the chest. Lungs/Pleura: Central airways are patent. No consolidation, pleural effusion or pneumothorax. Upper Abdomen: No acute abnormality. Musculoskeletal: No chest wall abnormality. No acute or significant osseous findings. Review of the MIP images confirms the above findings. IMPRESSION: 1. No evidence of pulmonary embolus. 2. No acute findings in the chest. Electronically Signed   By: Allegra Lai M.D.   On: 10/31/2022 15:23        Scheduled Meds:  Chlorhexidine Gluconate Cloth  6 each Topical Q0600   vitamin B-12  500 mcg Oral Daily   doxycycline  100 mg Oral Q12H   DULoxetine  120 mg Oral Daily   enoxaparin (LOVENOX) injection  40 mg Subcutaneous Q24H   feeding supplement  237 mL Oral BID BM   gabapentin  300 mg Oral TID   lumateperone tosylate  42 mg Oral Daily   mupirocin ointment   Nasal BID   nicotine  21 mg Transdermal Daily   potassium chloride  40 mEq Oral Daily   saccharomyces boulardii  250 mg Oral BID   sodium chloride flush  10-40 mL Intracatheter Q12H   Continuous Infusions:     LOS: 18 days    Time spent: 35 minutes    Alexzandria Massman A Jacquita Mulhearn, MD [facility]   If 7PM-7AM, please contact night-coverage [email or web address]  11/02/2022, 2:28 PM

## 2022-11-03 DIAGNOSIS — F314 Bipolar disorder, current episode depressed, severe, without psychotic features: Secondary | ICD-10-CM | POA: Diagnosis not present

## 2022-11-03 MED ORDER — CHLORHEXIDINE GLUCONATE CLOTH 2 % EX PADS
6.0000 | MEDICATED_PAD | Freq: Every day | CUTANEOUS | Status: DC
  Administered 2022-11-04 – 2022-11-07 (×3): 6 via TOPICAL

## 2022-11-03 NOTE — Telephone Encounter (Signed)
Received fax that original auth from regular Medicaid plan transferred over to [facility], good thru 03/31/23.

## 2022-11-03 NOTE — Plan of Care (Signed)
°  Problem: Clinical Measurements: °Goal: Diagnostic test results will improve °Outcome: Progressing °Goal: Respiratory complications will improve °Outcome: Progressing °Goal: Cardiovascular complication will be avoided °Outcome: Progressing °  °Problem: Activity: °Goal: Risk for activity intolerance will decrease °Outcome: Progressing °  °Problem: Nutrition: °Goal: Adequate nutrition will be maintained °Outcome: Progressing °  °Problem: Coping: °Goal: Level of anxiety will decrease °Outcome: Progressing °  °Problem: Elimination: °Goal: Will not experience complications related to bowel motility °Outcome: Progressing °Goal: Will not experience complications related to urinary retention °Outcome: Progressing °  °Problem: Pain Managment: °Goal: General experience of comfort will improve °Outcome: Progressing °  °Problem: Safety: °Goal: Ability to remain free from injury will improve °Outcome: Progressing °  °Problem: Skin Integrity: °Goal: Risk for impaired skin integrity will decrease °Outcome: Progressing °  °

## 2022-11-03 NOTE — TOC Progression Note (Signed)
Transition of Care [facility]) - Progression Note    Patient Details  Name: Carmen Daniels MRN: [id number] Date of Birth: 1985/10/08  Transition of Care [facility]) CM/SW Contact  Lorri Frederick, LCSW Phone Number: 11/03/2022, 3:16 PM  Clinical Narrative:   CSW spoke with Lelon Mast at [facility], who reports they still have not receive call from pt to complete phone screening.  CSW phone was brought to pt who did complete phone screening at this time.  CSW faxed updated PT/OT notes to [facility].  No will review for possible admission.  No beds available today.  1500: CSW spoke with Donna/[facility].  They still have pt referral, can review again for potential admission with new PT/OT notes, which were faxed to her.      Expected Discharge Plan: Skilled Nursing Facility Barriers to Discharge: Other (must enter comment) (no bed offer)  Expected Discharge Plan and Services In-house Referral: Clinical Social Work   Post Acute Care Choice: IP Rehab Living arrangements for the past 2 months: Single Family Home Expected Discharge Date: 10/22/22                                     Social Determinants of Health (SDOH) Interventions SDOH Screenings   Food Insecurity: No Food Insecurity (10/15/2022)  Housing: Low Risk  (10/15/2022)  Transportation Needs: No Transportation Needs (10/15/2022)  Utilities: Not At Risk (10/15/2022)  Alcohol Screen: Low Risk  (04/10/2017)  Depression (PHQ2-9): High Risk (08/04/2022)  Financial Resource Strain: Medium Risk (05/07/2022)   Received [facility]  Social Connections: Unknown (06/24/2021)   Received from [facility], [facility]  Stress: Stress Concern Present (05/07/2022)   Received from [facility], [facility]  Tobacco Use: High Risk (10/15/2022)    Readmission Risk Interventions     No data to display

## 2022-11-03 NOTE — Progress Notes (Signed)
PROGRESS NOTE    Carmen Daniels  NWG:[id number] DOB: 08-22-1985 DOA: 10/14/2022 PCP: Salli Real, MD   Brief Narrative: 37 year old with medical history significant for bipolar, substance abuse disorder, prior overdose and questionable suicidal ideation at outside facility.  She presents to the ED after being found down by family.  Nearby there was noted  vomit and  multiple medications.  Estimated downtime approximately 20 hours.  Patient was admitted with concern of overdose and subsequent withdrawal, hospitalist was called for admission.  On admission she was found to have acute toxic metabolic encephalopathy and severe rhabdomyolysis.  She was started on aggressive IV fluids.  Subsequently rhabdo resolved.  She was evaluated by psychiatry who initially recommended inpatient psych admission.  Reevaluated by psych on 9/3 patient does not require inpatient psych admission, they recommend home health PT or skilled rehab.  She will complete 10  days of doxycycline for multiple sites of small areas of cellulitis.  There was concern for right lower extremity peroneal nerve palsy.  AFO boot ordered.  Awaiting safe disposition.    Assessment & Plan:   Acute toxic encephalopathy, POA History of substance abuse disorder Concern for overdose, accidental versus intentional Toxic encephalopathy due to multidrug overdose. She has been  clear by psychiatric for outpatient care Patient is back to baseline.  Rhabdomyolysis, nontraumatic CK level normalized.  Treated with IV fluids Resolved.   Right lower extremity erythema mild fluid-filled lesion improving Lower extremity palsy, right lower extremity peroneal palsy compression Patient with neuropathic pain, continue with Cymbalta and Neurontin Patient completed course of doxycycline.   MRI of the Lumbar Spine showed Chronic Lumbar arthropathy She might benefit from outpatient EMG. Patient to use AFO She has been seen previously by Dr. Susa Simmonds for  bimalleolar fracture December 2023. Appreciate ortho evaluation. ABI normal.   Tachycardia: resolved.  Continue with IV fluids D dimer elevated. CTA chest negative for PE, Doppler negative for DVT  Hypokalemia/hypophosphatemia/hypomagnesemia Replaced.  Recheck labs tomorrow.  Lactic Acidosis  Resolved with fluids  Diarrhea Improved with probiotics.  Bipolar disorder, borderline personality disorder: Trazodone as needed, on Caplyta  Migraine On imiprex  DVT prophylaxis: Lovenox Code Status: Full code Family Communication: Care discussed with patient  Disposition Plan: To be determined   Consultants:  psych   Subjective: Feels well.  Denies any complaints.     Objective: Vitals:   11/02/22 0408 11/02/22 1300 11/02/22 2038 11/03/22 0539  BP: 131/81 120/82 116/72 120/67  Pulse: 81 98 89 90  Resp: 17 17 16 15   Temp: 98.1 F (36.7 C) 98.9 F (37.2 C) 99 F (37.2 C) 98.7 F (37.1 C)  TempSrc:  Oral Oral Oral  SpO2: 100% 100%    Weight:      Height:        Intake/Output Summary (Last 24 hours) at 11/03/2022 1008 Last data filed at 11/02/2022 1700 Gross per 24 hour  Intake 480 ml  Output --  Net 480 ml     Filed Weights   10/15/22 0223  Weight: 79.3 kg    Examination:  General appearance: Awake alert.  In no distress Resp: Clear to auscultation bilaterally.  Normal effort Cardio: S1-S2 is normal regular.  No S3-S4.  No rubs murmurs or bruit GI: Abdomen is soft.  Nontender nondistended.  Bowel sounds are present normal.  No masses organomegaly   Data Reviewed: I have personally reviewed following labs and imaging studies  CBC: Recent Labs  Lab 10/30/22 0713  WBC 8.4  HGB  14.2  HCT 41.9  MCV 87.3  PLT 371   Basic Metabolic Panel: Recent Labs  Lab 10/30/22 0713  NA 137  K 3.6  CL 101  CO2 23  GLUCOSE 114*  BUN 9  CREATININE 0.71  CALCIUM 9.1   GFR: Estimated Creatinine Clearance: 106.6 mL/min (by C-G formula based on SCr of 0.71  mg/dL).    Radiology Studies: VAS [place] LOWER EXTREMITY VENOUS (DVT)  Result Date: 11/01/2022  Lower Venous DVT Study Patient Name:  Carmen Daniels  Date of Exam:   11/01/2022 Medical Rec #: [id number]       Accession #:    [id number] Date of Birth: 1985/11/24        Patient Gender: F Patient Age:   85 years Exam Location:  [facility] Procedure:      VAS [place] LOWER EXTREMITY VENOUS (DVT) Referring Phys: Jon Billings REGALADO --------------------------------------------------------------------------------  Indications: Edema.  Limitations: Poor ultrasound/tissue interface. Comparison Study: No significant changes seen since previous exam 07/03/20 Performing Technologist: Shona Simpson  Examination Guidelines: A complete evaluation includes B-mode imaging, spectral Doppler, color Doppler, and power Doppler as needed of all accessible portions of each vessel. Bilateral testing is considered an integral part of a complete examination. Limited examinations for reoccurring indications may be performed as noted. The reflux portion of the exam is performed with the patient in reverse Trendelenburg.  +---------+---------------+---------+-----------+----------+--------------+ RIGHT    CompressibilityPhasicitySpontaneityPropertiesThrombus Aging +---------+---------------+---------+-----------+----------+--------------+ CFV      Full           Yes      Yes                                 +---------+---------------+---------+-----------+----------+--------------+ SFJ      Full                                                        +---------+---------------+---------+-----------+----------+--------------+ FV Prox  Full                                                        +---------+---------------+---------+-----------+----------+--------------+ FV Mid   Full                                                        +---------+---------------+---------+-----------+----------+--------------+ FV  DistalFull                                                        +---------+---------------+---------+-----------+----------+--------------+ PFV      Full                                                        +---------+---------------+---------+-----------+----------+--------------+  POP      Full           Yes      Yes                                 +---------+---------------+---------+-----------+----------+--------------+ PTV      Full                                                        +---------+---------------+---------+-----------+----------+--------------+ PERO     Full                                                        +---------+---------------+---------+-----------+----------+--------------+   +----+---------------+---------+-----------+----------+--------------+ LEFTCompressibilityPhasicitySpontaneityPropertiesThrombus Aging +----+---------------+---------+-----------+----------+--------------+ CFV Full           Yes      Yes                                 +----+---------------+---------+-----------+----------+--------------+     Summary: RIGHT: - There is no evidence of deep vein thrombosis in the lower extremity.  - No cystic structure found in the popliteal fossa.  LEFT: - No evidence of common femoral vein obstruction.   *See table(s) above for measurements and observations. Electronically signed by Sherald Hess MD on 11/01/2022 at 4:04:41 PM.    Final         Scheduled Meds:  Chlorhexidine Gluconate Cloth  6 each Topical Q0600   vitamin B-12  500 mcg Oral Daily   DULoxetine  120 mg Oral Daily   enoxaparin (LOVENOX) injection  40 mg Subcutaneous Q24H   feeding supplement  237 mL Oral BID BM   gabapentin  300 mg Oral TID   lumateperone tosylate  42 mg Oral Daily   mupirocin ointment   Nasal BID   nicotine  21 mg Transdermal Daily   potassium chloride  40 mEq Oral Daily   saccharomyces boulardii  250 mg Oral BID   sodium  chloride flush  10-40 mL Intracatheter Q12H   Continuous Infusions:     LOS: 19 days    Osvaldo Shipper, MD [facility]   If 7PM-7AM, please contact night-coverage [email or web address]  11/03/2022, 10:08 AM

## 2022-11-03 NOTE — Progress Notes (Signed)
Pt. Reports new area of induration to right hip. Dr. Rito Ehrlich made aware. No orders given at this time. Will continue to monitor.

## 2022-11-03 NOTE — Progress Notes (Signed)
Physical Therapy Treatment Patient Details Name: Carmen Daniels MRN: [id number] DOB: 1985-07-19 Today's Date: 11/03/2022   History of Present Illness Pt is 37 yo presenting to [facility] ED 8/22 after being found down by family near vomit and multiple medications. Pt was found to have acute toxic metabolic encephalopathy and severe rhabdomyolysis. PMH: bipolar, substance abuse disorder, fibromyalgia, schizoaffective disorder, BPD, asthma, OCD, prior overdose and questionable suicidal ideation at outside facility.    PT Comments  On entry asked pt how often she takes her boot off. Pt reports she is supposed to leave it on at all times. Educated on need to remove at least 2x daily to visualize for wound healing and possible emergence of other wounds. Also instructed pt in AAROM of R ankle using gait belt. Pt agreeable to simulating situation likely at rehab center. Pt able to ambulate around room with collecting personal belongings and take them into the bathroom where she was able to brush her teeth. While brushing her teeth pt had 1x LoB which she was able to self correct. After which pt transferred to wheelchair where she used B UE and L LE to propel herself around the unit. Pt has made great strides in her independence this week and no longer requires physical assist for her mobility. Discharge to inpatient drug rehab would be best possible discharge location.    If plan is discharge home, recommend the following: Help with stairs or ramp for entrance;A little help with walking and/or transfers;Assistance with cooking/housework;Assist for transportation   Can travel by private vehicle      Yes  Equipment Recommendations  None recommended by PT (Friend states today that she can grab needed DME from house.)       Precautions / Restrictions Precautions Precautions: Fall Precaution Comments: R drop foot Restrictions Weight Bearing Restrictions: No     Mobility  Bed Mobility Overal bed mobility:  Independent                  Transfers Overall transfer level: Needs assistance Equipment used: Rolling walker (2 wheels) Transfers: Sit to/from Stand, Bed to chair/wheelchair/BSC Sit to Stand: Supervision           General transfer comment: able to power up from low bed surface and wheelchair surface    Ambulation/Gait Ambulation/Gait assistance: Supervision Gait Distance (Feet): 30 Feet Assistive device: Rolling walker (2 wheels) Gait Pattern/deviations: Step-to pattern, Decreased weight shift to right, Decreased step length - left Gait velocity: decreased     General Gait Details: pt able to walk around room collecting personal items into a bag, taking them into the bathroom and after seated rest break on shower seat is able to get up and brush her teeth. During teeth brushing pt experiences 1x LoB which she is able to self steady from     [employer]chant [employer] officer mobility: Yes Wheelchair propulsion: Both upper extremities, Left lower extremity Wheelchair parts: Independent Distance: Administrator Details (indicate cue type and reason): requires multiple short bouts of rest but does not need any outside assist         Balance Overall balance assessment: Needs assistance Sitting-balance support: No upper extremity supported, Feet supported Sitting balance-Leahy Scale: Good Sitting balance - Comments: able to perform dressing seated on EOB   Standing balance support: Bilateral upper extremity supported, During functional activity Standing balance-Leahy Scale: Fair Standing balance comment: Pt requires UE support for functional mobility due to R foot deficits  Cognition Arousal: Alert Behavior During Therapy: WFL for tasks assessed/performed Overall Cognitive Status: Within Functional Limits for tasks assessed                                 General Comments:  continues to be very flat, friend present whose encouragement decreases self limiting behaviors        Exercises Other Exercises Other Exercises: AAROM R ankle with gait belt, pt cued to assist with DF and PF.exhibits trace activation of both DF and PF    General Comments General comments (skin integrity, edema, etc.): VSS, friend present throughout session continues to be very encouraging, pt with increased conversation      Pertinent Vitals/Pain Pain Assessment Pain Assessment: Faces Faces Pain Scale: Hurts a little bit Breathing: normal Facial Expression: smiling or inexpressive Body Language: relaxed Consolability: no need to console Pain Location: R foot Pain Descriptors / Indicators: Burning, Discomfort, Grimacing Pain Intervention(s): Limited activity within patient's tolerance, Monitored during session, Repositioned     PT Goals (current goals can now be found in the care plan section) Acute Rehab PT Goals Patient Stated Goal: to go to rehab PT Goal Formulation: With patient Time For Goal Achievement: 11/04/22 Potential to Achieve Goals: Good Progress towards PT goals: Progressing toward goals    Frequency    Min 1X/week       AM-PAC PT "6 Clicks" Mobility   Outcome Measure  Help needed turning from your back to your side while in a flat bed without using bedrails?: None Help needed moving from lying on your back to sitting on the side of a flat bed without using bedrails?: None Help needed moving to and from a bed to a chair (including a wheelchair)?: A Little Help needed standing up from a chair using your arms (e.g., wheelchair or bedside chair)?: A Little Help needed to walk in hospital room?: A Little Help needed climbing 3-5 steps with a railing? : A Lot 6 Click Score: 19    End of Session Equipment Utilized During Treatment: Other (comment) (CAM boot) Activity Tolerance: Patient tolerated treatment well Patient left: with call bell/phone within  reach;in chair;with chair alarm set Nurse Communication: Mobility status PT Visit Diagnosis: Unsteadiness on feet (R26.81);Other abnormalities of gait and mobility (R26.89)     Time: 3818-2993 PT Time Calculation (min) (ACUTE ONLY): 56 min  Charges:    $Gait Training: 8-22 mins $Therapeutic Exercise: 8-22 mins $Therapeutic Activity: 8-22 mins $Wheel Chair Management: 8-22 mins PT General Charges $$ ACUTE PT VISIT: 1 Visit                     Teige Rountree B. Beverely Risen PT, DPT Acute Rehabilitation Services Please use secure chat or  Call Office [phone]    Elon Alas [facility] 11/03/2022, 5:37 PM

## 2022-11-04 DIAGNOSIS — T148XXA Other injury of unspecified body region, initial encounter: Secondary | ICD-10-CM

## 2022-11-04 DIAGNOSIS — L089 Local infection of the skin and subcutaneous tissue, unspecified: Secondary | ICD-10-CM

## 2022-11-04 DIAGNOSIS — F314 Bipolar disorder, current episode depressed, severe, without psychotic features: Secondary | ICD-10-CM | POA: Diagnosis not present

## 2022-11-04 MED ORDER — BACITRACIN-NEOMYCIN-POLYMYXIN OINTMENT TUBE
TOPICAL_OINTMENT | Freq: Two times a day (BID) | CUTANEOUS | Status: DC
  Filled 2022-11-04 (×2): qty 14

## 2022-11-04 NOTE — Plan of Care (Signed)

## 2022-11-04 NOTE — Progress Notes (Signed)
Pt refused CHG bath at this time and she asked to be rescheduled for day time. The due time was adjusted

## 2022-11-04 NOTE — TOC Progression Note (Addendum)
Transition of Care [facility]) - Progression Note    Patient Details  Name: Carmen Daniels MRN: [id number] Date of Birth: 09-Jan-1986  [facility] CM/SW Contact  Lorri Frederick, LCSW Phone Number: 11/04/2022, 10:31 AM  Clinical Narrative:    CSW spoke with [facility] residential.  They are confirming they have PT/OT notes.  Will call back with decision on pt being approved.  CSW spoke with Lelon Mast at [facility], was transferred to [facility], she also will call back shortly.   1040: TC Sherry.  She does need to talk with pt by phone again.  Nurse tech assisted in getting pt on the room phone to complete this.     1100: TC Michelle/[facility].  Pt is denied due to wound on her knee.    1120: TC Sherry/[facility]. She did get what she needed, referral has been sent to the RN, will call back with decision on whether they can accept her.  CSW inquired about bed availability.  Per Cordelia Pen, earliest open bed would be mid to late next week.       Expected Discharge Plan: Skilled Nursing Facility Barriers to Discharge: Other (must enter comment) (no bed offer)  Expected Discharge Plan and Services In-house Referral: Clinical Social Work   Post Acute Care Choice: IP Rehab Living arrangements for the past 2 months: Single Family Home Expected Discharge Date: 10/22/22                                     Social Determinants of Health (SDOH) Interventions SDOH Screenings   Food Insecurity: No Food Insecurity (10/15/2022)  Housing: Low Risk  (10/15/2022)  Transportation Needs: No Transportation Needs (10/15/2022)  Utilities: Not At Risk (10/15/2022)  Alcohol Screen: Low Risk  (04/10/2017)  Depression (PHQ2-9): High Risk (08/04/2022)  Financial Resource Strain: Medium Risk (05/07/2022)   Received from [facility], [facility]  Social Connections: Unknown (06/24/2021)   Received from [facility], [facility]  Stress: Stress Concern Present (05/07/2022)   Received from [facility],  [facility]  Tobacco Use: High Risk (10/15/2022)    Readmission Risk Interventions     No data to display

## 2022-11-04 NOTE — Progress Notes (Signed)
Occupational Therapy Treatment Patient Details Name: Carmen Daniels MRN: [id number] DOB: 10/11/1985 Today's Date: 11/04/2022   History of present illness Pt is 37 yo presenting to [facility] ED 8/22 after being found down by family near vomit and multiple medications. Pt was found to have acute toxic metabolic encephalopathy and severe rhabdomyolysis. PMH: bipolar, substance abuse disorder, fibromyalgia, schizoaffective disorder, BPD, asthma, OCD, prior overdose and questionable suicidal ideation at outside facility.   OT comments  Pt has been independently using BSC. Supervised to bathroom for toileting this visit and to sink for standing grooming. Pt able to don and doff CAM boot for skin check. Reminded her to continue skin checks 2x/day. Pt did not requires physical assistance for ADLs and mobility addressed this visit. Do not anticipate pt will need post acute OT.       If plan is discharge home, recommend the following:  A little help with walking and/or transfers;A little help with bathing/dressing/bathroom   Equipment Recommendations  None recommended by OT    Recommendations for Other Services      Precautions / Restrictions Precautions Precautions: Fall Precaution Comments: R drop foot Required Braces or Orthoses: Other Brace Other Brace: R CAM boot Restrictions Weight Bearing Restrictions: No       Mobility Bed Mobility Overal bed mobility: Independent             General bed mobility comments: HOB flat    Transfers Overall transfer level: Needs assistance Equipment used: Rolling walker (2 wheels) Transfers: Sit to/from Stand Sit to Stand: Supervision           General transfer comment: from bed and toilet     Balance Overall balance assessment: Needs assistance Sitting-balance support: No upper extremity supported, Feet supported Sitting balance-Leahy Scale: Good       Standing balance-Leahy Scale: Fair Standing balance comment: releases walker in  static standing without LOB at sink                           ADL either performed or assessed with clinical judgement   ADL Overall ADL's : Needs assistance/impaired     Grooming: Supervision/safety;Standing;Wash/dry hands                   Toilet Transfer: Supervision/safety;Rolling walker (2 wheels);Comfort height toilet   Toileting- Clothing Manipulation and Hygiene: Supervision/safety       Functional mobility during ADLs: Supervision/safety;Rolling walker (2 wheels)      Extremity/Trunk Assessment              Vision       Perception     Praxis      Cognition Arousal: Alert Behavior During Therapy: WFL for tasks assessed/performed, Flat affect Overall Cognitive Status: Within Functional Limits for tasks assessed                                          Exercises      Shoulder Instructions       General Comments      Pertinent Vitals/ Pain       Pain Assessment Pain Assessment: No/denies pain  Home Living  Prior Functioning/Environment              Frequency  Min 1X/week        Progress Toward Goals  OT Goals(current goals can now be found in the care plan section)  Progress towards OT goals: Progressing toward goals  Acute Rehab OT Goals OT Goal Formulation: With patient Time For Goal Achievement: 11/18/22 Potential to Achieve Goals: Good ADL Goals Pt Will Perform Lower Body Bathing: Independently Pt Will Perform Lower Body Dressing: Independently Pt Will Transfer to Toilet: Independently Pt Will Perform Toileting - Clothing Manipulation and hygiene: Independently  Plan      Co-evaluation                 AM-PAC OT "6 Clicks" Daily Activity     Outcome Measure   Help from another person eating meals?: None Help from another person taking care of personal grooming?: None Help from another person toileting, which includes  using toliet, bedpan, or urinal?: A Little Help from another person bathing (including washing, rinsing, drying)?: A Little Help from another person to put on and taking off regular upper body clothing?: None Help from another person to put on and taking off regular lower body clothing?: None 6 Click Score: 22    End of Session Equipment Utilized During Treatment: Gait belt;Rolling walker (2 wheels)  OT Visit Diagnosis: Unsteadiness on feet (R26.81);Muscle weakness (generalized) (M62.81)   Activity Tolerance Patient tolerated treatment well   Patient Left in bed;with call bell/phone within reach;with family/visitor present;with bed alarm set   Nurse Communication          Time: 1240-1300 OT Time Calculation (min): 20 min  Charges: OT General Charges $OT Visit: 1 Visit OT Treatments $Self Care/Home Management : 8-22 mins  Carmen Daniels, OTR/L Acute Rehabilitation Services Office: [phone]   Carmen Daniels 11/04/2022, 1:15 PM

## 2022-11-04 NOTE — Plan of Care (Signed)
Patient alert/oriented X4. Patient compliant with medication administration and administered tramadol as needed for pain. Patient VSS, no complaints at this time.  Problem: Education: Goal: Knowledge of General Education information will improve Description: Including pain rating scale, medication(s)/side effects and non-pharmacologic comfort measures Outcome: Progressing   Problem: Health Behavior/Discharge Planning: Goal: Ability to manage health-related needs will improve Outcome: Progressing   Problem: Clinical Measurements: Goal: Ability to maintain clinical measurements within normal limits will improve Outcome: Progressing   Problem: Clinical Measurements: Goal: Will remain free from infection Outcome: Progressing   Problem: Clinical Measurements: Goal: Diagnostic test results will improve Outcome: Progressing   Problem: Clinical Measurements: Goal: Respiratory complications will improve Outcome: Progressing   Problem: Clinical Measurements: Goal: Cardiovascular complication will be avoided Outcome: Progressing   Problem: Activity: Goal: Risk for activity intolerance will decrease Outcome: Progressing   Problem: Nutrition: Goal: Adequate nutrition will be maintained Outcome: Progressing   Problem: Coping: Goal: Level of anxiety will decrease Outcome: Progressing   Problem: Elimination: Goal: Will not experience complications related to bowel motility Outcome: Progressing   Problem: Elimination: Goal: Will not experience complications related to urinary retention Outcome: Progressing   Problem: Pain Managment: Goal: General experience of comfort will improve Outcome: Progressing   Problem: Safety: Goal: Ability to remain free from injury will improve Outcome: Progressing   Problem: Skin Integrity: Goal: Risk for impaired skin integrity will decrease Outcome: Progressing

## 2022-11-04 NOTE — Progress Notes (Signed)
PROGRESS NOTE    Carmen Daniels  HQI:[id number] DOB: [birth year] DOA: 10/14/2022 PCP: Salli Real, MD   Brief Narrative: 37 year old with medical history significant for bipolar, substance abuse disorder, prior overdose and questionable suicidal ideation at outside facility.  She presents to the ED after being found down by family.  Nearby there was noted  vomit and  multiple medications.  Estimated downtime approximately 20 hours.  Patient was admitted with concern of overdose and subsequent withdrawal, hospitalist was called for admission.  On admission she was found to have acute toxic metabolic encephalopathy and severe rhabdomyolysis.  She was started on aggressive IV fluids.  Subsequently rhabdo resolved.  She was evaluated by psychiatry who initially recommended inpatient psych admission.  Reevaluated by psych on 9/3 patient does not require inpatient psych admission, they recommend home health PT or skilled rehab.  She will complete 10  days of doxycycline for multiple sites of small areas of cellulitis.  There was concern for right lower extremity peroneal nerve palsy.  AFO boot ordered.  Awaiting safe disposition.    Assessment & Plan:   Acute toxic encephalopathy, POA History of substance abuse disorder Concern for overdose, accidental versus intentional Toxic encephalopathy due to multidrug overdose. She has been  clear by psychiatric for outpatient care Patient is back to baseline.  Rhabdomyolysis, nontraumatic CK level normalized.  Treated with IV fluids Resolved.   Right lower extremity erythema mild fluid-filled lesion improving Lower extremity palsy, right lower extremity peroneal palsy compression Patient with neuropathic pain, continue with Cymbalta and Neurontin Patient completed course of doxycycline.   MRI of the Lumbar Spine showed Chronic Lumbar arthropathy She might benefit from outpatient EMG. Patient to use AFO She has been seen previously by Dr. Susa Simmonds for  bimalleolar fracture December 2023. Appreciate ortho evaluation. ABI normal.   Wound left lower extremity She does have a wound around her left knee area.  Will need to use antibacterial ointment with dressing changes twice a day.  Tachycardia: resolved.  D dimer elevated. CTA chest negative for PE, Doppler negative for DVT  Hypokalemia/hypophosphatemia/hypomagnesemia Replaced.  Recheck labs tomorrow.  Lactic Acidosis  Resolved with fluids  Diarrhea Improved with probiotics.  Bipolar disorder, borderline personality disorder: Trazodone as needed, on Caplyta  Migraine On imiprex  DVT prophylaxis: Lovenox Code Status: Full code Family Communication: Care discussed with patient  Disposition Plan: To be determined   Consultants:  psych   Subjective: Concerned about the wound around her left knee area.  Couple other skin lesions also noted with mild induration.  No fluctuation noted.   Objective: Vitals:   11/03/22 1400 11/03/22 1935 11/04/22 0337 11/04/22 0958  BP: 111/84 120/73 (!) 140/94 124/88  Pulse:  98 87 (!) 101  Resp: 18 18 18 17   Temp: 98.2 F (36.8 C) 98.2 F (36.8 C)  98.6 F (37 C)  TempSrc: Oral Oral  Oral  SpO2: 100% 100% 99% 100%  Weight:      Height:        Intake/Output Summary (Last 24 hours) at 11/04/2022 1023 Last data filed at 11/03/2022 2121 Gross per 24 hour  Intake 730 ml  Output --  Net 730 ml     Filed Weights   10/15/22 0223  Weight: 79.3 kg    Examination:  General appearance: Awake alert.  In no distress Resp: Clear to auscultation bilaterally.  Normal effort Cardio: S1-S2 is normal regular.  No S3-S4.  No rubs murmurs or bruit GI: Abdomen is soft.  Nontender nondistended.  Bowel sounds are present normal.  No masses organomegaly Skin: 1 cm x 1 cm wound noted in the left lower extremity around the knee with the yellowish exudate.  Mild surrounding erythema.   Data Reviewed: I have personally reviewed following labs  and imaging studies  CBC: Recent Labs  Lab 10/30/22 0713  WBC 8.4  HGB 14.2  HCT 41.9  MCV 87.3  PLT 371   Basic Metabolic Panel: Recent Labs  Lab 10/30/22 0713  NA 137  K 3.6  CL 101  CO2 23  GLUCOSE 114*  BUN 9  CREATININE 0.71  CALCIUM 9.1   GFR: Estimated Creatinine Clearance: 106.6 mL/min (by C-G formula based on SCr of 0.71 mg/dL).    Radiology Studies: No results found.      Scheduled Meds:  Chlorhexidine Gluconate Cloth  6 each Topical Q0600   vitamin B-12  500 mcg Oral Daily   DULoxetine  120 mg Oral Daily   enoxaparin (LOVENOX) injection  40 mg Subcutaneous Q24H   feeding supplement  237 mL Oral BID BM   gabapentin  300 mg Oral TID   lumateperone tosylate  42 mg Oral Daily   mupirocin ointment   Nasal BID   nicotine  21 mg Transdermal Daily   potassium chloride  40 mEq Oral Daily   saccharomyces boulardii  250 mg Oral BID   sodium chloride flush  10-40 mL Intracatheter Q12H   Continuous Infusions:     LOS: 20 days    Osvaldo Shipper, MD [facility]   If 7PM-7AM, please contact night-coverage [email or web address]  11/04/2022, 10:23 AM

## 2022-11-04 NOTE — Progress Notes (Signed)
Mobility Specialist: Progress Note   11/04/22 1507  Mobility  Activity Ambulated with assistance in hallway  Level of Assistance Standby assist, set-up cues, supervision of patient - no hands on  Assistive Device Front wheel walker  Distance Ambulated (ft) 25 ft  Activity Response Tolerated well  Mobility Referral Yes  $Mobility charge 1 Mobility  Mobility Specialist Start Time (ACUTE ONLY) 1455  Mobility Specialist Stop Time (ACUTE ONLY) 1500  Mobility Specialist Time Calculation (min) (ACUTE ONLY) 5 min    Pt was agreeable to mobility session - received in bed. Took 1x seated break d/t c/o dizziness and ear ringing. Returned to room without fault. At EOS, c/o intense dizziness rated 8/10 that was starting to cause vision issues. Left in bed with all needs met, call bell in reach.   Maurene Capes Mobility Specialist Please contact via SecureChat or Rehab office at [phone]

## 2022-11-05 LAB — COMPREHENSIVE METABOLIC PANEL
ALT: 28 U/L (ref 0–44)
AST: 21 U/L (ref 15–41)
Albumin: 3.1 g/dL — ABNORMAL LOW (ref 3.5–5.0)
Alkaline Phosphatase: 62 U/L (ref 38–126)
Anion gap: 7 (ref 5–15)
BUN: 5 mg/dL — ABNORMAL LOW (ref 6–20)
CO2: 26 mmol/L (ref 22–32)
Calcium: 8.8 mg/dL — ABNORMAL LOW (ref 8.9–10.3)
Chloride: 104 mmol/L (ref 98–111)
Creatinine, Ser: 0.69 mg/dL (ref 0.44–1.00)
GFR, Estimated: >60 mL/min (ref >60)
Glucose, Bld: 100 mg/dL — ABNORMAL HIGH (ref 70–99)
Potassium: 3.7 mmol/L (ref 3.5–5.1)
Sodium: 137 mmol/L (ref 135–145)
Total Bilirubin: 0.8 mg/dL (ref 0.3–1.2)
Total Protein: 5.8 g/dL — ABNORMAL LOW (ref 6.5–8.1)

## 2022-11-05 LAB — MAGNESIUM: Magnesium: 1.9 mg/dL (ref 1.7–2.4)

## 2022-11-05 LAB — CBC
HCT: 38.9 % (ref 36.0–46.0)
Hemoglobin: 12.9 g/dL (ref 12.0–15.0)
MCH: 29 pg (ref 26.0–34.0)
MCHC: 33.2 g/dL (ref 30.0–36.0)
MCV: 87.4 fL (ref 80.0–100.0)
Platelets: 226 10*3/uL (ref 150–400)
RBC: 4.45 MIL/uL (ref 3.87–5.11)
RDW: 14.1 % (ref 11.5–15.5)
WBC: 6 10*3/uL (ref 4.0–10.5)
nRBC: 0 % (ref 0.0–0.2)

## 2022-11-05 LAB — MRSA NEXT GEN BY PCR, NASAL: MRSA by PCR Next Gen: NOT DETECTED

## 2022-11-05 NOTE — Progress Notes (Signed)
PROGRESS NOTE    Carmen Daniels  ZOX:[id number] DOB: [birth year] DOA: 10/14/2022 PCP: Salli Real, MD   Brief Narrative: 37 year old with medical history significant for bipolar, substance abuse disorder, prior overdose and questionable suicidal ideation at outside facility.  She presents to the ED after being found down by family.  Nearby there was noted  vomit and  multiple medications.  Estimated downtime approximately 20 hours.  Patient was admitted with concern of overdose and subsequent withdrawal, hospitalist was called for admission.  On admission she was found to have acute toxic metabolic encephalopathy and severe rhabdomyolysis.  She was started on aggressive IV fluids.  Subsequently rhabdo resolved.  She was evaluated by psychiatry who initially recommended inpatient psych admission.  Reevaluated by psych on 9/3 patient does not require inpatient psych admission, they recommend home health PT or skilled rehab.  She will complete 10  days of doxycycline for multiple sites of small areas of cellulitis.  There was concern for right lower extremity peroneal nerve palsy.  AFO boot ordered.  Awaiting safe disposition.    Assessment & Plan:   Acute toxic encephalopathy, POA History of substance abuse disorder Concern for overdose, accidental versus intentional Toxic encephalopathy due to multidrug overdose. She has been cleared by psychiatric for outpatient care Patient is back to baseline.  Rhabdomyolysis, nontraumatic CK level normalized.  Treated with IV fluids Resolved.   Right lower extremity erythema mild fluid-filled lesion improving Lower extremity palsy, right lower extremity peroneal palsy compression Patient with neuropathic pain, continue with Cymbalta and Neurontin Patient completed course of doxycycline.   MRI of the Lumbar Spine showed Chronic Lumbar arthropathy She might benefit from outpatient EMG. Patient to use AFO She has been seen previously by Dr. Susa Simmonds for  bimalleolar fracture December 2023. Appreciate ortho evaluation. ABI normal.   Wound left lower extremity She does have a wound around her left knee area.  Continue with wound care with antibacterial ointment twice a day.  No indication for systemic antibiotics.  WBC is normal.  Patient was reassured.  Tachycardia: resolved.  D dimer elevated. CTA chest negative for PE, Doppler negative for DVT  Hypokalemia/hypophosphatemia/hypomagnesemia Replaced.  Labs are stable this morning.  Lactic Acidosis  Resolved with fluids  Diarrhea Improved with probiotics.  Bipolar disorder, borderline personality disorder: Trazodone as needed, on Caplyta  Migraine On imiprex  DVT prophylaxis: Lovenox Code Status: Full code Family Communication: Care discussed with patient  Disposition Plan: To be determined   Consultants:  psych   Subjective: No new complaints offered.   Objective: Vitals:   11/04/22 1452 11/04/22 1932 11/05/22 0400 11/05/22 0804  BP: 129/83 114/67 116/68 133/74  Pulse: (!) 104 87 88 (!) 101  Resp: 16 18 18 19   Temp: 98.6 F (37 C) 98.5 F (36.9 C) 98.4 F (36.9 C) 98.4 F (36.9 C)  TempSrc: Oral Oral Oral Oral  SpO2: 100% 100% 99% 98%  Weight:      Height:       No intake or output data in the 24 hours ending 11/05/22 0955    Filed Weights   10/15/22 0223  Weight: 79.3 kg    Examination:  The left leg wound is stable.  No worsening noted.   Data Reviewed:   CBC: Recent Labs  Lab 10/30/22 0713 11/05/22 0343  WBC 8.4 6.0  HGB 14.2 12.9  HCT 41.9 38.9  MCV 87.3 87.4  PLT 371 226   Basic Metabolic Panel: Recent Labs  Lab 10/30/22 0713 11/05/22  0343  NA 137 137  K 3.6 3.7  CL 101 104  CO2 23 26  GLUCOSE 114* 100*  BUN 9 5*  CREATININE 0.71 0.69  CALCIUM 9.1 8.8*  MG  --  1.9   GFR: Estimated Creatinine Clearance: 106.6 mL/min (by C-G formula based on SCr of 0.69 mg/dL).    Radiology Studies: No results  found.      Scheduled Meds:  Chlorhexidine Gluconate Cloth  6 each Topical Q0600   vitamin B-12  500 mcg Oral Daily   DULoxetine  120 mg Oral Daily   enoxaparin (LOVENOX) injection  40 mg Subcutaneous Q24H   feeding supplement  237 mL Oral BID BM   gabapentin  300 mg Oral TID   lumateperone tosylate  42 mg Oral Daily   mupirocin ointment   Nasal BID   neomycin-bacitracin-polymyxin   Topical BID   nicotine  21 mg Transdermal Daily   potassium chloride  40 mEq Oral Daily   saccharomyces boulardii  250 mg Oral BID   sodium chloride flush  10-40 mL Intracatheter Q12H   Continuous Infusions:     LOS: 21 days   This is a no charge note.  Osvaldo Shipper, MD [facility]   If 7PM-7AM, please contact night-coverage [email or web address]  11/05/2022, 9:55 AM

## 2022-11-05 NOTE — TOC Progression Note (Addendum)
Transition of Care [facility]) - Progression Note    Patient Details  Name: Carmen Daniels MRN: [id number] Date of Birth: 06-02-1985  [facility]) CM/SW Contact  Lorri Frederick, LCSW Phone Number: 11/05/2022, 11:40 AM  Clinical Narrative:   CSW spoke to [facility] admissions.  No decision on whether they can accept pt.   1525: [facility]. Pt has been approved for admission.  Earliest date they can accept is Thursday, 9/19 at 11am.  They would like pt to call them prior to that time to go over what to bring at: [phone].  Treatment team informed.  Expected Discharge Plan: Skilled Nursing Facility Barriers to Discharge: Other (must enter comment) (no bed offer)  Expected Discharge Plan and Services In-house Referral: Clinical Social Work   Post Acute Care Choice: IP Rehab Living arrangements for the past 2 months: Single Family Home Expected Discharge Date: 10/22/22                                     Social Determinants of Health (SDOH) Interventions SDOH Screenings   Food Insecurity: No Food Insecurity (10/15/2022)  Housing: Low Risk  (10/15/2022)  Transportation Needs: No Transportation Needs (10/15/2022)  Utilities: Not At Risk (10/15/2022)  Alcohol Screen: Low Risk  (04/10/2017)  Depression (PHQ2-9): High Risk (08/04/2022)  Financial Resource Strain: Medium Risk (05/07/2022)   Received from [facility], [facility]  Social Connections: Unknown (06/24/2021)   Received from [facility], [facility]  Stress: Stress Concern Present (05/07/2022)   Received from [facility], [facility]  Tobacco Use: High Risk (10/15/2022)    Readmission Risk Interventions     No data to display

## 2022-11-05 NOTE — Progress Notes (Signed)
Mobility Specialist: Progress Note   11/05/22 1600  Mobility  Activity Ambulated with assistance in hallway  Level of Assistance Standby assist, set-up cues, supervision of patient - no hands on (with w/c follow)  Assistive Device Front wheel walker  Distance Ambulated (ft) 100 ft  Activity Response Tolerated well  Mobility Referral Yes  $Mobility charge 1 Mobility  Mobility Specialist Start Time (ACUTE ONLY) 1540  Mobility Specialist Stop Time (ACUTE ONLY) 1546  Mobility Specialist Time Calculation (min) (ACUTE ONLY) 6 min    Pt was agreeable to mobility session - received in bed. Took 1x seated break (~30sec) d/t dizziness and ear ringing. Returned to room without fault. Left in bed with all needs met, call bell in reach. Friend in room.   Vision was not affected by dizziness today.   Maurene Capes Mobility Specialist Please contact via SecureChat or Rehab office at [phone]

## 2022-11-05 NOTE — Progress Notes (Signed)
Physical Therapy Treatment Patient Details Name: Carmen Daniels MRN: [id number] DOB: [birth year] Today's Date: 11/05/2022   History of Present Illness Pt is 37 yo presenting to [facility] ED 8/22 after being found down by family near vomit and multiple medications. Pt was found to have acute toxic metabolic encephalopathy and severe rhabdomyolysis. PMH: bipolar, substance abuse disorder, fibromyalgia, schizoaffective disorder, BPD, asthma, OCD, prior overdose and questionable suicidal ideation at outside facility.    PT Comments  Initially checked in with pt and she reported increased L knee wound pain, but that she had just gotten pain medication. Pt agreeable to therapy in 30 minutes. When PT returned pt reports minor pain relief but is agreeable to work with therapy. Pt able to perform HEP independently to stretch LE with gait belt prior to ambulation. Pt supervision for transfers and ambulation in hallway. Gait is much stronger and steadier today. Ambulation distance limited by onset of dizziness. Pt continues to make good progress towards her goals. PT will continue to follow acutely to improve strength and endurance for discharge to inpatient drug rehab program.    If plan is discharge home, recommend the following: Help with stairs or ramp for entrance;A little help with walking and/or transfers;Assistance with cooking/housework;Assist for transportation   Can travel by private vehicle        Equipment Recommendations  None recommended by PT (Friend states today that she can grab needed DME from house.)       Precautions / Restrictions Precautions Precautions: Fall Precaution Comments: R drop foot Required Braces or Orthoses: Other Brace Other Brace: R CAM boot Restrictions Weight Bearing Restrictions: No     Mobility  Bed Mobility Overal bed mobility: Independent             General bed mobility comments: HOB flat    Transfers Overall transfer level: Needs assistance Equipment  used: Rolling walker (2 wheels) Transfers: Sit to/from Stand Sit to Stand: Supervision           General transfer comment: good hand placement, power up and self steadying in RW    Ambulation/Gait Ambulation/Gait assistance: Supervision Gait Distance (Feet): 50 Feet Assistive device: Rolling walker (2 wheels) Gait Pattern/deviations: Step-to pattern, Decreased weight shift to right, Decreased step length - left Gait velocity: decreased Gait velocity interpretation: 1.31 - 2.62 ft/sec, indicative of limited community ambulator   General Gait Details: pt gait is much stronger today, but pt continues to be limited by increased dizziness and ringing in ears. Attempted to have pt take standing rest break with minimal decrease in dizziness, pt with request to return to room       Balance Overall balance assessment: Needs assistance Sitting-balance support: No upper extremity supported, Feet supported Sitting balance-Leahy Scale: Good Sitting balance - Comments: able to perform dressing seated on EOB   Standing balance support: Bilateral upper extremity supported, During functional activity Standing balance-Leahy Scale: Fair Standing balance comment: releases walker in static standing without LOB at sink                            Cognition Arousal: Alert Behavior During Therapy: WFL for tasks assessed/performed, Flat affect Overall Cognitive Status: Within Functional Limits for tasks assessed                                 General Comments: continues to be very flat, able to  self motivate today to overcome painful situation        Exercises General Exercises - Lower Extremity Long Arc Quad: AROM, Both, 10 reps, Seated Hip Flexion/Marching: AROM, Both, 10 reps, Seated Toe Raises: AAROM, Right, 10 reps, AROM, Left, Seated Heel Raises: AAROM, Right, 10 reps, AROM, Left, Seated Other Exercises Other Exercises: AAROM R ankle with gait belt, pt cued  to assist with DF and PF.exhibits trace activation of both DF and PF    General Comments General comments (skin integrity, edema, etc.): VSS on RA, wound on R foot has less edema and redness, L knee wound dressing clean dry and intact      Pertinent Vitals/Pain Pain Assessment Pain Assessment: 0-10 Pain Score: 8  Pain Location: L knee wound Pain Descriptors / Indicators: Burning, Discomfort, Grimacing, Guarding Pain Intervention(s): Limited activity within patient's tolerance, Monitored during session, Premedicated before session, Repositioned     PT Goals (current goals can now be found in the care plan section) Acute Rehab PT Goals Patient Stated Goal: to go to rehab PT Goal Formulation: With patient Time For Goal Achievement: 11/04/22 Potential to Achieve Goals: Good Progress towards PT goals: Progressing toward goals    Frequency    Min 1X/week       AM-PAC PT "6 Clicks" Mobility   Outcome Measure  Help needed turning from your back to your side while in a flat bed without using bedrails?: None Help needed moving from lying on your back to sitting on the side of a flat bed without using bedrails?: None Help needed moving to and from a bed to a chair (including a wheelchair)?: A Little Help needed standing up from a chair using your arms (e.g., wheelchair or bedside chair)?: A Little Help needed to walk in hospital room?: A Little Help needed climbing 3-5 steps with a railing? : A Lot 6 Click Score: 19    End of Session Equipment Utilized During Treatment: Other (comment) (CAM boot) Activity Tolerance: Patient tolerated treatment well Patient left: with call bell/phone within reach;in chair;with chair alarm set Nurse Communication: Mobility status PT Visit Diagnosis: Unsteadiness on feet (R26.81);Other abnormalities of gait and mobility (R26.89)     Time: 1105-1120 PT Time Calculation (min) (ACUTE ONLY): 15 min  Charges:    $Gait Training: 8-22 mins PT  General Charges $$ ACUTE PT VISIT: 1 Visit                     Danea Manter B. Beverely Risen PT, DPT Acute Rehabilitation Services Please use secure chat or  Call Office [phone]    Elon Alas [facility] 11/05/2022, 11:49 AM

## 2022-11-05 NOTE — Plan of Care (Signed)

## 2022-11-05 NOTE — Plan of Care (Signed)

## 2022-11-06 NOTE — Plan of Care (Signed)
  Problem: Education: Goal: Knowledge of General Education information will improve Description Including pain rating scale, medication(s)/side effects and non-pharmacologic comfort measures Outcome: Progressing   

## 2022-11-06 NOTE — Progress Notes (Signed)
PROGRESS NOTE    Carmen Daniels  UEA:[id number] DOB: 11-12-1985 DOA: 10/14/2022 PCP: Salli Real, MD   Brief Narrative: 37 year old with medical history significant for bipolar, substance abuse disorder, prior overdose and questionable suicidal ideation at outside facility.  She presents to the ED after being found down by family.  Nearby there was noted  vomit and  multiple medications.  Estimated downtime approximately 20 hours.  Patient was admitted with concern of overdose and subsequent withdrawal, hospitalist was called for admission.  On admission she was found to have acute toxic metabolic encephalopathy and severe rhabdomyolysis.  She was started on aggressive IV fluids.  Subsequently rhabdo resolved.  She was evaluated by psychiatry who initially recommended inpatient psych admission.  Reevaluated by psych on 9/3 patient does not require inpatient psych admission, they recommend home health PT or skilled rehab.  She will complete 10  days of doxycycline for multiple sites of small areas of cellulitis.  There was concern for right lower extremity peroneal nerve palsy.  AFO boot ordered.  Awaiting safe disposition.    Assessment & Plan:   Acute toxic encephalopathy, POA History of substance abuse disorder Concern for overdose, accidental versus intentional Toxic encephalopathy due to multidrug overdose. She has been cleared by psychiatric for outpatient care Patient is back to baseline.  Rhabdomyolysis, nontraumatic CK level normalized.  Treated with IV fluids Resolved.   Right lower extremity erythema mild fluid-filled lesion improving Lower extremity palsy, right lower extremity peroneal palsy compression Patient with neuropathic pain, continue with Cymbalta and Neurontin Patient completed course of doxycycline.   MRI of the Lumbar Spine showed Chronic Lumbar arthropathy She might benefit from outpatient EMG. Patient to use AFO She has been seen previously by Dr. Susa Simmonds for  bimalleolar fracture December 2023. Appreciate ortho evaluation. ABI normal.   Wound left lower extremity She does have a wound around her left knee area.  Continue with wound care with antibacterial ointment twice a day.  No indication for systemic antibiotics.  WBC is normal.  Patient was reassured.  Tachycardia: resolved.  D dimer elevated. CTA chest negative for PE, Doppler negative for DVT  Hypokalemia/hypophosphatemia/hypomagnesemia Replaced.  Labs are stable this morning.  Lactic Acidosis  Resolved with fluids  Diarrhea Improved with probiotics.  Bipolar disorder, borderline personality disorder: Trazodone as needed, on Caplyta  Migraine On imiprex  DVT prophylaxis: Lovenox Code Status: Full code Family Communication: Care discussed with patient  Disposition Plan: [facility]  The earliest they will have a place for her is on Thursday 9/19 at 11 AM.  She does not have anyone else she can stay with in the meantime.     Consultants:  psych   Subjective: No complaints offered.   Objective: Vitals:   11/05/22 1435 11/05/22 1930 11/06/22 0444 11/06/22 0726  BP: 117/74 116/81 118/66 128/83  Pulse: (!) 110 100 98 (!) 105  Resp: 18 16 16 16   Temp: 98.1 F (36.7 C) 98.4 F (36.9 C) 98.2 F (36.8 C) 98.1 F (36.7 C)  TempSrc: Oral Oral Oral Oral  SpO2:  99% 100% 100%  Weight:      Height:        Intake/Output Summary (Last 24 hours) at 11/06/2022 1001 Last data filed at 11/05/2022 2130 Gross per 24 hour  Intake 250 ml  Output --  Net 250 ml      Filed Weights   10/15/22 0223  Weight: 79.3 kg  Examination:  Awake alert.  In no distress.   Data Reviewed:   CBC: Recent Labs  Lab 11/05/22 0343  WBC 6.0  HGB 12.9  HCT 38.9  MCV 87.4  PLT 226   Basic Metabolic Panel: Recent Labs  Lab 11/05/22 0343  NA 137  K 3.7  CL 104  CO2 26  GLUCOSE 100*  BUN 5*  CREATININE 0.69   CALCIUM 8.8*  MG 1.9   GFR: Estimated Creatinine Clearance: 106.6 mL/min (by C-G formula based on SCr of 0.69 mg/dL).    Radiology Studies: No results found.      Scheduled Meds:  Chlorhexidine Gluconate Cloth  6 each Topical Q0600   vitamin B-12  500 mcg Oral Daily   DULoxetine  120 mg Oral Daily   enoxaparin (LOVENOX) injection  40 mg Subcutaneous Q24H   feeding supplement  237 mL Oral BID BM   gabapentin  300 mg Oral TID   lumateperone tosylate  42 mg Oral Daily   mupirocin ointment   Nasal BID   neomycin-bacitracin-polymyxin   Topical BID   nicotine  21 mg Transdermal Daily   potassium chloride  40 mEq Oral Daily   saccharomyces boulardii  250 mg Oral BID   sodium chloride flush  10-40 mL Intracatheter Q12H   Continuous Infusions:     LOS: 22 days   This is a no charge note.  Osvaldo Shipper, MD [facility]   If 7PM-7AM, please contact night-coverage [email or web address]  11/06/2022, 10:01 AM

## 2022-11-06 NOTE — Plan of Care (Signed)

## 2022-11-06 NOTE — Progress Notes (Signed)
Mobility Specialist: Progress Note   11/06/22 1535  Mobility  Activity Ambulated with assistance in hallway  Level of Assistance Standby assist, set-up cues, supervision of patient - no hands on  Assistive Device Front wheel walker  Distance Ambulated (ft) 100 ft  Activity Response Tolerated well  Mobility Referral Yes  $Mobility charge 1 Mobility  Mobility Specialist Start Time (ACUTE ONLY) 1346  Mobility Specialist Stop Time (ACUTE ONLY) 1352  Mobility Specialist Time Calculation (min) (ACUTE ONLY) 6 min    Pt was agreeable to mobility session - received in bed. Took 1x seated break d/t dizziness, ear ringing, and vision issues. Pt tolerance has improved as she is able to walk a longer distance before needing to take a break. Returned to room without fault. Left in bed with all needs met, call bell in reach.   Maurene Capes Mobility Specialist Please contact via SecureChat or Rehab office at [phone]

## 2022-11-06 NOTE — Plan of Care (Signed)

## 2022-11-07 MED ORDER — SUMATRIPTAN SUCCINATE 100 MG PO TABS
100.0000 mg | ORAL_TABLET | Freq: Once | ORAL | Status: AC
  Administered 2022-11-07: 100 mg via ORAL
  Filled 2022-11-07: qty 1

## 2022-11-07 NOTE — Progress Notes (Signed)
Mobility Specialist: Progress Note   11/07/22 1608  Mobility  Activity Ambulated with assistance in hallway  Level of Assistance Standby assist, set-up cues, supervision of patient - no hands on  Assistive Device Front wheel walker  Distance Ambulated (ft) 150 ft  Activity Response Tolerated well  Mobility Referral Yes  $Mobility charge 1 Mobility  Mobility Specialist Start Time (ACUTE ONLY) 1523  Mobility Specialist Stop Time (ACUTE ONLY) 1531  Mobility Specialist Time Calculation (min) (ACUTE ONLY) 8 min    Pt was agreeable to mobility session - received in bed. Pt was able to complete a longer bout of ambulation before taking 1x seated break (~1 min). Had c/o of dizziness but stated it was not as bad today; no complaints of ear ringing or vision issues. Returned to room without fault. Left in bed with all needs met, call bell in reach.   Maurene Capes Mobility Specialist Please contact via SecureChat or Rehab office at [phone]

## 2022-11-07 NOTE — Progress Notes (Signed)
Midline I.V access removed per order. Pt. tolerated well no c/o of pain,catheter intact. Agreeable to no peripheral IV access at this time. MD Rito Ehrlich

## 2022-11-07 NOTE — Progress Notes (Signed)
PROGRESS NOTE    Carmen Daniels  GUY:[id number] DOB: 1986-01-26 DOA: 10/14/2022 PCP: Salli Real, MD   Brief Narrative: 37 year old with medical history significant for bipolar, substance abuse disorder, prior overdose and questionable suicidal ideation at outside facility.  She presents to the ED after being found down by family.  Nearby there was noted  vomit and  multiple medications.  Estimated downtime approximately 20 hours.  Patient was admitted with concern of overdose and subsequent withdrawal, hospitalist was called for admission.  On admission she was found to have acute toxic metabolic encephalopathy and severe rhabdomyolysis.  She was started on aggressive IV fluids.  Subsequently rhabdo resolved.  She was evaluated by psychiatry who initially recommended inpatient psych admission.  Reevaluated by psych on 9/3 patient does not require inpatient psych admission, they recommend home health PT or skilled rehab.  She will complete 10  days of doxycycline for multiple sites of small areas of cellulitis.  There was concern for right lower extremity peroneal nerve palsy.  AFO boot ordered.  Awaiting safe disposition.    Assessment & Plan:   Acute toxic encephalopathy, POA History of substance abuse disorder Concern for overdose, accidental versus intentional Toxic encephalopathy due to multidrug overdose. She has been cleared by psychiatric for outpatient care Patient is back to baseline.  Rhabdomyolysis, nontraumatic CK level normalized.  Treated with IV fluids Resolved.   Right lower extremity erythema mild fluid-filled lesion improving Lower extremity palsy, right lower extremity peroneal palsy compression Patient with neuropathic pain, continue with Cymbalta and Neurontin Patient completed course of doxycycline.   MRI of the Lumbar Spine showed Chronic Lumbar arthropathy She might benefit from outpatient EMG. Patient to use AFO She has been seen previously by Dr. Susa Simmonds for  bimalleolar fracture December 2023. Appreciate ortho evaluation. ABI normal.   Wound left lower extremity She does have a wound around her left knee area.  Continue with wound care with antibacterial ointment twice a day.  No indication for systemic antibiotics.   Wound was reevaluated today and noted to be stable. She is noted to be afebrile and had normal WBC when last checked on 11/05/22.  Tachycardia: resolved.  D dimer elevated. CTA chest negative for PE, Doppler negative for DVT  Hypokalemia/hypophosphatemia/hypomagnesemia Replaced.  Labs are stable this morning.  Lactic Acidosis  Resolved with fluids  Diarrhea Improved with probiotics.  Bipolar disorder, borderline personality disorder: Trazodone as needed, on Caplyta  Migraine On imiprex  DVT prophylaxis: Lovenox Code Status: Full code Family Communication: Care discussed with patient  Disposition Plan: Patient [facility]  The earliest they will have a place for her is on Thursday 9/19 at 11 AM.  She does not have anyone else she can stay with in the meantime.     Consultants:  psych   Subjective: No complaints offered.   Objective: Vitals:   11/06/22 1401 11/06/22 2014 11/07/22 0456 11/07/22 0724  BP: 118/67 129/69 (!) 119/52 120/82  Pulse: (!) 107 99 70 95  Resp: 20 16 16 18   Temp: 98.8 F (37.1 C) 98.3 F (36.8 C) 98.2 F (36.8 C) 98.5 F (36.9 C)  TempSrc: Oral Oral Oral Oral  SpO2: 99% 99% 100% 99%  Weight:      Height:        Intake/Output Summary (Last 24 hours) at 11/07/2022 0944 Last data filed at 11/06/2022 2139 Gross per 24 hour  Intake 10 ml  Output --  Net 10  ml      Filed Weights   10/15/22 0223  Weight: 79.3 kg    Examination:  Awake alert.  In no distress. Wound in the left lower extremity is stable.  No worsening erythema.  No increased drainage.   Data Reviewed:   CBC: Recent Labs  Lab 11/05/22 0343  WBC  6.0  HGB 12.9  HCT 38.9  MCV 87.4  PLT 226   Basic Metabolic Panel: Recent Labs  Lab 11/05/22 0343  NA 137  K 3.7  CL 104  CO2 26  GLUCOSE 100*  BUN 5*  CREATININE 0.69  CALCIUM 8.8*  MG 1.9   GFR: Estimated Creatinine Clearance: 106.6 mL/min (by C-G formula based on SCr of 0.69 mg/dL).    Radiology Studies: No results found.      Scheduled Meds:  Chlorhexidine Gluconate Cloth  6 each Topical Q0600   vitamin B-12  500 mcg Oral Daily   DULoxetine  120 mg Oral Daily   enoxaparin (LOVENOX) injection  40 mg Subcutaneous Q24H   feeding supplement  237 mL Oral BID BM   gabapentin  300 mg Oral TID   lumateperone tosylate  42 mg Oral Daily   mupirocin ointment   Nasal BID   neomycin-bacitracin-polymyxin   Topical BID   nicotine  21 mg Transdermal Daily   potassium chloride  40 mEq Oral Daily   saccharomyces boulardii  250 mg Oral BID   sodium chloride flush  10-40 mL Intracatheter Q12H   Continuous Infusions:     LOS: 23 days   This is a no charge note.  Osvaldo Shipper, MD [facility]   If 7PM-7AM, please contact night-coverage [email or web address]  11/07/2022, 9:44 AM

## 2022-11-07 NOTE — Plan of Care (Signed)
Problem: Education: Goal: Knowledge of General Education information will improve Description Including pain rating scale, medication(s)/side effects and non-pharmacologic comfort measures Outcome: Progressing

## 2022-11-07 NOTE — Plan of Care (Signed)

## 2022-11-08 MED ORDER — SUMATRIPTAN SUCCINATE 100 MG PO TABS
100.0000 mg | ORAL_TABLET | Freq: Once | ORAL | Status: AC
  Administered 2022-11-08: 100 mg via ORAL
  Filled 2022-11-08: qty 1

## 2022-11-08 MED ORDER — MEDIHONEY WOUND/BURN DRESSING EX PSTE
1.0000 | PASTE | Freq: Every day | CUTANEOUS | Status: DC
  Administered 2022-11-08 – 2022-11-11 (×3): 1 via TOPICAL
  Filled 2022-11-08: qty 44

## 2022-11-08 MED ORDER — METOPROLOL TARTRATE 12.5 MG HALF TABLET
12.5000 mg | ORAL_TABLET | Freq: Once | ORAL | Status: AC
  Administered 2022-11-08: 12.5 mg via ORAL
  Filled 2022-11-08: qty 1

## 2022-11-08 NOTE — Plan of Care (Signed)
  Problem: Education: Goal: Knowledge of General Education information will improve Description Including pain rating scale, medication(s)/side effects and non-pharmacologic comfort measures Outcome: Progressing   

## 2022-11-08 NOTE — Consult Note (Signed)
WOC Nurse Consult Note: Reason for Consult: Consult requested for left knee.  Performed remotely after review of progress notes and photos in the EMR. Left knee with full thickness wound, 100% tightly adhered eschar.  Dressing procedure/placement/frequency: Topical treatment orders provided for bedside nurses to perform as follows to assist with removal of nonviable tissue: Apply Medihoney to left knee Q day, then cover with foam dressing.  Change foam dressing Q 3 days or PRN soiling.  Please re-consult if further assistance is needed.  Thank-you,  Cammie Mcgee MSN, RN, CWOCN, [place], CNS [phone]

## 2022-11-08 NOTE — TOC Progression Note (Signed)
Transition of Care [facility]) - Progression Note    Patient Details  Name: Carmen Daniels MRN: [id number] Date of Birth: 1985-07-05  [facility]) CM/SW Contact  Epifanio Lesches, RN Phone Number: 11/08/2022, 3:57 PM  Clinical Narrative:    Pt without family /friend support for housing. [facility] at d/c. Currently  [facility] without bed availability until Thursday 9/19.   TOC team monitoring and will assist with need.  Expected Discharge Plan: Home/Self Care ([facility]/ substance abuse center) Barriers to Discharge: Other (must enter comment) (no bed offer)  Expected Discharge Plan and Services In-house Referral: Clinical Social Work   Post Acute Care Choice: IP Rehab Living arrangements for the past 2 months: Single Family Home Expected Discharge Date: 10/22/22                                     Social Determinants of Health (SDOH) Interventions SDOH Screenings   Food Insecurity: No Food Insecurity (10/15/2022)  Housing: Low Risk  (10/15/2022)  Transportation Needs: No Transportation Needs (10/15/2022)  Utilities: Not At Risk (10/15/2022)  Alcohol Screen: Low Risk  (04/10/2017)  Depression (PHQ2-9): High Risk (08/04/2022)  Financial Resource Strain: Medium Risk (05/07/2022)   Received from [facility], [facility]  Social Connections: Unknown (06/24/2021)   Received from [facility], [facility]  Stress: Stress Concern Present (05/07/2022)   Received from [facility], [facility]  Tobacco Use: High Risk (10/15/2022)    Readmission Risk Interventions     No data to display

## 2022-11-08 NOTE — Progress Notes (Signed)
PROGRESS NOTE    Carmen Daniels  LKG:[id number] DOB: [birth year] DOA: 10/14/2022 PCP: Salli Real, MD   Brief Narrative: 37 year old with medical history significant for bipolar, substance abuse disorder, prior overdose and questionable suicidal ideation at outside facility.  She presents to the ED after being found down by family.  Nearby there was noted  vomit and  multiple medications.  Estimated downtime approximately 20 hours.  Patient was admitted with concern of overdose and subsequent withdrawal, hospitalist was called for admission.  On admission she was found to have acute toxic metabolic encephalopathy and severe rhabdomyolysis.  She was started on aggressive IV fluids.  Subsequently rhabdo resolved.  She was evaluated by psychiatry who initially recommended inpatient psych admission.  Reevaluated by psych on 9/3 patient does not require inpatient psych admission, they recommend home health PT or skilled rehab.  She will complete 10  days of doxycycline for multiple sites of small areas of cellulitis.  There was concern for right lower extremity peroneal nerve palsy.  AFO boot ordered.  Awaiting safe disposition.    Assessment & Plan:   Acute toxic encephalopathy, POA History of substance abuse disorder Concern for overdose, accidental versus intentional Toxic encephalopathy due to multidrug overdose. She has been cleared by psychiatric for outpatient care Patient is back to baseline.  Rhabdomyolysis, nontraumatic CK level normalized.  Treated with IV fluids Resolved.   Right lower extremity erythema mild fluid-filled lesion improving Lower extremity palsy, right lower extremity peroneal palsy compression Patient with neuropathic pain, continue with Cymbalta and Neurontin Patient completed course of doxycycline.   MRI of the Lumbar Spine showed Chronic Lumbar arthropathy She might benefit from outpatient EMG. Patient to use AFO She has been seen previously by Dr. Susa Simmonds for  bimalleolar fracture December 2023. Appreciate ortho evaluation. ABI normal.   Wound left lower extremity She does have a wound around her left knee area.  Continue with wound care with antibacterial ointment twice a day.  No indication for systemic antibiotics.   Wound was reevaluated yesterday and noted to be stable. She is noted to be afebrile and had normal WBC when last checked on 11/05/22. Seen by wound care nurse this morning.  Tachycardia: resolved.  D dimer elevated. CTA chest negative for PE, Doppler negative for DVT  Hypokalemia/hypophosphatemia/hypomagnesemia Replaced.    Lactic Acidosis  Resolved with fluids  Diarrhea Improved with probiotics.  Bipolar disorder, borderline personality disorder: Trazodone as needed, on Caplyta  Migraine On imiprex  DVT prophylaxis: Lovenox Code Status: Full code Family Communication: Care discussed with patient  Disposition Plan: Patient has been [facility] by [facility]  The earliest they will have a place for her is on Thursday 9/19 at 11 AM.  She does not have anyone else she can stay with in the meantime.     Consultants:  psych   Subjective: Patient feels well.  Wound in the left leg is improving.  Denies any pain.   Objective: Vitals:   11/07/22 1215 11/07/22 1935 11/08/22 0355 11/08/22 0747  BP: 118/74 113/74 123/69 114/80  Pulse:  (!) 103 88 (!) 105  Resp: 17  17   Temp: 98.3 F (36.8 C) 98 F (36.7 C) 98.3 F (36.8 C) (!) 97.5 F (36.4 C)  TempSrc: Oral Oral Oral Oral  SpO2: 99% 100% 100% 100%  Weight:      Height:       No intake or output data in the 24 hours ending 11/08/22 1018  Filed Weights   10/15/22 0223  Weight: 79.3 kg    Examination:  Awake alert.  In no distress.  Data Reviewed:   CBC: Recent Labs  Lab 11/05/22 0343  WBC 6.0  HGB 12.9  HCT 38.9  MCV 87.4  PLT 226   Basic Metabolic Panel: Recent Labs  Lab 11/05/22 0343  NA 137   K 3.7  CL 104  CO2 26  GLUCOSE 100*  BUN 5*  CREATININE 0.69  CALCIUM 8.8*  MG 1.9   GFR: Estimated Creatinine Clearance: 106.6 mL/min (by C-G formula based on SCr of 0.69 mg/dL).    Radiology Studies: No results found.      Scheduled Meds:  Chlorhexidine Gluconate Cloth  6 each Topical Q0600   vitamin B-12  500 mcg Oral Daily   DULoxetine  120 mg Oral Daily   enoxaparin (LOVENOX) injection  40 mg Subcutaneous Q24H   feeding supplement  237 mL Oral BID BM   gabapentin  300 mg Oral TID   leptospermum manuka honey  1 Application Topical Daily   lumateperone tosylate  42 mg Oral Daily   mupirocin ointment   Nasal BID   neomycin-bacitracin-polymyxin   Topical BID   nicotine  21 mg Transdermal Daily   potassium chloride  40 mEq Oral Daily   saccharomyces boulardii  250 mg Oral BID   sodium chloride flush  10-40 mL Intracatheter Q12H   Continuous Infusions:     LOS: 24 days   This is a no charge note.  Osvaldo Shipper, MD [facility]   If 7PM-7AM, please contact night-coverage [email or web address]  11/08/2022, 10:18 AM

## 2022-11-08 NOTE — Progress Notes (Signed)
Physical Therapy Treatment Patient Details Name: Carmen Daniels MRN: [id number] DOB: [birth year] Today's Date: 11/08/2022   History of Present Illness Pt is 37 yo presenting to [facility] ED [facility]/22 after being found down by family near vomit and multiple medications. Pt was found to have acute toxic metabolic encephalopathy and severe rhabdomyolysis. PMH: bipolar, substance abuse disorder, fibromyalgia, schizoaffective disorder, BPD, asthma, OCD, prior overdose and questionable suicidal ideation at outside facility.    PT Comments  Pt is asleep on entry, but wakes easily and is agreeable to therapy. Pt reports needing to use bathroom prior to ambulation. Pt is mod I for bed mobility, and supervision for transfers from low bed, toilet and wheelchair. Pt able to progress her ambulation despite complaints of dizziness and ear ringing and needing seated rest breaks. D/c to drug rehab remains the most appropriate discharge plan. PT will continue to follow acutely.     If plan is discharge home, recommend the following: Help with stairs or ramp for entrance;A little help with walking and/or transfers;Assistance with cooking/housework;Assist for transportation   Can travel by private vehicle      Yes  Equipment Recommendations  None recommended by PT (Friend states today that she can grab needed DME from house.)       Precautions / Restrictions Precautions Precautions: Fall Precaution Comments: R drop foot Required Braces or Orthoses: Other Brace Other Brace: R CAM boot Restrictions Weight Bearing Restrictions: No     Mobility  Bed Mobility Overal bed mobility: Independent             General bed mobility comments: HOB flat    Transfers Overall transfer level: Needs assistance Equipment used: Rolling walker (2 wheels) Transfers: Sit to/from Stand Sit to Stand: Supervision           General transfer comment: good hand placement, power up and self steadying in RW and from toilet to  standing with use of grab bars    Ambulation/Gait Ambulation/Gait assistance: Supervision Gait Distance (Feet): 200 Feet Assistive device: Rolling walker (2 wheels) Gait Pattern/deviations: Step-to pattern, Decreased weight shift to right, Decreased step length - left Gait velocity: decreased Gait velocity interpretation: 1.31 - 2.62 ft/sec, indicative of limited community ambulator   General Gait Details: pt with dizziness after using bathroom and request PT follow with the wheelchair, ambulates 50 feet takes seated rest break in wheelchair x2, reports feeling stronger and is able to walk 100 feet back to bed without stopping, including 1x decreased R foot clearance catching toe of CAM boot but able to continue gait without LOB       Balance Overall balance assessment: Needs assistance Sitting-balance support: No upper extremity supported, Feet supported Sitting balance-Leahy Scale: Good Sitting balance - Comments: able to perform dressing seated on EOB   Standing balance support: Bilateral upper extremity supported, During functional activity Standing balance-Leahy Scale: Fair Standing balance comment: releases walker in static standing without LOB at sink                            Cognition Arousal: Alert Behavior During Therapy: WFL for tasks assessed/performed, Flat affect Overall Cognitive Status: Within Functional Limits for tasks assessed                                 General Comments: continues to be very flat, able to self motivate today to overcome painful  situation           General Comments General comments (skin integrity, edema, etc.): VSS on RA      Pertinent Vitals/Pain Pain Assessment Pain Assessment: Faces Faces Pain Scale: Hurts a little bit Pain Location: L knee wound Pain Descriptors / Indicators: Burning, Discomfort, Grimacing, Guarding Pain Intervention(s): Limited activity within patient's tolerance, Monitored during  session, Repositioned     PT Goals (current goals can now be found in the care plan section) Acute Rehab PT Goals Patient Stated Goal: to go to rehab PT Goal Formulation: With patient Time For Goal Achievement: 11/04/22 Potential to Achieve Goals: Good Progress towards PT goals: Progressing toward goals    Frequency    Min 1X/week       AM-PAC PT "6 Clicks" Mobility   Outcome Measure  Help needed turning from your back to your side while in a flat bed without using bedrails?: None Help needed moving from lying on your back to sitting on the side of a flat bed without using bedrails?: None Help needed moving to and from a bed to a chair (including a wheelchair)?: A Little Help needed standing up from a chair using your arms (e.g., wheelchair or bedside chair)?: A Little Help needed to walk in hospital room?: A Little Help needed climbing 3-5 steps with a railing? : A Lot 6 Click Score: 19    End of Session Equipment Utilized During Treatment: Other (comment) (CAM boot) Activity Tolerance: Patient tolerated treatment well Patient left: with call bell/phone within reach;in chair;with chair alarm set Nurse Communication: Mobility status PT Visit Diagnosis: Unsteadiness on feet (R26.81);Other abnormalities of gait and mobility (R26.89)     Time: 4098-1191 PT Time Calculation (min) (ACUTE ONLY): 21 min  Charges:    $Therapeutic Activity: 8-22 mins PT General Charges $$ ACUTE PT VISIT: 1 Visit                     Charnese Federici B. Beverely Risen PT, DPT Acute Rehabilitation Services Please use secure chat or  Call Office [phone]    [facility] 11/08/2022, 1:31 PM

## 2022-11-09 DIAGNOSIS — R000 Tachycardia, unspecified: Secondary | ICD-10-CM

## 2022-11-09 DIAGNOSIS — F419 Anxiety disorder, unspecified: Secondary | ICD-10-CM

## 2022-11-09 MED ORDER — ALPRAZOLAM 0.25 MG PO TABS: 0.2500 mg | ORAL_TABLET | Freq: Two times a day (BID) | ORAL | Status: DC | PRN

## 2022-11-09 NOTE — Progress Notes (Signed)
PROGRESS NOTE    Carmen Daniels  UVO:[id number] DOB: [birth year] DOA: 10/14/2022 PCP: Salli Real, MD   Brief Narrative: 37 year old with medical history significant for bipolar, substance abuse disorder, prior overdose and questionable suicidal ideation at outside facility.  She presents to the ED after being found down by family.  Nearby there was noted  vomit and  multiple medications.  Estimated downtime approximately 20 hours.  Patient was admitted with concern of overdose and subsequent withdrawal, hospitalist was called for admission.  On admission she was found to have acute toxic metabolic encephalopathy and severe rhabdomyolysis.  She was started on aggressive IV fluids.  Subsequently rhabdo resolved.  She was evaluated by psychiatry who initially recommended inpatient psych admission.  Reevaluated by psych on 9/3 patient does not require inpatient psych admission, they recommend home health PT or skilled rehab.  She will complete 10  days of doxycycline for multiple sites of small areas of cellulitis.  There was concern for right lower extremity peroneal nerve palsy.  AFO boot ordered.  Awaiting safe disposition.    Assessment & Plan:   Acute toxic encephalopathy, POA History of substance abuse disorder Concern for overdose, accidental versus intentional Toxic encephalopathy due to multidrug overdose. She has been cleared by psychiatric for outpatient care Patient is back to baseline.  Rhabdomyolysis, nontraumatic CK level normalized.  Treated with IV fluids Resolved.   Right lower extremity erythema mild fluid-filled lesion improving Lower extremity palsy, right lower extremity peroneal palsy compression Patient with neuropathic pain, continue with Cymbalta and Neurontin Patient completed course of doxycycline.   MRI of the Lumbar Spine showed Chronic Lumbar arthropathy She might benefit from outpatient EMG. Patient to use AFO She has been seen previously by Dr. Susa Simmonds for  bimalleolar fracture December 2023. Appreciate ortho evaluation. ABI normal.   Wound left lower extremity She does have a wound around her left knee area.  Continue with wound care with antibacterial ointment twice a day.  No indication for systemic antibiotics.   Wound was reevaluated yesterday and noted to be stable. She is noted to be afebrile and had normal WBC when last checked on 11/05/22. Seen by wound care nurse this morning.  Tachycardia: resolved.  D dimer elevated. CTA chest negative for PE, Doppler negative for DVT Tachycardic yesterday due to anxiety.  And was given 1 dose of metoprolol orally.  Heart rate is back to normal now.  Hypokalemia/hypophosphatemia/hypomagnesemia Replaced.    Lactic Acidosis  Resolved with fluids  Diarrhea Improved with probiotics.  Bipolar disorder, borderline personality disorder: Trazodone as needed, on Caplyta Became very anxious yesterday resulting in tachycardia.  Reports a lot of anxiety this morning.  She is already on Cymbalta.  Will add low-dose alprazolam for a day or 2.  Check TSH.  Will also check CBC and metabolic panel tomorrow.  Migraine On imiprex  DVT prophylaxis: Lovenox Code Status: Full code Family Communication: Care discussed with patient  Disposition Plan: [facility]  The earliest they will have a place for her is on Thursday 9/19 at 11 AM.  She does not have anyone else she can stay with in the meantime.     Consultants:  psych   Subjective: Complains of anxiety.  No other issues reported.   Objective: Vitals:   11/08/22 1715 11/08/22 2012 11/09/22 0444 11/09/22 0745  BP: 109/66 117/75 116/67 129/86  Pulse: 85 79 79 98  Resp:  16 15   Temp:  98.3 F (36.8 C) 98.4 F (36.9 C) 97.6 F (36.4 C)  TempSrc:  Oral Oral Oral  SpO2: 100% 100% 100% 100%  Weight:      Height:        Intake/Output Summary (Last 24 hours) at 11/09/2022  0953 Last data filed at 11/09/2022 0300 Gross per 24 hour  Intake 480 ml  Output --  Net 480 ml       Filed Weights   10/15/22 0223  Weight: 79.3 kg    Examination:  Awake alert.  In no distress. Lungs are clear to auscultation bilaterally. S1-S2 is normal regular. Abdomen is soft.  Data Reviewed:   CBC: Recent Labs  Lab 11/05/22 0343  WBC 6.0  HGB 12.9  HCT 38.9  MCV 87.4  PLT 226   Basic Metabolic Panel: Recent Labs  Lab 11/05/22 0343  NA 137  K 3.7  CL 104  CO2 26  GLUCOSE 100*  BUN 5*  CREATININE 0.69  CALCIUM 8.8*  MG 1.9   GFR: Estimated Creatinine Clearance: 106.6 mL/min (by C-G formula based on SCr of 0.69 mg/dL).    Radiology Studies: No results found.      Scheduled Meds:  Chlorhexidine Gluconate Cloth  6 each Topical Q0600   vitamin B-12  500 mcg Oral Daily   DULoxetine  120 mg Oral Daily   enoxaparin (LOVENOX) injection  40 mg Subcutaneous Q24H   feeding supplement  237 mL Oral BID BM   gabapentin  300 mg Oral TID   leptospermum manuka honey  1 Application Topical Daily   lumateperone tosylate  42 mg Oral Daily   mupirocin ointment   Nasal BID   neomycin-bacitracin-polymyxin   Topical BID   nicotine  21 mg Transdermal Daily   potassium chloride  40 mEq Oral Daily   saccharomyces boulardii  250 mg Oral BID   sodium chloride flush  10-40 mL Intracatheter Q12H   Continuous Infusions:     LOS: 25 days    Osvaldo Shipper, MD [facility]   If 7PM-7AM, please contact night-coverage [email or web address]  11/09/2022, 9:53 AM

## 2022-11-09 NOTE — Progress Notes (Signed)
Occupational Therapy Treatment Patient Details Name: Carmen Daniels MRN: [id number] DOB: 27-Jun-1985 Today's Date: 11/09/2022   History of present illness Pt is 37 yo presenting to [facility] ED 8/22 after being found down by family near vomit and multiple medications. Pt was found to have acute toxic metabolic encephalopathy and severe rhabdomyolysis. PMH: bipolar, substance abuse disorder, fibromyalgia, schizoaffective disorder, BPD, asthma, OCD, prior overdose and questionable suicidal ideation at outside facility.   OT comments  Pt at this time was in bed but agreeable to session. Pt completed bed mobility independently, ambulation to toilet with RW and toileting tasks with distant supervision. Pt then reported to complete light hygiene requested to complete in a seated position at sink due to pain but was able to operate WC and complete at this level. Pt then was able to operate WC with no cues. [facility] signing off. Thank you.       If plan is discharge home, recommend the following:  A little help with walking and/or transfers;A little help with bathing/dressing/bathroom   Equipment Recommendations  None recommended by OT    Recommendations for Other Services      Precautions / Restrictions Precautions Precautions: Fall Precaution Comments: R drop foot Required Braces or Orthoses: Other Brace Other Brace: R CAM boot Restrictions Weight Bearing Restrictions: No       Mobility Bed Mobility Overal bed mobility: Independent                  Transfers Overall transfer level: Needs assistance Equipment used: Rolling walker (2 wheels) Transfers: Sit to/from Stand Sit to Stand: Supervision Stand pivot transfers: Supervision               Balance Overall balance assessment: Needs assistance Sitting-balance support: No upper extremity supported, Feet supported Sitting balance-Leahy Scale: Good     Standing balance support: Bilateral upper extremity  supported, No upper extremity supported Standing balance-Leahy Scale: Good Standing balance comment: no LOB and able to complete LB dressing                           ADL either performed or assessed with clinical judgement   ADL Overall ADL's : Needs assistance/impaired Eating/Feeding: Independent;Sitting   Grooming: Wash/dry hands;Wash/dry face;Set up;Sitting   Upper Body Bathing: Independent;Sitting   Lower Body Bathing: Sit to/from stand;Independent   Upper Body Dressing : Independent;Sitting   Lower Body Dressing: Independent;Sit to/from stand   Toilet Transfer: Supervision/safety;Rolling walker (2 wheels);BSC/3in1   Toileting- Architect and Hygiene: Supervision/safety;Sit to/from stand       Functional mobility during ADLs: Supervision/safety;Rolling walker (2 wheels) General ADL Comments: Pt at this time requested to complete WC mobility due to pain levels    Extremity/Trunk Assessment Upper Extremity Assessment Upper Extremity Assessment: Overall WFL for tasks assessed   Lower Extremity Assessment Lower Extremity Assessment: Defer to PT evaluation        Vision       Perception     Praxis      Cognition Arousal: Alert Behavior During Therapy: Advanced Pain Management for tasks assessed/performed, Flat affect Overall Cognitive Status: Within Functional Limits for tasks assessed                                          Exercises      Shoulder Instructions  General Comments      Pertinent Vitals/ Pain       Pain Assessment Pain Assessment: Faces Faces Pain Scale: Hurts little more Breathing: normal Negative Vocalization: occasional moan/groan, low speech, negative/disapproving quality Facial Expression: smiling or inexpressive Body Language: relaxed Consolability: no need to console PAINAD Score: 1 Pain Location: RLE Pain Descriptors / Indicators: Discomfort Pain Intervention(s): Limited activity within patient's  tolerance, Monitored during session, Repositioned  Home Living                                          Prior Functioning/Environment              Frequency  Min 1X/week        Progress Toward Goals  OT Goals(current goals can now be found in the care plan section)  Progress towards OT goals: Goals met/education completed, patient discharged from OT  Acute Rehab OT Goals OT Goal Formulation: With patient Time For Goal Achievement: 11/18/22 Potential to Achieve Goals: Good ADL Goals Pt Will Perform Lower Body Bathing: with modified independence;sit to/from stand Pt Will Perform Lower Body Dressing: with modified independence;sit to/from stand Pt Will Transfer to Toilet: with modified independence;ambulating;regular height toilet Pt Will Perform Toileting - Clothing Manipulation and hygiene: with modified independence;sitting/lateral leans;sit to/from stand Additional ADL Goal #1: Pt will gather items necessary for ADLs around her room modified independently.  Plan      Co-evaluation                 AM-PAC OT "6 Clicks" Daily Activity     Outcome Measure   Help from another person eating meals?: None Help from another person taking care of personal grooming?: None Help from another person toileting, which includes using toliet, bedpan, or urinal?: None Help from another person bathing (including washing, rinsing, drying)?: None Help from another person to put on and taking off regular upper body clothing?: None Help from another person to put on and taking off regular lower body clothing?: None 6 Click Score: 24    End of Session Equipment Utilized During Treatment: Gait belt;Rolling walker (2 wheels)  OT Visit Diagnosis: Unsteadiness on feet (R26.81);Muscle weakness (generalized) (M62.81)   Activity Tolerance Patient tolerated treatment well   Patient Left in bed;with call bell/phone within reach   Nurse Communication Mobility status  (pain)        Time: 4098-1191 OT Time Calculation (min): 14 min  Charges: OT General Charges $OT Visit: 1 Visit OT Treatments $Self Care/Home Management : 8-22 mins  Presley Raddle OTR/L  Acute Rehab Services  [phone] office number   Alphia Moh 11/09/2022, 9:56 AM

## 2022-11-09 NOTE — Progress Notes (Signed)
Mobility Specialist: Progress Note   11/09/22 1622  Mobility  Activity Ambulated with assistance in hallway  Level of Assistance Standby assist, set-up cues, supervision of patient - no hands on  Assistive Device Front wheel walker (with w/c follow)  Distance Ambulated (ft) 110 ft  Activity Response Tolerated well  Mobility Referral Yes  $Mobility charge 1 Mobility  Mobility Specialist Start Time (ACUTE ONLY) 1545  Mobility Specialist Stop Time (ACUTE ONLY) 1600  Mobility Specialist Time Calculation (min) (ACUTE ONLY) 15 min    Pt was agreeable to mobility session - received in bed. Stated her symptoms were bad earlier today when she ambulated to the bathroom. Took 1x seated break (~1 min) during session d/t dizziness and ear ringing. Returned to room without fault. Left in bed with all needs met, call bell in reach.   Carmen Daniels Mobility Specialist Please contact via SecureChat or Rehab office at [phone]

## 2022-11-10 ENCOUNTER — Telehealth: Payer: Self-pay | Admitting: Neurology

## 2022-11-10 ENCOUNTER — Other Ambulatory Visit (HOSPITAL_COMMUNITY): Payer: Self-pay

## 2022-11-10 DIAGNOSIS — M6282 Rhabdomyolysis: Secondary | ICD-10-CM | POA: Diagnosis not present

## 2022-11-10 LAB — TSH: TSH: 2.281 u[IU]/mL (ref 0.350–4.500)

## 2022-11-10 MED ORDER — SUMATRIPTAN SUCCINATE 100 MG PO TABS
ORAL_TABLET | ORAL | 11 refills | Status: DC
  Filled 2022-11-10: qty 9, 30d supply, fill #0

## 2022-11-10 MED ORDER — VITAMIN B-12 1000 MCG PO TABS
500.0000 ug | ORAL_TABLET | Freq: Every day | ORAL | 1 refills | Status: AC
  Filled 2022-11-10: qty 15, 30d supply, fill #0

## 2022-11-10 MED ORDER — TRAMADOL HCL 50 MG PO TABS
50.0000 mg | ORAL_TABLET | Freq: Two times a day (BID) | ORAL | 0 refills | Status: AC | PRN
  Filled 2022-11-10: qty 6, 3d supply, fill #0

## 2022-11-10 MED ORDER — LUMATEPERONE TOSYLATE 42 MG PO CAPS
42.0000 mg | ORAL_CAPSULE | Freq: Every day | ORAL | 0 refills | Status: DC
  Filled 2022-11-10: qty 30, 30d supply, fill #0

## 2022-11-10 MED ORDER — BACITRACIN-NEOMYCIN-POLYMYXIN OINTMENT TUBE
1.0000 | TOPICAL_OINTMENT | Freq: Two times a day (BID) | CUTANEOUS | 0 refills | Status: AC
  Filled 2022-11-10: qty 28.4, 14d supply, fill #0

## 2022-11-10 MED ORDER — SUMATRIPTAN SUCCINATE 100 MG PO TABS
100.0000 mg | ORAL_TABLET | ORAL | Status: DC | PRN
  Administered 2022-11-10: 100 mg via ORAL
  Filled 2022-11-10 (×2): qty 1

## 2022-11-10 MED ORDER — DULOXETINE HCL 60 MG PO CPEP
120.0000 mg | ORAL_CAPSULE | Freq: Every day | ORAL | 1 refills | Status: DC
Start: 2022-11-10 — End: 2022-12-24
  Filled 2022-11-10: qty 60, 30d supply, fill #0

## 2022-11-10 MED ORDER — BECLOMETHASONE DIPROP HFA 40 MCG/ACT IN AERB
1.0000 | INHALATION_SPRAY | Freq: Two times a day (BID) | RESPIRATORY_TRACT | 12 refills | Status: AC | PRN
Start: 2022-11-10 — End: ?
  Filled 2022-11-10: qty 10.6, 60d supply, fill #0

## 2022-11-10 MED ORDER — GABAPENTIN 300 MG PO CAPS
300.0000 mg | ORAL_CAPSULE | Freq: Three times a day (TID) | ORAL | 0 refills | Status: AC
  Filled 2022-11-10: qty 90, 30d supply, fill #0

## 2022-11-10 NOTE — TOC Progression Note (Signed)
Transition of Care [facility]) - Progression Note    Patient Details  Name: Carmen Daniels MRN: [id number] Date of Birth: [birth year]  Transition of Care [facility]) CM/SW Contact  Lorri Frederick, LCSW Phone Number: 11/10/2022, 8:19 AM  Clinical Narrative:   CSW spoke with admissions at [facility].  They confirmed pt is scheduled for 11am admission tomorrow, 9/19.    Expected Discharge Plan: Home/Self Care ([facility]/ substance abuse center) Barriers to Discharge: Other (must enter comment) (no bed offer)  Expected Discharge Plan and Services In-house Referral: Clinical Social Work   Post Acute Care Choice: IP Rehab Living arrangements for the past 2 months: Single Family Home Expected Discharge Date: 10/22/22                                     Social Determinants of Health (SDOH) Interventions SDOH Screenings   Food Insecurity: No Food Insecurity (10/15/2022)  Housing: Low Risk  (10/15/2022)  Transportation Needs: No Transportation Needs (10/15/2022)  Utilities: Not At Risk (10/15/2022)  Alcohol Screen: Low Risk  (04/10/2017)  Depression (PHQ2-9): High Risk (08/04/2022)  Financial Resource Strain: Medium Risk (05/07/2022)   Received from [facility], [facility]  Social Connections: Unknown (06/24/2021)   Received from [facility], [facility]  Stress: Stress Concern Present (05/07/2022)   Received from [facility], [facility]  Tobacco Use: High Risk (10/15/2022)    Readmission Risk Interventions     No data to display

## 2022-11-10 NOTE — Telephone Encounter (Signed)
Pt's mother called to cx pt's appt. Also she wanting to inform provider that the Pt is hospitalized due to a accidental drug overdose and has been in the hospital for 4 weeks and will be going into In patient Drug Rehab starting tomorrow.

## 2022-11-10 NOTE — TOC Progression Note (Signed)
Transition of Care [facility]) - Progression Note    Patient Details  Name: Carmen Daniels MRN: [id number] Date of Birth: 25-Nov-1985  Transition of Care [facility]) CM/SW Contact  Epifanio Lesches, RN Phone Number: 11/10/2022, 2:52 PM  Clinical Narrative:    Pt to d/c to [facility] on tomorrow 9/19. Taxi voucher will be provided by [facility] team for transportation to [facility]. Pt with appointment time of 11 am.  Pt without DME needs, RW/W/C @ bedside. TOC pharmacy to deliver RX meds to bedside prior to d/c . Post hospital f/u noted on AVS.  TOC team will continue to monitor for needs...   Expected Discharge Plan: Home/Self Care ([facility]) Barriers to Discharge:  (pt will d/con Thursday to [facility])  Expected Discharge Plan and Services In-house Referral: Clinical Social Work   Post Acute Care Choice: IP Rehab Living arrangements for the past 2 months: Single Family Home Expected Discharge Date: 10/22/22                                     Social Determinants of Health (SDOH) Interventions SDOH Screenings   Food Insecurity: No Food Insecurity (10/15/2022)  Housing: Low Risk  (10/15/2022)  Transportation Needs: No Transportation Needs (10/15/2022)  Utilities: Not At Risk (10/15/2022)  Alcohol Screen: Low Risk  (04/10/2017)  Depression (PHQ2-9): High Risk (08/04/2022)  Financial Resource Strain: Medium Risk (05/07/2022)   Received from [facility], [facility]  Social Connections: Unknown (06/24/2021)   Received from [facility], [facility]  Stress: Stress Concern Present (05/07/2022)   Received from [facility], [facility]  Tobacco Use: High Risk (10/15/2022)    Readmission Risk Interventions     No data to display

## 2022-11-10 NOTE — Progress Notes (Signed)
PROGRESS NOTE    Carmen Daniels  XBJ:[id number] DOB: 01-May-1985 DOA: 10/14/2022 PCP: Salli Real, MD   Brief Narrative: 37 year old with medical history significant for bipolar, substance abuse disorder, prior overdose and questionable suicidal ideation at outside facility.  She presents to the ED after being found down by family.  Nearby there was noted  vomit and  multiple medications.  Estimated downtime approximately 20 hours.  Patient was admitted with concern of overdose and subsequent withdrawal, hospitalist was called for admission.  On admission she was found to have acute toxic metabolic encephalopathy and severe rhabdomyolysis.  She was started on aggressive IV fluids.  Subsequently rhabdo resolved.  She was evaluated by psychiatry who initially recommended inpatient psych admission.  Reevaluated by psych on 9/3 patient does not require inpatient psych admission, they recommend home health PT or skilled rehab.  She completed  10  days of doxycycline for multiple sites of small areas of cellulitis.  There was concern for right lower extremity peroneal nerve palsy.  AFO boot ordered.  Patient for discharge 9/19 to [facility].      Assessment & Plan:   Principal Problem:   Rhabdomyolysis Active Problems:   AMS (altered mental status)   Non-traumatic rhabdomyolysis   Encephalopathy, toxic   Polysubstance abuse (HCC)   Severe bipolar I disorder, most recent episode depressed (HCC)   Other bipolar disorder (HCC)  1-Acute toxic encephalopathy, POA History of substance abuse disorder Concern for overdose, accidental versus intentional Toxic encephalopathy due to multidrug overdose. She has been  clear by psychiatric for outpatient care At baseline. Oriented times 3.   Bipolar disorder, borderline personality disorder: Trazodone as needed, on Caplyta Had episode of anxiety, started on short course alprazolam.  Tachycardia resolved.   Rhabdomyolysis, nontraumatic CK level normalized.   Treated with IV fluids Resolved.   -Right lower extremity erythema mild fluid-filled lesion improving Lower extremity palsy, right lower extremity peroneal palsy compression Patient with neuropathic pain, continue with Cymbalta and Neurontin Completed  10 days of doxy for multiples sites cellulitis.  MRI of the Lumbar Spine showed Chronic Lumbar arthropathy She might benefit from outpatient EMG. Patient to use AFO She has been seen previously by Dr. Susa Simmonds for bimalleolar fracture December 2023. Appreciate ortho evaluation. ABI normal.  Needs to work with PT>   Tachycardia: resolved.  Treated  with IV fluids D dimer elevated. CTA chest negative for PE, Doppler negative for DVT TSH normal.   Hypokalemia/hypophosphatemia/hypomagnesemia: Replaced  Lactic Acidosis: Resolved with fluids  Diarrhea; started  florastore, bentyl PRN. Resolved.    Migraine: resume home imiprex PRN Estimated body mass index is 24.38 kg/m as calculated from the following:   Height as of this encounter: 5\' 11"  (1.803 m).   Weight as of this encounter: 79.3 kg.   DVT prophylaxis: Lovenox Code Status: Full code Family Communication: Care discussed with patient  Disposition Plan:  Status is: Inpatient Remains inpatient appropriate because: medical stable, awaiting safe discharge    Consultants:  psych  Procedures:  none  Antimicrobials:    Subjective: No new complaints. She is read for discharge tomorrow.    Objective: Vitals:   11/09/22 1425 11/09/22 2119 11/10/22 0414 11/10/22 1329  BP: 118/76 118/75 133/81 120/80  Pulse: (!) 105 95 74   Resp:  15 16 17   Temp: 98.5 F (36.9 C) 98 F (36.7 C) 98.3 F (36.8 C) 98.2 F (36.8 C)  TempSrc: Oral Oral Oral Oral  SpO2: 99% 100% 100% 100%  Weight:  Height:        Intake/Output Summary (Last 24 hours) at 11/10/2022 1609 Last data filed at 11/10/2022 1400 Gross per 24 hour  Intake 750 ml  Output --  Net 750 ml     Filed Weights    10/15/22 0223  Weight: 79.3 kg    Examination:  General exam: NAD Respiratory system: CTA Cardiovascular system: S 1, S 2 RRR Gastrointestinal system: BS present, soft nt Central nervous system: Alert, follows command Extremities: right foot drop,  Data Reviewed: I have personally reviewed following labs and imaging studies  CBC: Recent Labs  Lab 11/05/22 0343 11/10/22 0430  WBC 6.0 5.6  HGB 12.9 12.8  HCT 38.9 38.9  MCV 87.4 89.6  PLT 226 210   Basic Metabolic Panel: Recent Labs  Lab 11/05/22 0343 11/10/22 0430  NA 137 138  K 3.7 3.9  CL 104 106  CO2 26 24  GLUCOSE 100* 100*  BUN 5* 6  CREATININE 0.69 0.55  CALCIUM 8.8* 8.6*  MG 1.9 1.8   GFR: Estimated Creatinine Clearance: 106.6 mL/min (by C-G formula based on SCr of 0.55 mg/dL). Liver Function Tests: Recent Labs  Lab 11/05/22 0343 11/10/22 0430  AST 21 18  ALT 28 27  ALKPHOS 62 70  BILITOT 0.8 0.6  PROT 5.8* 5.7*  ALBUMIN 3.1* 3.0*    No results for input(s): "LIPASE", "AMYLASE" in the last 168 hours. No results for input(s): "AMMONIA" in the last 168 hours. Coagulation Profile: No results for input(s): "INR", "PROTIME" in the last 168 hours. Cardiac Enzymes: No results for input(s): "CKTOTAL", "CKMB", "CKMBINDEX", "TROPONINI" in the last 168 hours.  BNP (last 3 results) No results for input(s): "PROBNP" in the last 8760 hours. HbA1C: No results for input(s): "HGBA1C" in the last 72 hours. CBG: No results for input(s): "GLUCAP" in the last 168 hours. Lipid Profile: No results for input(s): "CHOL", "HDL", "LDLCALC", "TRIG", "CHOLHDL", "LDLDIRECT" in the last 72 hours. Thyroid Function Tests: Recent Labs    11/10/22 0430  TSH 2.281   Anemia Panel: No results for input(s): "VITAMINB12", "FOLATE", "FERRITIN", "TIBC", "IRON", "RETICCTPCT" in the last 72 hours. Sepsis Labs: No results for input(s): "PROCALCITON", "LATICACIDVEN" in the last 168 hours.  Recent Results (from the past 240  hour(s))  MRSA Next Gen by PCR, Nasal     Status: None   Collection Time: 11/05/22 10:13 AM   Specimen: Nasal Mucosa; Nasal Swab  Result Value Ref Range Status   MRSA by PCR Next Gen NOT DETECTED NOT DETECTED Final    Comment: (NOTE) The GeneXpert MRSA Assay (FDA approved for NASAL specimens only), is one component of a comprehensive MRSA colonization surveillance program. It is not intended to diagnose MRSA infection nor to guide or monitor treatment for MRSA infections. Test performance is not FDA approved in patients less than 84 years old. Performed at [facility], [address], [place], [place] [address]          Radiology Studies: No results found.      Scheduled Meds:  Chlorhexidine Gluconate Cloth  6 each Topical Q0600   vitamin B-12  500 mcg Oral Daily   DULoxetine  120 mg Oral Daily   enoxaparin (LOVENOX) injection  40 mg Subcutaneous Q24H   feeding supplement  237 mL Oral BID BM   gabapentin  300 mg Oral TID   leptospermum manuka honey  1 Application Topical Daily   lumateperone tosylate  42 mg Oral Daily   mupirocin ointment  Nasal BID   neomycin-bacitracin-polymyxin   Topical BID   nicotine  21 mg Transdermal Daily   potassium chloride  40 mEq Oral Daily   saccharomyces boulardii  250 mg Oral BID   sodium chloride flush  10-40 mL Intracatheter Q12H   Continuous Infusions:     LOS: 26 days    Time spent: 35 minutes    Brita Jurgensen A Eleen Litz, MD [facility]   If 7PM-7AM, please contact night-coverage [email or web address]  11/10/2022, 4:09 PM

## 2022-11-10 NOTE — Progress Notes (Signed)
Physical Therapy Treatment Patient Details Name: Carmen Daniels MRN: [id number] DOB: 1985/10/20 Today's Date: 11/10/2022   History of Present Illness Pt is 37 yo presenting to [facility] ED 8/22 after being found down by family near vomit and multiple medications. Pt was found to have acute toxic metabolic encephalopathy and severe rhabdomyolysis. PMH: bipolar, substance abuse disorder, fibromyalgia, schizoaffective disorder, BPD, asthma, OCD, prior overdose and questionable suicidal ideation at outside facility.    PT Comments  Pt report she is cautiously looking forward to discharge to rehab facility tomorrow. Pt states she is getting ready to take a shower. Recommended getting up and collecting clothing and items needed for the shower. Pt able to ambulate short distance without CAM boot with visualization of correct foot placement. Pt reporting increased feeling and exhibits minimal dorsiflexion with ambulation. Once clothing collected pt able to carry to wheelchair. Pt independent for navigation in room with wheelchair including opening door to bathroom. D/c tomorrow.     If plan is discharge home, recommend the following: Help with stairs or ramp for entrance;A little help with walking and/or transfers;Assistance with cooking/housework;Assist for transportation     Equipment Recommendations  None recommended by PT (Friend states today that she can grab needed DME from house.)       Precautions / Restrictions Precautions Precautions: Fall Precaution Comments: R drop foot Required Braces or Orthoses: Other Brace Other Brace: R CAM boot Restrictions Weight Bearing Restrictions: No     Mobility  Bed Mobility Overal bed mobility: Independent             General bed mobility comments: HOB flat    Transfers Overall transfer level: Needs assistance Equipment used: Rolling walker (2 wheels) Transfers: Sit to/from Stand Sit to Stand: Modified independent (Device/Increase time)            General transfer comment: good power up and self steadying    Ambulation/Gait Ambulation/Gait assistance: Supervision Gait Distance (Feet): 10 Feet Assistive device: Rolling walker (2 wheels) Gait Pattern/deviations: Step-to pattern, Decreased weight shift to right, Decreased step length - left Gait velocity: decreased Gait velocity interpretation: <1.31 ft/sec, indicative of household ambulator   General Gait Details: pt with CAM boot off waiting to get shower, able to walk from bed to couch without CAM boot, continues to have poor dorsiflexion but with visualization pt able to place foot correctly and grade weightbearing with UE support on RW, 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 mobility: Yes Wheelchair propulsion: Both upper extremities, Left lower extremity Wheelchair parts: Independent Distance: 15 Wheelchair Assistance Details (indicate cue type and reason): able to propel in tight quarters around obstacles, open door to bathroom and roll to shower bench          Balance Overall balance assessment: Needs assistance Sitting-balance support: No upper extremity supported, Feet supported Sitting balance-Leahy Scale: Good Sitting balance - Comments: able to perform dressing seated on EOB   Standing balance support: Bilateral upper extremity supported, During functional activity Standing balance-Leahy Scale: Fair                              Cognition Arousal: Alert Behavior During Therapy: WFL for tasks assessed/performed, Flat affect Overall Cognitive Status: Within Functional Limits for tasks assessed  General Comments: still flat but looking forward to discharge to drug rehab tomorrow           General Comments General comments (skin integrity, edema, etc.): VSS on RA      Pertinent Vitals/Pain Pain Assessment Pain Assessment: No/denies  pain     PT Goals (current goals can now be found in the care plan section) Acute Rehab PT Goals Patient Stated Goal: to go to rehab PT Goal Formulation: With patient Time For Goal Achievement: 11/04/22 Potential to Achieve Goals: Good Progress towards PT goals: Progressing toward goals    Frequency    Min 1X/week       AM-PAC PT "6 Clicks" Mobility   Outcome Measure  Help needed turning from your back to your side while in a flat bed without using bedrails?: None Help needed moving from lying on your back to sitting on the side of a flat bed without using bedrails?: None Help needed moving to and from a bed to a chair (including a wheelchair)?: None Help needed standing up from a chair using your arms (e.g., wheelchair or bedside chair)?: None Help needed to walk in hospital room?: A Little Help needed climbing 3-5 steps with a railing? : A Lot 6 Click Score: 21    End of Session Equipment Utilized During Treatment: Other (comment) (CAM boot) Activity Tolerance: Patient tolerated treatment well Patient left: with call bell/phone within reach;in chair;with chair alarm set Nurse Communication: Mobility status PT Visit Diagnosis: Unsteadiness on feet (R26.81);Other abnormalities of gait and mobility (R26.89)     Time: 4098-1191 PT Time Calculation (min) (ACUTE ONLY): 15 min  Charges:    $Therapeutic Activity: 8-22 mins PT General Charges $$ ACUTE PT VISIT: 1 Visit                     Carmen Daniels B. Beverely Risen PT, DPT Acute Rehabilitation Services Please use secure chat or  Call Office [phone]    Carmen Daniels [facility] 11/10/2022, 4:09 PM

## 2022-11-11 ENCOUNTER — Other Ambulatory Visit (HOSPITAL_COMMUNITY): Payer: Self-pay

## 2022-11-11 DIAGNOSIS — M6282 Rhabdomyolysis: Secondary | ICD-10-CM | POA: Diagnosis not present

## 2022-11-11 MED ORDER — SULFAMETHOXAZOLE-TRIMETHOPRIM 800-160 MG PO TABS
1.0000 | ORAL_TABLET | Freq: Two times a day (BID) | ORAL | 0 refills | Status: AC
  Filled 2022-11-11 (×2): qty 10, 5d supply, fill #0

## 2022-11-11 MED ORDER — SULFAMETHOXAZOLE-TRIMETHOPRIM 800-160 MG PO TABS
1.0000 | ORAL_TABLET | Freq: Two times a day (BID) | ORAL | Status: DC
  Administered 2022-11-11: 1 via ORAL
  Filled 2022-11-11: qty 1

## 2022-11-11 NOTE — TOC Progression Note (Addendum)
Transition of Care [facility]) - Progression Note    Patient Details  Name: Carmen Daniels MRN: [id number] Date of Birth: 11/09/1985  [facility] CM/SW Contact  Lorri Frederick, LCSW Phone Number: 11/11/2022, 8:42 AM  Clinical Narrative:   CSW informed by MD [facility] is asking about wound.  MD included picture in DC summary, which was faxed to [facility].  CSW received call from [facility] regarding this, [facility] cannot admit pt if wound is draining.  CSW message with MD and RN, wound in not draining.  This information provided to [facility] and they are planning on admission 11am today. Confirmed.   1030: CSW informed that pt stating that [facility] has told her she cannot come via cab.  CSW spoke with [facility] who said pt can arrive by cab.  RNCM communicating with pt.  1050: Cab has arrived, CSW now informed by nurse tech that pt friend Marchelle Folks is going to transport.  CSW walked to main entrance to alert driver and witnessed pt getting into the cab.    Expected Discharge Plan: Home/Self Care ([facility]) Barriers to Discharge:  (pt will d/con Thursday to [facility])  Expected Discharge Plan and Services In-house Referral: Clinical Social Work   Post Acute Care Choice: IP Rehab Living arrangements for the past 2 months: Single Family Home Expected Discharge Date: 11/11/22                                     Social Determinants of Health (SDOH) Interventions SDOH Screenings   Food Insecurity: No Food Insecurity (10/15/2022)  Housing: Low Risk  (10/15/2022)  Transportation Needs: No Transportation Needs (10/15/2022)  Utilities: Not At Risk (10/15/2022)  Alcohol Screen: Low Risk  (04/10/2017)  Depression (PHQ2-9): High Risk (08/04/2022)  Financial Resource Strain: Medium Risk (05/07/2022)   Received from [facility], [facility]  Social Connections: Unknown (06/24/2021)   Received from [facility], [facility]  Stress: Stress Concern Present (05/07/2022)   Received from [facility],  [facility]  Tobacco Use: High Risk (10/15/2022)    Readmission Risk Interventions     No data to display

## 2022-11-11 NOTE — Consult Note (Addendum)
WOC Nurse re-consult note: Refer to previous consult note on 9/16.  Pt is preparing to discharge to a facility which will not take open, draining wounds.  Requested to assess left anterior knee wound.  1.2X1.2 cm dry scabbed wound, 100% yellow/brown, no odor, drainage, or fluctuance.  Discussed with patient that knee wounds are very slow to heal related to the constant movement of skin against the joint.  Topical treatment orders provided for bedside nurses to perform as follows: Leave left knee scab dry and open to air. Please re-consult if further assistance is needed.  Thank-you,  Cammie Mcgee MSN, RN, CWOCN, [place], CNS [phone]

## 2022-11-11 NOTE — TOC Transition Note (Addendum)
Transition of Care [facility]) - CM/SW Discharge Note   Patient Details  Name: Carmen Daniels MRN: [id number] Date of Birth: 05/02/1985  [facility]) CM/SW Contact:  Epifanio Lesches, RN Phone Number: 11/11/2022, 10:50 AM   Clinical Narrative:    Patient will DC to: [facility] Anticipated DC date: 11/11/2022 Transport by: Pennie Banter  Per MD patient ready for DC today. RN, patient, patient's friend Marchelle Folks aware of DC plan. NCM confirmed with [facility] pt will arrive by taxi transportation, family/ friend Marchelle Folks ) states unable to assist with transportation to [facility]. Taxi transportation arranged by CSW, voucher provided to nurse. TOC pharmacy to deliver RX  meds to pt bedside prior to leaving hospital. Post hospital f/u noted on AVS.  11/11/2022 11:30 am  NCM made [employer] case mgr Arvil Persons [phone]) aware of d/c plan.    RNCM will sign off for now as intervention is no longer needed. Please consult [place] again if new needs arise.    Final next level of care: Home/Self Care ([facility]) Barriers to Discharge:  (pt will d/con Thursday to [facility])   Patient Goals and CMS Choice      Discharge Placement                         Discharge Plan and Services Additional resources added to the After Visit Summary for   In-house Referral: Clinical Social Work   Post Acute Care Choice: IP Rehab                               Social Determinants of Health (SDOH) Interventions SDOH Screenings   Food Insecurity: No Food Insecurity (10/15/2022)  Housing: Low Risk  (10/15/2022)  Transportation Needs: No Transportation Needs (10/15/2022)  Utilities: Not At Risk (10/15/2022)  Alcohol Screen: Low Risk  (04/10/2017)  Depression (PHQ2-9): High Risk (08/04/2022)  Financial Resource Strain: Medium Risk (05/07/2022)   Received from [facility], [facility]  Social Connections: Unknown (06/24/2021)   Received from [facility], [facility]  Stress: Stress Concern Present (05/07/2022)    Received from [facility], [facility]  Tobacco Use: High Risk (10/15/2022)     Readmission Risk Interventions     No data to display

## 2022-11-11 NOTE — Discharge Summary (Signed)
PERO     Full                                                        +---------+---------------+---------+-----------+----------+--------------+   +----+---------------+---------+-----------+----------+--------------+ LEFTCompressibilityPhasicitySpontaneityPropertiesThrombus Aging  +----+---------------+---------+-----------+----------+--------------+ CFV Full           Yes      Yes                                 +----+---------------+---------+-----------+----------+--------------+     Summary: RIGHT: - There is no evidence of deep vein thrombosis in the lower extremity.  - No cystic structure found in the popliteal fossa.  LEFT: - No evidence of common femoral vein obstruction.   *See table(s) above for measurements and observations. Electronically signed by Sherald Hess MD on 11/01/2022 at 4:04:41 PM.    Final    CT Angio Chest Pulmonary Embolism (PE) W or WO Contrast  Result Date: 10/31/2022 CLINICAL DATA:  Tachycardia EXAM: CT ANGIOGRAPHY CHEST WITH CONTRAST TECHNIQUE: Multidetector CT imaging of the chest was performed using the standard protocol during bolus administration of intravenous contrast. Multiplanar CT image reconstructions and MIPs were obtained to evaluate the vascular anatomy. RADIATION DOSE REDUCTION: This exam was performed according to the departmental dose-optimization program which includes automated exposure control, adjustment of the mA and/or kV according to patient size and/or use of iterative reconstruction technique. CONTRAST:  75mL OMNIPAQUE IOHEXOL 350 MG/ML SOLN COMPARISON:  CTA chest dated January 20, 2020 FINDINGS: Cardiovascular: Normal heart size. No pericardial effusion. Normal caliber thoracic aorta no atherosclerotic disease. No evidence of pulmonary embolus. Mediastinum/Nodes: Esophagus is unremarkable. Thyroid is unremarkable. No enlarged lymph nodes seen in the chest. Lungs/Pleura: Central airways are patent. No consolidation, pleural effusion or pneumothorax. Upper Abdomen: No acute abnormality. Musculoskeletal: No chest wall abnormality. No acute or significant osseous findings. Review of the MIP images confirms the above findings. IMPRESSION: 1. No evidence of pulmonary embolus. 2. No acute findings in the chest. Electronically  Signed   By: Allegra Lai M.D.   On: 10/31/2022 15:23   VAS [place] ABI WITH/WO TBI  Result Date: 10/31/2022  LOWER EXTREMITY DOPPLER STUDY Patient Name:  Carmen Daniels  Date of Exam:   10/29/2022 Medical Rec #: [id number]       Accession #:    [id number] Date of Birth: August 22, 2037        Patient Gender: F Patient Age:   37 years Exam Location:  [facility] Procedure:      VAS [place] ABI WITH/WO TBI Referring Phys: MICHAEL JEFFERY --------------------------------------------------------------------------------  Indications: "numbness and tingling" High Risk Factors: Current smoker.  Comparison Study: No previous exams Performing Technologist: Hill, Jody RVT, RDMS  Examination Guidelines: A complete evaluation includes at minimum, Doppler waveform signals and systolic blood pressure reading at the level of bilateral brachial, anterior tibial, and posterior tibial arteries, when vessel segments are accessible. Bilateral testing is considered an integral part of a complete examination. Photoelectric Plethysmograph (PPG) waveforms and toe systolic pressure readings are included as required and additional duplex testing as needed. Limited examinations for reoccurring indications may be performed as noted.  ABI Findings: +--------+------------------+-----+---------+--------+ Right   Rt Pressure (mmHg)IndexWaveform Comment  +--------+------------------+-----+---------+--------+ [id number]  PERO     Full                                                        +---------+---------------+---------+-----------+----------+--------------+   +----+---------------+---------+-----------+----------+--------------+ LEFTCompressibilityPhasicitySpontaneityPropertiesThrombus Aging  +----+---------------+---------+-----------+----------+--------------+ CFV Full           Yes      Yes                                 +----+---------------+---------+-----------+----------+--------------+     Summary: RIGHT: - There is no evidence of deep vein thrombosis in the lower extremity.  - No cystic structure found in the popliteal fossa.  LEFT: - No evidence of common femoral vein obstruction.   *See table(s) above for measurements and observations. Electronically signed by Sherald Hess MD on 11/01/2022 at 4:04:41 PM.    Final    CT Angio Chest Pulmonary Embolism (PE) W or WO Contrast  Result Date: 10/31/2022 CLINICAL DATA:  Tachycardia EXAM: CT ANGIOGRAPHY CHEST WITH CONTRAST TECHNIQUE: Multidetector CT imaging of the chest was performed using the standard protocol during bolus administration of intravenous contrast. Multiplanar CT image reconstructions and MIPs were obtained to evaluate the vascular anatomy. RADIATION DOSE REDUCTION: This exam was performed according to the departmental dose-optimization program which includes automated exposure control, adjustment of the mA and/or kV according to patient size and/or use of iterative reconstruction technique. CONTRAST:  75mL OMNIPAQUE IOHEXOL 350 MG/ML SOLN COMPARISON:  CTA chest dated January 20, 2020 FINDINGS: Cardiovascular: Normal heart size. No pericardial effusion. Normal caliber thoracic aorta no atherosclerotic disease. No evidence of pulmonary embolus. Mediastinum/Nodes: Esophagus is unremarkable. Thyroid is unremarkable. No enlarged lymph nodes seen in the chest. Lungs/Pleura: Central airways are patent. No consolidation, pleural effusion or pneumothorax. Upper Abdomen: No acute abnormality. Musculoskeletal: No chest wall abnormality. No acute or significant osseous findings. Review of the MIP images confirms the above findings. IMPRESSION: 1. No evidence of pulmonary embolus. 2. No acute findings in the chest. Electronically  Signed   By: Allegra Lai M.D.   On: 10/31/2022 15:23   VAS [place] ABI WITH/WO TBI  Result Date: 10/31/2022  LOWER EXTREMITY DOPPLER STUDY Patient Name:  Carmen Daniels  Date of Exam:   10/29/2022 Medical Rec #: [id number]       Accession #:    [id number] Date of Birth: August 22, 2037        Patient Gender: F Patient Age:   37 years Exam Location:  [facility] Procedure:      VAS [place] ABI WITH/WO TBI Referring Phys: MICHAEL JEFFERY --------------------------------------------------------------------------------  Indications: "numbness and tingling" High Risk Factors: Current smoker.  Comparison Study: No previous exams Performing Technologist: Hill, Jody RVT, RDMS  Examination Guidelines: A complete evaluation includes at minimum, Doppler waveform signals and systolic blood pressure reading at the level of bilateral brachial, anterior tibial, and posterior tibial arteries, when vessel segments are accessible. Bilateral testing is considered an integral part of a complete examination. Photoelectric Plethysmograph (PPG) waveforms and toe systolic pressure readings are included as required and additional duplex testing as needed. Limited examinations for reoccurring indications may be performed as noted.  ABI Findings: +--------+------------------+-----+---------+--------+ Right   Rt Pressure (mmHg)IndexWaveform Comment  +--------+------------------+-----+---------+--------+ [id number]  of Exam:   11/01/2022 Medical Rec #: [id number]       Accession #:    [id number] Date of Birth: 1985/12/30        Patient Gender: F Patient Age:   64 years Exam Location:  [facility] Procedure:      VAS [place] LOWER EXTREMITY VENOUS (DVT) Referring Phys: Jon Billings Antoine Vandermeulen --------------------------------------------------------------------------------  Indications: Edema.  Limitations: Poor ultrasound/tissue interface. Comparison Study: No significant changes seen since previous exam 07/03/20 Performing Technologist: Shona Simpson  Examination Guidelines: A complete evaluation includes B-mode imaging, spectral Doppler, color Doppler, and power Doppler as needed of all accessible portions of each vessel. Bilateral testing is considered an integral part of  a complete examination. Limited examinations for reoccurring indications may be performed as noted. The reflux portion of the exam is performed with the patient in reverse Trendelenburg.  +---------+---------------+---------+-----------+----------+--------------+ RIGHT    CompressibilityPhasicitySpontaneityPropertiesThrombus Aging +---------+---------------+---------+-----------+----------+--------------+ CFV      Full           Yes      Yes                                 +---------+---------------+---------+-----------+----------+--------------+ SFJ      Full                                                        +---------+---------------+---------+-----------+----------+--------------+ FV Prox  Full                                                        +---------+---------------+---------+-----------+----------+--------------+ FV Mid   Full                                                        +---------+---------------+---------+-----------+----------+--------------+ FV DistalFull                                                        +---------+---------------+---------+-----------+----------+--------------+ PFV      Full                                                        +---------+---------------+---------+-----------+----------+--------------+ POP      Full           Yes      Yes                                 +---------+---------------+---------+-----------+----------+--------------+ PTV      Full                                                        +---------+---------------+---------+-----------+----------+--------------+  PERO     Full                                                        +---------+---------------+---------+-----------+----------+--------------+   +----+---------------+---------+-----------+----------+--------------+ LEFTCompressibilityPhasicitySpontaneityPropertiesThrombus Aging  +----+---------------+---------+-----------+----------+--------------+ CFV Full           Yes      Yes                                 +----+---------------+---------+-----------+----------+--------------+     Summary: RIGHT: - There is no evidence of deep vein thrombosis in the lower extremity.  - No cystic structure found in the popliteal fossa.  LEFT: - No evidence of common femoral vein obstruction.   *See table(s) above for measurements and observations. Electronically signed by Sherald Hess MD on 11/01/2022 at 4:04:41 PM.    Final    CT Angio Chest Pulmonary Embolism (PE) W or WO Contrast  Result Date: 10/31/2022 CLINICAL DATA:  Tachycardia EXAM: CT ANGIOGRAPHY CHEST WITH CONTRAST TECHNIQUE: Multidetector CT imaging of the chest was performed using the standard protocol during bolus administration of intravenous contrast. Multiplanar CT image reconstructions and MIPs were obtained to evaluate the vascular anatomy. RADIATION DOSE REDUCTION: This exam was performed according to the departmental dose-optimization program which includes automated exposure control, adjustment of the mA and/or kV according to patient size and/or use of iterative reconstruction technique. CONTRAST:  75mL OMNIPAQUE IOHEXOL 350 MG/ML SOLN COMPARISON:  CTA chest dated January 20, 2020 FINDINGS: Cardiovascular: Normal heart size. No pericardial effusion. Normal caliber thoracic aorta no atherosclerotic disease. No evidence of pulmonary embolus. Mediastinum/Nodes: Esophagus is unremarkable. Thyroid is unremarkable. No enlarged lymph nodes seen in the chest. Lungs/Pleura: Central airways are patent. No consolidation, pleural effusion or pneumothorax. Upper Abdomen: No acute abnormality. Musculoskeletal: No chest wall abnormality. No acute or significant osseous findings. Review of the MIP images confirms the above findings. IMPRESSION: 1. No evidence of pulmonary embolus. 2. No acute findings in the chest. Electronically  Signed   By: Allegra Lai M.D.   On: 10/31/2022 15:23   VAS [place] ABI WITH/WO TBI  Result Date: 10/31/2022  LOWER EXTREMITY DOPPLER STUDY Patient Name:  Carmen Daniels  Date of Exam:   10/29/2022 Medical Rec #: [id number]       Accession #:    [id number] Date of Birth: August 22, 2037        Patient Gender: F Patient Age:   37 years Exam Location:  [facility] Procedure:      VAS [place] ABI WITH/WO TBI Referring Phys: MICHAEL JEFFERY --------------------------------------------------------------------------------  Indications: "numbness and tingling" High Risk Factors: Current smoker.  Comparison Study: No previous exams Performing Technologist: Hill, Jody RVT, RDMS  Examination Guidelines: A complete evaluation includes at minimum, Doppler waveform signals and systolic blood pressure reading at the level of bilateral brachial, anterior tibial, and posterior tibial arteries, when vessel segments are accessible. Bilateral testing is considered an integral part of a complete examination. Photoelectric Plethysmograph (PPG) waveforms and toe systolic pressure readings are included as required and additional duplex testing as needed. Limited examinations for reoccurring indications may be performed as noted.  ABI Findings: +--------+------------------+-----+---------+--------+ Right   Rt Pressure (mmHg)IndexWaveform Comment  +--------+------------------+-----+---------+--------+ [id number]  of Exam:   11/01/2022 Medical Rec #: [id number]       Accession #:    [id number] Date of Birth: 1985/12/30        Patient Gender: F Patient Age:   64 years Exam Location:  [facility] Procedure:      VAS [place] LOWER EXTREMITY VENOUS (DVT) Referring Phys: Jon Billings Antoine Vandermeulen --------------------------------------------------------------------------------  Indications: Edema.  Limitations: Poor ultrasound/tissue interface. Comparison Study: No significant changes seen since previous exam 07/03/20 Performing Technologist: Shona Simpson  Examination Guidelines: A complete evaluation includes B-mode imaging, spectral Doppler, color Doppler, and power Doppler as needed of all accessible portions of each vessel. Bilateral testing is considered an integral part of  a complete examination. Limited examinations for reoccurring indications may be performed as noted. The reflux portion of the exam is performed with the patient in reverse Trendelenburg.  +---------+---------------+---------+-----------+----------+--------------+ RIGHT    CompressibilityPhasicitySpontaneityPropertiesThrombus Aging +---------+---------------+---------+-----------+----------+--------------+ CFV      Full           Yes      Yes                                 +---------+---------------+---------+-----------+----------+--------------+ SFJ      Full                                                        +---------+---------------+---------+-----------+----------+--------------+ FV Prox  Full                                                        +---------+---------------+---------+-----------+----------+--------------+ FV Mid   Full                                                        +---------+---------------+---------+-----------+----------+--------------+ FV DistalFull                                                        +---------+---------------+---------+-----------+----------+--------------+ PFV      Full                                                        +---------+---------------+---------+-----------+----------+--------------+ POP      Full           Yes      Yes                                 +---------+---------------+---------+-----------+----------+--------------+ PTV      Full                                                        +---------+---------------+---------+-----------+----------+--------------+  Physician Discharge Summary   Patient: Carmen Daniels MRN: [id number] DOB: 20-Dec-1985  Admit date:     10/14/2022  Discharge date: 11/11/22  Discharge Physician: Alba Cory   PCP: Salli Real, MD   Recommendations at discharge:    Follow up with Ortho.  Complete 5 days antibiotics.   Discharge Diagnoses: Principal Problem:   Rhabdomyolysis Active Problems:   AMS (altered mental status)   Non-traumatic rhabdomyolysis   Encephalopathy, toxic   Polysubstance abuse (HCC)   Severe bipolar I disorder, most recent episode depressed (HCC)   Other bipolar disorder (HCC)  Resolved Problems:   * No resolved hospital problems. *  Hospital Course: 38 year old with medical history significant for bipolar, substance abuse disorder, prior overdose and questionable suicidal ideation at outside facility.  She presents to the ED after being found down by family.  Nearby there was noted  vomit and  multiple medications.  Estimated downtime approximately 20 hours.  Patient was admitted with concern of overdose and subsequent withdrawal, hospitalist was called for admission.  On admission she was found to have acute toxic metabolic encephalopathy and severe rhabdomyolysis.  She was started on aggressive IV fluids.  Subsequently rhabdo resolved.  She was evaluated by psychiatry who initially recommended inpatient psych admission.  Reevaluated by psych on 9/3 patient does not require inpatient psych admission, they recommend home health PT or skilled rehab.  She completed  10  days of doxycycline for multiple sites of small areas of cellulitis.  There was concern for right lower extremity peroneal nerve palsy.  AFO boot ordered.   Patient for discharge 9/19 to [facility].  Assessment & Plan:   Principal Problem:   Rhabdomyolysis Active Problems:   Bipolar disorder (HCC)   AMS (altered mental status)   Non-traumatic rhabdomyolysis   Encephalopathy, toxic   1-Acute toxic encephalopathy, POA History of  substance abuse disorder Concern for overdose, accidental versus intentional Toxic encephalopathy due to multidrug overdose. She has been  clear by psychiatric for outpatient care At baseline. Oriented times 3.    Bipolar disorder, borderline personality disorder: Trazodone as needed, on [place] Had episode of anxiety, started on short course alprazolam.  Tachycardia resolved.    Rhabdomyolysis, nontraumatic CK level normalized.  Treated with IV fluids Resolved.    -Right lower extremity erythema mild fluid-filled lesion improving Lower extremity palsy, right lower extremity peroneal palsy compression Patient with neuropathic pain, continue with Cymbalta and Neurontin Completed  10 days of doxy for multiples sites cellulitis.  MRI of the Lumbar Spine showed Chronic Lumbar arthropathy She might benefit from outpatient EMG. Patient to use AFO She has been seen previously by Dr. Susa Simmonds for bimalleolar fracture December 2023. Appreciate ortho evaluation. ABI normal.  Needs to work with PT>    Will add keflex for mild cellulitis Will get wound care   Tachycardia: resolved.  Treated  with IV fluids D dimer elevated. CTA chest negative for PE, Doppler negative for DVT TSH normal.    Hypokalemia/hypophosphatemia/hypomagnesemia: Replaced   Lactic Acidosis: Resolved with fluids   Diarrhea; started  florastore, bentyl PRN. Resolved.      Migraine: resume home imiprex PRN Estimated body mass index is 24.38 kg/m as calculated from the following:   Height as of this encounter: 5\' 11"  (1.803 m).   Weight as of this encounter: 79.3 kg.      DVT prophylaxis: Lovenox Code Status:    Full Code Family Communication: Marchelle Folks updated over the phone, and boyfriend at bedside.  of Exam:   11/01/2022 Medical Rec #: [id number]       Accession #:    [id number] Date of Birth: 1985/12/30        Patient Gender: F Patient Age:   64 years Exam Location:  [facility] Procedure:      VAS [place] LOWER EXTREMITY VENOUS (DVT) Referring Phys: Jon Billings Antoine Vandermeulen --------------------------------------------------------------------------------  Indications: Edema.  Limitations: Poor ultrasound/tissue interface. Comparison Study: No significant changes seen since previous exam 07/03/20 Performing Technologist: Shona Simpson  Examination Guidelines: A complete evaluation includes B-mode imaging, spectral Doppler, color Doppler, and power Doppler as needed of all accessible portions of each vessel. Bilateral testing is considered an integral part of  a complete examination. Limited examinations for reoccurring indications may be performed as noted. The reflux portion of the exam is performed with the patient in reverse Trendelenburg.  +---------+---------------+---------+-----------+----------+--------------+ RIGHT    CompressibilityPhasicitySpontaneityPropertiesThrombus Aging +---------+---------------+---------+-----------+----------+--------------+ CFV      Full           Yes      Yes                                 +---------+---------------+---------+-----------+----------+--------------+ SFJ      Full                                                        +---------+---------------+---------+-----------+----------+--------------+ FV Prox  Full                                                        +---------+---------------+---------+-----------+----------+--------------+ FV Mid   Full                                                        +---------+---------------+---------+-----------+----------+--------------+ FV DistalFull                                                        +---------+---------------+---------+-----------+----------+--------------+ PFV      Full                                                        +---------+---------------+---------+-----------+----------+--------------+ POP      Full           Yes      Yes                                 +---------+---------------+---------+-----------+----------+--------------+ PTV      Full                                                        +---------+---------------+---------+-----------+----------+--------------+

## 2022-11-11 NOTE — Plan of Care (Signed)

## 2022-11-11 NOTE — Care Plan (Signed)
Patient discharged to [facility]. Patient alert and able to make needs known. Discharge medications obtained from [facility] pharmacy and given to patient. Discharge instructions including follow-up appointment explained to patient. Patient left unit via w/c down to taxi awaiting.

## 2022-11-17 ENCOUNTER — Ambulatory Visit: Payer: MEDICAID | Admitting: Neurology

## 2022-11-18 ENCOUNTER — Other Ambulatory Visit (HOSPITAL_COMMUNITY): Payer: Self-pay

## 2022-12-08 ENCOUNTER — Telehealth: Payer: Self-pay | Admitting: Neurology

## 2022-12-08 MED ORDER — ONABOTULINUMTOXINA 200 UNITS IJ SOLR: 200.0000 [IU] | INTRAMUSCULAR | 0 refills | Status: AC

## 2022-12-08 NOTE — Telephone Encounter (Signed)
Per her previous notes, she has been getting 200 units for migraines.

## 2022-12-08 NOTE — Telephone Encounter (Signed)
Pt called would like to schedule a Botox appt. Please call new number [phone]

## 2022-12-08 NOTE — Addendum Note (Signed)
Addended by: Danne Harbor on: 12/08/2022 02:39 PM   Modules accepted: Orders

## 2022-12-08 NOTE — Telephone Encounter (Signed)
I called pt back and scheduled Botox appt. Pt states she was just in the hospital for a month and while she was there the doctor had recommended a nerve conduction study. Would we be able to go ahead and order/schedule that for her?

## 2022-12-08 NOTE — Telephone Encounter (Signed)
How much botox?

## 2022-12-08 NOTE — Telephone Encounter (Signed)
Ordered as requested.

## 2022-12-08 NOTE — Telephone Encounter (Signed)
Please also send Botox rx to [facility].

## 2022-12-20 NOTE — Telephone Encounter (Signed)
Delivery is pending pt's consent, I called and LVM and sent her a text asking her to call [facility] @ [phone].

## 2022-12-21 NOTE — Telephone Encounter (Signed)
I was able to speak with pt and advised her to call [facility]. I called [facility] and set delivery up for 10/31, we will use office stock for 10/30 appt and replace.

## 2022-12-22 ENCOUNTER — Ambulatory Visit: Payer: MEDICAID | Admitting: Neurology

## 2022-12-22 ENCOUNTER — Telehealth: Payer: Self-pay | Admitting: Neurology

## 2022-12-22 NOTE — Telephone Encounter (Signed)
Pt LVM on 12/21/22 at 4:52pm cancel Botox appointment, will not be able to come and will call back to reschedule.

## 2022-12-24 ENCOUNTER — Encounter (HOSPITAL_COMMUNITY): Payer: Self-pay | Admitting: Psychiatry

## 2022-12-24 ENCOUNTER — Telehealth (INDEPENDENT_AMBULATORY_CARE_PROVIDER_SITE_OTHER): Payer: MEDICAID | Admitting: Psychiatry

## 2022-12-24 DIAGNOSIS — F99 Mental disorder, not otherwise specified: Secondary | ICD-10-CM

## 2022-12-24 DIAGNOSIS — F5105 Insomnia due to other mental disorder: Secondary | ICD-10-CM

## 2022-12-24 DIAGNOSIS — F319 Bipolar disorder, unspecified: Secondary | ICD-10-CM | POA: Diagnosis not present

## 2022-12-24 MED ORDER — HALOPERIDOL 5 MG PO TABS
5.0000 mg | ORAL_TABLET | Freq: Every day | ORAL | 3 refills | Status: AC
Start: 2022-12-24 — End: ?

## 2022-12-24 MED ORDER — DULOXETINE HCL 60 MG PO CPEP: 120.0000 mg | ORAL_CAPSULE | Freq: Every day | ORAL | 1 refills | Status: AC

## 2022-12-24 MED ORDER — LUMATEPERONE TOSYLATE 42 MG PO CAPS: 42.0000 mg | ORAL_CAPSULE | Freq: Every day | ORAL | 0 refills | Status: AC

## 2022-12-24 MED ORDER — TRAZODONE HCL 100 MG PO TABS
100.0000 mg | ORAL_TABLET | Freq: Every day | ORAL | 3 refills | Status: AC
Start: 2022-12-24 — End: ?

## 2022-12-24 NOTE — Progress Notes (Signed)
BH MD/PA/NP OP Progress Note Virtual Visit via Video Note  I connected with Carmen Daniels on 12/24/22 at 11:00 AM EDT by a video enabled telemedicine application and verified that I am speaking with the correct person using two identifiers.  Location: Patient: Home Provider: Clinic   I discussed the limitations of evaluation and management by telemedicine and the availability of in person appointments. The patient expressed understanding and agreed to proceed.  I provided 30 minutes of non-face-to-face time during this encounter.    12/24/2022 11:47 AM Carmen Daniels  MRN:  [id number]  Chief Complaint: "I have been without my medications and I'm manic"     HPI: 37 year-old female seen today for follow-up psychiatric evaluation. She has a psychiatric history of bipolar disorder, borderline personality disorder, anxiety, depression, and SI/SA.   She was recently hospitalized at [facility] hospitial from 10/14/2022-11/10/2020 for  concern of overdose and subsequent withdrawal.  Per chart review she was found to have acute toxic metabolic encephalopathy and severe rhabdomyolysis. Patients trazodone and Haldol were discontinued. Currently she is managed on Cymbalta 120 mg daily, Caplyta 42 mg daily.  Patient is also prescribed gabapentin 300 mg which are all prescribed by her PCP.   She reports that her medications are somewhat effective in managing her psychiatric conditions.  Today she was well-groomed, pleasant, cooperative, engaged in conversation.  She informed Clinical research associate that she has been without her medications and now is manic. Provider asked patient if she was without all of her medications. She notes that she has just been without her trazodone and haldol. Patient notes that she has been experiencing hypo mania. She describes racing thoughts, fluctuations in mood, irritability, distractable, and paranoia. Provider asked patient if she was using illegal substances. 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 Patient notes that she was in rehab for over a month.  Since her last visit she notes her and depression has increased. Today provider conducted a GAD 7 and patient scored a 20, at her last visit she scored a 16. Patient notes that she is worried about her father who has Alzheimer's. She notes that recently he fell in a pool without water and is now in a memory unit in [place]. She reports that she is uncertain how much time he has left. Patient notes that her mother has been living in [place] to be close to her father. Provider also conducted PHQ-9 the patient scored a 23, at her last visit she scored a 12.  She endorsed adequate appetite.  Today she denies SI/HI/AVH.   Today Haldol patient agreeable to restarting Haldol 5 mg to help manage mood and paranoia.  She is also agreeable to restarting trazodone 100 mg nightly as needed to help manage sleep.  She will continue other medications as prescribed.   No other concerns noted at this time.        Visit Diagnosis:    ICD-10-CM   1. Insomnia related to another mental disorder  F51.05 traZODone (DESYREL) 100 MG tablet    2. Bipolar 1 disorder, depressed (HCC)  F31.9 lumateperone tosylate (CAPLYTA) 42 MG capsule    DULoxetine (CYMBALTA) 60 MG capsule    haloperidol (HALDOL) 5 MG tablet         Past Psychiatric History: bipolar disorder, borderline personality disorder, anxiety, depression, and SI/SA Past Medical History:  Past Medical History:  Diagnosis Date   Allergic rhinitis  Anxiety    Arthritis    Asthma    Bipolar disorder (HCC)    Borderline personality disorder (HCC)    Bupropion overdose    Chronic pain syndrome    Depression    Family history of adverse reaction to anesthesia    Mom - N/V   Fibromyalgia    generalized   Hallucinations    02/08/22- have cleared mom said.   Migraines    Nasal polyps    OCD (obsessive  compulsive disorder)    Ovarian cyst    left   Pneumonia    2021   PONV (postoperative nausea and vomiting)    Schizophrenia (HCC)    schizoaffective   Seizures (HCC)    patient attenped sudide by tak alot of her medication,.    Past Surgical History:  Procedure Laterality Date   ABDOMINAL HYSTERECTOMY     LAPAROSCOPIC TUBAL LIGATION Bilateral 06/13/2019   Procedure: LAPAROSCOPIC TUBAL LIGATION;  Surgeon: Hermina Staggers, MD;  Location: [facility];  Service: Gynecology;  Laterality: Bilateral;  FILSHIE CLIPS   NASAL SINUS SURGERY     ORIF ANKLE FRACTURE Right 02/09/2022   Procedure: OPEN TREATMENT OF RIGHT BIMALLEOLAR ANKLE FRACTURE;  Surgeon: Terance Hart, MD;  Location: [facility] OR;  Service: Orthopedics;  Laterality: Right;   TUBAL LIGATION     tubes in ears     VAGINAL HYSTERECTOMY Bilateral 01/15/2020   Procedure: HYSTERECTOMY VAGINAL;  Surgeon: Hermina Staggers, MD;  Location: [facility] OR;  Service: Gynecology;  Laterality: Bilateral;   WISDOM TOOTH EXTRACTION      Family Psychiatric History: Paternal grandmother depression Family History:  Family History  Problem Relation Age of Onset   Healthy Mother    Healthy Father    Arthritis Maternal Grandmother    Arthritis Maternal Grandfather    Depression Paternal Grandmother    Hyperlipidemia Other    Stroke Other    Hypertension Other    Cancer Other    COPD Other    Asthma Other     Social History:  Social History   Socioeconomic History   Marital status: Married    Spouse name: Not on file   Number of children: 0   Years of education: 14   Highest education level: Not on file  Occupational History   Occupation: Unemployed  Tobacco Use   Smoking status: Every Day    Current packs/day: 0.50    Average packs/day: 0.5 packs/day for 15.0 years (7.5 ttl pk-yrs)    Types: Cigarettes   Smokeless tobacco: Never  Vaping Use   Vaping status: Every Day   Substances: Nicotine  Substance and Sexual  Activity   Alcohol use: Never   Drug use: Yes    Comment: prescribed narcotics   Sexual activity: Not on file  Other Topics Concern   Not on file  Social History Narrative   Lives alone   Right-handed.   3 glasses of green tea per day.   Social Determinants of Health   Financial Resource Strain: Medium Risk (05/07/2022)   Received [facility]   Overall Financial Resource Strain (CARDIA)    Difficulty of Paying Living Expenses: Somewhat hard  Food Insecurity: No Food Insecurity (10/15/2022)   Hunger Vital Sign    Worried About Running Out of Food in the Last Year: Never true    Ran Out of Food in the Last Year: Never true  Transportation Needs: No Transportation Needs (10/15/2022)   PRAPARE -  Administrator, Civil Service (Medical): No    Lack of Transportation (Non-Medical): No  Physical Activity: Not on file  Stress: Stress Concern Present (05/07/2022)   Received [facility] - Occupational Stress Questionnaire    Feeling of Stress : Very much  Social Connections: Unknown (06/24/2021)   Received from [facility], [facility]   Social Network    Social Network: Not on file    Allergies:  Allergies  Allergen Reactions   Keflex [Cephalexin] Anaphylaxis    Has tolerated amoxicillin 2019/2020    Metabolic Disorder Labs: Lab Results  Component Value Date   HGBA1C 4.7 (L) 02/07/2021   MPG 88.19 02/07/2021   No results found for: "PROLACTIN" Lab Results  Component Value Date   TRIG 341 (H) 04/03/2019   Lab Results  Component Value Date   TSH 2.281 11/10/2022   TSH 0.972 02/15/2021    Therapeutic Level Labs: Lab Results  Component Value Date   LITHIUM 0.4 (L) 04/24/2021   LITHIUM <0.06 (L) 07/30/2019   No results found for: "VALPROATE" No results found for: "CBMZ"  Current Medications: Current Outpatient Medications  Medication Sig Dispense Refill    haloperidol (HALDOL) 5 MG tablet Take 1 tablet (5 mg total) by mouth daily. 30 tablet 3   traZODone (DESYREL) 100 MG tablet Take 1 tablet (100 mg total) by mouth at bedtime. 30 tablet 3   beclomethasone (QVAR) 40 MCG/ACT inhaler Inhale 1 puff into the lungs 2 (two) times daily as needed (asthma). 10.6 g 12   botulinum toxin Type A (BOTOX) 200 units injection Inject 200 Units into the muscle every 3 (three) months. 1 each 0   cyanocobalamin (VITAMIN B12) 1000 MCG tablet Take 0.5 tablets (500 mcg total) by mouth daily. 15 tablet 1   DULoxetine (CYMBALTA) 60 MG capsule Take 2 capsules (120 mg total) by mouth daily. 60 capsule 1   gabapentin (NEURONTIN) 300 MG capsule Take 1 capsule (300 mg total) by mouth 3 (three) times daily. 90 capsule 0   lumateperone tosylate (CAPLYTA) 42 MG capsule Take 1 capsule (42 mg total) by mouth daily. 30 capsule 0   Multiple Vitamins-Calcium (ONE-A-DAY WOMENS FORMULA PO) Take 1 tablet by mouth daily.     neomycin-bacitracin-polymyxin (NEOSPORIN) OINT Apply 1 Application topically 2 (two) times daily. 28.4 g 0   SUMAtriptan (IMITREX) 100 MG tablet May take 1 tablet by mouth as needed for headache; may repeat in 2 hours if headache persists or recurs;  MAX 200 mg/24 hours. Do not refill in less than 30 days. 9 tablet 11   No current facility-administered medications for this visit.     Musculoskeletal: Strength & Muscle Tone: within normal limits and telehealth visit Gait & Station: normal, telehealth visit Patient leans: N/A  Psychiatric Specialty Exam: Review of Systems  Psychiatric/Behavioral:  Negative for confusion.     There were no vitals taken for this visit.There is no height or weight on file to calculate BMI.  General Appearance: Well Groomed  Eye Contact:  Good  Speech:  Clear and Coherent and Normal Rate  Volume:  Normal  Mood:  Anxious and Depressed  Affect:  Congruent  Thought Process:  Coherent, Goal Directed and Linear  Orientation:  Full  (Time, Place, and Person)  Thought Content: WDL and Logical   Suicidal Thoughts:  No  Homicidal Thoughts:  No  Memory:  Immediate;   Good Recent;   Good Remote;  Good  Judgement:  Good  Insight:  Good  Psychomotor Activity:  Normal  Concentration:  Concentration: Good and Attention Span: Good  Recall:  Good  Fund of Knowledge: Good  Language: Good  Akathisia:  No  Handed:  Right  AIMS (if indicated):Done  Assets:  Communication Skills Desire for Improvement Financial Resources/Insurance Housing Social Support  ADL's:  Intact  Cognition: WNL  Sleep:  Good   Screenings: AIMS    Flowsheet Row Clinical Support from 11/26/2019 in [facility]  AIMS Total Score 5 0      GAD-7    Flowsheet Row Video Visit from 12/24/2022 in [facility] Video Visit from 08/04/2022 in [facility] Office Visit from 05/17/2022 in [facility] Office Visit from 08/26/2021 in [facility] Video Visit from 10/30/2020 in [facility]  Total GAD-7 Score 20 16 13 17 20       Mini-Mental    Flowsheet Row Office Visit from 10/05/2016 in [facility]  Total Score (max 30 points ) 26      PHQ2-9    Flowsheet Row Video Visit from 12/24/2022 in [facility] Video Visit from 08/04/2022 in [facility] Office Visit from 05/17/2022 in [facility] Office Visit from 08/26/2021 in [facility] Nutrition from 10/29/2020 in [facility] Total Score 6 4 1 6 3   PHQ-9 Total Score 23 12 6 19 18       Flowsheet Row ED to Hosp-Admission (Discharged) from 10/14/2022 in [facility] ED from 06/04/2022 in [facility] Emergency Department at [facility] Admission (Discharged) from 02/09/2022 in [facility]  C-SSRS RISK CATEGORY No Risk No Risk Moderate Risk        Assessment and Plan: Patient endorse hypomania, anxiety and depression.  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 Today Haldol patient agreeable to restarting Haldol 5 mg to help manage mood and paranoia.  She is also agreeable to restarting trazodone 100 mg nightly as needed to help manage sleep.  She will continue other medications as prescribed  1. Bipolar 1 disorder, depressed (HCC)  Continue- lumateperone tosylate (CAPLYTA) 42 MG capsule; Take 1 capsule (42 mg total) by mouth daily.  Dispense: 30 capsule; Refill: 0 Continue- DULoxetine (CYMBALTA) 60 MG capsule; Take 2 capsules (120 mg total) by mouth daily.  Dispense: 60 capsule; Refill: 1 Restart- haloperidol (HALDOL) 5 MG tablet; Take 1 tablet (5 mg total) by mouth daily.  Dispense: 30 tablet; Refill: 3  2. Insomnia related to another mental disorder  Restart- traZODone (DESYREL) 100 MG tablet; Take 1 tablet (100 mg total) by mouth at bedtime.  Dispense: 30 tablet; Refill: 3    Follow-up in 3 months. Follow-up with outpatient therapy   Shanna Cisco, NP 12/24/2022, 11:47 AM

## 2022-12-27 NOTE — Telephone Encounter (Signed)
Pt cancelled 12/22/22 appt without rescheduling. We received Botox on 12/23/22.

## 2023-02-02 ENCOUNTER — Other Ambulatory Visit: Payer: Self-pay | Admitting: Neurology

## 2023-02-02 NOTE — Telephone Encounter (Signed)
Meds ordered this encounter  Medications   SUMAtriptan (IMITREX) 100 MG tablet    Sig: TAKE 1 TABLET BY MOUTH AT ONSET OF HEADACHE. MAY REPEAT DOSE IN 2 HOURS IF HEADACHE PERSISTS OR RECURS. DO NOT REFILL IN LESS THAN 30 DAYS    Dispense:  10 tablet    Refill:  11

## 2023-02-09 ENCOUNTER — Encounter: Payer: Self-pay | Admitting: Neurology

## 2023-02-09 ENCOUNTER — Ambulatory Visit: Payer: MEDICAID | Admitting: Neurology

## 2023-02-28 ENCOUNTER — Telehealth: Payer: Self-pay | Admitting: *Deleted

## 2023-02-28 NOTE — Telephone Encounter (Signed)
 Patient has not been seen since 08/25/2022. Does not have a follow up appointment on file. Refill will not be reordered at this time.

## 2023-03-09 ENCOUNTER — Telehealth (HOSPITAL_COMMUNITY): Payer: MEDICAID | Admitting: Psychiatry

## 2023-03-09 ENCOUNTER — Encounter (HOSPITAL_COMMUNITY): Payer: Self-pay

## 2023-03-16 ENCOUNTER — Ambulatory Visit: Payer: MEDICAID | Admitting: Neurology

## 2023-05-11 ENCOUNTER — Ambulatory Visit: Payer: MEDICAID | Admitting: Neurology

## 2023-09-23 DEATH — deceased
